# Patient Record
Sex: Female | Born: 1965 | Hispanic: No | Marital: Married | Smoking: Never smoker
Health system: Southern US, Community
[De-identification: ages and names within clinical notes are randomized; demographics above are authoritative.]

## PROBLEM LIST (undated history)

## (undated) ENCOUNTER — Emergency Department (HOSPITAL_COMMUNITY): Payer: BC Managed Care – PPO

## (undated) DIAGNOSIS — E785 Hyperlipidemia, unspecified: Secondary | ICD-10-CM

## (undated) DIAGNOSIS — F329 Major depressive disorder, single episode, unspecified: Secondary | ICD-10-CM

## (undated) DIAGNOSIS — F312 Bipolar disorder, current episode manic severe with psychotic features: Secondary | ICD-10-CM

## (undated) DIAGNOSIS — N390 Urinary tract infection, site not specified: Secondary | ICD-10-CM

## (undated) DIAGNOSIS — I890 Lymphedema, not elsewhere classified: Secondary | ICD-10-CM

## (undated) DIAGNOSIS — G4730 Sleep apnea, unspecified: Secondary | ICD-10-CM

## (undated) DIAGNOSIS — I517 Cardiomegaly: Secondary | ICD-10-CM

## (undated) DIAGNOSIS — F32A Depression, unspecified: Secondary | ICD-10-CM

## (undated) DIAGNOSIS — R7303 Prediabetes: Secondary | ICD-10-CM

## (undated) DIAGNOSIS — M1990 Unspecified osteoarthritis, unspecified site: Secondary | ICD-10-CM

## (undated) DIAGNOSIS — I739 Peripheral vascular disease, unspecified: Secondary | ICD-10-CM

## (undated) DIAGNOSIS — E213 Hyperparathyroidism, unspecified: Secondary | ICD-10-CM

## (undated) DIAGNOSIS — F419 Anxiety disorder, unspecified: Secondary | ICD-10-CM

## (undated) DIAGNOSIS — K5140 Inflammatory polyps of colon without complications: Secondary | ICD-10-CM

## (undated) DIAGNOSIS — B019 Varicella without complication: Secondary | ICD-10-CM

## (undated) DIAGNOSIS — E559 Vitamin D deficiency, unspecified: Secondary | ICD-10-CM

## (undated) DIAGNOSIS — H409 Unspecified glaucoma: Secondary | ICD-10-CM

## (undated) DIAGNOSIS — E079 Disorder of thyroid, unspecified: Secondary | ICD-10-CM

## (undated) DIAGNOSIS — E282 Polycystic ovarian syndrome: Secondary | ICD-10-CM

## (undated) DIAGNOSIS — K5792 Diverticulitis of intestine, part unspecified, without perforation or abscess without bleeding: Secondary | ICD-10-CM

## (undated) DIAGNOSIS — I10 Essential (primary) hypertension: Secondary | ICD-10-CM

## (undated) DIAGNOSIS — I499 Cardiac arrhythmia, unspecified: Secondary | ICD-10-CM

## (undated) DIAGNOSIS — D649 Anemia, unspecified: Secondary | ICD-10-CM

## (undated) DIAGNOSIS — K219 Gastro-esophageal reflux disease without esophagitis: Secondary | ICD-10-CM

## (undated) HISTORY — DX: Unspecified glaucoma: H40.9

## (undated) HISTORY — DX: Urinary tract infection, site not specified: N39.0

## (undated) HISTORY — DX: Major depressive disorder, single episode, unspecified: F32.9

## (undated) HISTORY — DX: Peripheral vascular disease, unspecified: I73.9

## (undated) HISTORY — DX: Cardiac arrhythmia, unspecified: I49.9

## (undated) HISTORY — DX: Bipolar disorder, current episode manic severe with psychotic features: F31.2

## (undated) HISTORY — DX: Hyperparathyroidism, unspecified: E21.3

## (undated) HISTORY — DX: Hyperlipidemia, unspecified: E78.5

## (undated) HISTORY — DX: Anxiety disorder, unspecified: F41.9

## (undated) HISTORY — PX: INNER EAR SURGERY: SHX679

## (undated) HISTORY — DX: Anemia, unspecified: D64.9

## (undated) HISTORY — DX: Prediabetes: R73.03

## (undated) HISTORY — PX: LAPAROSCOPIC REPAIR AND REMOVAL OF GASTRIC BAND: SHX5919

## (undated) HISTORY — PX: TONSILLECTOMY AND ADENOIDECTOMY: SUR1326

## (undated) HISTORY — DX: Diverticulitis of intestine, part unspecified, without perforation or abscess without bleeding: K57.92

## (undated) HISTORY — DX: Unspecified osteoarthritis, unspecified site: M19.90

## (undated) HISTORY — DX: Essential (primary) hypertension: I10

## (undated) HISTORY — DX: Lymphedema, not elsewhere classified: I89.0

## (undated) HISTORY — DX: Cardiomegaly: I51.7

## (undated) HISTORY — DX: Depression, unspecified: F32.A

## (undated) HISTORY — DX: Inflammatory polyps of colon without complications: K51.40

## (undated) HISTORY — DX: Disorder of thyroid, unspecified: E07.9

## (undated) HISTORY — DX: Varicella without complication: B01.9

## (undated) HISTORY — DX: Gastro-esophageal reflux disease without esophagitis: K21.9

## (undated) HISTORY — DX: Sleep apnea, unspecified: G47.30

## (undated) HISTORY — PX: OOPHORECTOMY: SHX86

## (undated) HISTORY — DX: Polycystic ovarian syndrome: E28.2

## (undated) HISTORY — DX: Vitamin D deficiency, unspecified: E55.9

## (undated) HISTORY — PX: CHOLECYSTECTOMY: SHX55

---

## 1990-05-01 DIAGNOSIS — I10 Essential (primary) hypertension: Secondary | ICD-10-CM | POA: Insufficient documentation

## 2012-08-28 DIAGNOSIS — M488X9 Other specified spondylopathies, site unspecified: Secondary | ICD-10-CM | POA: Insufficient documentation

## 2013-05-01 HISTORY — PX: BREAST BIOPSY: SHX20

## 2014-09-21 LAB — HM MAMMOGRAPHY

## 2017-04-09 ENCOUNTER — Institutional Professional Consult (permissible substitution): Payer: Self-pay | Admitting: Pulmonary Disease

## 2017-05-11 DIAGNOSIS — D649 Anemia, unspecified: Secondary | ICD-10-CM | POA: Insufficient documentation

## 2017-05-11 DIAGNOSIS — I499 Cardiac arrhythmia, unspecified: Secondary | ICD-10-CM | POA: Insufficient documentation

## 2017-05-11 DIAGNOSIS — K219 Gastro-esophageal reflux disease without esophagitis: Secondary | ICD-10-CM | POA: Insufficient documentation

## 2017-05-11 DIAGNOSIS — M542 Cervicalgia: Secondary | ICD-10-CM | POA: Insufficient documentation

## 2017-05-11 DIAGNOSIS — K5790 Diverticulosis of intestine, part unspecified, without perforation or abscess without bleeding: Secondary | ICD-10-CM | POA: Insufficient documentation

## 2017-05-11 DIAGNOSIS — E282 Polycystic ovarian syndrome: Secondary | ICD-10-CM | POA: Insufficient documentation

## 2017-05-11 DIAGNOSIS — G4733 Obstructive sleep apnea (adult) (pediatric): Secondary | ICD-10-CM | POA: Insufficient documentation

## 2017-05-11 DIAGNOSIS — R600 Localized edema: Secondary | ICD-10-CM | POA: Insufficient documentation

## 2017-05-11 DIAGNOSIS — Z9989 Dependence on other enabling machines and devices: Principal | ICD-10-CM

## 2017-05-11 DIAGNOSIS — E039 Hypothyroidism, unspecified: Secondary | ICD-10-CM | POA: Insufficient documentation

## 2017-05-11 DIAGNOSIS — J309 Allergic rhinitis, unspecified: Secondary | ICD-10-CM | POA: Insufficient documentation

## 2017-05-11 HISTORY — DX: Morbid (severe) obesity due to excess calories: E66.01

## 2017-05-13 ENCOUNTER — Encounter: Payer: Self-pay | Admitting: Pulmonary Disease

## 2017-05-14 ENCOUNTER — Encounter: Payer: Self-pay | Admitting: Pulmonary Disease

## 2017-05-14 ENCOUNTER — Ambulatory Visit: Payer: BLUE CROSS/BLUE SHIELD | Admitting: Pulmonary Disease

## 2017-05-14 DIAGNOSIS — Z9989 Dependence on other enabling machines and devices: Secondary | ICD-10-CM | POA: Diagnosis not present

## 2017-05-14 DIAGNOSIS — I10 Essential (primary) hypertension: Secondary | ICD-10-CM | POA: Diagnosis not present

## 2017-05-14 DIAGNOSIS — G4733 Obstructive sleep apnea (adult) (pediatric): Secondary | ICD-10-CM | POA: Diagnosis not present

## 2017-05-14 NOTE — Progress Notes (Signed)
Subjective:    Patient ID: Madison Palmer, female    DOB: 11-Oct-1965, 52 y.o.   MRN: [id number]  HPI  52 year old morbidly obese woman presents to establish care for obstructive sleep apnea. She was diagnosed with severe OSA in [place] in 1992. NP SG 2008 showed AHI of 100/hour which was corrected by CPAP of 14 cm.  She underwent lap band in 2009 and lost some weight and CPAP was then decreased to 9 cm in 06/2009.  However she had severe problems with tolerating food and lap band was removed in 2013.  She has gained weight back since then.  She has tried other measures such as weight watchers but remains morbidly obese.  Epworth sleepiness score is 5 and she denies excessive daytime somnolence. Bedtime is between midnight and 1 AM, sleep latency is 15-20 minutes, she sleeps on her right side with 2 pillows.  TV stays on through the night, she is perimenopausal and reports 1-2 nocturnal awakenings without nocturia and is out of bed at 8:30 AM feeling rested without dryness of mouth or headaches with her nasal CPAP. She has no problems with mask or pressure, DME is [facility].  She was last seen by her MD and 11 10/2016.  She has moved to [place] in 2016. She has hypertension maintained on 2 medications and is not been able to establish with PCP and is noted to be hypertensive today. She also has chronic left lower extremity lymphedema for which she has used a pump with good results CPAP download was reviewed which shows excellent compliance and no residual events on 9 cm with minimal leak   No past medical history on file.  Past surgical history-tonsillectomy as a child, lap band 2009  Allergies  Allergen Reactions  . Fentanyl Hives  . Levofloxacin Hives  . Midazolam Hives  . Pollen Extract     seasonal  . Atorvastatin     Muscle pain in legs   . Rosuvastatin     Abdominal pain     Social History   Socioeconomic History  . Marital status: Married    Spouse name: Not on  file  . Number of children: Not on file  . Years of education: Not on file  . Highest education level: Not on file  Social Needs  . Financial resource strain: Not on file  . Food insecurity - worry: Not on file  . Food insecurity - inability: Not on file  . Transportation needs - medical: Not on file  . Transportation needs - non-medical: Not on file  Occupational History  . Not on file  Tobacco Use  . Smoking status: Never Smoker  . Smokeless tobacco: Never Used  Substance and Sexual Activity  . Alcohol use: Yes  . Drug use: No  . Sexual activity: Not on file  Other Topics Concern  . Not on file  Social History Narrative  . Not on file     History reviewed. No pertinent family history. Adopted. 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 Also denies presyncope, palpitations, heartburn, abdominal pain, nausea, vomiting, diarrhea or change in bowel or urinary habits, dysuria,hematuria, rash, arthralgias, visual complaints, headache, numbness weakness or ataxia.  Objective:   Physical Exam  Gen. Pleasant, obese, in no distress, normal affect ENT - no lesions, no post nasal drip, class 2-3 airway Neck: No JVD, no thyromegaly, no carotid bruits Lungs: no use of accessory muscles, no dullness to percussion, decreased without rales or rhonchi  Cardiovascular: Rhythm regular, heart sounds  normal, no murmurs or gallops, 2+ left LE peripheral edema Abdomen: soft and non-tender, no hepatosplenomegaly, BS normal. Musculoskeletal: No deformities, no cyanosis or clubbing Neuro:  alert, non focal, no tremors       Assessment & Plan:

## 2017-05-14 NOTE — Patient Instructions (Signed)
Prescription for new CPAP 9 cm and CPAP supplies will be sent to [facility]  We will try to get your new internist with [place] at [facility] or [employer]

## 2017-05-14 NOTE — Assessment & Plan Note (Signed)
Prescription for new CPAP 9 cm and CPAP supplies will be sent to [facility]  Weight loss encouraged, compliance with goal of at least 4-6 hrs every night is the expectation. Advised against medications with sedative side effects Cautioned against driving when sleepy - understanding that sleepiness will vary on a day to day basis

## 2017-05-14 NOTE — Assessment & Plan Note (Signed)
Blood pressure high today We will try to get your new internist with [place] at [facility] or [employer]

## 2017-05-15 ENCOUNTER — Ambulatory Visit: Payer: BLUE CROSS/BLUE SHIELD | Admitting: Nurse Practitioner

## 2017-05-15 ENCOUNTER — Encounter: Payer: Self-pay | Admitting: Nurse Practitioner

## 2017-05-15 VITALS — BP 160/94 | HR 84 | Temp 97.6°F | Ht 63.0 in | Wt 329.0 lb

## 2017-05-15 DIAGNOSIS — K219 Gastro-esophageal reflux disease without esophagitis: Secondary | ICD-10-CM | POA: Diagnosis not present

## 2017-05-15 DIAGNOSIS — E039 Hypothyroidism, unspecified: Secondary | ICD-10-CM | POA: Diagnosis not present

## 2017-05-15 DIAGNOSIS — R600 Localized edema: Secondary | ICD-10-CM

## 2017-05-15 DIAGNOSIS — I10 Essential (primary) hypertension: Secondary | ICD-10-CM | POA: Diagnosis not present

## 2017-05-15 DIAGNOSIS — Z23 Encounter for immunization: Secondary | ICD-10-CM | POA: Diagnosis not present

## 2017-05-15 DIAGNOSIS — I890 Lymphedema, not elsewhere classified: Secondary | ICD-10-CM | POA: Insufficient documentation

## 2017-05-15 DIAGNOSIS — H6981 Other specified disorders of Eustachian tube, right ear: Secondary | ICD-10-CM | POA: Diagnosis not present

## 2017-05-15 DIAGNOSIS — Z86010 Personal history of colonic polyps: Secondary | ICD-10-CM | POA: Insufficient documentation

## 2017-05-15 NOTE — Patient Instructions (Addendum)
She will continue f/up with Dr. Benjamine Mola (ENT) for right ear pain.  Please sign medical release to get records from previous PCP.  Sign separate medical release form to get records from records from endocrinology.  You will be called to schedule appt with endocrinology and [facility] (with [place]).  Check BP once a day or three time a week and record. Bring BP records to next office visit.

## 2017-05-15 NOTE — Progress Notes (Signed)
Subjective:  Patient ID: Madison Palmer, female    DOB: 10/10/1965  Age: 52 y.o. MRN: [id number]  CC: Establish Care (est care/lymphedema consult and referral /ear problem--need second opinion from ENT. flu shot)   HPI  Moved from [place] to [place] in 2016. Last seen by previous pcp 72yrs ago.  Sleep Apnea: evaluated by pulmonology: Dr. Elsworth Soho Current use of CPAP.  GERD and colon polyps: last colonoscopy 2016, benign polyps per patient. GERD: symptoms controlled with current use on nexium and zantac.  Hypothyroidism, PCOS, hx of thyroid polyps: Managed by endocrinologist: Dr. Maurine Simmering in [place].  Last seen 12/2016. Will like referral to local endocrinology.  HTN:   Previously managed by cardiologist Dr. Lissa Hoard in [place]. Current use of hydralazine, HCTZ, and atenolol Home reading: 140s/80s -150s/90s (11/2016-12/2016).  Chronic ear pain: Right of right side. Recently evaluated by ENT: Dr. Raylene Miyamoto; who recommended insertion of ear tubes  Hx of Lymphedema: Previous vascular provider: Dr.Turtiff in [place] seen 2years ago. Previous treatment: compression wraps, pneumotic pumps at home and manual massage. Will like referral to local lymphedema clinic for possible compression wraps. Complains of acute on chronic leg pain and swelling. Use compression pumps at home with no improvement. Denies any leg injury, recent travel, fever, redness, pain with ambulation.  Outpatient Medications Prior to Visit  Medication Sig Dispense Refill  . atenolol (TENORMIN) 25 MG tablet TK 1 T PO D  0  . esomeprazole (NEXIUM) 40 MG capsule TK ONE C PO  D  1  . fluticasone (FLONASE) 50 MCG/ACT nasal spray Place 2 sprays into both nostrils daily.    . hydrALAZINE (APRESOLINE) 50 MG tablet TK 1 T PO BID  3  . hydrochlorothiazide (HYDRODIURIL) 50 MG tablet Take 50 mg by mouth daily.  3  . ibuprofen (ADVIL,MOTRIN) 600 MG tablet Take 600 mg by mouth every 6 (six) hours as needed.    Marland Kitchen  levothyroxine (SYNTHROID, LEVOTHROID) 75 MCG tablet TK 1 T PO QD  MONDAY-FRIDAY  0  . ranitidine (ZANTAC) 150 MG tablet Take by mouth.     No facility-administered medications prior to visit.     ROS Review of Systems  Constitutional: Negative.   HENT: Positive for ear pain and tinnitus. Negative for ear discharge and hearing loss.   Respiratory: Negative.   Cardiovascular: Positive for leg swelling. Negative for chest pain and orthopnea.  Gastrointestinal: Positive for heartburn. Negative for abdominal pain, blood in stool, constipation, melena and nausea.  Musculoskeletal: Positive for joint pain. Negative for falls.  Skin: Negative.   Neurological: Negative.   Psychiatric/Behavioral: Negative.     Objective:  BP (!) 160/94   Pulse 84   Temp 97.6 F (36.4 C)   Ht 5\' 3"  (1.6 m)   Wt (!) 329 lb (149.2 kg)   SpO2 95%   BMI 58.28 kg/m   BP Readings from Last 3 Encounters:  05/15/17 (!) 160/94  05/14/17 (!) 162/98    Wt Readings from Last 3 Encounters:  05/15/17 (!) 329 lb (149.2 kg)  05/14/17 (!) 338 lb (153.3 kg)    Physical Exam  Constitutional: She is oriented to person, place, and time. No distress.  HENT:  Right Ear: External ear and ear canal normal. No tenderness. No mastoid tenderness. Tympanic membrane is not injected and not erythematous. A middle ear effusion is present.  Left Ear: Tympanic membrane, external ear and ear canal normal. No tenderness. No mastoid tenderness. Tympanic membrane is not injected and  not erythematous.  No middle ear effusion.  Neck: No thyromegaly present.  Cardiovascular: Normal rate, regular rhythm and normal heart sounds.  Pulmonary/Chest: Effort normal and breath sounds normal.  Musculoskeletal: She exhibits edema. She exhibits no tenderness.  Neurological: She is alert and oriented to person, place, and time.  Skin: Skin is warm and dry. No rash noted. No erythema.  Psychiatric: She has a normal mood and affect. Her behavior  is normal.  Vitals reviewed.   No results found for: WBC, HGB, HCT, PLT, GLUCOSE, CHOL, TRIG, HDL, LDLDIRECT, LDLCALC, ALT, AST, NA, K, CL, CREATININE, BUN, CO2, TSH, PSA, INR, GLUF, HGBA1C, MICROALBUR  Patient was never admitted.  Assessment & Plan:   Madison Palmer was seen today for establish care.  Diagnoses and all orders for this visit:  Bilateral leg edema -     Ambulatory referral to Occupational Therapy  Essential hypertension  Gastroesophageal reflux disease without esophagitis  Hypothyroidism, unspecified type -     Ambulatory referral to Endocrinology  Eustachian tube dysfunction, right  Need for influenza vaccination -     Flu Vaccine QUAD 36+ mos IM   I am having Madison Palmer maintain her atenolol, esomeprazole, hydrALAZINE, levothyroxine, ibuprofen, fluticasone, hydrochlorothiazide, and ranitidine.  No orders of the defined types were placed in this encounter.   Follow-up: Return in about 4 weeks (around 06/12/2017) for CPE (fasting) and re eval of BP.  Wilfred Lacy, NP

## 2017-05-21 ENCOUNTER — Encounter: Payer: Self-pay | Admitting: Nurse Practitioner

## 2017-05-21 NOTE — Progress Notes (Signed)
Abstracted result and sent to scan  

## 2017-05-22 ENCOUNTER — Telehealth: Payer: Self-pay | Admitting: Pulmonary Disease

## 2017-05-22 ENCOUNTER — Ambulatory Visit: Payer: BLUE CROSS/BLUE SHIELD | Attending: Nurse Practitioner | Admitting: Occupational Therapy

## 2017-05-22 ENCOUNTER — Encounter: Payer: Self-pay | Admitting: Occupational Therapy

## 2017-05-22 ENCOUNTER — Other Ambulatory Visit: Payer: Self-pay

## 2017-05-22 DIAGNOSIS — I890 Lymphedema, not elsewhere classified: Secondary | ICD-10-CM | POA: Insufficient documentation

## 2017-05-22 NOTE — Telephone Encounter (Signed)
Called [employer] and advised that we do not have 1992 baseline sleep study, but advised that we have a 2006 split night study.  This is sufficient per rep.  Faxed copy to [phone], account # [id number].  Nothing further needed.

## 2017-05-28 ENCOUNTER — Ambulatory Visit: Payer: BLUE CROSS/BLUE SHIELD | Admitting: Occupational Therapy

## 2017-05-28 DIAGNOSIS — I890 Lymphedema, not elsewhere classified: Secondary | ICD-10-CM

## 2017-05-28 NOTE — Therapy (Deleted)
[place] MAIN [facility] SERVICES [address] [place], [place], [address] Phone: [phone]   Fax:  [phone]  Occupational Therapy Treatment  Patient Details  Name: Madison Palmer MRN: [id number] Date of Birth: 1966-02-09 Referring Provider: Debbora Dus, MD   Encounter Date: 05/22/2017  OT End of Session - 05/28/17 0946    Visit Number  1    Number of Visits  36    Date for OT Re-Evaluation  08/20/17    OT Start Time  0105    OT Stop Time  0209    OT Time Calculation (min)  64 min       Past Medical History:  Diagnosis Date  . Arrhythmia    heart  . Arthritis   . Chickenpox   . Depression   . Diverticulitis   . GERD (gastroesophageal reflux disease)   . Glaucoma   . Headache   . Hyperlipidemia   . Hypertension   . Inflammatory polyps of colon ([place])   . Thyroid disease   . UTI (urinary tract infection)     Past Surgical History:  Procedure Laterality Date  . BREAST BIOPSY  2015  . CESAREAN SECTION  2004  . INNER EAR SURGERY     ear and sinus surgery  . OOPHORECTOMY Left   . TONSILLECTOMY AND ADENOIDECTOMY      There were no vitals filed for this visit.      [facility] OT Assessment - 05/28/17 0001      Assessment   Referring Provider  Debbora Dus, MD    Onset Date/Surgical Date  05/01/02    Hand Dominance  Right    Prior Therapy  CDT w/ wraps and MLD      Precautions   Precautions  Other (comment) HYPOTHROID- NO neck CDT      Restrictions   Other Position/Activity Restrictions  none      Home  Environment   Lives With  Alone      IADL   Shopping  Takes care of all shopping needs independently    Light Housekeeping  Does personal laundry completely    Meal Prep  Plans, prepares and serves adequate meals independently    Investment banker, corporate own vehicle    Medication Management  Is responsible for taking medication in correct dosages at correct time    Psychiatrist  financial matters independently (budgets, writes checks, pays rent, bills goes to bank), collects and keeps track of income      Mobility   Mobility Status Comments  difficulty lifting legs to climb stairs and complete car transfers      Activity Tolerance   Activity Tolerance  Endurance does not limit participation in activity    Activity Tolerance Comments  decreased standing and walking tolerance 2/2 chronic BLE LE and associated pain      Cognition   Overall Cognitive Status  Within Functional Limits for tasks assessed      Posture/Postural Control   Posture/Postural Control  No significant limitations      Sensation   Light Touch  Appears Intact      Coordination   Gross Motor Movements are Fluid and Coordinated  Yes    Fine Motor Movements are Fluid and Coordinated  Yes      ROM / Strength   AROM / PROM / Strength  AROM;Strength [facility]  OT Treatments/Exercises (OP) - 05/28/17 0001      ADLs   ADL Education Given  Yes      Manual Therapy   Manual Therapy  Edema management    Manual therapy comments  BLE comparative limb volumetrics TBA at first OT treatment visit. L>R estimated at ~ 10-15% by visual assessment   below the knee today                  OT Long Term Goals - 05/22/17 0951      OT LONG TERM GOAL #1   Title  Pt modified independent w/ lymphedema precautions and prevention principals and strategies using printed reference to limit LE progression and infection risk.    Baseline  Max A    Time  2    Period  Weeks    Status  New    Target Date  -- 10th OT visit      OT LONG TERM GOAL #2   Title  Lymphedema (LE) management/ self-care: Pt able to apply knee length, multi layered, gradient compression wraps with minimal assistance using proper techniques within 2 weeks to achieve optimal limb volume reductions in preparation for fitting compression garments/ devices bilaterally.    Baseline  Max A    Time  2    Period  Weeks     Status  New    Target Date  -- 10th OT visit      OT LONG TERM GOAL #3   Title  Lymphedema (LE) management/ self-care:  Pt to achieve at least 10%  BLE limb volume reductions  during Intensive Phase CDT to limit LE progression, to reduce pain, to improve ADLs performance.    Baseline  Max A    Time  12    Period  Weeks    Status  New    Target Date  08/20/17      OT LONG TERM GOAL #4   Title  Lymphedema (LE) management/ self-care:  Pt to tolerate daily compression wraps, compression garments and/ or HOS devices in keeping w/ prescribed wear regime within 1 week of issue date of each to progress and retain clinical and functional gains and to limit LE progression.    Baseline  Max A    Time  12    Period  Weeks    Status  New    Target Date  08/20/17      OT LONG TERM GOAL #5   Title  Lymphedema (LE) self-management/ self-care:  During Management Phase CDT Pt to sustain current limb volumes within 5% using LE self-care skills and strategies learned during Intensive Phase OT independently to control limb swelling and associated pain, to  limit LE progression, to decrease infection risk and to limit further functional decline.    Baseline  Max A    Time  6    Period  Months    Status  New    Target Date  11/24/17              Patient will benefit from skilled therapeutic intervention in order to improve the following deficits and impairments:  Decreased knowledge of use of DME, Decreased skin integrity, Increased edema, Impaired flexibility, Pain, Decreased mobility, Decreased activity tolerance, Decreased range of motion, Decreased knowledge of precautions, Difficulty walking, Obesity  Visit Diagnosis: Lymphedema, not elsewhere classified - Plan: Ot plan of care cert/re-cert    Problem List Patient Active Problem List   Diagnosis Date Noted  .  Hx of colonic polyps 05/15/2017  . Eustachian tube dysfunction, right 05/15/2017  . Lymphatic edema 05/15/2017  . Hypertension  05/14/2017  . Anemia 05/11/2017  . Arrhythmia 05/11/2017  . Diverticulosis 05/11/2017  . GERD (gastroesophageal reflux disease) 05/11/2017  . Hypothyroidism 05/11/2017  . Lower extremity edema 05/11/2017  . Morbid obesity ([place]) 05/11/2017  . Polycystic ovary syndrome 05/11/2017  . OSA on CPAP 05/11/2017  . Neck pain 05/11/2017  . Allergic rhinitis 05/11/2017  . 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 [place], [place], [address] Phone: [phone]   Fax:  [phone]  Name: LANA FLAIM MRN: [id number] Date of Birth: August 08, 1965

## 2017-05-28 NOTE — Therapy (Signed)
[place] MAIN [facility] SERVICES [address] [place], [place], [address] Phone: [phone]   Fax:  [phone]  Occupational Therapy Treatment  Patient Details  Name: Madison Palmer MRN: [id number] Date of Birth: 11/12/1965 Referring Provider: Debbora Dus, MD   Encounter Date: 05/28/2017  OT End of Session - 05/28/17 1636    Visit Number  2    Number of Visits  36    Date for OT Re-Evaluation  08/20/17    OT Start Time  0215    OT Stop Time  0315    OT Time Calculation (min)  60 min       Past Medical History:  Diagnosis Date  . Arrhythmia    heart  . Arthritis   . Chickenpox   . Depression   . Diverticulitis   . GERD (gastroesophageal reflux disease)   . Glaucoma   . Headache   . Hyperlipidemia   . Hypertension   . Inflammatory polyps of colon ([place])   . Thyroid disease   . UTI (urinary tract infection)     Past Surgical History:  Procedure Laterality Date  . BREAST BIOPSY  2015  . CESAREAN SECTION  2004  . INNER EAR SURGERY     ear and sinus surgery  . OOPHORECTOMY Left   . TONSILLECTOMY AND ADENOIDECTOMY      There were no vitals filed for this visit.  Subjective Assessment - 05/28/17 1623    Subjective   Pt presents for OT visit 2/36 to for Intensive Phase CDT to  BLE. Pt in agreement with plan to complete BLE comparative limb volumetrics to gather baseline volume date today. Pt has no new events since initial eval her condition is unchanged.    Pertinent History  onset BLE LE 2004 s/p C section, essential HTN, GERD, Hypothyroidism, Obesity; OSA ( compliant w/ C-Pap) Lap Band sx s/p 2009 Lap banc removed 2013, OA    Limitations  limited hip AROM- unable to reach feet and distal legs to don socks, compression garments, lace up street shoews, decreased standing and walking tolerance 2/2 leg swelling and associated pain,     Patient Stated Goals  decrease leg edema and get it under better control; explore  alternative compression garments/ devices    Currently in Pain?  Yes chronic OA pain unchanged.     Pain Score  -- not rated    Pain Onset  Other (comment) chronic         [facility] OT Assessment - 05/28/17 0001      Assessment   Referring Provider  Debbora Dus, MD    Onset Date/Surgical Date  05/01/02    Hand Dominance  Right    Prior Therapy  CDT w/ wraps and MLD      Precautions   Precautions  Other (comment) HYPOTHROID- NO neck CDT      Restrictions   Other Position/Activity Restrictions  none      Home  Environment   Lives With  Alone      IADL   Shopping  Takes care of all shopping needs independently    Light Housekeeping  Does personal laundry completely    Meal Prep  Plans, prepares and serves adequate meals independently    [place] own vehicle    Medication Management  Is responsible for taking medication in correct dosages at correct time    Psychiatrist financial matters independently (budgets, writes checks,  pays rent, bills goes to bank), collects and keeps track of income      Mobility   Mobility Status Comments  difficulty lifting legs to climb stairs and complete car transfers      Activity Tolerance   Activity Tolerance  Endurance does not limit participation in activity    Activity Tolerance Comments  decreased standing and walking tolerance 2/2 chronic BLE LE and associated pain      Cognition   Overall Cognitive Status  Within Functional Limits for tasks assessed      Posture/Postural Control   Posture/Postural Control  No significant limitations      Sensation   Light Touch  Appears Intact      Coordination   Gross Motor Movements are Fluid and Coordinated  Yes    Fine Motor Movements are Fluid and Coordinated  Yes      ROM / Strength   AROM / PROM / Strength  AROM;Strength [facility]      LYMPHEDEMA/ONCOLOGY QUESTIONNAIRE - 05/28/17 1628      Lymphedema Assessments   Lymphedema Assessments  Lower  extremities      Right Lower Extremity Lymphedema   Other  RLE ankle to below knee (A-D) limb volume = 5696.25 ml.  RLE knee to groibn (E-G) limb volume = 8117.38 ml. RLE full leg from ankle to groin (A-G) limb volume = 13813.63 ml.    Other  Baseline comparative limb volumetrics reveal  limb volume differential at A-D segments measure 17.87%, L>R. LVD at E-G segments measures 25.71%, R>L. LVD for full legs measures 7.74%, R>>L      Left Lower Extremity Lymphedema   Other  LLE ankle to below knee (A-D) limb volume = 6713.90 ml.  LLE knee to groibn (E-G) limb volume = 6030.4 ml. LLE full leg from ankle to groin (A-G) limb volume = 12744.29 ml.              OT Treatments/Exercises (OP) - 05/28/17 1625      ADLs   ADL Education Given  Yes      Manual Therapy   Manual Therapy  Edema management;Compression Bandaging    Manual therapy comments  Completed baseline BLE comparative limb voplumetrics    Edema Management  commenced CDT to LLE initially today. Once compression garment isd fitted CDT will shift to RLE    Compression Bandaging  Single layer 0.4 cm Rosidal foam over  knee length cotton stockinett.; 8 cm, 10 cm and 12 cm short stretchh wraps applied in succession from smallest to largest in gradient compression configuration  from foot to below knee.                  OT Long Term Goals - 05/22/17 0951      OT LONG TERM GOAL #1   Title  Pt modified independent w/ lymphedema precautions and prevention principals and strategies using printed reference to limit LE progression and infection risk.    Baseline  Max A    Time  2    Period  Weeks    Status  New    Target Date  -- 10th OT visit      OT LONG TERM GOAL #2   Title  Lymphedema (LE) management/ self-care: Pt able to apply knee length, multi layered, gradient compression wraps with minimal assistance using proper techniques within 2 weeks to achieve optimal limb volume reductions in preparation for fitting  compression garments/ devices bilaterally.    Baseline  Max  A    Time  2    Period  Weeks    Status  New    Target Date  -- 10th OT visit      OT LONG TERM GOAL #3   Title  Lymphedema (LE) management/ self-care:  Pt to achieve at least 10%  BLE limb volume reductions  during Intensive Phase CDT to limit LE progression, to reduce pain, to improve ADLs performance.    Baseline  Max A    Time  12    Period  Weeks    Status  New    Target Date  08/20/17      OT LONG TERM GOAL #4   Title  Lymphedema (LE) management/ self-care:  Pt to tolerate daily compression wraps, compression garments and/ or HOS devices in keeping w/ prescribed wear regime within 1 week of issue date of each to progress and retain clinical and functional gains and to limit LE progression.    Baseline  Max A    Time  12    Period  Weeks    Status  New    Target Date  08/20/17      OT LONG TERM GOAL #5   Title  Lymphedema (LE) self-management/ self-care:  During Management Phase CDT Pt to sustain current limb volumes within 5% using LE self-care skills and strategies learned during Intensive Phase OT independently to control limb swelling and associated pain, to  limit LE progression, to decrease infection risk and to limit further functional decline.    Baseline  Max A    Time  6    Period  Months    Status  New    Target Date  11/24/17            Plan - 05/28/17 1634    Clinical Impression Statement  Baseline comparative limb volumetrics reveal  limb volume differential at A-D segments measure 17.87%, L>R. LVD at E-G segments measures 25.71%, R>L. LVD for full legs measures 7.74%, R>>L. Pt tolerated initial compression wraps below the knee on the L without difficulty. Cont to refine bandaging application for optimal comfort and effectiveness. Pt will discuss recruiting compression  wrap help from family members between visits. Pt understands that prognosis without ongoing and cosistent compression wraps during  visit intervals is cruical for successful swelling reduction.    Occupational performance deficits (Please refer to evaluation for details):  ADL's;IADL's;Work;Leisure;Social Participation;Other body image    Rehab Potential  Good    OT Frequency  3x / week    OT Duration  12 weeks    OT Treatment/Interventions  Self-care/ADL training;DME and/or AE instruction;Manual Therapy;Patient/family education;Manual lymph drainage;Therapeutic exercise;Compression bandaging;Therapeutic activities;Other (comment) skin care w/ low ph castor oil and/ or lotion during MLD    Recommended Other Services  fit with appropriate BLE compression garments, HOSD devices that are comfortable and Pt is able to don and doff using assistive devices PRN    Consulted and Agree with Plan of Care  Patient       Patient will benefit from skilled therapeutic intervention in order to improve the following deficits and impairments:  Decreased knowledge of use of DME, Decreased skin integrity, Increased edema, Impaired flexibility, Pain, Decreased mobility, Decreased activity tolerance, Decreased range of motion, Decreased knowledge of precautions, Difficulty walking, Obesity  Visit Diagnosis: Lymphedema, not elsewhere classified    Problem List Patient Active Problem List   Diagnosis Date Noted  . Hx of colonic polyps 05/15/2017  . Eustachian tube  dysfunction, right 05/15/2017  . Lymphatic edema 05/15/2017  . Hypertension 05/14/2017  . Anemia 05/11/2017  . Arrhythmia 05/11/2017  . Diverticulosis 05/11/2017  . GERD (gastroesophageal reflux disease) 05/11/2017  . Hypothyroidism 05/11/2017  . Lower extremity edema 05/11/2017  . Morbid obesity ([place]) 05/11/2017  . Polycystic ovary syndrome 05/11/2017  . OSA on CPAP 05/11/2017  . Neck pain 05/11/2017  . Allergic rhinitis 05/11/2017  . 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 [place], [place], [address] Phone: [phone]   Fax:  [phone]  Name: DIONISIA PACHOLSKI MRN: [id number] Date of Birth: 08-23-1965

## 2017-05-28 NOTE — Patient Instructions (Signed)

## 2017-05-28 NOTE — Therapy (Signed)
[place] MAIN [facility] SERVICES [address] [place], [place], [address] Phone: [phone]   Fax:  [phone]  Occupational Therapy Evaluation  Patient Details  Name: Madison Palmer MRN: [id number] Date of Birth: [birth year] Referring Provider: Debbora Dus, MD   Encounter Date: 05/22/2017  OT End of Session - 05/28/17 0946    Visit Number  1    Number of Visits  36    Date for OT Re-Evaluation  08/20/17    OT Start Time  0105    OT Stop Time  0209    OT Time Calculation (min)  64 min       Past Medical History:  Diagnosis Date  . Arrhythmia    heart  . Arthritis   . Chickenpox   . Depression   . Diverticulitis   . GERD (gastroesophageal reflux disease)   . Glaucoma   . Headache   . Hyperlipidemia   . Hypertension   . Inflammatory polyps of colon ([place])   . Thyroid disease   . UTI (urinary tract infection)     Past Surgical History:  Procedure Laterality Date  . BREAST BIOPSY  2015  . CESAREAN SECTION  2004  . INNER EAR SURGERY     ear and sinus surgery  . OOPHORECTOMY Left   . TONSILLECTOMY AND ADENOIDECTOMY      There were no vitals filed for this visit.     [facility] OT Assessment - 05/28/17 0001      Assessment   Referring Provider  Debbora Dus, MD    Onset Date/Surgical Date  05/01/02    Hand Dominance  Right    Prior Therapy  CDT w/ wraps and MLD      Precautions   Precautions  Other (comment) HYPOTHROID- NO neck CDT      Restrictions   Other Position/Activity Restrictions  none      Home  Environment   Lives With  Alone      IADL   Shopping  Takes care of all shopping needs independently    Light Housekeeping  Does personal laundry completely    Meal Prep  Plans, prepares and serves adequate meals independently    Investment banker, corporate own vehicle    Medication Management  Is responsible for taking medication in correct dosages at correct time    Psychiatrist  financial matters independently (budgets, writes checks, pays rent, bills goes to bank), collects and keeps track of income      Mobility   Mobility Status Comments  difficulty lifting legs to climb stairs and complete car transfers      Activity Tolerance   Activity Tolerance  Endurance does not limit participation in activity    Activity Tolerance Comments  decreased standing and walking tolerance 2/2 chronic BLE LE and associated pain      Cognition   Overall Cognitive Status  Within Functional Limits for tasks assessed      Posture/Postural Control   Posture/Postural Control  No significant limitations      Sensation   Light Touch  Appears Intact      Coordination   Gross Motor Movements are Fluid and Coordinated  Yes    Fine Motor Movements are Fluid and Coordinated  Yes      ROM / Strength   AROM / PROM / Strength  AROM;Strength WFL         Mild-Moderate, Stage II, BLE lymphedema, L>R, secondary  to CVI  Skin Description Hyper-Keratosis Peau' de Orange Shiny Tight Fibrotic Fatty Doughy Indurated      x Dense; moderate,  distribution  below knees L>R      Hydration Dry Flaky Erythema Other   mildly      Color Redness Present Pallor Blanching Hemosiderin Staining Other      Very Slight at distal legs    Odor Malodorous Yeast Present Absent      x   Temperature Warm Cool Normal    x    Pitting Edema  NON PITTING   1+ 2+ 3+ 4+ >4            Girth Symmetrical Asymmetrical Other Distribution    L>R    Stemmer Sign Positive Negative   Strong + BLE    Lymphorrea History Of:  Present Absent     Absent , but ndorses + hx    Wounds History Of Present Absent Venous Arterial Pressure Size      x          Signs of Infection Redness Warmth Erythema Acute Swelling Drainage absent                x   Scars Adhesions Hypersensitivity        Sensation Light Touch Deep pressure Hypersensitivty   Present Impaired Present Impaired Present Impaired   WNL    WNL- tender  WNL   x  Nails WNL Fungus Present Other     Dry, thickened nails   Hair Growth Symmetrical Asymmetrical   x    Skin Creases Base pf toes Lobules Base of Fingers       present          OT Treatments/Exercises (OP) - 05/28/17 0001      ADLs   ADL Education Given  Yes      Manual Therapy   Manual Therapy  Edema management    Manual therapy comments  BLE comparative limb volumetrics TBA at first OT treatment visit. L>R estimated at ~ 10-15% by visual assessment   below the knee today        OT Long Term Goals - 05/22/17 0951      OT LONG TERM GOAL #1   Title  Pt modified independent w/ lymphedema precautions and prevention principals and strategies using printed reference to limit LE progression and infection risk.    Baseline  Max A    Time  2    Period  Weeks    Status  New    Target Date  -- 10th OT visit      OT LONG TERM GOAL #2   Title  Lymphedema (LE) management/ self-care: Pt able to apply knee length, multi layered, gradient compression wraps with minimal assistance using proper techniques within 2 weeks to achieve optimal limb volume reductions in preparation for fitting compression garments/ devices bilaterally.    Baseline  Max A    Time  2    Period  Weeks    Status  New    Target Date  -- 10th OT visit      OT LONG TERM GOAL #3   Title  Lymphedema (LE) management/ self-care:  Pt to achieve at least 10%  BLE limb volume reductions  during Intensive Phase CDT to limit LE progression, to reduce pain, to improve ADLs performance.    Baseline  Max A    Time  12    Period  Weeks  Status  New    Target Date  08/20/17      OT LONG TERM GOAL #4   Title  Lymphedema (LE) management/ self-care:  Pt to tolerate daily compression wraps, compression garments and/ or HOS devices in keeping w/ prescribed wear regime within 1 week of issue date of each to progress and retain clinical and functional gains and to limit LE progression.    Baseline  Max A     Time  12    Period  Weeks    Status  New    Target Date  08/20/17      OT LONG TERM GOAL #5   Title  Lymphedema (LE) self-management/ self-care:  During Management Phase CDT Pt to sustain current limb volumes within 5% using LE self-care skills and strategies learned during Intensive Phase OT independently to control limb swelling and associated pain, to  limit LE progression, to decrease infection risk and to limit further functional decline.    Baseline  Max A    Time  6    Period  Months    Status  New    Target Date  11/24/17              Patient will benefit from skilled therapeutic intervention in order to improve the following deficits and impairments:  Decreased knowledge of use of DME, Decreased skin integrity, Increased edema, Impaired flexibility, Pain, Decreased mobility, Decreased activity tolerance, Decreased range of motion, Decreased knowledge of precautions, Difficulty walking, Obesity  Visit Diagnosis: Lymphedema, not elsewhere classified - Plan: Ot plan of care cert/re-cert    Problem List Patient Active Problem List   Diagnosis Date Noted  . Hx of colonic polyps 05/15/2017  . Eustachian tube dysfunction, right 05/15/2017  . Lymphatic edema 05/15/2017  . Hypertension 05/14/2017  . Anemia 05/11/2017  . Arrhythmia 05/11/2017  . Diverticulosis 05/11/2017  . GERD (gastroesophageal reflux disease) 05/11/2017  . Hypothyroidism 05/11/2017  . Lower extremity edema 05/11/2017  . Morbid obesity ([place]) 05/11/2017  . Polycystic ovary syndrome 05/11/2017  . OSA on CPAP 05/11/2017  . Neck pain 05/11/2017  . Allergic rhinitis 05/11/2017  . 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 [place], [place], [address] Phone: [phone]   Fax:  [phone]  Name: Madison Palmer MRN: [id number] Date of  Birth: 10-Nov-1965

## 2017-05-29 ENCOUNTER — Ambulatory Visit: Payer: BLUE CROSS/BLUE SHIELD | Admitting: Occupational Therapy

## 2017-05-29 DIAGNOSIS — I890 Lymphedema, not elsewhere classified: Secondary | ICD-10-CM | POA: Diagnosis not present

## 2017-05-29 LAB — CBC AND DIFFERENTIAL
HEMATOCRIT: 44 (ref 36–46)
HEMOGLOBIN: 15.3 (ref 12.0–16.0)
Neutrophils Absolute: 4
Platelets: 257 (ref 150–399)
WBC: 6.3

## 2017-05-29 LAB — BASIC METABOLIC PANEL
BUN: 16 (ref 4–21)
Creatinine: 0.9 (ref 0.5–1.1)
Glucose: 100
Potassium: 3.8 (ref 3.4–5.3)
SODIUM: 144 (ref 137–147)

## 2017-05-29 LAB — IRON,TIBC AND FERRITIN PANEL
IRON: 81
TIBC: 347
UIBC: 266

## 2017-05-29 LAB — TSH: TSH: 1.28 (ref 0.41–5.90)

## 2017-05-29 LAB — HEPATIC FUNCTION PANEL
ALK PHOS: 92 (ref 25–125)
ALT: 17 (ref 7–35)
AST: 14 (ref 13–35)
BILIRUBIN, TOTAL: 0.8

## 2017-05-29 LAB — VITAMIN D 25 HYDROXY (VIT D DEFICIENCY, FRACTURES): VIT D 25 HYDROXY: 18

## 2017-05-29 LAB — HEMOGLOBIN A1C: HEMOGLOBIN A1C: 5.9

## 2017-05-30 NOTE — Therapy (Signed)
[place] MAIN [facility] SERVICES [address] [place], [place], [address] Phone: [phone]   Fax:  [phone]  Occupational Therapy Treatment  Patient Details  Name: Madison Palmer MRN: [id number] Date of Birth: 06-28-1965 Referring Provider: Debbora Dus, MD   Encounter Date: 05/29/2017  OT End of Session - 05/30/17 1137    Visit Number  3    Number of Visits  36    Date for OT Re-Evaluation  08/20/17    OT Start Time  0103    OT Stop Time  0208    OT Time Calculation (min)  65 min       Past Medical History:  Diagnosis Date  . Arrhythmia    heart  . Arthritis   . Chickenpox   . Depression   . Diverticulitis   . GERD (gastroesophageal reflux disease)   . Glaucoma   . Headache   . Hyperlipidemia   . Hypertension   . Inflammatory polyps of colon ([place])   . Thyroid disease   . UTI (urinary tract infection)     Past Surgical History:  Procedure Laterality Date  . BREAST BIOPSY  2015  . CESAREAN SECTION  2004  . INNER EAR SURGERY     ear and sinus surgery  . OOPHORECTOMY Left   . TONSILLECTOMY AND ADENOIDECTOMY      There were no vitals filed for this visit.  Subjective Assessment - 05/30/17 1130    Subjective   Pt presents for OT visit 3/36 to for Intensive Phase CDT to  BLE. Pt reports knee-length wraps essentially remained in place on LLE except if slid down somewhat. She states she was able to tolwerate it without difficulty. Pt reports that she needs to cancel  appointments due to out of state travel  .    Pertinent History  onset BLE LE 2004 s/p C section, essential HTN, GERD, Hypothyroidism, Obesity; OSA ( compliant w/ C-Pap) Lap Band sx s/p 2009 Lap banc removed 2013, OA    Limitations  limited hip AROM- unable to reach feet and distal legs to don socks, compression garments, lace up street shoews, decreased standing and walking tolerance 2/2 leg swelling and associated pain,     Special Tests  Initial  Lymphedema Life Impact Scale [facility]) score measures extent of impairment 2/2 lymphedema " in the pst week" at 49%.     Patient Stated Goals  decrease leg edema and get it under better control; explore alternative compression garments/ devices    Currently in Pain?  No/denies    Pain Onset  Other (comment) chronic                   OT Treatments/Exercises (OP) - 05/30/17 0001      Manual Therapy   Manual Therapy  Edema management;Compression Bandaging    Compression Bandaging  Single layer 0.4 cm Rosidal foam over  knee length cotton stockinett.; 8 cm, 10 cm and 12 cm short stretchh wraps applied in succession from smallest to largest in gradient compression configuration  from foot to below knee.             OT Education - 05/30/17 1136    Education provided  Yes    Education Details  Continued skilled Pt/caregiver education  And LE ADL training throughout visit for lymphedema self care/ home program, including compression wrapping, compression garment and device wear/care, lymphatic pumping ther ex, simple self-MLD, and skin care. Discussed progress towards  goals.    Person(s) Educated  Patient    Methods  Explanation;Demonstration;Verbal cues;Tactile cues;Handout    Comprehension  Verbalized understanding;Returned demonstration;Need further instruction          OT Long Term Goals - 05/22/17 0951      OT LONG TERM GOAL #1   Title  Pt modified independent w/ lymphedema precautions and prevention principals and strategies using printed reference to limit LE progression and infection risk.    Baseline  Max A    Time  2    Period  Weeks    Status  New    Target Date  -- 10th OT visit      OT LONG TERM GOAL #2   Title  Lymphedema (LE) management/ self-care: Pt able to apply knee length, multi layered, gradient compression wraps with minimal assistance using proper techniques within 2 weeks to achieve optimal limb volume reductions in preparation for fitting  compression garments/ devices bilaterally.    Baseline  Max A    Time  2    Period  Weeks    Status  New    Target Date  -- 10th OT visit      OT LONG TERM GOAL #3   Title  Lymphedema (LE) management/ self-care:  Pt to achieve at least 10%  BLE limb volume reductions  during Intensive Phase CDT to limit LE progression, to reduce pain, to improve ADLs performance.    Baseline  Max A    Time  12    Period  Weeks    Status  New    Target Date  08/20/17      OT LONG TERM GOAL #4   Title  Lymphedema (LE) management/ self-care:  Pt to tolerate daily compression wraps, compression garments and/ or HOS devices in keeping w/ prescribed wear regime within 1 week of issue date of each to progress and retain clinical and functional gains and to limit LE progression.    Baseline  Max A    Time  12    Period  Weeks    Status  New    Target Date  08/20/17      OT LONG TERM GOAL #5   Title  Lymphedema (LE) self-management/ self-care:  During Management Phase CDT Pt to sustain current limb volumes within 5% using LE self-care skills and strategies learned during Intensive Phase OT independently to control limb swelling and associated pain, to  limit LE progression, to decrease infection risk and to limit further functional decline.    Baseline  Max A    Time  6    Period  Months    Status  New    Target Date  11/24/17            Plan - 05/30/17 1140    Clinical Impression Statement  Baseline Lymphedema Life Impact Scale (LLIS) score reveals extent of imapirement associated w/ lymphedema "during the past week" measures 49%. Pt able to tolerate RLE knee length compression wraps over night despite top edge sliding down to mid leg and causing ankle discomfort. Leg is natably less densly congested  at posterior distal leg  today. Skin irritation is absent. Ankle circumference is decreased mildly by visual assessment. Cont skilled Pt edu for compression wrapping today. Pt is unable to reach feet and  legs to self-wrap. She will train son over the weekend. Pt cancelled all OT sessions next week as she is traveling out of state . She agrees with plan  to put emphasis on teaching son to apply wraps during interval. Pt understands prognosis for improvement is limited without consistent at6tendance and compression. Cont asper POC. Pt in agreement w/ plan to consider postponing Intensicve Phase CDT PRN while Pt is completing PhD disertation in [place].    Occupational performance deficits (Please refer to evaluation for details):  ADL's;IADL's;Work;Leisure;Social Participation;Other body image    Rehab Potential  Good    OT Frequency  3x / week    OT Duration  12 weeks    OT Treatment/Interventions  Self-care/ADL training;DME and/or AE instruction;Manual Therapy;Patient/family education;Manual lymph drainage;Therapeutic exercise;Compression bandaging;Therapeutic activities;Other (comment) skin care w/ low ph castor oil and/ or lotion during MLD    Recommended Other Services  fit with appropriate BLE compression garments, HOSD devices that are comfortable and Pt is able to don and doff using assistive devices PRN    Consulted and Agree with Plan of Care  Patient       Patient will benefit from skilled therapeutic intervention in order to improve the following deficits and impairments:  Decreased knowledge of use of DME, Decreased skin integrity, Increased edema, Impaired flexibility, Pain, Decreased mobility, Decreased activity tolerance, Decreased range of motion, Decreased knowledge of precautions, Difficulty walking, Obesity  Visit Diagnosis: Lymphedema, not elsewhere classified    Problem List Patient Active Problem List   Diagnosis Date Noted  . Hx of colonic polyps 05/15/2017  . Eustachian tube dysfunction, right 05/15/2017  . Lymphatic edema 05/15/2017  . Hypertension 05/14/2017  . Anemia 05/11/2017  . Arrhythmia 05/11/2017  . Diverticulosis 05/11/2017  . GERD (gastroesophageal reflux  disease) 05/11/2017  . Hypothyroidism 05/11/2017  . Lower extremity edema 05/11/2017  . Morbid obesity ([place]) 05/11/2017  . Polycystic ovary syndrome 05/11/2017  . OSA on CPAP 05/11/2017  . Neck pain 05/11/2017  . Allergic rhinitis 05/11/2017  . 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 [place], [place], [address] Phone: [phone]   Fax:  [phone]  Name: Madison Palmer MRN: [id number] Date of Birth: January 07, 1966

## 2017-05-31 ENCOUNTER — Ambulatory Visit: Payer: BLUE CROSS/BLUE SHIELD | Admitting: Occupational Therapy

## 2017-05-31 DIAGNOSIS — I890 Lymphedema, not elsewhere classified: Secondary | ICD-10-CM

## 2017-05-31 NOTE — Therapy (Signed)
[place] MAIN [facility] SERVICES [address] [place], [place], [address] Phone: [phone]   Fax:  [phone]  Occupational Therapy Treatment  Patient Details  Name: Madison Palmer MRN: [id number] Date of Birth: [birth year] Referring Provider: Debbora Dus, MD   Encounter Date: 05/31/2017  OT End of Session - 05/31/17 1658    Visit Number  4    Number of Visits  36    Date for OT Re-Evaluation  08/20/17    OT Start Time  0100    OT Stop Time  0200    OT Time Calculation (min)  60 min       Past Medical History:  Diagnosis Date  . Arrhythmia    heart  . Arthritis   . Chickenpox   . Depression   . Diverticulitis   . GERD (gastroesophageal reflux disease)   . Glaucoma   . Headache   . Hyperlipidemia   . Hypertension   . Inflammatory polyps of colon ([place])   . Thyroid disease   . UTI (urinary tract infection)     Past Surgical History:  Procedure Laterality Date  . BREAST BIOPSY  2015  . CESAREAN SECTION  2004  . INNER EAR SURGERY     ear and sinus surgery  . OOPHORECTOMY Left   . TONSILLECTOMY AND ADENOIDECTOMY      There were no vitals filed for this visit.  Subjective Assessment - 05/31/17 1655    Subjective   Pt presents for OT visit 4/36 to for Intensive Phase CDT to  BLE. Pt reports wraps stayed on until about 1 AM last night. "They started bunching and falling down and I had to take them off. They were itching bad too."    Pertinent History  onset BLE LE 2004 s/p C section, essential HTN, GERD, Hypothyroidism, Obesity; OSA ( compliant w/ C-Pap) Lap Band sx s/p 2009 Lap banc removed 2013, OA    Limitations  limited hip AROM- unable to reach feet and distal legs to don socks, compression garments, lace up street shoews, decreased standing and walking tolerance 2/2 leg swelling and associated pain,     Special Tests  Initial Lymphedema Life Impact Scale [facility]) score measures extent of impairment 2/2 lymphedema " in  the pst week" at 49%.     Patient Stated Goals  decrease leg edema and get it under better control; explore alternative compression garments/ devices    Currently in Pain?  No/denies    Pain Onset  Other (comment) chronic                   OT Treatments/Exercises (OP) - 05/31/17 0001      ADLs   ADL Education Given  Yes      Manual Therapy   Manual Therapy  Edema management;Manual Lymphatic Drainage (MLD);Compression Bandaging    Manual therapy comments  skin care to LLE with low ph castor oil throughout MLD    Manual Lymphatic Drainage (MLD)  MLE to LLE utilizing short neck sequence, functional inguinal LN and deep  abdominal pathways.             OT Education - 05/31/17 1658    Education provided  Yes    Education Details  Continued skilled Pt/caregiver education  And LE ADL training throughout visit for lymphedema self care/ home program, including compression wrapping, compression garment and device wear/care, lymphatic pumping ther ex, simple self-MLD, and skin care. Discussed progress towards goals.  Person(s) Educated  Patient    Methods  Demonstration;Explanation;Verbal cues;Tactile cues;Handout;Other (comment)    Comprehension  Verbalized understanding;Returned demonstration;Need further instruction          OT Long Term Goals - 05/22/17 0951      OT LONG TERM GOAL #1   Title  Pt modified independent w/ lymphedema precautions and prevention principals and strategies using printed reference to limit LE progression and infection risk.    Baseline  Max A    Time  2    Period  Weeks    Status  New    Target Date  -- 10th OT visit      OT LONG TERM GOAL #2   Title  Lymphedema (LE) management/ self-care: Pt able to apply knee length, multi layered, gradient compression wraps with minimal assistance using proper techniques within 2 weeks to achieve optimal limb volume reductions in preparation for fitting compression garments/ devices bilaterally.     Baseline  Max A    Time  2    Period  Weeks    Status  New    Target Date  -- 10th OT visit      OT LONG TERM GOAL #3   Title  Lymphedema (LE) management/ self-care:  Pt to achieve at least 10%  BLE limb volume reductions  during Intensive Phase CDT to limit LE progression, to reduce pain, to improve ADLs performance.    Baseline  Max A    Time  12    Period  Weeks    Status  New    Target Date  08/20/17      OT LONG TERM GOAL #4   Title  Lymphedema (LE) management/ self-care:  Pt to tolerate daily compression wraps, compression garments and/ or HOS devices in keeping w/ prescribed wear regime within 1 week of issue date of each to progress and retain clinical and functional gains and to limit LE progression.    Baseline  Max A    Time  12    Period  Weeks    Status  New    Target Date  08/20/17      OT LONG TERM GOAL #5   Title  Lymphedema (LE) self-management/ self-care:  During Management Phase CDT Pt to sustain current limb volumes within 5% using LE self-care skills and strategies learned during Intensive Phase OT independently to control limb swelling and associated pain, to  limit LE progression, to decrease infection risk and to limit further functional decline.    Baseline  Max A    Time  6    Period  Months    Status  New    Target Date  11/24/17            Plan - 05/31/17 1659    Clinical Impression Statement  LLE is slightly less congested oday, specifically behind L ankle, at medial inferior knee and at medial distal L thigh. Pt had difficulty keeping wraps in place and tolerating them over visit interval. Leg shap is extreme inverted cone, and this is difficult to wrap without bandages migrating downward with gravity. Every movement at the knee losens wraps. Pt game to try again and we made a few modifications on tial basis. Pt tolerated initial MLD session. Cont as per POC. Pt will miss appointments next week due to travel. She understands that inconsistent  attendance will limit progress.  Occupational performance deficits (Please refer to evaluation for details):  ADL's;IADL's;Work;Leisure;Social Participation;Other body image    Rehab Potential  Good    OT Frequency  3x / week    OT Duration  12 weeks    OT Treatment/Interventions  Self-care/ADL training;DME and/or AE instruction;Manual Therapy;Patient/family education;Manual lymph drainage;Therapeutic exercise;Compression bandaging;Therapeutic activities;Other (comment) skin care w/ low ph castor oil and/ or lotion during MLD    Recommended Other Services  fit with appropriate BLE compression garments, HOSD devices that are comfortable and Pt is able to don and doff using assistive devices PRN    Consulted and Agree with Plan of Care  Patient       Patient will benefit from skilled therapeutic intervention in order to improve the following deficits and impairments:  Decreased knowledge of use of DME, Decreased skin integrity, Increased edema, Impaired flexibility, Pain, Decreased mobility, Decreased activity tolerance, Decreased range of motion, Decreased knowledge of precautions, Difficulty walking, Obesity  Visit Diagnosis: Lymphedema, not elsewhere classified    Problem List Patient Active Problem List   Diagnosis Date Noted  . Hx of colonic polyps 05/15/2017  . Eustachian tube dysfunction, right 05/15/2017  . Lymphatic edema 05/15/2017  . Hypertension 05/14/2017  . Anemia 05/11/2017  . Arrhythmia 05/11/2017  . Diverticulosis 05/11/2017  . GERD (gastroesophageal reflux disease) 05/11/2017  . Hypothyroidism 05/11/2017  . Lower extremity edema 05/11/2017  . Morbid obesity ([place]) 05/11/2017  . Polycystic ovary syndrome 05/11/2017  . OSA on CPAP 05/11/2017  . Neck pain 05/11/2017  . Allergic rhinitis 05/11/2017  . 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 [place], [place], [address] Phone: [phone]   Fax:  [phone]  Name: Madison Palmer MRN: [id number] Date of Birth: 27-Apr-1966

## 2017-06-04 ENCOUNTER — Telehealth: Payer: Self-pay | Admitting: Pulmonary Disease

## 2017-06-04 NOTE — Telephone Encounter (Signed)
Spoke with pt, I advised her that her sleep study report was sent to [facility] but they did not receive it. The report went to scanning and this is holding up the process of her order. I asked her could she come by and drop off another copy so we could fax to them. She agreed and I expressed my apologies for this being an inconvenience to her. Will await sleep study and leave encounter open.    Huey Romans Fax [phone]

## 2017-06-07 ENCOUNTER — Ambulatory Visit: Payer: BLUE CROSS/BLUE SHIELD | Attending: Nurse Practitioner | Admitting: Occupational Therapy

## 2017-06-07 DIAGNOSIS — I890 Lymphedema, not elsewhere classified: Secondary | ICD-10-CM | POA: Diagnosis present

## 2017-06-07 NOTE — Telephone Encounter (Signed)
Sleep study not yet scanned into chart. Checked RA's cubby, up front, and with Cherina- pt has not yet brought copy of sleep study by.  Will await sleep study.

## 2017-06-11 ENCOUNTER — Ambulatory Visit: Payer: BLUE CROSS/BLUE SHIELD | Admitting: Occupational Therapy

## 2017-06-11 DIAGNOSIS — I890 Lymphedema, not elsewhere classified: Secondary | ICD-10-CM | POA: Diagnosis not present

## 2017-06-11 NOTE — Therapy (Signed)
[place] MAIN [facility] SERVICES [address] [place], [place], [address] Phone: [phone]   Fax:  [phone]  Occupational Therapy Treatment  Patient Details  Name: Madison Palmer MRN: [id number] Date of Birth: 09-22-1965 Referring Provider: Debbora Dus, MD   Encounter Date: 06/11/2017  OT End of Session - 06/11/17 1648    Visit Number  5    Number of Visits  36    Date for OT Re-Evaluation  08/20/17    OT Start Time  0100    OT Stop Time  0200    OT Time Calculation (min)  60 min    Activity Tolerance  Patient tolerated treatment well    Behavior During Therapy  [facility] for tasks assessed/performed       Past Medical History:  Diagnosis Date  . Arrhythmia    heart  . Arthritis   . Chickenpox   . Depression   . Diverticulitis   . GERD (gastroesophageal reflux disease)   . Glaucoma   . Headache   . Hyperlipidemia   . Hypertension   . Inflammatory polyps of colon ([place])   . Thyroid disease   . UTI (urinary tract infection)     Past Surgical History:  Procedure Laterality Date  . BREAST BIOPSY  2015  . CESAREAN SECTION  2004  . INNER EAR SURGERY     ear and sinus surgery  . OOPHORECTOMY Left   . TONSILLECTOMY AND ADENOIDECTOMY      There were no vitals filed for this visit.  Subjective Assessment - 06/11/17 1646    Subjective   Pt presents for OT visit 5/36 to for Intensive Phase CDT to  BLE. Pt reports wraps came down soon after leaving clinic last visit. Pt tells me she has not yet taught son how to apply compression wraps as planned.    Pertinent History  onset BLE LE 2004 s/p C section, essential HTN, GERD, Hypothyroidism, Obesity; OSA ( compliant w/ C-Pap) Lap Band sx s/p 2009 Lap banc removed 2013, OA    Limitations  limited hip AROM- unable to reach feet and distal legs to don socks, compression garments, lace up street shoews, decreased standing and walking tolerance 2/2 leg swelling and associated pain,     Special Tests  Initial Lymphedema Life Impact Scale [facility]) score measures extent of impairment 2/2 lymphedema " in the pst week" at 49%.     Patient Stated Goals  decrease leg edema and get it under better control; explore alternative compression garments/ devices    Currently in Pain?  No/denies    Pain Onset  Other (comment) chronic                   OT Treatments/Exercises (OP) - 06/11/17 0001      ADLs   ADL Education Given  Yes      Manual Therapy   Manual Therapy  Manual Lymphatic Drainage (MLD);Compression Bandaging;Edema management;Soft tissue mobilization    Manual therapy comments  skin care to LLE with low ph castor oil throughout MLD    Manual Lymphatic Drainage (MLD)  MLE to LLE utilizing short neck sequence, functional inguinal LN and deep  abdominal pathways.    Compression Bandaging  3 layer short stretched wraps applied in gradient configuration over Rosidal foal  to below knee only.                  OT Long Term Goals - 05/22/17 2947  OT LONG TERM GOAL #1   Title  Pt modified independent w/ lymphedema precautions and prevention principals and strategies using printed reference to limit LE progression and infection risk.    Baseline  Max A    Time  2    Period  Weeks    Status  New    Target Date  -- 10th OT visit      OT LONG TERM GOAL #2   Title  Lymphedema (LE) management/ self-care: Pt able to apply knee length, multi layered, gradient compression wraps with minimal assistance using proper techniques within 2 weeks to achieve optimal limb volume reductions in preparation for fitting compression garments/ devices bilaterally.    Baseline  Max A    Time  2    Period  Weeks    Status  New    Target Date  -- 10th OT visit      OT LONG TERM GOAL #3   Title  Lymphedema (LE) management/ self-care:  Pt to achieve at least 10%  BLE limb volume reductions  during Intensive Phase CDT to limit LE progression, to reduce pain, to improve ADLs  performance.    Baseline  Max A    Time  12    Period  Weeks    Status  New    Target Date  08/20/17      OT LONG TERM GOAL #4   Title  Lymphedema (LE) management/ self-care:  Pt to tolerate daily compression wraps, compression garments and/ or HOS devices in keeping w/ prescribed wear regime within 1 week of issue date of each to progress and retain clinical and functional gains and to limit LE progression.    Baseline  Max A    Time  12    Period  Weeks    Status  New    Target Date  08/20/17      OT LONG TERM GOAL #5   Title  Lymphedema (LE) self-management/ self-care:  During Management Phase CDT Pt to sustain current limb volumes within 5% using LE self-care skills and strategies learned during Intensive Phase OT independently to control limb swelling and associated pain, to  limit LE progression, to decrease infection risk and to limit further functional decline.    Baseline  Max A    Time  6    Period  Months    Status  New    Target Date  11/24/17            Plan - 06/11/17 1649    Clinical Impression Statement  Pt had no difficulty tolerating LLE MLD during session today. She agreed w/ plan to DC thigh length compression and continue w/ knee length compression only as thigh length wraps will not remain in place. The fall down and bunch up, resulting in worsened distal swelling. Although Pt would benefit from full leg length compression, applying wraps to such extreme inverted come shape limits t and function significantly. Cont as per POC.    Occupational performance deficits (Please refer to evaluation for details):  ADL's;IADL's;Work;Leisure;Social Participation;Other body image    Rehab Potential  Good    OT Frequency  3x / week    OT Duration  12 weeks    OT Treatment/Interventions  Self-care/ADL training;DME and/or AE instruction;Manual Therapy;Patient/family education;Manual lymph drainage;Therapeutic exercise;Compression bandaging;Therapeutic activities;Other  (comment) skin care w/ low ph castor oil and/ or lotion during MLD    Recommended Other Services  fit with appropriate BLE compression garments, HOSD devices  that are comfortable and Pt is able to don and doff using assistive devices PRN    Consulted and Agree with Plan of Care  Patient       Patient will benefit from skilled therapeutic intervention in order to improve the following deficits and impairments:  Decreased knowledge of use of DME, Decreased skin integrity, Increased edema, Impaired flexibility, Pain, Decreased mobility, Decreased activity tolerance, Decreased range of motion, Decreased knowledge of precautions, Difficulty walking, Obesity  Visit Diagnosis: Lymphedema, not elsewhere classified    Problem List Patient Active Problem List   Diagnosis Date Noted  . Hx of colonic polyps 05/15/2017  . Eustachian tube dysfunction, right 05/15/2017  . Lymphatic edema 05/15/2017  . Hypertension 05/14/2017  . Anemia 05/11/2017  . Arrhythmia 05/11/2017  . Diverticulosis 05/11/2017  . GERD (gastroesophageal reflux disease) 05/11/2017  . Hypothyroidism 05/11/2017  . Lower extremity edema 05/11/2017  . Morbid obesity ([place]) 05/11/2017  . Polycystic ovary syndrome 05/11/2017  . OSA on CPAP 05/11/2017  . Neck pain 05/11/2017  . Allergic rhinitis 05/11/2017  . 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 [place], [place], [address] Phone: [phone]   Fax:  [phone]  Name: Madison Palmer MRN: [id number] Date of Birth: Aug 31, 1965

## 2017-06-12 ENCOUNTER — Ambulatory Visit: Payer: BLUE CROSS/BLUE SHIELD | Admitting: Occupational Therapy

## 2017-06-12 ENCOUNTER — Telehealth: Payer: Self-pay | Admitting: Pulmonary Disease

## 2017-06-12 DIAGNOSIS — I890 Lymphedema, not elsewhere classified: Secondary | ICD-10-CM | POA: Diagnosis not present

## 2017-06-12 NOTE — Telephone Encounter (Signed)
Called and spoke with [place] with [facility]- she is checking with her mgt seeing if we will need face to face with the sleep study from 2006 would work to place the cpap order for the patient. She will call back  Called and spoke with patient letting her know that we are at stand still at this time waiting to hear back from [place].  At this time Larena Glassman in medical records has not rec'd sleep study to scan into account at this time.

## 2017-06-13 ENCOUNTER — Ambulatory Visit (INDEPENDENT_AMBULATORY_CARE_PROVIDER_SITE_OTHER): Payer: BLUE CROSS/BLUE SHIELD | Admitting: Nurse Practitioner

## 2017-06-13 ENCOUNTER — Encounter: Payer: Self-pay | Admitting: Nurse Practitioner

## 2017-06-13 VITALS — BP 162/94 | HR 66 | Temp 97.4°F | Ht 63.0 in | Wt 338.0 lb

## 2017-06-13 DIAGNOSIS — I10 Essential (primary) hypertension: Secondary | ICD-10-CM | POA: Diagnosis not present

## 2017-06-13 DIAGNOSIS — E213 Hyperparathyroidism, unspecified: Secondary | ICD-10-CM | POA: Insufficient documentation

## 2017-06-13 DIAGNOSIS — Z0001 Encounter for general adult medical examination with abnormal findings: Secondary | ICD-10-CM | POA: Diagnosis not present

## 2017-06-13 DIAGNOSIS — E559 Vitamin D deficiency, unspecified: Secondary | ICD-10-CM | POA: Insufficient documentation

## 2017-06-13 DIAGNOSIS — E210 Primary hyperparathyroidism: Secondary | ICD-10-CM | POA: Insufficient documentation

## 2017-06-13 MED ORDER — HYDRALAZINE HCL 50 MG PO TABS: 50.0000 mg | ORAL_TABLET | Freq: Three times a day (TID) | ORAL | 2 refills | Status: DC

## 2017-06-13 NOTE — Therapy (Signed)
[place] MAIN [facility] SERVICES [address] [place], [place], [address] Phone: [phone]   Fax:  [phone]  Occupational Therapy Treatment  Patient Details  Name: Madison Palmer MRN: [id number] Date of Birth: 07-Nov-1965 Referring Provider: Debbora Dus, MD   Encounter Date: 06/12/2017  OT End of Session - 06/12/17 0858    Visit Number  6    Number of Visits  36    Date for OT Re-Evaluation  08/20/17    OT Start Time  0135    OT Stop Time  0245    OT Time Calculation (min)  70 min    Activity Tolerance  Patient tolerated treatment well    Behavior During Therapy  [facility] for tasks assessed/performed       Past Medical History:  Diagnosis Date  . Arrhythmia    heart  . Arthritis   . Chickenpox   . Depression   . Diverticulitis   . GERD (gastroesophageal reflux disease)   . Glaucoma   . Headache   . Hyperlipidemia   . Hypertension   . Inflammatory polyps of colon ([place])   . Thyroid disease   . UTI (urinary tract infection)     Past Surgical History:  Procedure Laterality Date  . BREAST BIOPSY  2015  . CESAREAN SECTION  2004  . INNER EAR SURGERY     ear and sinus surgery  . OOPHORECTOMY Left   . TONSILLECTOMY AND ADENOIDECTOMY      There were no vitals filed for this visit.  Subjective Assessment - 06/12/17 0856    Subjective   Pt presents for OT visit 6/36 to for Intensive Phase CDT to  BLE. PPt presents with knee length wraps in place.    Pertinent History  onset BLE LE 2004 s/p C section, essential HTN, GERD, Hypothyroidism, Obesity; OSA ( compliant w/ C-Pap) Lap Band sx s/p 2009 Lap banc removed 2013, OA    Limitations  limited hip AROM- unable to reach feet and distal legs to don socks, compression garments, lace up street shoews, decreased standing and walking tolerance 2/2 leg swelling and associated pain,     Special Tests  Initial Lymphedema Life Impact Scale [facility]) score measures extent of impairment 2/2  lymphedema " in the pst week" at 49%.     Patient Stated Goals  decrease leg edema and get it under better control; explore alternative compression garments/ devices    Currently in Pain?  No/denies    Pain Onset  Other (comment) chronic                   OT Treatments/Exercises (OP) - 06/13/17 0001      ADLs   ADL Education Given  Yes      Manual Therapy   Manual Therapy  Manual Lymphatic Drainage (MLD);Compression Bandaging;Edema management;Soft tissue mobilization    Manual therapy comments  skin care to LLE with low ph castor oil throughout MLD    Manual Lymphatic Drainage (MLD)  MLE to LLE utilizing short neck sequence, functional inguinal LN and deep  abdominal pathways.    Compression Bandaging  3 layer short stretched wraps applied in gradient configuration over Rosidal foal  to below knee only.             OT Education - 06/12/17 0857    Education provided  Yes    Education Details  Continued skilled Pt/caregiver education  And LE ADL training throughout visit for lymphedema  self care/ home program, including compression wrapping, compression garment and device wear/care, lymphatic pumping ther ex, simple self-MLD, and skin care. Discussed progress towards goals.- Emphasis on teaching simple self MLD- short neck sequence and J stroke techniques.    Person(s) Educated  Patient    Methods  Explanation;Demonstration    Comprehension  Verbalized understanding;Returned demonstration          OT Long Term Goals - 05/22/17 0951      OT LONG TERM GOAL #1   Title  Pt modified independent w/ lymphedema precautions and prevention principals and strategies using printed reference to limit LE progression and infection risk.    Baseline  Max A    Time  2    Period  Weeks    Status  New    Target Date  -- 10th OT visit      OT LONG TERM GOAL #2   Title  Lymphedema (LE) management/ self-care: Pt able to apply knee length, multi layered, gradient compression wraps  with minimal assistance using proper techniques within 2 weeks to achieve optimal limb volume reductions in preparation for fitting compression garments/ devices bilaterally.    Baseline  Max A    Time  2    Period  Weeks    Status  New    Target Date  -- 10th OT visit      OT LONG TERM GOAL #3   Title  Lymphedema (LE) management/ self-care:  Pt to achieve at least 10%  BLE limb volume reductions  during Intensive Phase CDT to limit LE progression, to reduce pain, to improve ADLs performance.    Baseline  Max A    Time  12    Period  Weeks    Status  New    Target Date  08/20/17      OT LONG TERM GOAL #4   Title  Lymphedema (LE) management/ self-care:  Pt to tolerate daily compression wraps, compression garments and/ or HOS devices in keeping w/ prescribed wear regime within 1 week of issue date of each to progress and retain clinical and functional gains and to limit LE progression.    Baseline  Max A    Time  12    Period  Weeks    Status  New    Target Date  08/20/17      OT LONG TERM GOAL #5   Title  Lymphedema (LE) self-management/ self-care:  During Management Phase CDT Pt to sustain current limb volumes within 5% using LE self-care skills and strategies learned during Intensive Phase OT independently to control limb swelling and associated pain, to  limit LE progression, to decrease infection risk and to limit further functional decline.    Baseline  Max A    Time  6    Period  Months    Status  New    Target Date  11/24/17            Plan - 06/13/17 0859    Clinical Impression Statement  Limb volume reduction observed upon removal of knee length compression wraps to LLE. Pt had no difficulty tolerating them overnight. Ankle continues to be sore, and Pt is scheduled to visit ortho physician within the next week. P Provided skilled Pt edu for simple self MLD and LE self care throughout session. MLD to LLE utilizing in tact inguinal LN and deep abdominals well tolerated.  Cont as per POC. Research insurance benefits for [employer] Flexitouch pdevice in prep  for a  trial in clinic if physician approves.     Occupational performance deficits (Please refer to evaluation for details):  ADL's;IADL's;Work;Leisure;Social Participation;Other body image    Rehab Potential  Good    OT Frequency  3x / week    OT Duration  12 weeks    OT Treatment/Interventions  Self-care/ADL training;DME and/or AE instruction;Manual Therapy;Patient/family education;Manual lymph drainage;Therapeutic exercise;Compression bandaging;Therapeutic activities;Other (comment) skin care w/ low ph castor oil and/ or lotion during MLD    Recommended Other Services  fit with appropriate BLE compression garments, HOSD devices that are comfortable and Pt is able to don and doff using assistive devices PRN    Consulted and Agree with Plan of Care  Patient       Patient will benefit from skilled therapeutic intervention in order to improve the following deficits and impairments:  Decreased knowledge of use of DME, Decreased skin integrity, Increased edema, Impaired flexibility, Pain, Decreased mobility, Decreased activity tolerance, Decreased range of motion, Decreased knowledge of precautions, Difficulty walking, Obesity  Visit Diagnosis: Lymphedema, not elsewhere classified    Problem List Patient Active Problem List   Diagnosis Date Noted  . Hx of colonic polyps 05/15/2017  . Eustachian tube dysfunction, right 05/15/2017  . Lymphatic edema 05/15/2017  . Hypertension 05/14/2017  . Anemia 05/11/2017  . Arrhythmia 05/11/2017  . Diverticulosis 05/11/2017  . GERD (gastroesophageal reflux disease) 05/11/2017  . Hypothyroidism 05/11/2017  . Lower extremity edema 05/11/2017  . Morbid obesity ([place]) 05/11/2017  . Polycystic ovary syndrome 05/11/2017  . OSA on CPAP 05/11/2017  . Neck pain 05/11/2017  . Allergic rhinitis 05/11/2017  . 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

## 2017-06-13 NOTE — Assessment & Plan Note (Addendum)
Uncontrolled. Increased hydralazine to 50mg  TID. Maintain atenolol and hCTZ at current dose. Consider decreasing HCTZ dose to 25mg  and adding spironolactone if PTH, glucose and calcium stays elevated.  She reports hx of HOCM and carotid stenosis. Need records from cardiology.

## 2017-06-13 NOTE — Patient Instructions (Addendum)
Please have records from endocrinology, GYN and hematology sent to [place]. Need to sign individual medical release forms for endocrinology, hematology, and GYN.  Email a copy of recent labs results from hematology and endocrinology through [place].  Please bring your BP machine to next Office visit. Continue to check BP once a day and record. Increase hydralazine to 50mg  TID.  Start vitamin D and metformin as prescribed by endocrinology.  I will contact lymphedema clinic about ordering compression device.

## 2017-06-13 NOTE — Progress Notes (Signed)
Subjective:  Patient ID: Madison Palmer, female    DOB: December 15, 1965  Age: 52 y.o. MRN: [id number]  CC: Annual Exam (CPE--fasting--[place]- had pap 71/2018 normal--has BP reading with her)   HPI  HTN: Uncontrolled with atenolol, hydralazine and hydrochlorothiazide. Reports Home BP readings: 145-158/82-89. BP Readings from Last 3 Encounters:  06/13/17 (!) 162/94  05/15/17 (!) 160/94  05/14/17 (!) 162/98   Hypothyroidism: Seen endocrinology 05/29/2017 in [place]. Reports she was also diagnosed with PCOS. Repeat thyroid US: change in thyroid nodule per patient. Thyroid biopsy recommended per patient Labs done: HgbA1c,TSH, T4, CMP, PTH, vitamin D (reports elevated PTH and calcium, low vitamin D) reviewed through her mychart account. Metformin and vitamin D prescribed.  Anemia due to chronic blood loss: Iron infusion by hematology while in [place] per patient. Last seen 05/2017. CBC and iron panel done: stable per patient. Last infusion 09/2016.  Reports Hx of Hypertrophic cardiomyopathy and carotid stenosis: Last saw cardiology 05/2016. Echocardiogram and carotid doppler done in last 1year per patient  Lymphedema: Continues visits with lymphedema clinic. Current use of compression wrap on left LE. Compression device recommended.  Menorrhagia: Report she is up to date with PAP and mammogram. Will have reports faxed from previous GYN. LMP 03/2017: irregular menstrual cycle.  Vision: up to date  Dental: Up to date  Exercise: limited due to weight and LE edema.  Depression screen PHQ 2/9 05/15/2017  Decreased Interest 0  Down, Depressed, Hopeless 0  PHQ - 2 Score 0    Outpatient Medications Prior to Visit  Medication Sig Dispense Refill  . atenolol (TENORMIN) 25 MG tablet TK 1 T PO D  0  . esomeprazole (NEXIUM) 40 MG capsule TK ONE C PO  D  1  . fluticasone (FLONASE) 50 MCG/ACT nasal spray Place 2 sprays into both nostrils daily.    .  hydrochlorothiazide (HYDRODIURIL) 50 MG tablet Take 50 mg by mouth daily.  3  . ibuprofen (ADVIL,MOTRIN) 600 MG tablet Take 600 mg by mouth every 6 (six) hours as needed.    Marland Kitchen levothyroxine (SYNTHROID, LEVOTHROID) 75 MCG tablet TK 1 T PO QD  MONDAY-FRIDAY  0  . ranitidine (ZANTAC) 150 MG tablet Take by mouth.    . hydrALAZINE (APRESOLINE) 50 MG tablet TK 1 T PO BID  3   No facility-administered medications prior to visit.    No family history on file.  Social History   Socioeconomic History  . Marital status: Married    Spouse name: Not on file  . Number of children: Not on file  . Years of education: Not on file  . Highest education level: Not on file  Social Needs  . Financial resource strain: Not on file  . Food insecurity - worry: Not on file  . Food insecurity - inability: Not on file  . Transportation needs - medical: Not on file  . Transportation needs - non-medical: Not on file  Occupational History  . Not on file  Tobacco Use  . Smoking status: Never Smoker  . Smokeless tobacco: Never Used  Substance and Sexual Activity  . Alcohol use: Yes    Comment: social  . Drug use: No  . Sexual activity: Not on file  Other Topics Concern  . Not on file  Social History Narrative  . Not on file   ROS Review of Systems  Constitutional: Negative for fever, malaise/fatigue and weight loss.  HENT: Negative for congestion and sore throat.   Eyes:  Negative for visual changes  Respiratory: Negative for cough and shortness of breath.   Cardiovascular: Positive for leg swelling. Negative for chest pain and palpitations.  Gastrointestinal: Positive for heartburn. Negative for abdominal pain, blood in stool, constipation, diarrhea, melena and nausea.  Genitourinary: Negative for dysuria, frequency and urgency.  Musculoskeletal: Negative for falls, joint pain and myalgias.  Skin: Negative for rash.  Neurological: Negative for dizziness, sensory change and headaches.    Endo/Heme/Allergies: Does not bruise/bleed easily.  Psychiatric/Behavioral: Negative for depression, substance abuse and suicidal ideas. The patient is not nervous/anxious and does not have insomnia.     Objective:  BP (!) 162/94   Pulse 66   Temp (!) 97.4 F (36.3 C)   Ht 5\' 3"  (1.6 m)   Wt (!) 338 lb (153.3 kg)   LMP 04/02/2017 (Approximate)   SpO2 95%   BMI 59.87 kg/m   BP Readings from Last 3 Encounters:  06/13/17 (!) 162/94  05/15/17 (!) 160/94  05/14/17 (!) 162/98    Wt Readings from Last 3 Encounters:  06/13/17 (!) 338 lb (153.3 kg)  05/15/17 (!) 329 lb (149.2 kg)  05/14/17 (!) 338 lb (153.3 kg)    Physical Exam  Constitutional: She is oriented to person, place, and time. No distress.  Neck: Normal range of motion. Neck supple.  Cardiovascular: Normal rate and regular rhythm. Exam reveals gallop, S4 and distant heart sounds.  Murmur heard. Bilateral LE edema: left worse than right. Left LE with Unna boot.  Pulmonary/Chest: Effort normal and breath sounds normal.  Abdominal: Soft. Bowel sounds are normal. She exhibits no distension. There is no tenderness.  Musculoskeletal: She exhibits edema.  Neurological: She is alert and oriented to person, place, and time.  Skin: Skin is warm and dry. No erythema.  Psychiatric: She has a normal mood and affect. Her behavior is normal. Thought content normal.  Vitals reviewed.   No results found for: WBC, HGB, HCT, PLT, GLUCOSE, CHOL, TRIG, HDL, LDLDIRECT, LDLCALC, ALT, AST, NA, K, CL, CREATININE, BUN, CO2, TSH, PSA, INR, GLUF, HGBA1C, MICROALBUR   Assessment & Plan:   I spent 79mins with Ms.Fellner today. 50% of this visit was spent reviewing labs done by endocrinology and hematology. These labs were available through her mychart account. We also spent time talking about necessary diet changes to promote weight loss (keto vs weight watchers). We also talked about me assuming the responsibility of monitoring thyroid  function, glucose, HTN, and cardiomyopathy. We agreed I will monitor these labs at this time. We agreed I will refer to local specialist if needed.  Pansy was seen today for annual exam.  Diagnoses and all orders for this visit:  Encounter for preventative adult health care exam with abnormal findings  Essential hypertension -     hydrALAZINE (APRESOLINE) 50 MG tablet; Take 1 tablet (50 mg total) by mouth 3 (three) times daily.   I have changed Priscella L. Gholson's hydrALAZINE. I am also having her maintain her atenolol, esomeprazole, levothyroxine, ibuprofen, fluticasone, hydrochlorothiazide, and ranitidine.  Meds ordered this encounter  Medications  . hydrALAZINE (APRESOLINE) 50 MG tablet    Sig: Take 1 tablet (50 mg total) by mouth 3 (three) times daily.    Dispense:  90 tablet    Refill:  2    Order Specific Question:   Supervising Provider    Answer:   Lucille Passy [[id number]]    Follow-up: Return in about 3 months (around 09/10/2017) for HTN,, hyperlipidemia (fasting for CMP, lipid  panel, PTH, vitamin D, hgbA1c).  Wilfred Lacy, NP

## 2017-06-13 NOTE — Assessment & Plan Note (Signed)
Iron infusion in past per patient. Need records from previous hematology. Need colonoscopy or cologuard?

## 2017-06-14 ENCOUNTER — Ambulatory Visit: Payer: BLUE CROSS/BLUE SHIELD | Admitting: Occupational Therapy

## 2017-06-14 DIAGNOSIS — I890 Lymphedema, not elsewhere classified: Secondary | ICD-10-CM | POA: Diagnosis not present

## 2017-06-14 NOTE — Telephone Encounter (Signed)
Patient is returning phone call.  °

## 2017-06-14 NOTE — Therapy (Signed)
[place] MAIN [facility] SERVICES [address] [place], [place], [address] Phone: [phone]   Fax:  [phone]  Occupational Therapy Treatment  Patient Details  Name: Madison Palmer MRN: [id number] Date of Birth: July 15, 1965 Referring Provider: Debbora Dus, MD   Encounter Date: 06/07/2017  OT End of Session - 06/14/17 1701    Visit Number  5    Number of Visits  36    Date for OT Re-Evaluation  08/20/17    OT Start Time  0103    OT Stop Time  0210    OT Time Calculation (min)  67 min    Activity Tolerance  Patient tolerated treatment well    Behavior During Therapy  [facility] for tasks assessed/performed       Past Medical History:  Diagnosis Date  . Arrhythmia    heart  . Arthritis   . Cardiomyopathy ([place])   . Chickenpox   . Depression   . Diverticulitis   . GERD (gastroesophageal reflux disease)   . Glaucoma   . Headache   . History of PCOS   . Hyperlipidemia   . Hypertension   . Inflammatory polyps of colon ([place])   . Recurrent UTI   . Thyroid disease   . UTI (urinary tract infection)     Past Surgical History:  Procedure Laterality Date  . BREAST BIOPSY  2015  . CESAREAN SECTION  2004  . INNER EAR SURGERY     ear and sinus surgery  . LAPAROSCOPIC REPAIR AND REMOVAL OF GASTRIC BAND    . OOPHORECTOMY Left   . TONSILLECTOMY AND ADENOIDECTOMY      There were no vitals filed for this visit.                OT Treatments/Exercises (OP) - 06/14/17 0001      ADLs   ADL Education Given  Yes      Manual Therapy   Manual Therapy  Manual Lymphatic Drainage (MLD);Compression Bandaging;Edema management;Soft tissue mobilization    Manual therapy comments  skin care to LLE with low ph castor oil throughout MLD    Manual Lymphatic Drainage (MLD)  MLE to LLE utilizing short neck sequence, functional inguinal LN and deep  abdominal pathways.    Compression Bandaging  3 layer short stretched wraps applied in  gradient configuration over Rosidal foal  to below knee only.                  OT Long Term Goals - 05/22/17 0951      OT LONG TERM GOAL #1   Title  Pt modified independent w/ lymphedema precautions and prevention principals and strategies using printed reference to limit LE progression and infection risk.    Baseline  Max A    Time  2    Period  Weeks    Status  New    Target Date  -- 10th OT visit      OT LONG TERM GOAL #2   Title  Lymphedema (LE) management/ self-care: Pt able to apply knee length, multi layered, gradient compression wraps with minimal assistance using proper techniques within 2 weeks to achieve optimal limb volume reductions in preparation for fitting compression garments/ devices bilaterally.    Baseline  Max A    Time  2    Period  Weeks    Status  New    Target Date  -- 10th OT visit      OT  LONG TERM GOAL #3   Title  Lymphedema (LE) management/ self-care:  Pt to achieve at least 10%  BLE limb volume reductions  during Intensive Phase CDT to limit LE progression, to reduce pain, to improve ADLs performance.    Baseline  Max A    Time  12    Period  Weeks    Status  New    Target Date  08/20/17      OT LONG TERM GOAL #4   Title  Lymphedema (LE) management/ self-care:  Pt to tolerate daily compression wraps, compression garments and/ or HOS devices in keeping w/ prescribed wear regime within 1 week of issue date of each to progress and retain clinical and functional gains and to limit LE progression.    Baseline  Max A    Time  12    Period  Weeks    Status  New    Target Date  08/20/17      OT LONG TERM GOAL #5   Title  Lymphedema (LE) self-management/ self-care:  During Management Phase CDT Pt to sustain current limb volumes within 5% using LE self-care skills and strategies learned during Intensive Phase OT independently to control limb swelling and associated pain, to  limit LE progression, to decrease infection risk and to limit further  functional decline.    Baseline  Max A    Time  6    Period  Months    Status  New    Target Date  11/24/17              Patient will benefit from skilled therapeutic intervention in order to improve the following deficits and impairments:  Decreased knowledge of use of DME, Decreased skin integrity, Increased edema, Impaired flexibility, Pain, Decreased mobility, Decreased activity tolerance, Decreased range of motion, Decreased knowledge of precautions, Difficulty walking, Obesity  Visit Diagnosis: Lymphedema, not elsewhere classified    Problem List Patient Active Problem List   Diagnosis Date Noted  . Elevated parathyroid hormone 06/13/2017  . Vitamin D deficiency 06/13/2017  . Hx of colonic polyps 05/15/2017  . Eustachian tube dysfunction, right 05/15/2017  . Lymphatic edema 05/15/2017  . Hypertension 05/14/2017  . Anemia 05/11/2017  . Arrhythmia 05/11/2017  . Diverticulosis 05/11/2017  . GERD (gastroesophageal reflux disease) 05/11/2017  . Hypothyroidism 05/11/2017  . Lower extremity edema 05/11/2017  . Morbid obesity ([place]) 05/11/2017  . Polycystic ovary syndrome 05/11/2017  . OSA on CPAP 05/11/2017  . Neck pain 05/11/2017  . Allergic rhinitis 05/11/2017  . 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 [place], [place], [address] Phone: [phone]   Fax:  [phone]  Name: BALERIA WYMAN MRN: [id number] Date of Birth: 10/26/1965

## 2017-06-14 NOTE — Telephone Encounter (Signed)
Order faxed to [employer] and ov that is all that was needed Madison Palmer

## 2017-06-14 NOTE — Telephone Encounter (Signed)
ATC pt, no answer. Left message for pt to call back.  

## 2017-06-14 NOTE — Telephone Encounter (Signed)
Kenney Houseman called back and stated we just needed to send the order, sleep study, and  office notes to [phone]. PCC's do I need to place another order?

## 2017-06-14 NOTE — Telephone Encounter (Signed)
Tonya calling from [place]. Per Kenney Houseman if pt still has [employer] they can use the old sleep study results. If she does not have [employer] then they will need to look into doing a new one for her Cpap. Cb is [phone]

## 2017-06-14 NOTE — Telephone Encounter (Signed)
Spoke with Magda Paganini and advised her that I spoke with pt and she stated she has the same insurance, nothing has changed. She advised she would have Tonya call me back.

## 2017-06-15 NOTE — Telephone Encounter (Signed)
Called and spoke with patient, she states that she is unable to bring copy up to office at this time but she will bring it back as soon as she can.

## 2017-06-18 ENCOUNTER — Ambulatory Visit: Payer: BLUE CROSS/BLUE SHIELD | Admitting: Occupational Therapy

## 2017-06-18 DIAGNOSIS — I890 Lymphedema, not elsewhere classified: Secondary | ICD-10-CM

## 2017-06-18 NOTE — Therapy (Signed)
[place] MAIN [facility] SERVICES [address] [place], [place], [address] Phone: [phone]   Fax:  [phone]  Occupational Therapy Treatment  Patient Details  Name: Madison Palmer MRN: [id number] Date of Birth: [birth year] Referring Provider: Debbora Dus, MD   Encounter Date: 06/18/2017  OT End of Session - 06/18/17 1644    Visit Number  8    Number of Visits  36    Date for OT Re-Evaluation  08/20/17    OT Start Time  0104    OT Stop Time  0213    OT Time Calculation (min)  69 min    Activity Tolerance  Patient tolerated treatment well    Behavior During Therapy  [facility] for tasks assessed/performed       Past Medical History:  Diagnosis Date  . Arrhythmia    heart  . Arthritis   . Cardiomyopathy ([place])   . Chickenpox   . Depression   . Diverticulitis   . GERD (gastroesophageal reflux disease)   . Glaucoma   . Headache   . History of PCOS   . Hyperlipidemia   . Hypertension   . Inflammatory polyps of colon ([place])   . Recurrent UTI   . Thyroid disease   . UTI (urinary tract infection)     Past Surgical History:  Procedure Laterality Date  . BREAST BIOPSY  2015  . CESAREAN SECTION  2004  . INNER EAR SURGERY     ear and sinus surgery  . LAPAROSCOPIC REPAIR AND REMOVAL OF GASTRIC BAND    . OOPHORECTOMY Left   . TONSILLECTOMY AND ADENOIDECTOMY      There were no vitals filed for this visit.  Subjective Assessment - 06/18/17 1639    Subjective   Pt presents for OT visit 7/36 to for Intensive Phase CDT to  BLE. PPt presents without knee length wraps in place. Pt reports wraps came off of foot in the evening after session last Thursday so she removed them and has remained unwrapped dince Friday. Pt states she feels her mobility is slightly improved and she is working to increase walking more functionaly durting the day.    Pertinent History  onset BLE LE 2004 s/p C section, essential HTN, GERD, Hypothyroidism, Obesity;  OSA ( compliant w/ C-Pap) Lap Band sx s/p 2009 Lap banc removed 2013, OA    Limitations  limited hip AROM- unable to reach feet and distal legs to don socks, compression garments, lace up street shoews, decreased standing and walking tolerance 2/2 leg swelling and associated pain,     Special Tests  Initial Lymphedema Life Impact Scale [facility]) score measures extent of impairment 2/2 lymphedema " in the pst week" at 49%.     Patient Stated Goals  decrease leg edema and get it under better control; explore alternative compression garments/ devices    Currently in Pain?  No/denies    Pain Onset  Other (comment) chronic                   OT Treatments/Exercises (OP) - 06/18/17 0001      ADLs   ADL Education Given  Yes      Manual Therapy   Manual Therapy  Manual Lymphatic Drainage (MLD);Compression Bandaging;Edema management;Soft tissue mobilization    Manual therapy comments  skin care to LLE with low ph castor oil throughout MLD    Manual Lymphatic Drainage (MLD)  MLE to LLE utilizing short neck sequence, functional inguinal  LN and deep  abdominal pathways.    Compression Bandaging  3 layer short stretched wraps applied in gradient configuration over Rosidal foal  to below knee only.             OT Education - 06/18/17 1642    Education provided  Yes    Education Details  Pilar Plate discussion w/ patient [place] need for compression   consistently during Intensive Phase CDT in order to achieve goals and to achieve optimal comntrol of LE progression. Son still not wrapping as planned.    Person(s) Educated  Patient    Methods  Explanation;Demonstration    Comprehension  Verbalized understanding;Returned demonstration          OT Long Term Goals - 05/22/17 0951      OT LONG TERM GOAL #1   Title  Pt modified independent w/ lymphedema precautions and prevention principals and strategies using printed reference to limit LE progression and infection risk.    Baseline   Max A    Time  2    Period  Weeks    Status  New    Target Date  -- 10th OT visit      OT LONG TERM GOAL #2   Title  Lymphedema (LE) management/ self-care: Pt able to apply knee length, multi layered, gradient compression wraps with minimal assistance using proper techniques within 2 weeks to achieve optimal limb volume reductions in preparation for fitting compression garments/ devices bilaterally.    Baseline  Max A    Time  2    Period  Weeks    Status  New    Target Date  -- 10th OT visit      OT LONG TERM GOAL #3   Title  Lymphedema (LE) management/ self-care:  Pt to achieve at least 10%  BLE limb volume reductions  during Intensive Phase CDT to limit LE progression, to reduce pain, to improve ADLs performance.    Baseline  Max A    Time  12    Period  Weeks    Status  New    Target Date  08/20/17      OT LONG TERM GOAL #4   Title  Lymphedema (LE) management/ self-care:  Pt to tolerate daily compression wraps, compression garments and/ or HOS devices in keeping w/ prescribed wear regime within 1 week of issue date of each to progress and retain clinical and functional gains and to limit LE progression.    Baseline  Max A    Time  12    Period  Weeks    Status  New    Target Date  08/20/17      OT LONG TERM GOAL #5   Title  Lymphedema (LE) self-management/ self-care:  During Management Phase CDT Pt to sustain current limb volumes within 5% using LE self-care skills and strategies learned during Intensive Phase OT independently to control limb swelling and associated pain, to  limit LE progression, to decrease infection risk and to limit further functional decline.    Baseline  Max A    Time  6    Period  Months    Status  New    Target Date  11/24/17            Plan - 06/18/17 1644    Clinical Impression Statement   Pt tolerated MLD, skin care and knee length compression wraps below L knee. Pt reporting decreased LLE pain and improved functional mobility and  ambulation since startnig CDT. She did not quantify numerically.   Discussed progress towards goals throughout session. Pt is unable to reach below knees  o complete simple self MLD and to apply compression  without Max assistance. In terms of limb volume reduction it appears Pt has achieved minimal improvement. We'll complete comparative LLE volumetrics end of week to check progress towards goals.    Occupational performance deficits (Please refer to evaluation for details):  ADL's;IADL's;Work;Leisure;Social Participation;Other body image    Rehab Potential  Good    OT Frequency  3x / week    OT Duration  12 weeks    OT Treatment/Interventions  Self-care/ADL training;DME and/or AE instruction;Manual Therapy;Patient/family education;Manual lymph drainage;Therapeutic exercise;Compression bandaging;Therapeutic activities;Other (comment) skin care w/ low ph castor oil and/ or lotion during MLD    Recommended Other Services  fit with appropriate BLE compression garments, HOSD devices that are comfortable and Pt is able to don and doff using assistive devices PRN    Consulted and Agree with Plan of Care  Patient       Patient will benefit from skilled therapeutic intervention in order to improve the following deficits and impairments:  Decreased knowledge of use of DME, Decreased skin integrity, Increased edema, Impaired flexibility, Pain, Decreased mobility, Decreased activity tolerance, Decreased range of motion, Decreased knowledge of precautions, Difficulty walking, Obesity  Visit Diagnosis: Lymphedema, not elsewhere classified    Problem List Patient Active Problem List   Diagnosis Date Noted  . Elevated parathyroid hormone 06/13/2017  . Vitamin D deficiency 06/13/2017  . Hx of colonic polyps 05/15/2017  . Eustachian tube dysfunction, right 05/15/2017  . Lymphatic edema 05/15/2017  . Hypertension 05/14/2017  . Anemia 05/11/2017  . Arrhythmia 05/11/2017  . Diverticulosis 05/11/2017  .  GERD (gastroesophageal reflux disease) 05/11/2017  . Hypothyroidism 05/11/2017  . Lower extremity edema 05/11/2017  . Morbid obesity ([place]) 05/11/2017  . Polycystic ovary syndrome 05/11/2017  . OSA on CPAP 05/11/2017  . Neck pain 05/11/2017  . Allergic rhinitis 05/11/2017  . 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

## 2017-06-19 ENCOUNTER — Ambulatory Visit: Payer: BLUE CROSS/BLUE SHIELD | Admitting: Occupational Therapy

## 2017-06-19 DIAGNOSIS — I890 Lymphedema, not elsewhere classified: Secondary | ICD-10-CM

## 2017-06-20 ENCOUNTER — Other Ambulatory Visit: Payer: Self-pay | Admitting: Orthopedic Surgery

## 2017-06-20 DIAGNOSIS — M25572 Pain in left ankle and joints of left foot: Secondary | ICD-10-CM

## 2017-06-20 NOTE — Therapy (Signed)
[place] MAIN [facility] SERVICES [address] [place], [place], [address] Phone: [phone]   Fax:  [phone]  Occupational Therapy Treatment  Patient Details  Name: Madison Palmer MRN: [id number] Date of Birth: [birth year] Referring Provider: Debbora Dus, MD   Encounter Date: 06/19/2017  OT End of Session - 06/19/17 1127    Visit Number  9    Number of Visits  36    Date for OT Re-Evaluation  08/20/17    OT Start Time  0106    OT Stop Time  0200    OT Time Calculation (min)  54 min    Equipment Utilized During Treatment  friction mat; medi donning butlet, friction gloves, juzo tyvek sock    Activity Tolerance  Patient tolerated treatment well;No increased pain BLE AROM at all joints limited by body habitus. Pt unable to reach feet without excessive physical effort    Behavior During Therapy  [facility] for tasks assessed/performed       Past Medical History:  Diagnosis Date  . Arrhythmia    heart  . Arthritis   . Cardiomyopathy ([place])   . Chickenpox   . Depression   . Diverticulitis   . GERD (gastroesophageal reflux disease)   . Glaucoma   . Headache   . History of PCOS   . Hyperlipidemia   . Hypertension   . Inflammatory polyps of colon ([place])   . Recurrent UTI   . Thyroid disease   . UTI (urinary tract infection)     Past Surgical History:  Procedure Laterality Date  . BREAST BIOPSY  2015  . CESAREAN SECTION  2004  . INNER EAR SURGERY     ear and sinus surgery  . LAPAROSCOPIC REPAIR AND REMOVAL OF GASTRIC BAND    . OOPHORECTOMY Left   . TONSILLECTOMY AND ADENOIDECTOMY      There were no vitals filed for this visit.  Subjective Assessment - 06/19/17 1122    Subjective   Pt presents for OT visit 8/36 to for Intensive Phase CDT to  BLE. PPt presents without knee length wraps in place. Pt brings compression garments ordered by previous therapist in today for assessment. Pt reports she has had these for 2-3 years and has  never        tried to don them or to wear them.    Pertinent History  onset BLE LE 2004 s/p C section, essential HTN, GERD, Hypothyroidism, Obesity; OSA ( compliant w/ C-Pap) Lap Band sx s/p 2009 Lap banc removed 2013, OA    Limitations  limited hip AROM- unable to reach feet and distal legs to don socks, compression garments, lace up street shoews, decreased standing and walking tolerance 2/2 leg swelling and associated pain,     Special Tests  Initial Lymphedema Life Impact Scale [facility]) score measures extent of impairment 2/2 lymphedema " in the pst week" at 49%.     Patient Stated Goals  decrease leg edema and get it under better control; explore alternative compression garments/ devices    Currently in Pain?  Yes chrnic leg pain. unchanged from initial eval    Pain Onset  Other (comment) chronic                   OT Treatments/Exercises (OP) - 06/20/17 1124      ADLs   ADL Education Given  Yes      Manual Therapy   Manual Therapy  Manual Lymphatic Drainage (MLD);Compression  Bandaging;Edema management;Soft tissue mobilization    Edema Management  fitting and assessment of existing compression garments- brand new, never worn             OT Education - 06/19/17 1126    Education provided  Yes    Education Details  Emphasis of skilled Pt edu today on using assistive devices , alternative positioning, and soft tech techniques to don and doff  compression garments.    Person(s) Educated  Patient    Methods  Explanation;Demonstration;Tactile cues;Verbal cues    Comprehension  Verbalized understanding;Returned demonstration;Verbal cues required;Tactile cues required;Need further instruction          OT Long Term Goals - 05/22/17 0951      OT LONG TERM GOAL #1   Title  Pt modified independent w/ lymphedema precautions and prevention principals and strategies using printed reference to limit LE progression and infection risk.    Baseline  Max A    Time  2    Period   Weeks    Status  New    Target Date  -- 10th OT visit      OT LONG TERM GOAL #2   Title  Lymphedema (LE) management/ self-care: Pt able to apply knee length, multi layered, gradient compression wraps with minimal assistance using proper techniques within 2 weeks to achieve optimal limb volume reductions in preparation for fitting compression garments/ devices bilaterally.    Baseline  Max A    Time  2    Period  Weeks    Status  New    Target Date  -- 10th OT visit      OT LONG TERM GOAL #3   Title  Lymphedema (LE) management/ self-care:  Pt to achieve at least 10%  BLE limb volume reductions  during Intensive Phase CDT to limit LE progression, to reduce pain, to improve ADLs performance.    Baseline  Max A    Time  12    Period  Weeks    Status  New    Target Date  08/20/17      OT LONG TERM GOAL #4   Title  Lymphedema (LE) management/ self-care:  Pt to tolerate daily compression wraps, compression garments and/ or HOS devices in keeping w/ prescribed wear regime within 1 week of issue date of each to progress and retain clinical and functional gains and to limit LE progression.    Baseline  Max A    Time  12    Period  Weeks    Status  New    Target Date  08/20/17      OT LONG TERM GOAL #5   Title  Lymphedema (LE) self-management/ self-care:  During Management Phase CDT Pt to sustain current limb volumes within 5% using LE self-care skills and strategies learned during Intensive Phase OT independently to control limb swelling and associated pain, to  limit LE progression, to decrease infection risk and to limit further functional decline.    Baseline  Max A    Time  6    Period  Months    Status  New    Target Date  11/24/17            Plan - 06/20/17 1012    Clinical Impression Statement  Pt had no difficulty tolerating manual CDT. Cont as per POC    Occupational performance deficits (Please refer to evaluation for details):  ADL's;IADL's;Work;Leisure;Social  Participation;Other body image    Rehab Potential  Good    OT Frequency  3x / week    OT Duration  12 weeks    OT Treatment/Interventions  Self-care/ADL training;DME and/or AE instruction;Manual Therapy;Patient/family education;Manual lymph drainage;Therapeutic exercise;Compression bandaging;Therapeutic activities;Other (comment) skin care w/ low ph castor oil and/ or lotion during MLD    Recommended Other Services  fit with appropriate BLE compression garments, HOSD devices that are comfortable and Pt is able to don and doff using assistive devices PRN    Consulted and Agree with Plan of Care  Patient       Patient will benefit from skilled therapeutic intervention in order to improve the following deficits and impairments:  Decreased knowledge of use of DME, Decreased skin integrity, Increased edema, Impaired flexibility, Pain, Decreased mobility, Decreased activity tolerance, Decreased range of motion, Decreased knowledge of precautions, Difficulty walking, Obesity  Visit Diagnosis: Lymphedema, not elsewhere classified    Problem List Patient Active Problem List   Diagnosis Date Noted  . Elevated parathyroid hormone 06/13/2017  . Vitamin D deficiency 06/13/2017  . Hx of colonic polyps 05/15/2017  . Eustachian tube dysfunction, right 05/15/2017  . Lymphatic edema 05/15/2017  . Hypertension 05/14/2017  . Anemia 05/11/2017  . Arrhythmia 05/11/2017  . Diverticulosis 05/11/2017  . GERD (gastroesophageal reflux disease) 05/11/2017  . Hypothyroidism 05/11/2017  . Lower extremity edema 05/11/2017  . Morbid obesity ([place]) 05/11/2017  . Polycystic ovary syndrome 05/11/2017  . OSA on CPAP 05/11/2017  . Neck pain 05/11/2017  . Allergic rhinitis 05/11/2017  . 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

## 2017-06-20 NOTE — Therapy (Addendum)
[place] MAIN [facility] SERVICES [address] [place], [place], [address] Phone: [phone]   Fax:  [phone]  Occupational Therapy Treatment  Patient Details  Name: Madison Palmer MRN: [id number] Date of Birth: [birth year] Referring Provider: Debbora Dus, MD   Encounter Date: 06/14/2017    Past Medical History:  Diagnosis Date  . Arrhythmia    heart  . Arthritis   . Cardiomyopathy ([place])   . Chickenpox   . Depression   . Diverticulitis   . GERD (gastroesophageal reflux disease)   . Glaucoma   . Headache   . History of PCOS   . Hyperlipidemia   . Hypertension   . Inflammatory polyps of colon ([place])   . Recurrent UTI   . Thyroid disease   . UTI (urinary tract infection)     Past Surgical History:  Procedure Laterality Date  . BREAST BIOPSY  2015  . CESAREAN SECTION  2004  . INNER EAR SURGERY     ear and sinus surgery  . LAPAROSCOPIC REPAIR AND REMOVAL OF GASTRIC BAND    . OOPHORECTOMY Left   . TONSILLECTOMY AND ADENOIDECTOMY      There were no vitals filed for this visit.                OT Treatments/Exercises (OP) - 06/20/17 0001      ADLs   ADL Education Given  Yes      Manual Therapy   Manual Therapy  Manual Lymphatic Drainage (MLD);Compression Bandaging;Edema management;Soft tissue mobilization    Manual therapy comments  skin care to LLE with low ph castor oil throughout MLD    Manual Lymphatic Drainage (MLD)  MLE to LLE utilizing short neck sequence, functional inguinal LN and deep  abdominal pathways.    Compression Bandaging  3 layer short stretched wraps applied in gradient configuration over Rosidal foal  to below knee only.                  OT Long Term Goals - 05/22/17 0951      OT LONG TERM GOAL #1   Title  Pt modified independent w/ lymphedema precautions and prevention principals and strategies using printed reference to limit LE progression and infection risk.    Baseline  Max A    Time  2    Period  Weeks    Status  New    Target Date  -- 10th OT visit      OT LONG TERM GOAL #2   Title  Lymphedema (LE) management/ self-care: Pt able to apply knee length, multi layered, gradient compression wraps with minimal assistance using proper techniques within 2 weeks to achieve optimal limb volume reductions in preparation for fitting compression garments/ devices bilaterally.    Baseline  Max A    Time  2    Period  Weeks    Status  New    Target Date  -- 10th OT visit      OT LONG TERM GOAL #3   Title  Lymphedema (LE) management/ self-care:  Pt to achieve at least 10%  BLE limb volume reductions  during Intensive Phase CDT to limit LE progression, to reduce pain, to improve ADLs performance.    Baseline  Max A    Time  12    Period  Weeks    Status  New    Target Date  08/20/17      OT LONG TERM GOAL #4  Title  Lymphedema (LE) management/ self-care:  Pt to tolerate daily compression wraps, compression garments and/ or HOS devices in keeping w/ prescribed wear regime within 1 week of issue date of each to progress and retain clinical and functional gains and to limit LE progression.    Baseline  Max A    Time  12    Period  Weeks    Status  New    Target Date  08/20/17      OT LONG TERM GOAL #5   Title  Lymphedema (LE) self-management/ self-care:  During Management Phase CDT Pt to sustain current limb volumes within 5% using LE self-care skills and strategies learned during Intensive Phase OT independently to control limb swelling and associated pain, to  limit LE progression, to decrease infection risk and to limit further functional decline.    Baseline  Max A    Time  6    Period  Months    Status  New    Target Date  11/24/17            Plan - 06/20/17 1012    Clinical Impression Statement  Pt had no difficulty tolerating manual CDT. Cont as per POC    Occupational performance deficits (Please refer to evaluation for details):   ADL's;IADL's;Work;Leisure;Social Participation;Other body image    Rehab Potential  Good    OT Frequency  3x / week    OT Duration  12 weeks    OT Treatment/Interventions  Self-care/ADL training;DME and/or AE instruction;Manual Therapy;Patient/family education;Manual lymph drainage;Therapeutic exercise;Compression bandaging;Therapeutic activities;Other (comment) skin care w/ low ph castor oil and/ or lotion during MLD    Recommended Other Services  fit with appropriate BLE compression garments, HOSD devices that are comfortable and Pt is able to don and doff using assistive devices PRN    Consulted and Agree with Plan of Care  Patient       Patient will benefit from skilled therapeutic intervention in order to improve the following deficits and impairments:  Decreased knowledge of use of DME, Decreased skin integrity, Increased edema, Impaired flexibility, Pain, Decreased mobility, Decreased activity tolerance, Decreased range of motion, Decreased knowledge of precautions, Difficulty walking, Obesity  Visit Diagnosis: Lymphedema, not elsewhere classified    Problem List Patient Active Problem List   Diagnosis Date Noted  . Elevated parathyroid hormone 06/13/2017  . Vitamin D deficiency 06/13/2017  . Hx of colonic polyps 05/15/2017  . Eustachian tube dysfunction, right 05/15/2017  . Lymphatic edema 05/15/2017  . Hypertension 05/14/2017  . Anemia 05/11/2017  . Arrhythmia 05/11/2017  . Diverticulosis 05/11/2017  . GERD (gastroesophageal reflux disease) 05/11/2017  . Hypothyroidism 05/11/2017  . Lower extremity edema 05/11/2017  . Morbid obesity ([place]) 05/11/2017  . Polycystic ovary syndrome 05/11/2017  . OSA on CPAP 05/11/2017  . Neck pain 05/11/2017  . Allergic rhinitis 05/11/2017  . 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 [place], [place], [address] Phone: [phone]   Fax:  [phone]  Name: Madison Palmer MRN: [id number] Date of Birth: [birth year]

## 2017-06-21 ENCOUNTER — Ambulatory Visit: Payer: BLUE CROSS/BLUE SHIELD | Admitting: Occupational Therapy

## 2017-06-21 DIAGNOSIS — I890 Lymphedema, not elsewhere classified: Secondary | ICD-10-CM

## 2017-06-21 NOTE — Therapy (Signed)
[place] MAIN [facility] SERVICES [address] [place], [place], [address] Phone: [phone]   Fax:  [phone]  Occupational Therapy Treatment  Patient Details  Name: Madison Palmer MRN: [id number] Date of Birth: Mar 01, 1966 Referring Provider: Debbora Dus, MD   Encounter Date: 06/21/2017  OT End of Session - 06/21/17 1652    Visit Number  11    Number of Visits  36    Date for OT Re-Evaluation  08/20/17    OT Start Time  0104    OT Stop Time  0204    OT Time Calculation (min)  60 min    Equipment Utilized During Treatment  friction mat; medi donning butlet, friction gloves, juzo tyvek sock    Activity Tolerance  Patient tolerated treatment well;No increased pain BLE AROM at all joints limited by body habitus. Pt unable to reach feet without excessive physical effort    Behavior During Therapy  [facility] for tasks assessed/performed       Past Medical History:  Diagnosis Date  . Arrhythmia    heart  . Arthritis   . Cardiomyopathy ([place])   . Chickenpox   . Depression   . Diverticulitis   . GERD (gastroesophageal reflux disease)   . Glaucoma   . Headache   . History of PCOS   . Hyperlipidemia   . Hypertension   . Inflammatory polyps of colon ([place])   . Recurrent UTI   . Thyroid disease   . UTI (urinary tract infection)     Past Surgical History:  Procedure Laterality Date  . BREAST BIOPSY  2015  . CESAREAN SECTION  2004  . INNER EAR SURGERY     ear and sinus surgery  . LAPAROSCOPIC REPAIR AND REMOVAL OF GASTRIC BAND    . OOPHORECTOMY Left   . TONSILLECTOMY AND ADENOIDECTOMY      There were no vitals filed for this visit.  Subjective Assessment - 06/21/17 1647    Subjective   Pt presents for OT visit 9/36 to for Intensive Phase CDT to  BLE. Pt presents with knee length compression   garments in place. Pt reports she was able to don and doff garments at home independently by positioning on a lower chair at home. Pt reports  orthopedcic consult yesterday  resulted in plan for L ankle MRI.    Pertinent History  onset BLE LE 2004 s/p C section, essential HTN, GERD, Hypothyroidism, Obesity; OSA ( compliant w/ C-Pap) Lap Band sx s/p 2009 Lap banc removed 2013, OA    Limitations  limited hip AROM- unable to reach feet and distal legs to don socks, compression garments, lace up street shoews, decreased standing and walking tolerance 2/2 leg swelling and associated pain,     Special Tests  Initial Lymphedema Life Impact Scale [facility]) score measures extent of impairment 2/2 lymphedema " in the pst week" at 49%.     Patient Stated Goals  decrease leg edema and get it under better control; explore alternative compression garments/ devices    Currently in Pain?  No/denies    Pain Onset  Other (comment) chronic                   OT Treatments/Exercises (OP) - 06/21/17 0001      ADLs   ADL Education Given  Yes      Manual Therapy   Manual Therapy  Manual Lymphatic Drainage (MLD);Edema management    Manual Lymphatic Drainage (MLD)  MLE to LLE utilizing short neck sequence, functional inguinal LN and deep  abdominal pathways.    Compression Bandaging  Pt donned compression stockings after treatment independently w extra time             OT Education - 06/21/17 1650    Education provided  Yes    Education Details  Cont skilled LE self care education. Discussed multi piece compression garment options, including flat knit capri panty over existing Elvarex knee highs to achieve improved containment. Discussed upcoming pump trial.     Person(s) Educated  Patient    Methods  Explanation;Demonstration    Comprehension  Verbalized understanding;Returned demonstration          OT Long Term Goals - 05/22/17 0951      OT LONG TERM GOAL #1   Title  Pt modified independent w/ lymphedema precautions and prevention principals and strategies using printed reference to limit LE progression and infection risk.     Baseline  Max A    Time  2    Period  Weeks    Status  New    Target Date  -- 10th OT visit      OT LONG TERM GOAL #2   Title  Lymphedema (LE) management/ self-care: Pt able to apply knee length, multi layered, gradient compression wraps with minimal assistance using proper techniques within 2 weeks to achieve optimal limb volume reductions in preparation for fitting compression garments/ devices bilaterally.    Baseline  Max A    Time  2    Period  Weeks    Status  New    Target Date  -- 10th OT visit      OT LONG TERM GOAL #3   Title  Lymphedema (LE) management/ self-care:  Pt to achieve at least 10%  BLE limb volume reductions  during Intensive Phase CDT to limit LE progression, to reduce pain, to improve ADLs performance.    Baseline  Max A    Time  12    Period  Weeks    Status  New    Target Date  08/20/17      OT LONG TERM GOAL #4   Title  Lymphedema (LE) management/ self-care:  Pt to tolerate daily compression wraps, compression garments and/ or HOS devices in keeping w/ prescribed wear regime within 1 week of issue date of each to progress and retain clinical and functional gains and to limit LE progression.    Baseline  Max A    Time  12    Period  Weeks    Status  New    Target Date  08/20/17      OT LONG TERM GOAL #5   Title  Lymphedema (LE) self-management/ self-care:  During Management Phase CDT Pt to sustain current limb volumes within 5% using LE self-care skills and strategies learned during Intensive Phase OT independently to control limb swelling and associated pain, to  limit LE progression, to decrease infection risk and to limit further functional decline.    Baseline  Max A    Time  6    Period  Months    Status  New    Target Date  11/24/17            Plan - 06/21/17 1654    Clinical Impression Statement  Pt able to doff  compression garments  independently before session and don them again after using good technique last session. Pt tolerated LLE  MLD  without difficulty. Discussed plan of care going forward during manual therapy, including  garment options    for multiple pieces. Pt would like to explore these options . DME manufacturer will attend session next week for Flexitouch trial.    Occupational performance deficits (Please refer to evaluation for details):  ADL's;IADL's;Work;Leisure;Social Participation;Other body image    Rehab Potential  Good    OT Frequency  3x / week    OT Duration  12 weeks    OT Treatment/Interventions  Self-care/ADL training;DME and/or AE instruction;Manual Therapy;Patient/family education;Manual lymph drainage;Therapeutic exercise;Compression bandaging;Therapeutic activities;Other (comment) skin care w/ low ph castor oil and/ or lotion during MLD    Recommended Other Services  fit with appropriate BLE compression garments, HOSD devices that are comfortable and Pt is able to don and doff using assistive devices PRN    Consulted and Agree with Plan of Care  Patient       Patient will benefit from skilled therapeutic intervention in order to improve the following deficits and impairments:  Decreased knowledge of use of DME, Decreased skin integrity, Increased edema, Impaired flexibility, Pain, Decreased mobility, Decreased activity tolerance, Decreased range of motion, Decreased knowledge of precautions, Difficulty walking, Obesity  Visit Diagnosis: Lymphedema, not elsewhere classified    Problem List Patient Active Problem List   Diagnosis Date Noted  . Elevated parathyroid hormone 06/13/2017  . Vitamin D deficiency 06/13/2017  . Hx of colonic polyps 05/15/2017  . Eustachian tube dysfunction, right 05/15/2017  . Lymphatic edema 05/15/2017  . Hypertension 05/14/2017  . Anemia 05/11/2017  . Arrhythmia 05/11/2017  . Diverticulosis 05/11/2017  . GERD (gastroesophageal reflux disease) 05/11/2017  . Hypothyroidism 05/11/2017  . Lower extremity edema 05/11/2017  . Morbid obesity ([place]) 05/11/2017  .  Polycystic ovary syndrome 05/11/2017  . OSA on CPAP 05/11/2017  . Neck pain 05/11/2017  . Allergic rhinitis 05/11/2017  . 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 [place], [place], [address] Phone: [phone]   Fax:  [phone]  Name: Madison Palmer MRN: [id number] Date of Birth: [birth year]

## 2017-06-25 ENCOUNTER — Ambulatory Visit: Payer: BLUE CROSS/BLUE SHIELD | Admitting: Occupational Therapy

## 2017-06-25 DIAGNOSIS — I890 Lymphedema, not elsewhere classified: Secondary | ICD-10-CM

## 2017-06-25 NOTE — Therapy (Signed)
[place] MAIN [facility] SERVICES [address] [place], [place], [address] Phone: [phone]   Fax:  [phone]  Occupational Therapy Treatment  Patient Details  Name: Madison Palmer MRN: [id number] Date of Birth: [birth year] Referring Provider: Debbora Dus, MD   Encounter Date: 06/25/2017  OT End of Session - 06/25/17 1626    Visit Number  12    Number of Visits  36    Date for OT Re-Evaluation  08/20/17    OT Start Time  0104    OT Stop Time  0210    OT Time Calculation (min)  66 min    Equipment Utilized During Treatment  friction mat; medi donning butlet, friction gloves, juzo tyvek sock    Activity Tolerance  Patient tolerated treatment well;No increased pain BLE AROM at all joints limited by body habitus. Pt unable to reach feet without excessive physical effort    Behavior During Therapy  [facility] for tasks assessed/performed       Past Medical History:  Diagnosis Date  . Arrhythmia    heart  . Arthritis   . Cardiomyopathy ([place])   . Chickenpox   . Depression   . Diverticulitis   . GERD (gastroesophageal reflux disease)   . Glaucoma   . Headache   . History of PCOS   . Hyperlipidemia   . Hypertension   . Inflammatory polyps of colon ([place])   . Recurrent UTI   . Thyroid disease   . UTI (urinary tract infection)     Past Surgical History:  Procedure Laterality Date  . BREAST BIOPSY  2015  . CESAREAN SECTION  2004  . INNER EAR SURGERY     ear and sinus surgery  . LAPAROSCOPIC REPAIR AND REMOVAL OF GASTRIC BAND    . OOPHORECTOMY Left   . TONSILLECTOMY AND ADENOIDECTOMY      There were no vitals filed for this visit.  Subjective Assessment - 06/25/17 1629    Subjective   Pt presents for OT visit 12/36 to for Intensive Phase CDT to  BLE. Pt presents with knee length compression   garments in place. Pt has no new  complaints or concerns.Pt states she is looking forward to sequenial LE device trial tomorrow  (Flexitouch).    Pertinent History  onset BLE LE 2004 s/p C section, essential HTN, GERD, Hypothyroidism, Obesity; OSA ( compliant w/ C-Pap) Lap Band sx s/p 2009 Lap banc removed 2013, OA    Limitations  limited hip AROM- unable to reach feet and distal legs to don socks, compression garments, lace up street shoews, decreased standing and walking tolerance 2/2 leg swelling and associated pain,     Special Tests  Initial Lymphedema Life Impact Scale [facility]) score measures extent of impairment 2/2 lymphedema " in the pst week" at 49%.     Patient Stated Goals  decrease leg edema and get it under better control; explore alternative compression garments/ devices    Currently in Pain?  No/denies    Pain Onset  Other (comment) chronic                           OT Education - 06/25/17 1630    Education provided  Yes    Education Details  Continued skilled Pt/caregiver education  And LE ADL training throughout visit for lymphedema self care/ home program, including compression wrapping, compression garment and device wear/care, lymphatic pumping ther ex, simple self-MLD, and  skin care. Discussed progress towards goals.    Person(s) Educated  Patient    Methods  Explanation;Demonstration    Comprehension  Verbalized understanding;Returned demonstration          OT Long Term Goals - 05/22/17 0951      OT LONG TERM GOAL #1   Title  Pt modified independent w/ lymphedema precautions and prevention principals and strategies using printed reference to limit LE progression and infection risk.    Baseline  Max A    Time  2    Period  Weeks    Status  New    Target Date  -- 10th OT visit      OT LONG TERM GOAL #2   Title  Lymphedema (LE) management/ self-care: Pt able to apply knee length, multi layered, gradient compression wraps with minimal assistance using proper techniques within 2 weeks to achieve optimal limb volume reductions in preparation for fitting compression garments/  devices bilaterally.    Baseline  Max A    Time  2    Period  Weeks    Status  New    Target Date  -- 10th OT visit      OT LONG TERM GOAL #3   Title  Lymphedema (LE) management/ self-care:  Pt to achieve at least 10%  BLE limb volume reductions  during Intensive Phase CDT to limit LE progression, to reduce pain, to improve ADLs performance.    Baseline  Max A    Time  12    Period  Weeks    Status  New    Target Date  08/20/17      OT LONG TERM GOAL #4   Title  Lymphedema (LE) management/ self-care:  Pt to tolerate daily compression wraps, compression garments and/ or HOS devices in keeping w/ prescribed wear regime within 1 week of issue date of each to progress and retain clinical and functional gains and to limit LE progression.    Baseline  Max A    Time  12    Period  Weeks    Status  New    Target Date  08/20/17      OT LONG TERM GOAL #5   Title  Lymphedema (LE) self-management/ self-care:  During Management Phase CDT Pt to sustain current limb volumes within 5% using LE self-care skills and strategies learned during Intensive Phase OT independently to control limb swelling and associated pain, to  limit LE progression, to decrease infection risk and to limit further functional decline.    Baseline  Max A    Time  6    Period  Months    Status  New    Target Date  11/24/17            Plan - 06/25/17 1631    Clinical Impression Statement  Provided MLD as established. Pt tolerated manaul therapy w/o difficulty. Knee length Compression wraps discontinued going forward as existing garments fitted last week appear to provide correct compression to retain clinical volume reduction gains, and Pt is able to don and doff them w/ extra time if sitting on a low  chair. Pt will participate in trial  sequential pneumatic compression device (Flexitouch) tomorrow in clinic w/ manufacturer's rep. This device offeres 32 chambers, vs 4 chambers, and more closely duplicates MLD by starting  Rx     proximally at abdominal lymph nodes and moving distally, then back again. Cont as per POC.    Occupational performance deficits (  Please refer to evaluation for details):  ADL's;IADL's;Work;Leisure;Social Participation;Other body image    Rehab Potential  Good    OT Frequency  3x / week    OT Duration  12 weeks    OT Treatment/Interventions  Self-care/ADL training;DME and/or AE instruction;Manual Therapy;Patient/family education;Manual lymph drainage;Therapeutic exercise;Compression bandaging;Therapeutic activities;Other (comment) skin care w/ low ph castor oil and/ or lotion during MLD    Recommended Other Services  fit with appropriate BLE compression garments, HOSD devices that are comfortable and Pt is able to don and doff using assistive devices PRN    Consulted and Agree with Plan of Care  Patient       Patient will benefit from skilled therapeutic intervention in order to improve the following deficits and impairments:  Decreased knowledge of use of DME, Decreased skin integrity, Increased edema, Impaired flexibility, Pain, Decreased mobility, Decreased activity tolerance, Decreased range of motion, Decreased knowledge of precautions, Difficulty walking, Obesity  Visit Diagnosis: Lymphedema, not elsewhere classified    Problem List Patient Active Problem List   Diagnosis Date Noted  . Elevated parathyroid hormone 06/13/2017  . Vitamin D deficiency 06/13/2017  . Hx of colonic polyps 05/15/2017  . Eustachian tube dysfunction, right 05/15/2017  . Lymphatic edema 05/15/2017  . Hypertension 05/14/2017  . Anemia 05/11/2017  . Arrhythmia 05/11/2017  . Diverticulosis 05/11/2017  . GERD (gastroesophageal reflux disease) 05/11/2017  . Hypothyroidism 05/11/2017  . Lower extremity edema 05/11/2017  . Morbid obesity ([place]) 05/11/2017  . Polycystic ovary syndrome 05/11/2017  . OSA on CPAP 05/11/2017  . Neck pain 05/11/2017  . Allergic rhinitis 05/11/2017  . Ossification of  posterior longitudinal ligament ([place]) 08/28/2012    Andrey Spearman, MS, OTR/L, [facility] 06/25/17 4:36 PM  [place] MAIN [facility] SERVICES [address] [place], [place], [address] Phone: [phone]   Fax:  [phone]  Name: CLEA DUBACH MRN: [id number] Date of Birth: 1965-12-27

## 2017-06-26 ENCOUNTER — Ambulatory Visit: Payer: BLUE CROSS/BLUE SHIELD | Admitting: Occupational Therapy

## 2017-06-26 DIAGNOSIS — I890 Lymphedema, not elsewhere classified: Secondary | ICD-10-CM | POA: Diagnosis not present

## 2017-06-27 ENCOUNTER — Ambulatory Visit
Admission: RE | Admit: 2017-06-27 | Discharge: 2017-06-27 | Disposition: A | Discharge status: Home / Self Care | Payer: BLUE CROSS/BLUE SHIELD | Source: Ambulatory Visit | Attending: Orthopedic Surgery | Admitting: Orthopedic Surgery

## 2017-06-27 DIAGNOSIS — M25572 Pain in left ankle and joints of left foot: Secondary | ICD-10-CM

## 2017-06-27 NOTE — Therapy (Signed)
[place] MAIN [facility] SERVICES [address] [place], [place], [address] Phone: [phone]   Fax:  [phone]  Occupational Therapy Treatment  Patient Details  Name: Madison Palmer MRN: [id number] Date of Birth: December 06, 1965 Referring Provider: Debbora Dus, MD   Encounter Date: 06/26/2017  OT End of Session - 06/26/17 1217    Visit Number  13    Number of Visits  36    Date for OT Re-Evaluation  08/20/17    OT Start Time  1100    OT Stop Time  1220    OT Time Calculation (min)  80 min    Equipment Utilized During Treatment  [employer] : Flexitouch sequential pneumatic compression device    Activity Tolerance  Patient tolerated treatment well;No increased pain BLE AROM at all joints limited by body habitus. Pt unable to reach feet without excessive physical effort    Behavior During Therapy  [facility] for tasks assessed/performed       Past Medical History:  Diagnosis Date  . Arrhythmia    heart  . Arthritis   . Cardiomyopathy ([place])   . Chickenpox   . Depression   . Diverticulitis   . GERD (gastroesophageal reflux disease)   . Glaucoma   . Headache   . History of PCOS   . Hyperlipidemia   . Hypertension   . Inflammatory polyps of colon ([place])   . Recurrent UTI   . Thyroid disease   . UTI (urinary tract infection)     Past Surgical History:  Procedure Laterality Date  . BREAST BIOPSY  2015  . CESAREAN SECTION  2004  . INNER EAR SURGERY     ear and sinus surgery  . LAPAROSCOPIC REPAIR AND REMOVAL OF GASTRIC BAND    . OOPHORECTOMY Left   . TONSILLECTOMY AND ADENOIDECTOMY      There were no vitals filed for this visit.  Subjective Assessment - 06/26/17 1210    Subjective   Pt presents for OT visit 13/36 to for Intensive Phase CDT to  BLE. Pt presents with knee length compression   garments in place. 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     Pertinent History  onset BLE LE 2004 s/p C section, essential HTN, GERD, Hypothyroidism, Obesity; OSA ( compliant w/ C-Pap) Lap Band sx s/p 2009 Lap banc removed 2013, OA    Limitations  limited hip AROM- unable to reach feet and distal legs to don socks, compression garments, lace up street shoews, decreased standing and walking tolerance 2/2 leg swelling and associated pain,     Special Tests  Initial Lymphedema Life Impact Scale [facility]) score measures extent of impairment 2/2 lymphedema " in the pst week" at 49%.     Patient Stated Goals  decrease leg edema and get it under better control; explore alternative compression garments/ devices    Currently in Pain?  No/denies    Pain Onset  Other (comment) chronic                   OT Treatments/Exercises (OP) - 06/27/17 0001      ADLs   ADL Education Given  Yes      Manual Therapy   Manual Therapy  Edema management    Edema Management  60 minute trial of Flexitouch device in long sitting w/ back support on LLE , including foot, leg, thigh and abdominals.  OT Education - 06/26/17 1213    Education provided  Yes    Education Details  Skilled TRAINING FOR  for lymphedema self-CAREincluding indications and contraindications for 32 chambered lymphedema "pump". Discussed rational for device, optional settings, and use regime.    Person(s) Educated  Patient    Methods  Explanation;Demonstration    Comprehension  Verbalized understanding;Returned demonstration          OT Long Term Goals - 05/22/17 0951      OT LONG TERM GOAL #1   Title  Pt modified independent w/ lymphedema precautions and prevention principals and strategies using printed reference to limit LE progression and infection risk.    Baseline  Max A    Time  2    Period  Weeks    Status  New    Target Date  -- 10th OT visit      OT LONG TERM GOAL #2   Title  Lymphedema (LE) management/ self-care: Pt able to apply knee length,  multi layered, gradient compression wraps with minimal assistance using proper techniques within 2 weeks to achieve optimal limb volume reductions in preparation for fitting compression garments/ devices bilaterally.    Baseline  Max A    Time  2    Period  Weeks    Status  New    Target Date  -- 10th OT visit      OT LONG TERM GOAL #3   Title  Lymphedema (LE) management/ self-care:  Pt to achieve at least 10%  BLE limb volume reductions  during Intensive Phase CDT to limit LE progression, to reduce pain, to improve ADLs performance.    Baseline  Max A    Time  12    Period  Weeks    Status  New    Target Date  08/20/17      OT LONG TERM GOAL #4   Title  Lymphedema (LE) management/ self-care:  Pt to tolerate daily compression wraps, compression garments and/ or HOS devices in keeping w/ prescribed wear regime within 1 week of issue date of each to progress and retain clinical and functional gains and to limit LE progression.    Baseline  Max A    Time  12    Period  Weeks    Status  New    Target Date  08/20/17      OT LONG TERM GOAL #5   Title  Lymphedema (LE) self-management/ self-care:  During Management Phase CDT Pt to sustain current limb volumes within 5% using LE self-care skills and strategies learned during Intensive Phase OT independently to control limb swelling and associated pain, to  limit LE progression, to decrease infection risk and to limit further functional decline.    Baseline  Max A    Time  6    Period  Months    Status  New    Target Date  11/24/17            Plan - 06/26/17 1219    Clinical Impression Statement  Patient completed trial in clinic today of Flexitouch sequential pneumatic compression device. Pt tolerated 60 min trial on LLE using 20 mmHg on from toes to groin and including abdominal section. Before and after measurements were collected at L ankle, knee and thigh.Marland Kitchen "After" measurements for the Flexitouch revealed circumferential decreases  all landmarks of 2-3 cm. Pt tolerated the Flexitouch trial without difficulty, or increased pain. [name] will benefit from daily use of the  Flexitouch system to assist her with long -0term self-management of chronic, progressive lymphedema as she is unable to reach feet and distal legs to perform simple self-MLD effectively.The Flexitouch system is the only sequential pneumatic compression device clinically proven to address patients' lymphedema because it provides abdominal and/or chest garments that facilitate proximal lymphatic decongestion at both affected regional and deep core lymphatics. The Flexitouch mimics the body's proximal to distal decongestion pattern and also imitates MLD's proximal to distal pattern. The 32 chamber Flexitouch system is more effective than the basic, 8 chamber pneumatic compression pump without proximal stimulation. The basic 8 chamber device owned by this patient for greater than 5 years operates under higher pressure than the Flexitouch, which may result in increased swelling at the trunk.    Occupational performance deficits (Please refer to evaluation for details):  ADL's;IADL's;Work;Leisure;Social Participation;Other body image    Rehab Potential  Good    OT Frequency  3x / week    OT Duration  12 weeks    OT Treatment/Interventions  Self-care/ADL training;DME and/or AE instruction;Manual Therapy;Patient/family education;Manual lymph drainage;Therapeutic exercise;Compression bandaging;Therapeutic activities;Other (comment) skin care w/ low ph castor oil and/ or lotion during MLD    Recommended Other Services  fit with appropriate BLE compression garments, HOSD devices that are comfortable and Pt is able to don and doff using assistive devices PRN    Consulted and Agree with Plan of Care  Patient       Patient will benefit from skilled therapeutic intervention in order to improve the following deficits and impairments:  Decreased knowledge of use of DME, Decreased  skin integrity, Increased edema, Impaired flexibility, Pain, Decreased mobility, Decreased activity tolerance, Decreased range of motion, Decreased knowledge of precautions, Difficulty walking, Obesity  Visit Diagnosis: Lymphedema, not elsewhere classified    Problem List Patient Active Problem List   Diagnosis Date Noted  . Elevated parathyroid hormone 06/13/2017  . Vitamin D deficiency 06/13/2017  . Hx of colonic polyps 05/15/2017  . Eustachian tube dysfunction, right 05/15/2017  . Lymphatic edema 05/15/2017  . Hypertension 05/14/2017  . Anemia 05/11/2017  . Arrhythmia 05/11/2017  . Diverticulosis 05/11/2017  . GERD (gastroesophageal reflux disease) 05/11/2017  . Hypothyroidism 05/11/2017  . Lower extremity edema 05/11/2017  . Morbid obesity ([place]) 05/11/2017  . Polycystic ovary syndrome 05/11/2017  . OSA on CPAP 05/11/2017  . Neck pain 05/11/2017  . Allergic rhinitis 05/11/2017  . 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 [place], [place], [address] Phone: [phone]   Fax:  [phone]  Name: Madison Palmer MRN: [id number] Date of Birth: [birth year]

## 2017-06-28 ENCOUNTER — Ambulatory Visit: Payer: BLUE CROSS/BLUE SHIELD | Admitting: Occupational Therapy

## 2017-06-28 DIAGNOSIS — I890 Lymphedema, not elsewhere classified: Secondary | ICD-10-CM | POA: Diagnosis not present

## 2017-06-28 NOTE — Therapy (Signed)
[place] MAIN [facility] SERVICES [address] [place], [place], [address] Phone: [phone]   Fax:  [phone]  Occupational Therapy Treatment  Patient Details  Name: Madison Palmer MRN: [id number] Date of Birth: [birth year] Referring Provider: Debbora Dus, MD   Encounter Date: 06/28/2017  OT End of Session - 06/28/17 1554    Visit Number  14    Number of Visits  36    Date for OT Re-Evaluation  08/20/17    OT Start Time  0100    OT Stop Time  0220    OT Time Calculation (min)  80 min    Equipment Utilized During Treatment  [employer] : Flexitouch sequential pneumatic compression device    Activity Tolerance  Patient tolerated treatment well;No increased pain BLE AROM at all joints limited by body habitus. Pt unable to reach feet without excessive physical effort    Behavior During Therapy  [facility] for tasks assessed/performed       Past Medical History:  Diagnosis Date  . Arrhythmia    heart  . Arthritis   . Cardiomyopathy ([place])   . Chickenpox   . Depression   . Diverticulitis   . GERD (gastroesophageal reflux disease)   . Glaucoma   . Headache   . History of PCOS   . Hyperlipidemia   . Hypertension   . Inflammatory polyps of colon ([place])   . Recurrent UTI   . Thyroid disease   . UTI (urinary tract infection)     Past Surgical History:  Procedure Laterality Date  . BREAST BIOPSY  2015  . CESAREAN SECTION  2004  . INNER EAR SURGERY     ear and sinus surgery  . LAPAROSCOPIC REPAIR AND REMOVAL OF GASTRIC BAND    . OOPHORECTOMY Left   . TONSILLECTOMY AND ADENOIDECTOMY      There were no vitals filed for this visit.  Subjective Assessment - 06/28/17 1551    Subjective   Pt presents for OT visit 14/36 to for Intensive Phase CDT to  BLE. Pt presents with knee length compression   garments in place. Pt states she had acute increase in LLE foor swelling yesterday for no known reason. Pt states , "I'm feeling down today  a little because I thought I was doing so good."    Pertinent History  onset BLE LE 2004 s/p C section, essential HTN, GERD, Hypothyroidism, Obesity; OSA ( compliant w/ C-Pap) Lap Band sx s/p 2009 Lap banc removed 2013, OA    Limitations  limited hip AROM- unable to reach feet and distal legs to don socks, compression garments, lace up street shoews, decreased standing and walking tolerance 2/2 leg swelling and associated pain,     Special Tests  Initial Lymphedema Life Impact Scale [facility]) score measures extent of impairment 2/2 lymphedema " in the pst week" at 49%.     Patient Stated Goals  decrease leg edema and get it under better control; explore alternative compression garments/ devices    Currently in Pain?  Yes    Pain Location  Leg    Pain Orientation  Left    Pain Descriptors / Indicators  Sore    Pain Type  Chronic pain    Pain Onset  Other (comment) chronic                   OT Treatments/Exercises (OP) - 06/28/17 0001      ADLs   ADL Education Given  Yes      Manual Therapy   Manual Therapy  Edema management    Manual therapy comments  skin care to LLE with low ph castor oil throughout MLD    Manual Lymphatic Drainage (MLD)  MLE to LLE utilizing short neck sequence, functional inguinal LN and deep  abdominal pathways.    Compression Bandaging  Pt donned compression stockings after treatment independently w extra time             OT Education - 06/28/17 1554    Education provided  Yes    Education Details  Continued skilled Pt/caregiver education  And LE ADL training throughout visit for lymphedema self care/ home program, including compression wrapping, compression garment and device wear/care, lymphatic pumping ther ex, simple self-MLD, and skin care. Discussed progress towards goals.    Person(s) Educated  Patient    Methods  Explanation;Demonstration    Comprehension  Verbalized understanding;Returned demonstration          OT Long Term Goals -  05/22/17 0951      OT LONG TERM GOAL #1   Title  Pt modified independent w/ lymphedema precautions and prevention principals and strategies using printed reference to limit LE progression and infection risk.    Baseline  Max A    Time  2    Period  Weeks    Status  New    Target Date  -- 10th OT visit      OT LONG TERM GOAL #2   Title  Lymphedema (LE) management/ self-care: Pt able to apply knee length, multi layered, gradient compression wraps with minimal assistance using proper techniques within 2 weeks to achieve optimal limb volume reductions in preparation for fitting compression garments/ devices bilaterally.    Baseline  Max A    Time  2    Period  Weeks    Status  New    Target Date  -- 10th OT visit      OT LONG TERM GOAL #3   Title  Lymphedema (LE) management/ self-care:  Pt to achieve at least 10%  BLE limb volume reductions  during Intensive Phase CDT to limit LE progression, to reduce pain, to improve ADLs performance.    Baseline  Max A    Time  12    Period  Weeks    Status  New    Target Date  08/20/17      OT LONG TERM GOAL #4   Title  Lymphedema (LE) management/ self-care:  Pt to tolerate daily compression wraps, compression garments and/ or HOS devices in keeping w/ prescribed wear regime within 1 week of issue date of each to progress and retain clinical and functional gains and to limit LE progression.    Baseline  Max A    Time  12    Period  Weeks    Status  New    Target Date  08/20/17      OT LONG TERM GOAL #5   Title  Lymphedema (LE) self-management/ self-care:  During Management Phase CDT Pt to sustain current limb volumes within 5% using LE self-care skills and strategies learned during Intensive Phase OT independently to control limb swelling and associated pain, to  limit LE progression, to decrease infection risk and to limit further functional decline.    Baseline  Max A    Time  6    Period  Months    Status  New    Target Date  11/24/17  Plan - 06/28/17 1555    Clinical Impression Statement  No visible increase in Lfoot and leg today. PPPt's photo clearly shows ian acute increase during visit interval. Suspect this may be cause by stocking sliding down creating tourniquet effect at ankle. Pt instructed to check stockings each time she goes to the restroom and adjust upwards as needed. Pt declined use of garment adhesive today. Provided MLD, skin care, and Pt edu throughout session. Pt agrees with plan to complete custom garment measurements for compree length c panty hose  next visit.We'll also consider a lor profile HOS device for improved bed mobility and safer ambulation during HOS.    Occupational performance deficits (Please refer to evaluation for details):  ADL's;IADL's;Work;Leisure;Social Participation;Other body image    Rehab Potential  Good    OT Frequency  3x / week    OT Duration  12 weeks    OT Treatment/Interventions  Self-care/ADL training;DME and/or AE instruction;Manual Therapy;Patient/family education;Manual lymph drainage;Therapeutic exercise;Compression bandaging;Therapeutic activities;Other (comment) skin care w/ low ph castor oil and/ or lotion during MLD    Recommended Other Services  fit with appropriate BLE compression garments, HOSD devices that are comfortable and Pt is able to don and doff using assistive devices PRN    Consulted and Agree with Plan of Care  Patient       Patient will benefit from skilled therapeutic intervention in order to improve the following deficits and impairments:  Decreased knowledge of use of DME, Decreased skin integrity, Increased edema, Impaired flexibility, Pain, Decreased mobility, Decreased activity tolerance, Decreased range of motion, Decreased knowledge of precautions, Difficulty walking, Obesity  Visit Diagnosis: Lymphedema, not elsewhere classified    Problem List Patient Active Problem List   Diagnosis Date Noted  . Elevated parathyroid hormone  06/13/2017  . Vitamin D deficiency 06/13/2017  . Hx of colonic polyps 05/15/2017  . Eustachian tube dysfunction, right 05/15/2017  . Lymphatic edema 05/15/2017  . Hypertension 05/14/2017  . Anemia 05/11/2017  . Arrhythmia 05/11/2017  . Diverticulosis 05/11/2017  . GERD (gastroesophageal reflux disease) 05/11/2017  . Hypothyroidism 05/11/2017  . Lower extremity edema 05/11/2017  . Morbid obesity ([place]) 05/11/2017  . Polycystic ovary syndrome 05/11/2017  . OSA on CPAP 05/11/2017  . Neck pain 05/11/2017  . Allergic rhinitis 05/11/2017  . 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 SERVICES [address] [place], [place], [address] Phone: [phone]   Fax:  [phone]  Name: Madison Palmer MRN: [id number] Date of Birth: 09/08/1965

## 2017-07-02 ENCOUNTER — Ambulatory Visit: Payer: BLUE CROSS/BLUE SHIELD | Attending: Nurse Practitioner | Admitting: Occupational Therapy

## 2017-07-02 DIAGNOSIS — I890 Lymphedema, not elsewhere classified: Secondary | ICD-10-CM | POA: Insufficient documentation

## 2017-07-02 NOTE — Therapy (Signed)
[place] MAIN [facility] SERVICES [address] [place], [place], [address] Phone: [phone]   Fax:  [phone]  Occupational Therapy Treatment  Patient Details  Name: LORELEE Palmer MRN: [id number] Date of Birth: 1965-09-03 Referring Provider: Debbora Dus, MD   Encounter Date: 07/02/2017  OT End of Session - 07/02/17 1400    Visit Number  15    Number of Visits  36    Date for OT Re-Evaluation  08/20/17    OT Start Time  0105    OT Stop Time  0150    OT Time Calculation (min)  45 min    Equipment Utilized During Treatment  T[employer]ile [employer] : Flexitouch sequential pneumatic compression device    Activity Tolerance  Patient tolerated treatment well;No increased pain BLE AROM at all joints limited by body habitus. Pt unable to reach feet without excessive physical effort    Behavior During Therapy  [facility] for tasks assessed/performed       Past Medical History:  Diagnosis Date  . Arrhythmia    heart  . Arthritis   . Cardiomyopathy ([place])   . Chickenpox   . Depression   . Diverticulitis   . GERD (gastroesophageal reflux disease)   . Glaucoma   . Headache   . History of PCOS   . Hyperlipidemia   . Hypertension   . Inflammatory polyps of colon ([place])   . Recurrent UTI   . Thyroid disease   . UTI (urinary tract infection)     Past Surgical History:  Procedure Laterality Date  . BREAST BIOPSY  2015  . CESAREAN SECTION  2004  . INNER EAR SURGERY     ear and sinus surgery  . LAPAROSCOPIC REPAIR AND REMOVAL OF GASTRIC BAND    . OOPHORECTOMY Left   . TONSILLECTOMY AND ADENOIDECTOMY      There were no vitals filed for this visit.  Subjective Assessment - 07/02/17 1356    Subjective   Pt presents for OT visit 15/ 36 to for Intensive Phase CDT to  BLE. Pt presents with knee length compression   garments in place. Pt has no new complaints. Emphasis of today's visit is anatomical measurements for compression  garments.    Pertinent  History  onset BLE LE 2004 s/p C section, essential HTN, GERD, Hypothyroidism, Obesity; OSA ( compliant w/ C-Pap) Lap Band sx s/p 2009 Lap banc removed 2013, OA    Limitations  limited hip AROM- unable to reach feet and distal legs to don socks, compression garments, lace up street shoews, decreased standing and walking tolerance 2/2 leg swelling and associated pain,     Special Tests  Initial Lymphedema Life Impact Scale [facility]) score measures extent of impairment 2/2 lymphedema " in the pst week" at 49%.     Patient Stated Goals  decrease leg edema and get it under better control; explore alternative compression garments/ devices    Currently in Pain?  No/denies    Pain Onset  Other (comment) chronic                   OT Treatments/Exercises (OP) - 07/02/17 0001      ADLs   ADL Education Given  Yes      Manual Therapy   Manual Therapy  Edema management    Manual therapy comments  Emphasis of today's visit is anatomical measurements for compression  garments    Compression Bandaging  Pt donned compression stockings after treatment  independently w extra time             OT Education - 07/02/17 1358    Education provided  Yes    Education Details  Emphasis of Pt edu for LE self care on compression garments function and fit, wear and care regimes and recommendations    .     Person(s) Educated  Patient    Methods  Explanation;Demonstration    Comprehension  Returned demonstration;Verbalized understanding          OT Long Term Goals - 05/22/17 0951      OT LONG TERM GOAL #1   Title  Pt modified independent w/ lymphedema precautions and prevention principals and strategies using printed reference to limit LE progression and infection risk.    Baseline  Max A    Time  2    Period  Weeks    Status  New    Target Date  -- 10th OT visit      OT LONG TERM GOAL #2   Title  Lymphedema (LE) management/ self-care: Pt able to apply knee length, multi layered, gradient  compression wraps with minimal assistance using proper techniques within 2 weeks to achieve optimal limb volume reductions in preparation for fitting compression garments/ devices bilaterally.    Baseline  Max A    Time  2    Period  Weeks    Status  New    Target Date  -- 10th OT visit      OT LONG TERM GOAL #3   Title  Lymphedema (LE) management/ self-care:  Pt to achieve at least 10%  BLE limb volume reductions  during Intensive Phase CDT to limit LE progression, to reduce pain, to improve ADLs performance.    Baseline  Max A    Time  12    Period  Weeks    Status  New    Target Date  08/20/17      OT LONG TERM GOAL #4   Title  Lymphedema (LE) management/ self-care:  Pt to tolerate daily compression wraps, compression garments and/ or HOS devices in keeping w/ prescribed wear regime within 1 week of issue date of each to progress and retain clinical and functional gains and to limit LE progression.    Baseline  Max A    Time  12    Period  Weeks    Status  New    Target Date  08/20/17      OT LONG TERM GOAL #5   Title  Lymphedema (LE) self-management/ self-care:  During Management Phase CDT Pt to sustain current limb volumes within 5% using LE self-care skills and strategies learned during Intensive Phase OT independently to control limb swelling and associated pain, to  limit LE progression, to decrease infection risk and to limit further functional decline.    Baseline  Max A    Time  6    Period  Months    Status  New    Target Date  11/24/17            Plan - 07/02/17 1401    Clinical Impression Statement  Completed most of anatomical measurements for custom, Elvarex classic capri pantyhose, ccl 2. Pt will wear existing girdle to clinic tomorrow in effort to contain massive abdominal pannis for optimal hip and waist measurements. Cont as per POC.    Occupational performance deficits (Please refer to evaluation for details):  ADL's;IADL's;Work;Leisure;Social  Participation;Other body image  Rehab Potential  Good    OT Frequency  3x / week    OT Duration  12 weeks    OT Treatment/Interventions  Self-care/ADL training;DME and/or AE instruction;Manual Therapy;Patient/family education;Manual lymph drainage;Therapeutic exercise;Compression bandaging;Therapeutic activities;Other (comment) skin care w/ low ph castor oil and/ or lotion during MLD    Recommended Other Services  fit with appropriate BLE compression garments, HOSD devices that are comfortable and Pt is able to don and doff using assistive devices PRN    Consulted and Agree with Plan of Care  Patient       Patient will benefit from skilled therapeutic intervention in order to improve the following deficits and impairments:  Decreased knowledge of use of DME, Decreased skin integrity, Increased edema, Impaired flexibility, Pain, Decreased mobility, Decreased activity tolerance, Decreased range of motion, Decreased knowledge of precautions, Difficulty walking, Obesity  Visit Diagnosis: Lymphedema, not elsewhere classified    Problem List Patient Active Problem List   Diagnosis Date Noted  . Elevated parathyroid hormone 06/13/2017  . Vitamin D deficiency 06/13/2017  . Hx of colonic polyps 05/15/2017  . Eustachian tube dysfunction, right 05/15/2017  . Lymphatic edema 05/15/2017  . Hypertension 05/14/2017  . Anemia 05/11/2017  . Arrhythmia 05/11/2017  . Diverticulosis 05/11/2017  . GERD (gastroesophageal reflux disease) 05/11/2017  . Hypothyroidism 05/11/2017  . Lower extremity edema 05/11/2017  . Morbid obesity ([place]) 05/11/2017  . Polycystic ovary syndrome 05/11/2017  . OSA on CPAP 05/11/2017  . Neck pain 05/11/2017  . Allergic rhinitis 05/11/2017  . 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 [place], [place],  [address] Phone: [phone]   Fax:  [phone]  Name: Madison Palmer MRN: [id number] Date of Birth: [birth year]

## 2017-07-03 ENCOUNTER — Ambulatory Visit: Payer: BLUE CROSS/BLUE SHIELD | Admitting: Occupational Therapy

## 2017-07-03 DIAGNOSIS — I890 Lymphedema, not elsewhere classified: Secondary | ICD-10-CM

## 2017-07-04 NOTE — Therapy (Signed)
[place] MAIN [facility] SERVICES [address] [place], [place], [address] Phone: [phone]   Fax:  [phone]  Occupational Therapy Treatment  Patient Details  Name: Madison Palmer MRN: [id number] Date of Birth: [birth year] Referring Provider: Debbora Dus, MD   Encounter Date: 07/03/2017  OT End of Session - 07/03/17 1000    Visit Number  16    Number of Visits  36    Date for OT Re-Evaluation  08/20/17    OT Start Time  0115    OT Stop Time  0200    OT Time Calculation (min)  45 min    Equipment Utilized During Treatment  [employer] : Flexitouch sequential pneumatic compression device    Activity Tolerance  Patient tolerated treatment well;No increased pain BLE AROM at all joints limited by body habitus. Pt unable to reach feet without excessive physical effort    Behavior During Therapy  [facility] for tasks assessed/performed       Past Medical History:  Diagnosis Date  . Arrhythmia    heart  . Arthritis   . Cardiomyopathy ([place])   . Chickenpox   . Depression   . Diverticulitis   . GERD (gastroesophageal reflux disease)   . Glaucoma   . Headache   . History of PCOS   . Hyperlipidemia   . Hypertension   . Inflammatory polyps of colon ([place])   . Recurrent UTI   . Thyroid disease   . UTI (urinary tract infection)     Past Surgical History:  Procedure Laterality Date  . BREAST BIOPSY  2015  . CESAREAN SECTION  2004  . INNER EAR SURGERY     ear and sinus surgery  . LAPAROSCOPIC REPAIR AND REMOVAL OF GASTRIC BAND    . OOPHORECTOMY Left   . TONSILLECTOMY AND ADENOIDECTOMY      There were no vitals filed for this visit.  Subjective Assessment - 07/03/17 0958    Subjective   Pt presents for OT visit 15/ 36 to for Intensive Phase CDT to  BLE. Pt presents with knee length compression   garments in place. Pt has no new complaints. Emphasis of today's visit is anatomical measurements for compression  garments.    Pertinent  History  onset BLE LE 2004 s/p C section, essential HTN, GERD, Hypothyroidism, Obesity; OSA ( compliant w/ C-Pap) Lap Band sx s/p 2009 Lap banc removed 2013, OA    Limitations  limited hip AROM- unable to reach feet and distal legs to don socks, compression garments, lace up street shoews, decreased standing and walking tolerance 2/2 leg swelling and associated pain,     Special Tests  Initial Lymphedema Life Impact Scale [facility]) score measures extent of impairment 2/2 lymphedema " in the pst week" at 49%.     Patient Stated Goals  decrease leg edema and get it under better control; explore alternative compression garments/ devices    Currently in Pain?  No/denies    Pain Onset  Other (comment) chronic                   OT Treatments/Exercises (OP) - 07/04/17 0001      ADLs   ADL Education Given  Yes      Manual Therapy   Manual Therapy  Edema management    Manual therapy comments  Emphasis of today's visit is anatomical measurements for compression  garments    Compression Bandaging  Pt donned compression stockings after treatment  independently w extra time             OT Education - 07/03/17 0959    Education provided  Yes    Education Details  Continued Pt edu for compression garment recommendations and fitting process.    Person(s) Educated  Patient    Methods  Explanation;Demonstration    Comprehension  Verbalized understanding;Returned demonstration          OT Long Term Goals - 05/22/17 0951      OT LONG TERM GOAL #1   Title  Pt modified independent w/ lymphedema precautions and prevention principals and strategies using printed reference to limit LE progression and infection risk.    Baseline  Max A    Time  2    Period  Weeks    Status  New    Target Date  -- 10th OT visit      OT LONG TERM GOAL #2   Title  Lymphedema (LE) management/ self-care: Pt able to apply knee length, multi layered, gradient compression wraps with minimal assistance using  proper techniques within 2 weeks to achieve optimal limb volume reductions in preparation for fitting compression garments/ devices bilaterally.    Baseline  Max A    Time  2    Period  Weeks    Status  New    Target Date  -- 10th OT visit      OT LONG TERM GOAL #3   Title  Lymphedema (LE) management/ self-care:  Pt to achieve at least 10%  BLE limb volume reductions  during Intensive Phase CDT to limit LE progression, to reduce pain, to improve ADLs performance.    Baseline  Max A    Time  12    Period  Weeks    Status  New    Target Date  08/20/17      OT LONG TERM GOAL #4   Title  Lymphedema (LE) management/ self-care:  Pt to tolerate daily compression wraps, compression garments and/ or HOS devices in keeping w/ prescribed wear regime within 1 week of issue date of each to progress and retain clinical and functional gains and to limit LE progression.    Baseline  Max A    Time  12    Period  Weeks    Status  New    Target Date  08/20/17      OT LONG TERM GOAL #5   Title  Lymphedema (LE) self-management/ self-care:  During Management Phase CDT Pt to sustain current limb volumes within 5% using LE self-care skills and strategies learned during Intensive Phase OT independently to control limb swelling and associated pain, to  limit LE progression, to decrease infection risk and to limit further functional decline.    Baseline  Max A    Time  6    Period  Months    Status  New    Target Date  11/24/17            Plan - 07/03/17 1000    Clinical Impression Statement  Completed anatomical measurements for custom Elvarex capri pantyhose with assistance from clinical colleage . Completed measurements for hips and waste for panty portion w/ Pt seated in effort to move abdominal pannus   up into abdominal region. Provided Pt edu for compression garment recommendations and fitting process. Cnot as per POC    Occupational performance deficits (Please refer to evaluation for details):   ADL's;IADL's;Work;Leisure;Social Participation;Other body image    Rehab  Potential  Good    OT Frequency  3x / week    OT Duration  12 weeks    OT Treatment/Interventions  Self-care/ADL training;DME and/or AE instruction;Manual Therapy;Patient/family education;Manual lymph drainage;Therapeutic exercise;Compression bandaging;Therapeutic activities;Other (comment) skin care w/ low ph castor oil and/ or lotion during MLD    Recommended Other Services  fit with appropriate BLE compression garments, HOSD devices that are comfortable and Pt is able to don and doff using assistive devices PRN    Consulted and Agree with Plan of Care  Patient       Patient will benefit from skilled therapeutic intervention in order to improve the following deficits and impairments:  Decreased knowledge of use of DME, Decreased skin integrity, Increased edema, Impaired flexibility, Pain, Decreased mobility, Decreased activity tolerance, Decreased range of motion, Decreased knowledge of precautions, Difficulty walking, Obesity  Visit Diagnosis: Lymphedema, not elsewhere classified    Problem List Patient Active Problem List   Diagnosis Date Noted  . Elevated parathyroid hormone 06/13/2017  . Vitamin D deficiency 06/13/2017  . Hx of colonic polyps 05/15/2017  . Eustachian tube dysfunction, right 05/15/2017  . Lymphatic edema 05/15/2017  . Hypertension 05/14/2017  . Anemia 05/11/2017  . Arrhythmia 05/11/2017  . Diverticulosis 05/11/2017  . GERD (gastroesophageal reflux disease) 05/11/2017  . Hypothyroidism 05/11/2017  . Lower extremity edema 05/11/2017  . Morbid obesity ([place]) 05/11/2017  . Polycystic ovary syndrome 05/11/2017  . OSA on CPAP 05/11/2017  . Neck pain 05/11/2017  . Allergic rhinitis 05/11/2017  . 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

## 2017-07-05 ENCOUNTER — Ambulatory Visit: Payer: BLUE CROSS/BLUE SHIELD | Admitting: Occupational Therapy

## 2017-07-05 DIAGNOSIS — I890 Lymphedema, not elsewhere classified: Secondary | ICD-10-CM | POA: Diagnosis not present

## 2017-07-05 NOTE — Therapy (Signed)
[place] MAIN [facility] SERVICES [address] [place], [place], [address] Phone: [phone]   Fax:  [phone]  Occupational Therapy Treatment  Patient Details  Name: Madison Palmer MRN: [id number] Date of Birth: 1965/05/26 Referring Provider: Debbora Dus, MD   Encounter Date: 07/05/2017  OT End of Session - 07/05/17 1536    Visit Number  17    Number of Visits  36    Date for OT Re-Evaluation  08/20/17    OT Start Time  0110    OT Stop Time  0210    OT Time Calculation (min)  60 min    Equipment Utilized During Treatment  Tactile Medical : Flexitouch sequential pneumatic compression device    Activity Tolerance  Patient tolerated treatment well;No increased pain BLE AROM at all joints limited by body habitus. Pt unable to reach feet without excessive physical effort    Behavior During Therapy  [facility] for tasks assessed/performed       Past Medical History:  Diagnosis Date  . Arrhythmia    heart  . Arthritis   . Cardiomyopathy ([place])   . Chickenpox   . Depression   . Diverticulitis   . GERD (gastroesophageal reflux disease)   . Glaucoma   . Headache   . History of PCOS   . Hyperlipidemia   . Hypertension   . Inflammatory polyps of colon ([place])   . Recurrent UTI   . Thyroid disease   . UTI (urinary tract infection)     Past Surgical History:  Procedure Laterality Date  . BREAST BIOPSY  2015  . CESAREAN SECTION  2004  . INNER EAR SURGERY     ear and sinus surgery  . LAPAROSCOPIC REPAIR AND REMOVAL OF GASTRIC BAND    . OOPHORECTOMY Left   . TONSILLECTOMY AND ADENOIDECTOMY      There were no vitals filed for this visit.  Subjective Assessment - 07/05/17 1537    Subjective   Pt presents for OT visit 17/ 36 to for Intensive Phase CDT to  BLE. Pt presents with knee length compression   garments in place. Pt complains that L ankle pain has increased since receiving steroid injection earlier in the week.    Pertinent  History  onset BLE LE 2004 s/p C section, essential HTN, GERD, Hypothyroidism, Obesity; OSA ( compliant w/ C-Pap) Lap Band sx s/p 2009 Lap banc removed 2013, OA    Limitations  limited hip AROM- unable to reach feet and distal legs to don socks, compression garments, lace up street shoews, decreased standing and walking tolerance 2/2 leg swelling and associated pain,     Special Tests  Initial Lymphedema Life Impact Scale [facility]) score measures extent of impairment 2/2 lymphedema " in the pst week" at 49%.     Patient Stated Goals  decrease leg edema and get it under better control; explore alternative compression garments/ devices    Pain Onset  Other (comment) chronic                   OT Treatments/Exercises (OP) - 07/05/17 0001      ADLs   ADL Education Given  Yes      Manual Therapy   Manual Therapy  Edema management;Compression Bandaging;Manual Lymphatic Drainage (MLD)    Manual therapy comments  skin care during LLE MLD    Manual Lymphatic Drainage (MLD)  MLE to LLE utilizing short neck sequence, functional inguinal LN and deep  abdominal  pathways.    Compression Bandaging  Pt donned compression stockings after treatment independently w extra time             OT Education - 07/05/17 1536    Education provided  Yes    Education Details  Continued skilled Pt/caregiver education  And LE ADL training throughout visit for lymphedema self care/ home program, including compression wrapping, compression garment and device wear/care, lymphatic pumping ther ex, simple self-MLD, and skin care. Discussed progress towards goals.    Person(s) Educated  Patient    Methods  Explanation;Demonstration    Comprehension  Verbalized understanding;Returned demonstration          OT Long Term Goals - 05/22/17 0951      OT LONG TERM GOAL #1   Title  Pt modified independent w/ lymphedema precautions and prevention principals and strategies using printed reference to limit LE  progression and infection risk.    Baseline  Max A    Time  2    Period  Weeks    Status  New    Target Date  -- 10th OT visit      OT LONG TERM GOAL #2   Title  Lymphedema (LE) management/ self-care: Pt able to apply knee length, multi layered, gradient compression wraps with minimal assistance using proper techniques within 2 weeks to achieve optimal limb volume reductions in preparation for fitting compression garments/ devices bilaterally.    Baseline  Max A    Time  2    Period  Weeks    Status  New    Target Date  -- 10th OT visit      OT LONG TERM GOAL #3   Title  Lymphedema (LE) management/ self-care:  Pt to achieve at least 10%  BLE limb volume reductions  during Intensive Phase CDT to limit LE progression, to reduce pain, to improve ADLs performance.    Baseline  Max A    Time  12    Period  Weeks    Status  New    Target Date  08/20/17      OT LONG TERM GOAL #4   Title  Lymphedema (LE) management/ self-care:  Pt to tolerate daily compression wraps, compression garments and/ or HOS devices in keeping w/ prescribed wear regime within 1 week of issue date of each to progress and retain clinical and functional gains and to limit LE progression.    Baseline  Max A    Time  12    Period  Weeks    Status  New    Target Date  08/20/17      OT LONG TERM GOAL #5   Title  Lymphedema (LE) self-management/ self-care:  During Management Phase CDT Pt to sustain current limb volumes within 5% using LE self-care skills and strategies learned during Intensive Phase OT independently to control limb swelling and associated pain, to  limit LE progression, to decrease infection risk and to limit further functional decline.    Baseline  Max A    Time  6    Period  Months    Status  New    Target Date  11/24/17            Plan - 07/05/17 1538    Clinical Impression Statement  Pt 10 minutes late for 60 minute appointment. Pt experiencing increased L ankle joint pain  today, despite  steroid injection earlier in the week. Encouraged Pt to discuss options for pain control  with her doctor. Treatment shifted to RLE today w/ emphasis on mobilizing fluid at large medial thigh lobule towards inguinal and deep abdominal pathways. Skin over lobule is mottled and reddened . Tissue is very dence an with moderate induration , thus difficult to effectively mbile during MLD.  No change in limb volume or tissue mobility after Rx. Cont as per POC.    Occupational performance deficits (Please refer to evaluation for details):  ADL's;IADL's;Work;Leisure;Social Participation;Other body image    Rehab Potential  Good    OT Frequency  3x / week    OT Duration  12 weeks    OT Treatment/Interventions  Self-care/ADL training;DME and/or AE instruction;Manual Therapy;Patient/family education;Manual lymph drainage;Therapeutic exercise;Compression bandaging;Therapeutic activities;Other (comment) skin care w/ low ph castor oil and/ or lotion during MLD    Recommended Other Services  fit with appropriate BLE compression garments, HOSD devices that are comfortable and Pt is able to don and doff using assistive devices PRN    Consulted and Agree with Plan of Care  Patient       Patient will benefit from skilled therapeutic intervention in order to improve the following deficits and impairments:  Decreased knowledge of use of DME, Decreased skin integrity, Increased edema, Impaired flexibility, Pain, Decreased mobility, Decreased activity tolerance, Decreased range of motion, Decreased knowledge of precautions, Difficulty walking, Obesity  Visit Diagnosis: Lymphedema, not elsewhere classified    Problem List Patient Active Problem List   Diagnosis Date Noted  . Elevated parathyroid hormone 06/13/2017  . Vitamin D deficiency 06/13/2017  . Hx of colonic polyps 05/15/2017  . Eustachian tube dysfunction, right 05/15/2017  . Lymphatic edema 05/15/2017  . Hypertension 05/14/2017  . Anemia 05/11/2017  .  Arrhythmia 05/11/2017  . Diverticulosis 05/11/2017  . GERD (gastroesophageal reflux disease) 05/11/2017  . Hypothyroidism 05/11/2017  . Lower extremity edema 05/11/2017  . Morbid obesity ([place]) 05/11/2017  . Polycystic ovary syndrome 05/11/2017  . OSA on CPAP 05/11/2017  . Neck pain 05/11/2017  . Allergic rhinitis 05/11/2017  . 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 [place], [place], [address] Phone: [phone]   Fax:  [phone]  Name: Madison Palmer MRN: [id number] Date of Birth: [birth year]

## 2017-07-06 ENCOUNTER — Encounter: Payer: Self-pay | Admitting: Nurse Practitioner

## 2017-07-06 LAB — T4, FREE: T4,Free (Direct): 1.65

## 2017-07-06 NOTE — Progress Notes (Signed)
Abstracted result and sent to scan  

## 2017-07-09 ENCOUNTER — Ambulatory Visit: Payer: BLUE CROSS/BLUE SHIELD | Admitting: Occupational Therapy

## 2017-07-09 DIAGNOSIS — I890 Lymphedema, not elsewhere classified: Secondary | ICD-10-CM

## 2017-07-09 NOTE — Therapy (Signed)
[place] MAIN [facility] SERVICES [address] [place], [place], [address] Phone: [phone]   Fax:  [phone]  Occupational Therapy Treatment  Patient Details  Name: Madison Palmer MRN: [id number] Date of Birth: 06-18-1965 Referring Provider: Debbora Dus, MD   Encounter Date: 07/09/2017  OT End of Session - 07/09/17 1421    Visit Number  18    Number of Visits  36    Date for OT Re-Evaluation  08/20/17    OT Start Time  0110    OT Stop Time  0215    OT Time Calculation (min)  65 min    Equipment Utilized During Treatment  T[employer]ile Medical : Flexitouch sequential pneumatic compression device    Activity Tolerance  Patient tolerated treatment well;No increased pain BLE AROM at all joints limited by body habitus. Pt unable to reach feet without excessive physical effort    Behavior During Therapy  [facility] for tasks assessed/performed       Past Medical History:  Diagnosis Date  . Arrhythmia    heart  . Arthritis   . Cardiomyopathy ([place])   . Chickenpox   . Depression   . Diverticulitis   . GERD (gastroesophageal reflux disease)   . Glaucoma   . Headache   . History of PCOS   . Hyperlipidemia   . Hypertension   . Inflammatory polyps of colon ([place])   . Recurrent UTI   . Thyroid disease   . UTI (urinary tract infection)     Past Surgical History:  Procedure Laterality Date  . BREAST BIOPSY  2015  . CESAREAN SECTION  2004  . INNER EAR SURGERY     ear and sinus surgery  . LAPAROSCOPIC REPAIR AND REMOVAL OF GASTRIC BAND    . OOPHORECTOMY Left   . TONSILLECTOMY AND ADENOIDECTOMY      There were no vitals filed for this visit.  Subjective Assessment - 07/09/17 1417    Subjective   Pt presents for OT visit 18/ 36 to for Intensive Phase CDT to  BLE. Pt presents with knee length compression  garments in place. Pt reports pain in L anterior ankle has eased off a little" since steroid injection ~ a week ago. No new cconcerns re LE  today.    Pertinent History  onset BLE LE 2004 s/p C section, essential HTN, GERD, Hypothyroidism, Obesity; OSA ( compliant w/ C-Pap) Lap Band sx s/p 2009 Lap banc removed 2013, OA    Limitations  limited hip AROM- unable to reach feet and distal legs to don socks, compression garments, lace up street shoews, decreased standing and walking tolerance 2/2 leg swelling and associated pain,     Special Tests  Initial Lymphedema Life Impact Scale [facility]) score measures extent of impairment 2/2 lymphedema " in the pst week" at 49%.     Patient Stated Goals  decrease leg edema and get it under better control; explore alternative compression garments/ devices    Currently in Pain?  Yes    Pain Location  Ankle    Pain Orientation  Left    Pain Descriptors / Indicators  Sore;Aching    Pain Type  Chronic pain    Pain Onset  -- chronic    Pain Frequency  Intermittent with movement and weight bearing                   OT Treatments/Exercises (OP) - 07/09/17 0001      ADLs  ADL Education Given  Yes      Manual Therapy   Manual Therapy  Edema management;Compression Bandaging;Manual Lymphatic Drainage (MLD)    Manual therapy comments  skin care during LLE MLD    Manual Lymphatic Drainage (MLD)  MLE to RLE utilizing short neck sequence, functional inguinal LN and deep  abdominal pathways.    Compression Bandaging  Pt donned compression stockings after treatment independently w extra time             OT Education - 07/09/17 1420    Education provided  Yes    Education Details   Continued skilled Pt/caregiver education  And LE ADL training throughout visit for lymphedema self care/ home program, including compression wrapping, compression garment and device wear/care, lymphatic pumping ther ex, simple self-MLD, and skin care. Discussed progress towards goals.    Person(s) Educated  Patient    Methods  Explanation;Demonstration    Comprehension  Verbalized understanding;Returned  demonstration          OT Long Term Goals - 05/22/17 0951      OT LONG TERM GOAL #1   Title  Pt modified independent w/ lymphedema precautions and prevention principals and strategies using printed reference to limit LE progression and infection risk.    Baseline  Max A    Time  2    Period  Weeks    Status  New    Target Date  -- 10th OT visit      OT LONG TERM GOAL #2   Title  Lymphedema (LE) management/ self-care: Pt able to apply knee length, multi layered, gradient compression wraps with minimal assistance using proper techniques within 2 weeks to achieve optimal limb volume reductions in preparation for fitting compression garments/ devices bilaterally.    Baseline  Max A    Time  2    Period  Weeks    Status  New    Target Date  -- 10th OT visit      OT LONG TERM GOAL #3   Title  Lymphedema (LE) management/ self-care:  Pt to achieve at least 10%  BLE limb volume reductions  during Intensive Phase CDT to limit LE progression, to reduce pain, to improve ADLs performance.    Baseline  Max A    Time  12    Period  Weeks    Status  New    Target Date  08/20/17      OT LONG TERM GOAL #4   Title  Lymphedema (LE) management/ self-care:  Pt to tolerate daily compression wraps, compression garments and/ or HOS devices in keeping w/ prescribed wear regime within 1 week of issue date of each to progress and retain clinical and functional gains and to limit LE progression.    Baseline  Max A    Time  12    Period  Weeks    Status  New    Target Date  08/20/17      OT LONG TERM GOAL #5   Title  Lymphedema (LE) self-management/ self-care:  During Management Phase CDT Pt to sustain current limb volumes within 5% using LE self-care skills and strategies learned during Intensive Phase OT independently to control limb swelling and associated pain, to  limit LE progression, to decrease infection risk and to limit further functional decline.    Baseline  Max A    Time  6    Period   Months    Status  New  Target Date  11/24/17            Plan - 07/09/17 1422    Clinical Impression Statement  Pt 10 minutes late for 60 minute appointment. Provided MLD to RLE todayw/ emphasis on decongesting l;arge, dense , medial thigh lobule. No change palpable or visible after session. Dense congestion in the R thigh has been markedly less responsive to MLD and compression than LLE. Cont as per POC.    Occupational performance deficits (Please refer to evaluation for details):  ADL's;IADL's;Work;Leisure;Social Participation;Other body image    Rehab Potential  Good    OT Frequency  3x / week    OT Duration  12 weeks    OT Treatment/Interventions  Self-care/ADL training;DME and/or AE instruction;Manual Therapy;Patient/family education;Manual lymph drainage;Therapeutic exercise;Compression bandaging;Therapeutic activities;Other (comment) skin care w/ low ph castor oil and/ or lotion during MLD    Recommended Other Services  fit with appropriate BLE compression garments, HOSD devices that are comfortable and Pt is able to don and doff using assistive devices PRN    Consulted and Agree with Plan of Care  Patient       Patient will benefit from skilled therapeutic intervention in order to improve the following deficits and impairments:  Decreased knowledge of use of DME, Decreased skin integrity, Increased edema, Impaired flexibility, Pain, Decreased mobility, Decreased activity tolerance, Decreased range of motion, Decreased knowledge of precautions, Difficulty walking, Obesity  Visit Diagnosis: Lymphedema, not elsewhere classified    Problem List Patient Active Problem List   Diagnosis Date Noted  . Elevated parathyroid hormone 06/13/2017  . Vitamin D deficiency 06/13/2017  . Hx of colonic polyps 05/15/2017  . Eustachian tube dysfunction, right 05/15/2017  . Lymphatic edema 05/15/2017  . Hypertension 05/14/2017  . Anemia 05/11/2017  . Arrhythmia 05/11/2017  .  Diverticulosis 05/11/2017  . GERD (gastroesophageal reflux disease) 05/11/2017  . Hypothyroidism 05/11/2017  . Lower extremity edema 05/11/2017  . Morbid obesity ([place]) 05/11/2017  . Polycystic ovary syndrome 05/11/2017  . OSA on CPAP 05/11/2017  . Neck pain 05/11/2017  . Allergic rhinitis 05/11/2017  . 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 [place], [place], [address] Phone: [phone]   Fax:  [phone]  Name: Madison Palmer MRN: [id number] Date of Birth: January 19, 1966

## 2017-07-10 ENCOUNTER — Ambulatory Visit: Payer: BLUE CROSS/BLUE SHIELD | Admitting: Occupational Therapy

## 2017-07-10 DIAGNOSIS — I890 Lymphedema, not elsewhere classified: Secondary | ICD-10-CM

## 2017-07-10 NOTE — Therapy (Signed)
[place] MAIN [facility] SERVICES [address] [place], [place], [address] Phone: [phone]   Fax:  [phone]  Occupational Therapy Treatment  Patient Details  Name: Madison Palmer MRN: [id number] Date of Birth: 11-Dec-1965 Referring Provider: Debbora Dus, MD   Encounter Date: 07/10/2017  OT End of Session - 07/10/17 1647    Visit Number  19    Number of Visits  36    Date for OT Re-Evaluation  08/20/17    OT Start Time  0130    OT Stop Time  0250    OT Time Calculation (min)  80 min    Activity Tolerance  Patient tolerated treatment well;No increased pain BLE AROM at all joints limited by body habitus. Pt unable to reach feet without excessive physical effort    Behavior During Therapy  [facility] for tasks assessed/performed       Past Medical History:  Diagnosis Date  . Arrhythmia    heart  . Arthritis   . Cardiomyopathy ([place])   . Chickenpox   . Depression   . Diverticulitis   . GERD (gastroesophageal reflux disease)   . Glaucoma   . Headache   . History of PCOS   . Hyperlipidemia   . Hypertension   . Inflammatory polyps of colon ([place])   . Recurrent UTI   . Thyroid disease   . UTI (urinary tract infection)     Past Surgical History:  Procedure Laterality Date  . BREAST BIOPSY  2015  . CESAREAN SECTION  2004  . INNER EAR SURGERY     ear and sinus surgery  . LAPAROSCOPIC REPAIR AND REMOVAL OF GASTRIC BAND    . OOPHORECTOMY Left   . TONSILLECTOMY AND ADENOIDECTOMY      There were no vitals filed for this visit.  Subjective Assessment - 07/10/17 1644    Subjective   Pt presents for OT visit 19/ 36 to for Intensive Phase CDT to  BLE. Pt presents with knee length compression  garments in place. Pt reports she needs to cx upcoming appointment due to overnight travel. Pt has no new complaints. She tells me she has not heard from DME vendor re recently ordered LE garments.    Pertinent History  onset BLE LE 2004 s/p C  section, essential HTN, GERD, Hypothyroidism, Obesity; OSA ( compliant w/ C-Pap) Lap Band sx s/p 2009 Lap banc removed 2013, OA    Limitations  limited hip AROM- unable to reach feet and distal legs to don socks, compression garments, lace up street shoews, decreased standing and walking tolerance 2/2 leg swelling and associated pain,     Special Tests  Initial Lymphedema Life Impact Scale [facility]) score measures extent of impairment 2/2 lymphedema " in the pst week" at 49%.     Patient Stated Goals  decrease leg edema and get it under better control; explore alternative compression garments/ devices    Currently in Pain?  No/denies    Pain Onset  -- chronic                   OT Treatments/Exercises (OP) - 07/10/17 0001      ADLs   ADL Education Given  Yes      Manual Therapy   Manual Therapy  Edema management;Manual Lymphatic Drainage (MLD)    Manual therapy comments  skin care during LLE MLD    Manual Lymphatic Drainage (MLD)  MLE to RLE utilizing short neck sequence, functional inguinal  LN and deep  abdominal pathways.    Compression Bandaging  Pt donned compression stockings after treatment independently w extra time             OT Education - 07/10/17 1645    Education provided  Yes    Education Details  Continued skilled Pt/caregiver education  And LE ADL training throughout visit for lymphedema self care/ home program, including compression wrapping, compression garment and device wear/care, lymphatic pumping ther ex, simple self-MLD, and skin care. Discussed progress towards goals.    Person(s) Educated  Patient    Methods  Explanation;Demonstration    Comprehension  Verbalized understanding;Returned demonstration          OT Long Term Goals - 05/22/17 0951      OT LONG TERM GOAL #1   Title  Pt modified independent w/ lymphedema precautions and prevention principals and strategies using printed reference to limit LE progression and infection risk.     Baseline  Max A    Time  2    Period  Weeks    Status  New    Target Date  -- 10th OT visit      OT LONG TERM GOAL #2   Title  Lymphedema (LE) management/ self-care: Pt able to apply knee length, multi layered, gradient compression wraps with minimal assistance using proper techniques within 2 weeks to achieve optimal limb volume reductions in preparation for fitting compression garments/ devices bilaterally.    Baseline  Max A    Time  2    Period  Weeks    Status  New    Target Date  -- 10th OT visit      OT LONG TERM GOAL #3   Title  Lymphedema (LE) management/ self-care:  Pt to achieve at least 10%  BLE limb volume reductions  during Intensive Phase CDT to limit LE progression, to reduce pain, to improve ADLs performance.    Baseline  Max A    Time  12    Period  Weeks    Status  New    Target Date  08/20/17      OT LONG TERM GOAL #4   Title  Lymphedema (LE) management/ self-care:  Pt to tolerate daily compression wraps, compression garments and/ or HOS devices in keeping w/ prescribed wear regime within 1 week of issue date of each to progress and retain clinical and functional gains and to limit LE progression.    Baseline  Max A    Time  12    Period  Weeks    Status  New    Target Date  08/20/17      OT LONG TERM GOAL #5   Title  Lymphedema (LE) self-management/ self-care:  During Management Phase CDT Pt to sustain current limb volumes within 5% using LE self-care skills and strategies learned during Intensive Phase OT independently to control limb swelling and associated pain, to  limit LE progression, to decrease infection risk and to limit further functional decline.    Baseline  Max A    Time  6    Period  Months    Status  New    Target Date  11/24/17            Plan - 07/10/17 1647    Clinical Impression Statement  Pt arrived an hour early for scheduled appointment. Pt consistently wears compression garments to therapy sessions. Skin condition is well  hydrated and edema is well controlled in  BLE. Pt is ful;ly engaged throughout therapy session and participates in all therapeutic activities. Pt tolerated MLD and was able to dont compression garments without assistive devices today. Cont as per POC.    Occupational performance deficits (Please refer to evaluation for details):  ADL's;IADL's;Work;Leisure;Social Participation;Other body image    Rehab Potential  Good    OT Frequency  3x / week    OT Duration  12 weeks    OT Treatment/Interventions  Self-care/ADL training;DME and/or AE instruction;Manual Therapy;Patient/family education;Manual lymph drainage;Therapeutic exercise;Compression bandaging;Therapeutic activities;Other (comment) skin care w/ low ph castor oil and/ or lotion during MLD    Recommended Other Services  fit with appropriate BLE compression garments, HOSD devices that are comfortable and Pt is able to don and doff using assistive devices PRN    Consulted and Agree with Plan of Care  Patient       Patient will benefit from skilled therapeutic intervention in order to improve the following deficits and impairments:  Decreased knowledge of use of DME, Decreased skin integrity, Increased edema, Impaired flexibility, Pain, Decreased mobility, Decreased activity tolerance, Decreased range of motion, Decreased knowledge of precautions, Difficulty walking, Obesity  Visit Diagnosis: Lymphedema, not elsewhere classified    Problem List Patient Active Problem List   Diagnosis Date Noted  . Elevated parathyroid hormone 06/13/2017  . Vitamin D deficiency 06/13/2017  . Hx of colonic polyps 05/15/2017  . Eustachian tube dysfunction, right 05/15/2017  . Lymphatic edema 05/15/2017  . Hypertension 05/14/2017  . Anemia 05/11/2017  . Arrhythmia 05/11/2017  . Diverticulosis 05/11/2017  . GERD (gastroesophageal reflux disease) 05/11/2017  . Hypothyroidism 05/11/2017  . Lower extremity edema 05/11/2017  . Morbid obesity ([name]) 05/11/2017   . Polycystic ovary syndrome 05/11/2017  . OSA on CPAP 05/11/2017  . Neck pain 05/11/2017  . Allergic rhinitis 05/11/2017  . 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 [place], [place], [address] Phone: [phone]   Fax:  [phone]  Name: ERNESTA TRABERT MRN: [id number] Date of Birth: 1965/10/31

## 2017-07-12 ENCOUNTER — Ambulatory Visit: Payer: BLUE CROSS/BLUE SHIELD | Admitting: Occupational Therapy

## 2017-07-12 DIAGNOSIS — I890 Lymphedema, not elsewhere classified: Secondary | ICD-10-CM | POA: Diagnosis not present

## 2017-07-12 NOTE — Therapy (Signed)
[place] MAIN [facility] SERVICES [address] [place], [place], [address] Phone: [phone]   Fax:  [phone]  Occupational Therapy Treatment  Patient Details  Name: Madison Palmer MRN: [id number] Date of Birth: December 31, 1965 Referring Provider: Debbora Dus, MD   Encounter Date: 07/12/2017  OT End of Session - 07/12/17 1446    Visit Number  20    Number of Visits  36    Date for OT Re-Evaluation  08/20/17    OT Start Time  0115    OT Stop Time  0215    OT Time Calculation (min)  60 min    Activity Tolerance  Patient tolerated treatment well;No increased pain BLE AROM at all joints limited by body habitus. Pt unable to reach feet without excessive physical effort    Behavior During Therapy  [facility] for tasks assessed/performed       Past Medical History:  Diagnosis Date  . Arrhythmia    heart  . Arthritis   . Cardiomyopathy ([place])   . Chickenpox   . Depression   . Diverticulitis   . GERD (gastroesophageal reflux disease)   . Glaucoma   . Headache   . History of PCOS   . Hyperlipidemia   . Hypertension   . Inflammatory polyps of colon ([place])   . Recurrent UTI   . Thyroid disease   . UTI (urinary tract infection)     Past Surgical History:  Procedure Laterality Date  . BREAST BIOPSY  2015  . CESAREAN SECTION  2004  . INNER EAR SURGERY     ear and sinus surgery  . LAPAROSCOPIC REPAIR AND REMOVAL OF GASTRIC BAND    . OOPHORECTOMY Left   . TONSILLECTOMY AND ADENOIDECTOMY      There were no vitals filed for this visit.  Subjective Assessment - 07/12/17 1444    Subjective   Pt presents for OT visit 20/ 36 to for Intensive Phase CDT to  BLE. Pt presents with knee length compression  garments in place. Pt reports that DME vendor contacted her and her insurance approved her pump and it's shipped.    Pertinent History  onset BLE LE 2004 s/p C section, essential HTN, GERD, Hypothyroidism, Obesity; OSA ( compliant w/ C-Pap) Lap  Band sx s/p 2009 Lap banc removed 2013, OA    Limitations  limited hip AROM- unable to reach feet and distal legs to don socks, compression garments, lace up street shoews, decreased standing and walking tolerance 2/2 leg swelling and associated pain,     Special Tests  Initial Lymphedema Life Impact Scale [facility]) score measures extent of impairment 2/2 lymphedema " in the pst week" at 49%.     Patient Stated Goals  decrease leg edema and get it under better control; explore alternative compression garments/ devices    Currently in Pain?  No/denies    Pain Onset  -- chronic                   OT Treatments/Exercises (OP) - 07/12/17 0001      ADLs   ADL Education Given  Yes      Manual Therapy   Manual Therapy  Edema management;Manual Lymphatic Drainage (MLD)    Manual therapy comments  skin care during LLE MLD    Manual Lymphatic Drainage (MLD)  MLE to RLE utilizing short neck sequence, functional inguinal LN and deep  abdominal pathways.    Compression Bandaging  Pt donned compression stockings  after treatment independently w extra time             OT Education - 07/12/17 1445    Education provided  Yes    Education Details  Continued skilled Pt/caregiver education  And LE ADL training throughout visit for lymphedema self care/ home program, including compression wrapping, compression garment and device wear/care, lymphatic pumping ther ex, simple self-MLD, and skin care. Discussed progress towards goals.    Person(s) Educated  Patient    Methods  Explanation;Demonstration    Comprehension  Returned demonstration;Verbalized understanding          OT Long Term Goals - 05/22/17 0951      OT LONG TERM GOAL #1   Title  Pt modified independent w/ lymphedema precautions and prevention principals and strategies using printed reference to limit LE progression and infection risk.    Baseline  Max A    Time  2    Period  Weeks    Status  New    Target Date  -- 10th OT  visit      OT LONG TERM GOAL #2   Title  Lymphedema (LE) management/ self-care: Pt able to apply knee length, multi layered, gradient compression wraps with minimal assistance using proper techniques within 2 weeks to achieve optimal limb volume reductions in preparation for fitting compression garments/ devices bilaterally.    Baseline  Max A    Time  2    Period  Weeks    Status  New    Target Date  -- 10th OT visit      OT LONG TERM GOAL #3   Title  Lymphedema (LE) management/ self-care:  Pt to achieve at least 10%  BLE limb volume reductions  during Intensive Phase CDT to limit LE progression, to reduce pain, to improve ADLs performance.    Baseline  Max A    Time  12    Period  Weeks    Status  New    Target Date  08/20/17      OT LONG TERM GOAL #4   Title  Lymphedema (LE) management/ self-care:  Pt to tolerate daily compression wraps, compression garments and/ or HOS devices in keeping w/ prescribed wear regime within 1 week of issue date of each to progress and retain clinical and functional gains and to limit LE progression.    Baseline  Max A    Time  12    Period  Weeks    Status  New    Target Date  08/20/17      OT LONG TERM GOAL #5   Title  Lymphedema (LE) self-management/ self-care:  During Management Phase CDT Pt to sustain current limb volumes within 5% using LE self-care skills and strategies learned during Intensive Phase OT independently to control limb swelling and associated pain, to  limit LE progression, to decrease infection risk and to limit further functional decline.    Baseline  Max A    Time  6    Period  Months    Status  New    Target Date  11/24/17            Plan - 07/12/17 1447    Clinical Impression Statement  Pt 15 minutes late for a 60 minute session. Provided MLD and skin care to RLE wnr Pt tolerated without difficulty. Pt managing swelling well between sessions. Provided Pt edu for LE self care throughout session about how to integrate  pump into daily  routine. Also discussed how HOS devices  function and provided wear and care guidelines. Cont as per POC.    Occupational performance deficits (Please refer to evaluation for details):  ADL's;IADL's;Work;Leisure;Social Participation;Other body image    Rehab Potential  Good    OT Frequency  3x / week    OT Duration  12 weeks    OT Treatment/Interventions  Self-care/ADL training;DME and/or AE instruction;Manual Therapy;Patient/family education;Manual lymph drainage;Therapeutic exercise;Compression bandaging;Therapeutic activities;Other (comment) skin care w/ low ph castor oil and/ or lotion during MLD    Recommended Other Services  fit with appropriate BLE compression garments, HOSD devices that are comfortable and Pt is able to don and doff using assistive devices PRN    Consulted and Agree with Plan of Care  Patient       Patient will benefit from skilled therapeutic intervention in order to improve the following deficits and impairments:  Decreased knowledge of use of DME, Decreased skin integrity, Increased edema, Impaired flexibility, Pain, Decreased mobility, Decreased activity tolerance, Decreased range of motion, Decreased knowledge of precautions, Difficulty walking, Obesity  Visit Diagnosis: Lymphedema, not elsewhere classified    Problem List Patient Active Problem List   Diagnosis Date Noted  . Elevated parathyroid hormone 06/13/2017  . Vitamin D deficiency 06/13/2017  . Hx of colonic polyps 05/15/2017  . Eustachian tube dysfunction, right 05/15/2017  . Lymphatic edema 05/15/2017  . Hypertension 05/14/2017  . Anemia 05/11/2017  . Arrhythmia 05/11/2017  . Diverticulosis 05/11/2017  . GERD (gastroesophageal reflux disease) 05/11/2017  . Hypothyroidism 05/11/2017  . Lower extremity edema 05/11/2017  . Morbid obesity ([place]) 05/11/2017  . Polycystic ovary syndrome 05/11/2017  . OSA on CPAP 05/11/2017  . Neck pain 05/11/2017  . Allergic rhinitis 05/11/2017  .  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

## 2017-07-16 ENCOUNTER — Ambulatory Visit: Payer: BLUE CROSS/BLUE SHIELD | Admitting: Occupational Therapy

## 2017-07-16 DIAGNOSIS — I890 Lymphedema, not elsewhere classified: Secondary | ICD-10-CM

## 2017-07-16 NOTE — Therapy (Signed)
[place] MAIN [facility] SERVICES [address] [place], [place], [address] Phone: [phone]   Fax:  [phone]  Occupational Therapy Treatment  Patient Details  Name: Madison Palmer MRN: [id number] Date of Birth: 06-Feb-1966 Referring Provider: Debbora Dus, MD   Encounter Date: 07/16/2017  OT End of Session - 07/16/17 1616    Visit Number  21    Number of Visits  36    Date for OT Re-Evaluation  08/20/17    OT Start Time  0115    OT Stop Time  0215    OT Time Calculation (min)  60 min    Activity Tolerance  Patient tolerated treatment well;No increased pain BLE AROM at all joints limited by body habitus. Pt unable to reach feet without excessive physical effort    Behavior During Therapy  [facility] for tasks assessed/performed       Past Medical History:  Diagnosis Date  . Arrhythmia    heart  . Arthritis   . Cardiomyopathy ([place])   . Chickenpox   . Depression   . Diverticulitis   . GERD (gastroesophageal reflux disease)   . Glaucoma   . Headache   . History of PCOS   . Hyperlipidemia   . Hypertension   . Inflammatory polyps of colon ([place])   . Recurrent UTI   . Thyroid disease   . UTI (urinary tract infection)     Past Surgical History:  Procedure Laterality Date  . BREAST BIOPSY  2015  . CESAREAN SECTION  2004  . INNER EAR SURGERY     ear and sinus surgery  . LAPAROSCOPIC REPAIR AND REMOVAL OF GASTRIC BAND    . OOPHORECTOMY Left   . TONSILLECTOMY AND ADENOIDECTOMY      There were no vitals filed for this visit.  Subjective Assessment - 07/16/17 1612    Subjective   Pt presents for OT visit 21/ 36 to for Intensive Phase CDT to  BLE. Pt presents without compression garments in place. Pt leaves after OT session for drive to [place]. Pt reports increased LLE pain and swelling in ankle over the weekend    Pertinent History  onset BLE LE 2004 s/p C section, essential HTN, GERD, Hypothyroidism, Obesity; OSA ( compliant w/  C-Pap) Lap Band sx s/p 2009 Lap banc removed 2013, OA    Limitations  limited hip AROM- unable to reach feet and distal legs to don socks, compression garments, lace up street shoews, decreased standing and walking tolerance 2/2 leg swelling and associated pain,     Special Tests  Initial Lymphedema Life Impact Scale [facility]) score measures extent of impairment 2/2 lymphedema " in the pst week" at 49%.     Patient Stated Goals  decrease leg edema and get it under better control; explore alternative compression garments/ devices    Currently in Pain?  No/denies    Pain Onset  -- chronic                   OT Treatments/Exercises (OP) - 07/16/17 0001      ADLs   ADL Education Given  Yes      Manual Therapy   Manual Therapy  Edema management;Manual Lymphatic Drainage (MLD)    Manual therapy comments  skin care during LLE MLD    Edema Management  HOS device assessment and pt edu for wear and care    Manual Lymphatic Drainage (MLD)  MLD to LLE utilizing short neck sequence,  functional inguinal LN and deep  abdominal pathways.    Compression Bandaging  Pt donned compression stockings after treatment independently w extra time             OT Education - 07/16/17 1615    Education Details  Pt edu for care and use of existing HOS devices. Pt understands rational and wear and care schedule by end of Rx session.    Person(s) Educated  Patient    Methods  Explanation;Demonstration    Comprehension  Verbalized understanding;Returned demonstration          OT Long Term Goals - 05/22/17 0951      OT LONG TERM GOAL #1   Title  Pt modified independent w/ lymphedema precautions and prevention principals and strategies using printed reference to limit LE progression and infection risk.    Baseline  Max A    Time  2    Period  Weeks    Status  New    Target Date  -- 10th OT visit      OT LONG TERM GOAL #2   Title  Lymphedema (LE) management/ self-care: Pt able to apply knee  length, multi layered, gradient compression wraps with minimal assistance using proper techniques within 2 weeks to achieve optimal limb volume reductions in preparation for fitting compression garments/ devices bilaterally.    Baseline  Max A    Time  2    Period  Weeks    Status  New    Target Date  -- 10th OT visit      OT LONG TERM GOAL #3   Title  Lymphedema (LE) management/ self-care:  Pt to achieve at least 10%  BLE limb volume reductions  during Intensive Phase CDT to limit LE progression, to reduce pain, to improve ADLs performance.    Baseline  Max A    Time  12    Period  Weeks    Status  New    Target Date  08/20/17      OT LONG TERM GOAL #4   Title  Lymphedema (LE) management/ self-care:  Pt to tolerate daily compression wraps, compression garments and/ or HOS devices in keeping w/ prescribed wear regime within 1 week of issue date of each to progress and retain clinical and functional gains and to limit LE progression.    Baseline  Max A    Time  12    Period  Weeks    Status  New    Target Date  08/20/17      OT LONG TERM GOAL #5   Title  Lymphedema (LE) self-management/ self-care:  During Management Phase CDT Pt to sustain current limb volumes within 5% using LE self-care skills and strategies learned during Intensive Phase OT independently to control limb swelling and associated pain, to  limit LE progression, to decrease infection risk and to limit further functional decline.    Baseline  Max A    Time  6    Period  Months    Status  New    Target Date  11/24/17            Plan - 07/16/17 1616    Clinical Impression Statement  Pt presenting with increased L ankle and distal leg swelling today. Pt most likely less compliant with compression over the weekend, then leg swelling prevented garments from     fotting. Pt c/o joint pain in ankle over the weekend as well, so iit's likely that inflamation  2/2 OA is also contributing to limb swelling and reduced movement  in ankle,. Pt tolerate edu for HOS devices. They are a bit large, but fit well with elastic covers in place. Pt able to  don and doff with extra time and antifriction sock. Vendor reports compression garments have been ordered. Cont as per POC.    Occupational performance deficits (Please refer to evaluation for details):  ADL's;IADL's;Work;Leisure;Social Participation;Other body image    Rehab Potential  Good    OT Frequency  3x / week    OT Duration  12 weeks    OT Treatment/Interventions  Self-care/ADL training;DME and/or AE instruction;Manual Therapy;Patient/family education;Manual lymph drainage;Therapeutic exercise;Compression bandaging;Therapeutic activities;Other (comment) skin care w/ low ph castor oil and/ or lotion during MLD    Recommended Other Services  fit with appropriate BLE compression garments, HOSD devices that are comfortable and Pt is able to don and doff using assistive devices PRN    Consulted and Agree with Plan of Care  Patient       Patient will benefit from skilled therapeutic intervention in order to improve the following deficits and impairments:  Decreased knowledge of use of DME, Decreased skin integrity, Increased edema, Impaired flexibility, Pain, Decreased mobility, Decreased activity tolerance, Decreased range of motion, Decreased knowledge of precautions, Difficulty walking, Obesity  Visit Diagnosis: Lymphedema, not elsewhere classified    Problem List Patient Active Problem List   Diagnosis Date Noted  . Elevated parathyroid hormone 06/13/2017  . Vitamin D deficiency 06/13/2017  . Hx of colonic polyps 05/15/2017  . Eustachian tube dysfunction, right 05/15/2017  . Lymphatic edema 05/15/2017  . Hypertension 05/14/2017  . Anemia 05/11/2017  . Arrhythmia 05/11/2017  . Diverticulosis 05/11/2017  . GERD (gastroesophageal reflux disease) 05/11/2017  . Hypothyroidism 05/11/2017  . Lower extremity edema 05/11/2017  . Morbid obesity ([place]) 05/11/2017  .  Polycystic ovary syndrome 05/11/2017  . OSA on CPAP 05/11/2017  . Neck pain 05/11/2017  . Allergic rhinitis 05/11/2017  . 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 [place], [place], [address] Phone: [phone]   Fax:  [phone]  Name: Madison Palmer MRN: [id number] Date of Birth: December 17, 1965

## 2017-07-17 ENCOUNTER — Encounter: Payer: BLUE CROSS/BLUE SHIELD | Admitting: Occupational Therapy

## 2017-07-17 LAB — HM DEXA SCAN

## 2017-07-19 ENCOUNTER — Ambulatory Visit: Payer: BLUE CROSS/BLUE SHIELD | Admitting: Occupational Therapy

## 2017-07-19 DIAGNOSIS — I890 Lymphedema, not elsewhere classified: Secondary | ICD-10-CM | POA: Diagnosis not present

## 2017-07-23 ENCOUNTER — Ambulatory Visit: Payer: BLUE CROSS/BLUE SHIELD | Admitting: Occupational Therapy

## 2017-07-23 NOTE — Therapy (Signed)
[place] MAIN [place] [facility] SERVICES [address] [place], [place], [address] Phone: [phone]   Fax:  [phone]  Occupational Therapy Treatment  Patient Details  Name: Madison Palmer MRN: [id number] Date of Birth: 08-20-1965 Referring Provider: Debbora Dus, MD   Encounter Date: 07/19/2017    Past Medical History:  Diagnosis Date  . Arrhythmia    heart  . Arthritis   . Cardiomyopathy ([place])   . Chickenpox   . Depression   . Diverticulitis   . GERD (gastroesophageal reflux disease)   . Glaucoma   . Headache   . History of PCOS   . Hyperlipidemia   . Hypertension   . Inflammatory polyps of colon ([place])   . Recurrent UTI   . Thyroid disease   . UTI (urinary tract infection)     Past Surgical History:  Procedure Laterality Date  . BREAST BIOPSY  2015  . CESAREAN SECTION  2004  . INNER EAR SURGERY     ear and sinus surgery  . LAPAROSCOPIC REPAIR AND REMOVAL OF GASTRIC BAND    . OOPHORECTOMY Left   . TONSILLECTOMY AND ADENOIDECTOMY      There were no vitals filed for this visit.                OT Treatments/Exercises (OP) - 07/23/17 0001      Manual Therapy   Manual Therapy  Edema management;Manual Lymphatic Drainage (MLD)    Manual therapy comments  skin care during LLE MLD    Manual Lymphatic Drainage (MLD)  abbreviatedRLE MLD as established    Compression Bandaging  Pt donned compression stockings after treatment independently w extra time                  OT Long Term Goals - 05/22/17 0951      OT LONG TERM GOAL #1   Title  Pt modified independent w/ lymphedema precautions and prevention principals and strategies using printed reference to limit LE progression and infection risk.    Baseline  Max A    Time  2    Period  Weeks    Status  New    Target Date  -- 10th OT visit      OT LONG TERM GOAL #2   Title  Lymphedema (LE) management/ self-care: Pt able to apply knee length, multi  layered, gradient compression wraps with minimal assistance using proper techniques within 2 weeks to achieve optimal limb volume reductions in preparation for fitting compression garments/ devices bilaterally.    Baseline  Max A    Time  2    Period  Weeks    Status  New    Target Date  -- 10th OT visit      OT LONG TERM GOAL #3   Title  Lymphedema (LE) management/ self-care:  Pt to achieve at least 10%  BLE limb volume reductions  during Intensive Phase CDT to limit LE progression, to reduce pain, to improve ADLs performance.    Baseline  Max A    Time  12    Period  Weeks    Status  New    Target Date  08/20/17      OT LONG TERM GOAL #4   Title  Lymphedema (LE) management/ self-care:  Pt to tolerate daily compression wraps, compression garments and/ or HOS devices in keeping w/ prescribed wear regime within 1 week of issue date of each to progress and retain clinical  and functional gains and to limit LE progression.    Baseline  Max A    Time  12    Period  Weeks    Status  New    Target Date  08/20/17      OT LONG TERM GOAL #5   Title  Lymphedema (LE) self-management/ self-care:  During Management Phase CDT Pt to sustain current limb volumes within 5% using LE self-care skills and strategies learned during Intensive Phase OT independently to control limb swelling and associated pain, to  limit LE progression, to decrease infection risk and to limit further functional decline.    Baseline  Max A    Time  6    Period  Months    Status  New    Target Date  11/24/17              Patient will benefit from skilled therapeutic intervention in order to improve the following deficits and impairments:  Decreased knowledge of use of DME, Decreased skin integrity, Increased edema, Impaired flexibility, Pain, Decreased mobility, Decreased activity tolerance, Decreased range of motion, Decreased knowledge of precautions, Difficulty walking, Obesity  Visit Diagnosis: Lymphedema, not  elsewhere classified    Problem List Patient Active Problem List   Diagnosis Date Noted  . Elevated parathyroid hormone 06/13/2017  . Vitamin D deficiency 06/13/2017  . Hx of colonic polyps 05/15/2017  . Eustachian tube dysfunction, right 05/15/2017  . Lymphatic edema 05/15/2017  . Hypertension 05/14/2017  . Anemia 05/11/2017  . Arrhythmia 05/11/2017  . Diverticulosis 05/11/2017  . GERD (gastroesophageal reflux disease) 05/11/2017  . Hypothyroidism 05/11/2017  . Lower extremity edema 05/11/2017  . Morbid obesity ([place]) 05/11/2017  . Polycystic ovary syndrome 05/11/2017  . OSA on CPAP 05/11/2017  . Neck pain 05/11/2017  . Allergic rhinitis 05/11/2017  . 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 [place], [place], [address] Phone: [phone]   Fax:  [phone]  Name: Madison Palmer MRN: [id number] Date of Birth: May 14, 1965

## 2017-07-24 ENCOUNTER — Ambulatory Visit: Payer: BLUE CROSS/BLUE SHIELD | Admitting: Occupational Therapy

## 2017-07-25 ENCOUNTER — Encounter: Payer: Self-pay | Admitting: Endocrinology

## 2017-07-25 ENCOUNTER — Ambulatory Visit: Payer: BLUE CROSS/BLUE SHIELD | Admitting: Endocrinology

## 2017-07-25 DIAGNOSIS — E042 Nontoxic multinodular goiter: Secondary | ICD-10-CM | POA: Diagnosis not present

## 2017-07-25 NOTE — Progress Notes (Signed)
Subjective:    Patient ID: Madison Palmer, female    DOB: 1966/02/18, 52 y.o.   MRN: [id number]  HPI Pt is referred by Wilfred Lacy, NP, for hypothyroidism.  Pt reports hypothyroidism was dx'ed in 1992.  she has been on prescribed thyroid hormone therapy since then.  she has never taken kelp or any other type of non-prescribed thyroid product.  She has never had thyroid surgery, or XRT to the neck.  she has never been on amiodarone or lithium.  She has had surveillance ultrasound for multinodular goiter.  She had a thyroid bx in 2014, and again last week.  She has been on 75 mcg/day, M-F, since 2018.   She also has hyperparathyroidism.  It has been uncertain if it is primary, secondary, or both.   Past Medical History:  Diagnosis Date  . Arrhythmia    heart  . Arthritis   . Cardiomyopathy ([place])   . Chickenpox   . Depression   . Diverticulitis   . GERD (gastroesophageal reflux disease)   . Glaucoma   . Headache   . History of PCOS   . Hyperlipidemia   . Hypertension   . Inflammatory polyps of colon ([place])   . Recurrent UTI   . Thyroid disease   . UTI (urinary tract infection)     Past Surgical History:  Procedure Laterality Date  . BREAST BIOPSY  2015  . CESAREAN SECTION  2004  . INNER EAR SURGERY     ear and sinus surgery  . LAPAROSCOPIC REPAIR AND REMOVAL OF GASTRIC BAND    . OOPHORECTOMY Left   . TONSILLECTOMY AND ADENOIDECTOMY      Social History   Socioeconomic History  . Marital status: Married    Spouse name: Not on file  . Number of children: Not on file  . Years of education: Not on file  . Highest education level: Not on file  Occupational History  . Not on file  Social Needs  . Financial resource strain: Not on file  . Food insecurity:    Worry: Not on file    Inability: Not on file  . Transportation needs:    Medical: Not on file    Non-medical: Not on file  Tobacco Use  . Smoking status: Never Smoker  . Smokeless tobacco: Never Used    Substance and Sexual Activity  . Alcohol use: Yes    Comment: social  . Drug use: No  . Sexual activity: Not on file  Lifestyle  . Physical activity:    Days per week: Not on file    Minutes per session: Not on file  . Stress: Not on file  Relationships  . Social connections:    Talks on phone: Not on file    Gets together: Not on file    Attends religious service: Not on file    Active member of club or organization: Not on file    Attends meetings of clubs or organizations: Not on file    Relationship status: Not on file  . Intimate partner violence:    Fear of current or ex partner: Not on file    Emotionally abused: Not on file    Physically abused: Not on file    Forced sexual activity: Not on file  Other Topics Concern  . Not on file  Social History Narrative  . Not on file    Current Outpatient Medications on File Prior to Visit  Medication Sig Dispense Refill  .  Acetaminophen (TYLENOL EXTRA STRENGTH PO) Take by mouth. Takes 2 tablets 2-3 times a day.    Marland Kitchen atenolol (TENORMIN) 25 MG tablet TK 1 T PO D  0  . ergocalciferol (VITAMIN D2) 50000 units capsule Take 50,000 Units by mouth once a week.    . esomeprazole (NEXIUM) 40 MG capsule TK ONE C PO  D  1  . fluticasone (FLONASE) 50 MCG/ACT nasal spray Place 2 sprays into both nostrils daily.    . hydrALAZINE (APRESOLINE) 50 MG tablet Take 1 tablet (50 mg total) by mouth 3 (three) times daily. 90 tablet 2  . hydrochlorothiazide (HYDRODIURIL) 50 MG tablet Take 50 mg by mouth daily.  3  . ibuprofen (ADVIL,MOTRIN) 600 MG tablet Take 600 mg by mouth every 6 (six) hours as needed.    Marland Kitchen levothyroxine (SYNTHROID, LEVOTHROID) 75 MCG tablet TK 1 T PO QD  MONDAY-FRIDAY  0  . ranitidine (ZANTAC) 150 MG tablet Take by mouth.     No current facility-administered medications on file prior to visit.     Allergies  Allergen Reactions  . Fentanyl Hives  . Levofloxacin Hives  . Midazolam Hives  . Pollen Extract     seasonal  .  Atorvastatin     Muscle pain in legs   . Rosuvastatin     Abdominal pain    Family History  Adopted: Yes    BP (!) 162/110 (BP Location: Right Wrist, Patient Position: Sitting, Cuff Size: Normal)   Pulse 74   Wt (!) 338 lb (153.3 kg)   SpO2 94%   BMI 59.87 kg/m     Review of Systems denies depression, muscle cramps, sob, memory loss, constipation, numbness, blurry vision, cold intolerance, myalgias, dry skin, rhinorrhea, and syncope.  She has easy bruising and weight gain.       Objective:   Physical Exam VS: see vs page GEN: no distress.  Morbid obesity HEAD: head: no deformity eyes: no periorbital swelling, no proptosis external nose and ears are normal mouth: no lesion seen NECK: There is no palpable thyroid enlargement.  No thyroid nodule is palpable.  No palpable lymphadenopathy at the anterior neck. CHEST WALL: no deformity LUNGS: clear to auscultation CV: reg rate and rhythm, no murmur ABD: abdomen is soft, nontender.  no hepatosplenomegaly.  not distended.  no hernia MUSCULOSKELETAL: muscle bulk and strength are grossly normal.  no obvious joint swelling.  gait is steady, with a cane.   EXTEMITIES: no deformity.  trace bilat leg edema.  PULSES: no carotid bruit NEURO:  cn 2-12 grossly intact.   readily moves all 4's.  sensation is intact to touch on all 4's.  SKIN:  Normal texture and temperature.  No rash or suspicious lesion is visible.   NODES:  None palpable at the neck PSYCH: alert, well-oriented.  Does not appear anxious nor depressed.   Pt signs release of information from [place]  Lab Results  Component Value Date   TSH 1.28 05/29/2017       Assessment & Plan:  Multinodular goiter, apparently benign Hypothyroidism: well-replaced Hyperparathyroidism, uncertain etiology.    Patient Instructions  i'll review the results when they arrive here.  Please come back for a follow-up appointment in 6 months.

## 2017-07-25 NOTE — Patient Instructions (Addendum)
i'll review the results when they arrive here.  Please come back for a follow-up appointment in 6 months.

## 2017-07-26 ENCOUNTER — Ambulatory Visit: Payer: BLUE CROSS/BLUE SHIELD | Admitting: Occupational Therapy

## 2017-07-26 DIAGNOSIS — I890 Lymphedema, not elsewhere classified: Secondary | ICD-10-CM

## 2017-07-26 NOTE — Therapy (Signed)
[place] MAIN [facility] SERVICES [address] [place], [place], [address] Phone: [phone]   Fax:  [phone]  Occupational Therapy Treatment  Patient Details  Name: Madison Palmer MRN: [id number] Date of Birth: 09-21-1965 Referring Provider: Debbora Dus, MD   Encounter Date: 07/26/2017    Past Medical History:  Diagnosis Date  . Arrhythmia    heart  . Arthritis   . Cardiomyopathy ([place])   . Chickenpox   . Depression   . Diverticulitis   . GERD (gastroesophageal reflux disease)   . Glaucoma   . Headache   . History of PCOS   . Hyperlipidemia   . Hypertension   . Inflammatory polyps of colon ([place])   . Recurrent UTI   . Thyroid disease   . UTI (urinary tract infection)     Past Surgical History:  Procedure Laterality Date  . BREAST BIOPSY  2015  . CESAREAN SECTION  2004  . INNER EAR SURGERY     ear and sinus surgery  . LAPAROSCOPIC REPAIR AND REMOVAL OF GASTRIC BAND    . OOPHORECTOMY Left   . TONSILLECTOMY AND ADENOIDECTOMY      There were no vitals filed for this visit.       LYMPHEDEMA/ONCOLOGY QUESTIONNAIRE - 07/26/17 1631      Right Lower Extremity Lymphedema   Other  RLE ankle to below knee (A-D) limb volume =5426.49 ml.  Limb volume is decreased by 4.78% below R knee  since initial volumetrics on 05/28/17.       Left Lower Extremity Lymphedema   Other  LLE ankle to below knee (A-D) limb volume = 6037.63 ml. This value meets 10% limb volume reduction goal since we are treating ankle to popliteal only.     Other  Limb volume differential (LVD) measures 11.26%, L>R, which is decreased from initial LVD of 17.87% below knee.               OT Treatments/Exercises (OP) - 07/26/17 0001      ADLs   ADL Education Given  Yes      Manual Therapy   Manual Therapy  Edema management;Manual Lymphatic Drainage (MLD)    Manual therapy comments  skin care during LLE MLD    Edema Management  BLE comparative  limb volumetrics    Manual Lymphatic Drainage (MLD)  itive     Compression Bandaging  Pt donned compression stockings after treatment independently w extra time                  OT Long Term Goals - 05/22/17 0951      OT LONG TERM GOAL #1   Title  Pt modified independent w/ lymphedema precautions and prevention principals and strategies using printed reference to limit LE progression and infection risk.    Baseline  Max A    Time  2    Period  Weeks    Status  New    Target Date  -- 10th OT visit      OT LONG TERM GOAL #2   Title  Lymphedema (LE) management/ self-care: Pt able to apply knee length, multi layered, gradient compression wraps with minimal assistance using proper techniques within 2 weeks to achieve optimal limb volume reductions in preparation for fitting compression garments/ devices bilaterally.    Baseline  Max A    Time  2    Period  Weeks    Status  New    Target  Date  -- 10th OT visit      OT LONG TERM GOAL #3   Title  Lymphedema (LE) management/ self-care:  Pt to achieve at least 10%  BLE limb volume reductions  during Intensive Phase CDT to limit LE progression, to reduce pain, to improve ADLs performance.    Baseline  Max A    Time  12    Period  Weeks    Status  New    Target Date  08/20/17      OT LONG TERM GOAL #4   Title  Lymphedema (LE) management/ self-care:  Pt to tolerate daily compression wraps, compression garments and/ or HOS devices in keeping w/ prescribed wear regime within 1 week of issue date of each to progress and retain clinical and functional gains and to limit LE progression.    Baseline  Max A    Time  12    Period  Weeks    Status  New    Target Date  08/20/17      OT LONG TERM GOAL #5   Title  Lymphedema (LE) self-management/ self-care:  During Management Phase CDT Pt to sustain current limb volumes within 5% using LE self-care skills and strategies learned during Intensive Phase OT independently to control limb  swelling and associated pain, to  limit LE progression, to decrease infection risk and to limit further functional decline.    Baseline  Max A    Time  6    Period  Months    Status  New    Target Date  11/24/17            Plan - 07/26/17 1636    Clinical Impression Statement  Completed BLE compative limb volumetrics below knee only to measure progress towards volume reduction goal to date. RLE ankle to below knee (A-D) limb volume =5426.49 ml.  Limb volume is decreased by 4.78% below R knee  since initial volumetrics on 05/28/17.   LLE ankle to below knee (A-D) limb volume = 6037.63 ml. This value meets 10% limb volume reduction goal since we are treating ankle to popliteal only.   Limb volume differential (LVD) measures 11.26%, L>R, which is decreased from initial LVD of 17.87% below knee.  Pt donned compression garments at end of session with Max assist 2/2 time constraints. Pt agrees with plan to cont OT 3 x weekly untiol custom compression garment  fitting is complete. If remakes are needed we'll decrease frequency to 1-2 x weekly while awaiting fitting. If initial capriol length pany hose fit well intially, we'll decrease to f/u in 1 week , then 1 month and assist Pt to transition to self Management Phase CDT.    Occupational performance deficits (Please refer to evaluation for details):  ADL's;IADL's;Work;Leisure;Social Participation;Other body image    Rehab Potential  Good    OT Frequency  3x / week    OT Duration  12 weeks    OT Treatment/Interventions  Self-care/ADL training;DME and/or AE instruction;Manual Therapy;Patient/family education;Manual lymph drainage;Therapeutic exercise;Compression bandaging;Therapeutic activities;Other (comment) skin care w/ low ph castor oil and/ or lotion during MLD    Recommended Other Services  fit with appropriate BLE compression garments, HOSD devices that are comfortable and Pt is able to don and doff using assistive devices PRN    Consulted and  Agree with Plan of Care  Patient       Patient will benefit from skilled therapeutic intervention in order to improve the following deficits and impairments:  Decreased knowledge of use of DME, Decreased skin integrity, Increased edema, Impaired flexibility, Pain, Decreased mobility, Decreased activity tolerance, Decreased range of motion, Decreased knowledge of precautions, Difficulty walking, Obesity  Visit Diagnosis: Lymphedema, not elsewhere classified    Problem List Patient Active Problem List   Diagnosis Date Noted  . Elevated parathyroid hormone 06/13/2017  . Vitamin D deficiency 06/13/2017  . Hx of colonic polyps 05/15/2017  . Eustachian tube dysfunction, right 05/15/2017  . Lymphatic edema 05/15/2017  . Hypertension 05/14/2017  . Anemia 05/11/2017  . Arrhythmia 05/11/2017  . Diverticulosis 05/11/2017  . GERD (gastroesophageal reflux disease) 05/11/2017  . Hypothyroidism 05/11/2017  . Lower extremity edema 05/11/2017  . Morbid obesity ([place]) 05/11/2017  . Polycystic ovary syndrome 05/11/2017  . OSA on CPAP 05/11/2017  . Neck pain 05/11/2017  . Allergic rhinitis 05/11/2017  . 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 [place], [place], [address] Phone: [phone]   Fax:  [phone]  Name: Madison Palmer MRN: [id number] Date of Birth: 1966/01/05

## 2017-07-27 DIAGNOSIS — E042 Nontoxic multinodular goiter: Secondary | ICD-10-CM | POA: Insufficient documentation

## 2017-07-30 ENCOUNTER — Ambulatory Visit: Payer: BLUE CROSS/BLUE SHIELD | Attending: Nurse Practitioner | Admitting: Occupational Therapy

## 2017-07-30 DIAGNOSIS — I890 Lymphedema, not elsewhere classified: Secondary | ICD-10-CM | POA: Diagnosis present

## 2017-07-30 NOTE — Therapy (Signed)
[place] MAIN [facility] SERVICES [address] [place], [place], [address] Phone: [phone]   Fax:  [phone]  Occupational Therapy Treatment  Patient Details  Name: Madison Palmer MRN: [id number] Date of Birth: 1965-10-02 Referring Provider: Debbora Dus, MD   Encounter Date: 07/30/2017  OT End of Session - 07/30/17 1621    Visit Number  23    Number of Visits  36    Date for OT Re-Evaluation  08/20/17    OT Start Time  0103    OT Stop Time  0203    OT Time Calculation (min)  60 min    Activity Tolerance  Patient tolerated treatment well;No increased pain BLE AROM at all joints limited by body habitus. Pt unable to reach feet without excessive physical effort    Behavior During Therapy  [facility] for tasks assessed/performed       Past Medical History:  Diagnosis Date  . Arrhythmia    heart  . Arthritis   . Cardiomyopathy ([place])   . Chickenpox   . Depression   . Diverticulitis   . GERD (gastroesophageal reflux disease)   . Glaucoma   . Headache   . History of PCOS   . Hyperlipidemia   . Hypertension   . Inflammatory polyps of colon ([place])   . Recurrent UTI   . Thyroid disease   . UTI (urinary tract infection)     Past Surgical History:  Procedure Laterality Date  . BREAST BIOPSY  2015  . CESAREAN SECTION  2004  . INNER EAR SURGERY     ear and sinus surgery  . LAPAROSCOPIC REPAIR AND REMOVAL OF GASTRIC BAND    . OOPHORECTOMY Left   . TONSILLECTOMY AND ADENOIDECTOMY      There were no vitals filed for this visit.  Subjective Assessment - 07/30/17 1613    Subjective   Pt presents for OT visit 24/ 36 to for Intensive Phase CDT to  BLE. Pt complains of increased L knee pain since last thurs. Pt reports insideous onset. Pt states, "It hurt so bad I was about to cry." OT encouraged Pt to make appointment with orthopedist as she is concerned today. "Something's not right."    Pertinent History  onset BLE LE 2004 s/p C  section, essential HTN, GERD, Hypothyroidism, Obesity; OSA ( compliant w/ C-Pap) Lap Band sx s/p 2009 Lap banc removed 2013, OA    Limitations  limited hip AROM- unable to reach feet and distal legs to don socks, compression garments, lace up street shoews, decreased standing and walking tolerance 2/2 leg swelling and associated pain,     Special Tests  Initial Lymphedema Life Impact Scale [facility]) score measures extent of impairment 2/2 lymphedema " in the pst week" at 49%.     Patient Stated Goals  decrease leg edema and get it under better control; explore alternative compression garments/ devices    Currently in Pain?  Yes not rated numerically    Pain Location  Knee    Pain Orientation  Left    Pain Descriptors / Indicators  Aching    Pain Type  Acute pain    Pain Onset  In the past 7 days chronic    Pain Frequency  Intermittent    Aggravating Factors   weight bearing, MLD                   OT Treatments/Exercises (OP) - 07/30/17 0001  ADLs   ADL Education Given  Yes      Manual Therapy   Manual Therapy  Manual Lymphatic Drainage (MLD);Edema management    Manual therapy comments  skin care during LLE MLD    Manual Lymphatic Drainage (MLD)  MLD to RLE for half of session w/ emphasis on lateral knee at "bottleneck" area and proximally Pain increased even with very light MLD, so DC RLE manual therapy this session and resumed LLE manual therapy as established    Compression Bandaging  Pt donned compression stockings after treatment independently w extra time             OT Education - 07/30/17 1620    Education provided  No    Education Details  Continued skilled Pt/caregiver education  And LE ADL training throughout visit for lymphedema self care/ home program, including compression wrapping, compression garment and device wear/care, lymphatic pumping ther ex, simple self-MLD, and skin care. Discussed progress towards goals.     Person(s) Educated  Patient     Methods  Explanation;Demonstration    Comprehension  Verbalized understanding;Returned demonstration          OT Long Term Goals - 05/22/17 0951      OT LONG TERM GOAL #1   Title  Pt modified independent w/ lymphedema precautions and prevention principals and strategies using printed reference to limit LE progression and infection risk.    Baseline  Max A    Time  2    Period  Weeks    Status  New    Target Date  -- 10th OT visit      OT LONG TERM GOAL #2   Title  Lymphedema (LE) management/ self-care: Pt able to apply knee length, multi layered, gradient compression wraps with minimal assistance using proper techniques within 2 weeks to achieve optimal limb volume reductions in preparation for fitting compression garments/ devices bilaterally.    Baseline  Max A    Time  2    Period  Weeks    Status  New    Target Date  -- 10th OT visit      OT LONG TERM GOAL #3   Title  Lymphedema (LE) management/ self-care:  Pt to achieve at least 10%  BLE limb volume reductions  during Intensive Phase CDT to limit LE progression, to reduce pain, to improve ADLs performance.    Baseline  Max A    Time  12    Period  Weeks    Status  New    Target Date  08/20/17      OT LONG TERM GOAL #4   Title  Lymphedema (LE) management/ self-care:  Pt to tolerate daily compression wraps, compression garments and/ or HOS devices in keeping w/ prescribed wear regime within 1 week of issue date of each to progress and retain clinical and functional gains and to limit LE progression.    Baseline  Max A    Time  12    Period  Weeks    Status  New    Target Date  08/20/17      OT LONG TERM GOAL #5   Title  Lymphedema (LE) self-management/ self-care:  During Management Phase CDT Pt to sustain current limb volumes within 5% using LE self-care skills and strategies learned during Intensive Phase OT independently to control limb swelling and associated pain, to  limit LE progression, to decrease infection risk  and to limit further functional decline.  Baseline  Max A    Time  6    Period  Months    Status  New    Target Date  11/24/17            Plan - 07/30/17 1621    Clinical Impression Statement  Pt c/o increased L medial knee pain since last visit. Increased swelling and tissue density is observed at medial proximal leg  today, Pt tolerated limited light MLD only to R leg. Resumed RLE MLD. Pt donned compression garments at end of session. Encouraged her rto make appointment to see ortho for assessment of possible knee injury.    Occupational performance deficits (Please refer to evaluation for details):  ADL's;IADL's;Work;Leisure;Social Participation;Other body image    Rehab Potential  Good    OT Frequency  3x / week    OT Duration  12 weeks    OT Treatment/Interventions  Self-care/ADL training;DME and/or AE instruction;Manual Therapy;Patient/family education;Manual lymph drainage;Therapeutic exercise;Compression bandaging;Therapeutic activities;Other (comment) skin care w/ low ph castor oil and/ or lotion during MLD    Recommended Other Services  fit with appropriate BLE compression garments, HOSD devices that are comfortable and Pt is able to don and doff using assistive devices PRN    Consulted and Agree with Plan of Care  Patient       Patient will benefit from skilled therapeutic intervention in order to improve the following deficits and impairments:  Decreased knowledge of use of DME, Decreased skin integrity, Increased edema, Impaired flexibility, Pain, Decreased mobility, Decreased activity tolerance, Decreased range of motion, Decreased knowledge of precautions, Difficulty walking, Obesity  Visit Diagnosis: Lymphedema, not elsewhere classified    Problem List Patient Active Problem List   Diagnosis Date Noted  . Multinodular goiter 07/27/2017  . Elevated parathyroid hormone 06/13/2017  . Vitamin D deficiency 06/13/2017  . Hx of colonic polyps 05/15/2017  .  Eustachian tube dysfunction, right 05/15/2017  . Lymphatic edema 05/15/2017  . Hypertension 05/14/2017  . Anemia 05/11/2017  . Arrhythmia 05/11/2017  . Diverticulosis 05/11/2017  . GERD (gastroesophageal reflux disease) 05/11/2017  . Hypothyroidism 05/11/2017  . Lower extremity edema 05/11/2017  . Morbid obesity ([place]) 05/11/2017  . Polycystic ovary syndrome 05/11/2017  . OSA on CPAP 05/11/2017  . Neck pain 05/11/2017  . Allergic rhinitis 05/11/2017  . 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

## 2017-07-31 ENCOUNTER — Ambulatory Visit: Payer: BLUE CROSS/BLUE SHIELD | Admitting: Occupational Therapy

## 2017-07-31 DIAGNOSIS — I890 Lymphedema, not elsewhere classified: Secondary | ICD-10-CM | POA: Diagnosis not present

## 2017-07-31 NOTE — Therapy (Signed)
[place] MAIN [facility] SERVICES [address] [place], [place], [address] Phone: [phone]   Fax:  [phone]  Occupational Therapy Treatment  Patient Details  Name: Madison Palmer MRN: [id number] Date of Birth: 06-Jul-1965 Referring Provider: Debbora Dus, MD   Encounter Date: 07/31/2017  OT End of Session - 07/31/17 1726    Visit Number  24    Number of Visits  36    Date for OT Re-Evaluation  08/20/17    OT Start Time  0215    OT Stop Time  0315    OT Time Calculation (min)  60 min    Activity Tolerance  Patient tolerated treatment well;No increased pain BLE AROM at all joints limited by body habitus. Pt unable to reach feet without excessive physical effort    Behavior During Therapy  [facility] for tasks assessed/performed       Past Medical History:  Diagnosis Date  . Arrhythmia    heart  . Arthritis   . Cardiomyopathy ([place])   . Chickenpox   . Depression   . Diverticulitis   . GERD (gastroesophageal reflux disease)   . Glaucoma   . Headache   . History of PCOS   . Hyperlipidemia   . Hypertension   . Inflammatory polyps of colon ([place])   . Recurrent UTI   . Thyroid disease   . UTI (urinary tract infection)     Past Surgical History:  Procedure Laterality Date  . BREAST BIOPSY  2015  . CESAREAN SECTION  2004  . INNER EAR SURGERY     ear and sinus surgery  . LAPAROSCOPIC REPAIR AND REMOVAL OF GASTRIC BAND    . OOPHORECTOMY Left   . TONSILLECTOMY AND ADENOIDECTOMY      There were no vitals filed for this visit.  Subjective Assessment - 07/31/17 1723    Subjective   Pt presents for OT visit 25/ 36 to for Intensive Phase CDT to  BLE. Pt 20 minutes late for 60 minute appt. Pt reports pain in  R knee is much reduced today.     Pertinent History  onset BLE LE 2004 s/p C section, essential HTN, GERD, Hypothyroidism, Obesity; OSA ( compliant w/ C-Pap) Lap Band sx s/p 2009 Lap banc removed 2013, OA    Limitations  limited  hip AROM- unable to reach feet and distal legs to don socks, compression garments, lace up street shoews, decreased standing and walking tolerance 2/2 leg swelling and associated pain,     Special Tests  Initial Lymphedema Life Impact Scale [facility]) score measures extent of impairment 2/2 lymphedema " in the pst week" at 49%.     Patient Stated Goals  decrease leg edema and get it under better control; explore alternative compression garments/ devices    Currently in Pain?  No/denies    Pain Onset  -- chronic                   OT Treatments/Exercises (OP) - 07/31/17 0001      ADLs   ADL Education Given  Yes      Manual Therapy   Manual Therapy  Manual Lymphatic Drainage (MLD);Edema management    Manual therapy comments  skin care during LLE MLD    Manual Lymphatic Drainage (MLD)  MLD to LLE as established    Compression Bandaging  Pt donned compression stockings after treatment independently w extra time  OT Education - 07/31/17 1726    Education provided  Yes    Education Details  Continued skilled Pt/caregiver education  And LE ADL training throughout visit for lymphedema self care/ home program, including compression wrapping, compression garment and device wear/care, lymphatic pumping ther ex, simple self-MLD, and skin care. Discussed progress towards goals.     Person(s) Educated  Patient    Methods  Explanation;Demonstration    Comprehension  Verbalized understanding;Returned demonstration          OT Long Term Goals - 05/22/17 0951      OT LONG TERM GOAL #1   Title  Pt modified independent w/ lymphedema precautions and prevention principals and strategies using printed reference to limit LE progression and infection risk.    Baseline  Max A    Time  2    Period  Weeks    Status  New    Target Date  -- 10th OT visit      OT LONG TERM GOAL #2   Title  Lymphedema (LE) management/ self-care: Pt able to apply knee length, multi layered,  gradient compression wraps with minimal assistance using proper techniques within 2 weeks to achieve optimal limb volume reductions in preparation for fitting compression garments/ devices bilaterally.    Baseline  Max A    Time  2    Period  Weeks    Status  New    Target Date  -- 10th OT visit      OT LONG TERM GOAL #3   Title  Lymphedema (LE) management/ self-care:  Pt to achieve at least 10%  BLE limb volume reductions  during Intensive Phase CDT to limit LE progression, to reduce pain, to improve ADLs performance.    Baseline  Max A    Time  12    Period  Weeks    Status  New    Target Date  08/20/17      OT LONG TERM GOAL #4   Title  Lymphedema (LE) management/ self-care:  Pt to tolerate daily compression wraps, compression garments and/ or HOS devices in keeping w/ prescribed wear regime within 1 week of issue date of each to progress and retain clinical and functional gains and to limit LE progression.    Baseline  Max A    Time  12    Period  Weeks    Status  New    Target Date  08/20/17      OT LONG TERM GOAL #5   Title  Lymphedema (LE) self-management/ self-care:  During Management Phase CDT Pt to sustain current limb volumes within 5% using LE self-care skills and strategies learned during Intensive Phase OT independently to control limb swelling and associated pain, to  limit LE progression, to decrease infection risk and to limit further functional decline.    Baseline  Max A    Time  6    Period  Months    Status  New    Target Date  11/24/17            Plan - 07/31/17 1727    Clinical Impression Statement  Pt tolerated all aspects of CDT today, including MLD and skin care. Pt managing well between visits. We are awaiting customo compression fitting. Garments have been ordered but have not been sent from manufacturer yet. Cont as per POC.    Occupational performance deficits (Please refer to evaluation for details):  ADL's;IADL's;Work;Leisure;Social  Participation;Other body image    Rehab  Potential  Good    OT Frequency  3x / week    OT Duration  12 weeks    OT Treatment/Interventions  Self-care/ADL training;DME and/or AE instruction;Manual Therapy;Patient/family education;Manual lymph drainage;Therapeutic exercise;Compression bandaging;Therapeutic activities;Other (comment) skin care w/ low ph castor oil and/ or lotion during MLD    Recommended Other Services  fit with appropriate BLE compression garments, HOSD devices that are comfortable and Pt is able to don and doff using assistive devices PRN    Consulted and Agree with Plan of Care  Patient       Patient will benefit from skilled therapeutic intervention in order to improve the following deficits and impairments:  Decreased knowledge of use of DME, Decreased skin integrity, Increased edema, Impaired flexibility, Pain, Decreased mobility, Decreased activity tolerance, Decreased range of motion, Decreased knowledge of precautions, Difficulty walking, Obesity  Visit Diagnosis: Lymphedema, not elsewhere classified    Problem List Patient Active Problem List   Diagnosis Date Noted  . Multinodular goiter 07/27/2017  . Elevated parathyroid hormone 06/13/2017  . Vitamin D deficiency 06/13/2017  . Hx of colonic polyps 05/15/2017  . Eustachian tube dysfunction, right 05/15/2017  . Lymphatic edema 05/15/2017  . Hypertension 05/14/2017  . Anemia 05/11/2017  . Arrhythmia 05/11/2017  . Diverticulosis 05/11/2017  . GERD (gastroesophageal reflux disease) 05/11/2017  . Hypothyroidism 05/11/2017  . Lower extremity edema 05/11/2017  . Morbid obesity ([place]) 05/11/2017  . Polycystic ovary syndrome 05/11/2017  . OSA on CPAP 05/11/2017  . Neck pain 05/11/2017  . Allergic rhinitis 05/11/2017  . 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 [place], [place], [address] Phone: [phone]   Fax:  [phone]  Name: Madison Palmer MRN: [id number] Date of Birth: 1966-02-22

## 2017-08-02 ENCOUNTER — Ambulatory Visit: Payer: BLUE CROSS/BLUE SHIELD | Admitting: Occupational Therapy

## 2017-08-02 DIAGNOSIS — I890 Lymphedema, not elsewhere classified: Secondary | ICD-10-CM | POA: Diagnosis not present

## 2017-08-02 NOTE — Therapy (Signed)
[place] MAIN [facility] SERVICES [address] [place], [place], [address] Phone: [phone]   Fax:  [phone]  Occupational Therapy Treatment  Patient Details  Name: Madison Palmer MRN: [id number] Date of Birth: April 27, 1966 Referring Provider: Debbora Dus, MD   Encounter Date: 08/02/2017  OT End of Session - 08/02/17 1653    Visit Number  25    Number of Visits  36    Date for OT Re-Evaluation  08/20/17    OT Start Time  0105    OT Stop Time  0205    OT Time Calculation (min)  60 min    Activity Tolerance  Patient tolerated treatment well;No increased pain BLE AROM at all joints limited by body habitus. Pt unable to reach feet without excessive physical effort    Behavior During Therapy  [facility] for tasks assessed/performed       Past Medical History:  Diagnosis Date  . Arrhythmia    heart  . Arthritis   . Cardiomyopathy ([place])   . Chickenpox   . Depression   . Diverticulitis   . GERD (gastroesophageal reflux disease)   . Glaucoma   . Headache   . History of PCOS   . Hyperlipidemia   . Hypertension   . Inflammatory polyps of colon ([place])   . Recurrent UTI   . Thyroid disease   . UTI (urinary tract infection)     Past Surgical History:  Procedure Laterality Date  . BREAST BIOPSY  2015  . CESAREAN SECTION  2004  . INNER EAR SURGERY     ear and sinus surgery  . LAPAROSCOPIC REPAIR AND REMOVAL OF GASTRIC BAND    . OOPHORECTOMY Left   . TONSILLECTOMY AND ADENOIDECTOMY      There were no vitals filed for this visit.  Subjective Assessment - 08/02/17 1649    Subjective   Pt presents for OT visit 26/ 36 to for Intensive Phase CDT to  BLE. Pt telephoned garment vendor to check status of her custom compression garment. Vendor reports thant garment was shipped to Pt's old home address in [place] on 3/28. Vendor will send expedited fed ex mailer to spouse at old address, and Pt will request a friend help her to send the garment  so she can receive it in a timely way.    Pertinent History  onset BLE LE 2004 s/p C section, essential HTN, GERD, Hypothyroidism, Obesity; OSA ( compliant w/ C-Pap) Lap Band sx s/p 2009 Lap banc removed 2013, OA    Limitations  limited hip AROM- unable to reach feet and distal legs to don socks, compression garments, lace up street shoews, decreased standing and walking tolerance 2/2 leg swelling and associated pain,     Special Tests  Initial Lymphedema Life Impact Scale [facility]) score measures extent of impairment 2/2 lymphedema " in the pst week" at 49%.     Patient Stated Goals  decrease leg edema and get it under better control; explore alternative compression garments/ devices    Currently in Pain?  Yes B OA knee pain. not rated numerically    Pain Location  Knee    Pain Orientation  Right;Left    Pain Descriptors / Indicators  Aching    Pain Type  Chronic pain    Pain Onset  -- chronic                   OT Treatments/Exercises (OP) - 08/02/17 0001  ADLs   ADL Education Given  Yes      Manual Therapy   Manual Therapy  Manual Lymphatic Drainage (MLD);Edema management    Manual therapy comments  skin care during LLE MLD    Manual Lymphatic Drainage (MLD)  MLD to BLE today as established. Emphasis on Proximal R thigh    Compression Bandaging  Pt donned compression stockings after treatment independently w extra time             OT Education - 08/02/17 1652    Education provided  Yes    Education Details  Continued skilled Pt/caregiver education  And LE ADL training throughout visit for lymphedema self care/ home program, including compression wrapping, compression garment and device wear/care, lymphatic pumping ther ex, simple self-MLD, and skin care. Discussed progress towards goals.     Person(s) Educated  Patient    Methods  Explanation;Demonstration    Comprehension  Returned demonstration;Verbalized understanding          OT Long Term Goals -  05/22/17 0951      OT LONG TERM GOAL #1   Title  Pt modified independent w/ lymphedema precautions and prevention principals and strategies using printed reference to limit LE progression and infection risk.    Baseline  Max A    Time  2    Period  Weeks    Status  New    Target Date  -- 10th OT visit      OT LONG TERM GOAL #2   Title  Lymphedema (LE) management/ self-care: Pt able to apply knee length, multi layered, gradient compression wraps with minimal assistance using proper techniques within 2 weeks to achieve optimal limb volume reductions in preparation for fitting compression garments/ devices bilaterally.    Baseline  Max A    Time  2    Period  Weeks    Status  New    Target Date  -- 10th OT visit      OT LONG TERM GOAL #3   Title  Lymphedema (LE) management/ self-care:  Pt to achieve at least 10%  BLE limb volume reductions  during Intensive Phase CDT to limit LE progression, to reduce pain, to improve ADLs performance.    Baseline  Max A    Time  12    Period  Weeks    Status  New    Target Date  08/20/17      OT LONG TERM GOAL #4   Title  Lymphedema (LE) management/ self-care:  Pt to tolerate daily compression wraps, compression garments and/ or HOS devices in keeping w/ prescribed wear regime within 1 week of issue date of each to progress and retain clinical and functional gains and to limit LE progression.    Baseline  Max A    Time  12    Period  Weeks    Status  New    Target Date  08/20/17      OT LONG TERM GOAL #5   Title  Lymphedema (LE) self-management/ self-care:  During Management Phase CDT Pt to sustain current limb volumes within 5% using LE self-care skills and strategies learned during Intensive Phase OT independently to control limb swelling and associated pain, to  limit LE progression, to decrease infection risk and to limit further functional decline.    Baseline  Max A    Time  6    Period  Months    Status  New    Target Date  11/24/17             Plan - 08/02/17 1653    Clinical Impression Statement  Pt's garment fitting is delayed due to vendor shipping error. Pt tolerated BLE MLD today without difficulty. Provided skin care throughout MLD. Pt able to don compression knee highs independently after session. Will fit capri panty hose ASAP and modify PRN. Cont as per OPC.    Occupational performance deficits (Please refer to evaluation for details):  ADL's;IADL's;Work;Leisure;Social Participation;Other body image    Rehab Potential  Good    OT Frequency  3x / week    OT Duration  12 weeks    OT Treatment/Interventions  Self-care/ADL training;DME and/or AE instruction;Manual Therapy;Patient/family education;Manual lymph drainage;Therapeutic exercise;Compression bandaging;Therapeutic activities;Other (comment) skin care w/ low ph castor oil and/ or lotion during MLD    Recommended Other Services  fit with appropriate BLE compression garments, HOSD devices that are comfortable and Pt is able to don and doff using assistive devices PRN    Consulted and Agree with Plan of Care  Patient       Patient will benefit from skilled therapeutic intervention in order to improve the following deficits and impairments:  Decreased knowledge of use of DME, Decreased skin integrity, Increased edema, Impaired flexibility, Pain, Decreased mobility, Decreased activity tolerance, Decreased range of motion, Decreased knowledge of precautions, Difficulty walking, Obesity  Visit Diagnosis: Lymphedema, not elsewhere classified    Problem List Patient Active Problem List   Diagnosis Date Noted  . Multinodular goiter 07/27/2017  . Elevated parathyroid hormone 06/13/2017  . Vitamin D deficiency 06/13/2017  . Hx of colonic polyps 05/15/2017  . Eustachian tube dysfunction, right 05/15/2017  . Lymphatic edema 05/15/2017  . Hypertension 05/14/2017  . Anemia 05/11/2017  . Arrhythmia 05/11/2017  . Diverticulosis 05/11/2017  . GERD (gastroesophageal  reflux disease) 05/11/2017  . Hypothyroidism 05/11/2017  . Lower extremity edema 05/11/2017  . Morbid obesity ([place]) 05/11/2017  . Polycystic ovary syndrome 05/11/2017  . OSA on CPAP 05/11/2017  . Neck pain 05/11/2017  . Allergic rhinitis 05/11/2017  . 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

## 2017-08-06 ENCOUNTER — Ambulatory Visit: Payer: BLUE CROSS/BLUE SHIELD | Admitting: Occupational Therapy

## 2017-08-07 ENCOUNTER — Ambulatory Visit: Payer: BLUE CROSS/BLUE SHIELD | Admitting: Occupational Therapy

## 2017-08-07 DIAGNOSIS — I890 Lymphedema, not elsewhere classified: Secondary | ICD-10-CM

## 2017-08-08 NOTE — Therapy (Signed)
[place] MAIN [facility] SERVICES [address] [place], [place], [address] Phone: [phone]   Fax:  [phone]  Occupational Therapy Treatment  Patient Details  Name: Madison Palmer MRN: [id number] Date of Birth: Aug 10, 1965 Referring Provider: Debbora Dus, MD   Encounter Date: 08/07/2017  OT End of Session - 08/07/17 0944    Visit Number  26    Number of Visits  36    Date for OT Re-Evaluation  08/20/17    OT Start Time  0115    OT Stop Time  0230    OT Time Calculation (min)  75 min    Activity Tolerance  Patient tolerated treatment well;Patient limited by pain BLE AROM at all joints limited by body habitus. Pt unable to reach feet without excessive physical effort    Behavior During Therapy  [facility] for tasks assessed/performed       Past Medical History:  Diagnosis Date  . Arrhythmia    heart  . Arthritis   . Cardiomyopathy ([place])   . Chickenpox   . Depression   . Diverticulitis   . GERD (gastroesophageal reflux disease)   . Glaucoma   . Headache   . History of PCOS   . Hyperlipidemia   . Hypertension   . Inflammatory polyps of colon ([place])   . Recurrent UTI   . Thyroid disease   . UTI (urinary tract infection)     Past Surgical History:  Procedure Laterality Date  . BREAST BIOPSY  2015  . CESAREAN SECTION  2004  . INNER EAR SURGERY     ear and sinus surgery  . LAPAROSCOPIC REPAIR AND REMOVAL OF GASTRIC BAND    . OOPHORECTOMY Left   . TONSILLECTOMY AND ADENOIDECTOMY      There were no vitals filed for this visit.  Subjective Assessment - 08/07/17 0940    Subjective   Pt presents for OT visit 27/ 36 to for Intensive Phase CDT to  BLE. Pt presents without compression garments in place. Pt reporting increased leg pain, exacerbated with compression garmentsa and with MLD of late. We discussed possible causes and remedies throughout session.    Pertinent History  onset BLE LE 2004 s/p C section, essential HTN, GERD,  Hypothyroidism, Obesity; OSA ( compliant w/ C-Pap) Lap Band sx s/p 2009 Lap banc removed 2013, OA    Limitations  limited hip AROM- unable to reach feet and distal legs to don socks, compression garments, lace up street shoews, decreased standing and walking tolerance 2/2 leg swelling and associated pain,     Special Tests  Initial Lymphedema Life Impact Scale [facility]) score measures extent of impairment 2/2 lymphedema " in the pst week" at 49%.     Patient Stated Goals  decrease leg edema and get it under better control; explore alternative compression garments/ devices    Currently in Pain?  Yes B legs below knees, L ankle. R knee- not rated numerically    Pain Location  Leg    Pain Orientation  Right;Left    Pain Descriptors / Indicators  Aching;Heaviness;Sore;Discomfort;Tiring    Pain Onset  -- chronic                   OT Treatments/Exercises (OP) - 08/08/17 0001      ADLs   ADL Education Given  Yes      Manual Therapy   Manual Therapy  Manual Lymphatic Drainage (MLD);Edema management    Manual therapy comments  skin care during LLE MLD    Manual Lymphatic Drainage (MLD)  MLD to BLE today as established. Emphasis on Proximal R thigh    Compression Bandaging  Pt donned compression stockings after treatment independently w extra time             OT Education - 08/07/17 0944    Education provided  Yes    Education Details  Continued skilled Pt/caregiver education  And LE ADL training throughout visit for lymphedema self care/ home program, including compression wrapping, compression garment and device wear/care, lymphatic pumping ther ex, simple self-MLD, and skin care. Discussed progress towards goals.     Person(s) Educated  Patient    Methods  Explanation;Demonstration    Comprehension  Verbalized understanding;Returned demonstration          OT Long Term Goals - 05/22/17 0951      OT LONG TERM GOAL #1   Title  Pt modified independent w/ lymphedema  precautions and prevention principals and strategies using printed reference to limit LE progression and infection risk.    Baseline  Max A    Time  2    Period  Weeks    Status  New    Target Date  -- 10th OT visit      OT LONG TERM GOAL #2   Title  Lymphedema (LE) management/ self-care: Pt able to apply knee length, multi layered, gradient compression wraps with minimal assistance using proper techniques within 2 weeks to achieve optimal limb volume reductions in preparation for fitting compression garments/ devices bilaterally.    Baseline  Max A    Time  2    Period  Weeks    Status  New    Target Date  -- 10th OT visit      OT LONG TERM GOAL #3   Title  Lymphedema (LE) management/ self-care:  Pt to achieve at least 10%  BLE limb volume reductions  during Intensive Phase CDT to limit LE progression, to reduce pain, to improve ADLs performance.    Baseline  Max A    Time  12    Period  Weeks    Status  New    Target Date  08/20/17      OT LONG TERM GOAL #4   Title  Lymphedema (LE) management/ self-care:  Pt to tolerate daily compression wraps, compression garments and/ or HOS devices in keeping w/ prescribed wear regime within 1 week of issue date of each to progress and retain clinical and functional gains and to limit LE progression.    Baseline  Max A    Time  12    Period  Weeks    Status  New    Target Date  08/20/17      OT LONG TERM GOAL #5   Title  Lymphedema (LE) self-management/ self-care:  During Management Phase CDT Pt to sustain current limb volumes within 5% using LE self-care skills and strategies learned during Intensive Phase OT independently to control limb swelling and associated pain, to  limit LE progression, to decrease infection risk and to limit further functional decline.    Baseline  Max A    Time  6    Period  Months    Status  New    Target Date  11/24/17            Plan - 08/07/17 0944    Clinical Impression Statement  Pt expreriencing  increased leg pain below the knee,  primarily on the L. This likely due to increased inflamation 2/2 arthritis flare at L ankle joint, fluctuating compliance with compression garments and leg elevation during OT visit intervals leading to LE rebound effect,. Reinterated importance of consistent day and night time compression for optimal control of chronic leg swelling. Without consistent compression  Pt will not retain clinical and functional gains achieved in therapy. Pt disappointed that she is unable to take compression "holidays" on weekend and between visits. She continues to struggle with habituation a rigorous LE self-care routine, but she has mead great progress since commencing CDT. OT will continue to support Pt, praising her successes and providing guidance PRN. Will avoid MLD to LLE below the knee . Pt wilk consider consult with vascular service to assess circulatory issues that may be contributing to leg pain.     Occupational performance deficits (Please refer to evaluation for details):  ADL's;IADL's;Work;Leisure;Social Participation;Other body image    Rehab Potential  Good    OT Frequency  3x / week    OT Duration  12 weeks    OT Treatment/Interventions  Self-care/ADL training;DME and/or AE instruction;Manual Therapy;Patient/family education;Manual lymph drainage;Therapeutic exercise;Compression bandaging;Therapeutic activities;Other (comment) skin care w/ low ph castor oil and/ or lotion during MLD    Recommended Other Services  fit with appropriate BLE compression garments, HOSD devices that are comfortable and Pt is able to don and doff using assistive devices PRN    Consulted and Agree with Plan of Care  Patient       Patient will benefit from skilled therapeutic intervention in order to improve the following deficits and impairments:  Decreased knowledge of use of DME, Decreased skin integrity, Increased edema, Impaired flexibility, Pain, Decreased mobility, Decreased activity  tolerance, Decreased range of motion, Decreased knowledge of precautions, Difficulty walking, Obesity  Visit Diagnosis: Lymphedema, not elsewhere classified    Problem List Patient Active Problem List   Diagnosis Date Noted  . Multinodular goiter 07/27/2017  . Elevated parathyroid hormone 06/13/2017  . Vitamin D deficiency 06/13/2017  . Hx of colonic polyps 05/15/2017  . Eustachian tube dysfunction, right 05/15/2017  . Lymphatic edema 05/15/2017  . Hypertension 05/14/2017  . Anemia 05/11/2017  . Arrhythmia 05/11/2017  . Diverticulosis 05/11/2017  . GERD (gastroesophageal reflux disease) 05/11/2017  . Hypothyroidism 05/11/2017  . Lower extremity edema 05/11/2017  . Morbid obesity ([place]) 05/11/2017  . Polycystic ovary syndrome 05/11/2017  . OSA on CPAP 05/11/2017  . Neck pain 05/11/2017  . Allergic rhinitis 05/11/2017  . 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

## 2017-08-09 ENCOUNTER — Ambulatory Visit: Payer: BLUE CROSS/BLUE SHIELD | Admitting: Occupational Therapy

## 2017-08-09 DIAGNOSIS — I890 Lymphedema, not elsewhere classified: Secondary | ICD-10-CM | POA: Diagnosis not present

## 2017-08-09 NOTE — Therapy (Signed)
[place] MAIN [facility] SERVICES [address] [place], [place], [address] Phone: [phone]   Fax:  [phone]  Occupational Therapy Treatment  Patient Details  Name: Madison Palmer MRN: [id number] Date of Birth: Nov 06, 1965 Referring Provider: Debbora Dus, MD   Encounter Date: 08/09/2017  OT End of Session - 08/09/17 1630    Visit Number  27    Number of Visits  36    Date for OT Re-Evaluation  08/20/17    OT Start Time  0102    OT Stop Time  0200    OT Time Calculation (min)  58 min    Activity Tolerance  Patient tolerated treatment well;Patient limited by pain BLE AROM at all joints limited by body habitus. Pt unable to reach feet without excessive physical effort    Behavior During Therapy  [facility] for tasks assessed/performed       Past Medical History:  Diagnosis Date  . Arrhythmia    heart  . Arthritis   . Cardiomyopathy ([place])   . Chickenpox   . Depression   . Diverticulitis   . GERD (gastroesophageal reflux disease)   . Glaucoma   . Headache   . History of PCOS   . Hyperlipidemia   . Hypertension   . Inflammatory polyps of colon ([place])   . Recurrent UTI   . Thyroid disease   . UTI (urinary tract infection)     Past Surgical History:  Procedure Laterality Date  . BREAST BIOPSY  2015  . CESAREAN SECTION  2004  . INNER EAR SURGERY     ear and sinus surgery  . LAPAROSCOPIC REPAIR AND REMOVAL OF GASTRIC BAND    . OOPHORECTOMY Left   . TONSILLECTOMY AND ADENOIDECTOMY      There were no vitals filed for this visit.                OT Treatments/Exercises (OP) - 08/09/17 0001      ADLs   ADL Education Given  Yes      Manual Therapy   Manual Therapy  Manual Lymphatic Drainage (MLD);Edema management    Manual therapy comments  skin care during LLE MLD    Manual Lymphatic Drainage (MLD)  MLD to BLE today as established. Emphasis on Proximal R thigh    Compression Bandaging  Pt donned compression  stockings after treatment independently w extra time             OT Education - 08/09/17 1630    Education provided  Yes    Education Details  Continued skilled Pt/caregiver education  And LE ADL training throughout visit for lymphedema self care/ home program, including compression wrapping, compression garment and device wear/care, lymphatic pumping ther ex, simple self-MLD, and skin care. Discussed progress towards goals.    Person(s) Educated  Patient    Methods  Explanation;Demonstration    Comprehension  Verbalized understanding;Returned demonstration          OT Long Term Goals - 05/22/17 0951      OT LONG TERM GOAL #1   Title  Pt modified independent w/ lymphedema precautions and prevention principals and strategies using printed reference to limit LE progression and infection risk.    Baseline  Max A    Time  2    Period  Weeks    Status  New    Target Date  -- 10th OT visit      OT LONG TERM GOAL #2  Title  Lymphedema (LE) management/ self-care: Pt able to apply knee length, multi layered, gradient compression wraps with minimal assistance using proper techniques within 2 weeks to achieve optimal limb volume reductions in preparation for fitting compression garments/ devices bilaterally.    Baseline  Max A    Time  2    Period  Weeks    Status  New    Target Date  -- 10th OT visit      OT LONG TERM GOAL #3   Title  Lymphedema (LE) management/ self-care:  Pt to achieve at least 10%  BLE limb volume reductions  during Intensive Phase CDT to limit LE progression, to reduce pain, to improve ADLs performance.    Baseline  Max A    Time  12    Period  Weeks    Status  New    Target Date  08/20/17      OT LONG TERM GOAL #4   Title  Lymphedema (LE) management/ self-care:  Pt to tolerate daily compression wraps, compression garments and/ or HOS devices in keeping w/ prescribed wear regime within 1 week of issue date of each to progress and retain clinical and  functional gains and to limit LE progression.    Baseline  Max A    Time  12    Period  Weeks    Status  New    Target Date  08/20/17      OT LONG TERM GOAL #5   Title  Lymphedema (LE) self-management/ self-care:  During Management Phase CDT Pt to sustain current limb volumes within 5% using LE self-care skills and strategies learned during Intensive Phase OT independently to control limb swelling and associated pain, to  limit LE progression, to decrease infection risk and to limit further functional decline.    Baseline  Max A    Time  6    Period  Months    Status  New    Target Date  11/24/17            Plan - 08/09/17 1631    Clinical Impression Statement  Pt reports she is working on a referral for a vascular consult t explore recenly worsening leg pain. Pt had less pain during MLD today with pillows under knees bilaterally to limit hyperextension. Pt tolerated MLD without difficulty. Garments still have not arrived  from  spouse, where vendor sent them incorrectly. Pt has reached clinical plateau and plan to decrease Rx to 1 x weekly until garments are available for fitting.     Occupational performance deficits (Please refer to evaluation for details):  ADL's;IADL's;Work;Leisure;Social Participation;Other body image    Rehab Potential  Good    OT Frequency  3x / week    OT Duration  12 weeks    OT Treatment/Interventions  Self-care/ADL training;DME and/or AE instruction;Manual Therapy;Patient/family education;Manual lymph drainage;Therapeutic exercise;Compression bandaging;Therapeutic activities;Other (comment) skin care w/ low ph castor oil and/ or lotion during MLD    Recommended Other Services  fit with appropriate BLE compression garments, HOSD devices that are comfortable and Pt is able to don and doff using assistive devices PRN    Consulted and Agree with Plan of Care  Patient       Patient will benefit from skilled therapeutic intervention in order to improve the  following deficits and impairments:  Decreased knowledge of use of DME, Decreased skin integrity, Increased edema, Impaired flexibility, Pain, Decreased mobility, Decreased activity tolerance, Decreased range of motion, Decreased knowledge of  precautions, Difficulty walking, Obesity  Visit Diagnosis: Lymphedema, not elsewhere classified    Problem List Patient Active Problem List   Diagnosis Date Noted  . Multinodular goiter 07/27/2017  . Elevated parathyroid hormone 06/13/2017  . Vitamin D deficiency 06/13/2017  . Hx of colonic polyps 05/15/2017  . Eustachian tube dysfunction, right 05/15/2017  . Lymphatic edema 05/15/2017  . Hypertension 05/14/2017  . Anemia 05/11/2017  . Arrhythmia 05/11/2017  . Diverticulosis 05/11/2017  . GERD (gastroesophageal reflux disease) 05/11/2017  . Hypothyroidism 05/11/2017  . Lower extremity edema 05/11/2017  . Morbid obesity ([place]) 05/11/2017  . Polycystic ovary syndrome 05/11/2017  . OSA on CPAP 05/11/2017  . Neck pain 05/11/2017  . Allergic rhinitis 05/11/2017  . 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 [place], [place], [address] Phone: [phone]   Fax:  [phone]  Name: REIGHLYNN SWINEY MRN: [id number] Date of Birth: 1965-12-30

## 2017-08-13 ENCOUNTER — Ambulatory Visit: Payer: BLUE CROSS/BLUE SHIELD | Admitting: Occupational Therapy

## 2017-08-13 DIAGNOSIS — I890 Lymphedema, not elsewhere classified: Secondary | ICD-10-CM

## 2017-08-13 NOTE — Therapy (Signed)
[place] MAIN [facility] SERVICES [address] [place], [place], [address] Phone: [phone]   Fax:  [phone]  Occupational Therapy Treatment  Patient Details  Name: Madison Palmer MRN: [id number] Date of Birth: 1965-09-23 Referring Provider: Debbora Dus, MD   Encounter Date: 08/13/2017  OT End of Session - 08/13/17 1542    Visit Number  28    Number of Visits  36    Date for OT Re-Evaluation  08/20/17    OT Start Time  0105    OT Stop Time  0210    OT Time Calculation (min)  65 min    Activity Tolerance  Patient tolerated treatment well;Patient limited by pain BLE AROM at all joints limited by body habitus. Pt unable to reach feet without excessive physical effort    Behavior During Therapy  [facility] for tasks assessed/performed       Past Medical History:  Diagnosis Date  . Arrhythmia    heart  . Arthritis   . Cardiomyopathy ([place])   . Chickenpox   . Depression   . Diverticulitis   . GERD (gastroesophageal reflux disease)   . Glaucoma   . Headache   . History of PCOS   . Hyperlipidemia   . Hypertension   . Inflammatory polyps of colon ([place])   . Recurrent UTI   . Thyroid disease   . UTI (urinary tract infection)     Past Surgical History:  Procedure Laterality Date  . BREAST BIOPSY  2015  . CESAREAN SECTION  2004  . INNER EAR SURGERY     ear and sinus surgery  . LAPAROSCOPIC REPAIR AND REMOVAL OF GASTRIC BAND    . OOPHORECTOMY Left   . TONSILLECTOMY AND ADENOIDECTOMY      There were no vitals filed for this visit.  Subjective Assessment - 08/13/17 1537    Subjective   Pt presents for OT visit 28/ 36 to for Intensive Phase CDT to  BLE. Pt presents without compression garments in place. Pt reports she has not used       compression garments since last seen last Thurs, April 11 because after donning them her legs begin to hurt. Pt has not yet used  pneumatic compression device  that was delivered ~ 1 1/2 weeks ago.     Pertinent History  onset BLE LE 2004 s/p C section, essential HTN, GERD, Hypothyroidism, Obesity; OSA ( compliant w/ C-Pap) Lap Band sx s/p 2009 Lap banc removed 2013, OA    Limitations  limited hip AROM- unable to reach feet and distal legs to don socks, compression garments, lace up street shoews, decreased standing and walking tolerance 2/2 leg swelling and associated pain,     Special Tests  Initial Lymphedema Life Impact Scale [facility]) score measures extent of impairment 2/2 lymphedema " in the pst week" at 49%.     Patient Stated Goals  decrease leg edema and get it under better control; explore alternative compression garments/ devices    Currently in Pain?  Yes not rated numerically    Pain Location  Leg    Pain Orientation  Right;Left    Pain Descriptors / Indicators  Aching    Pain Type  Acute pain    Pain Onset  -- chronic    Aggravating Factors   custom, flat kniot, ccl 2, compression knee highs by report  OT Treatments/Exercises (OP) - 08/13/17 0001      ADLs   ADL Education Given  Yes      Manual Therapy   Manual Therapy  Manual Lymphatic Drainage (MLD);Compression Bandaging;Edema management    Manual therapy comments  skin care during LLE MLD    Manual Lymphatic Drainage (MLD)  MLD to BLE today as established. Emphasis on Proximal R thigh    Compression Bandaging  Pt donned compression stockings after treatment independently w extra time             OT Education - 08/13/17 1540    Education provided  Yes    Education Details  Pt edu re differential dx for swelling, including systemic causes, vascular and arterial issues. Discussed rebounding effect.    Person(s) Educated  Patient    Methods  Explanation;Demonstration    Comprehension  Verbalized understanding;Returned demonstration;Need further instruction          OT Long Term Goals - 05/22/17 0951      OT LONG TERM GOAL #1   Title  Pt modified independent w/ lymphedema  precautions and prevention principals and strategies using printed reference to limit LE progression and infection risk.    Baseline  Max A    Time  2    Period  Weeks    Status  New    Target Date  -- 10th OT visit      OT LONG TERM GOAL #2   Title  Lymphedema (LE) management/ self-care: Pt able to apply knee length, multi layered, gradient compression wraps with minimal assistance using proper techniques within 2 weeks to achieve optimal limb volume reductions in preparation for fitting compression garments/ devices bilaterally.    Baseline  Max A    Time  2    Period  Weeks    Status  New    Target Date  -- 10th OT visit      OT LONG TERM GOAL #3   Title  Lymphedema (LE) management/ self-care:  Pt to achieve at least 10%  BLE limb volume reductions  during Intensive Phase CDT to limit LE progression, to reduce pain, to improve ADLs performance.    Baseline  Max A    Time  12    Period  Weeks    Status  New    Target Date  08/20/17      OT LONG TERM GOAL #4   Title  Lymphedema (LE) management/ self-care:  Pt to tolerate daily compression wraps, compression garments and/ or HOS devices in keeping w/ prescribed wear regime within 1 week of issue date of each to progress and retain clinical and functional gains and to limit LE progression.    Baseline  Max A    Time  12    Period  Weeks    Status  New    Target Date  08/20/17      OT LONG TERM GOAL #5   Title  Lymphedema (LE) self-management/ self-care:  During Management Phase CDT Pt to sustain current limb volumes within 5% using LE self-care skills and strategies learned during Intensive Phase OT independently to control limb swelling and associated pain, to  limit LE progression, to decrease infection risk and to limit further functional decline.    Baseline  Max A    Time  6    Period  Months    Status  New    Target Date  11/24/17  Plan - 08/13/17 1542    Clinical Impression Statement  Pt has achieved  clinical plateau in terms of progress towards swelling reduction goal. Compliance w/ LE self care is slipping a bit as she reports no compression on weekends and between visits. New compression device is un`used as yet. Compression garments were sent by DME vendor to Pt's spouse's address in [place]  ~ 2 weeks ago and he has not sent them to Pt as requextedd, so we have been unable to comp-lete fitting. Pt in agreement w/ plan to commence transition to management Phase CDT. OT frequency [place] decreased to 1 x weekly going forward. We'll fit garments asap and assist w/ pump issues PRN.     Occupational performance deficits (Please refer to evaluation for details):  ADL's;IADL's;Work;Leisure;Social Participation;Other body image    Rehab Potential  Good    OT Frequency  3x / week    OT Duration  12 weeks    OT Treatment/Interventions  Self-care/ADL training;DME and/or AE instruction;Manual Therapy;Patient/family education;Manual lymph drainage;Therapeutic exercise;Compression bandaging;Therapeutic activities;Other (comment) skin care w/ low ph castor oil and/ or lotion during MLD    Recommended Other Services  fit with appropriate BLE compression garments, HOSD devices that are comfortable and Pt is able to don and doff using assistive devices PRN    Consulted and Agree with Plan of Care  Patient       Patient will benefit from skilled therapeutic intervention in order to improve the following deficits and impairments:  Decreased knowledge of use of DME, Decreased skin integrity, Increased edema, Impaired flexibility, Pain, Decreased mobility, Decreased activity tolerance, Decreased range of motion, Decreased knowledge of precautions, Difficulty walking, Obesity  Visit Diagnosis: Lymphedema, not elsewhere classified    Problem List Patient Active Problem List   Diagnosis Date Noted  . Multinodular goiter 07/27/2017  . Elevated parathyroid hormone 06/13/2017  . Vitamin D deficiency 06/13/2017  .  Hx of colonic polyps 05/15/2017  . Eustachian tube dysfunction, right 05/15/2017  . Lymphatic edema 05/15/2017  . Hypertension 05/14/2017  . Anemia 05/11/2017  . Arrhythmia 05/11/2017  . Diverticulosis 05/11/2017  . GERD (gastroesophageal reflux disease) 05/11/2017  . Hypothyroidism 05/11/2017  . Lower extremity edema 05/11/2017  . Morbid obesity ([place]) 05/11/2017  . Polycystic ovary syndrome 05/11/2017  . OSA on CPAP 05/11/2017  . Neck pain 05/11/2017  . Allergic rhinitis 05/11/2017  . 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 [place], [place], [address] Phone: [phone]   Fax:  [phone]  Name: Madison [place] MRN: [id number] Date of Birth: [birth year]

## 2017-08-14 ENCOUNTER — Ambulatory Visit: Payer: BLUE CROSS/BLUE SHIELD | Admitting: Occupational Therapy

## 2017-08-14 DIAGNOSIS — I890 Lymphedema, not elsewhere classified: Secondary | ICD-10-CM | POA: Diagnosis not present

## 2017-08-15 NOTE — Therapy (Signed)
[place] MAIN [facility] SERVICES [address] [place], [place], [address] Phone: [phone]   Fax:  [phone]  Occupational Therapy Treatment  Patient Details  Name: Madison Palmer MRN: [id number] Date of Birth: 1966/01/25 Referring Provider: Debbora Dus, MD   Encounter Date: 08/14/2017  OT End of Session - 08/14/17 0922    Visit Number  27    Number of Visits  36    Date for OT Re-Evaluation  08/20/17    OT Start Time  0105    OT Stop Time  0205    OT Time Calculation (min)  60 min    Activity Tolerance  Patient tolerated treatment well;Patient limited by pain BLE AROM at all joints limited by body habitus. Pt unable to reach feet without excessive physical effort    Behavior During Therapy  [facility] for tasks assessed/performed       Past Medical History:  Diagnosis Date  . Arrhythmia    heart  . Arthritis   . Cardiomyopathy ([place])   . Chickenpox   . Depression   . Diverticulitis   . GERD (gastroesophageal reflux disease)   . Glaucoma   . Headache   . History of PCOS   . Hyperlipidemia   . Hypertension   . Inflammatory polyps of colon ([place])   . Recurrent UTI   . Thyroid disease   . UTI (urinary tract infection)     Past Surgical History:  Procedure Laterality Date  . BREAST BIOPSY  2015  . CESAREAN SECTION  2004  . INNER EAR SURGERY     ear and sinus surgery  . LAPAROSCOPIC REPAIR AND REMOVAL OF GASTRIC BAND    . OOPHORECTOMY Left   . TONSILLECTOMY AND ADENOIDECTOMY      There were no vitals filed for this visit.  Subjective Assessment - 08/14/17 0920    Subjective   Pt presents for OT visit 29/ 36 to for Intensive Phase CDT to  BLE. Pt presents without compression garments in place. Pt reports her spouse has not yet shipped her compression garment  to her. This has significantly delayed the fitting process and mayn negatively impact remake status, if needed.    Pertinent History  onset BLE LE 2004 s/p C section,  essential HTN, GERD, Hypothyroidism, Obesity; OSA ( compliant w/ C-Pap) Lap Band sx s/p 2009 Lap banc removed 2013, OA    Limitations  limited hip AROM- unable to reach feet and distal legs to don socks, compression garments, lace up street shoews, decreased standing and walking tolerance 2/2 leg swelling and associated pain,     Special Tests  Initial Lymphedema Life Impact Scale [facility]) score measures extent of impairment 2/2 lymphedema " in the pst week" at 49%.     Patient Stated Goals  decrease leg edema and get it under better control; explore alternative compression garments/ devices    Currently in Pain?  No/denies    Pain Onset  -- chronic                   OT Treatments/Exercises (OP) - 08/15/17 0001      ADLs   ADL Education Given  Yes      Manual Therapy   Manual Therapy  Manual Lymphatic Drainage (MLD);Compression Bandaging;Edema management    Manual therapy comments  skin care to BLE during MLD    Manual Lymphatic Drainage (MLD)  MLD to BLE today as established. Emphasis on Proximal R thigh  Compression Bandaging  Pt donned compression stockings after treatment independently w extra time                  OT Long Term Goals - 05/22/17 0951      OT LONG TERM GOAL #1   Title  Pt modified independent w/ lymphedema precautions and prevention principals and strategies using printed reference to limit LE progression and infection risk.    Baseline  Max A    Time  2    Period  Weeks    Status  New    Target Date  -- 10th OT visit      OT LONG TERM GOAL #2   Title  Lymphedema (LE) management/ self-care: Pt able to apply knee length, multi layered, gradient compression wraps with minimal assistance using proper techniques within 2 weeks to achieve optimal limb volume reductions in preparation for fitting compression garments/ devices bilaterally.    Baseline  Max A    Time  2    Period  Weeks    Status  New    Target Date  -- 10th OT visit      OT  LONG TERM GOAL #3   Title  Lymphedema (LE) management/ self-care:  Pt to achieve at least 10%  BLE limb volume reductions  during Intensive Phase CDT to limit LE progression, to reduce pain, to improve ADLs performance.    Baseline  Max A    Time  12    Period  Weeks    Status  New    Target Date  08/20/17      OT LONG TERM GOAL #4   Title  Lymphedema (LE) management/ self-care:  Pt to tolerate daily compression wraps, compression garments and/ or HOS devices in keeping w/ prescribed wear regime within 1 week of issue date of each to progress and retain clinical and functional gains and to limit LE progression.    Baseline  Max A    Time  12    Period  Weeks    Status  New    Target Date  08/20/17      OT LONG TERM GOAL #5   Title  Lymphedema (LE) self-management/ self-care:  During Management Phase CDT Pt to sustain current limb volumes within 5% using LE self-care skills and strategies learned during Intensive Phase OT independently to control limb swelling and associated pain, to  limit LE progression, to decrease infection risk and to limit further functional decline.    Baseline  Max A    Time  6    Period  Months    Status  New    Target Date  11/24/17            Plan - 08/14/17 3419    Clinical Impression Statement  Pt's spouse has yet to ship custom compression garment to her, whicyh  significantly delayed the fitting process. This delay  is likely to negatively impact remake access as manufacturer allows a 10 day window of time for reporting problems for garments. Pt has not yet utilized Flexitouch sequential pneumatic compression device despite completing fitting and training process with manufacturer. Progress towards goa has slowed and Pt  has reached clinical plateau for limb volume reduction. We'll continue OT 1 x weekly toassist Pt w/ transition to CDT self management phase while awaiting custom garment fitting. Pt in agreement w/ plan.    Occupational performance  deficits (Please refer to evaluation for details):  ADL's;IADL's;Work;Leisure;Social Participation;Other body  image    Rehab Potential  Good    OT Frequency  3x / week    OT Duration  12 weeks    OT Treatment/Interventions  Self-care/ADL training;DME and/or AE instruction;Manual Therapy;Patient/family education;Manual lymph drainage;Therapeutic exercise;Compression bandaging;Therapeutic activities;Other (comment) skin care w/ low ph castor oil and/ or lotion during MLD    Recommended Other Services  fit with appropriate BLE compression garments, HOSD devices that are comfortable and Pt is able to don and doff using assistive devices PRN    Consulted and Agree with Plan of Care  Patient       Patient will benefit from skilled therapeutic intervention in order to improve the following deficits and impairments:  Decreased knowledge of use of DME, Decreased skin integrity, Increased edema, Impaired flexibility, Pain, Decreased mobility, Decreased activity tolerance, Decreased range of motion, Decreased knowledge of precautions, Difficulty walking, Obesity  Visit Diagnosis: Lymphedema, not elsewhere classified    Problem List Patient Active Problem List   Diagnosis Date Noted  . Multinodular goiter 07/27/2017  . Elevated parathyroid hormone 06/13/2017  . Vitamin D deficiency 06/13/2017  . Hx of colonic polyps 05/15/2017  . Eustachian tube dysfunction, right 05/15/2017  . Lymphatic edema 05/15/2017  . Hypertension 05/14/2017  . Anemia 05/11/2017  . Arrhythmia 05/11/2017  . Diverticulosis 05/11/2017  . GERD (gastroesophageal reflux disease) 05/11/2017  . Hypothyroidism 05/11/2017  . Lower extremity edema 05/11/2017  . Morbid obesity ([place]) 05/11/2017  . Polycystic ovary syndrome 05/11/2017  . OSA on CPAP 05/11/2017  . Neck pain 05/11/2017  . Allergic rhinitis 05/11/2017  . 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 [place], [place], [address] Phone: [phone]   Fax:  [phone]  Name: Madison [place] MRN: [id number] Date of Birth: May 06, 1965

## 2017-08-16 ENCOUNTER — Ambulatory Visit: Payer: BLUE CROSS/BLUE SHIELD | Admitting: Occupational Therapy

## 2017-08-16 DIAGNOSIS — I890 Lymphedema, not elsewhere classified: Secondary | ICD-10-CM

## 2017-08-16 NOTE — Therapy (Addendum)
[place] MAIN [facility] SERVICES [address] [place], [place], [address] Phone: [phone]   Fax:  [phone]  Occupational Therapy Treatment Note and Progress Report    Patient Details  Name: Madison Palmer MRN: [id number] Date of Birth: 12/19/1965 Referring Provider: Debbora Dus, MD   Encounter Date: 08/16/2017  OT End of Session - 08/16/17 1623    Visit Number  30    Number of Visits  36    Date for OT Re-Evaluation  08/20/17    OT Start Time  0115    OT Stop Time  0205    OT Time Calculation (min)  50 min    Activity Tolerance  Patient tolerated treatment well;Patient limited by pain BLE AROM at all joints limited by body habitus. Pt unable to reach feet without excessive physical effort    Behavior During Therapy  [facility] for tasks assessed/performed       Past Medical History:  Diagnosis Date  . Arrhythmia    heart  . Arthritis   . Cardiomyopathy ([place])   . Chickenpox   . Depression   . Diverticulitis   . GERD (gastroesophageal reflux disease)   . Glaucoma   . Headache   . History of PCOS   . Hyperlipidemia   . Hypertension   . Inflammatory polyps of colon ([place])   . Recurrent UTI   . Thyroid disease   . UTI (urinary tract infection)     Past Surgical History:  Procedure Laterality Date  . BREAST BIOPSY  2015  . CESAREAN SECTION  2004  . INNER EAR SURGERY     ear and sinus surgery  . LAPAROSCOPIC REPAIR AND REMOVAL OF GASTRIC BAND    . OOPHORECTOMY Left   . TONSILLECTOMY AND ADENOIDECTOMY      There were no vitals filed for this visit.  Subjective Assessment - 08/16/17 1622    Subjective   Pt presents for OT visit 30/ 36 to for Intensive Phase CDT to  BLE. Pt presents without compression garments in place. Pt reports her spouse has not yet shipped her compression garment  to her. This has significantly delayed the fitting process and mayn negatively impact remake status, if needed.    Pertinent History  onset  BLE LE 2004 s/p C section, essential HTN, GERD, Hypothyroidism, Obesity; OSA ( compliant w/ C-Pap) Lap Band sx s/p 2009 Lap banc removed 2013, OA    Limitations  limited hip AROM- unable to reach feet and distal legs to don socks, compression garments, lace up street shoews, decreased standing and walking tolerance 2/2 leg swelling and associated pain,     Special Tests  Initial Lymphedema Life Impact Scale [facility]) score measures extent of impairment 2/2 lymphedema " in the pst week" at 49%.     Patient Stated Goals  decrease leg edema and get it under better control; explore alternative compression garments/ devices    Currently in Pain?  No/denies    Pain Onset  -- chronic                   OT Treatments/Exercises (OP) - 08/16/17 0001      ADLs   ADL Education Given  Yes      Manual Therapy   Manual Therapy  Manual Lymphatic Drainage (MLD);Compression Bandaging;Edema management    Manual therapy comments  skin care to BLE during MLD    Manual Lymphatic Drainage (MLD)  MLD to BLE today as established.  Emphasis on Proximal R thigh    Compression Bandaging  Pt donned compression stockings after treatment independently w extra time             OT Education - 08/16/17 1623    Education provided  Yes    Education Details  Continued skilled Pt/caregiver education  And LE ADL training throughout visit for lymphedema self care/ home program, including compression wrapping, compression garment and device wear/care, lymphatic pumping ther ex, simple self-MLD, and skin care. Discussed progress towards goals.     Person(s) Educated  Patient    Methods  Explanation;Demonstration    Comprehension  Verbalized understanding;Returned demonstration;Need further instruction          OT Long Term Goals - 08/16/17 1625      OT LONG TERM GOAL #1   Title  Pt modified independent w/ lymphedema precautions and prevention principals and strategies using printed reference to limit LE  progression and infection risk.    Baseline  Max A    Time  2    Period  Weeks    Status  Achieved      OT LONG TERM GOAL #2   Title  Lymphedema (LE) management/ self-care: Pt able to apply knee length, multi layered, gradient compression wraps with minimal assistance using proper techniques within 2 weeks to achieve optimal limb volume reductions in preparation for fitting compression garments/ devices bilaterally.    Baseline  Max A.  08/16/17 Pt able to apply compression wraps with extra time and supported positioning    Time  2    Period  Weeks    Status  Achieved      OT LONG TERM GOAL #3   Title  Lymphedema (LE) management/ self-care:  Pt to achieve at least 10%  BLE limb volume reductions  during Intensive Phase CDT to limit LE progression, to reduce pain, to improve ADLs performance.    Baseline  Max A 08/16/17:m Pt able to perform neck sequence and deep breathing, but unable to reach below knees to perform distal self MLD, so she has obtained a Flexitouch sequential pneumatic compression device to assist w/ daily lymphatic decongestion on alternating legs. Pt has yet to use device at home because she needs assistance from family members to don and doff pump garments.    Time  12    Period  Weeks    Status  Partially Met      OT LONG TERM GOAL #4   Title  Lymphedema (LE) management/ self-care:  Pt to tolerate daily compression wraps, compression garments and/ or HOS devices in keeping w/ prescribed wear regime within 1 week of issue date of each to progress and retain clinical and functional gains and to limit LE progression.    Baseline  Max A    Time  12    Period  Weeks    Status  New      OT LONG TERM GOAL #5   Title  Lymphedema (LE) self-management/ self-care:  During Management Phase CDT Pt to sustain current limb volumes within 5% using LE self-care skills and strategies learned during Intensive Phase OT independently to control limb swelling and associated pain, to  limit LE  progression, to decrease infection risk and to limit further functional decline.    Baseline  Max A .     Time  6    Period  Months    Status  On-going  Plan - 08/16/17 1636    Clinical Impression Statement  Pt tolerated MLD, skin care and compression to VBLE after manual therapy without difficulty today. Pt able to don comnpression stockings independently with extra timee with supported positioning. She no longer requires assistive devices.We'll see Pt on the coming Monday if her compression garemtns arrive over the weekened. If they do no we'll see her once later in the week as she transitions to Management Phase CDT.    Occupational performance deficits (Please refer to evaluation for details):  ADL's;IADL's;Work;Leisure;Social Participation;Other body image    Rehab Potential  Good    OT Frequency  3x / week    OT Duration  12 weeks    OT Treatment/Interventions  Self-care/ADL training;DME and/or AE instruction;Manual Therapy;Patient/family education;Manual lymph drainage;Therapeutic exercise;Compression bandaging;Therapeutic activities;Other (comment) skin care w/ low ph castor oil and/ or lotion during MLD    Recommended Other Services  fit with appropriate BLE compression garments, HOSD devices that are comfortable and Pt is able to don and doff using assistive devices PRN    Consulted and Agree with Plan of Care  Patient       Patient will benefit from skilled therapeutic intervention in order to improve the following deficits and impairments:  Decreased knowledge of use of DME, Decreased skin integrity, Increased edema, Impaired flexibility, Pain, Decreased mobility, Decreased activity tolerance, Decreased range of motion, Decreased knowledge of precautions, Difficulty walking, Obesity  Visit Diagnosis: Lymphedema, not elsewhere classified    Problem List Patient Active Problem List   Diagnosis Date Noted  . Multinodular goiter 07/27/2017  . Elevated parathyroid  hormone 06/13/2017  . Vitamin D deficiency 06/13/2017  . Hx of colonic polyps 05/15/2017  . Eustachian tube dysfunction, right 05/15/2017  . Lymphatic edema 05/15/2017  . Hypertension 05/14/2017  . Anemia 05/11/2017  . Arrhythmia 05/11/2017  . Diverticulosis 05/11/2017  . GERD (gastroesophageal reflux disease) 05/11/2017  . Hypothyroidism 05/11/2017  . Lower extremity edema 05/11/2017  . Morbid obesity ([place]) 05/11/2017  . Polycystic ovary syndrome 05/11/2017  . OSA on CPAP 05/11/2017  . Neck pain 05/11/2017  . Allergic rhinitis 05/11/2017  . 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

## 2017-08-19 ENCOUNTER — Telehealth: Payer: Self-pay | Admitting: Endocrinology

## 2017-08-19 NOTE — Telephone Encounter (Signed)
please call patient: I received records from [place], but they don't include bx results.  We'll follow here.  Please come back for a follow-up appointment in 6 months.

## 2017-08-20 ENCOUNTER — Ambulatory Visit: Payer: BLUE CROSS/BLUE SHIELD | Admitting: Occupational Therapy

## 2017-08-20 DIAGNOSIS — I890 Lymphedema, not elsewhere classified: Secondary | ICD-10-CM

## 2017-08-20 NOTE — Telephone Encounter (Signed)
Patient has follow up made for September.

## 2017-08-21 ENCOUNTER — Encounter: Payer: BLUE CROSS/BLUE SHIELD | Admitting: Occupational Therapy

## 2017-08-21 NOTE — Therapy (Signed)
[place] MAIN [facility] SERVICES [address] [place], [place], [address] Phone: [phone]   Fax:  [phone]  Occupational Therapy Treatment Note and Progress Report: Lymphedema Care  Patient Details  Name: Madison Palmer MRN: [id number] Date of Birth: 15-Aug-1965 Referring Provider: Debbora Dus, MD   Encounter Date: 08/20/2017  OT End of Session - 08/20/17 1111    Visit Number  31    Number of Visits  36    Date for OT Re-Evaluation  11/18/17    OT Start Time  0215    OT Stop Time  0310    OT Time Calculation (min)  55 min    Activity Tolerance  Patient tolerated treatment well;Patient limited by pain BLE AROM at all joints limited by body habitus. Pt unable to reach feet without excessive physical effort    Behavior During Therapy  [facility] for tasks assessed/performed       Past Medical History:  Diagnosis Date  . Arrhythmia    heart  . Arthritis   . Cardiomyopathy ([place])   . Chickenpox   . Depression   . Diverticulitis   . GERD (gastroesophageal reflux disease)   . Glaucoma   . Headache   . History of PCOS   . Hyperlipidemia   . Hypertension   . Inflammatory polyps of colon ([place])   . Recurrent UTI   . Thyroid disease   . UTI (urinary tract infection)     Past Surgical History:  Procedure Laterality Date  . BREAST BIOPSY  2015  . CESAREAN SECTION  2004  . INNER EAR SURGERY     ear and sinus surgery  . LAPAROSCOPIC REPAIR AND REMOVAL OF GASTRIC BAND    . OOPHORECTOMY Left   . TONSILLECTOMY AND ADENOIDECTOMY      There were no vitals filed for this visit.  Subjective Assessment - 08/20/17 1113    Subjective   Pt 15 minutes late for 6-0 minute appointment. Pt presents for OT visit 31/ 36 with new, custom, capri-length compression calass 2 pantyhose in place over existing knee high compression stockings, also ccl 2. Pt states that she was able to don garment with extra time, but she doesnt feel it is is "on right"  and doesn't fit properly in crotch area.     Pertinent History  onset BLE LE 2004 s/p C section, essential HTN, GERD, Hypothyroidism, Obesity; OSA ( compliant w/ C-Pap) Lap Band sx s/p 2009 Lap banc removed 2013, OA    Limitations  limited hip AROM- unable to reach feet and distal legs to don socks, compression garments, lace up street shoews, decreased standing and walking tolerance 2/2 leg swelling and associated pain,     Special Tests  Initial Lymphedema Life Impact Scale [facility]) score measures extent of impairment 2/2 lymphedema " in the pst week" at 49%.     Patient Stated Goals  decrease leg edema and get it under better control; explore alternative compression garments/ devices    Currently in Pain?  No/denies    Pain Onset  -- chronic                   OT Treatments/Exercises (OP) - 08/21/17 0001      ADLs   ADL Education Given  Yes      Manual Therapy   Manual Therapy  Edema management    Compression Bandaging  New custom, ccl 2 Elvarex capri-length pantyhose fitted and assessed  OT Education - 08/20/17 1119    Education provided  Yes    Education Details  Pt edu for custom compression garment donning, fitting and laundry instructions    Person(s) Educated  Patient    Methods  Explanation;Demonstration;Tactile cues;Verbal cues;Handout    Comprehension  Verbalized understanding;Returned demonstration;Verbal cues required;Need further instruction          OT Long Term Goals - 08/21/17 1220      OT LONG TERM GOAL #1   Title  Pt modified independent w/ lymphedema precautions and prevention principals and strategies using printed reference to limit LE progression and infection risk.    Baseline  Max A    Time  2    Period  Weeks    Status  Achieved      OT LONG TERM GOAL #2   Title  Lymphedema (LE) management/ self-care: Pt able to apply knee length, multi layered, gradient compression wraps with minimal assistance using proper techniques  within 2 weeks to achieve optimal limb volume reductions in preparation for fitting compression garments/ devices bilaterally.    Baseline  Max A.  08/16/17 Pt able to apply compression wraps with extra time and supported positioning    Time  2    Period  Weeks    Status  Achieved      OT LONG TERM GOAL #3   Title  Lymphedema (LE) management/ self-care:  Pt to achieve at least 10%  BLE limb volume reductions  during Intensive Phase CDT to limit LE progression, to reduce pain, to improve ADLs performance.    Baseline  Max A 08/16/17:m Pt able to perform neck sequence and deep breathing, but unable to reach below knees to perform distal self MLD, so she has obtained a Flexitouch sequential pneumatic compression device to assist w/ daily lymphatic decongestion on alternating legs. Pt has yet to use device at home because she needs assistance from family members to don and doff pump garments.    Time  12    Period  Weeks    Status  Partially Met      OT LONG TERM GOAL #4   Title  Lymphedema (LE) management/ self-care:  Pt to tolerate daily compression wraps, compression garments and/ or HOS devices in keeping w/ prescribed wear regime within 1 week of issue date of each to progress and retain clinical and functional gains and to limit LE progression.    Baseline  Max A    Time  12    Period  Weeks    Status  Achieved      OT LONG TERM GOAL #5   Title  Lymphedema (LE) self-management/ self-care:  During Management Phase CDT Pt to sustain current limb volumes within 5% using LE self-care skills and strategies learned during Intensive Phase OT independently to control limb swelling and associated pain, to  limit LE progression, to decrease infection risk and to limit further functional decline.    Baseline  Max A .     Time  6    Period  Months    Status  On-going            Plan - 08/21/17 1119    Clinical Impression Statement  nEW, CUSTOM, eLVAREX, CCL 2, FLAT KNIT, CAPpri-length  compression pantyhose appear to fit fairly well  after assisting Pt with donning so seams and borders    are aligned in correct positions. Groin seams are very difficult to position due to very large abdominal  panis hanging nearly to Pty's knees. Garment does not contain pannis , but it soes provide appropriate compression and containment to medial leg lobules. Pt to wash and wear daily until next visit to further assess function. We'll address  measurement adjustments at next visit.    Occupational performance deficits (Please refer to evaluation for details):  ADL's;IADL's;Work;Leisure;Social Participation;Other body image    Rehab Potential  Good    OT Frequency  3x / week    OT Duration  12 weeks    OT Treatment/Interventions  Self-care/ADL training;DME and/or AE instruction;Manual Therapy;Patient/family education;Manual lymph drainage;Therapeutic exercise;Compression bandaging;Therapeutic activities;Other (comment) skin care w/ low ph castor oil and/ or lotion during MLD    Recommended Other Services  fit with appropriate BLE compression garments, HOSD devices that are comfortable and Pt is able to don and doff using assistive devices PRN    Consulted and Agree with Plan of Care  Patient       Patient will benefit from skilled therapeutic intervention in order to improve the following deficits and impairments:  Decreased knowledge of use of DME, Decreased skin integrity, Increased edema, Impaired flexibility, Pain, Decreased mobility, Decreased activity tolerance, Decreased range of motion, Decreased knowledge of precautions, Difficulty walking, Obesity  Visit Diagnosis: Lymphedema, not elsewhere classified - Plan: Ot plan of care cert/re-cert    Problem List Patient Active Problem List   Diagnosis Date Noted  . Multinodular goiter 07/27/2017  . Elevated parathyroid hormone 06/13/2017  . Vitamin D deficiency 06/13/2017  . Hx of colonic polyps 05/15/2017  . Eustachian tube dysfunction,  right 05/15/2017  . Lymphatic edema 05/15/2017  . Hypertension 05/14/2017  . Anemia 05/11/2017  . Arrhythmia 05/11/2017  . Diverticulosis 05/11/2017  . GERD (gastroesophageal reflux disease) 05/11/2017  . Hypothyroidism 05/11/2017  . Lower extremity edema 05/11/2017  . Morbid obesity ([place]) 05/11/2017  . Polycystic ovary syndrome 05/11/2017  . OSA on CPAP 05/11/2017  . Neck pain 05/11/2017  . Allergic rhinitis 05/11/2017  . 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

## 2017-08-23 ENCOUNTER — Ambulatory Visit: Payer: BLUE CROSS/BLUE SHIELD | Admitting: Occupational Therapy

## 2017-08-23 DIAGNOSIS — I890 Lymphedema, not elsewhere classified: Secondary | ICD-10-CM | POA: Diagnosis not present

## 2017-08-23 NOTE — Therapy (Signed)
[place] MAIN [facility] SERVICES [address] [place], [place], [address] Phone: [phone]   Fax:  [phone]  Occupational Therapy Treatment  Patient Details  Name: Madison Palmer MRN: [id number] Date of Birth: [birth year] Referring Provider: Debbora Dus, MD   Encounter Date: 08/23/2017  OT End of Session - 08/23/17 1706    Visit Number  32    Number of Visits  36    Date for OT Re-Evaluation  11/18/17    OT Start Time  0115    OT Stop Time  0215    OT Time Calculation (min)  60 min    Activity Tolerance  Patient tolerated treatment well;Patient limited by pain BLE AROM at all joints limited by body habitus. Pt unable to reach feet without excessive physical effort    Behavior During Therapy  [facility] for tasks assessed/performed       Past Medical History:  Diagnosis Date  . Arrhythmia    heart  . Arthritis   . Cardiomyopathy ([place])   . Chickenpox   . Depression   . Diverticulitis   . GERD (gastroesophageal reflux disease)   . Glaucoma   . Headache   . History of PCOS   . Hyperlipidemia   . Hypertension   . Inflammatory polyps of colon ([place])   . Recurrent UTI   . Thyroid disease   . UTI (urinary tract infection)     Past Surgical History:  Procedure Laterality Date  . BREAST BIOPSY  2015  . CESAREAN SECTION  2004  . INNER EAR SURGERY     ear and sinus surgery  . LAPAROSCOPIC REPAIR AND REMOVAL OF GASTRIC BAND    . OOPHORECTOMY Left   . TONSILLECTOMY AND ADENOIDECTOMY      There were no vitals filed for this visit.  Subjective Assessment - 08/23/17 1332    Subjective   Pt 15 minutes late for 60 minute appointment. Pt presents for OT visit 32/ 36 . Pt brings new custom compression capri pantyhose to clinic. Pt reports she wore garment after session earlier this week. Garments tended to slide down making crotch slide down to  mid thigh. Pt washed garment but has not worn it again since our last session  (Pended)     Pertinent History  onset BLE LE 2004 s/p C section, essential HTN, GERD, Hypothyroidism, Obesity; OSA ( compliant w/ C-Pap) Lap Band sx s/p 2009 Lap banc removed 2013, OA  (Pended)     Limitations  limited hip AROM- unable to reach feet and distal legs to don socks, compression garments, lace up street shoews, decreased standing and walking tolerance 2/2 leg swelling and associated pain,   (Pended)     Special Tests  Initial Lymphedema Life Impact Scale [facility]) score measures extent of impairment 2/2 lymphedema " in the pst week" at 49%.   (Pended)     Patient Stated Goals  decrease leg edema and get it under better control; explore alternative compression garments/ devices  (Pended)     Pain Onset  --  (Pended)  chronic                   OT Treatments/Exercises (OP) - 08/23/17 0001      ADLs   ADL Education Given  Yes      Manual Therapy   Manual Therapy  Edema management    Edema Management  anatomical measurements for custom compression garment remakes  OT Education - 08/23/17 1706    Education provided  Yes    Education Details  Continued skilled Pt/caregiver education  And LE ADL training throughout visit for lymphedema self care/ home program, including compression wrapping, compression garment and device wear/care, lymphatic pumping ther ex, simple self-MLD, and skin care. Discussed progress towards goals.     Person(s) Educated  Patient    Methods  Explanation;Demonstration    Comprehension  Verbalized understanding;Returned demonstration;Tactile cues required;Need further instruction          OT Long Term Goals - 08/21/17 1220      OT LONG TERM GOAL #1   Title  Pt modified independent w/ lymphedema precautions and prevention principals and strategies using printed reference to limit LE progression and infection risk.    Baseline  Max A    Time  2    Period  Weeks    Status  Achieved      OT LONG TERM GOAL #2   Title  Lymphedema (LE)  management/ self-care: Pt able to apply knee length, multi layered, gradient compression wraps with minimal assistance using proper techniques within 2 weeks to achieve optimal limb volume reductions in preparation for fitting compression garments/ devices bilaterally.    Baseline  Max A.  08/16/17 Pt able to apply compression wraps with extra time and supported positioning    Time  2    Period  Weeks    Status  Achieved      OT LONG TERM GOAL #3   Title  Lymphedema (LE) management/ self-care:  Pt to achieve at least 10%  BLE limb volume reductions  during Intensive Phase CDT to limit LE progression, to reduce pain, to improve ADLs performance.    Baseline  Max A 08/16/17:m Pt able to perform neck sequence and deep breathing, but unable to reach below knees to perform distal self MLD, so she has obtained a Flexitouch sequential pneumatic compression device to assist w/ daily lymphatic decongestion on alternating legs. Pt has yet to use device at home because she needs assistance from family members to don and doff pump garments.    Time  12    Period  Weeks    Status  Partially Met      OT LONG TERM GOAL #4   Title  Lymphedema (LE) management/ self-care:  Pt to tolerate daily compression wraps, compression garments and/ or HOS devices in keeping w/ prescribed wear regime within 1 week of issue date of each to progress and retain clinical and functional gains and to limit LE progression.    Baseline  Max A    Time  12    Period  Weeks    Status  Achieved      OT LONG TERM GOAL #5   Title  Lymphedema (LE) self-management/ self-care:  During Management Phase CDT Pt to sustain current limb volumes within 5% using LE self-care skills and strategies learned during Intensive Phase OT independently to control limb swelling and associated pain, to  limit LE progression, to decrease infection risk and to limit further functional decline.    Baseline  Max A .     Time  6    Period  Months    Status   On-going            Plan - 08/23/17 1707    Clinical Impression Statement  Emphasis of visit on Pt edu for donning compression garment using assistive devices and crrect techniques for optimal fit.  Pt required Max A and extra time to don garment. Completed additional anatomical measurements to ladd length to top edge at G and at posterior k2T length to increase panty portion height in back. Took  photos t include with request for manufacturer tech support next week. Pt understands remake process and gave verbal permission to share photos. Cont as per POC.    Occupational performance deficits (Please refer to evaluation for details):  ADL's;IADL's;Work;Leisure;Social Participation;Other body image    Rehab Potential  Good    OT Frequency  3x / week    OT Duration  12 weeks    OT Treatment/Interventions  Self-care/ADL training;DME and/or AE instruction;Manual Therapy;Patient/family education;Manual lymph drainage;Therapeutic exercise;Compression bandaging;Therapeutic activities;Other (comment) skin care w/ low ph castor oil and/ or lotion during MLD    Recommended Other Services  fit with appropriate BLE compression garments, HOSD devices that are comfortable and Pt is able to don and doff using assistive devices PRN    Consulted and Agree with Plan of Care  Patient       Patient will benefit from skilled therapeutic intervention in order to improve the following deficits and impairments:  Decreased knowledge of use of DME, Decreased skin integrity, Increased edema, Impaired flexibility, Pain, Decreased mobility, Decreased activity tolerance, Decreased range of motion, Decreased knowledge of precautions, Difficulty walking, Obesity  Visit Diagnosis: Lymphedema, not elsewhere classified    Problem List Patient Active Problem List   Diagnosis Date Noted  . Multinodular goiter 07/27/2017  . Elevated parathyroid hormone 06/13/2017  . Vitamin D deficiency 06/13/2017  . Hx of colonic polyps  05/15/2017  . Eustachian tube dysfunction, right 05/15/2017  . Lymphatic edema 05/15/2017  . Hypertension 05/14/2017  . Anemia 05/11/2017  . Arrhythmia 05/11/2017  . Diverticulosis 05/11/2017  . GERD (gastroesophageal reflux disease) 05/11/2017  . Hypothyroidism 05/11/2017  . Lower extremity edema 05/11/2017  . Morbid obesity ([place]) 05/11/2017  . Polycystic ovary syndrome 05/11/2017  . OSA on CPAP 05/11/2017  . Neck pain 05/11/2017  . Allergic rhinitis 05/11/2017  . 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

## 2017-08-27 ENCOUNTER — Ambulatory Visit: Payer: BLUE CROSS/BLUE SHIELD | Admitting: Occupational Therapy

## 2017-08-27 DIAGNOSIS — I890 Lymphedema, not elsewhere classified: Secondary | ICD-10-CM | POA: Diagnosis not present

## 2017-08-27 NOTE — Therapy (Signed)
[place] MAIN [facility] SERVICES [address] [place], [place], [address] Phone: [phone]   Fax:  [phone]  Occupational Therapy Treatment  Patient Details  Name: Madison Palmer MRN: [id number] Date of Birth: 1965-06-21 Referring Provider: Debbora Dus, MD   Encounter Date: 08/27/2017  OT End of Session - 08/27/17 1729    Visit Number  33    Number of Visits  36    Date for OT Re-Evaluation  11/18/17    OT Start Time  0110    OT Stop Time  0205    OT Time Calculation (min)  55 min    Activity Tolerance  Patient tolerated treatment well;Patient limited by pain BLE AROM at all joints limited by body habitus. Pt unable to reach feet without excessive physical effort    Behavior During Therapy  [facility] for tasks assessed/performed       Past Medical History:  Diagnosis Date  . Arrhythmia    heart  . Arthritis   . Cardiomyopathy ([place])   . Chickenpox   . Depression   . Diverticulitis   . GERD (gastroesophageal reflux disease)   . Glaucoma   . Headache   . History of PCOS   . Hyperlipidemia   . Hypertension   . Inflammatory polyps of colon ([place])   . Recurrent UTI   . Thyroid disease   . UTI (urinary tract infection)     Past Surgical History:  Procedure Laterality Date  . BREAST BIOPSY  2015  . CESAREAN SECTION  2004  . INNER EAR SURGERY     ear and sinus surgery  . LAPAROSCOPIC REPAIR AND REMOVAL OF GASTRIC BAND    . OOPHORECTOMY Left   . TONSILLECTOMY AND ADENOIDECTOMY      There were no vitals filed for this visit.  Subjective Assessment - 08/27/17 1726    Subjective   Pt 10 minutes late for 60 minute appointment. Pt presents for OT visit 33/ 36 . Pt brings new custom compression capri pantyhose to clinic. Pt reports she has used pump again , but has not yet called the technician from manufacturer to discuss fitting issues.      Pertinent History  onset BLE LE 2004 s/p C section, essential HTN, GERD,  Hypothyroidism, Obesity; OSA ( compliant w/ C-Pap) Lap Band sx s/p 2009 Lap banc removed 2013, OA    Limitations  limited hip AROM- unable to reach feet and distal legs to don socks, compression garments, lace up street shoews, decreased standing and walking tolerance 2/2 leg swelling and associated pain,     Special Tests  Initial Lymphedema Life Impact Scale [facility]) score measures extent of impairment 2/2 lymphedema " in the pst week" at 49%.     Patient Stated Goals  decrease leg edema and get it under better control; explore alternative compression garments/ devices    Currently in Pain?  No/denies    Pain Onset  -- chronic                   OT Treatments/Exercises (OP) - 08/27/17 0001      ADLs   ADL Education Given  Yes      Manual Therapy   Manual Therapy  Edema management    Manual therapy comments  skin care to BLE during MLD    Manual Lymphatic Drainage (MLD)  MLD to LLE today as established. Emphasis on Proximal R thigh    Compression Bandaging  Pt donned  knee highs after session with modified independence.             OT Education - 08/27/17 1728    Education provided  Yes    Education Details  Continued skilled Pt/caregiver education  And LE ADL training throughout visit for lymphedema self care/ home program, including compression wrapping, compression garment and device wear/care, lymphatic pumping ther ex, simple self-MLD, and skin care. Discussed progress towards goals.     Person(s) Educated  Patient    Methods  Explanation;Demonstration;Tactile cues;Verbal cues    Comprehension  Verbalized understanding;Returned demonstration          OT Long Term Goals - 08/21/17 1220      OT LONG TERM GOAL #1   Title  Pt modified independent w/ lymphedema precautions and prevention principals and strategies using printed reference to limit LE progression and infection risk.    Baseline  Max A    Time  2    Period  Weeks    Status  Achieved      OT LONG  TERM GOAL #2   Title  Lymphedema (LE) management/ self-care: Pt able to apply knee length, multi layered, gradient compression wraps with minimal assistance using proper techniques within 2 weeks to achieve optimal limb volume reductions in preparation for fitting compression garments/ devices bilaterally.    Baseline  Max A.  08/16/17 Pt able to apply compression wraps with extra time and supported positioning    Time  2    Period  Weeks    Status  Achieved      OT LONG TERM GOAL #3   Title  Lymphedema (LE) management/ self-care:  Pt to achieve at least 10%  BLE limb volume reductions  during Intensive Phase CDT to limit LE progression, to reduce pain, to improve ADLs performance.    Baseline  Max A 08/16/17:m Pt able to perform neck sequence and deep breathing, but unable to reach below knees to perform distal self MLD, so she has obtained a Flexitouch sequential pneumatic compression device to assist w/ daily lymphatic decongestion on alternating legs. Pt has yet to use device at home because she needs assistance from family members to don and doff pump garments.    Time  12    Period  Weeks    Status  Partially Met      OT LONG TERM GOAL #4   Title  Lymphedema (LE) management/ self-care:  Pt to tolerate daily compression wraps, compression garments and/ or HOS devices in keeping w/ prescribed wear regime within 1 week of issue date of each to progress and retain clinical and functional gains and to limit LE progression.    Baseline  Max A    Time  12    Period  Weeks    Status  Achieved      OT LONG TERM GOAL #5   Title  Lymphedema (LE) self-management/ self-care:  During Management Phase CDT Pt to sustain current limb volumes within 5% using LE self-care skills and strategies learned during Intensive Phase OT independently to control limb swelling and associated pain, to  limit LE progression, to decrease infection risk and to limit further functional decline.    Baseline  Max A .     Time   6    Period  Months    Status  On-going            Plan - 08/27/17 1729    Clinical Impression Statement  Pt 10  minutes late for 60 minute session. Pt tolerated MLD and skin care to LLE. Pt able to don     BLE knee length compression garments with modified independence. Pt in agreement w/ plan to bring Flexitouch garment to session next week to troubleshoot donning and doffing difficulties, and to address possible modifications to enable Pt to don and doff more independently. Pt agrees with plan to decrease OT frequency to 1 x weekly until custom garment remake is fitted as she transitions  into self-management phase of CDT.    Occupational performance deficits (Please refer to evaluation for details):  ADL's;IADL's;Work;Leisure;Social Participation;Other body image    Rehab Potential  Good    OT Frequency  3x / week    OT Duration  12 weeks    OT Treatment/Interventions  Self-care/ADL training;DME and/or AE instruction;Manual Therapy;Patient/family education;Manual lymph drainage;Therapeutic exercise;Compression bandaging;Therapeutic activities;Other (comment) skin care w/ low ph castor oil and/ or lotion during MLD    Recommended Other Services  fit with appropriate BLE compression garments, HOSD devices that are comfortable and Pt is able to don and doff using assistive devices PRN    Consulted and Agree with Plan of Care  Patient       Patient will benefit from skilled therapeutic intervention in order to improve the following deficits and impairments:  Decreased knowledge of use of DME, Decreased skin integrity, Increased edema, Impaired flexibility, Pain, Decreased mobility, Decreased activity tolerance, Decreased range of motion, Decreased knowledge of precautions, Difficulty walking, Obesity  Visit Diagnosis: Lymphedema, not elsewhere classified    Problem List Patient Active Problem List   Diagnosis Date Noted  . Multinodular goiter 07/27/2017  . Elevated parathyroid  hormone 06/13/2017  . Vitamin D deficiency 06/13/2017  . Hx of colonic polyps 05/15/2017  . Eustachian tube dysfunction, right 05/15/2017  . Lymphatic edema 05/15/2017  . Hypertension 05/14/2017  . Anemia 05/11/2017  . Arrhythmia 05/11/2017  . Diverticulosis 05/11/2017  . GERD (gastroesophageal reflux disease) 05/11/2017  . Hypothyroidism 05/11/2017  . Lower extremity edema 05/11/2017  . Morbid obesity ([place]) 05/11/2017  . Polycystic ovary syndrome 05/11/2017  . OSA on CPAP 05/11/2017  . Neck pain 05/11/2017  . Allergic rhinitis 05/11/2017  . 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

## 2017-08-28 ENCOUNTER — Encounter: Payer: BLUE CROSS/BLUE SHIELD | Admitting: Occupational Therapy

## 2017-08-30 ENCOUNTER — Ambulatory Visit: Payer: BLUE CROSS/BLUE SHIELD | Admitting: Occupational Therapy

## 2017-09-04 ENCOUNTER — Ambulatory Visit: Payer: BLUE CROSS/BLUE SHIELD | Attending: Nurse Practitioner | Admitting: Occupational Therapy

## 2017-09-04 DIAGNOSIS — I890 Lymphedema, not elsewhere classified: Secondary | ICD-10-CM

## 2017-09-04 NOTE — Therapy (Signed)
[place] MAIN [facility] SERVICES [address] [place], [place], [address] Phone: [phone]   Fax:  [phone]  Occupational Therapy Treatment  Patient Details  Name: Madison Palmer MRN: [id number] Date of Birth: 04/23/1966 Referring Provider: Debbora Dus, MD   Encounter Date: 09/04/2017  OT End of Session - 09/04/17 1640    Visit Number  34    Number of Visits  36    Date for OT Re-Evaluation  11/18/17    OT Start Time  0110    OT Stop Time  0200    OT Time Calculation (min)  50 min    Activity Tolerance  Patient tolerated treatment well;Patient limited by pain BLE AROM at all joints limited by body habitus. Pt unable to reach feet without excessive physical effort    Behavior During Therapy  [facility] for tasks assessed/performed       Past Medical History:  Diagnosis Date  . Arrhythmia    heart  . Arthritis   . Cardiomyopathy ([place])   . Chickenpox   . Depression   . Diverticulitis   . GERD (gastroesophageal reflux disease)   . Glaucoma   . Headache   . History of PCOS   . Hyperlipidemia   . Hypertension   . Inflammatory polyps of colon ([place])   . Recurrent UTI   . Thyroid disease   . UTI (urinary tract infection)     Past Surgical History:  Procedure Laterality Date  . BREAST BIOPSY  2015  . CESAREAN SECTION  2004  . INNER EAR SURGERY     ear and sinus surgery  . LAPAROSCOPIC REPAIR AND REMOVAL OF GASTRIC BAND    . OOPHORECTOMY Left   . TONSILLECTOMY AND ADENOIDECTOMY      There were no vitals filed for this visit.  Subjective Assessment - 09/04/17 1319    Subjective   Pt presents for OT visit 34/ 36 . Pt reports she is doing well. Pt reports she has not used compression pump since last visit. Pt reports she had shots in both knees. Pt has not yet received her compression  garment remake.    (Pended)     Pertinent History  onset BLE LE 2004 s/p C section, essential HTN, GERD, Hypothyroidism, Obesity; OSA ( compliant  w/ C-Pap) Lap Band sx s/p 2009 Lap banc removed 2013, OA  (Pended)     Limitations  limited hip AROM- unable to reach feet and distal legs to don socks, compression garments, lace up street shoews, decreased standing and walking tolerance 2/2 leg swelling and associated pain,   (Pended)     Special Tests  Initial Lymphedema Life Impact Scale [facility]) score measures extent of impairment 2/2 lymphedema " in the pst week" at 49%.   (Pended)     Patient Stated Goals  decrease leg edema and get it under better control; explore alternative compression garments/ devices  (Pended)     Currently in Pain?  No/denies  (Pended)     Pain Onset  --  (Pended)  chronic                   OT Treatments/Exercises (OP) - 09/04/17 0001      Manual Therapy   Manual Therapy  Edema management    Edema Management  completed anatomical measurements for  custom, BLE Jobst Relx devices             OT Education - 09/04/17 1315  Education provided  Yes    Education Details  Continued skilled Pt/caregiver education  And LE ADL training throughout visit for lymphedema self care/ home program, including compression wrapping, compression garment and device wear/care, lymphatic pumping ther ex, simple self-MLD, and skin care. Discussed progress towards goals.    Person(s) Educated  Patient    Methods  Explanation;Demonstration    Comprehension  Verbalized understanding;Returned demonstration;Need further instruction          OT Long Term Goals - 08/21/17 1220      OT LONG TERM GOAL #1   Title  Pt modified independent w/ lymphedema precautions and prevention principals and strategies using printed reference to limit LE progression and infection risk.    Baseline  Max A    Time  2    Period  Weeks    Status  Achieved      OT LONG TERM GOAL #2   Title  Lymphedema (LE) management/ self-care: Pt able to apply knee length, multi layered, gradient compression wraps with minimal assistance using proper  techniques within 2 weeks to achieve optimal limb volume reductions in preparation for fitting compression garments/ devices bilaterally.    Baseline  Max A.  08/16/17 Pt able to apply compression wraps with extra time and supported positioning    Time  2    Period  Weeks    Status  Achieved      OT LONG TERM GOAL #3   Title  Lymphedema (LE) management/ self-care:  Pt to achieve at least 10%  BLE limb volume reductions  during Intensive Phase CDT to limit LE progression, to reduce pain, to improve ADLs performance.    Baseline  Max A 08/16/17:m Pt able to perform neck sequence and deep breathing, but unable to reach below knees to perform distal self MLD, so she has obtained a Flexitouch sequential pneumatic compression device to assist w/ daily lymphatic decongestion on alternating legs. Pt has yet to use device at home because she needs assistance from family members to don and doff pump garments.    Time  12    Period  Weeks    Status  Partially Met      OT LONG TERM GOAL #4   Title  Lymphedema (LE) management/ self-care:  Pt to tolerate daily compression wraps, compression garments and/ or HOS devices in keeping w/ prescribed wear regime within 1 week of issue date of each to progress and retain clinical and functional gains and to limit LE progression.    Baseline  Max A    Time  12    Period  Weeks    Status  Achieved      OT LONG TERM GOAL #5   Title  Lymphedema (LE) self-management/ self-care:  During Management Phase CDT Pt to sustain current limb volumes within 5% using LE self-care skills and strategies learned during Intensive Phase OT independently to control limb swelling and associated pain, to  limit LE progression, to decrease infection risk and to limit further functional decline.    Baseline  Max A .     Time  6    Period  Months    Status  On-going            Plan - 09/04/17 1641    Clinical Impression Statement  Pt 10 minutes late for 60 minute OT appointment #  34/36 to address BLE LE. Provided Pt edu reguarding benefits of LE HOS device for reducing fibrosis formation and facilitating  improved lymphatiic function during HOS. Completed anatomical measurements for B Jobst RELAX, knee length , ccl 2. These devices are chosen for thir low profile  limiting falls risk and improved comfort, also for coolness vs larger convoluted foam devices. Pt in agreement with plan to reduce visits to conserve OT while awaiting custom garment remakes and HOS devices.     Occupational performance deficits (Please refer to evaluation for details):  ADL's;IADL's;Work;Leisure;Social Participation;Other body image    Rehab Potential  Good    OT Frequency  3x / week    OT Duration  12 weeks    OT Treatment/Interventions  Self-care/ADL training;DME and/or AE instruction;Manual Therapy;Patient/family education;Manual lymph drainage;Therapeutic exercise;Compression bandaging;Therapeutic activities;Other (comment) skin care w/ low ph castor oil and/ or lotion during MLD    Recommended Other Services  fit with appropriate BLE compression garments, HOSD devices that are comfortable and Pt is able to don and doff using assistive devices PRN    Consulted and Agree with Plan of Care  Patient       Patient will benefit from skilled therapeutic intervention in order to improve the following deficits and impairments:  Decreased knowledge of use of DME, Decreased skin integrity, Increased edema, Impaired flexibility, Pain, Decreased mobility, Decreased activity tolerance, Decreased range of motion, Decreased knowledge of precautions, Difficulty walking, Obesity  Visit Diagnosis: Lymphedema, not elsewhere classified    Problem List Patient Active Problem List   Diagnosis Date Noted  . Multinodular goiter 07/27/2017  . Elevated parathyroid hormone 06/13/2017  . Vitamin D deficiency 06/13/2017  . Hx of colonic polyps 05/15/2017  . Eustachian tube dysfunction, right 05/15/2017  .  Lymphatic edema 05/15/2017  . Hypertension 05/14/2017  . Anemia 05/11/2017  . Arrhythmia 05/11/2017  . Diverticulosis 05/11/2017  . GERD (gastroesophageal reflux disease) 05/11/2017  . Hypothyroidism 05/11/2017  . Lower extremity edema 05/11/2017  . Morbid obesity (HCC) 05/11/2017  . Polycystic ovary syndrome 05/11/2017  . OSA on CPAP 05/11/2017  . Neck pain 05/11/2017  . Allergic rhinitis 05/11/2017  . 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/2012     , MS, OTR/L, CLT-LANA 09/04/17 4:46 PM    [facility] MAIN REHAB SERVICES [address] [place], [place], [address] Phone: [phone]   Fax:  [phone]  Name: Janetta L Staples MRN: [id number] Date of Birth: 07/04/1965 

## 2017-09-05 ENCOUNTER — Encounter: Payer: BLUE CROSS/BLUE SHIELD | Admitting: Physical Therapy

## 2017-09-10 ENCOUNTER — Encounter: Payer: Self-pay | Admitting: Nurse Practitioner

## 2017-09-10 ENCOUNTER — Ambulatory Visit: Payer: BLUE CROSS/BLUE SHIELD | Admitting: Nurse Practitioner

## 2017-09-10 VITALS — BP 160/90 | HR 81 | Temp 98.5°F | Ht 63.0 in | Wt 345.0 lb

## 2017-09-10 DIAGNOSIS — I10 Essential (primary) hypertension: Secondary | ICD-10-CM

## 2017-09-10 DIAGNOSIS — E282 Polycystic ovarian syndrome: Secondary | ICD-10-CM | POA: Diagnosis not present

## 2017-09-10 DIAGNOSIS — E210 Primary hyperparathyroidism: Secondary | ICD-10-CM | POA: Diagnosis not present

## 2017-09-10 DIAGNOSIS — E559 Vitamin D deficiency, unspecified: Secondary | ICD-10-CM | POA: Diagnosis not present

## 2017-09-10 DIAGNOSIS — E782 Mixed hyperlipidemia: Secondary | ICD-10-CM

## 2017-09-10 DIAGNOSIS — Z23 Encounter for immunization: Secondary | ICD-10-CM | POA: Diagnosis not present

## 2017-09-10 DIAGNOSIS — M7989 Other specified soft tissue disorders: Secondary | ICD-10-CM | POA: Diagnosis not present

## 2017-09-10 DIAGNOSIS — M79662 Pain in left lower leg: Secondary | ICD-10-CM | POA: Diagnosis not present

## 2017-09-10 LAB — COMPREHENSIVE METABOLIC PANEL
ALBUMIN: 4 g/dL (ref 3.5–5.2)
ALK PHOS: 87 U/L (ref 39–117)
ALT: 15 U/L (ref 0–35)
AST: 13 U/L (ref 0–37)
BUN: 18 mg/dL (ref 6–23)
CO2: 35 mEq/L — ABNORMAL HIGH (ref 19–32)
Calcium: 10.1 mg/dL (ref 8.4–10.5)
Chloride: 98 mEq/L (ref 96–112)
Creatinine, Ser: 0.77 mg/dL (ref 0.40–1.20)
GFR: 83.86 mL/min (ref >60.00)
Glucose, Bld: 105 mg/dL — ABNORMAL HIGH (ref 70–99)
POTASSIUM: 3.6 meq/L (ref 3.5–5.1)
SODIUM: 140 meq/L (ref 135–145)
Total Bilirubin: 0.9 mg/dL (ref 0.2–1.2)
Total Protein: 7.2 g/dL (ref 6.0–8.3)

## 2017-09-10 LAB — LIPID PANEL
CHOLESTEROL: 216 mg/dL — AB (ref 0–200)
HDL: 46.6 mg/dL (ref >39.00)
LDL Cholesterol: 142 mg/dL — ABNORMAL HIGH (ref 0–99)
NonHDL: 168.9
TRIGLYCERIDES: 133 mg/dL (ref 0.0–149.0)
Total CHOL/HDL Ratio: 5
VLDL: 26.6 mg/dL (ref 0.0–40.0)

## 2017-09-10 LAB — HEMOGLOBIN A1C: Hgb A1c MFr Bld: 5.7 % (ref 4.6–6.5)

## 2017-09-10 LAB — VITAMIN D 25 HYDROXY (VIT D DEFICIENCY, FRACTURES): VITD: 24.97 ng/mL — ABNORMAL LOW (ref 30.00–100.00)

## 2017-09-10 NOTE — Assessment & Plan Note (Addendum)
Uncontrolled Average home BP reading 150s/90s  BP Readings from Last 3 Encounters:  09/10/17 (!) 160/90  07/25/17 (!) 162/110  06/13/17 (!) 162/94   Decline medication change today due to upcoming trips out of state for the next 60days. She will like to wait till upcoming appt in 46months. Advised about daily BP check. She to to return to office sooner or closest medical facility if BP is higher or develops the following symptoms: CP, SOB, headache, dizziness, or CVA symptoms. CMP ordered to day.

## 2017-09-10 NOTE — Patient Instructions (Addendum)
daily BP check. She to return to office sooner or closest medical facility if BP is higher or develops the following symptoms: CP, SOB, headache, dizziness, or CVA symptoms.  With persistent elevated PTH and low vitamin D, I recommend dexa scan to evaluate bone density. Order entered. Need to resume vitamin D and calcium supplement. Stable CMP with mild elevation in glucose. Hgb A1c of 5.7 which indicates an average glucose of 117. No need for metformin at this time. Need to improve diet (low carb/fat). Lipid panel indicates elevated total cholesterol and LDL. This can improve with increase activity and adequate diet. Also start omega fish oil supplement OTC 2g twice a day.

## 2017-09-10 NOTE — Progress Notes (Signed)
Subjective:  Patient ID: Madison Palmer, female    DOB: 07-04-1965  Age: 52 y.o. MRN: [id number]  CC: Follow-up (3 mo f/u/fasting--BP reading/forgot the take BP meds somedays/gaining weight/tdap)   Leg Pain   The incident occurred more than 1 week ago (onset 06/2017). There was no injury mechanism. The pain is present in the left leg. The quality of the pain is described as aching. The pain has been fluctuating since onset. Associated symptoms include an inability to bear weight. Pertinent negatives include no loss of motion, loss of sensation, muscle weakness, numbness or tingling. It is unknown if a foreign body is present. The symptoms are aggravated by palpation, weight bearing and movement. She has tried acetaminophen, NSAIDs and elevation for the symptoms. The treatment provided mild relief.   HTN:  Uncontrolled with atenolol, HCTZ and hydralazine. Admits to skipping midday dose of hydralazine. Has not made any changes to her diet. She is not interested in change of medications at this time due to upcoming trip for the next 60days. Average home BP readings: 150s/90s BP Readings from Last 3 Encounters:  09/10/17 (!) 160/90  07/25/17 (!) 162/110  06/13/17 (!) 162/94   Outpatient Medications Prior to Visit  Medication Sig Dispense Refill  . Acetaminophen (TYLENOL EXTRA STRENGTH PO) Take by mouth. Takes 2 tablets 2-3 times a day.    Marland Kitchen atenolol (TENORMIN) 25 MG tablet TK 1 T PO D  0  . ergocalciferol (VITAMIN D2) 50000 units capsule Take 50,000 Units by mouth once a week.    . esomeprazole (NEXIUM) 40 MG capsule TK ONE C PO  D  1  . fluticasone (FLONASE) 50 MCG/ACT nasal spray Place 2 sprays into both nostrils daily.    . hydrALAZINE (APRESOLINE) 50 MG tablet Take 1 tablet (50 mg total) by mouth 3 (three) times daily. 90 tablet 2  . hydrochlorothiazide (HYDRODIURIL) 50 MG tablet Take 50 mg by mouth daily.  3  . ibuprofen (ADVIL,MOTRIN) 600 MG tablet Take 600 mg by mouth every 6 (six)  hours as needed.    Marland Kitchen levothyroxine (SYNTHROID, LEVOTHROID) 75 MCG tablet TK 1 T PO QD  MONDAY-FRIDAY  0  . ranitidine (ZANTAC) 150 MG tablet Take by mouth.     No facility-administered medications prior to visit.     ROS See HPI  Objective:  BP (!) 160/90   Pulse 81   Temp 98.5 F (36.9 C) (Oral)   Ht 5\' 3"  (1.6 m)   Wt (!) 345 lb (156.5 kg)   SpO2 93%   BMI 61.11 kg/m   BP Readings from Last 3 Encounters:  09/10/17 (!) 160/90  07/25/17 (!) 162/110  06/13/17 (!) 162/94    Wt Readings from Last 3 Encounters:  09/10/17 (!) 345 lb (156.5 kg)  07/25/17 (!) 338 lb (153.3 kg)  06/13/17 (!) 338 lb (153.3 kg)    Physical Exam  Constitutional: She is oriented to person, place, and time. No distress.  Cardiovascular: Normal rate.  Pulmonary/Chest: Effort normal.  Musculoskeletal: She exhibits edema and tenderness.       Left lower leg: She exhibits tenderness and edema. She exhibits no bony tenderness and no deformity.       Legs: Neurological: She is alert and oriented to person, place, and time.  Skin: No erythema.  Vitals reviewed.   Lab Results  Component Value Date   WBC 6.3 05/29/2017   HGB 15.3 05/29/2017   HCT 44 05/29/2017   PLT 257 05/29/2017  GLUCOSE 105 (H) 09/10/2017   CHOL 216 (H) 09/10/2017   TRIG 133.0 09/10/2017   HDL 46.60 09/10/2017   LDLCALC 142 (H) 09/10/2017   ALT 15 09/10/2017   AST 13 09/10/2017   NA 140 09/10/2017   K 3.6 09/10/2017   CL 98 09/10/2017   CREATININE 0.77 09/10/2017   BUN 18 09/10/2017   CO2 35 (H) 09/10/2017   TSH 1.28 05/29/2017   HGBA1C 5.7 09/10/2017    Mr Ankle Left Wo Contrast  Result Date: 06/28/2017 CLINICAL DATA:  Acute left ankle pain. EXAM: MRI OF THE LEFT ANKLE WITHOUT CONTRAST TECHNIQUE: Multiplanar, multisequence MR imaging of the ankle was performed. No intravenous contrast was administered. COMPARISON:  None. FINDINGS: TENDONS Peroneal: Peroneal longus tendon intact. Peroneal brevis intact.  Posteromedial: Moderate tendinosis of the posterior tibial tendon. Flexor hallucis longus tendon intact. Flexor digitorum longus tendon intact. Anterior: Tibialis anterior tendon intact. Extensor hallucis longus tendon intact Extensor digitorum longus tendon intact. Achilles: Mild tendinosis of Achilles tendon without a discrete tear. Enthesopathic changes of the Achilles tendon insertion. Plantar Fascia: Intact. LIGAMENTS Lateral: Anterior talofibular ligament intact. Calcaneofibular ligament intact. Posterior talofibular ligament intact. Anterior and posterior tibiofibular ligaments intact. Medial: Deltoid ligament intact. Spring ligament intact. CARTILAGE Ankle Joint: No ankle joint effusion. Tibiotalar marginal osteophytes. Partial-thickness cartilage loss of the tibiotalar joint with a small focal area of subchondral reactive marrow change along the medial corner of the talar dome. No focal osteochondral lesion. Subtalar Joints/Sinus Tarsi: Normal subtalar joints. No subtalar joint effusion. Normal sinus tarsi. Bones: Incidental note made of a os trigonum. Mild osteoarthritis of the talonavicular joint, calcaneocuboid joint, second and fourth tarsometatarsal joints. No fracture or dislocation. Plantar calcaneal spur. Soft Tissue: No fluid collection or hematoma. 10 x 14 x 10 mm T2 hyperintense multiloculated mass adjacent to the os trigonum which may reflect a ganglion cyst versus hemangioma or neurofibroma. IMPRESSION: 1. Mild-moderate osteoarthritis of the tibiotalar joint. 2. Mild tendinosis of the Achilles tendon without a discrete tear. 3. Moderate tendinosis of posterior tibial tendon. 4. Mild osteoarthritis of the talonavicular joint, calcaneocuboid joint, second TMT joint and fourth TMT joints. Electronically Signed   By: Kathreen Devoid   On: 06/28/2017 08:12    Assessment & Plan:   Madison Palmer was seen today for follow-up.  Diagnoses and all orders for this visit:  Essential hypertension -      Comprehensive metabolic panel  Vitamin D deficiency -     Vitamin D (25 hydroxy) -     DG Bone Density; Future  Hyperparathyroidism, primary ([place]) -     PTH, intact and calcium -     DG Bone Density; Future  Polycystic ovary syndrome -     Comprehensive metabolic panel -     Lipid panel -     Hemoglobin A1c  Mixed hyperlipidemia -     omega-3 fish oil (MAXEPA) 1000 MG CAPS capsule; Take 2 capsules (2,000 mg total) by mouth 2 (two) times daily.  Morbid obesity (HCC) -     Comprehensive metabolic panel -     Lipid panel -     Hemoglobin A1c -     omega-3 fish oil (MAXEPA) 1000 MG CAPS capsule; Take 2 capsules (2,000 mg total) by mouth 2 (two) times daily.  Pain and swelling of left lower leg -     VAS [place] LOWER EXTREMITY VENOUS (DVT); Future  Need for diphtheria-tetanus-pertussis (Tdap) vaccine -     Tdap vaccine greater than or equal to  7yo IM   I am having Madison Palmer start on omega-3 fish oil. I am also having her maintain her atenolol, esomeprazole, levothyroxine, ibuprofen, fluticasone, hydrochlorothiazide, ranitidine, hydrALAZINE, ergocalciferol, and Acetaminophen (TYLENOL EXTRA STRENGTH PO).  Meds ordered this encounter  Medications  . omega-3 fish oil (MAXEPA) 1000 MG CAPS capsule    Sig: Take 2 capsules (2,000 mg total) by mouth 2 (two) times daily.    Dispense:  90 each    Order Specific Question:   Supervising Provider    Answer:   Lucille Passy [[id number]]    Follow-up: Return in about 8 weeks (around 11/05/2017) for HTN and weight loss.  Wilfred Lacy, NP

## 2017-09-11 LAB — PTH, INTACT AND CALCIUM
Calcium: 10 mg/dL (ref 8.6–10.4)
PTH: 176 pg/mL — AB (ref 14–64)

## 2017-09-12 ENCOUNTER — Encounter: Payer: Self-pay | Admitting: Nurse Practitioner

## 2017-09-12 ENCOUNTER — Ambulatory Visit (HOSPITAL_COMMUNITY)
Admission: RE | Admit: 2017-09-12 | Discharge: 2017-09-12 | Disposition: A | Discharge status: Home / Self Care | Payer: BLUE CROSS/BLUE SHIELD | Source: Ambulatory Visit | Attending: Nurse Practitioner | Admitting: Nurse Practitioner

## 2017-09-12 DIAGNOSIS — M7989 Other specified soft tissue disorders: Secondary | ICD-10-CM

## 2017-09-12 DIAGNOSIS — M79662 Pain in left lower leg: Secondary | ICD-10-CM | POA: Insufficient documentation

## 2017-09-12 DIAGNOSIS — E1169 Type 2 diabetes mellitus with other specified complication: Secondary | ICD-10-CM | POA: Insufficient documentation

## 2017-09-12 DIAGNOSIS — E782 Mixed hyperlipidemia: Secondary | ICD-10-CM | POA: Insufficient documentation

## 2017-09-12 MED ORDER — OMEGA-3 FISH OIL 1000 MG PO CAPS: 2.0000 | ORAL_CAPSULE | Freq: Two times a day (BID) | ORAL | Status: DC

## 2017-09-12 NOTE — Assessment & Plan Note (Signed)
Encourage to make changes to diet and increase activity level

## 2017-09-12 NOTE — Assessment & Plan Note (Signed)
No indication of anemia per cbc done 05/2017. Normal ALP, calcium, and albumin. Vitamin D deficiency present. Resume vitamin D and calcium supplement Ordered dexa scan.

## 2017-09-12 NOTE — Assessment & Plan Note (Signed)
hgbA1c of 5.7 Continue to encourage low carb/fat diet and regular exercise. Advised to consider non weight bearing exercise (swimming and chair exercise) due to OA of knees.

## 2017-09-18 ENCOUNTER — Ambulatory Visit: Payer: BLUE CROSS/BLUE SHIELD | Admitting: Occupational Therapy

## 2017-09-18 DIAGNOSIS — I890 Lymphedema, not elsewhere classified: Secondary | ICD-10-CM

## 2017-09-18 NOTE — Therapy (Signed)
[place] MAIN [facility] SERVICES [address] [place], [place], [address] Phone: [phone]   Fax:  [phone]  Occupational Therapy Treatment  Patient Details  Name: Madison Palmer MRN: [id number] Date of Birth: 08-15-1965 Referring Provider: Debbora Dus, MD   Encounter Date: 09/18/2017  OT End of Session - 09/18/17 1536    Visit Number  35    Number of Visits  36    Date for OT Re-Evaluation  11/18/17    OT Start Time  0130    OT Stop Time  0205    OT Time Calculation (min)  35 min    Activity Tolerance  Patient tolerated treatment well;Patient limited by pain BLE AROM at all joints limited by body habitus. Pt unable to reach feet without excessive physical effort    Behavior During Therapy  [facility] for tasks assessed/performed       Past Medical History:  Diagnosis Date  . Arrhythmia    heart  . Arthritis   . Cardiomyopathy ([place])   . Chickenpox   . Depression   . Diverticulitis   . GERD (gastroesophageal reflux disease)   . Glaucoma   . Headache   . History of PCOS   . Hyperlipidemia   . Hypertension   . Inflammatory polyps of colon ([place])   . Recurrent UTI   . Thyroid disease   . UTI (urinary tract infection)     Past Surgical History:  Procedure Laterality Date  . BREAST BIOPSY  2015  . CESAREAN SECTION  2004  . INNER EAR SURGERY     ear and sinus surgery  . LAPAROSCOPIC REPAIR AND REMOVAL OF GASTRIC BAND    . OOPHORECTOMY Left   . TONSILLECTOMY AND ADENOIDECTOMY      There were no vitals filed for this visit.  Subjective Assessment - 09/18/17 1532    Subjective   Pt presents for OT visit 35 36 . Pt brings remade Elvarex custom capri length pantyhose to glinic for fitting. Pt reports HOS devices ordered from same vendor has not arrived. OT emailed vendor and Pt will call. Pt reports she leaves on 2 week  trip tomorrow and will call if problems with compression garment refitted today arrives. Pt is aware that we  have 10 days only to assess fit and function and request remake in compliance with manufacturer's policy.    Pertinent History  onset BLE LE 2004 s/p C section, essential HTN, GERD, Hypothyroidism, Obesity; OSA ( compliant w/ C-Pap) Lap Band sx s/p 2009 Lap banc removed 2013, OA    Limitations  limited hip AROM- unable to reach feet and distal legs to don socks, compression garments, lace up street shoews, decreased standing and walking tolerance 2/2 leg swelling and associated pain,     Special Tests  Initial Lymphedema Life Impact Scale [facility]) score measures extent of impairment 2/2 lymphedema " in the pst week" at 49%.     Patient Stated Goals  decrease leg edema and get it under better control; explore alternative compression garments/ devices    Currently in Pain?  No/denies    Pain Onset  -- chronic                   OT Treatments/Exercises (OP) - 09/18/17 0001      ADLs   ADL Education Given  Yes      Manual Therapy   Manual Therapy  Edema management    Edema Management  completed  fitting for remade custom Elvarex , ccl 2 , capri length pantyhose. Fiot appears appropriate . Pt able to done without device but needs extra time.             OT Education - 09/18/17 1536    Education provided  Yes    Education Details  Skilled LE self care training for wear and care regimes for custom elastic compression garments for full-time daily use issued and fit today. Provided training for donning and doffing garments using assistive devices (friction gloves and Tyvek sock).    Person(s) Educated  Patient    Methods  Explanation;Demonstration;Tactile cues;Verbal cues;Handout    Comprehension  Verbalized understanding;Returned demonstration;Verbal cues required;Tactile cues required          OT Long Term Goals - 08/21/17 1220      OT LONG TERM GOAL #1   Title  Pt modified independent w/ lymphedema precautions and prevention principals and strategies using printed  reference to limit LE progression and infection risk.    Baseline  Max A    Time  2    Period  Weeks    Status  Achieved      OT LONG TERM GOAL #2   Title  Lymphedema (LE) management/ self-care: Pt able to apply knee length, multi layered, gradient compression wraps with minimal assistance using proper techniques within 2 weeks to achieve optimal limb volume reductions in preparation for fitting compression garments/ devices bilaterally.    Baseline  Max A.  08/16/17 Pt able to apply compression wraps with extra time and supported positioning    Time  2    Period  Weeks    Status  Achieved      OT LONG TERM GOAL #3   Title  Lymphedema (LE) management/ self-care:  Pt to achieve at least 10%  BLE limb volume reductions  during Intensive Phase CDT to limit LE progression, to reduce pain, to improve ADLs performance.    Baseline  Max A 08/16/17:m Pt able to perform neck sequence and deep breathing, but unable to reach below knees to perform distal self MLD, so she has obtained a Flexitouch sequential pneumatic compression device to assist w/ daily lymphatic decongestion on alternating legs. Pt has yet to use device at home because she needs assistance from family members to don and doff pump garments.    Time  12    Period  Weeks    Status  Partially Met      OT LONG TERM GOAL #4   Title  Lymphedema (LE) management/ self-care:  Pt to tolerate daily compression wraps, compression garments and/ or HOS devices in keeping w/ prescribed wear regime within 1 week of issue date of each to progress and retain clinical and functional gains and to limit LE progression.    Baseline  Max A    Time  12    Period  Weeks    Status  Achieved      OT LONG TERM GOAL #5   Title  Lymphedema (LE) self-management/ self-care:  During Management Phase CDT Pt to sustain current limb volumes within 5% using LE self-care skills and strategies learned during Intensive Phase OT independently to control limb swelling and  associated pain, to  limit LE progression, to decrease infection risk and to limit further functional decline.    Baseline  Max A .     Time  6    Period  Months    Status  On-going  Plan - 09/18/17 1537    Clinical Impression Statement  Pt 30 minutes late for 60 minute OT appointment # 55/36 to address BLE LE. Completed fittingh of remake custom , ccl 2 Elvarex capri length pantyhose over knee length compression stockings. Pt able to don garment using extra time but without assistive devices. Pt needed Max assist for  final positioning. Garment appears to fit very well. Pt reports comfort in garment.  Pt will wash and wear to determine final fitting. Pt understands that the manufacturer allows ten days to finalize fitting and cnotact them if problems arise. Pt will be traveling for the next 2 weeks and will contact therapist later bnext week with update on fir and function.    Occupational performance deficits (Please refer to evaluation for details):  ADL's;IADL's;Work;Leisure;Social Participation;Other body image    Rehab Potential  Good    OT Frequency  3x / week    OT Duration  12 weeks    OT Treatment/Interventions  Self-care/ADL training;DME and/or AE instruction;Manual Therapy;Patient/family education;Manual lymph drainage;Therapeutic exercise;Compression bandaging;Therapeutic activities;Other (comment) skin care w/ low ph castor oil and/ or lotion during MLD    Recommended Other Services  fit with appropriate BLE compression garments, HOSD devices that are comfortable and Pt is able to don and doff using assistive devices PRN    Consulted and Agree with Plan of Care  Patient       Patient will benefit from skilled therapeutic intervention in order to improve the following deficits and impairments:  Decreased knowledge of use of DME, Decreased skin integrity, Increased edema, Impaired flexibility, Pain, Decreased mobility, Decreased activity tolerance, Decreased range of  motion, Decreased knowledge of precautions, Difficulty walking, Obesity  Visit Diagnosis: Lymphedema, not elsewhere classified    Problem List Patient Active Problem List   Diagnosis Date Noted  . Mixed hyperlipidemia 09/12/2017  . Multinodular goiter 07/27/2017  . Hyperparathyroidism ([place]) 06/13/2017  . Vitamin D deficiency 06/13/2017  . Hx of colonic polyps 05/15/2017  . Eustachian tube dysfunction, right 05/15/2017  . Lymphatic edema 05/15/2017  . Hypertension 05/14/2017  . Anemia 05/11/2017  . Arrhythmia 05/11/2017  . Diverticulosis 05/11/2017  . GERD (gastroesophageal reflux disease) 05/11/2017  . Hypothyroidism 05/11/2017  . Lower extremity edema 05/11/2017  . Morbid obesity ([place]) 05/11/2017  . Polycystic ovary syndrome 05/11/2017  . OSA on CPAP 05/11/2017  . Neck pain 05/11/2017  . Allergic rhinitis 05/11/2017  . 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

## 2017-09-19 ENCOUNTER — Telehealth: Payer: Self-pay

## 2017-09-19 NOTE — Telephone Encounter (Signed)
Copied from [place] [phone]. Topic: General - Other >> Sep 19, 2017 12:01 PM Lennox Solders wrote: Reason for CRM: pt is calling to let pete know she requesting the bone density test and as soon as she gets the result she will bring a result

## 2017-10-01 ENCOUNTER — Ambulatory Visit: Payer: BLUE CROSS/BLUE SHIELD | Admitting: Physical Therapy

## 2017-10-08 ENCOUNTER — Encounter: Payer: Self-pay | Admitting: Nurse Practitioner

## 2017-10-08 NOTE — Progress Notes (Signed)
Abstracted result and sent to scan  

## 2017-11-07 ENCOUNTER — Ambulatory Visit: Payer: BLUE CROSS/BLUE SHIELD | Admitting: Nurse Practitioner

## 2017-11-07 ENCOUNTER — Encounter: Payer: Self-pay | Admitting: Nurse Practitioner

## 2017-11-07 ENCOUNTER — Other Ambulatory Visit: Payer: Self-pay | Admitting: Nurse Practitioner

## 2017-11-07 ENCOUNTER — Encounter: Payer: Self-pay | Admitting: Gastroenterology

## 2017-11-07 VITALS — BP 166/92 | HR 89 | Temp 98.0°F | Ht 63.0 in | Wt 342.0 lb

## 2017-11-07 DIAGNOSIS — E559 Vitamin D deficiency, unspecified: Secondary | ICD-10-CM | POA: Diagnosis not present

## 2017-11-07 DIAGNOSIS — I10 Essential (primary) hypertension: Secondary | ICD-10-CM | POA: Diagnosis not present

## 2017-11-07 DIAGNOSIS — Z1211 Encounter for screening for malignant neoplasm of colon: Secondary | ICD-10-CM | POA: Diagnosis not present

## 2017-11-07 MED ORDER — ERGOCALCIFEROL 1.25 MG (50000 UT) PO CAPS: 50000.0000 [IU] | ORAL_CAPSULE | ORAL | 0 refills | Status: DC

## 2017-11-07 MED ORDER — HYDROCHLOROTHIAZIDE 25 MG PO TABS: 25.0000 mg | ORAL_TABLET | Freq: Every day | ORAL | 0 refills | Status: DC

## 2017-11-07 MED ORDER — HYDRALAZINE HCL 50 MG PO TABS: 50.0000 mg | ORAL_TABLET | Freq: Three times a day (TID) | ORAL | 5 refills | Status: DC

## 2017-11-07 MED ORDER — SPIRONOLACTONE 25 MG PO TABS: 25.0000 mg | ORAL_TABLET | Freq: Every day | ORAL | 0 refills | Status: DC

## 2017-11-07 NOTE — Patient Instructions (Addendum)
Decrease hydrochlorothiazide to 25mg . Start spironolactone 25mg  once a day. Continue hydralazine as prescribed.  Continue to check BP once a day and record.  You can get dexa scan done by [facility] in [place].  Hold calcium supplement till dexa scan in completed.

## 2017-11-07 NOTE — Progress Notes (Signed)
Subjective:  Patient ID: Madison Palmer, female    DOB: Sep 02, 1965  Age: 52 y.o. MRN: [id number]  CC: Hypertension (refill hydralazine, needs colonoscopy referral, question about dexa scan)  HPI   HTN: Uncontrolled with hydralazine, atenolol, and hydralazine. Home BP readings 160s/90s. Has not made changes to diet BP Readings from Last 3 Encounters:  11/07/17 (!) 166/92  09/10/17 (!) 160/90  07/25/17 (!) 162/110    needs vitamin D prescription.   reviewed [place] today  Outpatient Medications Prior to Visit  Medication Sig Dispense Refill  . Acetaminophen (TYLENOL EXTRA STRENGTH PO) Take by mouth. Takes 2 tablets 2-3 times a day.    Marland Kitchen atenolol (TENORMIN) 25 MG tablet TK 1 T PO D  0  . esomeprazole (NEXIUM) 40 MG capsule TK ONE C PO  D  1  . fluticasone (FLONASE) 50 MCG/ACT nasal spray Place 2 sprays into both nostrils daily.    Marland Kitchen ibuprofen (ADVIL,MOTRIN) 600 MG tablet Take 600 mg by mouth every 6 (six) hours as needed.    Marland Kitchen levothyroxine (SYNTHROID, LEVOTHROID) 75 MCG tablet TK 1 T PO QD  MONDAY-FRIDAY  0  . ranitidine (ZANTAC) 150 MG tablet Take by mouth.    . hydrALAZINE (APRESOLINE) 50 MG tablet Take 1 tablet (50 mg total) by mouth 3 (three) times daily. 90 tablet 2  . hydrochlorothiazide (HYDRODIURIL) 50 MG tablet Take 50 mg by mouth daily.  3  . omega-3 fish oil (MAXEPA) 1000 MG CAPS capsule Take 2 capsules (2,000 mg total) by mouth 2 (two) times daily. (Patient not taking: Reported on 11/07/2017) 90 each   . ergocalciferol (VITAMIN D2) 50000 units capsule Take 50,000 Units by mouth once a week.     No facility-administered medications prior to visit.     ROS Review of Systems  Constitutional: Negative for malaise/fatigue.  Respiratory: Negative for shortness of breath.   Cardiovascular: Negative for palpitations.  Gastrointestinal: Negative for abdominal pain, nausea and vomiting.  Genitourinary: Negative.   Musculoskeletal: Negative for myalgias.  Neurological:  Negative for dizziness and headaches.  Psychiatric/Behavioral: Negative for depression. The patient is not nervous/anxious.      Objective:  BP (!) 166/92   Pulse 89   Temp 98 F (36.7 C) (Oral)   Ht 5\' 3"  (1.6 m)   Wt (!) 342 lb (155.1 kg)   SpO2 92%   BMI 60.58 kg/m   BP Readings from Last 3 Encounters:  11/07/17 (!) 166/92  09/10/17 (!) 160/90  07/25/17 (!) 162/110    Wt Readings from Last 3 Encounters:  11/07/17 (!) 342 lb (155.1 kg)  09/10/17 (!) 345 lb (156.5 kg)  07/25/17 (!) 338 lb (153.3 kg)    Physical Exam  Constitutional: She is oriented to person, place, and time. No distress.  Cardiovascular: Normal rate.  Pulmonary/Chest: Effort normal.  Musculoskeletal: She exhibits edema.  Neurological: She is alert and oriented to person, place, and time.  Skin: Skin is warm and dry.  Psychiatric: She has a normal mood and affect. Her behavior is normal.  Vitals reviewed.  Lab Results  Component Value Date   WBC 6.3 05/29/2017   HGB 15.3 05/29/2017   HCT 44 05/29/2017   PLT 257 05/29/2017   GLUCOSE 105 (H) 09/10/2017   CHOL 216 (H) 09/10/2017   TRIG 133.0 09/10/2017   HDL 46.60 09/10/2017   LDLCALC 142 (H) 09/10/2017   ALT 15 09/10/2017   AST 13 09/10/2017   NA 140 09/10/2017   K 3.6 09/10/2017  CL 98 09/10/2017   CREATININE 0.77 09/10/2017   BUN 18 09/10/2017   CO2 35 (H) 09/10/2017   TSH 1.28 05/29/2017   HGBA1C 5.7 09/10/2017    Assessment & Plan:   Larisa was seen today for hypertension.  Diagnoses and all orders for this visit:  Essential hypertension -     hydrochlorothiazide (HYDRODIURIL) 25 MG tablet; Take 1 tablet (25 mg total) by mouth daily. -     spironolactone (ALDACTONE) 25 MG tablet; Take 1 tablet (25 mg total) by mouth daily. -     hydrALAZINE (APRESOLINE) 50 MG tablet; Take 1 tablet (50 mg total) by mouth 3 (three) times daily.  Colon cancer screening -     Ambulatory referral to Gastroenterology  Vitamin D deficiency -      ergocalciferol (VITAMIN D2) 50000 units capsule; Take 1 capsule (50,000 Units total) by mouth once a week.   I have changed [name]'s hydrochlorothiazide and ergocalciferol. I am also having her start on spironolactone. Additionally, I am having her maintain her atenolol, esomeprazole, levothyroxine, ibuprofen, fluticasone, ranitidine, Acetaminophen (TYLENOL EXTRA STRENGTH PO), omega-3 fish oil, and hydrALAZINE.  Meds ordered this encounter  Medications  . hydrochlorothiazide (HYDRODIURIL) 25 MG tablet    Sig: Take 1 tablet (25 mg total) by mouth daily.    Dispense:  30 tablet    Refill:  0    Order Specific Question:   Supervising Provider    Answer:   Lucille Passy [[id number]]  . spironolactone (ALDACTONE) 25 MG tablet    Sig: Take 1 tablet (25 mg total) by mouth daily.    Dispense:  30 tablet    Refill:  0    Order Specific Question:   Supervising Provider    Answer:   Lucille Passy [[id number]]  . hydrALAZINE (APRESOLINE) 50 MG tablet    Sig: Take 1 tablet (50 mg total) by mouth 3 (three) times daily.    Dispense:  90 tablet    Refill:  5    Order Specific Question:   Supervising Provider    Answer:   Lucille Passy [[id number]]  . ergocalciferol (VITAMIN D2) 50000 units capsule    Sig: Take 1 capsule (50,000 Units total) by mouth once a week.    Dispense:  12 capsule    Refill:  0    Order Specific Question:   Supervising Provider    Answer:   Lucille Passy [[id number]]    Follow-up: Return in about 1 month (around 12/05/2017) for HTN (repeat BMP).  Wilfred Lacy, NP

## 2017-11-08 ENCOUNTER — Ambulatory Visit: Payer: BLUE CROSS/BLUE SHIELD | Admitting: Nurse Practitioner

## 2017-11-22 ENCOUNTER — Ambulatory Visit
Admission: RE | Admit: 2017-11-22 | Discharge: 2017-11-22 | Disposition: A | Discharge status: Home / Self Care | Payer: BLUE CROSS/BLUE SHIELD | Source: Ambulatory Visit | Attending: Nurse Practitioner | Admitting: Nurse Practitioner

## 2017-11-22 DIAGNOSIS — E210 Primary hyperparathyroidism: Secondary | ICD-10-CM

## 2017-11-22 DIAGNOSIS — E559 Vitamin D deficiency, unspecified: Secondary | ICD-10-CM

## 2017-11-27 LAB — HM MAMMOGRAPHY

## 2017-12-11 ENCOUNTER — Encounter: Payer: Self-pay | Admitting: Nurse Practitioner

## 2017-12-11 ENCOUNTER — Ambulatory Visit: Payer: BLUE CROSS/BLUE SHIELD | Admitting: Nurse Practitioner

## 2017-12-11 VITALS — BP 170/98 | HR 86 | Temp 98.8°F | Ht 63.0 in | Wt 341.0 lb

## 2017-12-11 DIAGNOSIS — J0100 Acute maxillary sinusitis, unspecified: Secondary | ICD-10-CM

## 2017-12-11 DIAGNOSIS — E559 Vitamin D deficiency, unspecified: Secondary | ICD-10-CM | POA: Diagnosis not present

## 2017-12-11 DIAGNOSIS — E039 Hypothyroidism, unspecified: Secondary | ICD-10-CM

## 2017-12-11 DIAGNOSIS — D500 Iron deficiency anemia secondary to blood loss (chronic): Secondary | ICD-10-CM | POA: Diagnosis not present

## 2017-12-11 DIAGNOSIS — I10 Essential (primary) hypertension: Secondary | ICD-10-CM | POA: Diagnosis not present

## 2017-12-11 DIAGNOSIS — L304 Erythema intertrigo: Secondary | ICD-10-CM

## 2017-12-11 LAB — BASIC METABOLIC PANEL
BUN: 16 mg/dL (ref 6–23)
CHLORIDE: 97 meq/L (ref 96–112)
CO2: 38 meq/L — AB (ref 19–32)
Calcium: 11.1 mg/dL — ABNORMAL HIGH (ref 8.4–10.5)
Creatinine, Ser: 0.94 mg/dL (ref 0.40–1.20)
GFR: 66.55 mL/min (ref >60.00)
GLUCOSE: 105 mg/dL — AB (ref 70–99)
POTASSIUM: 4.2 meq/L (ref 3.5–5.1)
Sodium: 139 mEq/L (ref 135–145)

## 2017-12-11 LAB — IBC PANEL
Iron: 101 ug/dL (ref 42–145)
Saturation Ratios: 23 % (ref 20.0–50.0)
Transferrin: 314 mg/dL (ref 212.0–360.0)

## 2017-12-11 LAB — TSH: TSH: 0.97 u[IU]/mL (ref 0.35–4.50)

## 2017-12-11 LAB — CBC WITH DIFFERENTIAL/PLATELET
BASOS ABS: 0 10*3/uL (ref 0.0–0.1)
BASOS PCT: 0.5 % (ref 0.0–3.0)
Eosinophils Absolute: 0.1 10*3/uL (ref 0.0–0.7)
Eosinophils Relative: 0.8 % (ref 0.0–5.0)
HEMATOCRIT: 45.6 % (ref 36.0–46.0)
Hemoglobin: 15 g/dL (ref 12.0–15.0)
LYMPHS PCT: 23 % (ref 12.0–46.0)
Lymphs Abs: 1.6 10*3/uL (ref 0.7–4.0)
MCHC: 33 g/dL (ref 30.0–36.0)
MCV: 86.1 fl (ref 78.0–100.0)
MONOS PCT: 6.5 % (ref 3.0–12.0)
Monocytes Absolute: 0.4 10*3/uL (ref 0.1–1.0)
NEUTROS ABS: 4.8 10*3/uL (ref 1.4–7.7)
Neutrophils Relative %: 69.2 % (ref 43.0–77.0)
PLATELETS: 321 10*3/uL (ref 150.0–400.0)
RBC: 5.3 Mil/uL — ABNORMAL HIGH (ref 3.87–5.11)
RDW: 15.5 % (ref 11.5–15.5)
WBC: 6.9 10*3/uL (ref 4.0–10.5)

## 2017-12-11 LAB — T4, FREE: Free T4: 1.31 ng/dL (ref 0.60–1.60)

## 2017-12-11 MED ORDER — AZITHROMYCIN 250 MG PO TABS: 250.0000 mg | ORAL_TABLET | Freq: Every day | ORAL | 0 refills | Status: DC

## 2017-12-11 MED ORDER — MICONAZOLE NITRATE 2 % EX POWD: Freq: Two times a day (BID) | CUTANEOUS | 1 refills | Status: DC

## 2017-12-11 MED ORDER — HYDRALAZINE HCL 50 MG PO TABS: ORAL_TABLET | ORAL | 6 refills | Status: DC

## 2017-12-11 MED ORDER — CETIRIZINE HCL 10 MG PO TABS: 10.0000 mg | ORAL_TABLET | Freq: Every day | ORAL | 0 refills | Status: DC

## 2017-12-11 NOTE — Patient Instructions (Addendum)
I will contact [facility] about Bone density.  Please fax copy of mammogram to me.  Change in Hydralazine dose: take 1tab morning and afternoon, and 2tabs at bedtime. Continue BP checks and documentation once a day.  Use powder for abdominal rash.  Azithromycin and zyrtec for acute sinusitis and ear infection.  Go to lab for blood draw. Will instruct about next appt after review of lab results.  If labs are stable, need to make an appt with pulmonology to re evaluate sleep apnea.

## 2017-12-11 NOTE — Progress Notes (Signed)
Subjective:  Patient ID: Madison Palmer, female    DOB: 20-Jun-1965  Age: 52 y.o. MRN: [id number]  CC: Follow-up (1 mo fu/BP at home run about 127/83,155/89 and 134/85. Dexa scan and anemia (hemoglobin)lab work consult?) and Rash (spot on lower abd,burning at times,patient cant see area. )   Rash  This is a new problem. The current episode started in the past 7 days. The problem is unchanged. The affected locations include the abdomen. The rash is characterized by itchiness, redness and pain. It is unknown if there was an exposure to a precipitant. Associated symptoms include congestion, fatigue and rhinorrhea. Pertinent negatives include no anorexia, cough, diarrhea, eye pain, facial edema, fever, joint pain, nail changes, shortness of breath, sore throat or vomiting. Past treatments include antihistamine. The treatment provided no relief. Her past medical history is significant for allergies.   HTN: Current use of HCTZ, spironolactone, atenolol, and hydralazine. home BP 150s-140s/90s-100s. asymptomatic. BP Readings from Last 3 Encounters:  12/11/17 (!) 170/98  11/07/17 (!) 166/92  09/10/17 (!) 160/90   Anemia: Report worsening fatigue over last 80months. Denies any GI bleed, no nausea, no ABD pain.  Hypothyroidism: Current use of levothyroxine 1109mcg. Worsening fatigue over last 34months.    Reviewed past Medical, Social and Family history today.  Outpatient Medications Prior to Visit  Medication Sig Dispense Refill  . Acetaminophen (TYLENOL EXTRA STRENGTH PO) Take by mouth. Takes 2 tablets 2-3 times a day.    Marland Kitchen atenolol (TENORMIN) 25 MG tablet TK 1 T PO D  0  . ergocalciferol (VITAMIN D2) 50000 units capsule Take 1 capsule (50,000 Units total) by mouth once a week. 12 capsule 0  . esomeprazole (NEXIUM) 40 MG capsule TK ONE C PO  D  1  . fluticasone (FLONASE) 50 MCG/ACT nasal spray Place 2 sprays into both nostrils daily.    . hydrochlorothiazide (HYDRODIURIL) 25 MG tablet Take  1 tablet (25 mg total) by mouth daily. 30 tablet 0  . ibuprofen (ADVIL,MOTRIN) 600 MG tablet Take 600 mg by mouth every 6 (six) hours as needed.    Marland Kitchen levothyroxine (SYNTHROID, LEVOTHROID) 75 MCG tablet TK 1 T PO QD  MONDAY-FRIDAY  0  . ranitidine (ZANTAC) 150 MG tablet Take by mouth.    . spironolactone (ALDACTONE) 25 MG tablet Take 1 tablet (25 mg total) by mouth daily. 30 tablet 0  . hydrALAZINE (APRESOLINE) 50 MG tablet Take 1 tablet (50 mg total) by mouth 3 (three) times daily. 90 tablet 5  . omega-3 fish oil (MAXEPA) 1000 MG CAPS capsule Take 2 capsules (2,000 mg total) by mouth 2 (two) times daily. (Patient not taking: Reported on 11/07/2017) 90 each    No facility-administered medications prior to visit.     ROS See HPI  Objective:  BP (!) 170/98   Pulse 86   Temp 98.8 F (37.1 C) (Oral)   Ht 5\' 3"  (1.6 m)   Wt (!) 341 lb (154.7 kg)   SpO2 94%   BMI 60.41 kg/m   BP Readings from Last 3 Encounters:  12/11/17 (!) 170/98  11/07/17 (!) 166/92  09/10/17 (!) 160/90    Wt Readings from Last 3 Encounters:  12/11/17 (!) 341 lb (154.7 kg)  11/07/17 (!) 342 lb (155.1 kg)  09/10/17 (!) 345 lb (156.5 kg)    Physical Exam  Constitutional: She is oriented to person, place, and time.  HENT:  Right Ear: External ear and ear canal normal. No mastoid tenderness. Tympanic membrane is injected,  erythematous and bulging. Tympanic membrane is not perforated. A middle ear effusion is present.  Left Ear: External ear and ear canal normal. Tympanic membrane is not injected, not erythematous and not bulging. A middle ear effusion is present.  Nose: Mucosal edema and rhinorrhea present. Right sinus exhibits maxillary sinus tenderness. Right sinus exhibits no frontal sinus tenderness. Left sinus exhibits maxillary sinus tenderness. Left sinus exhibits no frontal sinus tenderness.  Mouth/Throat: Uvula is midline. Posterior oropharyngeal erythema present. No oropharyngeal exudate.  Neck: Normal  range of motion. Neck supple.  Cardiovascular: Normal rate, regular rhythm and normal heart sounds.  Pulmonary/Chest: Effort normal and breath sounds normal.  Abdominal: Soft. Bowel sounds are normal. There is no tenderness.  Musculoskeletal: She exhibits edema.  Lymphadenopathy:    She has no cervical adenopathy.  Neurological: She is alert and oriented to person, place, and time.  Skin: Rash noted. Rash is macular. There is erythema.     Vitals reviewed.   Lab Results  Component Value Date   WBC 6.3 05/29/2017   HGB 15.3 05/29/2017   HCT 44 05/29/2017   PLT 257 05/29/2017   GLUCOSE 105 (H) 09/10/2017   CHOL 216 (H) 09/10/2017   TRIG 133.0 09/10/2017   HDL 46.60 09/10/2017   LDLCALC 142 (H) 09/10/2017   ALT 15 09/10/2017   AST 13 09/10/2017   NA 140 09/10/2017   K 3.6 09/10/2017   CL 98 09/10/2017   CREATININE 0.77 09/10/2017   BUN 18 09/10/2017   CO2 35 (H) 09/10/2017   TSH 1.28 05/29/2017   HGBA1C 5.7 09/10/2017    Assessment & Plan:   Madison Palmer was seen today for follow-up and rash.  Diagnoses and all orders for this visit:  Essential hypertension -     Basic Metabolic Panel (BMET) -     hydrALAZINE (APRESOLINE) 50 MG tablet; Take 1tab in morning and afternoon, and 2tabs at bedtime  Vitamin D deficiency  Acute non-recurrent maxillary sinusitis -     azithromycin (ZITHROMAX Z-PAK) 250 MG tablet; Take 1 tablet (250 mg total) by mouth daily. Take 2tabs on first day, then 1tab once a day till complete -     cetirizine (ZYRTEC) 10 MG tablet; Take 1 tablet (10 mg total) by mouth at bedtime.  Hypothyroidism, unspecified type -     TSH -     T4, free  Iron deficiency anemia due to chronic blood loss -     CBC w/Diff -     IBC panel  Intertrigo -     miconazole (MICOTIN) 2 % powder; Apply topically 2 (two) times daily. Use for 1week then stop   I have changed Kaidyn L. Turnbaugh's hydrALAZINE. I am also having her start on miconazole, azithromycin, and  cetirizine. Additionally, I am having her maintain her atenolol, esomeprazole, levothyroxine, ibuprofen, fluticasone, ranitidine, Acetaminophen (TYLENOL EXTRA STRENGTH PO), omega-3 fish oil, hydrochlorothiazide, spironolactone, and ergocalciferol.  Meds ordered this encounter  Medications  . hydrALAZINE (APRESOLINE) 50 MG tablet    Sig: Take 1tab in morning and afternoon, and 2tabs at bedtime    Dispense:  120 tablet    Refill:  6    Order Specific Question:   Supervising Provider    Answer:   Clearance Coots E [[id number]]  . miconazole (MICOTIN) 2 % powder    Sig: Apply topically 2 (two) times daily. Use for 1week then stop    Dispense:  70 g    Refill:  1    Order Specific Question:  Supervising Provider    Answer:   Rosemarie Ax [[id number]]  . azithromycin (ZITHROMAX Z-PAK) 250 MG tablet    Sig: Take 1 tablet (250 mg total) by mouth daily. Take 2tabs on first day, then 1tab once a day till complete    Dispense:  6 tablet    Refill:  0    Order Specific Question:   Supervising Provider    Answer:   Clearance Coots E [[id number]]  . cetirizine (ZYRTEC) 10 MG tablet    Sig: Take 1 tablet (10 mg total) by mouth at bedtime.    Dispense:  14 tablet    Refill:  0    Order Specific Question:   Supervising Provider    Answer:   Clearance Coots E [[id number]]    Follow-up: Return if symptoms worsen or fail to improve.  Wilfred Lacy, NP

## 2017-12-11 NOTE — Assessment & Plan Note (Signed)
Persistent elevation of BP. Maintain atenolol, spironolactone, and HCTZ. Reports in past she developed tachycardia with 100mg  of hydralazine TID. Instructed to take 1tab of hydralazine AM and Noon, 2tabs PM. BP Readings from Last 3 Encounters:  12/11/17 (!) 170/98  11/07/17 (!) 166/92  09/10/17 (!) 160/90   Repeat BMP today

## 2017-12-20 ENCOUNTER — Telehealth: Payer: Self-pay | Admitting: Nurse Practitioner

## 2017-12-20 DIAGNOSIS — R252 Cramp and spasm: Secondary | ICD-10-CM

## 2017-12-20 NOTE — Telephone Encounter (Signed)
Spoke with the pt. She stated since she started on Spironolactone she started to have right leg cramps on and off. It is getting worse as of today. She was wondering if [name] wants her to continue take this med? If so she is out of this med.   Pt also would like to when [place] wants to see her for a follow up?

## 2017-12-20 NOTE — Telephone Encounter (Signed)
Copied from [place] [phone]. Topic: Quick Communication - See Telephone Encounter >> Dec 20, 2017  3:21 PM Gardiner Ramus wrote: CRM for notification. See Telephone encounter for: 12/20/17.spironolactone (ALDACTONE) 25 MG tablet [[id number]]  pt states that she has been having leg cramps . Pt would like to know if she should continue to take medication. Pt is due for a refill. pt also states that she is having lower back pain on the right.

## 2017-12-21 ENCOUNTER — Other Ambulatory Visit (INDEPENDENT_AMBULATORY_CARE_PROVIDER_SITE_OTHER): Payer: BLUE CROSS/BLUE SHIELD

## 2017-12-21 DIAGNOSIS — R252 Cramp and spasm: Secondary | ICD-10-CM | POA: Diagnosis not present

## 2017-12-21 NOTE — Addendum Note (Signed)
Addended by: Lynnea Ferrier on: 12/21/2017 04:40 PM   Modules accepted: Orders

## 2017-12-21 NOTE — Telephone Encounter (Signed)
Pt is agree to stop med and stop by the lab department.

## 2017-12-22 LAB — BASIC METABOLIC PANEL
BUN: 17 mg/dL (ref 7–25)
CALCIUM: 10.2 mg/dL (ref 8.6–10.4)
CHLORIDE: 99 mmol/L (ref 98–110)
CO2: 33 mmol/L — ABNORMAL HIGH (ref 20–32)
CREATININE: 0.94 mg/dL (ref 0.50–1.05)
Glucose, Bld: 104 mg/dL — ABNORMAL HIGH (ref 65–99)
POTASSIUM: 4 mmol/L (ref 3.5–5.3)
Sodium: 141 mmol/L (ref 135–146)

## 2017-12-22 LAB — MAGNESIUM: MAGNESIUM: 2 mg/dL (ref 1.5–2.5)

## 2017-12-24 ENCOUNTER — Encounter: Payer: Self-pay | Admitting: Nurse Practitioner

## 2017-12-24 ENCOUNTER — Ambulatory Visit (HOSPITAL_COMMUNITY)
Admission: RE | Admit: 2017-12-24 | Discharge: 2017-12-24 | Disposition: A | Discharge status: Home / Self Care | Payer: BLUE CROSS/BLUE SHIELD | Source: Ambulatory Visit | Attending: Nurse Practitioner | Admitting: Nurse Practitioner

## 2017-12-24 ENCOUNTER — Other Ambulatory Visit: Payer: Self-pay | Admitting: Nurse Practitioner

## 2017-12-24 ENCOUNTER — Ambulatory Visit: Payer: BLUE CROSS/BLUE SHIELD

## 2017-12-24 ENCOUNTER — Ambulatory Visit (INDEPENDENT_AMBULATORY_CARE_PROVIDER_SITE_OTHER): Payer: BLUE CROSS/BLUE SHIELD

## 2017-12-24 ENCOUNTER — Ambulatory Visit: Payer: BLUE CROSS/BLUE SHIELD | Admitting: Nurse Practitioner

## 2017-12-24 VITALS — BP 174/110 | HR 80 | Temp 98.2°F | Ht 63.0 in | Wt 347.4 lb

## 2017-12-24 DIAGNOSIS — M47818 Spondylosis without myelopathy or radiculopathy, sacral and sacrococcygeal region: Secondary | ICD-10-CM | POA: Diagnosis not present

## 2017-12-24 DIAGNOSIS — M25551 Pain in right hip: Secondary | ICD-10-CM

## 2017-12-24 DIAGNOSIS — M1288 Other specific arthropathies, not elsewhere classified, other specified site: Secondary | ICD-10-CM | POA: Insufficient documentation

## 2017-12-24 DIAGNOSIS — I10 Essential (primary) hypertension: Secondary | ICD-10-CM

## 2017-12-24 DIAGNOSIS — M5441 Lumbago with sciatica, right side: Secondary | ICD-10-CM

## 2017-12-24 DIAGNOSIS — M4316 Spondylolisthesis, lumbar region: Secondary | ICD-10-CM | POA: Diagnosis not present

## 2017-12-24 DIAGNOSIS — M5137 Other intervertebral disc degeneration, lumbosacral region: Secondary | ICD-10-CM | POA: Insufficient documentation

## 2017-12-24 DIAGNOSIS — M47898 Other spondylosis, sacral and sacrococcygeal region: Secondary | ICD-10-CM | POA: Diagnosis not present

## 2017-12-24 LAB — POCT URINALYSIS DIPSTICK
Blood, UA: NEGATIVE
Glucose, UA: NEGATIVE
Leukocytes, UA: NEGATIVE
NITRITE UA: NEGATIVE
Protein, UA: POSITIVE — AB
Spec Grav, UA: 1.025 (ref 1.010–1.025)
Urobilinogen, UA: 0.2 E.U./dL
pH, UA: 6 (ref 5.0–8.0)

## 2017-12-24 MED ORDER — HYDROCHLOROTHIAZIDE 25 MG PO TABS: 25.0000 mg | ORAL_TABLET | Freq: Every day | ORAL | 1 refills | Status: DC

## 2017-12-24 MED ORDER — CYCLOBENZAPRINE HCL 10 MG PO TABS: 10.0000 mg | ORAL_TABLET | Freq: Every day | ORAL | 0 refills | Status: DC

## 2017-12-24 MED ORDER — MELOXICAM 7.5 MG PO TABS: 7.5000 mg | ORAL_TABLET | Freq: Every day | ORAL | 0 refills | Status: DC

## 2017-12-24 NOTE — Patient Instructions (Addendum)
Urinalysis does not indicates UTI.  Maintain hydralazine and HCTZ as prescribed Continue to hold spironolactone.  Continue to check BP 3x/week and record. Send BP reading via mychart e-mail in 59month.  Lumbar spine and pelvic x-ray indicates arthritis. Severe in right sacroiliac joint. Entered referral for PT. Weight loss is also necessary. Contact Dr. Leafy Ro with [facility]. Also need to consider ref to ortho if PT does not help.

## 2017-12-24 NOTE — Progress Notes (Signed)
Subjective:  Patient ID: Madison Palmer, female    DOB: 08/24/1965  Age: 52 y.o. MRN: [id number]  CC: Follow-up (pt c/o right lower back pain 7/10, pt states pain does radiate to right lower pelvic area, back pain is affecting sleep at night, pt has been alternating tylenol and ibuprofen gives some relief . pt BP up during OV , pt has not taking her BP meds yet)  Back Pain  This is a new problem. The current episode started 1 to 4 weeks ago. The problem occurs intermittently. The problem has been waxing and waning since onset. The pain is present in the lumbar spine and gluteal. The quality of the pain is described as aching. Radiates to: right groin. The symptoms are aggravated by lying down and sitting. Stiffness is present at night. Associated symptoms include pelvic pain. Pertinent negatives include no abdominal pain, bladder incontinence, bowel incontinence, chest pain, dysuria, fever, headaches, leg pain, numbness, paresis, paresthesias, perianal numbness, tingling, weakness or weight loss. Risk factors include obesity, poor posture and sedentary lifestyle. She has tried NSAIDs and heat ( and tylenol) for the symptoms. The treatment provided mild relief.  no change in GI/GU function. Right lower back pain, radiates to right anterior groin, relief with heat and movement. Worse with laying down. No improvement with ibuprofen nd tylenol Hx of uterine fibroids. LMP over 82months ago. Not sexually active.  HTN: Current use of Hydralazine 1tab TID and HCTZ. Stopped spironolactone due to muscle cramps, now resolved BP at home 120s-140s/80s-90s (wrist measurement). BP Readings from Last 3 Encounters:  12/24/17 (!) 174/110  12/11/17 (!) 170/98  11/07/17 (!) 166/92   Reviewed past Medical, Social and Family history today.  Outpatient Medications Prior to Visit  Medication Sig Dispense Refill  . Acetaminophen (TYLENOL EXTRA STRENGTH PO) Take by mouth. Takes 2 tablets 2-3 times a day.    Marland Kitchen  atenolol (TENORMIN) 25 MG tablet TK 1 T PO D  0  . cetirizine (ZYRTEC) 10 MG tablet Take 1 tablet (10 mg total) by mouth at bedtime. 14 tablet 0  . ergocalciferol (VITAMIN D2) 50000 units capsule Take 1 capsule (50,000 Units total) by mouth once a week. 12 capsule 0  . esomeprazole (NEXIUM) 40 MG capsule TK ONE C PO  D  1  . fluticasone (FLONASE) 50 MCG/ACT nasal spray Place 2 sprays into both nostrils daily.    . hydrALAZINE (APRESOLINE) 50 MG tablet Take 1tab in morning and afternoon, and 2tabs at bedtime 120 tablet 6  . ibuprofen (ADVIL,MOTRIN) 600 MG tablet Take 600 mg by mouth every 6 (six) hours as needed.    Marland Kitchen levothyroxine (SYNTHROID, LEVOTHROID) 75 MCG tablet TK 1 T PO QD  MONDAY-FRIDAY  0  . ranitidine (ZANTAC) 150 MG tablet Take by mouth.    . hydrochlorothiazide (HYDRODIURIL) 25 MG tablet Take 1 tablet (25 mg total) by mouth daily. 30 tablet 0  . omega-3 fish oil (MAXEPA) 1000 MG CAPS capsule Take 2 capsules (2,000 mg total) by mouth 2 (two) times daily. (Patient not taking: Reported on 11/07/2017) 90 each   . spironolactone (ALDACTONE) 25 MG tablet Take 1 tablet (25 mg total) by mouth daily. (Patient not taking: Reported on 12/24/2017) 30 tablet 0  . azithromycin (ZITHROMAX Z-PAK) 250 MG tablet Take 1 tablet (250 mg total) by mouth daily. Take 2tabs on first day, then 1tab once a day till complete (Patient not taking: Reported on 12/24/2017) 6 tablet 0  . miconazole (MICOTIN) 2 % powder Apply  topically 2 (two) times daily. Use for 1week then stop (Patient not taking: Reported on 12/24/2017) 70 g 1   No facility-administered medications prior to visit.     ROS See HPI  Objective:  BP (!) 174/110 (BP Location: Left Arm, Patient Position: Sitting, Cuff Size: Large)   Pulse 80   Temp 98.2 F (36.8 C) (Oral)   Ht 5\' 3"  (1.6 m)   Wt (!) 347 lb 6.4 oz (157.6 kg)   LMP 05/02/2017 (Within Weeks)   BMI 61.54 kg/m   BP Readings from Last 3 Encounters:  12/24/17 (!) 174/110  12/11/17  (!) 170/98  11/07/17 (!) 166/92    Wt Readings from Last 3 Encounters:  12/24/17 (!) 347 lb 6.4 oz (157.6 kg)  12/11/17 (!) 341 lb (154.7 kg)  11/07/17 (!) 342 lb (155.1 kg)    Physical Exam  Constitutional: She is oriented to person, place, and time. No distress.  Cardiovascular: Normal rate.  Pulmonary/Chest: Effort normal.  Musculoskeletal: She exhibits no deformity.       Right hip: She exhibits decreased range of motion and tenderness. She exhibits normal strength and no bony tenderness.       Left hip: Normal.       Lumbar back: She exhibits no bony tenderness.       Right upper leg: Normal.  No right inguinal lymphadenopathy. Right paraspinal muscle tenderness with knee flexion and external hip rotation  Neurological: She is alert and oriented to person, place, and time.  Vitals reviewed.   Lab Results  Component Value Date   WBC 6.9 12/11/2017   HGB 15.0 12/11/2017   HCT 45.6 12/11/2017   PLT 321.0 12/11/2017   GLUCOSE 104 (H) 12/21/2017   CHOL 216 (H) 09/10/2017   TRIG 133.0 09/10/2017   HDL 46.60 09/10/2017   LDLCALC 142 (H) 09/10/2017   ALT 15 09/10/2017   AST 13 09/10/2017   NA 141 12/21/2017   K 4.0 12/21/2017   CL 99 12/21/2017   CREATININE 0.94 12/21/2017   BUN 17 12/21/2017   CO2 33 (H) 12/21/2017   TSH 0.97 12/11/2017   HGBA1C 5.7 09/10/2017     Assessment & Plan:   Shaletta was seen today for follow-up.  Diagnoses and all orders for this visit:  Acute right-sided low back pain with right-sided sciatica -     POCT urinalysis dipstick -     DG Lumbar Spine Complete -     DG HIPS BILAT WITH PELVIS 3-4 VIEWS -     meloxicam (MOBIC) 7.5 MG tablet; Take 1 tablet (7.5 mg total) by mouth daily. With food -     cyclobenzaprine (FLEXERIL) 10 MG tablet; Take 1 tablet (10 mg total) by mouth at bedtime. -     Cancel: DG Lumbar Spine Complete; Future -     DG Lumbar Spine Complete; Future  Pelvic joint pain, right -     DG Lumbar Spine Complete -      DG HIPS BILAT WITH PELVIS 3-4 VIEWS -     meloxicam (MOBIC) 7.5 MG tablet; Take 1 tablet (7.5 mg total) by mouth daily. With food -     cyclobenzaprine (FLEXERIL) 10 MG tablet; Take 1 tablet (10 mg total) by mouth at bedtime. -     Cancel: DG Lumbar Spine Complete; Future -     DG Lumbar Spine Complete; Future  Essential hypertension -     hydrochlorothiazide (HYDRODIURIL) 25 MG tablet; Take 1 tablet (25 mg total) by  mouth daily.   I have discontinued Nereyda L. Husser's miconazole and azithromycin. I am also having her start on meloxicam and cyclobenzaprine. Additionally, I am having her maintain her atenolol, esomeprazole, levothyroxine, ibuprofen, fluticasone, ranitidine, Acetaminophen (TYLENOL EXTRA STRENGTH PO), omega-3 fish oil, spironolactone, ergocalciferol, hydrALAZINE, cetirizine, and hydrochlorothiazide.  Meds ordered this encounter  Medications  . meloxicam (MOBIC) 7.5 MG tablet    Sig: Take 1 tablet (7.5 mg total) by mouth daily. With food    Dispense:  30 tablet    Refill:  0    Order Specific Question:   Supervising Provider    Answer:   Lucille Passy [[id number]]  . cyclobenzaprine (FLEXERIL) 10 MG tablet    Sig: Take 1 tablet (10 mg total) by mouth at bedtime.    Dispense:  14 tablet    Refill:  0    Order Specific Question:   Supervising Provider    Answer:   Lucille Passy [[id number]]  . hydrochlorothiazide (HYDRODIURIL) 25 MG tablet    Sig: Take 1 tablet (25 mg total) by mouth daily.    Dispense:  90 tablet    Refill:  1    Order Specific Question:   Supervising Provider    Answer:   Lucille Passy [[id number]]    Follow-up: Return in about 3 months (around 03/26/2018) for HTN and hypothyroidism.  Wilfred Lacy, NP

## 2017-12-24 NOTE — Assessment & Plan Note (Signed)
Current use of Hydralazine 1tab TID and HCTZ. Stopped spironolactone due to muscle cramps, now resolved BP at home 120s-140s/80s-90s (wrist measurement). BP Readings from Last 3 Encounters:  12/24/17 (!) 174/110  12/11/17 (!) 170/98  11/07/17 (!) 166/92   Advised to take hydralazine and HCTZ as prescribed. F/up with BP readings in 60month via [employer] OV in 20months

## 2017-12-25 ENCOUNTER — Encounter: Payer: Self-pay | Admitting: Nurse Practitioner

## 2018-01-02 ENCOUNTER — Encounter: Payer: Self-pay | Admitting: Physical Therapy

## 2018-01-02 ENCOUNTER — Other Ambulatory Visit: Payer: Self-pay

## 2018-01-02 ENCOUNTER — Ambulatory Visit: Payer: BLUE CROSS/BLUE SHIELD | Attending: Nurse Practitioner | Admitting: Physical Therapy

## 2018-01-02 DIAGNOSIS — M5441 Lumbago with sciatica, right side: Secondary | ICD-10-CM | POA: Insufficient documentation

## 2018-01-02 DIAGNOSIS — M62830 Muscle spasm of back: Secondary | ICD-10-CM

## 2018-01-02 DIAGNOSIS — I890 Lymphedema, not elsewhere classified: Secondary | ICD-10-CM | POA: Insufficient documentation

## 2018-01-02 NOTE — Therapy (Signed)
[facility] [place], [place], [address] Phone: [phone]   Fax:  [phone]  Physical Therapy Evaluation  Patient Details  Name: Madison Palmer MRN: [id number] Date of Birth: 1966-04-11 Referring Provider: Lorayne Marek   Encounter Date: 01/02/2018  PT End of Session - 01/02/18 1440    Visit Number  1    Date for PT Re-Evaluation  03/04/18    PT Start Time  1400    PT Stop Time  1455    PT Time Calculation (min)  55 min    Activity Tolerance  Patient tolerated treatment well    Behavior During Therapy  [facility] for tasks assessed/performed       Past Medical History:  Diagnosis Date  . Arrhythmia    heart  . Arthritis   . Cardiomyopathy ([place])   . Chickenpox   . Depression   . Diverticulitis   . GERD (gastroesophageal reflux disease)   . Glaucoma   . Headache   . History of PCOS   . Hyperlipidemia   . Hypertension   . Inflammatory polyps of colon ([place])   . Recurrent UTI   . Thyroid disease   . UTI (urinary tract infection)     Past Surgical History:  Procedure Laterality Date  . BREAST BIOPSY  2015  . CESAREAN SECTION  2004  . INNER EAR SURGERY     ear and sinus surgery  . LAPAROSCOPIC REPAIR AND REMOVAL OF GASTRIC BAND    . OOPHORECTOMY Left   . TONSILLECTOMY AND ADENOIDECTOMY      There were no vitals filed for this visit.   Subjective Assessment - 01/02/18 1414    Subjective  Patient reports LBP about 2-3 weeks ago.  She is unsure of a cause.  X-rays showed severe OA of right SI, spondylolisthesis, severe DDD and facet arthropathy of the L5-S1.  She does have issues with LE lymphedema, and knee pain due to OA of the knees    Limitations  Lifting;Standing;Walking;House hold activities    Patient Stated Goals  have less pain    Currently in Pain?  Yes    Pain Score  3     Pain Location  Back    Pain Orientation  Right    Pain Descriptors / Indicators  Aching;Sore    Pain Type  Acute  pain    Pain Radiating Towards  into the right buttock and the right posterior leg    Pain Onset  1 to 4 weeks ago    Pain Frequency  Constant    Aggravating Factors   activity the pain would increase, at the end of the day pain worse.  pain up to 9-9/10    Pain Relieving Factors  heated seats helps, Ibuprofen pain a 3/10    Effect of Pain on Daily Activities  limits mobility, ADL's         [facility] PT Assessment - 01/02/18 0001      Assessment   Medical Diagnosis  LBP with sciatica    Referring Provider  [name]    Onset Date/Surgical Date  12/12/17    Hand Dominance  Right    Prior Therapy  for lymphedema in January      Precautions   Precautions  None      Balance Screen   Has the patient fallen in the past 6 months  No    Has the patient had a decrease in activity level because  of a fear of falling?   No    Is the patient reluctant to leave their home because of a fear of falling?   No      Home Environment   Additional Comments  does housework      Prior Function   Level of Independence  Independent    Vocation  Unemployed    Leisure  no exercise      ROM / Strength   AROM / PROM / Strength  AROM;Strength      AROM   Overall AROM Comments  Lumbar ROM was decreased 50% with some pain in the back and the right buttock      Strength   Overall Strength Comments  4-/5 LE wiht pain in the right SI and buttock area      Flexibility   Soft Tissue Assessment /Muscle Length  --   some tightness in the calf and HS and piriformis     Palpation   Palpation comment  tight in the lumbar paraspinals, tender in the right lumbar and buttock area      Ambulation/Gait   Gait Comments  struggles to walk, uses a SPC, mild antalgic on the right, ankles and feet pronate                Objective measurements completed on examination: See above findings.      [facility] Adult PT Treatment/Exercise - 01/02/18 0001      Modalities   Modalities  Electrical Stimulation;Moist Heat       Moist Heat Therapy   Number Minutes Moist Heat  15 Minutes    Moist Heat Location  Lumbar Spine      Electrical Stimulation   Electrical Stimulation Location  right SI area    Electrical Stimulation Action  IFC    Electrical Stimulation Parameters  sitting    Electrical Stimulation Goals  Pain             PT Education - 01/02/18 1440    Education Details  Wms flexion exercises    Person(s) Educated  Patient    Methods  Explanation;Demonstration;Handout    Comprehension  Verbalized understanding       PT Short Term Goals - 01/02/18 1444      PT SHORT TERM GOAL #1   Title  independent with initial HEP    Time  2    Period  Weeks    Status  New        PT Long Term Goals - 01/02/18 1444      PT LONG TERM GOAL #1   Title  understand proper body mechanics for ADL's    Time  8    Period  Weeks    Status  New      PT LONG TERM GOAL #2   Title  increase lumbar ROM 25%    Time  8    Period  Weeks    Status  New      PT LONG TERM GOAL #3   Title  decrease pain 50%    Time  8    Period  Weeks    Status  New      PT LONG TERM GOAL #4   Title  report able to do her normal housework without increase pain    Time  8    Period  Weeks    Status  New             Plan -  01/02/18 1441    Clinical Impression Statement  Patient reports about 3 weeks of right LBP, and pain in to the right buttock and the leg,  X-rays show severe OA right SI, and facet arthropathy, with DDD and spondylolisthesis.  Has some tightness in the LE's, difficulty walking and doing housework    History and Personal Factors relevant to plan of care:  OA knees, obesity, lymphedema    Clinical Presentation  Stable    Clinical Decision Making  Low    Rehab Potential  Good    PT Frequency  2x / week    PT Duration  8 weeks    PT Treatment/Interventions  ADLs/Self Care Home Management;Cryotherapy;Electrical Stimulation;Moist Heat;Functional mobility training;Therapeutic  activities;Therapeutic exercise;Balance training;Neuromuscular re-education;Manual techniques;Patient/family education    PT Next Visit Plan  slowly add exercises and body mechanics instruction    Consulted and Agree with Plan of Care  Patient       Patient will benefit from skilled therapeutic intervention in order to improve the following deficits and impairments:  Abnormal gait, Decreased range of motion, Difficulty walking, Increased muscle spasms, Pain, Impaired flexibility, Improper body mechanics, Decreased strength, Decreased mobility  Visit Diagnosis: Acute right-sided low back pain with right-sided sciatica - Plan: PT plan of care cert/re-cert  Muscle spasm of back - Plan: PT plan of care cert/re-cert     Problem List Patient Active Problem List   Diagnosis Date Noted  . Mixed hyperlipidemia 09/12/2017  . Multinodular goiter 07/27/2017  . Hyperparathyroidism ([place]) 06/13/2017  . Vitamin D deficiency 06/13/2017  . Hx of colonic polyps 05/15/2017  . Eustachian tube dysfunction, right 05/15/2017  . Lymphatic edema 05/15/2017  . Hypertension 05/14/2017  . Anemia 05/11/2017  . Arrhythmia 05/11/2017  . Diverticulosis 05/11/2017  . GERD (gastroesophageal reflux disease) 05/11/2017  . Hypothyroidism 05/11/2017  . Lower extremity edema 05/11/2017  . Morbid obesity ([place]) 05/11/2017  . Polycystic ovary syndrome 05/11/2017  . OSA on CPAP 05/11/2017  . Neck pain 05/11/2017  . Allergic rhinitis 05/11/2017  . 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

## 2018-01-04 ENCOUNTER — Ambulatory Visit: Payer: BLUE CROSS/BLUE SHIELD | Admitting: Gastroenterology

## 2018-01-04 ENCOUNTER — Encounter: Payer: Self-pay | Admitting: Gastroenterology

## 2018-01-04 VITALS — BP 160/100 | HR 106 | Ht 63.0 in | Wt 349.6 lb

## 2018-01-04 DIAGNOSIS — R1084 Generalized abdominal pain: Secondary | ICD-10-CM | POA: Diagnosis not present

## 2018-01-04 DIAGNOSIS — K449 Diaphragmatic hernia without obstruction or gangrene: Secondary | ICD-10-CM

## 2018-01-04 DIAGNOSIS — K224 Dyskinesia of esophagus: Secondary | ICD-10-CM

## 2018-01-04 DIAGNOSIS — K219 Gastro-esophageal reflux disease without esophagitis: Secondary | ICD-10-CM

## 2018-01-04 DIAGNOSIS — Z86010 Personal history of colonic polyps: Secondary | ICD-10-CM | POA: Diagnosis not present

## 2018-01-04 DIAGNOSIS — K649 Unspecified hemorrhoids: Secondary | ICD-10-CM

## 2018-01-04 MED ORDER — ESOMEPRAZOLE MAGNESIUM 40 MG PO CPDR: 40.0000 mg | DELAYED_RELEASE_CAPSULE | Freq: Two times a day (BID) | ORAL | 0 refills | Status: DC

## 2018-01-04 NOTE — Patient Instructions (Signed)
Normal BMI (Body Mass Index- based on height and weight) is between 23 and 30. Your BMI today is Body mass index is 61.93 kg/m. Madison Palmer Please consider follow up  regarding your BMI with your Primary Care Provider.  We have sent the following medications to your pharmacy for you to pick up at your convenience:  We have scheduled you for a follow up appointment with Dr Rush Landmark on 02/26/18 at 130pm

## 2018-01-04 NOTE — Progress Notes (Signed)
[place] VISIT   Primary Care Provider [name], Charlene Brooke, NP [place] [address] [phone]  Referring Provider [name], Charlene Brooke, NP [address] [place], [place] [address] [phone]  Patient Profile: Madison Palmer is a 52 y.o. female with a pmh significant for Obesity (s/p prior Lap-Band and now off), GERD, MDD, HLD, HTN, Colon Polyps (TAs & Hyperplastic), Thyroid Disease, Diverticulosis, PCOS, reported incomplete LES relaxation (not consistent with E GJ outflow obstruction as not [place] to classification).  The patient presents to the [facility] for an evaluation and management of problem(s) noted below:  Problem List 1. Gastroesophageal reflux disease, esophagitis presence not specified   2. History of colonic polyps   3. Generalized postprandial abdominal pain   4. Hiatal hernia   5. Hemorrhoids, unspecified hemorrhoid type   6. Esophageal dysmotilities     History of Present Illness: This is the patient's first visit to the GI  clinic.  She was unfortunately 15 minutes late for her clinic visit.  She initially comes in for consideration of follow-up surveillance colonoscopy in the setting of prior history of colon polyps.  However she also experiences many GI issues for which she has been followed by GI in [place] over the course the last 10 years.  In regards to the specific question was asked him for her history of colon polyps she underwent a colonoscopy in 2016.  At the time of her colonoscopy she was found to have a 5 mm polyp at the IC valve which was removed with hot snare polypectomy.  A 10 mm polyp was found in the transverse colon which was pedunculated and removed with hot snare polypectomy.  A 5 mm polyp was found in the transverse colon which was also pedunculated and removed with a hot snare polypectomy.  She had internal hemorrhoids as well as diverticulosis in the  sigmoid colon, and descending colon, and ascending colon noted.  Based on the pathology results which we were able to obtain during the course of the visit she was only found to have one adenomatous polyp, one hyperplastic polyp, and a submucosal lipoma (this was the IC valve polyp).  She received a letter that had suggested that she needed to have a 3-year follow-up however based on this pathology report and on current colonoscopy guidelines she would not be due for a repeat/follow-up colonoscopy until 2021.  The patient has been experiencing a mild change in her bowel habits noting softer stools but no overt diarrhea.  She thinks it is due to some of her diet changes.  She has no melena or hematochezia.  She has not too concerned about this change.  The base issue for this patient which she has had for greater than 10 years is an abdominal discomfort in her midepigastrium.  She previously associated this per her report with the region of her laparoscopic band that have been placed for weight loss.  She describes the LAP-BAND being removed in 2013 (we have no record of her surgery having had this placed or removed though she is going to sign a release to try and get that information).  It looks like at the time 2013 and going back through the records that she has been dealing with abdominal pain for many years for which she has seen GI in [place] (she had greater than 100 pages of records were received this morning at the time of her clinic visit so these were reviewed after  she had completed her visit).  She describes a postprandial pressure in her mid chest that occurs for at least 2 to 3 hours.  When this occurs she has difficulty breathing.  She does not do positional changes because she uses a cane to maintain balance so does not lean forward or back on purpose to see if that would alleviate or aggravate her symptoms.  She has been on Carafate in the past.  Is on her acid reducing medicines for her  heartburn/pyrosis.  This is not been helpful for that discomfort.  She is hopeful that an answer can be found for this even though she is been dealing with this for so many years.  In regards to her pyrosis she has had this for many years and is previously been on Dexilant and is now on Nexium.  She takes Zantac at night.  She is never been on Nexium twice daily.  Looking through the records it does suggest that she has had acid related changes and esophagitis noted on her endoscopies as are noted below however not on her most recent endoscopy from 2016 per report.  GI Review of Systems Positive as above including nausea and vomiting (at the time of her significant/severe midepigastric pain) Negative for dysphasia, odynophagia, jaundice, incontinence, melena, hematochezia"}  Review of Systems General: Denies fevers/chills/weight loss HEENT: Denies oral lesions Cardiovascular: Denies chest pain Pulmonary: Shortness of breath is stable for her Gastroenterological: See HPI Genitourinary: Denies hematuria or darkened urine Hematological: Denies easy bruising Dermatological: No jaundice Psychological: Mood is stable Musculoskeletal: Denies new arthralgias   Medications Current Outpatient Medications  Medication Sig Dispense Refill  . Acetaminophen (TYLENOL EXTRA STRENGTH PO) Take by mouth. Takes 2 tablets 2-3 times a day.    Marland Kitchen atenolol (TENORMIN) 25 MG tablet TK 1 T PO D  0  . ergocalciferol (VITAMIN D2) 50000 units capsule Take 1 capsule (50,000 Units total) by mouth once a week. 12 capsule 0  . esomeprazole (NEXIUM) 40 MG capsule TK ONE C PO  D  1  . fluticasone (FLONASE) 50 MCG/ACT nasal spray Place 2 sprays into both nostrils daily.    . hydrALAZINE (APRESOLINE) 50 MG tablet Take 1tab in morning and afternoon, and 2tabs at bedtime 120 tablet 6  . hydrochlorothiazide (HYDRODIURIL) 25 MG tablet Take 1 tablet (25 mg total) by mouth daily. 90 tablet 1  . ibuprofen (ADVIL,MOTRIN) 600 MG tablet  Take 600 mg by mouth every 6 (six) hours as needed.    Marland Kitchen levothyroxine (SYNTHROID, LEVOTHROID) 75 MCG tablet TK 1 T PO QD  MONDAY-FRIDAY  0  . cetirizine (ZYRTEC) 10 MG tablet Take 1 tablet (10 mg total) by mouth at bedtime. (Patient not taking: Reported on 01/04/2018) 14 tablet 0  . cyclobenzaprine (FLEXERIL) 10 MG tablet Take 1 tablet (10 mg total) by mouth at bedtime. (Patient not taking: Reported on 01/04/2018) 14 tablet 0  . esomeprazole (NEXIUM) 40 MG capsule Take 1 capsule (40 mg total) by mouth 2 (two) times daily. 180 capsule 0  . meloxicam (MOBIC) 7.5 MG tablet Take 1 tablet (7.5 mg total) by mouth daily. With food (Patient not taking: Reported on 01/04/2018) 30 tablet 0  . omega-3 fish oil (MAXEPA) 1000 MG CAPS capsule Take 2 capsules (2,000 mg total) by mouth 2 (two) times daily. (Patient not taking: Reported on 01/04/2018) 90 each   . spironolactone (ALDACTONE) 25 MG tablet Take 1 tablet (25 mg total) by mouth daily. (Patient not taking: Reported on 01/04/2018) 30  tablet 0   No current facility-administered medications for this visit.     Allergies Allergies  Allergen Reactions  . Fentanyl Hives  . Levofloxacin Hives  . Midazolam Hives  . Pollen Extract     seasonal seasonal  . Atorvastatin     Muscle pain in legs   . Rosuvastatin     Abdominal pain    Histories Past Medical History:  Diagnosis Date  . Arrhythmia    heart  . Arthritis   . Cardiomyopathy ([place])   . Chickenpox   . Depression   . Diverticulitis   . GERD (gastroesophageal reflux disease)   . Glaucoma   . Headache   . History of PCOS   . Hyperlipidemia   . Hypertension   . Inflammatory polyps of colon ([place])   . Recurrent UTI   . Thyroid disease   . UTI (urinary tract infection)    Past Surgical History:  Procedure Laterality Date  . BREAST BIOPSY  2015  . CESAREAN SECTION  2004  . INNER EAR SURGERY     ear and sinus surgery  . LAPAROSCOPIC REPAIR AND REMOVAL OF GASTRIC BAND    . OOPHORECTOMY Left     . TONSILLECTOMY AND ADENOIDECTOMY     Social History   Socioeconomic History  . Marital status: Married    Spouse name: Not on file  . Number of children: Not on file  . Years of education: Not on file  . Highest education level: Not on file  Occupational History  . Not on file  Social Needs  . Financial resource strain: Not on file  . Food insecurity:    Worry: Not on file    Inability: Not on file  . Transportation needs:    Medical: Not on file    Non-medical: Not on file  Tobacco Use  . Smoking status: Never Smoker  . Smokeless tobacco: Never Used  Substance and Sexual Activity  . Alcohol use: Yes    Comment: social  . Drug use: No  . Sexual activity: Not on file  Lifestyle  . Physical activity:    Days per week: Not on file    Minutes per session: Not on file  . Stress: Not on file  Relationships  . Social connections:    Talks on phone: Not on file    Gets together: Not on file    Attends religious service: Not on file    Active member of club or organization: Not on file    Attends meetings of clubs or organizations: Not on file    Relationship status: Not on file  . Intimate partner violence:    Fear of current or ex partner: Not on file    Emotionally abused: Not on file    Physically abused: Not on file    Forced sexual activity: Not on file  Other Topics Concern  . Not on file  Social History Narrative  . Not on file   Family History  Adopted: Yes   I have reviewed her medical, social, and family history in detail and updated the electronic medical record as necessary.    PHYSICAL EXAMINATION  BP (!) 160/100   Pulse (!) 106   Ht '5\' 3"'  (1.6 m)   Wt (!) 349 lb 9.6 oz (158.6 kg)   SpO2 91%   BMI 61.93 kg/m  GEN: Noted difficulty on being able to ambulate from the chair into and on the exam table having to use cane,  appears older than stated age, appears chronically ill PSYCH: Cooperative, without pressured speech EYE: Conjunctivae pink,  sclerae anicteric ENT: MMM, without oral ulcers NECK: Supple, enlarged neck girth CV: RR without R/Gs  RESP: Decreased breath sounds at the bases bilaterally posteriorly GI: NABS, soft, obese, rounded, NT/ND, without rebound or guarding, due to body habitus unable to appreciate hepatosplenomegaly MSK/EXT: Bilateral lower extremity edema present SKIN: No jaundice NEURO:  Alert & Oriented x 3, no focal deficits   REVIEW OF DATA  I reviewed the following data at the time of this encounter:  GI Procedures and Studies  2016 EGD Normal esophagus with Z line intact.  Small hiatal hernia.  5 mm sessile polyp in gastric body status post removal with cold biopsy forceps.  Duodenum normal. Pathology consistent with hyperplastic polyp with surface erosion, inflammation and reactive changes.  Intestinal metaplasia not identified.  H pylori not identified on gives a stain  2016 colonoscopy Perianal and digital rectal were normal.  Diverticulosis in sigmoid colon, descending colon, ascending colon.  TI appeared normal.  5 mm polyp at the IC valve status post hot snare resection.  A 10 mm polyp in transverse colon status post hot snare resection.  5 mm polyp in the transverse colon, pedunculated, removed with hot snare resection.  Internal hemorrhoids. Pathology consistent with one adenomatous polyp, one hyperplastic polyp, IC valve polyp consistent with submucosal lipoma extending to the lamina propria  2014 upper endoscopy Nonerosive reflux esophagitis.  Hiatal hernia.  Granular appearing gastric mucosa with black pigment status post biopsies.  Normal duodenum Pathology consistent with moderate chronic inflammation of the stomach lining.  Elemental iron and hemosiderin deposition was present.  No H. pylori.  No intestinal metaplasia.  2013 esophageal manometry Impression incomplete relaxation of lower esophageal sphincter which is a nonspecific finding.  Normal peristalsis.  Is not clear that patient  meets criteria for having been evaluated with [place] to classification.  2013 upper endoscopy Grade a esophagitis status post biopsies.  No gross lesions in the stomach.  Normal duodenum. Pathology consistent with mild chronic inflammation with reactive changes and no evidence of Barrett's.  2010 colonoscopy Sigmoid colon diverticulosis.  Internal hemorrhoids.  Ileum that was examined was normal.  2010 upper endoscopy Nonerosive reflux esophagitis.  Hiatal hernia.  Normal stomach.  Normal duodenum.  2005 colonoscopy No colon polyps.  Diverticulosis in the ascending colon/descending colon/sigmoid colon.  Internal hemorrhoids.  Laboratory Studies  Reviewed in EPIC and in sent labs from records  01/2013 LFTs AST 16, ALT 14, alk phos 77, total bilirubin 0.6  Imaging Studies  No relevant studies  Record Review  12/2013 visit to GI Nexium started.  Epigastric pain noted to be chronic was on Carafate and was not clear if this was gallstone versus upper GI related so they proceeded with an upper GI endoscopy.  Ultrasound with findings of a gallstone per report.  They felt that if upper endoscopy was negative that she needs surgical evaluation for cholecystectomy.  12/2012 visit to GI Patient on Dexilant at that point in time.  Epigastric pain on Carafate.  Gallstone was noted.  Report of ultrasound of the liver showing gallstones and fatty liver patient advised to lose weight and diet and exercise.  11/2012 visit to GI Dexilant continued.  Carafate for abdominal pain.  12/2011 visit to GI GERD followed and on Dexilant..  Abdominal pain followed.  Diverticulosis.  2013 barium swallow Patulous GE junction.  2014 right upper quadrant ultrasound Fatty liver.  Gallstone  in gallbladder.  2013 CT abdomen pelvis 7.7 x 7.4 cm left ovarian cyst.  The cyst has nodularity.  2014 HIDA Normal HIDA   ASSESSMENT  Ms. Tomczak is a 52 y.o. female with a pmh significant for Obesity (s/p prior  Lap-Band and now off), GERD, MDD, HLD, HTN, Colon Polyps (TAs & Hyperplastic), Thyroid Disease, Diverticulosis, PCOS, reported incomplete LES relaxation (not consistent with E GJ outflow obstruction as not [place] to classification).  The patient is seen today for evaluation and management of:  1. Gastroesophageal reflux disease, esophagitis presence not specified   2. History of colonic polyps   3. Generalized postprandial abdominal pain   4. Hiatal hernia   5. Hemorrhoids, unspecified hemorrhoid type   6. Esophageal dysmotilities    This patient is clinically and hemodynamically stable.  She has multiple GI issues that she would like [place] to work through over the course of the coming weeks and months.  Her clinic visit today was set up for discussion of colonoscopy prior to scheduling in the hospital.  However reading through her extensive notes that were sent to [place] but only reviewed at the time and after her clinic visit it seems that she is not due for colon cancer polyp surveillance until 2021 due to her last colonoscopy having a TA and hyperplastic polyp and a lipoma and thus did not meet criteria for 3-year follow-up.  With that being said she is happy to hear him have this and okay to wait on having any colonoscopies we performed.  In regards to her GI symptoms a lot of her symptoms are chronic in nature and I do agree that it is not easy to discern things that may be related to the excessive weight that she carries.  I am worried that the postprandial abdominal discomfort as well as reflux symptoms that she describes are likely related to her hiatal hernia.  We will plan to increase her PPI to twice daily Nexium to see how she does with this.  We may even add Gaviscon moving forward.  She has had multiple endoscopies over the course the last few years of the last one seems to be document 2016 before she moved to [place].  I am not clear that at any time point that she really have esophageal  biopsies to rule out EOE and we may need to consider that in the future depending on her symptoms as well as to better discern the length of her hiatal hernia.  She may benefit from a repeat upper GI series or barium swallow so that we can evaluate a large the hiatal hernia is as well.  In regards to her midepigastric abdominal pain which is been chronic again we will discuss that at her next visit when we have more time allotted to be able to optimize and understand her overall situation and care.  All questions were answered to the best of my ability and we will plan for follow-up.   PLAN  1. History of colonic polyps - Surveillance due in 2021  2. Gastroesophageal reflux disease, esophagitis presence not specified - Increase Nexium to twice daily for next 6 to 8 weeks - Patient to maintain food diary to understand her reflux symptoms  3. Generalized postprandial abdominal pain - Continue to monitor closely - Small meals more frequently during the course of the day - Depending on symptoms may consider repeat upper endoscopy, barium swallow, possible gastric emptying study - Continue to work on weight loss  4. Hiatal hernia  5. Hemorrhoids, unspecified hemorrhoid type - No issues currently  6. Esophageal dysmotilities - Documented lower esophageal relaxation impairment however not based on current [place] classification and thus would not meet criteria for E GJ outflow obstruction (this procedure was done at the time of having her lap band so this could have been playing a role with her symptoms at that point in time and it has not been repeated since) - because she has no evidence of recurrent dysphasia symptoms no plan for repeating that at this time   No orders of the defined types were placed in this encounter.   New Prescriptions   ESOMEPRAZOLE (NEXIUM) 40 MG CAPSULE    Take 1 capsule (40 mg total) by mouth 2 (two) times daily.   Modified Medications   No medications on file     Planned Follow Up: No follow-ups on file.   Justice Britain, MD [facility] Office # [phone]

## 2018-01-05 ENCOUNTER — Encounter: Payer: Self-pay | Admitting: Gastroenterology

## 2018-01-05 DIAGNOSIS — K224 Dyskinesia of esophagus: Secondary | ICD-10-CM | POA: Insufficient documentation

## 2018-01-05 DIAGNOSIS — K649 Unspecified hemorrhoids: Secondary | ICD-10-CM | POA: Insufficient documentation

## 2018-01-05 DIAGNOSIS — K449 Diaphragmatic hernia without obstruction or gangrene: Secondary | ICD-10-CM | POA: Insufficient documentation

## 2018-01-05 DIAGNOSIS — R1084 Generalized abdominal pain: Secondary | ICD-10-CM | POA: Insufficient documentation

## 2018-01-07 ENCOUNTER — Ambulatory Visit: Payer: BLUE CROSS/BLUE SHIELD | Admitting: Physical Therapy

## 2018-01-07 ENCOUNTER — Encounter: Payer: Self-pay | Admitting: Physical Therapy

## 2018-01-07 DIAGNOSIS — M5441 Lumbago with sciatica, right side: Secondary | ICD-10-CM

## 2018-01-07 DIAGNOSIS — I890 Lymphedema, not elsewhere classified: Secondary | ICD-10-CM

## 2018-01-07 DIAGNOSIS — M62830 Muscle spasm of back: Secondary | ICD-10-CM

## 2018-01-07 NOTE — Therapy (Signed)
[facility] [address] [place], [place], [address] Phone: [phone]   Fax:  [phone]  Physical Therapy Treatment  Patient Details  Name: Madison Palmer MRN: [id number] Date of Birth: 1965-11-29 Referring Provider: Lorayne Marek   Encounter Date: 01/07/2018  PT End of Session - 01/07/18 1513    Visit Number  2    Date for PT Re-Evaluation  03/04/18    PT Start Time  1438    PT Stop Time  1527    PT Time Calculation (min)  49 min    Activity Tolerance  Patient tolerated treatment well    Behavior During Therapy  [facility] for tasks assessed/performed       Past Medical History:  Diagnosis Date  . Arrhythmia    heart  . Arthritis   . Cardiomyopathy ([place])   . Chickenpox   . Depression   . Diverticulitis   . GERD (gastroesophageal reflux disease)   . Glaucoma   . Headache   . History of PCOS   . Hyperlipidemia   . Hypertension   . Inflammatory polyps of colon ([place])   . Recurrent UTI   . Thyroid disease   . UTI (urinary tract infection)     Past Surgical History:  Procedure Laterality Date  . BREAST BIOPSY  2015  . CESAREAN SECTION  2004  . INNER EAR SURGERY     ear and sinus surgery  . LAPAROSCOPIC REPAIR AND REMOVAL OF GASTRIC BAND    . OOPHORECTOMY Left   . TONSILLECTOMY AND ADENOIDECTOMY      There were no vitals filed for this visit.  Subjective Assessment - 01/07/18 1443    Subjective  Pt reports c/o knee pain more so than her back. Overall improvement in low back pain.     Currently in Pain?  Yes    Pain Score  4     Pain Location  Back   Knees pain 10 plus                      [facility] Adult PT Treatment/Exercise - 01/07/18 0001      Exercises   Exercises  Lumbar      Lumbar Exercises: Aerobic   Nustep  L1 x 81min       Lumbar Exercises: Seated   Long Arc Quad on Chair  Both;10 reps;2 sets    Other Seated Lumbar Exercises  Tband Rows green 2x15; OHP red ball 2x10     Other  Seated Lumbar Exercises  horiz abd 2x10; HS curls green tband 2x15       Modalities   Modalities  Electrical Stimulation;Moist Heat      Moist Heat Therapy   Number Minutes Moist Heat  15 Minutes    Moist Heat Location  Lumbar Spine      Electrical Stimulation   Electrical Stimulation Location  right SI area    Electrical Stimulation Action  IFC    Electrical Stimulation Parameters  sitting    Electrical Stimulation Goals  Pain               PT Short Term Goals - 01/07/18 1520      PT SHORT TERM GOAL #1   Title  independent with initial HEP    Status  Achieved        PT Long Term Goals - 01/02/18 1444      PT LONG TERM GOAL #1  Title  understand proper body mechanics for ADL's    Time  8    Period  Weeks    Status  New      PT LONG TERM GOAL #2   Title  increase lumbar ROM 25%    Time  8    Period  Weeks    Status  New      PT LONG TERM GOAL #3   Title  decrease pain 50%    Time  8    Period  Weeks    Status  New      PT LONG TERM GOAL #4   Title  report able to do her normal housework without increase pain    Time  8    Period  Weeks    Status  New            Plan - 01/07/18 1515    Clinical Impression Statement  Pt call to pushed in with WC due to her increase knee pain from OA. She did all the exercises well. Does reports some knee pain on NuStep warm up. She does reports some muscle soreness in her shoulders with the exercises.    History and Personal Factors relevant to plan of care:  OA knees, obesity, lymphedema    Rehab Potential  Good    PT Frequency  2x / week    PT Duration  8 weeks    PT Treatment/Interventions  ADLs/Self Care Home Management;Cryotherapy;Electrical Stimulation;Moist Heat;Functional mobility training;Therapeutic activities;Therapeutic exercise;Balance training;Neuromuscular re-education;Manual techniques;Patient/family education    PT Next Visit Plan  slowly add exercises and body mechanics instruction        Patient will benefit from skilled therapeutic intervention in order to improve the following deficits and impairments:  Abnormal gait, Decreased range of motion, Difficulty walking, Increased muscle spasms, Pain, Impaired flexibility, Improper body mechanics, Decreased strength, Decreased mobility  Visit Diagnosis: Acute right-sided low back pain with right-sided sciatica  Muscle spasm of back  Lymphedema, not elsewhere classified     Problem List Patient Active Problem List   Diagnosis Date Noted  . Hemorrhoids 01/05/2018  . Generalized postprandial abdominal pain 01/05/2018  . Hiatal hernia 01/05/2018  . Esophageal dysmotilities 01/05/2018  . Mixed hyperlipidemia 09/12/2017  . Multinodular goiter 07/27/2017  . Hyperparathyroidism ([place]) 06/13/2017  . Vitamin D deficiency 06/13/2017  . History of colonic polyps 05/15/2017  . Eustachian tube dysfunction, right 05/15/2017  . Lymphatic edema 05/15/2017  . Hypertension 05/14/2017  . Anemia 05/11/2017  . Arrhythmia 05/11/2017  . Diverticulosis 05/11/2017  . GERD (gastroesophageal reflux disease) 05/11/2017  . Hypothyroidism 05/11/2017  . Lower extremity edema 05/11/2017  . Morbid obesity ([place]) 05/11/2017  . Polycystic ovary syndrome 05/11/2017  . OSA on CPAP 05/11/2017  . Neck pain 05/11/2017  . Allergic rhinitis 05/11/2017  . 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

## 2018-01-10 ENCOUNTER — Ambulatory Visit: Payer: BLUE CROSS/BLUE SHIELD | Admitting: Physical Therapy

## 2018-01-10 ENCOUNTER — Encounter: Payer: Self-pay | Admitting: Physical Therapy

## 2018-01-10 DIAGNOSIS — M5441 Lumbago with sciatica, right side: Secondary | ICD-10-CM | POA: Diagnosis not present

## 2018-01-10 DIAGNOSIS — M62830 Muscle spasm of back: Secondary | ICD-10-CM

## 2018-01-10 NOTE — Therapy (Signed)
[address] [place], [place], [address] Phone: [phone]   Fax:  [phone]  Physical Therapy Treatment  Patient Details  Name: PARISA PINELA MRN: [id number] Date of Birth: [birth year] Referring Provider: Lorayne Marek   Encounter Date: 01/10/2018  PT End of Session - 01/10/18 1341    Date for PT Re-Evaluation  03/04/18    Authorization Type  3    PT Start Time  8299    PT Stop Time  1400    PT Time Calculation (min)  47 min    Activity Tolerance  Patient tolerated treatment well    Behavior During Therapy  [facility] for tasks assessed/performed       Past Medical History:  Diagnosis Date  . Arrhythmia    heart  . Arthritis   . Cardiomyopathy ([place])   . Chickenpox   . Depression   . Diverticulitis   . GERD (gastroesophageal reflux disease)   . Glaucoma   . Headache   . History of PCOS   . Hyperlipidemia   . Hypertension   . Inflammatory polyps of colon ([place])   . Recurrent UTI   . Thyroid disease   . UTI (urinary tract infection)     Past Surgical History:  Procedure Laterality Date  . BREAST BIOPSY  2015  . CESAREAN SECTION  2004  . INNER EAR SURGERY     ear and sinus surgery  . LAPAROSCOPIC REPAIR AND REMOVAL OF GASTRIC BAND    . OOPHORECTOMY Left   . TONSILLECTOMY AND ADENOIDECTOMY      There were no vitals filed for this visit.  Subjective Assessment - 01/10/18 1329    Subjective  Patient reports that she had injections (cortisone) in both knees yesterday    Currently in Pain?  Yes    Pain Score  6     Pain Location  Back    Pain Orientation  Lower    Aggravating Factors   activity increases pain    Pain Relieving Factors  the treatment here helps for a while                       [facility] Adult PT Treatment/Exercise - 01/10/18 0001      Lumbar Exercises: Seated   Other Seated Lumbar Exercises  Tband Rows and shoulder extension green 2x15; OHP red ball 2x10     Other  Seated Lumbar Exercises  weighted ball trunk rotation, physioball isometric abs, yellow tband ER all in sitting      Modalities   Modalities  Electrical Stimulation;Moist Heat      Moist Heat Therapy   Number Minutes Moist Heat  15 Minutes    Moist Heat Location  Lumbar Spine      Electrical Stimulation   Electrical Stimulation Location  right SI area    Electrical Stimulation Action  IFC    Electrical Stimulation Parameters  sitting    Electrical Stimulation Goals  Pain               PT Short Term Goals - 01/07/18 1520      PT SHORT TERM GOAL #1   Title  independent with initial HEP    Status  Achieved        PT Long Term Goals - 01/02/18 1444      PT LONG TERM GOAL #1   Title  understand proper body mechanics for ADL's    Time  8    Period  Weeks    Status  New      PT LONG TERM GOAL #2   Title  increase lumbar ROM 25%    Time  8    Period  Weeks    Status  New      PT LONG TERM GOAL #3   Title  decrease pain 50%    Time  8    Period  Weeks    Status  New      PT LONG TERM GOAL #4   Title  report able to do her normal housework without increase pain    Time  8    Period  Weeks    Status  New            Plan - 01/10/18 1341    Clinical Impression Statement  Patient with cortisone injections in both knees yesterday so we did all sitting exercises that did not involve LE's to allow the medicine to do its job.  She did have some mms soreness with seated abdominal isometrics    PT Next Visit Plan  slowly add exercises and body mechanics instruction    Consulted and Agree with Plan of Care  Patient       Patient will benefit from skilled therapeutic intervention in order to improve the following deficits and impairments:  Abnormal gait, Decreased range of motion, Difficulty walking, Increased muscle spasms, Pain, Impaired flexibility, Improper body mechanics, Decreased strength, Decreased mobility  Visit Diagnosis: Acute right-sided low back  pain with right-sided sciatica  Muscle spasm of back     Problem List Patient Active Problem List   Diagnosis Date Noted  . Hemorrhoids 01/05/2018  . Generalized postprandial abdominal pain 01/05/2018  . Hiatal hernia 01/05/2018  . Esophageal dysmotilities 01/05/2018  . Mixed hyperlipidemia 09/12/2017  . Multinodular goiter 07/27/2017  . Hyperparathyroidism ([place]) 06/13/2017  . Vitamin D deficiency 06/13/2017  . History of colonic polyps 05/15/2017  . Eustachian tube dysfunction, right 05/15/2017  . Lymphatic edema 05/15/2017  . Hypertension 05/14/2017  . Anemia 05/11/2017  . Arrhythmia 05/11/2017  . Diverticulosis 05/11/2017  . GERD (gastroesophageal reflux disease) 05/11/2017  . Hypothyroidism 05/11/2017  . Lower extremity edema 05/11/2017  . Morbid obesity ([place]) 05/11/2017  . Polycystic ovary syndrome 05/11/2017  . OSA on CPAP 05/11/2017  . Neck pain 05/11/2017  . Allergic rhinitis 05/11/2017  . 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

## 2018-01-11 ENCOUNTER — Telehealth: Payer: Self-pay | Admitting: Pulmonary Disease

## 2018-01-11 NOTE — Telephone Encounter (Signed)
Received the D/L from [place]. Patient has an OV with RA on 9/16.  Nothing further needed.

## 2018-01-14 ENCOUNTER — Ambulatory Visit (INDEPENDENT_AMBULATORY_CARE_PROVIDER_SITE_OTHER): Payer: BLUE CROSS/BLUE SHIELD | Admitting: Pulmonary Disease

## 2018-01-14 ENCOUNTER — Encounter: Payer: Self-pay | Admitting: Pulmonary Disease

## 2018-01-14 DIAGNOSIS — Z9989 Dependence on other enabling machines and devices: Secondary | ICD-10-CM | POA: Diagnosis not present

## 2018-01-14 DIAGNOSIS — G4733 Obstructive sleep apnea (adult) (pediatric): Secondary | ICD-10-CM | POA: Diagnosis not present

## 2018-01-14 NOTE — Assessment & Plan Note (Signed)
Weight loss encouraged, compliance with goal of at least 4-6 hrs every night is the expectation. Advised against medications with sedative side effects Cautioned against driving when sleepy - understanding that sleepiness will vary on a day to day basis  

## 2018-01-14 NOTE — Progress Notes (Signed)
   Subjective:    Patient ID: Madison Palmer, female    DOB: 03/19/1966, 52 y.o.   MRN: [id number]  HPI  52 year old morbidly obese woman  for FU of obstructive sleep apnea since 1992.  She underwent lap band in 2009 and lost some weight and CPAP was then decreased to 9 cm in 06/2009.  lap band was removed in 2013.    She remains sleepy in spite of good use of CPAP.  CPAP download was reviewed which shows excellent compliance more than 8 hours every night and good control of events of 9 cm without significant leak.  She has to take a nap about an hour daily. At the same time sleep is restless and this started when she wakes up around 4 AM and that his awakenings every hours she stays late in bed up to 10-11 AM  She is also going through menopausal symptoms   Significant tests/ events reviewed  NP SG 2008 showed AHI of 100/hour which was corrected by CPAP of 14 cm.     Review of Systems  Patient denies significant dyspnea,cough, hemoptysis,  chest pain, palpitations, pedal edema, orthopnea, paroxysmal nocturnal dyspnea, lightheadedness, nausea, vomiting, abdominal or  leg pains      Objective:   Physical Exam  Gen. Pleasant, obese, in no distress ENT - no lesions, no post nasal drip, class 3 airway Neck: No JVD, no thyromegaly, no carotid bruits Lungs: no use of accessory muscles, no dullness to percussion, decreased without rales or rhonchi  Cardiovascular: Rhythm regular, heart sounds  normal, no murmurs or gallops, no peripheral edema Musculoskeletal: No deformities, no cyanosis or clubbing , no tremors       Assessment & Plan:

## 2018-01-14 NOTE — Patient Instructions (Signed)
CPAP is working well Light exposure in the morning Increase activity Melatonin 3-5 mg if needed

## 2018-01-14 NOTE — Assessment & Plan Note (Addendum)
CPAP is working well, this has helped, she is compliant Light exposure in the morning Increase activity Melatonin 3-5 mg if needed  Not a good candidate for stimulant therapy

## 2018-01-15 ENCOUNTER — Ambulatory Visit: Payer: BLUE CROSS/BLUE SHIELD | Admitting: Physical Therapy

## 2018-01-15 ENCOUNTER — Encounter: Payer: Self-pay | Admitting: Physical Therapy

## 2018-01-15 DIAGNOSIS — M62830 Muscle spasm of back: Secondary | ICD-10-CM

## 2018-01-15 DIAGNOSIS — M5441 Lumbago with sciatica, right side: Secondary | ICD-10-CM | POA: Diagnosis not present

## 2018-01-15 DIAGNOSIS — I890 Lymphedema, not elsewhere classified: Secondary | ICD-10-CM

## 2018-01-15 NOTE — Therapy (Signed)
[address] [place], [place], [address] Phone: [phone]   Fax:  [phone]  Physical Therapy Treatment  Patient Details  Name: Madison Palmer MRN: [id number] Date of Birth: 11/07/1965 Referring Provider: Lorayne Marek   Encounter Date: 01/15/2018  PT End of Session - 01/15/18 1335    Visit Number  3    Date for PT Re-Evaluation  03/04/18    PT Start Time  1300    PT Stop Time  1348    PT Time Calculation (min)  48 min    Activity Tolerance  Patient tolerated treatment well    Behavior During Therapy  [facility] for tasks assessed/performed       Past Medical History:  Diagnosis Date  . Arrhythmia    heart  . Arthritis   . Cardiomyopathy ([place])   . Chickenpox   . Depression   . Diverticulitis   . GERD (gastroesophageal reflux disease)   . Glaucoma   . Headache   . History of PCOS   . Hyperlipidemia   . Hypertension   . Inflammatory polyps of colon ([place])   . Recurrent UTI   . Thyroid disease   . UTI (urinary tract infection)     Past Surgical History:  Procedure Laterality Date  . BREAST BIOPSY  2015  . CESAREAN SECTION  2004  . INNER EAR SURGERY     ear and sinus surgery  . LAPAROSCOPIC REPAIR AND REMOVAL OF GASTRIC BAND    . OOPHORECTOMY Left   . TONSILLECTOMY AND ADENOIDECTOMY      There were no vitals filed for this visit.  Subjective Assessment - 01/15/18 1304    Subjective  "knees are better than they were" Back is all right "The area that's sent me here has not bother me"    Currently in Pain?  Yes    Pain Score  4     Pain Location  Knee    Pain Orientation  Left                       [facility] Adult PT Treatment/Exercise - 01/15/18 0001      Exercises   Exercises  Lumbar      Lumbar Exercises: Aerobic   Nustep  L3 x 34min       Lumbar Exercises: Standing   Other Standing Lumbar Exercises  Rows and ext green tband 2x10      Lumbar Exercises: Seated   Other  Seated Lumbar Exercises  weighted ball trunk rotation, physioball isometric abs, yellow tband ER all in sitting               PT Short Term Goals - 01/07/18 1520      PT SHORT TERM GOAL #1   Title  independent with initial HEP    Status  Achieved        PT Long Term Goals - 01/02/18 1444      PT LONG TERM GOAL #1   Title  understand proper body mechanics for ADL's    Time  8    Period  Weeks    Status  New      PT LONG TERM GOAL #2   Title  increase lumbar ROM 25%    Time  8    Period  Weeks    Status  New      PT LONG TERM GOAL #3   Title  decrease pain  50%    Time  8    Period  Weeks    Status  New      PT LONG TERM GOAL #4   Title  report able to do her normal housework without increase pain    Time  8    Period  Weeks    Status  New            Plan - 01/15/18 1336    Clinical Impression Statement  Pt reports some knee pain after the NuStep warm up but not the same type of pain as leas week. No issues with today's exercises, she did c/o some soreness in her shoulder from the exercises.  Cues to contract core with isometric abs.     PT Frequency  2x / week    PT Duration  8 weeks    PT Treatment/Interventions  ADLs/Self Care Home Management;Cryotherapy;Electrical Stimulation;Moist Heat;Functional mobility training;Therapeutic activities;Therapeutic exercise;Balance training;Neuromuscular re-education;Manual techniques;Patient/family education    PT Next Visit Plan  slowly add exercises and body mechanics instruction       Patient will benefit from skilled therapeutic intervention in order to improve the following deficits and impairments:  Abnormal gait, Decreased range of motion, Difficulty walking, Increased muscle spasms, Pain, Impaired flexibility, Improper body mechanics, Decreased strength, Decreased mobility  Visit Diagnosis: Lymphedema, not elsewhere classified  Muscle spasm of back  Acute right-sided low back pain with right-sided  sciatica     Problem List Patient Active Problem List   Diagnosis Date Noted  . Hemorrhoids 01/05/2018  . Generalized postprandial abdominal pain 01/05/2018  . Hiatal hernia 01/05/2018  . Esophageal dysmotilities 01/05/2018  . Mixed hyperlipidemia 09/12/2017  . Multinodular goiter 07/27/2017  . Hyperparathyroidism ([place]) 06/13/2017  . Vitamin D deficiency 06/13/2017  . History of colonic polyps 05/15/2017  . Eustachian tube dysfunction, right 05/15/2017  . Lymphatic edema 05/15/2017  . Hypertension 05/14/2017  . Anemia 05/11/2017  . Arrhythmia 05/11/2017  . Diverticulosis 05/11/2017  . GERD (gastroesophageal reflux disease) 05/11/2017  . Hypothyroidism 05/11/2017  . Lower extremity edema 05/11/2017  . Morbid obesity ([place]) 05/11/2017  . Polycystic ovary syndrome 05/11/2017  . OSA on CPAP 05/11/2017  . Neck pain 05/11/2017  . Allergic rhinitis 05/11/2017  . 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

## 2018-01-17 ENCOUNTER — Ambulatory Visit: Payer: BLUE CROSS/BLUE SHIELD | Admitting: Physical Therapy

## 2018-01-17 ENCOUNTER — Encounter: Payer: Self-pay | Admitting: Physical Therapy

## 2018-01-17 DIAGNOSIS — M62830 Muscle spasm of back: Secondary | ICD-10-CM

## 2018-01-17 DIAGNOSIS — M5441 Lumbago with sciatica, right side: Secondary | ICD-10-CM | POA: Diagnosis not present

## 2018-01-17 DIAGNOSIS — I890 Lymphedema, not elsewhere classified: Secondary | ICD-10-CM

## 2018-01-17 NOTE — Therapy (Signed)
[address] [place], [place], [address] Phone: [phone]   Fax:  [phone]  Physical Therapy Treatment  Patient Details  Name: Madison Palmer MRN: [id number] Date of Birth: 05/01/1966 Referring Provider: Lorayne Marek   Encounter Date: 01/17/2018  PT End of Session - 01/17/18 1344    PT Start Time  1300    PT Stop Time  1354    PT Time Calculation (min)  54 min    Activity Tolerance  Patient tolerated treatment well    Behavior During Therapy  [facility] for tasks assessed/performed       Past Medical History:  Diagnosis Date  . Arrhythmia    heart  . Arthritis   . Cardiomyopathy ([place])   . Chickenpox   . Depression   . Diverticulitis   . GERD (gastroesophageal reflux disease)   . Glaucoma   . Headache   . History of PCOS   . Hyperlipidemia   . Hypertension   . Inflammatory polyps of colon ([place])   . Recurrent UTI   . Thyroid disease   . UTI (urinary tract infection)     Past Surgical History:  Procedure Laterality Date  . BREAST BIOPSY  2015  . CESAREAN SECTION  2004  . INNER EAR SURGERY     ear and sinus surgery  . LAPAROSCOPIC REPAIR AND REMOVAL OF GASTRIC BAND    . OOPHORECTOMY Left   . TONSILLECTOMY AND ADENOIDECTOMY      There were no vitals filed for this visit.  Subjective Assessment - 01/17/18 1311    Subjective  "Knees don't want to work today"    Currently in Pain?  Yes    Pain Score  8                        [facility] Adult PT Treatment/Exercise - 01/17/18 0001      Exercises   Exercises  Lumbar      Lumbar Exercises: Aerobic   UBE (Upper Arm Bike)  L1 x2 min each war     Nustep  L3 x 33mn       Lumbar Exercises: Seated   Other Seated Lumbar Exercises  Tband Rows and shoulder extension green 2x15; OHP red ball 2x10     Other Seated Lumbar Exercises  weighted ball trunk rotation, physioball isometric abs, yellow tband ER all in sitting      Modalities    Modalities  Electrical Stimulation;Moist Heat      Moist Heat Therapy   Number Minutes Moist Heat  15 Minutes    Moist Heat Location  Lumbar Spine      Electrical Stimulation   Electrical Stimulation Location  right SI area    Electrical Stimulation Action  IFC    Electrical Stimulation Parameters  sitting    Electrical Stimulation Goals  Pain               PT Short Term Goals - 01/07/18 1520      PT SHORT TERM GOAL #1   Title  independent with initial HEP    Status  Achieved        PT Long Term Goals - 01/17/18 1345      PT LONG TERM GOAL #1   Title  understand proper body mechanics for ADL's    Status  Partially Met      PT LONG TERM GOAL #2   Title  increase  lumbar ROM 25%    Status  Partially Met      PT LONG TERM GOAL #3   Status  Partially Met            Plan - 01/17/18 1345    Clinical Impression Statement  Pt pushed into clinic in [facility] due to her knee pain. She reports improvement overall regarding her low back pain. Only some scapular soreness reported with today's exercises. Again cues to contract core with ab sets.     Rehab Potential  Good    PT Frequency  2x / week    PT Duration  8 weeks    PT Treatment/Interventions  ADLs/Self Care Home Management;Cryotherapy;Electrical Stimulation;Moist Heat;Functional mobility training;Therapeutic activities;Therapeutic exercise;Balance training;Neuromuscular re-education;Manual techniques;Patient/family education    PT Next Visit Plan  slowly add exercises and body mechanics instruction       Patient will benefit from skilled therapeutic intervention in order to improve the following deficits and impairments:  Abnormal gait, Decreased range of motion, Difficulty walking, Increased muscle spasms, Pain, Impaired flexibility, Improper body mechanics, Decreased strength, Decreased mobility  Visit Diagnosis: Lymphedema, not elsewhere classified  Muscle spasm of back  Acute right-sided low back pain with  right-sided sciatica     Problem List Patient Active Problem List   Diagnosis Date Noted  . Hemorrhoids 01/05/2018  . Generalized postprandial abdominal pain 01/05/2018  . Hiatal hernia 01/05/2018  . Esophageal dysmotilities 01/05/2018  . Mixed hyperlipidemia 09/12/2017  . Multinodular goiter 07/27/2017  . Hyperparathyroidism ([place]) 06/13/2017  . Vitamin D deficiency 06/13/2017  . History of colonic polyps 05/15/2017  . Eustachian tube dysfunction, right 05/15/2017  . Lymphatic edema 05/15/2017  . Hypertension 05/14/2017  . Anemia 05/11/2017  . Arrhythmia 05/11/2017  . Diverticulosis 05/11/2017  . GERD (gastroesophageal reflux disease) 05/11/2017  . Hypothyroidism 05/11/2017  . Lower extremity edema 05/11/2017  . Morbid obesity ([place]) 05/11/2017  . Polycystic ovary syndrome 05/11/2017  . OSA on CPAP 05/11/2017  . Neck pain 05/11/2017  . Allergic rhinitis 05/11/2017  . 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

## 2018-01-22 ENCOUNTER — Encounter: Payer: Self-pay | Admitting: Physical Therapy

## 2018-01-22 ENCOUNTER — Ambulatory Visit: Payer: BLUE CROSS/BLUE SHIELD | Admitting: Physical Therapy

## 2018-01-22 DIAGNOSIS — M62830 Muscle spasm of back: Secondary | ICD-10-CM

## 2018-01-22 DIAGNOSIS — M5441 Lumbago with sciatica, right side: Secondary | ICD-10-CM

## 2018-01-22 DIAGNOSIS — I890 Lymphedema, not elsewhere classified: Secondary | ICD-10-CM

## 2018-01-22 NOTE — Therapy (Signed)
[address] [place], [place], [address] Phone: [phone]   Fax:  [phone]  Physical Therapy Treatment  Patient Details  Name: Madison Palmer MRN: [id number] Date of Birth: 07-04-1965 Referring Provider: Lorayne Marek   Encounter Date: 01/22/2018  PT End of Session - 01/22/18 1429    Visit Number  4    PT Start Time  1350    PT Stop Time  1444    PT Time Calculation (min)  54 min    Activity Tolerance  Patient tolerated treatment well    Behavior During Therapy  [facility] for tasks assessed/performed       Past Medical History:  Diagnosis Date  . Arrhythmia    heart  . Arthritis   . Cardiomyopathy ([place])   . Chickenpox   . Depression   . Diverticulitis   . GERD (gastroesophageal reflux disease)   . Glaucoma   . Headache   . History of PCOS   . Hyperlipidemia   . Hypertension   . Inflammatory polyps of colon ([place])   . Recurrent UTI   . Thyroid disease   . UTI (urinary tract infection)     Past Surgical History:  Procedure Laterality Date  . BREAST BIOPSY  2015  . CESAREAN SECTION  2004  . INNER EAR SURGERY     ear and sinus surgery  . LAPAROSCOPIC REPAIR AND REMOVAL OF GASTRIC BAND    . OOPHORECTOMY Left   . TONSILLECTOMY AND ADENOIDECTOMY      There were no vitals filed for this visit.  Subjective Assessment - 01/22/18 1400    Subjective  "So So, I took my ibuprofen"    Currently in Pain?  Yes    Pain Score  7     Pain Location  Back    Pain Orientation  Upper                       [facility] Adult PT Treatment/Exercise - 01/22/18 0001      Exercises   Exercises  Lumbar      Lumbar Exercises: Aerobic   UBE (Upper Arm Bike)  L2 x2 min each war       Lumbar Exercises: Seated   Long Arc Quad on Chair  Both;10 reps;1 set    Other Seated Lumbar Exercises  Tband Rows and shoulder extension green 2x15; OHP yellow ball 2x10, yellow HS curls x10     Other Seated Lumbar  Exercises  weighted ball trunk rotation, physioball isometric abs, yellow tband ER all in sitting      Modalities   Modalities  Electrical Stimulation;Moist Heat      Moist Heat Therapy   Number Minutes Moist Heat  15 Minutes    Moist Heat Location  Lumbar Spine      Electrical Stimulation   Electrical Stimulation Location  right SI area    Electrical Stimulation Action  IFC    Electrical Stimulation Parameters  sitting    Electrical Stimulation Goals  Pain               PT Short Term Goals - 01/07/18 1520      PT SHORT TERM GOAL #1   Title  independent with initial HEP    Status  Achieved        PT Long Term Goals - 01/22/18 1429      PT LONG TERM GOAL #2   Title  increase lumbar ROM 25%    Status  Partially Met      PT LONG TERM GOAL #3   Title  decrease pain 50%    Status  Partially Met      PT LONG TERM GOAL #4   Title  report able to do her normal housework without increase pain    Status  Partially Met            Plan - 01/22/18 1429    Clinical Impression Statement  Again pt pushed into clinic in [facility]. C/O knee pain greater than back. She expressed some recent HS pain and pulling that could be attributed with the HS curls. Increase load with seated OHP and no issue. Cues to keep shoulder back with rows.    PT Frequency  2x / week    PT Duration  8 weeks    PT Next Visit Plan  slowly add exercises and body mechanics instruction       Patient will benefit from skilled therapeutic intervention in order to improve the following deficits and impairments:  Abnormal gait, Decreased range of motion, Difficulty walking, Increased muscle spasms, Pain, Impaired flexibility, Improper body mechanics, Decreased strength, Decreased mobility  Visit Diagnosis: Lymphedema, not elsewhere classified  Muscle spasm of back  Acute right-sided low back pain with right-sided sciatica     Problem List Patient Active Problem List   Diagnosis Date Noted  .  Hemorrhoids 01/05/2018  . Generalized postprandial abdominal pain 01/05/2018  . Hiatal hernia 01/05/2018  . Esophageal dysmotilities 01/05/2018  . Mixed hyperlipidemia 09/12/2017  . Multinodular goiter 07/27/2017  . Hyperparathyroidism ([place]) 06/13/2017  . Vitamin D deficiency 06/13/2017  . History of colonic polyps 05/15/2017  . Eustachian tube dysfunction, right 05/15/2017  . Lymphatic edema 05/15/2017  . Hypertension 05/14/2017  . Anemia 05/11/2017  . Arrhythmia 05/11/2017  . Diverticulosis 05/11/2017  . GERD (gastroesophageal reflux disease) 05/11/2017  . Hypothyroidism 05/11/2017  . Lower extremity edema 05/11/2017  . Morbid obesity ([place]) 05/11/2017  . Polycystic ovary syndrome 05/11/2017  . OSA on CPAP 05/11/2017  . Neck pain 05/11/2017  . Allergic rhinitis 05/11/2017  . 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

## 2018-01-24 ENCOUNTER — Encounter: Payer: Self-pay | Admitting: Physical Therapy

## 2018-01-24 ENCOUNTER — Ambulatory Visit: Payer: BLUE CROSS/BLUE SHIELD | Admitting: Physical Therapy

## 2018-01-24 DIAGNOSIS — M5441 Lumbago with sciatica, right side: Secondary | ICD-10-CM

## 2018-01-24 DIAGNOSIS — M62830 Muscle spasm of back: Secondary | ICD-10-CM

## 2018-01-24 NOTE — Therapy (Signed)
[facility] [address] [place], [place], [address] Phone: [phone]   Fax:  [phone]  Physical Therapy Treatment  Patient Details  Name: Madison Palmer MRN: [id number] Date of Birth: May 24, 1965 Referring Provider (PT): [name]   Encounter Date: 01/24/2018  PT End of Session - 01/24/18 1520    Visit Number  5    Date for PT Re-Evaluation  03/04/18    PT Start Time  3716    PT Stop Time  1550    PT Time Calculation (min)  58 min       Past Medical History:  Diagnosis Date  . Arrhythmia    heart  . Arthritis   . Cardiomyopathy ([place])   . Chickenpox   . Depression   . Diverticulitis   . GERD (gastroesophageal reflux disease)   . Glaucoma   . Headache   . History of PCOS   . Hyperlipidemia   . Hypertension   . Inflammatory polyps of colon ([place])   . Recurrent UTI   . Thyroid disease   . UTI (urinary tract infection)     Past Surgical History:  Procedure Laterality Date  . BREAST BIOPSY  2015  . CESAREAN SECTION  2004  . INNER EAR SURGERY     ear and sinus surgery  . LAPAROSCOPIC REPAIR AND REMOVAL OF GASTRIC BAND    . OOPHORECTOMY Left   . TONSILLECTOMY AND ADENOIDECTOMY      There were no vitals filed for this visit.  Subjective Assessment - 01/24/18 1453    Subjective  okay     Currently in Pain?  Yes    Pain Score  7     Pain Location  Back                       [facility] Adult PT Treatment/Exercise - 01/24/18 0001      Exercises   Exercises  Lumbar      Lumbar Exercises: Aerobic   Nustep  L3 x 2 min   stopped d/t knee pain     Lumbar Exercises: Seated   Long Arc Quad on Chair  Both;10 reps;1 set   marching and hip abd 10 each   Other Seated Lumbar Exercises  Tband Rows and shoulder extension green 2x15; OHP yellow ball 2x10, yellow HS curls x15    green tband trunk rotation 15 each   Other Seated Lumbar Exercises  weighted ball trunk rotation, physioball isometric  abs, yellow tband ER all in sitting      Modalities   Modalities  Electrical Stimulation;Moist Heat      Moist Heat Therapy   Number Minutes Moist Heat  15 Minutes    Moist Heat Location  Lumbar Spine      Electrical Stimulation   Electrical Stimulation Location  right SI area    Electrical Stimulation Action  IFC    Electrical Stimulation Parameters  sitting    Electrical Stimulation Goals  Pain               PT Short Term Goals - 01/07/18 1520      PT SHORT TERM GOAL #1   Title  independent with initial HEP    Status  Achieved        PT Long Term Goals - 01/24/18 1521      PT LONG TERM GOAL #1   Title  understand proper body mechanics for ADL's  Status  Partially Met      PT LONG TERM GOAL #2   Title  increase lumbar ROM 25%    Status  Partially Met      PT LONG TERM GOAL #3   Title  decrease pain 50%    Status  Partially Met      PT LONG TERM GOAL #4   Title  report able to do her normal housework without increase pain    Status  Partially Met            Plan - 01/24/18 1522    Clinical Impression Statement  pt verb motivated to get better and stronger. educ on needing to go slow and progress LE and trunk strength as tolerated. knees are very limiting d/t pain. progressing towards goals    PT Treatment/Interventions  ADLs/Self Care Home Management;Cryotherapy;Electrical Stimulation;Moist Heat;Functional mobility training;Therapeutic activities;Therapeutic exercise;Balance training;Neuromuscular re-education;Manual techniques;Patient/family education    PT Next Visit Plan  slowly add exercises and body mechanics instruction       Patient will benefit from skilled therapeutic intervention in order to improve the following deficits and impairments:  Abnormal gait, Decreased range of motion, Difficulty walking, Increased muscle spasms, Pain, Impaired flexibility, Improper body mechanics, Decreased strength, Decreased mobility  Visit  Diagnosis: Muscle spasm of back  Acute right-sided low back pain with right-sided sciatica     Problem List Patient Active Problem List   Diagnosis Date Noted  . Hemorrhoids 01/05/2018  . Generalized postprandial abdominal pain 01/05/2018  . Hiatal hernia 01/05/2018  . Esophageal dysmotilities 01/05/2018  . Mixed hyperlipidemia 09/12/2017  . Multinodular goiter 07/27/2017  . Hyperparathyroidism ([place]) 06/13/2017  . Vitamin D deficiency 06/13/2017  . History of colonic polyps 05/15/2017  . Eustachian tube dysfunction, right 05/15/2017  . Lymphatic edema 05/15/2017  . Hypertension 05/14/2017  . Anemia 05/11/2017  . Arrhythmia 05/11/2017  . Diverticulosis 05/11/2017  . GERD (gastroesophageal reflux disease) 05/11/2017  . Hypothyroidism 05/11/2017  . Lower extremity edema 05/11/2017  . Morbid obesity ([place]) 05/11/2017  . Polycystic ovary syndrome 05/11/2017  . OSA on CPAP 05/11/2017  . Neck pain 05/11/2017  . Allergic rhinitis 05/11/2017  . 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

## 2018-01-25 ENCOUNTER — Encounter: Payer: Self-pay | Admitting: Endocrinology

## 2018-01-25 ENCOUNTER — Ambulatory Visit: Payer: BLUE CROSS/BLUE SHIELD | Admitting: Endocrinology

## 2018-01-25 VITALS — BP 152/100 | HR 90 | Ht 64.0 in | Wt 356.8 lb

## 2018-01-25 DIAGNOSIS — R635 Abnormal weight gain: Secondary | ICD-10-CM | POA: Insufficient documentation

## 2018-01-25 DIAGNOSIS — E213 Hyperparathyroidism, unspecified: Secondary | ICD-10-CM | POA: Diagnosis not present

## 2018-01-25 HISTORY — DX: Abnormal weight gain: R63.5

## 2018-01-25 LAB — CORTISOL: CORTISOL PLASMA: 13.1 ug/dL

## 2018-01-25 LAB — VITAMIN D 25 HYDROXY (VIT D DEFICIENCY, FRACTURES): VITD: 25.73 ng/mL — AB (ref 30.00–100.00)

## 2018-01-25 MED ORDER — LEVOTHYROXINE SODIUM 75 MCG PO TABS: ORAL_TABLET | ORAL | 0 refills | Status: DC

## 2018-01-25 NOTE — Patient Instructions (Addendum)
Your blood pressure is high today.  Please see your primary care provider soon, to have it rechecked.   blood tests are requested for you today.  We'll let you know about the results.   Please come back for a follow-up appointment in 6 months.   

## 2018-01-25 NOTE — Progress Notes (Signed)
Subjective:    Patient ID: Madison Palmer, female    DOB: 1965-05-31, 52 y.o.   MRN: [id number]  HPI: Hypothyroidism (dx'ed in 1992.  she has been on prescribed thyroid hormone therapy since then).   Multinodular goiter: she has had surveillance ultrasounds; he had a thyroid bx in 2014, and again in early 2019).    hyperparathyroidism.  It has been uncertain if it is primary, secondary, or both.   Vit-D def: she was rx'ed high-dose in 7/19.   She has moderate arthralgias throughout the body, and assoc weight gain.  She has 1 more ergocalciferol pill to take.   Past Medical History:  Diagnosis Date  . Arrhythmia    heart  . Arthritis   . Cardiomyopathy ([place])   . Chickenpox   . Depression   . Diverticulitis   . GERD (gastroesophageal reflux disease)   . Glaucoma   . Headache   . History of PCOS   . Hyperlipidemia   . Hypertension   . Inflammatory polyps of colon ([place])   . Recurrent UTI   . Thyroid disease   . UTI (urinary tract infection)     Past Surgical History:  Procedure Laterality Date  . BREAST BIOPSY  2015  . CESAREAN SECTION  2004  . INNER EAR SURGERY     ear and sinus surgery  . LAPAROSCOPIC REPAIR AND REMOVAL OF GASTRIC BAND    . OOPHORECTOMY Left   . TONSILLECTOMY AND ADENOIDECTOMY      Social History   Socioeconomic History  . Marital status: Married    Spouse name: Not on file  . Number of children: Not on file  . Years of education: Not on file  . Highest education level: Not on file  Occupational History  . Not on file  Social Needs  . Financial resource strain: Not on file  . Food insecurity:    Worry: Not on file    Inability: Not on file  . Transportation needs:    Medical: Not on file    Non-medical: Not on file  Tobacco Use  . Smoking status: Never Smoker  . Smokeless tobacco: Never Used  Substance and Sexual Activity  . Alcohol use: Yes    Comment: social  . Drug use: No  . Sexual activity: Not on file  Lifestyle  . Physical  activity:    Days per week: Not on file    Minutes per session: Not on file  . Stress: Not on file  Relationships  . Social connections:    Talks on phone: Not on file    Gets together: Not on file    Attends religious service: Not on file    Active member of club or organization: Not on file    Attends meetings of clubs or organizations: Not on file    Relationship status: Not on file  . Intimate partner violence:    Fear of current or ex partner: Not on file    Emotionally abused: Not on file    Physically abused: Not on file    Forced sexual activity: Not on file  Other Topics Concern  . Not on file  Social History Narrative  . Not on file    Current Outpatient Medications on File Prior to Visit  Medication Sig Dispense Refill  . Acetaminophen (TYLENOL EXTRA STRENGTH PO) Take by mouth. Takes 2 tablets 2-3 times a day.    Marland Kitchen atenolol (TENORMIN) 25 MG tablet TK 1 T PO  D  0  . ergocalciferol (VITAMIN D2) 50000 units capsule Take 1 capsule (50,000 Units total) by mouth once a week. 12 capsule 0  . esomeprazole (NEXIUM) 40 MG capsule Take 1 capsule (40 mg total) by mouth 2 (two) times daily. 180 capsule 0  . fluticasone (FLONASE) 50 MCG/ACT nasal spray Place 2 sprays into both nostrils daily.    . hydrALAZINE (APRESOLINE) 50 MG tablet Take 1tab in morning and afternoon, and 2tabs at bedtime 120 tablet 6  . hydrochlorothiazide (HYDRODIURIL) 25 MG tablet Take 1 tablet (25 mg total) by mouth daily. 90 tablet 1  . ibuprofen (ADVIL,MOTRIN) 600 MG tablet Take 600 mg by mouth every 6 (six) hours as needed.    Marland Kitchen omega-3 fish oil (MAXEPA) 1000 MG CAPS capsule Take 2 capsules (2,000 mg total) by mouth 2 (two) times daily. (Patient not taking: Reported on 01/25/2018) 90 each   . spironolactone (ALDACTONE) 25 MG tablet Take 1 tablet (25 mg total) by mouth daily. (Patient not taking: Reported on 01/25/2018) 30 tablet 0   No current facility-administered medications on file prior to visit.      Allergies  Allergen Reactions  . Fentanyl Hives  . Levofloxacin Hives  . Midazolam Hives  . Pollen Extract     seasonal seasonal  . Atorvastatin     Muscle pain in legs   . Rosuvastatin     Abdominal pain    Family History  Adopted: Yes    BP (!) 152/100 (BP Location: Right Arm, Patient Position: Sitting, Cuff Size: Large)   Pulse 90   Ht 5\' 4"  (1.626 m)   Wt (!) 356 lb 12.8 oz (161.8 kg)   SpO2 91%   BMI 61.24 kg/m   Review of Systems She has intermitt muscle cramps.      Objective:   Physical Exam VITAL SIGNS:  See vs page GENERAL: no distress NECK: There is no palpable thyroid enlargement.  No thyroid nodule is palpable.  No palpable lymphadenopathy at the anterior neck.     DEXA (2019): normal.   Lab Results  Component Value Date   TSH 0.97 12/11/2017      Assessment & Plan:  Multinodular goiter, apparently benign.  We'll follow Hypothyroidism: well-replaced. We discussed.  She declines to change synthroid to a qd dosage.  Hyperparathyroidism, prob primary.  Recheck today.  HTN: is noted today.   Vit-D def: recheck today.  Weight gain: check cortisol  Patient Instructions  Your blood pressure is high today.  Please see your primary care provider soon, to have it rechecked blood tests are requested for you today.  We'll let you know about the results.    Please come back for a follow-up appointment in 6 months.

## 2018-01-28 ENCOUNTER — Ambulatory Visit: Payer: BLUE CROSS/BLUE SHIELD | Admitting: Physical Therapy

## 2018-01-28 ENCOUNTER — Encounter: Payer: Self-pay | Admitting: Physical Therapy

## 2018-01-28 DIAGNOSIS — M5441 Lumbago with sciatica, right side: Secondary | ICD-10-CM | POA: Diagnosis not present

## 2018-01-28 DIAGNOSIS — I890 Lymphedema, not elsewhere classified: Secondary | ICD-10-CM

## 2018-01-28 DIAGNOSIS — M62830 Muscle spasm of back: Secondary | ICD-10-CM

## 2018-01-28 NOTE — Therapy (Signed)
[facility] [place], [place], [address] Phone: [phone]   Fax:  [phone]  Physical Therapy Treatment  Patient Details  Name: Madison Palmer MRN: [id number] Date of Birth: 1965-12-26 Referring Provider (PT): [name]   Encounter Date: 01/28/2018  PT End of Session - 01/28/18 0845    Visit Number  6    Date for PT Re-Evaluation  03/04/18    PT Start Time  0810    PT Stop Time  0900    PT Time Calculation (min)  50 min    Activity Tolerance  Patient tolerated treatment well    Behavior During Therapy  [facility] for tasks assessed/performed       Past Medical History:  Diagnosis Date  . Arrhythmia    heart  . Arthritis   . Cardiomyopathy ([place])   . Chickenpox   . Depression   . Diverticulitis   . GERD (gastroesophageal reflux disease)   . Glaucoma   . Headache   . History of PCOS   . Hyperlipidemia   . Hypertension   . Inflammatory polyps of colon ([place])   . Recurrent UTI   . Thyroid disease   . UTI (urinary tract infection)     Past Surgical History:  Procedure Laterality Date  . BREAST BIOPSY  2015  . CESAREAN SECTION  2004  . INNER EAR SURGERY     ear and sinus surgery  . LAPAROSCOPIC REPAIR AND REMOVAL OF GASTRIC BAND    . OOPHORECTOMY Left   . TONSILLECTOMY AND ADENOIDECTOMY      There were no vitals filed for this visit.  Subjective Assessment - 01/28/18 0813    Subjective  "Shoulder and stuff has been bothering me over the weekend, and I had some back spasms"     Currently in Pain?  Yes    Pain Score  6     Pain Location  Back    Pain Orientation  Mid;Upper    Pain Descriptors / Indicators  Burning                       [facility] Adult PT Treatment/Exercise - 01/28/18 0001      Exercises   Exercises  Lumbar      Lumbar Exercises: Aerobic   UBE (Upper Arm Bike)  L4 x3 min each war       Lumbar Exercises: Seated   Long Arc Quad on Chair  Both;10 reps;2  sets;Weights    Sit to Stand  5 reps    Other Seated Lumbar Exercises  HS curls yellow 2x15, Horiz abs yellow 2x10; Seated ball OHP 2x10; Wt ball trunk rotations 2x10     Other Seated Lumbar Exercises  shoulder ER yellow 2x10; Rows blue tband 2x10       Modalities   Modalities  Electrical Stimulation;Moist Heat      Moist Heat Therapy   Number Minutes Moist Heat  15 Minutes    Moist Heat Location  Lumbar Spine      Electrical Stimulation   Electrical Stimulation Location  Mid back    Electrical Stimulation Action  IFC    Electrical Stimulation Parameters  sitting    Electrical Stimulation Goals  Pain               PT Short Term Goals - 01/07/18 1520      PT SHORT TERM GOAL #1   Title  independent with  initial HEP    Status  Achieved        PT Long Term Goals - 01/24/18 1521      PT LONG TERM GOAL #1   Title  understand proper body mechanics for ADL's    Status  Partially Met      PT LONG TERM GOAL #2   Title  increase lumbar ROM 25%    Status  Partially Met      PT LONG TERM GOAL #3   Title  decrease pain 50%    Status  Partially Met      PT LONG TERM GOAL #4   Title  report able to do her normal housework without increase pain    Status  Partially Met            Plan - 01/28/18 0849    Clinical Impression Statement  Pt ~ 10 minutes late for today's treatment session. Pt enters clinic with c/o soreness between shoulder blades. The potential of knee pain limites therapy interventions, all exercises performed in seating. Pt requires occasional cues to sit up straight and contract core.     Rehab Potential  Good    PT Frequency  2x / week    PT Duration  8 weeks    PT Treatment/Interventions  ADLs/Self Care Home Management;Cryotherapy;Electrical Stimulation;Moist Heat;Functional mobility training;Therapeutic activities;Therapeutic exercise;Balance training;Neuromuscular re-education;Manual techniques;Patient/family education    PT Next Visit Plan   slowly add exercises and body mechanics instruction       Patient will benefit from skilled therapeutic intervention in order to improve the following deficits and impairments:  Abnormal gait, Decreased range of motion, Difficulty walking, Increased muscle spasms, Pain, Impaired flexibility, Improper body mechanics, Decreased strength, Decreased mobility  Visit Diagnosis: Muscle spasm of back  Acute right-sided low back pain with right-sided sciatica  Lymphedema, not elsewhere classified     Problem List Patient Active Problem List   Diagnosis Date Noted  . Weight gain 01/25/2018  . Hemorrhoids 01/05/2018  . Generalized postprandial abdominal pain 01/05/2018  . Hiatal hernia 01/05/2018  . Esophageal dysmotilities 01/05/2018  . Mixed hyperlipidemia 09/12/2017  . Multinodular goiter 07/27/2017  . Hyperparathyroidism ([place]) 06/13/2017  . Vitamin D deficiency 06/13/2017  . History of colonic polyps 05/15/2017  . Eustachian tube dysfunction, right 05/15/2017  . Lymphatic edema 05/15/2017  . Hypertension 05/14/2017  . Anemia 05/11/2017  . Arrhythmia 05/11/2017  . Diverticulosis 05/11/2017  . GERD (gastroesophageal reflux disease) 05/11/2017  . Hypothyroidism 05/11/2017  . Lower extremity edema 05/11/2017  . Morbid obesity ([place]) 05/11/2017  . Polycystic ovary syndrome 05/11/2017  . OSA on CPAP 05/11/2017  . Neck pain 05/11/2017  . Allergic rhinitis 05/11/2017  . 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

## 2018-01-29 LAB — PTH, INTACT AND CALCIUM
CALCIUM: 10.4 mg/dL (ref 8.6–10.4)
PTH: 202 pg/mL — ABNORMAL HIGH (ref 14–64)

## 2018-01-30 ENCOUNTER — Other Ambulatory Visit: Payer: Self-pay | Admitting: Endocrinology

## 2018-01-30 DIAGNOSIS — E213 Hyperparathyroidism, unspecified: Secondary | ICD-10-CM

## 2018-01-30 DIAGNOSIS — E559 Vitamin D deficiency, unspecified: Secondary | ICD-10-CM

## 2018-01-30 MED ORDER — ERGOCALCIFEROL 1.25 MG (50000 UT) PO CAPS: 50000.0000 [IU] | ORAL_CAPSULE | ORAL | 0 refills | Status: DC

## 2018-02-01 ENCOUNTER — Encounter: Payer: BLUE CROSS/BLUE SHIELD | Admitting: Physical Therapy

## 2018-02-04 ENCOUNTER — Encounter: Payer: Self-pay | Admitting: Physical Therapy

## 2018-02-04 ENCOUNTER — Ambulatory Visit: Payer: BLUE CROSS/BLUE SHIELD | Attending: Nurse Practitioner | Admitting: Physical Therapy

## 2018-02-04 DIAGNOSIS — M5441 Lumbago with sciatica, right side: Secondary | ICD-10-CM | POA: Insufficient documentation

## 2018-02-04 DIAGNOSIS — I890 Lymphedema, not elsewhere classified: Secondary | ICD-10-CM | POA: Diagnosis present

## 2018-02-04 DIAGNOSIS — M62830 Muscle spasm of back: Secondary | ICD-10-CM | POA: Diagnosis present

## 2018-02-04 NOTE — Therapy (Signed)
[facility] [address] [place], [place], [address] Phone: [phone]   Fax:  [phone]  Physical Therapy Treatment  Patient Details  Name: Madison Palmer MRN: [id number] Date of Birth: 05/13/1965 Referring Provider (PT): [name]   Encounter Date: 02/04/2018  PT End of Session - 02/04/18 1432    Visit Number  7    Date for PT Re-Evaluation  03/04/18    PT Start Time  1351    PT Stop Time  1445    PT Time Calculation (min)  54 min    Activity Tolerance  Patient tolerated treatment well    Behavior During Therapy  WFL for tasks assessed/performed       Past Medical History:  Diagnosis Date  . Arrhythmia    heart  . Arthritis   . Cardiomyopathy ([facility])   . Chickenpox   . Depression   . Diverticulitis   . GERD (gastroesophageal reflux disease)   . Glaucoma   . Headache   . History of PCOS   . Hyperlipidemia   . Hypertension   . Inflammatory polyps of colon ([facility])   . Recurrent UTI   . Thyroid disease   . UTI (urinary tract infection)     Past Surgical History:  Procedure Laterality Date  . BREAST BIOPSY  2015  . CESAREAN SECTION  2004  . INNER EAR SURGERY     ear and sinus surgery  . LAPAROSCOPIC REPAIR AND REMOVAL OF GASTRIC BAND    . OOPHORECTOMY Left   . TONSILLECTOMY AND ADENOIDECTOMY      There were no vitals filed for this visit.  Subjective Assessment - 02/04/18 1403    Subjective  "Other than my knees Im fine"    Currently in Pain?  Yes    Pain Score  4     Pain Location  --   across shoulders                      [facility] Adult PT Treatment/Exercise - 02/04/18 0001      Exercises   Exercises  Lumbar      Lumbar Exercises: Aerobic   UBE (Upper Arm Bike)  L4 x3 min each war       Lumbar Exercises: Seated   Long Arc Quad on Chair  Both;2 sets;Weights;15 reps    LAQ on Chair Weights (lbs)  2    Other Seated Lumbar Exercises  HS curls yellow 2x15, Horiz abs yellow  2x10; Seated ball OHP 2x10; Wt ball trunk rotations 2x10     Other Seated Lumbar Exercises  shoulder ER yellow 2x10; Rows blue tband 2x15      Modalities   Modalities  Electrical Stimulation;Moist Heat      Moist Heat Therapy   Number Minutes Moist Heat  15 Minutes    Moist Heat Location  Lumbar Spine      Electrical Stimulation   Electrical Stimulation Location  Mid back    Electrical Stimulation Action  --   IFC   Electrical Stimulation Parameters  sitting    Electrical Stimulation Goals  Pain               PT Short Term Goals - 01/07/18 1520      PT SHORT TERM GOAL #1   Title  independent with initial HEP    Status  Achieved        PT Long Term Goals - 02/04/18 1434        PT LONG TERM GOAL #1   Title  understand proper body mechanics for ADL's    Status  Partially Met      PT LONG TERM GOAL #2   Title  increase lumbar ROM 25%    Status  Partially Met      PT LONG TERM GOAL #3   Title  decrease pain 50%    Status  Achieved      PT LONG TERM GOAL #4   Title  report able to do her normal housework without increase pain    Status  Partially Met            Plan - 02/04/18 1433    Clinical Impression Statement  Pt ~ 6 minutes late for today's treatment session. She reports that she could feel ger back more with the slightly heavier Wt ball. No issues reported with LAQ,. Poor postural awareness with seated exercises, requires constant cues to contract core and maintain good posture.    Rehab Potential  Good    PT Treatment/Interventions  ADLs/Self Care Home Management;Cryotherapy;Electrical Stimulation;Moist Heat;Functional mobility training;Therapeutic activities;Therapeutic exercise;Balance training;Neuromuscular re-education;Manual techniques;Patient/family education    PT Next Visit Plan  slowly add exercises and body mechanics instruction       Patient will benefit from skilled therapeutic intervention in order to improve the following deficits and  impairments:  Abnormal gait, Decreased range of motion, Difficulty walking, Increased muscle spasms, Pain, Impaired flexibility, Improper body mechanics, Decreased strength, Decreased mobility  Visit Diagnosis: Acute right-sided low back pain with right-sided sciatica  Muscle spasm of back  Lymphedema, not elsewhere classified     Problem List Patient Active Problem List   Diagnosis Date Noted  . Weight gain 01/25/2018  . Hemorrhoids 01/05/2018  . Generalized postprandial abdominal pain 01/05/2018  . Hiatal hernia 01/05/2018  . Esophageal dysmotilities 01/05/2018  . Mixed hyperlipidemia 09/12/2017  . Multinodular goiter 07/27/2017  . Hyperparathyroidism ([place]) 06/13/2017  . Vitamin D deficiency 06/13/2017  . History of colonic polyps 05/15/2017  . Eustachian tube dysfunction, right 05/15/2017  . Lymphatic edema 05/15/2017  . Hypertension 05/14/2017  . Anemia 05/11/2017  . Arrhythmia 05/11/2017  . Diverticulosis 05/11/2017  . GERD (gastroesophageal reflux disease) 05/11/2017  . Hypothyroidism 05/11/2017  . Lower extremity edema 05/11/2017  . Morbid obesity ([place]) 05/11/2017  . Polycystic ovary syndrome 05/11/2017  . OSA on CPAP 05/11/2017  . Neck pain 05/11/2017  . Allergic rhinitis 05/11/2017  . 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

## 2018-02-07 ENCOUNTER — Ambulatory Visit: Payer: BLUE CROSS/BLUE SHIELD | Admitting: Physical Therapy

## 2018-02-07 ENCOUNTER — Encounter: Payer: Self-pay | Admitting: Physical Therapy

## 2018-02-07 DIAGNOSIS — M5441 Lumbago with sciatica, right side: Secondary | ICD-10-CM

## 2018-02-07 DIAGNOSIS — M62830 Muscle spasm of back: Secondary | ICD-10-CM

## 2018-02-07 NOTE — Therapy (Deleted)
[place], [place], [address] Phone: [phone]   Fax:  [phone]  Physical Therapy Treatment  Patient Details  Name: Madison Palmer MRN: [id number] Date of Birth: 1965-08-30 Referring Provider (PT): [name]   Encounter Date: 02/07/2018  PT End of Session - 02/07/18 1504    Visit Number  8    PT Start Time  1430    PT Stop Time  1515    PT Time Calculation (min)  45 min    Activity Tolerance  Patient limited by pain    Behavior During Therapy  [facility] for tasks assessed/performed       Past Medical History:  Diagnosis Date  . Arrhythmia    heart  . Arthritis   . Cardiomyopathy ([place])   . Chickenpox   . Depression   . Diverticulitis   . GERD (gastroesophageal reflux disease)   . Glaucoma   . Headache   . History of PCOS   . Hyperlipidemia   . Hypertension   . Inflammatory polyps of colon ([place])   . Recurrent UTI   . Thyroid disease   . UTI (urinary tract infection)     Past Surgical History:  Procedure Laterality Date  . BREAST BIOPSY  2015  . CESAREAN SECTION  2004  . INNER EAR SURGERY     ear and sinus surgery  . LAPAROSCOPIC REPAIR AND REMOVAL OF GASTRIC BAND    . OOPHORECTOMY Left   . TONSILLECTOMY AND ADENOIDECTOMY      There were no vitals filed for this visit.  Subjective Assessment - 02/07/18 1452    Subjective  Pt reports increase pain in both shoulders     Currently in Pain?  Yes    Pain Score  7     Pain Location  Shoulder    Pain Orientation  Right;Left;Posterior                       [facility] Adult PT Treatment/Exercise - 02/07/18 0001      Moist Heat Therapy   Number Minutes Moist Heat  15 Minutes    Moist Heat Location  Lumbar Spine      Electrical Stimulation   Electrical Stimulation Location  upper back    Electrical Stimulation Action  IFC    Electrical Stimulation Parameters  sitting    Electrical Stimulation Goals  Pain      Manual Therapy   Manual Therapy  Soft tissue mobilization    Soft tissue mobilization  upper trap on the right and into the rhomboid       Trigger Point Dry Needling - 02/07/18 1659    Consent Given?  Yes    Education Handout Provided  Yes    Muscles Treated Upper Body  Upper trapezius    Upper Trapezius Response  Twitch reponse elicited             PT Short Term Goals - 01/07/18 1520      PT SHORT TERM GOAL #1   Title  independent with initial HEP    Status  Achieved        PT Long Term Goals - 02/04/18 1434      PT LONG TERM GOAL #1   Title  understand proper body mechanics for ADL's    Status  Partially Met      PT LONG TERM GOAL #2   Title  increase lumbar ROM 25%  Status  Partially Met      PT LONG TERM GOAL #3   Title  decrease pain 50%    Status  Achieved      PT LONG TERM GOAL #4   Title  report able to do her normal housework without increase pain    Status  Partially Met            Plan - 02/07/18 1505    Clinical Impression Statement  Pt enters clinic reporting increase in pain across the back of her shoulders. She reports taking OTC pain meds an it not helping. She stated that has been going on on for the past two days.     Rehab Potential  Good    PT Treatment/Interventions  ADLs/Self Care Home Management;Cryotherapy;Electrical Stimulation;Moist Heat;Functional mobility training;Therapeutic activities;Therapeutic exercise;Balance training;Neuromuscular re-education;Manual techniques;Patient/family education    PT Next Visit Plan  slowly add exercises and body mechanics instruction       Patient will benefit from skilled therapeutic intervention in order to improve the following deficits and impairments:  Abnormal gait, Decreased range of motion, Difficulty walking, Increased muscle spasms, Pain, Impaired flexibility, Improper body mechanics, Decreased strength, Decreased mobility  Visit Diagnosis: Acute right-sided low back pain with  right-sided sciatica  Muscle spasm of back     Problem List Patient Active Problem List   Diagnosis Date Noted  . Weight gain 01/25/2018  . Hemorrhoids 01/05/2018  . Generalized postprandial abdominal pain 01/05/2018  . Hiatal hernia 01/05/2018  . Esophageal dysmotilities 01/05/2018  . Mixed hyperlipidemia 09/12/2017  . Multinodular goiter 07/27/2017  . Hyperparathyroidism ([place]) 06/13/2017  . Vitamin D deficiency 06/13/2017  . History of colonic polyps 05/15/2017  . Eustachian tube dysfunction, right 05/15/2017  . Lymphatic edema 05/15/2017  . Hypertension 05/14/2017  . Anemia 05/11/2017  . Arrhythmia 05/11/2017  . Diverticulosis 05/11/2017  . GERD (gastroesophageal reflux disease) 05/11/2017  . Hypothyroidism 05/11/2017  . Lower extremity edema 05/11/2017  . Morbid obesity ([place]) 05/11/2017  . Polycystic ovary syndrome 05/11/2017  . OSA on CPAP 05/11/2017  . Neck pain 05/11/2017  . Allergic rhinitis 05/11/2017  . 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

## 2018-02-07 NOTE — Patient Instructions (Signed)

## 2018-02-07 NOTE — Therapy (Signed)
[place], [place], [address] Phone: [phone]   Fax:  [phone]  Physical Therapy Treatment  Patient Details  Name: Madison Palmer MRN: [id number] Date of Birth: Oct 10, 1965 Referring Provider (PT): [name]   Encounter Date: 02/07/2018  PT End of Session - 02/07/18 1504    Visit Number  8    PT Start Time  1430    PT Stop Time  1515    PT Time Calculation (min)  45 min    Activity Tolerance  Patient limited by pain    Behavior During Therapy  [facility] for tasks assessed/performed       Past Medical History:  Diagnosis Date  . Arrhythmia    heart  . Arthritis   . Cardiomyopathy ([place])   . Chickenpox   . Depression   . Diverticulitis   . GERD (gastroesophageal reflux disease)   . Glaucoma   . Headache   . History of PCOS   . Hyperlipidemia   . Hypertension   . Inflammatory polyps of colon ([place])   . Recurrent UTI   . Thyroid disease   . UTI (urinary tract infection)     Past Surgical History:  Procedure Laterality Date  . BREAST BIOPSY  2015  . CESAREAN SECTION  2004  . INNER EAR SURGERY     ear and sinus surgery  . LAPAROSCOPIC REPAIR AND REMOVAL OF GASTRIC BAND    . OOPHORECTOMY Left   . TONSILLECTOMY AND ADENOIDECTOMY      There were no vitals filed for this visit.  Subjective Assessment - 02/07/18 1452    Subjective  Pt reports increase pain in both shoulders     Currently in Pain?  Yes    Pain Score  7     Pain Location  Shoulder    Pain Orientation  Right;Left;Posterior                       [facility] Adult PT Treatment/Exercise - 02/07/18 0001      Moist Heat Therapy   Number Minutes Moist Heat  15 Minutes    Moist Heat Location  Lumbar Spine      Electrical Stimulation   Electrical Stimulation Location  upper back    Electrical Stimulation Action  IFC    Electrical Stimulation Parameters  sitting    Electrical Stimulation Goals  Pain                PT Short Term Goals - 01/07/18 1520      PT SHORT TERM GOAL #1   Title  independent with initial HEP    Status  Achieved        PT Long Term Goals - 02/04/18 1434      PT LONG TERM GOAL #1   Title  understand proper body mechanics for ADL's    Status  Partially Met      PT LONG TERM GOAL #2   Title  increase lumbar ROM 25%    Status  Partially Met      PT LONG TERM GOAL #3   Title  decrease pain 50%    Status  Achieved      PT LONG TERM GOAL #4   Title  report able to do her normal housework without increase pain    Status  Partially Met            Plan - 02/07/18 1505  Clinical Impression Statement  Pt enters clinic reporting increase in pain across the back of her shoulders. She reports taking OTC pain med's an it not helping. She stated that has been going on for the past two days.     Rehab Potential  Good    PT Treatment/Interventions  ADLs/Self Care Home Management;Cryotherapy;Electrical Stimulation;Moist Heat;Functional mobility training;Therapeutic activities;Therapeutic exercise;Balance training;Neuromuscular re-education;Manual techniques;Patient/family education    PT Next Visit Plan  slowly add exercises and body mechanics instruction       Patient will benefit from skilled therapeutic intervention in order to improve the following deficits and impairments:  Abnormal gait, Decreased range of motion, Difficulty walking, Increased muscle spasms, Pain, Impaired flexibility, Improper body mechanics, Decreased strength, Decreased mobility  Visit Diagnosis: Acute right-sided low back pain with right-sided sciatica  Muscle spasm of back     Problem List Patient Active Problem List   Diagnosis Date Noted  . Weight gain 01/25/2018  . Hemorrhoids 01/05/2018  . Generalized postprandial abdominal pain 01/05/2018  . Hiatal hernia 01/05/2018  . Esophageal dysmotilities 01/05/2018  . Mixed hyperlipidemia 09/12/2017  . Multinodular  goiter 07/27/2017  . Hyperparathyroidism ([place]) 06/13/2017  . Vitamin D deficiency 06/13/2017  . History of colonic polyps 05/15/2017  . Eustachian tube dysfunction, right 05/15/2017  . Lymphatic edema 05/15/2017  . Hypertension 05/14/2017  . Anemia 05/11/2017  . Arrhythmia 05/11/2017  . Diverticulosis 05/11/2017  . GERD (gastroesophageal reflux disease) 05/11/2017  . Hypothyroidism 05/11/2017  . Lower extremity edema 05/11/2017  . Morbid obesity ([place]) 05/11/2017  . Polycystic ovary syndrome 05/11/2017  . OSA on CPAP 05/11/2017  . Neck pain 05/11/2017  . Allergic rhinitis 05/11/2017  . 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

## 2018-02-11 ENCOUNTER — Encounter: Payer: Self-pay | Admitting: Physical Therapy

## 2018-02-11 ENCOUNTER — Ambulatory Visit: Payer: BLUE CROSS/BLUE SHIELD | Admitting: Physical Therapy

## 2018-02-11 DIAGNOSIS — I890 Lymphedema, not elsewhere classified: Secondary | ICD-10-CM

## 2018-02-11 DIAGNOSIS — M5441 Lumbago with sciatica, right side: Secondary | ICD-10-CM

## 2018-02-11 DIAGNOSIS — M62830 Muscle spasm of back: Secondary | ICD-10-CM

## 2018-02-11 NOTE — Therapy (Signed)
[place], [place], [address] Phone: [phone]   Fax:  [phone]  Physical Therapy Treatment  Patient Details  Name: Madison Palmer MRN: [id number] Date of Birth: 12-08-1965 Referring Provider (PT): [name]   Encounter Date: 02/11/2018  PT End of Session - 02/11/18 1559    Visit Number  9    PT Start Time  6195    PT Stop Time  0932    PT Time Calculation (min)  60 min       Past Medical History:  Diagnosis Date  . Arrhythmia    heart  . Arthritis   . Cardiomyopathy ([place])   . Chickenpox   . Depression   . Diverticulitis   . GERD (gastroesophageal reflux disease)   . Glaucoma   . Headache   . History of PCOS   . Hyperlipidemia   . Hypertension   . Inflammatory polyps of colon ([place])   . Recurrent UTI   . Thyroid disease   . UTI (urinary tract infection)     Past Surgical History:  Procedure Laterality Date  . BREAST BIOPSY  2015  . CESAREAN SECTION  2004  . INNER EAR SURGERY     ear and sinus surgery  . LAPAROSCOPIC REPAIR AND REMOVAL OF GASTRIC BAND    . OOPHORECTOMY Left   . TONSILLECTOMY AND ADENOIDECTOMY      There were no vitals filed for this visit.  Subjective Assessment - 02/11/18 1525    Subjective  "The needles significantly helped"    Currently in Pain?  Yes    Pain Score  2     Pain Location  Shoulder    Pain Orientation  Right                       [facility] Adult PT Treatment/Exercise - 02/11/18 0001      Exercises   Exercises  Lumbar      Lumbar Exercises: Aerobic   UBE (Upper Arm Bike)  L x2 min each way       Lumbar Exercises: Seated   Other Seated Lumbar Exercises  HS curls yellow 2x15, Horiz abs yellow 2x10; Wt ball trunk rotations 2x10     Other Seated Lumbar Exercises  shoulder ER yellow 2x10; Rows yellow tband 2x10      Moist Heat Therapy   Number Minutes Moist Heat  15 Minutes    Moist Heat Location  Lumbar Spine      Electrical Stimulation   Electrical Stimulation Location  upper back    Electrical Stimulation Action  IFC    Electrical Stimulation Parameters  sitting    Electrical Stimulation Goals  Pain               PT Short Term Goals - 01/07/18 1520      PT SHORT TERM GOAL #1   Title  independent with initial HEP    Status  Achieved        PT Long Term Goals - 02/04/18 1434      PT LONG TERM GOAL #1   Title  understand proper body mechanics for ADL's    Status  Partially Met      PT LONG TERM GOAL #2   Title  increase lumbar ROM 25%    Status  Partially Met      PT LONG TERM GOAL #3   Title  decrease pain 50%  Status  Achieved      PT LONG TERM GOAL #4   Title  report able to do her normal housework without increase pain    Status  Partially Met            Plan - 02/11/18 1545    Clinical Impression Statement  Pt enters clinic reporting that she is feeling a lot better. Introduced her to come seated LE & UE exercises with light resistance. She did report a little pain with horizontal abduction. Pt reports a dull numbing pain in R shoulder after interventions.     Rehab Potential  Good    PT Frequency  2x / week    PT Duration  8 weeks    PT Treatment/Interventions  ADLs/Self Care Home Management;Cryotherapy;Electrical Stimulation;Moist Heat;Functional mobility training;Therapeutic activities;Therapeutic exercise;Balance training;Neuromuscular re-education;Manual techniques;Patient/family education    PT Next Visit Plan  assess Tx       Patient will benefit from skilled therapeutic intervention in order to improve the following deficits and impairments:  Abnormal gait, Decreased range of motion, Difficulty walking, Increased muscle spasms, Pain, Impaired flexibility, Improper body mechanics, Decreased strength, Decreased mobility  Visit Diagnosis: Acute right-sided low back pain with right-sided sciatica  Muscle spasm of back  Lymphedema, not elsewhere  classified     Problem List Patient Active Problem List   Diagnosis Date Noted  . Weight gain 01/25/2018  . Hemorrhoids 01/05/2018  . Generalized postprandial abdominal pain 01/05/2018  . Hiatal hernia 01/05/2018  . Esophageal dysmotilities 01/05/2018  . Mixed hyperlipidemia 09/12/2017  . Multinodular goiter 07/27/2017  . Hyperparathyroidism ([place]) 06/13/2017  . Vitamin D deficiency 06/13/2017  . History of colonic polyps 05/15/2017  . Eustachian tube dysfunction, right 05/15/2017  . Lymphatic edema 05/15/2017  . Hypertension 05/14/2017  . Anemia 05/11/2017  . Arrhythmia 05/11/2017  . Diverticulosis 05/11/2017  . GERD (gastroesophageal reflux disease) 05/11/2017  . Hypothyroidism 05/11/2017  . Lower extremity edema 05/11/2017  . Morbid obesity ([place]) 05/11/2017  . Polycystic ovary syndrome 05/11/2017  . OSA on CPAP 05/11/2017  . Neck pain 05/11/2017  . Allergic rhinitis 05/11/2017  . 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

## 2018-02-14 ENCOUNTER — Ambulatory Visit: Payer: BLUE CROSS/BLUE SHIELD | Admitting: Physical Therapy

## 2018-02-14 ENCOUNTER — Encounter (INDEPENDENT_AMBULATORY_CARE_PROVIDER_SITE_OTHER): Payer: Self-pay

## 2018-02-14 ENCOUNTER — Encounter: Payer: Self-pay | Admitting: Physical Therapy

## 2018-02-14 DIAGNOSIS — M62830 Muscle spasm of back: Secondary | ICD-10-CM

## 2018-02-14 DIAGNOSIS — M5441 Lumbago with sciatica, right side: Secondary | ICD-10-CM

## 2018-02-14 NOTE — Therapy (Signed)
Madison [place], [place], [address] Phone: [phone]   Fax:  [phone]  Physical Therapy Treatment  Patient Details  Name: LORILYNN LEHR MRN: [id number] Date of Birth: [birth year] Referring Provider (PT): [name]   Encounter Date: 02/14/2018  PT End of Session - 02/14/18 1427    Visit Number  10    Date for PT Re-Evaluation  03/04/18    PT Start Time  1350    PT Stop Time  1439    PT Time Calculation (min)  49 min    Activity Tolerance  Patient limited by pain    Behavior During Therapy  [facility] for tasks assessed/performed       Past Medical History:  Diagnosis Date  . Arrhythmia    heart  . Arthritis   . Cardiomyopathy ([place])   . Chickenpox   . Depression   . Diverticulitis   . GERD (gastroesophageal reflux disease)   . Glaucoma   . Headache   . History of PCOS   . Hyperlipidemia   . Hypertension   . Inflammatory polyps of colon ([place])   . Recurrent UTI   . Thyroid disease   . UTI (urinary tract infection)     Past Surgical History:  Procedure Laterality Date  . BREAST BIOPSY  2015  . CESAREAN SECTION  2004  . INNER EAR SURGERY     ear and sinus surgery  . LAPAROSCOPIC REPAIR AND REMOVAL OF GASTRIC BAND    . OOPHORECTOMY Left   . TONSILLECTOMY AND ADENOIDECTOMY      There were no vitals filed for this visit.  Subjective Assessment - 02/14/18 1358    Subjective  Pt reports some lower back,  R quad soreness, and R glute soreness from exercises.    Currently in Pain?  Yes    Pain Score  6     Pain Location  Back                       [facility] Adult PT Treatment/Exercise - 02/14/18 0001      Exercises   Exercises  Lumbar      Lumbar Exercises: Stretches   Passive Hamstring Stretch  Right;Left;3 reps;20 seconds   sitting.     Lumbar Exercises: Aerobic   UBE (Upper Arm Bike)  L x2 min each way       Lumbar Exercises: Seated   Other Seated Lumbar Exercises   green tband rows 2x10       Moist Heat Therapy   Number Minutes Moist Heat  15 Minutes    Moist Heat Location  Lumbar Spine      Electrical Stimulation   Electrical Stimulation Location  right SI area    Electrical Stimulation Action  IFC    Electrical Stimulation Parameters  sitting    Electrical Stimulation Goals  Pain      Manual Therapy   Manual Therapy  Soft tissue mobilization    Soft tissue mobilization  R low back paraspinales over illium               PT Short Term Goals - 01/07/18 1520      PT SHORT TERM GOAL #1   Title  independent with initial HEP    Status  Achieved        PT Long Term Goals - 02/04/18 1434      PT LONG TERM GOAL #1  Title  understand proper body mechanics for ADL's    Status  Partially Met      PT LONG TERM GOAL #2   Title  increase lumbar ROM 25%    Status  Partially Met      PT LONG TERM GOAL #3   Title  decrease pain 50%    Status  Achieved      PT LONG TERM GOAL #4   Title  report able to do her normal housework without increase pain    Status  Partially Met            Plan - 02/14/18 1430    Clinical Impression Statement  pt enters clinic reporting that her original LOB has returned, We did do a few exercises last treatment with light resistance. She did report a stretch with the seated HS curls. Good strength with seated rows. Positive response form MT.     PT Frequency  2x / week    PT Duration  8 weeks    PT Treatment/Interventions  ADLs/Self Care Home Management;Cryotherapy;Electrical Stimulation;Moist Heat;Functional mobility training;Therapeutic activities;Therapeutic exercise;Balance training;Neuromuscular re-education;Manual techniques;Patient/family education    PT Next Visit Plan  assess Tx, activity tends to cause pt pain,she reports not being active at home.        Patient will benefit from skilled therapeutic intervention in order to improve the following deficits and impairments:  Abnormal gait,  Decreased range of motion, Difficulty walking, Increased muscle spasms, Pain, Impaired flexibility, Improper body mechanics, Decreased strength, Decreased mobility, Obesity  Visit Diagnosis: Acute right-sided low back pain with right-sided sciatica  Muscle spasm of back     Problem List Patient Active Problem List   Diagnosis Date Noted  . Weight gain 01/25/2018  . Hemorrhoids 01/05/2018  . Generalized postprandial abdominal pain 01/05/2018  . Hiatal hernia 01/05/2018  . Esophageal dysmotilities 01/05/2018  . Mixed hyperlipidemia 09/12/2017  . Multinodular goiter 07/27/2017  . Hyperparathyroidism ([place]) 06/13/2017  . Vitamin D deficiency 06/13/2017  . History of colonic polyps 05/15/2017  . Eustachian tube dysfunction, right 05/15/2017  . Lymphatic edema 05/15/2017  . Hypertension 05/14/2017  . Anemia 05/11/2017  . Arrhythmia 05/11/2017  . Diverticulosis 05/11/2017  . GERD (gastroesophageal reflux disease) 05/11/2017  . Hypothyroidism 05/11/2017  . Lower extremity edema 05/11/2017  . Morbid obesity ([place]) 05/11/2017  . Polycystic ovary syndrome 05/11/2017  . OSA on CPAP 05/11/2017  . Neck pain 05/11/2017  . Allergic rhinitis 05/11/2017  . 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

## 2018-02-20 ENCOUNTER — Ambulatory Visit: Payer: BLUE CROSS/BLUE SHIELD | Admitting: Nurse Practitioner

## 2018-02-20 ENCOUNTER — Encounter: Payer: Self-pay | Admitting: Nurse Practitioner

## 2018-02-20 VITALS — BP 164/96 | HR 87 | Temp 98.7°F | Ht 64.0 in | Wt 362.0 lb

## 2018-02-20 DIAGNOSIS — Z9989 Dependence on other enabling machines and devices: Secondary | ICD-10-CM

## 2018-02-20 DIAGNOSIS — I10 Essential (primary) hypertension: Secondary | ICD-10-CM | POA: Diagnosis not present

## 2018-02-20 DIAGNOSIS — Z23 Encounter for immunization: Secondary | ICD-10-CM

## 2018-02-20 DIAGNOSIS — I119 Hypertensive heart disease without heart failure: Secondary | ICD-10-CM

## 2018-02-20 DIAGNOSIS — R600 Localized edema: Secondary | ICD-10-CM

## 2018-02-20 DIAGNOSIS — D500 Iron deficiency anemia secondary to blood loss (chronic): Secondary | ICD-10-CM | POA: Diagnosis not present

## 2018-02-20 DIAGNOSIS — R5383 Other fatigue: Secondary | ICD-10-CM

## 2018-02-20 DIAGNOSIS — M5441 Lumbago with sciatica, right side: Secondary | ICD-10-CM

## 2018-02-20 DIAGNOSIS — I890 Lymphedema, not elsewhere classified: Secondary | ICD-10-CM

## 2018-02-20 DIAGNOSIS — M47818 Spondylosis without myelopathy or radiculopathy, sacral and sacrococcygeal region: Secondary | ICD-10-CM | POA: Diagnosis not present

## 2018-02-20 DIAGNOSIS — L723 Sebaceous cyst: Secondary | ICD-10-CM

## 2018-02-20 DIAGNOSIS — G4733 Obstructive sleep apnea (adult) (pediatric): Secondary | ICD-10-CM

## 2018-02-20 NOTE — Patient Instructions (Addendum)
Stable cbc, but decrease in iron saturation. Level is not low enough to need iron infusion. Need to take oral ferrous sulfate 325mg  OTC 1tab once a day. Normal magnesium, sed rate and CRP. Pending ANA. Stable electrolyte with mild decrease in renal function. Continue HCTZ 25mg . Ok to use furosemide and potassium x 3days. Mild increase in proBNP which suggest possible fluid overload. Use furosemide and I entered order for repeat echocardiogram. Please contact Madison Palmer to follow up on back and leg pain. Hold PT till evaluated by PT.  You will be contacted to schedule appt with dermatology and lymphadema clinic.  If above labs are normal, will need to further evaluation by cardiologist. Consider repeat echo?  Maintain upcoming appt in November.

## 2018-02-20 NOTE — Progress Notes (Signed)
Subjective:  Patient ID: Madison Palmer, female    DOB: Oct 07, 1965  Age: 52 y.o. MRN: [id number]  CC: Follow-up (pt still in pain after doing exercises with PT. flu shot?) and Follow-up (pt is concern about fluid retention , not urine as much. heart care from [place] suggest taking hydrocho 50 mg and taking lasix. referral for lymphedema? )  Rash  This is a new problem. The current episode started in the past 7 days. The problem has been gradually improving since onset. The affected locations include the back. The rash is characterized by itchiness, burning, pain and swelling. It is unknown if there was an exposure to a precipitant. Past treatments include antibiotic cream and antibiotics. The treatment provided moderate relief.  abscess treated with oral abx.  HTN: Improving Current use of HCTZ and hydralazine. HCTZ was increased to 50mg  for few days by cardiology, then swtiched to furosemide. Home BP readings: 11/2017: 140-150/80-90 12/2017: 130-140/90s 01/2018: 120-130/90s  Complains of increased fatigue, worsening LE edema and weight gain. No change in edema with increased dose of HCTZ. Has not taken furosemide 40mg  and potassium 4mEq prescribed by cardiology. Also has non productive cough and SOB upon exertion. Weight gain of 20lbs since 11/2017. Wt Readings from Last 3 Encounters:  02/20/18 (!) 362 lb (164.2 kg)  01/25/18 (!) 356 lb 12.8 oz (161.8 kg)  01/14/18 (!) 354 lb 12.8 oz (160.9 kg)  she will like another referral to lymphedema clinic. Reports she is compliant with CPAP machine, managed by Dr.Alva, last seen 12/2017: CPAP compliance documented.  worsening back and leg pain despite PT x 6weeks, use of meloxicam and flexeril.  Reviewed past Medical, Social and Family history today.  Outpatient Medications Prior to Visit  Medication Sig Dispense Refill  . Acetaminophen (TYLENOL EXTRA STRENGTH PO) Take by mouth. Takes 2 tablets 2-3 times a day.    Marland Kitchen atenolol  (TENORMIN) 25 MG tablet TK 1 T PO D  0  . ergocalciferol (VITAMIN D2) 50000 units capsule Take 1 capsule (50,000 Units total) by mouth 3 (three) times a week. 12 capsule 0  . esomeprazole (NEXIUM) 40 MG capsule Take 1 capsule (40 mg total) by mouth 2 (two) times daily. 180 capsule 0  . fluticasone (FLONASE) 50 MCG/ACT nasal spray Place 2 sprays into both nostrils daily.    . hydrALAZINE (APRESOLINE) 50 MG tablet Take 1tab in morning and afternoon, and 2tabs at bedtime 120 tablet 6  . hydrochlorothiazide (HYDRODIURIL) 25 MG tablet Take 1 tablet (25 mg total) by mouth daily. 90 tablet 1  . ibuprofen (ADVIL,MOTRIN) 600 MG tablet Take 600 mg by mouth every 6 (six) hours as needed.    Marland Kitchen levothyroxine (SYNTHROID, LEVOTHROID) 75 MCG tablet TK 1 T PO QD  MONDAY-FRIDAY  0  . omega-3 fish oil (MAXEPA) 1000 MG CAPS capsule Take 2 capsules (2,000 mg total) by mouth 2 (two) times daily. (Patient not taking: Reported on 01/25/2018) 90 each   . spironolactone (ALDACTONE) 25 MG tablet Take 1 tablet (25 mg total) by mouth daily. (Patient not taking: Reported on 01/25/2018) 30 tablet 0   No facility-administered medications prior to visit.     ROS See HPI  Objective:  BP (!) 164/96   Pulse 87   Temp 98.7 F (37.1 C) (Oral)   Ht 5\' 4"  (1.626 m)   Wt (!) 362 lb (164.2 kg)   SpO2 90%   BMI 62.14 kg/m   BP Readings from Last 3 Encounters:  02/20/18 (!) 164/96  01/25/18 (!) 152/100  01/14/18 (!) 144/82    Wt Readings from Last 3 Encounters:  02/20/18 (!) 362 lb (164.2 kg)  01/25/18 (!) 356 lb 12.8 oz (161.8 kg)  01/14/18 (!) 354 lb 12.8 oz (160.9 kg)    Physical Exam  Constitutional: She appears well-developed and well-nourished.  Cardiovascular: Normal rate and regular rhythm.  Murmur heard. Pulmonary/Chest: Effort normal.  Diminished lung sounds in bases due to body habitus.   Musculoskeletal: She exhibits edema. She exhibits no tenderness.  Skin: No erythema.     Vitals  reviewed.   Lab Results  Component Value Date   WBC 7.0 02/20/2018   HGB 15.1 (H) 02/20/2018   HCT 46.5 (H) 02/20/2018   PLT 280.0 02/20/2018   GLUCOSE 115 (H) 02/20/2018   CHOL 216 (H) 09/10/2017   TRIG 133.0 09/10/2017   HDL 46.60 09/10/2017   LDLCALC 142 (H) 09/10/2017   ALT 15 09/10/2017   AST 13 09/10/2017   NA 140 02/20/2018   K 4.2 02/20/2018   CL 96 02/20/2018   CREATININE 1.08 02/20/2018   BUN 16 02/20/2018   CO2 36 (H) 02/20/2018   TSH 0.97 12/11/2017   HGBA1C 5.7 09/10/2017    Dg Lumbar Spine Complete  Result Date: 12/24/2017 CLINICAL DATA:  RIGHT low back pain radiating to groin for 1 week. No injury. EXAM: LUMBAR SPINE - COMPLETE 4+ VIEW COMPARISON:  None. FINDINGS: Five non rib-bearing lumbar-type vertebral bodies are intact. Minimal grade 1 L4-5 anterolisthesis. No spondylolysis. Maintained lumbar lordosis. Moderate L5-S1 disc height loss with endplate sclerosis and marginal spurring compatible with degenerative disc. No destructive bony lesions. Severe lower lumbar facet arthropathy. Severe RIGHT and moderate LEFT sacroiliac osteoarthrosis. Included prevertebral and paraspinal soft tissue planes are non-suspicious. Gluteal injection granulomas. IMPRESSION: 1. No acute fracture deformity. Minimal grade 1 L4-5 anterolisthesis without spondylolysis. 2. Moderate L5-S1 degenerative disc with severe lower lumbar facet arthropathy. 3. Severe RIGHT sacroiliac osteoarthrosis. Electronically Signed   By: Elon Alas M.D.   On: 12/24/2017 22:42   Dg Hips Bilat With Pelvis 3-4 Views  Result Date: 12/24/2017 CLINICAL DATA:  RIGHT low back pain radiating to groin. EXAM: DG HIP (WITH OR WITHOUT PELVIS) 3-4V BILAT COMPARISON:  None. FINDINGS: Please note, RIGHT and LEFT markers are digitally placed. Femoral heads are well formed and located. Hip joint spaces are intact, mild osteoarthrosis with superolateral acetabular spurring. Severe RIGHT sacroiliac osteoarthrosis, mild on  the LEFT. Degenerative change of the lumbar spine, please see today's dedicated radiograph. No destructive bony lesions. Included soft tissue planes are non-suspicious. IMPRESSION: 1. No fracture deformity or dislocation. 2. Mild bilateral hip osteoarthrosis. 3. Severe RIGHT sacroiliac osteoarthrosis. Electronically Signed   By: Elon Alas M.D.   On: 12/24/2017 16:53    Assessment & Plan:   [place] was seen today for follow-up and follow-up.  Diagnoses and all orders for this visit:  Arthritis of sacroiliac joint  Acute right-sided low back pain with right-sided sciatica  Bilateral leg edema -     B Nat Peptide -     Basic metabolic panel -     Ambulatory referral to Occupational Therapy -     ECHOCARDIOGRAM COMPLETE; Future -     Ambulatory referral to Cardiology  Fatigue, unspecified type -     B Nat Peptide -     Magnesium -     Sedimentation rate -     C-reactive protein -     Antinuclear Antib (ANA) -  B12 and Folate Panel  Essential hypertension -     Basic metabolic panel -     ECHOCARDIOGRAM COMPLETE; Future -     Ambulatory referral to Cardiology  Sebaceous cyst -     Ambulatory referral to Dermatology  Iron deficiency anemia due to chronic blood loss -     IBC panel -     CBC w/Diff  Lymphatic edema -     Ambulatory referral to Occupational Therapy  Need for influenza vaccination -     Flu Vaccine QUAD 36+ mos IM  OSA on CPAP -     ECHOCARDIOGRAM COMPLETE; Future -     Ambulatory referral to Cardiology  Hypertensive heart disease, unspecified whether heart failure present -     ECHOCARDIOGRAM COMPLETE; Future -     Ambulatory referral to Cardiology   I am having Aava L. Capers maintain her atenolol, ibuprofen, fluticasone, Acetaminophen (TYLENOL EXTRA STRENGTH PO), omega-3 fish oil, spironolactone, hydrALAZINE, hydrochlorothiazide, esomeprazole, levothyroxine, and ergocalciferol.  No orders of the defined types were placed in this  encounter.   Follow-up: No follow-ups on file.  Wilfred Lacy, NP

## 2018-02-21 LAB — IBC PANEL
Iron: 44 ug/dL (ref 42–145)
Saturation Ratios: 8.8 % — ABNORMAL LOW (ref 20.0–50.0)
Transferrin: 356 mg/dL (ref 212.0–360.0)

## 2018-02-21 LAB — BASIC METABOLIC PANEL
BUN: 16 mg/dL (ref 6–23)
CALCIUM: 10.6 mg/dL — AB (ref 8.4–10.5)
CHLORIDE: 96 meq/L (ref 96–112)
CO2: 36 meq/L — AB (ref 19–32)
Creatinine, Ser: 1.08 mg/dL (ref 0.40–1.20)
GFR: 56.65 mL/min — ABNORMAL LOW (ref >60.00)
Glucose, Bld: 115 mg/dL — ABNORMAL HIGH (ref 70–99)
Potassium: 4.2 mEq/L (ref 3.5–5.1)
Sodium: 140 mEq/L (ref 135–145)

## 2018-02-21 LAB — CBC WITH DIFFERENTIAL/PLATELET
BASOS ABS: 0.1 10*3/uL (ref 0.0–0.1)
Basophils Relative: 1.2 % (ref 0.0–3.0)
EOS ABS: 0 10*3/uL (ref 0.0–0.7)
Eosinophils Relative: 0.7 % (ref 0.0–5.0)
HEMATOCRIT: 46.5 % — AB (ref 36.0–46.0)
Hemoglobin: 15.1 g/dL — ABNORMAL HIGH (ref 12.0–15.0)
LYMPHS ABS: 1 10*3/uL (ref 0.7–4.0)
LYMPHS PCT: 14.2 % (ref 12.0–46.0)
MCHC: 32.4 g/dL (ref 30.0–36.0)
MCV: 85.1 fl (ref 78.0–100.0)
Monocytes Absolute: 0.4 10*3/uL (ref 0.1–1.0)
Monocytes Relative: 5.1 % (ref 3.0–12.0)
NEUTROS ABS: 5.5 10*3/uL (ref 1.4–7.7)
NEUTROS PCT: 78.8 % — AB (ref 43.0–77.0)
PLATELETS: 280 10*3/uL (ref 150.0–400.0)
RBC: 5.46 Mil/uL — ABNORMAL HIGH (ref 3.87–5.11)
RDW: 15.7 % — ABNORMAL HIGH (ref 11.5–15.5)
WBC: 7 10*3/uL (ref 4.0–10.5)

## 2018-02-21 LAB — B12 AND FOLATE PANEL
FOLATE: 10.4 ng/mL (ref >5.9)
Vitamin B-12: 263 pg/mL (ref 211–911)

## 2018-02-21 LAB — BRAIN NATRIURETIC PEPTIDE: Pro B Natriuretic peptide (BNP): 173 pg/mL — ABNORMAL HIGH (ref 0.0–100.0)

## 2018-02-21 LAB — SEDIMENTATION RATE: SED RATE: 19 mm/h (ref 0–30)

## 2018-02-21 LAB — MAGNESIUM: Magnesium: 2.2 mg/dL (ref 1.5–2.5)

## 2018-02-21 LAB — C-REACTIVE PROTEIN: CRP: 0.2 mg/dL — AB (ref 0.5–20.0)

## 2018-02-22 ENCOUNTER — Encounter: Payer: Self-pay | Admitting: Nurse Practitioner

## 2018-02-22 ENCOUNTER — Ambulatory Visit: Payer: BLUE CROSS/BLUE SHIELD | Admitting: Physical Therapy

## 2018-02-22 ENCOUNTER — Encounter: Payer: Self-pay | Admitting: Physical Therapy

## 2018-02-22 DIAGNOSIS — R5383 Other fatigue: Secondary | ICD-10-CM

## 2018-02-22 DIAGNOSIS — R768 Other specified abnormal immunological findings in serum: Secondary | ICD-10-CM

## 2018-02-22 LAB — ANTI-NUCLEAR AB-TITER (ANA TITER): ANA Titer 1: 1:40 {titer} — ABNORMAL HIGH

## 2018-02-22 LAB — ANA: Anti Nuclear Antibody(ANA): POSITIVE — AB

## 2018-02-26 ENCOUNTER — Ambulatory Visit: Payer: BLUE CROSS/BLUE SHIELD | Admitting: Gastroenterology

## 2018-02-27 ENCOUNTER — Encounter (INDEPENDENT_AMBULATORY_CARE_PROVIDER_SITE_OTHER): Payer: Self-pay | Admitting: Family Medicine

## 2018-02-27 ENCOUNTER — Other Ambulatory Visit (INDEPENDENT_AMBULATORY_CARE_PROVIDER_SITE_OTHER): Payer: Self-pay | Admitting: Family Medicine

## 2018-02-27 ENCOUNTER — Ambulatory Visit (INDEPENDENT_AMBULATORY_CARE_PROVIDER_SITE_OTHER): Payer: BLUE CROSS/BLUE SHIELD | Admitting: Family Medicine

## 2018-02-27 VITALS — BP 172/98 | HR 89 | Temp 98.0°F | Ht 64.0 in | Wt 357.0 lb

## 2018-02-27 DIAGNOSIS — Z9189 Other specified personal risk factors, not elsewhere classified: Secondary | ICD-10-CM

## 2018-02-27 DIAGNOSIS — R0602 Shortness of breath: Secondary | ICD-10-CM | POA: Diagnosis not present

## 2018-02-27 DIAGNOSIS — Z1331 Encounter for screening for depression: Secondary | ICD-10-CM | POA: Diagnosis not present

## 2018-02-27 DIAGNOSIS — E213 Hyperparathyroidism, unspecified: Secondary | ICD-10-CM

## 2018-02-27 DIAGNOSIS — Z6841 Body Mass Index (BMI) 40.0 and over, adult: Secondary | ICD-10-CM

## 2018-02-27 DIAGNOSIS — Z0289 Encounter for other administrative examinations: Secondary | ICD-10-CM

## 2018-02-27 DIAGNOSIS — I10 Essential (primary) hypertension: Secondary | ICD-10-CM

## 2018-02-27 DIAGNOSIS — R7303 Prediabetes: Secondary | ICD-10-CM

## 2018-02-27 DIAGNOSIS — R5383 Other fatigue: Secondary | ICD-10-CM | POA: Diagnosis not present

## 2018-02-27 MED ORDER — AMLODIPINE BESYLATE 5 MG PO TABS: 5.0000 mg | ORAL_TABLET | Freq: Every day | ORAL | 0 refills | Status: DC

## 2018-02-27 NOTE — Progress Notes (Signed)
Office: [phone]  /  Fax: [phone]   Dear Flossie Buffy, NP,   Thank you for referring [name] to our clinic. The following note includes my evaluation and treatment recommendations.  HPI:   Chief Complaint: OBESITY    [name] has been referred by Flossie Buffy, NP for consultation regarding her obesity and obesity related comorbidities.    MAIZE BRITTINGHAM (MR# [id number]) is a 52 y.o. female who presents on 02/27/2018 for obesity evaluation and treatment. Current BMI is Body mass index is 61.28 kg/m.Marland Kitchen Lashaunta has been struggling with her weight for many years and has been unsuccessful in either losing weight, maintaining weight loss, or reaching her healthy weight goal.     Zakirah attended our information session and states she is currently in the action stage of change and ready to dedicate time achieving and maintaining a healthier weight. Gloriana is interested in becoming our patient and working on intensive lifestyle modifications including (but not limited to) diet, exercise and weight loss.    Josceline states her family eats meals together she thinks her family will eat healthier with  her her desired weight loss is 137-157 lbs she has been heavy most of  her life she started gaining weight between 1995-2002 her heaviest weight ever was 364 lbs she is a picky eater and doesn't like to eat healthier foods  she has significant food cravings issues  she skips meals frequently she is frequently drinking liquids with calories she frequently makes poor food choices she frequently eats larger portions than normal  she struggles with emotional eating    Fatigue Tishia feels her energy is lower than it should be. This has worsened with weight gain and has not worsened recently. Shalah admits to daytime somnolence and  denies waking up still tired. Patient is at risk for obstructive sleep apnea. Patent has a history of symptoms of daytime fatigue. Patient  generally gets 9 hours of sleep per night, and states they generally have nightime awakenings and generally restful sleep. Snoring is present. Apneic episodes are not present. Epworth Sleepiness Score is 4.  Dyspnea on exertion Juline notes increasing shortness of breath with exercising and seems to be worsening over time with weight gain. She notes getting out of breath sooner with activity than she used to. This has not gotten worse recently. EKG-trigeminy. Julena denies orthopnea.  Hypertension KARLYE IHRIG is a 52 y.o. female with hypertension. Oletta's blood pressure is uncontrolled today, EKG shows trigeminy. She denies chest pain. She is working weight loss to help control her blood pressure with the goal of decreasing her risk of heart attack and stroke.   At risk for cardiovascular disease Kodi is at a higher than average risk for cardiovascular disease due to obesity and hypertension. She currently denies any chest pain.  Hyperparathyroidism Katyra has a diagnosis of hyperparathyroidism. She is on levothyroxine and her Ca is elevated. She notes hot or cold intolerance and fatigue.  Pre-Diabetes Jovita has a diagnosis of pre-diabetes based on her elevated Hgb A1c and was informed this puts her at greater risk of developing diabetes. Her Hgb A1c was of 6.0 on 01/26/18, she is not on medications and would like to work on diet and exercise to decrease risk of diabetes. She denies nausea or hypoglycemia.  Depression Screen Deanna's Food and Mood (modified PHQ-9) score was  Depression screen PHQ 2/9 02/27/2018  Decreased Interest 1  Down, Depressed, Hopeless 1  PHQ - 2 Score  2  Altered sleeping 0  Tired, decreased energy 2  Change in appetite 0  Feeling bad or failure about yourself  1  Trouble concentrating 0  Moving slowly or fidgety/restless 0  Suicidal thoughts 0  PHQ-9 Score 5    ALLERGIES: Allergies  Allergen Reactions  . Fentanyl Hives  . Levofloxacin Hives  .  Midazolam Hives  . Pollen Extract     seasonal seasonal seasonal  . Atorvastatin     Muscle pain in legs   . Rosuvastatin     Abdominal pain    MEDICATIONS: Current Outpatient Medications on File Prior to Visit  Medication Sig Dispense Refill  . Acetaminophen (TYLENOL EXTRA STRENGTH PO) Take by mouth. Takes 2 tablets 2-3 times a day.    Marland Kitchen atenolol (TENORMIN) 25 MG tablet TK 1 T PO D  0  . ergocalciferol (VITAMIN D2) 50000 units capsule Take 1 capsule (50,000 Units total) by mouth 3 (three) times a week. 12 capsule 0  . esomeprazole (NEXIUM) 40 MG capsule Take 1 capsule (40 mg total) by mouth 2 (two) times daily. 180 capsule 0  . fluticasone (FLONASE) 50 MCG/ACT nasal spray Place 2 sprays into both nostrils daily.    . furosemide (LASIX) 40 MG tablet Take 40 mg by mouth. Take as needed for 3 days    . hydrALAZINE (APRESOLINE) 50 MG tablet Take 1tab in morning and afternoon, and 2tabs at bedtime 120 tablet 6  . hydrochlorothiazide (HYDRODIURIL) 25 MG tablet Take 1 tablet (25 mg total) by mouth daily. 90 tablet 1  . ibuprofen (ADVIL,MOTRIN) 600 MG tablet Take 600 mg by mouth every 6 (six) hours as needed.    Marland Kitchen levothyroxine (SYNTHROID, LEVOTHROID) 75 MCG tablet TK 1 T PO QD  MONDAY-FRIDAY  0  . potassium chloride (KLOR-CON) 20 MEQ packet Take by mouth. Take as needed for 3 days    . sulfamethoxazole-trimethoprim (BACTRIM DS,SEPTRA DS) 800-160 MG tablet Take 1 tablet by mouth 2 (two) times daily.    Marland Kitchen omega-3 fish oil (MAXEPA) 1000 MG CAPS capsule Take 2 capsules (2,000 mg total) by mouth 2 (two) times daily. (Patient not taking: Reported on 01/25/2018) 90 each   . spironolactone (ALDACTONE) 25 MG tablet Take 1 tablet (25 mg total) by mouth daily. (Patient not taking: Reported on 01/25/2018) 30 tablet 0   No current facility-administered medications on file prior to visit.     PAST MEDICAL HISTORY: Past Medical History:  Diagnosis Date  . Anemia   . Anxiety   . Arrhythmia    heart  .  Arthritis   . Cardiomyopathy ([place])   . Chickenpox   . Depression   . Diverticulitis   . Fluid retention   . GERD (gastroesophageal reflux disease)   . Glaucoma   . Headache   . History of PCOS   . Hyperlipidemia   . Hyperparathyroidism ([place])   . Hypertension   . Inflammatory polyps of colon ([place])   . Lupus ([place])   . Lymphedema   . PCOS (polycystic ovarian syndrome)   . Prediabetes   . Recurrent UTI   . Sleep apnea   . SOB (shortness of breath)   . Swallowing difficulty   . Thyroid disease   . UTI (urinary tract infection)   . Vitamin D deficiency     PAST SURGICAL HISTORY: Past Surgical History:  Procedure Laterality Date  . BREAST BIOPSY  2015  . CESAREAN SECTION  2004  . INNER EAR SURGERY  ear and sinus surgery  . LAPAROSCOPIC REPAIR AND REMOVAL OF GASTRIC BAND    . OOPHORECTOMY Left   . TONSILLECTOMY AND ADENOIDECTOMY      SOCIAL HISTORY: Social History   Tobacco Use  . Smoking status: Never Smoker  . Smokeless tobacco: Never Used  Substance Use Topics  . Alcohol use: Yes    Comment: social  . Drug use: No    FAMILY HISTORY: Family History  Adopted: Yes    ROS: Review of Systems  Constitutional: Positive for chills, fever and malaise/fatigue. Negative for weight loss.       + Trouble sleeping + Breasts pain  HENT: Positive for sinus pain.        + Decreased hearing + Nasal stuffiness + Nasal discharge  Eyes:       + Wear glasses or contacts  Respiratory: Positive for cough and shortness of breath (with exertion).   Cardiovascular: Negative for chest pain and orthopnea.  Gastrointestinal: Positive for heartburn. Negative for nausea.  Musculoskeletal: Positive for back pain.       + Muscle or joint pain  Skin: Positive for itching.       + Dryness  Neurological: Positive for headaches.  Endo/Heme/Allergies:       + Heat/cold intolerance Negative hypoglycemia  Psychiatric/Behavioral:       + Stress    PHYSICAL EXAM: Blood pressure  (!) 172/98, pulse 89, temperature 98 F (36.7 C), temperature source Oral, height 5\' 4"  (1.626 m), weight (!) 357 lb (161.9 kg), SpO2 90 %. Body mass index is 61.28 kg/m. Physical Exam  Constitutional: She is oriented to person, place, and time. She appears well-developed and well-nourished.  HENT:  Head: Normocephalic and atraumatic.  Nose: Nose normal.  Eyes: EOM are normal. No scleral icterus.  Neck: Normal range of motion. Neck supple. No thyromegaly present.  Cardiovascular: Normal rate and regular rhythm.  Murmur (2/6 systolic murmur heard best at tricuspid) heard. Pulmonary/Chest: Effort normal. No respiratory distress.  Abdominal: Soft. There is no tenderness.  + Obesity  Musculoskeletal:  Range of Motion normal in all 4 extremities Trace edema noted in bilateral lower extremities  Neurological: She is alert and oriented to person, place, and time. Coordination normal.  Skin: Skin is warm and dry.  Psychiatric: She has a normal mood and affect. Her behavior is normal.  Vitals reviewed.   RECENT LABS AND TESTS: BMET    Component Value Date/Time   NA 140 02/20/2018 1536   NA 144 05/29/2017   K 4.2 02/20/2018 1536   CL 96 02/20/2018 1536   CO2 36 (H) 02/20/2018 1536   GLUCOSE 115 (H) 02/20/2018 1536   BUN 16 02/20/2018 1536   BUN 16 05/29/2017   CREATININE 1.08 02/20/2018 1536   CREATININE 0.94 12/21/2017 1640   CALCIUM 10.6 (H) 02/20/2018 1536   Lab Results  Component Value Date   HGBA1C 5.7 09/10/2017   No results found for: INSULIN CBC    Component Value Date/Time   WBC 7.0 02/20/2018 1536   RBC 5.46 (H) 02/20/2018 1536   HGB 15.1 (H) 02/20/2018 1536   HCT 46.5 (H) 02/20/2018 1536   PLT 280.0 02/20/2018 1536   MCV 85.1 02/20/2018 1536   MCHC 32.4 02/20/2018 1536   RDW 15.7 (H) 02/20/2018 1536   LYMPHSABS 1.0 02/20/2018 1536   MONOABS 0.4 02/20/2018 1536   EOSABS 0.0 02/20/2018 1536   BASOSABS 0.1 02/20/2018 1536   Iron/TIBC/Ferritin/ %Sat      Component Value  Date/Time   IRON 44 02/20/2018 1536   IRON 81 05/29/2017   TIBC 347 05/29/2017   IRONPCTSAT 8.8 (L) 02/20/2018 1536   Lipid Panel     Component Value Date/Time   CHOL 216 (H) 09/10/2017 1156   TRIG 133.0 09/10/2017 1156   HDL 46.60 09/10/2017 1156   CHOLHDL 5 09/10/2017 1156   VLDL 26.6 09/10/2017 1156   LDLCALC 142 (H) 09/10/2017 1156   Hepatic Function Panel     Component Value Date/Time   PROT 7.2 09/10/2017 1156   ALBUMIN 4.0 09/10/2017 1156   AST 13 09/10/2017 1156   ALT 15 09/10/2017 1156   ALKPHOS 87 09/10/2017 1156   BILITOT 0.9 09/10/2017 1156      Component Value Date/Time   TSH 0.97 12/11/2017 1158   TSH 1.28 05/29/2017    ECG  shows NSR with a rate of 90 BPM INDIRECT CALORIMETER done today shows a VO2 of 267 and a REE of 1857.  Her calculated basal metabolic rate is 3267 thus her basal metabolic rate is better than expected.    ASSESSMENT AND PLAN: Other fatigue - Plan: EKG 12-Lead  Shortness of breath on exertion - Plan: Lipid Panel With LDL/HDL Ratio  Essential hypertension - Plan: amLODipine (NORVASC) 5 MG tablet  Hyperparathyroidism ([place]) - Plan: T3, T4, free, TSH  Prediabetes - Plan: Comprehensive metabolic panel, Insulin, random  Depression screening  At risk for heart disease  Class 3 severe obesity with serious comorbidity and body mass index (BMI) of 60.0 to 69.9 in adult, unspecified obesity type ([place])  PLAN:  Fatigue Amellia was informed that her fatigue may be related to obesity, depression or many other causes. Labs will be ordered, and in the meanwhile Dandra has agreed to work on diet, exercise and weight loss to help with fatigue. Proper sleep hygiene was discussed including the need for 7-8 hours of quality sleep each night. A sleep study was not ordered based on symptoms and Epworth score.  Dyspnea on exertion Phelan's shortness of breath appears to be obesity related and exercise induced. She has agreed to  work on weight loss and gradually increase exercise to treat her exercise induced shortness of breath. If Britnie follows our instructions and loses weight without improvement of her shortness of breath, we will plan to refer to pulmonology. We will monitor this condition regularly. Laticia agrees to this plan.  Hypertension We discussed sodium restriction, working on healthy weight loss, and a regular exercise program as the means to achieve improved blood pressure control. Tanishka agreed with this plan and agreed to follow up as directed. We will continue to monitor her blood pressure as well as her progress with the above lifestyle modifications. Kameela agrees to start amlodipine 5 mg PO daily #30 with no refills. She will watch for signs of hypotension as she continues her lifestyle modifications. We will send a referral to Cardiology and Carlen agrees to follow up with our clinic in 2 weeks.  Cardiovascular risk counselling Myrella was given extended (15 minutes) coronary artery disease prevention counseling today. She is 52 y.o. female and has risk factors for heart disease including obesity and hypertension. We discussed intensive lifestyle modifications today with an emphasis on specific weight loss instructions and strategies. Pt was also informed of the importance of increasing exercise and decreasing saturated fats to help prevent heart disease.  Hyperparathyroidism Marquasha was informed of the importance of good thyroid control to help with weight loss efforts. She was also informed that  supertheraputic thyroid levels are dangerous and will not improve weight loss results. We will send a referral to Edmonia James, MD at [facility]. We will check labs and Jamesia agrees to follow up with our clinic in 2 weeks.  Pre-Diabetes Delmar will continue to work on weight loss, exercise, and decreasing simple carbohydrates in her diet to help decrease the risk of diabetes. We dicussed metformin including  benefits and risks. She was informed that eating too many simple carbohydrates or too many calories at one sitting increases the likelihood of GI side effects. Amaka declined metformin for now and a prescription was not written today. We will check labs and Kathlyn agrees to follow up with our clinic in 2 weeks as directed to monitor her progress.  Depression Screen Laurelyn had a mildly positive depression screening. Depression is commonly associated with obesity and often results in emotional eating behaviors. We will monitor this closely and work on CBT to help improve the non-hunger eating patterns. Referral to Psychology may be required if no improvement is seen as she continues in our clinic.  Obesity Jeannemarie is currently in the action stage of change and her goal is to continue with weight loss efforts. I recommend Carine begin the structured treatment plan as follows:  She has agreed to follow the Category 3 plan Kaede has been instructed to eventually work up to a goal of 150 minutes of combined cardio and strengthening exercise per week for weight loss and overall health benefits. We discussed the following Behavioral Modification Strategies today: increasing lean protein intake, increasing vegetables, work on meal planning and easy cooking plans, and planning for success   She was informed of the importance of frequent follow up visits to maximize her success with intensive lifestyle modifications for her multiple health conditions. She was informed we would discuss her lab results at her next visit unless there is a critical issue that needs to be addressed sooner. Geneva agreed to keep her next visit at the agreed upon time to discuss these results.    OBESITY BEHAVIORAL INTERVENTION VISIT  Today's visit was # 1   Starting weight: 357 lbs Starting date: 02/27/18 Today's weight : 357 lbs  Today's date: 02/27/2018 Total lbs lost to date: 0    ASK: We discussed the diagnosis of  obesity with [name] today and Susannah agreed to give [place] permission to discuss obesity behavioral modification therapy today.  ASSESS: [name] has the diagnosis of obesity and her BMI today is 61.25 Lugene is in the action stage of change   ADVISE: Sanda was educated on the multiple health risks of obesity as well as the benefit of weight loss to improve her health. She was advised of the need for long term treatment and the importance of lifestyle modifications to improve her current health and to decrease her risk of future health problems.  AGREE: Multiple dietary modification options and treatment options were discussed and  Lizania agreed to follow the recommendations documented in the above note.  ARRANGE: Charnetta was educated on the importance of frequent visits to treat obesity as outlined per CMS and USPSTF guidelines and agreed to schedule her next follow up appointment today.  I, Trixie Dredge, am acting as transcriptionist for Ilene Qua, MD   I have reviewed the above documentation for accuracy and completeness, and I agree with the above. - Ilene Qua, MD

## 2018-02-28 ENCOUNTER — Encounter: Payer: Self-pay | Admitting: Nurse Practitioner

## 2018-02-28 ENCOUNTER — Encounter (INDEPENDENT_AMBULATORY_CARE_PROVIDER_SITE_OTHER): Payer: Self-pay | Admitting: Family Medicine

## 2018-02-28 ENCOUNTER — Ambulatory Visit: Payer: BLUE CROSS/BLUE SHIELD | Attending: Nurse Practitioner | Admitting: Occupational Therapy

## 2018-02-28 ENCOUNTER — Encounter: Payer: Self-pay | Admitting: Occupational Therapy

## 2018-02-28 DIAGNOSIS — I890 Lymphedema, not elsewhere classified: Secondary | ICD-10-CM | POA: Insufficient documentation

## 2018-02-28 LAB — LIPID PANEL WITH LDL/HDL RATIO
Cholesterol, Total: 196 mg/dL (ref 100–199)
HDL: 46 mg/dL (ref >39)
LDL CALC: 122 mg/dL — AB (ref 0–99)
LDL/HDL RATIO: 2.7 ratio (ref 0.0–3.2)
Triglycerides: 142 mg/dL (ref 0–149)
VLDL Cholesterol Cal: 28 mg/dL (ref 5–40)

## 2018-02-28 LAB — COMPREHENSIVE METABOLIC PANEL
A/G RATIO: 1.7 (ref 1.2–2.2)
ALT: 27 IU/L (ref 0–32)
AST: 22 IU/L (ref 0–40)
Albumin: 4.3 g/dL (ref 3.5–5.5)
Alkaline Phosphatase: 100 IU/L (ref 39–117)
BUN / CREAT RATIO: 15 (ref 9–23)
BUN: 15 mg/dL (ref 6–24)
Bilirubin Total: 0.9 mg/dL (ref 0.0–1.2)
CALCIUM: 10.6 mg/dL — AB (ref 8.7–10.2)
CO2: 29 mmol/L (ref 20–29)
Chloride: 94 mmol/L — ABNORMAL LOW (ref 96–106)
Creatinine, Ser: 1.02 mg/dL — ABNORMAL HIGH (ref 0.57–1.00)
GFR, EST AFRICAN AMERICAN: 74 mL/min/{1.73_m2} (ref >59)
GFR, EST NON AFRICAN AMERICAN: 64 mL/min/{1.73_m2} (ref >59)
GLOBULIN, TOTAL: 2.6 g/dL (ref 1.5–4.5)
Glucose: 100 mg/dL — ABNORMAL HIGH (ref 65–99)
POTASSIUM: 3.9 mmol/L (ref 3.5–5.2)
SODIUM: 142 mmol/L (ref 134–144)
TOTAL PROTEIN: 6.9 g/dL (ref 6.0–8.5)

## 2018-02-28 LAB — HEMOGLOBIN A1C
Est. average glucose Bld gHb Est-mCnc: 126 mg/dL
Hgb A1c MFr Bld: 6 % — ABNORMAL HIGH (ref 4.8–5.6)

## 2018-02-28 LAB — TSH: TSH: 1.45 u[IU]/mL (ref 0.450–4.500)

## 2018-02-28 LAB — T4, FREE: FREE T4: 1.68 ng/dL (ref 0.82–1.77)

## 2018-02-28 LAB — INSULIN, RANDOM: INSULIN: 9.6 u[IU]/mL (ref 2.6–24.9)

## 2018-02-28 LAB — T3: T3, Total: 91 ng/dL (ref 71–180)

## 2018-02-28 NOTE — Therapy (Addendum)
[place] MAIN [facility] SERVICES [address] [place], [place], [address] Phone: [phone]   Fax:  [phone]  Occupational Therapy Evaluation  Patient Details  Name: Madison Palmer MRN: [id number] Date of Birth: 10-10-1965 No data recorded  Encounter Date: 02/28/2018  OT End of Session - 02/28/18 1211    Visit Number  1    Number of Visits  4    Date for OT Re-Evaluation  05/29/18    OT Start Time  1100    OT Stop Time  1200    OT Time Calculation (min)  60 min    Activity Tolerance  Patient tolerated treatment well;No increased pain;Patient limited by pain;Treatment limited secondary to medical complications (Comment)    Behavior During Therapy  [facility] for tasks assessed/performed       Past Medical History:  Diagnosis Date  . Anemia   . Anxiety   . Arrhythmia    heart  . Arthritis   . Cardiomyopathy ([place])   . Chickenpox   . Depression   . Diverticulitis   . Fluid retention   . GERD (gastroesophageal reflux disease)   . Glaucoma   . Headache   . History of PCOS   . Hyperlipidemia   . Hyperparathyroidism ([place])   . Hypertension   . Inflammatory polyps of colon ([place])   . Lupus ([place])   . Lymphedema   . PCOS (polycystic ovarian syndrome)   . Prediabetes   . Recurrent UTI   . Sleep apnea   . SOB (shortness of breath)   . Swallowing difficulty   . Thyroid disease   . UTI (urinary tract infection)   . Vitamin D deficiency     Past Surgical History:  Procedure Laterality Date  . BREAST BIOPSY  2015  . CESAREAN SECTION  2004  . INNER EAR SURGERY     ear and sinus surgery  . LAPAROSCOPIC REPAIR AND REMOVAL OF GASTRIC BAND    . OOPHORECTOMY Left   . TONSILLECTOMY AND ADENOIDECTOMY      There were no vitals filed for this visit.     Subjective Assessment - 03/04/18 1657    Subjective   Mrs. Amiree No is referred to Occupational Therapy for evaluation and treatment f BLE lymphedema with initial onset in 2004. Pt is  well known to this therapist as she underwent both intensive abd management phase Complete Decongestive Therapy (CDT) from  05/02/2017 through 09/18/2017. Pt was discharged after fitting with custom, BLE compression garments and HOS devices. She met most, but not all LE self-care goals. Pt  expresses concern today re increaased swelling following rapid ~ 20 pound weight gain over the hot summer months. She is reports that she has been wearing existing compression knee highs, but isd concerned that leg swelling may "get out of contr9ol."    Pertinent History  morbid obesity, OA, OSA, BLE lymphedema, Hypothytoidism, HTN, Cardiac test results pending    Limitations  difficulty walking, impaired functional mobility and transfers, requires assistive devices to perform personal care and most basic ADLs, impaired instrumental ADLs, limited abiility to work and perform productive activities, decreased social participation and leisure pursuits, impaired body image    Repetition  Increases Symptoms    Currently in Pain?  Yes    Pain Score  --   not rated   Pain Location  Leg    Pain Orientation  Right;Left    Pain Descriptors / Indicators  Tightness;Heaviness;Discomfort;Tiring    Pain  Type  Chronic pain    Pain Onset  --   2004   Pain Frequency  Intermittent    Aggravating Factors   standing, walking, dependent positioning    Multiple Pain Sites  Yes       OT Education - 03/04/18 1528    Education Details  Provided Pt and family skilled education regarding lymphatic structure and function, lymphedema etiologies, onset patterns and stages of progression. Discussed  impact of obesity on lymphatic system function. Outlined Complete Decongestive Therapy (CDT)  as standard of LE care and provided in depth information regarding 4 primary components of both Intensive and Self Management Phases, including Manual Lymph Drainage (MLD), compression wrapping and garments, skin care, and therapeutic exercise.   Discussed    Importance of daily, ongoing LE self-care essential to retaining clinical gains and limiting progression.  Lastly, reviewed lymphedema precautions, including cellulitis risk and difficulty with wound healing. Provided printed Lymphedema Workbook for reference.    Person(s) Educated  Patient    Methods  Explanation;Demonstration    Comprehension  Verbalized understanding;Returned demonstration      [facility] OT Assessment - 03/04/18 0001      Assessment   Medical Diagnosis  Mild-moderate, stage 2, BLE lymphedema 2/2 morbid obesity, suspected CVI and dependent positioning    Referring Provider (OT)  2004    Hand Dominance  Right    Prior Therapy  IOT for ntensive and Management Phase CDT. Fitted with BLE, 2-piece , custom compression garments, and knee length HOD devices- no longer fit due to recent weight gain      Precautions   Precaution Comments  LE precautions      Restrictions   Other Position/Activity Restrictions  fall risk      Balance Screen   Has the patient fallen in the past 6 months  No    Has the patient had a decrease in activity level because of a fear of falling?   Yes    Is the patient reluctant to leave their home because of a fear of falling?   No      Prior Function   Level of Independence  Independent;Independent with basic ADLs;Independent with community mobility with device;Independent with household mobility with device;Independent with homemaking with ambulation;Independent with transfers    Vocation  Unemployed    Vocation Requirements  Single mother raising 52 yo boy. Undergoing divorce. Moved to [place] from [place] in late 2018    Leisure  family      Mobility   Mobility Status  Needs assist      Cognition   Overall Cognitive Status  Within Functional Limits for tasks assessed      Observation/Other Assessments   Outcome Measures  Lymphedema Life Impact Scale (LLIS); Comparative limb volumetrics      ROM / Strength   AROM / PROM / Strength  PROM;AROM       AROM   Overall AROM   Deficits;Other (comment)   due to limb girth     PROM   Overall PROM   Deficits   due to limb girth     Hand Function   Right Hand Gross Grasp  Functional    Left Hand Gross Grasp  Functional      BLE, L>R; Mild- Moderate Stage II, secondary to morbid obesity and suspected CVI Skin Description Hyper-Keratosis [place] Shiny Tight Fibrotic Fatty Doughy Indurated     X x moderate  mild     Hydration Dry Flaky  Erythema Other   x      Color Redness Present Pallor Blanching Hemosiderin Staining Other          Odor Malodorous Yeast  Absent      x   Temperature Warm Cool wnl   x     Pitting Edema   1+ 2+ 3+ 4+ Non-pitting         x       Plan - 03/04/18 1659    Clinical Impression Statement  DAROLYN DOUBLE is a 52 year-old female presenting with mild-moderate, stage II, BLE lymphedema (LE) secondary to morbid obesity, suspected venous insufficiency and dependent positioning. LE is a chronic, progressive, incurable condition characterized by swelling of the soft tissues in which an excessive amount of protein-rich lymph and tissue fibrosis have accumulated over time. Mrs. Gertz completed intensive phase CDT in in 08/2017 with a 4.78% limb volume reduction in the RLE and a 10.07% limb volume reduction in the LLE, both below the knee. She was fitted with custom compression garments for daytime and hours of sleep devices. Pt was not seen for follow-up during interval,  but returns today with concerns about recent, sudden weight gain and increased systemic fluid retention. She has a referral for cardiology consult pending and an echocardiogram scheduled. Because LE treatment is contraindicated for some patients with cardiac conditions, such as CHF, Complete Decongestive Therapy for BLE is not appropriate until cardiology issues are identified so correct precautions can be applied. Once Pt consult w/ cardiology is complete I am happy to proceed with CDT  as needed with supervision . In the meantime, Pt will benefit from replacing existing knee length compression stockings with like garments that fit. We'll complete those garment measurements early next week. Pt will return for fitting, then DC OT.    Occupational Profile and client history currently impacting functional performance  morbid obesity and arthritis pain also severly  limit mobility and ambulation    Occupational performance deficits (Please refer to evaluation for details):  IADL's;Leisure;Social Participation;Work;ADL's;Other;Education;Rest and Sleep   functional ambnulation and mobility, body image, role performance   Rehab Potential  Fair    Current Impairments/barriers affecting progress:  unknown cardiac status- test results and new cardiology referral pending    OT Frequency  Other (comment)   3-6 visits to perform anatomical measurements for custom gcompresssion garment replacements and return visits for fitting and adjustments PRN   OT Treatment/Interventions  Self-care/ADL training;DME and/or AE instruction;Other (comment);Patient/family education;Therapeutic activities    Clinical Decision Making  Multiple treatment options, significant modification of task necessary    Recommended Other Services  Replace existing custom , knee length,  flat knit, ccl 2 ( 25-35 mmHg) Jobst Elvarex compression stockings with larger stockings to accomodate weight gain.    Consulted and Agree with Plan of Care  Patient         OT Long Term Goals - 03/04/18 1649      OT LONG TERM GOAL #1   Title  LE SELF CARE: Pt will ahieve modified independence with donning and doffing custom, knee length compression stockings using assistive devices and extra time at  garment fitting after skilled LE self-care education.    Baseline  Max A    Time  1    Period  Days    Status  New    Target Date  --   3rd OT treatment visit\      .ot  Patient will  benefit from skilled therapeutic intervention in order to improve the following deficits and impairments:     Visit Diagnosis: Lymphedema, not elsewhere classified    Problem List Patient Active Problem List   Diagnosis Date Noted  . Weight gain 01/25/2018  . Hemorrhoids 01/05/2018  . Generalized postprandial abdominal pain 01/05/2018  . Hiatal hernia 01/05/2018  . Esophageal dysmotilities 01/05/2018  . Mixed hyperlipidemia 09/12/2017  . Multinodular goiter 07/27/2017  . Hyperparathyroidism ([place]) 06/13/2017  . Vitamin D deficiency 06/13/2017  . History of colonic polyps 05/15/2017  . Eustachian tube dysfunction, right 05/15/2017  . Lymphatic edema 05/15/2017  . Hypertension 05/14/2017  . Anemia 05/11/2017  . Arrhythmia 05/11/2017  . Diverticulosis 05/11/2017  . GERD (gastroesophageal reflux disease) 05/11/2017  . Hypothyroidism 05/11/2017  . Lower extremity edema 05/11/2017  . Morbid obesity ([place]) 05/11/2017  . Polycystic ovary syndrome 05/11/2017  . OSA on CPAP 05/11/2017  . Neck pain 05/11/2017  . Allergic rhinitis 05/11/2017  . 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 [place], [place], [address] Phone: [phone]   Fax:  [phone]  Name: RENATTA SHRIEVES MRN: [id number] Date of Birth: 05-10-1965

## 2018-03-01 ENCOUNTER — Ambulatory Visit (HOSPITAL_COMMUNITY): Payer: BLUE CROSS/BLUE SHIELD | Attending: Cardiovascular Disease

## 2018-03-01 ENCOUNTER — Other Ambulatory Visit: Payer: Self-pay

## 2018-03-01 DIAGNOSIS — I10 Essential (primary) hypertension: Secondary | ICD-10-CM | POA: Diagnosis present

## 2018-03-01 DIAGNOSIS — R600 Localized edema: Secondary | ICD-10-CM | POA: Diagnosis present

## 2018-03-01 DIAGNOSIS — Z9989 Dependence on other enabling machines and devices: Secondary | ICD-10-CM

## 2018-03-01 DIAGNOSIS — G4733 Obstructive sleep apnea (adult) (pediatric): Secondary | ICD-10-CM | POA: Diagnosis present

## 2018-03-01 DIAGNOSIS — I119 Hypertensive heart disease without heart failure: Secondary | ICD-10-CM

## 2018-03-01 MED ORDER — PERFLUTREN LIPID MICROSPHERE
1.0000 mL | INTRAVENOUS | Status: AC | PRN
  Administered 2018-03-01: 3 mL via INTRAVENOUS

## 2018-03-04 ENCOUNTER — Ambulatory Visit: Payer: BLUE CROSS/BLUE SHIELD | Attending: Nurse Practitioner | Admitting: Occupational Therapy

## 2018-03-04 DIAGNOSIS — I890 Lymphedema, not elsewhere classified: Secondary | ICD-10-CM | POA: Diagnosis present

## 2018-03-04 NOTE — Patient Instructions (Signed)

## 2018-03-04 NOTE — Addendum Note (Signed)
Addended by: Ansel Bong on: 03/04/2018 04:56 PM   Modules accepted: Orders

## 2018-03-05 NOTE — Therapy (Signed)
[place] MAIN [facility] SERVICES [address] [place], [place], [address] Phone: [phone]   Fax:  [phone]  Occupational Therapy Treatment  Patient Details  Name: Madison Palmer MRN: [id number] Date of Birth: December 04, 1965 Referring Provider (OT): 2004   Encounter Date: 03/04/2018  OT End of Session - 03/04/18 1443    Visit Number  2    Number of Visits  4    Date for OT Re-Evaluation  05/29/18    OT Start Time  1100    OT Stop Time  1155    OT Time Calculation (min)  55 min    Activity Tolerance  Patient tolerated treatment well;No increased pain;Patient limited by pain;Treatment limited secondary to medical complications (Comment)    Behavior During Therapy  [facility] for tasks assessed/performed       Past Medical History:  Diagnosis Date  . Anemia   . Anxiety   . Arrhythmia    heart  . Arthritis   . Cardiomyopathy ([place])   . Chickenpox   . Depression   . Diverticulitis   . Fluid retention   . GERD (gastroesophageal reflux disease)   . Glaucoma   . Headache   . History of PCOS   . Hyperlipidemia   . Hyperparathyroidism ([place])   . Hypertension   . Inflammatory polyps of colon ([place])   . Lupus ([place])   . Lymphedema   . PCOS (polycystic ovarian syndrome)   . Prediabetes   . Recurrent UTI   . Sleep apnea   . SOB (shortness of breath)   . Swallowing difficulty   . Thyroid disease   . UTI (urinary tract infection)   . Vitamin D deficiency     Past Surgical History:  Procedure Laterality Date  . BREAST BIOPSY  2015  . CESAREAN SECTION  2004  . INNER EAR SURGERY     ear and sinus surgery  . LAPAROSCOPIC REPAIR AND REMOVAL OF GASTRIC BAND    . OOPHORECTOMY Left   . TONSILLECTOMY AND ADENOIDECTOMY      There were no vitals filed for this visit.  Subjective Assessment - 03/05/18 1438    Subjective   Mrs. Clarece Drzewiecki is referred to Occupational Therapy for evaluation and treatment f BLE lymphedema with initial onset in  2004. Pt is well known to this therapist as she underwent both intensive abd management phase Complete Decongestive Therapy (CDT) from  05/02/2017 through 09/18/2017. Pt was discharged after fitting with custom, BLE compression garments and HOS devices. She met most, but not all LE self-care goals. Pt  expresses concern today re increaased swelling following rapid ~ 20 pound weight gain over the hot summer months. She is reports that she has been wearing existing compression knee highs, but isd concerned that leg swelling may "get out of contr9ol."    Pertinent History  morbid obesity, OA, OSA, BLE lymphedema, Hypothytoidism, HTN, Cardiac test results pending    Limitations  difficulty walking, impaired functional mobility and transfers, requires assistive devices to perform personal care and most basic ADLs, impaired instrumental ADLs, limited abiility to work and perform productive activities, decreased social participation and leisure pursuits, impaired body image    Repetition  Increases Symptoms    Pain Onset  --   2004                  OT Treatments/Exercises (OP) - 03/05/18 0001      ADLs   ADL Education Given  Yes  Manual Therapy   Manual Therapy  Edema management    Edema Management  Completed anatomical measurements for custom compression garments replacement .             OT Education - 03/04/18 1441    Education Details  Continued skilled Pt/caregiver education  And LE ADL training throughout visit for lymphedema self care/ home program, including compression wrapping, compression garment and device wear/care, lymphatic pumping ther ex, simple self-MLD, and skin care. Discussed progress towards goals.     Person(s) Educated  Patient    Methods  Explanation;Demonstration    Comprehension  Verbalized understanding;Returned demonstration          OT Long Term Goals - 03/04/18 1649      OT LONG TERM GOAL #1   Title  LE SELF CARE: Pt will ahieve modified  independence with donning and doffing custom, knee length compression stockings using assistive devices and extra time at  garment fitting after skilled LE self-care education.    Baseline  Max A    Time  1    Period  Days    Status  New    Target Date  --   3rd OT treatment visit_0           Plan - 03/04/18 1444    Clinical Impression Statement  Completed anatomical measurements     for custom compression knee highs. Existing garments are too small due to recent sudden weight gain. Pt will call to schedule  return visit for garment fitting and assessment within 10 days of receiving thm from vendor.     Occupational Profile and client history currently impacting functional performance  morbid obesity and arthritis pain also severly  limit mobility and ambulation    Occupational performance deficits (Please refer to evaluation for details):  IADL's;Leisure;Social Participation;Work;ADL's;Other;Education;Rest and Sleep   functional ambnulation and mobility, body image, role performance   Rehab Potential  Fair    Current Impairments/barriers affecting progress:  unknown cardiac status- test results and new cardiology referral pending    OT Frequency  Other (comment)   3-6 visits to perform anatomical measurements for custom gcompresssion garment replacements and return visits for fitting and adjustments PRN   OT Treatment/Interventions  Self-care/ADL training;DME and/or AE instruction;Other (comment);Patient/family education;Therapeutic activities    Clinical Decision Making  Multiple treatment options, significant modification of task necessary    Recommended Other Services  Replace existing custom , knee length,  flat knit, ccl 2 ( 25-35 mmHg) Jobst Elvarex compression stockings with larger stockings to accomodate weight gain.    Consulted and Agree with Plan of Care  Patient       Patient will benefit from skilled therapeutic intervention in order to improve the following deficits and  impairments:  Abnormal gait, Decreased balance, Decreased mobility, Decreased skin integrity, Difficulty walking, Obesity, Decreased range of motion, Increased edema, Pain, Other (comment), Decreased knowledge of use of DME, Impaired flexibility, Decreased knowledge of precautions  Visit Diagnosis: Lymphedema, not elsewhere classified    Problem List Patient Active Problem List   Diagnosis Date Noted  . Weight gain 01/25/2018  . Hemorrhoids 01/05/2018  . Generalized postprandial abdominal pain 01/05/2018  . Hiatal hernia 01/05/2018  . Esophageal dysmotilities 01/05/2018  . Mixed hyperlipidemia 09/12/2017  . Multinodular goiter 07/27/2017  . Hyperparathyroidism ([place]) 06/13/2017  . Vitamin D deficiency 06/13/2017  . History of colonic polyps 05/15/2017  . Eustachian tube dysfunction, right 05/15/2017  . Lymphatic edema 05/15/2017  . Hypertension 05/14/2017  .  Anemia 05/11/2017  . Arrhythmia 05/11/2017  . Diverticulosis 05/11/2017  . GERD (gastroesophageal reflux disease) 05/11/2017  . Hypothyroidism 05/11/2017  . Lower extremity edema 05/11/2017  . Morbid obesity ([place]) 05/11/2017  . Polycystic ovary syndrome 05/11/2017  . OSA on CPAP 05/11/2017  . Neck pain 05/11/2017  . Allergic rhinitis 05/11/2017  . 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

## 2018-03-07 NOTE — Progress Notes (Signed)
Office Visit Note  Patient: Madison Palmer             Date of Birth: April 23, 1966           MRN: [id number]             PCP: Flossie Buffy, NP Referring: Flossie Buffy, NP Visit Date: 03/21/2018 Occupation: @GUAROCC @  Subjective:  Pain in joints and fatigue.   History of Present Illness: Madison Palmer is a 52 y.o. female seen in consultation per request of her PCP.  According to patient she moved to [place] in 2016.  At the time she could not walk without assistance and was quite active.  She states that over time she had difficulty with her mobility in the last 2 years.  She was seen by Dr. 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  She has been getting Visco supplement injections to her knee joints every 6 months which helped her.  She has been also using a cane now for assistance.  She states in September 2019 she started experiencing his increased fatigue and was not very active.  She gained about 20 pounds in 1 month.  Her HCTZ was also reduced due to hypercalcemia.  She was switched to Aldactone.  She states that Aldactone was later discontinued due to increased muscle pain.  She increase her HCTZ back to 50 mg a day.  Which helped her fatigue to some extent.  She states she was seen by her PCP who did lab work and her ANA was positive for that reason she was referred to me.  In the meantime she has been also experiencing lower back pain.  She had a x-ray which was consistent with degenerative disc disease.  She was sent to physical therapy and also orthopedic surgeon.  She has been experiencing muscle tightness in her neck and shoulder area as well.  She denies any history of joint swelling.  Activities of Daily Living:  Patient reports morning stiffness for several hours.   Patient Reports nocturnal pain.  Difficulty dressing/grooming:  Reports Difficulty climbing stairs: Reports Difficulty getting out of chair: Denies Difficulty using hands for taps, buttons, cutlery, and/or writing: Denies  Review of Systems  Constitutional: Positive for fatigue. Negative for night sweats, weight gain and weight loss.  HENT: Positive for mouth dryness. Negative for mouth sores, trouble swallowing, trouble swallowing and nose dryness.   Eyes: Positive for dryness. Negative for pain, redness, itching and visual disturbance.  Respiratory: Negative for cough, shortness of breath, wheezing and difficulty breathing.   Cardiovascular: Negative for chest pain, palpitations, hypertension, irregular heartbeat and swelling in legs/feet.  Gastrointestinal: Positive for abdominal pain. Negative for blood in stool, constipation, diarrhea, nausea and vomiting.       Followed by GI   Endocrine: Negative for increased urination.  Genitourinary: Negative for painful urination, nocturia, pelvic pain and vaginal dryness.  Musculoskeletal: Positive for arthralgias, gait problem, joint pain, muscle weakness, morning stiffness and muscle tenderness. Negative for joint swelling, myalgias and myalgias.  Skin: Negative for color change, rash, hair loss, skin tightness, ulcers and sensitivity to sunlight.  Allergic/Immunologic: Negative for susceptible to infections.  Neurological: Positive for weakness. Negative for dizziness, light-headedness, headaches, memory loss and night sweats.  Hematological: Negative for bruising/bleeding tendency and swollen glands.  Psychiatric/Behavioral: Negative for depressed mood, confusion and sleep disturbance. The  patient is not nervous/anxious.     PMFS History:  Patient Active Problem List   Diagnosis Date Noted  . DDD (degenerative disc disease), lumbar 03/21/2018  . Primary osteoarthritis of both knees 03/21/2018  . Dysphagia 03/19/2018  . PVC (premature ventricular contraction) 03/12/2018  . LVH (left ventricular  hypertrophy) 03/12/2018  . Generalized edema 03/12/2018  . Bilateral carotid artery stenosis 03/12/2018  . Weight gain 01/25/2018  . Hemorrhoids 01/05/2018  . Generalized postprandial abdominal pain 01/05/2018  . Hiatal hernia 01/05/2018  . Esophageal dysmotilities 01/05/2018  . Mixed hyperlipidemia 09/12/2017  . Multinodular goiter 07/27/2017  . Hyperparathyroidism ([place]) 06/13/2017  . Vitamin D deficiency 06/13/2017  . Hx of adenomatous colonic polyps 05/15/2017  . Eustachian tube dysfunction, right 05/15/2017  . Lymphatic edema 05/15/2017  . Hypertension 05/14/2017  . Anemia 05/11/2017  . Arrhythmia 05/11/2017  . Diverticulosis 05/11/2017  . GERD (gastroesophageal reflux disease) 05/11/2017  . Hypothyroidism 05/11/2017  . Lower extremity edema 05/11/2017  . Morbid obesity ([place]) 05/11/2017  . Polycystic ovary syndrome 05/11/2017  . OSA on CPAP 05/11/2017  . Neck pain 05/11/2017  . Allergic rhinitis 05/11/2017  . 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 Anemia   . Anxiety   . Arrhythmia    tachycardia  . Arthritis   . Chickenpox   . Depression   . Diverticulitis   . GERD (gastroesophageal reflux disease)   . Glaucoma   . Hyperlipidemia   . Hyperparathyroidism ([place])   . Hypertension   . Inflammatory polyps of colon ([name])   . LVH (left ventricular hypertrophy)   . Lymphedema   . PCOS (polycystic ovarian syndrome)   . Prediabetes   . Recurrent UTI   . Sleep apnea    CPAP  . Thyroid disease   . Vitamin D deficiency     Family History  Adopted: Yes  Problem Relation Age of Onset  . Hearing loss Son        right   . Healthy Son    Past Surgical History:  Procedure Laterality Date  . BREAST BIOPSY  2015  . CESAREAN SECTION  2004  . INNER EAR SURGERY     ear and sinus surgery  . LAPAROSCOPIC REPAIR AND REMOVAL OF GASTRIC BAND    . OOPHORECTOMY Left   . TONSILLECTOMY AND ADENOIDECTOMY      Social History   Social History Narrative   Lives with son and companion.      Objective: Vital Signs: BP (!) 179/100 (BP Location: Right Arm, Patient Position: Sitting, Cuff Size: Large)   Pulse 72   Resp 17   Ht 5\' 2"  (1.575 m)   Wt (!) 342 lb (155.1 kg)   BMI 62.55 kg/m    Physical Exam  Constitutional: She is oriented to person, place, and time. She appears well-developed and well-nourished.  Morbidly obese, walks with a cane  HENT:  Head: Normocephalic and atraumatic.  Eyes: Conjunctivae and EOM are normal.  Neck: Normal range of motion.  Cardiovascular: Normal rate, regular rhythm, normal heart sounds and intact distal pulses.  Pulmonary/Chest: Effort normal and breath sounds normal.  Abdominal: Soft. Bowel sounds are normal.  Musculoskeletal: She exhibits edema.  Bilateral lower extremeties  Lymphadenopathy:    She has no cervical adenopathy.  Neurological: She is alert and oriented to person, place, and time.  Skin: Skin is warm and dry. Capillary refill takes less than 2 seconds.  Psychiatric:  She has a normal mood and affect. Her behavior is normal.  Nursing note and vitals reviewed.    Musculoskeletal Exam: C-spine good range of motion.  Thoracic and lumbar spine range of motion was difficult to assess.  Shoulder joints elbow joints wrist joint MCPs PIPs DIPs were in good range of motion with no synovitis.  Hip joints range of motion was difficult to assess.  She has good range of motion of bilateral knee joints without any warmth swelling or effusion.  CDAI Exam: CDAI Score: Not documented Patient Global Assessment: Not documented; Provider Global Assessment: Not documented Swollen: Not documented; Tender: Not documented Joint Exam   Not documented   There is currently no information documented on the homunculus. Go to the Rheumatology activity and complete the homunculus joint exam.  Investigation: Findings:  02/20/18: Iron 44, transferrin 356,  saturation ratio 8.8, sed rate 19, CRP 0.2, Vitamin B12 263, folate 10.4, ANA 1:40 Nuclear, Homogeneous  Component     Latest Ref Rng & Units 02/20/2018  Iron     42 - 145 ug/dL 44  Transferrin     212.0 - 360.0 mg/dL 356.0  Saturation Ratios     20.0 - 50.0 % 8.8 (L)  Sed Rate     0 - 30 mm/hr 19  CRP     0.5 - 20.0 mg/dL 0.2 (L)  Anti Nuclear Antibody(ANA)     NEGATIVE POSITIVE (A)   Component     Latest Ref Rng & Units 02/20/2018  Vitamin B12     211 - 911 pg/mL 263  Folate     >5.9 ng/mL 10.4  ANA Titer 1     titer 1:40 (H)  ANA Pattern 1      Nuclear, Homogeneous (A)   Imaging: US Abdomen Complete  Result Date: 03/20/2018 CLINICAL DATA:  Postprandial abdominal pain EXAM: ABDOMEN ULTRASOUND COMPLETE COMPARISON:  None. FINDINGS: Gallbladder: The gallbladder is adequately distended. There are multiple gallstones with the largest measuring 2.1 cm in diameter. There is no gallbladder wall thickening, pericholecystic fluid, or positive sonographic Murphy's sign Common bile duct: Diameter: 4.2 mm Liver: No focal lesion identified. Within normal limits in parenchymal echogenicity. Portal vein is patent on color Doppler imaging with normal direction of blood flow towards the liver. IVC: No abnormality visualized. Pancreas: The pancreatic body is normal in appearance. The pancreatic head and tail are obscured by bowel gas. Spleen: Size and appearance within normal limits. Right Kidney: Length: 11.4 cm. Echogenicity within normal limits. No mass or hydronephrosis visualized. Left Kidney: Length: 11.0 cm. Echogenicity within normal limits. No mass or hydronephrosis visualized. Abdominal aorta: No aneurysm visualized. Other findings: There is no ascites. IMPRESSION: Multiple gallstones. No sonographic evidence of acute cholecystitis. No acute intra-abdominal abnormality is observed otherwise. Limited visualization of the pancreas. Electronically Signed   By: David  Martinique M.D.   On:  03/20/2018 15:46    Recent Labs: Lab Results  Component Value Date   WBC 7.0 02/20/2018   HGB 15.1 (H) 02/20/2018   PLT 280.0 02/20/2018   NA 142 02/27/2018   K 3.9 02/27/2018   CL 94 (L) 02/27/2018   CO2 29 02/27/2018   GLUCOSE 100 (H) 02/27/2018   BUN 15 02/27/2018   CREATININE 1.02 (H) 02/27/2018   BILITOT 0.9 02/27/2018   ALKPHOS 100 02/27/2018   AST 22 02/27/2018   ALT 27 02/27/2018   PROT 6.9 02/27/2018   ALBUMIN 4.3 02/27/2018   CALCIUM 10.6 (H) 02/27/2018   GFRAA 74  02/27/2018    Speciality Comments: No specialty comments available.  Procedures:  No procedures performed Allergies: Fentanyl; Levofloxacin; Midazolam; Pollen extract; Atorvastatin; and Rosuvastatin   Assessment / Plan:     Visit Diagnoses: Positive ANA (antinuclear antibody) - 02/20/18: Iron 44, transferrin 356, saturation ratio 8.8, sed rate 19, CRP 0.2, Vitamin B12 263, folate 10.4, ANA 1:40 Nuclear, Homogeneous -patient is low titer positive ANA.  She does not have any clinical features of lupus.  Although she is very much concerned about possible lupus.  I will obtain following labs today.  Plan: Anti-scleroderma antibody, RNP Antibody, Anti-Smith antibody, Sjogrens syndrome-A extractable nuclear antibody, Sjogrens syndrome-B extractable nuclear antibody, Anti-DNA antibody, double-stranded, C3 and C4, Beta-2 glycoprotein antibodies, Cardiolipin antibodies, IgG, IgM, IgA, Lupus Anticoagulant Eval w/Reflex  DDD (degenerative disc disease), lumbar -she continues to have a lot of discomfort in her lower back.  Weight loss diet and exercise was discussed.    Primary osteoarthritis of both knees-she gives history of intermittent swelling in her knee joints.  No warmth swelling effusion was noted.  Patient states that she has end-stage osteoarthritis of her knee joints.  She is not able to have knee joint replacement due to her weight.  She has been getting Visco supplement injections.  Plan: Rheumatoid factor,  Cyclic citrul peptide antibody, IgG  Other fatigue -she complains of increased fatigue.  I will obtain following labs.  Plan: CK, Serum protein electrophoresis with reflex  OSA on CPAP-this could be contributing to her fatigue.  Morbid obesity-hinders her mobility as well.  Vitamin D deficiency-treated according to patient.  Other medical problems listed as follows:  Essential hypertension  Mixed hyperlipidemia  Esophageal dysmotilities  History of colonic polyps  Diverticulosis  History of gastroesophageal reflux (GERD)  Multinodular goiter  History of hypothyroidism  Hyperparathyroidism ([place])  Anxiety and depression  Polycystic ovary syndrome  Lymphedema   Orders: Orders Placed This Encounter  Procedures  . CK  . Rheumatoid factor  . Cyclic citrul peptide antibody, IgG  . Anti-scleroderma antibody  . RNP Antibody  . Anti-Smith antibody  . Sjogrens syndrome-A extractable nuclear antibody  . Sjogrens syndrome-B extractable nuclear antibody  . Anti-DNA antibody, double-stranded  . C3 and C4  . Beta-2 glycoprotein antibodies  . Cardiolipin antibodies, IgG, IgM, IgA  . Lupus Anticoagulant Eval w/Reflex  . Serum protein electrophoresis with reflex   No orders of the defined types were placed in this encounter.   Face-to-face time spent with patient was 50 minutes. Greater than 50% of time was spent in counseling and coordination of care.  Follow-Up Instructions: Return for +ANA.   Bo Merino, MD  Note - This record has been created using Editor, commissioning.  Chart creation errors have been sought, but may not always  have been located. Such creation errors do not reflect on  the standard of medical care.

## 2018-03-11 ENCOUNTER — Encounter: Payer: Self-pay | Admitting: Cardiology

## 2018-03-11 NOTE — Progress Notes (Signed)
Cardiology Office Note   Date:  03/12/2018   ID:  Madison Palmer, DOB [birth year], MRN [id number]  PCP:  Flossie Buffy, NP  Cardiologist:   No primary care provider on file.   Chief Complaint  Patient presents with  . Hypertension  . Edema      History of Present Illness: Madison Palmer is a 52 y.o. female who is referred by [name], Charlene Brooke, NP for evaluation of difficult to control HTN and LVH.   This was done when she was noted to have ventricular bigeminy.  She did have an echo earlier this month with a normal EF of 60 - 65% with severe concentric LVH.   She also is noted to have a past history of difficult to control hypertension.  She moved here from [place].  She saw her cardiologist there and I was able to review she is had LVH with preserved ejection fraction.  She had some mild internal carotid artery plaque noted on Doppler in the past.  She had a perfusion study in 2018 that demonstrated no evidence of ischemia or infarct.  She is been managed for her hypertension.  She says is well controlled at home and shows any blood pressure diary although it is elevated here today.  She was given Norvasc recently but did not start this because of lower extremity swelling.  She was also referred in part to evaluate.  She states she gained about 20 pounds in 1 month.  She is been found to have an elevated ANA and being worked up for lupus.  She said that she thought her weight started when she had her hydrochlorothiazide reduced because they thought it was exacerbating her hypercalcemia related to hyperparathyroidism.  The reduction in the hydrochlorothiazide happened at the same time as the weight gain.  He was also at the time that she might have a new diagnosis.  She restarted to 50 mg hydrochlorothiazide on her own and she thinks her swelling is gotten better.  She has some chronic dyspnea but this is not new.  She is not noticing any PVCs and has no palpitations, presyncope or  syncope.  She has no chest pressure, neck or arm discomfort.  She is starting with the Healthy Weight program.  She had gastric surgery reversed because of reflux in the past.  She also has problems with lymphedema.    Past Medical History:  Diagnosis Date  . Anemia   . Anxiety   . Arrhythmia    tachycardia  . Arthritis   . Chickenpox   . Depression   . Diverticulitis   . GERD (gastroesophageal reflux disease)   . Glaucoma   . Hyperlipidemia   . Hyperparathyroidism ([place])   . Hypertension   . Inflammatory polyps of colon ([place])   . LVH (left ventricular hypertrophy)   . Lymphedema   . PCOS (polycystic ovarian syndrome)   . Prediabetes   . Recurrent UTI   . Sleep apnea    CPAP  . Thyroid disease   . Vitamin D deficiency     Past Surgical History:  Procedure Laterality Date  . BREAST BIOPSY  2015  . CESAREAN SECTION  2004  . INNER EAR SURGERY     ear and sinus surgery  . LAPAROSCOPIC REPAIR AND REMOVAL OF GASTRIC BAND    . OOPHORECTOMY Left   . TONSILLECTOMY AND ADENOIDECTOMY       Current Outpatient Medications  Medication Sig Dispense Refill  .  ergocalciferol (VITAMIN D2) 50000 units capsule Take 1 capsule (50,000 Units total) by mouth 3 (three) times a week. 12 capsule 0  . hydrochlorothiazide (HYDRODIURIL) 50 MG tablet Take 50 mg by mouth daily.    . methylPREDNISolone (MEDROL) 4 MG tablet Take 4 mg by mouth daily.    . Acetaminophen (TYLENOL EXTRA STRENGTH PO) Take by mouth. Takes 2 tablets 2-3 times a day.    Marland Kitchen amLODipine (NORVASC) 2.5 MG tablet Take 1 tablet (2.5 mg total) by mouth daily. 90 tablet 23  . atenolol (TENORMIN) 25 MG tablet TK 1 T PO D  0  . esomeprazole (NEXIUM) 40 MG capsule Take 1 capsule (40 mg total) by mouth 2 (two) times daily. 180 capsule 0  . fluticasone (FLONASE) 50 MCG/ACT nasal spray Place 2 sprays into both nostrils daily.    . furosemide (LASIX) 40 MG tablet Take 40 mg by mouth. Take as needed for 3 days    . hydrALAZINE (APRESOLINE)  50 MG tablet Take 1tab in morning and afternoon, and 2tabs at bedtime 120 tablet 6  . ibuprofen (ADVIL,MOTRIN) 600 MG tablet Take 600 mg by mouth every 6 (six) hours as needed.    Marland Kitchen levothyroxine (SYNTHROID, LEVOTHROID) 75 MCG tablet TK 1 T PO QD  MONDAY-FRIDAY  0  . omega-3 fish oil (MAXEPA) 1000 MG CAPS capsule Take 2 capsules (2,000 mg total) by mouth 2 (two) times daily. (Patient not taking: Reported on 01/25/2018) 90 each   . potassium chloride (KLOR-CON) 20 MEQ packet Take by mouth. Take as needed for 3 days     No current facility-administered medications for this visit.     Allergies:   Fentanyl; Levofloxacin; Midazolam; Pollen extract; Atorvastatin; and Rosuvastatin    Social History:  The patient  reports that she has never smoked. She has never used smokeless tobacco. She reports that she drinks alcohol. She reports that she does not use drugs.   Family History:  The patient's family history is not on file. She was adopted.    ROS:  Please see the history of present illness.   Otherwise, review of systems are positive for none.   All other systems are reviewed and negative.    PHYSICAL EXAM: VS:  BP (!) 162/100   Pulse 98   Ht 5\' 2"  (1.575 m)   Wt (!) 354 lb (160.6 kg)   SpO2 91%   BMI 64.75 kg/m  , BMI Body mass index is 64.75 kg/m. GENERAL:  Well appearing HEENT:  Pupils equal round and reactive, fundi not visualized, oral mucosa unremarkable NECK:  No jugular venous distention, waveform within normal limits, carotid upstroke brisk and symmetric, no bruits, no thyromegaly LYMPHATICS:  No cervical, inguinal adenopathy LUNGS:  Clear to auscultation bilaterally BACK:  No CVA tenderness CHEST:  Unremarkable HEART:  PMI not displaced or sustained,S1 and S2 within normal limits, no S3, positive S4, no clicks, no rubs, no murmurs ABD:  Flat, positive bowel sounds normal in frequency in pitch, no bruits, no rebound, no guarding, no midline pulsatile mass, no hepatomegaly, no  splenomegaly EXT:  2 plus pulses throughout, no edema, no cyanosis no clubbing SKIN:  No rashes no nodules NEURO:  Cranial nerves II through XII grossly intact, motor grossly intact throughout PSYCH:  Cognitively intact, oriented to person place and time    EKG:  EKG is ordered today. The ekg ordered 02/27/18 demonstrates sinus rhythm, rate 90, left axis, poor anterior R wave progression, premature ventricular contractions in a bigeminal  pattern,   Recent Labs: 02/20/2018: Hemoglobin 15.1; Magnesium 2.2; Platelets 280.0; Pro B Natriuretic peptide (BNP) 173.0 02/27/2018: ALT 27; BUN 15; Creatinine, Ser 1.02; Potassium 3.9; Sodium 142; TSH 1.450    Lipid Panel    Component Value Date/Time   CHOL 196 02/27/2018 1233   TRIG 142 02/27/2018 1233   HDL 46 02/27/2018 1233   CHOLHDL 5 09/10/2017 1156   VLDL 26.6 09/10/2017 1156   LDLCALC 122 (H) 02/27/2018 1233      Wt Readings from Last 3 Encounters:  03/12/18 (!) 354 lb (160.6 kg)  02/27/18 (!) 357 lb (161.9 kg)  02/20/18 (!) 362 lb (164.2 kg)      Other studies Reviewed: Additional studies/ records that were reviewed today include: Echo, outside records including carotid, echo and Lexiscan Myoview.. Review of the above records demonstrates:  Please see elsewhere in the note.     ASSESSMENT AND PLAN:  HTN: I think she should start Norvasc and I told her I would not be too concerned about swelling if we started a low-dose of 2.5 mg.  Certainly if her BP should also come down with weight loss and I encouraged this.  LVH: I will follow this and consider an MRI after reviewing further records.  I suspect this is related to hypertension although we do not know any family history and I will consider infiltrative etiologies.  LYMPHEDEMA:  I see no contraindication to OT for this if needed.    EDEMA: I agree with continuing the prior to his hydrochlorothiazide.  We talked about salt and fluid restriction.  OBESITY: I encouraged  the weight loss program that she is anticipating in.  CAROTID STENOSIS: She has some mild stenosis and we will follow-up with a carotid Doppler in a couple years.  PVCs: She is not symptomatic with these.  I do see that he has had normal electrolytes and TSH recently.  I will apply a 24-hour Holter.  Current medicines are reviewed at length with the patient today.  The patient does not have concerns regarding medicines.  The following changes have been made:  no change  Labs/ tests ordered today include:   Orders Placed This Encounter  Procedures  . HOLTER MONITOR - 24 HOUR     Disposition:   FU with me in four months.     Signed, Minus Breeding, MD  03/12/2018 4:09 PM    [facility]

## 2018-03-12 ENCOUNTER — Ambulatory Visit: Payer: BLUE CROSS/BLUE SHIELD | Admitting: Cardiology

## 2018-03-12 ENCOUNTER — Encounter: Payer: Self-pay | Admitting: Cardiology

## 2018-03-12 VITALS — BP 162/100 | HR 98 | Ht 62.0 in | Wt 354.0 lb

## 2018-03-12 DIAGNOSIS — R601 Generalized edema: Secondary | ICD-10-CM | POA: Insufficient documentation

## 2018-03-12 DIAGNOSIS — I10 Essential (primary) hypertension: Secondary | ICD-10-CM

## 2018-03-12 DIAGNOSIS — I517 Cardiomegaly: Secondary | ICD-10-CM | POA: Insufficient documentation

## 2018-03-12 DIAGNOSIS — I6523 Occlusion and stenosis of bilateral carotid arteries: Secondary | ICD-10-CM | POA: Insufficient documentation

## 2018-03-12 DIAGNOSIS — I493 Ventricular premature depolarization: Secondary | ICD-10-CM | POA: Insufficient documentation

## 2018-03-12 HISTORY — DX: Generalized edema: R60.1

## 2018-03-12 MED ORDER — AMLODIPINE BESYLATE 2.5 MG PO TABS: 2.5000 mg | ORAL_TABLET | Freq: Every day | ORAL | 23 refills | Status: DC

## 2018-03-12 NOTE — Patient Instructions (Signed)
Medication Instructions:  START- Amlodipine 2.5 mg daily  If you need a refill on your cardiac medications before your next appointment, please call your pharmacy.  Labwork: None Ordered   If you have labs (blood work) drawn today and your tests are completely normal, you will receive your results only by: Marland Kitchen MyChart Message (if you have MyChart) OR . A paper copy in the mail If you have any lab test that is abnormal or we need to change your treatment, we will call you to review the results.  Testing/Procedures: Your physician has recommended that you wear a holter monitor 24 Hour. Holter monitors are medical devices that record the heart's electrical activity. Doctors most often use these monitors to diagnose arrhythmias. Arrhythmias are problems with the speed or rhythm of the heartbeat. The monitor is a small, portable device. You can wear one while you do your normal daily activities. This is usually used to diagnose what is causing palpitations/syncope (passing out).   Follow-Up: . You will need a follow up appointment in 4 Months.   At [facility], you and your health needs are our priority.  As part of our continuing mission to provide you with exceptional heart care, we have created designated Provider Care Teams.  These Care Teams include your primary Cardiologist (physician) and Advanced Practice Providers (APPs -  Physician Assistants and Nurse Practitioners) who all work together to provide you with the care you need, when you need it.   Thank you for choosing [facility]!!

## 2018-03-13 ENCOUNTER — Ambulatory Visit: Payer: BLUE CROSS/BLUE SHIELD | Admitting: Nurse Practitioner

## 2018-03-13 ENCOUNTER — Encounter: Payer: Self-pay | Admitting: Nurse Practitioner

## 2018-03-13 VITALS — BP 164/86 | HR 85 | Temp 97.9°F | Ht 62.0 in | Wt 353.0 lb

## 2018-03-13 DIAGNOSIS — J309 Allergic rhinitis, unspecified: Secondary | ICD-10-CM | POA: Diagnosis not present

## 2018-03-13 DIAGNOSIS — N644 Mastodynia: Secondary | ICD-10-CM | POA: Diagnosis not present

## 2018-03-13 MED ORDER — CETIRIZINE HCL 10 MG PO TABS: 10.0000 mg | ORAL_TABLET | Freq: Every day | ORAL | 5 refills | Status: DC

## 2018-03-13 NOTE — Patient Instructions (Signed)
You will be contacted to schedule breast US

## 2018-03-13 NOTE — Progress Notes (Signed)
Subjective:  Patient ID: Madison Palmer, female    DOB: Apr 20, 1966  Age: 52 y.o. MRN: [id number]  CC: Follow-up (pt is complaining of left niple painful to touch,very sensitive.going on 1 mo. denied discharge. zyrtec refills? FYi saw heart doc yesterday, BP reading at home 11/9: 126/79, 11/10: 141/93 and 11/12: 146/85 (will srart amlodipine this weekend). )   HPI Hyperparathyroidism: She scheduled appt with endocrinology with [facility] on Monday for second opinion as directed by weight loss clinic.  Left Nipple pain: She also present with left nipple tenderness, onset 43month ago, worsening, denies any nipple manipulation or chest wall injury. Denies any nipple discharge or rash.  drinks coffee intake 30oz per day x 29yrs. Mammogram last done 11/27/2017: normal Unknown family history, she was adopted.  Reviewed past Medical, Social and Family history today.  Outpatient Medications Prior to Visit  Medication Sig Dispense Refill  . Acetaminophen (TYLENOL EXTRA STRENGTH PO) Take by mouth. Takes 2 tablets 2-3 times a day.    Marland Kitchen amLODipine (NORVASC) 2.5 MG tablet Take 1 tablet (2.5 mg total) by mouth daily. 90 tablet 23  . atenolol (TENORMIN) 25 MG tablet TK 1 T PO D  0  . ergocalciferol (VITAMIN D2) 50000 units capsule Take 1 capsule (50,000 Units total) by mouth 3 (three) times a week. 12 capsule 0  . esomeprazole (NEXIUM) 40 MG capsule Take 1 capsule (40 mg total) by mouth 2 (two) times daily. 180 capsule 0  . fluticasone (FLONASE) 50 MCG/ACT nasal spray Place 2 sprays into both nostrils daily.    . hydrALAZINE (APRESOLINE) 50 MG tablet Take 1tab in morning and afternoon, and 2tabs at bedtime 120 tablet 6  . hydrochlorothiazide (HYDRODIURIL) 50 MG tablet Take 50 mg by mouth daily.    Marland Kitchen ibuprofen (ADVIL,MOTRIN) 600 MG tablet Take 600 mg by mouth every 6 (six) hours as needed.    Marland Kitchen levothyroxine (SYNTHROID, LEVOTHROID) 75 MCG tablet TK 1 T PO QD  MONDAY-FRIDAY  0  . methylPREDNISolone  (MEDROL) 4 MG tablet Take 4 mg by mouth daily.    Marland Kitchen omega-3 fish oil (MAXEPA) 1000 MG CAPS capsule Take 2 capsules (2,000 mg total) by mouth 2 (two) times daily. 90 each   . potassium chloride (KLOR-CON) 20 MEQ packet Take by mouth. Take as needed for 3 days    . furosemide (LASIX) 40 MG tablet Take 40 mg by mouth. Take as needed for 3 days     No facility-administered medications prior to visit.     ROS See HPI  Objective:  BP (!) 164/86   Pulse 85   Temp 97.9 F (36.6 C) (Oral)   Ht 5\' 2"  (1.575 m)   Wt (!) 353 lb (160.1 kg)   SpO2 93%   BMI 64.56 kg/m   BP Readings from Last 3 Encounters:  03/13/18 (!) 164/86  03/12/18 (!) 162/100  02/27/18 (!) 172/98    Wt Readings from Last 3 Encounters:  03/13/18 (!) 353 lb (160.1 kg)  03/12/18 (!) 354 lb (160.6 kg)  02/27/18 (!) 357 lb (161.9 kg)    Physical Exam  Constitutional: She is oriented to person, place, and time. She appears well-developed and well-nourished.  Cardiovascular: Normal rate.  Pulmonary/Chest: Effort normal. She exhibits no mass and no bony tenderness. Right breast exhibits no inverted nipple, no mass, no nipple discharge, no skin change and no tenderness. Left breast exhibits tenderness. Left breast exhibits no inverted nipple, no mass, no nipple discharge and no skin change.  Breasts are symmetrical.  Musculoskeletal: She exhibits no tenderness.  Neurological: She is alert and oriented to person, place, and time.  Skin: No rash noted. No erythema.  Psychiatric: She has a normal mood and affect. Her behavior is normal.  Vitals reviewed.   Lab Results  Component Value Date   WBC 7.0 02/20/2018   HGB 15.1 (H) 02/20/2018   HCT 46.5 (H) 02/20/2018   PLT 280.0 02/20/2018   GLUCOSE 100 (H) 02/27/2018   CHOL 196 02/27/2018   TRIG 142 02/27/2018   HDL 46 02/27/2018   LDLCALC 122 (H) 02/27/2018   ALT 27 02/27/2018   AST 22 02/27/2018   NA 142 02/27/2018   K 3.9 02/27/2018   CL 94 (L) 02/27/2018    CREATININE 1.02 (H) 02/27/2018   BUN 15 02/27/2018   CO2 29 02/27/2018   TSH 1.450 02/27/2018   HGBA1C 6.0 (H) 02/27/2018    Dg Lumbar Spine Complete  Result Date: 12/24/2017 CLINICAL DATA:  RIGHT low back pain radiating to groin for 1 week. No injury. EXAM: LUMBAR SPINE - COMPLETE 4+ VIEW COMPARISON:  None. FINDINGS: Five non rib-bearing lumbar-type vertebral bodies are intact. Minimal grade 1 L4-5 anterolisthesis. No spondylolysis. Maintained lumbar lordosis. Moderate L5-S1 disc height loss with endplate sclerosis and marginal spurring compatible with degenerative disc. No destructive bony lesions. Severe lower lumbar facet arthropathy. Severe RIGHT and moderate LEFT sacroiliac osteoarthrosis. Included prevertebral and paraspinal soft tissue planes are non-suspicious. Gluteal injection granulomas. IMPRESSION: 1. No acute fracture deformity. Minimal grade 1 L4-5 anterolisthesis without spondylolysis. 2. Moderate L5-S1 degenerative disc with severe lower lumbar facet arthropathy. 3. Severe RIGHT sacroiliac osteoarthrosis. Electronically Signed   By: Elon Alas M.D.   On: 12/24/2017 22:42   Dg Hips Bilat With Pelvis 3-4 Views  Result Date: 12/24/2017 CLINICAL DATA:  RIGHT low back pain radiating to groin. EXAM: DG HIP (WITH OR WITHOUT PELVIS) 3-4V BILAT COMPARISON:  None. FINDINGS: Please note, RIGHT and LEFT markers are digitally placed. Femoral heads are well formed and located. Hip joint spaces are intact, mild osteoarthrosis with superolateral acetabular spurring. Severe RIGHT sacroiliac osteoarthrosis, mild on the LEFT. Degenerative change of the lumbar spine, please see today's dedicated radiograph. No destructive bony lesions. Included soft tissue planes are non-suspicious. IMPRESSION: 1. No fracture deformity or dislocation. 2. Mild bilateral hip osteoarthrosis. 3. Severe RIGHT sacroiliac osteoarthrosis. Electronically Signed   By: Elon Alas M.D.   On: 12/24/2017 16:53     Assessment & Plan:   Geneive was seen today for follow-up.  Diagnoses and all orders for this visit:  Nipple pain -     US BREAST LTD UNI LEFT INC AXILLA; Future  Allergic rhinitis, unspecified seasonality, unspecified trigger -     cetirizine (ZYRTEC ALLERGY) 10 MG tablet; Take 1 tablet (10 mg total) by mouth daily.   I am having Colena L. Magoon start on cetirizine. I am also having her maintain her atenolol, ibuprofen, fluticasone, Acetaminophen (TYLENOL EXTRA STRENGTH PO), omega-3 fish oil, hydrALAZINE, esomeprazole, levothyroxine, ergocalciferol, furosemide, potassium chloride, hydrochlorothiazide, methylPREDNISolone, and amLODipine.  Meds ordered this encounter  Medications  . cetirizine (ZYRTEC ALLERGY) 10 MG tablet    Sig: Take 1 tablet (10 mg total) by mouth daily.    Dispense:  30 tablet    Refill:  5    Order Specific Question:   Supervising Provider    Answer:   Lucille Passy [[id number]]    Follow-up: Return in about 3 months (around 06/13/2018) for HTN (45mins).  Wilfred Lacy, NP

## 2018-03-14 ENCOUNTER — Other Ambulatory Visit (INDEPENDENT_AMBULATORY_CARE_PROVIDER_SITE_OTHER): Payer: Self-pay | Admitting: Family Medicine

## 2018-03-14 ENCOUNTER — Ambulatory Visit (INDEPENDENT_AMBULATORY_CARE_PROVIDER_SITE_OTHER): Payer: BLUE CROSS/BLUE SHIELD | Admitting: Family Medicine

## 2018-03-14 VITALS — BP 188/123 | HR 82 | Temp 97.8°F | Ht 62.0 in | Wt 348.0 lb

## 2018-03-14 DIAGNOSIS — R7303 Prediabetes: Secondary | ICD-10-CM

## 2018-03-14 DIAGNOSIS — E213 Hyperparathyroidism, unspecified: Secondary | ICD-10-CM | POA: Diagnosis not present

## 2018-03-14 DIAGNOSIS — E559 Vitamin D deficiency, unspecified: Secondary | ICD-10-CM | POA: Diagnosis not present

## 2018-03-14 DIAGNOSIS — Z6841 Body Mass Index (BMI) 40.0 and over, adult: Secondary | ICD-10-CM

## 2018-03-14 DIAGNOSIS — I10 Essential (primary) hypertension: Secondary | ICD-10-CM

## 2018-03-14 DIAGNOSIS — Z9189 Other specified personal risk factors, not elsewhere classified: Secondary | ICD-10-CM | POA: Diagnosis not present

## 2018-03-14 MED ORDER — METFORMIN HCL 500 MG PO TABS: 500.0000 mg | ORAL_TABLET | Freq: Every day | ORAL | 0 refills | Status: DC

## 2018-03-15 ENCOUNTER — Ambulatory Visit: Payer: BLUE CROSS/BLUE SHIELD | Admitting: Gastroenterology

## 2018-03-15 ENCOUNTER — Encounter: Payer: Self-pay | Admitting: Gastroenterology

## 2018-03-15 VITALS — BP 154/92 | HR 77 | Ht 62.0 in | Wt 348.8 lb

## 2018-03-15 DIAGNOSIS — Z86010 Personal history of colonic polyps: Secondary | ICD-10-CM

## 2018-03-15 DIAGNOSIS — R1084 Generalized abdominal pain: Secondary | ICD-10-CM

## 2018-03-15 DIAGNOSIS — K219 Gastro-esophageal reflux disease without esophagitis: Secondary | ICD-10-CM | POA: Diagnosis not present

## 2018-03-15 DIAGNOSIS — R1310 Dysphagia, unspecified: Secondary | ICD-10-CM | POA: Diagnosis not present

## 2018-03-15 MED ORDER — PEG 3350-KCL-NABCB-NACL-NASULF 236 G PO SOLR: 4000.0000 mL | Freq: Once | ORAL | 0 refills | Status: AC

## 2018-03-15 NOTE — Patient Instructions (Addendum)
You have been scheduled for an abdominal ultrasound at [facility] (1st floor of hospital) on 03/20/18 at 9:30am. Please arrive 15 minutes prior to your appointment for registration. Make certain not to have anything to eat or drink 6 hours prior to your appointment. Should you need to reschedule your appointment, please contact radiology at [phone]. This test typically takes about 30 minutes to perform.  You have been scheduled for an endoscopy and colonoscopy. Please follow the written instructions given to you at your visit today. Please pick up your prep supplies at the pharmacy within the next 1-3 days. If you use inhalers (even only as needed), please bring them with you on the day of your procedure. Your physician has requested that you go to www.startemmi.com and enter the access code given to you at your visit today. This web site gives a general overview about your procedure. However, you should still follow specific instructions given to you by our office regarding your preparation for the procedure.

## 2018-03-15 NOTE — Progress Notes (Signed)
[place] VISIT   Primary Care Provider [name], Charlene Brooke, NP [place] [address] [phone]  Referring Provider [name], Charlene Brooke, NP [address] [place], [place] [address] [phone]  Patient Profile: Madison Palmer is a 52 y.o. female with a pmh significant for morbid obesity (s/p prior Lap-Band and now off), GERD, MDD, HLD, HTN, Colon Polyps (TAs & Hyperplastic), Thyroid Disease, Diverticulosis, PCOS, reported incomplete LES relaxation (not consistent with EGJ outflow obstruction as not [place] to classification).  The patient presents to the [facility] for an evaluation and management of problem(s) noted below:  Problem List 1. Generalized postprandial abdominal pain   2. Gastroesophageal reflux disease, esophagitis presence not specified   3. Dysphagia, unspecified type   4. Hx of adenomatous colonic polyps     History of Present Illness: Please see prior consultation note for full details of HPI and summary of prior GI notation evaluation.  Interval History The patient returns for scheduled follow-up.  She has been taking twice daily PPI as prescribed at last clinic visit.  She does feel that this has helped with her symptoms of GERD a bit more than when she was just on once daily dosing.  She has had some intermittent short episodes of solid food dysphagia.  No odynophagia.  Thinks it passed without too much of a problem but she does note this today when previously we did not feel it was ongoing as much.  She describes a discomfort in her mid abdomen that is painful at times when she presses in the region.  This is a postprandial abdominal discomfort that can last an hour or so and if she presses in that particular region when she is having pain she may feel things worse.  She is not clear if this is her hiatal hernia.  She recently began working with the cold weight loss management group  and is currently on a prescribed eating program and in so doing has lost approximately 8 to 9 pounds.  She is very happy with this.  She has not noted any changes in her bowel habits.  When I reviewed her prior colonoscopy report the initial thought process for me was that a 5-year colonoscopy for surveillance of prior colon polyps was indicated based on one hyperplastic polyp and one tubular adenoma.  With that being said we did not know if the 10 mm polyp was an adenoma or hyperplastic and as such she actually would be due for 3-year follow-up in the setting of potentially a 10 mm adenomatous polyp.  GI Review of Systems Positive as above Negative for nausea, vomiting, odynophagia, jaundice, melena, hematochezia   Review of Systems General: Denies fevers/chills Cardiovascular: Denies chest pain Pulmonary: Shortness of breath is stable Gastroenterological: See HPI Genitourinary: Denies darkened urine Dermatological: No jaundice  Psychological: Mood is stable Musculoskeletal: Positive for long-standing arthralgias but no new arthralgias   Medications Current Outpatient Medications  Medication Sig Dispense Refill  . Acetaminophen (TYLENOL EXTRA STRENGTH PO) Take by mouth. Takes 2 tablets 2-3 times a day.    Marland Kitchen amLODipine (NORVASC) 2.5 MG tablet Take 1 tablet (2.5 mg total) by mouth daily. 90 tablet 23  . atenolol (TENORMIN) 25 MG tablet TK 1 T PO D  0  . cetirizine (ZYRTEC ALLERGY) 10 MG tablet Take 1 tablet (10 mg total) by mouth daily. 30 tablet 5  . ergocalciferol (VITAMIN D2) 50000 units capsule Take 1 capsule (50,000 Units total) by mouth  3 (three) times a week. 12 capsule 0  . esomeprazole (NEXIUM) 40 MG capsule Take 1 capsule (40 mg total) by mouth 2 (two) times daily. 180 capsule 0  . fluticasone (FLONASE) 50 MCG/ACT nasal spray Place 2 sprays into both nostrils daily.    . hydrALAZINE (APRESOLINE) 50 MG tablet Take 1tab in morning and afternoon, and 2tabs at bedtime 120 tablet 6  .  hydrochlorothiazide (HYDRODIURIL) 50 MG tablet Take 50 mg by mouth daily.    Marland Kitchen ibuprofen (ADVIL,MOTRIN) 600 MG tablet Take 600 mg by mouth every 6 (six) hours as needed.    Marland Kitchen levothyroxine (SYNTHROID, LEVOTHROID) 75 MCG tablet TK 1 T PO QD  MONDAY-FRIDAY  0  . metFORMIN (GLUCOPHAGE) 500 MG tablet Take 1 tablet (500 mg total) by mouth daily with breakfast. 30 tablet 0  . omega-3 fish oil (MAXEPA) 1000 MG CAPS capsule Take 2 capsules (2,000 mg total) by mouth 2 (two) times daily. 90 each    No current facility-administered medications for this visit.     Allergies Allergies  Allergen Reactions  . Fentanyl Hives  . Levofloxacin Hives  . Midazolam Hives  . Pollen Extract     seasonal seasonal seasonal  . Atorvastatin     Muscle pain in legs   . Rosuvastatin     Abdominal pain    Histories Past Medical History:  Diagnosis Date  . Anemia   . Anxiety   . Arrhythmia    tachycardia  . Arthritis   . Chickenpox   . Depression   . Diverticulitis   . GERD (gastroesophageal reflux disease)   . Glaucoma   . Hyperlipidemia   . Hyperparathyroidism ([place])   . Hypertension   . Inflammatory polyps of colon ([place])   . LVH (left ventricular hypertrophy)   . Lymphedema   . PCOS (polycystic ovarian syndrome)   . Prediabetes   . Recurrent UTI   . Sleep apnea    CPAP  . Thyroid disease   . Vitamin D deficiency    Past Surgical History:  Procedure Laterality Date  . BREAST BIOPSY  2015  . CESAREAN SECTION  2004  . INNER EAR SURGERY     ear and sinus surgery  . LAPAROSCOPIC REPAIR AND REMOVAL OF GASTRIC BAND    . OOPHORECTOMY Left   . TONSILLECTOMY AND ADENOIDECTOMY     Social History   Socioeconomic History  . Marital status: Legally Separated    Spouse name: Not on file  . Number of children: 1  . Years of education: Not on file  . Highest education level: Not on file  Occupational History  . Occupation: Unemployed  Social Needs  . Financial resource strain: Not on file    . Food insecurity:    Worry: Not on file    Inability: Not on file  . Transportation needs:    Medical: Not on file    Non-medical: Not on file  Tobacco Use  . Smoking status: Never Smoker  . Smokeless tobacco: Never Used  Substance and Sexual Activity  . Alcohol use: Yes    Comment: social  . Drug use: No  . Sexual activity: Not Currently  Lifestyle  . Physical activity:    Days per week: Not on file    Minutes per session: Not on file  . Stress: Not on file  Relationships  . Social connections:    Talks on phone: Not on file    Gets together: Not on file  Attends religious service: Not on file    Active member of club or organization: Not on file    Attends meetings of clubs or organizations: Not on file    Relationship status: Not on file  . Intimate partner violence:    Fear of current or ex partner: Not on file    Emotionally abused: Not on file    Physically abused: Not on file    Forced sexual activity: Not on file  Other Topics Concern  . Not on file  Social History Narrative   Lives with son and companion.     Family History  Adopted: Yes   I have reviewed her medical, social, and family history in detail and updated the electronic medical record as necessary.    PHYSICAL EXAMINATION  BP (!) 154/92   Pulse 77   Ht 5\' 2"  (1.575 m)   Wt (!) 348 lb 12.8 oz (158.2 kg)   BMI 63.80 kg/m   Filed Weights   03/15/18 1112  Weight: (!) 348 lb 12.8 oz (158.2 kg)  GEN: Ambulated from chair to table with less issues than last time PSYCH: Cooperative, without pressured speech EYE: Conjunctivae pink, sclerae anicteric ENT: MMM CV: RR without R/Gs  RESP: No adventitious breath sounds present GI: NABS, soft, obese, rounded, very minimal tenderness in the mid epigastric region upon deep palpation, no rebound or guarding MSK/EXT: Bilateral lower extremity edema present SKIN: No jaundice NEURO:  Alert & Oriented x 3, no focal deficits   REVIEW OF DATA  I  reviewed the following data at the time of this encounter:  GI Procedures and Studies  No new relevant studies  Laboratory Studies  Reviewed in epic  Imaging Studies  No relevant studies   ASSESSMENT  Ms. Gruner is a 52 y.o. female with a pmh significant for morbid obesity (s/p prior Lap-Band and now off), GERD, MDD, HLD, HTN, Colon Polyps (TAs & Hyperplastic), Thyroid Disease, Diverticulosis, PCOS, reported incomplete LES relaxation (not consistent with EGJ outflow obstruction as not [place] to classification).  The patient is seen today for evaluation and management of:  1. Generalized postprandial abdominal pain   2. Gastroesophageal reflux disease, esophagitis presence not specified   3. Dysphagia, unspecified type   4. Hx of adenomatous colonic polyps    The patient seems to be doing better on twice daily Nexium.  With that being said I think it is reasonable in the setting of her persistent symptoms to reevaluate her upper GI tract with an upper endoscopy.  We will plan to take EOE biopsies as well as evaluate for her hiatal hernia to evaluate and see if this is worse than prior and a potential reason for issues of intermittent dysphagia.  She previously had a near documentation of the [place] outflow obstruction however did not meet [place] classification for a true manometric disorder and thus was not treated with any further therapies.  We discussed briefly about repeat manometry depending on how her symptoms move forward in the near future.  She would like to hold on this as I think this is reasonable also.  At the discussed above previously she had a colonoscopy with a 5 mm and 10 mm polyp we do not know which of those was an adenoma however if this was a 10 mm adenoma she would benefit from a 3-year follow-up colonoscopy.  As such we will plan to proceed with both an upper and lower endoscopic evaluation to evaluate for history of colon polyps  and as discussed above her issues of abdominal  upset.  We will plan an abdominal ultrasound however if her body habitus does not allow adequate penetration of her organs then we will plan to proceed with a CT abdomen pelvis with contrast to evaluate the abdominal organs.  She may benefit from a timed barium swallow but for now we will hold on that.  All patient questions were answered, to the best of my ability, and the patient agrees to the aforementioned plan of action with follow-up as indicated.   PLAN  1. Generalized postprandial abdominal pain - US Abdomen Complete; Future - Consider CT-Abdomen/Pelvis with contrast if unable to see entire abdominal organs for pathology - Holding on SF-GES for now  2. Gastroesophageal reflux disease, esophagitis presence not specified - Continue Nexium BID for now - EGD to be scheduled at next available for patient in hospital - Continue Weight loss as she is doing  3. Dysphagia, unspecified type - Plan for EoE biopsies and evaluation of previously noted HH - Holding on repeat Manometry for now  4. Hx of adenomatous colonic polyps - Proceed with Colonoscopy at next available for patient for surveillance   Orders Placed This Encounter  Procedures  . US Abdomen Complete  . Ambulatory referral to Gastroenterology    New Prescriptions   No medications on file   Modified Medications   No medications on file    Planned Follow Up: No follow-ups on file.   Justice Britain, MD [facility] Office # [phone]

## 2018-03-18 ENCOUNTER — Encounter: Payer: Self-pay | Admitting: Nurse Practitioner

## 2018-03-19 ENCOUNTER — Encounter: Payer: Self-pay | Admitting: Gastroenterology

## 2018-03-19 ENCOUNTER — Telehealth: Payer: Self-pay | Admitting: Gastroenterology

## 2018-03-19 DIAGNOSIS — R1310 Dysphagia, unspecified: Secondary | ICD-10-CM | POA: Insufficient documentation

## 2018-03-19 NOTE — Telephone Encounter (Signed)
Left message on machine to call back  

## 2018-03-19 NOTE — Telephone Encounter (Signed)
The pt states she will leave the appt as scheduled.  She will try to get someone to stay with her. If she cant she will call back to reschedule.

## 2018-03-20 ENCOUNTER — Ambulatory Visit (HOSPITAL_COMMUNITY)
Admission: RE | Admit: 2018-03-20 | Discharge: 2018-03-20 | Disposition: A | Discharge status: Home / Self Care | Payer: BLUE CROSS/BLUE SHIELD | Source: Ambulatory Visit | Attending: Gastroenterology | Admitting: Gastroenterology

## 2018-03-20 DIAGNOSIS — R1084 Generalized abdominal pain: Secondary | ICD-10-CM | POA: Diagnosis present

## 2018-03-20 DIAGNOSIS — K8020 Calculus of gallbladder without cholecystitis without obstruction: Secondary | ICD-10-CM | POA: Insufficient documentation

## 2018-03-21 ENCOUNTER — Encounter: Payer: Self-pay | Admitting: Rheumatology

## 2018-03-21 ENCOUNTER — Ambulatory Visit: Payer: BLUE CROSS/BLUE SHIELD | Admitting: Rheumatology

## 2018-03-21 ENCOUNTER — Telehealth: Payer: Self-pay

## 2018-03-21 VITALS — BP 179/100 | HR 72 | Resp 17 | Ht 62.0 in | Wt 342.0 lb

## 2018-03-21 DIAGNOSIS — R768 Other specified abnormal immunological findings in serum: Secondary | ICD-10-CM

## 2018-03-21 DIAGNOSIS — Z860100 Personal history of colon polyps, unspecified: Secondary | ICD-10-CM

## 2018-03-21 DIAGNOSIS — M170 Bilateral primary osteoarthritis of knee: Secondary | ICD-10-CM

## 2018-03-21 DIAGNOSIS — K5790 Diverticulosis of intestine, part unspecified, without perforation or abscess without bleeding: Secondary | ICD-10-CM

## 2018-03-21 DIAGNOSIS — G4733 Obstructive sleep apnea (adult) (pediatric): Secondary | ICD-10-CM

## 2018-03-21 DIAGNOSIS — E213 Hyperparathyroidism, unspecified: Secondary | ICD-10-CM

## 2018-03-21 DIAGNOSIS — R7689 Other specified abnormal immunological findings in serum: Secondary | ICD-10-CM

## 2018-03-21 DIAGNOSIS — F419 Anxiety disorder, unspecified: Secondary | ICD-10-CM

## 2018-03-21 DIAGNOSIS — R5383 Other fatigue: Secondary | ICD-10-CM

## 2018-03-21 DIAGNOSIS — Z9989 Dependence on other enabling machines and devices: Secondary | ICD-10-CM

## 2018-03-21 DIAGNOSIS — E782 Mixed hyperlipidemia: Secondary | ICD-10-CM

## 2018-03-21 DIAGNOSIS — Z8639 Personal history of other endocrine, nutritional and metabolic disease: Secondary | ICD-10-CM

## 2018-03-21 DIAGNOSIS — E282 Polycystic ovarian syndrome: Secondary | ICD-10-CM

## 2018-03-21 DIAGNOSIS — Z8719 Personal history of other diseases of the digestive system: Secondary | ICD-10-CM

## 2018-03-21 DIAGNOSIS — I10 Essential (primary) hypertension: Secondary | ICD-10-CM

## 2018-03-21 DIAGNOSIS — F32A Depression, unspecified: Secondary | ICD-10-CM

## 2018-03-21 DIAGNOSIS — M5136 Other intervertebral disc degeneration, lumbar region: Secondary | ICD-10-CM | POA: Diagnosis not present

## 2018-03-21 DIAGNOSIS — M51369 Other intervertebral disc degeneration, lumbar region without mention of lumbar back pain or lower extremity pain: Secondary | ICD-10-CM

## 2018-03-21 DIAGNOSIS — K224 Dyskinesia of esophagus: Secondary | ICD-10-CM

## 2018-03-21 DIAGNOSIS — E042 Nontoxic multinodular goiter: Secondary | ICD-10-CM

## 2018-03-21 DIAGNOSIS — F329 Major depressive disorder, single episode, unspecified: Secondary | ICD-10-CM

## 2018-03-21 DIAGNOSIS — I890 Lymphedema, not elsewhere classified: Secondary | ICD-10-CM

## 2018-03-21 DIAGNOSIS — E559 Vitamin D deficiency, unspecified: Secondary | ICD-10-CM

## 2018-03-21 NOTE — Telephone Encounter (Signed)
If labs are normal, please call patient with results per Dr. Estanislado Pandy. If normal, follow up can be canceled.

## 2018-03-21 NOTE — Telephone Encounter (Signed)
Noted  

## 2018-03-21 NOTE — Progress Notes (Signed)
Office: [phone]  /  Fax: [phone]   HPI:   Chief Complaint: OBESITY Madison Palmer is here to discuss her progress with her obesity treatment plan. She is on the Category 3 plan and is following her eating plan approximately 85 to 90 % of the time. She states she is exercising 0 minutes 0 times per week. Devinne reports that she had a few indulgences and was bored with routine. She is looking for ways to season dinner and she did have some of the options from the snack sheet.  Her weight is (!) 348 lb (157.9 kg) today and has had a weight loss of 9 pounds over a period of 2 weeks since her last visit. She has lost 9 lbs since starting treatment with [place].  Hypertension Madison Palmer is a 52 y.o. female with hypertension. She is working on weight loss to help control her blood pressure with the goal of decreasing her risk of heart attack and stroke. Noemi's blood pressures at home range 120 to 140's / 80 to 90's. Noraa denies chest pain, chest pressure, or headaches.  Vitamin D deficiency Madison Palmer has a diagnosis of vitamin D deficiency. She is currently taking vit D and on 50,000 IU 3 times a week by Dr. Loanne Drilling. She has bloodwork scheduled for 03/19/18. She denies nausea, vomiting, or muscle weakness.  Hyperparathyroidism Madison Palmer has a diagnosis of hyperparathyroidism. Her calcium level was 10.6 on 02/27/18. She will see her endocrinologist on November 18th.  Pre-Diabetes Madison Palmer has a diagnosis of pre-diabetes based on her elevated Hgb A1c and was informed this puts her at greater risk of developing diabetes. She first heard the diagnosis 2 to 3 years ago. Her Hgb A1c was 6.0 and Insulin was 9.6 on 02/27/18. She is not taking metformin currently and continues to work on diet and exercise to decrease risk of diabetes.   At risk for diabetes Madison Palmer is at higher than average risk for developing diabetes due to her pre-diabetes and obesity. She currently denies polyuria or  polydipsia.  ALLERGIES: Allergies  Allergen Reactions  . Fentanyl Hives  . Levofloxacin Hives  . Midazolam Hives  . Pollen Extract     seasonal seasonal seasonal  . Atorvastatin     Muscle pain in legs   . Rosuvastatin     Abdominal pain    MEDICATIONS: Current Outpatient Medications on File Prior to Visit  Medication Sig Dispense Refill  . Acetaminophen (TYLENOL EXTRA STRENGTH PO) Take by mouth. Takes 2 tablets 2-3 times a day.    Marland Kitchen amLODipine (NORVASC) 2.5 MG tablet Take 1 tablet (2.5 mg total) by mouth daily. 90 tablet 23  . atenolol (TENORMIN) 25 MG tablet TK 1 T PO D  0  . cetirizine (ZYRTEC ALLERGY) 10 MG tablet Take 1 tablet (10 mg total) by mouth daily. 30 tablet 5  . ergocalciferol (VITAMIN D2) 50000 units capsule Take 1 capsule (50,000 Units total) by mouth 3 (three) times a week. 12 capsule 0  . esomeprazole (NEXIUM) 40 MG capsule Take 1 capsule (40 mg total) by mouth 2 (two) times daily. 180 capsule 0  . fluticasone (FLONASE) 50 MCG/ACT nasal spray Place 2 sprays into both nostrils daily.    . hydrALAZINE (APRESOLINE) 50 MG tablet Take 1tab in morning and afternoon, and 2tabs at bedtime 120 tablet 6  . hydrochlorothiazide (HYDRODIURIL) 50 MG tablet Take 50 mg by mouth daily.    Marland Kitchen ibuprofen (ADVIL,MOTRIN) 600 MG tablet Take 600 mg by mouth every 6 (  six) hours as needed.    Marland Kitchen levothyroxine (SYNTHROID, LEVOTHROID) 75 MCG tablet TK 1 T PO QD  MONDAY-FRIDAY  0  . omega-3 fish oil (MAXEPA) 1000 MG CAPS capsule Take 2 capsules (2,000 mg total) by mouth 2 (two) times daily. 90 each    No current facility-administered medications on file prior to visit.     PAST MEDICAL HISTORY: Past Medical History:  Diagnosis Date  . Anemia   . Anxiety   . Arrhythmia    tachycardia  . Arthritis   . Chickenpox   . Depression   . Diverticulitis   . GERD (gastroesophageal reflux disease)   . Glaucoma   . Hyperlipidemia   . Hyperparathyroidism ([place])   . Hypertension   .  Inflammatory polyps of colon ([place])   . LVH (left ventricular hypertrophy)   . Lymphedema   . PCOS (polycystic ovarian syndrome)   . Prediabetes   . Recurrent UTI   . Sleep apnea    CPAP  . Thyroid disease   . Vitamin D deficiency     PAST SURGICAL HISTORY: Past Surgical History:  Procedure Laterality Date  . BREAST BIOPSY  2015  . CESAREAN SECTION  2004  . INNER EAR SURGERY     ear and sinus surgery  . LAPAROSCOPIC REPAIR AND REMOVAL OF GASTRIC BAND    . OOPHORECTOMY Left   . TONSILLECTOMY AND ADENOIDECTOMY      SOCIAL HISTORY: Social History   Tobacco Use  . Smoking status: Never Smoker  . Smokeless tobacco: Never Used  Substance Use Topics  . Alcohol use: Yes    Comment: social  . Drug use: No    FAMILY HISTORY: Family History  Adopted: Yes    ROS: Review of Systems  Constitutional: Positive for weight loss.  Cardiovascular: Negative for chest pain.       Negative for chest pressure.  Gastrointestinal: Negative for nausea and vomiting.  Genitourinary:       Negative for polyuria.  Musculoskeletal:       Negative for muscle weakness.  Neurological: Negative for headaches.  Endo/Heme/Allergies: Negative for polydipsia.    PHYSICAL EXAM: Blood pressure (!) 188/123, pulse 82, temperature 97.8 F (36.6 C), temperature source Oral, height 5\' 2"  (1.575 m), weight (!) 348 lb (157.9 kg), SpO2 90 %. Body mass index is 63.65 kg/m. Physical Exam  Constitutional: She is oriented to person, place, and time. She appears well-developed and well-nourished.  Cardiovascular: Normal rate.  Pulmonary/Chest: Effort normal.  Musculoskeletal: Normal range of motion.  Neurological: She is oriented to person, place, and time.  Skin: Skin is warm and dry.  Psychiatric: She has a normal mood and affect. Her behavior is normal.  Vitals reviewed.   RECENT LABS AND TESTS: BMET    Component Value Date/Time   NA 142 02/27/2018 1233   K 3.9 02/27/2018 1233   CL 94 (L)  02/27/2018 1233   CO2 29 02/27/2018 1233   GLUCOSE 100 (H) 02/27/2018 1233   GLUCOSE 115 (H) 02/20/2018 1536   BUN 15 02/27/2018 1233   CREATININE 1.02 (H) 02/27/2018 1233   CREATININE 0.94 12/21/2017 1640   CALCIUM 10.6 (H) 02/27/2018 1233   GFRNONAA 64 02/27/2018 1233   GFRAA 74 02/27/2018 1233   Lab Results  Component Value Date   HGBA1C 6.0 (H) 02/27/2018   HGBA1C 5.7 09/10/2017   HGBA1C 5.9 05/29/2017   Lab Results  Component Value Date   INSULIN 9.6 02/27/2018   CBC  Component Value Date/Time   WBC 7.0 02/20/2018 1536   RBC 5.46 (H) 02/20/2018 1536   HGB 15.1 (H) 02/20/2018 1536   HCT 46.5 (H) 02/20/2018 1536   PLT 280.0 02/20/2018 1536   MCV 85.1 02/20/2018 1536   MCHC 32.4 02/20/2018 1536   RDW 15.7 (H) 02/20/2018 1536   LYMPHSABS 1.0 02/20/2018 1536   MONOABS 0.4 02/20/2018 1536   EOSABS 0.0 02/20/2018 1536   BASOSABS 0.1 02/20/2018 1536   Iron/TIBC/Ferritin/ %Sat    Component Value Date/Time   IRON 44 02/20/2018 1536   IRON 81 05/29/2017   TIBC 347 05/29/2017   IRONPCTSAT 8.8 (L) 02/20/2018 1536   Lipid Panel     Component Value Date/Time   CHOL 196 02/27/2018 1233   TRIG 142 02/27/2018 1233   HDL 46 02/27/2018 1233   CHOLHDL 5 09/10/2017 1156   VLDL 26.6 09/10/2017 1156   LDLCALC 122 (H) 02/27/2018 1233   Hepatic Function Panel     Component Value Date/Time   PROT 6.9 02/27/2018 1233   ALBUMIN 4.3 02/27/2018 1233   AST 22 02/27/2018 1233   ALT 27 02/27/2018 1233   ALKPHOS 100 02/27/2018 1233   BILITOT 0.9 02/27/2018 1233      Component Value Date/Time   TSH 1.450 02/27/2018 1233   TSH 0.97 12/11/2017 1158   TSH 1.28 05/29/2017   Results for JAVONNE, LOUISSAINT (MRN [id number]) as of 03/21/2018 09:28  Ref. Range 05/29/2017 00:00  Vitamin D, 25-Hydroxy Unknown 18   ASSESSMENT AND PLAN: Essential hypertension  Vitamin D deficiency  Hyperparathyroidism ([place])  Prediabetes - Plan: metFORMIN (GLUCOPHAGE) 500 MG tablet  At risk for  diabetes mellitus  Class 3 severe obesity with serious comorbidity and body mass index (BMI) of 50.0 to 59.9 in adult, unspecified obesity type ([place])  PLAN:  Hypertension We discussed sodium restriction, working on healthy weight loss, and a regular exercise program as the means to achieve improved blood pressure control.  We will continue to monitor her blood pressure as well as her progress with the above lifestyle modifications. She will continue her medications as prescribed and will watch for signs of hypotension as she continues her lifestyle modifications. We will follow up with her blood pressure readings at her next appointment in 2 weeks. Roselin agreed with this plan and agreed to follow up as directed.  Vitamin D Deficiency [place] was informed that low vitamin D levels contributes to fatigue and are associated with obesity, breast, and colon cancer. She agrees to continue to take prescription Vit D @50 ,000 IU every week and will follow up for routine testing of vitamin D, at least 2-3 times per year. She was informed of the risk of over-replacement of vitamin D and agrees to not increase her dose unless she discusses this with [place] first. We will look at her labs at her next appointment. Sundra agrees with this plan.  Hyperparathyroidism Shamya agrees to follow up at her next visit at the agreed upon time in 2 weeks.  Pre-Diabetes Janele will continue to work on weight loss, exercise, and decreasing simple carbohydrates in her diet to help decrease the risk of diabetes. We discussed metformin including benefits and risks. She was informed that eating too many simple carbohydrates or too many calories at one sitting increases the likelihood of GI side effects. Shanise agreed to take metformin 500mg  PO qAM #30 with no refills and a prescription was written today. Avelyn agreed to follow up with [place] as directed to monitor  her progress.  Diabetes risk counseling Aleiah was given extended (15  minutes) diabetes prevention counseling today. She is 52 y.o. female and has risk factors for diabetes including pre-diabetes and obesity. We discussed intensive lifestyle modifications today with an emphasis on weight loss as well as increasing exercise and decreasing simple carbohydrates in her diet.  Obesity [place] is currently in the action stage of change. As such, her goal is to continue with weight loss efforts. She has agreed to follow the Category 3 plan. Pang has been instructed to work up to a goal of 150 minutes of combined cardio and strengthening exercise per week for weight loss and overall health benefits. We discussed the following Behavioral Modification Strategies today: increasing lean protein intake, increasing vegetables, work on meal planning and easy cooking plans, and planning for success.  Victoria has agreed to follow up with our clinic in 2 weeks. She was informed of the importance of frequent follow up visits to maximize her success with intensive lifestyle modifications for her multiple health conditions.   OBESITY BEHAVIORAL INTERVENTION VISIT  Today's visit was # 2   Starting weight: 357 lbs Starting date: 02/27/18 Today's weight : Weight: (!) 348 lb (157.9 kg)  Today's date: 03/14/2018 Total lbs lost to date: 9  ASK: We discussed the diagnosis of obesity with [name] today and Simrat agreed to give [place] permission to discuss obesity behavioral modification therapy today.  ASSESS: Annisha has the diagnosis of obesity and her BMI today is 63.78. Nakyiah is in the action stage of change.   ADVISE: Lavora was educated on the multiple health risks of obesity as well as the benefit of weight loss to improve her health. She was advised of the need for long term treatment and the importance of lifestyle modifications to improve her current health and to decrease her risk of future health problems.  AGREE: Multiple dietary modification options and  treatment options were discussed and Nadalie agreed to follow the recommendations documented in the above note.  ARRANGE: Coco was educated on the importance of frequent visits to treat obesity as outlined per CMS and USPSTF guidelines and agreed to schedule her next follow up appointment today.  I, Marcille Blanco, am acting as Location manager for Eber Jones, MD  I have reviewed the above documentation for accuracy and completeness, and I agree with the above. - Ilene Qua, MD

## 2018-03-25 ENCOUNTER — Ambulatory Visit (INDEPENDENT_AMBULATORY_CARE_PROVIDER_SITE_OTHER): Payer: BLUE CROSS/BLUE SHIELD

## 2018-03-25 ENCOUNTER — Other Ambulatory Visit: Payer: Self-pay | Admitting: Gastroenterology

## 2018-03-25 ENCOUNTER — Telehealth: Payer: Self-pay | Admitting: Gastroenterology

## 2018-03-25 ENCOUNTER — Other Ambulatory Visit (INDEPENDENT_AMBULATORY_CARE_PROVIDER_SITE_OTHER): Payer: BLUE CROSS/BLUE SHIELD

## 2018-03-25 DIAGNOSIS — E213 Hyperparathyroidism, unspecified: Secondary | ICD-10-CM | POA: Diagnosis not present

## 2018-03-25 DIAGNOSIS — I493 Ventricular premature depolarization: Secondary | ICD-10-CM | POA: Diagnosis not present

## 2018-03-25 LAB — RNP ANTIBODY: Ribonucleic Protein(ENA) Antibody, IgG: <1 AI

## 2018-03-25 LAB — BETA-2 GLYCOPROTEIN ANTIBODIES
Beta-2 Glyco 1 IgA: <9 SAU (ref <=20)
Beta-2 Glyco 1 IgM: <9 SMU (ref <=20)

## 2018-03-25 LAB — RHEUMATOID FACTOR: RHEUMATOID FACTOR: 36 [IU]/mL — AB (ref <14)

## 2018-03-25 LAB — ANTI-SCLERODERMA ANTIBODY: Scleroderma (Scl-70) (ENA) Antibody, IgG: <1 AI

## 2018-03-25 LAB — PROTEIN ELECTROPHORESIS, SERUM, WITH REFLEX
ALBUMIN ELP: 3.9 g/dL (ref 3.8–4.8)
ALPHA 1: 0.3 g/dL (ref 0.2–0.3)
ALPHA 2: 0.7 g/dL (ref 0.5–0.9)
BETA 2: 0.3 g/dL (ref 0.2–0.5)
Beta Globulin: 0.4 g/dL (ref 0.4–0.6)
Gamma Globulin: 1 g/dL (ref 0.8–1.7)
Total Protein: 6.7 g/dL (ref 6.1–8.1)

## 2018-03-25 LAB — LUPUS ANTICOAGULANT EVAL W/ REFLEX
PTT-LA Screen: 36 s (ref <=40)
dRVVT: 37 s (ref <=45)

## 2018-03-25 LAB — CARDIOLIPIN ANTIBODIES, IGG, IGM, IGA
Anticardiolipin IgA: <11 [APL'U]
Anticardiolipin IgG: <14 [GPL'U]

## 2018-03-25 LAB — VITAMIN D 25 HYDROXY (VIT D DEFICIENCY, FRACTURES): VITD: 35.74 ng/mL (ref 30.00–100.00)

## 2018-03-25 LAB — CK: Total CK: 85 U/L (ref 29–143)

## 2018-03-25 LAB — SJOGRENS SYNDROME-B EXTRACTABLE NUCLEAR ANTIBODY: SSB (La) (ENA) Antibody, IgG: <1 AI

## 2018-03-25 LAB — SJOGRENS SYNDROME-A EXTRACTABLE NUCLEAR ANTIBODY: SSA (Ro) (ENA) Antibody, IgG: <1 AI

## 2018-03-25 LAB — CYCLIC CITRUL PEPTIDE ANTIBODY, IGG: Cyclic Citrullin Peptide Ab: <16 UNITS

## 2018-03-25 LAB — ANTI-SMITH ANTIBODY: ENA SM Ab Ser-aCnc: <1 AI

## 2018-03-25 LAB — C3 AND C4
C3 COMPLEMENT: 135 mg/dL (ref 83–193)
C4 COMPLEMENT: 33 mg/dL (ref 15–57)

## 2018-03-25 LAB — ANTI-DNA ANTIBODY, DOUBLE-STRANDED

## 2018-03-25 MED ORDER — PEG 3350-KCL-NA BICARB-NACL 420 G PO SOLR: 4000.0000 mL | ORAL | 0 refills | Status: DC

## 2018-03-25 NOTE — Telephone Encounter (Signed)
Spoke to patient. Sent in another prescription for bowel prep

## 2018-03-26 NOTE — Progress Notes (Signed)
We will discuss results at the follow-up visit.

## 2018-03-27 ENCOUNTER — Ambulatory Visit: Payer: BLUE CROSS/BLUE SHIELD | Admitting: Occupational Therapy

## 2018-03-27 DIAGNOSIS — I890 Lymphedema, not elsewhere classified: Secondary | ICD-10-CM | POA: Diagnosis not present

## 2018-03-27 LAB — PTH, INTACT AND CALCIUM
CALCIUM: 10.3 mg/dL (ref 8.6–10.4)
PTH: 148 pg/mL — ABNORMAL HIGH (ref 14–64)

## 2018-03-27 NOTE — Therapy (Signed)
[place] MAIN [facility] SERVICES [address] [place], [place], [address] Phone: [phone]   Fax:  [phone]  Occupational Therapy Treatment  Patient Details  Name: Madison Palmer MRN: [id number] Date of Birth: 12-04-1965 Referring Provider (OT): 2004   Encounter Date: 03/27/2018  OT End of Session - 03/27/18 1247    Visit Number  3    Number of Visits  4    Date for OT Re-Evaluation  05/29/18    OT Start Time  0930    OT Stop Time  0955    OT Time Calculation (min)  25 min    Activity Tolerance  Patient tolerated treatment well;No increased pain;Patient limited by pain;Treatment limited secondary to medical complications (Comment)    Behavior During Therapy  [facility] for tasks assessed/performed       Past Medical History:  Diagnosis Date  . Anemia   . Anxiety   . Arrhythmia    tachycardia  . Arthritis   . Chickenpox   . Depression   . Diverticulitis   . GERD (gastroesophageal reflux disease)   . Glaucoma   . Hyperlipidemia   . Hyperparathyroidism ([place])   . Hypertension   . Inflammatory polyps of colon ([place])   . LVH (left ventricular hypertrophy)   . Lymphedema   . PCOS (polycystic ovarian syndrome)   . Prediabetes   . Recurrent UTI   . Sleep apnea    CPAP  . Thyroid disease   . Vitamin D deficiency     Past Surgical History:  Procedure Laterality Date  . BREAST BIOPSY  2015  . CESAREAN SECTION  2004  . INNER EAR SURGERY     ear and sinus surgery  . LAPAROSCOPIC REPAIR AND REMOVAL OF GASTRIC BAND    . OOPHORECTOMY Left   . TONSILLECTOMY AND ADENOIDECTOMY      There were no vitals filed for this visit.  Subjective Assessment - 03/27/18 1243    Subjective   Madison Palmer returns to OT today for OT visit 2/4 for custom compression garment fitting.Pt is accompanied by her son. Pt reports that she received garments more than 10 days ago. She feels they may be a little big at the top edge because they tend to slide  down.. Pt also reports she has lost 9 pounds since last visit as she started attending a weight management program recently.    Pertinent History  morbid obesity, OA, OSA, BLE lymphedema, Hypothytoidism, HTN, Cardiac test results pending    Limitations  difficulty walking, impaired functional mobility and transfers, requires assistive devices to perform personal care and most basic ADLs, impaired instrumental ADLs, limited abiility to work and perform productive activities, decreased social participation and leisure pursuits, impaired body image    Repetition  Increases Symptoms    Currently in Pain?  No/denies    Pain Onset  --   2004                  OT Treatments/Exercises (OP) - 03/27/18 0001      ADLs   ADL Education Given  Yes      Manual Therapy   Manual Therapy  Edema management    Edema Management  Custom, BLE knee length compressi9on garment fitting and assessment             OT Education - 03/27/18 1247    Education Details  Continued skilled Pt/caregiver education  And LE ADL training throughout visit for lymphedema self  care/ home program, including compression wrapping, compression garment and device wear/care, lymphatic pumping ther ex, simple self-MLD, and skin care. Discussed progress towards goals.     Person(s) Educated  Patient    Methods  Explanation;Demonstration    Comprehension  Verbalized understanding;Returned demonstration          OT Long Term Goals - 03/04/18 1649      OT LONG TERM GOAL #1   Title  LE SELF CARE: Pt will ahieve modified independence with donning and doffing custom, knee length compression stockings using assistive devices and extra time at  garment fitting after skilled LE self-care education.    Baseline  Max A    Time  1    Period  Days    Status  New    Target Date  --   3rd OT treatment visit\           Plan - 03/27/18 1247    Clinical Impression Statement  BLE replacement , custom, knee length  elastic compression garments (Elvarex, ccl 2, open toe, oblique top edge, 5 cm silicone top band) appear to fit and function well. When fabric is correctly destributed up and over very rounded calf  , garment remains in place pretty well. Provided demonstration of garment adhesive at top band and provided resources. Unfortunately because Pt did not return for garment assessment within 10 window allowed by manufacturer, remakes are not an option. Pt will return for additional visit for measurements for [facility] replacement measurements. It is likely we will need to request additional visi through recert for fitting. Cont as per POC.    Occupational Profile and client history currently impacting functional performance  morbid obesity and arthritis pain also severly  limit mobility and ambulation    Occupational performance deficits (Please refer to evaluation for details):  IADL's;Leisure;Social Participation;Work;ADL's;Other;Education;Rest and Sleep   functional ambnulation and mobility, body image, role performance   Rehab Potential  Fair    Current Impairments/barriers affecting progress:  unknown cardiac status- test results and new cardiology referral pending    OT Frequency  Other (comment)   3-6 visits to perform anatomical measurements for custom gcompresssion garment replacements and return visits for fitting and adjustments PRN   OT Treatment/Interventions  Self-care/ADL training;DME and/or AE instruction;Other (comment);Patient/family education;Therapeutic activities    Clinical Decision Making  Multiple treatment options, significant modification of task necessary    Recommended Other Services  Replace existing custom , knee length,  flat knit, ccl 2 ( 25-35 mmHg) Jobst Elvarex compression stockings with larger stockings to accomodate weight gain.    Consulted and Agree with Plan of Care  Patient       Patient will benefit from skilled therapeutic intervention in order to improve the  following deficits and impairments:  Abnormal gait, Decreased balance, Decreased mobility, Decreased skin integrity, Difficulty walking, Obesity, Decreased range of motion, Increased edema, Pain, Other (comment), Decreased knowledge of use of DME, Impaired flexibility, Decreased knowledge of precautions  Visit Diagnosis: Lymphedema, not elsewhere classified    Problem List Patient Active Problem List   Diagnosis Date Noted  . DDD (degenerative disc disease), lumbar 03/21/2018  . Primary osteoarthritis of both knees 03/21/2018  . Dysphagia 03/19/2018  . PVC (premature ventricular contraction) 03/12/2018  . LVH (left ventricular hypertrophy) 03/12/2018  . Generalized edema 03/12/2018  . Bilateral carotid artery stenosis 03/12/2018  . Weight gain 01/25/2018  . Hemorrhoids 01/05/2018  . Generalized postprandial abdominal pain 01/05/2018  . Hiatal hernia 01/05/2018  .  Esophageal dysmotilities 01/05/2018  . Mixed hyperlipidemia 09/12/2017  . Multinodular goiter 07/27/2017  . Hyperparathyroidism ([place]) 06/13/2017  . Vitamin D deficiency 06/13/2017  . Hx of adenomatous colonic polyps 05/15/2017  . Eustachian tube dysfunction, right 05/15/2017  . Lymphatic edema 05/15/2017  . Hypertension 05/14/2017  . Anemia 05/11/2017  . Arrhythmia 05/11/2017  . Diverticulosis 05/11/2017  . GERD (gastroesophageal reflux disease) 05/11/2017  . Hypothyroidism 05/11/2017  . Lower extremity edema 05/11/2017  . Morbid obesity ([place]) 05/11/2017  . Polycystic ovary syndrome 05/11/2017  . OSA on CPAP 05/11/2017  . Neck pain 05/11/2017  . Allergic rhinitis 05/11/2017  . 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 [place], [place], [address] Phone: [phone]   Fax:  [phone]  Name: Madison Palmer MRN: [id number] Date of Birth:  July 15, 1965

## 2018-04-01 ENCOUNTER — Telehealth: Payer: Self-pay | Admitting: Gastroenterology

## 2018-04-01 NOTE — Telephone Encounter (Signed)
The procedure was moved to [facility] 05/08/18 at 1030 am.  The pt has been advised and re instructed.

## 2018-04-02 ENCOUNTER — Ambulatory Visit (INDEPENDENT_AMBULATORY_CARE_PROVIDER_SITE_OTHER): Payer: BLUE CROSS/BLUE SHIELD | Admitting: Family Medicine

## 2018-04-02 VITALS — BP 168/104 | HR 89 | Temp 98.4°F | Ht 62.0 in | Wt 333.0 lb

## 2018-04-02 DIAGNOSIS — E559 Vitamin D deficiency, unspecified: Secondary | ICD-10-CM

## 2018-04-02 DIAGNOSIS — R7303 Prediabetes: Secondary | ICD-10-CM

## 2018-04-02 DIAGNOSIS — Z9189 Other specified personal risk factors, not elsewhere classified: Secondary | ICD-10-CM

## 2018-04-02 DIAGNOSIS — Z6841 Body Mass Index (BMI) 40.0 and over, adult: Secondary | ICD-10-CM

## 2018-04-02 DIAGNOSIS — I10 Essential (primary) hypertension: Secondary | ICD-10-CM

## 2018-04-02 MED ORDER — ERGOCALCIFEROL 1.25 MG (50000 UT) PO CAPS: 50000.0000 [IU] | ORAL_CAPSULE | ORAL | 0 refills | Status: DC

## 2018-04-08 ENCOUNTER — Encounter: Payer: Self-pay | Admitting: Nurse Practitioner

## 2018-04-08 NOTE — Progress Notes (Signed)
Office: [phone]  /  Fax: [phone]   HPI:   Chief Complaint: OBESITY Madison Palmer is here to discuss her progress with her obesity treatment plan. She is on the Category 3 plan and is following her eating plan approximately 85 % of the time. She states she is exercising 0 minutes 0 times per week. Madison Palmer has been making a few devisations from the plan. She had spaghetti once and indulged in sweet potato pie for Thanksgiving. She is more sensitive to sweets. She has plans for brunch for her birthday.  Her weight is (!) 333 lb (151 kg) today and has had a weight loss of 15 pounds over a period of 2 to 3 weeks since her last visit. She has lost 24 lbs since starting treatment with [place].  Hypertension Madison Palmer is a 52 y.o. female with hypertension. Madison Palmer states blood pressure at home ranges between 126-148/81-97. Her blood pressure is elevated today but she denies chest pain, chest pressure, or headaches. She is working weight loss to help control her blood pressure with the goal of decreasing her risk of heart attack and stroke. Madison Palmer's blood pressure is not currently controlled.  Pre-Diabetes Madison Palmer has a diagnosis of pre-diabetes based on her elevated Hgb A1c and was informed this puts her at greater risk of developing diabetes. She denies carbohydrate cravings or GI side effects of metformin. She continues to work on diet and exercise to decrease risk of diabetes. She denies hypoglycemia.  Vitamin D Deficiency Madison Palmer has a diagnosis of vitamin D deficiency. She is currently taking prescription Vit D. She notes fatigue and denies nausea, vomiting or muscle weakness.  At risk for osteopenia and osteoporosis Madison Palmer is at higher risk of osteopenia and osteoporosis due to vitamin D deficiency.   ALLERGIES: Allergies  Allergen Reactions  . Fentanyl Hives  . Levofloxacin Hives  . Midazolam Hives  . Pollen Extract     seasonal seasonal seasonal  . Atorvastatin     Muscle pain in  legs   . Rosuvastatin     Abdominal pain    MEDICATIONS: Current Outpatient Medications on File Prior to Visit  Medication Sig Dispense Refill  . Acetaminophen (TYLENOL EXTRA STRENGTH PO) Take by mouth. Takes 2 tablets 2-3 times a day.    Madison Palmer Kitchen amLODipine (NORVASC) 2.5 MG tablet Take 1 tablet (2.5 mg total) by mouth daily. 90 tablet 23  . atenolol (TENORMIN) 25 MG tablet TK 1 T PO D  0  . cetirizine (ZYRTEC ALLERGY) 10 MG tablet Take 1 tablet (10 mg total) by mouth daily. (Patient taking differently: Take 10 mg by mouth as needed. ) 30 tablet 5  . esomeprazole (NEXIUM) 40 MG capsule Take 1 capsule (40 mg total) by mouth 2 (two) times daily. 180 capsule 0  . fluticasone (FLONASE) 50 MCG/ACT nasal spray Place 2 sprays into both nostrils daily.    . hydrALAZINE (APRESOLINE) 50 MG tablet Take 1tab in morning and afternoon, and 2tabs at bedtime 120 tablet 6  . hydrochlorothiazide (HYDRODIURIL) 50 MG tablet Take 50 mg by mouth daily.    Madison Palmer Kitchen ibuprofen (ADVIL,MOTRIN) 600 MG tablet Take 600 mg by mouth every 6 (six) hours as needed.    Madison Palmer Kitchen levothyroxine (SYNTHROID, LEVOTHROID) 75 MCG tablet TK 1 T PO QD  MONDAY-FRIDAY  0  . metFORMIN (GLUCOPHAGE) 500 MG tablet Take 1 tablet (500 mg total) by mouth daily with breakfast. 30 tablet 0  . omega-3 fish oil (MAXEPA) 1000 MG CAPS capsule Take 2 capsules (  2,000 mg total) by mouth 2 (two) times daily. 90 each   . polyethylene glycol-electrolytes (NULYTELY/GOLYTELY) 420 g solution Take 4,000 mLs by mouth as directed. 4000 mL 0   No current facility-administered medications on file prior to visit.     PAST MEDICAL HISTORY: Past Medical History:  Diagnosis Date  . Anemia   . Anxiety   . Arrhythmia    tachycardia  . Arthritis   . Chickenpox   . Depression   . Diverticulitis   . GERD (gastroesophageal reflux disease)   . Glaucoma   . Hyperlipidemia   . Hyperparathyroidism ([place])   . Hypertension   . Inflammatory polyps of colon ([place])   . LVH (left  ventricular hypertrophy)   . Lymphedema   . PCOS (polycystic ovarian syndrome)   . Prediabetes   . Recurrent UTI   . Sleep apnea    CPAP  . Thyroid disease   . Vitamin D deficiency     PAST SURGICAL HISTORY: Past Surgical History:  Procedure Laterality Date  . BREAST BIOPSY  2015  . CESAREAN SECTION  2004  . INNER EAR SURGERY     ear and sinus surgery  . LAPAROSCOPIC REPAIR AND REMOVAL OF GASTRIC BAND    . OOPHORECTOMY Left   . TONSILLECTOMY AND ADENOIDECTOMY      SOCIAL HISTORY: Social History   Tobacco Use  . Smoking status: Never Smoker  . Smokeless tobacco: Never Used  Substance Use Topics  . Alcohol use: Yes    Comment: social  . Drug use: No    FAMILY HISTORY: Family History  Adopted: Yes  Problem Relation Age of Onset  . Hearing loss Son        right   . Healthy Son     ROS: Review of Systems  Constitutional: Positive for malaise/fatigue and weight loss.  Cardiovascular: Negative for chest pain.       Negative chest pressure  Gastrointestinal: Negative for nausea and vomiting.  Musculoskeletal:       Negative muscle weakness  Neurological: Negative for headaches.  Endo/Heme/Allergies:       Negative hypoglycemia    PHYSICAL EXAM: Blood pressure (!) 168/104, pulse 89, temperature 98.4 F (36.9 C), temperature source Oral, height 5\' 2"  (1.575 m), weight (!) 333 lb (151 kg), SpO2 90 %. Body mass index is 60.91 kg/m. Physical Exam  Constitutional: She is oriented to person, place, and time. She appears well-developed and well-nourished.  Cardiovascular: Normal rate.  Pulmonary/Chest: Effort normal.  Musculoskeletal: Normal range of motion.  Neurological: She is oriented to person, place, and time.  Skin: Skin is warm and dry.  Psychiatric: She has a normal mood and affect. Her behavior is normal.  Vitals reviewed.   RECENT LABS AND TESTS: BMET    Component Value Date/Time   NA 142 02/27/2018 1233   K 3.9 02/27/2018 1233   CL 94 (L)  02/27/2018 1233   CO2 29 02/27/2018 1233   GLUCOSE 100 (H) 02/27/2018 1233   GLUCOSE 115 (H) 02/20/2018 1536   BUN 15 02/27/2018 1233   CREATININE 1.02 (H) 02/27/2018 1233   CREATININE 0.94 12/21/2017 1640   CALCIUM 10.3 03/25/2018 1209   GFRNONAA 64 02/27/2018 1233   GFRAA 74 02/27/2018 1233   Lab Results  Component Value Date   HGBA1C 6.0 (H) 02/27/2018   HGBA1C 5.7 09/10/2017   HGBA1C 5.9 05/29/2017   Lab Results  Component Value Date   INSULIN 9.6 02/27/2018   CBC  Component Value Date/Time   WBC 7.0 02/20/2018 1536   RBC 5.46 (H) 02/20/2018 1536   HGB 15.1 (H) 02/20/2018 1536   HCT 46.5 (H) 02/20/2018 1536   PLT 280.0 02/20/2018 1536   MCV 85.1 02/20/2018 1536   MCHC 32.4 02/20/2018 1536   RDW 15.7 (H) 02/20/2018 1536   LYMPHSABS 1.0 02/20/2018 1536   MONOABS 0.4 02/20/2018 1536   EOSABS 0.0 02/20/2018 1536   BASOSABS 0.1 02/20/2018 1536   Iron/TIBC/Ferritin/ %Sat    Component Value Date/Time   IRON 44 02/20/2018 1536   IRON 81 05/29/2017   TIBC 347 05/29/2017   IRONPCTSAT 8.8 (L) 02/20/2018 1536   Lipid Panel     Component Value Date/Time   CHOL 196 02/27/2018 1233   TRIG 142 02/27/2018 1233   HDL 46 02/27/2018 1233   CHOLHDL 5 09/10/2017 1156   VLDL 26.6 09/10/2017 1156   LDLCALC 122 (H) 02/27/2018 1233   Hepatic Function Panel     Component Value Date/Time   PROT 6.7 03/21/2018 1040   PROT 6.9 02/27/2018 1233   ALBUMIN 4.3 02/27/2018 1233   AST 22 02/27/2018 1233   ALT 27 02/27/2018 1233   ALKPHOS 100 02/27/2018 1233   BILITOT 0.9 02/27/2018 1233      Component Value Date/Time   TSH 1.450 02/27/2018 1233   TSH 0.97 12/11/2017 1158   TSH 1.28 05/29/2017   Results for YEHUDIS, MONCEAUX (MRN [id number]) as of 04/08/2018 11:50  Ref. Range 03/25/2018 12:09  VITD Latest Ref Range: 30.00 - 100.00 ng/mL 35.74   ASSESSMENT AND PLAN: Essential hypertension  Prediabetes  Vitamin D deficiency - Plan: ergocalciferol (VITAMIN D2) 1.25 MG  (50000 UT) capsule  At risk for osteoporosis  Class 3 severe obesity with serious comorbidity and body mass index (BMI) of 60.0 to 69.9 in adult, unspecified obesity type ([place])  PLAN:  Hypertension We discussed sodium restriction, working on healthy weight loss, and a regular exercise program as the means to achieve improved blood pressure control. Malillany agreed with this plan and agreed to follow up as directed. We will continue to monitor her blood pressure as well as her progress with the above lifestyle modifications. Madison Palmer agrees to continue her medications and will watch for signs of hypotension as she continues her lifestyle modifications. She is to bring cuff to next appointment and will calibrate against other blood pressure medications. Tamie agrees to follow up with our clinic in 2 weeks.  Pre-Diabetes Madison Palmer will continue to work on weight loss, exercise, and decreasing simple carbohydrates in her diet to help decrease the risk of diabetes. We dicussed metformin including benefits and risks. She was informed that eating too many simple carbohydrates or too many calories at one sitting increases the likelihood of GI side effects. Madison Palmer agrees to continue taking metformin, and she agrees to follow up with our clinic in 2 weeks as directed to monitor her progress.  Vitamin D Deficiency Madison Palmer was informed that low vitamin D levels contributes to fatigue and are associated with obesity, breast, and colon cancer. Madison Palmer agrees to continue taking prescription Vit D @50 ,000 IU 3 times weekly #12 and we will refill for 1 month. She will follow up for routine testing of vitamin D, at least 2-3 times per year. She was informed of the risk of over-replacement of vitamin D and agrees to not increase her dose unless she discusses this with [place] first. Madison Palmer agrees to follow up with our clinic in 2 weeks.  At  risk for osteopenia and osteoporosis Madison Palmer was given extended (15 minutes) osteoporosis  prevention counseling today. Madison Palmer is at risk for osteopenia and osteoporsis due to her vitamin D deficiency. She was encouraged to take her vitamin D and follow her higher calcium diet and increase strengthening exercise to help strengthen her bones and decrease her risk of osteopenia and osteoporosis.  Obesity Madison Palmer is currently in the action stage of change. As such, her goal is to continue with weight loss efforts She has agreed to follow the Category 3 plan Madison Palmer has been instructed to work up to a goal of 150 minutes of combined cardio and strengthening exercise per week for weight loss and overall health benefits. We discussed the following Behavioral Modification Strategies today: increasing lean protein intake, work on meal planning and easy cooking plans, dealing with family or coworker sabotage, travel eating strategies, and celebration eating strategies   Madison Palmer has agreed to follow up with our clinic in 2 weeks. She was informed of the importance of frequent follow up visits to maximize her success with intensive lifestyle modifications for her multiple health conditions.   OBESITY BEHAVIORAL INTERVENTION VISIT  Today's visit was # 3   Starting weight: 357 lbs Starting date: 02/27/18 Today's weight : 333 lbs Today's date: 04/02/2018 Total lbs lost to date: 24    ASK: We discussed the diagnosis of obesity with Madison Palmer today and Madison Palmer agreed to give [place] permission to discuss obesity behavioral modification therapy today.  ASSESS: Jenetta has the diagnosis of obesity and her BMI today is 60.89 Ruthell is in the action stage of change   ADVISE: Jentri was educated on the multiple health risks of obesity as well as the benefit of weight loss to improve her health. She was advised of the need for long term treatment and the importance of lifestyle modifications to improve her current health and to decrease her risk of future health problems.  AGREE: Multiple  dietary modification options and treatment options were discussed and  Cyanne agreed to follow the recommendations documented in the above note.  ARRANGE: Arihanna was educated on the importance of frequent visits to treat obesity as outlined per CMS and USPSTF guidelines and agreed to schedule her next follow up appointment today.  I, Madison Palmer, am acting as transcriptionist for Ilene Qua, MD  I have reviewed the above documentation for accuracy and completeness, and I agree with the above. - Ilene Qua, MD

## 2018-04-09 ENCOUNTER — Other Ambulatory Visit: Payer: Self-pay | Admitting: Nurse Practitioner

## 2018-04-09 DIAGNOSIS — N644 Mastodynia: Secondary | ICD-10-CM

## 2018-04-09 NOTE — Telephone Encounter (Signed)
Please help, pt is checking on [place] on her left breast that was place in 03/2018.

## 2018-04-10 ENCOUNTER — Other Ambulatory Visit: Payer: Self-pay | Admitting: Gastroenterology

## 2018-04-11 ENCOUNTER — Encounter: Payer: Self-pay | Admitting: Nurse Practitioner

## 2018-04-11 ENCOUNTER — Ambulatory Visit: Payer: BLUE CROSS/BLUE SHIELD | Admitting: Nurse Practitioner

## 2018-04-11 VITALS — BP 162/96 | HR 89 | Temp 98.6°F | Ht 62.0 in | Wt 346.6 lb

## 2018-04-11 DIAGNOSIS — H66003 Acute suppurative otitis media without spontaneous rupture of ear drum, bilateral: Secondary | ICD-10-CM | POA: Diagnosis not present

## 2018-04-11 DIAGNOSIS — J069 Acute upper respiratory infection, unspecified: Secondary | ICD-10-CM | POA: Diagnosis not present

## 2018-04-11 LAB — POCT INFLUENZA A/B
Influenza A, POC: NEGATIVE
Influenza B, POC: NEGATIVE

## 2018-04-11 MED ORDER — PREDNISONE 10 MG (21) PO TBPK: ORAL_TABLET | ORAL | 0 refills | Status: DC

## 2018-04-11 MED ORDER — GUAIFENESIN-DM 100-10 MG/5ML PO SYRP: 10.0000 mL | ORAL_SOLUTION | Freq: Three times a day (TID) | ORAL | 0 refills | Status: DC | PRN

## 2018-04-11 MED ORDER — CEFIXIME 400 MG PO CAPS: 400.0000 mg | ORAL_CAPSULE | Freq: Every day | ORAL | 0 refills | Status: DC

## 2018-04-11 MED ORDER — BENZONATATE 100 MG PO CAPS: 100.0000 mg | ORAL_CAPSULE | Freq: Three times a day (TID) | ORAL | 0 refills | Status: DC | PRN

## 2018-04-11 NOTE — Progress Notes (Signed)
Subjective:  Patient ID: Madison Palmer, female    DOB: 11/03/1965  Age: 52 y.o. MRN: [id number]  CC: Cough (sore throat, right ear pain,couging yellow mucus,painful to cough/4 days/ tylenol. son is sick at home. )  Sinus Problem  This is a new problem. The current episode started in the past 7 days. The problem is unchanged. Associated symptoms include chills, congestion, coughing, ear pain, headaches, a hoarse voice, sinus pressure, sneezing, a sore throat and swollen glands. Pertinent negatives include no shortness of breath. Past treatments include nothing.  Medrol dose pack prescribed by ortho yesterday.  Reviewed past Medical, Social and Family history today.  Outpatient Medications Prior to Visit  Medication Sig Dispense Refill  . Acetaminophen (TYLENOL EXTRA STRENGTH PO) Take by mouth. Takes 2 tablets 2-3 times a day.    Marland Kitchen amLODipine (NORVASC) 2.5 MG tablet Take 1 tablet (2.5 mg total) by mouth daily. 90 tablet 23  . atenolol (TENORMIN) 25 MG tablet TK 1 T PO D  0  . cetirizine (ZYRTEC ALLERGY) 10 MG tablet Take 1 tablet (10 mg total) by mouth daily. (Patient taking differently: Take 10 mg by mouth as needed. ) 30 tablet 5  . ergocalciferol (VITAMIN D2) 1.25 MG (50000 UT) capsule Take 1 capsule (50,000 Units total) by mouth 3 (three) times a week. 12 capsule 0  . esomeprazole (NEXIUM) 40 MG capsule TAKE ONE CAPSULE BY MOUTH TWICE DAILY 180 capsule 0  . fluticasone (FLONASE) 50 MCG/ACT nasal spray Place 2 sprays into both nostrils daily.    . hydrALAZINE (APRESOLINE) 50 MG tablet Take 1tab in morning and afternoon, and 2tabs at bedtime 120 tablet 6  . hydrochlorothiazide (HYDRODIURIL) 50 MG tablet Take 50 mg by mouth daily.    Marland Kitchen ibuprofen (ADVIL,MOTRIN) 600 MG tablet Take 600 mg by mouth every 6 (six) hours as needed.    Marland Kitchen levothyroxine (SYNTHROID, LEVOTHROID) 75 MCG tablet TK 1 T PO QD  MONDAY-FRIDAY  0  . metFORMIN (GLUCOPHAGE) 500 MG tablet Take 1 tablet (500 mg total) by mouth  daily with breakfast. 30 tablet 0  . omega-3 fish oil (MAXEPA) 1000 MG CAPS capsule Take 2 capsules (2,000 mg total) by mouth 2 (two) times daily. 90 each   . polyethylene glycol-electrolytes (NULYTELY/GOLYTELY) 420 g solution Take 4,000 mLs by mouth as directed. 4000 mL 0   No facility-administered medications prior to visit.     ROS See HPI  Objective:  BP (!) 162/96   Pulse 89   Temp 98.6 F (37 C) (Oral)   Ht 5\' 2"  (1.575 m)   Wt (!) 346 lb 9.6 oz (157.2 kg)   SpO2 93%   BMI 63.39 kg/m   BP Readings from Last 3 Encounters:  04/11/18 (!) 162/96  04/02/18 (!) 168/104  03/21/18 (!) 179/100    Wt Readings from Last 3 Encounters:  04/11/18 (!) 346 lb 9.6 oz (157.2 kg)  04/02/18 (!) 333 lb (151 kg)  03/21/18 (!) 342 lb (155.1 kg)    Physical Exam Vitals signs reviewed.  HENT:     Head:     Jaw: No trismus.     Right Ear: Ear canal and external ear normal. Decreased hearing noted. A middle ear effusion is present. No mastoid tenderness. Tympanic membrane is injected.     Left Ear: Ear canal and external ear normal. A middle ear effusion is present. No mastoid tenderness. Tympanic membrane is injected and bulging.     Nose: Mucosal edema and rhinorrhea present.  Right Sinus: Maxillary sinus tenderness present. No frontal sinus tenderness.     Left Sinus: Maxillary sinus tenderness present. No frontal sinus tenderness.     Mouth/Throat:     Pharynx: Uvula midline. Posterior oropharyngeal erythema present. No oropharyngeal exudate.  Eyes:     General: No scleral icterus. Neck:     Musculoskeletal: Normal range of motion and neck supple.  Cardiovascular:     Rate and Rhythm: Normal rate.     Heart sounds: Normal heart sounds.  Pulmonary:     Effort: Pulmonary effort is normal.     Breath sounds: Normal breath sounds.  Lymphadenopathy:     Cervical: Cervical adenopathy present.  Neurological:     Mental Status: She is alert and oriented to person, place, and time.      Lab Results  Component Value Date   WBC 7.0 02/20/2018   HGB 15.1 (H) 02/20/2018   HCT 46.5 (H) 02/20/2018   PLT 280.0 02/20/2018   GLUCOSE 100 (H) 02/27/2018   CHOL 196 02/27/2018   TRIG 142 02/27/2018   HDL 46 02/27/2018   LDLCALC 122 (H) 02/27/2018   ALT 27 02/27/2018   AST 22 02/27/2018   NA 142 02/27/2018   K 3.9 02/27/2018   CL 94 (L) 02/27/2018   CREATININE 1.02 (H) 02/27/2018   BUN 15 02/27/2018   CO2 29 02/27/2018   TSH 1.450 02/27/2018   HGBA1C 6.0 (H) 02/27/2018    US Abdomen Complete  Result Date: 03/20/2018 CLINICAL DATA:  Postprandial abdominal pain EXAM: ABDOMEN ULTRASOUND COMPLETE COMPARISON:  None. FINDINGS: Gallbladder: The gallbladder is adequately distended. There are multiple gallstones with the largest measuring 2.1 cm in diameter. There is no gallbladder wall thickening, pericholecystic fluid, or positive sonographic Murphy's sign Common bile duct: Diameter: 4.2 mm Liver: No focal lesion identified. Within normal limits in parenchymal echogenicity. Portal vein is patent on color Doppler imaging with normal direction of blood flow towards the liver. IVC: No abnormality visualized. Pancreas: The pancreatic body is normal in appearance. The pancreatic head and tail are obscured by bowel gas. Spleen: Size and appearance within normal limits. Right Kidney: Length: 11.4 cm. Echogenicity within normal limits. No mass or hydronephrosis visualized. Left Kidney: Length: 11.0 cm. Echogenicity within normal limits. No mass or hydronephrosis visualized. Abdominal aorta: No aneurysm visualized. Other findings: There is no ascites. IMPRESSION: Multiple gallstones. No sonographic evidence of acute cholecystitis. No acute intra-abdominal abnormality is observed otherwise. Limited visualization of the pancreas. Electronically Signed   By: David  Martinique M.D.   On: 03/20/2018 15:46    Assessment & Plan:   Lawrie was seen today for cough.  Diagnoses and all orders for this  visit:  Acute URI -     POCT Influenza A/B -     Discontinue: predniSONE (STERAPRED UNI-PAK 21 TAB) 10 MG (21) TBPK tablet; As directed on package -     cefixime (SUPRAX) 400 MG CAPS capsule; Take 1 capsule (400 mg total) by mouth daily. -     benzonatate (TESSALON) 100 MG capsule; Take 1 capsule (100 mg total) by mouth 3 (three) times daily as needed for cough. -     guaiFENesin-dextromethorphan (ROBITUSSIN DM) 100-10 MG/5ML syrup; Take 10 mLs by mouth every 8 (eight) hours as needed for cough.  Non-recurrent acute suppurative otitis media of both ears without spontaneous rupture of tympanic membranes -     Discontinue: predniSONE (STERAPRED UNI-PAK 21 TAB) 10 MG (21) TBPK tablet; As directed on package -  cefixime (SUPRAX) 400 MG CAPS capsule; Take 1 capsule (400 mg total) by mouth daily. -     benzonatate (TESSALON) 100 MG capsule; Take 1 capsule (100 mg total) by mouth 3 (three) times daily as needed for cough. -     guaiFENesin-dextromethorphan (ROBITUSSIN DM) 100-10 MG/5ML syrup; Take 10 mLs by mouth every 8 (eight) hours as needed for cough.   I have discontinued Marcela L. Palermo's predniSONE. I am also having her start on cefixime, benzonatate, and guaiFENesin-dextromethorphan. Additionally, I am having her maintain her atenolol, ibuprofen, fluticasone, Acetaminophen (TYLENOL EXTRA STRENGTH PO), omega-3 fish oil, hydrALAZINE, levothyroxine, hydrochlorothiazide, amLODipine, cetirizine, metFORMIN, polyethylene glycol-electrolytes, ergocalciferol, and esomeprazole.  Meds ordered this encounter  Medications  . DISCONTD: predniSONE (STERAPRED UNI-PAK 21 TAB) 10 MG (21) TBPK tablet    Sig: As directed on package    Dispense:  21 tablet    Refill:  0    Order Specific Question:   Supervising Provider    Answer:   Lucille Passy [[id number]]  . cefixime (SUPRAX) 400 MG CAPS capsule    Sig: Take 1 capsule (400 mg total) by mouth daily.    Dispense:  10 capsule    Refill:  0    Order Specific  Question:   Supervising Provider    Answer:   Lucille Passy [[id number]]  . benzonatate (TESSALON) 100 MG capsule    Sig: Take 1 capsule (100 mg total) by mouth 3 (three) times daily as needed for cough.    Dispense:  20 capsule    Refill:  0    Order Specific Question:   Supervising Provider    Answer:   Lucille Passy [[id number]]  . guaiFENesin-dextromethorphan (ROBITUSSIN DM) 100-10 MG/5ML syrup    Sig: Take 10 mLs by mouth every 8 (eight) hours as needed for cough.    Dispense:  118 mL    Refill:  0    Order Specific Question:   Supervising Provider    Answer:   Lucille Passy [[id number]]    Problem List Items Addressed This Visit    None    Visit Diagnoses    Acute URI    -  Primary   Relevant Medications   cefixime (SUPRAX) 400 MG CAPS capsule   benzonatate (TESSALON) 100 MG capsule   guaiFENesin-dextromethorphan (ROBITUSSIN DM) 100-10 MG/5ML syrup   Other Relevant Orders   POCT Influenza A/B (Completed)   Non-recurrent acute suppurative otitis media of both ears without spontaneous rupture of tympanic membranes       Relevant Medications   cefixime (SUPRAX) 400 MG CAPS capsule   benzonatate (TESSALON) 100 MG capsule   guaiFENesin-dextromethorphan (ROBITUSSIN DM) 100-10 MG/5ML syrup       Follow-up: Return if symptoms worsen or fail to improve.  Wilfred Lacy, NP

## 2018-04-11 NOTE — Patient Instructions (Signed)
Negative for influenza.  Maintain adequate oral hydration.  Start oral prednisone and cefixime (with food)  May alternate between benzonatate and robitussin for cough

## 2018-04-12 ENCOUNTER — Encounter: Payer: Self-pay | Admitting: Nurse Practitioner

## 2018-04-15 ENCOUNTER — Encounter: Payer: Self-pay | Admitting: Nurse Practitioner

## 2018-04-15 ENCOUNTER — Ambulatory Visit: Payer: BLUE CROSS/BLUE SHIELD | Attending: Nurse Practitioner | Admitting: Occupational Therapy

## 2018-04-15 DIAGNOSIS — I890 Lymphedema, not elsewhere classified: Secondary | ICD-10-CM | POA: Diagnosis present

## 2018-04-15 NOTE — Therapy (Signed)
[place] MAIN [facility] SERVICES [address] [place], [place], [address] Phone: [phone]   Fax:  [phone]  Occupational Therapy Treatment  Patient Details  Name: Madison Palmer MRN: [id number] Date of Birth: [birth year] Referring Provider (OT): 2004   Encounter Date: 04/15/2018  OT End of Session - 04/15/18 1657    Visit Number  4    Number of Visits  4    Date for OT Re-Evaluation  05/29/18    OT Start Time  1150    OT Stop Time  1235    OT Time Calculation (min)  45 min    Activity Tolerance  Patient tolerated treatment well;No increased pain;Patient limited by pain;Treatment limited secondary to medical complications (Comment)    Behavior During Therapy  [facility] for tasks assessed/performed       Past Medical History:  Diagnosis Date  . Anemia   . Anxiety   . Arrhythmia    tachycardia  . Arthritis   . Chickenpox   . Depression   . Diverticulitis   . GERD (gastroesophageal reflux disease)   . Glaucoma   . Hyperlipidemia   . Hyperparathyroidism ([place])   . Hypertension   . Inflammatory polyps of colon ([place])   . LVH (left ventricular hypertrophy)   . Lymphedema   . PCOS (polycystic ovarian syndrome)   . Prediabetes   . Recurrent UTI   . Sleep apnea    CPAP  . Thyroid disease   . Vitamin D deficiency     Past Surgical History:  Procedure Laterality Date  . BREAST BIOPSY  2015  . CESAREAN SECTION  2004  . INNER EAR SURGERY     ear and sinus surgery  . LAPAROSCOPIC REPAIR AND REMOVAL OF GASTRIC BAND    . OOPHORECTOMY Left   . TONSILLECTOMY AND ADENOIDECTOMY      There were no vitals filed for this visit.  Subjective Assessment - 04/15/18 1146    Subjective   Madison Palmer returns to OT today for OT visit 2/4 for custom compression garment fitting.Pt is accompanied by her son. Pt reports that she received garments more than 10 days ago. She feels they may be a little big at the top edge because they tend to slide  down.. Pt also reports she has lost 9 pounds since last visit as she started attending a weight management program recently.  (Pended)     Pertinent History  morbid obesity, OA, OSA, BLE lymphedema, Hypothytoidism, HTN, Cardiac test results pending  (Pended)     Limitations  difficulty walking, impaired functional mobility and transfers, requires assistive devices to perform personal care and most basic ADLs, impaired instrumental ADLs, limited abiility to work and perform productive activities, decreased social participation and leisure pursuits, impaired body image  (Pended)     Repetition  Increases Symptoms  (Pended)     Pain Onset  --  (Pended)    2004                  OT Treatments/Exercises (OP) - 04/15/18 0001      Manual Therapy   Manual Therapy  Edema management;Compression Bandaging    Edema Management  Completed anatomical measurements for replacement HOS devices    Compression Bandaging  mAX a TO DON ble CUSTOM DAYTIME COMPRESSION GARMENTS             OT Education - 04/15/18 1655    Education Details  Continued skilled Pt/caregiver education  And  LE ADL training throughout visit for lymphedema self care/ home program, including compression wrapping, compression garment and device wear/care, lymphatic pumping ther ex, simple self-MLD, and skin care. Discussed progress towards goals.     Person(s) Educated  Patient    Methods  Explanation;Demonstration    Comprehension  Verbalized understanding;Returned demonstration          OT Long Term Goals - 03/04/18 1649      OT LONG TERM GOAL #1   Title  LE SELF CARE: Pt will ahieve modified independence with donning and doffing custom, knee length compression stockings using assistive devices and extra time at  garment fitting after skilled LE self-care education.    Baseline  Max A    Time  1    Period  Days    Status  New    Target Date  --   3rd OT treatment visit\           Plan - 04/15/18 1658     Clinical Impression Statement  cOMPLETED ANATOMICAL MEASUREMENTS FOR bLE, custom, knee length , [employer] HOS devices designed to limit fibrosis formation over time. Initial    HOS device replacements, Jobst RELAX, slide down and bunch at ankles          causing discomfort and tourniquet effect. OT discussing solution with manufacturer's tech support team. Provided Max assist to don BLE compression garments. Pt is no longer able to reach feet to don   garments. Fit HOS devices when they are delivered and DC OT if Pt is stable.     Occupational Profile and client history currently impacting functional performance  morbid obesity and arthritis pain also severly  limit mobility and ambulation    Occupational performance deficits (Please refer to evaluation for details):  IADL's;Leisure;Social Participation;Work;ADL's;Other;Education;Rest and Sleep   functional ambnulation and mobility, body image, role performance   Rehab Potential  Fair    Current Impairments/barriers affecting progress:  unknown cardiac status- test results and new cardiology referral pending    OT Frequency  Other (comment)   3-6 visits to perform anatomical measurements for custom gcompresssion garment replacements and return visits for fitting and adjustments PRN   OT Treatment/Interventions  Self-care/ADL training;DME and/or AE instruction;Other (comment);Patient/family education;Therapeutic activities    Clinical Decision Making  Multiple treatment options, significant modification of task necessary    Recommended Other Services  Replace existing custom , knee length,  flat knit, ccl 2 ( 25-35 mmHg) Jobst Elvarex compression stockings with larger stockings to accomodate weight gain.    Consulted and Agree with Plan of Care  Patient       Patient will benefit from skilled therapeutic intervention in order to improve the following deficits and impairments:  Abnormal gait, Decreased balance, Decreased mobility, Decreased  skin integrity, Difficulty walking, Obesity, Decreased range of motion, Increased edema, Pain, Other (comment), Decreased knowledge of use of DME, Impaired flexibility, Decreased knowledge of precautions  Visit Diagnosis: Lymphedema, not elsewhere classified    Problem List Patient Active Problem List   Diagnosis Date Noted  . DDD (degenerative disc disease), lumbar 03/21/2018  . Primary osteoarthritis of both knees 03/21/2018  . Dysphagia 03/19/2018  . PVC (premature ventricular contraction) 03/12/2018  . LVH (left ventricular hypertrophy) 03/12/2018  . Generalized edema 03/12/2018  . Bilateral carotid artery stenosis 03/12/2018  . Weight gain 01/25/2018  . Hemorrhoids 01/05/2018  . Generalized postprandial abdominal pain 01/05/2018  . Hiatal hernia 01/05/2018  . Esophageal dysmotilities 01/05/2018  . Mixed hyperlipidemia  09/12/2017  . Multinodular goiter 07/27/2017  . Hyperparathyroidism ([place]) 06/13/2017  . Vitamin D deficiency 06/13/2017  . Hx of adenomatous colonic polyps 05/15/2017  . Eustachian tube dysfunction, right 05/15/2017  . Lymphatic edema 05/15/2017  . Hypertension 05/14/2017  . Anemia 05/11/2017  . Arrhythmia 05/11/2017  . Diverticulosis 05/11/2017  . GERD (gastroesophageal reflux disease) 05/11/2017  . Hypothyroidism 05/11/2017  . Lower extremity edema 05/11/2017  . Morbid obesity ([place]) 05/11/2017  . Polycystic ovary syndrome 05/11/2017  . OSA on CPAP 05/11/2017  . Neck pain 05/11/2017  . Allergic rhinitis 05/11/2017  . 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

## 2018-04-17 ENCOUNTER — Encounter: Payer: Self-pay | Admitting: Nurse Practitioner

## 2018-04-18 ENCOUNTER — Encounter (INDEPENDENT_AMBULATORY_CARE_PROVIDER_SITE_OTHER): Payer: Self-pay | Admitting: Family Medicine

## 2018-04-18 ENCOUNTER — Ambulatory Visit (INDEPENDENT_AMBULATORY_CARE_PROVIDER_SITE_OTHER): Payer: BLUE CROSS/BLUE SHIELD | Admitting: Family Medicine

## 2018-04-18 VITALS — BP 189/95 | HR 83 | Temp 98.0°F | Ht 62.0 in | Wt 341.0 lb

## 2018-04-18 DIAGNOSIS — I10 Essential (primary) hypertension: Secondary | ICD-10-CM | POA: Diagnosis not present

## 2018-04-18 DIAGNOSIS — R0902 Hypoxemia: Secondary | ICD-10-CM | POA: Diagnosis not present

## 2018-04-18 DIAGNOSIS — Z6841 Body Mass Index (BMI) 40.0 and over, adult: Secondary | ICD-10-CM

## 2018-04-18 DIAGNOSIS — R7303 Prediabetes: Secondary | ICD-10-CM | POA: Diagnosis not present

## 2018-04-18 NOTE — Progress Notes (Signed)
Office: [phone]  /  Fax: [phone]   HPI:   Chief Complaint: OBESITY Madison Palmer is here to discuss her progress with her obesity treatment plan. She is on the Category 3 plan and is following her eating plan approximately 70 % of the time. She states she is exercising 0 minutes 0 times per week. Madison Palmer saw a Scientist, product/process development at [facility] for parathyroid evaluation, and she was found to be a surgical candidate for parathyroid removal on February 27th. She has skipped a few meals secondary to eating. She has noticed increase in hunger if she isn't following the plan.  Her weight is (!) 341 lb (154.7 kg) today and has gained 8 pounds since her last visit. She has lost 16 lbs since starting treatment with [place].  Hypertension with LVH Madison Palmer is a 52 y.o. female with hypertension. Madison Palmer is on amlodipine, atenolol, hydralazine, and hydrochlorothiazide. She had not taken her medications today. She is seeing Cardiology and denies chest pain, chest pressure, or headaches. She is working weight loss to help control her blood pressure with the goal of decreasing her risk of heart attack and stroke. Madison Palmer's blood pressure is not currently controlled.  Pre-Diabetes Madison Palmer has a diagnosis of pre-diabetes based on her elevated Hgb A1c and was informed this puts her at greater risk of developing diabetes. She denies GI side effect of metformin and continues to work on diet and exercise to decrease risk of diabetes. She denies hypoglycemia.  Hypoxia-Intermittent Madison Palmer denies a history of congestive heart failure. She denies dyspnea on exertion, orthopnea, or leg swelling or edema. No rales on exam.  ASSESSMENT AND PLAN:  Essential hypertension - with LVH  Prediabetes  Hypoxia - Intermittent  Class 3 severe obesity with serious comorbidity and body mass index (BMI) of 60.0 to 69.9 in adult, unspecified obesity type ([place])  PLAN:  Hypertension with LVH We discussed sodium restriction,  working on healthy weight loss, and a regular exercise program as the means to achieve improved blood pressure control. Joy agreed with this plan and agreed to follow up as directed. We will continue to monitor her blood pressure as well as her progress with the above lifestyle modifications. Zareya agrees to continue her current medications and will watch for signs of hypotension as she continues her lifestyle modifications. Madison Palmer agrees to follow up with our clinic in 2 weeks.  Pre-Diabetes Madison Palmer will continue to work on weight loss, exercise, and decreasing simple carbohydrates in her diet to help decrease the risk of diabetes. We dicussed metformin including benefits and risks. She was informed that eating too many simple carbohydrates or too many calories at one sitting increases the likelihood of GI side effects. Avaline agrees to continue taking metformin, and she agrees to follow up with our clinic in 2 weeks as directed to monitor her progress.  Hypoxia-Intermittent Madison Palmer is to follow up with her primary care physician if symptoms develop. Madison Palmer agrees to follow up with our clinic in 2 weeks.  I spent > than 50% of the 25 minute visit on counseling as documented in the note.  Obesity Madison Palmer is currently in the action stage of change. As such, her goal is to continue with weight loss efforts She has agreed to follow the Category 3 plan Madison Palmer has been instructed to work up to a goal of 150 minutes of combined cardio and strengthening exercise per week for weight loss and overall health benefits. We discussed the following Behavioral Modification Strategies today: increasing lean  protein intake, work on meal planning and easy cooking plans, holiday eating strategies, better snacking choices, and planning for success    Madison Palmer has agreed to follow up with our clinic in 2 weeks. She was informed of the importance of frequent follow up visits to maximize her success with intensive  lifestyle modifications for her multiple health conditions.  ALLERGIES: Allergies  Allergen Reactions  . Fentanyl Hives  . Levofloxacin Hives  . Midazolam Hives  . Pollen Extract     seasonal seasonal seasonal  . Atorvastatin     Muscle pain in legs   . Rosuvastatin     Abdominal pain    MEDICATIONS: Current Outpatient Medications on File Prior to Visit  Medication Sig Dispense Refill  . Acetaminophen (TYLENOL EXTRA STRENGTH PO) Take by mouth. Takes 2 tablets 2-3 times a day.    Marland Kitchen amLODipine (NORVASC) 2.5 MG tablet Take 1 tablet (2.5 mg total) by mouth daily. 90 tablet 23  . atenolol (TENORMIN) 25 MG tablet TK 1 T PO D  0  . benzonatate (TESSALON) 100 MG capsule Take 1 capsule (100 mg total) by mouth 3 (three) times daily as needed for cough. 20 capsule 0  . cefixime (SUPRAX) 400 MG CAPS capsule Take 1 capsule (400 mg total) by mouth daily. 10 capsule 0  . cetirizine (ZYRTEC ALLERGY) 10 MG tablet Take 1 tablet (10 mg total) by mouth daily. (Patient taking differently: Take 10 mg by mouth as needed. ) 30 tablet 5  . ergocalciferol (VITAMIN D2) 1.25 MG (50000 UT) capsule Take 1 capsule (50,000 Units total) by mouth 3 (three) times a week. 12 capsule 0  . esomeprazole (NEXIUM) 40 MG capsule TAKE ONE CAPSULE BY MOUTH TWICE DAILY 180 capsule 0  . fluticasone (FLONASE) 50 MCG/ACT nasal spray Place 2 sprays into both nostrils daily.    Marland Kitchen guaiFENesin-dextromethorphan (ROBITUSSIN DM) 100-10 MG/5ML syrup Take 10 mLs by mouth every 8 (eight) hours as needed for cough. 118 mL 0  . hydrALAZINE (APRESOLINE) 50 MG tablet Take 1tab in morning and afternoon, and 2tabs at bedtime 120 tablet 6  . hydrochlorothiazide (HYDRODIURIL) 50 MG tablet Take 50 mg by mouth daily.    Marland Kitchen ibuprofen (ADVIL,MOTRIN) 600 MG tablet Take 600 mg by mouth every 6 (six) hours as needed.    Marland Kitchen levothyroxine (SYNTHROID, LEVOTHROID) 75 MCG tablet TK 1 T PO QD  MONDAY-FRIDAY  0  . omega-3 fish oil (MAXEPA) 1000 MG CAPS capsule  Take 2 capsules (2,000 mg total) by mouth 2 (two) times daily. 90 each   . polyethylene glycol-electrolytes (NULYTELY/GOLYTELY) 420 g solution Take 4,000 mLs by mouth as directed. 4000 mL 0  . metFORMIN (GLUCOPHAGE) 500 MG tablet Take 1 tablet (500 mg total) by mouth daily with breakfast. 30 tablet 0   No current facility-administered medications on file prior to visit.     PAST MEDICAL HISTORY: Past Medical History:  Diagnosis Date  . Anemia   . Anxiety   . Arrhythmia    tachycardia  . Arthritis   . Chickenpox   . Depression   . Diverticulitis   . GERD (gastroesophageal reflux disease)   . Glaucoma   . Hyperlipidemia   . Hyperparathyroidism ([place])   . Hypertension   . Inflammatory polyps of colon ([place])   . LVH (left ventricular hypertrophy)   . Lymphedema   . PCOS (polycystic ovarian syndrome)   . Prediabetes   . Recurrent UTI   . Sleep apnea    CPAP  .  Thyroid disease   . Vitamin D deficiency     PAST SURGICAL HISTORY: Past Surgical History:  Procedure Laterality Date  . BREAST BIOPSY  2015  . CESAREAN SECTION  2004  . INNER EAR SURGERY     ear and sinus surgery  . LAPAROSCOPIC REPAIR AND REMOVAL OF GASTRIC BAND    . OOPHORECTOMY Left   . TONSILLECTOMY AND ADENOIDECTOMY      SOCIAL HISTORY: Social History   Tobacco Use  . Smoking status: Never Smoker  . Smokeless tobacco: Never Used  Substance Use Topics  . Alcohol use: Yes    Comment: social  . Drug use: No    FAMILY HISTORY: Family History  Adopted: Yes  Problem Relation Age of Onset  . Hearing loss Son        right   . Healthy Son     ROS: Review of Systems  Constitutional: Negative for weight loss.  Respiratory: Negative for shortness of breath (with exertion).   Cardiovascular: Negative for chest pain, orthopnea and leg swelling.       Negative chest pressure  Neurological: Negative for headaches.  Endo/Heme/Allergies:       Negative hypoglycemia    PHYSICAL EXAM: Blood pressure  (!) 189/95, pulse 83, temperature 98 F (36.7 C), temperature source Oral, height 5\' 2"  (1.575 m), weight (!) 341 lb (154.7 kg), SpO2 90 %. Body mass index is 62.37 kg/m. Physical Exam Vitals signs reviewed.  Constitutional:      Appearance: Normal appearance. She is obese.  Cardiovascular:     Rate and Rhythm: Normal rate.     Pulses: Normal pulses.  Pulmonary:     Effort: Pulmonary effort is normal.  Musculoskeletal: Normal range of motion.  Skin:    General: Skin is warm and dry.  Neurological:     Mental Status: She is alert and oriented to person, place, and time.  Psychiatric:        Mood and Affect: Mood normal.        Behavior: Behavior normal.     RECENT LABS AND TESTS: BMET    Component Value Date/Time   NA 142 02/27/2018 1233   K 3.9 02/27/2018 1233   CL 94 (L) 02/27/2018 1233   CO2 29 02/27/2018 1233   GLUCOSE 100 (H) 02/27/2018 1233   GLUCOSE 115 (H) 02/20/2018 1536   BUN 15 02/27/2018 1233   CREATININE 1.02 (H) 02/27/2018 1233   CREATININE 0.94 12/21/2017 1640   CALCIUM 10.3 03/25/2018 1209   GFRNONAA 64 02/27/2018 1233   GFRAA 74 02/27/2018 1233   Lab Results  Component Value Date   HGBA1C 6.0 (H) 02/27/2018   HGBA1C 5.7 09/10/2017   HGBA1C 5.9 05/29/2017   Lab Results  Component Value Date   INSULIN 9.6 02/27/2018   CBC    Component Value Date/Time   WBC 7.0 02/20/2018 1536   RBC 5.46 (H) 02/20/2018 1536   HGB 15.1 (H) 02/20/2018 1536   HCT 46.5 (H) 02/20/2018 1536   PLT 280.0 02/20/2018 1536   MCV 85.1 02/20/2018 1536   MCHC 32.4 02/20/2018 1536   RDW 15.7 (H) 02/20/2018 1536   LYMPHSABS 1.0 02/20/2018 1536   MONOABS 0.4 02/20/2018 1536   EOSABS 0.0 02/20/2018 1536   BASOSABS 0.1 02/20/2018 1536   Iron/TIBC/Ferritin/ %Sat    Component Value Date/Time   IRON 44 02/20/2018 1536   IRON 81 05/29/2017   TIBC 347 05/29/2017   IRONPCTSAT 8.8 (L) 02/20/2018 1536   Lipid Panel  Component Value Date/Time   CHOL 196 02/27/2018  1233   TRIG 142 02/27/2018 1233   HDL 46 02/27/2018 1233   CHOLHDL 5 09/10/2017 1156   VLDL 26.6 09/10/2017 1156   LDLCALC 122 (H) 02/27/2018 1233   Hepatic Function Panel     Component Value Date/Time   PROT 6.7 03/21/2018 1040   PROT 6.9 02/27/2018 1233   ALBUMIN 4.3 02/27/2018 1233   AST 22 02/27/2018 1233   ALT 27 02/27/2018 1233   ALKPHOS 100 02/27/2018 1233   BILITOT 0.9 02/27/2018 1233      Component Value Date/Time   TSH 1.450 02/27/2018 1233   TSH 0.97 12/11/2017 1158   TSH 1.28 05/29/2017      OBESITY BEHAVIORAL INTERVENTION VISIT  Today's visit was # 4   Starting weight: 357 lbs Starting date: 02/27/18 Today's weight : 341 lbs  Today's date: 04/18/2018 Total lbs lost to date: 16    ASK: We discussed the diagnosis of obesity with Madison Palmer today and Madison Palmer agreed to give [place] permission to discuss obesity behavioral modification therapy today.  ASSESS: Maleia has the diagnosis of obesity and her BMI today is 62.35 Madison Palmer is in the action stage of change   ADVISE: Madison Palmer was educated on the multiple health risks of obesity as well as the benefit of weight loss to improve her health. She was advised of the need for long term treatment and the importance of lifestyle modifications to improve her current health and to decrease her risk of future health problems.  AGREE: Multiple dietary modification options and treatment options were discussed and  Madison Palmer agreed to follow the recommendations documented in the above note.  ARRANGE: Madison Palmer was educated on the importance of frequent visits to treat obesity as outlined per CMS and USPSTF guidelines and agreed to schedule her next follow up appointment today.  I, Trixie Dredge, am acting as transcriptionist for Ilene Qua, MD  I have reviewed the above documentation for accuracy and completeness, and I agree with the above. - Ilene Qua, MD

## 2018-04-19 DIAGNOSIS — I517 Cardiomegaly: Secondary | ICD-10-CM

## 2018-04-19 DIAGNOSIS — R0602 Shortness of breath: Secondary | ICD-10-CM

## 2018-04-23 ENCOUNTER — Ambulatory Visit
Admission: RE | Admit: 2018-04-23 | Discharge: 2018-04-23 | Disposition: A | Discharge status: Home / Self Care | Payer: BLUE CROSS/BLUE SHIELD | Source: Ambulatory Visit | Attending: Nurse Practitioner | Admitting: Nurse Practitioner

## 2018-04-23 DIAGNOSIS — N644 Mastodynia: Secondary | ICD-10-CM

## 2018-04-26 DIAGNOSIS — M0580 Other rheumatoid arthritis with rheumatoid factor of unspecified site: Secondary | ICD-10-CM | POA: Insufficient documentation

## 2018-04-26 NOTE — Progress Notes (Signed)
Office Visit Note  Patient: Madison Palmer             Date of Birth: 11/30/1965           MRN: [id number]             PCP: Flossie Buffy, NP Referring: Flossie Buffy, NP Visit Date: 05/07/2018 Occupation: @GUAROCC @  Subjective:  Pain in both knees.   History of Present Illness: MERRIAM BRANDNER is a 52 y.o. female history of low titer positive ANA and osteoarthritis in her knee joints.  She continues to have discomfort in her both knees.  She has had Visco supplement injections which were helpful in the past but not the last time.  None of the other joints are painful.  There is no history of joint swelling.  Activities of Daily Living:  Patient reports morning stiffness for several hours.   Patient Reports nocturnal pain.  Difficulty dressing/grooming: Reports Difficulty climbing stairs: Reports Difficulty getting out of chair: Reports Difficulty using hands for taps, buttons, cutlery, and/or writing: Denies  Review of Systems  Constitutional: Negative for fatigue.  HENT: Positive for mouth dryness. Negative for mouth sores, trouble swallowing and trouble swallowing.   Eyes: Positive for dryness. Negative for pain, redness and itching.  Respiratory: Negative for shortness of breath, wheezing and difficulty breathing.   Cardiovascular: Negative for chest pain, palpitations and swelling in legs/feet.  Gastrointestinal: Negative for abdominal pain, blood in stool, constipation and diarrhea.  Endocrine: Negative for increased urination.  Genitourinary: Negative for painful urination, nocturia and pelvic pain.  Musculoskeletal: Positive for arthralgias, joint pain and morning stiffness. Negative for joint swelling.  Skin: Negative for rash and hair loss.  Allergic/Immunologic: Negative for susceptible to infections.  Neurological: Positive for weakness. Negative for dizziness, light-headedness, headaches and memory loss.  Hematological: Negative for bruising/bleeding  tendency.  Psychiatric/Behavioral: Negative for confusion. The patient is not nervous/anxious.     PMFS History:  Patient Active Problem List   Diagnosis Date Noted  . 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 DDD (degenerative disc disease), lumbar 03/21/2018  . Primary osteoarthritis of both knees 03/21/2018  . Dysphagia 03/19/2018  . PVC (premature ventricular contraction) 03/12/2018  . LVH (left ventricular hypertrophy) 03/12/2018  . Generalized edema 03/12/2018  . Bilateral carotid artery stenosis 03/12/2018  . Weight gain 01/25/2018  . Hemorrhoids 01/05/2018  . Generalized postprandial abdominal pain 01/05/2018  . Hiatal hernia 01/05/2018  . Esophageal dysmotilities 01/05/2018  . Mixed hyperlipidemia 09/12/2017  . Multinodular goiter 07/27/2017  . Hyperparathyroidism ([place]) 06/13/2017  . Vitamin D deficiency 06/13/2017  . Hx of adenomatous colonic polyps 05/15/2017  . Eustachian tube dysfunction, right 05/15/2017  . Lymphatic edema 05/15/2017  . Hypertension 05/14/2017  . Anemia 05/11/2017  . Arrhythmia 05/11/2017  . Diverticulosis 05/11/2017  . GERD (gastroesophageal reflux disease) 05/11/2017  . Hypothyroidism 05/11/2017  . Lower extremity edema 05/11/2017  . Morbid obesity ([place]) 05/11/2017  . Polycystic ovary syndrome 05/11/2017  . OSA on CPAP 05/11/2017  . Neck pain 05/11/2017  . Allergic rhinitis 05/11/2017  . 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 Anemia   . Anxiety   . Arrhythmia    tachycardia  . Arthritis   . Chickenpox   . Depression   . Diverticulitis   . GERD (gastroesophageal reflux disease)   . Glaucoma   . Hyperlipidemia   . Hyperparathyroidism ([place])   .  Hypertension   . Inflammatory polyps of colon ([place])   . LVH (left ventricular hypertrophy)   . Lymphedema   . PCOS (polycystic ovarian syndrome)   . Prediabetes   . Recurrent UTI   . Sleep  apnea    CPAP  . Thyroid disease   . Vitamin D deficiency     Family History  Adopted: Yes  Problem Relation Age of Onset  . Hearing loss Son        right   . Healthy Son    Past Surgical History:  Procedure Laterality Date  . BREAST BIOPSY  2015  . CESAREAN SECTION  2004  . INNER EAR SURGERY     ear and sinus surgery  . LAPAROSCOPIC REPAIR AND REMOVAL OF GASTRIC BAND    . OOPHORECTOMY Left   . TONSILLECTOMY AND ADENOIDECTOMY     Social History   Social History Narrative   Lives with son and companion.      Objective: Vital Signs: BP (!) 184/105 (BP Location: Right Wrist, Patient Position: Sitting, Cuff Size: Normal)   Pulse 86   Resp 17   Ht 5\' 2"  (1.575 m)   Wt (!) 355 lb (161 kg)   BMI 64.93 kg/m    Physical Exam Vitals signs and nursing note reviewed.  Constitutional:      Appearance: She is well-developed.  HENT:     Head: Normocephalic and atraumatic.  Eyes:     Conjunctiva/sclera: Conjunctivae normal.  Neck:     Musculoskeletal: Normal range of motion.  Cardiovascular:     Rate and Rhythm: Normal rate and regular rhythm.     Heart sounds: Normal heart sounds.  Pulmonary:     Effort: Pulmonary effort is normal.     Breath sounds: Normal breath sounds.  Abdominal:     General: Bowel sounds are normal.     Palpations: Abdomen is soft.  Lymphadenopathy:     Cervical: No cervical adenopathy.  Skin:    General: Skin is warm and dry.     Capillary Refill: Capillary refill takes less than 2 seconds.  Neurological:     Mental Status: She is alert and oriented to person, place, and time.  Psychiatric:        Behavior: Behavior normal.      Musculoskeletal Exam: Spine thoracic lumbar spine good range of motion.  Shoulder joints elbow joints wrist joint MCPs PIPs DIPs been good range of motion with no synovitis.  Hip joints knee joints ankles MTPs PIPs with good range of motion with no synovitis.  She is crepitus in her bilateral knee joints with  discomfort.  CDAI Exam: CDAI Score: Not documented Patient Global Assessment: Not documented; Provider Global Assessment: Not documented Swollen: Not documented; Tender: Not documented Joint Exam   Not documented   There is currently no information documented on the homunculus. Go to the Rheumatology activity and complete the homunculus joint exam.  Investigation: No additional findings.  Imaging: US Breast Ltd Uni Left Inc Axilla  Result Date: 04/23/2018 CLINICAL DATA:  Patient presents with symptoms of left nipple itching and pain. No nipple skin discoloration or scaling. No discharge. EXAM: DIGITAL DIAGNOSTIC LEFT MAMMOGRAM WITH CAD AND TOMO ULTRASOUND LEFT BREAST COMPARISON:  Previous exam(s). ACR Breast Density Category b: There are scattered areas of fibroglandular density. FINDINGS: There are no discrete masses, areas of architectural distortion, areas of significant asymmetry or suspicious calcifications. No mammographic change. Mammographic images were processed with CAD. On physical exam, no mass  is palpated in the retroareolar left breast. There is no skin discoloration or scaling. Targeted ultrasound is performed, showing normal tissue in the retroareolar left breast. No masses. No duct ectasia. IMPRESSION: Negative exam.  No evidence of breast malignancy. RECOMMENDATION: Screening mammogram in July 2020.(Code:SM-B-01Y) I have discussed the findings and recommendations with the patient. Results were also provided in writing at the conclusion of the visit. If applicable, a reminder letter will be sent to the patient regarding the next appointment. BI-RADS CATEGORY  1: Negative. Electronically Signed   By: [name] M.D.   On: 04/23/2018 11:49   Mm Diag Breast Tomo Uni Left  Result Date: 04/23/2018 CLINICAL DATA:  Patient presents with symptoms of left nipple itching and pain. No nipple skin discoloration or scaling. No discharge. EXAM: DIGITAL DIAGNOSTIC LEFT MAMMOGRAM WITH CAD  AND TOMO ULTRASOUND LEFT BREAST COMPARISON:  Previous exam(s). ACR Breast Density Category b: There are scattered areas of fibroglandular density. FINDINGS: There are no discrete masses, areas of architectural distortion, areas of significant asymmetry or suspicious calcifications. No mammographic change. Mammographic images were processed with CAD. On physical exam, no mass is palpated in the retroareolar left breast. There is no skin discoloration or scaling. Targeted ultrasound is performed, showing normal tissue in the retroareolar left breast. No masses. No duct ectasia. IMPRESSION: Negative exam.  No evidence of breast malignancy. RECOMMENDATION: Screening mammogram in July 2020.(Code:SM-B-01Y) I have discussed the findings and recommendations with the patient. Results were also provided in writing at the conclusion of the visit. If applicable, a reminder letter will be sent to the patient regarding the next appointment. BI-RADS CATEGORY  1: Negative. Electronically Signed   By: [name] M.D.   On: 04/23/2018 11:49    Recent Labs: Lab Results  Component Value Date   WBC 7.0 02/20/2018   HGB 15.1 (H) 02/20/2018   PLT 280.0 02/20/2018   NA 142 02/27/2018   K 3.9 02/27/2018   CL 94 (L) 02/27/2018   CO2 29 02/27/2018   GLUCOSE 100 (H) 02/27/2018   BUN 15 02/27/2018   CREATININE 1.02 (H) 02/27/2018   BILITOT 0.9 02/27/2018   ALKPHOS 100 02/27/2018   AST 22 02/27/2018   ALT 27 02/27/2018   PROT 6.7 03/21/2018   ALBUMIN 4.3 02/27/2018   CALCIUM 10.3 03/25/2018   GFRAA 74 02/27/2018  SPEP normal, Lupus anticoagulant negative, beta-2 negative, anticardiolipin negative, ENA negative, C3-C4 normal, RF 36+, anti-CCP negative, CK 85, vitamin D 35.74  Speciality Comments: No specialty comments available.  Procedures:  No procedures performed Allergies: Fentanyl; Levofloxacin; Midazolam; Pollen extract; Atorvastatin; and Rosuvastatin   Assessment / Plan:     Visit Diagnoses: Positive  ANA (antinuclear antibody) - 1: 40 nuclear homogeneous, ENA negative, C3-C4 negative.  The ANA titer is not significant.  Rest of the autoimmune work-up was negative.  She has no clinical features of autoimmune disease.  Rheumatoid factor positive - RF 36, anti-CCP negative.  She had no synovitis on examination.  Advised her to contact me in case she develops new symptoms.  Primary osteoarthritis of both knees - Patient has been receiving Visco supplement injections.  She gives history of intermittent swelling in her knee joints.  No synovitis was noted.  Morbid obesity (HCC)-she is going to the weight loss program.  Mixed hyperlipidemia  OSA on CPAP  Multinodular goiter  LVH (left ventricular hypertrophy)  Essential hypertension-her blood pressure was very high today.  Patient states she has whitecoat syndrome and she has been  upset today.  I have advised her to monitor blood pressure closely and follow-up with her PCP.  Gastroesophageal reflux disease, esophagitis presence not specified  Diverticulosis   Orders: No orders of the defined types were placed in this encounter.  No orders of the defined types were placed in this encounter.    Follow-Up Instructions: No follow-ups on file.   Bo Merino, MD  Note - This record has been created using Editor, commissioning.  Chart creation errors have been sought, but may not always  have been located. Such creation errors do not reflect on  the standard of medical care.

## 2018-04-30 NOTE — Telephone Encounter (Signed)
Myocardial Amyloid test ordered and send to scheduler to be schedule.

## 2018-05-02 ENCOUNTER — Telehealth: Payer: Self-pay

## 2018-05-02 NOTE — Telephone Encounter (Signed)
Left vm for Verdis Frederickson to call back. Need to find out more information.

## 2018-05-02 NOTE — Telephone Encounter (Signed)
Copied from [place]. Topic: General - Other >> Apr 30, 2018  3:36 PM Oneta Rack wrote: Osvaldo Human name: Verdis Frederickson  Relation to pt: Weldon  Call back number: [phone] fax # [phone]    Reason for call:  Checking on the status if medical necessity form for compression garments were received, please not. Forms faxed to [phone].

## 2018-05-03 NOTE — Telephone Encounter (Signed)
Form completed and faxed. 

## 2018-05-06 ENCOUNTER — Ambulatory Visit: Payer: BLUE CROSS/BLUE SHIELD | Attending: Nurse Practitioner | Admitting: Physical Therapy

## 2018-05-06 ENCOUNTER — Encounter: Payer: Self-pay | Admitting: Physical Therapy

## 2018-05-06 DIAGNOSIS — M545 Low back pain, unspecified: Secondary | ICD-10-CM

## 2018-05-06 DIAGNOSIS — R262 Difficulty in walking, not elsewhere classified: Secondary | ICD-10-CM | POA: Diagnosis present

## 2018-05-06 DIAGNOSIS — M25562 Pain in left knee: Secondary | ICD-10-CM | POA: Diagnosis present

## 2018-05-06 DIAGNOSIS — G8929 Other chronic pain: Secondary | ICD-10-CM

## 2018-05-06 DIAGNOSIS — I890 Lymphedema, not elsewhere classified: Secondary | ICD-10-CM | POA: Diagnosis present

## 2018-05-06 DIAGNOSIS — M542 Cervicalgia: Secondary | ICD-10-CM | POA: Diagnosis present

## 2018-05-06 DIAGNOSIS — M25561 Pain in right knee: Secondary | ICD-10-CM | POA: Insufficient documentation

## 2018-05-06 NOTE — Therapy (Signed)
[facility] [address] [place], [place], [address] Phone: [phone]   Fax:  [phone]  Physical Therapy Evaluation  Patient Details  Name: Madison Palmer MRN: [id number] Date of Birth: 09/17/1965 Referring Provider (PT): Mardelle Matte   Encounter Date: 05/06/2018  PT End of Session - 05/06/18 7026    Visit Number  1    Date for PT Re-Evaluation  07/05/18    PT Start Time  1400    PT Stop Time  1455    PT Time Calculation (min)  55 min    Activity Tolerance  Patient limited by pain    Behavior During Therapy  [facility] for tasks assessed/performed       Past Medical History:  Diagnosis Date  . Anemia   . Anxiety   . Arrhythmia    tachycardia  . Arthritis   . Chickenpox   . Depression   . Diverticulitis   . GERD (gastroesophageal reflux disease)   . Glaucoma   . Hyperlipidemia   . Hyperparathyroidism ([place])   . Hypertension   . Inflammatory polyps of colon ([place])   . LVH (left ventricular hypertrophy)   . Lymphedema   . PCOS (polycystic ovarian syndrome)   . Prediabetes   . Recurrent UTI   . Sleep apnea    CPAP  . Thyroid disease   . Vitamin D deficiency     Past Surgical History:  Procedure Laterality Date  . BREAST BIOPSY  2015  . CESAREAN SECTION  2004  . INNER EAR SURGERY     ear and sinus surgery  . LAPAROSCOPIC REPAIR AND REMOVAL OF GASTRIC BAND    . OOPHORECTOMY Left   . TONSILLECTOMY AND ADENOIDECTOMY      There were no vitals filed for this visit.   Subjective Assessment - 05/06/18 1405    Subjective  We saw patient in September and October.  She continued to have high rating of pain, we sent her back to MD.  She did not return after 02/14/18.  She reports that she has had a lot of different medical tests, she has been referred to weight loss and weight loss management.  She has been seeing various MD's and is still having tests.  She returns today with dx of LBP and OA of the knees.  She  reports that she has good days and bad days.  Had knee Gel injections the series of 3 injections.  She reports that she has not had much less pain.    Limitations  Lifting;Standing;Walking;House hold activities    Patient Stated Goals  have less pain    Currently in Pain?  Yes    Pain Score  8     Pain Location  Back   also pain in the knees   Pain Orientation  Upper;Lower    Pain Descriptors / Indicators  Aching;Tightness    Pain Type  Chronic pain    Pain Radiating Towards  c/o some numbness in the backs of her legs with sitting    Pain Onset  More than a month ago    Pain Frequency  Constant    Aggravating Factors   standing, walking, activity all increase pain to 10/10    Pain Relieving Factors  rest, pain meds, pain can be a 4/10    Effect of Pain on Daily Activities  reports that she has difficulty performing any ADL's, difficulty sitting > 30 minutes at computer, difficulty walking uses  a cane for household         [facility] PT Assessment - 05/06/18 0001      Assessment   Medical Diagnosis  LBP, neck pain and bilateral OA of the knees    Referring Provider (PT)  Mardelle Matte    Onset Date/Surgical Date  04/05/18    Hand Dominance  Right    Prior Therapy  PT her in September and October without much changes      Precautions   Precaution Comments  LE precautions      Restrictions   Other Position/Activity Restrictions  fall risk      Balance Screen   Has the patient fallen in the past 6 months  No    Has the patient had a decrease in activity level because of a fear of falling?   No    Is the patient reluctant to leave their home because of a fear of falling?   No      Home Environment   Additional Comments  does housework, ranch home      Prior Function   Level of Independence  Independent;Independent with basic ADLs;Independent with community mobility with device;Independent with household mobility with device;Independent with homemaking with ambulation;Independent with  transfers    Vocation  Unemployed    Vocation Requirements  Single mother raising 66 yo boy. Undergoing divorce. Moved to [place] from [place] in late 2018    Leisure  no exercise      Posture/Postural Control   Posture Comments  fwd head, obese, rounded shoulders      AROM   Overall AROM Comments  knee AROM is 0-90 degrees flexion with mild crepitus palpable, pain with flexion, Lumbar ROM is decreased 50% for extension, flexion WNL's      Strength   Overall Strength Comments  4-/5 for the LE's      Palpation   Palpation comment  patient with significant spasms, tightness and tenderness in the upper traps, the rhomboids and the lumbar area and into the buttocks, no increase of temp noted in the knees      Transfers   Comments  some transfers are unsafe as she has to plop at times and the chair will slide       Ambulation/Gait   Gait Comments  struggles to walk, uses a SPC,       Standardized Balance Assessment   Standardized Balance Assessment  Timed Up and Go Test      Timed Up and Go Test   Normal TUG (seconds)  20                Objective measurements completed on examination: See above findings.      [facility] Adult PT Treatment/Exercise - 05/06/18 0001      Modalities   Modalities  Electrical Stimulation;Moist Heat      Moist Heat Therapy   Number Minutes Moist Heat  15 Minutes    Moist Heat Location  Cervical;Lumbar Spine      Electrical Stimulation   Electrical Stimulation Location  C/T area    Electrical Stimulation Action  IFC    Electrical Stimulation Parameters  sitting    Electrical Stimulation Goals  Pain               PT Short Term Goals - 05/06/18 1441      PT SHORT TERM GOAL #1   Title  independent with initial HEP    Time  2    Period  Weeks    Status  Achieved        PT Long Term Goals - 05/06/18 1441      PT LONG TERM GOAL #1   Title  understand proper body mechanics for ADL's    Time  8    Period  Weeks    Status  New       PT LONG TERM GOAL #2   Title  increase lumbar ROM 25%    Period  Weeks    Status  New      PT LONG TERM GOAL #3   Title  decrease pain 50%    Time  8    Period  Weeks    Status  New      PT LONG TERM GOAL #4   Title  report able to do her normal housework without increase pain    Time  8    Period  Weeks    Status  New      PT LONG TERM GOAL #5   Title  patietn will increase knee AROM to 100 degrees flexion    Time  8    Period  Weeks    Status  New             Plan - 05/06/18 1437    Clinical Impression Statement  Patient was seen here for 10 visits in September and October for back and knee pain.  She did not have much change in pain so we sent her back to the MD.  She reports that she has had a barrage of tests and that she has had treatment for her lymphedema.  She returns with the back, neck and knee pain, made worse by sitting, standing or walking, she has significant spasms in the upper traps.  the right thigh has increased lymphedema compared to the left.  She uses her weight for momentum to get up but at times she is unsafe with the chairs moving on her.     History and Personal Factors relevant to plan of care:  Obesity, lymphedema    Clinical Presentation  Stable    Clinical Decision Making  Low    Rehab Potential  Good    PT Frequency  2x / week    PT Duration  8 weeks    PT Treatment/Interventions  ADLs/Self Care Home Management;Cryotherapy;Electrical Stimulation;Moist Heat;Functional mobility training;Therapeutic activities;Therapeutic exercise;Balance training;Neuromuscular re-education;Manual techniques;Patient/family education;Dry needling    PT Next Visit Plan  start exercises as tolerated    Consulted and Agree with Plan of Care  Patient       Patient will benefit from skilled therapeutic intervention in order to improve the following deficits and impairments:  Abnormal gait, Decreased range of motion, Difficulty walking, Increased muscle spasms,  Pain, Impaired flexibility, Improper body mechanics, Decreased strength, Decreased mobility, Obesity  Visit Diagnosis: Acute bilateral low back pain without sciatica - Plan: PT plan of care cert/re-cert  Cervicalgia - Plan: PT plan of care cert/re-cert  Chronic pain of left knee - Plan: PT plan of care cert/re-cert  Chronic pain of right knee - Plan: PT plan of care cert/re-cert  Difficulty in walking, not elsewhere classified - Plan: PT plan of care cert/re-cert     Problem List Patient Active Problem List   Diagnosis Date Noted  . 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 DDD (degenerative disc disease), lumbar 03/21/2018  . Primary osteoarthritis of both knees 03/21/2018  . Dysphagia 03/19/2018  .  PVC (premature ventricular contraction) 03/12/2018  . LVH (left ventricular hypertrophy) 03/12/2018  . Generalized edema 03/12/2018  . Bilateral carotid artery stenosis 03/12/2018  . Weight gain 01/25/2018  . Hemorrhoids 01/05/2018  . Generalized postprandial abdominal pain 01/05/2018  . Hiatal hernia 01/05/2018  . Esophageal dysmotilities 01/05/2018  . Mixed hyperlipidemia 09/12/2017  . Multinodular goiter 07/27/2017  . Hyperparathyroidism ([place]) 06/13/2017  . Vitamin D deficiency 06/13/2017  . Hx of adenomatous colonic polyps 05/15/2017  . Eustachian tube dysfunction, right 05/15/2017  . Lymphatic edema 05/15/2017  . Hypertension 05/14/2017  . Anemia 05/11/2017  . Arrhythmia 05/11/2017  . Diverticulosis 05/11/2017  . GERD (gastroesophageal reflux disease) 05/11/2017  . Hypothyroidism 05/11/2017  . Lower extremity edema 05/11/2017  . Morbid obesity ([place]) 05/11/2017  . Polycystic ovary syndrome 05/11/2017  . OSA on CPAP 05/11/2017  . Neck pain 05/11/2017  . Allergic rhinitis 05/11/2017  . 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

## 2018-05-07 ENCOUNTER — Ambulatory Visit: Payer: BLUE CROSS/BLUE SHIELD | Admitting: Rheumatology

## 2018-05-07 ENCOUNTER — Encounter: Payer: Self-pay | Admitting: Rheumatology

## 2018-05-07 VITALS — BP 184/105 | HR 86 | Resp 17 | Ht 62.0 in | Wt 355.0 lb

## 2018-05-07 DIAGNOSIS — E782 Mixed hyperlipidemia: Secondary | ICD-10-CM | POA: Diagnosis not present

## 2018-05-07 DIAGNOSIS — I10 Essential (primary) hypertension: Secondary | ICD-10-CM

## 2018-05-07 DIAGNOSIS — R768 Other specified abnormal immunological findings in serum: Secondary | ICD-10-CM | POA: Diagnosis not present

## 2018-05-07 DIAGNOSIS — G4733 Obstructive sleep apnea (adult) (pediatric): Secondary | ICD-10-CM

## 2018-05-07 DIAGNOSIS — I517 Cardiomegaly: Secondary | ICD-10-CM

## 2018-05-07 DIAGNOSIS — K5790 Diverticulosis of intestine, part unspecified, without perforation or abscess without bleeding: Secondary | ICD-10-CM

## 2018-05-07 DIAGNOSIS — Z9989 Dependence on other enabling machines and devices: Secondary | ICD-10-CM

## 2018-05-07 DIAGNOSIS — M170 Bilateral primary osteoarthritis of knee: Secondary | ICD-10-CM | POA: Diagnosis not present

## 2018-05-07 DIAGNOSIS — E042 Nontoxic multinodular goiter: Secondary | ICD-10-CM

## 2018-05-07 DIAGNOSIS — K219 Gastro-esophageal reflux disease without esophagitis: Secondary | ICD-10-CM

## 2018-05-08 ENCOUNTER — Ambulatory Visit (HOSPITAL_COMMUNITY): Payer: BLUE CROSS/BLUE SHIELD | Admitting: Certified Registered"

## 2018-05-08 ENCOUNTER — Encounter: Payer: Self-pay | Admitting: Nurse Practitioner

## 2018-05-08 ENCOUNTER — Telehealth: Payer: Self-pay | Admitting: *Deleted

## 2018-05-08 ENCOUNTER — Ambulatory Visit (HOSPITAL_COMMUNITY)
Admission: RE | Admit: 2018-05-08 | Discharge: 2018-05-08 | Disposition: A | Discharge status: Home / Self Care | Payer: BLUE CROSS/BLUE SHIELD | Attending: Gastroenterology | Admitting: Gastroenterology

## 2018-05-08 ENCOUNTER — Encounter (HOSPITAL_COMMUNITY)
Admission: RE | Disposition: A | Discharge status: Home / Self Care | Payer: Self-pay | Source: Home / Self Care | Attending: Gastroenterology

## 2018-05-08 ENCOUNTER — Encounter (HOSPITAL_COMMUNITY): Payer: Self-pay | Admitting: *Deleted

## 2018-05-08 DIAGNOSIS — Z5309 Procedure and treatment not carried out because of other contraindication: Secondary | ICD-10-CM | POA: Diagnosis present

## 2018-05-08 DIAGNOSIS — R1084 Generalized abdominal pain: Secondary | ICD-10-CM

## 2018-05-08 LAB — GLUCOSE, CAPILLARY: Glucose-Capillary: 97 mg/dL (ref 70–99)

## 2018-05-08 SURGERY — CANCELLED PROCEDURE

## 2018-05-08 MED ORDER — PROPOFOL 10 MG/ML IV BOLUS
INTRAVENOUS | Status: AC
  Filled 2018-05-08: qty 80

## 2018-05-08 MED ORDER — SODIUM CHLORIDE 0.9 % IV SOLN
INTRAVENOUS | Status: DC
  Administered 2018-05-08: 500 mL via INTRAVENOUS

## 2018-05-08 SURGICAL SUPPLY — 25 items

## 2018-05-08 NOTE — Telephone Encounter (Signed)
Hi Dr. Percival Spanish,    Today I was scheduled to have an endoscopy and colonoscopy. I prepared and went to the hospital as directed however the procedures were canceled out of concern about my oxygen and heart. My oxygen reading was 85-86 so they put me on 4 liters and while on 4 liters my oxygen was 99. After they canceled the procedure, they weaned me off the oxygen to determine if it was high enough to go home. At 2 liters, it was 98, at one liter it was 95, and when off, it was 92. The concern about the heart was the stress test as it was scheduled after the procedure had been scheduled. I wanted you to be aware. Also, when I returned home the right side of my face around the lower jaw was hurting and swollen. Not sure why. My BP in the hospital was 200/93. I know we're trying to find out what is wrong with me thus I want to keep you informed in case there are additional tests or visits required. Also, my legs have been swelling which I thought was lymphedema and I'm gaining weight.

## 2018-05-08 NOTE — Progress Notes (Signed)
Pt was cancelled per anesthesia regarding history. Pt was placed on 02 after when she got here her o2 sat was 86-89% on RA. Pt was weaned off the O2 to have a SAT staying at 92% on RA. Dr. Rush Landmark aware of RA oxygen saturation at 92%. Pt released to go home due to cancellation of procedure. Pt will follow up with Dr. Rush Landmark in his office.

## 2018-05-08 NOTE — Telephone Encounter (Signed)
No new suggestion.  Keep follow up.  Call Ms. Ouch with the results and send results to [name], Charlene Brooke, NP

## 2018-05-08 NOTE — Telephone Encounter (Signed)
Spoke with pt, she has never noticed her oxygen being that low. She reports no SOB or orthopnea. Her bp before leaving home was 120/88 and the elevated bp was after they had canceled her procedure. She has swelling in her feet and ankles that is pretty much there all the time, it may go down some but never goes away. She reports her weight is up 16 lbs in 3 weeks. Her nuclear test is 05-14-18 and a follow up with dr hochrein was schedule for 05-15-18. Will forward to dr hochrein for any recommendations prior to follow up appointment.

## 2018-05-08 NOTE — Anesthesia Preprocedure Evaluation (Signed)
Anesthesia Evaluation    Reviewed: Allergy & Precautions, NPO status , Patient's Chart, lab work & pertinent test results, reviewed documented beta blocker date and time   History of Anesthesia Complications Negative for: history of anesthetic complications  Airway        Dental   Pulmonary sleep apnea and Continuous Positive Airway Pressure Ventilation ,           Cardiovascular hypertension, Pt. on home beta blockers and Pt. on medications      Neuro/Psych negative neurological ROS  negative psych ROS   GI/Hepatic Neg liver ROS, GERD  ,  Endo/Other  Hypothyroidism Morbid obesity  Renal/GU negative Renal ROS  negative genitourinary   Musculoskeletal negative musculoskeletal ROS (+)   Abdominal   Peds  Hematology negative hematology ROS (+)   Anesthesia Other Findings 53 yo F for EGD - OSA on CPAP, HTN, GERD, hypothyroid, BMI 64  Reproductive/Obstetrics                             Anesthesia Physical Anesthesia Plan Anesthesia Quick Evaluation

## 2018-05-09 ENCOUNTER — Encounter: Payer: Self-pay | Admitting: Nurse Practitioner

## 2018-05-09 ENCOUNTER — Encounter (INDEPENDENT_AMBULATORY_CARE_PROVIDER_SITE_OTHER): Payer: Self-pay | Admitting: Family Medicine

## 2018-05-09 ENCOUNTER — Telehealth (HOSPITAL_COMMUNITY): Payer: Self-pay

## 2018-05-09 ENCOUNTER — Other Ambulatory Visit (INDEPENDENT_AMBULATORY_CARE_PROVIDER_SITE_OTHER): Payer: Self-pay | Admitting: Family Medicine

## 2018-05-09 ENCOUNTER — Telehealth: Payer: Self-pay

## 2018-05-09 ENCOUNTER — Ambulatory Visit (INDEPENDENT_AMBULATORY_CARE_PROVIDER_SITE_OTHER): Payer: BLUE CROSS/BLUE SHIELD | Admitting: Family Medicine

## 2018-05-09 VITALS — BP 176/102 | HR 93 | Temp 97.8°F | Ht 62.0 in | Wt 348.0 lb

## 2018-05-09 DIAGNOSIS — I10 Essential (primary) hypertension: Secondary | ICD-10-CM | POA: Diagnosis not present

## 2018-05-09 DIAGNOSIS — Z9189 Other specified personal risk factors, not elsewhere classified: Secondary | ICD-10-CM | POA: Diagnosis not present

## 2018-05-09 DIAGNOSIS — R0902 Hypoxemia: Secondary | ICD-10-CM | POA: Diagnosis not present

## 2018-05-09 DIAGNOSIS — E559 Vitamin D deficiency, unspecified: Secondary | ICD-10-CM

## 2018-05-09 DIAGNOSIS — Z6841 Body Mass Index (BMI) 40.0 and over, adult: Secondary | ICD-10-CM

## 2018-05-09 MED ORDER — VITAMIN D (ERGOCALCIFEROL) 1.25 MG (50000 UNIT) PO CAPS: 50000.0000 [IU] | ORAL_CAPSULE | ORAL | 0 refills | Status: DC

## 2018-05-09 NOTE — Progress Notes (Signed)
This is a patient that was ready for upper and lower endoscopy.  However in her evaluation prior to procedure she was found to be hypertensive into the 200/100 range.  She also has recently been seen by cardiology with a pending cardiac stress test to be done in the next week to 2 weeks. After discussion with the anesthesia team it is felt that she is not a candidate for procedures at this moment until completion of her cardiac work-up and optimization of her antihypertensive regimen. As such, even though she has prepped, her procedures are canceled for today. I spoke with the patient as did anesthesia to discuss our reasoning and thought process.  She is understandably frustrated but understands why we are holding on her procedures today. I will have her set up for follow-up in clinic versus follow-up via phone to ensure that her stress test is fine and then she can move forward with our procedures.  She has an upcoming surgery in February so I think a colonoscopy at the end of February or beginning of March would be reasonable. I will put in a note so that she can be recalled around March and ensure that things are safe for [place] to move forward with her procedures.  Justice Britain, MD [facility] Office # [phone]

## 2018-05-09 NOTE — Telephone Encounter (Signed)
Encounter complete. 

## 2018-05-09 NOTE — Telephone Encounter (Signed)
Recall in Epic for follow up in March

## 2018-05-09 NOTE — Telephone Encounter (Signed)
-----   Message from Irving Copas., MD sent at 05/09/2018 12:32 PM EST ----- Regarding: Update Madison Palmer,This patient had to be canceled the morning of her procedure because of her blood pressure as well as her pending cardiac work-up which was completely new since our last visit in clinic and when we arrange this.I would like to put in a recall reminder for March for this patient so that we have time to review that her cardiac stress test has normalized or is reasonable to move forward with the procedure and that no other medications need to be managed.  Her blood pressure was elevated and her oxygen status also was noted to be a bit lower than ideal.Let see the recall reminder in March and determine if she needs to be seen in clinic versus if she can be a direct to hospital procedures at that time.Thank you.GM

## 2018-05-10 ENCOUNTER — Ambulatory Visit: Payer: BLUE CROSS/BLUE SHIELD | Admitting: Nurse Practitioner

## 2018-05-10 ENCOUNTER — Inpatient Hospital Stay (HOSPITAL_BASED_OUTPATIENT_CLINIC_OR_DEPARTMENT_OTHER)
Admission: EM | Admit: 2018-05-10 | Discharge: 2018-05-13 | DRG: 291 | Disposition: A | Discharge status: Home Health | Payer: BLUE CROSS/BLUE SHIELD | Attending: Internal Medicine | Admitting: Internal Medicine

## 2018-05-10 ENCOUNTER — Emergency Department (HOSPITAL_BASED_OUTPATIENT_CLINIC_OR_DEPARTMENT_OTHER): Payer: BLUE CROSS/BLUE SHIELD

## 2018-05-10 ENCOUNTER — Other Ambulatory Visit: Payer: Self-pay

## 2018-05-10 ENCOUNTER — Other Ambulatory Visit: Payer: Self-pay | Admitting: Nurse Practitioner

## 2018-05-10 ENCOUNTER — Encounter (HOSPITAL_BASED_OUTPATIENT_CLINIC_OR_DEPARTMENT_OTHER): Payer: Self-pay | Admitting: *Deleted

## 2018-05-10 ENCOUNTER — Encounter: Payer: Self-pay | Admitting: Nurse Practitioner

## 2018-05-10 ENCOUNTER — Ambulatory Visit (INDEPENDENT_AMBULATORY_CARE_PROVIDER_SITE_OTHER): Payer: BLUE CROSS/BLUE SHIELD

## 2018-05-10 VITALS — BP 170/92 | HR 90 | Temp 98.5°F | Ht 62.0 in | Wt 348.0 lb

## 2018-05-10 DIAGNOSIS — I110 Hypertensive heart disease with heart failure: Principal | ICD-10-CM | POA: Diagnosis present

## 2018-05-10 DIAGNOSIS — I10 Essential (primary) hypertension: Secondary | ICD-10-CM

## 2018-05-10 DIAGNOSIS — Z888 Allergy status to other drugs, medicaments and biological substances status: Secondary | ICD-10-CM

## 2018-05-10 DIAGNOSIS — R768 Other specified abnormal immunological findings in serum: Secondary | ICD-10-CM | POA: Diagnosis present

## 2018-05-10 DIAGNOSIS — I890 Lymphedema, not elsewhere classified: Secondary | ICD-10-CM | POA: Diagnosis present

## 2018-05-10 DIAGNOSIS — I160 Hypertensive urgency: Secondary | ICD-10-CM | POA: Diagnosis present

## 2018-05-10 DIAGNOSIS — R0989 Other specified symptoms and signs involving the circulatory and respiratory systems: Secondary | ICD-10-CM | POA: Diagnosis not present

## 2018-05-10 DIAGNOSIS — I499 Cardiac arrhythmia, unspecified: Secondary | ICD-10-CM | POA: Diagnosis not present

## 2018-05-10 DIAGNOSIS — I272 Pulmonary hypertension, unspecified: Secondary | ICD-10-CM | POA: Diagnosis present

## 2018-05-10 DIAGNOSIS — I498 Other specified cardiac arrhythmias: Secondary | ICD-10-CM

## 2018-05-10 DIAGNOSIS — M1990 Unspecified osteoarthritis, unspecified site: Secondary | ICD-10-CM | POA: Diagnosis present

## 2018-05-10 DIAGNOSIS — Z90721 Acquired absence of ovaries, unilateral: Secondary | ICD-10-CM

## 2018-05-10 DIAGNOSIS — E039 Hypothyroidism, unspecified: Secondary | ICD-10-CM | POA: Diagnosis present

## 2018-05-10 DIAGNOSIS — E213 Hyperparathyroidism, unspecified: Secondary | ICD-10-CM | POA: Diagnosis present

## 2018-05-10 DIAGNOSIS — Z9884 Bariatric surgery status: Secondary | ICD-10-CM

## 2018-05-10 DIAGNOSIS — Z7984 Long term (current) use of oral hypoglycemic drugs: Secondary | ICD-10-CM

## 2018-05-10 DIAGNOSIS — R7303 Prediabetes: Secondary | ICD-10-CM | POA: Diagnosis present

## 2018-05-10 DIAGNOSIS — R0902 Hypoxemia: Secondary | ICD-10-CM

## 2018-05-10 DIAGNOSIS — Z87440 Personal history of urinary (tract) infections: Secondary | ICD-10-CM

## 2018-05-10 DIAGNOSIS — Z881 Allergy status to other antibiotic agents status: Secondary | ICD-10-CM

## 2018-05-10 DIAGNOSIS — E669 Obesity, unspecified: Secondary | ICD-10-CM

## 2018-05-10 DIAGNOSIS — Z885 Allergy status to narcotic agent status: Secondary | ICD-10-CM

## 2018-05-10 DIAGNOSIS — E876 Hypokalemia: Secondary | ICD-10-CM | POA: Diagnosis present

## 2018-05-10 DIAGNOSIS — I493 Ventricular premature depolarization: Secondary | ICD-10-CM | POA: Diagnosis present

## 2018-05-10 DIAGNOSIS — K219 Gastro-esophageal reflux disease without esophagitis: Secondary | ICD-10-CM | POA: Diagnosis present

## 2018-05-10 DIAGNOSIS — Z9989 Dependence on other enabling machines and devices: Secondary | ICD-10-CM

## 2018-05-10 DIAGNOSIS — E209 Hypoparathyroidism, unspecified: Secondary | ICD-10-CM | POA: Diagnosis not present

## 2018-05-10 DIAGNOSIS — Z86010 Personal history of colonic polyps: Secondary | ICD-10-CM

## 2018-05-10 DIAGNOSIS — R918 Other nonspecific abnormal finding of lung field: Secondary | ICD-10-CM | POA: Diagnosis present

## 2018-05-10 DIAGNOSIS — I509 Heart failure, unspecified: Secondary | ICD-10-CM

## 2018-05-10 DIAGNOSIS — H409 Unspecified glaucoma: Secondary | ICD-10-CM | POA: Diagnosis present

## 2018-05-10 DIAGNOSIS — Z79890 Hormone replacement therapy (postmenopausal): Secondary | ICD-10-CM

## 2018-05-10 DIAGNOSIS — E210 Primary hyperparathyroidism: Secondary | ICD-10-CM | POA: Diagnosis present

## 2018-05-10 DIAGNOSIS — J9611 Chronic respiratory failure with hypoxia: Secondary | ICD-10-CM | POA: Diagnosis present

## 2018-05-10 DIAGNOSIS — E559 Vitamin D deficiency, unspecified: Secondary | ICD-10-CM | POA: Diagnosis present

## 2018-05-10 DIAGNOSIS — J9621 Acute and chronic respiratory failure with hypoxia: Secondary | ICD-10-CM | POA: Diagnosis present

## 2018-05-10 DIAGNOSIS — I5033 Acute on chronic diastolic (congestive) heart failure: Secondary | ICD-10-CM | POA: Diagnosis present

## 2018-05-10 DIAGNOSIS — R0602 Shortness of breath: Secondary | ICD-10-CM | POA: Diagnosis not present

## 2018-05-10 DIAGNOSIS — Z6841 Body Mass Index (BMI) 40.0 and over, adult: Secondary | ICD-10-CM

## 2018-05-10 DIAGNOSIS — G4733 Obstructive sleep apnea (adult) (pediatric): Secondary | ICD-10-CM

## 2018-05-10 DIAGNOSIS — Z7951 Long term (current) use of inhaled steroids: Secondary | ICD-10-CM

## 2018-05-10 DIAGNOSIS — I878 Other specified disorders of veins: Secondary | ICD-10-CM | POA: Diagnosis present

## 2018-05-10 DIAGNOSIS — E785 Hyperlipidemia, unspecified: Secondary | ICD-10-CM | POA: Diagnosis present

## 2018-05-10 DIAGNOSIS — Z79899 Other long term (current) drug therapy: Secondary | ICD-10-CM

## 2018-05-10 HISTORY — DX: Chronic respiratory failure with hypoxia: J96.11

## 2018-05-10 LAB — CBC
HCT: 47.8 % — ABNORMAL HIGH (ref 36.0–46.0)
Hemoglobin: 14.4 g/dL (ref 12.0–15.0)
MCH: 25.6 pg — ABNORMAL LOW (ref 26.0–34.0)
MCHC: 30.1 g/dL (ref 30.0–36.0)
MCV: 84.9 fL (ref 80.0–100.0)
NRBC: 0 % (ref 0.0–0.2)
Platelets: 252 10*3/uL (ref 150–400)
RBC: 5.63 MIL/uL — ABNORMAL HIGH (ref 3.87–5.11)
RDW: 17.1 % — ABNORMAL HIGH (ref 11.5–15.5)
WBC: 7.6 10*3/uL (ref 4.0–10.5)

## 2018-05-10 LAB — BASIC METABOLIC PANEL
ANION GAP: 7 (ref 5–15)
BUN: 22 mg/dL — ABNORMAL HIGH (ref 6–20)
CO2: 35 mmol/L — ABNORMAL HIGH (ref 22–32)
CREATININE: 0.94 mg/dL (ref 0.44–1.00)
Calcium: 9.8 mg/dL (ref 8.9–10.3)
Chloride: 97 mmol/L — ABNORMAL LOW (ref 98–111)
GFR calc non Af Amer: >60 mL/min (ref >60)
Glucose, Bld: 117 mg/dL — ABNORMAL HIGH (ref 70–99)
Potassium: 3 mmol/L — ABNORMAL LOW (ref 3.5–5.1)
SODIUM: 139 mmol/L (ref 135–145)

## 2018-05-10 LAB — D-DIMER, QUANTITATIVE (NOT AT ARMC): D DIMER QUANT: 0.64 ug{FEU}/mL — AB (ref <0.50)

## 2018-05-10 LAB — BRAIN NATRIURETIC PEPTIDE: B Natriuretic Peptide: 199.8 pg/mL — ABNORMAL HIGH (ref 0.0–100.0)

## 2018-05-10 LAB — TROPONIN I: Troponin I: 0.04 ng/mL (ref <0.03)

## 2018-05-10 MED ORDER — AMLODIPINE BESYLATE 5 MG PO TABS: 5.0000 mg | ORAL_TABLET | Freq: Once | ORAL | Status: DC

## 2018-05-10 MED ORDER — ALBUTEROL SULFATE (2.5 MG/3ML) 0.083% IN NEBU: 2.5000 mg | INHALATION_SOLUTION | Freq: Four times a day (QID) | RESPIRATORY_TRACT | Status: DC | PRN

## 2018-05-10 MED ORDER — FUROSEMIDE 10 MG/ML IJ SOLN
40.0000 mg | Freq: Once | INTRAMUSCULAR | Status: AC
  Administered 2018-05-10: 40 mg via INTRAVENOUS
  Filled 2018-05-10: qty 4

## 2018-05-10 MED ORDER — NITROGLYCERIN 2 % TD OINT
1.0000 [in_us] | TOPICAL_OINTMENT | Freq: Four times a day (QID) | TRANSDERMAL | Status: DC
  Administered 2018-05-10 – 2018-05-13 (×10): 1 [in_us] via TOPICAL
  Filled 2018-05-10: qty 1
  Filled 2018-05-10: qty 30

## 2018-05-10 MED ORDER — IOPAMIDOL (ISOVUE-370) INJECTION 76%
100.0000 mL | Freq: Once | INTRAVENOUS | Status: AC | PRN
  Administered 2018-05-10: 100 mL via INTRAVENOUS

## 2018-05-10 MED ORDER — FUROSEMIDE 40 MG PO TABS: 40.0000 mg | ORAL_TABLET | Freq: Every day | ORAL | 0 refills | Status: DC

## 2018-05-10 MED ORDER — POTASSIUM CHLORIDE CRYS ER 20 MEQ PO TBCR: 20.0000 meq | EXTENDED_RELEASE_TABLET | Freq: Every day | ORAL | 0 refills | Status: DC

## 2018-05-10 NOTE — Progress Notes (Signed)
Patient's SPO2 is 85% on RA.  Placed patient on 2 liter nasal cannula.  Patient's SPO2 increased and remained at 96%.

## 2018-05-10 NOTE — Patient Instructions (Addendum)
Madison Palmer was notified of lab results. She was advised to go to the ED for further evaluation (CTA) to rule out PE. She was also informed about CXR results. Needs furosemide and potassium for pulmonary vascular congestion. Sent prescriptions. She verbalized understanding and agreed to go to ED to further evaluation.

## 2018-05-10 NOTE — ED Triage Notes (Signed)
She oxygen has been dropping for "a while". Her MD did blood work today and her Ddimer was elevated.

## 2018-05-10 NOTE — Telephone Encounter (Signed)
Spoke with pt, aware of dr hochrein's recommendations. ?

## 2018-05-10 NOTE — ED Provider Notes (Signed)
[facility] EMERGENCY DEPARTMENT Provider Note   CSN: [id number] Arrival date & time: 05/10/18  1933     History   Chief Complaint Shortness of breath  HPI Madison Palmer is a 53 y.o. female.  HPI Patient states she had an outpatient d-dimer test.  It was positive and she was told to come to the emergency room to get a CT scan.  Patient has had some trouble with mild shortness of breath.  She saw her primary care doctor in the office today.  She had a chest x-ray that showed some pulmonary vascular congestion.  After she left she was notified about the positive d-dimer test.  Patient does not have any history of pulmonary embolism.  Denies any chest pain.  She is not having any fevers or chills. Past Medical History:  Diagnosis Date  . Anemia   . Anxiety   . Arrhythmia    tachycardia  . Arthritis   . Chickenpox   . Depression   . Diverticulitis   . GERD (gastroesophageal reflux disease)   . Glaucoma   . Hyperlipidemia   . Hyperparathyroidism ([place])   . Hypertension   . Inflammatory polyps of colon ([place])   . LVH (left ventricular hypertrophy)   . Lymphedema   . PCOS (polycystic ovarian syndrome)   . Prediabetes   . Recurrent UTI   . Sleep apnea    CPAP  . Thyroid disease   . Vitamin D deficiency     Patient Active Problem List   Diagnosis Date Noted  . 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 DDD (degenerative disc disease), lumbar 03/21/2018  . Primary osteoarthritis of both knees 03/21/2018  . Dysphagia 03/19/2018  . PVC (premature ventricular contraction) 03/12/2018  . LVH (left ventricular hypertrophy) 03/12/2018  . Generalized edema 03/12/2018  . Bilateral carotid artery stenosis 03/12/2018  . Weight gain 01/25/2018  . Hemorrhoids 01/05/2018  . Generalized postprandial abdominal pain 01/05/2018  . Hiatal hernia 01/05/2018  . Esophageal dysmotilities 01/05/2018  . Mixed hyperlipidemia 09/12/2017  . Multinodular goiter  07/27/2017  . Hyperparathyroidism ([place]) 06/13/2017  . Vitamin D deficiency 06/13/2017  . Hx of adenomatous colonic polyps 05/15/2017  . Eustachian tube dysfunction, right 05/15/2017  . Lymphatic edema 05/15/2017  . Hypertension 05/14/2017  . Anemia 05/11/2017  . Arrhythmia 05/11/2017  . Diverticulosis 05/11/2017  . GERD (gastroesophageal reflux disease) 05/11/2017  . Hypothyroidism 05/11/2017  . Lower extremity edema 05/11/2017  . Morbid obesity ([place]) 05/11/2017  . Polycystic ovary syndrome 05/11/2017  . OSA on CPAP 05/11/2017  . Neck pain 05/11/2017  . Allergic rhinitis 05/11/2017  . 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 BREAST BIOPSY  2015  . CESAREAN SECTION  2004  . INNER EAR SURGERY     ear and sinus surgery  . LAPAROSCOPIC REPAIR AND REMOVAL OF GASTRIC BAND    . OOPHORECTOMY Left   . TONSILLECTOMY AND ADENOIDECTOMY       OB History    Gravida  1   Para  1   Term      Preterm      AB      Living  1     SAB      TAB      Ectopic      Multiple      Live Births  Home Medications    Prior to Admission medications   Medication Sig Start Date End Date Taking? Authorizing Provider  acetaminophen (TYLENOL) 500 MG tablet Take 1,000 mg by mouth 3 (three) times daily as needed for moderate pain or headache.    [provider]  amLODipine (NORVASC) 2.5 MG tablet Take 1 tablet (2.5 mg total) by mouth daily. 03/12/18   Minus Breeding, MD  atenolol (TENORMIN) 25 MG tablet Take 25 mg by mouth every evening.  02/16/17   [provider]  benzonatate (TESSALON) 100 MG capsule Take 1 capsule (100 mg total) by mouth 3 (three) times daily as needed for cough. 04/11/18   [name], Charlene Brooke, NP  cefixime (SUPRAX) 400 MG CAPS capsule Take 1 capsule (400 mg total) by mouth daily. 04/11/18   [name], Charlene Brooke, NP  cetirizine (ZYRTEC ALLERGY) 10 MG  tablet Take 1 tablet (10 mg total) by mouth daily. Patient taking differently: Take 10 mg by mouth daily as needed for allergies.  03/13/18   [name], Charlene Brooke, NP  ergocalciferol (VITAMIN D2) 1.25 MG (50000 UT) capsule Take 1 capsule (50,000 Units total) by mouth 3 (three) times a week. 04/03/18   Eber Jones, MD  esomeprazole (NEXIUM) 40 MG capsule TAKE ONE CAPSULE BY MOUTH TWICE DAILY 04/11/18   Mansouraty, Telford Nab., MD  fluticasone [facility]) 50 MCG/ACT nasal spray Place 2 sprays into both nostrils daily.    [provider]  furosemide (LASIX) 40 MG tablet Take 1 tablet (40 mg total) by mouth daily. 05/10/18   [name], Charlene Brooke, NP  guaiFENesin-dextromethorphan (ROBITUSSIN DM) 100-10 MG/5ML syrup Take 10 mLs by mouth every 8 (eight) hours as needed for cough. 04/11/18   [name], Charlene Brooke, NP  hydrALAZINE (APRESOLINE) 50 MG tablet Take 1tab in morning and afternoon, and 2tabs at bedtime 12/11/17   [name], Charlene Brooke, NP  hydrochlorothiazide (HYDRODIURIL) 50 MG tablet Take 50 mg by mouth daily.    [provider]  ibuprofen (ADVIL,MOTRIN) 200 MG tablet Take 400 mg by mouth daily as needed for headache or moderate pain.    [provider]  levothyroxine (SYNTHROID, LEVOTHROID) 75 MCG tablet TK 1 T PO QD  MONDAY-FRIDAY 01/25/18   Renato Shin, MD  metFORMIN (GLUCOPHAGE) 500 MG tablet Take 1 tablet (500 mg total) by mouth daily with breakfast. 03/14/18 05/07/18  Eber Jones, MD  omega-3 fish oil (MAXEPA) 1000 MG CAPS capsule Take 2 capsules (2,000 mg total) by mouth 2 (two) times daily. Patient not taking: Reported on 05/10/2018 09/12/17   [name], Charlene Brooke, NP  polyethylene glycol-electrolytes (NULYTELY/GOLYTELY) 420 g solution Take 4,000 mLs by mouth as directed. 03/25/18   Mansouraty, Telford Nab., MD  potassium chloride SA (K-DUR,KLOR-CON) 20 MEQ tablet Take 1 tablet (20 mEq total) by mouth daily. 05/10/18   [name], Charlene Brooke, NP  Vitamin D,  Ergocalciferol, (DRISDOL) 1.25 MG (50000 UT) CAPS capsule Take 1 capsule (50,000 Units total) by mouth every 7 (seven) days. 05/09/18   Eber Jones, MD    Family History Family History  Adopted: Yes  Problem Relation Age of Onset  . Hearing loss Son        right   . Healthy Son     Social History Social History   Tobacco Use  . Smoking status: Never Smoker  . Smokeless tobacco: Never Used  Substance Use Topics  . Alcohol use: Yes    Comment: social  . Drug use: No     Allergies  Fentanyl; Levofloxacin; Midazolam; Pollen extract; Atorvastatin; and Rosuvastatin   Review of Systems Review of Systems  All other systems reviewed and are negative.    Physical Exam Updated Vital Signs BP (!) 193/106   Pulse 93   Temp 98.6 F (37 C) (Oral)   Resp (!) 22   Ht 1.575 m (5\' 2" )   Wt (!) 157.8 kg   SpO2 96%   BMI 63.63 kg/m   Physical Exam Vitals signs and nursing note reviewed.  Constitutional:      General: She is not in acute distress.    Appearance: She is well-developed. She is obese.  HENT:     Head: Normocephalic and atraumatic.     Right Ear: External ear normal.     Left Ear: External ear normal.  Eyes:     General: No scleral icterus.       Right eye: No discharge.        Left eye: No discharge.     Conjunctiva/sclera: Conjunctivae normal.  Neck:     Musculoskeletal: Neck supple.     Trachea: No tracheal deviation.  Cardiovascular:     Rate and Rhythm: Normal rate and regular rhythm.  Pulmonary:     Effort: Pulmonary effort is normal. No respiratory distress.     Breath sounds: Normal breath sounds. No stridor. No wheezing or rales.  Abdominal:     General: Bowel sounds are normal. There is no distension.     Palpations: Abdomen is soft.     Tenderness: There is no abdominal tenderness. There is no guarding or rebound.  Musculoskeletal:        General: No tenderness.     Right lower leg: Edema present.     Left lower leg: Edema  present.  Skin:    General: Skin is warm and dry.     Findings: No rash.  Neurological:     Mental Status: She is alert.     Cranial Nerves: No cranial nerve deficit (no facial droop, extraocular movements intact, no slurred speech).     Sensory: No sensory deficit.     Motor: No abnormal muscle tone or seizure activity.     Coordination: Coordination normal.      ED Treatments / Results  Labs (all labs ordered are listed, but only abnormal results are displayed) Labs Reviewed  CBC - Abnormal; Notable for the following components:      Result Value   RBC 5.63 (*)    HCT 47.8 (*)    MCH 25.6 (*)    RDW 17.1 (*)    All other components within normal limits  BASIC METABOLIC PANEL - Abnormal; Notable for the following components:   Potassium 3.0 (*)    Chloride 97 (*)    CO2 35 (*)    Glucose, Bld 117 (*)    BUN 22 (*)    All other components within normal limits  BRAIN NATRIURETIC PEPTIDE - Abnormal; Notable for the following components:   B Natriuretic Peptide 199.8 (*)    All other components within normal limits  TROPONIN I - Abnormal; Notable for the following components:   Troponin I 0.04 (*)    All other components within normal limits    EKG EKG Interpretation  Date/Time:  Friday May 10 2018 20:46:50 EST Ventricular Rate:  83 PR Interval:    QRS Duration: 104 QT Interval:  407 QTC Calculation: 479 R Axis:   81 Text Interpretation:  Sinus rhythm Atrial premature complex  Right atrial enlargement Borderline T abnormalities, lateral leads Baseline wander in lead(s) V2 V6 No old tracing to compare Confirmed by Dorie Rank [phone]) on 05/10/2018 9:44:50 PM   Radiology Dg Chest 2 View  Result Date: 05/10/2018 CLINICAL DATA:  Arrhythmia. Hypoxia. EXAM: CHEST - 2 VIEW COMPARISON:  None. FINDINGS: There is cardiomegaly with pulmonary vascular congestion. No discrete effusions. No acute bone abnormality. IMPRESSION: Cardiomegaly with pulmonary vascular congestion  consistent with mild congestive heart failure. Electronically Signed   By: Lorriane Shire M.D.   On: 05/10/2018 15:42   Ct Angio Chest Pe W And/or Wo Contrast  Result Date: 05/10/2018 CLINICAL DATA:  Hypoxia room air. History of hypertension, polycystic ovarian syndrome left ventricular hypertrophy and GERD. History of breast biopsy in 2015 and left oophorectomy. EXAM: CT ANGIOGRAPHY CHEST WITH CONTRAST TECHNIQUE: Multidetector CT imaging of the chest was performed using the standard protocol during bolus administration of intravenous contrast. Multiplanar CT image reconstructions and MIPs were obtained to evaluate the vascular anatomy. CONTRAST:  170mL ISOVUE-370 IOPAMIDOL (ISOVUE-370) INJECTION 76% COMPARISON:  CXR 05/10/2018 FINDINGS: Cardiovascular: Cardiomegaly without pericardial effusion or thickening. Nonaneurysmal thoracic aorta without dissection. Aortic atherosclerosis at the arch. No large central pulmonary embolus to the segmental level. Satisfactory opacification of the pulmonary arteries is noted to the segmental level. Mediastinum/Nodes: Nonpathologic sized mediastinal lymph nodes, largest is lower paratracheal measuring up to 9 mm short axis. No hilar nor axillary lymphadenopathy. Lungs/Pleura: Too numerous to count bilateral pulmonary nodules, the largest measuring 15 mm in the right lower lobe with next largest in the right middle lobe measuring 9 mm. Largest in the left lung is medially within the lingula at 13 mm. Patchy mosaic attenuation of the lungs, nonspecific but may represent areas of interspersed air trapping, alveolitis/pneumonitis and/or hypoventilatory change. Upper Abdomen: No enhancing mass of the included liver. The adrenal glands are excluded on this study. Musculoskeletal: No chest wall abnormality. No acute or significant osseous findings. Osteophytes along the mid to lower thoracic spine with degenerative disc disease consistent thoracic spondylosis. Review of the MIP  images confirms the above findings. IMPRESSION: 1. No acute pulmonary embolus. 2. There are scattered too numerous to count bilateral pulmonary nodules, the largest is 15 mm in the right lower lobe and in the left lung, 13 mm within the lingula. Although findings could be postinfectious or inflammatory in etiology, the possibility of pulmonary metastasis is of concern. Non-contrast chest CT at 3-6 months is recommended. If the nodules are stable at time of repeat CT, then future CT at 18-24 months (from today's scan) is considered optional for low-risk patients, but is recommended for high-risk patients. This recommendation follows the consensus statement: Guidelines for Management of Incidental Pulmonary Nodules Detected on CT Images: From the Fleischner Society 2017; Radiology 2017; 284:228-243. Can also consider PET-CT or potentially percutaneous sampling of accessible nodules as clinically warranted. 3. Patchy mosaic attenuation of the lungs, nonspecific but may represent areas of interspersed air trapping, alveolitis/pneumonitis and/or hypoventilatory change. 4. Cardiomegaly. Aortic Atherosclerosis (ICD10-I70.0). Electronically Signed   By: Ashley Royalty M.D.   On: 05/10/2018 22:44    Procedures .Critical Care Performed by: Dorie Rank, MD Authorized by: Dorie Rank, MD   Critical care provider statement:    Critical care time (minutes):  35   Critical care was time spent personally by me on the following activities:  Discussions with consultants, evaluation of patient's response to treatment, examination of patient, ordering and performing treatments and interventions, ordering and review of laboratory studies,  ordering and review of radiographic studies, pulse oximetry, re-evaluation of patient's condition, obtaining history from patient or surrogate and review of old charts   (including critical care time)  Medications Ordered in ED Medications  amLODipine (NORVASC) tablet 5 mg (0 mg Oral Hold  05/10/18 2308)  nitroGLYCERIN (NITROGLYN) 2 % ointment 1 inch (1 inch Topical Given 05/10/18 2318)  iopamidol (ISOVUE-370) 76 % injection 100 mL (100 mLs Intravenous Contrast Given 05/10/18 2215)  furosemide (LASIX) injection 40 mg (40 mg Intravenous Given 05/10/18 2319)     Initial Impression / Assessment and Plan / ED Course  I have reviewed the triage vital signs and the nursing notes.  Pertinent labs & imaging results that were available during my care of the patient were reviewed by me and considered in my medical decision making (see chart for details).  Clinical Course as of May 11 2323  Fri May 10, 2018  2322 Patient's laboratory tests are notable for an elevated BNP and troponin.  Electrolyte panel is notable for an elevated CO2.  This suggests that she has a chronic respiratory acidosis.  Patient does appear morbidly obese.  I suspect she might have a component of pulmonary hypertension associated with obstructive sleep apnea.   [JK]  2323 CT scan reviewed.  No evidence of pulmonary embolism.   [ZO]  1096 When the patient walked to the bathroom without any oxygen her oxygen saturation dropped into the 70s.  Proved with supplemental oxygen.   [EA]  5409 She did take her evening dose of blood pressure medications here.   [JK]    Clinical Course User Index [JK] Dorie Rank, MD    Patient was sent to the emergency room for an elevated d-dimer to rule out pulmonary embolism.  CT scan does not show any pulmonary embolism however while the patient was here she remained very hypertensive.  She also had hypoxia especially with any activity or exertion.  Patient's findings do suggest a component of congestive heart failure but I am also concerned about the possibility of pulmonary hypertension with her hypoxia.  I have started the patient on diuretics.  Of also added nitroglycerin we will give her antihypertensive agents.  At this point I think she will need to be transferred and admitted to  the hospital for further evaluation.   Pt was notified about the pulm nodules and need for repeat CT scan in the future Final Clinical Impressions(s) / ED Diagnoses   Final diagnoses:  Uncontrolled hypertension  Obesity, unspecified classification, unspecified obesity type, unspecified whether serious comorbidity present  Acute congestive heart failure, unspecified heart failure type [facility])      Dorie Rank, MD 05/10/18 2346

## 2018-05-10 NOTE — Progress Notes (Signed)
Subjective:  Patient ID: Madison Palmer, female    DOB: 07-May-1965  Age: 53 y.o. MRN: [id number]  CC: Follow-up (high BP and low oxygen. pt report at home BP today 134/89. )  HPI Ms. Kunde presents today for evaluation of low oxygen levels registered during OV with GI and weight loss clinic. She denies any SO or chest pain. She has chronic intermittent cough which has not changed. She has nuclear stress test schedule 05/01/2018 and appt with cardiology 05/15/2018.  Reviewed past Medical, Social and Family history today.  Outpatient Medications Prior to Visit  Medication Sig Dispense Refill  . acetaminophen (TYLENOL) 500 MG tablet Take 1,000 mg by mouth 3 (three) times daily as needed for moderate pain or headache.    Marland Kitchen atenolol (TENORMIN) 25 MG tablet Take 25 mg by mouth every evening.   0  . benzonatate (TESSALON) 100 MG capsule Take 1 capsule (100 mg total) by mouth 3 (three) times daily as needed for cough. 20 capsule 0  . cetirizine (ZYRTEC ALLERGY) 10 MG tablet Take 1 tablet (10 mg total) by mouth daily. (Patient taking differently: Take 10 mg by mouth daily as needed for allergies. ) 30 tablet 5  . ergocalciferol (VITAMIN D2) 1.25 MG (50000 UT) capsule Take 1 capsule (50,000 Units total) by mouth 3 (three) times a week. 12 capsule 0  . esomeprazole (NEXIUM) 40 MG capsule TAKE ONE CAPSULE BY MOUTH TWICE DAILY 180 capsule 0  . fluticasone (FLONASE) 50 MCG/ACT nasal spray Place 2 sprays into both nostrils daily.    Marland Kitchen guaiFENesin-dextromethorphan (ROBITUSSIN DM) 100-10 MG/5ML syrup Take 10 mLs by mouth every 8 (eight) hours as needed for cough. 118 mL 0  . hydrALAZINE (APRESOLINE) 50 MG tablet Take 1tab in morning and afternoon, and 2tabs at bedtime 120 tablet 6  . levothyroxine (SYNTHROID, LEVOTHROID) 75 MCG tablet TK 1 T PO QD  MONDAY-FRIDAY  0  . polyethylene glycol-electrolytes (NULYTELY/GOLYTELY) 420 g solution Take 4,000 mLs by mouth as directed. 4000 mL 0  . Vitamin D,  Ergocalciferol, (DRISDOL) 1.25 MG (50000 UT) CAPS capsule Take 1 capsule (50,000 Units total) by mouth every 7 (seven) days. 4 capsule 0  . amLODipine (NORVASC) 2.5 MG tablet Take 1 tablet (2.5 mg total) by mouth daily. 90 tablet 23  . cefixime (SUPRAX) 400 MG CAPS capsule Take 1 capsule (400 mg total) by mouth daily. 10 capsule 0  . hydrochlorothiazide (HYDRODIURIL) 50 MG tablet Take 50 mg by mouth daily.    Marland Kitchen ibuprofen (ADVIL,MOTRIN) 200 MG tablet Take 400 mg by mouth daily as needed for headache or moderate pain.    . metFORMIN (GLUCOPHAGE) 500 MG tablet Take 1 tablet (500 mg total) by mouth daily with breakfast. 30 tablet 0  . omega-3 fish oil (MAXEPA) 1000 MG CAPS capsule Take 2 capsules (2,000 mg total) by mouth 2 (two) times daily. (Patient not taking: Reported on 05/10/2018) 90 each    No facility-administered medications prior to visit.     ROS See HPI  Objective:  BP (!) 170/92   Pulse 90   Temp 98.5 F (36.9 C) (Oral)   Ht 5\' 2"  (1.575 m)   Wt (!) 348 lb (157.9 kg)   SpO2 91%   BMI 63.65 kg/m   BP Readings from Last 3 Encounters:  05/13/18 (!) 144/74  05/10/18 (!) 170/92  05/09/18 (!) 176/102    Wt Readings from Last 3 Encounters:  05/13/18 (!) 341 lb 11.4 oz (155 kg)  05/10/18 Marland Kitchen)  348 lb (157.9 kg)  05/09/18 (!) 348 lb (157.9 kg)   ECG: Sinus Arrhythmia.  Physical Exam Vitals signs reviewed.  Cardiovascular:     Rate and Rhythm: Normal rate. Rhythm irregular.     Pulses: Normal pulses.     Heart sounds: Normal heart sounds.  Pulmonary:     Effort: Pulmonary effort is normal.     Breath sounds: Normal breath sounds.  Musculoskeletal:     Right lower leg: Edema present.     Left lower leg: Edema present.  Neurological:     Mental Status: She is alert and oriented to person, place, and time.     Lab Results  Component Value Date   WBC 6.8 05/13/2018   HGB 12.9 05/13/2018   HCT 43.5 05/13/2018   PLT 253 05/13/2018   GLUCOSE 106 (H) 05/13/2018    CHOL 196 02/27/2018   TRIG 142 02/27/2018   HDL 46 02/27/2018   LDLCALC 122 (H) 02/27/2018   ALT 39 05/11/2018   AST 25 05/11/2018   NA 141 05/13/2018   K 3.8 05/13/2018   CL 95 (L) 05/13/2018   CREATININE 0.94 05/13/2018   BUN 14 05/13/2018   CO2 43 (H) 05/13/2018   TSH 0.938 05/11/2018   HGBA1C 6.0 (H) 02/27/2018    US Breast Ltd Uni Left Inc Axilla  Result Date: 04/23/2018 CLINICAL DATA:  Patient presents with symptoms of left nipple itching and pain. No nipple skin discoloration or scaling. No discharge. EXAM: DIGITAL DIAGNOSTIC LEFT MAMMOGRAM WITH CAD AND TOMO ULTRASOUND LEFT BREAST COMPARISON:  Previous exam(s). ACR Breast Density Category b: There are scattered areas of fibroglandular density. FINDINGS: There are no discrete masses, areas of architectural distortion, areas of significant asymmetry or suspicious calcifications. No mammographic change. Mammographic images were processed with CAD. On physical exam, no mass is palpated in the retroareolar left breast. There is no skin discoloration or scaling. Targeted ultrasound is performed, showing normal tissue in the retroareolar left breast. No masses. No duct ectasia. IMPRESSION: Negative exam.  No evidence of breast malignancy. RECOMMENDATION: Screening mammogram in July 2020.(Code:SM-B-01Y) I have discussed the findings and recommendations with the patient. Results were also provided in writing at the conclusion of the visit. If applicable, a reminder letter will be sent to the patient regarding the next appointment. BI-RADS CATEGORY  1: Negative. Electronically Signed   By: [name] M.D.   On: 04/23/2018 11:49   Mm Diag Breast Tomo Uni Left  Result Date: 04/23/2018 CLINICAL DATA:  Patient presents with symptoms of left nipple itching and pain. No nipple skin discoloration or scaling. No discharge. EXAM: DIGITAL DIAGNOSTIC LEFT MAMMOGRAM WITH CAD AND TOMO ULTRASOUND LEFT BREAST COMPARISON:  Previous exam(s). ACR Breast  Density Category b: There are scattered areas of fibroglandular density. FINDINGS: There are no discrete masses, areas of architectural distortion, areas of significant asymmetry or suspicious calcifications. No mammographic change. Mammographic images were processed with CAD. On physical exam, no mass is palpated in the retroareolar left breast. There is no skin discoloration or scaling. Targeted ultrasound is performed, showing normal tissue in the retroareolar left breast. No masses. No duct ectasia. IMPRESSION: Negative exam.  No evidence of breast malignancy. RECOMMENDATION: Screening mammogram in July 2020.(Code:SM-B-01Y) I have discussed the findings and recommendations with the patient. Results were also provided in writing at the conclusion of the visit. If applicable, a reminder letter will be sent to the patient regarding the next appointment. BI-RADS CATEGORY  1: Negative. Electronically Signed  By: [name] M.D.   On: 04/23/2018 11:49    Assessment & Plan:   Rheannon was seen today for follow-up.  Diagnoses and all orders for this visit:  Hypoxia -     D-Dimer, Quantitative  Sinus arrhythmia -     DG Chest 2 View -     Cancel: D-Dimer, Quantitative -     EKG 12-Lead  Pulmonary vascular congestion -     Discontinue: furosemide (LASIX) 40 MG tablet; Take 1 tablet (40 mg total) by mouth daily. -     Discontinue: potassium chloride SA (K-DUR,KLOR-CON) 20 MEQ tablet; Take 1 tablet (20 mEq total) by mouth daily.   I am having Shayli L. Mcwethy maintain her atenolol, fluticasone, hydrALAZINE, levothyroxine, cetirizine, metFORMIN, polyethylene glycol-electrolytes, ergocalciferol, esomeprazole, benzonatate, guaiFENesin-dextromethorphan, acetaminophen, and Vitamin D (Ergocalciferol).  Meds ordered this encounter  Medications  . DISCONTD: furosemide (LASIX) 40 MG tablet    Sig: Take 1 tablet (40 mg total) by mouth daily.    Dispense:  3 tablet    Refill:  0    Order Specific  Question:   Supervising Provider    Answer:   MATTHEWS, CODY [[id number]]  . DISCONTD: potassium chloride SA (K-DUR,KLOR-CON) 20 MEQ tablet    Sig: Take 1 tablet (20 mEq total) by mouth daily.    Dispense:  3 tablet    Refill:  0    Order Specific Question:   Supervising Provider    Answer:   MATTHEWS, CODY [[id number]]    Problem List Items Addressed This Visit    None    Visit Diagnoses    Hypoxia    -  Primary   Relevant Orders   D-Dimer, Quantitative (Completed)   Sinus arrhythmia       Relevant Orders   DG Chest 2 View (Completed)   EKG 12-Lead (Completed)   Pulmonary vascular congestion           Follow-up: No follow-ups on file.  Wilfred Lacy, NP

## 2018-05-10 NOTE — ED Notes (Signed)
[facility] notified (Kim) - patient ready for transport 

## 2018-05-10 NOTE — ED Notes (Signed)
Waiting on labs prior to performing CTA; 20g required in [facility] for CTA

## 2018-05-10 NOTE — ED Notes (Addendum)
Pt ambulated to the restroom and upon returning to her room this RN left her off the oxygen. Checked her room air oxygen saturation and it was at 76% on room air. Placed pt back on oxygen at 2 lpm and her oxygen saturation came back up to 96%.   EDP notified of these readings.

## 2018-05-10 NOTE — Progress Notes (Signed)
Patient states that she uses an albuterol inhaler at home as needed for chronic cough.

## 2018-05-10 NOTE — Progress Notes (Signed)
Patient wears CPAP at home every night.  Patient's CPAP is 9 cm H2O with humidity and a nasal mask.

## 2018-05-10 NOTE — ED Notes (Signed)
Pt reports going to her PCP today for bloodwork and they called her telling her she needed to come to the hospital for an elevated D-dimer. Pt denies any SOB or pain.

## 2018-05-11 ENCOUNTER — Encounter (HOSPITAL_COMMUNITY): Payer: Self-pay | Admitting: Internal Medicine

## 2018-05-11 DIAGNOSIS — M1990 Unspecified osteoarthritis, unspecified site: Secondary | ICD-10-CM | POA: Diagnosis not present

## 2018-05-11 DIAGNOSIS — E213 Hyperparathyroidism, unspecified: Secondary | ICD-10-CM | POA: Diagnosis not present

## 2018-05-11 DIAGNOSIS — E039 Hypothyroidism, unspecified: Secondary | ICD-10-CM

## 2018-05-11 DIAGNOSIS — I160 Hypertensive urgency: Secondary | ICD-10-CM

## 2018-05-11 DIAGNOSIS — E785 Hyperlipidemia, unspecified: Secondary | ICD-10-CM | POA: Diagnosis not present

## 2018-05-11 DIAGNOSIS — I493 Ventricular premature depolarization: Secondary | ICD-10-CM | POA: Diagnosis not present

## 2018-05-11 DIAGNOSIS — Z6841 Body Mass Index (BMI) 40.0 and over, adult: Secondary | ICD-10-CM | POA: Diagnosis not present

## 2018-05-11 DIAGNOSIS — I890 Lymphedema, not elsewhere classified: Secondary | ICD-10-CM | POA: Diagnosis not present

## 2018-05-11 DIAGNOSIS — H409 Unspecified glaucoma: Secondary | ICD-10-CM | POA: Diagnosis not present

## 2018-05-11 DIAGNOSIS — R0602 Shortness of breath: Secondary | ICD-10-CM | POA: Diagnosis present

## 2018-05-11 DIAGNOSIS — R768 Other specified abnormal immunological findings in serum: Secondary | ICD-10-CM | POA: Diagnosis not present

## 2018-05-11 DIAGNOSIS — R7303 Prediabetes: Secondary | ICD-10-CM | POA: Diagnosis not present

## 2018-05-11 DIAGNOSIS — R918 Other nonspecific abnormal finding of lung field: Secondary | ICD-10-CM

## 2018-05-11 DIAGNOSIS — E876 Hypokalemia: Secondary | ICD-10-CM | POA: Diagnosis not present

## 2018-05-11 DIAGNOSIS — G4733 Obstructive sleep apnea (adult) (pediatric): Secondary | ICD-10-CM | POA: Diagnosis not present

## 2018-05-11 DIAGNOSIS — E559 Vitamin D deficiency, unspecified: Secondary | ICD-10-CM | POA: Diagnosis not present

## 2018-05-11 DIAGNOSIS — E209 Hypoparathyroidism, unspecified: Secondary | ICD-10-CM | POA: Diagnosis not present

## 2018-05-11 DIAGNOSIS — I5033 Acute on chronic diastolic (congestive) heart failure: Secondary | ICD-10-CM | POA: Diagnosis not present

## 2018-05-11 DIAGNOSIS — Z9884 Bariatric surgery status: Secondary | ICD-10-CM | POA: Diagnosis not present

## 2018-05-11 DIAGNOSIS — I110 Hypertensive heart disease with heart failure: Secondary | ICD-10-CM | POA: Diagnosis not present

## 2018-05-11 DIAGNOSIS — J9621 Acute and chronic respiratory failure with hypoxia: Secondary | ICD-10-CM | POA: Diagnosis not present

## 2018-05-11 DIAGNOSIS — J9601 Acute respiratory failure with hypoxia: Secondary | ICD-10-CM | POA: Diagnosis not present

## 2018-05-11 DIAGNOSIS — K219 Gastro-esophageal reflux disease without esophagitis: Secondary | ICD-10-CM | POA: Diagnosis not present

## 2018-05-11 DIAGNOSIS — I272 Pulmonary hypertension, unspecified: Secondary | ICD-10-CM | POA: Diagnosis not present

## 2018-05-11 DIAGNOSIS — I878 Other specified disorders of veins: Secondary | ICD-10-CM | POA: Diagnosis not present

## 2018-05-11 HISTORY — DX: Hypertensive urgency: I16.0

## 2018-05-11 LAB — CBC WITH DIFFERENTIAL/PLATELET
Abs Immature Granulocytes: 0.03 10*3/uL (ref 0.00–0.07)
BASOS ABS: 0 10*3/uL (ref 0.0–0.1)
Basophils Relative: 0 %
EOS PCT: 1 %
Eosinophils Absolute: 0.1 10*3/uL (ref 0.0–0.5)
HEMATOCRIT: 43.8 % (ref 36.0–46.0)
Hemoglobin: 13.3 g/dL (ref 12.0–15.0)
Immature Granulocytes: 1 %
Lymphocytes Relative: 16 %
Lymphs Abs: 1 10*3/uL (ref 0.7–4.0)
MCH: 25.3 pg — ABNORMAL LOW (ref 26.0–34.0)
MCHC: 30.4 g/dL (ref 30.0–36.0)
MCV: 83.4 fL (ref 80.0–100.0)
Monocytes Absolute: 0.5 10*3/uL (ref 0.1–1.0)
Monocytes Relative: 7 %
Neutro Abs: 4.6 10*3/uL (ref 1.7–7.7)
Neutrophils Relative %: 75 %
Platelets: 245 10*3/uL (ref 150–400)
RBC: 5.25 MIL/uL — ABNORMAL HIGH (ref 3.87–5.11)
RDW: 16.8 % — ABNORMAL HIGH (ref 11.5–15.5)
WBC: 6.2 10*3/uL (ref 4.0–10.5)
nRBC: 0 % (ref 0.0–0.2)

## 2018-05-11 LAB — BASIC METABOLIC PANEL
Anion gap: 11 (ref 5–15)
BUN: 14 mg/dL (ref 6–20)
CO2: 36 mmol/L — ABNORMAL HIGH (ref 22–32)
Calcium: 9.5 mg/dL (ref 8.9–10.3)
Chloride: 95 mmol/L — ABNORMAL LOW (ref 98–111)
Creatinine, Ser: 0.88 mg/dL (ref 0.44–1.00)
GFR calc Af Amer: >60 mL/min (ref >60)
GLUCOSE: 103 mg/dL — AB (ref 70–99)
POTASSIUM: 2.8 mmol/L — AB (ref 3.5–5.1)
Sodium: 142 mmol/L (ref 135–145)

## 2018-05-11 LAB — HEPATIC FUNCTION PANEL
ALT: 39 U/L (ref 0–44)
AST: 25 U/L (ref 15–41)
Albumin: 3.1 g/dL — ABNORMAL LOW (ref 3.5–5.0)
Alkaline Phosphatase: 71 U/L (ref 38–126)
Bilirubin, Direct: 0.3 mg/dL — ABNORMAL HIGH (ref 0.0–0.2)
Indirect Bilirubin: 1.3 mg/dL — ABNORMAL HIGH (ref 0.3–0.9)
Total Bilirubin: 1.6 mg/dL — ABNORMAL HIGH (ref 0.3–1.2)
Total Protein: 6.1 g/dL — ABNORMAL LOW (ref 6.5–8.1)

## 2018-05-11 LAB — MAGNESIUM: Magnesium: 1.8 mg/dL (ref 1.7–2.4)

## 2018-05-11 LAB — TSH: TSH: 0.938 u[IU]/mL (ref 0.350–4.500)

## 2018-05-11 LAB — HIV ANTIBODY (ROUTINE TESTING W REFLEX): HIV SCREEN 4TH GENERATION: NONREACTIVE

## 2018-05-11 LAB — TROPONIN I
Troponin I: <0.03 ng/mL (ref <0.03)
Troponin I: <0.03 ng/mL (ref <0.03)
Troponin I: <0.03 ng/mL (ref <0.03)

## 2018-05-11 MED ORDER — HYDRALAZINE HCL 20 MG/ML IJ SOLN: 10.0000 mg | INTRAMUSCULAR | Status: DC | PRN

## 2018-05-11 MED ORDER — FLUTICASONE PROPIONATE 50 MCG/ACT NA SUSP
2.0000 | Freq: Every day | NASAL | Status: DC
  Administered 2018-05-11 – 2018-05-13 (×3): 2 via NASAL
  Filled 2018-05-11: qty 16

## 2018-05-11 MED ORDER — POTASSIUM CHLORIDE CRYS ER 20 MEQ PO TBCR
40.0000 meq | EXTENDED_RELEASE_TABLET | Freq: Once | ORAL | Status: AC
  Administered 2018-05-11: 40 meq via ORAL
  Filled 2018-05-11: qty 2

## 2018-05-11 MED ORDER — ENOXAPARIN SODIUM 80 MG/0.8ML ~~LOC~~ SOLN
80.0000 mg | SUBCUTANEOUS | Status: DC
  Administered 2018-05-11 – 2018-05-12 (×2): 80 mg via SUBCUTANEOUS
  Filled 2018-05-11 (×2): qty 0.8

## 2018-05-11 MED ORDER — ACETAMINOPHEN 650 MG RE SUPP: 650.0000 mg | Freq: Four times a day (QID) | RECTAL | Status: DC | PRN

## 2018-05-11 MED ORDER — FUROSEMIDE 10 MG/ML IJ SOLN
40.0000 mg | Freq: Every day | INTRAMUSCULAR | Status: DC
  Administered 2018-05-11: 40 mg via INTRAVENOUS
  Filled 2018-05-11: qty 4

## 2018-05-11 MED ORDER — AMLODIPINE BESYLATE 5 MG PO TABS
5.0000 mg | ORAL_TABLET | Freq: Every day | ORAL | Status: DC
  Administered 2018-05-12 – 2018-05-13 (×2): 5 mg via ORAL
  Filled 2018-05-11 (×2): qty 1

## 2018-05-11 MED ORDER — POTASSIUM CHLORIDE CRYS ER 20 MEQ PO TBCR
20.0000 meq | EXTENDED_RELEASE_TABLET | Freq: Once | ORAL | Status: AC
  Administered 2018-05-11: 20 meq via ORAL
  Filled 2018-05-11: qty 1

## 2018-05-11 MED ORDER — ONDANSETRON HCL 4 MG/2ML IJ SOLN: 4.0000 mg | Freq: Four times a day (QID) | INTRAMUSCULAR | Status: DC | PRN

## 2018-05-11 MED ORDER — LEVOTHYROXINE SODIUM 75 MCG PO TABS
75.0000 ug | ORAL_TABLET | ORAL | Status: DC
  Administered 2018-05-13: 75 ug via ORAL
  Filled 2018-05-11: qty 1

## 2018-05-11 MED ORDER — AMLODIPINE BESYLATE 5 MG PO TABS
2.5000 mg | ORAL_TABLET | Freq: Every day | ORAL | Status: DC
  Administered 2018-05-11: 2.5 mg via ORAL
  Filled 2018-05-11: qty 1

## 2018-05-11 MED ORDER — HYDRALAZINE HCL 20 MG/ML IJ SOLN
10.0000 mg | Freq: Four times a day (QID) | INTRAMUSCULAR | Status: DC | PRN
  Administered 2018-05-11: 10 mg via INTRAVENOUS
  Filled 2018-05-11: qty 1

## 2018-05-11 MED ORDER — ATENOLOL 25 MG PO TABS
25.0000 mg | ORAL_TABLET | Freq: Every evening | ORAL | Status: DC
  Administered 2018-05-11 – 2018-05-12 (×2): 25 mg via ORAL
  Filled 2018-05-11 (×2): qty 1

## 2018-05-11 MED ORDER — OMEGA-3 FISH OIL 1000 MG PO CAPS: 2.0000 | ORAL_CAPSULE | Freq: Two times a day (BID) | ORAL | Status: DC

## 2018-05-11 MED ORDER — HYDRALAZINE HCL 50 MG PO TABS
100.0000 mg | ORAL_TABLET | Freq: Every day | ORAL | Status: DC
  Administered 2018-05-11 – 2018-05-12 (×2): 100 mg via ORAL
  Filled 2018-05-11 (×2): qty 2

## 2018-05-11 MED ORDER — POTASSIUM CHLORIDE CRYS ER 20 MEQ PO TBCR
20.0000 meq | EXTENDED_RELEASE_TABLET | Freq: Every day | ORAL | Status: DC
  Administered 2018-05-11 – 2018-05-13 (×3): 20 meq via ORAL
  Filled 2018-05-11 (×3): qty 1

## 2018-05-11 MED ORDER — HYDRALAZINE HCL 50 MG PO TABS
50.0000 mg | ORAL_TABLET | Freq: Two times a day (BID) | ORAL | Status: DC
  Administered 2018-05-11 – 2018-05-13 (×5): 50 mg via ORAL
  Filled 2018-05-11 (×5): qty 1

## 2018-05-11 MED ORDER — ONDANSETRON HCL 4 MG PO TABS: 4.0000 mg | ORAL_TABLET | Freq: Four times a day (QID) | ORAL | Status: DC | PRN

## 2018-05-11 MED ORDER — MAGNESIUM SULFATE 2 GM/50ML IV SOLN
2.0000 g | Freq: Once | INTRAVENOUS | Status: AC
  Administered 2018-05-11: 2 g via INTRAVENOUS
  Filled 2018-05-11: qty 50

## 2018-05-11 MED ORDER — ACETAMINOPHEN 325 MG PO TABS
650.0000 mg | ORAL_TABLET | Freq: Four times a day (QID) | ORAL | Status: DC | PRN
  Administered 2018-05-11 – 2018-05-13 (×4): 650 mg via ORAL
  Filled 2018-05-11 (×4): qty 2

## 2018-05-11 MED ORDER — PANTOPRAZOLE SODIUM 40 MG PO TBEC
40.0000 mg | DELAYED_RELEASE_TABLET | Freq: Every day | ORAL | Status: DC
  Administered 2018-05-11 – 2018-05-13 (×3): 40 mg via ORAL
  Filled 2018-05-11 (×4): qty 1

## 2018-05-11 MED ORDER — HYDROCHLOROTHIAZIDE 25 MG PO TABS
50.0000 mg | ORAL_TABLET | Freq: Every day | ORAL | Status: DC
  Administered 2018-05-11: 50 mg via ORAL
  Filled 2018-05-11 (×2): qty 2

## 2018-05-11 NOTE — Consult Note (Signed)
Cardiology Consultation:   Patient ID: DOREENA MAULDEN MRN: [id number]; DOB: 1966-02-01  Admit date: 05/10/2018 Date of Consult: 05/11/2018  Primary Care Provider: Flossie Buffy, NP Primary Cardiologist: Minus Breeding, MD  Primary Electrophysiologist:  None    Patient Profile:   Madison Palmer is a 53 y.o. female with a hx of HTN, LVH w/ grade 1 dd on echo 2019, pre-DM, HTN, HLD, PCOS, GERD, tachycardia, hyperparathyroid, lymphedema,  sleep apnea, who is being seen today for the evaluation shortness of breath at the request of Dr. Tamala Julian  History of Present Illness:   Ms. Matousek is a 53 year old female with the above-noted complex past medical history who we are being asked to see for shortness of breath.  She went to her primary care physician who obtained a d-dimer and chest x-ray.  The d-dimer was not negative and a CT pulmonary embolism study was obtained.  She had a minimally elevated troponin.  Her blood pressure was extremely high at 203/109 mmHg at the time.  She was also found to be hypoxic.  She is not followed by a cardiologist for many years and has been known to have severe LVH felt to be related to hypertension, however she is currently planning to have an evaluation for cardiac amyloidosis with her primary cardiologist Dr. Percival Spanish.  Ms. Vogt was to have an endoscopy and colonoscopy. The procedures were canceled because of hypoxia.  She called the office on 05/08/2018 to let us know that. Additionally, her blood pressure was 200/93.  She also reported lower extremity edema and weight gain.  She has chronic lymphedema. A myocardial amyloid PYP scan is scheduled for 05/14/2018 and follow-up with Dr. Percival Spanish on 05/15/2018.  She feels as if her health has not changed significantly since November 2019 when she had her last echocardiogram.  Due to hyperparathyroidism and being on HCTZ, her dose of HCTZ was cut in half.  At that time she felt that she had worsening of exertional  shortness of breath and fatigue.  She self increased her HCTZ back up to her regular dose, and felt that she was able to remove fluid from her body and was more functional.  She last saw Dr. Percival Spanish in November 2019 for difficult to control hypertension and left ventricular hypertrophy.  She is also noted to have ventricular bigeminy at that time.  She is noted to have a perfusion study from 2018 that demonstrated no evidence of ischemia or infarct.  I have independently reviewed the images from her CT pulmonary embolism study which is not a dedicated study for coronary arteries.  However there is evidence of coronary artery calcifications.  She denies chest pain, but does endorse shortness of breath with exertion.  She does not seem to have resting shortness of breath.  Denies current palpitations.  Endorses chronic lymphedema.  For hypertension she was recommended to take Norvasc.  She had PVCs but was not symptomatic and a Holter showed no significant arrhythmias.  Her current cardiovascular medications include amlodipine 2.5 mg daily, atenolol 25 mg daily, furosemide 40 mg daily, hydralazine 50 mg in the morning and 100 mg at night, hydrochlorothiazide 50 mg daily.  Past Medical History:  Diagnosis Date  . Anemia   . Anxiety   . Arrhythmia    tachycardia  . Arthritis   . Chickenpox   . Depression   . Diverticulitis   . GERD (gastroesophageal reflux disease)   . Glaucoma   . Hyperlipidemia   . Hyperparathyroidism ([place])   .  Hypertension   . Inflammatory polyps of colon ([place])   . LVH (left ventricular hypertrophy)   . Lymphedema   . PCOS (polycystic ovarian syndrome)   . Prediabetes   . Recurrent UTI   . Sleep apnea    CPAP  . Thyroid disease   . Vitamin D deficiency     Past Surgical History:  Procedure Laterality Date  . BREAST BIOPSY  2015  . CESAREAN SECTION  2004  . INNER EAR SURGERY     ear and sinus surgery  . LAPAROSCOPIC REPAIR AND REMOVAL OF GASTRIC BAND    .  OOPHORECTOMY Left   . TONSILLECTOMY AND ADENOIDECTOMY       Home Medications:  Prior to Admission medications   Medication Sig Start Date End Date Taking? Authorizing Provider  acetaminophen (TYLENOL) 500 MG tablet Take 1,000 mg by mouth 3 (three) times daily as needed for moderate pain or headache.   Yes [provider]  amLODipine (NORVASC) 2.5 MG tablet Take 1 tablet (2.5 mg total) by mouth daily. 03/12/18  Yes Minus Breeding, MD  atenolol (TENORMIN) 25 MG tablet Take 25 mg by mouth every evening.  02/16/17  Yes [provider]  ergocalciferol (VITAMIN D2) 1.25 MG (50000 UT) capsule Take 1 capsule (50,000 Units total) by mouth 3 (three) times a week. 04/03/18  Yes Eber Jones, MD  esomeprazole (NEXIUM) 40 MG capsule TAKE ONE CAPSULE BY MOUTH TWICE DAILY 04/11/18  Yes Mansouraty, Telford Nab., MD  fluticasone [facility]) 50 MCG/ACT nasal spray Place 2 sprays into both nostrils daily.   Yes [provider]  furosemide (LASIX) 40 MG tablet Take 1 tablet (40 mg total) by mouth daily. 05/10/18  Yes [name], Charlene Brooke, NP  hydrALAZINE (APRESOLINE) 50 MG tablet Take 1tab in morning and afternoon, and 2tabs at bedtime 12/11/17  Yes [name], Charlene Brooke, NP  hydrochlorothiazide (HYDRODIURIL) 50 MG tablet Take 50 mg by mouth daily.   Yes [provider]  ibuprofen (ADVIL,MOTRIN) 200 MG tablet Take 400 mg by mouth daily as needed for headache or moderate pain.   Yes [provider]  levothyroxine (SYNTHROID, LEVOTHROID) 75 MCG tablet TK 1 T PO QD  MONDAY-FRIDAY 01/25/18  Yes Renato Shin, MD  polyethylene glycol-electrolytes (NULYTELY/GOLYTELY) 420 g solution Take 4,000 mLs by mouth as directed. 03/25/18  Yes Mansouraty, Telford Nab., MD  benzonatate (TESSALON) 100 MG capsule Take 1 capsule (100 mg total) by mouth 3 (three) times daily as needed for cough. 04/11/18   [name], Charlene Brooke, NP  cefixime (SUPRAX) 400 MG CAPS capsule Take 1 capsule (400 mg  total) by mouth daily. 04/11/18   [name], Charlene Brooke, NP  cetirizine (ZYRTEC ALLERGY) 10 MG tablet Take 1 tablet (10 mg total) by mouth daily. Patient taking differently: Take 10 mg by mouth daily as needed for allergies.  03/13/18   [name], Charlene Brooke, NP  guaiFENesin-dextromethorphan (ROBITUSSIN DM) 100-10 MG/5ML syrup Take 10 mLs by mouth every 8 (eight) hours as needed for cough. 04/11/18   [name], Charlene Brooke, NP  metFORMIN (GLUCOPHAGE) 500 MG tablet Take 1 tablet (500 mg total) by mouth daily with breakfast. 03/14/18 05/07/18  Eber Jones, MD  omega-3 fish oil (MAXEPA) 1000 MG CAPS capsule Take 2 capsules (2,000 mg total) by mouth 2 (two) times daily. Patient not taking: Reported on 05/10/2018 09/12/17   [name], Charlene Brooke, NP  potassium chloride SA (K-DUR,KLOR-CON) 20 MEQ tablet Take 1 tablet (20 mEq total) by mouth daily. 05/10/18   [name],  Charlene Brooke, NP  Vitamin D, Ergocalciferol, (DRISDOL) 1.25 MG (50000 UT) CAPS capsule Take 1 capsule (50,000 Units total) by mouth every 7 (seven) days. 05/09/18   Eber Jones, MD    Inpatient Medications: Scheduled Meds: . amLODipine  2.5 mg Oral Daily  . atenolol  25 mg Oral QPM  . enoxaparin (LOVENOX) injection  80 mg Subcutaneous Q24H  . fluticasone  2 spray Each Nare Daily  . furosemide  40 mg Intravenous Daily  . hydrALAZINE  100 mg Oral QHS  . hydrALAZINE  50 mg Oral BID WC  . [START ON 05/13/2018] levothyroxine  75 mcg Oral Q MTWThF  . nitroGLYCERIN  1 inch Topical Q6H  . pantoprazole  40 mg Oral Daily  . potassium chloride SA  20 mEq Oral Daily   Continuous Infusions:  PRN Meds: acetaminophen **OR** acetaminophen, albuterol, hydrALAZINE, ondansetron **OR** ondansetron (ZOFRAN) IV  Allergies:    Allergies  Allergen Reactions  . Fentanyl Hives  . Levofloxacin Hives  . Midazolam Hives  . Pollen Extract     seasonal  . Atorvastatin     Muscle pain in legs   . Rosuvastatin     Abdominal pain    Social History:    Social History   Socioeconomic History  . Marital status: Married    Spouse name: Not on file  . Number of children: 1  . Years of education: Not on file  . Highest education level: Not on file  Occupational History  . Occupation: Unemployed  Social Needs  . Financial resource strain: Not on file  . Food insecurity:    Worry: Not on file    Inability: Not on file  . Transportation needs:    Medical: Not on file    Non-medical: Not on file  Tobacco Use  . Smoking status: Never Smoker  . Smokeless tobacco: Never Used  Substance and Sexual Activity  . Alcohol use: Yes    Comment: social  . Drug use: No  . Sexual activity: Not Currently  Lifestyle  . Physical activity:    Days per week: Not on file    Minutes per session: Not on file  . Stress: Not on file  Relationships  . Social connections:    Talks on phone: Not on file    Gets together: Not on file    Attends religious service: Not on file    Active member of club or organization: Not on file    Attends meetings of clubs or organizations: Not on file    Relationship status: Not on file  . Intimate partner violence:    Fear of current or ex partner: Not on file    Emotionally abused: Not on file    Physically abused: Not on file    Forced sexual activity: Not on file  Other Topics Concern  . Not on file  Social History Narrative   Lives with son and companion.      Family History:    Family History  Adopted: Yes  Problem Relation Age of Onset  . Hearing loss Son        right   . Healthy Son      ROS:  Please see the history of present illness.   All other ROS reviewed and negative.     Physical Exam/Data:   Vitals:   05/11/18 0702 05/11/18 0724 05/11/18 1154 05/11/18 1156  BP: (!) 155/73 (!) 145/78 (!) 142/92 132/86  Pulse: 73 70 73 71  Resp:      Temp:  97.8 F (36.6 C)    TempSrc:  Oral    SpO2:  96%    Weight:      Height:        Intake/Output Summary (Last 24 hours) at 05/11/2018  1659 Last data filed at 05/11/2018 0100 Gross per 24 hour  Intake -  Output 900 ml  Net -900 ml   Last 3 Weights 05/11/2018 05/11/2018 05/10/2018  Weight (lbs) 342 lb 9.5 oz 343 lb 7.6 oz 347 lb 14.2 oz  Weight (kg) 155.4 kg 155.8 kg 157.8 kg     Body mass index is 62.66 kg/m.  General:  Well nourished, well developed, in no acute distress HEENT: normal Neck: no JVD Vascular: No carotid bruits; FA pulses 2+ bilaterally without bruits  Cardiac:  normal S1, S2. S4 is present; RRR; no murmur Lungs:  clear to auscultation bilaterally, no wheezing, rhonchi or rales  Abd: soft, nontender, no hepatomegaly  Ext: Diffuse bilateral edema, nonpitting Musculoskeletal:  No deformities, BUE and BLE strength normal and equal Skin: warm and dry  Neuro:  CNs 2-12 intact, no focal abnormalities noted Psych:  Normal affect   EKG:  The EKG was personally reviewed and demonstrates: Normal sinus rhythm with right atrial enlargement Telemetry:  Telemetry was personally reviewed and demonstrates: 13 beat run of nonsustained VT, intermittent PVCs.  Otherwise normal sinus rhythm.  Relevant CV Studies: No new  Laboratory Data:  Chemistry Recent Labs  Lab 05/10/18 2120 05/11/18 0630  NA 139 142  K 3.0* 2.8*  CL 97* 95*  CO2 35* 36*  GLUCOSE 117* 103*  BUN 22* 14  CREATININE 0.94 0.88  CALCIUM 9.8 9.5  GFRNONAA >60 >60  GFRAA >60 >60  ANIONGAP 7 11    Recent Labs  Lab 05/11/18 0630  PROT 6.1*  ALBUMIN 3.1*  AST 25  ALT 39  ALKPHOS 71  BILITOT 1.6*   Hematology Recent Labs  Lab 05/10/18 2120 05/11/18 0630  WBC 7.6 6.2  RBC 5.63* 5.25*  HGB 14.4 13.3  HCT 47.8* 43.8  MCV 84.9 83.4  MCH 25.6* 25.3*  MCHC 30.1 30.4  RDW 17.1* 16.8*  PLT 252 245   Cardiac Enzymes Recent Labs  Lab 05/10/18 2120 05/11/18 0630 05/11/18 1255  TROPONINI 0.04* <0.03 <0.03   No results for input(s): TROPIPOC in the last 168 hours.  BNP Recent Labs  Lab 05/10/18 2120  BNP 199.8*    DDimer    Recent Labs  Lab 05/10/18 1454  DDIMER 0.64*    Radiology/Studies:  Dg Chest 2 View  Result Date: 05/10/2018 CLINICAL DATA:  Arrhythmia. Hypoxia. EXAM: CHEST - 2 VIEW COMPARISON:  None. FINDINGS: There is cardiomegaly with pulmonary vascular congestion. No discrete effusions. No acute bone abnormality. IMPRESSION: Cardiomegaly with pulmonary vascular congestion consistent with mild congestive heart failure. Electronically Signed   By: Lorriane Shire M.D.   On: 05/10/2018 15:42   Ct Angio Chest Pe W And/or Wo Contrast  Result Date: 05/10/2018 CLINICAL DATA:  Hypoxia room air. History of hypertension, polycystic ovarian syndrome left ventricular hypertrophy and GERD. History of breast biopsy in 2015 and left oophorectomy. EXAM: CT ANGIOGRAPHY CHEST WITH CONTRAST TECHNIQUE: Multidetector CT imaging of the chest was performed using the standard protocol during bolus administration of intravenous contrast. Multiplanar CT image reconstructions and MIPs were obtained to evaluate the vascular anatomy. CONTRAST:  189mL ISOVUE-370 IOPAMIDOL (ISOVUE-370) INJECTION 76% COMPARISON:  CXR 05/10/2018 FINDINGS: Cardiovascular: Cardiomegaly without pericardial effusion  or thickening. Nonaneurysmal thoracic aorta without dissection. Aortic atherosclerosis at the arch. No large central pulmonary embolus to the segmental level. Satisfactory opacification of the pulmonary arteries is noted to the segmental level. Mediastinum/Nodes: Nonpathologic sized mediastinal lymph nodes, largest is lower paratracheal measuring up to 9 mm short axis. No hilar nor axillary lymphadenopathy. Lungs/Pleura: Too numerous to count bilateral pulmonary nodules, the largest measuring 15 mm in the right lower lobe with next largest in the right middle lobe measuring 9 mm. Largest in the left lung is medially within the lingula at 13 mm. Patchy mosaic attenuation of the lungs, nonspecific but may represent areas of interspersed air trapping,  alveolitis/pneumonitis and/or hypoventilatory change. Upper Abdomen: No enhancing mass of the included liver. The adrenal glands are excluded on this study. Musculoskeletal: No chest wall abnormality. No acute or significant osseous findings. Osteophytes along the mid to lower thoracic spine with degenerative disc disease consistent thoracic spondylosis. Review of the MIP images confirms the above findings. IMPRESSION: 1. No acute pulmonary embolus. 2. There are scattered too numerous to count bilateral pulmonary nodules, the largest is 15 mm in the right lower lobe and in the left lung, 13 mm within the lingula. Although findings could be postinfectious or inflammatory in etiology, the possibility of pulmonary metastasis is of concern. Non-contrast chest CT at 3-6 months is recommended. If the nodules are stable at time of repeat CT, then future CT at 18-24 months (from today's scan) is considered optional for low-risk patients, but is recommended for high-risk patients. This recommendation follows the consensus statement: Guidelines for Management of Incidental Pulmonary Nodules Detected on CT Images: From the Fleischner Society 2017; Radiology 2017; 284:228-243. Can also consider PET-CT or potentially percutaneous sampling of accessible nodules as clinically warranted. 3. Patchy mosaic attenuation of the lungs, nonspecific but may represent areas of interspersed air trapping, alveolitis/pneumonitis and/or hypoventilatory change. 4. Cardiomegaly. Aortic Atherosclerosis (ICD10-I70.0). Electronically Signed   By: Ashley Royalty M.D.   On: 05/10/2018 22:44    Assessment and Plan:   1. Asymptomatic hypoxia 2. Severe LVH with likely mild chronic diastolic CHF 3. Hypertensive urgency 4. Numerous pulmonary nodules and pulmonary mosaic attenuation 5. Coronary artery calcifications on pulmonary embolism CT.    When the patient presented to her outpatient primary care provider, she was seen for low oxygen levels  without acute worsening of shortness of breath.  She was in the emergency department because of a non-negative d-dimer and mild pulmonary vascular congestion on chest x-ray, and was admitted to the hospital because of hypertension.  In addition she was admitted for hypoxia which was incidentally noted, since the patient did not have any new concerns of shortness of breath.  She is already receiving diuretic therapy and has had mild response.  She may have a component of diastolic heart failure, however it does not seem to be the primary driver of her current clinical findings.  Her pulmonary artery pressure on echocardiogram performed in November was RVSP 34 mmHg, which is not significantly elevated.  With her new findings on chest CT, she certainly warrants an outpatient evaluation for pulmonary hypertension, we will leave this to the discretion of her primary cardiologist Dr. Percival Spanish.  Of most concern in this discussion is the finding of innumerable pulmonary nodules and patchy mosaic attenuation of the lungs.  She tells me she has had a PET scan in the past 7 or 8 years for possibly a similar finding, however she was told that this was normal.  It  is unclear if there is been progression of disease since that time. I would strongly recommend the involvement of pulmonary medicine if the primary concern is hypoxia.  We can consider an agitated saline bubble study as an outpatient to evaluate for intracardiac shunt, however this is not emergently needed as an inpatient.  In addition if granulomatous inflammation is found on a biopsy of 1 of her more accessible pulmonary nodules, we should consider an evaluation for cardiac sarcoidosis, however severe left ventricular hypertrophy in the setting of uncontrolled hypertension makes this diagnosis less likely.  It is not abundantly clear to me why she is not on an ACE inhibitor or an ARB, with normal renal function.  I do not see an allergy or intolerance listed  in her chart.  If she can tolerate an ACE inhibitor or an ARB, I would recommend initiating that for further antihypertensive therapy.  I will increase her amlodipine to 5 mg daily starting tomorrow, she may have concerns about lower extremity swelling with this medication change.  [facility] will sign off.   Medication Recommendations: Increase amlodipine to 5 mg daily, can continue other home medications for hypertension, can likely transition to home diuretics tomorrow (HCTZ and Lasix).  Consider adding an ACE inhibitor or ARB if the patient can tolerate and there are no allergies. Other recommendations (labs, testing, etc): To be determined as an outpatient. Follow up as an outpatient: She has a follow-up appointment arranged with Dr. Percival Spanish on May 15, 2018 which I would recommend that she keeps.  For questions or updates, please contact [place] Please consult [email or web address] for contact info under     Signed, Elouise Munroe, MD  05/11/2018 4:59 PM

## 2018-05-11 NOTE — Progress Notes (Signed)
Patient wanted to wait to be put on CPAP when she arrives at [facility].  [facility]  RT Margaretmary Bayley made aware patient is about to be transported.

## 2018-05-11 NOTE — Progress Notes (Signed)
Office: [phone]  /  Fax: [phone]   HPI:   Chief Complaint: OBESITY Madison Palmer is here to discuss her progress with her obesity treatment plan. She is on the Category 3 plan and is following her eating plan approximately 60 % of the time. She states she is exercising 0 minutes 0 times per week. Madison Palmer was busier than she had planned over the holidays, and ate out more than she would have liked. She also indulged quite a bit.  Her weight is (!) 348 lb (157.9 kg) today and has gained 7 pounds since her last visit. She has lost 9 lbs since starting treatment with [place].  Vitamin D Deficiency Madison Palmer has a diagnosis of vitamin D deficiency. She is currently taking prescription Vit D. She notes fatigue and denies nausea, vomiting or muscle weakness.  At risk for osteopenia and osteoporosis Madison Palmer is at higher risk of osteopenia and osteoporosis due to vitamin D deficiency.   Hypertension Madison Palmer is a 53 y.o. female with hypertension. Madison Palmer's blood pressure is very elevated today. She denies chest pain, chest pressure, or headaches. This morning her blood pressure was 117/77. She has a stress test scheduled for 5 days. She is working weight loss to help control her blood pressure with the goal of decreasing her risk of heart attack and stroke. Madison Palmer's blood pressure is not currently controlled.  Hypoxemia Madison Palmer states she does not feel short of breath. She denies chest pain, chest pressure, or orthopnea.  ASSESSMENT AND PLAN:  Vitamin D deficiency - Plan: Vitamin D, Ergocalciferol, (DRISDOL) 1.25 MG (50000 UT) CAPS capsule  Essential hypertension  Hypokalemia  At risk for osteoporosis  Class 3 severe obesity with serious comorbidity and body mass index (BMI) of 60.0 to 69.9 in adult, unspecified obesity type ([place])  PLAN:  Vitamin D Deficiency Madison Palmer was informed that low vitamin D levels contributes to fatigue and are associated with obesity, breast, and colon cancer.  Madison Palmer agrees to continue taking prescription Vit D @50 ,000 IU every week #12 and we will refill for 1 month. She will follow up for routine testing of vitamin D, at least 2-3 times per year. She was informed of the risk of over-replacement of vitamin D and agrees to not increase her dose unless she discusses this with [place] first. Madison Palmer agrees to follow up with our clinic in 2 weeks.  At risk for osteopenia and osteoporosis Madison Palmer was given extended (15 minutes) osteoporosis prevention counseling today. Madison Palmer is at risk for osteopenia and osteoporsis due to her vitamin D deficiency. She was encouraged to take her vitamin D and follow her higher calcium diet and increase strengthening exercise to help strengthen her bones and decrease her risk of osteopenia and osteoporosis.  Hypertension We discussed sodium restriction, working on healthy weight loss, and a regular exercise program as the means to achieve improved blood pressure control. Madison Palmer agreed with this plan and agreed to follow up as directed. We will continue to monitor her blood pressure as well as her progress with the above lifestyle modifications. She will continue her medications and will watch for signs of hypotension as she continues her lifestyle modifications. Madison Palmer is to follow up with her primary care physician, she has been [place] with her primary care physician. We will follow up with stress test. Madison Palmer agrees to follow up with our clinic in 2 weeks.  Hypoxemia Madison Palmer refused to go to the emergency department, as she states she does not feel ill enough to go  to the emergency department. I explained concern for possible myocardial infarction hypertensive emergency and congestive heart failure. However, she continues to refuse going to the emergency department.    Obesity Madison Palmer is currently in the action stage of change. As such, her goal is to continue with weight loss efforts She has agreed to follow the Category 3  plan Madison Palmer has been instructed to work up to a goal of 150 minutes of combined cardio and strengthening exercise per week for weight loss and overall health benefits. We discussed the following Behavioral Modification Strategies today: increasing lean protein intake, increasing vegetables, work on meal planning and easy cooking plans, and planning for success   Madison Palmer has agreed to follow up with our clinic in 2 weeks. She was informed of the importance of frequent follow up visits to maximize her success with intensive lifestyle modifications for her multiple health conditions.  ALLERGIES: Allergies  Allergen Reactions  . Fentanyl Hives  . Levofloxacin Hives  . Midazolam Hives  . Pollen Extract     seasonal  . Atorvastatin     Muscle pain in legs   . Rosuvastatin     Abdominal pain    MEDICATIONS: No current facility-administered medications on file prior to visit.    Current Outpatient Medications on File Prior to Visit  Medication Sig Dispense Refill  . acetaminophen (TYLENOL) 500 MG tablet Take 1,000 mg by mouth 3 (three) times daily as needed for moderate pain or headache.    Marland Kitchen amLODipine (NORVASC) 2.5 MG tablet Take 1 tablet (2.5 mg total) by mouth daily. 90 tablet 23  . atenolol (TENORMIN) 25 MG tablet Take 25 mg by mouth every evening.   0  . benzonatate (TESSALON) 100 MG capsule Take 1 capsule (100 mg total) by mouth 3 (three) times daily as needed for cough. 20 capsule 0  . cefixime (SUPRAX) 400 MG CAPS capsule Take 1 capsule (400 mg total) by mouth daily. 10 capsule 0  . cetirizine (ZYRTEC ALLERGY) 10 MG tablet Take 1 tablet (10 mg total) by mouth daily. (Patient taking differently: Take 10 mg by mouth daily as needed for allergies. ) 30 tablet 5  . ergocalciferol (VITAMIN D2) 1.25 MG (50000 UT) capsule Take 1 capsule (50,000 Units total) by mouth 3 (three) times a week. 12 capsule 0  . esomeprazole (NEXIUM) 40 MG capsule TAKE ONE CAPSULE BY MOUTH TWICE DAILY 180 capsule  0  . fluticasone (FLONASE) 50 MCG/ACT nasal spray Place 2 sprays into both nostrils daily.    Marland Kitchen guaiFENesin-dextromethorphan (ROBITUSSIN DM) 100-10 MG/5ML syrup Take 10 mLs by mouth every 8 (eight) hours as needed for cough. 118 mL 0  . hydrALAZINE (APRESOLINE) 50 MG tablet Take 1tab in morning and afternoon, and 2tabs at bedtime 120 tablet 6  . hydrochlorothiazide (HYDRODIURIL) 50 MG tablet Take 50 mg by mouth daily.    Marland Kitchen ibuprofen (ADVIL,MOTRIN) 200 MG tablet Take 400 mg by mouth daily as needed for headache or moderate pain.    Marland Kitchen levothyroxine (SYNTHROID, LEVOTHROID) 75 MCG tablet TK 1 T PO QD  MONDAY-FRIDAY  0  . omega-3 fish oil (MAXEPA) 1000 MG CAPS capsule Take 2 capsules (2,000 mg total) by mouth 2 (two) times daily. (Patient not taking: Reported on 05/10/2018) 90 each   . polyethylene glycol-electrolytes (NULYTELY/GOLYTELY) 420 g solution Take 4,000 mLs by mouth as directed. 4000 mL 0  . metFORMIN (GLUCOPHAGE) 500 MG tablet Take 1 tablet (500 mg total) by mouth daily with breakfast. 30 tablet 0  PAST MEDICAL HISTORY: Past Medical History:  Diagnosis Date  . Anemia   . Anxiety   . Arrhythmia    tachycardia  . Arthritis   . Chickenpox   . Depression   . Diverticulitis   . GERD (gastroesophageal reflux disease)   . Glaucoma   . Hyperlipidemia   . Hyperparathyroidism ([place])   . Hypertension   . Inflammatory polyps of colon ([place])   . LVH (left ventricular hypertrophy)   . Lymphedema   . PCOS (polycystic ovarian syndrome)   . Prediabetes   . Recurrent UTI   . Sleep apnea    CPAP  . Thyroid disease   . Vitamin D deficiency     PAST SURGICAL HISTORY: Past Surgical History:  Procedure Laterality Date  . BREAST BIOPSY  2015  . CESAREAN SECTION  2004  . INNER EAR SURGERY     ear and sinus surgery  . LAPAROSCOPIC REPAIR AND REMOVAL OF GASTRIC BAND    . OOPHORECTOMY Left   . TONSILLECTOMY AND ADENOIDECTOMY      SOCIAL HISTORY: Social History   Tobacco Use  .  Smoking status: Never Smoker  . Smokeless tobacco: Never Used  Substance Use Topics  . Alcohol use: Yes    Comment: social  . Drug use: No    FAMILY HISTORY: Family History  Adopted: Yes  Problem Relation Age of Onset  . Hearing loss Son        right   . Healthy Son     ROS: Review of Systems  Constitutional: Positive for malaise/fatigue. Negative for weight loss.  Respiratory: Negative for shortness of breath.   Cardiovascular: Negative for chest pain and orthopnea.       Negative chest pressure  Neurological: Negative for headaches.    PHYSICAL EXAM: Blood pressure (!) 176/102, pulse 93, temperature 97.8 F (36.6 C), temperature source Oral, height 5\' 2"  (1.575 m), weight (!) 348 lb (157.9 kg), SpO2 93 %. Body mass index is 63.65 kg/m. Physical Exam Vitals signs reviewed.  Constitutional:      Appearance: Normal appearance. She is obese.  Cardiovascular:     Rate and Rhythm: Normal rate.     Pulses: Normal pulses.  Pulmonary:     Effort: Pulmonary effort is normal.     Breath sounds: Normal breath sounds.  Musculoskeletal: Normal range of motion.  Skin:    General: Skin is warm and dry.  Neurological:     Mental Status: She is alert and oriented to person, place, and time.  Psychiatric:        Mood and Affect: Mood normal.        Behavior: Behavior normal.     RECENT LABS AND TESTS: BMET    Component Value Date/Time   NA 139 05/10/2018 2120   NA 142 02/27/2018 1233   K 3.0 (L) 05/10/2018 2120   CL 97 (L) 05/10/2018 2120   CO2 35 (H) 05/10/2018 2120   GLUCOSE 117 (H) 05/10/2018 2120   BUN 22 (H) 05/10/2018 2120   BUN 15 02/27/2018 1233   CREATININE 0.94 05/10/2018 2120   CREATININE 0.94 12/21/2017 1640   CALCIUM 9.8 05/10/2018 2120   GFRNONAA >60 05/10/2018 2120   GFRAA >60 05/10/2018 2120   Lab Results  Component Value Date   HGBA1C 6.0 (H) 02/27/2018   HGBA1C 5.7 09/10/2017   HGBA1C 5.9 05/29/2017   Lab Results  Component Value Date    INSULIN 9.6 02/27/2018   CBC    Component  Value Date/Time   WBC 7.6 05/10/2018 2120   RBC 5.63 (H) 05/10/2018 2120   HGB 14.4 05/10/2018 2120   HCT 47.8 (H) 05/10/2018 2120   PLT 252 05/10/2018 2120   MCV 84.9 05/10/2018 2120   MCH 25.6 (L) 05/10/2018 2120   MCHC 30.1 05/10/2018 2120   RDW 17.1 (H) 05/10/2018 2120   LYMPHSABS 1.0 02/20/2018 1536   MONOABS 0.4 02/20/2018 1536   EOSABS 0.0 02/20/2018 1536   BASOSABS 0.1 02/20/2018 1536   Iron/TIBC/Ferritin/ %Sat    Component Value Date/Time   IRON 44 02/20/2018 1536   IRON 81 05/29/2017   TIBC 347 05/29/2017   IRONPCTSAT 8.8 (L) 02/20/2018 1536   Lipid Panel     Component Value Date/Time   CHOL 196 02/27/2018 1233   TRIG 142 02/27/2018 1233   HDL 46 02/27/2018 1233   CHOLHDL 5 09/10/2017 1156   VLDL 26.6 09/10/2017 1156   LDLCALC 122 (H) 02/27/2018 1233   Hepatic Function Panel     Component Value Date/Time   PROT 6.7 03/21/2018 1040   PROT 6.9 02/27/2018 1233   ALBUMIN 4.3 02/27/2018 1233   AST 22 02/27/2018 1233   ALT 27 02/27/2018 1233   ALKPHOS 100 02/27/2018 1233   BILITOT 0.9 02/27/2018 1233      Component Value Date/Time   TSH 1.450 02/27/2018 1233   TSH 0.97 12/11/2017 1158   TSH 1.28 05/29/2017      OBESITY BEHAVIORAL INTERVENTION VISIT  Today's visit was # 5   Starting weight: 357 lbs Starting date: 02/27/18 Today's weight : 348 lbs  Today's date: 05/09/2018 Total lbs lost to date: 9    ASK: We discussed the diagnosis of obesity with Madison Palmer today and Madison Palmer agreed to give [place] permission to discuss obesity behavioral modification therapy today.  ASSESS: Keara has the diagnosis of obesity and her BMI today is 63.63 Mycah is in the action stage of change   ADVISE: O[place] was educated on the multiple health risks of obesity as well as the benefit of weight loss to improve her health. She was advised of the need for long term treatment and the importance of lifestyle  modifications to improve her current health and to decrease her risk of future health problems.  AGREE: Multiple dietary modification options and treatment options were discussed and  Hazelee agreed to follow the recommendations documented in the above note.  ARRANGE: Eda was educated on the importance of frequent visits to treat obesity as outlined per CMS and USPSTF guidelines and agreed to schedule her next follow up appointment today.  I, Madison Palmer, am acting as transcriptionist for Madison Qua, MD  I have reviewed the above documentation for accuracy and completeness, and I agree with the above. - Madison Qua, MD

## 2018-05-11 NOTE — Progress Notes (Signed)
Report received. Discussed concerns with nurse r/t to pt elevated b/p. Nurse reports pt took home b/p meds. Asked nurse if that was before or after obtaining b/p, states it was taken before b/p. Request nurse to recheck pt b/p and for possible interventions to help assist/alleviate pt b/p before transport.

## 2018-05-11 NOTE — H&P (Signed)
History and Physical    Madison Palmer QJF:[id number] DOB: [birth year] DOA: 05/10/2018  PCP: Flossie Buffy, NP  Patient coming from: Home.  Chief Complaint: Shortness of breath.  HPI: Madison Palmer is a 53 y.o. female with history of sleep apnea, hyperparathyroidism, hypothyroidism, difficult to control hypertension, diastolic CHF per 2D echo done November 2019 showed EF of 60 to 65% with grade 1 diastolic dysfunction on metformin for obesity presents to the ER at [facility] with complaints of having hypoxia.  Patient's hypoxia was noticed when patient was about to undergo colonoscopy 4 days ago.  The colonoscopy was canceled.  Since then patient has been checking her oxygen level which has been desaturating quite often.  Denies any chest pain and has not noticed any increased weight or any productive cough fever chills or wheezing.  ED Course: In the ER patient was easily desaturating on exertion.  D-dimer was minimally with a troponin also was minimally elevated.  BNP was 199 EKG shows sinus rhythm.  Blood pressure on arrival was around 203/109.  Patient was given amlodipine 5 mg hydralazine IV and nitroglycerin patch.  Following which blood pressure improved.  CT angiogram of the chest was negative for PE but did show multiple nodules which will need further work-up.  There also was concerning for air trapping.  Given the overall symptoms and concern for CHF patient was given 40 mg IV Lasix and admitted for further work-up.  Review of Systems: As per HPI, rest all negative.   Past Medical History:  Diagnosis Date  . Anemia   . Anxiety   . Arrhythmia    tachycardia  . Arthritis   . Chickenpox   . Depression   . Diverticulitis   . GERD (gastroesophageal reflux disease)   . Glaucoma   . Hyperlipidemia   . Hyperparathyroidism ([place])   . Hypertension   . Inflammatory polyps of colon ([place])   . LVH (left ventricular hypertrophy)   . Lymphedema   . PCOS (polycystic  ovarian syndrome)   . Prediabetes   . Recurrent UTI   . Sleep apnea    CPAP  . Thyroid disease   . Vitamin D deficiency     Past Surgical History:  Procedure Laterality Date  . BREAST BIOPSY  2015  . CESAREAN SECTION  2004  . INNER EAR SURGERY     ear and sinus surgery  . LAPAROSCOPIC REPAIR AND REMOVAL OF GASTRIC BAND    . OOPHORECTOMY Left   . TONSILLECTOMY AND ADENOIDECTOMY       reports that she has never smoked. She has never used smokeless tobacco. She reports current alcohol use. She reports that she does not use drugs.  Allergies  Allergen Reactions  . Fentanyl Hives  . Levofloxacin Hives  . Midazolam Hives  . Pollen Extract     seasonal  . Atorvastatin     Muscle pain in legs   . Rosuvastatin     Abdominal pain    Family History  Adopted: Yes  Problem Relation Age of Onset  . Hearing loss Son        right   . Healthy Son     Prior to Admission medications   Medication Sig Start Date End Date Taking? Authorizing Provider  acetaminophen (TYLENOL) 500 MG tablet Take 1,000 mg by mouth 3 (three) times daily as needed for moderate pain or headache.   Yes [provider]  amLODipine (NORVASC) 2.5 MG tablet Take  1 tablet (2.5 mg total) by mouth daily. 03/12/18  Yes Minus Breeding, MD  atenolol (TENORMIN) 25 MG tablet Take 25 mg by mouth every evening.  02/16/17  Yes [provider]  ergocalciferol (VITAMIN D2) 1.25 MG (50000 UT) capsule Take 1 capsule (50,000 Units total) by mouth 3 (three) times a week. 04/03/18  Yes Eber Jones, MD  esomeprazole (NEXIUM) 40 MG capsule TAKE ONE CAPSULE BY MOUTH TWICE DAILY 04/11/18  Yes Mansouraty, Telford Nab., MD  fluticasone [facility]) 50 MCG/ACT nasal spray Place 2 sprays into both nostrils daily.   Yes [provider]  furosemide (LASIX) 40 MG tablet Take 1 tablet (40 mg total) by mouth daily. 05/10/18  Yes [name], Charlene Brooke, NP  hydrALAZINE (APRESOLINE) 50 MG tablet Take 1tab in morning  and afternoon, and 2tabs at bedtime 12/11/17  Yes [name], Charlene Brooke, NP  hydrochlorothiazide (HYDRODIURIL) 50 MG tablet Take 50 mg by mouth daily.   Yes [provider]  ibuprofen (ADVIL,MOTRIN) 200 MG tablet Take 400 mg by mouth daily as needed for headache or moderate pain.   Yes [provider]  levothyroxine (SYNTHROID, LEVOTHROID) 75 MCG tablet TK 1 T PO QD  MONDAY-FRIDAY 01/25/18  Yes Renato Shin, MD  polyethylene glycol-electrolytes (NULYTELY/GOLYTELY) 420 g solution Take 4,000 mLs by mouth as directed. 03/25/18  Yes Mansouraty, Telford Nab., MD  benzonatate (TESSALON) 100 MG capsule Take 1 capsule (100 mg total) by mouth 3 (three) times daily as needed for cough. 04/11/18   [name], Charlene Brooke, NP  cefixime (SUPRAX) 400 MG CAPS capsule Take 1 capsule (400 mg total) by mouth daily. 04/11/18   [name], Charlene Brooke, NP  cetirizine (ZYRTEC ALLERGY) 10 MG tablet Take 1 tablet (10 mg total) by mouth daily. Patient taking differently: Take 10 mg by mouth daily as needed for allergies.  03/13/18   [name], Charlene Brooke, NP  guaiFENesin-dextromethorphan (ROBITUSSIN DM) 100-10 MG/5ML syrup Take 10 mLs by mouth every 8 (eight) hours as needed for cough. 04/11/18   [name], Charlene Brooke, NP  metFORMIN (GLUCOPHAGE) 500 MG tablet Take 1 tablet (500 mg total) by mouth daily with breakfast. 03/14/18 05/07/18  Eber Jones, MD  omega-3 fish oil (MAXEPA) 1000 MG CAPS capsule Take 2 capsules (2,000 mg total) by mouth 2 (two) times daily. Patient not taking: Reported on 05/10/2018 09/12/17   [name], Charlene Brooke, NP  potassium chloride SA (K-DUR,KLOR-CON) 20 MEQ tablet Take 1 tablet (20 mEq total) by mouth daily. 05/10/18   [name], Charlene Brooke, NP  Vitamin D, Ergocalciferol, (DRISDOL) 1.25 MG (50000 UT) CAPS capsule Take 1 capsule (50,000 Units total) by mouth every 7 (seven) days. 05/09/18   Eber Jones, MD    Physical Exam: Vitals:   05/11/18 0133 05/11/18 0136 05/11/18 0253  05/11/18 0329  BP: (!) 175/101 (!) 167/99 (!) 161/92   Pulse: 79  70 70  Resp:    (!) 25  Temp: 98.2 F (36.8 C)     TempSrc: Oral     SpO2: 93%   95%  Weight: (!) 155.8 kg     Height: 5\' 2"  (1.575 m)         Constitutional: Moderately built and nourished. Vitals:   05/11/18 0133 05/11/18 0136 05/11/18 0253 05/11/18 0329  BP: (!) 175/101 (!) 167/99 (!) 161/92   Pulse: 79  70 70  Resp:    (!) 25  Temp: 98.2 F (36.8 C)     TempSrc: Oral     SpO2: 93%  95%  Weight: (!) 155.8 kg     Height: 5\' 2"  (1.575 m)      Eyes: Anicteric no pallor. ENMT: No discharge from the ears eyes nose or mouth. Neck: No mass felt.  No neck rigidity no JVD appreciated. Respiratory: No rhonchi or crepitations. Cardiovascular: S1-S2 heard. Abdomen: Soft nontender bowel sounds present. Musculoskeletal: Lymphedema. Skin: No rash. Neurologic: Alert awake oriented to time place and person.  Moves all extremities. Psychiatric: Appears normal.  Normal affect.   Labs on Admission: I have personally reviewed following labs and imaging studies  CBC: Recent Labs  Lab 05/10/18 2120  WBC 7.6  HGB 14.4  HCT 47.8*  MCV 84.9  PLT 371   Basic Metabolic Panel: Recent Labs  Lab 05/10/18 2120  NA 139  K 3.0*  CL 97*  CO2 35*  GLUCOSE 117*  BUN 22*  CREATININE 0.94  CALCIUM 9.8   GFR: Estimated Creatinine Clearance: 102.1 mL/min (by C-G formula based on SCr of 0.94 mg/dL). Liver Function Tests: No results for input(s): AST, ALT, ALKPHOS, BILITOT, PROT, ALBUMIN in the last 168 hours. No results for input(s): LIPASE, AMYLASE in the last 168 hours. No results for input(s): AMMONIA in the last 168 hours. Coagulation Profile: No results for input(s): INR, PROTIME in the last 168 hours. Cardiac Enzymes: Recent Labs  Lab 05/10/18 2120  TROPONINI 0.04*   BNP (last 3 results) Recent Labs    02/20/18 1536  PROBNP 173.0*   HbA1C: No results for input(s): HGBA1C in the last 72  hours. CBG: Recent Labs  Lab 05/08/18 0934  GLUCAP 97   Lipid Profile: No results for input(s): CHOL, HDL, LDLCALC, TRIG, CHOLHDL, LDLDIRECT in the last 72 hours. Thyroid Function Tests: No results for input(s): TSH, T4TOTAL, FREET4, T3FREE, THYROIDAB in the last 72 hours. Anemia Panel: No results for input(s): VITAMINB12, FOLATE, FERRITIN, TIBC, IRON, RETICCTPCT in the last 72 hours. Urine analysis:    Component Value Date/Time   BILIRUBINUR 1+ 12/24/2017 1117   PROTEINUR Positive (A) 12/24/2017 1117   UROBILINOGEN 0.2 12/24/2017 1117   NITRITE negative 12/24/2017 1117   LEUKOCYTESUR Negative 12/24/2017 1117   Sepsis Labs: @LABRCNTIP (procalcitonin:4,lacticidven:4) )No results found for this or any previous visit (from the past 240 hour(s)).   Radiological Exams on Admission: Dg Chest 2 View  Result Date: 05/10/2018 CLINICAL DATA:  Arrhythmia. Hypoxia. EXAM: CHEST - 2 VIEW COMPARISON:  None. FINDINGS: There is cardiomegaly with pulmonary vascular congestion. No discrete effusions. No acute bone abnormality. IMPRESSION: Cardiomegaly with pulmonary vascular congestion consistent with mild congestive heart failure. Electronically Signed   By: Lorriane Shire M.D.   On: 05/10/2018 15:42   Ct Angio Chest Pe W And/or Wo Contrast  Result Date: 05/10/2018 CLINICAL DATA:  Hypoxia room air. History of hypertension, polycystic ovarian syndrome left ventricular hypertrophy and GERD. History of breast biopsy in 2015 and left oophorectomy. EXAM: CT ANGIOGRAPHY CHEST WITH CONTRAST TECHNIQUE: Multidetector CT imaging of the chest was performed using the standard protocol during bolus administration of intravenous contrast. Multiplanar CT image reconstructions and MIPs were obtained to evaluate the vascular anatomy. CONTRAST:  148mL ISOVUE-370 IOPAMIDOL (ISOVUE-370) INJECTION 76% COMPARISON:  CXR 05/10/2018 FINDINGS: Cardiovascular: Cardiomegaly without pericardial effusion or thickening.  Nonaneurysmal thoracic aorta without dissection. Aortic atherosclerosis at the arch. No large central pulmonary embolus to the segmental level. Satisfactory opacification of the pulmonary arteries is noted to the segmental level. Mediastinum/Nodes: Nonpathologic sized mediastinal lymph nodes, largest is lower paratracheal measuring up to 9 mm short  axis. No hilar nor axillary lymphadenopathy. Lungs/Pleura: Too numerous to count bilateral pulmonary nodules, the largest measuring 15 mm in the right lower lobe with next largest in the right middle lobe measuring 9 mm. Largest in the left lung is medially within the lingula at 13 mm. Patchy mosaic attenuation of the lungs, nonspecific but may represent areas of interspersed air trapping, alveolitis/pneumonitis and/or hypoventilatory change. Upper Abdomen: No enhancing mass of the included liver. The adrenal glands are excluded on this study. Musculoskeletal: No chest wall abnormality. No acute or significant osseous findings. Osteophytes along the mid to lower thoracic spine with degenerative disc disease consistent thoracic spondylosis. Review of the MIP images confirms the above findings. IMPRESSION: 1. No acute pulmonary embolus. 2. There are scattered too numerous to count bilateral pulmonary nodules, the largest is 15 mm in the right lower lobe and in the left lung, 13 mm within the lingula. Although findings could be postinfectious or inflammatory in etiology, the possibility of pulmonary metastasis is of concern. Non-contrast chest CT at 3-6 months is recommended. If the nodules are stable at time of repeat CT, then future CT at 18-24 months (from today's scan) is considered optional for low-risk patients, but is recommended for high-risk patients. This recommendation follows the consensus statement: Guidelines for Management of Incidental Pulmonary Nodules Detected on CT Images: From the Fleischner Society 2017; Radiology 2017; 284:228-243. Can also consider  PET-CT or potentially percutaneous sampling of accessible nodules as clinically warranted. 3. Patchy mosaic attenuation of the lungs, nonspecific but may represent areas of interspersed air trapping, alveolitis/pneumonitis and/or hypoventilatory change. 4. Cardiomegaly. Aortic Atherosclerosis (ICD10-I70.0). Electronically Signed   By: Ashley Royalty M.D.   On: 05/10/2018 22:44    EKG: Independently reviewed.  Normal sinus rhythm with APCs.  Assessment/Plan Principal Problem:   Acute respiratory failure with hypoxia (HCC) Active Problems:   Hypothyroidism   OSA on CPAP   Lymphatic edema   Hyperparathyroidism ([place])   Pulmonary nodules   Hypertensive urgency    1. Acute respiratory failure with hypoxia suspect possibly secondary to CHF with 2D echo done in November 2019 showed EF of 60 to 65% with grade 1 diastolic dysfunction.  Patient is given Lasix 40 mg IV and I have placed patient on 40 IV daily for now.  Based on response patient probably will need further doses.  Patient states she was prescribed Lasix but she did not take it yet.  In addition patient's uncontrolled hypertension probably causing patient's symptoms. 2. Hypertensive urgency likely contributing to #1.  And patient in addition to her amlodipine, hydrochlorothiazide hydralazine atenolol has been placed on PRN IV hydralazine.  Patient is also on Lasix.  Nitroglycerin patch.  Probably may have to go up on her amlodipine.  Patient has earlier problems with edema when her amlodipine was higher and higher dose. 3. Elevated troponin likely from CHF.  Denies any chest pain we will cycle cardiac markers. 4. Sleep apnea on CPAP. 5. Chronic lymphedema. 6. Hypothyroidism on Synthroid. 7. Pulmonary nodules will need further work-up as outpatient. 8. Hyperparathyroidism being scheduled for surgery and last part of February next month. 9. Morbid obesity.  On metformin. 10. Mild hypokalemia replace recheck.  Check magnesium. 11. 2D echo done  in November 2019 showed pulmonary pressure of 34.   DVT prophylaxis: Lovenox. Code Status: Full code. Family Communication: Discussed with patient. Disposition Plan: Home. Consults called: None. Admission status: Observation.   Rise Patience MD [facility] Pager [phone].  If 7PM-7AM, please  contact night-coverage [email or web address] Password [facility] [place]  05/11/2018, 6:16 AM

## 2018-05-12 DIAGNOSIS — E039 Hypothyroidism, unspecified: Secondary | ICD-10-CM | POA: Diagnosis present

## 2018-05-12 DIAGNOSIS — R918 Other nonspecific abnormal finding of lung field: Secondary | ICD-10-CM

## 2018-05-12 DIAGNOSIS — E785 Hyperlipidemia, unspecified: Secondary | ICD-10-CM | POA: Diagnosis present

## 2018-05-12 DIAGNOSIS — I890 Lymphedema, not elsewhere classified: Secondary | ICD-10-CM | POA: Diagnosis present

## 2018-05-12 DIAGNOSIS — I509 Heart failure, unspecified: Secondary | ICD-10-CM | POA: Diagnosis not present

## 2018-05-12 DIAGNOSIS — I5033 Acute on chronic diastolic (congestive) heart failure: Secondary | ICD-10-CM | POA: Diagnosis present

## 2018-05-12 DIAGNOSIS — Z6841 Body Mass Index (BMI) 40.0 and over, adult: Secondary | ICD-10-CM | POA: Diagnosis not present

## 2018-05-12 DIAGNOSIS — K219 Gastro-esophageal reflux disease without esophagitis: Secondary | ICD-10-CM | POA: Diagnosis present

## 2018-05-12 DIAGNOSIS — M1990 Unspecified osteoarthritis, unspecified site: Secondary | ICD-10-CM | POA: Diagnosis present

## 2018-05-12 DIAGNOSIS — G4733 Obstructive sleep apnea (adult) (pediatric): Secondary | ICD-10-CM

## 2018-05-12 DIAGNOSIS — H409 Unspecified glaucoma: Secondary | ICD-10-CM | POA: Diagnosis present

## 2018-05-12 DIAGNOSIS — I878 Other specified disorders of veins: Secondary | ICD-10-CM | POA: Diagnosis present

## 2018-05-12 DIAGNOSIS — Z9989 Dependence on other enabling machines and devices: Secondary | ICD-10-CM

## 2018-05-12 DIAGNOSIS — I160 Hypertensive urgency: Secondary | ICD-10-CM | POA: Diagnosis present

## 2018-05-12 DIAGNOSIS — R7303 Prediabetes: Secondary | ICD-10-CM | POA: Diagnosis present

## 2018-05-12 DIAGNOSIS — J9601 Acute respiratory failure with hypoxia: Secondary | ICD-10-CM | POA: Diagnosis not present

## 2018-05-12 DIAGNOSIS — E876 Hypokalemia: Secondary | ICD-10-CM | POA: Diagnosis present

## 2018-05-12 DIAGNOSIS — R0602 Shortness of breath: Secondary | ICD-10-CM | POA: Diagnosis present

## 2018-05-12 DIAGNOSIS — R768 Other specified abnormal immunological findings in serum: Secondary | ICD-10-CM | POA: Diagnosis present

## 2018-05-12 DIAGNOSIS — I493 Ventricular premature depolarization: Secondary | ICD-10-CM | POA: Diagnosis present

## 2018-05-12 DIAGNOSIS — I272 Pulmonary hypertension, unspecified: Secondary | ICD-10-CM | POA: Diagnosis present

## 2018-05-12 DIAGNOSIS — I110 Hypertensive heart disease with heart failure: Secondary | ICD-10-CM | POA: Diagnosis present

## 2018-05-12 DIAGNOSIS — E213 Hyperparathyroidism, unspecified: Secondary | ICD-10-CM

## 2018-05-12 DIAGNOSIS — E559 Vitamin D deficiency, unspecified: Secondary | ICD-10-CM | POA: Diagnosis present

## 2018-05-12 DIAGNOSIS — Z9884 Bariatric surgery status: Secondary | ICD-10-CM | POA: Diagnosis not present

## 2018-05-12 DIAGNOSIS — E209 Hypoparathyroidism, unspecified: Secondary | ICD-10-CM | POA: Diagnosis not present

## 2018-05-12 DIAGNOSIS — J9621 Acute and chronic respiratory failure with hypoxia: Secondary | ICD-10-CM | POA: Diagnosis present

## 2018-05-12 LAB — BASIC METABOLIC PANEL
Anion gap: 6 (ref 5–15)
BUN: 16 mg/dL (ref 6–20)
CO2: 39 mmol/L — ABNORMAL HIGH (ref 22–32)
Calcium: 9.3 mg/dL (ref 8.9–10.3)
Chloride: 94 mmol/L — ABNORMAL LOW (ref 98–111)
Creatinine, Ser: 1.03 mg/dL — ABNORMAL HIGH (ref 0.44–1.00)
GFR calc Af Amer: >60 mL/min (ref >60)
GFR calc non Af Amer: >60 mL/min (ref >60)
Glucose, Bld: 97 mg/dL (ref 70–99)
Potassium: 3.2 mmol/L — ABNORMAL LOW (ref 3.5–5.1)
Sodium: 139 mmol/L (ref 135–145)

## 2018-05-12 LAB — MAGNESIUM: Magnesium: 2.3 mg/dL (ref 1.7–2.4)

## 2018-05-12 LAB — SEDIMENTATION RATE: Sed Rate: 2 mm/hr (ref 0–22)

## 2018-05-12 MED ORDER — POTASSIUM CHLORIDE CRYS ER 20 MEQ PO TBCR
40.0000 meq | EXTENDED_RELEASE_TABLET | Freq: Once | ORAL | Status: AC
  Administered 2018-05-12: 40 meq via ORAL
  Filled 2018-05-12: qty 2

## 2018-05-12 MED ORDER — HYDROCHLOROTHIAZIDE 25 MG PO TABS
50.0000 mg | ORAL_TABLET | Freq: Every day | ORAL | Status: DC
  Administered 2018-05-13: 50 mg via ORAL
  Filled 2018-05-12: qty 2

## 2018-05-12 MED ORDER — FUROSEMIDE 40 MG PO TABS
40.0000 mg | ORAL_TABLET | Freq: Every day | ORAL | Status: DC
  Administered 2018-05-12: 40 mg via ORAL
  Filled 2018-05-12: qty 1

## 2018-05-12 NOTE — Progress Notes (Signed)
Tele report at this time pt went into a junctional rhythm at approximately 9 pm and came out at 12 am.

## 2018-05-12 NOTE — Consult Note (Signed)
NAME:  Madison Palmer, MRN:  [id number], DOB:  Oct 10, 1965, LOS: 0 ADMISSION DATE:  05/10/2018, CONSULTATION DATE:  1/12 REFERRING MD:  Tamala Julian, CHIEF COMPLAINT: hypoxia and pulmonary nodules    Brief History   53 year old female patient who was initially admitted on 1/10 with chief complaint of asymptomatic hypoxia (identified on pulse oximetry), lower extremity edema and weight gain.  Admitted for volume overload and hypertensive urgency with blood pressure initially 200/100.  CT chest obtained during evaluation was negative for pulmonary emboli however Numerous pulmonary nodules as well as patchy bilateral airspace disease because of this pulmonary was asked to evaluate.  History of present illness   This is a 53 year old female patient with a history as mentioned below.  Presented on 1/10 with chief complaint of asymptomatic hypoxia.  She apparently had been planning on having a  colonoscopy, 4 days prior to admission.  On evaluation she was found to be hypoxic and therefore colonoscopy was discontinued.  She had pulse oximetry at home, so periodically checked her oxygen levels and noted her saturations were frequently dropping.  She also notes her oxygen levels have been borderline for about the last 3 months, and swelling and weight gain has been worse over the last 4 months.  She denied chest pain,, cough fever or chills.  She did report increased lower extremity edema and weight gain.  No sick exposures.  In the emergency room she was found to be hypoxic, her BNP was only mildly elevated at 199 her d-dimer and troponin was mildly elevated as well.  She was found to be extremely hypertensive with blood pressure 203/109, and she was admitted for further evaluation of her hypoxia as well as to help treat poorly controlled hypertension.  Given her mildly elevated d-dimer a CT angiogram was obtained this was negative for pulmonary emboli but did identify multiple pulmonary nodules.  From a therapeutic  standpoint she was treated primarily for hypertensive urgency and volume overload.  This included IV Lasix, adjustment of her antihypertensive regiment,And then cardiology consultation on 1/11.  Cardiology felt that she may have a degree of diastolic dysfunction and volume overload, however also felt she may have some underlying pulmonary hypertension.  Additionally they were concerned about the innumerable pulmonary nodules and pulmonary consultation to assist with the evaluation of hypoxia as well as abnormal CT findings.   Past Medical History  OSA, hypothyroidism, hyperparathyroidism, diastolic dysfxn (grade I), difficult to control HTN Significant Hospital Events   1/10: Admitted with hypoxia, and hypertensive urgency  Consults:  Cardiology consulted 1/11 Pulmonary consulted 1/11  Procedures:    Significant Diagnostic Tests:  CT chest 1/10: Negative for pulmonary emboli.  Too numerous to count bilateral pulmonary nodules with patchy mosaic attenuated changes in the lung  Micro Data:    Antimicrobials:    Interim history/subjective:  Feels about the same  Objective   Blood pressure (Abnormal) 175/86, pulse 66, temperature 98 F (36.7 C), temperature source Oral, resp. rate 20, height 5\' 2"  (1.575 m), weight (Abnormal) 155 kg, SpO2 100 %.        Intake/Output Summary (Last 24 hours) at 05/12/2018 1302 Last data filed at 05/12/2018 1010 Gross per 24 hour  Intake 540 ml  Output 200 ml  Net 340 ml   Filed Weights   05/11/18 0133 05/11/18 0656 05/12/18 0602  Weight: (Abnormal) 155.8 kg (Abnormal) 155.4 kg (Abnormal) 155 kg    Examination: General: Pleasant 53 year old female resting in bed she is in no acute distress  HENT: Normocephalic atraumatic no jugular venous distention mucous membranes moist Lungs: Diminished bases no accessory use Cardiovascular: Regular rate and rhythm Abdomen: Soft nontender no organomegaly Extremities: Chronic venous stasis  changes/lymphedema warm dry refill brisk Neuro: Awake oriented no focal deficits GU: Voids  Resolved Hospital Problem list     Assessment & Plan:  Chronic hypoxic respiratory failure OSA followed by Elsworth Soho Obesity w/ prior lap band Pulmonary edema Diffuse pulmonary nodules Hypertension Diastolic heart dysfunction Fluid and electrolyte imbalance: Hypokalemia History of low titer positive ANA with autoimmune work-up negative with the exception of  positive rheumatoid factor.  Discussion Subacute hypoxic respiratory failure.  Suspect at least some element of volume excess but agree with cardiology it is unlikely that this is the primary cause of her hypoxia.  She has had a history of some abnormal autoimmune studies such as a positive ANA and rheumatoid factor.  I wonder about diagnoses such as sarcoidosis or perhaps rheumatoid involvement of the lung  Plan Check repeat rheumatoid factor, repeat sed rate, and check ACE level Will defer to Dr. Elsworth Soho who will evaluate CT imaging, I suspect she will need repeat PET scan and possibly tissue sampling for definitive diagnosis From an inpatient standpoint we should optimize her volume status, and identify her oxygen needs.  Most likely she will be discharged home on oxygen with some of her work-up being done in the outpatient setting   Labs   CBC: Recent Labs  Lab 05/10/18 2120 05/11/18 0630  WBC 7.6 6.2  NEUTROABS  --  4.6  HGB 14.4 13.3  HCT 47.8* 43.8  MCV 84.9 83.4  PLT 252 086    Basic Metabolic Panel: Recent Labs  Lab 05/10/18 2120 05/11/18 0630 05/12/18 0218  NA 139 142 139  K 3.0* 2.8* 3.2*  CL 97* 95* 94*  CO2 35* 36* 39*  GLUCOSE 117* 103* 97  BUN 22* 14 16  CREATININE 0.94 0.88 1.03*  CALCIUM 9.8 9.5 9.3  MG  --  1.8 2.3   GFR: Estimated Creatinine Clearance: 92.9 mL/min (A) (by C-G formula based on SCr of 1.03 mg/dL (H)). Recent Labs  Lab 05/10/18 2120 05/11/18 0630  WBC 7.6 6.2    Liver Function  Tests: Recent Labs  Lab 05/11/18 0630  AST 25  ALT 39  ALKPHOS 71  BILITOT 1.6*  PROT 6.1*  ALBUMIN 3.1*   No results for input(s): LIPASE, AMYLASE in the last 168 hours. No results for input(s): AMMONIA in the last 168 hours.  ABG No results found for: PHART, PCO2ART, PO2ART, HCO3, TCO2, ACIDBASEDEF, O2SAT   Coagulation Profile: No results for input(s): INR, PROTIME in the last 168 hours.  Cardiac Enzymes: Recent Labs  Lab 05/10/18 2120 05/11/18 0630 05/11/18 1255 05/11/18 1837  TROPONINI 0.04* <0.03 <0.03 <0.03    HbA1C: Hemoglobin A1C  Date/Time Value Ref Range Status  05/29/2017 5.9  Final   Hgb A1c MFr Bld  Date/Time Value Ref Range Status  02/27/2018 12:33 PM 6.0 (H) 4.8 - 5.6 % Final    Comment:             Prediabetes: 5.7 - 6.4          Diabetes: >6.4          Glycemic control for adults with diabetes: <7.0   09/10/2017 11:56 AM 5.7 4.6 - 6.5 % Final    Comment:    Glycemic Control Guidelines for People with Diabetes:Non Diabetic:  <6%Goal of Therapy: <7%Additional Action Suggested:  >8%  CBG: Recent Labs  Lab 05/08/18 0934  GLUCAP 97    Review of Systems:   Review of Systems  Constitutional: Positive for malaise/fatigue.  HENT: Negative.   Eyes: Negative.   Respiratory: Positive for cough. Negative for sputum production, shortness of breath and wheezing.   Cardiovascular: Positive for orthopnea and leg swelling. Negative for chest pain.  Gastrointestinal: Positive for heartburn.  Genitourinary: Negative.   Musculoskeletal: Positive for joint pain.  Skin: Negative.   Neurological: Negative.   Endo/Heme/Allergies: Negative.   Psychiatric/Behavioral: Negative.      Past Medical History  She,  has a past medical history of Anemia, Anxiety, Arrhythmia, Arthritis, Chickenpox, Depression, Diverticulitis, GERD (gastroesophageal reflux disease), Glaucoma, Hyperlipidemia, Hyperparathyroidism ([place]), Hypertension, Inflammatory polyps of colon  ([place]), LVH (left ventricular hypertrophy), Lymphedema, PCOS (polycystic ovarian syndrome), Prediabetes, Recurrent UTI, Sleep apnea, Thyroid disease, and Vitamin D deficiency.   Surgical History    Past Surgical History:  Procedure Laterality Date  . BREAST BIOPSY  2015  . CESAREAN SECTION  2004  . INNER EAR SURGERY     ear and sinus surgery  . LAPAROSCOPIC REPAIR AND REMOVAL OF GASTRIC BAND    . OOPHORECTOMY Left   . TONSILLECTOMY AND ADENOIDECTOMY       Social History   reports that she has never smoked. She has never used smokeless tobacco. She reports current alcohol use. She reports that she does not use drugs.   Family History   Her family history includes Healthy in her son; Hearing loss in her son. She was adopted.   Allergies Allergies  Allergen Reactions  . Fentanyl Hives  . Levofloxacin Hives  . Midazolam Hives  . Pollen Extract     seasonal  . Atorvastatin     Muscle pain in legs   . Rosuvastatin     Abdominal pain     Home Medications  Prior to Admission medications   Medication Sig Start Date End Date Taking? Authorizing Provider  acetaminophen (TYLENOL) 500 MG tablet Take 1,000 mg by mouth 3 (three) times daily as needed for moderate pain or headache.   Yes [provider]  amLODipine (NORVASC) 2.5 MG tablet Take 1 tablet (2.5 mg total) by mouth daily. 03/12/18  Yes Minus Breeding, MD  atenolol (TENORMIN) 25 MG tablet Take 25 mg by mouth every evening.  02/16/17  Yes [provider]  ergocalciferol (VITAMIN D2) 1.25 MG (50000 UT) capsule Take 1 capsule (50,000 Units total) by mouth 3 (three) times a week. 04/03/18  Yes Eber Jones, MD  esomeprazole (NEXIUM) 40 MG capsule TAKE ONE CAPSULE BY MOUTH TWICE DAILY 04/11/18  Yes Mansouraty, Telford Nab., MD  fluticasone [facility]) 50 MCG/ACT nasal spray Place 2 sprays into both nostrils daily.   Yes [provider]  furosemide (LASIX) 40 MG tablet Take 1 tablet (40 mg total) by  mouth daily. 05/10/18  Yes [name], Charlene Brooke, NP  hydrALAZINE (APRESOLINE) 50 MG tablet Take 1tab in morning and afternoon, and 2tabs at bedtime 12/11/17  Yes [name], Charlene Brooke, NP  hydrochlorothiazide (HYDRODIURIL) 50 MG tablet Take 50 mg by mouth daily.   Yes [provider]  ibuprofen (ADVIL,MOTRIN) 200 MG tablet Take 400 mg by mouth daily as needed for headache or moderate pain.   Yes [provider]  levothyroxine (SYNTHROID, LEVOTHROID) 75 MCG tablet TK 1 T PO QD  MONDAY-FRIDAY 01/25/18  Yes Renato Shin, MD  polyethylene glycol-electrolytes (NULYTELY/GOLYTELY) 420 g solution Take 4,000 mLs by mouth as directed. 03/25/18  Yes Mansouraty, Telford Nab., MD  benzonatate (TESSALON) 100 MG capsule Take 1 capsule (100 mg total) by mouth 3 (three) times daily as needed for cough. 04/11/18   [name], Charlene Brooke, NP  cefixime (SUPRAX) 400 MG CAPS capsule Take 1 capsule (400 mg total) by mouth daily. 04/11/18   [name], Charlene Brooke, NP  cetirizine (ZYRTEC ALLERGY) 10 MG tablet Take 1 tablet (10 mg total) by mouth daily. Patient taking differently: Take 10 mg by mouth daily as needed for allergies.  03/13/18   [name], Charlene Brooke, NP  guaiFENesin-dextromethorphan (ROBITUSSIN DM) 100-10 MG/5ML syrup Take 10 mLs by mouth every 8 (eight) hours as needed for cough. 04/11/18   [name], Charlene Brooke, NP  metFORMIN (GLUCOPHAGE) 500 MG tablet Take 1 tablet (500 mg total) by mouth daily with breakfast. 03/14/18 05/07/18  Eber Jones, MD  omega-3 fish oil (MAXEPA) 1000 MG CAPS capsule Take 2 capsules (2,000 mg total) by mouth 2 (two) times daily. Patient not taking: Reported on 05/10/2018 09/12/17   [name], Charlene Brooke, NP  potassium chloride SA (K-DUR,KLOR-CON) 20 MEQ tablet Take 1 tablet (20 mEq total) by mouth daily. 05/10/18   [name], Charlene Brooke, NP  Vitamin D, Ergocalciferol, (DRISDOL) 1.25 MG (50000 UT) CAPS capsule Take 1 capsule (50,000 Units total) by mouth every 7 (seven) days. 05/09/18    Eber Jones, MD     Erick Colace ACNP-BC [place] Pager # [phone] OR # [phone] if no answer

## 2018-05-12 NOTE — Progress Notes (Signed)
Patient asked that RN review MD note for today's plan. We reviewed internal med and cards notes. She states she is not agreeable to increasing her Amlodipine as she believes it increases her lymphedema swelling. She reports that she has stopped diuresing as much with her Lasix doses. She states she only had to urinate one time during the night last night (283ml documented at 0600). She asked about keeping track of her fluid restriction and we totaled her morning PO intake together at 523ml. She is currently drinking a coffee and understands that she is almost at half of her fluid goal for the day. She is concerned about becoming dehydrated. She understands we are following her electrolytes and is familiar with normal limits. Patient would like to speak to MD about trying a different medication to manage blood pressure or making a different adjustment to her regimen, rather than increasing Amlodipine.

## 2018-05-12 NOTE — Progress Notes (Addendum)
Progress Note    Madison VANVOORHIS  GDJ:[id number] DOB: [birth year]  DOA: 05/10/2018 PCP: Flossie Buffy, NP    Brief Narrative:   Chief complaint: Shortness of breath  Medical records reviewed and are as summarized below:  THERMA LASURE is an 53 y.o. female past medical history significant for hyperparathyroidism, hypothyroidism, OSA on CPAP, and diastolic CHF last EF noted to be 60 to 65% with grade 1 diastolic dysfunction in 6222; who presents with shortness of breath and hypoxic on room air.  Assessment/Plan:   Principal Problem:   Acute respiratory failure with hypoxia (HCC) Active Problems:   Hypothyroidism   OSA on CPAP   Lymphatic edema   Hyperparathyroidism ([place])   Pulmonary nodules   Hypertensive urgency  Hypoxic respiratory 20: D-dimer was elevated 0.64. CT angiogram was negative for any signs of pulmonary embolus but did note innumerable pulmonary nodules. Patient still remains on at least 2 L nasal cannula oxygen at this time.   - Continue pulse oximetry with nasal cannula oxygen  - Wean as tolerated to room air  Pulmonary nodules: Patient with innumerable pulmonary nodules of unclear etiology.  Patient notes previous history positive ANA and RA, but was advised she did not have rheumatoid arthritis after being evaluated by rheumatology.  She also reports previous history of PET scan also showed nodules 2-3 years ago. - Consult pulmonary, for further recommendations and evaluation of hypoxia and pulmonary nodules - Follow-up ACE, RA, ESR  Chronic diastolic congestive heart failure: Patient symptoms initially gave concern for heart failure.  Last EF noted to be 03/2018 with EF of 60 to 65% with grade 1 diastolic dysfunction mild pulmonary hypertension.  Patient had initially been started on 40 mg of Lasix IV twice daily however after initial day patient had significant decrease in urine output.  Lab work from this a.m. showed slight bump in creatinine.   Cardiology felt symptoms less likely related with heart failure. - Strict intake and output  - Daily weights - Stopped Lasix and restart back patient's home oral medication of hydrochlorothiazide as she never reported taking Lasix. - Check BMP in a.m. - Consider adding ACE-I/ARB recommended if kidney function stable - Appreciate cardiology consultative services  Elevated cardiac troponin: Now resolved.  Initial troponin 0.04->0.03.  Suspect symptoms likely related demand.  Was set to undergo stress testing with Dr. Percival Spanish whom she follows with an outpatient setting this coming week.  Hypothyroidism and hypoparathyroidism: TSH 0.938 - Continue thyroxine - Continue outpatient follow-up  Chronic lymphedema - Nursing to wrap the legs  Essential hypertension  OSA on CPAP - Continue CPAP per pharmacy  Body mass index is 62.5 kg/m.   Family Communication/Anticipated D/C date and plan/Code Status   DVT prophylaxis: Lovenox ordered. Code Status: Full Code.  Family Communication: No family present at bedside Disposition Plan: Discharge home once medically stable   Medical Consultants:    Cardiology, pulmonology   Anti-Infectives:    None  Subjective:   Patient reports that he only diuretic she was on was hydrochlorothiazide.  Her primary care provider had recently prescribed her Lasix but she had not picked up the prescription from the pharmacy.  She has not urinated as much as she did when she was first given the Lasix.  She has had follow-up appointment with Dr. Percival Spanish to undergo stress testing on January 15.  Objective:    Vitals:   05/11/18 2024 05/11/18 2026 05/11/18 2310 05/12/18 0602  BP: (!) 145/85 (!) 148/73  121/78   Pulse:   (!) 105   Resp:   20   Temp:   98.1 F (36.7 C)   TempSrc:   Oral   SpO2:   94%   Weight:    (!) 155 kg  Height:        Intake/Output Summary (Last 24 hours) at 05/12/2018 4008 Last data filed at 05/12/2018 0604 Gross per 24  hour  Intake -  Output 200 ml  Net -200 ml   Filed Weights   05/11/18 0133 05/11/18 0656 05/12/18 0602  Weight: (!) 155.8 kg (!) 155.4 kg (!) 155 kg    Exam: Constitutional: Female in NAD, calm, comfortable Eyes: PERRL, lids and conjunctivae normal ENMT: Mucous membranes are moist. Posterior pharynx clear of any exudate or lesions.  Neck: normal, supple, no masses, no thyromegaly Respiratory: Decreased aeration, but no wheezes or rhonchi appreciated Cardiovascular: Regular rate and rhythm, no murmurs / rubs / gallops.  Significant bilateral lower extremity nonpitting edema. 2+ pedal pulses. No carotid bruits.  Abdomen: no tenderness, no masses palpated. No hepatosplenomegaly. Bowel sounds positive.  Musculoskeletal: no clubbing / cyanosis. No joint deformity upper and lower extremities. Good ROM, no contractures. Normal muscle tone.  Skin: no rashes, lesions, ulcers. No induration Neurologic: CN 2-12 grossly intact. Sensation intact, DTR normal. Strength 5/5 in all 4.  Psychiatric: Normal judgment and insight. Alert and oriented x 3. Normal mood.    Data Reviewed:   I have personally reviewed following labs and imaging studies:  Labs: Labs show the following:   Basic Metabolic Panel: Recent Labs  Lab 05/10/18 2120 05/11/18 0630 05/12/18 0218  NA 139 142 139  K 3.0* 2.8* 3.2*  CL 97* 95* 94*  CO2 35* 36* 39*  GLUCOSE 117* 103* 97  BUN 22* 14 16  CREATININE 0.94 0.88 1.03*  CALCIUM 9.8 9.5 9.3  MG  --  1.8 2.3   GFR Estimated Creatinine Clearance: 92.9 mL/min (A) (by C-G formula based on SCr of 1.03 mg/dL (H)). Liver Function Tests: Recent Labs  Lab 05/11/18 0630  AST 25  ALT 39  ALKPHOS 71  BILITOT 1.6*  PROT 6.1*  ALBUMIN 3.1*   No results for input(s): LIPASE, AMYLASE in the last 168 hours. No results for input(s): AMMONIA in the last 168 hours. Coagulation profile No results for input(s): INR, PROTIME in the last 168 hours.  CBC: Recent Labs  Lab  05/10/18 2120 05/11/18 0630  WBC 7.6 6.2  NEUTROABS  --  4.6  HGB 14.4 13.3  HCT 47.8* 43.8  MCV 84.9 83.4  PLT 252 245   Cardiac Enzymes: Recent Labs  Lab 05/10/18 2120 05/11/18 0630 05/11/18 1255 05/11/18 1837  TROPONINI 0.04* <0.03 <0.03 <0.03   BNP (last 3 results) Recent Labs    02/20/18 1536  PROBNP 173.0*   CBG: Recent Labs  Lab 05/08/18 0934  GLUCAP 97   D-Dimer: Recent Labs    05/10/18 1454  DDIMER 0.64*   Hgb A1c: No results for input(s): HGBA1C in the last 72 hours. Lipid Profile: No results for input(s): CHOL, HDL, LDLCALC, TRIG, CHOLHDL, LDLDIRECT in the last 72 hours. Thyroid function studies: Recent Labs    05/11/18 0630  TSH 0.938   Anemia work up: No results for input(s): VITAMINB12, FOLATE, FERRITIN, TIBC, IRON, RETICCTPCT in the last 72 hours. Sepsis Labs: Recent Labs  Lab 05/10/18 2120 05/11/18 0630  WBC 7.6 6.2    Microbiology No results found for this or any previous visit (  from the past 240 hour(s)).  Procedures and diagnostic studies:  Dg Chest 2 View  Result Date: 05/10/2018 CLINICAL DATA:  Arrhythmia. Hypoxia. EXAM: CHEST - 2 VIEW COMPARISON:  None. FINDINGS: There is cardiomegaly with pulmonary vascular congestion. No discrete effusions. No acute bone abnormality. IMPRESSION: Cardiomegaly with pulmonary vascular congestion consistent with mild congestive heart failure. Electronically Signed   By: Lorriane Shire M.D.   On: 05/10/2018 15:42   Ct Angio Chest Pe W And/or Wo Contrast  Result Date: 05/10/2018 CLINICAL DATA:  Hypoxia room air. History of hypertension, polycystic ovarian syndrome left ventricular hypertrophy and GERD. History of breast biopsy in 2015 and left oophorectomy. EXAM: CT ANGIOGRAPHY CHEST WITH CONTRAST TECHNIQUE: Multidetector CT imaging of the chest was performed using the standard protocol during bolus administration of intravenous contrast. Multiplanar CT image reconstructions and MIPs were obtained  to evaluate the vascular anatomy. CONTRAST:  150m ISOVUE-370 IOPAMIDOL (ISOVUE-370) INJECTION 76% COMPARISON:  CXR 05/10/2018 FINDINGS: Cardiovascular: Cardiomegaly without pericardial effusion or thickening. Nonaneurysmal thoracic aorta without dissection. Aortic atherosclerosis at the arch. No large central pulmonary embolus to the segmental level. Satisfactory opacification of the pulmonary arteries is noted to the segmental level. Mediastinum/Nodes: Nonpathologic sized mediastinal lymph nodes, largest is lower paratracheal measuring up to 9 mm short axis. No hilar nor axillary lymphadenopathy. Lungs/Pleura: Too numerous to count bilateral pulmonary nodules, the largest measuring 15 mm in the right lower lobe with next largest in the right middle lobe measuring 9 mm. Largest in the left lung is medially within the lingula at 13 mm. Patchy mosaic attenuation of the lungs, nonspecific but may represent areas of interspersed air trapping, alveolitis/pneumonitis and/or hypoventilatory change. Upper Abdomen: No enhancing mass of the included liver. The adrenal glands are excluded on this study. Musculoskeletal: No chest wall abnormality. No acute or significant osseous findings. Osteophytes along the mid to lower thoracic spine with degenerative disc disease consistent thoracic spondylosis. Review of the MIP images confirms the above findings. IMPRESSION: 1. No acute pulmonary embolus. 2. There are scattered too numerous to count bilateral pulmonary nodules, the largest is 15 mm in the right lower lobe and in the left lung, 13 mm within the lingula. Although findings could be postinfectious or inflammatory in etiology, the possibility of pulmonary metastasis is of concern. Non-contrast chest CT at 3-6 months is recommended. If the nodules are stable at time of repeat CT, then future CT at 18-24 months (from today's scan) is considered optional for low-risk patients, but is recommended for high-risk patients. This  recommendation follows the consensus statement: Guidelines for Management of Incidental Pulmonary Nodules Detected on CT Images: From the Fleischner Society 2017; Radiology 2017; 284:228-243. Can also consider PET-CT or potentially percutaneous sampling of accessible nodules as clinically warranted. 3. Patchy mosaic attenuation of the lungs, nonspecific but may represent areas of interspersed air trapping, alveolitis/pneumonitis and/or hypoventilatory change. 4. Cardiomegaly. Aortic Atherosclerosis (ICD10-I70.0). Electronically Signed   By: DAshley RoyaltyM.D.   On: 05/10/2018 22:44    Medications:   . amLODipine  5 mg Oral Daily  . atenolol  25 mg Oral QPM  . enoxaparin (LOVENOX) injection  80 mg Subcutaneous Q24H  . fluticasone  2 spray Each Nare Daily  . furosemide  40 mg Oral Daily  . hydrALAZINE  100 mg Oral QHS  . hydrALAZINE  50 mg Oral BID WC  . [START ON 05/13/2018] levothyroxine  75 mcg Oral Q MTWThF  . nitroGLYCERIN  1 inch Topical Q6H  . pantoprazole  40  mg Oral Daily  . potassium chloride SA  20 mEq Oral Daily  . potassium chloride  40 mEq Oral Once   Continuous Infusions:   LOS: 0 days   Rondell A Smith  [facility]   *Please refer to [email or web address], password [email or web address] to get updated schedule on who will round on this patient, as hospitalists switch teams weekly. If 7PM-7AM, please contact night-coverage at [email or web address], password [email or web address] for any overnight needs.

## 2018-05-12 NOTE — Progress Notes (Addendum)
Patient admitted after midnight.  Agree with overall assessment and plan as seen on H&P.  Potassium low at 2.8 will give 40 mEq of potassium chloride p.o.  Patient otherwise comfortable currently on nasal cannula oxygen of 2 L.  She questions what caused her heart failure.  Had long discussion with patient regarding possible causes of symptoms and went over all studies and labs performed so far.  See full H&P for further details.

## 2018-05-13 ENCOUNTER — Encounter: Payer: Self-pay | Admitting: Nurse Practitioner

## 2018-05-13 ENCOUNTER — Ambulatory Visit: Payer: BLUE CROSS/BLUE SHIELD | Admitting: Physical Therapy

## 2018-05-13 ENCOUNTER — Ambulatory Visit: Payer: BLUE CROSS/BLUE SHIELD | Admitting: Occupational Therapy

## 2018-05-13 LAB — RHEUMATOID FACTOR: Rheumatoid fact SerPl-aCnc: 26.4 IU/mL — ABNORMAL HIGH (ref 0.0–13.9)

## 2018-05-13 LAB — ANGIOTENSIN CONVERTING ENZYME: Angiotensin-Converting Enzyme: 40 U/L (ref 14–82)

## 2018-05-13 LAB — BASIC METABOLIC PANEL
Anion gap: 3 — ABNORMAL LOW (ref 5–15)
BUN: 14 mg/dL (ref 6–20)
CO2: 43 mmol/L — ABNORMAL HIGH (ref 22–32)
Calcium: 9.5 mg/dL (ref 8.9–10.3)
Chloride: 95 mmol/L — ABNORMAL LOW (ref 98–111)
Creatinine, Ser: 0.94 mg/dL (ref 0.44–1.00)
GFR calc Af Amer: >60 mL/min (ref >60)
GFR calc non Af Amer: >60 mL/min (ref >60)
Glucose, Bld: 106 mg/dL — ABNORMAL HIGH (ref 70–99)
Potassium: 3.8 mmol/L (ref 3.5–5.1)
Sodium: 141 mmol/L (ref 135–145)

## 2018-05-13 LAB — CBC
HEMATOCRIT: 43.5 % (ref 36.0–46.0)
Hemoglobin: 12.9 g/dL (ref 12.0–15.0)
MCH: 25.5 pg — ABNORMAL LOW (ref 26.0–34.0)
MCHC: 29.7 g/dL — ABNORMAL LOW (ref 30.0–36.0)
MCV: 86.1 fL (ref 80.0–100.0)
NRBC: 0 % (ref 0.0–0.2)
Platelets: 253 10*3/uL (ref 150–400)
RBC: 5.05 MIL/uL (ref 3.87–5.11)
RDW: 16.8 % — AB (ref 11.5–15.5)
WBC: 6.8 10*3/uL (ref 4.0–10.5)

## 2018-05-13 LAB — MAGNESIUM: Magnesium: 2.1 mg/dL (ref 1.7–2.4)

## 2018-05-13 MED ORDER — LISINOPRIL 10 MG PO TABS: 10.0000 mg | ORAL_TABLET | Freq: Every day | ORAL | 0 refills | Status: DC

## 2018-05-13 MED ORDER — POTASSIUM CHLORIDE CRYS ER 20 MEQ PO TBCR: 20.0000 meq | EXTENDED_RELEASE_TABLET | Freq: Two times a day (BID) | ORAL | Status: DC

## 2018-05-13 MED ORDER — FUROSEMIDE 40 MG PO TABS: 40.0000 mg | ORAL_TABLET | Freq: Two times a day (BID) | ORAL | 0 refills | Status: DC

## 2018-05-13 MED ORDER — FUROSEMIDE 40 MG PO TABS: 40.0000 mg | ORAL_TABLET | Freq: Two times a day (BID) | ORAL | Status: DC

## 2018-05-13 MED ORDER — POTASSIUM CHLORIDE CRYS ER 20 MEQ PO TBCR: 20.0000 meq | EXTENDED_RELEASE_TABLET | Freq: Every day | ORAL | 1 refills | Status: DC

## 2018-05-13 NOTE — Discharge Summary (Signed)
Physician Discharge Summary  JOSETTA WIGAL LSL:[id number] DOB: [birth year] DOA: 05/10/2018  PCP: Flossie Buffy, NP  Admit date: 05/10/2018 Discharge date: 05/13/2018  Time spent: 45 minutes  Recommendations for Outpatient Follow-up:  1. Cardiology Dr. Percival Spanish on 1/15, please check bmet at follow-up, newly started on Lasix 40 mg twice daily instead of HCTZ 2. Home O2, home health RN 3. PCP in 1 week 4. [facility] in 1 month, needs PET scan   Discharge Diagnoses:  Principal Problem:   Acute respiratory failure with hypoxia (HCC)   Obesity   Acute on chronic diastolic CHF   Hypothyroidism   OSA on CPAP   Chronic lymphedema   Hyperparathyroidism ([place])   Pulmonary nodules   Hypertensive urgency   Discharge Condition: Improved  Diet recommendation: Low-sodium, heart healthy  Filed Weights   05/11/18 0656 05/12/18 0602 05/13/18 0500  Weight: (!) 155.4 kg (!) 155 kg (!) 155 kg    History of present illness:  MELAINA HOWERTON is an 54 y.o. female past medical history significant for hyperparathyroidism, hypothyroidism, OSA on CPAP, and diastolic CHF last EF noted to be 60 to 65% with grade 1 diastolic dysfunction in 1157; who presented with hypoxia that was incidentally noted at the time of evaluation for colonoscopy   Hospital Course:   Acute/chronic hypoxic respiratory failure  -I suspect her hypoxemia was chronic as she was asymptomatic and incidentally noted to be hypoxic at the time of prep for colonoscopy  -Upon further evaluation her d-dimer was elevated and hence CT angiogram was completed which was negative for pulmonary embolism but did note innumerable pulmonary nodules  -She was also felt to be slightly volume overloaded and hence diuresed with IV Lasix briefly  -Clinically improving, also has a component of OSA/OHS which is contributing -Stable now, felt to be euvolemic, discharged home on 2 L home O2, oral diuretics -With close cardiology and pulmonary  follow-up  Extensive pulmonary nodules  -Etiology of this is not clear, she had a positive ANA and are RF factor but negative CCP and no evidence of synovitis in hands rheumatoid arthritis is felt to be less likely, ACE level was normal so sarcoid less likely -Seen by pulmonary this admission, recommended outpatient follow-up and PET scan  Acute on chronic diastolic CHF -Last EF noted to be 03/2018 with EF of 60 to 65% with grade 1 diastolic dysfunction mild pulmonary hypertension.   -She was diuresed with IV Lasix, followed by cardiology this admission, per cards symptoms were felt to be less likely to CHF  -Subsequently transitioned to oral Lasix 40 mg twice daily , I have discontinued her HCTZ, her dose had already been previously cut down as outpatient due to hypercalcemia from HCTZ  -Advised close monitoring of labs with be met to evaluate potassium, creatinine in few days -Also started on low-dose ACE inhibitor -She has a follow-up coming up with Dr. Percival Spanish on Wednesday 1/15 -Amlodipine was discontinued due to chronic lower extremity edema/lymphedema  Elevated cardiac troponin: - Now resolved.  Initial troponin 0.04->0.03.   -No symptoms of ACS, prior to admission was scheduled for stress test this week per Dr. Percival Spanish   Hypothyroidism and hypoparathyroidism: TSH 0.938 - Continue thyroxine - Continue outpatient follow-up  Chronic lymphedema - Follow-up in the lymphedema clinic  OSA on CPAP - Continue CPAP per pharmacy  Morbid obesity   -body mass index is 62.5 kg/  Discharge Exam: Vitals:   05/12/18 2306 05/13/18 0716  BP: 140/87 (!) 144/74  Pulse: 74 82  Resp: 18   Temp: 98.7 F (37.1 C) 98.3 F (36.8 C)  SpO2: 95% 100%    General: AAOx3 Cardiovascular: S1S2/RRR Respiratory: CTAB  Discharge Instructions   Discharge Instructions    Diet - low sodium heart healthy   Complete by:  As directed    Increase activity slowly   Complete by:  As directed       Allergies as of 05/13/2018      Reactions   Fentanyl Hives   Levofloxacin Hives   Midazolam Hives   Pollen Extract    seasonal   Atorvastatin    Muscle pain in legs    Rosuvastatin    Abdominal pain      Medication List    STOP taking these medications   amLODipine 2.5 MG tablet Commonly known as:  NORVASC   cefixime 400 MG Caps capsule Commonly known as:  SUPRAX   hydrochlorothiazide 50 MG tablet Commonly known as:  HYDRODIURIL   ibuprofen 200 MG tablet Commonly known as:  ADVIL,MOTRIN   omega-3 fish oil 1000 MG Caps capsule Commonly known as:  MAXEPA     TAKE these medications   acetaminophen 500 MG tablet Commonly known as:  TYLENOL Take 1,000 mg by mouth 3 (three) times daily as needed for moderate pain or headache.   atenolol 25 MG tablet Commonly known as:  TENORMIN Take 25 mg by mouth every evening.   benzonatate 100 MG capsule Commonly known as:  TESSALON Take 1 capsule (100 mg total) by mouth 3 (three) times daily as needed for cough.   cetirizine 10 MG tablet Commonly known as:  ZYRTEC ALLERGY Take 1 tablet (10 mg total) by mouth daily. What changed:    when to take this  reasons to take this   ergocalciferol 1.25 MG (50000 UT) capsule Commonly known as:  VITAMIN D2 Take 1 capsule (50,000 Units total) by mouth 3 (three) times a week.   Vitamin D (Ergocalciferol) 1.25 MG (50000 UT) Caps capsule Commonly known as:  DRISDOL Take 1 capsule (50,000 Units total) by mouth every 7 (seven) days.   esomeprazole 40 MG capsule Commonly known as:  NEXIUM TAKE ONE CAPSULE BY MOUTH TWICE DAILY   fluticasone 50 MCG/ACT nasal spray Commonly known as:  FLONASE Place 2 sprays into both nostrils daily.   furosemide 40 MG tablet Commonly known as:  LASIX Take 1 tablet (40 mg total) by mouth 2 (two) times daily. What changed:  when to take this   guaiFENesin-dextromethorphan 100-10 MG/5ML syrup Commonly known as:  ROBITUSSIN DM Take 10 mLs by  mouth every 8 (eight) hours as needed for cough.   hydrALAZINE 50 MG tablet Commonly known as:  APRESOLINE Take 1tab in morning and afternoon, and 2tabs at bedtime   levothyroxine 75 MCG tablet Commonly known as:  SYNTHROID, LEVOTHROID TK 1 T PO QD  MONDAY-FRIDAY   lisinopril 10 MG tablet Commonly known as:  PRINIVIL,ZESTRIL Take 1 tablet (10 mg total) by mouth daily.   metFORMIN 500 MG tablet Commonly known as:  GLUCOPHAGE Take 1 tablet (500 mg total) by mouth daily with breakfast.   polyethylene glycol-electrolytes 420 g solution Commonly known as:  NuLYTELY/GoLYTELY Take 4,000 mLs by mouth as directed.   potassium chloride SA 20 MEQ tablet Commonly known as:  K-DUR,KLOR-CON Take 1 tablet (20 mEq total) by mouth daily.            Durable Medical Equipment  (From admission, onward)  Start     Ordered   05/13/18 1156  For home use only DME oxygen  Once    Question Answer Comment  Mode or (Route) Nasal cannula   Liters per Minute 2   Frequency Continuous (stationary and portable oxygen unit needed)   Oxygen conserving device Yes   Oxygen delivery system Gas      05/13/18 1156         Allergies  Allergen Reactions  . Fentanyl Hives  . Levofloxacin Hives  . Midazolam Hives  . Pollen Extract     seasonal  . Atorvastatin     Muscle pain in legs   . Rosuvastatin     Abdominal pain   Follow-up Information    [name], Charlene Brooke, NP. Schedule an appointment as soon as possible for a visit in 1 week(s).   Specialty:  Internal Medicine Contact information: [place] [address] [phone]        Minus Breeding, MD Follow up on 05/15/2018.   Specialty:  Cardiology Contact information: [address] [place] [address] [place] Follow up.   Why:  [employer] Contact information: [address] [place] [address] [place], [facility] Follow up.   Specialty:  Home Health Services Why:  [place] information: [place] [address] [phone]            The results of significant diagnostics from this hospitalization (including imaging, microbiology, ancillary and laboratory) are listed below for reference.    Significant Diagnostic Studies: Dg Chest 2 View  Result Date: 05/10/2018 CLINICAL DATA:  Arrhythmia. Hypoxia. EXAM: CHEST - 2 VIEW COMPARISON:  None. FINDINGS: There is cardiomegaly with pulmonary vascular congestion. No discrete effusions. No acute bone abnormality. IMPRESSION: Cardiomegaly with pulmonary vascular congestion consistent with mild congestive heart failure. Electronically Signed   By: Lorriane Shire M.D.   On: 05/10/2018 15:42   Ct Angio Chest Pe W And/or Wo Contrast  Result Date: 05/10/2018 CLINICAL DATA:  Hypoxia room air. History of hypertension, polycystic ovarian syndrome left ventricular hypertrophy and GERD. History of breast biopsy in 2015 and left oophorectomy. EXAM: CT ANGIOGRAPHY CHEST WITH CONTRAST TECHNIQUE: Multidetector CT imaging of the chest was performed using the standard protocol during bolus administration of intravenous contrast. Multiplanar CT image reconstructions and MIPs were obtained to evaluate the vascular anatomy. CONTRAST:  148m ISOVUE-370 IOPAMIDOL (ISOVUE-370) INJECTION 76% COMPARISON:  CXR 05/10/2018 FINDINGS: Cardiovascular: Cardiomegaly without pericardial effusion or thickening. Nonaneurysmal thoracic aorta without dissection. Aortic atherosclerosis at the arch. No large central pulmonary embolus to the segmental level. Satisfactory opacification of the pulmonary arteries is noted to the segmental level. Mediastinum/Nodes: Nonpathologic sized mediastinal lymph nodes, largest is lower paratracheal measuring up to 9 mm short axis. No hilar nor axillary lymphadenopathy. Lungs/Pleura: Too numerous to count bilateral pulmonary  nodules, the largest measuring 15 mm in the right lower lobe with next largest in the right middle lobe measuring 9 mm. Largest in the left lung is medially within the lingula at 13 mm. Patchy mosaic attenuation of the lungs, nonspecific but may represent areas of interspersed air trapping, alveolitis/pneumonitis and/or hypoventilatory change. Upper Abdomen: No enhancing mass of the included liver. The adrenal glands are excluded on this study. Musculoskeletal: No chest wall abnormality. No acute or significant osseous findings. Osteophytes along the mid to lower  thoracic spine with degenerative disc disease consistent thoracic spondylosis. Review of the MIP images confirms the above findings. IMPRESSION: 1. No acute pulmonary embolus. 2. There are scattered too numerous to count bilateral pulmonary nodules, the largest is 15 mm in the right lower lobe and in the left lung, 13 mm within the lingula. Although findings could be postinfectious or inflammatory in etiology, the possibility of pulmonary metastasis is of concern. Non-contrast chest CT at 3-6 months is recommended. If the nodules are stable at time of repeat CT, then future CT at 18-24 months (from today's scan) is considered optional for low-risk patients, but is recommended for high-risk patients. This recommendation follows the consensus statement: Guidelines for Management of Incidental Pulmonary Nodules Detected on CT Images: From the Fleischner Society 2017; Radiology 2017; 284:228-243. Can also consider PET-CT or potentially percutaneous sampling of accessible nodules as clinically warranted. 3. Patchy mosaic attenuation of the lungs, nonspecific but may represent areas of interspersed air trapping, alveolitis/pneumonitis and/or hypoventilatory change. 4. Cardiomegaly. Aortic Atherosclerosis (ICD10-I70.0). Electronically Signed   By: Ashley Royalty M.D.   On: 05/10/2018 22:44   US Breast Ltd Uni Left Inc Axilla  Result Date: 04/23/2018 CLINICAL  DATA:  Patient presents with symptoms of left nipple itching and pain. No nipple skin discoloration or scaling. No discharge. EXAM: DIGITAL DIAGNOSTIC LEFT MAMMOGRAM WITH CAD AND TOMO ULTRASOUND LEFT BREAST COMPARISON:  Previous exam(s). ACR Breast Density Category b: There are scattered areas of fibroglandular density. FINDINGS: There are no discrete masses, areas of architectural distortion, areas of significant asymmetry or suspicious calcifications. No mammographic change. Mammographic images were processed with CAD. On physical exam, no mass is palpated in the retroareolar left breast. There is no skin discoloration or scaling. Targeted ultrasound is performed, showing normal tissue in the retroareolar left breast. No masses. No duct ectasia. IMPRESSION: Negative exam.  No evidence of breast malignancy. RECOMMENDATION: Screening mammogram in July 2020.(Code:SM-B-01Y) I have discussed the findings and recommendations with the patient. Results were also provided in writing at the conclusion of the visit. If applicable, a reminder letter will be sent to the patient regarding the next appointment. BI-RADS CATEGORY  1: Negative. Electronically Signed   By: [name] M.D.   On: 04/23/2018 11:49   Mm Diag Breast Tomo Uni Left  Result Date: 04/23/2018 CLINICAL DATA:  Patient presents with symptoms of left nipple itching and pain. No nipple skin discoloration or scaling. No discharge. EXAM: DIGITAL DIAGNOSTIC LEFT MAMMOGRAM WITH CAD AND TOMO ULTRASOUND LEFT BREAST COMPARISON:  Previous exam(s). ACR Breast Density Category b: There are scattered areas of fibroglandular density. FINDINGS: There are no discrete masses, areas of architectural distortion, areas of significant asymmetry or suspicious calcifications. No mammographic change. Mammographic images were processed with CAD. On physical exam, no mass is palpated in the retroareolar left breast. There is no skin discoloration or scaling. Targeted ultrasound  is performed, showing normal tissue in the retroareolar left breast. No masses. No duct ectasia. IMPRESSION: Negative exam.  No evidence of breast malignancy. RECOMMENDATION: Screening mammogram in July 2020.(Code:SM-B-01Y) I have discussed the findings and recommendations with the patient. Results were also provided in writing at the conclusion of the visit. If applicable, a reminder letter will be sent to the patient regarding the next appointment. BI-RADS CATEGORY  1: Negative. Electronically Signed   By: [name] M.D.   On: 04/23/2018 11:49    Microbiology: No results found for this or any previous visit (from the past 240 hour(s)).  Labs: Basic Metabolic Panel: Recent Labs  Lab 05/10/18 2120 05/11/18 0630 05/12/18 0218 05/13/18 0515  NA 139 142 139 141  K 3.0* 2.8* 3.2* 3.8  CL 97* 95* 94* 95*  CO2 35* 36* 39* 43*  GLUCOSE 117* 103* 97 106*  BUN 22* _0 CREATININE 0.94 0.88 1.03* 0.94  CALCIUM 9.8 9.5 9.3 9.5  MG  --  1.8 2.3 2.1   Liver Function Tests: Recent Labs  Lab 05/11/18 0630  AST 25  ALT 39  ALKPHOS 71  BILITOT 1.6*  PROT 6.1*  ALBUMIN 3.1*   No results for input(s): LIPASE, AMYLASE in the last 168 hours. No results for input(s): AMMONIA in the last 168 hours. CBC: Recent Labs  Lab 05/10/18 2120 05/11/18 0630 05/13/18 0515  WBC 7.6 6.2 6.8  NEUTROABS  --  4.6  --   HGB 14.4 13.3 12.9  HCT 47.8* 43.8 43.5  MCV 84.9 83.4 86.1  PLT 252 245 253   Cardiac Enzymes: Recent Labs  Lab 05/10/18 2120 05/11/18 0630 05/11/18 1255 05/11/18 1837  TROPONINI 0.04* <0.03 <0.03 <0.03   BNP: BNP (last 3 results) Recent Labs    05/10/18 2120  BNP 199.8*    ProBNP (last 3 results) Recent Labs    02/20/18 1536  PROBNP 173.0*    CBG: Recent Labs  Lab 05/08/18 0934  GLUCAP 97       Signed:  Domenic Polite MD.  [facility] 05/13/2018, 2:25 PM

## 2018-05-13 NOTE — Progress Notes (Signed)
Cardiology Office Note   Date:  05/15/2018   ID:  Madison Palmer, DOB 1965-06-09, MRN [id number]  PCP:  Flossie Buffy, NP  Cardiologist:   Minus Breeding, MD   Chief Complaint  Patient presents with  . Shortness of Breath      History of Present Illness: Madison Palmer is a 53 y.o. female who is referred by [name], Charlene Brooke, NP for evaluation of difficult to control HTN and LVH.   This was done when she was noted to have ventricular bigeminy.  She did have an echo earlier this month with a normal EF of 60 - 65% with severe concentric LVH.   She also is noted to have a past history of difficult to control hypertension.  She moved here from [place].  She saw her cardiologist there and I was able to review she is had LVH with preserved ejection fraction.  She had some mild internal carotid artery plaque noted on Doppler in the past.  She had a perfusion study in 2018 that demonstrated no evidence of ischemia or infarct.  She is been managed for her hypertension.   She was given Norvasc recently but did not start this because of lower extremity swelling.  At one point I saw her fo a weight gain of 20 pounds in 1 month. She said that she thought her weight started when she had her hydrochlorothiazide reduced because they thought it was exacerbating her hypercalcemia related to hyperparathyroidism.  The reduction in the hydrochlorothiazide happened at the same time as the weight gain.  She restarted 50 mg hydrochlorothiazide on her own and she thinks her swelling go better.    She was in the hospital and discharged on 1/13.  She had respiratory failure.  She was noted to have multiple pulmonary nodules.  She was seen by pulmonary.   She was felt that it was less likely that she had acute diastolic HF.  She was, however, treated with Lasix.  She was started on ACE inhibitor.  HCTZ was discontinued.  She comes today with multiple questions.  She is very concerned about the diagnosis.   She questions being told about heart failure.  She says her blood pressure is very hard to control but now it is currently well controlled.  She denies any chest pressure, neck or arm discomfort.  Has her chronic dyspnea and is on oxygen.  She is not having any PND or orthopnea.  She has had no weight gain or edema.   She has some chronic dyspnea but this is not new.  She is not noticing any PVCs and has no palpitations, presyncope or syncope.  She has no chest pressure, neck or arm discomfort.  She is starting with the Healthy Weight program.  She had gastric surgery reversed because of reflux in the past.  She also has problems with lymphedema.    Past Medical History:  Diagnosis Date  . Anemia   . Anxiety   . Arrhythmia    tachycardia  . Arthritis   . Chickenpox   . Depression   . Diverticulitis   . GERD (gastroesophageal reflux disease)   . Glaucoma   . Hyperlipidemia   . Hyperparathyroidism ([place])   . Hypertension   . Inflammatory polyps of colon ([place])   . LVH (left ventricular hypertrophy)   . Lymphedema   . PCOS (polycystic ovarian syndrome)   . Prediabetes   . Recurrent UTI   . Sleep apnea  CPAP  . Thyroid disease   . Vitamin D deficiency     Past Surgical History:  Procedure Laterality Date  . BREAST BIOPSY  2015  . CESAREAN SECTION  2004  . INNER EAR SURGERY     ear and sinus surgery  . LAPAROSCOPIC REPAIR AND REMOVAL OF GASTRIC BAND    . OOPHORECTOMY Left   . TONSILLECTOMY AND ADENOIDECTOMY       Current Outpatient Medications  Medication Sig Dispense Refill  . acetaminophen (TYLENOL) 500 MG tablet Take 1,000 mg by mouth 3 (three) times daily as needed for moderate pain or headache.    Marland Kitchen atenolol (TENORMIN) 25 MG tablet Take 25 mg by mouth daily.   0  . benzonatate (TESSALON) 100 MG capsule Take 1 capsule (100 mg total) by mouth 3 (three) times daily as needed for cough. 20 capsule 0  . cetirizine (ZYRTEC ALLERGY) 10 MG tablet Take 1 tablet (10 mg total)  by mouth daily. (Patient taking differently: Take 10 mg by mouth as needed for allergies. Use as needed for allergies) 30 tablet 5  . esomeprazole (NEXIUM) 40 MG capsule TAKE ONE CAPSULE BY MOUTH TWICE DAILY 180 capsule 0  . fluticasone (FLONASE) 50 MCG/ACT nasal spray Place 2 sprays into both nostrils daily.    . furosemide (LASIX) 40 MG tablet Take 1 tablet (40 mg total) by mouth 2 (two) times daily. 60 tablet 0  . hydrALAZINE (APRESOLINE) 50 MG tablet Take 1tab in morning and afternoon, and 2tabs at bedtime 120 tablet 6  . levothyroxine (SYNTHROID, LEVOTHROID) 75 MCG tablet TK 1 T PO QD  MONDAY-FRIDAY  0  . lisinopril (PRINIVIL,ZESTRIL) 10 MG tablet Take 1 tablet (10 mg total) by mouth daily. 30 tablet 0  . potassium chloride SA (K-DUR,KLOR-CON) 20 MEQ tablet Take 1 tablet (20 mEq total) by mouth daily. 30 tablet 1  . Vitamin D, Ergocalciferol, (DRISDOL) 1.25 MG (50000 UT) CAPS capsule Take 1 capsule (50,000 Units total) by mouth every 7 (seven) days. 4 capsule 0  . metFORMIN (GLUCOPHAGE) 500 MG tablet Take 1 tablet (500 mg total) by mouth daily with breakfast. 30 tablet 0   No current facility-administered medications for this visit.     Allergies:   Fentanyl; Levofloxacin; Midazolam; Pollen extract; Atorvastatin; and Rosuvastatin    ROS:  Please see the history of present illness.   Otherwise, review of systems are positive for none.   All other systems are reviewed and negative.    PHYSICAL EXAM: VS:  BP (!) 174/96   Pulse 100   Ht 5\' 2"  (1.575 m)   Wt (!) 343 lb (155.6 kg)   SpO2 90%   BMI 62.74 kg/m  , BMI Body mass index is 62.74 kg/m. GEN:  No distress NECK:  No jugular venous distention at 90 degrees, waveform within normal limits, carotid upstroke brisk and symmetric, no bruits, no thyromegaly LYMPHATICS:  No cervical adenopathy LUNGS:  Clear to auscultation bilaterally BACK:  No CVA tenderness CHEST:  Unremarkable HEART:  S1 and S2 within normal limits, no S3, no S4,  no clicks, no rubs, no murmurs ABD:  Positive bowel sounds normal in frequency in pitch, no bruits, no rebound, no guarding, unable to assess midline mass or bruit with the patient seated. EXT:  2 plus pulses throughout, moderate/severe edema, no cyanosis no clubbing SKIN:  No rashes no nodules NEURO:  Cranial nerves II through XII grossly intact, motor grossly intact throughout PSYCH:  Cognitively intact, oriented to person place  and time    EKG:  EKG is not ordered today.   Recent Labs: 02/20/2018: Pro B Natriuretic peptide (BNP) 173.0 05/10/2018: B Natriuretic Peptide 199.8 05/11/2018: ALT 39; TSH 0.938 05/13/2018: BUN 14; Creatinine, Ser 0.94; Hemoglobin 12.9; Magnesium 2.1; Platelets 253; Potassium 3.8; Sodium 141    Lipid Panel    Component Value Date/Time   CHOL 196 02/27/2018 1233   TRIG 142 02/27/2018 1233   HDL 46 02/27/2018 1233   CHOLHDL 5 09/10/2017 1156   VLDL 26.6 09/10/2017 1156   LDLCALC 122 (H) 02/27/2018 1233      Wt Readings from Last 3 Encounters:  05/15/18 (!) 343 lb (155.6 kg)  05/14/18 (!) 341 lb (154.7 kg)  05/13/18 (!) 341 lb 11.4 oz (155 kg)      Other studies Reviewed: Additional studies/ records that were reviewed today include:  Pulmonary and hospitalist notes Review of the above records demonstrates:  See above   ASSESSMENT AND PLAN:  HTN:  She needs a BMET.  She was started on ACE inhibitor.  Although her blood pressure here was elevated it has not been elevated otherwise and she is keeping a close eye on it.  I will continue with the current therapy.  PULMONARY NODULES:  She is to have a PET scan.  I will follow the pulmonary work-up and consider further cardiovascular testing based on this and the results below.  LVH: I had her set up for PYP scan which was done yesterday but not resulted.  I will await this and consider the need for cardiac MRI.  It was thought when she was living in [place] that she had LVH related to  hypertension.  LYMPHEDEMA:   She was to follow-up with OT for management of this.  EDEMA:   Hydrochlorothiazide was stopped.  She is now on Lasix.  We talked about salt and fluid restriction.  I do not think she is had a low-salt diet at this point.  ACUTE DIASTOLIC HF: We had a long discussion about this.  I explained the nature of this given the LVH.  She understands this now I think he has a diagnosis.  However, she also understands we did not think that significant volume overload was a primary contributor to her complaints.  We talked about salt and fluid restriction.  She will continue the current diuretic.  OBESITY: We talked about diet.  She is following up with the weight loss program.  CAROTID STENOSIS:   She had mild disease in the past.  No change in therapy.   Current medicines are reviewed at length with the patient today.  The patient does not have concerns regarding medicines.  The following changes have been made:  None  Labs/ tests ordered today include:    No orders of the defined types were placed in this encounter.    Disposition:   FU with me in 6 weeks.    Signed, Minus Breeding, MD  05/15/2018 5:29 PM    [facility]

## 2018-05-13 NOTE — Progress Notes (Signed)
Patient Saturations on Room Air at Rest = 88%  Patient Saturations on [employer] while Ambulating = 86%  Patient Saturations on 1L Liters of oxygen while Ambulating = 88%  Patient Saturations on 2L Liters of oxygen while Ambulating = 89%   Patient Saturations on 3L Liters of oxygen while Ambulating = 90%  Patient Saturations on 4L Liters of oxygen while Ambulating = 93%

## 2018-05-13 NOTE — Care Management Note (Addendum)
Case Management Note  Patient Details  Name: Madison Palmer MRN: [id number] Date of Birth: 1966/03/16  Subjective/Objective:   Patient for dc today , will need home oxygen , she would like to work with [facility], referral given to [facility] for home oxygen.  She will need HHRN also for disease management, she chose [facility] from the Medicare.gove list.  Referral made to [facility]. Soc will begin 24-48 hrs post dc.                  Action/Plan: DC home with home oxygen and HHRN.  Expected Discharge Date:  05/13/18               Expected Discharge Plan:  Home/Self Care  In-House Referral:     Discharge planning Services  CM Consult  Post Acute Care Choice:  Durable Medical Equipment Choice offered to:  Patient  DME Arranged:  Oxygen DME Agency:  [place]:   [place]:   [place]  Status of Service:  Completed, signed off  If discussed at [place] of Stay Meetings, dates discussed:    Additional Comments:  Zenon Mayo, RN 05/13/2018, 11:58 AM

## 2018-05-14 ENCOUNTER — Encounter: Payer: Self-pay | Admitting: Nurse Practitioner

## 2018-05-14 ENCOUNTER — Ambulatory Visit (HOSPITAL_COMMUNITY)
Admission: RE | Admit: 2018-05-14 | Discharge: 2018-05-14 | Disposition: A | Discharge status: Home / Self Care | Payer: BLUE CROSS/BLUE SHIELD | Source: Ambulatory Visit | Attending: Cardiovascular Disease | Admitting: Cardiovascular Disease

## 2018-05-14 DIAGNOSIS — I517 Cardiomegaly: Secondary | ICD-10-CM | POA: Diagnosis present

## 2018-05-14 DIAGNOSIS — R0602 Shortness of breath: Secondary | ICD-10-CM

## 2018-05-14 MED ORDER — TECHNETIUM TC 99M PYROPHOSPHATE
20.5000 | Freq: Once | INTRAVENOUS | Status: AC
  Administered 2018-05-14: 20.5 via INTRAVENOUS

## 2018-05-15 ENCOUNTER — Encounter: Payer: Self-pay | Admitting: Cardiology

## 2018-05-15 ENCOUNTER — Telehealth: Payer: Self-pay | Admitting: Pulmonary Disease

## 2018-05-15 ENCOUNTER — Other Ambulatory Visit: Payer: Self-pay | Admitting: Nurse Practitioner

## 2018-05-15 ENCOUNTER — Inpatient Hospital Stay: Payer: BLUE CROSS/BLUE SHIELD | Admitting: Nurse Practitioner

## 2018-05-15 ENCOUNTER — Ambulatory Visit: Payer: BLUE CROSS/BLUE SHIELD | Admitting: Cardiology

## 2018-05-15 VITALS — BP 174/96 | HR 100 | Ht 62.0 in | Wt 343.0 lb

## 2018-05-15 DIAGNOSIS — J9601 Acute respiratory failure with hypoxia: Secondary | ICD-10-CM

## 2018-05-15 DIAGNOSIS — R918 Other nonspecific abnormal finding of lung field: Secondary | ICD-10-CM

## 2018-05-15 DIAGNOSIS — I5033 Acute on chronic diastolic (congestive) heart failure: Secondary | ICD-10-CM | POA: Insufficient documentation

## 2018-05-15 DIAGNOSIS — R0902 Hypoxemia: Secondary | ICD-10-CM

## 2018-05-15 DIAGNOSIS — I5031 Acute diastolic (congestive) heart failure: Secondary | ICD-10-CM

## 2018-05-15 DIAGNOSIS — I5032 Chronic diastolic (congestive) heart failure: Secondary | ICD-10-CM | POA: Insufficient documentation

## 2018-05-15 DIAGNOSIS — I517 Cardiomegaly: Secondary | ICD-10-CM

## 2018-05-15 DIAGNOSIS — R0600 Dyspnea, unspecified: Secondary | ICD-10-CM

## 2018-05-15 NOTE — Patient Instructions (Signed)
Medication Instructions:  Continue current medications  If you need a refill on your cardiac medications before your next appointment, please call your pharmacy.  Labwork: None Ordered   Take the provided lab slips with you to the lab for your blood draw.  When you have your labs (blood work) drawn today and your tests are completely normal, you will receive your results only by MyChart Message (if you have MyChart) -OR-  A paper copy in the mail.  If you have any lab test that is abnormal or we need to change your treatment, we will call you to review these results.  Testing/Procedures: None Ordered  Follow-Up: Your physician recommends that you schedule a follow-up appointment in: 6 Weeks   At [facility], you and your health needs are our priority.  As part of our continuing mission to provide you with exceptional heart care, we have created designated Provider Care Teams.  These Care Teams include your primary Cardiologist (physician) and Advanced Practice Providers (APPs -  Physician Assistants and Nurse Practitioners) who all work together to provide you with the care you need, when you need it.  Thank you for choosing [facility] at [facility]!!

## 2018-05-15 NOTE — Telephone Encounter (Signed)
Call made to patient, patient states she saw Dr Elsworth Soho over the weekend in the ED and he told her she needed a PET scan. I informed the patient I would have to speak with RA prior to placing the order. Voiced understanding.   RA please advise if you want order placed for PET scan. If so please give Dx.

## 2018-05-15 NOTE — Telephone Encounter (Signed)
Per RA: Place order for PET scan - multiple pulmonary nodules with OV after.  Order for PET placed. Call made to patient to make aware. Office visit made for 05/29/2018 at 245pm. Voiced understanding. Nothing further is needed at this time.

## 2018-05-15 NOTE — Telephone Encounter (Signed)
Call made to patient, patient states she saw Dr. Elsworth Soho

## 2018-05-15 NOTE — Telephone Encounter (Signed)
[facility]ace order for PET scan - multiple pulmonary nodules OV after

## 2018-05-17 ENCOUNTER — Encounter: Payer: Self-pay | Admitting: Physical Therapy

## 2018-05-17 ENCOUNTER — Ambulatory Visit: Payer: BLUE CROSS/BLUE SHIELD | Admitting: Physical Therapy

## 2018-05-17 DIAGNOSIS — R262 Difficulty in walking, not elsewhere classified: Secondary | ICD-10-CM

## 2018-05-17 DIAGNOSIS — G8929 Other chronic pain: Secondary | ICD-10-CM

## 2018-05-17 DIAGNOSIS — M545 Low back pain, unspecified: Secondary | ICD-10-CM

## 2018-05-17 DIAGNOSIS — M25562 Pain in left knee: Secondary | ICD-10-CM | POA: Diagnosis present

## 2018-05-17 DIAGNOSIS — I890 Lymphedema, not elsewhere classified: Secondary | ICD-10-CM

## 2018-05-17 DIAGNOSIS — M25561 Pain in right knee: Secondary | ICD-10-CM

## 2018-05-17 DIAGNOSIS — M542 Cervicalgia: Secondary | ICD-10-CM | POA: Diagnosis present

## 2018-05-17 NOTE — Therapy (Signed)
[address] [place], [place], [address] Phone: [phone]   Fax:  [phone]  Physical Therapy Treatment  Patient Details  Name: Madison Palmer MRN: [id number] Date of Birth: 1965/08/11 Referring Provider (PT): Mardelle Matte   Encounter Date: 05/17/2018  PT End of Session - 05/17/18 1056    Visit Number  2    Date for PT Re-Evaluation  07/05/18    PT Start Time  1025    PT Stop Time  1110    PT Time Calculation (min)  45 min    Activity Tolerance  Patient tolerated treatment well    Behavior During Therapy  [facility] for tasks assessed/performed       Past Medical History:  Diagnosis Date  . Anemia   . Anxiety   . Arrhythmia    tachycardia  . Arthritis   . Chickenpox   . Depression   . Diverticulitis   . GERD (gastroesophageal reflux disease)   . Glaucoma   . Hyperlipidemia   . Hyperparathyroidism ([place])   . Hypertension   . Inflammatory polyps of colon ([place])   . LVH (left ventricular hypertrophy)   . Lymphedema   . PCOS (polycystic ovarian syndrome)   . Prediabetes   . Recurrent UTI   . Sleep apnea    CPAP  . Thyroid disease   . Vitamin D deficiency     Past Surgical History:  Procedure Laterality Date  . BREAST BIOPSY  2015  . CESAREAN SECTION  2004  . INNER EAR SURGERY     ear and sinus surgery  . LAPAROSCOPIC REPAIR AND REMOVAL OF GASTRIC BAND    . OOPHORECTOMY Left   . TONSILLECTOMY AND ADENOIDECTOMY      There were no vitals filed for this visit.  Subjective Assessment - 05/17/18 1031    Subjective  "Physically I am about the same, Emotionally Im mixed, I don't want this oxygen thing"    Currently in Pain?  Yes    Pain Score  7     Pain Location  Knee    Pain Orientation  Left;Right                       [facility] Adult PT Treatment/Exercise - 05/17/18 0001      Lumbar Exercises: Aerobic   Nustep  L1 x 4 min       Lumbar Exercises: Seated   Long Arc Quad on  Chair  Both;2 sets;10 reps    Other Seated Lumbar Exercises  Yellow Tband rows 2x10     Other Seated Lumbar Exercises  seated chest pressed & bilat flex 1.5ln on cane 2x10       Modalities   Modalities  Electrical Stimulation;Moist Heat      Moist Heat Therapy   Number Minutes Moist Heat  15 Minutes    Moist Heat Location  Lumbar Spine      Electrical Stimulation   Electrical Stimulation Location  C/T area    Electrical Stimulation Action  IFC    Electrical Stimulation Parameters  sitting    Electrical Stimulation Goals  Pain               PT Short Term Goals - 05/06/18 1441      PT SHORT TERM GOAL #1   Title  independent with initial HEP    Time  2    Period  Weeks    Status  Achieved        PT Long Term Goals - 05/06/18 1441      PT LONG TERM GOAL #1   Title  understand proper body mechanics for ADL's    Time  8    Period  Weeks    Status  New      PT LONG TERM GOAL #2   Title  increase lumbar ROM 25%    Period  Weeks    Status  New      PT LONG TERM GOAL #3   Title  decrease pain 50%    Time  8    Period  Weeks    Status  New      PT LONG TERM GOAL #4   Title  report able to do her normal housework without increase pain    Time  8    Period  Weeks    Status  New      PT LONG TERM GOAL #5   Title  patietn will increase knee AROM to 100 degrees flexion    Time  8    Period  Weeks    Status  New            Plan - 05/17/18 1057    Clinical Impression Statement  Pt ~ 10 minutes late for today's treatment session. Reports some bilat knee pain entering clinic so NuStep was done in a limited range of motion. No issues reported with LE exercises. Marching ~ to be more taxing on patient. Reports some upper trap tightness & pulling with Tband rows.     Rehab Potential  Good    PT Frequency  2x / week    PT Duration  8 weeks    PT Next Visit Plan  exercises as tolerated       Patient will benefit from skilled therapeutic intervention in  order to improve the following deficits and impairments:  Abnormal gait, Decreased range of motion, Difficulty walking, Increased muscle spasms, Pain, Impaired flexibility, Improper body mechanics, Decreased strength, Decreased mobility, Obesity  Visit Diagnosis: Acute bilateral low back pain without sciatica  Cervicalgia  Chronic pain of left knee  Chronic pain of right knee  Difficulty in walking, not elsewhere classified  Lymphedema, not elsewhere classified     Problem List Patient Active Problem List   Diagnosis Date Noted  . Acute congestive heart failure with left ventricular diastolic dysfunction ([place]) 05/15/2018  . Dyspnea 05/15/2018  . Pulmonary nodules 05/11/2018  . Hypertensive urgency 05/11/2018  . Acute respiratory failure with hypoxia ([place]) 05/10/2018  . 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 DDD (degenerative disc disease), lumbar 03/21/2018  . Primary osteoarthritis of both knees 03/21/2018  . Dysphagia 03/19/2018  . PVC (premature ventricular contraction) 03/12/2018  . LVH (left ventricular hypertrophy) 03/12/2018  . Generalized edema 03/12/2018  . Bilateral carotid artery stenosis 03/12/2018  . Weight gain 01/25/2018  . Hemorrhoids 01/05/2018  . Generalized postprandial abdominal pain 01/05/2018  . Hiatal hernia 01/05/2018  . Esophageal dysmotilities 01/05/2018  . Mixed hyperlipidemia 09/12/2017  . Multinodular goiter 07/27/2017  . Hyperparathyroidism ([place]) 06/13/2017  . Vitamin D deficiency 06/13/2017  . Hx of adenomatous colonic polyps 05/15/2017  . Eustachian tube dysfunction, right 05/15/2017  . Lymphatic edema 05/15/2017  . Hypertension 05/14/2017  . Anemia 05/11/2017  . Arrhythmia 05/11/2017  . Diverticulosis 05/11/2017  . GERD (gastroesophageal reflux disease) 05/11/2017  . Hypothyroidism 05/11/2017  . Lower extremity edema 05/11/2017  . Morbid  obesity ([place]) 05/11/2017  . Polycystic ovary syndrome 05/11/2017   . OSA on CPAP 05/11/2017  . Neck pain 05/11/2017  . Allergic rhinitis 05/11/2017  . 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

## 2018-05-20 ENCOUNTER — Encounter: Payer: Self-pay | Admitting: Physical Therapy

## 2018-05-20 ENCOUNTER — Ambulatory Visit: Payer: BLUE CROSS/BLUE SHIELD | Admitting: Physical Therapy

## 2018-05-20 DIAGNOSIS — M25561 Pain in right knee: Secondary | ICD-10-CM

## 2018-05-20 DIAGNOSIS — M545 Low back pain, unspecified: Secondary | ICD-10-CM

## 2018-05-20 DIAGNOSIS — G8929 Other chronic pain: Secondary | ICD-10-CM

## 2018-05-20 DIAGNOSIS — M25562 Pain in left knee: Secondary | ICD-10-CM

## 2018-05-20 DIAGNOSIS — M542 Cervicalgia: Secondary | ICD-10-CM

## 2018-05-20 DIAGNOSIS — R262 Difficulty in walking, not elsewhere classified: Secondary | ICD-10-CM

## 2018-05-20 NOTE — Therapy (Signed)
[facility] [place], [place], [address] Phone: [phone]   Fax:  [phone]  Physical Therapy Treatment  Patient Details  Name: Madison Palmer MRN: [id number] Date of Birth: 04/27/1966 Referring Provider (PT): Mardelle Matte   Encounter Date: 05/20/2018  PT End of Session - 05/20/18 1427    Visit Number  3    Date for PT Re-Evaluation  07/05/18    PT Start Time  7035    PT Stop Time  1443    PT Time Calculation (min)  54 min    Activity Tolerance  Patient tolerated treatment well    Behavior During Therapy  [facility] for tasks assessed/performed       Past Medical History:  Diagnosis Date  . Anemia   . Anxiety   . Arrhythmia    tachycardia  . Arthritis   . Chickenpox   . Depression   . Diverticulitis   . GERD (gastroesophageal reflux disease)   . Glaucoma   . Hyperlipidemia   . Hyperparathyroidism ([place])   . Hypertension   . Inflammatory polyps of colon ([place])   . LVH (left ventricular hypertrophy)   . Lymphedema   . PCOS (polycystic ovarian syndrome)   . Prediabetes   . Recurrent UTI   . Sleep apnea    CPAP  . Thyroid disease   . Vitamin D deficiency     Past Surgical History:  Procedure Laterality Date  . BREAST BIOPSY  2015  . CESAREAN SECTION  2004  . INNER EAR SURGERY     ear and sinus surgery  . LAPAROSCOPIC REPAIR AND REMOVAL OF GASTRIC BAND    . OOPHORECTOMY Left   . TONSILLECTOMY AND ADENOIDECTOMY      There were no vitals filed for this visit.  Subjective Assessment - 05/20/18 1358    Subjective  "so far so good"    Currently in Pain?  No/denies    Pain Score  0-No pain                       [facility] Adult PT Treatment/Exercise - 05/20/18 0001      Lumbar Exercises: Aerobic   Nustep  L3 x 6 min       Lumbar Exercises: Seated   Long Arc Quad on Chair  Both;2 sets;10 reps    LAQ on Chair Weights (lbs)  2    Other Seated Lumbar Exercises  Seated march 2lb  2x10. HS curls yellow Tband 2x15, Tband Rows & extyellow 2x15    Other Seated Lumbar Exercises  seated chest pressed & bilat flex 2lb on cane 2x10       Modalities   Modalities  Electrical Stimulation;Moist Heat      Moist Heat Therapy   Number Minutes Moist Heat  15 Minutes    Moist Heat Location  Cervical      Electrical Stimulation   Electrical Stimulation Location  C/T area    Electrical Stimulation Action  IFC    Electrical Stimulation Parameters  sitting     Electrical Stimulation Goals  Pain               PT Short Term Goals - 05/20/18 1429      PT SHORT TERM GOAL #1   Title  independent with initial HEP    Status  Achieved        PT Long Term Goals - 05/20/18 1434  PT LONG TERM GOAL #1   Title  understand proper body mechanics for ADL's    Status  On-going      PT LONG TERM GOAL #2   Title  increase lumbar ROM 25%    Status  On-going            Plan - 05/20/18 1427    Clinical Impression Statement  Pt reports that she was surprised that she did not hurt from last session. She tolerated today's activities fair. LAQ with 2lb cuff weight causes a little R knee pain. 2lb marching was taxing on patient, cues required to get pt to breath. Some upper trap tightness noted with rows and extensions. She does stated that's her shoulder and knee pain isn't more than it usually is.     Rehab Potential  Good    PT Frequency  2x / week    PT Duration  8 weeks    PT Treatment/Interventions  ADLs/Self Care Home Management;Cryotherapy;Electrical Stimulation;Moist Heat;Functional mobility training;Therapeutic activities;Therapeutic exercise;Balance training;Neuromuscular re-education;Manual techniques;Patient/family education;Dry needling    PT Next Visit Plan  exercises as tolerated       Patient will benefit from skilled therapeutic intervention in order to improve the following deficits and impairments:  Abnormal gait, Decreased range of motion, Difficulty  walking, Increased muscle spasms, Pain, Impaired flexibility, Improper body mechanics, Decreased strength, Decreased mobility, Obesity  Visit Diagnosis: Acute bilateral low back pain without sciatica  Cervicalgia  Chronic pain of left knee  Chronic pain of right knee  Difficulty in walking, not elsewhere classified     Problem List Patient Active Problem List   Diagnosis Date Noted  . Acute congestive heart failure with left ventricular diastolic dysfunction ([place]) 05/15/2018  . Dyspnea 05/15/2018  . Pulmonary nodules 05/11/2018  . Hypertensive urgency 05/11/2018  . Acute respiratory failure with hypoxia ([place]) 05/10/2018  . 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 DDD (degenerative disc disease), lumbar 03/21/2018  . Primary osteoarthritis of both knees 03/21/2018  . Dysphagia 03/19/2018  . PVC (premature ventricular contraction) 03/12/2018  . LVH (left ventricular hypertrophy) 03/12/2018  . Generalized edema 03/12/2018  . Bilateral carotid artery stenosis 03/12/2018  . Weight gain 01/25/2018  . Hemorrhoids 01/05/2018  . Generalized postprandial abdominal pain 01/05/2018  . Hiatal hernia 01/05/2018  . Esophageal dysmotilities 01/05/2018  . Mixed hyperlipidemia 09/12/2017  . Multinodular goiter 07/27/2017  . Hyperparathyroidism ([place]) 06/13/2017  . Vitamin D deficiency 06/13/2017  . Hx of adenomatous colonic polyps 05/15/2017  . Eustachian tube dysfunction, right 05/15/2017  . Lymphatic edema 05/15/2017  . Hypertension 05/14/2017  . Anemia 05/11/2017  . Arrhythmia 05/11/2017  . Diverticulosis 05/11/2017  . GERD (gastroesophageal reflux disease) 05/11/2017  . Hypothyroidism 05/11/2017  . Lower extremity edema 05/11/2017  . Morbid obesity ([place]) 05/11/2017  . Polycystic ovary syndrome 05/11/2017  . OSA on CPAP 05/11/2017  . Neck pain 05/11/2017  . Allergic rhinitis 05/11/2017  . 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

## 2018-05-21 ENCOUNTER — Encounter: Payer: Self-pay | Admitting: Nurse Practitioner

## 2018-05-21 ENCOUNTER — Ambulatory Visit: Payer: BLUE CROSS/BLUE SHIELD | Admitting: Nurse Practitioner

## 2018-05-21 ENCOUNTER — Ambulatory Visit: Payer: BLUE CROSS/BLUE SHIELD | Attending: Nurse Practitioner | Admitting: Occupational Therapy

## 2018-05-21 VITALS — BP 162/100 | HR 74 | Temp 98.2°F | Ht 62.0 in | Wt 336.6 lb

## 2018-05-21 DIAGNOSIS — R0989 Other specified symptoms and signs involving the circulatory and respiratory systems: Secondary | ICD-10-CM

## 2018-05-21 DIAGNOSIS — I890 Lymphedema, not elsewhere classified: Secondary | ICD-10-CM | POA: Diagnosis present

## 2018-05-21 DIAGNOSIS — R8290 Unspecified abnormal findings in urine: Secondary | ICD-10-CM

## 2018-05-21 DIAGNOSIS — J9601 Acute respiratory failure with hypoxia: Secondary | ICD-10-CM

## 2018-05-21 DIAGNOSIS — R05 Cough: Secondary | ICD-10-CM | POA: Diagnosis not present

## 2018-05-21 DIAGNOSIS — I5031 Acute diastolic (congestive) heart failure: Secondary | ICD-10-CM

## 2018-05-21 DIAGNOSIS — R059 Cough, unspecified: Secondary | ICD-10-CM

## 2018-05-21 LAB — BASIC METABOLIC PANEL
BUN: 27 mg/dL — AB (ref 6–23)
CO2: 39 mEq/L — ABNORMAL HIGH (ref 19–32)
CREATININE: 1.11 mg/dL (ref 0.40–1.20)
Calcium: 10.5 mg/dL (ref 8.4–10.5)
Chloride: 99 mEq/L (ref 96–112)
GFR: 51.59 mL/min — ABNORMAL LOW (ref >60.00)
Glucose, Bld: 98 mg/dL (ref 70–99)
Potassium: 4 mEq/L (ref 3.5–5.1)
Sodium: 143 mEq/L (ref 135–145)

## 2018-05-21 LAB — POCT URINALYSIS DIPSTICK
BILIRUBIN UA: NEGATIVE
Blood, UA: NEGATIVE
Glucose, UA: NEGATIVE
Ketones, UA: NEGATIVE
Leukocytes, UA: NEGATIVE
Nitrite, UA: NEGATIVE
PH UA: 6.5 (ref 5.0–8.0)
Protein, UA: NEGATIVE
Spec Grav, UA: 1.02 (ref 1.010–1.025)
UROBILINOGEN UA: 1 U/dL

## 2018-05-21 MED ORDER — BENZONATATE 100 MG PO CAPS: 100.0000 mg | ORAL_CAPSULE | Freq: Three times a day (TID) | ORAL | 0 refills | Status: DC | PRN

## 2018-05-21 NOTE — Patient Instructions (Addendum)
Normal urinalysis.  Completed form for home O2 by [employer]. Entered referral to resume occupational therapy. Use benzonatate for cough as needed. BMP indicates stable electrolytes with mild decline in renal function due to use of furosemide. Need to decrease furosemide to 40mg  once a day. Need to return to lab in 6month for repeat BMP. F/up with me in 55months.

## 2018-05-21 NOTE — Progress Notes (Signed)
Subjective:  Patient ID: Madison Palmer, female    DOB: Sep 09, 1965  Age: 53 y.o. MRN: [id number]  CC: Hospitalization Follow-up (trying to get adjusted/ has some questions for you to f/u on.)  HPI Hospital stay  01/11-01/13 for acute respiratory failure with hypoxia and Hypertensive Urgency. Madison Palmer was sent to hospital due to hypoxia and positive D-dimer. During hospitalization she was found to also have pulmonary nodules which will further be evaluated with PET scan. It is scheduled for 05/24/2018. She also has an upcoming appt with Dr. Elsworth Soho: pulmonology 05/29/2018. She already had an appt with Dr. Earney Hamburg: cardiology 05/15/2018. Normal myocardial amyloid imaging. She was discharged home with oxygen 2L per nasal cannula and furosemide. She is requesting for a portable oxygen device ([employer]) and home oxygen concertrator from [place]. She states [facility] was not about to provide service. Current oxygen tanks is cumbersome. She is having difficulty carrying current tank and using Madison Palmer cane. She has lost 5lbs with furosemide since hospital discharge with furosemide. She reports persistent cough (nonproductive). Has intermittent dizziness with rapid head movement and bending forward. She noticed abnormal urine odor without any dysuria, or fever or vaginal symptoms or ABD pain.   Needs new referral to OT for lymphedema due to recent hospitalization.  Reviewed radiology report and lab results from recent hospitalization.  Reviewed past Medical, Social and Family history today.  Outpatient Medications Prior to Visit  Medication Sig Dispense Refill  . acetaminophen (TYLENOL) 500 MG tablet Take 1,000 mg by mouth 3 (three) times daily as needed for moderate pain or headache.    Marland Kitchen atenolol (TENORMIN) 25 MG tablet Take 25 mg by mouth daily.   0  . cetirizine (ZYRTEC ALLERGY) 10 MG tablet Take 1 tablet (10 mg total) by mouth daily. (Patient taking differently: Take 10 mg by mouth as needed for  allergies. Use as needed for allergies) 30 tablet 5  . esomeprazole (NEXIUM) 40 MG capsule TAKE ONE CAPSULE BY MOUTH TWICE DAILY 180 capsule 0  . fluticasone (FLONASE) 50 MCG/ACT nasal spray Place 2 sprays into both nostrils daily.    . hydrALAZINE (APRESOLINE) 50 MG tablet Take 1tab in morning and afternoon, and 2tabs at bedtime 120 tablet 6  . levothyroxine (SYNTHROID, LEVOTHROID) 75 MCG tablet TK 1 T PO QD  MONDAY-FRIDAY  0  . lisinopril (PRINIVIL,ZESTRIL) 10 MG tablet Take 1 tablet (10 mg total) by mouth daily. 30 tablet 0  . Vitamin D, Ergocalciferol, (DRISDOL) 1.25 MG (50000 UT) CAPS capsule Take 1 capsule (50,000 Units total) by mouth every 7 (seven) days. 4 capsule 0  . benzonatate (TESSALON) 100 MG capsule Take 1 capsule (100 mg total) by mouth 3 (three) times daily as needed for cough. 20 capsule 0  . furosemide (LASIX) 40 MG tablet Take 1 tablet (40 mg total) by mouth 2 (two) times daily. 60 tablet 0  . potassium chloride SA (K-DUR,KLOR-CON) 20 MEQ tablet Take 1 tablet (20 mEq total) by mouth daily. 30 tablet 1  . metFORMIN (GLUCOPHAGE) 500 MG tablet Take 1 tablet (500 mg total) by mouth daily with breakfast. 30 tablet 0   No facility-administered medications prior to visit.     ROS See HPI  Objective:  BP (!) 162/100   Pulse 74   Temp 98.2 F (36.8 C) (Oral)   Ht 5\' 2"  (1.575 m)   Wt (!) 336 lb 9.6 oz (152.7 kg)   SpO2 96%   BMI 61.56 kg/m   BP Readings from  Last 3 Encounters:  05/21/18 (!) 162/100  05/15/18 (!) 174/96  05/13/18 (!) 144/74    Wt Readings from Last 3 Encounters:  05/21/18 (!) 336 lb 9.6 oz (152.7 kg)  05/15/18 (!) 343 lb (155.6 kg)  05/14/18 (!) 341 lb (154.7 kg)   O2 Sat at rest 93% RA, and 85%RA while ambulation.  Physical Exam Cardiovascular:     Rate and Rhythm: Normal rate and regular rhythm.     Pulses: Normal pulses.     Heart sounds: Normal heart sounds.  Pulmonary:     Effort: Pulmonary effort is normal.     Breath sounds: Normal  breath sounds.  Musculoskeletal:     Right lower leg: Edema present.     Left lower leg: Edema present.  Neurological:     Mental Status: She is alert and oriented to person, place, and time.    Lab Results  Component Value Date   WBC 6.8 05/13/2018   HGB 12.9 05/13/2018   HCT 43.5 05/13/2018   PLT 253 05/13/2018   GLUCOSE 98 05/21/2018   CHOL 196 02/27/2018   TRIG 142 02/27/2018   HDL 46 02/27/2018   LDLCALC 122 (H) 02/27/2018   ALT 39 05/11/2018   AST 25 05/11/2018   NA 143 05/21/2018   K 4.0 05/21/2018   CL 99 05/21/2018   CREATININE 1.11 05/21/2018   BUN 27 (H) 05/21/2018   CO2 39 (H) 05/21/2018   TSH 0.938 05/11/2018   HGBA1C 6.0 (H) 02/27/2018    Assessment & Plan:   Madison Palmer was seen today for hospitalization follow-up.  Diagnoses and all orders for this visit:  Acute respiratory failure with hypoxia (HCC)  Abnormal urine odor -     POCT urinalysis dipstick  Acute congestive heart failure with left ventricular diastolic dysfunction (HCC) -     Basic metabolic panel -     Basic metabolic panel; Future  Cough -     benzonatate (TESSALON) 100 MG capsule; Take 1 capsule (100 mg total) by mouth 3 (three) times daily as needed for cough.  Lymphatic edema -     Ambulatory referral to Occupational Therapy  Pulmonary vascular congestion -     furosemide (LASIX) 40 MG tablet; Take 1 tablet (40 mg total) by mouth daily. -     potassium chloride SA (K-DUR,KLOR-CON) 20 MEQ tablet; Take 1 tablet (20 mEq total) by mouth daily.   I have changed Madison Palmer's furosemide. I am also having Madison Palmer maintain Madison Palmer atenolol, fluticasone, hydrALAZINE, levothyroxine, cetirizine, metFORMIN, esomeprazole, acetaminophen, Vitamin D (Ergocalciferol), lisinopril, benzonatate, and potassium chloride SA.  Meds ordered this encounter  Medications  . benzonatate (TESSALON) 100 MG capsule    Sig: Take 1 capsule (100 mg total) by mouth 3 (three) times daily as needed for cough.     Dispense:  20 capsule    Refill:  0    Order Specific Question:   Supervising Provider    Answer:   Lucille Passy [[id number]]  . furosemide (LASIX) 40 MG tablet    Sig: Take 1 tablet (40 mg total) by mouth daily.    Dispense:  90 tablet    Refill:  0    Order Specific Question:   Supervising Provider    Answer:   Lucille Passy [[id number]]  . potassium chloride SA (K-DUR,KLOR-CON) 20 MEQ tablet    Sig: Take 1 tablet (20 mEq total) by mouth daily.    Dispense:  90 tablet    Refill:  0    Order Specific Question:   Supervising Provider    Answer:   Lucille Passy [[id number]]    Problem List Items Addressed This Visit      Cardiovascular and Mediastinum   Acute congestive heart failure with left ventricular diastolic dysfunction (HCC)   Relevant Medications   furosemide (LASIX) 40 MG tablet   Other Relevant Orders   Basic metabolic panel (Completed)   Basic metabolic panel     Respiratory   Acute respiratory failure with hypoxia (HCC) - Primary     Other   Lymphatic edema   Relevant Orders   Ambulatory referral to Occupational Therapy    Other Visit Diagnoses    Abnormal urine odor       Relevant Orders   POCT urinalysis dipstick (Completed)   Cough       Relevant Medications   benzonatate (TESSALON) 100 MG capsule   Pulmonary vascular congestion       Relevant Medications   furosemide (LASIX) 40 MG tablet   potassium chloride SA (K-DUR,KLOR-CON) 20 MEQ tablet       Follow-up: Return in about 3 months (around 08/20/2018) for Hypothyroidism.  Wilfred Lacy, NP

## 2018-05-22 ENCOUNTER — Encounter: Payer: Self-pay | Admitting: Nurse Practitioner

## 2018-05-22 ENCOUNTER — Encounter: Payer: Self-pay | Admitting: Physical Therapy

## 2018-05-22 ENCOUNTER — Ambulatory Visit: Payer: BLUE CROSS/BLUE SHIELD | Admitting: Physical Therapy

## 2018-05-22 DIAGNOSIS — M25561 Pain in right knee: Secondary | ICD-10-CM

## 2018-05-22 DIAGNOSIS — M545 Low back pain, unspecified: Secondary | ICD-10-CM

## 2018-05-22 DIAGNOSIS — M25562 Pain in left knee: Secondary | ICD-10-CM

## 2018-05-22 DIAGNOSIS — M542 Cervicalgia: Secondary | ICD-10-CM

## 2018-05-22 DIAGNOSIS — I890 Lymphedema, not elsewhere classified: Secondary | ICD-10-CM

## 2018-05-22 DIAGNOSIS — G8929 Other chronic pain: Secondary | ICD-10-CM

## 2018-05-22 MED ORDER — POTASSIUM CHLORIDE CRYS ER 20 MEQ PO TBCR: 20.0000 meq | EXTENDED_RELEASE_TABLET | Freq: Every day | ORAL | 0 refills | Status: DC

## 2018-05-22 MED ORDER — FUROSEMIDE 40 MG PO TABS: 40.0000 mg | ORAL_TABLET | Freq: Every day | ORAL | 0 refills | Status: DC

## 2018-05-22 NOTE — Therapy (Signed)
[place] MAIN [facility] SERVICES [address] [place], [place], [address] Phone: [phone]   Fax:  [phone]  Occupational Therapy Re-Evaluation, Treatment Note and Progress Report  Lymphedema Care  Patient Details  Name: Madison Palmer MRN: [id number] Date of Birth: 1966-01-04 Referring Provider (OT): 2004   Encounter Date: 05/21/2018  OT End of Session - 05/22/18 1229    Behavior During Therapy  WFL for tasks assessed/performed       Past Medical History:  Diagnosis Date  . Anemia   . Anxiety   . Arrhythmia    tachycardia  . Arthritis   . Chickenpox   . Depression   . Diverticulitis   . GERD (gastroesophageal reflux disease)   . Glaucoma   . Hyperlipidemia   . Hyperparathyroidism ([place])   . Hypertension   . Inflammatory polyps of colon ([place])   . LVH (left ventricular hypertrophy)   . Lymphedema   . PCOS (polycystic ovarian syndrome)   . Prediabetes   . Recurrent UTI   . Sleep apnea    CPAP  . Thyroid disease   . Vitamin D deficiency     Past Surgical History:  Procedure Laterality Date  . BREAST BIOPSY  2015  . CESAREAN SECTION  2004  . INNER EAR SURGERY     ear and sinus surgery  . LAPAROSCOPIC REPAIR AND REMOVAL OF GASTRIC BAND    . OOPHORECTOMY Left   . TONSILLECTOMY AND ADENOIDECTOMY      There were no vitals filed for this visit.  Subjective Assessment - 05/22/18 1228    Subjective   Madison Palmer returns to OT today for OT visit 2/4 for custom compression garment fitting.Pt is accompanied by her son. Pt reports that she received garments more than 10 days ago. She feels they may be a little big at the top edge because they tend to slide down.. Pt also reports she has lost 9 pounds since last visit as she started attending a weight management program recently.    Pertinent History  morbid obesity, OA, OSA, BLE lymphedema, Hypothytoidism, HTN, Cardiac test results pending    Limitations  difficulty walking,  impaired functional mobility and transfers, requires assistive devices to perform personal care and most basic ADLs, impaired instrumental ADLs, limited abiility to work and perform productive activities, decreased social participation and leisure pursuits, impaired body image    Repetition  Increases Symptoms    Pain Onset  More than a month ago                   OT Treatments/Exercises (OP) - 05/22/18 0001      ADLs   ADL Education Given  Yes      Manual Therapy   Manual Therapy  Edema management;Compression Bandaging    Manual therapy comments  completed fitting for replacememnt HOS devices to limit fibrosis formation. Replacement SOlaris qquilted knee boots are too large and must be modified to fit and function properly. Repeated BLE anatomical measurements and faxed to vendor.    Compression Bandaging  Pt requires Max A to daon custom knee length cmopression stockings at end of session. She is not able to bend at hips to reach her feet today to don or doff cmopression garments or devices,.             OT Education - 05/22/18 1241    Education Details  Continued skilled Pt/caregiver education  And LE ADL training throughout visit for lymphedema  self care/ home program, including compression wrapping, compression garment and device wear/care, lymphatic pumping ther ex, simple self-MLD, and skin care. Discussed progress towards goals.     Person(s) Educated  Patient    Methods  Explanation;Demonstration    Comprehension  Verbalized understanding;Returned demonstration          OT Long Term Goals - 05/20/18 1252      OT LONG TERM GOAL #1   Title  LE SELF CARE: Pt will ahieve modified independence with donning and doffing custom, knee length compression stockings using assistive devices and extra time at  garment fitting after skilled LE self-care education.    Baseline  Max A    Time  1    Period  Days    Status  On-going              Patient will  benefit from skilled therapeutic intervention in order to improve the following deficits and impairments:  Abnormal gait, Decreased balance, Decreased mobility, Decreased skin integrity, Difficulty walking, Obesity, Decreased range of motion, Increased edema, Pain, Other (comment), Decreased knowledge of use of DME, Impaired flexibility, Decreased knowledge of precautions  Visit Diagnosis: Lymphedema, not elsewhere classified    Problem List Patient Active Problem List   Diagnosis Date Noted  . Acute congestive heart failure with left ventricular diastolic dysfunction ([place]) 05/15/2018  . Dyspnea 05/15/2018  . Pulmonary nodules 05/11/2018  . Hypertensive urgency 05/11/2018  . Acute respiratory failure with hypoxia ([place]) 05/10/2018  . 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 DDD (degenerative disc disease), lumbar 03/21/2018  . Primary osteoarthritis of both knees 03/21/2018  . Dysphagia 03/19/2018  . PVC (premature ventricular contraction) 03/12/2018  . LVH (left ventricular hypertrophy) 03/12/2018  . Generalized edema 03/12/2018  . Bilateral carotid artery stenosis 03/12/2018  . Weight gain 01/25/2018  . Hemorrhoids 01/05/2018  . Generalized postprandial abdominal pain 01/05/2018  . Hiatal hernia 01/05/2018  . Esophageal dysmotilities 01/05/2018  . Mixed hyperlipidemia 09/12/2017  . Multinodular goiter 07/27/2017  . Hyperparathyroidism ([place]) 06/13/2017  . Vitamin D deficiency 06/13/2017  . Hx of adenomatous colonic polyps 05/15/2017  . Eustachian tube dysfunction, right 05/15/2017  . Lymphatic edema 05/15/2017  . Hypertension 05/14/2017  . Anemia 05/11/2017  . Arrhythmia 05/11/2017  . Diverticulosis 05/11/2017  . GERD (gastroesophageal reflux disease) 05/11/2017  . Hypothyroidism 05/11/2017  . Lower extremity edema 05/11/2017  . Morbid obesity ([place]) 05/11/2017  . Polycystic ovary syndrome 05/11/2017  . OSA on CPAP 05/11/2017  . Neck pain  05/11/2017  . Allergic rhinitis 05/11/2017  . Ossification of posterior longitudinal ligament ([place]) 08/28/2012    Andrey Spearman, MS, OTR/L, [facility] 05/22/18 12:55 PM  [place] MAIN [facility] SERVICES [address] [place], [place], [address] Phone: [phone]   Fax:  [phone]  Name: Madison Palmer MRN: [id number] Date of Birth: Mar 22, 1966

## 2018-05-22 NOTE — Therapy (Signed)
[place], [place], [address] Phone: [phone]   Fax:  [phone]  Physical Therapy Treatment  Patient Details  Name: Madison Palmer MRN: [id number] Date of Birth: [birth year] Referring Provider (PT): Mardelle Matte   Encounter Date: 05/22/2018  PT End of Session - 05/22/18 1431    Visit Number  4    Date for PT Re-Evaluation  07/05/18    PT Start Time  1351    PT Stop Time  1440    PT Time Calculation (min)  49 min    Activity Tolerance  Patient tolerated treatment well    Behavior During Therapy  [facility] for tasks assessed/performed       Past Medical History:  Diagnosis Date  . Anemia   . Anxiety   . Arrhythmia    tachycardia  . Arthritis   . Chickenpox   . Depression   . Diverticulitis   . GERD (gastroesophageal reflux disease)   . Glaucoma   . Hyperlipidemia   . Hyperparathyroidism ([place])   . Hypertension   . Inflammatory polyps of colon ([place])   . LVH (left ventricular hypertrophy)   . Lymphedema   . PCOS (polycystic ovarian syndrome)   . Prediabetes   . Recurrent UTI   . Sleep apnea    CPAP  . Thyroid disease   . Vitamin D deficiency     Past Surgical History:  Procedure Laterality Date  . BREAST BIOPSY  2015  . CESAREAN SECTION  2004  . INNER EAR SURGERY     ear and sinus surgery  . LAPAROSCOPIC REPAIR AND REMOVAL OF GASTRIC BAND    . OOPHORECTOMY Left   . TONSILLECTOMY AND ADENOIDECTOMY      There were no vitals filed for this visit.  Subjective Assessment - 05/22/18 1358    Subjective  "I feel all right"    Currently in Pain?  Yes    Pain Score  7     Pain Location  Knee    Pain Orientation  Right;Left                       [facility] Adult PT Treatment/Exercise - 05/22/18 1414      Lumbar Exercises: Seated   Other Seated Lumbar Exercises  Yellow tband HS curls 2x10, LAQ 2lb 2x10, Seated march 2lb 3x10     Other Seated Lumbar Exercises  Yellow  Tband rows 2x10, Horiz abd yellow tband x10, OHP yello wball 2x10       Modalities   Modalities  Electrical Stimulation;Moist Heat      Moist Heat Therapy   Number Minutes Moist Heat  15 Minutes    Moist Heat Location  Cervical;Knee      Electrical Stimulation   Electrical Stimulation Location  cervical and L knee    Electrical Stimulation Action  premod    Electrical Stimulation Parameters  sitting    Electrical Stimulation Goals  Pain               PT Short Term Goals - 05/20/18 1429      PT SHORT TERM GOAL #1   Title  independent with initial HEP    Status  Achieved        PT Long Term Goals - 05/20/18 1434      PT LONG TERM GOAL #1   Title  understand proper body mechanics for ADL's    Status  On-going      PT LONG TERM GOAL #2   Title  increase lumbar ROM 25%    Status  On-going            Plan - 05/22/18 1433    Clinical Impression Statement  Pt ~ 6 minutes late for today's session. She does reports mor knee pain than usual on NuStep warm up. Despite reports of pain she did well with both LAQ and HS curls. Again 2lb marching was taxing on patient.     PT Frequency  2x / week    PT Duration  8 weeks    PT Treatment/Interventions  ADLs/Self Care Home Management;Cryotherapy;Electrical Stimulation;Moist Heat;Functional mobility training;Therapeutic activities;Therapeutic exercise;Balance training;Neuromuscular re-education;Manual techniques;Patient/family education;Dry needling    PT Next Visit Plan  exercises as tolerated       Patient will benefit from skilled therapeutic intervention in order to improve the following deficits and impairments:  Abnormal gait, Decreased range of motion, Difficulty walking, Increased muscle spasms, Pain, Impaired flexibility, Improper body mechanics, Decreased strength, Decreased mobility, Obesity  Visit Diagnosis: Lymphedema, not elsewhere classified  Acute bilateral low back pain without  sciatica  Cervicalgia  Chronic pain of right knee  Chronic pain of left knee     Problem List Patient Active Problem List   Diagnosis Date Noted  . Acute congestive heart failure with left ventricular diastolic dysfunction ([place]) 05/15/2018  . Dyspnea 05/15/2018  . Pulmonary nodules 05/11/2018  . Hypertensive urgency 05/11/2018  . Acute respiratory failure with hypoxia ([place]) 05/10/2018  . 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 DDD (degenerative disc disease), lumbar 03/21/2018  . Primary osteoarthritis of both knees 03/21/2018  . Dysphagia 03/19/2018  . PVC (premature ventricular contraction) 03/12/2018  . LVH (left ventricular hypertrophy) 03/12/2018  . Generalized edema 03/12/2018  . Bilateral carotid artery stenosis 03/12/2018  . Weight gain 01/25/2018  . Hemorrhoids 01/05/2018  . Generalized postprandial abdominal pain 01/05/2018  . Hiatal hernia 01/05/2018  . Esophageal dysmotilities 01/05/2018  . Mixed hyperlipidemia 09/12/2017  . Multinodular goiter 07/27/2017  . Hyperparathyroidism ([place]) 06/13/2017  . Vitamin D deficiency 06/13/2017  . Hx of adenomatous colonic polyps 05/15/2017  . Eustachian tube dysfunction, right 05/15/2017  . Lymphatic edema 05/15/2017  . Hypertension 05/14/2017  . Anemia 05/11/2017  . Arrhythmia 05/11/2017  . Diverticulosis 05/11/2017  . GERD (gastroesophageal reflux disease) 05/11/2017  . Hypothyroidism 05/11/2017  . Lower extremity edema 05/11/2017  . Morbid obesity ([place]) 05/11/2017  . Polycystic ovary syndrome 05/11/2017  . OSA on CPAP 05/11/2017  . Neck pain 05/11/2017  . Allergic rhinitis 05/11/2017  . Ossification of posterior longitudinal ligament ([place]) 08/28/2012    Madison Palmer 05/22/2018, 2:39 PM  [place] [address] [place], [place], [address] Phone: [phone]   Fax:  [phone]  Name: ANGELETTA GOELZ MRN:  [id number] Date of Birth: Sep 22, 1965

## 2018-05-24 ENCOUNTER — Encounter (HOSPITAL_COMMUNITY): Payer: Self-pay | Admitting: Radiology

## 2018-05-24 ENCOUNTER — Ambulatory Visit (HOSPITAL_COMMUNITY)
Admission: RE | Admit: 2018-05-24 | Discharge: 2018-05-24 | Disposition: A | Discharge status: Home / Self Care | Payer: BLUE CROSS/BLUE SHIELD | Source: Ambulatory Visit | Attending: Pulmonary Disease | Admitting: Pulmonary Disease

## 2018-05-24 DIAGNOSIS — R918 Other nonspecific abnormal finding of lung field: Secondary | ICD-10-CM | POA: Insufficient documentation

## 2018-05-24 LAB — GLUCOSE, CAPILLARY: Glucose-Capillary: 92 mg/dL (ref 70–99)

## 2018-05-24 MED ORDER — FLUDEOXYGLUCOSE F - 18 (FDG) INJECTION
14.5000 | Freq: Once | INTRAVENOUS | Status: AC | PRN
  Administered 2018-05-24: 14.5 via INTRAVENOUS

## 2018-05-28 ENCOUNTER — Encounter: Payer: Self-pay | Admitting: Nurse Practitioner

## 2018-05-29 ENCOUNTER — Ambulatory Visit: Payer: BLUE CROSS/BLUE SHIELD | Admitting: Pulmonary Disease

## 2018-05-29 ENCOUNTER — Encounter: Payer: Self-pay | Admitting: Pulmonary Disease

## 2018-05-29 ENCOUNTER — Encounter: Payer: Self-pay | Admitting: Nurse Practitioner

## 2018-05-29 DIAGNOSIS — G4733 Obstructive sleep apnea (adult) (pediatric): Secondary | ICD-10-CM

## 2018-05-29 DIAGNOSIS — J9611 Chronic respiratory failure with hypoxia: Secondary | ICD-10-CM | POA: Diagnosis not present

## 2018-05-29 DIAGNOSIS — D869 Sarcoidosis, unspecified: Secondary | ICD-10-CM | POA: Diagnosis not present

## 2018-05-29 DIAGNOSIS — I5032 Chronic diastolic (congestive) heart failure: Secondary | ICD-10-CM

## 2018-05-29 DIAGNOSIS — Z9989 Dependence on other enabling machines and devices: Secondary | ICD-10-CM

## 2018-05-29 DIAGNOSIS — R918 Other nonspecific abnormal finding of lung field: Secondary | ICD-10-CM

## 2018-05-29 MED ORDER — PREDNISONE 5 MG PO TABS: ORAL_TABLET | ORAL | 0 refills | Status: DC

## 2018-05-29 NOTE — Assessment & Plan Note (Signed)
Okay to stay off oxygen while at rest but she certainly needs it during exertion and sleep Will reassess after trial of steroids and further diuresis.

## 2018-05-29 NOTE — Assessment & Plan Note (Addendum)
She has lost 17 pounds since 12/2017 but only 4 pounds since hospital discharge So hypoxia could still be related to diastolic heart failure

## 2018-05-29 NOTE — Progress Notes (Signed)
Subjective:    Patient ID: Madison Palmer, female    DOB: 02/20/1966, 53 y.o.   MRN: [id number]  HPI  53 yo morbidly obese woman with chronic lymphedema  for FU of severe obstructive sleep apnea & pulm nodules. Noted 05/2018   She underwent lap band in 2009 and lost weight and CPAP was then decreased to 9 cm in 06/2009.lap band was removed in 2013.   She was admitted 05/10/18 for asymptomatic hypoxia with hypertensive urgency 200/100, diuresed but continues to require 1 L of oxygen.  Oxygen saturation today was between 89 to 92% on room air.  She has lost 17 pounds, Lasix was initially 40 mg twice daily but has now dropped to 40 mg once daily   She reports knowledge of pulmonary nodules and recounts a negative PET scan about 8 years ago at [place] in [place].   PET scan was reviewed which showed low-grade hypermetabolism SUV 3-4 range in the nodules.  Mediastinum did not show any hypermetabolic  Towards the end of the interview as we were discussing bronchoscopy, she reports a history of laryngospasm with prior surgery  Significant tests/ events reviewed  CT angiogram 05/10/18  was negative for pulmonary emboli, but showed numerous pulmonary nodules largest 15 mm in the right lower lobe and 13 mm in the lingula. Mosaic attenuation was also noted  suggesting air trapping    NP SG 2008 showed AHI of 100/hour which was corrected by CPAP of 14 cm.   rheumatology evaluation >> positive ANA and positive RA factor but negative CCP and no evidence of synovitis , ENA neg ACE 40  Past Medical History:  Diagnosis Date  . Anemia   . Anxiety   . Arrhythmia    tachycardia  . Arthritis   . Chickenpox   . Depression   . Diverticulitis   . GERD (gastroesophageal reflux disease)   . Glaucoma   . Hyperlipidemia   . Hyperparathyroidism ([place])   . Hypertension   . Inflammatory polyps of colon ([place])   . LVH (left ventricular hypertrophy)   . Lymphedema   . PCOS (polycystic ovarian  syndrome)   . Prediabetes   . Recurrent UTI   . Sleep apnea    CPAP  . Thyroid disease   . Vitamin D deficiency      Past Surgical History:  Procedure Laterality Date  . BREAST BIOPSY  2015  . CESAREAN SECTION  2004  . INNER EAR SURGERY     ear and sinus surgery  . LAPAROSCOPIC REPAIR AND REMOVAL OF GASTRIC BAND    . OOPHORECTOMY Left   . TONSILLECTOMY AND ADENOIDECTOMY       Review of Systems neg for any significant sore throat, dysphagia, itching, sneezing, nasal congestion or excess/ purulent secretions, fever, chills, sweats, unintended wt loss, pleuritic or exertional cp, hempoptysis, orthopnea pnd or change in chronic leg swelling. Also denies presyncope, palpitations, heartburn, abdominal pain, nausea, vomiting, diarrhea or change in bowel or urinary habits, dysuria,hematuria, rash, arthralgias, visual complaints, headache, numbness weakness or ataxia.     Objective:   Physical Exam  Gen. Pleasant, morbildly obese, in no distress, normal affect , on 1 L [facility] - no pallor,icterus, no post nasal drip, class 3 airway Neck: No JVD, no thyromegaly, no carotid bruits Lungs: no use of accessory muscles, no dullness to percussion, decreased without rales or rhonchi  Cardiovascular: Rhythm regular, heart sounds  normal, no murmurs or gallops, 2+ peripheral edema Abdomen: soft and non-tender,  no hepatosplenomegaly, BS normal. Musculoskeletal: No deformities, no cyanosis or clubbing Neuro:  alert, non focal, no tremors       Assessment & Plan:

## 2018-05-29 NOTE — Assessment & Plan Note (Signed)
Weight loss encouraged, compliance with goal of at least 4-6 hrs every night is the expectation. Advised against medications with sedative side effects Cautioned against driving when sleepy - understanding that sleepiness will vary on a day to day basis  

## 2018-05-29 NOTE — Assessment & Plan Note (Addendum)
We discussed possible biopsy -need for almost getting ready to proceed with bronchoscopy when towards the end of the interview she gave me a history of laryngospasm this appears to have been severe with prior surgery. We therefore agreed to hold off bronchoscopy for now due to  history of laryngospasm and undertake a short trial of steroids instead with repeat imaging She will also try to track down her prior CT scan from [place]  The various options of biopsy including bronchoscopy, CT guided needle aspiration and surgical biopsy were discussed.The risks of each procedure including coughing, bleeding and the  chances of lung puncture requiring chest tube were discussed in great detail. The benefits & alternatives including serial follow up were also discussed.   Differential diagnosis includes sarcoidosis and less likely cancer Trial of prednisone 20 mg for a week, then 15 mg for a week, then 10 mg and stay at this dose  We will plan on repeat CT chest without contrast in April

## 2018-05-29 NOTE — Patient Instructions (Signed)
We discussed possible biopsy and agreed to hold off due to your history of laryngospasm  Differential diagnosis includes sarcoidosis and less likely cancer Trial of prednisone 20 mg for a week, then 15 mg for a week, then 10 mg and stay at this dose  We will plan on repeat CT chest without contrast in April

## 2018-05-30 ENCOUNTER — Ambulatory Visit: Payer: BLUE CROSS/BLUE SHIELD | Admitting: Physical Therapy

## 2018-05-30 ENCOUNTER — Other Ambulatory Visit (INDEPENDENT_AMBULATORY_CARE_PROVIDER_SITE_OTHER): Payer: Self-pay | Admitting: Family Medicine

## 2018-05-30 ENCOUNTER — Encounter: Payer: Self-pay | Admitting: Physical Therapy

## 2018-05-30 ENCOUNTER — Ambulatory Visit (INDEPENDENT_AMBULATORY_CARE_PROVIDER_SITE_OTHER): Payer: BLUE CROSS/BLUE SHIELD | Admitting: Family Medicine

## 2018-05-30 VITALS — BP 183/97 | HR 75 | Temp 98.2°F | Ht 62.0 in | Wt 330.0 lb

## 2018-05-30 DIAGNOSIS — M545 Low back pain, unspecified: Secondary | ICD-10-CM

## 2018-05-30 DIAGNOSIS — M25561 Pain in right knee: Secondary | ICD-10-CM

## 2018-05-30 DIAGNOSIS — Z6841 Body Mass Index (BMI) 40.0 and over, adult: Secondary | ICD-10-CM

## 2018-05-30 DIAGNOSIS — I10 Essential (primary) hypertension: Secondary | ICD-10-CM | POA: Diagnosis not present

## 2018-05-30 DIAGNOSIS — J9601 Acute respiratory failure with hypoxia: Secondary | ICD-10-CM

## 2018-05-30 DIAGNOSIS — M542 Cervicalgia: Secondary | ICD-10-CM

## 2018-05-30 DIAGNOSIS — E559 Vitamin D deficiency, unspecified: Secondary | ICD-10-CM

## 2018-05-30 DIAGNOSIS — R262 Difficulty in walking, not elsewhere classified: Secondary | ICD-10-CM

## 2018-05-30 DIAGNOSIS — Z9189 Other specified personal risk factors, not elsewhere classified: Secondary | ICD-10-CM

## 2018-05-30 DIAGNOSIS — G8929 Other chronic pain: Secondary | ICD-10-CM

## 2018-05-30 DIAGNOSIS — M25562 Pain in left knee: Secondary | ICD-10-CM

## 2018-05-30 DIAGNOSIS — I890 Lymphedema, not elsewhere classified: Secondary | ICD-10-CM

## 2018-05-30 MED ORDER — VITAMIN D (ERGOCALCIFEROL) 1.25 MG (50000 UNIT) PO CAPS: 50000.0000 [IU] | ORAL_CAPSULE | ORAL | 0 refills | Status: DC

## 2018-05-30 NOTE — Therapy (Signed)
[facility] [address] [place], [place], [address] Phone: [phone]   Fax:  [phone]  Physical Therapy Treatment  Patient Details  Name: Madison Palmer MRN: [id number] Date of Birth: 03/22/1966 Referring Provider (PT): [place]   Encounter Date: 05/30/2018  PT End of Session - 05/30/18 1607    Visit Number  5    Date for PT Re-Evaluation  07/05/18    PT Start Time  1520    PT Stop Time  1616    PT Time Calculation (min)  56 min    Activity Tolerance  Patient tolerated treatment well    Behavior During Therapy  WFL for tasks assessed/performed       Past Medical History:  Diagnosis Date  . Anemia   . Anxiety   . Arrhythmia    tachycardia  . Arthritis   . Chickenpox   . Depression   . Diverticulitis   . GERD (gastroesophageal reflux disease)   . Glaucoma   . Hyperlipidemia   . Hyperparathyroidism (HCC)   . Hypertension   . Inflammatory polyps of colon (HCC)   . LVH (left ventricular hypertrophy)   . Lymphedema   . PCOS (polycystic ovarian syndrome)   . Prediabetes   . Recurrent UTI   . Sleep apnea    CPAP  . Thyroid disease   . Vitamin D deficiency     Past Surgical History:  Procedure Laterality Date  . BREAST BIOPSY  2015  . CESAREAN SECTION  2004  . INNER EAR SURGERY     ear and sinus surgery  . LAPAROSCOPIC REPAIR AND REMOVAL OF GASTRIC BAND    . OOPHORECTOMY Left   . TONSILLECTOMY AND ADENOIDECTOMY      There were no vitals filed for this visit.  Subjective Assessment - 05/30/18 1529    Subjective  Pt reports that she has been walking around her house more without the cane.     Currently in Pain?  Yes    Pain Score  5     Pain Location  Knee    Pain Orientation  Right;Left                       [facility] Adult PT Treatment/Exercise - 05/30/18 0001      Lumbar Exercises: Aerobic   UBE (Upper Arm Bike)  L1 2 min each     Nustep  L3 x5 min       Lumbar  Exercises: Seated   Other Seated Lumbar Exercises  Red tband HS curls 2x10, LAQ 2lb 2x10, Seated march 2lb 3x10     Other Seated Lumbar Exercises  Yellow Tband rows 2x10, Horiz abd yellow tband x10, OHP yello wball 2x10       Modalities   Modalities  Electrical Stimulation;Moist Heat      Moist Heat Therapy   Number Minutes Moist Heat  15 Minutes    Moist Heat Location  Cervical      Electrical Stimulation   Electrical Stimulation Location  cervical     Electrical Stimulation Action  IFC    Electrical Stimulation Parameters  sitting    Electrical Stimulation Goals  Pain               PT Short Term Goals - 05/20/18 1429      PT SHORT TERM GOAL #1   Title  independent with initial HEP    Status  Achieved          PT Long Term Goals - 05/30/18 1545      PT LONG TERM GOAL #1   Title  understand proper body mechanics for ADL's    Status  Partially Met      PT LONG TERM GOAL #3   Title  decrease pain 50%    Status  Partially Met      PT LONG TERM GOAL #4   Title  report able to do her normal housework without increase pain    Status  Partially Met      PT LONG TERM GOAL #5   Title  patietn will increase knee AROM to 100 degrees flexion    Status  On-going            Plan - 05/30/18 1607    Clinical Impression Statement  Pt with some increase in knee pain with aerobic NuStep warm up. she also reported some knee pain with seated LAQ. Pain stopped after activity. Anterior R shoulder while on UBE. She tolerated increase resistance with seated rows and HS curls.     Rehab Potential  Good    PT Treatment/Interventions  ADLs/Self Care Home Management;Cryotherapy;Electrical Stimulation;Moist Heat;Functional mobility training;Therapeutic activities;Therapeutic exercise;Balance training;Neuromuscular re-education;Manual techniques;Patient/family education;Dry needling    PT Next Visit Plan  exercises as tolerated, try some gait trials.        Patient will benefit  from skilled therapeutic intervention in order to improve the following deficits and impairments:  Abnormal gait, Decreased range of motion, Difficulty walking, Increased muscle spasms, Pain, Impaired flexibility, Improper body mechanics, Decreased strength, Decreased mobility, Obesity  Visit Diagnosis: Lymphedema, not elsewhere classified  Acute bilateral low back pain without sciatica  Cervicalgia  Chronic pain of right knee  Chronic pain of left knee  Difficulty in walking, not elsewhere classified     Problem List Patient Active Problem List   Diagnosis Date Noted  . Chronic diastolic heart failure (HCC) 05/15/2018  . Dyspnea 05/15/2018  . Pulmonary nodules 05/11/2018  . Hypertensive urgency 05/11/2018  . Chronic respiratory failure with hypoxia (HCC) 05/10/2018  . 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  . DDD (degenerative disc disease), lumbar 03/21/2018  . Primary osteoarthritis of both knees 03/21/2018  . Dysphagia 03/19/2018  . PVC (premature ventricular contraction) 03/12/2018  . LVH (left ventricular hypertrophy) 03/12/2018  . Generalized edema 03/12/2018  . Bilateral carotid artery stenosis 03/12/2018  . Weight gain 01/25/2018  . Hemorrhoids 01/05/2018  . Generalized postprandial abdominal pain 01/05/2018  . Hiatal hernia 01/05/2018  . Esophageal dysmotilities 01/05/2018  . Mixed hyperlipidemia 09/12/2017  . Multinodular goiter 07/27/2017  . Hyperparathyroidism (HCC) 06/13/2017  . Vitamin D deficiency 06/13/2017  . Hx of adenomatous colonic polyps 05/15/2017  . Eustachian tube dysfunction, right 05/15/2017  . Lymphatic edema 05/15/2017  . Hypertension 05/14/2017  . Anemia 05/11/2017  . Arrhythmia 05/11/2017  . Diverticulosis 05/11/2017  . GERD (gastroesophageal reflux disease) 05/11/2017  . Hypothyroidism 05/11/2017  . Lower extremity edema 05/11/2017  . Morbid obesity (HCC) 05/11/2017  . Polycystic ovary syndrome 05/11/2017   . OSA on CPAP 05/11/2017  . Neck pain 05/11/2017  . Allergic rhinitis 05/11/2017  . 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/2012     G , PTA 05/30/2018, 4:10 PM  [facility] [address] [place], [place], [address] Phone: [phone]   Fax:  [phone]  Name: Madison Palmer MRN: [id number] Date of Birth: 03/15/1966   

## 2018-06-02 NOTE — Progress Notes (Signed)
Office: [phone]  /  Fax: [phone]   HPI:   Chief Complaint: OBESITY Madison Palmer is here to discuss her progress with her obesity treatment plan. She is on the Category 3 plan and is following her eating plan approximately 70 % of the time. She states she is exercising 0 minutes 0 times per week. Madison Palmer has been following the meal plan most of the time. She denies obstacles for thew next few weeks, except for Super Bowl Sunday, which she will do wings and veggie trays.  Her weight is (!) 330 lb (149.7 kg) today and has had a weight loss of 18 pounds over a period of 3 weeks since her last visit. She has lost 27 lbs since starting treatment with [place].  Hypertension Madison Palmer is a 53 y.o. female with hypertension. Madison Palmer's blood pressure is elevated again today, but previously controlled yesterday and only minimally elevated at other appointments. She recently got a arm blood pressure cuff for her arm instead of wrist. She has not taken hydralazine yet. She denies chest pain. She is working weight loss to help control her blood pressure with the goal of decreasing her risk of heart attack and stroke. Madison Palmer's blood pressure is not currently controlled.  Acute Hypoxemic Respiratory Failure Madison Palmer is on 2L [place] of O2. She is seeing Pulmonology, and she was just started on prednisone by Dr. Elsworth Soho.  Vitamin D Deficiency Madison Palmer has a diagnosis of vitamin D deficiency. She is currently taking prescription Vit D. She notes fatigue and denies nausea, vomiting or muscle weakness.  At risk for osteopenia and osteoporosis Madison Palmer is at higher risk of osteopenia and osteoporosis due to vitamin D deficiency.   ASSESSMENT AND PLAN:  Essential hypertension  Acute hypoxemic respiratory failure (HCC)  Vitamin D deficiency - Plan: DISCONTINUED: Vitamin D, Ergocalciferol, (DRISDOL) 1.25 MG (50000 UT) CAPS capsule  At risk for osteoporosis  Class 3 severe obesity with serious comorbidity and body  mass index (BMI) of 60.0 to 69.9 in adult, unspecified obesity type ([place])  PLAN:  Hypertension We discussed sodium restriction, working on healthy weight loss, and a regular exercise program as the means to achieve improved blood pressure control. Madison Palmer agreed with this plan and agreed to follow up as directed. We will continue to monitor her blood pressure as well as her progress with the above lifestyle modifications. Madison Palmer agrees to increase hydralazine to 2 pills q AM and 2 pills q PM. She will watch for signs of hypotension as she continues her lifestyle modifications. Madison Palmer agrees to follow up with our clinic in 2 weeks.  Acute Hypoxemic Respiratory Failure Madison Palmer is to follow up with Pulmonology on February 26th. Madison Palmer agrees to follow up with our clinic in 2 weeks.  Vitamin D Deficiency Madison Palmer was informed that low vitamin D levels contributes to fatigue and are associated with obesity, breast, and colon cancer. Madison Palmer agrees to continue taking prescription Vit D @50 ,000 IU every week #4 and we will refill for 1 month. She will follow up for routine testing of vitamin D, at least 2-3 times per year. She was informed of the risk of over-replacement of vitamin D and agrees to not increase her dose unless she discusses this with [place] first. Madison Palmer agrees to follow up with our clinic in 2 weeks.  At risk for osteopenia and osteoporosis Madison Palmer was given extended (15 minutes) osteoporosis prevention counseling today. Madison Palmer is at risk for osteopenia and osteoporsis due to her vitamin D deficiency. She was  encouraged to take her vitamin D and follow her higher calcium diet and increase strengthening exercise to help strengthen her bones and decrease her risk of osteopenia and osteoporosis.  Obesity Madison Palmer is currently in the action stage of change. As such, her goal is to continue with weight loss efforts She has agreed to follow the Category 3 plan Madison Palmer has been instructed to work up to a  goal of 150 minutes of combined cardio and strengthening exercise per week for weight loss and overall health benefits. We discussed the following Behavioral Modification Strategies today: increasing lean protein intake, increasing vegetables, work on meal planning and easy cooking plans, and planning for success   Madison Palmer has agreed to follow up with our clinic in 2 weeks. She was informed of the importance of frequent follow up visits to maximize her success with intensive lifestyle modifications for her multiple health conditions.  ALLERGIES: Allergies  Allergen Reactions  . Fentanyl Hives  . Levofloxacin Hives  . Midazolam Hives  . Pollen Extract     seasonal  . Atorvastatin     Muscle pain in legs   . Rosuvastatin     Abdominal pain    MEDICATIONS: Current Outpatient Medications on File Prior to Visit  Medication Sig Dispense Refill  . acetaminophen (TYLENOL) 500 MG tablet Take 1,000 mg by mouth 3 (three) times daily as needed for moderate pain or headache.    Marland Kitchen atenolol (TENORMIN) 25 MG tablet Take 25 mg by mouth daily.   0  . benzonatate (TESSALON) 100 MG capsule Take 1 capsule (100 mg total) by mouth 3 (three) times daily as needed for cough. 20 capsule 0  . cetirizine (ZYRTEC ALLERGY) 10 MG tablet Take 1 tablet (10 mg total) by mouth daily. (Patient taking differently: Take 10 mg by mouth as needed for allergies. Use as needed for allergies) 30 tablet 5  . esomeprazole (NEXIUM) 40 MG capsule TAKE ONE CAPSULE BY MOUTH TWICE DAILY 180 capsule 0  . fluticasone (FLONASE) 50 MCG/ACT nasal spray Place 2 sprays into both nostrils daily.    . furosemide (LASIX) 40 MG tablet Take 1 tablet (40 mg total) by mouth daily. 90 tablet 0  . hydrALAZINE (APRESOLINE) 50 MG tablet Take 1tab in morning and afternoon, and 2tabs at bedtime (Patient taking differently: Take 2tab in morning ,and 2tabs at bedtime) 120 tablet 6  . levothyroxine (SYNTHROID, LEVOTHROID) 75 MCG tablet TK 1 T PO QD   MONDAY-FRIDAY  0  . lisinopril (PRINIVIL,ZESTRIL) 10 MG tablet Take 1 tablet (10 mg total) by mouth daily. 30 tablet 0  . potassium chloride SA (K-DUR,KLOR-CON) 20 MEQ tablet Take 1 tablet (20 mEq total) by mouth daily. 90 tablet 0  . predniSONE (DELTASONE) 5 MG tablet Take 4 tabs for 7 days, then drop to 3 tabs for 7 days, then drop and remain at 2 tabs until OV 90 tablet 0  . metFORMIN (GLUCOPHAGE) 500 MG tablet Take 1 tablet (500 mg total) by mouth daily with breakfast. 30 tablet 0   No current facility-administered medications on file prior to visit.     PAST MEDICAL HISTORY: Past Medical History:  Diagnosis Date  . Anemia   . Anxiety   . Arrhythmia    tachycardia  . Arthritis   . Chickenpox   . Depression   . Diverticulitis   . GERD (gastroesophageal reflux disease)   . Glaucoma   . Hyperlipidemia   . Hyperparathyroidism ([place])   . Hypertension   . Inflammatory  polyps of colon ([place])   . LVH (left ventricular hypertrophy)   . Lymphedema   . PCOS (polycystic ovarian syndrome)   . Prediabetes   . Recurrent UTI   . Sleep apnea    CPAP  . Thyroid disease   . Vitamin D deficiency     PAST SURGICAL HISTORY: Past Surgical History:  Procedure Laterality Date  . BREAST BIOPSY  2015  . CESAREAN SECTION  2004  . INNER EAR SURGERY     ear and sinus surgery  . LAPAROSCOPIC REPAIR AND REMOVAL OF GASTRIC BAND    . OOPHORECTOMY Left   . TONSILLECTOMY AND ADENOIDECTOMY      SOCIAL HISTORY: Social History   Tobacco Use  . Smoking status: Never Smoker  . Smokeless tobacco: Never Used  Substance Use Topics  . Alcohol use: Yes    Comment: social  . Drug use: No    FAMILY HISTORY: Family History  Adopted: Yes  Problem Relation Age of Onset  . Hearing loss Son        right   . Healthy Son     ROS: Review of Systems  Constitutional: Positive for malaise/fatigue and weight loss.  Cardiovascular: Negative for chest pain.  Gastrointestinal: Negative for nausea and  vomiting.  Musculoskeletal:       Negative muscle weakness    PHYSICAL EXAM: Blood pressure (!) 183/97, pulse 75, temperature 98.2 F (36.8 C), temperature source Oral, height 5\' 2"  (1.575 m), weight (!) 330 lb (149.7 kg), last menstrual period 05/02/2017, SpO2 94 %. Body mass index is 60.36 kg/m. Physical Exam Vitals signs reviewed.  Constitutional:      Appearance: Normal appearance.  Cardiovascular:     Rate and Rhythm: Normal rate.     Pulses: Normal pulses.  Pulmonary:     Effort: Pulmonary effort is normal.     Breath sounds: Normal breath sounds.  Musculoskeletal: Normal range of motion.  Skin:    General: Skin is warm and dry.  Neurological:     Mental Status: She is alert and oriented to person, place, and time.  Psychiatric:        Mood and Affect: Mood normal.        Behavior: Behavior normal.     RECENT LABS AND TESTS: BMET    Component Value Date/Time   NA 143 05/21/2018 1421   NA 142 02/27/2018 1233   K 4.0 05/21/2018 1421   CL 99 05/21/2018 1421   CO2 39 (H) 05/21/2018 1421   GLUCOSE 98 05/21/2018 1421   BUN 27 (H) 05/21/2018 1421   BUN 15 02/27/2018 1233   CREATININE 1.11 05/21/2018 1421   CREATININE 0.94 12/21/2017 1640   CALCIUM 10.5 05/21/2018 1421   GFRNONAA >60 05/13/2018 0515   GFRAA >60 05/13/2018 0515   Lab Results  Component Value Date   HGBA1C 6.0 (H) 02/27/2018   HGBA1C 5.7 09/10/2017   HGBA1C 5.9 05/29/2017   Lab Results  Component Value Date   INSULIN 9.6 02/27/2018   CBC    Component Value Date/Time   WBC 6.8 05/13/2018 0515   RBC 5.05 05/13/2018 0515   HGB 12.9 05/13/2018 0515   HCT 43.5 05/13/2018 0515   PLT 253 05/13/2018 0515   MCV 86.1 05/13/2018 0515   MCH 25.5 (L) 05/13/2018 0515   MCHC 29.7 (L) 05/13/2018 0515   RDW 16.8 (H) 05/13/2018 0515   LYMPHSABS 1.0 05/11/2018 0630   MONOABS 0.5 05/11/2018 0630   EOSABS 0.1 05/11/2018 0630  BASOSABS 0.0 05/11/2018 0630   Iron/TIBC/Ferritin/ %Sat    Component  Value Date/Time   IRON 44 02/20/2018 1536   IRON 81 05/29/2017   TIBC 347 05/29/2017   IRONPCTSAT 8.8 (L) 02/20/2018 1536   Lipid Panel     Component Value Date/Time   CHOL 196 02/27/2018 1233   TRIG 142 02/27/2018 1233   HDL 46 02/27/2018 1233   CHOLHDL 5 09/10/2017 1156   VLDL 26.6 09/10/2017 1156   LDLCALC 122 (H) 02/27/2018 1233   Hepatic Function Panel     Component Value Date/Time   PROT 6.1 (L) 05/11/2018 0630   PROT 6.9 02/27/2018 1233   ALBUMIN 3.1 (L) 05/11/2018 0630   ALBUMIN 4.3 02/27/2018 1233   AST 25 05/11/2018 0630   ALT 39 05/11/2018 0630   ALKPHOS 71 05/11/2018 0630   BILITOT 1.6 (H) 05/11/2018 0630   BILITOT 0.9 02/27/2018 1233   BILIDIR 0.3 (H) 05/11/2018 0630   IBILI 1.3 (H) 05/11/2018 0630      Component Value Date/Time   TSH 0.938 05/11/2018 0630   TSH 1.450 02/27/2018 1233   TSH 0.97 12/11/2017 1158   TSH 1.28 05/29/2017      OBESITY BEHAVIORAL INTERVENTION VISIT  Today's visit was # 6   Starting weight: 357 lbs Starting date: 02/27/18 Today's weight : 330 lbs Today's date: 05/30/2018 Total lbs lost to date: 75    ASK: We discussed the diagnosis of obesity with [name] today and Alleen agreed to give [place] permission to discuss obesity behavioral modification therapy today.  ASSESS: Madison Palmer has the diagnosis of obesity and her BMI today is 60.34 Madison Palmer is in the action stage of change   ADVISE: Madison Palmer was educated on the multiple health risks of obesity as well as the benefit of weight loss to improve her health. She was advised of the need for long term treatment and the importance of lifestyle modifications to improve her current health and to decrease her risk of future health problems.  AGREE: Multiple dietary modification options and treatment options were discussed and  Madison Palmer agreed to follow the recommendations documented in the above note.  ARRANGE: Madison Palmer was educated on the importance of frequent visits to treat  obesity as outlined per CMS and USPSTF guidelines and agreed to schedule her next follow up appointment today.  I, Trixie Dredge, am acting as transcriptionist for Ilene Qua, MD  I have reviewed the above documentation for accuracy and completeness, and I agree with the above. - Ilene Qua, MD

## 2018-06-03 ENCOUNTER — Encounter: Payer: Self-pay | Admitting: Physical Therapy

## 2018-06-03 ENCOUNTER — Ambulatory Visit: Payer: BLUE CROSS/BLUE SHIELD | Attending: Nurse Practitioner | Admitting: Physical Therapy

## 2018-06-03 ENCOUNTER — Encounter: Payer: Self-pay | Admitting: Nurse Practitioner

## 2018-06-03 DIAGNOSIS — R262 Difficulty in walking, not elsewhere classified: Secondary | ICD-10-CM | POA: Diagnosis present

## 2018-06-03 DIAGNOSIS — G8929 Other chronic pain: Secondary | ICD-10-CM

## 2018-06-03 DIAGNOSIS — I890 Lymphedema, not elsewhere classified: Secondary | ICD-10-CM | POA: Diagnosis present

## 2018-06-03 DIAGNOSIS — M25562 Pain in left knee: Secondary | ICD-10-CM | POA: Diagnosis present

## 2018-06-03 DIAGNOSIS — M542 Cervicalgia: Secondary | ICD-10-CM | POA: Diagnosis present

## 2018-06-03 DIAGNOSIS — M25561 Pain in right knee: Secondary | ICD-10-CM | POA: Insufficient documentation

## 2018-06-03 NOTE — Therapy (Signed)
[place], [place], [address] Phone: [phone]   Fax:  [phone]  Physical Therapy Treatment  Patient Details  Name: Madison Palmer MRN: [id number] Date of Birth: 11-11-1965 Referring Provider (PT): Mardelle Matte   Encounter Date: 06/03/2018  PT End of Session - 06/03/18 1513    Visit Number  6    Date for PT Re-Evaluation  07/05/18    PT Start Time  1430    PT Stop Time  1528    PT Time Calculation (min)  58 min    Activity Tolerance  Patient tolerated treatment well    Behavior During Therapy  [facility] for tasks assessed/performed       Past Medical History:  Diagnosis Date  . Anemia   . Anxiety   . Arrhythmia    tachycardia  . Arthritis   . Chickenpox   . Depression   . Diverticulitis   . GERD (gastroesophageal reflux disease)   . Glaucoma   . Hyperlipidemia   . Hyperparathyroidism ([place])   . Hypertension   . Inflammatory polyps of colon ([place])   . LVH (left ventricular hypertrophy)   . Lymphedema   . PCOS (polycystic ovarian syndrome)   . Prediabetes   . Recurrent UTI   . Sleep apnea    CPAP  . Thyroid disease   . Vitamin D deficiency     Past Surgical History:  Procedure Laterality Date  . BREAST BIOPSY  2015  . CESAREAN SECTION  2004  . INNER EAR SURGERY     ear and sinus surgery  . LAPAROSCOPIC REPAIR AND REMOVAL OF GASTRIC BAND    . OOPHORECTOMY Left   . TONSILLECTOMY AND ADENOIDECTOMY      There were no vitals filed for this visit.  Subjective Assessment - 06/03/18 1441    Subjective  Pt reports some sharp pain in her back Thursday into Friday. Pt stated that she had some knee pain Friday.    Currently in Pain?  Yes    Pain Score  6     Pain Location  Knee    Pain Orientation  Right;Left                       [facility] Adult PT Treatment/Exercise - 06/03/18 0001      Ambulation/Gait   Gait Comments  2 laps inside clinic one seated reat break in  between 110f each       Lumbar Exercises: Aerobic   UBE (Upper Arm Bike)  L1 2 min each     Nustep  L3 x5 min       Lumbar Exercises: Standing   Row  Theraband;20 reps;Both;Strengthening    Theraband Level (Row)  Level 3 (Green)    Shoulder Extension  Strengthening;20 reps;Theraband    Theraband Level (Shoulder Extension)  Level 3 (Green)    Other Standing Lumbar Exercises  Shoulder ER red 2x10    Other Standing Lumbar Exercises  Straight arm pull downs 15lb 2x10       Lumbar Exercises: Seated   Sit to Stand  5 reps;10 reps   5 reps OHP with yellow ball      Modalities   Modalities  Electrical Stimulation;Moist Heat      Moist Heat Therapy   Number Minutes Moist Heat  15 Minutes    Moist Heat Location  Cervical      Electrical Stimulation   Electrical Stimulation  Location  cervical     Electrical Stimulation Action  IFC    Electrical Stimulation Parameters  sitting    Electrical Stimulation Goals  Pain               PT Short Term Goals - 05/20/18 1429      PT SHORT TERM GOAL #1   Title  independent with initial HEP    Status  Achieved        PT Long Term Goals - 05/30/18 1545      PT LONG TERM GOAL #1   Title  understand proper body mechanics for ADL's    Status  Partially Met      PT LONG TERM GOAL #3   Title  decrease pain 50%    Status  Partially Met      PT LONG TERM GOAL #4   Title  report able to do her normal housework without increase pain    Status  Partially Met      PT LONG TERM GOAL #5   Title  patietn will increase knee AROM to 100 degrees flexion    Status  On-going            Plan - 06/03/18 1514    Clinical Impression Statement  Pt tolerated a progressed treatment session well. She was able to ambulate around clinic. All exercises performed in standing.  Tactile cues needed to retract shoulder with rows, ER, and extensions. Reports some pain and tightness between shoulder bladed.    Rehab Potential  Good    PT Frequency  2x  / week    PT Duration  8 weeks    PT Treatment/Interventions  ADLs/Self Care Home Management;Cryotherapy;Electrical Stimulation;Moist Heat;Functional mobility training;Therapeutic activities;Therapeutic exercise;Balance training;Neuromuscular re-education;Manual techniques;Patient/family education;Dry needling    PT Next Visit Plan  exercises as tolerated, try some gait trials.        Patient will benefit from skilled therapeutic intervention in order to improve the following deficits and impairments:  Abnormal gait, Decreased range of motion, Difficulty walking, Increased muscle spasms, Pain, Impaired flexibility, Improper body mechanics, Decreased strength, Decreased mobility, Obesity  Visit Diagnosis: Chronic pain of left knee  Difficulty in walking, not elsewhere classified  Chronic pain of right knee     Problem List Patient Active Problem List   Diagnosis Date Noted  . Chronic diastolic heart failure ([place]) 05/15/2018  . Dyspnea 05/15/2018  . Pulmonary nodules 05/11/2018  . Hypertensive urgency 05/11/2018  . Chronic respiratory failure with hypoxia ([place]) 05/10/2018  . 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 DDD (degenerative disc disease), lumbar 03/21/2018  . Primary osteoarthritis of both knees 03/21/2018  . Dysphagia 03/19/2018  . PVC (premature ventricular contraction) 03/12/2018  . LVH (left ventricular hypertrophy) 03/12/2018  . Generalized edema 03/12/2018  . Bilateral carotid artery stenosis 03/12/2018  . Weight gain 01/25/2018  . Hemorrhoids 01/05/2018  . Generalized postprandial abdominal pain 01/05/2018  . Hiatal hernia 01/05/2018  . Esophageal dysmotilities 01/05/2018  . Mixed hyperlipidemia 09/12/2017  . Multinodular goiter 07/27/2017  . Hyperparathyroidism ([place]) 06/13/2017  . Vitamin D deficiency 06/13/2017  . Hx of adenomatous colonic polyps 05/15/2017  . Eustachian tube dysfunction, right 05/15/2017  . Lymphatic edema  05/15/2017  . Hypertension 05/14/2017  . Anemia 05/11/2017  . Arrhythmia 05/11/2017  . Diverticulosis 05/11/2017  . GERD (gastroesophageal reflux disease) 05/11/2017  . Hypothyroidism 05/11/2017  . Lower extremity edema 05/11/2017  . Morbid obesity ([place]) 05/11/2017  . Polycystic ovary syndrome  05/11/2017  . OSA on CPAP 05/11/2017  . Neck pain 05/11/2017  . Allergic rhinitis 05/11/2017  . 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

## 2018-06-04 ENCOUNTER — Telehealth: Payer: Self-pay

## 2018-06-04 NOTE — Telephone Encounter (Signed)
-----   Message from Irving Copas., MD sent at 06/04/2018  9:04 AM EST ----- Regarding: Procedures Monia Timmers, This patient will be on hold for Hospital based procedures while getting worked up from a Pulmonary perspective. Let's go ahead and cancel the procedure requests. Follow up in clinic in 6-weeks and we can determine timing of procedures. Thanks. GM

## 2018-06-04 NOTE — Telephone Encounter (Signed)
Follow up appt made and procedures are on hold. Pt aware of the appt

## 2018-06-05 ENCOUNTER — Ambulatory Visit: Payer: BLUE CROSS/BLUE SHIELD | Admitting: Physical Therapy

## 2018-06-05 ENCOUNTER — Encounter: Payer: Self-pay | Admitting: Physical Therapy

## 2018-06-05 DIAGNOSIS — R262 Difficulty in walking, not elsewhere classified: Secondary | ICD-10-CM

## 2018-06-05 DIAGNOSIS — G8929 Other chronic pain: Secondary | ICD-10-CM

## 2018-06-05 DIAGNOSIS — M25562 Pain in left knee: Secondary | ICD-10-CM | POA: Diagnosis not present

## 2018-06-05 DIAGNOSIS — M542 Cervicalgia: Secondary | ICD-10-CM

## 2018-06-05 DIAGNOSIS — M25561 Pain in right knee: Secondary | ICD-10-CM

## 2018-06-05 NOTE — Therapy (Signed)
[place], [place], [address] Phone: [phone]   Fax:  [phone]  Physical Therapy Treatment  Patient Details  Name: Madison Palmer MRN: [id number] Date of Birth: October 26, 1965 Referring Provider (PT): Mardelle Matte   Encounter Date: 06/05/2018  PT End of Session - 06/05/18 1515    Visit Number  7    Date for PT Re-Evaluation  07/05/18    Authorization Type  3    PT Start Time  1430    PT Stop Time  1528    PT Time Calculation (min)  58 min    Activity Tolerance  Patient tolerated treatment well    Behavior During Therapy  [facility] for tasks assessed/performed       Past Medical History:  Diagnosis Date  . Anemia   . Anxiety   . Arrhythmia    tachycardia  . Arthritis   . Chickenpox   . Depression   . Diverticulitis   . GERD (gastroesophageal reflux disease)   . Glaucoma   . Hyperlipidemia   . Hyperparathyroidism ([place])   . Hypertension   . Inflammatory polyps of colon ([place])   . LVH (left ventricular hypertrophy)   . Lymphedema   . PCOS (polycystic ovarian syndrome)   . Prediabetes   . Recurrent UTI   . Sleep apnea    CPAP  . Thyroid disease   . Vitamin D deficiency     Past Surgical History:  Procedure Laterality Date  . BREAST BIOPSY  2015  . CESAREAN SECTION  2004  . INNER EAR SURGERY     ear and sinus surgery  . LAPAROSCOPIC REPAIR AND REMOVAL OF GASTRIC BAND    . OOPHORECTOMY Left   . TONSILLECTOMY AND ADENOIDECTOMY      There were no vitals filed for this visit.  Subjective Assessment - 06/05/18 1442    Subjective  Pt reports that her back got tight Monday night, She also stated that her knee started hurting today around 1    Currently in Pain?  Yes    Pain Score  7     Pain Location  Knee    Pain Orientation  Right;Left                       [facility] Adult PT Treatment/Exercise - 06/05/18 0001      Ambulation/Gait   Gait Comments  2 laps inside clinic   75f each       Lumbar Exercises: Aerobic   UBE (Upper Arm Bike)  L1 2 min each     Nustep  L4 x5 min       Lumbar Exercises: Standing   Row  Theraband;Both;Strengthening;15 reps   x2   Theraband Level (Row)  Level 3 (Green)      Lumbar Exercises: Seated   Sit to Stand  10 reps   x2, 2nd set Wt ball OHP   Other Seated Lumbar Exercises  Green Tband HS curls 2x15 each      Modalities   Modalities  Electrical Stimulation;Moist Heat      Moist Heat Therapy   Number Minutes Moist Heat  15 Minutes    Moist Heat Location  Cervical      Electrical Stimulation   Electrical Stimulation Location  cervical     Electrical Stimulation Action  IFC    Electrical Stimulation Parameters  sitting    Electrical Stimulation Goals  Pain  PT Short Term Goals - 05/20/18 1429      PT SHORT TERM GOAL #1   Title  independent with initial HEP    Status  Achieved        PT Long Term Goals - 05/30/18 1545      PT LONG TERM GOAL #1   Title  understand proper body mechanics for ADL's    Status  Partially Met      PT LONG TERM GOAL #3   Title  decrease pain 50%    Status  Partially Met      PT LONG TERM GOAL #4   Title  report able to do her normal housework without increase pain    Status  Partially Met      PT LONG TERM GOAL #5   Title  patietn will increase knee AROM to 100 degrees flexion    Status  On-going            Plan - 06/05/18 1516    Clinical Impression Statement  Pt with good carryover form last treatment. She was able to continue gait trials without seated rest breaks. SP02 did drop into the low eighties needing time to recover. Pt did turn up her oxygen and that seemed to help. She was able to tolerated increase reps with sit to stands.     Rehab Potential  Good    PT Frequency  2x / week    PT Duration  8 weeks    PT Treatment/Interventions  ADLs/Self Care Home Management;Cryotherapy;Electrical Stimulation;Moist Heat;Functional mobility  training;Therapeutic activities;Therapeutic exercise;Balance training;Neuromuscular re-education;Manual techniques;Patient/family education;Dry needling    PT Next Visit Plan  exercises as tolerated, try some gait trials.        Patient will benefit from skilled therapeutic intervention in order to improve the following deficits and impairments:  Abnormal gait, Decreased range of motion, Difficulty walking, Increased muscle spasms, Pain, Impaired flexibility, Improper body mechanics, Decreased strength, Decreased mobility, Obesity  Visit Diagnosis: Difficulty in walking, not elsewhere classified  Chronic pain of left knee  Chronic pain of right knee  Cervicalgia     Problem List Patient Active Problem List   Diagnosis Date Noted  . Chronic diastolic heart failure ([place]) 05/15/2018  . Dyspnea 05/15/2018  . Pulmonary nodules 05/11/2018  . Hypertensive urgency 05/11/2018  . Chronic respiratory failure with hypoxia ([place]) 05/10/2018  . 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 DDD (degenerative disc disease), lumbar 03/21/2018  . Primary osteoarthritis of both knees 03/21/2018  . Dysphagia 03/19/2018  . PVC (premature ventricular contraction) 03/12/2018  . LVH (left ventricular hypertrophy) 03/12/2018  . Generalized edema 03/12/2018  . Bilateral carotid artery stenosis 03/12/2018  . Weight gain 01/25/2018  . Hemorrhoids 01/05/2018  . Generalized postprandial abdominal pain 01/05/2018  . Hiatal hernia 01/05/2018  . Esophageal dysmotilities 01/05/2018  . Mixed hyperlipidemia 09/12/2017  . Multinodular goiter 07/27/2017  . Hyperparathyroidism ([place]) 06/13/2017  . Vitamin D deficiency 06/13/2017  . Hx of adenomatous colonic polyps 05/15/2017  . Eustachian tube dysfunction, right 05/15/2017  . Lymphatic edema 05/15/2017  . Hypertension 05/14/2017  . Anemia 05/11/2017  . Arrhythmia 05/11/2017  . Diverticulosis 05/11/2017  . GERD (gastroesophageal reflux  disease) 05/11/2017  . Hypothyroidism 05/11/2017  . Lower extremity edema 05/11/2017  . Morbid obesity ([place]) 05/11/2017  . Polycystic ovary syndrome 05/11/2017  . OSA on CPAP 05/11/2017  . Neck pain 05/11/2017  . Allergic rhinitis 05/11/2017  . 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

## 2018-06-07 ENCOUNTER — Encounter: Payer: Self-pay | Admitting: Nurse Practitioner

## 2018-06-07 DIAGNOSIS — I10 Essential (primary) hypertension: Secondary | ICD-10-CM

## 2018-06-07 MED ORDER — LISINOPRIL 10 MG PO TABS: 10.0000 mg | ORAL_TABLET | Freq: Every day | ORAL | 3 refills | Status: DC

## 2018-06-10 ENCOUNTER — Telehealth: Payer: Self-pay | Admitting: *Deleted

## 2018-06-10 MED ORDER — ATENOLOL 25 MG PO TABS: 25.0000 mg | ORAL_TABLET | Freq: Two times a day (BID) | ORAL | 3 refills | Status: DC

## 2018-06-10 NOTE — Telephone Encounter (Signed)
Call and spoke with pt about Dr Percival Spanish MyChart recommendation to Increase Atenolol 25 mg twice a day Rx has been sent to the pharmacy electronically. # [phone] refills

## 2018-06-11 ENCOUNTER — Ambulatory Visit: Payer: BLUE CROSS/BLUE SHIELD | Admitting: Physical Therapy

## 2018-06-11 ENCOUNTER — Encounter: Payer: Self-pay | Admitting: Physical Therapy

## 2018-06-11 DIAGNOSIS — R262 Difficulty in walking, not elsewhere classified: Secondary | ICD-10-CM

## 2018-06-11 DIAGNOSIS — M25562 Pain in left knee: Secondary | ICD-10-CM

## 2018-06-11 DIAGNOSIS — M542 Cervicalgia: Secondary | ICD-10-CM

## 2018-06-11 DIAGNOSIS — M25561 Pain in right knee: Secondary | ICD-10-CM

## 2018-06-11 DIAGNOSIS — G8929 Other chronic pain: Secondary | ICD-10-CM

## 2018-06-11 NOTE — Therapy (Signed)
[place], [place], [address] Phone: [phone]   Fax:  [phone]  Physical Therapy Treatment  Patient Details  Name: Madison Palmer MRN: [id number] Date of Birth: [birth year] Referring Provider (PT): Mardelle Matte   Encounter Date: 06/11/2018  PT End of Session - 06/11/18 1243    Visit Number  8    Date for PT Re-Evaluation  07/05/18    PT Start Time  1150    PT Stop Time  1247    PT Time Calculation (min)  57 min    Activity Tolerance  Patient tolerated treatment well    Behavior During Therapy  [facility] for tasks assessed/performed       Past Medical History:  Diagnosis Date  . Anemia   . Anxiety   . Arrhythmia    tachycardia  . Arthritis   . Chickenpox   . Depression   . Diverticulitis   . GERD (gastroesophageal reflux disease)   . Glaucoma   . Hyperlipidemia   . Hyperparathyroidism ([place])   . Hypertension   . Inflammatory polyps of colon ([place])   . LVH (left ventricular hypertrophy)   . Lymphedema   . PCOS (polycystic ovarian syndrome)   . Prediabetes   . Recurrent UTI   . Sleep apnea    CPAP  . Thyroid disease   . Vitamin D deficiency     Past Surgical History:  Procedure Laterality Date  . BREAST BIOPSY  2015  . CESAREAN SECTION  2004  . INNER EAR SURGERY     ear and sinus surgery  . LAPAROSCOPIC REPAIR AND REMOVAL OF GASTRIC BAND    . OOPHORECTOMY Left   . TONSILLECTOMY AND ADENOIDECTOMY      There were no vitals filed for this visit.  Subjective Assessment - 06/11/18 1158    Subjective  "They hurting today" Pt stated that she wore crocs yesterday, could be causing knee pain    Currently in Pain?  Yes    Pain Score  7     Pain Location  Knee    Pain Orientation  Right;Left         [facility] PT Assessment - 06/11/18 0001      Posture/Postural Control   Posture Comments  fwd head, obese, rounded shoulders      AROM   Overall AROM Comments  knee AROM is 0-106  degrees flexion with mild crepitus palpable,R knee pain with flexion, Lumbar ROM WNL's But a delayed pain when sitting and R side bending                   [facility] Adult PT Treatment/Exercise - 06/11/18 0001      Ambulation/Gait   Gait Comments  2 laps inside clinic  18f each       Lumbar Exercises: Aerobic   UBE (Upper Arm Bike)  L1 2 min each     Nustep  L3 x5 min       Lumbar Exercises: Seated   Long Arc Quad on Chair  Both;Weights;15 reps;1 set    LAQ on Chair Weights (lbs)  3    Sit to Stand  10 reps   OHP yellow ball    Other Seated Lumbar Exercises  Green Tband HS curls x20 each      Modalities   Modalities  Electrical Stimulation;Moist Heat      Moist Heat Therapy   Number Minutes Moist Heat  15 Minutes  Moist Heat Location  Cervical      Electrical Stimulation   Electrical Stimulation Location  cervical     Electrical Stimulation Action  IFC    Electrical Stimulation Parameters  sitting    Electrical Stimulation Goals  Pain               PT Short Term Goals - 05/20/18 1429      PT SHORT TERM GOAL #1   Title  independent with initial HEP    Status  Achieved        PT Long Term Goals - 06/11/18 1212      PT LONG TERM GOAL #2   Title  increase lumbar ROM 25%    Status  Achieved      PT LONG TERM GOAL #4   Title  report able to do her normal housework without increase pain    Status  Partially Met      PT LONG TERM GOAL #5   Title  patietn will increase knee AROM to 100 degrees flexion    Status  Achieved            Plan - 06/11/18 1239    Clinical Impression Statement  Pt has progressed meeting both knee and lumbar ROM goals. Pt also able to increase her single trial gait distance despite initial reports of R knee pain. R shoulder pain reported with OHP. Some R knee discomfort with LAQ on RLE    Rehab Potential  Good    PT Frequency  2x / week    PT Duration  8 weeks    PT Treatment/Interventions  ADLs/Self Care Home  Management;Cryotherapy;Electrical Stimulation;Moist Heat;Functional mobility training;Therapeutic activities;Therapeutic exercise;Balance training;Neuromuscular re-education;Manual techniques;Patient/family education;Dry needling    PT Next Visit Plan  exercises as tolerated, try some gait trials.        Patient will benefit from skilled therapeutic intervention in order to improve the following deficits and impairments:  Abnormal gait, Decreased range of motion, Difficulty walking, Increased muscle spasms, Pain, Impaired flexibility, Improper body mechanics, Decreased strength, Decreased mobility, Obesity  Visit Diagnosis: Difficulty in walking, not elsewhere classified  Chronic pain of left knee  Chronic pain of right knee  Cervicalgia     Problem List Patient Active Problem List   Diagnosis Date Noted  . Chronic diastolic heart failure ([place]) 05/15/2018  . Dyspnea 05/15/2018  . Pulmonary nodules 05/11/2018  . Hypertensive urgency 05/11/2018  . Chronic respiratory failure with hypoxia ([place]) 05/10/2018  . 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 DDD (degenerative disc disease), lumbar 03/21/2018  . Primary osteoarthritis of both knees 03/21/2018  . Dysphagia 03/19/2018  . PVC (premature ventricular contraction) 03/12/2018  . LVH (left ventricular hypertrophy) 03/12/2018  . Generalized edema 03/12/2018  . Bilateral carotid artery stenosis 03/12/2018  . Weight gain 01/25/2018  . Hemorrhoids 01/05/2018  . Generalized postprandial abdominal pain 01/05/2018  . Hiatal hernia 01/05/2018  . Esophageal dysmotilities 01/05/2018  . Mixed hyperlipidemia 09/12/2017  . Multinodular goiter 07/27/2017  . Hyperparathyroidism ([place]) 06/13/2017  . Vitamin D deficiency 06/13/2017  . Hx of adenomatous colonic polyps 05/15/2017  . Eustachian tube dysfunction, right 05/15/2017  . Lymphatic edema 05/15/2017  . Hypertension 05/14/2017  . Anemia 05/11/2017  . Arrhythmia  05/11/2017  . Diverticulosis 05/11/2017  . GERD (gastroesophageal reflux disease) 05/11/2017  . Hypothyroidism 05/11/2017  . Lower extremity edema 05/11/2017  . Morbid obesity ([place]) 05/11/2017  . Polycystic ovary syndrome 05/11/2017  . OSA on CPAP 05/11/2017  .  Neck pain 05/11/2017  . Allergic rhinitis 05/11/2017  . 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

## 2018-06-13 ENCOUNTER — Encounter: Payer: Self-pay | Admitting: Nurse Practitioner

## 2018-06-13 ENCOUNTER — Ambulatory Visit: Payer: BLUE CROSS/BLUE SHIELD | Admitting: Physical Therapy

## 2018-06-13 ENCOUNTER — Ambulatory Visit (INDEPENDENT_AMBULATORY_CARE_PROVIDER_SITE_OTHER): Payer: BLUE CROSS/BLUE SHIELD | Admitting: Family Medicine

## 2018-06-13 ENCOUNTER — Encounter (INDEPENDENT_AMBULATORY_CARE_PROVIDER_SITE_OTHER): Payer: Self-pay | Admitting: Family Medicine

## 2018-06-13 ENCOUNTER — Encounter: Payer: Self-pay | Admitting: Physical Therapy

## 2018-06-13 VITALS — BP 193/93 | HR 71 | Temp 98.3°F | Ht 62.0 in | Wt 341.0 lb

## 2018-06-13 DIAGNOSIS — Z9189 Other specified personal risk factors, not elsewhere classified: Secondary | ICD-10-CM | POA: Diagnosis not present

## 2018-06-13 DIAGNOSIS — I10 Essential (primary) hypertension: Secondary | ICD-10-CM | POA: Diagnosis not present

## 2018-06-13 DIAGNOSIS — M25562 Pain in left knee: Secondary | ICD-10-CM

## 2018-06-13 DIAGNOSIS — M542 Cervicalgia: Secondary | ICD-10-CM

## 2018-06-13 DIAGNOSIS — Z6841 Body Mass Index (BMI) 40.0 and over, adult: Secondary | ICD-10-CM

## 2018-06-13 DIAGNOSIS — R262 Difficulty in walking, not elsewhere classified: Secondary | ICD-10-CM

## 2018-06-13 DIAGNOSIS — G8929 Other chronic pain: Secondary | ICD-10-CM

## 2018-06-13 DIAGNOSIS — I5031 Acute diastolic (congestive) heart failure: Secondary | ICD-10-CM

## 2018-06-13 DIAGNOSIS — M25561 Pain in right knee: Secondary | ICD-10-CM

## 2018-06-13 NOTE — Therapy (Signed)
[place], [place], [address] Phone: [phone]   Fax:  [phone]  Physical Therapy Treatment  Patient Details  Name: Madison Palmer MRN: [id number] Date of Birth: March 21, 1966 Referring Provider (PT): Mardelle Matte   Encounter Date: 06/13/2018  PT End of Session - 06/13/18 1234    Visit Number  9    Date for PT Re-Evaluation  07/05/18    PT Start Time  1150    PT Stop Time  1246    PT Time Calculation (min)  56 min    Activity Tolerance  Patient tolerated treatment well    Behavior During Therapy  [facility] for tasks assessed/performed       Past Medical History:  Diagnosis Date  . Anemia   . Anxiety   . Arrhythmia    tachycardia  . Arthritis   . Chickenpox   . Depression   . Diverticulitis   . GERD (gastroesophageal reflux disease)   . Glaucoma   . Hyperlipidemia   . Hyperparathyroidism ([place])   . Hypertension   . Inflammatory polyps of colon ([place])   . LVH (left ventricular hypertrophy)   . Lymphedema   . PCOS (polycystic ovarian syndrome)   . Prediabetes   . Recurrent UTI   . Sleep apnea    CPAP  . Thyroid disease   . Vitamin D deficiency     Past Surgical History:  Procedure Laterality Date  . BREAST BIOPSY  2015  . CESAREAN SECTION  2004  . INNER EAR SURGERY     ear and sinus surgery  . LAPAROSCOPIC REPAIR AND REMOVAL OF GASTRIC BAND    . OOPHORECTOMY Left   . TONSILLECTOMY AND ADENOIDECTOMY      There were no vitals filed for this visit.  Subjective Assessment - 06/13/18 1201    Subjective  "Knees are pain about a 6" Knees was decent after last PT treatment then started having some pain the next day. Back tightness started yesterday    Currently in Pain?  Yes    Pain Score  6    4/10 in shoulders   Pain Location  Knee    Pain Orientation  Right;Left                       [facility] Adult PT Treatment/Exercise - 06/13/18 0001      High Level Balance   High Level Balance Activities  Side stepping      Lumbar Exercises: Aerobic   UBE (Upper Arm Bike)  L2 2 min each     Nustep  L4 x6 min       Lumbar Exercises: Standing   Row  Theraband;Both;Strengthening;15 reps   x2   Theraband Level (Row)  Level 3 (Green)    Other Standing Lumbar Exercises  triceps ext 20lb x20      Lumbar Exercises: Seated   Long Arc Quad on Chair  Both;Weights;15 reps;1 set    LAQ on Chair Weights (lbs)  3    Sit to Stand  10 reps;20 reps   Lowered UBE holding yellow ball    Other Seated Lumbar Exercises  Green Tband HS curls x20 each               PT Short Term Goals - 05/20/18 1429      PT SHORT TERM GOAL #1   Title  independent with initial HEP    Status  Achieved  PT Long Term Goals - 06/11/18 1212      PT LONG TERM GOAL #2   Title  increase lumbar ROM 25%    Status  Achieved      PT LONG TERM GOAL #4   Title  report able to do her normal housework without increase pain    Status  Partially Met      PT LONG TERM GOAL #5   Title  patietn will increase knee AROM to 100 degrees flexion    Status  Achieved            Plan - 06/13/18 1235    Clinical Impression Statement  WC assist needed in and out of clinic. Despite reporting knee pain pt able to complete all of today exercises. Cues needed to keep hips straight with side steps. She is doing well with progressed session. tactile cues to get pt to squeeze shoulder blades together with rows. Pt reports improved mobility at home.    Rehab Potential  Good    PT Frequency  2x / week    PT Duration  8 weeks    PT Treatment/Interventions  ADLs/Self Care Home Management;Cryotherapy;Electrical Stimulation;Moist Heat;Functional mobility training;Therapeutic activities;Therapeutic exercise;Balance training;Neuromuscular re-education;Manual techniques;Patient/family education;Dry needling    PT Next Visit Plan  exercises as tolerated, try some gait trials.        Patient will  benefit from skilled therapeutic intervention in order to improve the following deficits and impairments:  Abnormal gait, Decreased range of motion, Difficulty walking, Increased muscle spasms, Pain, Impaired flexibility, Improper body mechanics, Decreased strength, Decreased mobility, Obesity  Visit Diagnosis: Difficulty in walking, not elsewhere classified  Chronic pain of left knee  Chronic pain of right knee  Cervicalgia     Problem List Patient Active Problem List   Diagnosis Date Noted  . Chronic diastolic heart failure ([place]) 05/15/2018  . Dyspnea 05/15/2018  . Pulmonary nodules 05/11/2018  . Hypertensive urgency 05/11/2018  . Chronic respiratory failure with hypoxia ([place]) 05/10/2018  . 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 DDD (degenerative disc disease), lumbar 03/21/2018  . Primary osteoarthritis of both knees 03/21/2018  . Dysphagia 03/19/2018  . PVC (premature ventricular contraction) 03/12/2018  . LVH (left ventricular hypertrophy) 03/12/2018  . Generalized edema 03/12/2018  . Bilateral carotid artery stenosis 03/12/2018  . Weight gain 01/25/2018  . Hemorrhoids 01/05/2018  . Generalized postprandial abdominal pain 01/05/2018  . Hiatal hernia 01/05/2018  . Esophageal dysmotilities 01/05/2018  . Mixed hyperlipidemia 09/12/2017  . Multinodular goiter 07/27/2017  . Hyperparathyroidism ([place]) 06/13/2017  . Vitamin D deficiency 06/13/2017  . Hx of adenomatous colonic polyps 05/15/2017  . Eustachian tube dysfunction, right 05/15/2017  . Lymphatic edema 05/15/2017  . Hypertension 05/14/2017  . Anemia 05/11/2017  . Arrhythmia 05/11/2017  . Diverticulosis 05/11/2017  . GERD (gastroesophageal reflux disease) 05/11/2017  . Hypothyroidism 05/11/2017  . Lower extremity edema 05/11/2017  . Morbid obesity ([place]) 05/11/2017  . Polycystic ovary syndrome 05/11/2017  . OSA on CPAP 05/11/2017  . Neck pain 05/11/2017  . Allergic rhinitis 05/11/2017   . 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

## 2018-06-14 ENCOUNTER — Ambulatory Visit: Payer: BLUE CROSS/BLUE SHIELD | Admitting: Nurse Practitioner

## 2018-06-15 NOTE — Progress Notes (Signed)
Office: [phone]  /  Fax: [phone]   HPI:   Chief Complaint: OBESITY Madison Palmer is here to discuss her progress with her obesity treatment plan. She is on the Category 3 plan and is following her eating plan approximately 60 % of the time. She states she is doing physical therapy for 45 minutes 2 times per week. [place]ica reports some increase in salt intake in form of chips and salsa, Cheetos, etc. She finds herself snacking more and getting in around 60 % of the food. She notes occasional hunger, getting in all meat at dinner and all the rest of the meals. Her weight is (!) 341 lb (154.7 kg) today and has gained 11 pounds since her last visit. She has lost 16 lbs since starting treatment with [place].  Hypertension Madison Palmer is a 53 y.o. female with hypertension. Madison Palmer's blood pressure is significantly elevated today. Dr. Percival Spanish recently increased atenolol. She denies chest pain, chest pressure, or headaches. She is working weight loss to help control her blood pressure with the goal of decreasing her risk of heart attack and stroke. Madison Palmer's blood pressure is currently controlled.  Acute Congestive Heart Failure with Left Ventricular Diastolic Dysfunction Madison Palmer sees Dr. Percival Spanish. She is up in weight 11 lbs but she reports some increase in salt intake. She denies orthopnea or dyspnea.  At risk for cardiovascular disease Madison Palmer is at a higher than average risk for cardiovascular disease due to obesity, hypertension, and CHF. She currently denies any chest pain.  ASSESSMENT AND PLAN:  Essential hypertension  Acute diastolic CHF (congestive heart failure) (HCC)  At risk for heart disease  Class 3 severe obesity with serious comorbidity and body mass index (BMI) of 60.0 to 69.9 in adult, unspecified obesity type ([place])  PLAN:  Hypertension We discussed sodium restriction, working on healthy weight loss, and a regular exercise program as the means to achieve improved blood  pressure control. Hanalei agreed with this plan and agreed to follow up as directed. We will continue to monitor her blood pressure as well as her progress with the above lifestyle modifications. Delailah agrees to continue her medications and will watch for signs of hypotension as she continues her lifestyle modifications. She is to follow up with her primary care physician, Dr. Percival Spanish and check blood pressure at home. [name] agrees to follow up with our clinic in 2 weeks.  Acute Congestive Heart Failure with Left Ventricular Diastolic Dysfunction Madison Palmer agrees to increase Lasix to BID. She is to MyChart her primary care physician and discusss increase in Lasix and weight gain. Madison Palmer was encouraged to go to the emergency department if she has any signs and symptoms develop, including edema, orthopnea, dyspnea, or chest pain. Madison Palmer agrees to follow up with our clinic in 2 weeks.  Cardiovascular risk counseling Madison Palmer was given extended (15 minutes) coronary artery disease prevention counseling today. She is 53 y.o. female and has risk factors for heart disease including obesity, hypertension, and CHF. We discussed intensive lifestyle modifications today with an emphasis on specific weight loss instructions and strategies. Pt was also informed of the importance of increasing exercise and decreasing saturated fats to help prevent heart disease.  Obesity Madison Palmer is currently in the action stage of change. As such, her goal is to continue with weight loss efforts She has agreed to follow the Category 3 plan Madison Palmer has been instructed to work up to a goal of 150 minutes of combined cardio and strengthening exercise per week for weight loss  and overall health benefits. We discussed the following Behavioral Modification Strategies today: increasing lean protein intake, increasing vegetables and work on meal planning and easy cooking plans, and planning for success   Madison Palmer has agreed to follow up with our  clinic in 2 weeks. She was informed of the importance of frequent follow up visits to maximize her success with intensive lifestyle modifications for her multiple health conditions.  ALLERGIES: Allergies  Allergen Reactions  . Fentanyl Hives  . Levofloxacin Hives  . Midazolam Hives  . Pollen Extract     seasonal  . Atorvastatin     Muscle pain in legs   . Rosuvastatin     Abdominal pain    MEDICATIONS: Current Outpatient Medications on File Prior to Visit  Medication Sig Dispense Refill  . acetaminophen (TYLENOL) 500 MG tablet Take 1,000 mg by mouth 3 (three) times daily as needed for moderate pain or headache.    Marland Kitchen atenolol (TENORMIN) 25 MG tablet Take 1 tablet (25 mg total) by mouth 2 (two) times daily. 180 tablet 3  . benzonatate (TESSALON) 100 MG capsule Take 1 capsule (100 mg total) by mouth 3 (three) times daily as needed for cough. 20 capsule 0  . cetirizine (ZYRTEC ALLERGY) 10 MG tablet Take 1 tablet (10 mg total) by mouth daily. (Patient taking differently: Take 10 mg by mouth as needed for allergies. Use as needed for allergies) 30 tablet 5  . esomeprazole (NEXIUM) 40 MG capsule TAKE ONE CAPSULE BY MOUTH TWICE DAILY 180 capsule 0  . fluticasone (FLONASE) 50 MCG/ACT nasal spray Place 2 sprays into both nostrils daily.    . furosemide (LASIX) 40 MG tablet Take 1 tablet (40 mg total) by mouth daily. 90 tablet 0  . hydrALAZINE (APRESOLINE) 50 MG tablet Take 1tab in morning and afternoon, and 2tabs at bedtime (Patient taking differently: Take 2tab in morning ,and 2tabs at bedtime) 120 tablet 6  . levothyroxine (SYNTHROID, LEVOTHROID) 75 MCG tablet TK 1 T PO QD  MONDAY-FRIDAY  0  . lisinopril (PRINIVIL,ZESTRIL) 10 MG tablet Take 1 tablet (10 mg total) by mouth daily. 90 tablet 3  . metFORMIN (GLUCOPHAGE) 500 MG tablet Take 1 tablet (500 mg total) by mouth daily with breakfast. 30 tablet 0  . potassium chloride SA (K-DUR,KLOR-CON) 20 MEQ tablet Take 1 tablet (20 mEq total) by mouth  daily. 90 tablet 0  . predniSONE (DELTASONE) 5 MG tablet Take 4 tabs for 7 days, then drop to 3 tabs for 7 days, then drop and remain at 2 tabs until OV 90 tablet 0  . Vitamin D, Ergocalciferol, (DRISDOL) 1.25 MG (50000 UT) CAPS capsule TAKE 1 CAPSULE BY MOUTH EVERY 7 DAYS 12 capsule 0   No current facility-administered medications on file prior to visit.     PAST MEDICAL HISTORY: Past Medical History:  Diagnosis Date  . Anemia   . Anxiety   . Arrhythmia    tachycardia  . Arthritis   . Chickenpox   . Depression   . Diverticulitis   . GERD (gastroesophageal reflux disease)   . Glaucoma   . Hyperlipidemia   . Hyperparathyroidism ([place])   . Hypertension   . Inflammatory polyps of colon ([place])   . LVH (left ventricular hypertrophy)   . Lymphedema   . PCOS (polycystic ovarian syndrome)   . Prediabetes   . Recurrent UTI   . Sleep apnea    CPAP  . Thyroid disease   . Vitamin D deficiency  PAST SURGICAL HISTORY: Past Surgical History:  Procedure Laterality Date  . BREAST BIOPSY  2015  . CESAREAN SECTION  2004  . INNER EAR SURGERY     ear and sinus surgery  . LAPAROSCOPIC REPAIR AND REMOVAL OF GASTRIC BAND    . OOPHORECTOMY Left   . TONSILLECTOMY AND ADENOIDECTOMY      SOCIAL HISTORY: Social History   Tobacco Use  . Smoking status: Never Smoker  . Smokeless tobacco: Never Used  Substance Use Topics  . Alcohol use: Yes    Comment: social  . Drug use: No    FAMILY HISTORY: Family History  Adopted: Yes  Problem Relation Age of Onset  . Hearing loss Son        right   . Healthy Son     ROS: Review of Systems  Constitutional: Negative for weight loss.  Respiratory:       Negative dyspnea  Cardiovascular: Negative for chest pain and orthopnea.       Negative chest pressure  Neurological: Negative for headaches.    PHYSICAL EXAM: Blood pressure (!) 193/93, pulse 71, temperature 98.3 F (36.8 C), temperature source Oral, height 5\' 2"  (1.575 m), weight  (!) 341 lb (154.7 kg), last menstrual period 05/02/2017, SpO2 90 %. Body mass index is 62.37 kg/m. Physical Exam Vitals signs reviewed.  Constitutional:      Appearance: Normal appearance. She is obese.  Cardiovascular:     Rate and Rhythm: Normal rate.     Pulses: Normal pulses.  Pulmonary:     Effort: Pulmonary effort is normal.     Breath sounds: Normal breath sounds.  Musculoskeletal: Normal range of motion.  Skin:    General: Skin is warm and dry.  Neurological:     Mental Status: She is alert and oriented to person, place, and time.  Psychiatric:        Mood and Affect: Mood normal.        Behavior: Behavior normal.     RECENT LABS AND TESTS: BMET    Component Value Date/Time   NA 143 05/21/2018 1421   NA 142 02/27/2018 1233   K 4.0 05/21/2018 1421   CL 99 05/21/2018 1421   CO2 39 (H) 05/21/2018 1421   GLUCOSE 98 05/21/2018 1421   BUN 27 (H) 05/21/2018 1421   BUN 15 02/27/2018 1233   CREATININE 1.11 05/21/2018 1421   CREATININE 0.94 12/21/2017 1640   CALCIUM 10.5 05/21/2018 1421   GFRNONAA >60 05/13/2018 0515   GFRAA >60 05/13/2018 0515   Lab Results  Component Value Date   HGBA1C 6.0 (H) 02/27/2018   HGBA1C 5.7 09/10/2017   HGBA1C 5.9 05/29/2017   Lab Results  Component Value Date   INSULIN 9.6 02/27/2018   CBC    Component Value Date/Time   WBC 6.8 05/13/2018 0515   RBC 5.05 05/13/2018 0515   HGB 12.9 05/13/2018 0515   HCT 43.5 05/13/2018 0515   PLT 253 05/13/2018 0515   MCV 86.1 05/13/2018 0515   MCH 25.5 (L) 05/13/2018 0515   MCHC 29.7 (L) 05/13/2018 0515   RDW 16.8 (H) 05/13/2018 0515   LYMPHSABS 1.0 05/11/2018 0630   MONOABS 0.5 05/11/2018 0630   EOSABS 0.1 05/11/2018 0630   BASOSABS 0.0 05/11/2018 0630   Iron/TIBC/Ferritin/ %Sat    Component Value Date/Time   IRON 44 02/20/2018 1536   IRON 81 05/29/2017   TIBC 347 05/29/2017   IRONPCTSAT 8.8 (L) 02/20/2018 1536   Lipid Panel  Component Value Date/Time   CHOL 196  02/27/2018 1233   TRIG 142 02/27/2018 1233   HDL 46 02/27/2018 1233   CHOLHDL 5 09/10/2017 1156   VLDL 26.6 09/10/2017 1156   LDLCALC 122 (H) 02/27/2018 1233   Hepatic Function Panel     Component Value Date/Time   PROT 6.1 (L) 05/11/2018 0630   PROT 6.9 02/27/2018 1233   ALBUMIN 3.1 (L) 05/11/2018 0630   ALBUMIN 4.3 02/27/2018 1233   AST 25 05/11/2018 0630   ALT 39 05/11/2018 0630   ALKPHOS 71 05/11/2018 0630   BILITOT 1.6 (H) 05/11/2018 0630   BILITOT 0.9 02/27/2018 1233   BILIDIR 0.3 (H) 05/11/2018 0630   IBILI 1.3 (H) 05/11/2018 0630      Component Value Date/Time   TSH 0.938 05/11/2018 0630   TSH 1.450 02/27/2018 1233   TSH 0.97 12/11/2017 1158   TSH 1.28 05/29/2017      OBESITY BEHAVIORAL INTERVENTION VISIT  Today's visit was # 7   Starting weight: 357 lbs Starting date: 02/27/18 Today's weight : 341 lbs  Today's date: 06/13/2018 Total lbs lost to date: 16    ASK: We discussed the diagnosis of obesity with [name] today and Amiaya agreed to give [place] permission to discuss obesity behavioral modification therapy today.  ASSESS: Claramae has the diagnosis of obesity and her BMI today is 62.35 Teia is in the action stage of change   ADVISE: Aileene was educated on the multiple health risks of obesity as well as the benefit of weight loss to improve her health. She was advised of the need for long term treatment and the importance of lifestyle modifications to improve her current health and to decrease her risk of future health problems.  AGREE: Multiple dietary modification options and treatment options were discussed and  Miquel agreed to follow the recommendations documented in the above note.  ARRANGE: Donni was educated on the importance of frequent visits to treat obesity as outlined per CMS and USPSTF guidelines and agreed to schedule her next follow up appointment today.  I, Trixie Dredge, am acting as transcriptionist for Ilene Qua,  MD  I have reviewed the above documentation for accuracy and completeness, and I agree with the above. - Ilene Qua, MD

## 2018-06-18 ENCOUNTER — Other Ambulatory Visit (INDEPENDENT_AMBULATORY_CARE_PROVIDER_SITE_OTHER): Payer: BLUE CROSS/BLUE SHIELD

## 2018-06-18 ENCOUNTER — Encounter: Payer: Self-pay | Admitting: Physical Therapy

## 2018-06-18 ENCOUNTER — Ambulatory Visit: Payer: BLUE CROSS/BLUE SHIELD | Admitting: Physical Therapy

## 2018-06-18 DIAGNOSIS — M25561 Pain in right knee: Secondary | ICD-10-CM

## 2018-06-18 DIAGNOSIS — I5031 Acute diastolic (congestive) heart failure: Secondary | ICD-10-CM

## 2018-06-18 DIAGNOSIS — M25562 Pain in left knee: Secondary | ICD-10-CM

## 2018-06-18 DIAGNOSIS — R262 Difficulty in walking, not elsewhere classified: Secondary | ICD-10-CM

## 2018-06-18 DIAGNOSIS — M542 Cervicalgia: Secondary | ICD-10-CM

## 2018-06-18 DIAGNOSIS — G8929 Other chronic pain: Secondary | ICD-10-CM

## 2018-06-18 LAB — BASIC METABOLIC PANEL
BUN: 28 mg/dL — ABNORMAL HIGH (ref 6–23)
CHLORIDE: 97 meq/L (ref 96–112)
CO2: 37 mEq/L — ABNORMAL HIGH (ref 19–32)
Calcium: 10.3 mg/dL (ref 8.4–10.5)
Creatinine, Ser: 0.91 mg/dL (ref 0.40–1.20)
GFR: 64.87 mL/min (ref >60.00)
Glucose, Bld: 125 mg/dL — ABNORMAL HIGH (ref 70–99)
Potassium: 4.1 mEq/L (ref 3.5–5.1)
Sodium: 140 mEq/L (ref 135–145)

## 2018-06-18 NOTE — Therapy (Signed)
[facility] [place], [place], [address] Phone: [phone]   Fax:  [phone] Progress Note Reporting Period 05/17/18 to 06/18/18 for the first 10 visits  See note below for Objective Data and Assessment of Progress/Goals.      Physical Therapy Treatment  Patient Details  Name: Madison Palmer MRN: [id number] Date of Birth: 24-Dec-1965 Referring Provider (PT): Mardelle Matte   Encounter Date: 06/18/2018  PT End of Session - 06/18/18 1233    Visit Number  10    Date for PT Re-Evaluation  07/05/18    PT Start Time  1147    PT Stop Time  1246    PT Time Calculation (min)  59 min    Activity Tolerance  Patient tolerated treatment well    Behavior During Therapy  [facility] for tasks assessed/performed       Past Medical History:  Diagnosis Date  . Anemia   . Anxiety   . Arrhythmia    tachycardia  . Arthritis   . Chickenpox   . Depression   . Diverticulitis   . GERD (gastroesophageal reflux disease)   . Glaucoma   . Hyperlipidemia   . Hyperparathyroidism ([place])   . Hypertension   . Inflammatory polyps of colon ([place])   . LVH (left ventricular hypertrophy)   . Lymphedema   . PCOS (polycystic ovarian syndrome)   . Prediabetes   . Recurrent UTI   . Sleep apnea    CPAP  . Thyroid disease   . Vitamin D deficiency     Past Surgical History:  Procedure Laterality Date  . BREAST BIOPSY  2015  . CESAREAN SECTION  2004  . INNER EAR SURGERY     ear and sinus surgery  . LAPAROSCOPIC REPAIR AND REMOVAL OF GASTRIC BAND    . OOPHORECTOMY Left   . TONSILLECTOMY AND ADENOIDECTOMY      There were no vitals filed for this visit.  Subjective Assessment - 06/18/18 1244    Subjective  Pt reports that she is feeling fine today    Currently in Pain?  Yes    Pain Score  4     Pain Location  Knee    Pain Orientation  Right;Left                       [facility] Adult PT Treatment/Exercise - 06/18/18 0001       Ambulation/Gait   Gait Comments  Pt ambulated into clinic 100 feet then one seated rest break, short of breath, another 100 feet into clinic pt turen oxygen up for the second 100 ft.       Lumbar Exercises: Aerobic   Nustep  L4 x6 min       Lumbar Exercises: Standing   Row  Theraband;Both;Strengthening;15 reps   x2   Theraband Level (Row)  Level 3 (Green)    Other Standing Lumbar Exercises  triceps ext 20lb x20    Other Standing Lumbar Exercises  Alt 4 inch box taps x20       Lumbar Exercises: Seated   Sit to Stand  20 reps    Other Seated Lumbar Exercises  Green Tband ankle pumps x20      Moist Heat Therapy   Number Minutes Moist Heat  15 Minutes    Moist Heat Location  Cervical;Lumbar Spine      Electrical Stimulation   Electrical Stimulation Location  cervical and low R back  Electrical Stimulation Action  pre mod    Electrical Stimulation Parameters  sitting    Electrical Stimulation Goals  Pain               PT Short Term Goals - 05/20/18 1429      PT SHORT TERM GOAL #1   Title  independent with initial HEP    Status  Achieved        PT Long Term Goals - 06/18/18 1234      PT LONG TERM GOAL #1   Title  understand proper body mechanics for ADL's    Status  Partially Met      PT LONG TERM GOAL #2   Title  increase lumbar ROM 25%    Status  Achieved      PT LONG TERM GOAL #3   Title  decrease pain 50%    Status  Partially Met            Plan - 06/18/18 1235    Clinical Impression Statement  Pt ambulated in clinic for the first time with one seated rest break. Pt tolerated all interventions well. Some fatigue with sit to stands. One bout of knee pain on NuStep Warm up. Some foot clearance issues with alternating box taps.     PT Frequency  2x / week    PT Duration  8 weeks    PT Treatment/Interventions  ADLs/Self Care Home Management;Cryotherapy;Electrical Stimulation;Moist Heat;Functional mobility training;Therapeutic  activities;Therapeutic exercise;Balance training;Neuromuscular re-education;Manual techniques;Patient/family education;Dry needling    PT Next Visit Plan  exercises as tolerated, try some gait trials.        Patient will benefit from skilled therapeutic intervention in order to improve the following deficits and impairments:  Abnormal gait, Decreased range of motion, Difficulty walking, Increased muscle spasms, Pain, Impaired flexibility, Improper body mechanics, Decreased strength, Decreased mobility, Obesity  Visit Diagnosis: Difficulty in walking, not elsewhere classified  Chronic pain of left knee  Chronic pain of right knee  Cervicalgia     Problem List Patient Active Problem List   Diagnosis Date Noted  . Chronic diastolic heart failure ([place]) 05/15/2018  . Dyspnea 05/15/2018  . Pulmonary nodules 05/11/2018  . Hypertensive urgency 05/11/2018  . Chronic respiratory failure with hypoxia ([place]) 05/10/2018  . 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 DDD (degenerative disc disease), lumbar 03/21/2018  . Primary osteoarthritis of both knees 03/21/2018  . Dysphagia 03/19/2018  . PVC (premature ventricular contraction) 03/12/2018  . LVH (left ventricular hypertrophy) 03/12/2018  . Generalized edema 03/12/2018  . Bilateral carotid artery stenosis 03/12/2018  . Weight gain 01/25/2018  . Hemorrhoids 01/05/2018  . Generalized postprandial abdominal pain 01/05/2018  . Hiatal hernia 01/05/2018  . Esophageal dysmotilities 01/05/2018  . Mixed hyperlipidemia 09/12/2017  . Multinodular goiter 07/27/2017  . Hyperparathyroidism ([place]) 06/13/2017  . Vitamin D deficiency 06/13/2017  . Hx of adenomatous colonic polyps 05/15/2017  . Eustachian tube dysfunction, right 05/15/2017  . Lymphatic edema 05/15/2017  . Hypertension 05/14/2017  . Anemia 05/11/2017  . Arrhythmia 05/11/2017  . Diverticulosis 05/11/2017  . GERD (gastroesophageal reflux disease) 05/11/2017   . Hypothyroidism 05/11/2017  . Lower extremity edema 05/11/2017  . Morbid obesity ([place]) 05/11/2017  . Polycystic ovary syndrome 05/11/2017  . OSA on CPAP 05/11/2017  . Neck pain 05/11/2017  . Allergic rhinitis 05/11/2017  . 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

## 2018-06-20 ENCOUNTER — Ambulatory Visit: Payer: BLUE CROSS/BLUE SHIELD | Admitting: Physical Therapy

## 2018-06-20 ENCOUNTER — Encounter: Payer: Self-pay | Admitting: Physical Therapy

## 2018-06-20 DIAGNOSIS — M25562 Pain in left knee: Secondary | ICD-10-CM | POA: Diagnosis not present

## 2018-06-20 DIAGNOSIS — G8929 Other chronic pain: Secondary | ICD-10-CM

## 2018-06-20 DIAGNOSIS — R262 Difficulty in walking, not elsewhere classified: Secondary | ICD-10-CM

## 2018-06-20 DIAGNOSIS — M25561 Pain in right knee: Secondary | ICD-10-CM

## 2018-06-20 NOTE — Therapy (Signed)
[facility] [place], [place], [address] Phone: [phone]   Fax:  [phone]  Physical Therapy Treatment  Patient Details  Name: Madison Palmer MRN: [id number] Date of Birth: [birth year] Referring Provider (PT): Mardelle Matte   Encounter Date: 06/20/2018  PT End of Session - 06/20/18 1237    Visit Number  11    Date for PT Re-Evaluation  07/05/18    PT Start Time  1607    PT Stop Time  1247    PT Time Calculation (min)  54 min    Activity Tolerance  Patient tolerated treatment well    Behavior During Therapy  [facility] for tasks assessed/performed       Past Medical History:  Diagnosis Date  . Anemia   . Anxiety   . Arrhythmia    tachycardia  . Arthritis   . Chickenpox   . Depression   . Diverticulitis   . GERD (gastroesophageal reflux disease)   . Glaucoma   . Hyperlipidemia   . Hyperparathyroidism ([place])   . Hypertension   . Inflammatory polyps of colon ([place])   . LVH (left ventricular hypertrophy)   . Lymphedema   . PCOS (polycystic ovarian syndrome)   . Prediabetes   . Recurrent UTI   . Sleep apnea    CPAP  . Thyroid disease   . Vitamin D deficiency     Past Surgical History:  Procedure Laterality Date  . BREAST BIOPSY  2015  . CESAREAN SECTION  2004  . INNER EAR SURGERY     ear and sinus surgery  . LAPAROSCOPIC REPAIR AND REMOVAL OF GASTRIC BAND    . OOPHORECTOMY Left   . TONSILLECTOMY AND ADENOIDECTOMY      There were no vitals filed for this visit.  Subjective Assessment - 06/20/18 1202    Subjective  Pt reports some pain across her shoulder that intensified the same night of her last PT session. Pt stated that she had some low back pain yesterday making it difficult got her to clean out her refrigerator    Currently in Pain?  Yes    Pain Score  7     Pain Location  Shoulder    Pain Orientation  Right;Left                       [facility] Adult PT Treatment/Exercise  - 06/20/18 0001      Ambulation/Gait   Gait Comments  Pt ambulated into clinic 100 feet then one seated rest break due to knee pain.another 100 feet into clinic pt turen oxygen up for the second 100 ft.       High Level Balance   High Level Balance Activities  Side stepping      Lumbar Exercises: Aerobic   UBE (Upper Arm Bike)  L3 3 min each       Lumbar Exercises: Standing   Other Standing Lumbar Exercises  triceps ext 20lb 2x20    Other Standing Lumbar Exercises  Standing march 5lb 2x10       Lumbar Exercises: Seated   Long Arc Quad on Chair  Both;Weights;2 sets;10 reps    LAQ on Chair Weights (lbs)  5      Moist Heat Therapy   Number Minutes Moist Heat  15 Minutes    Moist Heat Location  Cervical;Lumbar Spine      Electrical Stimulation   Electrical Stimulation Location  cervical and mid  back    Electrical Stimulation Action  pre-mod    Electrical Stimulation Parameters  sitting    Electrical Stimulation Goals  Pain               PT Short Term Goals - 05/20/18 1429      PT SHORT TERM GOAL #1   Title  independent with initial HEP    Status  Achieved        PT Long Term Goals - 06/20/18 1243      PT LONG TERM GOAL #1   Title  understand proper body mechanics for ADL's    Status  Achieved      PT LONG TERM GOAL #2   Title  increase lumbar ROM 25%    Status  Achieved            Plan - 06/20/18 1238    Clinical Impression Statement  Knee pain reported when ambulating in. Pt did express that her knees are bone on bone. Some increase in knee pain also reported with side steps. Pt able to tolerated LAQ and marching with increase load but it was taxing on pt    History and Personal Factors relevant to plan of care:  obesity, lymphedema    Rehab Potential  Good    PT Frequency  2x / week    PT Duration  8 weeks    PT Next Visit Plan  exercises as tolerated, try some gait trials.        Patient will benefit from skilled therapeutic intervention in  order to improve the following deficits and impairments:  Abnormal gait, Decreased range of motion, Difficulty walking, Increased muscle spasms, Pain, Impaired flexibility, Improper body mechanics, Decreased strength, Decreased mobility, Obesity  Visit Diagnosis: Chronic pain of left knee  Chronic pain of right knee  Difficulty in walking, not elsewhere classified     Problem List Patient Active Problem List   Diagnosis Date Noted  . Chronic diastolic heart failure ([place]) 05/15/2018  . Dyspnea 05/15/2018  . Pulmonary nodules 05/11/2018  . Hypertensive urgency 05/11/2018  . Chronic respiratory failure with hypoxia ([place]) 05/10/2018  . 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 DDD (degenerative disc disease), lumbar 03/21/2018  . Primary osteoarthritis of both knees 03/21/2018  . Dysphagia 03/19/2018  . PVC (premature ventricular contraction) 03/12/2018  . LVH (left ventricular hypertrophy) 03/12/2018  . Generalized edema 03/12/2018  . Bilateral carotid artery stenosis 03/12/2018  . Weight gain 01/25/2018  . Hemorrhoids 01/05/2018  . Generalized postprandial abdominal pain 01/05/2018  . Hiatal hernia 01/05/2018  . Esophageal dysmotilities 01/05/2018  . Mixed hyperlipidemia 09/12/2017  . Multinodular goiter 07/27/2017  . Hyperparathyroidism ([place]) 06/13/2017  . Vitamin D deficiency 06/13/2017  . Hx of adenomatous colonic polyps 05/15/2017  . Eustachian tube dysfunction, right 05/15/2017  . Lymphatic edema 05/15/2017  . Hypertension 05/14/2017  . Anemia 05/11/2017  . Arrhythmia 05/11/2017  . Diverticulosis 05/11/2017  . GERD (gastroesophageal reflux disease) 05/11/2017  . Hypothyroidism 05/11/2017  . Lower extremity edema 05/11/2017  . Morbid obesity ([place]) 05/11/2017  . Polycystic ovary syndrome 05/11/2017  . OSA on CPAP 05/11/2017  . Neck pain 05/11/2017  . Allergic rhinitis 05/11/2017  . 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

## 2018-06-23 NOTE — Progress Notes (Signed)
Cardiology Office Note   Date:  06/24/2018   ID:  Madison Palmer 1965/09/03, MRN [id number]  PCP:  Madison Buffy, NP  Cardiologist:   Madison Breeding, MD   Chief Complaint  Patient presents with  . Shortness of Breath      History of Present Illness: Madison Palmer is a 53 y.o. female who is referred by [name], Charlene Brooke, NP for evaluation of difficult to control HTN and LVH.   This was done when she was noted to have ventricular bigeminy.  She did have an echo in Nov with a normal EF of 60 - 65% with severe concentric LVH.   She also is noted to have a past history of difficult to control hypertension.  She moved here from [place].  She has had LVH with preserved ejection fraction.  She had some mild internal carotid artery plaque noted on Doppler in the past.  She had a perfusion study in 2018 that demonstrated no evidence of ischemia or infarct.  She is been managed for her hypertension.   She was in the hospital and discharged on 1/13.  She had respiratory failure.  She was noted to have multiple pulmonary nodules.  She was seen by pulmonary.   She was felt that it was less likely that she had acute diastolic HF.   Since I last saw her she has seen her PCP and had increased weight noted and her Lasix was increased.  I reviewed these and pulmonary records for this visit.  She saw Madison Palmer and is being treated with steroids for pulmonary nodules.  There was a discussion about bronchoscopy but they held off because of previous difficulty with this.  .  She is having routine PT.  She remains anxious about her health.  She wonders about her volume.  She took the increase Lasix for a few days.  Her potassium stayed okay but her creatinine went up very slightly.  She thinks that her swelling is about the same.  Her dyspnea is about the same.  Her weights are unchanged.  She did have an episode of tachycardia today while at physical therapy.  She said it went up to 130 and stayed  there for a while but they could not record it other than with a pulse ox.  She is not had any presyncope or syncope.  She supposed to be wearing her oxygen as needed but she says she is wearing it most of the time.  She denies any chest pressure, neck or arm discomfort.  She continues to following a healthy weight program.   Past Medical History:  Diagnosis Date  . Anemia   . Anxiety   . Arrhythmia    tachycardia  . Arthritis   . Chickenpox   . Depression   . Diverticulitis   . GERD (gastroesophageal reflux disease)   . Glaucoma   . Hyperlipidemia   . Hyperparathyroidism ([place])   . Hypertension   . Inflammatory polyps of colon ([place])   . LVH (left ventricular hypertrophy)   . Lymphedema   . PCOS (polycystic ovarian syndrome)   . Prediabetes   . Recurrent UTI   . Sleep apnea    CPAP  . Thyroid disease   . Vitamin D deficiency     Past Surgical History:  Procedure Laterality Date  . BREAST BIOPSY  2015  . CESAREAN SECTION  2004  . INNER EAR SURGERY     ear and sinus  surgery  . LAPAROSCOPIC REPAIR AND REMOVAL OF GASTRIC BAND    . OOPHORECTOMY Left   . TONSILLECTOMY AND ADENOIDECTOMY       Current Outpatient Medications  Medication Sig Dispense Refill  . acetaminophen (TYLENOL) 500 MG tablet Take 1,000 mg by mouth 3 (three) times daily as needed for moderate pain or headache.    Marland Kitchen atenolol (TENORMIN) 25 MG tablet Take 1 tablet (25 mg total) by mouth 2 (two) times daily. 180 tablet 3  . benzonatate (TESSALON) 100 MG capsule Take 1 capsule (100 mg total) by mouth 3 (three) times daily as needed for cough. 20 capsule 0  . cetirizine (ZYRTEC ALLERGY) 10 MG tablet Take 1 tablet (10 mg total) by mouth daily. (Patient taking differently: Take 10 mg by mouth as needed for allergies. Use as needed for allergies) 30 tablet 5  . esomeprazole (NEXIUM) 40 MG capsule TAKE ONE CAPSULE BY MOUTH TWICE DAILY 180 capsule 0  . fluticasone (FLONASE) 50 MCG/ACT nasal spray Place 2 sprays into  both nostrils daily.    . furosemide (LASIX) 40 MG tablet Take 1 tablet (40 mg total) by mouth 2 (two) times daily. 180 tablet 1  . hydrALAZINE (APRESOLINE) 50 MG tablet Take 1tab in morning and afternoon, and 2tabs at bedtime (Patient taking differently: Take 2tab in morning ,and 2tabs at bedtime) 120 tablet 6  . levothyroxine (SYNTHROID, LEVOTHROID) 75 MCG tablet TK 1 T PO QD  MONDAY-FRIDAY  0  . lisinopril (PRINIVIL,ZESTRIL) 20 MG tablet Take 1 tablet (20 mg total) by mouth daily. 90 tablet 3  . potassium chloride SA (K-DUR,KLOR-CON) 20 MEQ tablet Take 2 tablets (40 mEq total) by mouth daily. 180 tablet 2  . predniSONE (DELTASONE) 5 MG tablet Take 4 tabs for 7 days, then drop to 3 tabs for 7 days, then drop and remain at 2 tabs until OV 90 tablet 0  . Vitamin D, Ergocalciferol, (DRISDOL) 1.25 MG (50000 UT) CAPS capsule TAKE 1 CAPSULE BY MOUTH EVERY 7 DAYS 12 capsule 0  . metFORMIN (GLUCOPHAGE) 500 MG tablet Take 1 tablet (500 mg total) by mouth daily with breakfast. 30 tablet 0   No current facility-administered medications for this visit.     Allergies:   Fentanyl; Levofloxacin; Midazolam; Pollen extract; Atorvastatin; and Rosuvastatin    ROS:  Please see the history of present illness.   Otherwise, review of systems are positive for none.   All other systems are reviewed and negative.    PHYSICAL EXAM: VS:  BP (!) 179/90   Pulse 82   Ht 5\' 2"  (1.575 m)   Wt (!) 340 lb (154.2 kg)   LMP 05/02/2017   BMI 62.19 kg/m  , BMI Body mass index is 62.19 kg/m. GEN:  No distress NECK:  No jugular venous distention at 90 degrees, waveform within normal limits, carotid upstroke brisk and symmetric, no bruits, no thyromegaly LYMPHATICS:  No cervical adenopathy LUNGS:  Clear to auscultation bilaterally BACK:  No CVA tenderness CHEST:  Unremarkable HEART:  S1 and S2 within normal limits, no S3, no S4, no clicks, no rubs, no murmurs ABD:  Positive bowel sounds normal in frequency in pitch, no  bruits, no rebound, no guarding, unable to assess midline mass or bruit with the patient seated. EXT:  2 plus pulses throughout, moderate edema, no cyanosis no clubbing SKIN:  No rashes no nodules NEURO:  Cranial nerves II through XII grossly intact, motor grossly intact throughout PSYCH:  Cognitively intact, oriented to person place  and time   EKG:  EKG is not ordered today.   Recent Labs: 02/20/2018: Pro B Natriuretic peptide (BNP) 173.0 05/10/2018: B Natriuretic Peptide 199.8 05/11/2018: ALT 39; TSH 0.938 05/13/2018: Hemoglobin 12.9; Magnesium 2.1; Platelets 253 06/18/2018: BUN 28; Creatinine, Ser 0.91; Potassium 4.1; Sodium 140    Lipid Panel    Component Value Date/Time   CHOL 196 02/27/2018 1233   TRIG 142 02/27/2018 1233   HDL 46 02/27/2018 1233   CHOLHDL 5 09/10/2017 1156   VLDL 26.6 09/10/2017 1156   LDLCALC 122 (H) 02/27/2018 1233      Wt Readings from Last 3 Encounters:  06/24/18 (!) 340 lb (154.2 kg)  06/13/18 (!) 341 lb (154.7 kg)  05/30/18 (!) 330 lb (149.7 kg)      Other studies Reviewed: Additional studies/ records that were reviewed today include:  Pulmonary and primary care office notes Review of the above records demonstrates:  See above   ASSESSMENT AND PLAN:  HTN:   Blood pressure is elevated and increase her lisinopril 20 mg daily.  We will keep a watch on her kidney function by checking her basic metabolic profile in 2 weeks.   PULMONARY NODULES:   This is being treated as above with possible plans for biopsy.  PET scan was done.  There was hypermetabolic activity.  If she has continued shortness of breath despite current therapy is found might consider right heart cath.  LVH:  PYP scan was negative.  I will follow-up on this and continue to manage with blood pressure control.  EDEMA:   And then increase her Lasix back to 40 mg twice a day.  She took this for several days but went back down to 40 once a day.  I think she will tolerate twice a day  with increase potassium to 40 mEq daily.  CHRONIC DIASTOLIC HF: This is being managed as above.  We again talked about salt restriction.  OBESITY:   She is going to [facility].  CORONARY CALCIUM:  This was present on PET scanning.  I will follow this with consideration of further testing but at this point she is not having any anginal symptoms.  Just needs risk reduction.  CAROTID STENOSIS: She has some mild stenosis noted when she was in [place].  It is been 2 years ago and she is due for follow-up carotid Dopplers.  We will order these.   Current medicines are reviewed at length with the patient today.  The patient does not have concerns regarding medicines.  The following changes have been made:  As above  Labs/ tests ordered today include:    Orders Placed This Encounter  Procedures  . Basic Metabolic Panel (BMET)     Disposition:   FU with me in 6  weeks.    Signed, Madison Breeding, MD  06/24/2018 2:35 PM    [facility]

## 2018-06-24 ENCOUNTER — Encounter: Payer: Self-pay | Admitting: Cardiology

## 2018-06-24 ENCOUNTER — Ambulatory Visit: Payer: BLUE CROSS/BLUE SHIELD | Admitting: Cardiology

## 2018-06-24 ENCOUNTER — Encounter: Payer: Self-pay | Admitting: Physical Therapy

## 2018-06-24 ENCOUNTER — Ambulatory Visit: Payer: BLUE CROSS/BLUE SHIELD | Admitting: Physical Therapy

## 2018-06-24 VITALS — BP 179/90 | HR 82 | Ht 62.0 in | Wt 340.0 lb

## 2018-06-24 DIAGNOSIS — G8929 Other chronic pain: Secondary | ICD-10-CM

## 2018-06-24 DIAGNOSIS — R262 Difficulty in walking, not elsewhere classified: Secondary | ICD-10-CM

## 2018-06-24 DIAGNOSIS — R0989 Other specified symptoms and signs involving the circulatory and respiratory systems: Secondary | ICD-10-CM | POA: Insufficient documentation

## 2018-06-24 DIAGNOSIS — I6523 Occlusion and stenosis of bilateral carotid arteries: Secondary | ICD-10-CM | POA: Diagnosis not present

## 2018-06-24 DIAGNOSIS — I10 Essential (primary) hypertension: Secondary | ICD-10-CM

## 2018-06-24 DIAGNOSIS — M25562 Pain in left knee: Principal | ICD-10-CM

## 2018-06-24 DIAGNOSIS — Z79899 Other long term (current) drug therapy: Secondary | ICD-10-CM

## 2018-06-24 DIAGNOSIS — I5032 Chronic diastolic (congestive) heart failure: Secondary | ICD-10-CM

## 2018-06-24 DIAGNOSIS — M25561 Pain in right knee: Secondary | ICD-10-CM

## 2018-06-24 DIAGNOSIS — M542 Cervicalgia: Secondary | ICD-10-CM

## 2018-06-24 MED ORDER — POTASSIUM CHLORIDE CRYS ER 20 MEQ PO TBCR: 40.0000 meq | EXTENDED_RELEASE_TABLET | Freq: Every day | ORAL | 2 refills | Status: DC

## 2018-06-24 MED ORDER — LISINOPRIL 20 MG PO TABS: 20.0000 mg | ORAL_TABLET | Freq: Every day | ORAL | 3 refills | Status: DC

## 2018-06-24 MED ORDER — FUROSEMIDE 40 MG PO TABS: 40.0000 mg | ORAL_TABLET | Freq: Two times a day (BID) | ORAL | 1 refills | Status: DC

## 2018-06-24 NOTE — Patient Instructions (Signed)
Medication Instructions:  INCREASE- Furosemide 40 mg twice a day INCREASE- Lisinopril 20 mg daily INCREASE- Potassium 40 mg daily  If you need a refill on your cardiac medications before your next appointment, please call your pharmacy.  Labwork: BMP in 2 weeks HERE IN OUR OFFICE AT [facility] You will NOT need to fast   Take the provided lab slips with you to the lab for your blood draw.   When you have your labs (blood work) drawn today and your tests are completely normal, you will receive your results only by MyChart Message (if you have MyChart) -OR-  A paper copy in the mail.  If you have any lab test that is abnormal or we need to change your treatment, we will call you to review these results.  Testing/Procedures: Your physician has requested that you have a carotid duplex. This test is an ultrasound of the carotid arteries in your neck. It looks at blood flow through these arteries that supply the brain with blood. Allow one hour for this exam. There are no restrictions or special instructions.   Follow-Up: . Your physician recommends that you schedule a follow-up appointment in: [place], you and your health needs are our priority.  As part of our continuing mission to provide you with exceptional heart care, we have created designated Provider Care Teams.  These Care Teams include your primary Cardiologist (physician) and Advanced Practice Providers (APPs -  Physician Assistants and Nurse Practitioners) who all work together to provide you with the care you need, when you need it.  Thank you for choosing [facility] at [facility]!!

## 2018-06-24 NOTE — Therapy (Signed)
[address] [place], [place], [address] Phone: [phone]   Fax:  [phone]  Physical Therapy Treatment  Patient Details  Name: Madison Palmer MRN: [id number] Date of Birth: 1965/07/26 Referring Provider (PT): Mardelle Matte   Encounter Date: 06/24/2018  PT End of Session - 06/24/18 0931    Visit Number  12    Date for PT Re-Evaluation  07/05/18    PT Start Time  0852    PT Stop Time  0943    PT Time Calculation (min)  51 min    Activity Tolerance  Patient tolerated treatment well    Behavior During Therapy  [facility] for tasks assessed/performed       Past Medical History:  Diagnosis Date  . Anemia   . Anxiety   . Arrhythmia    tachycardia  . Arthritis   . Chickenpox   . Depression   . Diverticulitis   . GERD (gastroesophageal reflux disease)   . Glaucoma   . Hyperlipidemia   . Hyperparathyroidism ([place])   . Hypertension   . Inflammatory polyps of colon ([place])   . LVH (left ventricular hypertrophy)   . Lymphedema   . PCOS (polycystic ovarian syndrome)   . Prediabetes   . Recurrent UTI   . Sleep apnea    CPAP  . Thyroid disease   . Vitamin D deficiency     Past Surgical History:  Procedure Laterality Date  . BREAST BIOPSY  2015  . CESAREAN SECTION  2004  . INNER EAR SURGERY     ear and sinus surgery  . LAPAROSCOPIC REPAIR AND REMOVAL OF GASTRIC BAND    . OOPHORECTOMY Left   . TONSILLECTOMY AND ADENOIDECTOMY      There were no vitals filed for this visit.  Subjective Assessment - 06/24/18 0856    Subjective  "I hurt but we all right" "I hurt in the neck, and all up in the shoulder all weekend"    Currently in Pain?  Yes    Pain Score  6     Pain Location  Shoulder    Pain Orientation  Right;Left                       [facility] Adult PT Treatment/Exercise - 06/24/18 0001      Lumbar Exercises: Aerobic   UBE (Upper Arm Bike)  L3 3 min each       Lumbar Exercises:  Standing   Row  Theraband;Both;Strengthening;15 reps   x2   Theraband Level (Row)  Level 4 (Blue)    Other Standing Lumbar Exercises  Resisted side step 40lb x 2 each side    had to discontinue after 2 sets with resistance on L pull R     Lumbar Exercises: Seated   Long Arc Quad on Chair  Both;Weights;2 sets;10 reps    LAQ on Chair Weights (lbs)  3    Sit to Stand  10 reps   x2 holding blue ball    Other Seated Lumbar Exercises  blue Tband HS curls x20 each      Moist Heat Therapy   Number Minutes Moist Heat  15 Minutes    Moist Heat Location  Cervical      Electrical Stimulation   Electrical Stimulation Location  cervical    Electrical Stimulation Action  IFC    Electrical Stimulation Parameters  sitting    Electrical Stimulation Goals  Pain  PT Short Term Goals - 05/20/18 1429      PT SHORT TERM GOAL #1   Title  independent with initial HEP    Status  Achieved        PT Long Term Goals - 06/24/18 0901      PT LONG TERM GOAL #4   Title  report able to do her normal housework without increase pain    Status  Partially Met            Plan - 06/24/18 0931    Clinical Impression Statement  Pt ~ 7 minutes late for today's treatment. She was able to ambulate into clinic for the first time without therapist near by. She is progressing towards all goals. She reports more energy at home. She was able to complete all interventions. Discontinue resisted side steps when facing R side due to R knee pain, pt stated that it felt like her R knee give out. While on e-Stim pt reports  that her tachycardia has started HR was ~ 120- 127. She stated that it happens when it wants too. Pt stayed in clinic ~ 40 minutes, took her medicine and went to the restroom and was able to get her HR down to 83 before leaving     Rehab Potential  Good    PT Frequency  2x / week    PT Duration  8 weeks    PT Treatment/Interventions  ADLs/Self Care Home  Management;Cryotherapy;Electrical Stimulation;Moist Heat;Functional mobility training;Therapeutic activities;Therapeutic exercise;Balance training;Neuromuscular re-education;Manual techniques;Patient/family education;Dry needling    PT Next Visit Plan  exercises as tolerated, try some gait trials.        Patient will benefit from skilled therapeutic intervention in order to improve the following deficits and impairments:  Abnormal gait, Decreased range of motion, Difficulty walking, Increased muscle spasms, Pain, Impaired flexibility, Improper body mechanics, Decreased strength, Decreased mobility, Obesity  Visit Diagnosis: Chronic pain of left knee  Chronic pain of right knee  Difficulty in walking, not elsewhere classified  Cervicalgia     Problem List Patient Active Problem List   Diagnosis Date Noted  . Chronic diastolic heart failure ([place]) 05/15/2018  . Dyspnea 05/15/2018  . Pulmonary nodules 05/11/2018  . Hypertensive urgency 05/11/2018  . Chronic respiratory failure with hypoxia ([place]) 05/10/2018  . 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 DDD (degenerative disc disease), lumbar 03/21/2018  . Primary osteoarthritis of both knees 03/21/2018  . Dysphagia 03/19/2018  . PVC (premature ventricular contraction) 03/12/2018  . LVH (left ventricular hypertrophy) 03/12/2018  . Generalized edema 03/12/2018  . Bilateral carotid artery stenosis 03/12/2018  . Weight gain 01/25/2018  . Hemorrhoids 01/05/2018  . Generalized postprandial abdominal pain 01/05/2018  . Hiatal hernia 01/05/2018  . Esophageal dysmotilities 01/05/2018  . Mixed hyperlipidemia 09/12/2017  . Multinodular goiter 07/27/2017  . Hyperparathyroidism ([place]) 06/13/2017  . Vitamin D deficiency 06/13/2017  . Hx of adenomatous colonic polyps 05/15/2017  . Eustachian tube dysfunction, right 05/15/2017  . Lymphatic edema 05/15/2017  . Hypertension 05/14/2017  . Anemia 05/11/2017  . Arrhythmia  05/11/2017  . Diverticulosis 05/11/2017  . GERD (gastroesophageal reflux disease) 05/11/2017  . Hypothyroidism 05/11/2017  . Lower extremity edema 05/11/2017  . Morbid obesity ([place]) 05/11/2017  . Polycystic ovary syndrome 05/11/2017  . OSA on CPAP 05/11/2017  . Neck pain 05/11/2017  . Allergic rhinitis 05/11/2017  . 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

## 2018-06-26 ENCOUNTER — Encounter: Payer: Self-pay | Admitting: Pulmonary Disease

## 2018-06-26 ENCOUNTER — Ambulatory Visit (INDEPENDENT_AMBULATORY_CARE_PROVIDER_SITE_OTHER): Payer: BLUE CROSS/BLUE SHIELD | Admitting: Pulmonary Disease

## 2018-06-26 DIAGNOSIS — Z9989 Dependence on other enabling machines and devices: Secondary | ICD-10-CM

## 2018-06-26 DIAGNOSIS — G4733 Obstructive sleep apnea (adult) (pediatric): Secondary | ICD-10-CM

## 2018-06-26 DIAGNOSIS — J9611 Chronic respiratory failure with hypoxia: Secondary | ICD-10-CM

## 2018-06-26 DIAGNOSIS — R918 Other nonspecific abnormal finding of lung field: Secondary | ICD-10-CM

## 2018-06-26 NOTE — Progress Notes (Signed)
Subjective:    Patient ID: Madison Palmer, female    DOB: May 06, 1965, 53 y.o.   MRN: [id number]  HPI  53 yo morbidly obese woman with chronic lymphedema for FU of severeobstructive sleep apnea & pulm nodules. Noted 05/2018   She underwent lap band in 2009 and lost weight and CPAP was then decreased to 9 cm in 06/2009.lap band was removed in 2013. PMH - laryngospasm with prior surgery   Chief Complaint  Patient presents with  . Cough    Worse when laying down to sleep or when exerting herself.     She was admitted 05/10/18 for asymptomatic hypoxia with hypertensive urgency 200/100, diuresed but continues to require 1 L of oxygen.   She is compliant with oxygen and her CPAP continues to complain of shortness of breath on exertion. Lasix is now increased to 40 mg twice daily weight has fluctuated within 5 to 10 pounds She is planning to go on a cruise in May and wonders whether she can take oxygen with her, she currently has a Marine scientist. After her last visit 06/2018 we put her on a trial of prednisone after deciding against a biopsy.  She tolerated 20 mg of prednisone and is now down to 10 mg.  She has not noted any dramatic improvement but does state that she tolerates drop in oxygen level better.  She brings in her prior CT chest from 2010 and 2011 which I reviewed which shows multiple noncalcified pulmonary nodules 3 to 6 mm, largest being 6 mm  CPAP download was reviewed which shows excellent control of events at 9 cm with good compliance and minimal leak    Significant tests/ events reviewed  CT angiogram 05/10/18  was negative for pulmonary emboli, but showed numerous pulmonary nodules largest 15 mm in the right lower lobe and 13 mm in the lingula. Mosaic attenuation was also noted  suggesting air trapping    PET 05/24/18  low-grade hypermetabolism SUV 3-4 range in the nodules.  Mediastinum did not show any hypermetabolic  NP SG 2924 showed AHI of  100/hour which was corrected by CPAP of 14 cm.  rheumatology evaluation >> positive ANA and positive RA factor but negative CCP and no evidence of synovitis , ENA neg ACE 40  Past Medical History:  Diagnosis Date  . Anemia   . Anxiety   . Arrhythmia    tachycardia  . Arthritis   . Chickenpox   . Depression   . Diverticulitis   . GERD (gastroesophageal reflux disease)   . Glaucoma   . Hyperlipidemia   . Hyperparathyroidism ([place])   . Hypertension   . Inflammatory polyps of colon ([place])   . LVH (left ventricular hypertrophy)   . Lymphedema   . PCOS (polycystic ovarian syndrome)   . Prediabetes   . Recurrent UTI   . Sleep apnea    CPAP  . Thyroid disease   . Vitamin D deficiency      Review of Systems neg for any significant sore throat, dysphagia, itching, sneezing, nasal congestion or excess/ purulent secretions, fever, chills, sweats, unintended wt loss, pleuritic or exertional cp, hempoptysis, orthopnea pnd or change in chronic leg swelling. Also denies presyncope, palpitations, heartburn, abdominal pain, nausea, vomiting, diarrhea or change in bowel or urinary habits, dysuria,hematuria, rash, arthralgias, visual complaints, headache, numbness weakness or ataxia.     Objective:   Physical Exam  Gen. Pleasant, obese, in no distress, normal affect ENT - no pallor,icterus, no post nasal  drip, class 2-3 airway Neck: No JVD, no thyromegaly, no carotid bruits Lungs: no use of accessory muscles, no dullness to percussion, decreased without rales or rhonchi  Cardiovascular: Rhythm regular, heart sounds  normal, no murmurs or gallops, no peripheral edema Abdomen: soft and non-tender, no hepatosplenomegaly, BS normal. Musculoskeletal: No deformities, no cyanosis or clubbing Neuro:  alert, non focal, no tremors       Assessment & Plan:

## 2018-06-26 NOTE — Assessment & Plan Note (Signed)
CPAP is working well on current settings. She is compliant and this is certainly helped her daytime somnolence and fatigue  Weight loss encouraged, compliance with goal of at least 4-6 hrs every night is the expectation. Advised against medications with sedative side effects Cautioned against driving when sleepy - understanding that sleepiness will vary on a day to day basis

## 2018-06-26 NOTE — Patient Instructions (Addendum)
Stay on 10 mg prednisone, 2 tablets  On March 15, decrease to 1 tablet daily  Check with [employer] if they can get you continuous oxygen for the cruise.  CPAP is set at 9 cm and is working well.  CT chest with contrast in first week of April  Blood work for nodule- [employer]

## 2018-06-26 NOTE — Assessment & Plan Note (Addendum)
Again favor benign etiology. Pretest probability is less than 10%, will proceed with diagnostic testing and hopefully negative testing will be reassuring. 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

## 2018-06-26 NOTE — Assessment & Plan Note (Addendum)
Continue 1 L continuous on exertion and during sleep. On her next visit we will check OSA on CPAP/room air to decide whether she needs to take oxygen along with her on her cruise in May

## 2018-06-27 ENCOUNTER — Telehealth: Payer: Self-pay | Admitting: Pulmonary Disease

## 2018-06-27 ENCOUNTER — Ambulatory Visit: Payer: BLUE CROSS/BLUE SHIELD | Attending: Nurse Practitioner | Admitting: Occupational Therapy

## 2018-06-27 DIAGNOSIS — I890 Lymphedema, not elsewhere classified: Secondary | ICD-10-CM

## 2018-06-27 MED ORDER — PREDNISONE 5 MG PO TABS: 10.0000 mg | ORAL_TABLET | Freq: Every day | ORAL | 2 refills | Status: DC

## 2018-06-27 NOTE — Therapy (Signed)
[place] MAIN [facility] SERVICES [address] [place], [place], [address] Phone: [phone]   Fax:  [phone]  Occupational Therapy Treatment Note and Discharge Summary: Lymphedema Care  Patient Details  Name: Madison Palmer MRN: [id number] Date of Birth: November 07, 1965 Referring Provider (OT): 2004   Encounter Date: 06/27/2018  OT End of Session - 06/27/18 1652    Visit Number  4    Number of Visits  6    OT Start Time  0120    OT Stop Time  0210    OT Time Calculation (min)  50 min    Activity Tolerance  Patient tolerated treatment well;No increased pain       Past Medical History:  Diagnosis Date  . Anemia   . Anxiety   . Arrhythmia    tachycardia  . Arthritis   . Chickenpox   . Depression   . Diverticulitis   . GERD (gastroesophageal reflux disease)   . Glaucoma   . Hyperlipidemia   . Hyperparathyroidism ([place])   . Hypertension   . Inflammatory polyps of colon ([place])   . LVH (left ventricular hypertrophy)   . Lymphedema   . PCOS (polycystic ovarian syndrome)   . Prediabetes   . Recurrent UTI   . Sleep apnea    CPAP  . Thyroid disease   . Vitamin D deficiency     Past Surgical History:  Procedure Laterality Date  . BREAST BIOPSY  2015  . CESAREAN SECTION  2004  . INNER EAR SURGERY     ear and sinus surgery  . LAPAROSCOPIC REPAIR AND REMOVAL OF GASTRIC BAND    . OOPHORECTOMY Left   . TONSILLECTOMY AND ADENOIDECTOMY      There were no vitals filed for this visit.  Subjective Assessment - 06/27/18 1644    Subjective   Madison Palmer returns to OT today for OT visit 3/4 for  compression garment fitting for remakes.  [place] is [place] [facility] [place]. Pt is not wearing new custom compression  knee highs to clinic for assessment as planned. She reports that the remakes fit well. Pt presents wearing lace up street sdhoes. She reports that she likes them bc they assist with swelling control in feet, but they dont fit  well on left with compression garment in place. Pt requests trouble shooting  after fitting. Last visit we determined that replacement garments and HOS decices were too large and DME vendor had them remade per new specifications.    Pertinent History  morbid obesity, OA, OSA, BLE lymphedema, Hypothytoidism, HTN, Cardiac test results pending    Limitations  difficulty walking, impaired functional mobility and transfers, requires assistive devices to perform personal care and most basic ADLs, impaired instrumental ADLs, limited abiility to work and perform productive activities, decreased social participation and leisure pursuits, impaired body image    Repetition  Increases Symptoms    Currently in Pain?  No/denies    Pain Score  --   denies leg pain today   Pain Onset  More than a month ago                   OT Treatments/Exercises (OP) - 06/27/18 0001      ADLs   ADL Education Given  Yes      Manual Therapy   Manual Therapy  Edema management;Compression Bandaging    Edema Management  remakes   of replacement custom compression garments and HOS devices fitted  today.             OT Education - 06/27/18 1649    Education Details  Reviewed donning HOS devices using assistive devices . Reviewed custom day and night time compression garment wear shedule. Garments she be donned first thing in the morning and be removed in the evening when HOS devices are worn for relaxing and to bed. Pt  verbalized understanding of laundry instruction. Pt instructed to replace garments q 3-6 months and PRN.     Person(s) Educated  Patient    Methods  Explanation;Demonstration    Comprehension  Verbalized understanding;Returned demonstration          OT Long Term Goals - 06/27/18 1653      OT LONG TERM GOAL #1   Title  LE SELF CARE: Pt will ahieve modified independence with donning and doffing custom, knee length compression stockings using assistive devices and extra time at  garment  fitting after skilled LE self-care education.    Baseline  Max A    Time  1    Period  Days    Status  Achieved            Plan - 06/27/18 1654    Clinical Impression Statement  Remade replacement custom compression knee highs are not availabnle for assessment today. Remade replacement HOS devices fitt well and Pt is able to don them with modified independence using assistive device and extra time. Pt is well versed in lymphedema self-management and understands her LE self care home program. She continues to need assistance with doffing garments due to body habitus. She has not resumed use of compression pump. I instructed her today not to resume use of this device until clearing it with her doctors, cardiologist and nephrologist, due to risk of fluid overload returned to heart.  Vibha has met OT goals for this episode of lymphedema care. She is discharged from OT today. She remains a good candidate for clinical support and follow along care. I'm happy to assist her in the future as needed. Pt agrees with plan tyo DC OT today.    Occupational Profile and client history currently impacting functional performance  morbid obesity and arthritis pain also severly  limit mobility and ambulation    Occupational performance deficits (Please refer to evaluation for details):  IADL's;Leisure;Social Participation;Work;ADL's;Other;Education;Rest and Sleep   functional ambnulation and mobility, body image, role performance   Rehab Potential  Fair    Current Impairments/barriers affecting progress:  unknown cardiac status- test results and new cardiology referral pending    OT Frequency  Other (comment)   Cont OT for custom garment/ device fitting due to significant limb volume reduction since last seen, and due to consequential poor fit of [employer] devices. Estimate 1-3 additional visits needed  to complete fitting and LE self care training.   OT Treatment/Interventions  Self-care/ADL training;DME and/or AE  instruction;Other (comment);Patient/family education;Therapeutic activities    Clinical Decision Making  Multiple treatment options, significant modification of task necessary    Recommended Other Services  Replace existing custom , knee length,  flat knit, ccl 2 ( 25-35 mmHg) Jobst Elvarex compression stockings with larger stockings to accomodate weight gain.    Consulted and Agree with Plan of Care  Patient       Patient will benefit from skilled therapeutic intervention in order to improve the following deficits and impairments:  Abnormal gait, Decreased balance, Decreased mobility, Decreased skin integrity, Difficulty walking, Obesity, Decreased range of motion, Increased  edema, Pain, Other (comment), Decreased knowledge of use of DME, Impaired flexibility, Decreased knowledge of precautions  Visit Diagnosis: Lymphedema, not elsewhere classified    Problem List Patient Active Problem List   Diagnosis Date Noted  . Pulmonary vascular congestion 06/24/2018  . Chronic diastolic heart failure ([place]) 05/15/2018  . Dyspnea 05/15/2018  . Pulmonary nodules 05/11/2018  . Hypertensive urgency 05/11/2018  . Chronic respiratory failure with hypoxia ([place]) 05/10/2018  . 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 DDD (degenerative disc disease), lumbar 03/21/2018  . Primary osteoarthritis of both knees 03/21/2018  . Dysphagia 03/19/2018  . PVC (premature ventricular contraction) 03/12/2018  . LVH (left ventricular hypertrophy) 03/12/2018  . Generalized edema 03/12/2018  . Bilateral carotid artery stenosis 03/12/2018  . Weight gain 01/25/2018  . Hemorrhoids 01/05/2018  . Generalized postprandial abdominal pain 01/05/2018  . Hiatal hernia 01/05/2018  . Esophageal dysmotilities 01/05/2018  . Mixed hyperlipidemia 09/12/2017  . Multinodular goiter 07/27/2017  . Hyperparathyroidism ([place]) 06/13/2017  . Vitamin D deficiency 06/13/2017  . Hx of adenomatous colonic polyps  05/15/2017  . Eustachian tube dysfunction, right 05/15/2017  . Lymphatic edema 05/15/2017  . Hypertension 05/14/2017  . Anemia 05/11/2017  . Arrhythmia 05/11/2017  . Diverticulosis 05/11/2017  . GERD (gastroesophageal reflux disease) 05/11/2017  . Hypothyroidism 05/11/2017  . Lower extremity edema 05/11/2017  . Morbid obesity ([place]) 05/11/2017  . Polycystic ovary syndrome 05/11/2017  . OSA on CPAP 05/11/2017  . Neck pain 05/11/2017  . Allergic rhinitis 05/11/2017  . 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 [place], [place], [address] Phone: [phone]   Fax:  [phone]  Name: AANIYAH STROHM MRN: [id number] Date of Birth: 07-Apr-1966

## 2018-06-27 NOTE — Telephone Encounter (Signed)
Spoke with pt. She is needing a refill on Prednisone. Rx has been sent in. Nothing further was needed. 

## 2018-06-28 ENCOUNTER — Ambulatory Visit: Payer: BLUE CROSS/BLUE SHIELD | Admitting: Physical Therapy

## 2018-06-28 ENCOUNTER — Encounter: Payer: Self-pay | Admitting: Physical Therapy

## 2018-06-28 DIAGNOSIS — I890 Lymphedema, not elsewhere classified: Secondary | ICD-10-CM

## 2018-06-28 DIAGNOSIS — R262 Difficulty in walking, not elsewhere classified: Secondary | ICD-10-CM

## 2018-06-28 DIAGNOSIS — M25561 Pain in right knee: Secondary | ICD-10-CM

## 2018-06-28 DIAGNOSIS — M25562 Pain in left knee: Secondary | ICD-10-CM

## 2018-06-28 DIAGNOSIS — G8929 Other chronic pain: Secondary | ICD-10-CM

## 2018-06-28 DIAGNOSIS — M542 Cervicalgia: Secondary | ICD-10-CM

## 2018-06-28 NOTE — Therapy (Signed)
[place], [place], [address] Phone: [phone]   Fax:  [phone]  Physical Therapy Treatment  Patient Details  Name: Madison Palmer MRN: [id number] Date of Birth: [birth year] Referring Provider (PT): Mardelle Matte   Encounter Date: 06/28/2018  PT End of Session - 06/28/18 1021    Visit Number  13    Date for PT Re-Evaluation  07/05/18    PT Start Time  0937    PT Stop Time  1031    PT Time Calculation (min)  54 min    Activity Tolerance  Patient tolerated treatment well    Behavior During Therapy  [facility] for tasks assessed/performed       Past Medical History:  Diagnosis Date  . Anemia   . Anxiety   . Arrhythmia    tachycardia  . Arthritis   . Chickenpox   . Depression   . Diverticulitis   . GERD (gastroesophageal reflux disease)   . Glaucoma   . Hyperlipidemia   . Hyperparathyroidism ([place])   . Hypertension   . Inflammatory polyps of colon ([place])   . LVH (left ventricular hypertrophy)   . Lymphedema   . PCOS (polycystic ovarian syndrome)   . Prediabetes   . Recurrent UTI   . Sleep apnea    CPAP  . Thyroid disease   . Vitamin D deficiency     Past Surgical History:  Procedure Laterality Date  . BREAST BIOPSY  2015  . CESAREAN SECTION  2004  . INNER EAR SURGERY     ear and sinus surgery  . LAPAROSCOPIC REPAIR AND REMOVAL OF GASTRIC BAND    . OOPHORECTOMY Left   . TONSILLECTOMY AND ADENOIDECTOMY      There were no vitals filed for this visit.  Subjective Assessment - 06/28/18 0940    Subjective  "My knee really hurt" R knee started flaring a little walking in here. Shoulders in between her shoulder blades has been painful.     Currently in Pain?  Yes    Pain Score  7     Pain Location  --   knee, shoulder                      [facility] Adult PT Treatment/Exercise - 06/28/18 0001      Lumbar Exercises: Aerobic   UBE (Upper Arm Bike)  L1 3 min each       Lumbar Exercises: Standing   Other Standing Lumbar Exercises  OHP yellow ball 2x10     Other Standing Lumbar Exercises  overhead ext yellow 2x10       Lumbar Exercises: Seated   Long Arc Quad on Chair  Both;Weights;2 sets;10 reps    LAQ on Chair Weights (lbs)  3    Other Seated Lumbar Exercises  Squat to chair and ro blue tband 2x15      Other Seated Lumbar Exercises  blue Tband HS curls x20 each      Moist Heat Therapy   Number Minutes Moist Heat  15 Minutes    Moist Heat Location  Knee      Electrical Stimulation   Electrical Stimulation Location  scapula    Electrical Stimulation Action  premod     Electrical Stimulation Parameters  sitting    Electrical Stimulation Goals  Pain               PT Short Term Goals - 05/20/18 1429  PT SHORT TERM GOAL #1   Title  independent with initial HEP    Status  Achieved        PT Long Term Goals - 06/24/18 0901      PT LONG TERM GOAL #4   Title  report able to do her normal housework without increase pain    Status  Partially Met            Plan - 06/28/18 1022    Clinical Impression Statement  Pt ~ 7 minutes late, she enters reporting increase shoulder and knee pain, but was wanting to push through. She tolerated multi joint exercises well. Report some LBP with overhead extensions. Slight increase in knee pain with LAQ. SATs does tend to drop with functional activity.    Rehab Potential  Good    PT Frequency  2x / week    PT Duration  8 weeks    PT Treatment/Interventions  ADLs/Self Care Home Management;Cryotherapy;Electrical Stimulation;Moist Heat;Functional mobility training;Therapeutic activities;Therapeutic exercise;Balance training;Neuromuscular re-education;Manual techniques;Patient/family education;Dry needling    PT Next Visit Plan  exercises as tolerated, try some gait trials.        Patient will benefit from skilled therapeutic intervention in order to improve the following deficits and impairments:   Abnormal gait, Decreased range of motion, Difficulty walking, Increased muscle spasms, Pain, Impaired flexibility, Improper body mechanics, Decreased strength, Decreased mobility, Obesity  Visit Diagnosis: Lymphedema, not elsewhere classified  Chronic pain of left knee  Chronic pain of right knee  Difficulty in walking, not elsewhere classified  Cervicalgia     Problem List Patient Active Problem List   Diagnosis Date Noted  . Pulmonary vascular congestion 06/24/2018  . Chronic diastolic heart failure ([place]) 05/15/2018  . Dyspnea 05/15/2018  . Pulmonary nodules 05/11/2018  . Hypertensive urgency 05/11/2018  . Chronic respiratory failure with hypoxia ([place]) 05/10/2018  . 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 DDD (degenerative disc disease), lumbar 03/21/2018  . Primary osteoarthritis of both knees 03/21/2018  . Dysphagia 03/19/2018  . PVC (premature ventricular contraction) 03/12/2018  . LVH (left ventricular hypertrophy) 03/12/2018  . Generalized edema 03/12/2018  . Bilateral carotid artery stenosis 03/12/2018  . Weight gain 01/25/2018  . Hemorrhoids 01/05/2018  . Generalized postprandial abdominal pain 01/05/2018  . Hiatal hernia 01/05/2018  . Esophageal dysmotilities 01/05/2018  . Mixed hyperlipidemia 09/12/2017  . Multinodular goiter 07/27/2017  . Hyperparathyroidism ([place]) 06/13/2017  . Vitamin D deficiency 06/13/2017  . Hx of adenomatous colonic polyps 05/15/2017  . Eustachian tube dysfunction, right 05/15/2017  . Lymphatic edema 05/15/2017  . Hypertension 05/14/2017  . Anemia 05/11/2017  . Arrhythmia 05/11/2017  . Diverticulosis 05/11/2017  . GERD (gastroesophageal reflux disease) 05/11/2017  . Hypothyroidism 05/11/2017  . Lower extremity edema 05/11/2017  . Morbid obesity ([place]) 05/11/2017  . Polycystic ovary syndrome 05/11/2017  . OSA on CPAP 05/11/2017  . Neck pain 05/11/2017  . Allergic rhinitis 05/11/2017  . Ossification of  posterior longitudinal ligament ([place]) 08/28/2012    Scot Jun, PTA 06/28/2018, 10:25 AM  [place] [address] [place], [place], [address] Phone: [phone]   Fax:  [phone]  Name: AUDRYANNA ZURITA MRN: [id number] Date of Birth: 09/06/1965

## 2018-07-01 ENCOUNTER — Encounter (INDEPENDENT_AMBULATORY_CARE_PROVIDER_SITE_OTHER): Payer: Self-pay | Admitting: Family Medicine

## 2018-07-01 ENCOUNTER — Ambulatory Visit (INDEPENDENT_AMBULATORY_CARE_PROVIDER_SITE_OTHER): Payer: BLUE CROSS/BLUE SHIELD | Admitting: Family Medicine

## 2018-07-01 VITALS — BP 178/90 | HR 64 | Temp 98.1°F | Ht 62.0 in | Wt 330.0 lb

## 2018-07-01 DIAGNOSIS — Z6841 Body Mass Index (BMI) 40.0 and over, adult: Secondary | ICD-10-CM

## 2018-07-01 DIAGNOSIS — I10 Essential (primary) hypertension: Secondary | ICD-10-CM | POA: Diagnosis not present

## 2018-07-01 DIAGNOSIS — E559 Vitamin D deficiency, unspecified: Secondary | ICD-10-CM

## 2018-07-01 NOTE — Progress Notes (Signed)
Office: [phone]  /  Fax: [phone]   HPI:   Chief Complaint: OBESITY Madison Palmer is here to discuss her progress with her obesity treatment plan. She is on the Category 3 plan and is following her eating plan approximately 90 % of the time. She states she is doing physical therapy for 45 minutes 2 times per week. Madison Palmer is feeling stronger and has had quite a bit of medical visits in the past 2 weeks. She has made a conscious effort to follow the plan more. She has had increase in intensity and as with this increase she has felt more hunger.  Her weight is (!) 330 lb (149.7 kg) today and has had a weight loss of 11 pounds over a period of 2 to 3 weeks since her last visit. She has lost 27 lbs since starting treatment with [place].  Hypertension Madison Palmer is a 53 y.o. female with hypertension. Madison Palmer's blood pressure is uncontrolled, but better than previously. She has had a recent increase in lisinopril and Lasix. She sees Dr. Percival Spanish of Cardiology. She denies chest pain. She is working on weight loss to help control her blood pressure with the goal of decreasing her risk of heart attack and stroke.   Vitamin D Deficiency Madison Palmer has a diagnosis of vitamin D deficiency. She is currently taking prescription Vit D. She notes fatigue and denies nausea, vomiting or muscle weakness.  ASSESSMENT AND PLAN:  Essential hypertension  Vitamin D deficiency  Class 3 severe obesity with serious comorbidity and body mass index (BMI) of 60.0 to 69.9 in adult, unspecified obesity type ([place])  PLAN:  Hypertension We discussed sodium restriction, working on healthy weight loss, and a regular exercise program as the means to achieve improved blood pressure control. Madison Palmer agreed with this plan and agreed to follow up as directed. We will continue to monitor her blood pressure as well as her progress with the above lifestyle modifications. Madison Palmer agrees to continue her current medications and will watch  for signs of hypotension as she continues her lifestyle modifications. She will follow up with Cardiology as previously scheduled appointment. Madison Palmer agrees to follow up with our clinic in 2 weeks.  Vitamin D Deficiency Madison Palmer was informed that low vitamin D levels contributes to fatigue and are associated with obesity, breast, and colon cancer. Madison Palmer agrees to continue taking prescription Vit D @50 ,000 IU every week, no refill needed. She will follow up for routine testing of vitamin D, at least 2-3 times per year. She was informed of the risk of over-replacement of vitamin D and agrees to not increase her dose unless she discusses this with [place] first. Madison Palmer agrees to follow up with our clinic in 2 weeks.  I spent > than 50% of the 15 minute visit on counseling as documented in the note.  Obesity Madison Palmer is currently in the action stage of change. As such, her goal is to continue with weight loss efforts She has agreed to follow the Category 3 plan + 100 calories Madison Palmer has been instructed to work up to a goal of 150 minutes of combined cardio and strengthening exercise per week for weight loss and overall health benefits. We discussed the following Behavioral Modification Strategies today: increasing lean protein intake, increasing vegetables, work on meal planning and easy cooking plans, better snacking choices, and planning for success   Madison Palmer has agreed to follow up with our clinic in 2 weeks. She was informed of the importance of frequent follow up visits  to maximize her success with intensive lifestyle modifications for her multiple health conditions.  ALLERGIES: Allergies  Allergen Reactions  . Fentanyl Hives  . Levofloxacin Hives  . Midazolam Hives  . Pollen Extract     seasonal  . Atorvastatin     Muscle pain in legs   . Rosuvastatin     Abdominal pain    MEDICATIONS: Current Outpatient Medications on File Prior to Visit  Medication Sig Dispense Refill  . acetaminophen  (TYLENOL) 500 MG tablet Take 1,000 mg by mouth 3 (three) times daily as needed for moderate pain or headache.    Marland Kitchen atenolol (TENORMIN) 25 MG tablet Take 1 tablet (25 mg total) by mouth 2 (two) times daily. 180 tablet 3  . benzonatate (TESSALON) 100 MG capsule Take 1 capsule (100 mg total) by mouth 3 (three) times daily as needed for cough. 20 capsule 0  . cetirizine (ZYRTEC ALLERGY) 10 MG tablet Take 1 tablet (10 mg total) by mouth daily. (Patient taking differently: Take 10 mg by mouth as needed for allergies. Use as needed for allergies) 30 tablet 5  . esomeprazole (NEXIUM) 40 MG capsule TAKE ONE CAPSULE BY MOUTH TWICE DAILY 180 capsule 0  . fluticasone (FLONASE) 50 MCG/ACT nasal spray Place 2 sprays into both nostrils daily.    . furosemide (LASIX) 40 MG tablet Take 1 tablet (40 mg total) by mouth 2 (two) times daily. 180 tablet 1  . hydrALAZINE (APRESOLINE) 50 MG tablet Take 1tab in morning and afternoon, and 2tabs at bedtime (Patient taking differently: Take 2tab in morning ,and 2tabs at bedtime) 120 tablet 6  . levothyroxine (SYNTHROID, LEVOTHROID) 75 MCG tablet TK 1 T PO QD  MONDAY-FRIDAY  0  . lisinopril (PRINIVIL,ZESTRIL) 20 MG tablet Take 1 tablet (20 mg total) by mouth daily. 90 tablet 3  . potassium chloride SA (K-DUR,KLOR-CON) 20 MEQ tablet Take 2 tablets (40 mEq total) by mouth daily. 180 tablet 2  . predniSONE (DELTASONE) 5 MG tablet Take 2 tablets (10 mg total) by mouth daily. 60 tablet 2  . Vitamin D, Ergocalciferol, (DRISDOL) 1.25 MG (50000 UT) CAPS capsule TAKE 1 CAPSULE BY MOUTH EVERY 7 DAYS 12 capsule 0  . metFORMIN (GLUCOPHAGE) 500 MG tablet Take 1 tablet (500 mg total) by mouth daily with breakfast. 30 tablet 0   No current facility-administered medications on file prior to visit.     PAST MEDICAL HISTORY: Past Medical History:  Diagnosis Date  . Anemia   . Anxiety   . Arrhythmia    tachycardia  . Arthritis   . Chickenpox   . Depression   . Diverticulitis   . GERD  (gastroesophageal reflux disease)   . Glaucoma   . Hyperlipidemia   . Hyperparathyroidism ([place])   . Hypertension   . Inflammatory polyps of colon ([place])   . LVH (left ventricular hypertrophy)   . Lymphedema   . PCOS (polycystic ovarian syndrome)   . Prediabetes   . Recurrent UTI   . Sleep apnea    CPAP  . Thyroid disease   . Vitamin D deficiency     PAST SURGICAL HISTORY: Past Surgical History:  Procedure Laterality Date  . BREAST BIOPSY  2015  . CESAREAN SECTION  2004  . INNER EAR SURGERY     ear and sinus surgery  . LAPAROSCOPIC REPAIR AND REMOVAL OF GASTRIC BAND    . OOPHORECTOMY Left   . TONSILLECTOMY AND ADENOIDECTOMY      SOCIAL HISTORY: Social History  Tobacco Use  . Smoking status: Never Smoker  . Smokeless tobacco: Never Used  Substance Use Topics  . Alcohol use: Yes    Comment: social  . Drug use: No    FAMILY HISTORY: Family History  Adopted: Yes  Problem Relation Age of Onset  . Hearing loss Son        right   . Healthy Son     ROS: Review of Systems  Constitutional: Positive for malaise/fatigue and weight loss.  Cardiovascular: Negative for chest pain.  Gastrointestinal: Negative for nausea and vomiting.  Musculoskeletal:       Negative muscle weakness    PHYSICAL EXAM: Blood pressure (!) 178/90, pulse 64, temperature 98.1 F (36.7 C), temperature source Oral, height 5\' 2"  (1.575 m), weight (!) 330 lb (149.7 kg), last menstrual period 05/02/2017, SpO2 96 %. Body mass index is 60.36 kg/m. Physical Exam Vitals signs reviewed.  Constitutional:      Appearance: Normal appearance. She is obese.  Cardiovascular:     Rate and Rhythm: Normal rate.     Pulses: Normal pulses.  Pulmonary:     Effort: Pulmonary effort is normal.     Breath sounds: Normal breath sounds.  Musculoskeletal: Normal range of motion.  Skin:    General: Skin is warm and dry.  Neurological:     Mental Status: She is alert and oriented to person, place, and time.    Psychiatric:        Mood and Affect: Mood normal.        Behavior: Behavior normal.     RECENT LABS AND TESTS: BMET    Component Value Date/Time   NA 140 06/18/2018 1317   NA 142 02/27/2018 1233   K 4.1 06/18/2018 1317   CL 97 06/18/2018 1317   CO2 37 (H) 06/18/2018 1317   GLUCOSE 125 (H) 06/18/2018 1317   BUN 28 (H) 06/18/2018 1317   BUN 15 02/27/2018 1233   CREATININE 0.91 06/18/2018 1317   CREATININE 0.94 12/21/2017 1640   CALCIUM 10.3 06/18/2018 1317   GFRNONAA >60 05/13/2018 0515   GFRAA >60 05/13/2018 0515   Lab Results  Component Value Date   HGBA1C 6.0 (H) 02/27/2018   HGBA1C 5.7 09/10/2017   HGBA1C 5.9 05/29/2017   Lab Results  Component Value Date   INSULIN 9.6 02/27/2018   CBC    Component Value Date/Time   WBC 6.8 05/13/2018 0515   RBC 5.05 05/13/2018 0515   HGB 12.9 05/13/2018 0515   HCT 43.5 05/13/2018 0515   PLT 253 05/13/2018 0515   MCV 86.1 05/13/2018 0515   MCH 25.5 (L) 05/13/2018 0515   MCHC 29.7 (L) 05/13/2018 0515   RDW 16.8 (H) 05/13/2018 0515   LYMPHSABS 1.0 05/11/2018 0630   MONOABS 0.5 05/11/2018 0630   EOSABS 0.1 05/11/2018 0630   BASOSABS 0.0 05/11/2018 0630   Iron/TIBC/Ferritin/ %Sat    Component Value Date/Time   IRON 44 02/20/2018 1536   IRON 81 05/29/2017   TIBC 347 05/29/2017   IRONPCTSAT 8.8 (L) 02/20/2018 1536   Lipid Panel     Component Value Date/Time   CHOL 196 02/27/2018 1233   TRIG 142 02/27/2018 1233   HDL 46 02/27/2018 1233   CHOLHDL 5 09/10/2017 1156   VLDL 26.6 09/10/2017 1156   LDLCALC 122 (H) 02/27/2018 1233   Hepatic Function Panel     Component Value Date/Time   PROT 6.1 (L) 05/11/2018 0630   PROT 6.9 02/27/2018 1233   ALBUMIN 3.1 (  L) 05/11/2018 0630   ALBUMIN 4.3 02/27/2018 1233   AST 25 05/11/2018 0630   ALT 39 05/11/2018 0630   ALKPHOS 71 05/11/2018 0630   BILITOT 1.6 (H) 05/11/2018 0630   BILITOT 0.9 02/27/2018 1233   BILIDIR 0.3 (H) 05/11/2018 0630   IBILI 1.3 (H) 05/11/2018 0630       Component Value Date/Time   TSH 0.938 05/11/2018 0630   TSH 1.450 02/27/2018 1233   TSH 0.97 12/11/2017 1158   TSH 1.28 05/29/2017      OBESITY BEHAVIORAL INTERVENTION VISIT  Today's visit was # 8   Starting weight: 357 lbs Starting date: 02/27/18 Today's weight : 330 lbs Today's date: 07/01/2018 Total lbs lost to date: 27    07/01/2018  Height 5\' 2"  (1.575 m)  Weight 330 lb (149.7 kg) (A)  BMI (Calculated) 60.34  BLOOD PRESSURE - SYSTOLIC 595  BLOOD PRESSURE - DIASTOLIC 90   Body Fat % 63.8 %     ASK: We discussed the diagnosis of obesity with Madison Palmer today and Madison Palmer agreed to give [place] permission to discuss obesity behavioral modification therapy today.  ASSESS: Madison Palmer has the diagnosis of obesity and her BMI today is 60.34 Madison Palmer is in the action stage of change   ADVISE: Madison Palmer was educated on the multiple health risks of obesity as well as the benefit of weight loss to improve her health. She was advised of the need for long term treatment and the importance of lifestyle modifications to improve her current health and to decrease her risk of future health problems.  AGREE: Multiple dietary modification options and treatment options were discussed and  Madison Palmer agreed to follow the recommendations documented in the above note.  ARRANGE: Madison Palmer was educated on the importance of frequent visits to treat obesity as outlined per CMS and USPSTF guidelines and agreed to schedule her next follow up appointment today.  I, Trixie Dredge, am acting as transcriptionist for Ilene Qua, MD  I have reviewed the above documentation for accuracy and completeness, and I agree with the above. - Ilene Qua, MD

## 2018-07-02 ENCOUNTER — Encounter: Payer: Self-pay | Admitting: Physical Therapy

## 2018-07-02 ENCOUNTER — Ambulatory Visit: Payer: BLUE CROSS/BLUE SHIELD | Admitting: Physical Therapy

## 2018-07-02 ENCOUNTER — Ambulatory Visit: Payer: BLUE CROSS/BLUE SHIELD | Attending: Nurse Practitioner | Admitting: Physical Therapy

## 2018-07-02 DIAGNOSIS — M542 Cervicalgia: Secondary | ICD-10-CM

## 2018-07-02 DIAGNOSIS — R262 Difficulty in walking, not elsewhere classified: Secondary | ICD-10-CM | POA: Diagnosis present

## 2018-07-02 DIAGNOSIS — M545 Low back pain, unspecified: Secondary | ICD-10-CM

## 2018-07-02 DIAGNOSIS — M62830 Muscle spasm of back: Secondary | ICD-10-CM | POA: Diagnosis present

## 2018-07-02 DIAGNOSIS — M5441 Lumbago with sciatica, right side: Secondary | ICD-10-CM | POA: Diagnosis present

## 2018-07-02 DIAGNOSIS — M25561 Pain in right knee: Secondary | ICD-10-CM | POA: Insufficient documentation

## 2018-07-02 DIAGNOSIS — G8929 Other chronic pain: Secondary | ICD-10-CM | POA: Insufficient documentation

## 2018-07-02 NOTE — Therapy (Signed)
[facility] [address] [place], [place], [address] Phone: [phone]   Fax:  [phone]  Physical Therapy Treatment  Patient Details  Name: Madison Palmer MRN: [id number] Date of Birth: [birth year] Referring Provider (PT): Mardelle Matte   Encounter Date: 07/02/2018  PT End of Session - 07/02/18 1601    Visit Number  14    Date for PT Re-Evaluation  07/05/18    PT Start Time  1537    PT Stop Time  1616    PT Time Calculation (min)  39 min    Activity Tolerance  Patient tolerated treatment well    Behavior During Therapy  [facility] for tasks assessed/performed       Past Medical History:  Diagnosis Date  . Anemia   . Anxiety   . Arrhythmia    tachycardia  . Arthritis   . Chickenpox   . Depression   . Diverticulitis   . GERD (gastroesophageal reflux disease)   . Glaucoma   . Hyperlipidemia   . Hyperparathyroidism ([place])   . Hypertension   . Inflammatory polyps of colon ([place])   . LVH (left ventricular hypertrophy)   . Lymphedema   . PCOS (polycystic ovarian syndrome)   . Prediabetes   . Recurrent UTI   . Sleep apnea    CPAP  . Thyroid disease   . Vitamin D deficiency     Past Surgical History:  Procedure Laterality Date  . BREAST BIOPSY  2015  . CESAREAN SECTION  2004  . INNER EAR SURGERY     ear and sinus surgery  . LAPAROSCOPIC REPAIR AND REMOVAL OF GASTRIC BAND    . OOPHORECTOMY Left   . TONSILLECTOMY AND ADENOIDECTOMY      There were no vitals filed for this visit.  Subjective Assessment - 07/02/18 1529    Subjective  "My knee hurts, My R one" "It really hurts to walk" Pt reports that she felt better after therapy this last time than previous time    Currently in Pain?  Yes    Pain Score  8     Pain Location  Knee    Pain Orientation  Right                       [facility] Adult PT Treatment/Exercise - 07/02/18 0001      Lumbar Exercises: Aerobic   UBE (Upper Arm Bike)  L2 3 min  each       Lumbar Exercises: Standing   Other Standing Lumbar Exercises  triceps ext 20lb 2x20    Other Standing Lumbar Exercises  overhead ext yellow 2x10       Lumbar Exercises: Seated   Long Arc Quad on Chair  Both;Weights;2 sets;10 reps    LAQ on Chair Weights (lbs)  3    Other Seated Lumbar Exercises  Squat to chair and row blue tband 2x15      Other Seated Lumbar Exercises  blue Tband HS curls x20 each, seated march 3lb 2x10       Moist Heat Therapy   Number Minutes Moist Heat  15 Minutes    Moist Heat Location  Knee      Electrical Stimulation   Electrical Stimulation Location  upper back    Electrical Stimulation Action  IFC    Electrical Stimulation Parameters  sitting     Electrical Stimulation Goals  Pain  PT Short Term Goals - 05/20/18 1429      PT SHORT TERM GOAL #1   Title  independent with initial HEP    Status  Achieved        PT Long Term Goals - 07/02/18 1603      PT LONG TERM GOAL #1   Title  understand proper body mechanics for ADL's    Status  Achieved      PT LONG TERM GOAL #2   Title  increase lumbar ROM 25%    Status  Achieved      PT LONG TERM GOAL #3   Title  decrease pain 50%    Status  Partially Met      PT LONG TERM GOAL #4   Title  report able to do her normal housework without increase pain    Status  Partially Met      PT LONG TERM GOAL #5   Title  patietn will increase knee AROM to 100 degrees flexion    Status  Achieved            Plan - 07/02/18 1603    Clinical Impression Statement  Pt ~ 12 minutes late for today's treatment. Again reports increase R knee pain but will to proceed. Fatigue note with sit to stand and row. Reported some L calf soreness with HS curls. Good extension with LAQ. Good lumbar extension with overhead extensions.     Rehab Potential  Good    PT Frequency  2x / week    PT Duration  8 weeks    PT Treatment/Interventions  ADLs/Self Care Home Management;Cryotherapy;Electrical  Stimulation;Moist Heat;Functional mobility training;Therapeutic activities;Therapeutic exercise;Balance training;Neuromuscular re-education;Manual techniques;Patient/family education;Dry needling    PT Next Visit Plan  exercises as tolerated, try some gait trials.        Patient will benefit from skilled therapeutic intervention in order to improve the following deficits and impairments:  Abnormal gait, Decreased range of motion, Difficulty walking, Increased muscle spasms, Pain, Impaired flexibility, Improper body mechanics, Decreased strength, Decreased mobility, Obesity  Visit Diagnosis: Chronic pain of right knee  Difficulty in walking, not elsewhere classified  Cervicalgia  Acute bilateral low back pain without sciatica  Acute right-sided low back pain with right-sided sciatica  Muscle spasm of back     Problem List Patient Active Problem List   Diagnosis Date Noted  . Pulmonary vascular congestion 06/24/2018  . Chronic diastolic heart failure ([place]) 05/15/2018  . Dyspnea 05/15/2018  . Pulmonary nodules 05/11/2018  . Hypertensive urgency 05/11/2018  . Chronic respiratory failure with hypoxia ([place]) 05/10/2018  . 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 DDD (degenerative disc disease), lumbar 03/21/2018  . Primary osteoarthritis of both knees 03/21/2018  . Dysphagia 03/19/2018  . PVC (premature ventricular contraction) 03/12/2018  . LVH (left ventricular hypertrophy) 03/12/2018  . Generalized edema 03/12/2018  . Bilateral carotid artery stenosis 03/12/2018  . Weight gain 01/25/2018  . Hemorrhoids 01/05/2018  . Generalized postprandial abdominal pain 01/05/2018  . Hiatal hernia 01/05/2018  . Esophageal dysmotilities 01/05/2018  . Mixed hyperlipidemia 09/12/2017  . Multinodular goiter 07/27/2017  . Hyperparathyroidism ([place]) 06/13/2017  . Vitamin D deficiency 06/13/2017  . Hx of adenomatous colonic polyps 05/15/2017  . Eustachian tube  dysfunction, right 05/15/2017  . Lymphatic edema 05/15/2017  . Hypertension 05/14/2017  . Anemia 05/11/2017  . Arrhythmia 05/11/2017  . Diverticulosis 05/11/2017  . GERD (gastroesophageal reflux disease) 05/11/2017  . Hypothyroidism 05/11/2017  . Lower extremity edema 05/11/2017  .  Morbid obesity ([place]) 05/11/2017  . Polycystic ovary syndrome 05/11/2017  . OSA on CPAP 05/11/2017  . Neck pain 05/11/2017  . Allergic rhinitis 05/11/2017  . 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

## 2018-07-03 ENCOUNTER — Telehealth: Payer: Self-pay | Admitting: Pulmonary Disease

## 2018-07-03 NOTE — Telephone Encounter (Signed)
[place], [place], [phone].  Nisa requested lung cancer history for the last 15 years. Information given.  Nothing further at this time.

## 2018-07-04 ENCOUNTER — Encounter: Payer: Self-pay | Admitting: Physical Therapy

## 2018-07-04 ENCOUNTER — Ambulatory Visit: Payer: BLUE CROSS/BLUE SHIELD | Admitting: Physical Therapy

## 2018-07-04 DIAGNOSIS — M62830 Muscle spasm of back: Secondary | ICD-10-CM

## 2018-07-04 DIAGNOSIS — M545 Low back pain, unspecified: Secondary | ICD-10-CM

## 2018-07-04 DIAGNOSIS — M542 Cervicalgia: Secondary | ICD-10-CM

## 2018-07-04 DIAGNOSIS — M25561 Pain in right knee: Secondary | ICD-10-CM | POA: Diagnosis not present

## 2018-07-04 DIAGNOSIS — R262 Difficulty in walking, not elsewhere classified: Secondary | ICD-10-CM

## 2018-07-04 DIAGNOSIS — M5441 Lumbago with sciatica, right side: Secondary | ICD-10-CM

## 2018-07-04 DIAGNOSIS — G8929 Other chronic pain: Secondary | ICD-10-CM

## 2018-07-04 NOTE — Therapy (Signed)
[place], [place], [address] Phone: [phone]   Fax:  [phone]  Physical Therapy Treatment  Patient Details  Name: Madison Palmer MRN: [id number] Date of Birth: 1966-01-23 Referring Provider (PT): Mardelle Matte   Encounter Date: 07/04/2018  PT End of Session - 07/04/18 1230    Visit Number  15    Date for PT Re-Evaluation  07/05/18    PT Start Time  1150    PT Stop Time  1241    PT Time Calculation (min)  51 min    Activity Tolerance  Patient tolerated treatment well    Behavior During Therapy  [facility] for tasks assessed/performed       Past Medical History:  Diagnosis Date  . Anemia   . Anxiety   . Arrhythmia    tachycardia  . Arthritis   . Chickenpox   . Depression   . Diverticulitis   . GERD (gastroesophageal reflux disease)   . Glaucoma   . Hyperlipidemia   . Hyperparathyroidism ([place])   . Hypertension   . Inflammatory polyps of colon ([place])   . LVH (left ventricular hypertrophy)   . Lymphedema   . PCOS (polycystic ovarian syndrome)   . Prediabetes   . Recurrent UTI   . Sleep apnea    CPAP  . Thyroid disease   . Vitamin D deficiency     Past Surgical History:  Procedure Laterality Date  . BREAST BIOPSY  2015  . CESAREAN SECTION  2004  . INNER EAR SURGERY     ear and sinus surgery  . LAPAROSCOPIC REPAIR AND REMOVAL OF GASTRIC BAND    . OOPHORECTOMY Left   . TONSILLECTOMY AND ADENOIDECTOMY      There were no vitals filed for this visit.  Subjective Assessment - 07/04/18 1159    Subjective  "Yesterday was a great day, I walked around, Didn't wear oxygen most of the day". Last nigh felt like her back was trying to spasm but it past. Got up to walk to the car and had some R knee pain    Currently in Pain?  Yes    Pain Score  8     Pain Orientation  Right                       [facility] Adult PT Treatment/Exercise - 07/04/18 0001      Lumbar Exercises:  Aerobic   UBE (Upper Arm Bike)  L2 3 min each       Lumbar Exercises: Standing   Shoulder Extension  Strengthening;20 reps;Theraband    Theraband Level (Shoulder Extension)  Level 4 (Blue)    Other Standing Lumbar Exercises  Sit to stand with OHP yellow ball 2x15       Lumbar Exercises: Seated   Long Arc Quad on Chair  Both;Weights;2 sets;10 reps    LAQ on Chair Weights (lbs)  3    Other Seated Lumbar Exercises  blue Tband HS curls 2x15 each, seated march 3lb 2x10       Moist Heat Therapy   Number Minutes Moist Heat  15 Minutes    Moist Heat Location  Knee      Electrical Stimulation   Electrical Stimulation Location  upper back    Electrical Stimulation Action  IIFC    Electrical Stimulation Parameters  sitting    Electrical Stimulation Goals  Pain  PT Short Term Goals - 05/20/18 1429      PT SHORT TERM GOAL #1   Title  independent with initial HEP    Status  Achieved        PT Long Term Goals - 07/04/18 1232      PT LONG TERM GOAL #1   Title  understand proper body mechanics for ADL's    Status  Achieved      PT LONG TERM GOAL #2   Title  increase lumbar ROM 25%    Status  Achieved      PT LONG TERM GOAL #3   Title  decrease pain 50%    Status  Partially Met      PT LONG TERM GOAL #4   Title  report able to do her normal housework without increase pain    Status  Partially Met      PT LONG TERM GOAL #5   Title  patietn will increase knee AROM to 100 degrees flexion    Status  Achieved            Plan - 07/04/18 1234    Clinical Impression Statement  Pt ~ 5 minutes late today. She has shown a progression with her functional mobility despite R knee pain. She is now ambulating into and out of clinic were she previously needed to be pushed in. She has progressed to multi joint exercises with increase intensities. She give good effort with all exercises again despite knee pain. Her shoulder do occasionally get tight.     Rehab  Potential  Good    PT Frequency  2x / week    PT Duration  8 weeks    PT Treatment/Interventions  ADLs/Self Care Home Management;Cryotherapy;Electrical Stimulation;Moist Heat;Functional mobility training;Therapeutic activities;Therapeutic exercise;Balance training;Neuromuscular re-education;Manual techniques;Patient/family education;Dry needling    PT Next Visit Plan  Multi joint interventions, progress with functional strength and endurance.       Patient will benefit from skilled therapeutic intervention in order to improve the following deficits and impairments:  Abnormal gait, Decreased range of motion, Difficulty walking, Increased muscle spasms, Pain, Impaired flexibility, Improper body mechanics, Decreased strength, Decreased mobility, Obesity  Visit Diagnosis: Chronic pain of right knee  Difficulty in walking, not elsewhere classified  Cervicalgia  Acute bilateral low back pain without sciatica  Acute right-sided low back pain with right-sided sciatica  Muscle spasm of back     Problem List Patient Active Problem List   Diagnosis Date Noted  . Pulmonary vascular congestion 06/24/2018  . Chronic diastolic heart failure ([place]) 05/15/2018  . Dyspnea 05/15/2018  . Pulmonary nodules 05/11/2018  . Hypertensive urgency 05/11/2018  . Chronic respiratory failure with hypoxia ([place]) 05/10/2018  . 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 DDD (degenerative disc disease), lumbar 03/21/2018  . Primary osteoarthritis of both knees 03/21/2018  . Dysphagia 03/19/2018  . PVC (premature ventricular contraction) 03/12/2018  . LVH (left ventricular hypertrophy) 03/12/2018  . Generalized edema 03/12/2018  . Bilateral carotid artery stenosis 03/12/2018  . Weight gain 01/25/2018  . Hemorrhoids 01/05/2018  . Generalized postprandial abdominal pain 01/05/2018  . Hiatal hernia 01/05/2018  . Esophageal dysmotilities 01/05/2018  . Mixed hyperlipidemia 09/12/2017  .  Multinodular goiter 07/27/2017  . Hyperparathyroidism ([place]) 06/13/2017  . Vitamin D deficiency 06/13/2017  . Hx of adenomatous colonic polyps 05/15/2017  . Eustachian tube dysfunction, right 05/15/2017  . Lymphatic edema 05/15/2017  . Hypertension 05/14/2017  . Anemia 05/11/2017  . Arrhythmia 05/11/2017  .  Diverticulosis 05/11/2017  . GERD (gastroesophageal reflux disease) 05/11/2017  . Hypothyroidism 05/11/2017  . Lower extremity edema 05/11/2017  . Morbid obesity ([place]) 05/11/2017  . Polycystic ovary syndrome 05/11/2017  . OSA on CPAP 05/11/2017  . Neck pain 05/11/2017  . Allergic rhinitis 05/11/2017  . 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

## 2018-07-05 ENCOUNTER — Telehealth: Payer: Self-pay | Admitting: Pulmonary Disease

## 2018-07-05 NOTE — Telephone Encounter (Signed)
Blood test for nodule showed very low risk of malignancy less than 1% So do not think that biopsy is necessary

## 2018-07-07 ENCOUNTER — Other Ambulatory Visit: Payer: Self-pay | Admitting: Gastroenterology

## 2018-07-08 ENCOUNTER — Ambulatory Visit (HOSPITAL_COMMUNITY)
Admission: RE | Admit: 2018-07-08 | Discharge: 2018-07-08 | Disposition: A | Discharge status: Home / Self Care | Payer: BLUE CROSS/BLUE SHIELD | Source: Ambulatory Visit | Attending: Cardiovascular Disease | Admitting: Cardiovascular Disease

## 2018-07-08 DIAGNOSIS — I6523 Occlusion and stenosis of bilateral carotid arteries: Secondary | ICD-10-CM | POA: Diagnosis present

## 2018-07-08 NOTE — Telephone Encounter (Signed)
Spoke with patient. She is aware of results. Verbalized understanding. Nothing further needed at time of call.

## 2018-07-09 ENCOUNTER — Ambulatory Visit: Payer: BLUE CROSS/BLUE SHIELD | Admitting: Cardiology

## 2018-07-09 ENCOUNTER — Encounter: Payer: Self-pay | Admitting: Physical Therapy

## 2018-07-09 ENCOUNTER — Ambulatory Visit: Payer: BLUE CROSS/BLUE SHIELD | Admitting: Physical Therapy

## 2018-07-09 DIAGNOSIS — G8929 Other chronic pain: Secondary | ICD-10-CM

## 2018-07-09 DIAGNOSIS — M542 Cervicalgia: Secondary | ICD-10-CM

## 2018-07-09 DIAGNOSIS — M25561 Pain in right knee: Secondary | ICD-10-CM | POA: Diagnosis not present

## 2018-07-09 DIAGNOSIS — R262 Difficulty in walking, not elsewhere classified: Secondary | ICD-10-CM

## 2018-07-09 LAB — BASIC METABOLIC PANEL
BUN/Creatinine Ratio: 34 — ABNORMAL HIGH (ref 9–23)
BUN: 30 mg/dL — ABNORMAL HIGH (ref 6–24)
CHLORIDE: 98 mmol/L (ref 96–106)
CO2: 27 mmol/L (ref 20–29)
Calcium: 10.5 mg/dL — ABNORMAL HIGH (ref 8.7–10.2)
Creatinine, Ser: 0.88 mg/dL (ref 0.57–1.00)
GFR calc Af Amer: 87 mL/min/{1.73_m2} (ref >59)
GFR calc non Af Amer: 76 mL/min/{1.73_m2} (ref >59)
Glucose: 103 mg/dL — ABNORMAL HIGH (ref 65–99)
Potassium: 3.9 mmol/L (ref 3.5–5.2)
Sodium: 143 mmol/L (ref 134–144)

## 2018-07-09 NOTE — Therapy (Signed)
[address] [place], [place], [address] Phone: [phone]   Fax:  [phone]  Physical Therapy Treatment  Patient Details  Name: Madison Palmer MRN: [id number] Date of Birth: 10-12-1965 Referring Provider (PT): Mardelle Matte   Encounter Date: 07/09/2018  PT End of Session - 07/09/18 1615    PT Start Time  1525    PT Stop Time  1636    PT Time Calculation (min)  71 min       Past Medical History:  Diagnosis Date  . Anemia   . Anxiety   . Arrhythmia    tachycardia  . Arthritis   . Chickenpox   . Depression   . Diverticulitis   . GERD (gastroesophageal reflux disease)   . Glaucoma   . Hyperlipidemia   . Hyperparathyroidism ([place])   . Hypertension   . Inflammatory polyps of colon ([place])   . LVH (left ventricular hypertrophy)   . Lymphedema   . PCOS (polycystic ovarian syndrome)   . Prediabetes   . Recurrent UTI   . Sleep apnea    CPAP  . Thyroid disease   . Vitamin D deficiency     Past Surgical History:  Procedure Laterality Date  . BREAST BIOPSY  2015  . CESAREAN SECTION  2004  . INNER EAR SURGERY     ear and sinus surgery  . LAPAROSCOPIC REPAIR AND REMOVAL OF GASTRIC BAND    . OOPHORECTOMY Left   . TONSILLECTOMY AND ADENOIDECTOMY      There were no vitals filed for this visit.  Subjective Assessment - 07/09/18 1530    Subjective  Knee pain and stiffness after last visit. Some stiffness in the HS. Pt reports that she went to [employer] on Friday and loaded her car herself. Pt reports that she felt it in her back from loading groceries. Anterior shaft cramping Saturday morning.  Sunday knees were batter but shoulder were hurting. Yesterday was a good day walked around everywhere without cane or oxygen    Currently in Pain?  Yes    Pain Score  6     Pain Location  Knee    Pain Orientation  Right;Left                       [facility] Adult PT Treatment/Exercise - 07/09/18 0001      Lumbar Exercises: Aerobic   UBE (Upper Arm Bike)  L2 2 min each     Nustep  L2 x6 min       Lumbar Exercises: Standing   Shoulder Extension  Strengthening;Theraband;15 reps;Both   x2   Theraband Level (Shoulder Extension)  Level 3 (Green)    Other Standing Lumbar Exercises  Sit to stang holding 5lb dumbbell 2x15    Other Standing Lumbar Exercises  overhead ext yellow 2x10       Lumbar Exercises: Seated   Long Arc Quad on Chair  Both;Weights;2 sets;10 reps    LAQ on Chair Weights (lbs)  3    Other Seated Lumbar Exercises  blue Tband HS curls 2x15 each, seated march 3lb 2x10       Moist Heat Therapy   Number Minutes Moist Heat  15 Minutes    Moist Heat Location  Shoulder      Electrical Stimulation   Electrical Stimulation Location  upper back    Electrical Stimulation Action  IFC    Electrical Stimulation Parameters  sitting  Electrical Stimulation Goals  Pain               PT Short Term Goals - 05/20/18 1429      PT SHORT TERM GOAL #1   Title  independent with initial HEP    Status  Achieved        PT Long Term Goals - 07/09/18 1634      PT LONG TERM GOAL #1   Title  understand proper body mechanics for ADL's    Status  Achieved      PT LONG TERM GOAL #2   Title  increase lumbar ROM 25%    Status  Achieved      PT LONG TERM GOAL #3   Title  decrease pain 50%    Status  Partially Met      PT LONG TERM GOAL #4   Title  report able to do her normal housework without increase pain    Status  Partially Met      PT LONG TERM GOAL #5   Title  patietn will increase knee AROM to 100 degrees flexion    Status  Achieved            Plan - 07/09/18 1631    Clinical Impression Statement  Pt ~ 10 minutes late for today's treatment. Added more aerobic warm despite reports of knee pain, pt insisted that she push through. She did well with all seated exercises. Increase reports with sit to stand. Pt did need one restroom break during treatment session.  She reports increase mobility at home and in community. pt return to MD tomorrow.    Rehab Potential  Good    PT Treatment/Interventions  ADLs/Self Care Home Management;Cryotherapy;Electrical Stimulation;Moist Heat;Functional mobility training;Therapeutic activities;Therapeutic exercise;Balance training;Neuromuscular re-education;Manual techniques;Patient/family education;Dry needling    PT Next Visit Plan  Multi joint interventions, progress with functional strength and endurance.       Patient will benefit from skilled therapeutic intervention in order to improve the following deficits and impairments:  Abnormal gait, Decreased range of motion, Difficulty walking, Increased muscle spasms, Pain, Impaired flexibility, Improper body mechanics, Decreased strength, Decreased mobility, Obesity  Visit Diagnosis: Difficulty in walking, not elsewhere classified  Chronic pain of right knee  Cervicalgia     Problem List Patient Active Problem List   Diagnosis Date Noted  . Pulmonary vascular congestion 06/24/2018  . Chronic diastolic heart failure ([place]) 05/15/2018  . Dyspnea 05/15/2018  . Pulmonary nodules 05/11/2018  . Hypertensive urgency 05/11/2018  . Chronic respiratory failure with hypoxia ([place]) 05/10/2018  . 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 DDD (degenerative disc disease), lumbar 03/21/2018  . Primary osteoarthritis of both knees 03/21/2018  . Dysphagia 03/19/2018  . PVC (premature ventricular contraction) 03/12/2018  . LVH (left ventricular hypertrophy) 03/12/2018  . Generalized edema 03/12/2018  . Bilateral carotid artery stenosis 03/12/2018  . Weight gain 01/25/2018  . Hemorrhoids 01/05/2018  . Generalized postprandial abdominal pain 01/05/2018  . Hiatal hernia 01/05/2018  . Esophageal dysmotilities 01/05/2018  . Mixed hyperlipidemia 09/12/2017  . Multinodular goiter 07/27/2017  . Hyperparathyroidism ([place]) 06/13/2017  . Vitamin D deficiency  06/13/2017  . Hx of adenomatous colonic polyps 05/15/2017  . Eustachian tube dysfunction, right 05/15/2017  . Lymphatic edema 05/15/2017  . Hypertension 05/14/2017  . Anemia 05/11/2017  . Arrhythmia 05/11/2017  . Diverticulosis 05/11/2017  . GERD (gastroesophageal reflux disease) 05/11/2017  . Hypothyroidism 05/11/2017  . Lower extremity edema 05/11/2017  . Morbid obesity ([place])  05/11/2017  . Polycystic ovary syndrome 05/11/2017  . OSA on CPAP 05/11/2017  . Neck pain 05/11/2017  . Allergic rhinitis 05/11/2017  . 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

## 2018-07-10 NOTE — Telephone Encounter (Signed)
Pt is scheduled for appt 4.16.20 with Dr. Rush Landmark and requested a refill on Nexium.

## 2018-07-11 ENCOUNTER — Ambulatory Visit: Payer: BLUE CROSS/BLUE SHIELD | Admitting: Physical Therapy

## 2018-07-11 ENCOUNTER — Other Ambulatory Visit: Payer: Self-pay

## 2018-07-11 ENCOUNTER — Encounter: Payer: Self-pay | Admitting: Physical Therapy

## 2018-07-11 DIAGNOSIS — M25561 Pain in right knee: Secondary | ICD-10-CM | POA: Diagnosis not present

## 2018-07-11 DIAGNOSIS — R262 Difficulty in walking, not elsewhere classified: Secondary | ICD-10-CM

## 2018-07-11 DIAGNOSIS — M542 Cervicalgia: Secondary | ICD-10-CM

## 2018-07-11 DIAGNOSIS — G8929 Other chronic pain: Secondary | ICD-10-CM

## 2018-07-11 NOTE — Therapy (Signed)
[place], [place], [address] Phone: [phone]   Fax:  [phone]  Physical Therapy Treatment  Patient Details  Name: Madison Palmer MRN: [id number] Date of Birth: Nov 14, 1965 Referring Provider (PT): Mardelle Matte   Encounter Date: 07/11/2018  PT End of Session - 07/11/18 1559    Visit Number  16    Date for PT Re-Evaluation  08/09/18    PT Start Time  1525    PT Stop Time  1613    PT Time Calculation (min)  48 min    Activity Tolerance  Patient tolerated treatment well    Behavior During Therapy  [facility] for tasks assessed/performed       Past Medical History:  Diagnosis Date  . Anemia   . Anxiety   . Arrhythmia    tachycardia  . Arthritis   . Chickenpox   . Depression   . Diverticulitis   . GERD (gastroesophageal reflux disease)   . Glaucoma   . Hyperlipidemia   . Hyperparathyroidism ([place])   . Hypertension   . Inflammatory polyps of colon ([place])   . LVH (left ventricular hypertrophy)   . Lymphedema   . PCOS (polycystic ovarian syndrome)   . Prediabetes   . Recurrent UTI   . Sleep apnea    CPAP  . Thyroid disease   . Vitamin D deficiency     Past Surgical History:  Procedure Laterality Date  . BREAST BIOPSY  2015  . CESAREAN SECTION  2004  . INNER EAR SURGERY     ear and sinus surgery  . LAPAROSCOPIC REPAIR AND REMOVAL OF GASTRIC BAND    . OOPHORECTOMY Left   . TONSILLECTOMY AND ADENOIDECTOMY      There were no vitals filed for this visit.  Subjective Assessment - 07/11/18 1531    Subjective  "I hurt"     Currently in Pain?  Yes    Pain Score  9     Pain Orientation  Left;Right                       [facility] Adult PT Treatment/Exercise - 07/11/18 0001      Lumbar Exercises: Aerobic   Nustep  L4 x6 min       Lumbar Exercises: Standing   Other Standing Lumbar Exercises  Standing march x10 SS with 3 sit to stands x2       Lumbar Exercises: Seated   Sit to Stand  10 reps   x2   Other Seated Lumbar Exercises  Rows blue Tband     Other Seated Lumbar Exercises  blue Tband HS curls 2x15 each, Seated trunk rotations SS OHP yellow ball 2x10                PT Short Term Goals - 05/20/18 1429      PT SHORT TERM GOAL #1   Title  independent with initial HEP    Status  Achieved        PT Long Term Goals - 07/09/18 1634      PT LONG TERM GOAL #1   Title  understand proper body mechanics for ADL's    Status  Achieved      PT LONG TERM GOAL #2   Title  increase lumbar ROM 25%    Status  Achieved      PT LONG TERM GOAL #3   Title  decrease pain 50%  Status  Partially Met      PT LONG TERM GOAL #4   Title  report able to do her normal housework without increase pain    Status  Partially Met      PT LONG TERM GOAL #5   Title  patietn will increase knee AROM to 100 degrees flexion    Status  Achieved            Plan - 07/11/18 1600    Clinical Impression Statement  Pt ~ 11 minutes late for today's treatment session she entered into today's session reporting increase knee pain. Most interventions performed in seated position. Progressed to superset interventions with functional activities like sit to stands with marching. She did fatigue well with today's exercises.     Rehab Potential  Good    PT Frequency  2x / week    PT Duration  8 weeks    PT Treatment/Interventions  ADLs/Self Care Home Management;Cryotherapy;Electrical Stimulation;Moist Heat;Functional mobility training;Therapeutic activities;Therapeutic exercise;Balance training;Neuromuscular re-education;Manual techniques;Patient/family education;Dry needling    PT Next Visit Plan  Multi joint interventions, progress with functional strenght and endurance.       Patient will benefit from skilled therapeutic intervention in order to improve the following deficits and impairments:  Abnormal gait, Decreased range of motion, Difficulty walking, Increased muscle  spasms, Pain, Impaired flexibility, Improper body mechanics, Decreased strength, Decreased mobility, Obesity  Visit Diagnosis: Difficulty in walking, not elsewhere classified  Chronic pain of right knee  Cervicalgia     Problem List Patient Active Problem List   Diagnosis Date Noted  . Pulmonary vascular congestion 06/24/2018  . Chronic diastolic heart failure ([place]) 05/15/2018  . Dyspnea 05/15/2018  . Pulmonary nodules 05/11/2018  . Hypertensive urgency 05/11/2018  . Chronic respiratory failure with hypoxia ([place]) 05/10/2018  . 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 DDD (degenerative disc disease), lumbar 03/21/2018  . Primary osteoarthritis of both knees 03/21/2018  . Dysphagia 03/19/2018  . PVC (premature ventricular contraction) 03/12/2018  . LVH (left ventricular hypertrophy) 03/12/2018  . Generalized edema 03/12/2018  . Bilateral carotid artery stenosis 03/12/2018  . Weight gain 01/25/2018  . Hemorrhoids 01/05/2018  . Generalized postprandial abdominal pain 01/05/2018  . Hiatal hernia 01/05/2018  . Esophageal dysmotilities 01/05/2018  . Mixed hyperlipidemia 09/12/2017  . Multinodular goiter 07/27/2017  . Hyperparathyroidism ([place]) 06/13/2017  . Vitamin D deficiency 06/13/2017  . Hx of adenomatous colonic polyps 05/15/2017  . Eustachian tube dysfunction, right 05/15/2017  . Lymphatic edema 05/15/2017  . Hypertension 05/14/2017  . Anemia 05/11/2017  . Arrhythmia 05/11/2017  . Diverticulosis 05/11/2017  . GERD (gastroesophageal reflux disease) 05/11/2017  . Hypothyroidism 05/11/2017  . Lower extremity edema 05/11/2017  . Morbid obesity ([place]) 05/11/2017  . Polycystic ovary syndrome 05/11/2017  . OSA on CPAP 05/11/2017  . Neck pain 05/11/2017  . Allergic rhinitis 05/11/2017  . 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

## 2018-07-15 ENCOUNTER — Encounter: Payer: Self-pay | Admitting: Physical Therapy

## 2018-07-15 ENCOUNTER — Ambulatory Visit: Payer: BLUE CROSS/BLUE SHIELD | Admitting: Physical Therapy

## 2018-07-15 ENCOUNTER — Other Ambulatory Visit: Payer: Self-pay

## 2018-07-15 ENCOUNTER — Encounter: Payer: Self-pay | Admitting: Nurse Practitioner

## 2018-07-15 DIAGNOSIS — M25561 Pain in right knee: Secondary | ICD-10-CM

## 2018-07-15 DIAGNOSIS — M542 Cervicalgia: Secondary | ICD-10-CM

## 2018-07-15 DIAGNOSIS — G8929 Other chronic pain: Secondary | ICD-10-CM

## 2018-07-15 DIAGNOSIS — R262 Difficulty in walking, not elsewhere classified: Secondary | ICD-10-CM

## 2018-07-15 NOTE — Therapy (Signed)
[facility] [place], [place], [address] Phone: [phone]   Fax:  [phone]  Physical Therapy Treatment  Patient Details  Name: Madison Palmer MRN: [id number] Date of Birth: 08/15/1965 Referring Provider (PT): Mardelle Matte   Encounter Date: 07/15/2018  PT End of Session - 07/15/18 1559    Visit Number  17    Date for PT Re-Evaluation  08/09/18    PT Start Time  6712    PT Stop Time  1613    PT Time Calculation (min)  38 min    Activity Tolerance  Patient tolerated treatment well    Behavior During Therapy  [facility] for tasks assessed/performed       Past Medical History:  Diagnosis Date  . Anemia   . Anxiety   . Arrhythmia    tachycardia  . Arthritis   . Chickenpox   . Depression   . Diverticulitis   . GERD (gastroesophageal reflux disease)   . Glaucoma   . Hyperlipidemia   . Hyperparathyroidism ([place])   . Hypertension   . Inflammatory polyps of colon ([place])   . LVH (left ventricular hypertrophy)   . Lymphedema   . PCOS (polycystic ovarian syndrome)   . Prediabetes   . Recurrent UTI   . Sleep apnea    CPAP  . Thyroid disease   . Vitamin D deficiency     Past Surgical History:  Procedure Laterality Date  . BREAST BIOPSY  2015  . CESAREAN SECTION  2004  . INNER EAR SURGERY     ear and sinus surgery  . LAPAROSCOPIC REPAIR AND REMOVAL OF GASTRIC BAND    . OOPHORECTOMY Left   . TONSILLECTOMY AND ADENOIDECTOMY      There were no vitals filed for this visit.  Subjective Assessment - 07/15/18 1537    Subjective  "Today is ok"     Currently in Pain?  Yes    Pain Score  5     Pain Location  Knee    Pain Orientation  Right;Left                       [facility] Adult PT Treatment/Exercise - 07/15/18 0001      Lumbar Exercises: Aerobic   Nustep  L4 x6 min       Lumbar Exercises: Standing   Other Standing Lumbar Exercises  overhead ext yellow 2x10       Lumbar Exercises:  Seated   Other Seated Lumbar Exercises  Sit to stand and row 2x12    Other Seated Lumbar Exercises  blue Tband HS curls 2x15 each, Seated trunk rotations SS OHP yellow ball 2x10       Moist Heat Therapy   Number Minutes Moist Heat  15 Minutes    Moist Heat Location  Shoulder      Electrical Stimulation   Electrical Stimulation Location  upper back    Electrical Stimulation Action  IFC    Electrical Stimulation Parameters  sitting    Electrical Stimulation Goals  Pain               PT Short Term Goals - 05/20/18 1429      PT SHORT TERM GOAL #1   Title  independent with initial HEP    Status  Achieved        PT Long Term Goals - 07/09/18 1634      PT LONG TERM GOAL #1  Title  understand proper body mechanics for ADL's    Status  Achieved      PT LONG TERM GOAL #2   Title  increase lumbar ROM 25%    Status  Achieved      PT LONG TERM GOAL #3   Title  decrease pain 50%    Status  Partially Met      PT LONG TERM GOAL #4   Title  report able to do her normal housework without increase pain    Status  Partially Met      PT LONG TERM GOAL #5   Title  patietn will increase knee AROM to 100 degrees flexion    Status  Achieved            Plan - 07/15/18 1600    Clinical Impression Statement  20 minutes late for today's treatment. Sit to stand with row, trunk rotation, and OHP all done in a superset for aerobic functional endurance. She became very fatigue with this. Good strength with HS curls. No reports of increase pain throughout    PT Treatment/Interventions  ADLs/Self Care Home Management;Cryotherapy;Electrical Stimulation;Moist Heat;Functional mobility training;Therapeutic activities;Therapeutic exercise;Balance training;Neuromuscular re-education;Manual techniques;Patient/family education;Dry needling    PT Next Visit Plan  Multi joint interventions, progress with functional strenght and endurance.       Patient will benefit from skilled therapeutic  intervention in order to improve the following deficits and impairments:  Abnormal gait, Decreased range of motion, Difficulty walking, Increased muscle spasms, Pain, Impaired flexibility, Improper body mechanics, Decreased strength, Decreased mobility, Obesity  Visit Diagnosis: Difficulty in walking, not elsewhere classified  Chronic pain of right knee  Cervicalgia     Problem List Patient Active Problem List   Diagnosis Date Noted  . Pulmonary vascular congestion 06/24/2018  . Chronic diastolic heart failure ([place]) 05/15/2018  . Dyspnea 05/15/2018  . Pulmonary nodules 05/11/2018  . Hypertensive urgency 05/11/2018  . Chronic respiratory failure with hypoxia ([place]) 05/10/2018  . 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 DDD (degenerative disc disease), lumbar 03/21/2018  . Primary osteoarthritis of both knees 03/21/2018  . Dysphagia 03/19/2018  . PVC (premature ventricular contraction) 03/12/2018  . LVH (left ventricular hypertrophy) 03/12/2018  . Generalized edema 03/12/2018  . Bilateral carotid artery stenosis 03/12/2018  . Weight gain 01/25/2018  . Hemorrhoids 01/05/2018  . Generalized postprandial abdominal pain 01/05/2018  . Hiatal hernia 01/05/2018  . Esophageal dysmotilities 01/05/2018  . Mixed hyperlipidemia 09/12/2017  . Multinodular goiter 07/27/2017  . Hyperparathyroidism ([place]) 06/13/2017  . Vitamin D deficiency 06/13/2017  . Hx of adenomatous colonic polyps 05/15/2017  . Eustachian tube dysfunction, right 05/15/2017  . Lymphatic edema 05/15/2017  . Hypertension 05/14/2017  . Anemia 05/11/2017  . Arrhythmia 05/11/2017  . Diverticulosis 05/11/2017  . GERD (gastroesophageal reflux disease) 05/11/2017  . Hypothyroidism 05/11/2017  . Lower extremity edema 05/11/2017  . Morbid obesity ([place]) 05/11/2017  . Polycystic ovary syndrome 05/11/2017  . OSA on CPAP 05/11/2017  . Neck pain 05/11/2017  . Allergic rhinitis 05/11/2017  . 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

## 2018-07-16 ENCOUNTER — Other Ambulatory Visit: Payer: Self-pay | Admitting: Endocrinology

## 2018-07-16 ENCOUNTER — Other Ambulatory Visit: Payer: Self-pay

## 2018-07-16 MED ORDER — LEVOTHYROXINE SODIUM 75 MCG PO TABS: ORAL_TABLET | ORAL | 0 refills | Status: DC

## 2018-07-17 ENCOUNTER — Ambulatory Visit (INDEPENDENT_AMBULATORY_CARE_PROVIDER_SITE_OTHER): Payer: BLUE CROSS/BLUE SHIELD | Admitting: Family Medicine

## 2018-07-17 ENCOUNTER — Other Ambulatory Visit: Payer: Self-pay

## 2018-07-17 ENCOUNTER — Encounter (INDEPENDENT_AMBULATORY_CARE_PROVIDER_SITE_OTHER): Payer: Self-pay | Admitting: Family Medicine

## 2018-07-17 VITALS — BP 177/105 | HR 89 | Temp 98.1°F | Ht 62.0 in | Wt 330.0 lb

## 2018-07-17 DIAGNOSIS — Z9189 Other specified personal risk factors, not elsewhere classified: Secondary | ICD-10-CM | POA: Diagnosis not present

## 2018-07-17 DIAGNOSIS — R7303 Prediabetes: Secondary | ICD-10-CM

## 2018-07-17 DIAGNOSIS — E559 Vitamin D deficiency, unspecified: Secondary | ICD-10-CM

## 2018-07-17 DIAGNOSIS — I10 Essential (primary) hypertension: Secondary | ICD-10-CM | POA: Diagnosis not present

## 2018-07-17 DIAGNOSIS — Z6841 Body Mass Index (BMI) 40.0 and over, adult: Secondary | ICD-10-CM

## 2018-07-17 NOTE — Progress Notes (Signed)
Office: [phone]  /  Fax: [phone]   HPI:   Chief Complaint: OBESITY Madison Palmer is here to discuss her progress with her obesity treatment plan. She is on the Category 3 plan + 100 calories and is following her eating plan approximately 70 % of the time. She states she is doing physical therapy for 60 minutes 2 times per week. Madison Palmer is feeling markedly better, she has more energy, doing more, and feeling like herself again. She says the next few weeks are slowing down with less doctor appointments. She is interested in a different meal plan.  Her weight is (!) 330 lb (149.7 kg) today and has not lost weight since her last visit. She has lost 27 lbs since starting treatment with [place].  Hypertension Madison Palmer is a 53 y.o. female with hypertension. Madison Palmer's blood pressure is elevated today. She denies chest pain, chest pressure, or headaches. She sees Cardiology. She is working on weight loss to help control her blood pressure with the goal of decreasing her risk of heart attack and stroke. Madison Palmer's blood pressure is not currently controlled.  Vitamin D Deficiency Madison Palmer has a diagnosis of vitamin D deficiency. She is currently taking prescription Vit D. She notes fatigue and denies nausea, vomiting or muscle weakness.  Pre-Diabetes Madison Palmer has a diagnosis of pre-diabetes based on her elevated Hgb A1c and was informed this puts her at greater risk of developing diabetes. She denies GI side effects of metformin or carbohydrate cravings. She continues to work on diet and exercise to decrease risk of diabetes. She denies hypoglycemia.  At risk for diabetes Madison Palmer is at higher than average risk for developing diabetes due to her obesity and pre-diabetes. She currently denies polyuria or polydipsia.  ASSESSMENT AND PLAN:  Essential hypertension  Vitamin D deficiency  Prediabetes - Plan: metFORMIN (GLUCOPHAGE) 500 MG tablet  At risk for diabetes mellitus  Class 3 severe obesity  with serious comorbidity and body mass index (BMI) of 60.0 to 69.9 in adult, unspecified obesity type ([place])  PLAN:  Hypertension We discussed sodium restriction, working on healthy weight loss, and a regular exercise program as the means to achieve improved blood pressure control. [name] agreed with this plan and agreed to follow up as directed. We will continue to monitor her blood pressure as well as her progress with the above lifestyle modifications. Madison Palmer is to follow up with Cardiology at previously scheduled appointment. She agrees to increase hydralazine to 2 tablets in the morning (this would be a increase of 1 additional tablet in the morning), 1 tablet at 2pm, and 2 tablets in the evening. She will watch for signs of hypotension as she continues her lifestyle modifications. Madison Palmer agrees to follow up with our clinic in 2 weeks.  Vitamin D Deficiency Madison Palmer was informed that low vitamin D levels contributes to fatigue and are associated with obesity, breast, and colon cancer. [name] agrees to continue taking prescription Vit D @50 ,000 IU every week, no refill needed. She will follow up for routine testing of vitamin D, at least 2-3 times per year. She was informed of the risk of over-replacement of vitamin D and agrees to not increase her dose unless she discusses this with [place] first. Madison Palmer agrees to follow up with our clinic in 2 weeks.  Pre-Diabetes Madison Palmer will continue to work on weight loss, exercise, and decreasing simple carbohydrates in her diet to help decrease the risk of diabetes. We dicussed metformin including benefits and risks. She was informed that  eating too many simple carbohydrates or too many calories at one sitting increases the likelihood of GI side effects. Madison Palmer agrees to continue taking metformin 500 mg PO q AM #30 and we will refill for 1 month. Madison Palmer agrees to follow up with our clinic in 2 weeks as directed to monitor her progress.  Diabetes risk counseling  Madison Palmer was given extended (15 minutes) diabetes prevention counseling today. She is 53 y.o. female and has risk factors for diabetes including obesity and pre-diabetes. We discussed intensive lifestyle modifications today with an emphasis on weight loss as well as increasing exercise and decreasing simple carbohydrates in her diet.  Obesity Madison Palmer is currently in the action stage of change. As such, her goal is to continue with weight loss efforts She has agreed to follow the Pescatarian eating plan + 300 calories Madison Palmer has been instructed to work up to a goal of 150 minutes of combined cardio and strengthening exercise per week for weight loss and overall health benefits. We discussed the following Behavioral Modification Strategies today: increasing lean protein intake, increasing vegetables and work on meal planning and easy cooking plans, and planning for success   Madison Palmer has agreed to follow up with our clinic in 2 weeks. She was informed of the importance of frequent follow up visits to maximize her success with intensive lifestyle modifications for her multiple health conditions.  ALLERGIES: Allergies  Allergen Reactions  . Fentanyl Hives  . Levofloxacin Hives  . Midazolam Hives  . Pollen Extract     seasonal  . Atorvastatin     Muscle pain in legs   . Rosuvastatin     Abdominal pain    MEDICATIONS: Current Outpatient Medications on File Prior to Visit  Medication Sig Dispense Refill  . acetaminophen (TYLENOL) 500 MG tablet Take 1,000 mg by mouth 3 (three) times daily as needed for moderate pain or headache.    Marland Kitchen atenolol (TENORMIN) 25 MG tablet Take 1 tablet (25 mg total) by mouth 2 (two) times daily. 180 tablet 3  . benzonatate (TESSALON) 100 MG capsule Take 1 capsule (100 mg total) by mouth 3 (three) times daily as needed for cough. 20 capsule 0  . cetirizine (ZYRTEC ALLERGY) 10 MG tablet Take 1 tablet (10 mg total) by mouth daily. (Patient taking differently: Take 10 mg  by mouth as needed for allergies. Use as needed for allergies) 30 tablet 5  . esomeprazole (NEXIUM) 40 MG capsule TAKE 1 CAPSULE BY MOUTH TWICE DAILY 180 capsule 0  . fluticasone (FLONASE) 50 MCG/ACT nasal spray Place 2 sprays into both nostrils daily.    . furosemide (LASIX) 40 MG tablet Take 1 tablet (40 mg total) by mouth 2 (two) times daily. 180 tablet 1  . hydrALAZINE (APRESOLINE) 50 MG tablet Take 1tab in morning and afternoon, and 2tabs at bedtime (Patient taking differently: Take 2tab in morning , 1 @ 2pm and 2tabs at bedtime) 120 tablet 6  . levothyroxine (SYNTHROID, LEVOTHROID) 75 MCG tablet TK 1 T PO QD  MONDAY-FRIDAY 20 tablet 0  . lisinopril (PRINIVIL,ZESTRIL) 20 MG tablet Take 1 tablet (20 mg total) by mouth daily. (Patient taking differently: Take 40 mg by mouth daily. ) 90 tablet 3  . potassium chloride SA (K-DUR,KLOR-CON) 20 MEQ tablet Take 2 tablets (40 mEq total) by mouth daily. 180 tablet 2  . predniSONE (DELTASONE) 5 MG tablet Take 2 tablets (10 mg total) by mouth daily. (Patient taking differently: Take 5 mg by mouth daily. ) 60 tablet 2  .  Vitamin D, Ergocalciferol, (DRISDOL) 1.25 MG (50000 UT) CAPS capsule TAKE 1 CAPSULE BY MOUTH EVERY 7 DAYS 12 capsule 0  . metFORMIN (GLUCOPHAGE) 500 MG tablet Take 1 tablet (500 mg total) by mouth daily with breakfast. 30 tablet 0   No current facility-administered medications on file prior to visit.     PAST MEDICAL HISTORY: Past Medical History:  Diagnosis Date  . Anemia   . Anxiety   . Arrhythmia    tachycardia  . Arthritis   . Chickenpox   . Depression   . Diverticulitis   . GERD (gastroesophageal reflux disease)   . Glaucoma   . Hyperlipidemia   . Hyperparathyroidism ([place])   . Hypertension   . Inflammatory polyps of colon ([place])   . LVH (left ventricular hypertrophy)   . Lymphedema   . PCOS (polycystic ovarian syndrome)   . Prediabetes   . Recurrent UTI   . Sleep apnea    CPAP  . Thyroid disease   . Vitamin D  deficiency     PAST SURGICAL HISTORY: Past Surgical History:  Procedure Laterality Date  . BREAST BIOPSY  2015  . CESAREAN SECTION  2004  . INNER EAR SURGERY     ear and sinus surgery  . LAPAROSCOPIC REPAIR AND REMOVAL OF GASTRIC BAND    . OOPHORECTOMY Left   . TONSILLECTOMY AND ADENOIDECTOMY      SOCIAL HISTORY: Social History   Tobacco Use  . Smoking status: Never Smoker  . Smokeless tobacco: Never Used  Substance Use Topics  . Alcohol use: Yes    Comment: social  . Drug use: No    FAMILY HISTORY: Family History  Adopted: Yes  Problem Relation Age of Onset  . Hearing loss Son        right   . Healthy Son     ROS: Review of Systems  Constitutional: Positive for malaise/fatigue. Negative for weight loss.  Cardiovascular: Negative for chest pain.       Negative chest pressure  Gastrointestinal: Negative for nausea and vomiting.  Genitourinary: Negative for frequency.  Musculoskeletal:       Negative muscle weakness  Neurological: Negative for headaches.  Endo/Heme/Allergies: Negative for polydipsia.       Negative hypoglycemia    PHYSICAL EXAM: Blood pressure (!) 177/105, pulse 89, temperature 98.1 F (36.7 C), temperature source Oral, height 5\' 2"  (1.575 m), weight (!) 330 lb (149.7 kg), last menstrual period 05/02/2017, SpO2 94 %. Body mass index is 60.36 kg/m. Physical Exam Vitals signs reviewed.  Constitutional:      Appearance: Normal appearance. She is obese.  Cardiovascular:     Rate and Rhythm: Normal rate.     Pulses: Normal pulses.  Pulmonary:     Effort: Pulmonary effort is normal.     Breath sounds: Normal breath sounds.  Musculoskeletal: Normal range of motion.  Skin:    General: Skin is warm and dry.  Neurological:     Mental Status: She is alert and oriented to person, place, and time.  Psychiatric:        Mood and Affect: Mood normal.        Behavior: Behavior normal.     RECENT LABS AND TESTS: BMET    Component Value  Date/Time   NA 143 07/08/2018 1359   K 3.9 07/08/2018 1359   CL 98 07/08/2018 1359   CO2 27 07/08/2018 1359   GLUCOSE 103 (H) 07/08/2018 1359   GLUCOSE 125 (H) 06/18/2018 1317  BUN 30 (H) 07/08/2018 1359   CREATININE 0.88 07/08/2018 1359   CREATININE 0.94 12/21/2017 1640   CALCIUM 10.5 (H) 07/08/2018 1359   GFRNONAA 76 07/08/2018 1359   GFRAA 87 07/08/2018 1359   Lab Results  Component Value Date   HGBA1C 6.0 (H) 02/27/2018   HGBA1C 5.7 09/10/2017   HGBA1C 5.9 05/29/2017   Lab Results  Component Value Date   INSULIN 9.6 02/27/2018   CBC    Component Value Date/Time   WBC 6.8 05/13/2018 0515   RBC 5.05 05/13/2018 0515   HGB 12.9 05/13/2018 0515   HCT 43.5 05/13/2018 0515   PLT 253 05/13/2018 0515   MCV 86.1 05/13/2018 0515   MCH 25.5 (L) 05/13/2018 0515   MCHC 29.7 (L) 05/13/2018 0515   RDW 16.8 (H) 05/13/2018 0515   LYMPHSABS 1.0 05/11/2018 0630   MONOABS 0.5 05/11/2018 0630   EOSABS 0.1 05/11/2018 0630   BASOSABS 0.0 05/11/2018 0630   Iron/TIBC/Ferritin/ %Sat    Component Value Date/Time   IRON 44 02/20/2018 1536   IRON 81 05/29/2017   TIBC 347 05/29/2017   IRONPCTSAT 8.8 (L) 02/20/2018 1536   Lipid Panel     Component Value Date/Time   CHOL 196 02/27/2018 1233   TRIG 142 02/27/2018 1233   HDL 46 02/27/2018 1233   CHOLHDL 5 09/10/2017 1156   VLDL 26.6 09/10/2017 1156   LDLCALC 122 (H) 02/27/2018 1233   Hepatic Function Panel     Component Value Date/Time   PROT 6.1 (L) 05/11/2018 0630   PROT 6.9 02/27/2018 1233   ALBUMIN 3.1 (L) 05/11/2018 0630   ALBUMIN 4.3 02/27/2018 1233   AST 25 05/11/2018 0630   ALT 39 05/11/2018 0630   ALKPHOS 71 05/11/2018 0630   BILITOT 1.6 (H) 05/11/2018 0630   BILITOT 0.9 02/27/2018 1233   BILIDIR 0.3 (H) 05/11/2018 0630   IBILI 1.3 (H) 05/11/2018 0630      Component Value Date/Time   TSH 0.938 05/11/2018 0630   TSH 1.450 02/27/2018 1233   TSH 0.97 12/11/2017 1158   TSH 1.28 05/29/2017      OBESITY  BEHAVIORAL INTERVENTION VISIT  Today's visit was # 9   Starting weight: 357 lbs Starting date: 02/27/18 Today's weight : 330 lbs  Today's date: 07/17/2018 Total lbs lost to date: 27    07/17/2018  Height 5\' 2"  (1.575 m)  Weight 330 lb (149.7 kg) (A)  BMI (Calculated) 60.34  BLOOD PRESSURE - SYSTOLIC 301  BLOOD PRESSURE - DIASTOLIC 601   Body Fat % 64.2 %     ASK: We discussed the diagnosis of obesity with [name] today and Odalys agreed to give [place] permission to discuss obesity behavioral modification therapy today.  ASSESS: Eileene has the diagnosis of obesity and her BMI today is 60.34 Genifer is in the action stage of change   ADVISE: Madison Palmer was educated on the multiple health risks of obesity as well as the benefit of weight loss to improve her health. She was advised of the need for long term treatment and the importance of lifestyle modifications to improve her current health and to decrease her risk of future health problems.  AGREE: Multiple dietary modification options and treatment options were discussed and  Amri agreed to follow the recommendations documented in the above note.  ARRANGE: Carroll was educated on the importance of frequent visits to treat obesity as outlined per CMS and USPSTF guidelines and agreed to schedule her next follow up appointment today.  I, Trixie Dredge, am acting as transcriptionist for Ilene Qua, MD  I have reviewed the above documentation for accuracy and completeness, and I agree with the above. - Ilene Qua, MD

## 2018-07-18 ENCOUNTER — Other Ambulatory Visit (INDEPENDENT_AMBULATORY_CARE_PROVIDER_SITE_OTHER): Payer: Self-pay | Admitting: Family Medicine

## 2018-07-18 ENCOUNTER — Ambulatory Visit: Payer: BLUE CROSS/BLUE SHIELD | Admitting: Physical Therapy

## 2018-07-18 ENCOUNTER — Encounter (INDEPENDENT_AMBULATORY_CARE_PROVIDER_SITE_OTHER): Payer: Self-pay

## 2018-07-18 DIAGNOSIS — R7303 Prediabetes: Secondary | ICD-10-CM

## 2018-07-18 MED ORDER — METFORMIN HCL 500 MG PO TABS: 500.0000 mg | ORAL_TABLET | Freq: Every day | ORAL | 0 refills | Status: DC

## 2018-07-19 ENCOUNTER — Encounter: Payer: Self-pay | Admitting: Nurse Practitioner

## 2018-07-19 ENCOUNTER — Other Ambulatory Visit: Payer: Self-pay | Admitting: Nurse Practitioner

## 2018-07-19 DIAGNOSIS — I10 Essential (primary) hypertension: Secondary | ICD-10-CM

## 2018-07-19 MED ORDER — HYDRALAZINE HCL 50 MG PO TABS: ORAL_TABLET | ORAL | 1 refills | Status: DC

## 2018-07-23 ENCOUNTER — Ambulatory Visit: Payer: BLUE CROSS/BLUE SHIELD | Admitting: Physical Therapy

## 2018-07-23 ENCOUNTER — Encounter (INDEPENDENT_AMBULATORY_CARE_PROVIDER_SITE_OTHER): Payer: Self-pay

## 2018-07-25 ENCOUNTER — Ambulatory Visit: Payer: BLUE CROSS/BLUE SHIELD | Admitting: Physical Therapy

## 2018-07-25 ENCOUNTER — Encounter (INDEPENDENT_AMBULATORY_CARE_PROVIDER_SITE_OTHER): Payer: Self-pay

## 2018-07-26 ENCOUNTER — Ambulatory Visit: Payer: BLUE CROSS/BLUE SHIELD | Admitting: Endocrinology

## 2018-07-30 ENCOUNTER — Ambulatory Visit (INDEPENDENT_AMBULATORY_CARE_PROVIDER_SITE_OTHER): Payer: BLUE CROSS/BLUE SHIELD | Admitting: Endocrinology

## 2018-07-30 ENCOUNTER — Encounter (INDEPENDENT_AMBULATORY_CARE_PROVIDER_SITE_OTHER): Payer: Self-pay | Admitting: Family Medicine

## 2018-07-30 ENCOUNTER — Other Ambulatory Visit: Payer: Self-pay

## 2018-07-30 ENCOUNTER — Encounter (INDEPENDENT_AMBULATORY_CARE_PROVIDER_SITE_OTHER): Payer: Self-pay

## 2018-07-30 DIAGNOSIS — E213 Hyperparathyroidism, unspecified: Secondary | ICD-10-CM

## 2018-07-30 DIAGNOSIS — E039 Hypothyroidism, unspecified: Secondary | ICD-10-CM | POA: Diagnosis not present

## 2018-07-30 DIAGNOSIS — E042 Nontoxic multinodular goiter: Secondary | ICD-10-CM

## 2018-07-30 NOTE — Patient Instructions (Addendum)
blood tests are requested for you today.  We'll let you know about the results.   Please continue the same synthroid.  Let's recheck the ultrasound.  you will receive a phone call, about a day and time for an appointment.  Please come back for a follow-up appointment in 6 months.

## 2018-07-30 NOTE — Progress Notes (Addendum)
Subjective:    Patient ID: Madison Palmer, female    DOB: 13-Oct-1965, 53 y.o.   MRN: [id number]  HPI  telehealth visit today via doxy video visit.  Alternatives to telehealth are presented to this patient, and the patient agrees to the telehealth visit. Pt is advised of the cost of the visit, and agrees to this, also.   Patient is at home, and I am at the office.   The state of at least three ongoing medical problems is addressed today, with interval history of each noted here: Hypothyroidism (dx'ed in 1992.  she has been on prescribed thyroid hormone therapy since then).  she prefers to take synthroid M-F, rather than qd.  No change in leg edema.  This is a stable problem. Multinodular goiter: she has had surveillance ultrasounds, when she lived in [place]; he had a thyroid bx in 2014, and again in early 2019).  She does not notice the goiter.  This is a stable problem. hyperparathyroidism.  It has been uncertain if it is primary and secondary.   She denies cramps. This is a stable problem. Vit-D def: she was rx'ed high-dose in 7/19.  She takes 50,000 units per week.  Denies numbness.  This is a stable problem. Past Medical History:  Diagnosis Date  . Anemia   . Anxiety   . Arrhythmia    tachycardia  . Arthritis   . Chickenpox   . Depression   . Diverticulitis   . GERD (gastroesophageal reflux disease)   . Glaucoma   . Hyperlipidemia   . Hyperparathyroidism ([place])   . Hypertension   . Inflammatory polyps of colon ([place])   . LVH (left ventricular hypertrophy)   . Lymphedema   . PCOS (polycystic ovarian syndrome)   . Prediabetes   . Recurrent UTI   . Sleep apnea    CPAP  . Thyroid disease   . Vitamin D deficiency     Past Surgical History:  Procedure Laterality Date  . BREAST BIOPSY  2015  . CESAREAN SECTION  2004  . INNER EAR SURGERY     ear and sinus surgery  . LAPAROSCOPIC REPAIR AND REMOVAL OF GASTRIC BAND    . OOPHORECTOMY Left   . TONSILLECTOMY AND ADENOIDECTOMY      Social History   Socioeconomic History  . Marital status: Married    Spouse name: Not on file  . Number of children: 1  . Years of education: Not on file  . Highest education level: Not on file  Occupational History  . Occupation: Unemployed  Social Needs  . Financial resource strain: Not on file  . Food insecurity:    Worry: Not on file    Inability: Not on file  . Transportation needs:    Medical: Not on file    Non-medical: Not on file  Tobacco Use  . Smoking status: Never Smoker  . Smokeless tobacco: Never Used  Substance and Sexual Activity  . Alcohol use: Yes    Comment: social  . Drug use: No  . Sexual activity: Not Currently  Lifestyle  . Physical activity:    Days per week: Not on file    Minutes per session: Not on file  . Stress: Not on file  Relationships  . Social connections:    Talks on phone: Not on file    Gets together: Not on file    Attends religious service: Not on file    Active member of club or organization: Not  on file    Attends meetings of clubs or organizations: Not on file    Relationship status: Not on file  . Intimate partner violence:    Fear of current or ex partner: Not on file    Emotionally abused: Not on file    Physically abused: Not on file    Forced sexual activity: Not on file  Other Topics Concern  . Not on file  Social History Narrative   Lives with son and companion.      Current Outpatient Medications on File Prior to Visit  Medication Sig Dispense Refill  . acetaminophen (TYLENOL) 500 MG tablet Take 1,000 mg by mouth 3 (three) times daily as needed for moderate pain or headache.    Marland Kitchen atenolol (TENORMIN) 25 MG tablet Take 1 tablet (25 mg total) by mouth 2 (two) times daily. 180 tablet 3  . esomeprazole (NEXIUM) 40 MG capsule TAKE 1 CAPSULE BY MOUTH TWICE DAILY 180 capsule 0  . fluticasone (FLONASE) 50 MCG/ACT nasal spray Place 2 sprays into both nostrils daily.    . furosemide (LASIX) 40 MG tablet Take 1 tablet  (40 mg total) by mouth 2 (two) times daily. 180 tablet 1  . hydrALAZINE (APRESOLINE) 50 MG tablet Take 2tab in morning , 1 @ 2pm and 2tabs at bedtime 150 tablet 1  . levothyroxine (SYNTHROID, LEVOTHROID) 75 MCG tablet TK 1 T PO QD  MONDAY-FRIDAY 20 tablet 0  . lisinopril (PRINIVIL,ZESTRIL) 20 MG tablet Take 1 tablet (20 mg total) by mouth daily. (Patient taking differently: Take 40 mg by mouth daily. ) 90 tablet 3  . metFORMIN (GLUCOPHAGE) 500 MG tablet Take 1 tablet (500 mg total) by mouth daily with breakfast for 30 days. 30 tablet 0  . potassium chloride SA (K-DUR,KLOR-CON) 20 MEQ tablet Take 2 tablets (40 mEq total) by mouth daily. 180 tablet 2  . predniSONE (DELTASONE) 5 MG tablet Take 2 tablets (10 mg total) by mouth daily. (Patient taking differently: Take 5 mg by mouth daily. ) 60 tablet 2  . Vitamin D, Ergocalciferol, (DRISDOL) 1.25 MG (50000 UT) CAPS capsule TAKE 1 CAPSULE BY MOUTH EVERY 7 DAYS 12 capsule 0  . benzonatate (TESSALON) 100 MG capsule Take 1 capsule (100 mg total) by mouth 3 (three) times daily as needed for cough. (Patient not taking: Reported on 07/30/2018) 20 capsule 0  . cetirizine (ZYRTEC ALLERGY) 10 MG tablet Take 1 tablet (10 mg total) by mouth daily. (Patient not taking: Reported on 07/30/2018) 30 tablet 5   No current facility-administered medications on file prior to visit.     Allergies  Allergen Reactions  . Fentanyl Hives  . Levofloxacin Hives  . Midazolam Hives  . Pollen Extract     seasonal  . Atorvastatin     Muscle pain in legs   . Rosuvastatin     Abdominal pain    Family History  Adopted: Yes  Problem Relation Age of Onset  . Hearing loss Son        right   . Healthy Son     LMP 05/02/2017    Review of Systems Denies neck pain and myalgias.      Objective:   Physical Exam   Lab Results  Component Value Date   TSH 0.938 05/11/2018   T3TOTAL 91 02/27/2018      Assessment & Plan:  MNG: due for recheck Hypothyroidism:  well-replaced Hyperparathyroidism: due for recheck.  Vit-D def: due for recheck.  Patient Instructions  blood tests  are requested for you today.  We'll let you know about the results.   Please continue the same synthroid.  Let's recheck the ultrasound.  you will receive a phone call, about a day and time for an appointment.  Please come back for a follow-up appointment in 6 months.

## 2018-07-31 ENCOUNTER — Ambulatory Visit (HOSPITAL_BASED_OUTPATIENT_CLINIC_OR_DEPARTMENT_OTHER): Payer: BLUE CROSS/BLUE SHIELD

## 2018-08-05 ENCOUNTER — Ambulatory Visit (INDEPENDENT_AMBULATORY_CARE_PROVIDER_SITE_OTHER): Payer: BLUE CROSS/BLUE SHIELD | Admitting: Family Medicine

## 2018-08-05 ENCOUNTER — Encounter (INDEPENDENT_AMBULATORY_CARE_PROVIDER_SITE_OTHER): Payer: Self-pay | Admitting: Family Medicine

## 2018-08-05 ENCOUNTER — Ambulatory Visit: Payer: BLUE CROSS/BLUE SHIELD | Admitting: Pulmonary Disease

## 2018-08-05 ENCOUNTER — Other Ambulatory Visit: Payer: Self-pay

## 2018-08-05 DIAGNOSIS — I10 Essential (primary) hypertension: Secondary | ICD-10-CM | POA: Diagnosis not present

## 2018-08-05 DIAGNOSIS — E559 Vitamin D deficiency, unspecified: Secondary | ICD-10-CM | POA: Diagnosis not present

## 2018-08-05 DIAGNOSIS — E038 Other specified hypothyroidism: Secondary | ICD-10-CM | POA: Diagnosis not present

## 2018-08-05 DIAGNOSIS — Z6841 Body Mass Index (BMI) 40.0 and over, adult: Secondary | ICD-10-CM

## 2018-08-05 NOTE — Progress Notes (Signed)
Office: [phone]  /  Fax: [phone] TeleHealth Visit:  Madison Palmer has verbally consented to this TeleHealth visit today. The patient is located at home, the provider is located at the [facility] office. The participants in this visit include the listed provider and patient and provider's assistant. [name] was unable to use realtime audiovisual technology today and the telehealth visit was conducted via telephone.   HPI:   Chief Complaint: OBESITY Madison Palmer is here to discuss her progress with her obesity treatment plan. She is on the Pescatarian eating plan + 300 calories and is following her eating plan approximately 0 % of the time. She states she is walking around the house. Madison Palmer has been seeing some of her doctors virtually. She has only followed the Pescatarian plan for 1 day since last appointment. she had some challenges finding food on the Pescatarian plan. She has tried to make smarter choices when eating.  We were unable to weigh the patient today for this TeleHealth visit. She feels as if she has lost 6 lbs since her last visit. She has lost 27-33 lbs since starting treatment with [place].  Hypertension Madison Palmer is a 53 y.o. female with hypertension. Madison Palmer is seeing cardiology and she states he blood pressure yesterday was 150/91. She has not been as compliant with taking medications as she was previously. She denies chest pain, chest pressure, or headaches. She is working on weight loss to help control her blood pressure with the goal of decreasing her risk of heart attack and stroke. Madison Palmer's blood pressure is not currently controlled.  Vitamin D Deficiency Madison Palmer has a diagnosis of vitamin D deficiency. She is currently taking prescription Vit D and denies nausea, vomiting or muscle weakness.  Hypothyroidism Madison Palmer has a diagnosis of hypothyroidism. She is on levothyroxine. She is seeing Dr. Loanne Drilling of Endocrinology. She denies hot or cold  intolerance or palpitations, but does admit to ongoing fatigue.  ASSESSMENT AND PLAN:  Essential hypertension  Vitamin D deficiency  Other specified hypothyroidism  Class 3 severe obesity with serious comorbidity and body mass index (BMI) of 60.0 to 69.9 in adult, unspecified obesity type (Madison Palmer)  PLAN:  Hypertension We discussed sodium restriction, working on healthy weight loss, and a regular exercise program as the means to achieve improved blood pressure control. Madison Palmer agreed with this plan and agreed to follow up as directed. We will continue to monitor her blood pressure as well as her progress with the above lifestyle modifications. Madison Palmer was encouraged to take her medications as prescribed and we will follow up on blood pressure at next appointment. She will watch for signs of hypotension as she continues her lifestyle modifications. Madison Palmer agrees to follow up with our clinic in 2 weeks.  Vitamin D Deficiency [name] was informed that low vitamin D levels contributes to fatigue and are associated with obesity, breast, and colon cancer. [name] agrees to continue taking prescription Vit D @50 ,000 IU every week, no refill needed. She will follow up for routine testing of vitamin D, at least 2-3 times per year. She was informed of the risk of over-replacement of vitamin D and agrees to not increase her dose unless she discusses this with [place] first. [name] agrees to follow up with our clinic in 2 weeks.  Hypothyroidism [name] was informed of the importance of good thyroid control to help with weight loss efforts. She was also informed that supertheraputic thyroid levels are dangerous and will not improve weight loss results. She  is to follow up with getting blood work ordered by Dr. Loanne Drilling. Madison Palmer agrees to follow up with our clinic in 2 weeks.  I spent > than 50% of the 25 minute visit on counseling as documented in the note.  Obesity Madison Palmer is currently in the action stage of change.  As such, her goal is to continue with weight loss efforts She has agreed to follow the Category 3 plan Madison Palmer has been instructed to work up to a goal of 150 minutes of combined cardio and strengthening exercise per week for weight loss and overall health benefits. We discussed the following Behavioral Modification Strategies today: increasing lean protein intake, increasing vegetables, work on meal planning and easy cooking plans, better snacking choices, and planning for success   Madison Palmer has agreed to follow up with our clinic in 2 weeks. She was informed of the importance of frequent follow up visits to maximize her success with intensive lifestyle modifications for her multiple health conditions.  ALLERGIES: Allergies  Allergen Reactions  . Fentanyl Hives  . Levofloxacin Hives  . Midazolam Hives  . Pollen Extract     seasonal  . Atorvastatin     Muscle pain in legs   . Rosuvastatin     Abdominal pain    MEDICATIONS: Current Outpatient Medications on File Prior to Visit  Medication Sig Dispense Refill  . acetaminophen (TYLENOL) 500 MG tablet Take 1,000 mg by mouth 3 (three) times daily as needed for moderate pain or headache.    Madison Palmer atenolol (TENORMIN) 25 MG tablet Take 1 tablet (25 mg total) by mouth 2 (two) times daily. 180 tablet 3  . benzonatate (TESSALON) 100 MG capsule Take 1 capsule (100 mg total) by mouth 3 (three) times daily as needed for cough. (Patient not taking: Reported on 07/30/2018) 20 capsule 0  . cetirizine (ZYRTEC ALLERGY) 10 MG tablet Take 1 tablet (10 mg total) by mouth daily. (Patient not taking: Reported on 07/30/2018) 30 tablet 5  . esomeprazole (NEXIUM) 40 MG capsule TAKE 1 CAPSULE BY MOUTH TWICE DAILY 180 capsule 0  . fluticasone (FLONASE) 50 MCG/ACT nasal spray Place 2 sprays into both nostrils daily.    . furosemide (LASIX) 40 MG tablet Take 1 tablet (40 mg total) by mouth 2 (two) times daily. 180 tablet 1  . hydrALAZINE (APRESOLINE) 50 MG tablet Take 2tab  in morning , 1 @ 2pm and 2tabs at bedtime 150 tablet 1  . levothyroxine (SYNTHROID, LEVOTHROID) 75 MCG tablet TK 1 T PO QD  MONDAY-FRIDAY 20 tablet 0  . lisinopril (PRINIVIL,ZESTRIL) 20 MG tablet Take 1 tablet (20 mg total) by mouth daily. (Patient taking differently: Take 40 mg by mouth daily. ) 90 tablet 3  . metFORMIN (GLUCOPHAGE) 500 MG tablet Take 1 tablet (500 mg total) by mouth daily with breakfast for 30 days. 30 tablet 0  . potassium chloride SA (K-DUR,KLOR-CON) 20 MEQ tablet Take 2 tablets (40 mEq total) by mouth daily. 180 tablet 2  . predniSONE (DELTASONE) 5 MG tablet Take 2 tablets (10 mg total) by mouth daily. (Patient taking differently: Take 5 mg by mouth daily. ) 60 tablet 2  . Vitamin D, Ergocalciferol, (DRISDOL) 1.25 MG (50000 UT) CAPS capsule TAKE 1 CAPSULE BY MOUTH EVERY 7 DAYS 12 capsule 0   No current facility-administered medications on file prior to visit.     PAST MEDICAL HISTORY: Past Medical History:  Diagnosis Date  . Anemia   . Anxiety   . Arrhythmia    tachycardia  .  Arthritis   . Chickenpox   . Depression   . Diverticulitis   . GERD (gastroesophageal reflux disease)   . Glaucoma   . Hyperlipidemia   . Hyperparathyroidism ([place])   . Hypertension   . Inflammatory polyps of colon ([place])   . LVH (left ventricular hypertrophy)   . Lymphedema   . PCOS (polycystic ovarian syndrome)   . Prediabetes   . Recurrent UTI   . Sleep apnea    CPAP  . Thyroid disease   . Vitamin D deficiency     PAST SURGICAL HISTORY: Past Surgical History:  Procedure Laterality Date  . BREAST BIOPSY  2015  . CESAREAN SECTION  2004  . INNER EAR SURGERY     ear and sinus surgery  . LAPAROSCOPIC REPAIR AND REMOVAL OF GASTRIC BAND    . OOPHORECTOMY Left   . TONSILLECTOMY AND ADENOIDECTOMY      SOCIAL HISTORY: Social History   Tobacco Use  . Smoking status: Never Smoker  . Smokeless tobacco: Never Used  Substance Use Topics  . Alcohol use: Yes    Comment: social  .  Drug use: No    FAMILY HISTORY: Family History  Adopted: Yes  Problem Relation Age of Onset  . Hearing loss Son        right   . Healthy Son     ROS: Review of Systems  Constitutional: Positive for malaise/fatigue and weight loss.  Cardiovascular: Negative for chest pain and palpitations.       Negative chest pressure  Gastrointestinal: Negative for nausea and vomiting.  Musculoskeletal:       Negative muscle weakness  Neurological: Negative for headaches.  Endo/Heme/Allergies:       Negative hot/cold intolerance    PHYSICAL EXAM: Pt in no acute distress  RECENT LABS AND TESTS: BMET    Component Value Date/Time   NA 143 07/08/2018 1359   K 3.9 07/08/2018 1359   CL 98 07/08/2018 1359   CO2 27 07/08/2018 1359   GLUCOSE 103 (H) 07/08/2018 1359   GLUCOSE 125 (H) 06/18/2018 1317   BUN 30 (H) 07/08/2018 1359   CREATININE 0.88 07/08/2018 1359   CREATININE 0.94 12/21/2017 1640   CALCIUM 10.5 (H) 07/08/2018 1359   GFRNONAA 76 07/08/2018 1359   GFRAA 87 07/08/2018 1359   Lab Results  Component Value Date   HGBA1C 6.0 (H) 02/27/2018   HGBA1C 5.7 09/10/2017   HGBA1C 5.9 05/29/2017   Lab Results  Component Value Date   INSULIN 9.6 02/27/2018   CBC    Component Value Date/Time   WBC 6.8 05/13/2018 0515   RBC 5.05 05/13/2018 0515   HGB 12.9 05/13/2018 0515   HCT 43.5 05/13/2018 0515   PLT 253 05/13/2018 0515   MCV 86.1 05/13/2018 0515   MCH 25.5 (L) 05/13/2018 0515   MCHC 29.7 (L) 05/13/2018 0515   RDW 16.8 (H) 05/13/2018 0515   LYMPHSABS 1.0 05/11/2018 0630   MONOABS 0.5 05/11/2018 0630   EOSABS 0.1 05/11/2018 0630   BASOSABS 0.0 05/11/2018 0630   Iron/TIBC/Ferritin/ %Sat    Component Value Date/Time   IRON 44 02/20/2018 1536   IRON 81 05/29/2017   TIBC 347 05/29/2017   IRONPCTSAT 8.8 (L) 02/20/2018 1536   Lipid Panel     Component Value Date/Time   CHOL 196 02/27/2018 1233   TRIG 142 02/27/2018 1233   HDL 46 02/27/2018 1233   CHOLHDL 5  09/10/2017 1156   VLDL 26.6 09/10/2017 1156   LDLCALC  122 (H) 02/27/2018 1233   Hepatic Function Panel     Component Value Date/Time   PROT 6.1 (L) 05/11/2018 0630   PROT 6.9 02/27/2018 1233   ALBUMIN 3.1 (L) 05/11/2018 0630   ALBUMIN 4.3 02/27/2018 1233   AST 25 05/11/2018 0630   ALT 39 05/11/2018 0630   ALKPHOS 71 05/11/2018 0630   BILITOT 1.6 (H) 05/11/2018 0630   BILITOT 0.9 02/27/2018 1233   BILIDIR 0.3 (H) 05/11/2018 0630   IBILI 1.3 (H) 05/11/2018 0630      Component Value Date/Time   TSH 0.938 05/11/2018 0630   TSH 1.450 02/27/2018 1233   TSH 0.97 12/11/2017 1158   TSH 1.28 05/29/2017      I, Trixie Dredge, am acting as transcriptionist for Ilene Qua, MD  I have reviewed the above documentation for accuracy and completeness, and I agree with the above. - Ilene Qua, MD

## 2018-08-06 ENCOUNTER — Ambulatory Visit: Payer: BLUE CROSS/BLUE SHIELD | Admitting: Physical Therapy

## 2018-08-07 ENCOUNTER — Telehealth: Payer: Self-pay | Admitting: Cardiology

## 2018-08-07 NOTE — Telephone Encounter (Signed)
Received Office Note from [facility] on 08/07/18, Appt 08/15/18 @ 1:40PM. [place]

## 2018-08-13 ENCOUNTER — Telehealth: Payer: Self-pay | Admitting: Cardiology

## 2018-08-13 NOTE — Telephone Encounter (Signed)
LVM to pre reg. 08-13-18 ST °

## 2018-08-14 NOTE — Progress Notes (Signed)
Virtual Visit via Video Note   This visit type was conducted due to national recommendations for restrictions regarding the COVID-19 Pandemic (e.g. social distancing) in an effort to limit this patient's exposure and mitigate transmission in our community.  Due to her co-morbid illnesses, this patient is at least at moderate risk for complications without adequate follow up.  This format is felt to be most appropriate for this patient at this time.  All issues noted in this document were discussed and addressed.  A limited physical exam was performed with this format.  Please refer to the patient's chart for her consent to telehealth for [facility].   Evaluation Performed:  Follow-up visit  Date:  08/15/2018   ID:  Madison Palmer, DOB [birth year], MRN [id number]  Patient Location: Home Provider Location: Home  PCP:  [name], Charlene Brooke, NP  Cardiologist:  Minus Breeding, MD  Electrophysiologist:  None   Chief Complaint:  Dyspnea  History of Present Illness:    Madison Palmer is a 53 y.o. female with dysnea.  She had an echo in Nov with a normal EF of 60 - 65% with severe concentric LVH.   She also is noted to have a past history of difficult to control hypertension.  She moved here from [place].  She has had LVH with preserved ejection fraction.  She had some mild internal carotid artery plaque noted on Doppler in the past.  She had a perfusion study in 2018 that demonstrated no evidence of ischemia or infarct.  She is been managed for her hypertension.   She was in the hospital and discharged on 1/13.  She had respiratory failure.  She was noted to have multiple pulmonary nodules.  She was seen by pulmonary.   She was felt that it was less likely that she had acute diastolic HF.   Since I last saw her she has seen her PCP and had increased weight noted and her Lasix was increased.  I reviewed these and pulmonary records for this visit.  She saw Dr. Elsworth Soho and is being treated with  steroids for pulmonary nodules.  There was a discussion about bronchoscopy but they held off because of previous difficulty with this.  She is having routine PT.  At the last visit I increased her lisinopril.  Since then she has seen her nephrologist at [facility].  It sounds like she has had her lisinopril increased again.  Her blood pressures are still elevated as below.  She is actually breathing a lot better.  She is on a steroid taper but it looks like she is been maintained 5 mg.  She is not wearing oxygen as much.  She feels much better about her situation.  Her weights are down.  She denies any chest pressure, neck or arm discomfort.  She is not had any new palpitations, presyncope or syncope.  She denies any PND or orthopnea.  The patient does not have symptoms concerning for COVID-19 infection (fever, chills, cough, or new shortness of breath).    Past Medical History:  Diagnosis Date  . Anemia   . Anxiety   . Arrhythmia    tachycardia  . Arthritis   . Chickenpox   . Depression   . Diverticulitis   . GERD (gastroesophageal reflux disease)   . Glaucoma   . Hyperlipidemia   . Hyperparathyroidism ([place])   . Hypertension   . Inflammatory polyps of colon ([place])   . LVH (left ventricular hypertrophy)   .  Lymphedema   . PCOS (polycystic ovarian syndrome)   . Prediabetes   . Recurrent UTI   . Sleep apnea    CPAP  . Thyroid disease   . Vitamin D deficiency    Past Surgical History:  Procedure Laterality Date  . BREAST BIOPSY  2015  . CESAREAN SECTION  2004  . INNER EAR SURGERY     ear and sinus surgery  . LAPAROSCOPIC REPAIR AND REMOVAL OF GASTRIC BAND    . OOPHORECTOMY Left   . TONSILLECTOMY AND ADENOIDECTOMY       Current Meds  Medication Sig  . acetaminophen (TYLENOL) 500 MG tablet Take 1,000 mg by mouth 3 (three) times daily as needed for moderate pain or headache.  Marland Kitchen atenolol (TENORMIN) 25 MG tablet Take 1 tablet (25 mg total) by mouth 2 (two) times daily.  . benzonatate  (TESSALON) 100 MG capsule Take 1 capsule (100 mg total) by mouth 3 (three) times daily as needed for cough.  . cetirizine (ZYRTEC ALLERGY) 10 MG tablet Take 1 tablet (10 mg total) by mouth daily.  . fluticasone (FLONASE) 50 MCG/ACT nasal spray Place 2 sprays into both nostrils daily.  . furosemide (LASIX) 40 MG tablet Take 1 tablet (40 mg total) by mouth 2 (two) times daily.  . hydrALAZINE (APRESOLINE) 50 MG tablet Take 2tab in morning , 1 @ 2pm and 2tabs at bedtime  . levothyroxine (SYNTHROID, LEVOTHROID) 75 MCG tablet TK 1 T PO QD  MONDAY-FRIDAY  . lisinopril (PRINIVIL,ZESTRIL) 20 MG tablet Take 1 tablet (20 mg total) by mouth daily. (Patient taking differently: Take 40 mg by mouth daily. )  . metFORMIN (GLUCOPHAGE) 500 MG tablet Take 1 tablet (500 mg total) by mouth daily with breakfast for 30 days.  . potassium chloride SA (K-DUR,KLOR-CON) 20 MEQ tablet Take 2 tablets (40 mEq total) by mouth daily.  . predniSONE (DELTASONE) 5 MG tablet Take 2 tablets (10 mg total) by mouth daily. (Patient taking differently: Take 5 mg by mouth daily. )  . Vitamin D, Ergocalciferol, (DRISDOL) 1.25 MG (50000 UT) CAPS capsule TAKE 1 CAPSULE BY MOUTH EVERY 7 DAYS  . [DISCONTINUED] esomeprazole (NEXIUM) 40 MG capsule TAKE 1 CAPSULE BY MOUTH TWICE DAILY     Allergies:   Fentanyl; Levofloxacin; Midazolam; Pollen extract; Atorvastatin; and Rosuvastatin   Social History   Tobacco Use  . Smoking status: Never Smoker  . Smokeless tobacco: Never Used  Substance Use Topics  . Alcohol use: Yes    Comment: social  . Drug use: No     Family Hx: The patient's family history includes Healthy in her son; Hearing loss in her son. She was adopted.  ROS:   Please see the history of present illness.    As stated in the HPI and negative for all other systems.   Prior CV studies:   The following studies were reviewed today:  Labs  Labs/Other Tests and Data Reviewed:    EKG:  No ECG reviewed.  Recent Labs:  02/20/2018: Pro B Natriuretic peptide (BNP) 173.0 05/10/2018: B Natriuretic Peptide 199.8 05/11/2018: ALT 39; TSH 0.938 05/13/2018: Hemoglobin 12.9; Magnesium 2.1; Platelets 253 07/08/2018: BUN 30; Creatinine, Ser 0.88; Potassium 3.9; Sodium 143   Recent Lipid Panel Lab Results  Component Value Date/Time   CHOL 196 02/27/2018 12:33 PM   TRIG 142 02/27/2018 12:33 PM   HDL 46 02/27/2018 12:33 PM   CHOLHDL 5 09/10/2017 11:56 AM   LDLCALC 122 (H) 02/27/2018 12:33 PM    Wt  Readings from Last 3 Encounters:  08/15/18 (!) 332 lb (150.6 kg)  07/17/18 (!) 330 lb (149.7 kg)  07/01/18 (!) 330 lb (149.7 kg)     Objective:    Vital Signs:  BP (!) 181/103   Pulse (!) 112   Wt (!) 332 lb (150.6 kg)   LMP 05/02/2017   BMI 60.72 kg/m    Well nourished, well developed female in no acute distress.   ASSESSMENT & PLAN:    HTN:   Her blood pressure still not at target.  We talked about the timing of her hydralazine.  I am going to switch her atenolol and stop this and start labetalol 200 mg 3 times a day.  Should keep a blood pressure diary for 2 weeks and share this with me.  I will make further changes based on this.  PULMONARY NODULES:    She and her pulmonologist have decided no biopsy.  She is being managed with steroids as above.   LVH:  PYP scan was negative.  It is likely that the LVH is related to the blood pressure.  EDEMA:   This seems to be stable.  No change in therapy.   CHRONIC DIASTOLIC HF:   She will continue with meds as listed.   OBESITY:     She is losing weight.  No change in therapy.  I encourage more of the same.   She is going to [facility].  CORONARY CALCIUM:  This was present on PET scanning.    She has no anginal symptoms.  I will consider stress testing in the future.   SLEEP APNEA:    She wears CPAP    CAROTID STENOSIS :   She has some mild stenosis noted when she was in [place].  It is been 2 years ago and she is due for follow-up carotid  Dopplers.  We will order these.    COVID-19 Education: The signs and symptoms of COVID-19 were discussed with the patient and how to seek care for testing (follow up with PCP or arrange E-visit).  The importance of social distancing was discussed today.  Time:   Today, I have spent 25 minutes with the patient with telehealth technology discussing the above problems.     Medication Adjustments/Labs and Tests Ordered: Current medicines are reviewed at length with the patient today.  Concerns regarding medicines are outlined above.   Tests Ordered: No orders of the defined types were placed in this encounter.   Medication Changes: No orders of the defined types were placed in this encounter.   Disposition:  Follow up 1 month  Signed, Minus Breeding, MD  08/15/2018 2:49 PM    [place]

## 2018-08-15 ENCOUNTER — Ambulatory Visit (INDEPENDENT_AMBULATORY_CARE_PROVIDER_SITE_OTHER): Payer: BLUE CROSS/BLUE SHIELD | Admitting: Gastroenterology

## 2018-08-15 ENCOUNTER — Telehealth (INDEPENDENT_AMBULATORY_CARE_PROVIDER_SITE_OTHER): Payer: BLUE CROSS/BLUE SHIELD | Admitting: Cardiology

## 2018-08-15 ENCOUNTER — Encounter: Payer: Self-pay | Admitting: Cardiology

## 2018-08-15 ENCOUNTER — Other Ambulatory Visit: Payer: Self-pay

## 2018-08-15 VITALS — BP 181/103 | HR 112 | Wt 332.0 lb

## 2018-08-15 DIAGNOSIS — R1310 Dysphagia, unspecified: Secondary | ICD-10-CM | POA: Diagnosis not present

## 2018-08-15 DIAGNOSIS — I5032 Chronic diastolic (congestive) heart failure: Secondary | ICD-10-CM | POA: Diagnosis not present

## 2018-08-15 DIAGNOSIS — I110 Hypertensive heart disease with heart failure: Secondary | ICD-10-CM

## 2018-08-15 DIAGNOSIS — Z7189 Other specified counseling: Secondary | ICD-10-CM | POA: Insufficient documentation

## 2018-08-15 DIAGNOSIS — K219 Gastro-esophageal reflux disease without esophagitis: Secondary | ICD-10-CM

## 2018-08-15 DIAGNOSIS — I517 Cardiomegaly: Secondary | ICD-10-CM

## 2018-08-15 DIAGNOSIS — K224 Dyskinesia of esophagus: Secondary | ICD-10-CM | POA: Diagnosis not present

## 2018-08-15 DIAGNOSIS — D369 Benign neoplasm, unspecified site: Secondary | ICD-10-CM | POA: Diagnosis not present

## 2018-08-15 DIAGNOSIS — I10 Essential (primary) hypertension: Secondary | ICD-10-CM

## 2018-08-15 DIAGNOSIS — Z86010 Personal history of colonic polyps: Secondary | ICD-10-CM

## 2018-08-15 MED ORDER — ESOMEPRAZOLE MAGNESIUM 40 MG PO CPDR: 40.0000 mg | DELAYED_RELEASE_CAPSULE | Freq: Two times a day (BID) | ORAL | 2 refills | Status: DC

## 2018-08-15 MED ORDER — LABETALOL HCL 200 MG PO TABS: 200.0000 mg | ORAL_TABLET | Freq: Three times a day (TID) | ORAL | 11 refills | Status: DC

## 2018-08-15 NOTE — Patient Instructions (Signed)
If you are age 53 or older, your body mass index should be between 23-30. Your There is no height or weight on file to calculate BMI. If this is out of the aforementioned range listed, please consider follow up with your Primary Care Provider.  If you are age 65 or younger, your body mass index should be between 19-25. Your There is no height or weight on file to calculate BMI. If this is out of the aformentioned range listed, please consider follow up with your Primary Care Provider.   We have sent the following medications to your pharmacy for you to pick up at your convenience: Nexium   It has been recommended to you by your physician that you have a(n) Colonoscopy/Endoscopy  At Hospital in 3-4 months completed.  We did not schedule the procedure(s) today due to Covid-19. Please contact our office at [phone] should you decide to have the procedure completed.   Thank you for choosing me and [facility].  Dr. Rush Landmark

## 2018-08-15 NOTE — Progress Notes (Signed)
[facility] VISIT   Primary Care Provider [name], Charlene Brooke, NP [address] [place] [address] [phone]  Patient Profile: Madison Palmer is a 53 y.o. 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  The patient presents to the [facility] for an evaluation and management of problem(s) noted below:  Problem List 1. Dysphagia, unspecified type   2. Esophageal dysmotilities   3. Gastroesophageal reflux disease, esophagitis presence not specified   4. Adenomatous polyp   5. History of colonic polyps     History of Present Illness: Please see prior consultation note for full details of HPI and summary of prior GI notation evaluation.  Interval History This is a scheduled follow-up.  A lot has happened since the patient initially presented in January for upper and lower endoscopy in the hospital-based setting however we had to defer this as she was about to undergo cardiology evaluation and stress testing.  Within a couple days she had progressive shortness of breath and ended up seeing her primary care provider and subsequently was directed to the emergency department where she was admitted to the hospital.  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  She has had quite a few months in regards to her overall medical status but is having a very positive attitude about things.  She is actually now at the point even though she is tapered on her steroids of being able to be off of oxygen.  That to her has been an amazing feet and she is awaiting her discussion with her  pulmonologist to let him know about how things are going.  Her weight has been slowly decreasing from when it had increased and she is very appreciative of the obesity management that she is having through [facility].  Her GI issues are stable.  She is still on her PPI therapy for her GERD.  She still has infrequent dysphagia symptoms but they are not rate limiting to her.  She has no odynophagia.  No significant abdominal pain currently.  Still has infrequent postprandial abdominal discomfort that can occur however.  GI Review of Systems Positive as above Negative for nausea, vomiting, change in bowel habits, melena, hematochezia   Review of Systems General: Denies fevers/chills Cardiovascular: Denies chest pain Pulmonary: Shortness of breath is improving weekly Gastroenterological: See HPI Genitourinary: Denies darkened urine Dermatological: No jaundice  Psychological: Mood is stable   Medications Current Outpatient Medications  Medication Sig Dispense Refill   acetaminophen (TYLENOL) 500 MG tablet Take 1,000 mg by mouth 3 (three) times daily as needed for moderate pain or headache.     benzonatate (TESSALON) 100 MG capsule Take 1 capsule (100 mg total) by mouth 3 (three) times daily as needed for cough. 20 capsule 0   cetirizine (ZYRTEC ALLERGY) 10 MG tablet Take 1 tablet (10 mg total) by mouth daily. 30 tablet 5   esomeprazole (NEXIUM) 40 MG capsule Take 1 capsule (40 mg total) by mouth 2 (two) times daily. 180 capsule 2   fluticasone (FLONASE) 50 MCG/ACT nasal spray Place 2 sprays into both nostrils daily.     furosemide (LASIX) 40 MG tablet Take 1 tablet (40 mg total) by mouth 2 (  two) times daily. 180 tablet 1   hydrALAZINE (APRESOLINE) 50 MG tablet Take 2tab in morning , 1 @ 2pm and 2tabs at bedtime 150 tablet 1   labetalol (NORMODYNE) 200 MG tablet Take 1 tablet (200 mg total) by mouth 3 (three) times daily. 90 tablet 11   levothyroxine (SYNTHROID, LEVOTHROID) 75 MCG  tablet TK 1 T PO QD  MONDAY-FRIDAY 20 tablet 0   lisinopril (PRINIVIL,ZESTRIL) 20 MG tablet Take 1 tablet (20 mg total) by mouth daily. (Patient taking differently: Take 40 mg by mouth daily. ) 90 tablet 3   metFORMIN (GLUCOPHAGE) 500 MG tablet Take 1 tablet (500 mg total) by mouth daily with breakfast for 30 days. 30 tablet 0   potassium chloride SA (K-DUR,KLOR-CON) 20 MEQ tablet Take 2 tablets (40 mEq total) by mouth daily. 180 tablet 2   predniSONE (DELTASONE) 5 MG tablet Take 1 tablet (5 mg total) by mouth daily. 60 tablet 3   Vitamin D, Ergocalciferol, (DRISDOL) 1.25 MG (50000 UT) CAPS capsule TAKE 1 CAPSULE BY MOUTH EVERY 7 DAYS 12 capsule 0   No current facility-administered medications for this visit.     Allergies Allergies  Allergen Reactions   Fentanyl Hives   Levofloxacin Hives   Midazolam Hives   Pollen Extract     seasonal   Atorvastatin     Muscle pain in legs    Rosuvastatin     Abdominal pain    Histories Past Medical History:  Diagnosis Date   Anemia    Anxiety    Arrhythmia    tachycardia   Arthritis    Chickenpox    Depression    Diverticulitis    GERD (gastroesophageal reflux disease)    Glaucoma    Hyperlipidemia    Hyperparathyroidism (HCC)    Hypertension    Inflammatory polyps of colon (HCC)    LVH (left ventricular hypertrophy)    Lymphedema    PCOS (polycystic ovarian syndrome)    Prediabetes    Recurrent UTI    Sleep apnea    CPAP   Thyroid disease    Vitamin D deficiency    Past Surgical History:  Procedure Laterality Date   BREAST BIOPSY  2015   CESAREAN SECTION  2004   INNER EAR SURGERY     ear and sinus surgery   LAPAROSCOPIC REPAIR AND REMOVAL OF GASTRIC BAND     OOPHORECTOMY Left    TONSILLECTOMY AND ADENOIDECTOMY     Social History   Socioeconomic History   Marital status: Married    Spouse name: Not on file   Number of children: 1   Years of education: Not on file    Highest education level: Not on file  Occupational History   Occupation: Unemployed  Scientist, product/process development strain: Not on file   Food insecurity:    Worry: Not on file    Inability: Not on file   Transportation needs:    Medical: Not on file    Non-medical: Not on file  Tobacco Use   Smoking status: Never Smoker   Smokeless tobacco: Never Used  Substance and Sexual Activity   Alcohol use: Yes    Comment: social   Drug use: No   Sexual activity: Not Currently  Lifestyle   Physical activity:    Days per week: Not on file    Minutes per session: Not on file   Stress: Not on file  Relationships   Social connections:    Talks  on phone: Not on file    Gets together: Not on file    Attends religious service: Not on file    Active member of club or organization: Not on file    Attends meetings of clubs or organizations: Not on file    Relationship status: Not on file   Intimate partner violence:    Fear of current or ex partner: Not on file    Emotionally abused: Not on file    Physically abused: Not on file    Forced sexual activity: Not on file  Other Topics Concern   Not on file  Social History Narrative   Lives with son and companion.     Family History  Adopted: Yes  Problem Relation Age of Onset   Hearing loss Son        right    Healthy Son    I have reviewed her medical, social, and family history in detail and updated the electronic medical record as necessary.    PHYSICAL EXAMINATION  Telehealth visit   REVIEW OF DATA  I reviewed the following data at the time of this encounter:  GI Procedures and Studies  No new relevant studies  Laboratory Studies  Reviewed in epic  Imaging Studies  No relevant studies   ASSESSMENT  Ms. Defalco is a 53 y.o. 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  The patient is seen today for evaluation and management of:  1. Dysphagia, unspecified type   2. Esophageal dysmotilities   3. Gastroesophageal reflux disease, esophagitis presence not specified   4. Adenomatous polyp   5. History of colonic polyps    Overall, the patient seems to be clinically and hemodynamically stable.  Overall she is very happy with her progressive improvement in her shortness of breath and oxygen requirements.  Her underlying diagnosis still remains in question but she may have an underlying etiology with the sarcoidosis diagnosis.  From the standpoint of GI related issues.  Her dysphagia and GERD seem to be stable.  She will require an endoscopy at some point in the coming months and now that her oxygen requirement is improving she will be in better states before we pursue that.  From the standpoint of her history of colon polyps with one colon polyp that was 10 mm in size she would have been due for a repeat colonoscopy and thus we will plan to pursue that at the same time as her endoscopy.  I want to rule out Barrett's esophagus or any other etiologies and plan an empiric dilation.  I will also take EOE biopsies.  If after empiric dilation and biopsies that returned tentatively negative then she may require a repeat manometry to see if things have transitioned from her previous testing.  I would like to pursue this in approximately 6 to 10 weeks due to the COVID-19 pandemic and her underlying medical comorbidities.  The risks and benefits of endoscopic evaluation were discussed with the patient; these include but are not limited to the risk of perforation, infection, bleeding, missed lesions, lack of diagnosis, severe illness requiring hospitalization, as well as anesthesia and sedation  related illnesses.  The patient is agreeable to proceed.  All patient questions were answered, to the  best of my ability, and the patient agrees to the aforementioned plan of action with follow-up as indicated.   PLAN  Continue PPI twice daily EGD/colonoscopy in hospital in 6 to 10 weeks due to COVID-19 pandemic Plan for empiric dilation even if no lesions are noted Hold on manometry until endoscopy has been performed Hold on solid food gastric emptying study for now   No orders of the defined types were placed in this encounter.    Modified Medications   Modified Medication Previous Medication   ESOMEPRAZOLE (NEXIUM) 40 MG CAPSULE esomeprazole (NEXIUM) 40 MG capsule      Take 1 capsule (40 mg total) by mouth 2 (two) times daily.    TAKE 1 CAPSULE BY MOUTH TWICE DAILY   PREDNISONE (DELTASONE) 5 MG TABLET predniSONE (DELTASONE) 5 MG tablet      Take 1 tablet (5 mg total) by mouth daily.    Take 2 tablets (10 mg total) by mouth daily.    Planned Follow Up: No follow-ups on file.   Justice Britain, MD [facility] Advanced Endoscopy Office # [phone]

## 2018-08-15 NOTE — Patient Instructions (Addendum)
Medication Instructions:  STOP- Atenolol START- Labetalol 200 mg three times a day  If you need a refill on your cardiac medications before your next appointment, please call your pharmacy.  Labwork: None Ordered   Testing/Procedures: None Ordered   Follow-Up: . Your physician recommends that you schedule a follow-up appointment in: May 22nd @ 1:20pm   At [facility], you and your health needs are our priority.  As part of our continuing mission to provide you with exceptional heart care, we have created designated Provider Care Teams.  These Care Teams include your primary Cardiologist (physician) and Advanced Practice Providers (APPs -  Physician Assistants and Nurse Practitioners) who all work together to provide you with the care you need, when you need it.  Thank you for choosing [facility] at [facility]!!

## 2018-08-15 NOTE — Addendum Note (Signed)
Addended by: Vennie Homans on: 08/15/2018 04:03 PM   Modules accepted: Orders

## 2018-08-16 ENCOUNTER — Telehealth: Payer: Self-pay | Admitting: Pulmonary Disease

## 2018-08-16 ENCOUNTER — Other Ambulatory Visit (INDEPENDENT_AMBULATORY_CARE_PROVIDER_SITE_OTHER): Payer: Self-pay | Admitting: Family Medicine

## 2018-08-16 DIAGNOSIS — E559 Vitamin D deficiency, unspecified: Secondary | ICD-10-CM

## 2018-08-16 MED ORDER — PREDNISONE 5 MG PO TABS: 5.0000 mg | ORAL_TABLET | Freq: Every day | ORAL | 3 refills | Status: DC

## 2018-08-16 NOTE — Telephone Encounter (Signed)
Called and spoke with pt to verify if she is still taking 10mg  prednisone or if she did decrease the dose down to 5mg  as per last OV with RA. Pt stated that she has been on 5mg  prednisone and is doing fine on the 5mg .  Pt is requesting to have CT performed ASAP so she can be able to get a dx. Last OV with RA stated: Again favor benign etiology. Pretest probability is less than 10%, will proceed with diagnostic testing and hopefully negative testing will be reassuring. 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 Pt has already contacted [facility] to see if she could possibly go there to get the CT performed and she stated that they told her if an order was placed by [place] stating that the CT was urgent, they could go ahead and get her in to have the CT performed.   Dr. Elsworth Soho, please advise on this for pt if you believe the CT should go ahead and be done now as pt is wanting it to be done ASAP or if pt should wait until June to have it performed. Thanks!

## 2018-08-16 NOTE — Telephone Encounter (Signed)
Called and spoke with pt relaying the info to her from RA. Pt expressed understanding. Nothing further needed.

## 2018-08-16 NOTE — Telephone Encounter (Signed)
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

## 2018-08-18 ENCOUNTER — Encounter: Payer: Self-pay | Admitting: Gastroenterology

## 2018-08-18 DIAGNOSIS — D369 Benign neoplasm, unspecified site: Secondary | ICD-10-CM | POA: Insufficient documentation

## 2018-08-19 ENCOUNTER — Encounter (INDEPENDENT_AMBULATORY_CARE_PROVIDER_SITE_OTHER): Payer: Self-pay | Admitting: Family Medicine

## 2018-08-19 ENCOUNTER — Encounter (HOSPITAL_COMMUNITY): Payer: Self-pay

## 2018-08-19 ENCOUNTER — Other Ambulatory Visit: Payer: Self-pay

## 2018-08-19 ENCOUNTER — Ambulatory Visit (INDEPENDENT_AMBULATORY_CARE_PROVIDER_SITE_OTHER): Payer: BLUE CROSS/BLUE SHIELD | Admitting: Family Medicine

## 2018-08-19 ENCOUNTER — Ambulatory Visit: Payer: Self-pay | Admitting: *Deleted

## 2018-08-19 ENCOUNTER — Ambulatory Visit: Payer: Self-pay

## 2018-08-19 ENCOUNTER — Emergency Department (HOSPITAL_COMMUNITY)
Admission: EM | Admit: 2018-08-19 | Discharge: 2018-08-21 | Disposition: A | Discharge status: Other Acute Inpatient Hospital | Payer: BLUE CROSS/BLUE SHIELD | Attending: Emergency Medicine | Admitting: Emergency Medicine

## 2018-08-19 DIAGNOSIS — E039 Hypothyroidism, unspecified: Secondary | ICD-10-CM | POA: Diagnosis not present

## 2018-08-19 DIAGNOSIS — F312 Bipolar disorder, current episode manic severe with psychotic features: Secondary | ICD-10-CM | POA: Diagnosis not present

## 2018-08-19 DIAGNOSIS — I10 Essential (primary) hypertension: Secondary | ICD-10-CM | POA: Insufficient documentation

## 2018-08-19 DIAGNOSIS — F3010 Manic episode without psychotic symptoms, unspecified: Secondary | ICD-10-CM

## 2018-08-19 DIAGNOSIS — Z7984 Long term (current) use of oral hypoglycemic drugs: Secondary | ICD-10-CM | POA: Insufficient documentation

## 2018-08-19 DIAGNOSIS — Z20828 Contact with and (suspected) exposure to other viral communicable diseases: Secondary | ICD-10-CM | POA: Insufficient documentation

## 2018-08-19 DIAGNOSIS — F309 Manic episode, unspecified: Secondary | ICD-10-CM

## 2018-08-19 DIAGNOSIS — Z79899 Other long term (current) drug therapy: Secondary | ICD-10-CM | POA: Insufficient documentation

## 2018-08-19 DIAGNOSIS — F3113 Bipolar disorder, current episode manic without psychotic features, severe: Secondary | ICD-10-CM | POA: Insufficient documentation

## 2018-08-19 LAB — ETHANOL: Alcohol, Ethyl (B): <10 mg/dL (ref <10)

## 2018-08-19 LAB — COMPREHENSIVE METABOLIC PANEL
ALT: 30 U/L (ref 0–44)
AST: 27 U/L (ref 15–41)
Albumin: 4.1 g/dL (ref 3.5–5.0)
Alkaline Phosphatase: 91 U/L (ref 38–126)
Anion gap: 11 (ref 5–15)
BUN: 20 mg/dL (ref 6–20)
CO2: 28 mmol/L (ref 22–32)
Calcium: 10.8 mg/dL — ABNORMAL HIGH (ref 8.9–10.3)
Chloride: 103 mmol/L (ref 98–111)
Creatinine, Ser: 0.83 mg/dL (ref 0.44–1.00)
GFR calc Af Amer: >60 mL/min (ref >60)
GFR calc non Af Amer: >60 mL/min (ref >60)
Glucose, Bld: 124 mg/dL — ABNORMAL HIGH (ref 70–99)
Potassium: 3.4 mmol/L — ABNORMAL LOW (ref 3.5–5.1)
Sodium: 142 mmol/L (ref 135–145)
Total Bilirubin: 1.2 mg/dL (ref 0.3–1.2)
Total Protein: 7.7 g/dL (ref 6.5–8.1)

## 2018-08-19 LAB — CBC
HCT: 50.2 % — ABNORMAL HIGH (ref 36.0–46.0)
Hemoglobin: 16.1 g/dL — ABNORMAL HIGH (ref 12.0–15.0)
MCH: 28.8 pg (ref 26.0–34.0)
MCHC: 32.1 g/dL (ref 30.0–36.0)
MCV: 89.8 fL (ref 80.0–100.0)
Platelets: 274 10*3/uL (ref 150–400)
RBC: 5.59 MIL/uL — ABNORMAL HIGH (ref 3.87–5.11)
RDW: 15.9 % — ABNORMAL HIGH (ref 11.5–15.5)
WBC: 9.9 10*3/uL (ref 4.0–10.5)
nRBC: 0 % (ref 0.0–0.2)

## 2018-08-19 LAB — SALICYLATE LEVEL: Salicylate Lvl: <7 mg/dL (ref 2.8–30.0)

## 2018-08-19 LAB — ACETAMINOPHEN LEVEL: Acetaminophen (Tylenol), Serum: <10 ug/mL — ABNORMAL LOW (ref 10–30)

## 2018-08-19 LAB — RAPID URINE DRUG SCREEN, HOSP PERFORMED
Amphetamines: NOT DETECTED
Barbiturates: NOT DETECTED
Benzodiazepines: NOT DETECTED
Cocaine: NOT DETECTED
Opiates: NOT DETECTED
Tetrahydrocannabinol: NOT DETECTED

## 2018-08-19 LAB — I-STAT BETA HCG BLOOD, ED (MC, WL, AP ONLY): I-stat hCG, quantitative: <5 m[IU]/mL (ref <5)

## 2018-08-19 MED ORDER — FUROSEMIDE 40 MG PO TABS
40.0000 mg | ORAL_TABLET | Freq: Two times a day (BID) | ORAL | Status: DC
  Administered 2018-08-19 – 2018-08-21 (×3): 40 mg via ORAL
  Filled 2018-08-19 (×4): qty 1

## 2018-08-19 MED ORDER — HALOPERIDOL LACTATE 5 MG/ML IJ SOLN
5.0000 mg | Freq: Once | INTRAMUSCULAR | Status: AC
  Administered 2018-08-19: 5 mg via INTRAMUSCULAR
  Filled 2018-08-19: qty 1

## 2018-08-19 MED ORDER — DIPHENHYDRAMINE HCL 50 MG/ML IJ SOLN
25.0000 mg | Freq: Once | INTRAMUSCULAR | Status: AC
  Administered 2018-08-19: 23:00:00 25 mg via INTRAMUSCULAR
  Filled 2018-08-19: qty 1

## 2018-08-19 MED ORDER — DIPHENHYDRAMINE HCL 50 MG/ML IJ SOLN: 25.0000 mg | Freq: Once | INTRAMUSCULAR | Status: DC

## 2018-08-19 MED ORDER — LABETALOL HCL 200 MG PO TABS
200.0000 mg | ORAL_TABLET | Freq: Three times a day (TID) | ORAL | Status: DC
  Administered 2018-08-19 – 2018-08-21 (×5): 200 mg via ORAL
  Filled 2018-08-19 (×6): qty 1

## 2018-08-19 MED ORDER — POTASSIUM CHLORIDE CRYS ER 20 MEQ PO TBCR
40.0000 meq | EXTENDED_RELEASE_TABLET | Freq: Every day | ORAL | Status: DC
  Administered 2018-08-19 – 2018-08-21 (×3): 40 meq via ORAL
  Filled 2018-08-19 (×3): qty 2

## 2018-08-19 MED ORDER — LISINOPRIL 20 MG PO TABS
40.0000 mg | ORAL_TABLET | Freq: Every day | ORAL | Status: DC
  Administered 2018-08-19 – 2018-08-21 (×3): 40 mg via ORAL
  Filled 2018-08-19 (×3): qty 2

## 2018-08-19 MED ORDER — PANTOPRAZOLE SODIUM 40 MG PO TBEC
40.0000 mg | DELAYED_RELEASE_TABLET | Freq: Every day | ORAL | Status: DC
  Administered 2018-08-19 – 2018-08-21 (×3): 40 mg via ORAL
  Filled 2018-08-19: qty 1
  Filled 2018-08-19: qty 2

## 2018-08-19 MED ORDER — METFORMIN HCL 500 MG PO TABS
500.0000 mg | ORAL_TABLET | Freq: Every day | ORAL | Status: DC
  Administered 2018-08-20 – 2018-08-21 (×2): 500 mg via ORAL
  Filled 2018-08-19 (×3): qty 1

## 2018-08-19 MED ORDER — LEVOTHYROXINE SODIUM 75 MCG PO TABS
75.0000 ug | ORAL_TABLET | Freq: Every day | ORAL | Status: DC
  Administered 2018-08-20 – 2018-08-21 (×2): 75 ug via ORAL
  Filled 2018-08-19 (×3): qty 1

## 2018-08-19 MED ORDER — HYDRALAZINE HCL 50 MG PO TABS
50.0000 mg | ORAL_TABLET | Freq: Three times a day (TID) | ORAL | Status: DC
  Filled 2018-08-19 (×2): qty 1

## 2018-08-19 MED ORDER — PREDNISONE 5 MG PO TABS
5.0000 mg | ORAL_TABLET | Freq: Every day | ORAL | Status: DC
  Administered 2018-08-19 – 2018-08-21 (×3): 5 mg via ORAL
  Filled 2018-08-19 (×3): qty 1

## 2018-08-19 NOTE — Telephone Encounter (Signed)
Called her via cell phone today. She was talking in tangential statements. She kept talking about Dr. Adair Patter, Bridgette Habermann and Tyron. She identified Terrance as her significant other. She kept saying she is not manic and not crazy. While talking a female identified himself as Catering manager. EMS personnel tried to redirect and encourage to go to ED for further evaluation. She then stated she needed to go to bathroom and EMS personnel agreed to escort her.The phone went silent.

## 2018-08-19 NOTE — ED Notes (Signed)
Pt has purse and cell phone in patient belonging bag in cabinet across from room 18 behind nurse desk.

## 2018-08-19 NOTE — ED Provider Notes (Signed)
[facility] DEPT Provider Note   CSN: [id number] Arrival date & time: 08/19/18  1824    History   Chief Complaint Chief Complaint  Patient presents with  . IVC    HPI Madison Palmer is a 53 y.o. female.     Patient is a 53 year old female with a history of morbid obesity, hyperlipidemia, hypertension, bipolar disease, CHF who is presenting today with GPD under IVC commitment.  Family reports that patient has a history of bipolar disease and has not been taking medications for some time.  Over the last few days patient has been become violent, disorganized, manic.  She is talking constantly and will not sleep.  She is also not been taking any of her other medications either.  She did see a psychiatrist in [place] in the past but has not seen anybody recently.  Patient does not give much history.  She just keeps talking about taking prednisone and not needing oxygen and needing to get out of the hospital as fast as she can.  She says she needs to see someone about not being able to stop talking but then jumps to another subject immediately.  The history is provided by the patient and a relative.    Past Medical History:  Diagnosis Date  . Anemia   . Anxiety   . Arrhythmia    tachycardia  . Arthritis   . Chickenpox   . Depression   . Diverticulitis   . GERD (gastroesophageal reflux disease)   . Glaucoma   . Hyperlipidemia   . Hyperparathyroidism ([place])   . Hypertension   . Inflammatory polyps of colon ([place])   . LVH (left ventricular hypertrophy)   . Lymphedema   . PCOS (polycystic ovarian syndrome)   . Prediabetes   . Recurrent UTI   . Sleep apnea    CPAP  . Thyroid disease   . Vitamin D deficiency     Patient Active Problem List   Diagnosis Date Noted  . Adenomatous polyp 08/18/2018  . Educated About Covid-19 Virus Infection 08/15/2018  . Pulmonary vascular congestion 06/24/2018  . Chronic diastolic heart failure ([place]) 05/15/2018   . Dyspnea 05/15/2018  . Pulmonary nodules 05/11/2018  . Hypertensive urgency 05/11/2018  . Chronic respiratory failure with hypoxia ([place]) 05/10/2018  . 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 DDD (degenerative disc disease), lumbar 03/21/2018  . Primary osteoarthritis of both knees 03/21/2018  . Dysphagia 03/19/2018  . PVC (premature ventricular contraction) 03/12/2018  . LVH (left ventricular hypertrophy) 03/12/2018  . Generalized edema 03/12/2018  . Bilateral carotid artery stenosis 03/12/2018  . Weight gain 01/25/2018  . Hemorrhoids 01/05/2018  . Generalized postprandial abdominal pain 01/05/2018  . Hiatal hernia 01/05/2018  . Esophageal dysmotilities 01/05/2018  . Mixed hyperlipidemia 09/12/2017  . Multinodular goiter 07/27/2017  . Hyperparathyroidism ([place]) 06/13/2017  . Vitamin D deficiency 06/13/2017  . History of colonic polyps 05/15/2017  . Eustachian tube dysfunction, right 05/15/2017  . Lymphatic edema 05/15/2017  . Hypertension 05/14/2017  . Anemia 05/11/2017  . Arrhythmia 05/11/2017  . Diverticulosis 05/11/2017  . GERD (gastroesophageal reflux disease) 05/11/2017  . Hypothyroidism 05/11/2017  . Lower extremity edema 05/11/2017  . Morbid obesity ([place]) 05/11/2017  . Polycystic ovary syndrome 05/11/2017  . OSA on CPAP 05/11/2017  . Neck pain 05/11/2017  . Allergic rhinitis 05/11/2017  . 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 BREAST BIOPSY  2015  . CESAREAN SECTION  2004  . INNER EAR SURGERY     ear and sinus surgery  . LAPAROSCOPIC REPAIR AND REMOVAL OF GASTRIC BAND    . OOPHORECTOMY Left   . TONSILLECTOMY AND ADENOIDECTOMY       OB History    Gravida  1   Para  1   Term      Preterm      AB      Living  1     SAB      TAB      Ectopic      Multiple      Live Births               Home Medications    Prior to Admission  medications   Medication Sig Start Date End Date Taking? Authorizing Provider  acetaminophen (TYLENOL) 500 MG tablet Take 1,000 mg by mouth 3 (three) times daily as needed for moderate pain or headache.    [provider]  benzonatate (TESSALON) 100 MG capsule Take 1 capsule (100 mg total) by mouth 3 (three) times daily as needed for cough. 05/21/18   [name], Charlene Brooke, NP  cetirizine (ZYRTEC ALLERGY) 10 MG tablet Take 1 tablet (10 mg total) by mouth daily. 03/13/18   [name], Charlene Brooke, NP  esomeprazole (NEXIUM) 40 MG capsule Take 1 capsule (40 mg total) by mouth 2 (two) times daily. 08/15/18   Mansouraty, Telford Nab., MD  fluticasone (FLONASE) 50 MCG/ACT nasal spray Place 2 sprays into both nostrils daily.    [provider]  furosemide (LASIX) 40 MG tablet Take 1 tablet (40 mg total) by mouth 2 (two) times daily. 06/24/18   Minus Breeding, MD  hydrALAZINE (APRESOLINE) 50 MG tablet Take 2tab in morning , 1 @ 2pm and 2tabs at bedtime 07/19/18   [name], Charlene Brooke, NP  labetalol (NORMODYNE) 200 MG tablet Take 1 tablet (200 mg total) by mouth 3 (three) times daily. 08/15/18   Minus Breeding, MD  levothyroxine (SYNTHROID, LEVOTHROID) 75 MCG tablet TK 1 T PO QD  MONDAY-FRIDAY 07/16/18   Renato Shin, MD  lisinopril (PRINIVIL,ZESTRIL) 20 MG tablet Take 1 tablet (20 mg total) by mouth daily. Patient taking differently: Take 40 mg by mouth daily.  06/24/18   Minus Breeding, MD  metFORMIN (GLUCOPHAGE) 500 MG tablet Take 1 tablet (500 mg total) by mouth daily with breakfast for 30 days. 07/18/18 08/17/18  Eber Jones, MD  potassium chloride SA (K-DUR,KLOR-CON) 20 MEQ tablet Take 2 tablets (40 mEq total) by mouth daily. 06/24/18   Minus Breeding, MD  predniSONE (DELTASONE) 5 MG tablet Take 1 tablet (5 mg total) by mouth daily. 08/16/18   Rigoberto Noel, MD  Vitamin D, Ergocalciferol, (DRISDOL) 1.25 MG (50000 UT) CAPS capsule TAKE 1 CAPSULE BY MOUTH EVERY 7 DAYS 05/30/18   Eber Jones, MD    Family History Family History  Adopted: Yes  Problem Relation Age of Onset  . Hearing loss Son        right   . Healthy Son     Social History Social History   Tobacco Use  . Smoking status: Never Smoker  . Smokeless tobacco: Never Used  Substance Use Topics  . Alcohol use: Yes    Comment: social  . Drug use: No     Allergies   Fentanyl; Levofloxacin; Midazolam; Pollen extract; Atorvastatin; and Rosuvastatin   Review of Systems Review of Systems  Unable to perform  ROS: Psychiatric disorder     Physical Exam Updated Vital Signs BP (!) 176/95 (BP Location: Left Arm)   Pulse (!) 109   Temp 98.7 F (37.1 C) (Oral)   Resp 16   LMP 05/02/2017   SpO2 98%   Physical Exam Vitals signs and nursing note reviewed.  Constitutional:      Appearance: She is well-developed. She is obese.     Comments: Labile and angry  HENT:     Head: Normocephalic and atraumatic.  Eyes:     Pupils: Pupils are equal, round, and reactive to light.  Cardiovascular:     Rate and Rhythm: Regular rhythm. Tachycardia present.     Heart sounds: Normal heart sounds. No murmur. No friction rub.  Pulmonary:     Effort: Pulmonary effort is normal.     Breath sounds: Normal breath sounds. No wheezing or rales.  Abdominal:     General: Bowel sounds are normal. There is no distension.     Palpations: Abdomen is soft.     Tenderness: There is no abdominal tenderness. There is no guarding or rebound.  Musculoskeletal: Normal range of motion.        General: No tenderness.     Comments: No edema  Skin:    General: Skin is warm and dry.     Findings: No rash.  Neurological:     Mental Status: She is alert and oriented to person, place, and time. Mental status is at baseline.     Cranial Nerves: No cranial nerve deficit.  Psychiatric:        Attention and Perception: She is inattentive.        Mood and Affect: Affect is labile.        Speech: Speech is rapid and pressured.         Behavior: Behavior is agitated.        Thought Content: Thought content is paranoid. Thought content does not include homicidal or suicidal ideation.      ED Treatments / Results  Labs (all labs ordered are listed, but only abnormal results are displayed) Labs Reviewed  COMPREHENSIVE METABOLIC PANEL - Abnormal; Notable for the following components:      Result Value   Potassium 3.4 (*)    Glucose, Bld 124 (*)    Calcium 10.8 (*)    All other components within normal limits  ACETAMINOPHEN LEVEL - Abnormal; Notable for the following components:   Acetaminophen (Tylenol), Serum <10 (*)    All other components within normal limits  CBC - Abnormal; Notable for the following components:   RBC 5.59 (*)    Hemoglobin 16.1 (*)    HCT 50.2 (*)    RDW 15.9 (*)    All other components within normal limits  ETHANOL  SALICYLATE LEVEL  RAPID URINE DRUG SCREEN, HOSP PERFORMED  I-STAT BETA HCG BLOOD, ED ([facility], [facility], [facility] ONLY)    EKG None  Radiology No results found.  Procedures Procedures (including critical care time)  Medications Ordered in ED Medications  pantoprazole (PROTONIX) EC tablet 40 mg (40 mg Oral Given 08/19/18 2001)  furosemide (LASIX) tablet 40 mg (40 mg Oral Given 08/19/18 2003)  hydrALAZINE (APRESOLINE) tablet 50 mg (has no administration in time range)  labetalol (NORMODYNE) tablet 200 mg (has no administration in time range)  levothyroxine (SYNTHROID) tablet 75 mcg (has no administration in time range)  lisinopril (ZESTRIL) tablet 40 mg (40 mg Oral Given 08/19/18 2013)  metFORMIN (GLUCOPHAGE) tablet 500 mg (  has no administration in time range)  potassium chloride SA (K-DUR) CR tablet 40 mEq (40 mEq Oral Given 08/19/18 2001)  predniSONE (DELTASONE) tablet 5 mg (5 mg Oral Given 08/19/18 2002)  haloperidol lactate (HALDOL) injection 5 mg (5 mg Intramuscular Given 08/19/18 1952)     Initial Impression / Assessment and Plan / ED Course  I have reviewed the triage  vital signs and the nursing notes.  Pertinent labs & imaging results that were available during my care of the patient were reviewed by me and considered in my medical decision making (see chart for details).        Patient presenting today under IVC commitment taken out by family for manic behavior.  Patient has been off of her medications for bipolar disease and is starting to become violent and aggressive with family members.  Here patient initially was uncooperative and aggressive.  She received IM Haldol with improvement in more directability.  She was unable to give any useful history as she was very tangential and would not talk about anything specific for very long.  She states she was not sleeping.  No reported drug use or excessive alcohol use.  Labs are reassuring.  Patient was mildly tachycardic and hypertensive here but has been off of her medications.  Patient's home medications ordered.  TTS to evaluate.  Currently she is medically clear.  Final Clinical Impressions(s) / ED Diagnoses   Final diagnoses:  Manic behavior [facility])    ED Discharge Orders    None       Blanchie Dessert, MD 08/19/18 2130

## 2018-08-19 NOTE — Telephone Encounter (Signed)
Called the patient, she pick up the phone but unable to understand what the pt is saying, seem very confuse and nonstop talking.

## 2018-08-19 NOTE — Telephone Encounter (Signed)
Pt was suppose to have a virtual visit with Dr. Adair Patter at [facility] clinic. Pt refused to have virtual visit but did speak with the provider over the phone. Per her chart the patient was speaking tangential statements and also statements that did not make sense. Per the TE, she had stopped taking her medications and reported that a nurse practioner  was coming to the house to check her b/p cause he was unemployed.  Before her appoints had been lucid and appropriate.  Caryl Pina, staff in the office called her friend that lives with her to call 911 and then the office call the police department to do a welfare check on her.  I called the friend who stated that the patient was irate that with everyone. She refused to go to the hospital. And since she had answered their questions correctly could not take her. He stated the the patient had stopped taking her medication, stopped taking baths, was refusing to sleep. Up all night and calling people at all ours of the night. She had been going to behavior health in [place] but stopped taking he medications there. He does not know what happen there because he was not there.  Attempted to call pt, no answer and mail box is full. Called the friend back and no answer, left message that I called. Routing to flow at [facility] for review and recommendation.
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
## 2018-08-19 NOTE — ED Triage Notes (Signed)
Patient with history of bipolar- patient is in denial that she has bipolar and refuses to take her medications. Patient labile in triage- laughing, then sobbing, then paranoid, then screaming. Patient Using pressured speech and attempting to refuse all treatment. Patient does also have heart history and is hypertensive and tachycardic in triage. IVC taken out on patient by roommate/friend, stating patient behavior is unsafe and belligerent. Patient arrived with [employer] escort.

## 2018-08-19 NOTE — ED Notes (Signed)
Did not administer hydralazine HCL 50 mg per provider.

## 2018-08-19 NOTE — ED Notes (Signed)
RN has patients red flower cane at nurse desk.

## 2018-08-19 NOTE — ED Notes (Signed)
Patient is not cooperating with putting on purple scrubs, patient is in a manic phase, and continual flight of ideas. Patient is not making any sense, and wants to leave. Sitter orders in place, [employer] at bedside.

## 2018-08-19 NOTE — ED Notes (Signed)
ED Provider at bedside. 

## 2018-08-19 NOTE — ED Notes (Signed)
No other belongings noted

## 2018-08-19 NOTE — Telephone Encounter (Signed)
A gentleman stating that he was the patient husband called asking me to call his wife she is talking out of her mind need me to call her. She has not taken her medication correctly and her BP is 240/119. A second call was placed to patient who is speaking to me about a lot of different things. She was very disconnected in her thoughts. She continued to talk about she was not the problem and that [name] wanted her to see anther doctor for her problem.  I tried to bring the BP issue up to patient and she just kept asking me if [name] had ever recorded a systolic on her. Pt is driving. A dog started to bark. Pt states that she had to bring her dog with her. It sounds as though she is now in a parked car. She blew her horn for someone to come. She continued to be very agitated. I tried to ask where she was and she hung up the phone.  911 operator was called to check patient.  Anther call came in from patient husband who states that the patient is not at home He states a neighbor had follow the patient. Mr Madison Palmer is aware that 911 was dispatched to the patient home.

## 2018-08-19 NOTE — ED Notes (Signed)
Bed: FP82 Expected date:  Expected time:  Means of arrival:  Comments: EMS Elderly Female IVC

## 2018-08-19 NOTE — Progress Notes (Signed)
Spoke with patient this morning via telephone call only as she refused virtual visit.  Upon picking up patient began speaking in tangential statements that she connected using and "and you know I know the real answer".  She said she stopped taking most of her medication but could not tell me what medication she was taking except for lisinopril 40mg .  She stated her medication was recently changed but that she knew the truth about what medication she needed and so had not made the change of medication.  She also reported that she has a Tour manager coming to her house to check her blood pressure but he was only doing this because she states he is unemployed.  She reported she had not slept in days. Her thought process and speak was both tangential and nonsensical.  Previously in all her appointments she has been lucid and appropriate.  My LPN called her emergency contact who endorsed patient has been irate, minimally sleeping and not bathing for a better part of two weeks.  Patient's son is at home with her.  After ending the phone call LPN spoke with emergency contact and encouraged him to call 911 to get assistance for Madison Palmer.  I then called the [facility] [employer] to express my concerns and ask for a wellfare check but was told that EMS had already been dispatched to her residence.

## 2018-08-19 NOTE — ED Notes (Signed)
Patient continues to have flight of ideas, hallucinations and delusions. Patient talking to herself in loud voice in her room, requesting to speak to her son or her dog, Angie Fava.

## 2018-08-19 NOTE — ED Notes (Signed)
1 bag of belongings placed in cabinet labeled 16-18 nurse's station-clothing in bag-patient kept glasses on

## 2018-08-20 ENCOUNTER — Emergency Department (HOSPITAL_COMMUNITY): Payer: BLUE CROSS/BLUE SHIELD

## 2018-08-20 ENCOUNTER — Other Ambulatory Visit: Payer: Self-pay

## 2018-08-20 DIAGNOSIS — F312 Bipolar disorder, current episode manic severe with psychotic features: Secondary | ICD-10-CM

## 2018-08-20 LAB — CBG MONITORING, ED: Glucose-Capillary: 168 mg/dL — ABNORMAL HIGH (ref 70–99)

## 2018-08-20 LAB — SARS CORONAVIRUS 2 BY RT PCR (HOSPITAL ORDER, PERFORMED IN ~~LOC~~ HOSPITAL LAB): SARS Coronavirus 2: NEGATIVE

## 2018-08-20 MED ORDER — ZIPRASIDONE MESYLATE 20 MG IM SOLR
10.0000 mg | Freq: Once | INTRAMUSCULAR | Status: AC
  Administered 2018-08-20: 10 mg via INTRAMUSCULAR
  Filled 2018-08-20: qty 20

## 2018-08-20 MED ORDER — HYDROXYZINE HCL 25 MG PO TABS
25.0000 mg | ORAL_TABLET | Freq: Once | ORAL | Status: AC
  Administered 2018-08-20: 25 mg via ORAL
  Filled 2018-08-20: qty 1

## 2018-08-20 MED ORDER — RISPERIDONE 1 MG PO TABS
1.0000 mg | ORAL_TABLET | Freq: Two times a day (BID) | ORAL | Status: DC
  Administered 2018-08-20 – 2018-08-21 (×3): 1 mg via ORAL
  Filled 2018-08-20 (×3): qty 1

## 2018-08-20 MED ORDER — STERILE WATER FOR INJECTION IJ SOLN
INTRAMUSCULAR | Status: AC
  Administered 2018-08-20: 22:00:00 1.2 mL
  Filled 2018-08-20: qty 10

## 2018-08-20 NOTE — ED Notes (Signed)
Pt went to sleep and began snoring at 12:45 AM. O2 sat would sporadically drop in 80s from 90-95% so put her on 2 L O2. After applying oxygen, pt O2 sat at 96-98%.

## 2018-08-20 NOTE — BH Assessment (Signed)
[facility] Assessment Progress Note  At 16:20 [name] calls from [facility] to report that pt has been tentatively accepted to their facility by Dr Caren Hazy to Rm 158-1.  However, pt's blood pressure has to be stable, and routine ED Covid-19 screening must be faxed to them at [phone].  EDP Gareth Morgan, MD concurs with this decision.  Pt's nurse, Morey Hummingbird, has been notified, and agrees to the conditions noted above, and will call report to [phone].  Pt is to be transported via [facility].  [facility] stipulates that pt must arrive either before 23:00 tonight, or after 06:00 tomorrow.  [facility] Coordinator [phone]

## 2018-08-20 NOTE — ED Notes (Addendum)
Pt talking loud, disturbing other patients. Continues to pray constantly, HR 120.  Dr Laverta Baltimore notified.

## 2018-08-20 NOTE — ED Notes (Signed)
Bed: WA29 Expected date:  Expected time:  Means of arrival:  Comments: 

## 2018-08-20 NOTE — ED Notes (Signed)
Per

## 2018-08-20 NOTE — BH Assessment (Addendum)
[facility] Assessment Progress Note  Per Buford Dresser, DO, this pt requires psychiatric hospitalization at this time.  Pt presents under IVC initiated by a friend of the pt, which Dr Mariea Clonts has upheld.  The following facilities have been contacted to seek placement for this pt, with results as noted:  Beds available, information sent, decision pending:  [place]:  Jinny Sanders (due to medical acuity)   Jalene Mullet, [place] Coordinator [phone]

## 2018-08-20 NOTE — Consult Note (Addendum)
[facility] Face-to-Face Psychiatry Consult   Reason for Consult:  Psychosis  Referring Physician:  EDP  Patient Identification: Madison Palmer MRN:  [id number] Principal Diagnosis: Bipolar I disorder, current or most recent episode manic, with psychotic features ([place]) Diagnosis:  Principal Problem:   Bipolar I disorder, current or most recent episode manic, with psychotic features ([place])   Total Time spent with patient: 30 minutes  Subjective:   Madison Palmer is a 53 y.o. female patient admitted with psychosis.  HPI:   Per chart review, patient was admitted with psychosis. She has been refusing her psychotropic medications. She has been exhibiting paranoid and erratic behaviors. She was placed under IVC by her roommate/friend. On interview, she is disorganized and nonsensical in speech. She reports, "I want to take a break so I can be in the state that I was yesterday." She is tangential and it is difficult to obtain information from her.   Past Psychiatric History: Bipolar disorder   Risk to Self: Suicidal Ideation: No Suicidal Intent: No Is patient at risk for suicide?: No Suicidal Plan?: No Access to Means: No What has been your use of drugs/alcohol within the last 12 months?: None How many times?: 0 Other Self Harm Risks: None Triggers for Past Attempts: None known Intentional Self Injurious Behavior: None Risk to Others: Homicidal Ideation: No Thoughts of Harm to Others: No Current Homicidal Intent: No Current Homicidal Plan: No Access to Homicidal Means: No Identified Victim: No one History of harm to others?: No Assessment of Violence: None Noted Violent Behavior Description: Pt denies Does patient have access to weapons?: Yes (Comment)(Pt says her son has guns in the home.  Secured.) Criminal Charges Pending?: No Does patient have a court date: No Prior Inpatient Therapy: Prior Inpatient Therapy: No Prior Therapy Dates: None Prior Therapy Facilty/Provider(s):  None Reason for Treatment: None Prior Outpatient Therapy: Prior Outpatient Therapy: Yes Prior Therapy Dates: Back in 2015 Prior Therapy Facilty/Provider(s): a LCSW in [place] Reason for Treatment: therapy Does patient have an ACCT team?: No Does patient have Intensive In-House Services?  : No Does patient have [facility] services? : No Does patient have P4CC services?: No  Past Medical History:  Past Medical History:  Diagnosis Date  . Anemia   . Anxiety   . Arrhythmia    tachycardia  . Arthritis   . Chickenpox   . Depression   . Diverticulitis   . GERD (gastroesophageal reflux disease)   . Glaucoma   . Hyperlipidemia   . Hyperparathyroidism ([place])   . Hypertension   . Inflammatory polyps of colon ([place])   . LVH (left ventricular hypertrophy)   . Lymphedema   . PCOS (polycystic ovarian syndrome)   . Prediabetes   . Recurrent UTI   . Sleep apnea    CPAP  . Thyroid disease   . Vitamin D deficiency     Past Surgical History:  Procedure Laterality Date  . BREAST BIOPSY  2015  . CESAREAN SECTION  2004  . INNER EAR SURGERY     ear and sinus surgery  . LAPAROSCOPIC REPAIR AND REMOVAL OF GASTRIC BAND    . OOPHORECTOMY Left   . TONSILLECTOMY AND ADENOIDECTOMY     Family History:  Family History  Adopted: Yes  Problem Relation Age of Onset  . Hearing loss Son        right   . Healthy Son    Family Psychiatric  History: None per chart review.  Social History:  Social History  Substance and Sexual Activity  Alcohol Use Yes   Comment: social     Social History   Substance and Sexual Activity  Drug Use No    Social History   Socioeconomic History  . Marital status: Married    Spouse name: Not on file  . Number of children: 1  . Years of education: Not on file  . Highest education level: Not on file  Occupational History  . Occupation: Unemployed  Social Needs  . Financial resource strain: Not on file  . Food insecurity:    Worry: Not on file     Inability: Not on file  . Transportation needs:    Medical: Not on file    Non-medical: Not on file  Tobacco Use  . Smoking status: Never Smoker  . Smokeless tobacco: Never Used  Substance and Sexual Activity  . Alcohol use: Yes    Comment: social  . Drug use: No  . Sexual activity: Not Currently  Lifestyle  . Physical activity:    Days per week: Not on file    Minutes per session: Not on file  . Stress: Not on file  Relationships  . Social connections:    Talks on phone: Not on file    Gets together: Not on file    Attends religious service: Not on file    Active member of club or organization: Not on file    Attends meetings of clubs or organizations: Not on file    Relationship status: Not on file  Other Topics Concern  . Not on file  Social History Narrative   Lives with son and companion.     Additional Social History: She lives at home with her son and significant other.     Allergies:   Allergies  Allergen Reactions  . Fentanyl Hives  . Levofloxacin Hives  . Midazolam Hives  . Pollen Extract     seasonal  . Atorvastatin     Muscle pain in legs   . Doxycycline Itching and Swelling  . Hydralazine Hcl Other (See Comments)    Hypercalcemia   . Rosuvastatin     Abdominal pain    Labs:  Results for orders placed or performed during the hospital encounter of 08/19/18 (from the past 48 hour(s))  Comprehensive metabolic panel     Status: Abnormal   Collection Time: 08/19/18  8:19 PM  Result Value Ref Range   Sodium 142 135 - 145 mmol/L   Potassium 3.4 (L) 3.5 - 5.1 mmol/L   Chloride 103 98 - 111 mmol/L   CO2 28 22 - 32 mmol/L   Glucose, Bld 124 (H) 70 - 99 mg/dL   BUN 20 6 - 20 mg/dL   Creatinine, Ser 0.83 0.44 - 1.00 mg/dL   Calcium 10.8 (H) 8.9 - 10.3 mg/dL   Total Protein 7.7 6.5 - 8.1 g/dL   Albumin 4.1 3.5 - 5.0 g/dL   AST 27 15 - 41 U/L   ALT 30 0 - 44 U/L   Alkaline Phosphatase 91 38 - 126 U/L   Total Bilirubin 1.2 0.3 - 1.2 mg/dL   GFR calc  non Af Amer >60 >60 mL/min   GFR calc Af Amer >60 >60 mL/min   Anion gap 11 5 - 15    Comment: Performed at [facility], [place] [address] [place], [place] [address]  Ethanol     Status: None   Collection Time: 08/19/18  8:19 PM  Result Value Ref  Range   Alcohol, Ethyl (B) <10 <10 mg/dL    Comment: (NOTE) Lowest detectable limit for serum alcohol is 10 mg/dL. For medical purposes only. Performed at [facility], [place] [address] [place], [place] [address]   Salicylate level     Status: None   Collection Time: 08/19/18  8:19 PM  Result Value Ref Range   Salicylate Lvl <9.4 2.8 - 30.0 mg/dL    Comment: Performed at [facility], [place] [address], [place], [place] [address]  Acetaminophen level     Status: Abnormal   Collection Time: 08/19/18  8:19 PM  Result Value Ref Range   Acetaminophen (Tylenol), Serum <10 (L) 10 - 30 ug/mL    Comment: (NOTE) Therapeutic concentrations vary significantly. A range of 10-30 ug/mL  may be an effective concentration for many patients. However, some  are best treated at concentrations outside of this range. Acetaminophen concentrations >150 ug/mL at 4 hours after ingestion  and >50 ug/mL at 12 hours after ingestion are often associated with  toxic reactions. Performed at [facility], [place] [address] [place], [place] [address]   cbc     Status: Abnormal   Collection Time: 08/19/18  8:19 PM  Result Value Ref Range   WBC 9.9 4.0 - 10.5 K/uL   RBC 5.59 (H) 3.87 - 5.11 MIL/uL   Hemoglobin 16.1 (H) 12.0 - 15.0 g/dL   HCT 50.2 (H) 36.0 - 46.0 %   MCV 89.8 80.0 - 100.0 fL   MCH 28.8 26.0 - 34.0 pg   MCHC 32.1 30.0 - 36.0 g/dL   RDW 15.9 (H) 11.5 - 15.5 %   Platelets 274 150 - 400 K/uL   nRBC 0.0 0.0 - 0.2 %    Comment: Performed at [facility], [place] [address], [place], [place] [address]  Rapid urine drug screen (hospital performed)     Status: None    Collection Time: 08/19/18  8:19 PM  Result Value Ref Range   Opiates NONE DETECTED NONE DETECTED   Cocaine NONE DETECTED NONE DETECTED   Benzodiazepines NONE DETECTED NONE DETECTED   Amphetamines NONE DETECTED NONE DETECTED   Tetrahydrocannabinol NONE DETECTED NONE DETECTED   Barbiturates NONE DETECTED NONE DETECTED    Comment: (NOTE) DRUG SCREEN FOR MEDICAL PURPOSES ONLY.  IF CONFIRMATION IS NEEDED FOR ANY PURPOSE, NOTIFY LAB WITHIN 5 DAYS. LOWEST DETECTABLE LIMITS FOR URINE DRUG SCREEN Drug Class                     Cutoff (ng/mL) Amphetamine and metabolites    1000 Barbiturate and metabolites    200 Benzodiazepine                 829 Tricyclics and metabolites     300 Opiates and metabolites        300 Cocaine and metabolites        300 THC                            50 Performed at [facility], [name] [address] [place], [place] [address]   I-Stat beta hCG blood, ED     Status: None   Collection Time: 08/19/18  8:20 PM  Result Value Ref Range   I-stat hCG, quantitative <5.0 <5 mIU/mL   Comment 3            Comment:   GEST. AGE  CONC.  (mIU/mL)   <=1 WEEK        5 - 50     2 WEEKS       50 - 500     3 WEEKS       100 - 10,000     4 WEEKS     1,000 - 30,000        FEMALE AND NON-PREGNANT FEMALE:     LESS THAN 5 mIU/mL     Current Facility-Administered Medications  Medication Dose Route Frequency Provider Last Rate Last Dose  . furosemide (LASIX) tablet 40 mg  40 mg Oral BID Blanchie Dessert, MD   40 mg at 08/20/18 0805  . labetalol (NORMODYNE) tablet 200 mg  200 mg Oral TID Blanchie Dessert, MD   200 mg at 08/20/18 0959  . levothyroxine (SYNTHROID) tablet 75 mcg  75 mcg Oral Q0600 Blanchie Dessert, MD   75 mcg at 08/20/18 0700  . lisinopril (ZESTRIL) tablet 40 mg  40 mg Oral Daily Blanchie Dessert, MD   40 mg at 08/20/18 0958  . metFORMIN (GLUCOPHAGE) tablet 500 mg  500 mg Oral Q breakfast Blanchie Dessert, MD   500 mg at 08/20/18 0805  .  pantoprazole (PROTONIX) EC tablet 40 mg  40 mg Oral Daily Blanchie Dessert, MD   40 mg at 08/20/18 0958  . potassium chloride SA (K-DUR) CR tablet 40 mEq  40 mEq Oral Daily Blanchie Dessert, MD   40 mEq at 08/20/18 0959  . predniSONE (DELTASONE) tablet 5 mg  5 mg Oral Daily Blanchie Dessert, MD   5 mg at 08/20/18 2536   Current Outpatient Medications  Medication Sig Dispense Refill  . labetalol (NORMODYNE) 200 MG tablet Take 1 tablet (200 mg total) by mouth 3 (three) times daily. 90 tablet 11  . Vitamin D, Ergocalciferol, (DRISDOL) 1.25 MG (50000 UT) CAPS capsule TAKE 1 CAPSULE BY MOUTH EVERY 7 DAYS (Patient taking differently: Take 50,000 Units by mouth every Friday. ) 12 capsule 0  . acetaminophen (TYLENOL) 500 MG tablet Take 1,000 mg by mouth 3 (three) times daily as needed for moderate pain or headache.    . benzonatate (TESSALON) 100 MG capsule Take 1 capsule (100 mg total) by mouth 3 (three) times daily as needed for cough. (Patient not taking: Reported on 08/19/2018) 20 capsule 0  . cetirizine (ZYRTEC ALLERGY) 10 MG tablet Take 1 tablet (10 mg total) by mouth daily. 30 tablet 5  . esomeprazole (NEXIUM) 40 MG capsule Take 1 capsule (40 mg total) by mouth 2 (two) times daily. 180 capsule 2  . fluticasone (FLONASE) 50 MCG/ACT nasal spray Place 2 sprays into both nostrils daily.    . furosemide (LASIX) 40 MG tablet Take 1 tablet (40 mg total) by mouth 2 (two) times daily. 180 tablet 1  . ibuprofen (ADVIL) 200 MG tablet Take 400 mg by mouth every 6 (six) hours as needed for moderate pain.    Marland Kitchen levothyroxine (SYNTHROID) 75 MCG tablet Take 75 mcg by mouth daily before breakfast.    . lisinopril (PRINIVIL,ZESTRIL) 20 MG tablet Take 1 tablet (20 mg total) by mouth daily. (Patient not taking: Reported on 08/19/2018) 90 tablet 3  . lisinopril (ZESTRIL) 40 MG tablet Take 40 mg by mouth daily.    . metFORMIN (GLUCOPHAGE) 500 MG tablet Take 500 mg by mouth 2 (two) times daily with a meal.    . potassium  chloride SA (K-DUR,KLOR-CON) 20 MEQ tablet Take 2 tablets (40 mEq total) by  mouth daily. 180 tablet 2  . predniSONE (DELTASONE) 5 MG tablet Take 1 tablet (5 mg total) by mouth daily. 60 tablet 3    Musculoskeletal: Strength & Muscle Tone: UTA due to seen by telepsych. Gait & Station: normal Patient leans: N/A  Psychiatric Specialty Exam: Physical Exam  Nursing note and vitals reviewed. Constitutional: She is oriented to person, place, and time. She appears well-developed and well-nourished.  HENT:  Head: Normocephalic and atraumatic.  Neck: Normal range of motion.  Respiratory: Effort normal.  Musculoskeletal: Normal range of motion.  Neurological: She is alert and oriented to person, place, and time.  Psychiatric: Her behavior is normal. Her affect is labile. Her speech is tangential. Thought content is paranoid and delusional. Cognition and memory are impaired. She expresses impulsivity.    Review of Systems  Respiratory: Positive for shortness of breath.   All other systems reviewed and are negative.   Blood pressure (!) 173/103, pulse 88, temperature 98.7 F (37.1 C), temperature source Oral, resp. rate 18, last menstrual period 05/02/2017, SpO2 97 %.There is no height or weight on file to calculate BMI.  General Appearance: Fairly Groomed, middle aged, African American female with short hair who is standing. NAD.   Eye Contact:  Good  Speech:  Clear and Coherent and hyperverbal.   Volume:  Normal  Mood:  Dysphoric  Affect:  Labile  Thought Process:  Disorganized and Descriptions of Associations: Tangential  Orientation:  Full (Time, Place, and Person)  Thought Content:  Delusions and Paranoid Ideation  Suicidal Thoughts:  No  Homicidal Thoughts:  No  Memory:  Immediate;   Fair Recent;   Fair Remote;   Fair  Judgement:  Impaired  Insight:  Lacking  Psychomotor Activity:  Normal  Concentration:  Concentration: Fair and Attention Span: Poor  Recall:  Good  Fund of  Knowledge:  Good  Language:  Good  Akathisia:  No  Handed:  Right  AIMS (if indicated):   N/A  Assets:  Housing Resilience Social Support  ADL's:  Intact  Cognition:  Impaired due to psychiatric condition.   Sleep:   Poor   Assessment:  Madison Palmer is a 53 y.o. female who was admitted with psychosis in the setting of poor medication compliance. She is disorganized in behavior and thought process. She is labile in affect. She warrants inpatient psychiatric hospitalization for stabilization and treatment.    Treatment Plan Summary: Daily contact with patient to assess and evaluate symptoms and progress in treatment and Medication management  -Start Risperdal 1 mg BID for psychosis/mood stabilization.    Disposition: Recommend psychiatric Inpatient admission when medically cleared.  Faythe Dingwall, DO 08/20/2018 12:39 PM

## 2018-08-20 NOTE — ED Notes (Signed)
Pt awake, alert & responsive, no distress noted, cooperative and anxious at present.  Pt rambling and constantly praying at present since arrival to the dept.  1-1 sitter at bedside.  Monitoring for safety, pt religiously preoccupied.

## 2018-08-20 NOTE — ED Notes (Signed)
Patient wanted me to call [name], a friend, and update him on her care. Andrew's cell is [phone]

## 2018-08-20 NOTE — ED Notes (Addendum)
Per Gershon Mussel with [facility], [facility],  will be accepting patient if her BP is controlled and she has passed a Covid screening that they need faxed to them.  Dr Caren Hazy is accepting MD to [phone], report phone number is [phone], fax [phone]. If they do officially accept patient (pending conditions stated above), pt will need to go Before 11pm tonight or after 6am tomorrow.

## 2018-08-20 NOTE — ED Notes (Signed)
Dr Laverta Baltimore, ED Provider at bedside.

## 2018-08-20 NOTE — ED Notes (Signed)
Called [facility] and spoke with Danae Chen about patient's plan of care. Awaiting a call back with more information.

## 2018-08-20 NOTE — ED Notes (Signed)
Patient ambulated to bedside commode to urinate.

## 2018-08-20 NOTE — ED Notes (Signed)
Called and Spoke with [facility], they want at least two consecutive blood pressures 160/90 or lower before patient can be accepted there.

## 2018-08-20 NOTE — ED Provider Notes (Signed)
Blood pressure (!) 213/118, pulse 100, temperature 98.7 F (37.1 C), temperature source Oral, resp. rate 17, last menstrual period 05/02/2017, SpO2 98 %.  Assuming care from Dr. Billy Fischer.  In short, Madison Palmer is a 53 y.o. female with a chief complaint of IVC .  Refer to the original H&P for additional details.  Patient has been accepted to inpatient psychiatry. Will need BP to be lower. Home BP medications given.  Patient is not having any shortness of breath, cough, fever, or other infection type symptoms.  No concern clinically for COVID-19. Dr. Caren Hazy is the accepting MD.     Margette Fast, MD 08/20/18 [phone]

## 2018-08-20 NOTE — ED Notes (Signed)
Meredith from [facility] called stating that they are working on geri-psych placement for this patient.

## 2018-08-20 NOTE — Telephone Encounter (Signed)
FYI--pt still in the ED at the moment.

## 2018-08-20 NOTE — ED Notes (Signed)
X-ray at bedside

## 2018-08-21 MED ORDER — ZIPRASIDONE MESYLATE 20 MG IM SOLR
10.0000 mg | Freq: Once | INTRAMUSCULAR | Status: AC
  Administered 2018-08-21: 10 mg via INTRAMUSCULAR
  Filled 2018-08-21: qty 20

## 2018-08-21 MED ORDER — HALOPERIDOL LACTATE 5 MG/ML IJ SOLN
INTRAMUSCULAR | Status: AC
  Administered 2018-08-21: 03:00:00 5 mg
  Filled 2018-08-21: qty 1

## 2018-08-21 MED ORDER — HALOPERIDOL LACTATE 5 MG/ML IJ SOLN: 5.0000 mg | Freq: Once | INTRAMUSCULAR | Status: DC

## 2018-08-21 NOTE — Progress Notes (Signed)
Pt given lunch tray, states she doesn't want to eat

## 2018-08-21 NOTE — ED Provider Notes (Signed)
1:25 AM  Pt under IVC and will be transferred to [facility] for psychiatric treatment in the morning.  Nursing staff contacted me stating patient is becoming agitated and tried to leave.  Given Geodon IM earlier this evening which helped her significantly.  Nurse requesting second dose now.  Will give another 10 mg of IM Geodon now.  It appears she has documented allergy to medazepam so therefore will avoid benzodiazepines.   Ward, Delice Bison, DO 08/21/18 [phone]

## 2018-08-21 NOTE — ED Notes (Signed)
Report given to [facility]. Will update them at the time of pt. Departure. Sheriff's also updated, they will call back soon.

## 2018-08-21 NOTE — ED Notes (Signed)
[facility]SP[facility] REQUESTED.

## 2018-08-21 NOTE — ED Notes (Signed)
Mayer Camel called with heads-up that pt is on the way.

## 2018-08-21 NOTE — ED Notes (Signed)
Pt agitated, attempting to leave department.  Security at bedside for assistance.  Geodon given.

## 2018-08-21 NOTE — ED Notes (Signed)
Pt lifted back to bed on EMS stretcher with assist of ED staff and Security.  Pt resting in bed at present.

## 2018-08-21 NOTE — ED Notes (Addendum)
PT BELONGINGS, ONE BAG FOR CLOTHING AND ANOTHER BAG FOR HER PURSE, ARE IN LOCKER 13.  THEY WERE NOT SENT WITH HER TO [facility]. ([facility]OHER PT'S BELONGINGS WERE SENT BY MISTAKE) STAFF AT [facility] ARE AWARE THAT HER BELONGINGS ARE HERE. THEY ARE IN LOCKER #38.   PT'S PETITIONER IS TERRANCE YATES AT [address] IN [place], [place], SAME ADDRESS AS PT. HIS TELEPHONE NUMBER IS [phone]. HE WAS NOT CALLED BECAUSE OF CONCERNS WITH HIPPA. [facility] STAFF DID GET CONSENT TO RELEASE INFORMATION TO HIM SO THEY WILL LET HIM KNOW THAT HER BELONGINGS ARE HERE. THIS PER NURSE ROMONA AT [facility]. SECRETARY, WENDY, AT [facility] IS ALSO AWARE OF THIS SITUATION. PT'S CANE FOR AMBULATION IS WITH HER.   ALAINA AWARE OF THIS SITUATION; PLEASE SEE HER FOR CLARIFICATION.

## 2018-08-21 NOTE — ED Notes (Signed)
Transported to [place] by [place]. All belongings returned to pt. Pt was in our [facility] and refused to get into [facility] WC, so they borrowed our WC to take her to [place]. Per report. It took the EMS 2 hours to get pt into a wheel chair yesterday when they picked her up. Pt is able to weight bear to the BR without assist, but uses a cane otherwise.

## 2018-08-21 NOTE — ED Notes (Addendum)
When attempting to assist pt back to bed, pt squatted and sat on floor refusing to get back to bed with assistance.  Attempted to lift pt, with assist of security, pt resistant.  Pt sitting on floor at present.  No distress noted.

## 2018-08-21 NOTE — Progress Notes (Signed)
Pt given breakfast tray

## 2018-08-21 NOTE — ED Notes (Signed)
COVID LAB RESULTS FAXED TO [facility], [place] [phone].

## 2018-08-22 DIAGNOSIS — F23 Brief psychotic disorder: Secondary | ICD-10-CM | POA: Insufficient documentation

## 2018-08-22 HISTORY — DX: Brief psychotic disorder: F23

## 2018-08-23 ENCOUNTER — Ambulatory Visit: Payer: BLUE CROSS/BLUE SHIELD | Admitting: Nurse Practitioner

## 2018-08-23 ENCOUNTER — Telehealth: Payer: Self-pay | Admitting: Nurse Practitioner

## 2018-08-23 DIAGNOSIS — F19951 Other psychoactive substance use, unspecified with psychoactive substance-induced psychotic disorder with hallucinations: Secondary | ICD-10-CM | POA: Insufficient documentation

## 2018-08-23 NOTE — Telephone Encounter (Signed)
Tried to reach patient today, but she did not answer her phone. I was unable to leave voice message, her mailbox was full.
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
## 2018-08-26 NOTE — Telephone Encounter (Signed)
Madison Palmer called back to advise Madison Palmer and update. He called to give Madison Palmer the phone number pf the NP helping the pt. Madison Palmer, the NP phone  [phone]  He also states thank you for all your help.

## 2018-08-28 DIAGNOSIS — F312 Bipolar disorder, current episode manic severe with psychotic features: Secondary | ICD-10-CM | POA: Insufficient documentation

## 2018-08-28 HISTORY — DX: Bipolar disorder, current episode manic severe with psychotic features: F31.2

## 2018-08-30 ENCOUNTER — Telehealth: Payer: Self-pay

## 2018-08-30 NOTE — Telephone Encounter (Signed)
LOV 07/30/18. Per Dr. Loanne Drilling, follow-up in 6 months. LVM requesting returned call.

## 2018-09-02 ENCOUNTER — Ambulatory Visit (INDEPENDENT_AMBULATORY_CARE_PROVIDER_SITE_OTHER): Payer: Self-pay | Admitting: Family Medicine

## 2018-09-03 ENCOUNTER — Ambulatory Visit: Payer: BLUE CROSS/BLUE SHIELD | Admitting: Physical Therapy

## 2018-09-05 ENCOUNTER — Ambulatory Visit (INDEPENDENT_AMBULATORY_CARE_PROVIDER_SITE_OTHER): Payer: BLUE CROSS/BLUE SHIELD | Admitting: Family Medicine

## 2018-09-05 ENCOUNTER — Encounter (INDEPENDENT_AMBULATORY_CARE_PROVIDER_SITE_OTHER): Payer: Self-pay | Admitting: Family Medicine

## 2018-09-05 ENCOUNTER — Other Ambulatory Visit: Payer: Self-pay

## 2018-09-05 DIAGNOSIS — Z6841 Body Mass Index (BMI) 40.0 and over, adult: Secondary | ICD-10-CM

## 2018-09-05 DIAGNOSIS — E559 Vitamin D deficiency, unspecified: Secondary | ICD-10-CM

## 2018-09-05 DIAGNOSIS — F312 Bipolar disorder, current episode manic severe with psychotic features: Secondary | ICD-10-CM

## 2018-09-05 DIAGNOSIS — R7303 Prediabetes: Secondary | ICD-10-CM | POA: Diagnosis not present

## 2018-09-05 MED ORDER — LURASIDONE HCL 40 MG PO TABS
60.00 | ORAL_TABLET | ORAL | Status: DC
Start: 2018-09-04 — End: 2018-09-05

## 2018-09-05 MED ORDER — ACETAMINOPHEN 325 MG PO TABS
650.00 | ORAL_TABLET | ORAL | Status: DC
Start: ? — End: 2018-09-05

## 2018-09-05 MED ORDER — HYDRALAZINE HCL 50 MG PO TABS
100.00 | ORAL_TABLET | ORAL | Status: DC
Start: 2018-09-04 — End: 2018-09-05

## 2018-09-05 MED ORDER — VITAMIN D (ERGOCALCIFEROL) 1.25 MG (50000 UNIT) PO CAPS: ORAL_CAPSULE | ORAL | 0 refills | Status: DC

## 2018-09-05 MED ORDER — CLONIDINE HCL 0.1 MG PO TABS
0.10 | ORAL_TABLET | ORAL | Status: DC
Start: ? — End: 2018-09-05

## 2018-09-05 MED ORDER — VITAMIN D (ERGOCALCIFEROL) 1.25 MG (50000 UNIT) PO CAPS
50000.00 | ORAL_CAPSULE | ORAL | Status: DC
Start: 2018-09-05 — End: 2018-09-05

## 2018-09-05 MED ORDER — CLONIDINE HCL 0.2 MG PO TABS
0.20 | ORAL_TABLET | ORAL | Status: DC
Start: 2018-09-04 — End: 2018-09-05

## 2018-09-05 MED ORDER — LABETALOL HCL 200 MG PO TABS
600.00 | ORAL_TABLET | ORAL | Status: DC
Start: ? — End: 2018-09-05

## 2018-09-05 MED ORDER — FLUTICASONE PROPIONATE 50 MCG/ACT NA SUSP
1.00 | NASAL | Status: DC
Start: 2018-09-05 — End: 2018-09-05

## 2018-09-05 MED ORDER — FUROSEMIDE 20 MG PO TABS
40.00 | ORAL_TABLET | ORAL | Status: DC
Start: 2018-09-05 — End: 2018-09-05

## 2018-09-05 MED ORDER — POTASSIUM CHLORIDE ER 10 MEQ PO TBCR
40.00 | EXTENDED_RELEASE_TABLET | ORAL | Status: DC
Start: 2018-09-05 — End: 2018-09-05

## 2018-09-05 MED ORDER — MAGNESIUM HYDROXIDE 400 MG/5ML PO SUSP
15.00 | ORAL | Status: DC
Start: ? — End: 2018-09-05

## 2018-09-05 MED ORDER — LEVOTHYROXINE SODIUM 75 MCG PO TABS
75.00 | ORAL_TABLET | ORAL | Status: DC
Start: 2018-09-05 — End: 2018-09-05

## 2018-09-05 MED ORDER — TRAZODONE HCL 50 MG PO TABS
50.00 | ORAL_TABLET | ORAL | Status: DC
Start: ? — End: 2018-09-05

## 2018-09-05 MED ORDER — DIVALPROEX SODIUM 500 MG PO DR TAB
500.00 | DELAYED_RELEASE_TABLET | ORAL | Status: DC
Start: 2018-09-04 — End: 2018-09-05

## 2018-09-05 MED ORDER — GENERIC EXTERNAL MEDICATION
Status: DC
Start: ? — End: 2018-09-05

## 2018-09-05 MED ORDER — LISINOPRIL 20 MG PO TABS
40.00 | ORAL_TABLET | ORAL | Status: DC
Start: 2018-09-05 — End: 2018-09-05

## 2018-09-05 MED ORDER — AMLODIPINE BESYLATE 5 MG PO TABS
10.00 | ORAL_TABLET | ORAL | Status: DC
Start: 2018-09-05 — End: 2018-09-05

## 2018-09-05 MED ORDER — METFORMIN HCL 500 MG PO TABS
500.00 | ORAL_TABLET | ORAL | Status: DC
Start: 2018-09-04 — End: 2018-09-05

## 2018-09-05 MED ORDER — PANTOPRAZOLE SODIUM 40 MG PO TBEC
40.00 | DELAYED_RELEASE_TABLET | ORAL | Status: DC
Start: 2018-09-05 — End: 2018-09-05

## 2018-09-05 MED ORDER — ALUM & MAG HYDROXIDE-SIMETH 200-200-20 MG/5ML PO SUSP
30.00 | ORAL | Status: DC
Start: ? — End: 2018-09-05

## 2018-09-05 NOTE — Progress Notes (Signed)
Office: [phone]  /  Fax: [phone] TeleHealth Visit:  Madison Palmer has verbally consented to this TeleHealth visit today. The patient is located at home, the provider is located at the [employer] [facility] office. The participants in this visit include the listed provider, patient, the patient's friend, Madison Palmer, and Madison Chol, NP (patient's friend). The visit was conducted today via telephone call. Total time of CMA/LPN and provider calls to patient was approximately 25 minutes in length.  HPI:   Chief Complaint: OBESITY Madison Palmer is here to discuss her progress with her obesity treatment plan. She is on the pescatarian plus 300 calorie plan and is following her eating plan approximately 100% of the time. She states she is exercising 0 minutes 0 times per week. She did tell me however that she is eating other protein besides fish.  Madison Palmer states her weight now is 323 lbs, reflecting a 7 lb weight loss.  She was hospitalized until yesterday for a manic episode of her bipolar disorder so she had been eating hospital food.  We were unable to weigh the patient today for this TeleHealth visit. She feels as if she has lost 10 lbs since her last visit. She has lost 27 lbs since starting treatment with [place].  Vitamin D deficiency Madison Palmer has a diagnosis of Vitamin D deficiency, which is not at goal. Her last Vitamin D level was reported to be 18 on 05/29/2017. She is currently taking prescription Vit D and denies nausea, vomiting or muscle weakness.  Pre-Diabetes Madison Palmer has a diagnosis of prediabetes based on her elevated HgA1c and was informed this puts her at greater risk of developing diabetes. She is taking metformin currently and continues to work on diet and exercise to decrease risk of diabetes. She denies polyphagia or hypoglycemia.  Bipolar I disorder Patient was discharged yesterday from a 14 day inpatient [place]. She had been off of her medications  before her hospital admission. She is now currently on Latuda and depakote. She is responding to questions appropriately and able to engage in normal conversation.  Her friend Madison Palmer who is a Designer, jewellery is on our call and confirms that she is on her medications.   ASSESSMENT AND PLAN:  Vitamin D deficiency - Plan: Vitamin D, Ergocalciferol, (DRISDOL) 1.25 MG (50000 UT) CAPS capsule  Prediabetes  Bipolar I disorder, current or most recent episode manic, with psychotic features (HCC)  Class 3 severe obesity with serious comorbidity and body mass index (BMI) of 60.0 to 69.9 in adult, unspecified obesity type (Madison Palmer)  PLAN:  Vitamin D Deficiency Madison Palmer was informed that low Vitamin D levels contributes to fatigue and are associated with obesity, breast, [name],[name], at least 2-3 times per year. She was informed of the risk of over-replacement of Vitamin D and agrees to not increase her dose unless she discusses this with [place] first. Madison Palmer agrees to follow-up with our clinic in 2-3 weeks.  Pre-Diabetes Madison Palmer will continue to work on weight loss, exercise, and decreasing simple carbohydrates in her diet to help decrease the risk of diabetes. We dicussed metformin including benefits and risks. She was informed that eating too many simple carbohydrates or too many calories at one sitting increases the likelihood of GI side effects. Madison Palmer will continue metformin and agrees to follow-up with [place] as  directed to monitor her progress.  Bipolar I disorder She will continue to follow up with psychiatry and continue to take Madison Palmer and depakote.    Obesity Madison Palmer is currently in the action stage of change. I discussed with her her willingness to continue with our plan and she is motivated to continue to try to lose weight.  As such, her goal is to  continue with weight loss efforts. She has agreed to follow the Category 3 plan. Handout was sent to the patient via MyChart on the Category 3 plan. Madison Palmer has been instructed to walk to the mailbox twice daily for weight loss and overall health benefits. We discussed the following Behavioral Modification Strategies today: increasing lean protein intake.  Madison Palmer has agreed to follow-up with our clinic in 2-3 weeks. She was informed of the importance of frequent follow-up visits to maximize her success with intensive lifestyle modifications for her multiple health conditions.  I will transfer her back to Madison Palmer for continuity of care.   ALLERGIES: Allergies  Allergen Reactions  . Fentanyl Hives  . Levofloxacin Hives  . Midazolam Hives  . Pollen Extract     seasonal  . Atorvastatin     Muscle pain in legs   . Doxycycline Itching and Swelling  . Hydralazine Hcl Other (See Comments)    Hypercalcemia   . Rosuvastatin     Abdominal pain    MEDICATIONS: Current Outpatient Medications on File Prior to Visit  Medication Sig Dispense Refill  . acetaminophen (TYLENOL) 500 MG tablet Take 1,000 mg by mouth 3 (three) times daily as needed for moderate pain or headache.    . benzonatate (TESSALON) 100 MG capsule Take 1 capsule (100 mg total) by mouth 3 (three) times daily as needed for cough. (Patient not taking: Reported on 08/19/2018) 20 capsule 0  . cetirizine (ZYRTEC ALLERGY) 10 MG tablet Take 1 tablet (10 mg total) by mouth daily. 30 tablet 5  . esomeprazole (NEXIUM) 40 MG capsule Take 1 capsule (40 mg total) by mouth 2 (two) times daily. 180 capsule 2  . fluticasone (FLONASE) 50 MCG/ACT nasal spray Place 2 sprays into both nostrils daily.    . furosemide (LASIX) 40 MG tablet Take 1 tablet (40 mg total) by mouth 2 (two) times daily. 180 tablet 1  . ibuprofen (ADVIL) 200 MG tablet Take 400 mg by mouth every 6 (six) hours as needed for moderate pain.    Marland Kitchen labetalol (NORMODYNE) 200 MG  tablet Take 1 tablet (200 mg total) by mouth 3 (three) times daily. 90 tablet 11  . levothyroxine (SYNTHROID) 75 MCG tablet Take 75 mcg by mouth daily before breakfast.    . lisinopril (PRINIVIL,ZESTRIL) 20 MG tablet Take 1 tablet (20 mg total) by mouth daily. (Patient not taking: Reported on 08/19/2018) 90 tablet 3  . lisinopril (ZESTRIL) 40 MG tablet Take 40 mg by mouth daily.    . metFORMIN (GLUCOPHAGE) 500 MG tablet Take 500 mg by mouth 2 (two) times daily with a meal.    . potassium chloride SA (K-DUR,KLOR-CON) 20 MEQ tablet Take 2 tablets (40 mEq total) by mouth daily. 180 tablet 2  . predniSONE (DELTASONE) 5 MG tablet Take 1 tablet (5 mg total) by mouth daily. 60 tablet 3   No current facility-administered medications on file prior to visit.     PAST MEDICAL HISTORY: Past Medical History:  Diagnosis Date  . Anemia   . Anxiety   . Arrhythmia    tachycardia  . Arthritis   .  Chickenpox   . Depression   . Diverticulitis   . GERD (gastroesophageal reflux disease)   . Glaucoma   . Hyperlipidemia   . Hyperparathyroidism ([place])   . Hypertension   . Inflammatory polyps of colon ([place])   . LVH (left ventricular hypertrophy)   . Lymphedema   . PCOS (polycystic ovarian syndrome)   . Prediabetes   . Recurrent UTI   . Sleep apnea    CPAP  . Thyroid disease   . Vitamin D deficiency     PAST SURGICAL HISTORY: Past Surgical History:  Procedure Laterality Date  . BREAST BIOPSY  2015  . CESAREAN SECTION  2004  . INNER EAR SURGERY     ear and sinus surgery  . LAPAROSCOPIC REPAIR AND REMOVAL OF GASTRIC BAND    . OOPHORECTOMY Left   . TONSILLECTOMY AND ADENOIDECTOMY      SOCIAL HISTORY: Social History   Tobacco Use  . Smoking status: Never Smoker  . Smokeless tobacco: Never Used  Substance Use Topics  . Alcohol use: Yes    Comment: social  . Drug use: No    FAMILY HISTORY: Family History  Adopted: Yes  Problem Relation Age of Onset  . Hearing loss Son        right    . Healthy Son    ROS: Review of Systems  Gastrointestinal: Negative for nausea and vomiting.  Musculoskeletal:       Negative for muscle weakness.  Endo/Heme/Allergies:       Negative for polyphagia. Negative for hypoglycemia.   PHYSICAL EXAM: Pt in no acute distress  RECENT LABS AND TESTS: BMET    Component Value Date/Time   NA 142 08/19/2018 2019   NA 143 07/08/2018 1359   K 3.4 (L) 08/19/2018 2019   CL 103 08/19/2018 2019   CO2 28 08/19/2018 2019   GLUCOSE 124 (H) 08/19/2018 2019   BUN 20 08/19/2018 2019   BUN 30 (H) 07/08/2018 1359   CREATININE 0.83 08/19/2018 2019   CREATININE 0.94 12/21/2017 1640   CALCIUM 10.8 (H) 08/19/2018 2019   GFRNONAA >60 08/19/2018 2019   GFRAA >60 08/19/2018 2019   Lab Results  Component Value Date   HGBA1C 6.0 (H) 02/27/2018   HGBA1C 5.7 09/10/2017   HGBA1C 5.9 05/29/2017   Lab Results  Component Value Date   INSULIN 9.6 02/27/2018   CBC    Component Value Date/Time   WBC 9.9 08/19/2018 2019   RBC 5.59 (H) 08/19/2018 2019   HGB 16.1 (H) 08/19/2018 2019   HCT 50.2 (H) 08/19/2018 2019   PLT 274 08/19/2018 2019   MCV 89.8 08/19/2018 2019   MCH 28.8 08/19/2018 2019   MCHC 32.1 08/19/2018 2019   RDW 15.9 (H) 08/19/2018 2019   LYMPHSABS 1.0 05/11/2018 0630   MONOABS 0.5 05/11/2018 0630   EOSABS 0.1 05/11/2018 0630   BASOSABS 0.0 05/11/2018 0630   Iron/TIBC/Ferritin/ %Sat    Component Value Date/Time   IRON 44 02/20/2018 1536   IRON 81 05/29/2017   TIBC 347 05/29/2017   IRONPCTSAT 8.8 (L) 02/20/2018 1536   Lipid Panel     Component Value Date/Time   Palmer 196 02/27/2018 1233   TRIG 142 02/27/2018 1233   HDL 46 02/27/2018 1233   CHOLHDL 5 09/10/2017 1156   VLDL 26.6 09/10/2017 1156   LDLCALC 122 (H) 02/27/2018 1233   Hepatic Function Panel     Component Value Date/Time   PROT 7.7 08/19/2018 2019   PROT 6.9  02/27/2018 1233   ALBUMIN 4.1 08/19/2018 2019   ALBUMIN 4.3 02/27/2018 1233   AST 27 08/19/2018 2019    ALT 30 08/19/2018 2019   ALKPHOS 91 08/19/2018 2019   BILITOT 1.2 08/19/2018 2019   BILITOT 0.9 02/27/2018 1233   BILIDIR 0.3 (H) 05/11/2018 0630   IBILI 1.3 (H) 05/11/2018 0630      Component Value Date/Time   TSH 0.938 05/11/2018 0630   TSH 1.450 02/27/2018 1233   TSH 0.97 12/11/2017 1158   TSH 1.28 05/29/2017   Results for HERMILA, MILLIS (MRN [id number]) as of 09/05/2018 11:17  Ref. Range 05/29/2017 00:00  Vitamin D, 25-Hydroxy Unknown 18   I, Michaelene Song, am acting as Location manager for [employer], FNP-C.  I have reviewed the above documentation for accuracy and completeness, and I agree with the above.  - Timiya Howells, FNP-C.

## 2018-09-09 ENCOUNTER — Ambulatory Visit (HOSPITAL_BASED_OUTPATIENT_CLINIC_OR_DEPARTMENT_OTHER): Payer: BLUE CROSS/BLUE SHIELD

## 2018-09-11 ENCOUNTER — Encounter (HOSPITAL_COMMUNITY): Payer: Self-pay

## 2018-09-11 ENCOUNTER — Emergency Department (HOSPITAL_COMMUNITY)
Admission: EM | Admit: 2018-09-11 | Discharge: 2018-09-12 | Disposition: A | Discharge status: Other Acute Inpatient Hospital | Payer: BLUE CROSS/BLUE SHIELD | Source: Home / Self Care | Attending: Emergency Medicine | Admitting: Emergency Medicine

## 2018-09-11 DIAGNOSIS — F22 Delusional disorders: Secondary | ICD-10-CM | POA: Insufficient documentation

## 2018-09-11 DIAGNOSIS — Z046 Encounter for general psychiatric examination, requested by authority: Secondary | ICD-10-CM | POA: Insufficient documentation

## 2018-09-11 DIAGNOSIS — F250 Schizoaffective disorder, bipolar type: Secondary | ICD-10-CM | POA: Diagnosis not present

## 2018-09-11 DIAGNOSIS — I10 Essential (primary) hypertension: Secondary | ICD-10-CM | POA: Insufficient documentation

## 2018-09-11 DIAGNOSIS — F3113 Bipolar disorder, current episode manic without psychotic features, severe: Secondary | ICD-10-CM | POA: Diagnosis not present

## 2018-09-11 DIAGNOSIS — F29 Unspecified psychosis not due to a substance or known physiological condition: Secondary | ICD-10-CM | POA: Insufficient documentation

## 2018-09-11 DIAGNOSIS — Z1159 Encounter for screening for other viral diseases: Secondary | ICD-10-CM | POA: Insufficient documentation

## 2018-09-11 DIAGNOSIS — Z9114 Patient's other noncompliance with medication regimen: Secondary | ICD-10-CM | POA: Insufficient documentation

## 2018-09-11 LAB — CBC WITH DIFFERENTIAL/PLATELET
Abs Immature Granulocytes: 0.05 10*3/uL (ref 0.00–0.07)
Basophils Absolute: 0 10*3/uL (ref 0.0–0.1)
Basophils Relative: 1 %
Eosinophils Absolute: 0.1 10*3/uL (ref 0.0–0.5)
Eosinophils Relative: 1 %
HCT: 41.6 % (ref 36.0–46.0)
Hemoglobin: 13.3 g/dL (ref 12.0–15.0)
Immature Granulocytes: 1 %
Lymphocytes Relative: 20 %
Lymphs Abs: 1.3 10*3/uL (ref 0.7–4.0)
MCH: 29.8 pg (ref 26.0–34.0)
MCHC: 32 g/dL (ref 30.0–36.0)
MCV: 93.1 fL (ref 80.0–100.0)
Monocytes Absolute: 0.6 10*3/uL (ref 0.1–1.0)
Monocytes Relative: 9 %
Neutro Abs: 4.6 10*3/uL (ref 1.7–7.7)
Neutrophils Relative %: 68 %
Platelets: 219 10*3/uL (ref 150–400)
RBC: 4.47 MIL/uL (ref 3.87–5.11)
RDW: 14.7 % (ref 11.5–15.5)
WBC: 6.6 10*3/uL (ref 4.0–10.5)
nRBC: 0 % (ref 0.0–0.2)

## 2018-09-11 LAB — COMPREHENSIVE METABOLIC PANEL
ALT: 82 U/L — ABNORMAL HIGH (ref 0–44)
AST: 34 U/L (ref 15–41)
Albumin: 3.8 g/dL (ref 3.5–5.0)
Alkaline Phosphatase: 79 U/L (ref 38–126)
Anion gap: 10 (ref 5–15)
BUN: 27 mg/dL — ABNORMAL HIGH (ref 6–20)
CO2: 28 mmol/L (ref 22–32)
Calcium: 10.1 mg/dL (ref 8.9–10.3)
Chloride: 106 mmol/L (ref 98–111)
Creatinine, Ser: 1.07 mg/dL — ABNORMAL HIGH (ref 0.44–1.00)
GFR calc Af Amer: >60 mL/min (ref >60)
GFR calc non Af Amer: 60 mL/min — ABNORMAL LOW (ref >60)
Glucose, Bld: 120 mg/dL — ABNORMAL HIGH (ref 70–99)
Potassium: 3.7 mmol/L (ref 3.5–5.1)
Sodium: 144 mmol/L (ref 135–145)
Total Bilirubin: 0.7 mg/dL (ref 0.3–1.2)
Total Protein: 6.8 g/dL (ref 6.5–8.1)

## 2018-09-11 LAB — RAPID URINE DRUG SCREEN, HOSP PERFORMED
Amphetamines: NOT DETECTED
Barbiturates: NOT DETECTED
Benzodiazepines: NOT DETECTED
Cocaine: NOT DETECTED
Opiates: NOT DETECTED
Tetrahydrocannabinol: NOT DETECTED

## 2018-09-11 LAB — I-STAT BETA HCG BLOOD, ED (MC, WL, AP ONLY): I-stat hCG, quantitative: 6.6 m[IU]/mL — ABNORMAL HIGH (ref <5)

## 2018-09-11 LAB — ETHANOL: Alcohol, Ethyl (B): <10 mg/dL (ref <10)

## 2018-09-11 LAB — HCG, QUANTITATIVE, PREGNANCY: hCG, Beta Chain, Quant, S: 4 m[IU]/mL (ref <5)

## 2018-09-11 MED ORDER — PANTOPRAZOLE SODIUM 40 MG PO TBEC
40.0000 mg | DELAYED_RELEASE_TABLET | Freq: Every day | ORAL | Status: DC
  Administered 2018-09-12: 40 mg via ORAL
  Filled 2018-09-11: qty 1

## 2018-09-11 MED ORDER — PREDNISONE 5 MG PO TABS
5.0000 mg | ORAL_TABLET | Freq: Every day | ORAL | Status: DC
  Administered 2018-09-12: 5 mg via ORAL
  Filled 2018-09-11: qty 1

## 2018-09-11 MED ORDER — LORATADINE 10 MG PO TABS
10.0000 mg | ORAL_TABLET | Freq: Every day | ORAL | Status: DC
  Administered 2018-09-12: 10 mg via ORAL
  Filled 2018-09-11: qty 1

## 2018-09-11 MED ORDER — POTASSIUM CHLORIDE CRYS ER 20 MEQ PO TBCR
40.0000 meq | EXTENDED_RELEASE_TABLET | Freq: Every day | ORAL | Status: DC
  Administered 2018-09-12: 40 meq via ORAL
  Filled 2018-09-11: qty 2

## 2018-09-11 MED ORDER — LABETALOL HCL 300 MG PO TABS
600.0000 mg | ORAL_TABLET | Freq: Two times a day (BID) | ORAL | Status: DC
  Administered 2018-09-12: 600 mg via ORAL
  Filled 2018-09-11 (×3): qty 2

## 2018-09-11 MED ORDER — LISINOPRIL 20 MG PO TABS: 40.0000 mg | ORAL_TABLET | Freq: Every day | ORAL | Status: DC

## 2018-09-11 MED ORDER — AMLODIPINE BESYLATE 5 MG PO TABS
10.0000 mg | ORAL_TABLET | Freq: Every day | ORAL | Status: DC
  Filled 2018-09-11: qty 2

## 2018-09-11 MED ORDER — LEVOTHYROXINE SODIUM 75 MCG PO TABS
75.0000 ug | ORAL_TABLET | Freq: Every day | ORAL | Status: DC
  Administered 2018-09-12: 75 ug via ORAL
  Filled 2018-09-11: qty 1

## 2018-09-11 MED ORDER — CLONIDINE HCL 0.1 MG PO TABS
0.2000 mg | ORAL_TABLET | Freq: Two times a day (BID) | ORAL | Status: DC
  Administered 2018-09-12: 0.2 mg via ORAL
  Filled 2018-09-11: qty 2

## 2018-09-11 MED ORDER — FLUTICASONE PROPIONATE 50 MCG/ACT NA SUSP
2.0000 | Freq: Every day | NASAL | Status: DC
  Administered 2018-09-12: 2 via NASAL
  Filled 2018-09-11: qty 16

## 2018-09-11 MED ORDER — DIVALPROEX SODIUM 500 MG PO DR TAB
500.0000 mg | DELAYED_RELEASE_TABLET | Freq: Two times a day (BID) | ORAL | Status: DC
  Administered 2018-09-12 (×2): 500 mg via ORAL
  Filled 2018-09-11 (×2): qty 1

## 2018-09-11 MED ORDER — ACETAMINOPHEN 500 MG PO TABS: 1000.0000 mg | ORAL_TABLET | Freq: Three times a day (TID) | ORAL | Status: DC | PRN

## 2018-09-11 NOTE — ED Notes (Signed)
Pt on call with TTS 

## 2018-09-11 NOTE — ED Triage Notes (Signed)
Pt reported talking to self.

## 2018-09-11 NOTE — ED Notes (Signed)
Bed: WA09 Expected date:  Expected time:  Means of arrival:  Comments: EMS : confusion AMS

## 2018-09-11 NOTE — ED Provider Notes (Signed)
[facility] Emergency Department Provider Note MRN:  [id number]  Arrival date & time: 09/12/18     Chief Complaint   Altered Mental Status   History of Present Illness   Madison Palmer is a 53 y.o. year-old female with a history of bipolar disorder presenting to the ED with chief complaint of altered mental status.  Patient has been noncompliant with her medications.  Report of those are behavior from her care facility, increased sexual behavior, seems to be hallucinating and responding to external stimuli.  Here patient is confused, thinks that she is giving birth, continues to comment on how she feels like she needs to push.  I was unable to obtain an accurate HPI, PMH, or ROS due to the patient's acute psychosis.  Review of Systems  Positive for psychosis.  Patient's Health History    Past Medical History:  Diagnosis Date  . Anemia   . Anxiety   . Arrhythmia    tachycardia  . Arthritis   . Chickenpox   . Depression   . Diverticulitis   . GERD (gastroesophageal reflux disease)   . Glaucoma   . Hyperlipidemia   . Hyperparathyroidism ([place])   . Hypertension   . Inflammatory polyps of colon ([place])   . LVH (left ventricular hypertrophy)   . Lymphedema   . PCOS (polycystic ovarian syndrome)   . Prediabetes   . Recurrent UTI   . Sleep apnea    CPAP  . Thyroid disease   . Vitamin D deficiency     Past Surgical History:  Procedure Laterality Date  . BREAST BIOPSY  2015  . CESAREAN SECTION  2004  . INNER EAR SURGERY     ear and sinus surgery  . LAPAROSCOPIC REPAIR AND REMOVAL OF GASTRIC BAND    . OOPHORECTOMY Left   . TONSILLECTOMY AND ADENOIDECTOMY      Family History  Adopted: Yes  Problem Relation Age of Onset  . Hearing loss Son        right   . Healthy Son     Social History   Socioeconomic History  . Marital status: Married    Spouse name: Not on file  . Number of children: 1  . Years of education: Not on file  . Highest  education level: Not on file  Occupational History  . Occupation: Unemployed  Social Needs  . Financial resource strain: Not on file  . Food insecurity:    Worry: Not on file    Inability: Not on file  . Transportation needs:    Medical: Not on file    Non-medical: Not on file  Tobacco Use  . Smoking status: Never Smoker  . Smokeless tobacco: Never Used  Substance and Sexual Activity  . Alcohol use: Yes    Comment: social  . Drug use: No  . Sexual activity: Not Currently  Lifestyle  . Physical activity:    Days per week: Not on file    Minutes per session: Not on file  . Stress: Not on file  Relationships  . Social connections:    Talks on phone: Not on file    Gets together: Not on file    Attends religious service: Not on file    Active member of club or organization: Not on file    Attends meetings of clubs or organizations: Not on file    Relationship status: Not on file  . Intimate partner violence:    Fear of current or  ex partner: Not on file    Emotionally abused: Not on file    Physically abused: Not on file    Forced sexual activity: Not on file  Other Topics Concern  . Not on file  Social History Narrative   Lives with son and companion.       Physical Exam  Vital Signs and Nursing Notes reviewed Vitals:   09/11/18 2115 09/11/18 2145  BP: (!) 120/108   Pulse:  92  Resp:    Temp:    SpO2:  (!) 86%    CONSTITUTIONAL: Chronically ill-appearing, NAD NEURO:  Alert and oriented x 3, no focal deficits EYES:  eyes equal and reactive ENT/NECK:  no LAD, no JVD CARDIO: Regular rate, well-perfused, normal S1 and S2 PULM:  CTAB no wheezing or rhonchi GI/GU:  normal bowel sounds, non-distended, non-tender MSK/SPINE:  No gross deformities, no edema SKIN:  no rash, atraumatic PSYCH: Acutely psychotic, delusional  Diagnostic and Interventional Summary    EKG Interpretation  Date/Time:  Wednesday Sep 11 2018 22:30:28 EDT Ventricular Rate:  106 PR  Interval:    QRS Duration: 99 QT Interval:  364 QTC Calculation: 484 R Axis:   21 Text Interpretation:  Sinus tachycardia LAE, consider biatrial enlargement ST elev, probable normal early repol pattern Confirmed by [name] Fee [phone]) on 09/11/2018 11:00:58 PM      Labs Reviewed  COMPREHENSIVE METABOLIC PANEL - Abnormal; Notable for the following components:      Result Value   Glucose, Bld 120 (*)    BUN 27 (*)    Creatinine, Ser 1.07 (*)    ALT 82 (*)    GFR calc non Af Amer 60 (*)    All other components within normal limits  I-STAT BETA HCG BLOOD, ED ([facility], [facility], [facility] ONLY) - Abnormal; Notable for the following components:   I-stat hCG, quantitative 6.6 (*)    All other components within normal limits  ETHANOL  RAPID URINE DRUG SCREEN, HOSP PERFORMED  CBC WITH DIFFERENTIAL/PLATELET  HCG, QUANTITATIVE, PREGNANCY    No orders to display    Medications  acetaminophen (TYLENOL) tablet 1,000 mg (has no administration in time range)  amLODipine (NORVASC) tablet 10 mg (has no administration in time range)  loratadine (CLARITIN) tablet 10 mg (has no administration in time range)  cloNIDine (CATAPRES) tablet 0.2 mg (has no administration in time range)  divalproex (DEPAKOTE) DR tablet 500 mg (has no administration in time range)  pantoprazole (PROTONIX) EC tablet 40 mg (has no administration in time range)  fluticasone (FLONASE) 50 MCG/ACT nasal spray 2 spray (has no administration in time range)  labetalol (NORMODYNE) tablet 600 mg (has no administration in time range)  levothyroxine (SYNTHROID) tablet 75 mcg (has no administration in time range)  lisinopril (ZESTRIL) tablet 40 mg (has no administration in time range)  potassium chloride SA (K-DUR) CR tablet 40 mEq (has no administration in time range)  predniSONE (DELTASONE) tablet 5 mg (has no administration in time range)     Procedures Critical Care  ED Course and Medical Decision Making  I have reviewed the triage vital  signs and the nursing notes.  Pertinent labs & imaging results that were available during my care of the patient were reviewed by me and considered in my medical decision making (see below for details).  Patient seems to be under the delusion of pregnancy in active labor.  She had a negative pregnancy test less than 1 month ago.  She denies pain but persistently  states "I feel like I need to push".  She says this without any distress, laying on her abdomen, continuously adjusting the bed.  IVC paperwork has been filled out, will obtain screening labs for medical clearance.  Medically cleared, i-STAT hCG was falsely positive, confirmatory serum quant was negative.  Signed out to default provider at shift change.  Barth Kirks. [place]onia Small, [place] [email or web address]  Final Clinical Impressions(s) / ED Diagnoses     ICD-10-CM   1. Psychosis, unspecified psychosis type ([place]) F29   2. Delusion [facility]) [place]     ED Discharge Orders    None         Maudie Flakes, MD 09/12/18 [phone]
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
## 2018-09-11 NOTE — ED Notes (Signed)
Pt yelling "I'm having a baby, I have to push now!!". Pt sits on edge of bed and yells for someone to help her even with staff at bedside giving reassurance that she is receiving help. Pt laying down currently and given a blanket.

## 2018-09-11 NOTE — ED Notes (Signed)
Pt refusing to change out of street clothes.

## 2018-09-12 ENCOUNTER — Inpatient Hospital Stay (HOSPITAL_COMMUNITY)
Admission: AD | Admit: 2018-09-12 | Discharge: 2018-09-15 | DRG: 885 | Disposition: A | Discharge status: Other Acute Inpatient Hospital | Payer: BLUE CROSS/BLUE SHIELD | Attending: Internal Medicine | Admitting: Internal Medicine

## 2018-09-12 DIAGNOSIS — J9610 Chronic respiratory failure, unspecified whether with hypoxia or hypercapnia: Secondary | ICD-10-CM | POA: Diagnosis present

## 2018-09-12 DIAGNOSIS — Z6841 Body Mass Index (BMI) 40.0 and over, adult: Secondary | ICD-10-CM | POA: Diagnosis not present

## 2018-09-12 DIAGNOSIS — F250 Schizoaffective disorder, bipolar type: Secondary | ICD-10-CM | POA: Diagnosis not present

## 2018-09-12 DIAGNOSIS — I890 Lymphedema, not elsewhere classified: Secondary | ICD-10-CM | POA: Diagnosis not present

## 2018-09-12 DIAGNOSIS — E785 Hyperlipidemia, unspecified: Secondary | ICD-10-CM | POA: Diagnosis present

## 2018-09-12 DIAGNOSIS — Z7951 Long term (current) use of inhaled steroids: Secondary | ICD-10-CM

## 2018-09-12 DIAGNOSIS — Z1159 Encounter for screening for other viral diseases: Secondary | ICD-10-CM

## 2018-09-12 DIAGNOSIS — F319 Bipolar disorder, unspecified: Secondary | ICD-10-CM

## 2018-09-12 DIAGNOSIS — F29 Unspecified psychosis not due to a substance or known physiological condition: Secondary | ICD-10-CM | POA: Diagnosis not present

## 2018-09-12 DIAGNOSIS — Z9114 Patient's other noncompliance with medication regimen: Secondary | ICD-10-CM

## 2018-09-12 DIAGNOSIS — I110 Hypertensive heart disease with heart failure: Secondary | ICD-10-CM | POA: Diagnosis present

## 2018-09-12 DIAGNOSIS — Z9989 Dependence on other enabling machines and devices: Secondary | ICD-10-CM | POA: Diagnosis not present

## 2018-09-12 DIAGNOSIS — I509 Heart failure, unspecified: Secondary | ICD-10-CM

## 2018-09-12 DIAGNOSIS — Z7984 Long term (current) use of oral hypoglycemic drugs: Secondary | ICD-10-CM

## 2018-09-12 DIAGNOSIS — Z79890 Hormone replacement therapy (postmenopausal): Secondary | ICD-10-CM | POA: Diagnosis not present

## 2018-09-12 DIAGNOSIS — K219 Gastro-esophageal reflux disease without esophagitis: Secondary | ICD-10-CM | POA: Diagnosis present

## 2018-09-12 DIAGNOSIS — J9621 Acute and chronic respiratory failure with hypoxia: Secondary | ICD-10-CM | POA: Diagnosis not present

## 2018-09-12 DIAGNOSIS — F3113 Bipolar disorder, current episode manic without psychotic features, severe: Principal | ICD-10-CM | POA: Diagnosis present

## 2018-09-12 DIAGNOSIS — E038 Other specified hypothyroidism: Secondary | ICD-10-CM | POA: Diagnosis not present

## 2018-09-12 DIAGNOSIS — Z87440 Personal history of urinary (tract) infections: Secondary | ICD-10-CM | POA: Diagnosis not present

## 2018-09-12 DIAGNOSIS — J9622 Acute and chronic respiratory failure with hypercapnia: Secondary | ICD-10-CM | POA: Diagnosis not present

## 2018-09-12 DIAGNOSIS — R0902 Hypoxemia: Secondary | ICD-10-CM | POA: Diagnosis not present

## 2018-09-12 DIAGNOSIS — R7303 Prediabetes: Secondary | ICD-10-CM | POA: Diagnosis present

## 2018-09-12 DIAGNOSIS — I5032 Chronic diastolic (congestive) heart failure: Secondary | ICD-10-CM | POA: Diagnosis not present

## 2018-09-12 DIAGNOSIS — Z79899 Other long term (current) drug therapy: Secondary | ICD-10-CM | POA: Diagnosis not present

## 2018-09-12 DIAGNOSIS — J9612 Chronic respiratory failure with hypercapnia: Secondary | ICD-10-CM | POA: Diagnosis not present

## 2018-09-12 DIAGNOSIS — G4733 Obstructive sleep apnea (adult) (pediatric): Secondary | ICD-10-CM | POA: Diagnosis not present

## 2018-09-12 DIAGNOSIS — E213 Hyperparathyroidism, unspecified: Secondary | ICD-10-CM | POA: Diagnosis present

## 2018-09-12 DIAGNOSIS — I5033 Acute on chronic diastolic (congestive) heart failure: Secondary | ICD-10-CM | POA: Diagnosis not present

## 2018-09-12 DIAGNOSIS — F3189 Other bipolar disorder: Secondary | ICD-10-CM | POA: Diagnosis not present

## 2018-09-12 DIAGNOSIS — J9611 Chronic respiratory failure with hypoxia: Secondary | ICD-10-CM | POA: Diagnosis not present

## 2018-09-12 HISTORY — DX: Bipolar disorder, unspecified: F31.9

## 2018-09-12 LAB — SARS CORONAVIRUS 2 BY RT PCR (HOSPITAL ORDER, PERFORMED IN ~~LOC~~ HOSPITAL LAB): SARS Coronavirus 2: NEGATIVE

## 2018-09-12 MED ORDER — OLANZAPINE 5 MG PO TABS
5.0000 mg | ORAL_TABLET | Freq: Once | ORAL | Status: AC
  Administered 2018-09-12: 5 mg via ORAL
  Filled 2018-09-12: qty 1

## 2018-09-12 MED ORDER — OLANZAPINE 5 MG PO TBDP: 5.0000 mg | ORAL_TABLET | Freq: Every day | ORAL | Status: DC

## 2018-09-12 MED ORDER — POTASSIUM CHLORIDE CRYS ER 20 MEQ PO TBCR
40.0000 meq | EXTENDED_RELEASE_TABLET | Freq: Every day | ORAL | Status: DC
  Administered 2018-09-13 – 2018-09-14 (×2): 40 meq via ORAL
  Filled 2018-09-12 (×5): qty 2

## 2018-09-12 MED ORDER — ALUM & MAG HYDROXIDE-SIMETH 200-200-20 MG/5ML PO SUSP: 30.0000 mL | ORAL | Status: DC | PRN

## 2018-09-12 MED ORDER — MAGNESIUM HYDROXIDE 400 MG/5ML PO SUSP: 30.0000 mL | Freq: Every day | ORAL | Status: DC | PRN

## 2018-09-12 MED ORDER — NYSTATIN 100000 UNIT/GM EX POWD
Freq: Two times a day (BID) | CUTANEOUS | Status: DC
  Administered 2018-09-13 – 2018-09-14 (×4): via TOPICAL
  Filled 2018-09-12: qty 15

## 2018-09-12 MED ORDER — LORATADINE 10 MG PO TABS
10.0000 mg | ORAL_TABLET | Freq: Every day | ORAL | Status: DC
  Administered 2018-09-13 – 2018-09-14 (×2): 10 mg via ORAL
  Filled 2018-09-12 (×5): qty 1

## 2018-09-12 MED ORDER — CLONIDINE HCL 0.2 MG PO TABS
0.2000 mg | ORAL_TABLET | ORAL | Status: AC
  Administered 2018-09-12: 17:00:00 0.2 mg via ORAL
  Filled 2018-09-12: qty 2
  Filled 2018-09-12: qty 1

## 2018-09-12 MED ORDER — LEVOTHYROXINE SODIUM 75 MCG PO TABS
75.0000 ug | ORAL_TABLET | Freq: Every day | ORAL | Status: DC
  Administered 2018-09-13 – 2018-09-15 (×3): 75 ug via ORAL
  Filled 2018-09-12 (×5): qty 1

## 2018-09-12 MED ORDER — LORAZEPAM 1 MG PO TABS: 1.0000 mg | ORAL_TABLET | Freq: Once | ORAL | Status: DC

## 2018-09-12 MED ORDER — CLONIDINE HCL 0.2 MG PO TABS: 0.2000 mg | ORAL_TABLET | Freq: Two times a day (BID) | ORAL | Status: DC

## 2018-09-12 MED ORDER — FLUTICASONE PROPIONATE 50 MCG/ACT NA SUSP
2.0000 | Freq: Every day | NASAL | Status: DC
  Administered 2018-09-13 – 2018-09-14 (×2): 2 via NASAL
  Filled 2018-09-12: qty 16

## 2018-09-12 MED ORDER — OLANZAPINE 5 MG PO TBDP
5.0000 mg | ORAL_TABLET | ORAL | Status: AC
  Administered 2018-09-12: 17:00:00 5 mg via ORAL
  Filled 2018-09-12 (×2): qty 1

## 2018-09-12 MED ORDER — LORAZEPAM 1 MG PO TABS
1.0000 mg | ORAL_TABLET | ORAL | Status: DC
  Administered 2018-09-12 – 2018-09-15 (×12): 1 mg via ORAL
  Filled 2018-09-12 (×14): qty 1

## 2018-09-12 MED ORDER — LABETALOL HCL 300 MG PO TABS: 600.0000 mg | ORAL_TABLET | Freq: Two times a day (BID) | ORAL | Status: DC

## 2018-09-12 MED ORDER — HALOPERIDOL 5 MG PO TABS
5.0000 mg | ORAL_TABLET | Freq: Once | ORAL | Status: AC
  Administered 2018-09-12: 5 mg via ORAL
  Filled 2018-09-12: qty 1

## 2018-09-12 MED ORDER — LISINOPRIL 40 MG PO TABS
40.0000 mg | ORAL_TABLET | ORAL | Status: AC
  Administered 2018-09-12: 17:00:00 40 mg via ORAL
  Filled 2018-09-12: qty 2
  Filled 2018-09-12: qty 1

## 2018-09-12 MED ORDER — LORAZEPAM 1 MG PO TABS
2.0000 mg | ORAL_TABLET | Freq: Once | ORAL | Status: AC
  Administered 2018-09-12: 17:00:00 2 mg via ORAL

## 2018-09-12 MED ORDER — LABETALOL HCL 300 MG PO TABS
600.0000 mg | ORAL_TABLET | ORAL | Status: AC
  Filled 2018-09-12 (×2): qty 2

## 2018-09-12 MED ORDER — LORAZEPAM 1 MG PO TABS: 2.0000 mg | ORAL_TABLET | Freq: Once | ORAL | Status: DC

## 2018-09-12 MED ORDER — DIVALPROEX SODIUM 500 MG PO DR TAB
500.0000 mg | DELAYED_RELEASE_TABLET | Freq: Two times a day (BID) | ORAL | Status: DC
  Administered 2018-09-12 – 2018-09-13 (×2): 500 mg via ORAL
  Filled 2018-09-12 (×6): qty 1

## 2018-09-12 MED ORDER — LORAZEPAM 2 MG/ML IJ SOLN: 1.0000 mg | INTRAMUSCULAR | Status: DC

## 2018-09-12 MED ORDER — PREDNISONE 5 MG PO TABS
5.0000 mg | ORAL_TABLET | Freq: Every day | ORAL | Status: DC
  Administered 2018-09-13 – 2018-09-14 (×2): 5 mg via ORAL
  Filled 2018-09-12 (×5): qty 1

## 2018-09-12 MED ORDER — AMLODIPINE BESYLATE 10 MG PO TABS: 10.0000 mg | ORAL_TABLET | Freq: Every day | ORAL | Status: DC

## 2018-09-12 MED ORDER — LORAZEPAM 1 MG PO TABS
ORAL_TABLET | ORAL | Status: AC
  Filled 2018-09-12: qty 2

## 2018-09-12 MED ORDER — LISINOPRIL 40 MG PO TABS: 40.0000 mg | ORAL_TABLET | Freq: Every day | ORAL | Status: DC

## 2018-09-12 MED ORDER — ACETAMINOPHEN 325 MG PO TABS: 650.0000 mg | ORAL_TABLET | Freq: Four times a day (QID) | ORAL | Status: DC | PRN

## 2018-09-12 MED ORDER — AMLODIPINE BESYLATE 10 MG PO TABS
10.0000 mg | ORAL_TABLET | ORAL | Status: AC
  Administered 2018-09-12: 17:00:00 10 mg via ORAL
  Filled 2018-09-12: qty 1
  Filled 2018-09-12: qty 2

## 2018-09-12 MED ORDER — PANTOPRAZOLE SODIUM 40 MG PO TBEC
40.0000 mg | DELAYED_RELEASE_TABLET | Freq: Every day | ORAL | Status: DC
  Administered 2018-09-13 – 2018-09-14 (×2): 40 mg via ORAL
  Filled 2018-09-12 (×5): qty 1

## 2018-09-12 NOTE — ED Notes (Signed)
Sitting in room, nad, but increasingly talking, calling out to unknown people making hand gestures hand gestures.

## 2018-09-12 NOTE — ED Notes (Signed)
Dr Elijah Birk updated with pt's BP-hold blood pressure medications [facility]ORB

## 2018-09-12 NOTE — Progress Notes (Signed)
Admission note:  Patient is a 53 yo female that presented to ED with altered mental status.  Patient was brought in by EMS with an escort by [employer].  See assessment note for further information regarding her delusions.  It was reported from day shift that patient's BP is elevated and she has a stage II pressure ulcer on her buttocks.  Patient is sleeping sounding and is audibly snoring.  She ambulates with a walker.  She has bilateral edema lower legs.  Patient has a hx of LVH, sleep apnea, HTN GERD, glaucoma, diverticulitis, arrhythmia (tachycardia).  Patient's blood pressure earlier was 208/117.  It was retaken manually and result was 178/90.  Will inform PA of elevated BP.  Earlier, she denied any SI or HI.  She is a poor historian, as she rambles and states she is having a baby.  Patient UDS and ETOH was negative.

## 2018-09-12 NOTE — ED Notes (Signed)
Alert, nad, denies si/hi/avh at this time, appears confused at times.  Pt reports that she lives at home with her family and feel safe there

## 2018-09-12 NOTE — ED Notes (Signed)
Bed: JO87 Expected date:  Expected time:  Means of arrival:  Comments: Room 9
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
## 2018-09-12 NOTE — ED Notes (Signed)
Pt uncooperative. Pt refuses to speak to staff. Pt constantly redirected to remain in room. Pt repeatedly pushing bedside table into hall. Pt safe will continue to monitor.

## 2018-09-12 NOTE — Progress Notes (Signed)
1:1 note  Pt did become less agitated, but is still confused, disoriented, and unsteady. Pt has moved to her bedroom and is now resting. Pt sleeping now. Pt doesn't seem in distress from unlabored breathing and symmetrical chest expansion. Sitter within arms reach. 1:1 continues. Will continue to monitor. q68m safety checks implemented and continued.

## 2018-09-12 NOTE — BH Assessment (Signed)
[place] Assessment Progress Note  Per Patriciaann Clan, PA, this pt requires psychiatric hospitalization.  Leonia Reader, RN, [facility] has pre-assigned pt to [facility] Rm [id number] in anticipation of a discharge planned for later today.  Pt presents under IVC initiated by EDP [name] Fee, MD, and IVC documents have been faxed to [facility].  Pt's nurse, Narda Rutherford, has been notified, and agrees to call report to [phone].  Pt is to be transported via Event organiser when the time comes.   Jalene Mullet, [place] Coordinator [phone]

## 2018-09-12 NOTE — Progress Notes (Signed)
Initial 1:1  Pt was admitted to the unit; confused, non-redirectable, having word-salad, pitting edema bilaterally in her legs, elevated blood pressure, and is unsteady ambulating. Pt is unable to be assessed at this moment. Medications ordered for blood pressure and agitation. Pt is with sitter within arms reach. Pt has been provided a walker and ambulates but with care. Pt safe on the unit. Will continue to monitor.

## 2018-09-12 NOTE — ED Notes (Signed)
Pt changed into hospital gown and VS rechecked.

## 2018-09-12 NOTE — Progress Notes (Addendum)
CSW received call from [place] regarding a conversation she had with patient's minister Gretchen Short this morning. Of note, Ms. Renard Hamper stated to Narda Rutherford that she does not feel patient will be safe returning to the home where she is under the care of Ms. Black's son.   CSW sees that patient is being transferred from the ED to [facility] at this time. CSW contacted Tom with [place] disposition and gave verbal hand off of this information.  [name], LCSW Transitions of Care Department [facility] ED [phone]

## 2018-09-12 NOTE — ED Notes (Signed)
Up to the bathroom 

## 2018-09-12 NOTE — ED Notes (Signed)
Gretchen Short called and identified herself as pt's minister, and care giver, [phone]. She is aware that we can not give out information  She reported that her son is the pt's care giver at times and that she does not feel that the pt is safe at her home.  Ms Renard Hamper reports that her son has taken her phone, credit cards and keys away from her.  Pt also reports that the pt did take her medications Tuesday morning.

## 2018-09-12 NOTE — ED Notes (Addendum)
CSW updated concerning pt's care givers concern's that pt is not safe at her home.

## 2018-09-12 NOTE — Progress Notes (Addendum)
Patient ID: Madison Palmer, female   DOB: 1965/06/23, 53 y.o.   MRN: [id number]  Patient in search room for admission. Patient unable to answer assessment questions and does not appear oriented. Patient reading signs in admission room. Skin assessment complete by [facility] and Probation officer. Patient with white, moist growth between panis folds and edema to lower extremities bilaterally. Patient also had a stage two ulcer on her buttox that is scabbed. MD and NP made aware.  Vitals obtained by Probation officer and [facility] and BP/HR were elevated. NP made aware and medications ordered/administered. Patient was escorted to unit with walker and 1:1 initiated.

## 2018-09-12 NOTE — ED Notes (Signed)
Sitting in room, nad, calm

## 2018-09-12 NOTE — ED Notes (Signed)
Sitting in room, talking to unknown person

## 2018-09-12 NOTE — Progress Notes (Signed)
Psychoeducational Group Note  Date:  09/12/2018 Time:  2342  Group Topic/Focus:  Wrap-Up Group:   The focus of this group is to help patients review their daily goal of treatment and discuss progress on daily workbooks.  Participation Level: Did Not Attend  Participation Quality:  Not Applicable  Affect:  Not Applicable  Cognitive:  Not Applicable  Insight:  Not Applicable  Engagement in Group: Not Applicable  Additional Comments:  The patient did not attend group since she was asleep in her room at that time.   Archie Balboa S 09/12/2018, 11:42 PM

## 2018-09-12 NOTE — ED Notes (Signed)
Pt off unit to [facility] per MD. Pt calm, alert, confused, no s/s of distress. Pt DC information given to [employer] for [facility]. Pt on stretcher. Pt transported by ambulance and [employer].

## 2018-09-12 NOTE — ED Notes (Signed)
Charge RN, Mortimer Fries, said to move pt to TCU after TTS consult is complete

## 2018-09-12 NOTE — Progress Notes (Signed)
Received [name] from the main ED                                                                                                                                     Received Madison Palmer from the main ED, she was noted in her room raising her legs in the bed. Then she got up, screaming at intervals, pushing the over bed table in the hallway and difficult to redirect. She is having a difficult time following directions at the present time.

## 2018-09-12 NOTE — ED Notes (Signed)
Patriciaann Clan, PA, patient meets inpatient criteria. TTS to secure placement. Bailey Mech, RN, informed of disposition.

## 2018-09-13 ENCOUNTER — Other Ambulatory Visit: Payer: Self-pay

## 2018-09-13 ENCOUNTER — Encounter (HOSPITAL_COMMUNITY): Payer: Self-pay | Admitting: *Deleted

## 2018-09-13 ENCOUNTER — Ambulatory Visit (HOSPITAL_BASED_OUTPATIENT_CLINIC_OR_DEPARTMENT_OTHER): Payer: BLUE CROSS/BLUE SHIELD

## 2018-09-13 DIAGNOSIS — F319 Bipolar disorder, unspecified: Secondary | ICD-10-CM

## 2018-09-13 MED ORDER — DIVALPROEX SODIUM 250 MG PO DR TAB
250.0000 mg | DELAYED_RELEASE_TABLET | Freq: Two times a day (BID) | ORAL | Status: DC
  Administered 2018-09-13 – 2018-09-14 (×3): 250 mg via ORAL
  Filled 2018-09-13 (×6): qty 1

## 2018-09-13 MED ORDER — CLONIDINE HCL 0.2 MG PO TABS
0.2000 mg | ORAL_TABLET | Freq: Two times a day (BID) | ORAL | Status: DC
  Administered 2018-09-13 – 2018-09-15 (×4): 0.2 mg via ORAL
  Filled 2018-09-13 (×7): qty 1

## 2018-09-13 MED ORDER — PERPHENAZINE 4 MG PO TABS
4.0000 mg | ORAL_TABLET | Freq: Three times a day (TID) | ORAL | Status: DC
  Administered 2018-09-13 – 2018-09-14 (×5): 4 mg via ORAL
  Filled 2018-09-13 (×12): qty 1

## 2018-09-13 NOTE — Progress Notes (Signed)
1:1  Pt has been resting most of the day. Pt did urinate on herself in the dayroom. Pt was cleaned and redressed. Pt is becoming less confused. Pt in the dayroom now watching tv. Pt safe on the unit. q67m safety checks implemented and continued. 1:1 within arms reach. Will continue to monitor.

## 2018-09-13 NOTE — Progress Notes (Addendum)
Patient has severe sleep apnea.  Observed patient snoring with silent pauses.  O2 dropped to 83.  Informed Patriciaann Clan, PA of results.  The head of her bed was raised and patient appeared to be breathing comfortably.  Patient normally uses O2 2 liters to ambulate and sleep.  Patient's O2 taken again and reading started at 77 and went to 86.  Informed [place], [place].  Will continue to monitor O2 until patient wakes.
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
## 2018-09-13 NOTE — Progress Notes (Signed)
1:1 note - ( Late entry due to writer doing an admission) Patient is currently lying in bed asleep no distress noted. Writer spoke with patient at beginning of shift and she was talking about the numbers on the board in the dayroom and she knew what they meant. She reported she wants to go home. Writer informed her that her Education officer, museum is working on that for her. Her conversation was tangential and she seems confused. 1:1 continues for patient safety. Will continue to monitor.

## 2018-09-13 NOTE — Plan of Care (Signed)
1:1 note  Pt found in bed; compliant with medication administration. Pt is still tangential, having word salad, and fixated on jared, tonya, terrance, and rodney. Pt's lower extremity edema is still present but doesn't seem to have worsened. Foot of pt's bed was lifted to try and drain fluid. Pt denies any physical pain, but again is disorganized. Pt safe on the unit. Q4m safety checks implemented and continued.  Pt's sitter within arms reach. Pt provided encouragement, nourishment, and hydration. Medications provided per protocol and standing orders. Will continue to monitor.   Pt progressing in the following metrics  Problem: Education: Goal: Utilization of techniques to improve thought processes will improve Outcome: Not Progressing Goal: Knowledge of the prescribed therapeutic regimen will improve Outcome: Not Progressing   Problem: Activity: Goal: Interest or engagement in leisure activities will improve Outcome: Not Progressing

## 2018-09-13 NOTE — Progress Notes (Signed)
1:1 observation note:  Patient observed sleeping soundly.  Head of bed raised for patient comfort, as her O2 dips while sleeping. Patient is morbidly obese and has severe sleep apnea.  She will remain a 1:1 due to safety.  Patient is not able to take care of her personal hygiene and has altered mental status.
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
## 2018-09-13 NOTE — Progress Notes (Signed)
Recreation Therapy Notes  Date: 5.15.20 Time: 1000 Location: [address] Dayroom  Group Topic: Communication, Team Building, Problem Solving  Goal Area(s) Addresses:  Patient will effectively work with peer towards shared goal.  Patient will identify skill used to make activity successful.  Patient will identify how skills used during activity can be used to reach post d/c goals.   Intervention: STEM Activity   Activity:  Straw Bridge.  Patients placed in groups of 2.  Each group was given 20 straws and a long piece of masking tape.  Each group was to build a freestanding bridge that could hold a small puzzle box.  Education: Education officer, community, Dentist.   Education Outcome: Acknowledges education  Clinical Observations/Feedback:  Pt did not attend group.    Victorino Sparrow, LRT/CTRS         Victorino Sparrow A 09/13/2018 12:21 PM

## 2018-09-13 NOTE — Progress Notes (Signed)
1:1 observation note:  Patient has been sleeping since beginning of night shift.  She did ambulate to the bathroom once with assistance.  Patient needs assistance getting out of bed, and appeared to walking with her eyes closed.  She continues to exhibit altered mental status.  She needs assistance even with the walker.  Patient would benefit from physical therapy before discharge.  Unable to assess her depression or psychosis, as she would not answer any questions.  She did take her ativan and depakote that was scheduled.  Patient has severe sleep apnea, and is not using a C-PAP machine.  From previous notes, patient has used oxygen while asleep since her O2 sats can drop to the 80s.  Her BP was elevated earlier, and has since came down to an acceptable level.  It was also observed that patient is not taking care of her personal hygiene when she goes to the bathroom.

## 2018-09-13 NOTE — Tx Team (Signed)
Initial Treatment Plan 09/13/2018 2:20 AM Madison Palmer IDP:[id number]    PATIENT STRESSORS: Financial difficulties Health problems Medication change or noncompliance   PATIENT STRENGTHS: Average or above average intelligence Religious Affiliation   PATIENT IDENTIFIED PROBLEMS: Medication noncompliance  Multiple health issues  Reduced ability to complete ADLs  Psychosis  Possible placement in ANF             DISCHARGE CRITERIA:  Improved stabilization in mood, thinking, and/or behavior Medical problems require only outpatient monitoring Need for constant or close observation no longer present Reduction of life-threatening or endangering symptoms to within safe limits  PRELIMINARY DISCHARGE PLAN: Outpatient therapy Placement in alternative living arrangements  PATIENT/FAMILY INVOLVEMENT: This treatment plan has been presented to and reviewed with the patient, Madison Palmer.  The patient and family have been given the opportunity to ask questions and make suggestions.  Zipporah Plants, RN 09/13/2018, 2:20 AM

## 2018-09-13 NOTE — Tx Team (Addendum)
Interdisciplinary Treatment and Diagnostic Plan Update  09/13/2018 Time of Session: 0910 Madison Palmer MRN: [id number]  Principal Diagnosis: <principal problem not specified>  Secondary Diagnoses: Active Problems:   Bipolar 1 disorder (HCC)   Current Medications:  Current Facility-Administered Medications  Medication Dose Route Frequency Provider Last Rate Last Dose  . acetaminophen (TYLENOL) tablet 650 mg  650 mg Oral Q6H PRN Ethelene Hal, NP      . alum & mag hydroxide-simeth (MAALOX/MYLANTA) 200-200-20 MG/5ML suspension 30 mL  30 mL Oral Q4H PRN Ethelene Hal, NP      . cloNIDine (CATAPRES) tablet 0.2 mg  0.2 mg Oral BID Johnn Hai, MD      . divalproex (DEPAKOTE) DR tablet 250 mg  250 mg Oral BID Johnn Hai, MD      . fluticasone [facility]) 50 MCG/ACT nasal spray 2 spray  2 spray Each Nare Daily Ethelene Hal, NP   2 spray at 09/13/18 2010  . labetalol (NORMODYNE) tablet 600 mg  600 mg Oral NOW Ethelene Hal, NP      . levothyroxine (SYNTHROID) tablet 75 mcg  75 mcg Oral Q0600 Ethelene Hal, NP   75 mcg at 09/13/18 0558  . loratadine (CLARITIN) tablet 10 mg  10 mg Oral Daily Ethelene Hal, NP   10 mg at 09/13/18 0712  . LORazepam (ATIVAN) tablet 1 mg  1 mg Oral Q4H Johnn Hai, MD   1 mg at 09/13/18 1549   Or  . LORazepam (ATIVAN) injection 1 mg  1 mg Intramuscular Q4H Johnn Hai, MD      . magnesium hydroxide (MILK OF MAGNESIA) suspension 30 mL  30 mL Oral Daily PRN Ethelene Hal, NP      . nystatin (MYCOSTATIN/NYSTOP) topical powder   Topical BID Ethelene Hal, NP      . pantoprazole (PROTONIX) EC tablet 40 mg  40 mg Oral Daily Ethelene Hal, NP   40 mg at 09/13/18 0813  . perphenazine (TRILAFON) tablet 4 mg  4 mg Oral TID Johnn Hai, MD   4 mg at 09/13/18 1201  . potassium chloride SA (K-DUR) CR tablet 40 mEq  40 mEq Oral Daily Ethelene Hal, NP   40 mEq at 09/13/18 0813  . predniSONE  (DELTASONE) tablet 5 mg  5 mg Oral Daily Ethelene Hal, NP   5 mg at 09/13/18 1975   PTA Medications: Medications Prior to Admission  Medication Sig Dispense Refill Last Dose  . acetaminophen (TYLENOL) 500 MG tablet Take 1,000 mg by mouth 3 (three) times daily as needed for moderate pain or headache.   unknown  . amLODipine (NORVASC) 10 MG tablet Take 10 mg by mouth daily.   09/11/2018 at Unknown time  . benzonatate (TESSALON) 100 MG capsule Take 1 capsule (100 mg total) by mouth 3 (three) times daily as needed for cough. (Patient not taking: Reported on 08/19/2018) 20 capsule 0 Not Taking at Unknown time  . cetirizine (ZYRTEC ALLERGY) 10 MG tablet Take 1 tablet (10 mg total) by mouth daily. 30 tablet 5   . cloNIDine (CATAPRES) 0.2 MG tablet Take 0.2 mg by mouth 2 (two) times a day.   09/11/2018 at Unknown time  . divalproex (DEPAKOTE) 500 MG DR tablet Take 500 mg by mouth 2 (two) times a day.   09/11/2018 at Unknown time  . esomeprazole (NEXIUM) 40 MG capsule Take 1 capsule (40 mg total) by mouth 2 (two) times daily. 180 capsule  2   . fluticasone (FLONASE) 50 MCG/ACT nasal spray Place 2 sprays into both nostrils daily.   09/11/2018 at Unknown time  . furosemide (LASIX) 40 MG tablet Take 1 tablet (40 mg total) by mouth 2 (two) times daily. 180 tablet 1   . hydrALAZINE (APRESOLINE) 100 MG tablet Take 100 mg by mouth 3 (three) times daily.   09/11/2018 at Unknown time  . hydrochlorothiazide (HYDRODIURIL) 50 MG tablet Take 50 mg by mouth daily.     Marland Kitchen ibuprofen (ADVIL) 200 MG tablet Take 400 mg by mouth every 6 (six) hours as needed for moderate pain.     Marland Kitchen labetalol (NORMODYNE) 300 MG tablet Take 600 mg by mouth 2 (two) times a day.     . levothyroxine (SYNTHROID) 75 MCG tablet Take 75 mcg by mouth as directed. 5 days weekly     . lisinopril (ZESTRIL) 40 MG tablet Take 40 mg by mouth daily.     . Lurasidone HCl 60 MG TABS Take 60 mg by mouth daily after supper.   09/10/2018 at Unknown time  .  metFORMIN (GLUCOPHAGE) 500 MG tablet Take 500 mg by mouth 2 (two) times daily with a meal.   09/11/2018 at Unknown time  . potassium chloride SA (K-DUR,KLOR-CON) 20 MEQ tablet Take 2 tablets (40 mEq total) by mouth daily. 180 tablet 2   . predniSONE (DELTASONE) 5 MG tablet Take 1 tablet (5 mg total) by mouth daily. (Patient not taking: Reported on 09/12/2018) 60 tablet 3 Not Taking at Unknown time  . Vitamin D, Ergocalciferol, (DRISDOL) 1.25 MG (50000 UT) CAPS capsule TAKE 1 CAPSULE BY MOUTH EVERY 7 DAYS (Patient not taking: Reported on 09/12/2018) 12 capsule 0 Not Taking at Unknown time    Patient Stressors: Financial difficulties Health problems Medication change or noncompliance  Patient Strengths: Average or above average intelligence Religious Affiliation  Treatment Modalities: Medication Management, Group therapy, Case management,  1 to 1 session with clinician, Psychoeducation, Recreational therapy.   Physician Treatment Plan for Primary Diagnosis: <principal problem not specified> Long Term Goal(s): Improvement in symptoms so as ready for discharge Improvement in symptoms so as ready for discharge   Short Term Goals: Ability to disclose and discuss suicidal ideas Compliance with prescribed medications will improve  Medication Management: Evaluate patient's response, side effects, and tolerance of medication regimen.  Therapeutic Interventions: 1 to 1 sessions, Unit Group sessions and Medication administration.  Evaluation of Outcomes: Not Met  Physician Treatment Plan for Secondary Diagnosis: Active Problems:   Bipolar 1 disorder ([place])  Long Term Goal(s): Improvement in symptoms so as ready for discharge Improvement in symptoms so as ready for discharge   Short Term Goals: Ability to disclose and discuss suicidal ideas Compliance with prescribed medications will improve     Medication Management: Evaluate patient's response, side effects, and tolerance of medication  regimen.  Therapeutic Interventions: 1 to 1 sessions, Unit Group sessions and Medication administration.  Evaluation of Outcomes: Not Met   RN Treatment Plan for Primary Diagnosis: <principal problem not specified> Long Term Goal(s): Knowledge of disease and therapeutic regimen to maintain health will improve  Short Term Goals: Ability to identify and develop effective coping behaviors will improve and Compliance with prescribed medications will improve  Medication Management: RN will administer medications as ordered by provider, will assess and evaluate patient's response and provide education to patient for prescribed medication. RN will report any adverse and/or side effects to prescribing provider.  Therapeutic Interventions: 1 on 1 counseling sessions,  Psychoeducation, Medication administration, Evaluate responses to treatment, Monitor vital signs and CBGs as ordered, Perform/monitor CIWA, COWS, AIMS and Fall Risk screenings as ordered, Perform wound care treatments as ordered.  Evaluation of Outcomes: Not Met   LCSW Treatment Plan for Primary Diagnosis: <principal problem not specified> Long Term Goal(s): Safe transition to appropriate next level of care at discharge, Engage patient in therapeutic group addressing interpersonal concerns.  Short Term Goals: Engage patient in aftercare planning with referrals and resources, Increase social support and Increase skills for wellness and recovery  Therapeutic Interventions: Assess for all discharge needs, 1 to 1 time with Social worker, Explore available resources and support systems, Assess for adequacy in community support network, Educate family and significant other(s) on suicide prevention, Complete Psychosocial Assessment, Interpersonal group therapy.  Evaluation of Outcomes: Not Met   Progress in Treatment: Attending groups: No. Participating in groups: No. Taking medication as prescribed: Yes. Toleration medication:  Yes. Family/Significant other contact made: No, will contact:  when given permission Patient understands diagnosis: No. Discussing patient identified problems/goals with staff: Yes. Medical problems stabilized or resolved: Yes. Denies suicidal/homicidal ideation: Yes. Issues/concerns per patient self-inventory: No. Other: none  New problem(s) identified: No, Describe:  none  New Short Term/Long Term Goal(s):  Patient Goals:  Pt unable to state goal at this time.   Discharge Plan or Barriers:   Reason for Continuation of Hospitalization: Delusions  Medication stabilization  Estimated Length of Stay: 5-7 days  Attendees: Patient: Pt unable to participate 09/13/2018   Physician: Dr. Jake Samples, MD 09/13/2018   Nursing: Neldon Newport, RN 09/13/2018   RN Care Manager: 09/13/2018   Social Worker: Lurline Idol, LCSW 09/13/2018   Recreational Therapist:  09/13/2018   Other:  09/13/2018   Other:  09/13/2018   Other: 09/13/2018     Scribe for Treatment Team: Joanne Chars, [place] 09/13/2018 4:01 PM

## 2018-09-13 NOTE — Progress Notes (Signed)
1:1 observation note:  Patient sleeping.  She did awake to take her synthroid this am.  She will continue 1:1 for safety/altered mental status.  Patient's O2 sats have been dipping during the night due to her sleep apnea.

## 2018-09-13 NOTE — [facility] Suicide Risk Assessment (Signed)
[facility] Admission Suicide Risk Assessment   Nursing information obtained from:  Patient Demographic factors:  Unemployed Current Mental Status:  NA Loss Factors:  Decline in physical health, Loss of significant relationship Historical Factors:  Impulsivity Risk Reduction Factors:  NA  Total Time spent with patient: 45 minutes Principal Problem: Bipolar/schizoaffective Diagnosis:  Active Problems:   Bipolar 1 disorder (HCC)  Subjective Data: She is not fully oriented quite disorganized  Continued Clinical Symptoms:  Alcohol Use Disorder Identification Test Final Score (AUDIT): 0 The "Alcohol Use Disorders Identification Test", Guidelines for Use in Primary Care, Second Edition.  [employer] [facility]). Score between 0-7:  no or low risk or alcohol related problems. Score between 8-15:  moderate risk of alcohol related problems. Score between 16-19:  high risk of alcohol related problems. Score 20 or above:  warrants further diagnostic evaluation for alcohol dependence and treatment.   CLINICAL FACTORS:   Bipolar Disorder:   Mixed State   Musculoskeletal: Strength & Muscle Tone: decreased Gait & Station: broad based Patient leans: N/A  Psychiatric Specialty Exam: Physical Exam  ROS  Blood pressure (!) 165/81, pulse 96, temperature 98.1 F (36.7 C), temperature source Oral, resp. rate 18, height 5\' 2"  (1.575 m), weight (!) 149.7 kg, last menstrual period 05/02/2017, SpO2 90 %.Body mass index is 60.36 kg/m.  General Appearance: Disheveled  Eye Contact:  Minimal  Speech:  Slow  Volume:  Decreased  Mood:  Depressed and Dysphoric  Affect:  Congruent and Constricted  Thought Process:  Disorganized, Irrelevant and Descriptions of Associations: Circumstantial  Orientation:  Other:  Person only  Thought Content:  Illogical and Delusions  Suicidal Thoughts:  No  Homicidal Thoughts:  No  Memory:  Remote;   Fair  Judgement:  Impaired  Insight:  Lacking  Psychomotor  Activity:  Decreased and Flacid  Concentration:  Attention Span: Poor  Recall:  Poor  Fund of Knowledge:  Poor  Language:  Paucity some echolalia  Akathisia:  Negative  Handed:  Right  AIMS (if indicated):     Assets:  Resilience  ADL's:  Intact  Cognition:  WNL  Sleep:  Number of Hours: 6.75      COGNITIVE FEATURES THAT CONTRIBUTE TO RISK:  Loss of executive function    SUICIDE RISK:   Minimal: No identifiable suicidal ideation.  Patients presenting with no risk factors but with morbid ruminations; may be classified as minimal risk based on the severity of the depressive symptoms  PLAN OF CARE: Multiple med adjustments  I certify that inpatient services furnished can reasonably be expected to improve the patient's condition.   Johnn Hai, MD 09/13/2018, 11:44 AM

## 2018-09-14 NOTE — [facility] Group Notes (Signed)
[facility] LCSW Group Therapy Note  09/14/2018  10:00-11:00AM  Type of Therapy and Topic:  Group Therapy - Accepting We Are All Damaged People  Participation Level:  Did Not Attend   Description of Group:  Patients in this group were asked to share whether they feel that they are "damaged" and explain their responses.  A song entitled "Damaged People" was then played, followed by a discussion of the relevance/relatedness of this song to each patient.   The conclusion of the group was that our goal as humans does not need to be perfection, but rather growth.  Insights among group members were shared, including that it is easy to point the fingers at others as being damaged, but actually we need to realize that we also are flawed humans with problems to overcome.  The group concluded with an emphasis on how this is ultimately a message of hope that we face struggles like every other person in the world, and that we are not alone.  Therapeutic Goals: 1)  introduce the concept of pain and hardship being universal  2)  connect emotionally to a musical message and to other group members  3)  identify the patient's current beliefs about their own broken methods of resolving their life problems to date, specifically related to this hospitalization  4)  allow time and space for patients to vent their pain and receive support from other patients  5)  elicit hope that arises from realizing we are not alone in our human struggles   Summary of Patient Progress:  N/A  Therapeutic Modalities:   Motivational Interviewing Activity  Maretta Los  09/14/2018 8:42 AM

## 2018-09-14 NOTE — Progress Notes (Signed)
1:1 Nursing note- Patient currently lying in bed asleep no distress noted. 1:1 continues and patient is safe.

## 2018-09-14 NOTE — Progress Notes (Signed)
Patient currently lying in bed asleep, snoring. No distress noted. Patient has been up a few times over in the night to use the restroom. 1:1 continues and patient is safe. Will continue to monitor.

## 2018-09-14 NOTE — Progress Notes (Signed)
1:1 Nursing note- Patient was given a bath by her sitter and writer changed bed linen before patient laid back down. She reports having had a good day. She was sitting on the side of the bed speaking with her sitter. She recieved her scheduled medications. Safety maintained on unit with 15 min checks.1:1 continues and patient is safe.

## 2018-09-14 NOTE — Progress Notes (Signed)
Patient ID: Madison Palmer, female   DOB: 10-Mar-1966, 53 y.o.   MRN: [id number]  1:1 RN note  D: Pt awake, laying in her bed, with even and unlabored respirations. Pt ambulated in her room and hallway with front wheel walker and sitter's assistance. Pt received medications and sat in the day room shortly before returning to her room. Sitter is with pt. A: Pt remains on 1:1 for safety. R: Pt remains safe on the unit.

## 2018-09-14 NOTE — Progress Notes (Signed)
Patient ID: WELTHA CATHY, female   DOB: 28-May-1965, 53 y.o.   MRN: [id number]  1:1 RN note  D: Pt asleep in her bed with even and unlabored respirations. No signs of distress or discomfort noted. Sitter is with pt. A: Pt remains on 1:1 for safety. R: Pt remains safe on the unit.

## 2018-09-14 NOTE — Progress Notes (Addendum)
[facility] MD Progress Note  09/14/2018 11:39 AM Madison Palmer  MRN:  [id number]   Subjective: Patient reports that she feels better today.  Patient denies any suicidal or homicidal ideations and denies any hallucinations.  Patient states that she feels very tired if when it is clear "no".  Patient states that she has started to eat better.  Patient denies any substance abuse.  Objective: Patient's chart and findings reviewed and discussed with treatment team.  Patient presents in her room and is sitting on the bed.  Patient uses walker to ambulate to the office.  Patient is pleasant, calm, and cooperative.  When patient is asked questions she is tends to ramble and is tangential and does not answer questions directly.  Patient still believes that she is pregnant.  Patient rambles about someone else having 16 children and then another woman having a children and that she did not seem impressed with the woman only having a children.  Patient has disorganized thought.  As patient was walking back to the room she said the walker to the side because it frustrated her and she walked all the way back to her bed without assistance of a walker which is approximately 15 to 20 feet.  Principal Problem: Bipolar 1 disorder ([place]) Diagnosis: Principal Problem:   Bipolar 1 disorder ([place])  Total Time spent with patient: 20 minutes  Past Psychiatric History: See H&P  Past Medical History:  Past Medical History:  Diagnosis Date  . Anemia   . Anxiety   . Arrhythmia    tachycardia  . Arthritis   . Chickenpox   . Depression   . Diverticulitis   . GERD (gastroesophageal reflux disease)   . Glaucoma   . Hyperlipidemia   . Hyperparathyroidism ([place])   . Hypertension   . Inflammatory polyps of colon ([place])   . LVH (left ventricular hypertrophy)   . Lymphedema   . PCOS (polycystic ovarian syndrome)   . Prediabetes   . Recurrent UTI   . Sleep apnea    CPAP  . Thyroid disease   . Vitamin D deficiency      Past Surgical History:  Procedure Laterality Date  . BREAST BIOPSY  2015  . CESAREAN SECTION  2004  . INNER EAR SURGERY     ear and sinus surgery  . LAPAROSCOPIC REPAIR AND REMOVAL OF GASTRIC BAND    . OOPHORECTOMY Left   . TONSILLECTOMY AND ADENOIDECTOMY     Family History:  Family History  Adopted: Yes  Problem Relation Age of Onset  . Hearing loss Son        right   . Healthy Son    Family Psychiatric  History: See H&P Social History:  Social History   Substance and Sexual Activity  Alcohol Use Yes   Comment: social     Social History   Substance and Sexual Activity  Drug Use No    Social History   Socioeconomic History  . Marital status: Married    Spouse name: Not on file  . Number of children: 1  . Years of education: Not on file  . Highest education level: Not on file  Occupational History  . Occupation: Unemployed  Social Needs  . Financial resource strain: Not on file  . Food insecurity:    Worry: Not on file    Inability: Not on file  . Transportation needs:    Medical: Not on file    Non-medical: Not on file  Tobacco Use  .  Smoking status: Never Smoker  . Smokeless tobacco: Never Used  Substance and Sexual Activity  . Alcohol use: Yes    Comment: social  . Drug use: No  . Sexual activity: Not Currently  Lifestyle  . Physical activity:    Days per week: Not on file    Minutes per session: Not on file  . Stress: Not on file  Relationships  . Social connections:    Talks on phone: Not on file    Gets together: Not on file    Attends religious service: Not on file    Active member of club or organization: Not on file    Attends meetings of clubs or organizations: Not on file    Relationship status: Not on file  Other Topics Concern  . Not on file  Social History Narrative   Lives with son and companion.     Additional Social History:                         Sleep: Good  Appetite:  Fair  Current Medications: Current  Facility-Administered Medications  Medication Dose Route Frequency Provider Last Rate Last Dose  . acetaminophen (TYLENOL) tablet 650 mg  650 mg Oral Q6H PRN Ethelene Hal, NP      . alum & mag hydroxide-simeth (MAALOX/MYLANTA) 200-200-20 MG/5ML suspension 30 mL  30 mL Oral Q4H PRN Ethelene Hal, NP      . cloNIDine (CATAPRES) tablet 0.2 mg  0.2 mg Oral BID Johnn Hai, MD   0.2 mg at 09/14/18 0846  . divalproex (DEPAKOTE) DR tablet 250 mg  250 mg Oral BID Johnn Hai, MD   250 mg at 09/14/18 0846  . fluticasone (FLONASE) 50 MCG/ACT nasal spray 2 spray  2 spray Each Nare Daily Ethelene Hal, NP   2 spray at 09/14/18 0845  . levothyroxine (SYNTHROID) tablet 75 mcg  75 mcg Oral Q0600 Ethelene Hal, NP   75 mcg at 09/14/18 7169  . loratadine (CLARITIN) tablet 10 mg  10 mg Oral Daily Ethelene Hal, NP   10 mg at 09/14/18 0845  . LORazepam (ATIVAN) tablet 1 mg  1 mg Oral Q4H Johnn Hai, MD   1 mg at 09/14/18 0845   Or  . LORazepam (ATIVAN) injection 1 mg  1 mg Intramuscular Q4H Johnn Hai, MD      . magnesium hydroxide (MILK OF MAGNESIA) suspension 30 mL  30 mL Oral Daily PRN Ethelene Hal, NP      . nystatin (MYCOSTATIN/NYSTOP) topical powder   Topical BID Ethelene Hal, NP      . pantoprazole (PROTONIX) EC tablet 40 mg  40 mg Oral Daily Ethelene Hal, NP   40 mg at 09/14/18 0847  . perphenazine (TRILAFON) tablet 4 mg  4 mg Oral TID Johnn Hai, MD   4 mg at 09/14/18 0846  . potassium chloride SA (K-DUR) CR tablet 40 mEq  40 mEq Oral Daily Ethelene Hal, NP   40 mEq at 09/14/18 0846  . predniSONE (DELTASONE) tablet 5 mg  5 mg Oral Daily Ethelene Hal, NP   5 mg at 09/14/18 6789    Lab Results:  Results for orders placed or performed during the hospital encounter of 09/11/18 (from the past 48 hour(s))  SARS Coronavirus 2 (CEPHEID - Performed in [facility] hospital lab), [facility] Order     Status: None   Collection  Time: 09/12/18  1:39 PM  Result Value Ref Range   SARS Coronavirus 2 NEGATIVE NEGATIVE    Comment: (NOTE) If result is NEGATIVE SARS-CoV-2 target nucleic acids are NOT DETECTED. The SARS-CoV-2 RNA is generally detectable in upper and lower  respiratory specimens during the acute phase of infection. The lowest  concentration of SARS-CoV-2 viral copies this assay can detect is 250  copies / mL. A negative result does not preclude SARS-CoV-2 infection  and should not be used as the sole basis for treatment or other  patient management decisions.  A negative result may occur with  improper specimen collection / handling, submission of specimen other  than nasopharyngeal swab, presence of viral mutation(s) within the  areas targeted by this assay, and inadequate number of viral copies  (<250 copies / mL). A negative result must be combined with clinical  observations, patient history, and epidemiological information. If result is POSITIVE SARS-CoV-2 target nucleic acids are DETECTED. The SARS-CoV-2 RNA is generally detectable in upper and lower  respiratory specimens dur ing the acute phase of infection.  Positive  results are indicative of active infection with SARS-CoV-2.  Clinical  correlation with patient history and other diagnostic information is  necessary to determine patient infection status.  Positive results do  not rule out bacterial infection or co-infection with other viruses. If result is PRESUMPTIVE POSTIVE SARS-CoV-2 nucleic acids MAY BE PRESENT.   A presumptive positive result was obtained on the submitted specimen  and confirmed on repeat testing.  While 2019 novel coronavirus  (SARS-CoV-2) nucleic acids may be present in the submitted sample  additional confirmatory testing may be necessary for epidemiological  and / or clinical management purposes  to differentiate between  SARS-CoV-2 and other Sarbecovirus currently known to infect humans.  If clinically indicated  additional testing with an alternate test  methodology [phone]) is advised. The SARS-CoV-2 RNA is generally  detectable in upper and lower respiratory sp ecimens during the acute  phase of infection. The expected result is Negative. Fact Sheet for Patients:  StrictlyIdeas.no Fact Sheet for Healthcare Providers: BankingDealers.co.za This test is not yet approved or cleared by the [place] FDA and has been authorized for detection and/or diagnosis of SARS-CoV-2 by FDA under an Emergency Use Authorization (EUA).  This EUA will remain in effect (meaning this test can be used) for the duration of the COVID-19 declaration under Section 564(b)(1) of the Act, 21 U.S.C. section 360bbb-3(b)(1), unless the authorization is terminated or revoked sooner. Performed at [facility], [place] [address], [place], [place] [address]     Blood Alcohol level:  Lab Results  Component Value Date   ETH <10 09/11/2018   ETH <10 70/35/0093    Metabolic Disorder Labs: Lab Results  Component Value Date   HGBA1C 6.0 (H) 02/27/2018   No results found for: PROLACTIN Lab Results  Component Value Date   CHOL 196 02/27/2018   TRIG 142 02/27/2018   HDL 46 02/27/2018   CHOLHDL 5 09/10/2017   VLDL 26.6 09/10/2017   LDLCALC 122 (H) 02/27/2018   LDLCALC 142 (H) 09/10/2017    Physical Findings: AIMS: Facial and Oral Movements Muscles of Facial Expression: None, normal Lips and Perioral Area: None, normal Jaw: None, normal Tongue: None, normal,Extremity Movements Upper (arms, wrists, hands, fingers): None, normal Lower (legs, knees, ankles, toes): None, normal, Trunk Movements Neck, shoulders, hips: None, normal, Overall Severity Severity of abnormal movements (highest score from questions above): None, normal Incapacitation due to abnormal movements: None, normal  Patient's awareness of abnormal movements (rate only patient's report):  No Awareness, Dental Status Current problems with teeth and/or dentures?: No Does patient usually wear dentures?: No  CIWA:    COWS:     Musculoskeletal: Strength & Muscle Tone: within normal limits Gait & Station: normal Patient leans: N/A  Psychiatric Specialty Exam: Physical Exam  Nursing note and vitals reviewed. Constitutional: She is oriented to person, place, and time. She appears well-developed and well-nourished.  Respiratory: Effort normal.  Musculoskeletal: Normal range of motion.  Neurological: She is alert and oriented to person, place, and time.  Skin: Skin is warm.    Review of Systems  Constitutional: Negative.   HENT: Negative.   Eyes: Negative.   Respiratory: Negative.   Cardiovascular: Negative.   Gastrointestinal: Negative.   Genitourinary: Negative.   Musculoskeletal: Negative.   Skin: Negative.   Neurological: Negative.   Endo/Heme/Allergies: Negative.   Psychiatric/Behavioral: Positive for depression and hallucinations.    Blood pressure (!) 180/121, pulse (!) 111, temperature 98.9 F (37.2 C), temperature source Oral, resp. rate 18, height 5\' 2"  (1.575 m), weight (!) 149.7 kg, last menstrual period 05/02/2017, SpO2 93 %.Body mass index is 60.36 kg/m.  General Appearance: Casual  Eye Contact:  Fair  Speech:  Slow  Volume:  Decreased  Mood:  Depressed  Affect:  Depressed and Flat  Thought Process:  Disorganized and Irrelevant  Orientation:  Full (Time, Place, and Person)  Thought Content:  Delusions  Suicidal Thoughts:  No  Homicidal Thoughts:  No  Memory:  Immediate;   Poor Recent;   Poor Remote;   Poor  Judgement:  Impaired  Insight:  Lacking  Psychomotor Activity:  Normal  Concentration:  Concentration: Poor and Attention Span: Poor  Recall:  [place] of Knowledge:  Fair  Language:  Good  Akathisia:  No  Handed:  Right  AIMS (if indicated):     Assets:  Desire for Improvement Financial Resources/Insurance Housing Physical  Health Social Support Transportation  ADL's:  Intact  Cognition:  WNL  Sleep:  Number of Hours: 4.5   Problems addressed Bipolar 1 disorder  Treatment Plan Summary: Daily contact with patient to assess and evaluate symptoms and progress in treatment, Medication management and Plan is to: Continue clonidine 0.2 mg p.o. twice daily Continue Depakote 250 mg p.o. twice daily for mood stability Continue perphenazine 4 mg p.o. 3 times daily for psychosis Continue Ativan 1 mg p.o. every 4 hours Continue every 15 minute safety checks Encourage group therapy participation Continue one-to-one sitter for safety  Lewis Shock, FNP 09/14/2018, 11:39 AM   Attest to NP Progress Note

## 2018-09-14 NOTE — Progress Notes (Signed)
Patient ID: Madison Palmer, female   DOB: November 22, 1965, 53 y.o.   MRN: [id number]   1:1 RN note  D: Pt asleep in her bed with even and unlabored respirations. Pt ambulated several times with sitter's assistance to void in the bathroom. No signs of distress or discomfort noted. Sitter is with pt. A: Pt remains on 1:1 for safety. R: Pt remains safe on the unit.

## 2018-09-14 NOTE — [facility] Counselor (Signed)
CSW made two attempts to speak with pt to complete assessment and obtain consent to speak to family/friend for collateral information. Pt remains asleep this afternoon and snoring heavily. CSW will attempt again in the morning. Boyce Medici RN provided contact number for family friend that had called the hospital and provided some additional information--Tonya Coats [phone] will also attempt to get consent signed by patient allowing contact with this person in order to obtain more collateral information.   Harvin Konicek S. Ouida Sills, MSW, LCSW Clinical Social Worker 09/14/2018 2:19 PM

## 2018-09-14 NOTE — Plan of Care (Signed)
  Problem: Activity: Goal: Interest or engagement in leisure activities will improve Outcome: Not Progressing  D: Pt alert and oriented on the unit. Pt denies SI/HI, A/VH. Pt's affect was flat during interaction with RN staff. Pt bathed this morning with assistance of MHT. Pt did not attend unit groups and activities. Pt is cooperative. A: Education, support and encouragement provided, q15 minute safety checks remain in effect. Medications administered per MD orders. R: No reactions/side effects to medicine noted. Pt denies any concerns at this time and remains safe on the unit.

## 2018-09-14 NOTE — Progress Notes (Addendum)
Patient ID: Madison Palmer, female   DOB: 10-10-1965, 53 y.o.   MRN: [id number]  [place] NOVEL CORONAVIRUS (COVID-19) DAILY CHECK-OFF SYMPTOMS - answer yes or no to each - every day NO YES  Have you had a fever in the past 24 hours?  . Fever (Temp > 37.80C / 100F) X   Have you had any of these symptoms in the past 24 hours? . New Cough .  Sore Throat  .  Shortness of Breath .  Difficulty Breathing .  Unexplained Body Aches   X   Have you had any one of these symptoms in the past 24 hours not related to allergies?   . Runny Nose .  Nasal Congestion .  Sneezing   X   If you have had runny nose, nasal congestion, sneezing in the past 24 hours, has it worsened?  X   EXPOSURES - check yes or no X   Have you traveled outside the state in the past 14 days?  X   Have you been in contact with someone with a confirmed diagnosis of COVID-19 or PUI in the past 14 days without wearing appropriate PPE?  X   Have you been living in the same home as a person with confirmed diagnosis of COVID-19 or a PUI (household contact)?    X   Have you been diagnosed with COVID-19?    X              What to do next: Answered NO to all: Answered YES to anything:   Proceed with unit schedule Follow the [facility] Inpatient Flowsheet.

## 2018-09-14 NOTE — Progress Notes (Signed)
Patient ID: Madison Palmer, female   DOB: 23-Sep-1965, 53 y.o.   MRN: [id number]   [place] NOVEL CORONAVIRUS (COVID-19) DAILY CHECK-OFF SYMPTOMS - answer yes or no to each - every day NO YES  Have you had a fever in the past 24 hours?  . Fever (Temp > 37.80C / 100F) X   Have you had any of these symptoms in the past 24 hours? . New Cough .  Sore Throat  .  Shortness of Breath .  Difficulty Breathing .  Unexplained Body Aches   X   Have you had any one of these symptoms in the past 24 hours not related to allergies?   . Runny Nose .  Nasal Congestion .  Sneezing   X   If you have had runny nose, nasal congestion, sneezing in the past 24 hours, has it worsened?  X   EXPOSURES - check yes or no X   Have you traveled outside the state in the past 14 days?  X   Have you been in contact with someone with a confirmed diagnosis of COVID-19 or PUI in the past 14 days without wearing appropriate PPE?  X   Have you been living in the same home as a person with confirmed diagnosis of COVID-19 or a PUI (household contact)?    X   Have you been diagnosed with COVID-19?    X              What to do next: Answered NO to all: Answered YES to anything:   Proceed with unit schedule Follow the [facility] Inpatient Flowsheet.
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
## 2018-09-15 ENCOUNTER — Encounter (HOSPITAL_COMMUNITY): Payer: Self-pay

## 2018-09-15 ENCOUNTER — Inpatient Hospital Stay (HOSPITAL_COMMUNITY): Payer: BLUE CROSS/BLUE SHIELD

## 2018-09-15 ENCOUNTER — Inpatient Hospital Stay (HOSPITAL_COMMUNITY)
Admission: AD | Admit: 2018-09-15 | Discharge: 2018-09-25 | DRG: 208 | Disposition: A | Discharge status: Home / Self Care | Payer: BLUE CROSS/BLUE SHIELD | Source: Ambulatory Visit | Attending: Internal Medicine | Admitting: Internal Medicine

## 2018-09-15 ENCOUNTER — Other Ambulatory Visit: Payer: Self-pay

## 2018-09-15 DIAGNOSIS — E282 Polycystic ovarian syndrome: Secondary | ICD-10-CM | POA: Diagnosis present

## 2018-09-15 DIAGNOSIS — Z881 Allergy status to other antibiotic agents status: Secondary | ICD-10-CM

## 2018-09-15 DIAGNOSIS — Z9114 Patient's other noncompliance with medication regimen: Secondary | ICD-10-CM

## 2018-09-15 DIAGNOSIS — J9622 Acute and chronic respiratory failure with hypercapnia: Secondary | ICD-10-CM

## 2018-09-15 DIAGNOSIS — E038 Other specified hypothyroidism: Secondary | ICD-10-CM

## 2018-09-15 DIAGNOSIS — Z7984 Long term (current) use of oral hypoglycemic drugs: Secondary | ICD-10-CM

## 2018-09-15 DIAGNOSIS — E209 Hypoparathyroidism, unspecified: Secondary | ICD-10-CM | POA: Diagnosis present

## 2018-09-15 DIAGNOSIS — E785 Hyperlipidemia, unspecified: Secondary | ICD-10-CM | POA: Diagnosis present

## 2018-09-15 DIAGNOSIS — Z79890 Hormone replacement therapy (postmenopausal): Secondary | ICD-10-CM

## 2018-09-15 DIAGNOSIS — I5033 Acute on chronic diastolic (congestive) heart failure: Secondary | ICD-10-CM | POA: Diagnosis present

## 2018-09-15 DIAGNOSIS — F259 Schizoaffective disorder, unspecified: Secondary | ICD-10-CM | POA: Diagnosis present

## 2018-09-15 DIAGNOSIS — Z87440 Personal history of urinary (tract) infections: Secondary | ICD-10-CM

## 2018-09-15 DIAGNOSIS — Z79899 Other long term (current) drug therapy: Secondary | ICD-10-CM

## 2018-09-15 DIAGNOSIS — J9621 Acute and chronic respiratory failure with hypoxia: Secondary | ICD-10-CM | POA: Diagnosis present

## 2018-09-15 DIAGNOSIS — F319 Bipolar disorder, unspecified: Secondary | ICD-10-CM | POA: Diagnosis present

## 2018-09-15 DIAGNOSIS — I5032 Chronic diastolic (congestive) heart failure: Secondary | ICD-10-CM

## 2018-09-15 DIAGNOSIS — I272 Pulmonary hypertension, unspecified: Secondary | ICD-10-CM | POA: Diagnosis present

## 2018-09-15 DIAGNOSIS — J9610 Chronic respiratory failure, unspecified whether with hypoxia or hypercapnia: Secondary | ICD-10-CM | POA: Diagnosis present

## 2018-09-15 DIAGNOSIS — Z6841 Body Mass Index (BMI) 40.0 and over, adult: Secondary | ICD-10-CM

## 2018-09-15 DIAGNOSIS — F250 Schizoaffective disorder, bipolar type: Secondary | ICD-10-CM

## 2018-09-15 DIAGNOSIS — K219 Gastro-esophageal reflux disease without esophagitis: Secondary | ICD-10-CM | POA: Diagnosis present

## 2018-09-15 DIAGNOSIS — F419 Anxiety disorder, unspecified: Secondary | ICD-10-CM | POA: Diagnosis present

## 2018-09-15 DIAGNOSIS — H409 Unspecified glaucoma: Secondary | ICD-10-CM | POA: Diagnosis present

## 2018-09-15 DIAGNOSIS — E039 Hypothyroidism, unspecified: Secondary | ICD-10-CM | POA: Diagnosis present

## 2018-09-15 DIAGNOSIS — Z791 Long term (current) use of non-steroidal anti-inflammatories (NSAID): Secondary | ICD-10-CM

## 2018-09-15 DIAGNOSIS — I890 Lymphedema, not elsewhere classified: Secondary | ICD-10-CM

## 2018-09-15 DIAGNOSIS — I110 Hypertensive heart disease with heart failure: Secondary | ICD-10-CM | POA: Diagnosis present

## 2018-09-15 DIAGNOSIS — G4733 Obstructive sleep apnea (adult) (pediatric): Secondary | ICD-10-CM

## 2018-09-15 DIAGNOSIS — Z8719 Personal history of other diseases of the digestive system: Secondary | ICD-10-CM

## 2018-09-15 DIAGNOSIS — I160 Hypertensive urgency: Secondary | ICD-10-CM | POA: Diagnosis present

## 2018-09-15 DIAGNOSIS — E213 Hyperparathyroidism, unspecified: Secondary | ICD-10-CM | POA: Diagnosis present

## 2018-09-15 DIAGNOSIS — Z9989 Dependence on other enabling machines and devices: Secondary | ICD-10-CM

## 2018-09-15 DIAGNOSIS — R918 Other nonspecific abnormal finding of lung field: Secondary | ICD-10-CM | POA: Diagnosis present

## 2018-09-15 DIAGNOSIS — Z888 Allergy status to other drugs, medicaments and biological substances status: Secondary | ICD-10-CM

## 2018-09-15 DIAGNOSIS — E079 Disorder of thyroid, unspecified: Secondary | ICD-10-CM | POA: Insufficient documentation

## 2018-09-15 DIAGNOSIS — E662 Morbid (severe) obesity with alveolar hypoventilation: Principal | ICD-10-CM | POA: Diagnosis present

## 2018-09-15 DIAGNOSIS — Z7951 Long term (current) use of inhaled steroids: Secondary | ICD-10-CM

## 2018-09-15 HISTORY — DX: Acute and chronic respiratory failure with hypoxia: J96.21

## 2018-09-15 LAB — POCT I-STAT 7, (LYTES, BLD GAS, ICA,H+H)
Acid-Base Excess: 9 mmol/L — ABNORMAL HIGH (ref 0.0–2.0)
Bicarbonate: 36.6 mmol/L — ABNORMAL HIGH (ref 20.0–28.0)
Calcium, Ion: 1.39 mmol/L (ref 1.15–1.40)
HCT: 38 % (ref 36.0–46.0)
Hemoglobin: 12.9 g/dL (ref 12.0–15.0)
O2 Saturation: 100 %
Potassium: 3.7 mmol/L (ref 3.5–5.1)
Sodium: 140 mmol/L (ref 135–145)
TCO2: 39 mmol/L — ABNORMAL HIGH (ref 22–32)
pCO2 arterial: 64.9 mmHg — ABNORMAL HIGH (ref 32.0–48.0)
pH, Arterial: 7.359 (ref 7.350–7.450)
pO2, Arterial: 195 mmHg — ABNORMAL HIGH (ref 83.0–108.0)

## 2018-09-15 LAB — CBC WITH DIFFERENTIAL/PLATELET
Abs Immature Granulocytes: 0.04 10*3/uL (ref 0.00–0.07)
Basophils Absolute: 0 10*3/uL (ref 0.0–0.1)
Basophils Relative: 1 %
Eosinophils Absolute: 0.1 10*3/uL (ref 0.0–0.5)
Eosinophils Relative: 3 %
HCT: 43.7 % (ref 36.0–46.0)
Hemoglobin: 13.7 g/dL (ref 12.0–15.0)
Immature Granulocytes: 1 %
Lymphocytes Relative: 25 %
Lymphs Abs: 1.3 10*3/uL (ref 0.7–4.0)
MCH: 29 pg (ref 26.0–34.0)
MCHC: 31.4 g/dL (ref 30.0–36.0)
MCV: 92.6 fL (ref 80.0–100.0)
Monocytes Absolute: 0.5 10*3/uL (ref 0.1–1.0)
Monocytes Relative: 10 %
Neutro Abs: 3 10*3/uL (ref 1.7–7.7)
Neutrophils Relative %: 60 %
Platelets: 204 10*3/uL (ref 150–400)
RBC: 4.72 MIL/uL (ref 3.87–5.11)
RDW: 13.8 % (ref 11.5–15.5)
WBC: 5 10*3/uL (ref 4.0–10.5)
nRBC: 0 % (ref 0.0–0.2)

## 2018-09-15 LAB — TROPONIN I
Troponin I: 0.03 ng/mL
Troponin I: 0.03 ng/mL

## 2018-09-15 LAB — BRAIN NATRIURETIC PEPTIDE: B Natriuretic Peptide: 147.2 pg/mL — ABNORMAL HIGH (ref 0.0–100.0)

## 2018-09-15 LAB — COMPREHENSIVE METABOLIC PANEL
ALT: 58 U/L — ABNORMAL HIGH (ref 0–44)
AST: 26 U/L (ref 15–41)
Albumin: 3.3 g/dL — ABNORMAL LOW (ref 3.5–5.0)
Alkaline Phosphatase: 73 U/L (ref 38–126)
Anion gap: 11 (ref 5–15)
BUN: 8 mg/dL (ref 6–20)
CO2: 33 mmol/L — ABNORMAL HIGH (ref 22–32)
Calcium: 10.2 mg/dL (ref 8.9–10.3)
Chloride: 100 mmol/L (ref 98–111)
Creatinine, Ser: 1.12 mg/dL — ABNORMAL HIGH (ref 0.44–1.00)
GFR calc Af Amer: >60 mL/min (ref >60)
GFR calc non Af Amer: 56 mL/min — ABNORMAL LOW (ref >60)
Glucose, Bld: 101 mg/dL — ABNORMAL HIGH (ref 70–99)
Potassium: 3.8 mmol/L (ref 3.5–5.1)
Sodium: 144 mmol/L (ref 135–145)
Total Bilirubin: 1.2 mg/dL (ref 0.3–1.2)
Total Protein: 5.9 g/dL — ABNORMAL LOW (ref 6.5–8.1)

## 2018-09-15 LAB — D-DIMER, QUANTITATIVE: D-Dimer, Quant: 0.57 ug{FEU}/mL — ABNORMAL HIGH (ref 0.00–0.50)

## 2018-09-15 LAB — SARS CORONAVIRUS 2 BY RT PCR (HOSPITAL ORDER, PERFORMED IN ~~LOC~~ HOSPITAL LAB): SARS Coronavirus 2: NEGATIVE

## 2018-09-15 LAB — LACTIC ACID, PLASMA: Lactic Acid, Venous: 1.1 mmol/L (ref 0.5–1.9)

## 2018-09-15 MED ORDER — FLUTICASONE PROPIONATE 50 MCG/ACT NA SUSP
2.0000 | Freq: Every day | NASAL | Status: DC
  Administered 2018-09-16 – 2018-09-25 (×10): 2 via NASAL
  Filled 2018-09-15: qty 16

## 2018-09-15 MED ORDER — ONDANSETRON HCL 4 MG/2ML IJ SOLN: 4.0000 mg | Freq: Four times a day (QID) | INTRAMUSCULAR | Status: DC | PRN

## 2018-09-15 MED ORDER — PERPHENAZINE 4 MG PO TABS
4.0000 mg | ORAL_TABLET | Freq: Three times a day (TID) | ORAL | Status: DC
  Administered 2018-09-15 – 2018-09-16 (×2): 4 mg via ORAL
  Filled 2018-09-15 (×4): qty 1

## 2018-09-15 MED ORDER — ONDANSETRON HCL 4 MG PO TABS: 4.0000 mg | ORAL_TABLET | Freq: Four times a day (QID) | ORAL | Status: DC | PRN

## 2018-09-15 MED ORDER — FUROSEMIDE 40 MG PO TABS
40.0000 mg | ORAL_TABLET | Freq: Two times a day (BID) | ORAL | Status: DC
  Administered 2018-09-16 – 2018-09-21 (×11): 40 mg via ORAL
  Filled 2018-09-15 (×11): qty 1

## 2018-09-15 MED ORDER — LABETALOL HCL 200 MG PO TABS
600.0000 mg | ORAL_TABLET | Freq: Two times a day (BID) | ORAL | Status: DC
  Filled 2018-09-15: qty 3

## 2018-09-15 MED ORDER — ACETAMINOPHEN 325 MG PO TABS: 650.0000 mg | ORAL_TABLET | Freq: Four times a day (QID) | ORAL | Status: DC | PRN

## 2018-09-15 MED ORDER — DIVALPROEX SODIUM 250 MG PO DR TAB: 500.0000 mg | DELAYED_RELEASE_TABLET | Freq: Two times a day (BID) | ORAL | Status: DC

## 2018-09-15 MED ORDER — LORAZEPAM 1 MG PO TABS
1.0000 mg | ORAL_TABLET | ORAL | Status: DC | PRN
  Administered 2018-09-16 – 2018-09-24 (×12): 1 mg via ORAL
  Filled 2018-09-15 (×12): qty 1

## 2018-09-15 MED ORDER — ENOXAPARIN SODIUM 80 MG/0.8ML ~~LOC~~ SOLN
0.5000 mg/kg | SUBCUTANEOUS | Status: DC
  Administered 2018-09-15 – 2018-09-19 (×5): 75 mg via SUBCUTANEOUS
  Filled 2018-09-15 (×5): qty 0.8

## 2018-09-15 MED ORDER — HYDRALAZINE HCL 50 MG PO TABS
100.0000 mg | ORAL_TABLET | Freq: Three times a day (TID) | ORAL | Status: DC
  Administered 2018-09-15 – 2018-09-25 (×28): 100 mg via ORAL
  Filled 2018-09-15 (×29): qty 2

## 2018-09-15 MED ORDER — IPRATROPIUM-ALBUTEROL 0.5-2.5 (3) MG/3ML IN SOLN: 3.0000 mL | Freq: Four times a day (QID) | RESPIRATORY_TRACT | Status: DC | PRN

## 2018-09-15 MED ORDER — LURASIDONE HCL 20 MG PO TABS
60.0000 mg | ORAL_TABLET | Freq: Every day | ORAL | Status: DC
  Filled 2018-09-15: qty 3

## 2018-09-15 MED ORDER — CLONIDINE HCL 0.2 MG PO TABS
0.2000 mg | ORAL_TABLET | Freq: Two times a day (BID) | ORAL | Status: DC
  Administered 2018-09-15 – 2018-09-25 (×20): 0.2 mg via ORAL
  Filled 2018-09-15 (×20): qty 1

## 2018-09-15 MED ORDER — DIVALPROEX SODIUM 250 MG PO DR TAB
250.0000 mg | DELAYED_RELEASE_TABLET | Freq: Two times a day (BID) | ORAL | Status: DC
  Administered 2018-09-15 – 2018-09-25 (×20): 250 mg via ORAL
  Filled 2018-09-15 (×20): qty 1

## 2018-09-15 MED ORDER — ACETAMINOPHEN 650 MG RE SUPP: 650.0000 mg | Freq: Four times a day (QID) | RECTAL | Status: DC | PRN

## 2018-09-15 MED ORDER — IOHEXOL 350 MG/ML SOLN
100.0000 mL | Freq: Once | INTRAVENOUS | Status: AC | PRN
  Administered 2018-09-15: 12:00:00 100 mL via INTRAVENOUS

## 2018-09-15 MED ORDER — LISINOPRIL 40 MG PO TABS
40.0000 mg | ORAL_TABLET | Freq: Every day | ORAL | Status: DC
  Administered 2018-09-15 – 2018-09-25 (×11): 40 mg via ORAL
  Filled 2018-09-15 (×11): qty 1

## 2018-09-15 MED ORDER — LORATADINE 10 MG PO TABS
10.0000 mg | ORAL_TABLET | Freq: Every day | ORAL | Status: DC
  Administered 2018-09-16 – 2018-09-25 (×10): 10 mg via ORAL
  Filled 2018-09-15 (×10): qty 1

## 2018-09-15 MED ORDER — AMLODIPINE BESYLATE 10 MG PO TABS
10.0000 mg | ORAL_TABLET | Freq: Every day | ORAL | Status: DC
  Filled 2018-09-15: qty 1

## 2018-09-15 MED ORDER — ENOXAPARIN SODIUM 40 MG/0.4ML ~~LOC~~ SOLN: 40.0000 mg | SUBCUTANEOUS | Status: DC

## 2018-09-15 MED ORDER — SODIUM CHLORIDE 0.9% FLUSH
3.0000 mL | Freq: Two times a day (BID) | INTRAVENOUS | Status: DC
  Administered 2018-09-15 – 2018-09-25 (×18): 3 mL via INTRAVENOUS

## 2018-09-15 MED ORDER — IPRATROPIUM-ALBUTEROL 0.5-2.5 (3) MG/3ML IN SOLN: 3.0000 mL | RESPIRATORY_TRACT | Status: DC | PRN

## 2018-09-15 MED ORDER — FUROSEMIDE 10 MG/ML IJ SOLN
40.0000 mg | Freq: Once | INTRAMUSCULAR | Status: AC
  Administered 2018-09-15: 40 mg via INTRAVENOUS
  Filled 2018-09-15: qty 4

## 2018-09-15 MED ORDER — LEVOTHYROXINE SODIUM 75 MCG PO TABS
75.0000 ug | ORAL_TABLET | ORAL | Status: DC
  Administered 2018-09-16 – 2018-09-25 (×8): 75 ug via ORAL
  Filled 2018-09-15 (×8): qty 1

## 2018-09-15 MED ORDER — SODIUM CHLORIDE 0.9% FLUSH
3.0000 mL | Freq: Two times a day (BID) | INTRAVENOUS | Status: DC
  Administered 2018-09-19 – 2018-09-21 (×2): 3 mL via INTRAVENOUS

## 2018-09-15 NOTE — ED Notes (Signed)
Patient transported to CT 

## 2018-09-15 NOTE — Progress Notes (Signed)
1:1 Nursing note Patient lying in bed asleep, snoring. 1:1 continues with sitter at bedside. Will continue to monitor.

## 2018-09-15 NOTE — ED Notes (Signed)
Dr. Gustavus Messing notified of troponin.

## 2018-09-15 NOTE — ED Notes (Addendum)
Pt was a transfer from [facility] admitted to ED and admitted to floor.
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
## 2018-09-15 NOTE — ED Notes (Signed)
Pt contacting family

## 2018-09-15 NOTE — Progress Notes (Signed)
Patient ID: Madison Palmer, female   DOB: [birth year], 53 y.o.   MRN: [id number]  Pt hypertensive with low O2 stats at 81% and 86 % this morning. Pt observed gasping for air at times but reports she is not having trouble breathing. Pt reports no pain.EMS called and pt transferred to [facility]. Charge RN Roselyn Reef ay [facility] called and report given.

## 2018-09-15 NOTE — ED Notes (Signed)
Sitter at bedside. Pt remains calm and cooperative.

## 2018-09-15 NOTE — ED Notes (Signed)
Received call from pt placement, will not admit for [facility]. Needs to be direct admit.

## 2018-09-15 NOTE — ED Notes (Signed)
ED TO INPATIENT HANDOFF REPORT  ED Nurse Name and Phone #: [phone]  S Name/Age/Gender Madison Palmer 53 y.o. female Room/Bed: 023C/023C  Code Status   Code Status: Full Code  Home/SNF/Other [facility] Patient oriented to: self, place, time and situation Is this baseline? Yes   Triage Complete: Triage complete  Chief Complaint Bipolar Disorder MRE Manic Severe  Triage Note Pt arrives EMS from [facility] with CNA/sitter at side with c/o Shortness of breath. Pt was Hospitalized on on May 14 and has not received her CPAP at night and had low sat this am of 80%. Increased with NRB  orriginally but now 3 L [place] and sat 100%. Denies pain or SHOB this AM   Allergies Allergies  Allergen Reactions  . Fentanyl Hives  . Levofloxacin Hives  . Midazolam Hives  . Pollen Extract     seasonal  . Atorvastatin     Muscle pain in legs   . Doxycycline Itching and Swelling  . Hydralazine Hcl Other (See Comments)    Hypercalcemia   . Rosuvastatin     Abdominal pain    Level of Care/Admitting Diagnosis ED Disposition    ED Disposition Condition [facility] Area: [place] [[id number]]  Level of Care: Progressive [102]  I expect the patient will be discharged within 24 hours: No (not a candidate for 5C-Observation unit)  Covid Evaluation: Screening Protocol (No Symptoms)  Diagnosis: Chronic respiratory failure ([place]) [448.18.ICD-9-CM]  Admitting Physician: Norval Morton [[id number]]  Attending Physician: Norval Morton [[id number]]  PT Class (Do Not Modify): Observation [104]  PT Acc Code (Do Not Modify): Observation [10022]       B Medical/Surgery History Past Medical History:  Diagnosis Date  . Anemia   . Anxiety   . Arrhythmia    tachycardia  . Arthritis   . Chickenpox   . Depression   . Diverticulitis   . GERD (gastroesophageal reflux disease)   . Glaucoma   . Hyperlipidemia   . Hyperparathyroidism ([place])   . Hypertension   . Inflammatory polyps of  colon ([place])   . LVH (left ventricular hypertrophy)   . Lymphedema   . PCOS (polycystic ovarian syndrome)   . Prediabetes   . Recurrent UTI   . Sleep apnea    CPAP  . Thyroid disease   . Vitamin D deficiency    Past Surgical History:  Procedure Laterality Date  . BREAST BIOPSY  2015  . CESAREAN SECTION  2004  . INNER EAR SURGERY     ear and sinus surgery  . LAPAROSCOPIC REPAIR AND REMOVAL OF GASTRIC BAND    . OOPHORECTOMY Left   . TONSILLECTOMY AND ADENOIDECTOMY       A IV Location/Drains/Wounds Patient Lines/Drains/Airways Status   Active Line/Drains/Airways    Name:   Placement date:   Placement time:   Site:   Days:   Peripheral IV 09/15/18 Right Antecubital   09/15/18    0910    Antecubital   less than 1          Intake/Output Last 24 hours No intake or output data in the 24 hours ending 09/15/18 1735  Labs/Imaging Results for orders placed or performed during the hospital encounter of 09/12/18 (from the past 48 hour(s))  Troponin I - ONCE - STAT     Status: Abnormal   Collection Time: 09/15/18  9:18 AM  Result Value Ref Range   Troponin I 0.03 (HH) <0.03 ng/mL  Comment: CRITICAL RESULT CALLED TO, READ BACK BY AND VERIFIED WITH: Thurmond Butts AT 1050 09/15/2018 BY L BENFIELD Performed at [facility], [place] [address], [place], [place] [address]   Brain natriuretic peptide     Status: Abnormal   Collection Time: 09/15/18  9:18 AM  Result Value Ref Range   B Natriuretic Peptide 147.2 (H) 0.0 - 100.0 pg/mL    Comment: Performed at [facility] [address], [place], [place] [address]  D-dimer, quantitative (not at [facility])     Status: Abnormal   Collection Time: 09/15/18  9:18 AM  Result Value Ref Range   D-Dimer, Quant 0.57 (H) 0.00 - 0.50 ug/mL-FEU    Comment: (NOTE) At the manufacturer cut-off of 0.50 ug/mL FEU, this assay has been documented to exclude PE with a sensitivity and negative predictive value of 97 to 99%.  At this time, this assay has  not been approved by the FDA to exclude DVT/VTE. Results should be correlated with clinical presentation. Performed at [facility], [place] [address] [place], [place] [address]   CBC with Differential     Status: None   Collection Time: 09/15/18  9:18 AM  Result Value Ref Range   WBC 5.0 4.0 - 10.5 K/uL   RBC 4.72 3.87 - 5.11 MIL/uL   Hemoglobin 13.7 12.0 - 15.0 g/dL   HCT 43.7 36.0 - 46.0 %   MCV 92.6 80.0 - 100.0 fL   MCH 29.0 26.0 - 34.0 pg   MCHC 31.4 30.0 - 36.0 g/dL   RDW 13.8 11.5 - 15.5 %   Platelets 204 150 - 400 K/uL   nRBC 0.0 0.0 - 0.2 %   Neutrophils Relative % 60 %   Neutro Abs 3.0 1.7 - 7.7 K/uL   Lymphocytes Relative 25 %   Lymphs Abs 1.3 0.7 - 4.0 K/uL   Monocytes Relative 10 %   Monocytes Absolute 0.5 0.1 - 1.0 K/uL   Eosinophils Relative 3 %   Eosinophils Absolute 0.1 0.0 - 0.5 K/uL   Basophils Relative 1 %   Basophils Absolute 0.0 0.0 - 0.1 K/uL   Immature Granulocytes 1 %   Abs Immature Granulocytes 0.04 0.00 - 0.07 K/uL    Comment: Performed at [facility], [address], [place], [place] [address]  Comprehensive metabolic panel     Status: Abnormal   Collection Time: 09/15/18  9:18 AM  Result Value Ref Range   Sodium 144 135 - 145 mmol/L   Potassium 3.8 3.5 - 5.1 mmol/L   Chloride 100 98 - 111 mmol/L   CO2 33 (H) 22 - 32 mmol/L   Glucose, Bld 101 (H) 70 - 99 mg/dL   BUN 8 6 - 20 mg/dL   Creatinine, Ser 1.12 (H) 0.44 - 1.00 mg/dL   Calcium 10.2 8.9 - 10.3 mg/dL   Total Protein 5.9 (L) 6.5 - 8.1 g/dL   Albumin 3.3 (L) 3.5 - 5.0 g/dL   AST 26 15 - 41 U/L   ALT 58 (H) 0 - 44 U/L   Alkaline Phosphatase 73 38 - 126 U/L   Total Bilirubin 1.2 0.3 - 1.2 mg/dL   GFR calc non Af Amer 56 (L) >60 mL/min   GFR calc Af Amer >60 >60 mL/min   Anion gap 11 5 - 15    Comment: Performed at [facility], [place] [address] [place], [place] [address]  Lactic acid, plasma  Status: None   Collection Time: 09/15/18  9:18 AM  Result Value Ref  Range   Lactic Acid, Venous 1.1 0.5 - 1.9 mmol/L    Comment: Performed at [facility] [address], [place], [place] [address]  SARS Coronavirus 2 (CEPHEID - Performed in [place] hospital lab), Hosp Order     Status: None   Collection Time: 09/15/18  9:18 AM  Result Value Ref Range   SARS Coronavirus 2 NEGATIVE NEGATIVE    Comment: (NOTE) If result is NEGATIVE SARS-CoV-2 target nucleic acids are NOT DETECTED. The SARS-CoV-2 RNA is generally detectable in upper and lower  respiratory specimens during the acute phase of infection. The lowest  concentration of SARS-CoV-2 viral copies this assay can detect is 250  copies / mL. A negative result does not preclude SARS-CoV-2 infection  and should not be used as the sole basis for treatment or other  patient management decisions.  A negative result may occur with  improper specimen collection / handling, submission of specimen other  than nasopharyngeal swab, presence of viral mutation(s) within the  areas targeted by this assay, and inadequate number of viral copies  (<250 copies / mL). A negative result must be combined with clinical  observations, patient history, and epidemiological information. If result is POSITIVE SARS-CoV-2 target nucleic acids are DETECTED. The SARS-CoV-2 RNA is generally detectable in upper and lower  respiratory specimens dur ing the acute phase of infection.  Positive  results are indicative of active infection with SARS-CoV-2.  Clinical  correlation with patient history and other diagnostic information is  necessary to determine patient infection status.  Positive results do  not rule out bacterial infection or co-infection with other viruses. If result is PRESUMPTIVE POSTIVE SARS-CoV-2 nucleic acids MAY BE PRESENT.   A presumptive positive result was obtained on the submitted specimen  and confirmed on repeat testing.  While 2019 novel coronavirus  (SARS-CoV-2) nucleic acids may be present in  the submitted sample  additional confirmatory testing may be necessary for epidemiological  and / or clinical management purposes  to differentiate between  SARS-CoV-2 and other Sarbecovirus currently known to infect humans.  If clinically indicated additional testing with an alternate test  methodology [id number]) is advised. The SARS-CoV-2 RNA is generally  detectable in upper and lower respiratory sp ecimens during the acute  phase of infection. The expected result is Negative. Fact Sheet for Patients:  StrictlyIdeas.no Fact Sheet for Healthcare Providers: BankingDealers.co.za This test is not yet approved or cleared by the [place] FDA and has been authorized for detection and/or diagnosis of SARS-CoV-2 by FDA under an Emergency Use Authorization (EUA).  This EUA will remain in effect (meaning this test can be used) for the duration of the COVID-19 declaration under Section 564(b)(1) of the Act, 21 U.S.C. section 360bbb-3(b)(1), unless the authorization is terminated or revoked sooner. Performed at [facility], [place] [address], [place], [place] [address]   Troponin I - ONCE - STAT     Status: Abnormal   Collection Time: 09/15/18 12:48 PM  Result Value Ref Range   Troponin I 0.03 (HH) <0.03 ng/mL    Comment: CRITICAL VALUE NOTED.  VALUE IS CONSISTENT WITH PREVIOUSLY REPORTED AND CALLED VALUE. Performed at [facility], [place] [address] [place], [place] [address]   I-STAT 7, (LYTES, BLD GAS, ICA, H+H)     Status: Abnormal   Collection Time: 09/15/18  3:51 PM  Result Value Ref Range  pH, Arterial 7.359 7.350 - 7.450   pCO2 arterial 64.9 (H) 32.0 - 48.0 mmHg   pO2, Arterial 195.0 (H) 83.0 - 108.0 mmHg   Bicarbonate 36.6 (H) 20.0 - 28.0 mmol/L   TCO2 39 (H) 22 - 32 mmol/L   O2 Saturation 100.0 %   Acid-Base Excess 9.0 (H) 0.0 - 2.0 mmol/L   Sodium 140 135 - 145 mmol/L   Potassium 3.7 3.5 - 5.1 mmol/L   Calcium, Ion  1.39 1.15 - 1.40 mmol/L   HCT 38.0 36.0 - 46.0 %   Hemoglobin 12.9 12.0 - 15.0 g/dL   Patient temperature HIDE    Collection site RADIAL, ALLEN'S TEST ACCEPTABLE    Sample type ARTERIAL    Ct Angio Chest Pe W And/or Wo Contrast  Result Date: 09/15/2018 CLINICAL DATA:  PE suspected, intermediate probability, positive D-dimer. EXAM: CT ANGIOGRAPHY CHEST WITH CONTRAST TECHNIQUE: Multidetector CT imaging of the chest was performed using the standard protocol during bolus administration of intravenous contrast. Multiplanar CT image reconstructions and MIPs were obtained to evaluate the vascular anatomy. CONTRAST:  1104mL OMNIPAQUE IOHEXOL 350 MG/ML SOLN COMPARISON:  One-view chest x-ray 09/15/2018 FINDINGS: Cardiovascular: The heart is enlarged. Atherosclerotic calcifications are present in the aortic arch. Arch and great vessel origins are otherwise normal. There is no aneurysm or focal stenosis. Pulmonary artery opacification is satisfactory. No focal filling defects are present to suggest pulmonary embolus. Pulmonary artery size is within normal limits. Coronary artery calcifications are present. Mediastinum/Nodes: No significant mediastinal, hilar, or axillary adenopathy is present. 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 Esophagus is unremarkable. Lungs/Pleura: Innumerable bilateral pulmonary nodules are again seen. Nodules are similar in size to the prior exam. In the absence treatment, this makes neoplasm less likely. This may be related to chronic inflammation. No significant pleural effusion is present. Upper Abdomen: There is diffuse fatty infiltration liver. No discrete lesions are present. Limited imaging of the abdomen is otherwise unremarkable. Musculoskeletal: Degenerative changes of thoracic spine are stable. No focal lytic or blastic lesions are present. Ribs are unremarkable. Review of the MIP images confirms the above findings. IMPRESSION: 1. No pulmonary embolus. 2. No acute abnormality. 3. Stable  appearance of multiple bilateral pulmonary nodules. The absence of interval treatment, neoplasm is less likely. Electronically Signed   By: San Morelle M.D.   On: 09/15/2018 11:56   Dg Chest Portable 1 View  Result Date: 09/15/2018 CLINICAL DATA:  Hypoxia. EXAM: PORTABLE CHEST 1 VIEW COMPARISON:  One-view chest x-ray 08/20/2018 FINDINGS: The heart is enlarged. This is exaggerated by low lung volumes. Mild interstitial edema is present. No definite effusions are present. The visualized soft tissues and bony thorax are unremarkable. IMPRESSION: 1. Cardiomegaly and edema suggesting congestive heart failure. Electronically Signed   By: San Morelle M.D.   On: 09/15/2018 09:19    Pending Labs Unresulted Labs (From admission, onward)    Start     Ordered   09/15/18 1441  Blood gas, arterial  Once,   R     09/15/18 1440   09/15/18 0846  Lactic acid, plasma  Now then every 2 hours,   STAT     09/15/18 0845          Vitals/Pain Today's Vitals   09/15/18 1704 09/15/18 1705 09/15/18 1715 09/15/18 1730  BP:   (!) 218/111 (!) 227/117  Pulse: (!) 103 96 81 86  Resp:   (!) 21 (!) 26  Temp:      TempSrc:  SpO2: 98% 96% 100% 100%  Weight:      Height:      PainSc:        Isolation Precautions No active isolations  Medications Medications  acetaminophen (TYLENOL) tablet 650 mg (has no administration in time range)  alum & mag hydroxide-simeth (MAALOX/MYLANTA) 200-200-20 MG/5ML suspension 30 mL (has no administration in time range)  magnesium hydroxide (MILK OF MAGNESIA) suspension 30 mL (has no administration in time range)  fluticasone (FLONASE) 50 MCG/ACT nasal spray 2 spray (2 sprays Each Nare Given 09/14/18 0845)  levothyroxine (SYNTHROID) tablet 75 mcg (75 mcg Oral Given 09/15/18 0647)  loratadine (CLARITIN) tablet 10 mg (10 mg Oral Given 09/14/18 0845)  pantoprazole (PROTONIX) EC tablet 40 mg (40 mg Oral Given 09/14/18 0847)  potassium chloride SA (K-DUR) CR tablet  40 mEq (40 mEq Oral Given 09/14/18 0846)  predniSONE (DELTASONE) tablet 5 mg (5 mg Oral Given 09/14/18 0845)  LORazepam (ATIVAN) tablet 1 mg (1 mg Oral Not Given 09/15/18 0400)    Or  LORazepam (ATIVAN) injection 1 mg ( Intramuscular See Alternative 09/15/18 0400)  labetalol (NORMODYNE) tablet 600 mg (600 mg Oral Not Given 09/12/18 2300)  nystatin (MYCOSTATIN/NYSTOP) topical powder ( Topical Given 09/14/18 1656)  divalproex (DEPAKOTE) DR tablet 250 mg (250 mg Oral Given 09/14/18 2031)  perphenazine (TRILAFON) tablet 4 mg (4 mg Oral Given 09/14/18 1656)  cloNIDine (CATAPRES) tablet 0.2 mg (0.2 mg Oral Given 09/15/18 1724)  LORazepam (ATIVAN) tablet 2 mg (2 mg Oral See Procedure Record 09/12/18 1648)  amLODipine (NORVASC) tablet 10 mg (10 mg Oral Given 09/12/18 1648)  cloNIDine (CATAPRES) tablet 0.2 mg (0.2 mg Oral Given 09/12/18 1648)  lisinopril (ZESTRIL) tablet 40 mg (40 mg Oral Given 09/12/18 1647)  OLANZapine zydis (ZYPREXA) disintegrating tablet 5 mg (5 mg Oral Given 09/12/18 1647)  iohexol (OMNIPAQUE) 350 MG/ML injection 100 mL (100 mLs Intravenous Contrast Given 09/15/18 1142)  furosemide (LASIX) injection 40 mg (40 mg Intravenous Given 09/15/18 1713)    Mobility walks Moderate fall risk   Focused Assessments   R Recommendations: See Admitting Provider Note  Report given to:   Additional Notes:

## 2018-09-15 NOTE — Progress Notes (Signed)
[facility] MD Progress Note  09/15/2018 12:27 PM Madison Palmer  MRN:  [id number]   Subjective: Patient reports feeling "the same".  Denies suicidal ideations.  States mood has improved.  Currently does not endorse chest pain or subjective sense of dyspnea.   Objective: I have discussed case with RN, reviewed chart.  Patient is a 53 year old female, admitted at [facility] on 5/15.  Had presented to ER on 513 for behavioral disturbance, "screaming fits" delusions, disorganized speech.  She is diagnosed with schizoaffective disorder.  Mood disorder secondary to medical comorbidity also considered.  RN reports patient's O2 sats have decreased this morning to 81% and 86%.  Although patient denies dyspnea she was noted to be having difficulty breathing.  Her vitals are 168/96 with a pulse of 87.  Patient is alert, but somewhat distractible, and is partially disoriented ( oriented to 2020, but not to month or day of week, oriented to hospital but not city) .  Patient has a history of chronic medical illness including hypertension, LVH. She also reports she has a history of sleep apnea, but does not currently have her CPAP machine.  Denies suicidal ideations.  Does not endorse medication side effects.    Principal Problem: Bipolar 1 disorder ([place]) Diagnosis: Principal Problem:   Bipolar 1 disorder ([place])  Total Time spent with patient: 15 minutes  Past Psychiatric History: See H&P  Past Medical History:  Past Medical History:  Diagnosis Date  . Anemia   . Anxiety   . Arrhythmia    tachycardia  . Arthritis   . Chickenpox   . Depression   . Diverticulitis   . GERD (gastroesophageal reflux disease)   . Glaucoma   . Hyperlipidemia   . Hyperparathyroidism ([place])   . Hypertension   . Inflammatory polyps of colon ([place])   . LVH (left ventricular hypertrophy)   . Lymphedema   . PCOS (polycystic ovarian syndrome)   . Prediabetes   . Recurrent UTI   . Sleep apnea    CPAP  . Thyroid disease   .  Vitamin D deficiency     Past Surgical History:  Procedure Laterality Date  . BREAST BIOPSY  2015  . CESAREAN SECTION  2004  . INNER EAR SURGERY     ear and sinus surgery  . LAPAROSCOPIC REPAIR AND REMOVAL OF GASTRIC BAND    . OOPHORECTOMY Left   . TONSILLECTOMY AND ADENOIDECTOMY     Family History:  Family History  Adopted: Yes  Problem Relation Age of Onset  . Hearing loss Son        right   . Healthy Son    Family Psychiatric  History: See H&P Social History:  Social History   Substance and Sexual Activity  Alcohol Use Yes   Comment: social     Social History   Substance and Sexual Activity  Drug Use No    Social History   Socioeconomic History  . Marital status: Married    Spouse name: Not on file  . Number of children: 1  . Years of education: Not on file  . Highest education level: Not on file  Occupational History  . Occupation: Unemployed  Social Needs  . Financial resource strain: Not on file  . Food insecurity:    Worry: Not on file    Inability: Not on file  . Transportation needs:    Medical: Not on file    Non-medical: Not on file  Tobacco Use  . Smoking status:  Never Smoker  . Smokeless tobacco: Never Used  Substance and Sexual Activity  . Alcohol use: Yes    Comment: social  . Drug use: No  . Sexual activity: Not Currently  Lifestyle  . Physical activity:    Days per week: Not on file    Minutes per session: Not on file  . Stress: Not on file  Relationships  . Social connections:    Talks on phone: Not on file    Gets together: Not on file    Attends religious service: Not on file    Active member of club or organization: Not on file    Attends meetings of clubs or organizations: Not on file    Relationship status: Not on file  Other Topics Concern  . Not on file  Social History Narrative   Lives with son and companion.     Additional Social History:   Sleep: Fair  Appetite:  Fair  Current Medications: Current  Facility-Administered Medications  Medication Dose Route Frequency Provider Last Rate Last Dose  . acetaminophen (TYLENOL) tablet 650 mg  650 mg Oral Q6H PRN Ethelene Hal, NP      . alum & mag hydroxide-simeth (MAALOX/MYLANTA) 200-200-20 MG/5ML suspension 30 mL  30 mL Oral Q4H PRN Ethelene Hal, NP      . cloNIDine (CATAPRES) tablet 0.2 mg  0.2 mg Oral BID Johnn Hai, MD   0.2 mg at 09/14/18 1656  . divalproex (DEPAKOTE) DR tablet 250 mg  250 mg Oral BID Johnn Hai, MD   250 mg at 09/14/18 2031  . fluticasone (FLONASE) 50 MCG/ACT nasal spray 2 spray  2 spray Each Nare Daily Ethelene Hal, NP   2 spray at 09/14/18 0845  . levothyroxine (SYNTHROID) tablet 75 mcg  75 mcg Oral Q0600 Ethelene Hal, NP   75 mcg at 09/15/18 8315  . loratadine (CLARITIN) tablet 10 mg  10 mg Oral Daily Ethelene Hal, NP   10 mg at 09/14/18 0845  . LORazepam (ATIVAN) tablet 1 mg  1 mg Oral Q4H Johnn Hai, MD   1 mg at 09/15/18 0111   Or  . LORazepam (ATIVAN) injection 1 mg  1 mg Intramuscular Q4H Johnn Hai, MD      . magnesium hydroxide (MILK OF MAGNESIA) suspension 30 mL  30 mL Oral Daily PRN Ethelene Hal, NP      . nystatin (MYCOSTATIN/NYSTOP) topical powder   Topical BID Ethelene Hal, NP      . pantoprazole (PROTONIX) EC tablet 40 mg  40 mg Oral Daily Ethelene Hal, NP   40 mg at 09/14/18 0847  . perphenazine (TRILAFON) tablet 4 mg  4 mg Oral TID Johnn Hai, MD   4 mg at 09/14/18 1656  . potassium chloride SA (K-DUR) CR tablet 40 mEq  40 mEq Oral Daily Ethelene Hal, NP   40 mEq at 09/14/18 0846  . predniSONE (DELTASONE) tablet 5 mg  5 mg Oral Daily Ethelene Hal, NP   5 mg at 09/14/18 1761   Current Outpatient Medications  Medication Sig Dispense Refill  . acetaminophen (TYLENOL) 500 MG tablet Take 1,000 mg by mouth 3 (three) times daily as needed for moderate pain or headache.    Marland Kitchen amLODipine (NORVASC) 10 MG tablet Take 10  mg by mouth daily.    . benzonatate (TESSALON) 100 MG capsule Take 1 capsule (100 mg total) by mouth 3 (three) times daily as needed for cough. (  Patient not taking: Reported on 08/19/2018) 20 capsule 0  . cetirizine (ZYRTEC ALLERGY) 10 MG tablet Take 1 tablet (10 mg total) by mouth daily. 30 tablet 5  . cloNIDine (CATAPRES) 0.2 MG tablet Take 0.2 mg by mouth 2 (two) times a day.    . divalproex (DEPAKOTE) 500 MG DR tablet Take 500 mg by mouth 2 (two) times a day.    . esomeprazole (NEXIUM) 40 MG capsule Take 1 capsule (40 mg total) by mouth 2 (two) times daily. 180 capsule 2  . fluticasone (FLONASE) 50 MCG/ACT nasal spray Place 2 sprays into both nostrils daily.    . furosemide (LASIX) 40 MG tablet Take 1 tablet (40 mg total) by mouth 2 (two) times daily. 180 tablet 1  . hydrALAZINE (APRESOLINE) 100 MG tablet Take 100 mg by mouth 3 (three) times daily.    . hydrochlorothiazide (HYDRODIURIL) 50 MG tablet Take 50 mg by mouth daily.    Marland Kitchen ibuprofen (ADVIL) 200 MG tablet Take 400 mg by mouth every 6 (six) hours as needed for moderate pain.    Marland Kitchen labetalol (NORMODYNE) 300 MG tablet Take 600 mg by mouth 2 (two) times a day.    . levothyroxine (SYNTHROID) 75 MCG tablet Take 75 mcg by mouth as directed. 5 days weekly    . lisinopril (ZESTRIL) 40 MG tablet Take 40 mg by mouth daily.    . Lurasidone HCl 60 MG TABS Take 60 mg by mouth daily after supper.    . metFORMIN (GLUCOPHAGE) 500 MG tablet Take 500 mg by mouth 2 (two) times daily with a meal.    . potassium chloride SA (K-DUR,KLOR-CON) 20 MEQ tablet Take 2 tablets (40 mEq total) by mouth daily. 180 tablet 2  . predniSONE (DELTASONE) 5 MG tablet Take 1 tablet (5 mg total) by mouth daily. (Patient not taking: Reported on 09/12/2018) 60 tablet 3  . Vitamin D, Ergocalciferol, (DRISDOL) 1.25 MG (50000 UT) CAPS capsule TAKE 1 CAPSULE BY MOUTH EVERY 7 DAYS (Patient not taking: Reported on 09/12/2018) 12 capsule 0    Lab Results:  Results for orders placed or  performed during the hospital encounter of 09/12/18 (from the past 48 hour(s))  Troponin I - ONCE - STAT     Status: Abnormal   Collection Time: 09/15/18  9:18 AM  Result Value Ref Range   Troponin I 0.03 (HH) <0.03 ng/mL    Comment: CRITICAL RESULT CALLED TO, READ BACK BY AND VERIFIED WITH: Thurmond Butts AT 1050 09/15/2018 BY L BENFIELD Performed at [facility], [place] [address], [place], [place] [address]   Brain natriuretic peptide     Status: Abnormal   Collection Time: 09/15/18  9:18 AM  Result Value Ref Range   B Natriuretic Peptide 147.2 (H) 0.0 - 100.0 pg/mL    Comment: Performed at [facility] [address], [place], [place] [address]  D-dimer, quantitative (not at [facility])     Status: Abnormal   Collection Time: 09/15/18  9:18 AM  Result Value Ref Range   D-Dimer, Quant 0.57 (H) 0.00 - 0.50 ug/mL-FEU    Comment: (NOTE) At the manufacturer cut-off of 0.50 ug/mL FEU, this assay has been documented to exclude PE with a sensitivity and negative predictive value of 97 to 99%.  At this time, this assay has not been approved by the FDA to exclude DVT/VTE. Results should be correlated with clinical presentation. Performed at [facility], [place] [address] [place], [place] [address]  CBC with Differential     Status: None   Collection Time: 09/15/18  9:18 AM  Result Value Ref Range   WBC 5.0 4.0 - 10.5 K/uL   RBC 4.72 3.87 - 5.11 MIL/uL   Hemoglobin 13.7 12.0 - 15.0 g/dL   HCT 43.7 36.0 - 46.0 %   MCV 92.6 80.0 - 100.0 fL   MCH 29.0 26.0 - 34.0 pg   MCHC 31.4 30.0 - 36.0 g/dL   RDW 13.8 11.5 - 15.5 %   Platelets 204 150 - 400 K/uL   nRBC 0.0 0.0 - 0.2 %   Neutrophils Relative % 60 %   Neutro Abs 3.0 1.7 - 7.7 K/uL   Lymphocytes Relative 25 %   Lymphs Abs 1.3 0.7 - 4.0 K/uL   Monocytes Relative 10 %   Monocytes Absolute 0.5 0.1 - 1.0 K/uL   Eosinophils Relative 3 %   Eosinophils Absolute 0.1 0.0 - 0.5 K/uL   Basophils Relative 1 %   Basophils Absolute 0.0  0.0 - 0.1 K/uL   Immature Granulocytes 1 %   Abs Immature Granulocytes 0.04 0.00 - 0.07 K/uL    Comment: Performed at [facility], [address], [place], [place] [address]  Comprehensive metabolic panel     Status: Abnormal   Collection Time: 09/15/18  9:18 AM  Result Value Ref Range   Sodium 144 135 - 145 mmol/L   Potassium 3.8 3.5 - 5.1 mmol/L   Chloride 100 98 - 111 mmol/L   CO2 33 (H) 22 - 32 mmol/L   Glucose, Bld 101 (H) 70 - 99 mg/dL   BUN 8 6 - 20 mg/dL   Creatinine, Ser 1.12 (H) 0.44 - 1.00 mg/dL   Calcium 10.2 8.9 - 10.3 mg/dL   Total Protein 5.9 (L) 6.5 - 8.1 g/dL   Albumin 3.3 (L) 3.5 - 5.0 g/dL   AST 26 15 - 41 U/L   ALT 58 (H) 0 - 44 U/L   Alkaline Phosphatase 73 38 - 126 U/L   Total Bilirubin 1.2 0.3 - 1.2 mg/dL   GFR calc non Af Amer 56 (L) >60 mL/min   GFR calc Af Amer >60 >60 mL/min   Anion gap 11 5 - 15    Comment: Performed at [facility], [place] [address] [place], [place] [address]  Lactic acid, plasma     Status: None   Collection Time: 09/15/18  9:18 AM  Result Value Ref Range   Lactic Acid, Venous 1.1 0.5 - 1.9 mmol/L    Comment: Performed at [facility] [address] [place], [place] [address]  SARS Coronavirus 2 (CEPHEID - Performed in [place] hospital lab), Hosp Order     Status: None   Collection Time: 09/15/18  9:18 AM  Result Value Ref Range   SARS Coronavirus 2 NEGATIVE NEGATIVE    Comment: (NOTE) If result is NEGATIVE SARS-CoV-2 target nucleic acids are NOT DETECTED. The SARS-CoV-2 RNA is generally detectable in upper and lower  respiratory specimens during the acute phase of infection. The lowest  concentration of SARS-CoV-2 viral copies this assay can detect is 250  copies / mL. A negative result does not preclude SARS-CoV-2 infection  and should not be used as the sole basis for treatment or other  patient management decisions.  A negative result may occur with  improper specimen collection / handling,  submission of specimen other  than nasopharyngeal swab, presence of viral mutation(s) within the  areas targeted by this assay, and inadequate number of viral copies  (<250 copies / mL). A negative result must be combined with clinical  observations, patient history, and epidemiological information. If result is POSITIVE SARS-CoV-2 target nucleic acids are DETECTED. The SARS-CoV-2 RNA is generally detectable in upper and lower  respiratory specimens dur ing the acute phase of infection.  Positive  results are indicative of active infection with SARS-CoV-2.  Clinical  correlation with patient history and other diagnostic information is  necessary to determine patient infection status.  Positive results do  not rule out bacterial infection or co-infection with other viruses. If result is PRESUMPTIVE POSTIVE SARS-CoV-2 nucleic acids MAY BE PRESENT.   A presumptive positive result was obtained on the submitted specimen  and confirmed on repeat testing.  While 2019 novel coronavirus  (SARS-CoV-2) nucleic acids may be present in the submitted sample  additional confirmatory testing may be necessary for epidemiological  and / or clinical management purposes  to differentiate between  SARS-CoV-2 and other Sarbecovirus currently known to infect humans.  If clinically indicated additional testing with an alternate test  methodology [phone]) is advised. The SARS-CoV-2 RNA is generally  detectable in upper and lower respiratory sp ecimens during the acute  phase of infection. The expected result is Negative. Fact Sheet for Patients:  StrictlyIdeas.no Fact Sheet for Healthcare Providers: BankingDealers.co.za This test is not yet approved or cleared by the [place] FDA and has been authorized for detection and/or diagnosis of SARS-CoV-2 by FDA under an Emergency Use Authorization (EUA).  This EUA will remain in effect (meaning this test can be  used) for the duration of the COVID-19 declaration under Section 564(b)(1) of the Act, 21 U.S.C. section 360bbb-3(b)(1), unless the authorization is terminated or revoked sooner. Performed at [facility], [place] [address] [place], [place] [address]     Blood Alcohol level:  Lab Results  Component Value Date   [facility] <10 09/11/2018   ETH <10 84/66/5993    Metabolic Disorder Labs: Lab Results  Component Value Date   HGBA1C 6.0 (H) 02/27/2018   No results found for: PROLACTIN Lab Results  Component Value Date   CHOL 196 02/27/2018   TRIG 142 02/27/2018   HDL 46 02/27/2018   CHOLHDL 5 09/10/2017   VLDL 26.6 09/10/2017   LDLCALC 122 (H) 02/27/2018   LDLCALC 142 (H) 09/10/2017    Physical Findings: AIMS: Facial and Oral Movements Muscles of Facial Expression: None, normal Lips and Perioral Area: None, normal Jaw: None, normal Tongue: None, normal,Extremity Movements Upper (arms, wrists, hands, fingers): None, normal Lower (legs, knees, ankles, toes): None, normal, Trunk Movements Neck, shoulders, hips: None, normal, Overall Severity Severity of abnormal movements (highest score from questions above): None, normal Incapacitation due to abnormal movements: None, normal Patient's awareness of abnormal movements (rate only patient's report): No Awareness, Dental Status Current problems with teeth and/or dentures?: No Does patient usually wear dentures?: No  CIWA:    COWS:     Musculoskeletal: Strength & Muscle Tone: within normal limits Gait & Station: normal Patient leans: N/A  Psychiatric Specialty Exam: Physical Exam  Nursing note and vitals reviewed. Constitutional: She is oriented to person, place, and time. She appears well-developed and well-nourished.  Respiratory: Effort normal.  Musculoskeletal: Normal range of motion.  Neurological: She is alert and oriented to person, place, and time.  Skin: Skin is warm.    Review of Systems  Constitutional:  Negative.   HENT: Negative.   Eyes:  Negative.   Respiratory: Negative.   Cardiovascular: Negative.   Gastrointestinal: Negative.   Genitourinary: Negative.   Musculoskeletal: Negative.   Skin: Negative.   Neurological: Negative.   Endo/Heme/Allergies: Negative.   Psychiatric/Behavioral: Positive for depression and hallucinations.  Denies chest pain or feeling short of breath at this time.   Blood pressure (!) 168/96, pulse 87, temperature 98.3 F (36.8 C), temperature source Oral, resp. rate (!) 27, height 5\' 2"  (1.575 m), weight (!) 150 kg, last menstrual period 05/02/2017, SpO2 99 %.Body mass index is 60.48 kg/m.  General Appearance: Fairly Groomed  Eye Contact:  Fair  Speech:  Slow  Volume:  Decreased  Mood:  Depressed  Affect:  blunted   Thought Process:  Linear and Descriptions of Associations: Circumstantial  Orientation:  Other:  alert, somewhat distractible, partially oriented - see above   Thought Content:  Not internally preoccupied at this time, denies hallucinations  Suicidal Thoughts:  No denies suicidal ideations  Homicidal Thoughts:  No  Memory:  Recent and remote fair  Judgement:  Fair  Insight:  Fair  Psychomotor Activity:  Decreased no current psychomotor agitation or overt restlessness  Concentration:  Concentration: Poor and Attention Span: Poor  Recall:  [employer] of Knowledge:  Fair  Language:  Good  Akathisia:  No  Handed:  Right  AIMS (if indicated):     Assets:  Desire for Improvement Financial Resources/Insurance Housing Physical Health Social Support Transportation  ADL's:  Intact  Cognition:  WNL  Sleep:  Number of Hours: 6   Assessment -  Staff reports patient's O2 saturation has dropped this morning, down to 81% earlier.  RN also reports that patient seems more confused today.  Patient currently denies chest pain and does not appear to be in any acute distress.  She also denies dyspnea.  She has a history of chronic medical illnesses to  include hypertension, LVH, sleep apnea.  Based on above, patient being transferred to ED for medical management and work-up.  Treatment Plan Summary: Daily contact with patient to assess and evaluate symptoms and progress in treatment, Medication management and Plan is to: Continue clonidine 0.2 mg p.o. twice daily Continue Depakote 250 mg p.o. twice daily for mood stability Continue perphenazine 4 mg p.o. 3 times daily for psychosis Continue Ativan 1 mg p.o. every 4 hours Continue every 15 minute safety checks Encourage group therapy participation Continue one-to-one sitter for safety Patient referred to ED for work-up due to hypoxemia/confusion.   Jenne Campus, MD 09/15/2018, 12:27 PM   Patient ID: Madison Palmer, female   DOB: [birth year], 53 y.o.   MRN: [id number]

## 2018-09-15 NOTE — Progress Notes (Signed)
1:1 Nursing note (late entry d/t writer completing an admission) Pt currently lying in bed asleep, snoring. 1:1 continues with sitter at bedside. Pt is safe.

## 2018-09-15 NOTE — ED Notes (Signed)
Dr. Gustavus Messing states to cancel second lactic.

## 2018-09-15 NOTE — ED Notes (Addendum)
Pt noted as sat of 82% on room air while sitting in bed. Dr. Gustavus Messing informedSitter remains at bedsid ean pt remains calm an cooperative.

## 2018-09-15 NOTE — Progress Notes (Signed)
Patient ID: Madison Palmer, female   DOB: 27-Apr-1966, 53 y.o.   MRN: [id number]   [place] NOVEL CORONAVIRUS (COVID-19) DAILY CHECK-OFF SYMPTOMS - answer yes or no to each - every day NO YES  Have you had a fever in the past 24 hours?  . Fever (Temp > 37.80C / 100F) X   Have you had any of these symptoms in the past 24 hours? . New Cough .  Sore Throat  .  Shortness of Breath .  Difficulty Breathing .  Unexplained Body Aches   X   Have you had any one of these symptoms in the past 24 hours not related to allergies?   . Runny Nose .  Nasal Congestion .  Sneezing   X   If you have had runny nose, nasal congestion, sneezing in the past 24 hours, has it worsened?  X   EXPOSURES - check yes or no X   Have you traveled outside the state in the past 14 days?  X   Have you been in contact with someone with a confirmed diagnosis of COVID-19 or PUI in the past 14 days without wearing appropriate PPE?  X   Have you been living in the same home as a person with confirmed diagnosis of COVID-19 or a PUI (household contact)?    X   Have you been diagnosed with COVID-19?    X              What to do next: Answered NO to all: Answered YES to anything:   Proceed with unit schedule Follow the [facility] Inpatient Flowsheet.

## 2018-09-15 NOTE — ED Provider Notes (Signed)
[facility] [place] EMERGENCY DEPARTMENT Provider Note   CSN: [id number] Arrival date & time: 09/15/18  1829    History   Chief Complaint Chief Complaint  Patient presents with  . Shortness of Breath    HPI Madison Palmer is a 53 y.o. female.     The history is provided by the patient and medical records.  Shortness of Breath  Severity:  Unable to specify (none but is hypoxia) Onset quality:  Unable to specify Timing:  Constant Progression:  Resolved Chronicity:  Chronic Context: not URI   Relieved by:  Nothing Worsened by:  Nothing Associated symptoms: no abdominal pain, no chest pain, no claudication, no cough, no diaphoresis, no fever, no headaches, no hemoptysis, no neck pain, no rash, no sputum production, no syncope, no vomiting and no wheezing   Risk factors: obesity     Past Medical History:  Diagnosis Date  . Anemia   . Anxiety   . Arrhythmia    tachycardia  . Arthritis   . Chickenpox   . Depression   . Diverticulitis   . GERD (gastroesophageal reflux disease)   . Glaucoma   . Hyperlipidemia   . Hyperparathyroidism ([place])   . Hypertension   . Inflammatory polyps of colon ([place])   . LVH (left ventricular hypertrophy)   . Lymphedema   . PCOS (polycystic ovarian syndrome)   . Prediabetes   . Recurrent UTI   . Sleep apnea    CPAP  . Thyroid disease   . Vitamin D deficiency     Patient Active Problem List   Diagnosis Date Noted  . Bipolar 1 disorder ([place]) 09/12/2018  . Prediabetes 09/05/2018  . Class 3 severe obesity with serious comorbidity and body mass index (BMI) of 60.0 to 69.9 in adult ([place]) 09/05/2018  . Bipolar I disorder, current or most recent episode manic, with psychotic features ([place])   . Adenomatous polyp 08/18/2018  . Educated About Covid-19 Virus Infection 08/15/2018  . Pulmonary vascular congestion 06/24/2018  . Chronic diastolic heart failure ([place]) 05/15/2018  . Dyspnea 05/15/2018  . Pulmonary nodules 05/11/2018   . Hypertensive urgency 05/11/2018  . Chronic respiratory failure with hypoxia ([place]) 05/10/2018  . 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 DDD (degenerative disc disease), lumbar 03/21/2018  . Primary osteoarthritis of both knees 03/21/2018  . Dysphagia 03/19/2018  . PVC (premature ventricular contraction) 03/12/2018  . LVH (left ventricular hypertrophy) 03/12/2018  . Generalized edema 03/12/2018  . Bilateral carotid artery stenosis 03/12/2018  . Weight gain 01/25/2018  . Hemorrhoids 01/05/2018  . Generalized postprandial abdominal pain 01/05/2018  . Hiatal hernia 01/05/2018  . Esophageal dysmotilities 01/05/2018  . Mixed hyperlipidemia 09/12/2017  . Multinodular goiter 07/27/2017  . Hyperparathyroidism ([place]) 06/13/2017  . Vitamin D deficiency 06/13/2017  . History of colonic polyps 05/15/2017  . Eustachian tube dysfunction, right 05/15/2017  . Lymphatic edema 05/15/2017  . Hypertension 05/14/2017  . Anemia 05/11/2017  . Arrhythmia 05/11/2017  . Diverticulosis 05/11/2017  . GERD (gastroesophageal reflux disease) 05/11/2017  . Hypothyroidism 05/11/2017  . Lower extremity edema 05/11/2017  . Morbid obesity ([place]) 05/11/2017  . Polycystic ovary syndrome 05/11/2017  . OSA on CPAP 05/11/2017  . Neck pain 05/11/2017  . Allergic rhinitis 05/11/2017  . 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 BREAST BIOPSY  2015  . CESAREAN SECTION  2004  . INNER EAR SURGERY     ear  and sinus surgery  . LAPAROSCOPIC REPAIR AND REMOVAL OF GASTRIC BAND    . OOPHORECTOMY Left   . TONSILLECTOMY AND ADENOIDECTOMY       OB History    Gravida  1   Para  1   Term      Preterm      AB      Living  1     SAB      TAB      Ectopic      Multiple      Live Births               Home Medications    Prior to Admission medications   Medication Sig Start Date End Date Taking?  Authorizing Provider  acetaminophen (TYLENOL) 500 MG tablet Take 1,000 mg by mouth 3 (three) times daily as needed for moderate pain or headache.    [provider]  amLODipine (NORVASC) 10 MG tablet Take 10 mg by mouth daily. 09/05/18 10/05/18  [provider]  benzonatate (TESSALON) 100 MG capsule Take 1 capsule (100 mg total) by mouth 3 (three) times daily as needed for cough. Patient not taking: Reported on 08/19/2018 05/21/18   [name], Charlene Brooke, NP  cetirizine (ZYRTEC ALLERGY) 10 MG tablet Take 1 tablet (10 mg total) by mouth daily. 03/13/18   [name], Charlene Brooke, NP  cloNIDine (CATAPRES) 0.2 MG tablet Take 0.2 mg by mouth 2 (two) times a day. 09/04/18 10/04/18  [provider]  divalproex (DEPAKOTE) 500 MG DR tablet Take 500 mg by mouth 2 (two) times a day. 09/04/18 10/04/18  [provider]  esomeprazole (NEXIUM) 40 MG capsule Take 1 capsule (40 mg total) by mouth 2 (two) times daily. 08/15/18   Mansouraty, Telford Nab., MD  fluticasone (FLONASE) 50 MCG/ACT nasal spray Place 2 sprays into both nostrils daily.    [provider]  furosemide (LASIX) 40 MG tablet Take 1 tablet (40 mg total) by mouth 2 (two) times daily. 06/24/18   Minus Breeding, MD  hydrALAZINE (APRESOLINE) 100 MG tablet Take 100 mg by mouth 3 (three) times daily. 09/04/18 10/04/18  [provider]  hydrochlorothiazide (HYDRODIURIL) 50 MG tablet Take 50 mg by mouth daily.    [provider]  ibuprofen (ADVIL) 200 MG tablet Take 400 mg by mouth every 6 (six) hours as needed for moderate pain.    [provider]  labetalol (NORMODYNE) 300 MG tablet Take 600 mg by mouth 2 (two) times a day. 09/04/18 10/04/18  [provider]  levothyroxine (SYNTHROID) 75 MCG tablet Take 75 mcg by mouth as directed. 5 days weekly    [provider]  lisinopril (ZESTRIL) 40 MG tablet Take 40 mg by mouth daily.    [provider]  Lurasidone HCl 60 MG TABS Take 60 mg by  mouth daily after supper. 09/04/18   [provider]  metFORMIN (GLUCOPHAGE) 500 MG tablet Take 500 mg by mouth 2 (two) times daily with a meal.    [provider]  potassium chloride SA (K-DUR,KLOR-CON) 20 MEQ tablet Take 2 tablets (40 mEq total) by mouth daily. 06/24/18   Minus Breeding, MD  predniSONE (DELTASONE) 5 MG tablet Take 1 tablet (5 mg total) by mouth daily. Patient not taking: Reported on 09/12/2018 08/16/18   Rigoberto Noel, MD  Vitamin D, Ergocalciferol, (DRISDOL) 1.25 MG (50000 UT) CAPS capsule TAKE 1 CAPSULE BY MOUTH EVERY 7 DAYS Patient not taking: Reported on 09/12/2018 09/05/18  [place], Joneen Boers, FNP    Family History Family History  Adopted: Yes  Problem Relation Age of Onset  . Hearing loss Son        right   . Healthy Son     Social History Social History   Tobacco Use  . Smoking status: Never Smoker  . Smokeless tobacco: Never Used  Substance Use Topics  . Alcohol use: Yes    Comment: social  . Drug use: No     Allergies   Fentanyl; Levofloxacin; Midazolam; Pollen extract; Atorvastatin; Doxycycline; Hydralazine hcl; and Rosuvastatin   Review of Systems Review of Systems  Constitutional: Negative for chills, diaphoresis, fatigue and fever.  HENT: Negative for congestion.   Eyes: Negative for visual disturbance.  Respiratory: Positive for shortness of breath. Negative for cough, hemoptysis, sputum production, choking, chest tightness, wheezing and stridor.   Cardiovascular: Positive for leg swelling. Negative for chest pain, palpitations, claudication and syncope.  Gastrointestinal: Negative for abdominal pain, constipation, diarrhea, nausea and vomiting.  Genitourinary: Negative for dysuria and flank pain.  Musculoskeletal: Negative for back pain, neck pain and neck stiffness.  Skin: Negative for rash and wound.  Neurological: Negative for light-headedness and headaches.  Psychiatric/Behavioral: Negative for agitation.     Physical  Exam Updated Vital Signs BP (!) 186/111 (BP Location: Right Arm)   Pulse (!) 101   Temp 98.3 F (36.8 C) (Oral)   Resp (!) 22   Ht 5\' 2"  (1.575 m)   Wt (!) 150 kg   LMP 05/02/2017   SpO2 100%   BMI 60.48 kg/m   Physical Exam Vitals signs and nursing note reviewed.  Constitutional:      General: She is not in acute distress.    Appearance: She is well-developed. She is not ill-appearing, toxic-appearing or diaphoretic.  HENT:     Head: Normocephalic and atraumatic.  Eyes:     Conjunctiva/sclera: Conjunctivae normal.     Pupils: Pupils are equal, round, and reactive to light.  Neck:     Musculoskeletal: Neck supple.  Cardiovascular:     Rate and Rhythm: Regular rhythm. Tachycardia present.     Pulses: Normal pulses.     Heart sounds: No murmur.  Pulmonary:     Effort: Pulmonary effort is normal. Tachypnea present. No respiratory distress.     Breath sounds: Normal breath sounds. No decreased breath sounds, wheezing, rhonchi or rales.  Chest:     Chest wall: No mass or tenderness.  Abdominal:     Palpations: Abdomen is soft.     Tenderness: There is no abdominal tenderness.  Musculoskeletal:     Right lower leg: She exhibits no tenderness. Edema present.     Left lower leg: She exhibits no tenderness. Edema present.  Skin:    General: Skin is warm and dry.     Capillary Refill: Capillary refill takes less than 2 seconds.     Findings: No rash.  Neurological:     General: No focal deficit present.     Mental Status: She is alert.      ED Treatments / Results  Labs (all labs ordered are listed, but only abnormal results are displayed) Labs Reviewed  TROPONIN I - Abnormal; Notable for the following components:      Result Value   Troponin I 0.03 (*)    All other components within normal limits  BRAIN NATRIURETIC PEPTIDE - Abnormal; Notable for the following components:   B Natriuretic Peptide 147.2 (*)  All other components within normal limits  D-DIMER,  QUANTITATIVE (NOT AT [facility]) - Abnormal; Notable for the following components:   D-Dimer, Quant 0.57 (*)    All other components within normal limits  COMPREHENSIVE METABOLIC PANEL - Abnormal; Notable for the following components:   CO2 33 (*)    Glucose, Bld 101 (*)    Creatinine, Ser 1.12 (*)    Total Protein 5.9 (*)    Albumin 3.3 (*)    ALT 58 (*)    GFR calc non Af Amer 56 (*)    All other components within normal limits  TROPONIN I - Abnormal; Notable for the following components:   Troponin I 0.03 (*)    All other components within normal limits  POCT I-STAT 7, (LYTES, BLD GAS, ICA,H+H) - Abnormal; Notable for the following components:   pCO2 arterial 64.9 (*)    pO2, Arterial 195.0 (*)    Bicarbonate 36.6 (*)    TCO2 39 (*)    Acid-Base Excess 9.0 (*)    All other components within normal limits  SARS CORONAVIRUS 2 (HOSPITAL ORDER, [facility])  CBC WITH DIFFERENTIAL/PLATELET  LACTIC ACID, PLASMA  LACTIC ACID, PLASMA  BLOOD GAS, ARTERIAL    EKG EKG Interpretation  Date/Time:  Sunday Sep 15 2018 08:40:06 EDT Ventricular Rate:  100 PR Interval:    QRS Duration: 89 QT Interval:  355 QTC Calculation: 458 R Axis:   -13 Text Interpretation:  Sinus tachycardia Consider right atrial enlargement Abnormal R-wave progression, late transition When compared to prior, similar tachycardia.  No STEMI Confirmed by Antony Blackbird [phone]) on 09/15/2018 8:43:12 AM   Radiology Ct Angio Chest Pe W And/or Wo Contrast  Result Date: 09/15/2018 CLINICAL DATA:  PE suspected, intermediate probability, positive D-dimer. EXAM: CT ANGIOGRAPHY CHEST WITH CONTRAST TECHNIQUE: Multidetector CT imaging of the chest was performed using the standard protocol during bolus administration of intravenous contrast. Multiplanar CT image reconstructions and MIPs were obtained to evaluate the vascular anatomy. CONTRAST:  132mL OMNIPAQUE IOHEXOL 350 MG/ML SOLN COMPARISON:  One-view chest  x-ray 09/15/2018 FINDINGS: Cardiovascular: The heart is enlarged. Atherosclerotic calcifications are present in the aortic arch. Arch and great vessel origins are otherwise normal. There is no aneurysm or focal stenosis. Pulmonary artery opacification is satisfactory. No focal filling defects are present to suggest pulmonary embolus. Pulmonary artery size is within normal limits. Coronary artery calcifications are present. Mediastinum/Nodes: No significant mediastinal, hilar, or axillary adenopathy is present. 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 Esophagus is unremarkable. Lungs/Pleura: Innumerable bilateral pulmonary nodules are again seen. Nodules are similar in size to the prior exam. In the absence treatment, this makes neoplasm less likely. This may be related to chronic inflammation. No significant pleural effusion is present. Upper Abdomen: There is diffuse fatty infiltration liver. No discrete lesions are present. Limited imaging of the abdomen is otherwise unremarkable. Musculoskeletal: Degenerative changes of thoracic spine are stable. No focal lytic or blastic lesions are present. Ribs are unremarkable. Review of the MIP images confirms the above findings. IMPRESSION: 1. No pulmonary embolus. 2. No acute abnormality. 3. Stable appearance of multiple bilateral pulmonary nodules. The absence of interval treatment, neoplasm is less likely. Electronically Signed   By: San Morelle M.D.   On: 09/15/2018 11:56   Dg Chest Portable 1 View  Result Date: 09/15/2018 CLINICAL DATA:  Hypoxia. EXAM: PORTABLE CHEST 1 VIEW COMPARISON:  One-view chest x-ray 08/20/2018 FINDINGS: The heart is enlarged. This is exaggerated by low lung volumes.  Mild interstitial edema is present. No definite effusions are present. The visualized soft tissues and bony thorax are unremarkable. IMPRESSION: 1. Cardiomegaly and edema suggesting congestive heart failure. Electronically Signed   By: San Morelle M.D.   On:  09/15/2018 09:19    Procedures Procedures (including critical care time)  Medications Ordered in ED Medications  acetaminophen (TYLENOL) tablet 650 mg (has no administration in time range)  alum & mag hydroxide-simeth (MAALOX/MYLANTA) 200-200-20 MG/5ML suspension 30 mL (has no administration in time range)  magnesium hydroxide (MILK OF MAGNESIA) suspension 30 mL (has no administration in time range)  fluticasone (FLONASE) 50 MCG/ACT nasal spray 2 spray (2 sprays Each Nare Given 09/14/18 0845)  levothyroxine (SYNTHROID) tablet 75 mcg (75 mcg Oral Given 09/15/18 0647)  loratadine (CLARITIN) tablet 10 mg (10 mg Oral Given 09/14/18 0845)  pantoprazole (PROTONIX) EC tablet 40 mg (40 mg Oral Given 09/14/18 0847)  potassium chloride SA (K-DUR) CR tablet 40 mEq (40 mEq Oral Given 09/14/18 0846)  predniSONE (DELTASONE) tablet 5 mg (5 mg Oral Given 09/14/18 0845)  LORazepam (ATIVAN) tablet 1 mg (1 mg Oral Not Given 09/15/18 0400)    Or  LORazepam (ATIVAN) injection 1 mg ( Intramuscular See Alternative 09/15/18 0400)  labetalol (NORMODYNE) tablet 600 mg (600 mg Oral Not Given 09/12/18 2300)  nystatin (MYCOSTATIN/NYSTOP) topical powder ( Topical Given 09/14/18 1656)  divalproex (DEPAKOTE) DR tablet 250 mg (250 mg Oral Given 09/14/18 2031)  perphenazine (TRILAFON) tablet 4 mg (4 mg Oral Given 09/14/18 1656)  cloNIDine (CATAPRES) tablet 0.2 mg (0.2 mg Oral Given 09/14/18 1656)  LORazepam (ATIVAN) tablet 2 mg (2 mg Oral See Procedure Record 09/12/18 1648)  amLODipine (NORVASC) tablet 10 mg (10 mg Oral Given 09/12/18 1648)  cloNIDine (CATAPRES) tablet 0.2 mg (0.2 mg Oral Given 09/12/18 1648)  lisinopril (ZESTRIL) tablet 40 mg (40 mg Oral Given 09/12/18 1647)  OLANZapine zydis (ZYPREXA) disintegrating tablet 5 mg (5 mg Oral Given 09/12/18 1647)  iohexol (OMNIPAQUE) 350 MG/ML injection 100 mL (100 mLs Intravenous Contrast Given 09/15/18 1142)     Initial Impression / Assessment and Plan / ED Course  I have reviewed  the triage vital signs and the nursing notes.  Pertinent labs & imaging results that were available during my care of the patient were reviewed by me and considered in my medical decision making (see chart for details).        Madison Palmer is a 54 y.o. female with a past medical history significant for hypertension, hyperlipidemia, GERD, diverticulitis, thyroid disease, depression, anxiety, sleep apnea requiring CPAP and currently admitted under IVC to [facility] for psychosis and delusions who presents with hypoxia.  Patient reports that she usually sleeps with CPAP at night but has not been on CPAP since she is been admitted for the last few days.  She was sent over from behavioral health [facility] for hypoxia of 80% this morning.  Patient denies any symptoms currently aside from some mild edema in her legs which is worse than her normal.  She denies any shortness of breath, palpitations, chest pain, nausea, vomiting, fevers, chills, cough, rhinorrhea, abdominal pain, back pain, urinary symptoms or GI symptoms.  She reports feeling fine currently.  She is on 3 L keeping her oxygen saturation in the 90s.  Chart review shows the patient has had history of hypoxia when she is not on her home CPAP.  On arrival, patient is tachycardic with a heart rate in the 120s, hypertensive with blood pressure in the  180s, tachypneic in the 20s to 30s, but afebrile.  Due to patient's report of edema in the legs and the hypoxia, will have work-up to look for fluid overload.  Will have a d-dimer to look for a thromboembolic cause of symptoms.  Will have chest x-ray.  Patient had negative coronavirus test 3 days ago however as anticipate she will need admission if CPAP is unavailable at her current behavioral health hospital, coronavirus test will be repeated.  EKG similar to prior with no STEMI.  Anticipate reassessment after work-up.   10:54 AM Initial troponin elevated 0.03.  BNP elevated at 147 and d-dimer elevated  0.57.  Will obtain CT PE study to clarify to make sure there is no pulmonary embolism.  Chest x-ray shows edema and concern for heart failure.  Given the patient's body habitus I am concerned that the BNP only mildly elevated is not indicative of her degree of heart failure.  CT scan will also help look at pulmonary edema.  12:41 PM Patient's troponin at 0.03 similar to prior at 0.04 in January.  BNP is slightly improved at 147 down from 199 in January.  D-dimer was elevated so PE study was ordered.  CT scan shows no pneumonia or pulmonary embolism.  Due to her work-up being indicative of CHF but labs being similar to prior, patient will be weaned off of oxygen and see what her oxygen saturation does.  If her oxygen saturations drop and she needs oxygen, she will need to be admitted at this facility to continue her psychiatric management.  If her oxygen saturations are maintained, the facility reports that they will be able to have her on CPAP at night.  Anticipate following up on pulse oximetry after weaning.  1:51 PM Second troponin was unchanged at 0.03 however after her oxygen was turned off, her oxygen saturation dropped to 83% on room air with no exertion.  As patient needs oxygen, she will need to be admitted.  Suspect component of CHF causing her hypoxia.   Medicine team called for admission as patient needs oxygen and cannot go back to behavioral health hospital with oxygen requirement.  Final Clinical Impressions(s) / ED Diagnoses   Final diagnoses:  Hypoxia  Acute on chronic congestive heart failure, unspecified heart failure type [facility])    ED Discharge Orders    None     Clinical Impression: 1. Hypoxia   2. Acute on chronic congestive heart failure, unspecified heart failure type ([place])     Disposition: Admit  This note was prepared with assistance of Dragon voice recognition software. Occasional wrong-word or sound-a-like substitutions may have occurred due to the inherent  limitations of voice recognition software.     Tegeler, Gwenyth Allegra, MD 09/15/18 [phone]

## 2018-09-15 NOTE — H&P (Signed)
History and Physical    ABI SHOULTS NWG:[id number] DOB: Sep 22, 1965 DOA: (Not on file)  Referring MD/NP/PA: Marda Stalker, MD PCP: Flossie Buffy, NP  Patient coming from: Behavioral health  Chief Complaint: Hypoxia  I have personally briefly reviewed patient's old medical records in [place]   HPI: Madison Palmer is a 53 y.o. female with medical history significant of hyperparathyroidism, hypothyroidism, OSA, on CPAP, diastolic CHF last EF noted to be 6065% with grade 1 diastolic dysfunction in 57/8469, chronic lymphedema, and pulmonary nodules; who presents with hypoxia and was currently under IVC to behavioral health for psychosis with delusions since 5/14.  Per review of records notes previously sent home with 2 L of nasal cannula oxygen and supposed to be on CPAP at night.  However, since being at behavioral health she had not been receiving her CPAP and had not been on oxygen.  At behavioral health O2 saturations were noted to be 80% this morning on room air that she was sent to the emergency department for further evaluation.  Patient notes some lower leg swelling is slightly worse than normal.  Denies feeling short of breath at this time or having any other significant complaints at this time.  ED Course: On admission to the emergency department patient was noted to be afebrile, pulse anywhere from 77-1 20, blood pressures elevated up to 227/177.  Patient O2 saturations noted as low as 82% on room air, and maintained greater than 92% on 2 L nasal cannula oxygen.  Labs revealed CO2 33, creatinine 1.12, d-dimer 0.57, troponin 0.03, BNP 147.2.  COVID-19 testing negative.  ABG revealed pH 7.359, PCO2 64.9, and PO2 195 on 2 L.  CT angiogram of the chest was obtained but negative for any signs of a pulmonary embolus and did note multiple pulmonary nodules.  Patient was involuntary committed by ED physician. [facility] called to admit for concern for hypoxia and possible congestive  heart failure exacerbation.  Review of Systems  Constitutional: Negative for fever.  Respiratory: Positive for shortness of breath. Negative for cough.   Cardiovascular: Positive for leg swelling. Negative for chest pain.  Psychiatric/Behavioral: Negative for suicidal ideas.  All other systems reviewed and are negative.   Past Medical History:  Diagnosis Date   Anemia    Anxiety    Arrhythmia    tachycardia   Arthritis    Chickenpox    Depression    Diverticulitis    GERD (gastroesophageal reflux disease)    Glaucoma    Hyperlipidemia    Hyperparathyroidism ([place])    Hypertension    Inflammatory polyps of colon ([place])    LVH (left ventricular hypertrophy)    Lymphedema    PCOS (polycystic ovarian syndrome)    Prediabetes    Recurrent UTI    Sleep apnea    CPAP   Thyroid disease    Vitamin D deficiency     Past Surgical History:  Procedure Laterality Date   BREAST BIOPSY  2015   CESAREAN SECTION  2004   INNER EAR SURGERY     ear and sinus surgery   LAPAROSCOPIC REPAIR AND REMOVAL OF GASTRIC BAND     OOPHORECTOMY Left    TONSILLECTOMY AND ADENOIDECTOMY       reports that she has never smoked. She has never used smokeless tobacco. She reports current alcohol use. She reports that she does not use drugs.  Allergies  Allergen Reactions   Fentanyl Hives   Levofloxacin Hives   Midazolam  Hives   Pollen Extract     seasonal   Atorvastatin     Muscle pain in legs    Doxycycline Itching and Swelling   Hydralazine Hcl Other (See Comments)    Hypercalcemia    Rosuvastatin     Abdominal pain    Family History  Adopted: Yes  Problem Relation Age of Onset   Hearing loss Son        right    Healthy Son     Prior to Admission medications   Medication Sig Start Date End Date Taking? Authorizing Provider  acetaminophen (TYLENOL) 500 MG tablet Take 1,000 mg by mouth 3 (three) times daily as needed for moderate pain or headache.     [provider]  amLODipine (NORVASC) 10 MG tablet Take 10 mg by mouth daily. 09/05/18 10/05/18  [provider]  benzonatate (TESSALON) 100 MG capsule Take 1 capsule (100 mg total) by mouth 3 (three) times daily as needed for cough. Patient not taking: Reported on 08/19/2018 05/21/18   [name], Charlene Brooke, NP  cetirizine (ZYRTEC ALLERGY) 10 MG tablet Take 1 tablet (10 mg total) by mouth daily. 03/13/18   [name], Charlene Brooke, NP  cloNIDine (CATAPRES) 0.2 MG tablet Take 0.2 mg by mouth 2 (two) times a day. 09/04/18 10/04/18  [provider]  divalproex (DEPAKOTE) 500 MG DR tablet Take 500 mg by mouth 2 (two) times a day. 09/04/18 10/04/18  [provider]  esomeprazole (NEXIUM) 40 MG capsule Take 1 capsule (40 mg total) by mouth 2 (two) times daily. 08/15/18   Mansouraty, Telford Nab., MD  fluticasone (FLONASE) 50 MCG/ACT nasal spray Place 2 sprays into both nostrils daily.    [provider]  furosemide (LASIX) 40 MG tablet Take 1 tablet (40 mg total) by mouth 2 (two) times daily. 06/24/18   Minus Breeding, MD  hydrALAZINE (APRESOLINE) 100 MG tablet Take 100 mg by mouth 3 (three) times daily. 09/04/18 10/04/18  [provider]  hydrochlorothiazide (HYDRODIURIL) 50 MG tablet Take 50 mg by mouth daily.    [provider]  ibuprofen (ADVIL) 200 MG tablet Take 400 mg by mouth every 6 (six) hours as needed for moderate pain.    [provider]  labetalol (NORMODYNE) 300 MG tablet Take 600 mg by mouth 2 (two) times a day. 09/04/18 10/04/18  [provider]  levothyroxine (SYNTHROID) 75 MCG tablet Take 75 mcg by mouth as directed. 5 days weekly    [provider]  lisinopril (ZESTRIL) 40 MG tablet Take 40 mg by mouth daily.    [provider]  Lurasidone HCl 60 MG TABS Take 60 mg by mouth daily after supper. 09/04/18   [provider]  metFORMIN (GLUCOPHAGE) 500 MG tablet Take 500 mg by mouth 2 (two) times daily with a  meal.    [provider]  potassium chloride SA (K-DUR,KLOR-CON) 20 MEQ tablet Take 2 tablets (40 mEq total) by mouth daily. 06/24/18   Minus Breeding, MD  predniSONE (DELTASONE) 5 MG tablet Take 1 tablet (5 mg total) by mouth daily. Patient not taking: Reported on 09/12/2018 08/16/18   Rigoberto Noel, MD  Vitamin D, Ergocalciferol, (DRISDOL) 1.25 MG (50000 UT) CAPS capsule TAKE 1 CAPSULE BY MOUTH EVERY 7 DAYS Patient not taking: Reported on 09/12/2018 09/05/18   [place], Joneen Boers, FNP    Physical Exam:  Constitutional: Morbidly obese female NAD, calm, comfortable Vitals:   09/15/18 2036  BP: (!) 170/100  Weight: (!) 140.3  kg  Height: 5\' 3"  (1.6 m)   Eyes: PERRL, lids and conjunctivae normal ENMT: Mucous membranes are moist. Posterior pharynx clear of any exudate or lesions.Normal dentition.  Neck: normal, supple, no masses, no thyromegaly Respiratory: Decreased overall aeration with no significant crackles appreciated.  Patient on 2 L nasal cannula oxygen. Cardiovascular: Regular rate and rhythm, no murmurs / rubs / gallops. Lymphedema . 2+ pedal pulses. No carotid bruits.  Abdomen: no tenderness, no masses palpated. No hepatosplenomegaly. Bowel sounds positive.  Musculoskeletal: no clubbing / cyanosis. No joint deformity upper and lower extremities. Good ROM, no contractures. Normal muscle tone.  Skin: no rashes, lesions, ulcers. No induration Neurologic: CN 2-12 grossly intact. Sensation intact, DTR normal. Strength 5/5 in all 4.  Psychiatric:  Alert and oriented x 3.  Depressed mood.     Labs on Admission: I have personally reviewed following labs and imaging studies  CBC: Recent Labs  Lab 09/11/18 2224 09/15/18 0918 09/15/18 1551  WBC 6.6 5.0  --   NEUTROABS 4.6 3.0  --   HGB 13.3 13.7 12.9  HCT 41.6 43.7 38.0  MCV 93.1 92.6  --   PLT 219 204  --    Basic Metabolic Panel: Recent Labs  Lab 09/11/18 2224 09/15/18 0918 09/15/18 1551  NA 144 144 140  K 3.7 3.8  3.7  CL 106 100  --   CO2 28 33*  --   GLUCOSE 120* 101*  --   BUN 27* 8  --   CREATININE 1.07* 1.12*  --   CALCIUM 10.1 10.2  --    GFR: Estimated Creatinine Clearance: 83.6 mL/min (A) (by C-G formula based on SCr of 1.12 mg/dL (H)). Liver Function Tests: Recent Labs  Lab 09/11/18 2224 09/15/18 0918  AST 34 26  ALT 82* 58*  ALKPHOS 79 73  BILITOT 0.7 1.2  PROT 6.8 5.9*  ALBUMIN 3.8 3.3*   No results for input(s): LIPASE, AMYLASE in the last 168 hours. No results for input(s): AMMONIA in the last 168 hours. Coagulation Profile: No results for input(s): INR, PROTIME in the last 168 hours. Cardiac Enzymes: Recent Labs  Lab 09/15/18 0918 09/15/18 1248  TROPONINI 0.03* 0.03*   BNP (last 3 results) Recent Labs    02/20/18 1536  PROBNP 173.0*   HbA1C: No results for input(s): HGBA1C in the last 72 hours. CBG: No results for input(s): GLUCAP in the last 168 hours. Lipid Profile: No results for input(s): CHOL, HDL, LDLCALC, TRIG, CHOLHDL, LDLDIRECT in the last 72 hours. Thyroid Function Tests: No results for input(s): TSH, T4TOTAL, FREET4, T3FREE, THYROIDAB in the last 72 hours. Anemia Panel: No results for input(s): VITAMINB12, FOLATE, FERRITIN, TIBC, IRON, RETICCTPCT in the last 72 hours. Urine analysis:    Component Value Date/Time   BILIRUBINUR NEGATIVE 05/21/2018 1332   PROTEINUR Negative 05/21/2018 1332   UROBILINOGEN 1.0 05/21/2018 1332   NITRITE NEGATIVE 05/21/2018 1332   LEUKOCYTESUR Negative 05/21/2018 1332   Sepsis Labs: Recent Results (from the past 240 hour(s))  SARS Coronavirus 2 (CEPHEID - Performed in [facility] hospital lab), Hosp Order     Status: None   Collection Time: 09/12/18  1:39 PM  Result Value Ref Range Status   SARS Coronavirus 2 NEGATIVE NEGATIVE Final    Comment: (NOTE) If result is NEGATIVE SARS-CoV-2 target nucleic acids are NOT DETECTED. The SARS-CoV-2 RNA is generally detectable in upper and lower  respiratory specimens  during the acute phase of infection. The lowest  concentration of SARS-CoV-2  viral copies this assay can detect is 250  copies / mL. A negative result does not preclude SARS-CoV-2 infection  and should not be used as the sole basis for treatment or other  patient management decisions.  A negative result may occur with  improper specimen collection / handling, submission of specimen other  than nasopharyngeal swab, presence of viral mutation(s) within the  areas targeted by this assay, and inadequate number of viral copies  (<250 copies / mL). A negative result must be combined with clinical  observations, patient history, and epidemiological information. If result is POSITIVE SARS-CoV-2 target nucleic acids are DETECTED. The SARS-CoV-2 RNA is generally detectable in upper and lower  respiratory specimens dur ing the acute phase of infection.  Positive  results are indicative of active infection with SARS-CoV-2.  Clinical  correlation with patient history and other diagnostic information is  necessary to determine patient infection status.  Positive results do  not rule out bacterial infection or co-infection with other viruses. If result is PRESUMPTIVE POSTIVE SARS-CoV-2 nucleic acids MAY BE PRESENT.   A presumptive positive result was obtained on the submitted specimen  and confirmed on repeat testing.  While 2019 novel coronavirus  (SARS-CoV-2) nucleic acids may be present in the submitted sample  additional confirmatory testing may be necessary for epidemiological  and / or clinical management purposes  to differentiate between  SARS-CoV-2 and other Sarbecovirus currently known to infect humans.  If clinically indicated additional testing with an alternate test  methodology [phone]) is advised. The SARS-CoV-2 RNA is generally  detectable in upper and lower respiratory sp ecimens during the acute  phase of infection. The expected result is Negative. Fact Sheet for Patients:   StrictlyIdeas.no Fact Sheet for Healthcare Providers: BankingDealers.co.za This test is not yet approved or cleared by the [place] FDA and has been authorized for detection and/or diagnosis of SARS-CoV-2 by FDA under an Emergency Use Authorization (EUA).  This EUA will remain in effect (meaning this test can be used) for the duration of the COVID-19 declaration under Section 564(b)(1) of the Act, 21 U.S.C. section 360bbb-3(b)(1), unless the authorization is terminated or revoked sooner. Performed at [facility], [place] [address], [place], [place] [address]   SARS Coronavirus 2 (CEPHEID - Performed in [place] hospital lab), Hosp Order     Status: None   Collection Time: 09/15/18  9:18 AM  Result Value Ref Range Status   SARS Coronavirus 2 NEGATIVE NEGATIVE Final    Comment: (NOTE) If result is NEGATIVE SARS-CoV-2 target nucleic acids are NOT DETECTED. The SARS-CoV-2 RNA is generally detectable in upper and lower  respiratory specimens during the acute phase of infection. The lowest  concentration of SARS-CoV-2 viral copies this assay can detect is 250  copies / mL. A negative result does not preclude SARS-CoV-2 infection  and should not be used as the sole basis for treatment or other  patient management decisions.  A negative result may occur with  improper specimen collection / handling, submission of specimen other  than nasopharyngeal swab, presence of viral mutation(s) within the  areas targeted by this assay, and inadequate number of viral copies  (<250 copies / mL). A negative result must be combined with clinical  observations, patient history, and epidemiological information. If result is POSITIVE SARS-CoV-2 target nucleic acids are DETECTED. The SARS-CoV-2 RNA is generally detectable in upper and lower  respiratory specimens dur ing the acute phase of infection.  Positive  results are indicative  of  active infection with SARS-CoV-2.  Clinical  correlation with patient history and other diagnostic information is  necessary to determine patient infection status.  Positive results do  not rule out bacterial infection or co-infection with other viruses. If result is PRESUMPTIVE POSTIVE SARS-CoV-2 nucleic acids MAY BE PRESENT.   A presumptive positive result was obtained on the submitted specimen  and confirmed on repeat testing.  While 2019 novel coronavirus  (SARS-CoV-2) nucleic acids may be present in the submitted sample  additional confirmatory testing may be necessary for epidemiological  and / or clinical management purposes  to differentiate between  SARS-CoV-2 and other Sarbecovirus currently known to infect humans.  If clinically indicated additional testing with an alternate test  methodology [id number]) is advised. The SARS-CoV-2 RNA is generally  detectable in upper and lower respiratory sp ecimens during the acute  phase of infection. The expected result is Negative. Fact Sheet for Patients:  StrictlyIdeas.no Fact Sheet for Healthcare Providers: BankingDealers.co.za This test is not yet approved or cleared by the [place] FDA and has been authorized for detection and/or diagnosis of SARS-CoV-2 by FDA under an Emergency Use Authorization (EUA).  This EUA will remain in effect (meaning this test can be used) for the duration of the COVID-19 declaration under Section 564(b)(1) of the Act, 21 U.S.C. section 360bbb-3(b)(1), unless the authorization is terminated or revoked sooner. Performed at [facility], [place] [address] [place], [place] [address]      Radiological Exams on Admission: Ct Angio Chest Pe W And/or Wo Contrast  Result Date: 09/15/2018 CLINICAL DATA:  PE suspected, intermediate probability, positive D-dimer. EXAM: CT ANGIOGRAPHY CHEST WITH CONTRAST TECHNIQUE: Multidetector CT imaging of the chest was  performed using the standard protocol during bolus administration of intravenous contrast. Multiplanar CT image reconstructions and MIPs were obtained to evaluate the vascular anatomy. CONTRAST:  157mL OMNIPAQUE IOHEXOL 350 MG/ML SOLN COMPARISON:  One-view chest x-ray 09/15/2018 FINDINGS: Cardiovascular: The heart is enlarged. Atherosclerotic calcifications are present in the aortic arch. Arch and great vessel origins are otherwise normal. There is no aneurysm or focal stenosis. Pulmonary artery opacification is satisfactory. No focal filling defects are present to suggest pulmonary embolus. Pulmonary artery size is within normal limits. Coronary artery calcifications are present. Mediastinum/Nodes: No significant mediastinal, hilar, or axillary adenopathy is present. 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 Esophagus is unremarkable. Lungs/Pleura: Innumerable bilateral pulmonary nodules are again seen. Nodules are similar in size to the prior exam. In the absence treatment, this makes neoplasm less likely. This may be related to chronic inflammation. No significant pleural effusion is present. Upper Abdomen: There is diffuse fatty infiltration liver. No discrete lesions are present. Limited imaging of the abdomen is otherwise unremarkable. Musculoskeletal: Degenerative changes of thoracic spine are stable. No focal lytic or blastic lesions are present. Ribs are unremarkable. Review of the MIP images confirms the above findings. IMPRESSION: 1. No pulmonary embolus. 2. No acute abnormality. 3. Stable appearance of multiple bilateral pulmonary nodules. The absence of interval treatment, neoplasm is less likely. Electronically Signed   By: San Morelle M.D.   On: 09/15/2018 11:56   Dg Chest Portable 1 View  Result Date: 09/15/2018 CLINICAL DATA:  Hypoxia. EXAM: PORTABLE CHEST 1 VIEW COMPARISON:  One-view chest x-ray 08/20/2018 FINDINGS: The heart is enlarged. This is exaggerated by low lung volumes. Mild  interstitial edema is present. No definite effusions are present. The visualized soft tissues and bony thorax are unremarkable. IMPRESSION: 1. Cardiomegaly and edema suggesting congestive  heart failure. Electronically Signed   By: San Morelle M.D.   On: 09/15/2018 09:19    EKG: Independently reviewed. Sinus Tachycardia 100 bpm  Assessment/Plan Chronic respiratory failure with hypercapnia and hypoxia, OSA: Patient noted to be hypoxic into the 80s on room air.  It appears she is supposed to be on at least 2 L of nasal cannula oxygen 24/7 from hospitalization in 05/2018.  ABG revealed normal pH with PCO2 elevated 64.9. -Admit to a progressive bed -Continuous pulse oximetry with 2 L nasal cannula oxygen -Duonebs as needed -CPAP at night  Involuntary committed, bipolar disorder 1with psychosis: Acute.  Patient had just recently been admitted to behavioral health for psychosis on 5/14.  However, reportedly unable to be discharged to behavioral health oxygen or on CPAP at night.  Involuntary commitment order signed by ED physician. -Social work consulted for help -Discuss in a.m. with psychiatry -Continue 1:1 sitter -Continue clonidine 0.2 mg p.o. twice daily -Continue Depakote 250 mg p.o. twice daily for mood stability -Continue perphenazine 4 mg p.o. 3 times daily for psychosis Continue Ativan 1 mg p.o. every 4 hours  Hypertensive urgency: systolic blood pressures elevated greater than 200 on admission.  Suspect likely related with recent missed doses of home blood pressure medications. -Restart home medications of clonidine, labetalol, hydralazine, and lisinopril  Chronic diastolic congestive heart failure:  Last EF noted to be 60-65% with mild pulmonary hypertension on 03/2018. BNP 147.2 which is lower than previous. CXR concerning for mild pulmonary edema, but CT angiogram negative for PE or significant edema.  Possible mild exacerbation. -Strict intake and output -Daily weights -IV  Lasix 40 mg x 1 dose in ED -Continue home 40 mg twice daily in a.m.  Lymphedema: Chronic. -Recommend follow-up in lymphedema clinic  Hypothyroidism and hypoparathyroidism: Last TSH noted to be 0.938 in 05/2018. -Recheck TSH in a.m. -Continue levothyroxine  Pulmonary nodules: CT angiogram did note pulmonary nodules.  Followed by pulmonology.  Questioned possibly to be related to sarcoidosis. -Continue outpatient follow-up with [facility]  Morbid obesity: BMI 60.48 kg/m.   DVT prophylaxis: lovenox Code Status: Full Family Communication: No family present bedside Disposition Plan: To be determined Consults called: None Admission status: Observation  Norval Morton MD [facility] Pager [phone]   If 7PM-7AM, please contact night-coverage [email or web address] Password [facility]  09/15/2018, 6:41 PM

## 2018-09-15 NOTE — ED Notes (Signed)
Spoke with Dr. Gustavus Messing requards [facility] medications.  States to hold same u ntil workup more clear. States he will inform this nurse when medications resume or other meds  Needed.

## 2018-09-15 NOTE — ED Notes (Signed)
Pt begins yelling and trys to crawl off end of bed. Pt calms with voice and is redirectible.  Pt repositioned in be d and new linen given.

## 2018-09-15 NOTE — ED Notes (Signed)
Sitter remains at bedside. Pt remains calm and cooperative.

## 2018-09-15 NOTE — ED Notes (Signed)
Pt placed in rm [id number]. Approved at [id number].

## 2018-09-16 DIAGNOSIS — F29 Unspecified psychosis not due to a substance or known physiological condition: Secondary | ICD-10-CM

## 2018-09-16 DIAGNOSIS — F3189 Other bipolar disorder: Secondary | ICD-10-CM

## 2018-09-16 LAB — CBC
HCT: 42.7 % (ref 36.0–46.0)
Hemoglobin: 13.9 g/dL (ref 12.0–15.0)
MCH: 29.8 pg (ref 26.0–34.0)
MCHC: 32.6 g/dL (ref 30.0–36.0)
MCV: 91.4 fL (ref 80.0–100.0)
Platelets: 194 10*3/uL (ref 150–400)
RBC: 4.67 MIL/uL (ref 3.87–5.11)
RDW: 13.9 % (ref 11.5–15.5)
WBC: 6.1 10*3/uL (ref 4.0–10.5)
nRBC: 0 % (ref 0.0–0.2)

## 2018-09-16 LAB — TSH: TSH: 0.409 u[IU]/mL (ref 0.350–4.500)

## 2018-09-16 LAB — BASIC METABOLIC PANEL
Anion gap: 10 (ref 5–15)
BUN: 9 mg/dL (ref 6–20)
CO2: 32 mmol/L (ref 22–32)
Calcium: 9.6 mg/dL (ref 8.9–10.3)
Chloride: 96 mmol/L — ABNORMAL LOW (ref 98–111)
Creatinine, Ser: 0.87 mg/dL (ref 0.44–1.00)
GFR calc Af Amer: >60 mL/min (ref >60)
GFR calc non Af Amer: >60 mL/min (ref >60)
Glucose, Bld: 140 mg/dL — ABNORMAL HIGH (ref 70–99)
Potassium: 3.3 mmol/L — ABNORMAL LOW (ref 3.5–5.1)
Sodium: 138 mmol/L (ref 135–145)

## 2018-09-16 MED ORDER — AMLODIPINE BESYLATE 10 MG PO TABS
10.0000 mg | ORAL_TABLET | Freq: Every day | ORAL | Status: DC
  Administered 2018-09-17 – 2018-09-25 (×9): 10 mg via ORAL
  Filled 2018-09-16 (×9): qty 1

## 2018-09-16 MED ORDER — POTASSIUM CHLORIDE CRYS ER 20 MEQ PO TBCR
40.0000 meq | EXTENDED_RELEASE_TABLET | Freq: Two times a day (BID) | ORAL | Status: DC
  Administered 2018-09-16 – 2018-09-25 (×19): 40 meq via ORAL
  Filled 2018-09-16 (×19): qty 2

## 2018-09-16 MED ORDER — LABETALOL HCL 200 MG PO TABS
600.0000 mg | ORAL_TABLET | Freq: Two times a day (BID) | ORAL | Status: DC
  Administered 2018-09-17 – 2018-09-25 (×17): 600 mg via ORAL
  Filled 2018-09-16: qty 6
  Filled 2018-09-16 (×16): qty 3

## 2018-09-16 MED ORDER — PERPHENAZINE 4 MG PO TABS
8.0000 mg | ORAL_TABLET | Freq: Every day | ORAL | Status: DC
  Administered 2018-09-16 – 2018-09-23 (×8): 8 mg via ORAL
  Filled 2018-09-16: qty 2
  Filled 2018-09-16: qty 1
  Filled 2018-09-16 (×4): qty 2
  Filled 2018-09-16: qty 1
  Filled 2018-09-16 (×2): qty 2

## 2018-09-16 MED ORDER — PERPHENAZINE 4 MG PO TABS
4.0000 mg | ORAL_TABLET | Freq: Two times a day (BID) | ORAL | Status: DC
  Administered 2018-09-17 – 2018-09-24 (×14): 4 mg via ORAL
  Filled 2018-09-16 (×18): qty 1

## 2018-09-16 MED ORDER — PERPHENAZINE 4 MG PO TABS
4.0000 mg | ORAL_TABLET | Freq: Two times a day (BID) | ORAL | Status: DC
  Filled 2018-09-16: qty 1

## 2018-09-16 NOTE — Consult Note (Signed)
Telepsych Consultation   Reason for Consult:  "Delusion/psychosis evaluation-disposition evaluation" Referring Physician:  Dr. Heath Lark Location of Patient:  [facility] Location of Provider: [place] [facility]  Patient Identification: Madison Palmer MRN:  [id number] Principal Diagnosis: Bipolar 1 disorder ([place]) Diagnosis:  Principal Problem:   Bipolar 1 disorder ([place]) Active Problems:   Hypothyroidism   Morbid obesity ([place])   OSA on CPAP   Lymphatic edema   Chronic diastolic heart failure ([place])   Acute on chronic respiratory failure with hypoxia and hypercapnia ([place])   Total Time spent with patient: 1 hour  Subjective:   Madison Palmer is a 53 y.o. female patient admitted with chronic respiratory failure with hypercapnia and hypoxia.   HPI:   Per chart review, patient was admitted with chronic respiratory failure with hypercapnia and hypoxia. She was initially admitted to [facility] on 5/14 for psychosis but sent to the ED due to medical concern. Primary team reports that she has been appropriate and would like reevaluation to determine if need for inpatient psychiatric hospitalization. Home medications include Depakote 500 mg BID (reduced to 250 mg BID since hospitalization), Trilafon 4 mg TID, Ativan 1 mg q 4 hours (reduced to 1 mg q 4 hours PRN since hospitalization) and Clonidine 0.2 mg BID. Ativan and Trilafon (and Latuda discontinued) were started during her recent hospitalization at [facility].   On interview, Madison Palmer reports that her mood is good. She denies SI, HI or AVH. She denies paranoia. She reports feeling like she was "manic depressive" when she came into the hospital. She is oriented to person, place and time.  She becomes more disorganized in thought process throughout the interview. "[name] thinks he is my fiance. I didn't talk to [place]. I talked to you. Lowella Dandy is my child."  Past Psychiatric History: Bipolar disorder, schizoaffective disorder and anxiety.   Risk  to Self:  None. Denies SI.  Risk to Others:  None. Denies HI.  Prior Inpatient Therapy:  She was hospitalized at [facility] a few days ago for psychosis and discharged for medical concern due to low O2 sats.  Prior Outpatient Therapy:  She is followed by her outpatient provider.    Past Medical History:  Past Medical History:  Diagnosis Date  . Anemia   . Anxiety   . Arrhythmia    tachycardia  . Arthritis   . Chickenpox   . Depression   . Diverticulitis   . GERD (gastroesophageal reflux disease)   . Glaucoma   . Hyperlipidemia   . Hyperparathyroidism ([place])   . Hypertension   . Inflammatory polyps of colon ([place])   . LVH (left ventricular hypertrophy)   . Lymphedema   . PCOS (polycystic ovarian syndrome)   . Prediabetes   . Recurrent UTI   . Sleep apnea    CPAP  . Thyroid disease   . Vitamin D deficiency     Past Surgical History:  Procedure Laterality Date  . BREAST BIOPSY  2015  . CESAREAN SECTION  2004  . INNER EAR SURGERY     ear and sinus surgery  . LAPAROSCOPIC REPAIR AND REMOVAL OF GASTRIC BAND    . OOPHORECTOMY Left   . TONSILLECTOMY AND ADENOIDECTOMY     Family History:  Family History  Adopted: Yes  Problem Relation Age of Onset  . Hearing loss Son        right   . Healthy Son    Family Psychiatric  History: Denies  Social History:  Social  History   Substance and Sexual Activity  Alcohol Use Yes   Comment: social     Social History   Substance and Sexual Activity  Drug Use No    Social History   Socioeconomic History  . Marital status: Married    Spouse name: Not on file  . Number of children: 1  . Years of education: Not on file  . Highest education level: Not on file  Occupational History  . Occupation: Unemployed  Social Needs  . Financial resource strain: Not on file  . Food insecurity:    Worry: Not on file    Inability: Not on file  . Transportation needs:    Medical: Not on file    Non-medical: Not on file  Tobacco Use  .  Smoking status: Never Smoker  . Smokeless tobacco: Never Used  Substance and Sexual Activity  . Alcohol use: Yes    Comment: social  . Drug use: No  . Sexual activity: Not Currently  Lifestyle  . Physical activity:    Days per week: Not on file    Minutes per session: Not on file  . Stress: Not on file  Relationships  . Social connections:    Talks on phone: Not on file    Gets together: Not on file    Attends religious service: Not on file    Active member of club or organization: Not on file    Attends meetings of clubs or organizations: Not on file    Relationship status: Not on file  Other Topics Concern  . Not on file  Social History Narrative   Lives with son and companion.     Additional Social History: She lives with her mother, father and dog, Angie Fava. She has a caregiver that helps with bathing her. She denies alcohol or illicit drug use.     Allergies:   Allergies  Allergen Reactions  . Fentanyl Hives  . Levofloxacin Hives  . Midazolam Hives  . Pollen Extract     seasonal  . Atorvastatin     Muscle pain in legs   . Doxycycline Itching and Swelling  . Hydralazine Hcl Other (See Comments)    Hypercalcemia   . Rosuvastatin     Abdominal pain    Labs:  Results for orders placed or performed during the hospital encounter of 09/15/18 (from the past 48 hour(s))  CBC     Status: None   Collection Time: 09/16/18  3:01 AM  Result Value Ref Range   WBC 6.1 4.0 - 10.5 K/uL   RBC 4.67 3.87 - 5.11 MIL/uL   Hemoglobin 13.9 12.0 - 15.0 g/dL   HCT 42.7 36.0 - 46.0 %   MCV 91.4 80.0 - 100.0 fL   MCH 29.8 26.0 - 34.0 pg   MCHC 32.6 30.0 - 36.0 g/dL   RDW 13.9 11.5 - 15.5 %   Platelets 194 150 - 400 K/uL   nRBC 0.0 0.0 - 0.2 %    Comment: Performed at [facility], [place] [address] [place], [place] [address]  Basic metabolic panel     Status: Abnormal   Collection Time: 09/16/18  3:01 AM  Result Value Ref Range   Sodium 138 135 - 145 mmol/L   Potassium 3.3  (L) 3.5 - 5.1 mmol/L   Chloride 96 (L) 98 - 111 mmol/L   CO2 32 22 - 32 mmol/L   Glucose, Bld 140 (H) 70 - 99 mg/dL   BUN 9  6 - 20 mg/dL   Creatinine, Ser 0.87 0.44 - 1.00 mg/dL   Calcium 9.6 8.9 - 10.3 mg/dL   GFR calc non Af Amer >60 >60 mL/min   GFR calc Af Amer >60 >60 mL/min   Anion gap 10 5 - 15    Comment: Performed at [facility] [address] [place], [place] [address]  TSH     Status: None   Collection Time: 09/16/18  3:01 AM  Result Value Ref Range   TSH 0.409 0.350 - 4.500 uIU/mL    Comment: Performed by a 3rd Generation assay with a functional sensitivity of <=0.01 uIU/mL. Performed at [facility], [place] [address], [place], [place] [address]     Medications:  Current Facility-Administered Medications  Medication Dose Route Frequency Provider Last Rate Last Dose  . acetaminophen (TYLENOL) tablet 650 mg  650 mg Oral Q6H PRN Fuller Plan A, MD       Or  . acetaminophen (TYLENOL) suppository 650 mg  650 mg Rectal Q6H PRN Fuller Plan A, MD      . Derrill Memo ON 09/17/2018] amLODipine (NORVASC) tablet 10 mg  10 mg Oral Daily Schorr, Rhetta Mura, NP      . cloNIDine (CATAPRES) tablet 0.2 mg  0.2 mg Oral BID Tamala Julian, Rondell A, MD   0.2 mg at 09/16/18 0908  . divalproex (DEPAKOTE) DR tablet 250 mg  250 mg Oral BID Fuller Plan A, MD   250 mg at 09/16/18 0909  . enoxaparin (LOVENOX) injection 75 mg  0.5 mg/kg Subcutaneous Q24H Alvira Philips, [place]   75 mg at 09/15/18 2239  . fluticasone (FLONASE) 50 MCG/ACT nasal spray 2 spray  2 spray Each Nare Daily Fuller Plan A, MD   2 spray at 09/16/18 0907  . furosemide (LASIX) tablet 40 mg  40 mg Oral BID Fuller Plan A, MD   40 mg at 09/16/18 0908  . hydrALAZINE (APRESOLINE) tablet 100 mg  100 mg Oral TID Schorr, Rhetta Mura, NP   100 mg at 09/16/18 0907  . ipratropium-albuterol (DUONEB) 0.5-2.5 (3) MG/3ML nebulizer solution 3 mL  3 mL Nebulization Q4H PRN Fuller Plan A, MD      . Derrill Memo ON 09/17/2018] labetalol  (NORMODYNE) tablet 600 mg  600 mg Oral BID Schorr, Rhetta Mura, NP      . levothyroxine (SYNTHROID) tablet 75 mcg  75 mcg Oral Once per day on Mon Tue Wed Thu Fri Norval Morton, MD   75 mcg at 09/16/18 1856  . lisinopril (ZESTRIL) tablet 40 mg  40 mg Oral Daily Fuller Plan A, MD   40 mg at 09/16/18 0909  . loratadine (CLARITIN) tablet 10 mg  10 mg Oral Daily Fuller Plan A, MD   10 mg at 09/16/18 0908  . LORazepam (ATIVAN) tablet 1 mg  1 mg Oral Q4H PRN Fuller Plan A, MD   1 mg at 09/16/18 0135  . ondansetron (ZOFRAN) tablet 4 mg  4 mg Oral Q6H PRN Fuller Plan A, MD       Or  . ondansetron (ZOFRAN) injection 4 mg  4 mg Intravenous Q6H PRN Smith, Rondell A, MD      . perphenazine (TRILAFON) tablet 4 mg  4 mg Oral TID Fuller Plan A, MD   4 mg at 09/16/18 0909  . potassium chloride SA (K-DUR) CR tablet 40 mEq  40 mEq Oral BID Heath Lark D, DO   40 mEq at 09/16/18 0908  .  sodium chloride flush (NS) 0.9 % injection 3 mL  3 mL Intravenous Q12H Smith, Rondell A, MD      . sodium chloride flush (NS) 0.9 % injection 3 mL  3 mL Intravenous Q12H Smith, Rondell A, MD   3 mL at 09/15/18 2240    Musculoskeletal: Strength & Muscle Tone: No atrophy noted. Gait & Station: UTA since patient is lying in bed. Patient leans: N/A  Psychiatric Specialty Exam: Physical Exam  Nursing note and vitals reviewed. Constitutional: She is oriented to person, place, and time. She appears well-developed and well-nourished.  HENT:  Head: Normocephalic and atraumatic.  Neck: Normal range of motion.  Respiratory: Effort normal.  Musculoskeletal: Normal range of motion.  Neurological: She is alert and oriented to person, place, and time.  Psychiatric: She has a normal mood and affect. Her behavior is normal. Judgment and thought content normal. Her speech is tangential. Cognition and memory are normal.    Review of Systems  Cardiovascular: Negative for chest pain.  Gastrointestinal: Negative for  abdominal pain, constipation, diarrhea, nausea and vomiting.  Psychiatric/Behavioral: Negative for hallucinations, substance abuse and suicidal ideas.  All other systems reviewed and are negative.   Blood pressure (!) 148/91, pulse 80, temperature (!) 97.5 F (36.4 C), temperature source Oral, resp. rate (!) 24, height 5\' 3"  (1.6 m), weight (!) 140 kg, last menstrual period 05/02/2017, SpO2 99 %.Body mass index is 54.67 kg/m.  General Appearance: Fairly Groomed, middle aged, African American female, wearing a hospital gown with [place] who is lying in bed. NAD.   Eye Contact:  Good  Speech:  Clear and Coherent and Slow  Volume:  Decreased  Mood:  Euthymic  Affect:  Constricted  Thought Process:  Disorganized and Descriptions of Associations: Tangential  Orientation:  Full (Time, Place, and Person)  Thought Content:  Logical  Suicidal Thoughts:  No  Homicidal Thoughts:  No  Memory:  Immediate;   Good Recent;   Good Remote;   Good  Judgement:  Poor  Insight:  Poor  Psychomotor Activity:  Decreased  Concentration:  Concentration: Good and Attention Span: Good  Recall:  Good  Fund of Knowledge:  Good  Language:  Good  Akathisia:  No  Handed:  Right  AIMS (if indicated):   N/A  Assets:  Communication Skills Housing Resilience Social Support  ADL's:  Impaired  Cognition:  Impaired due to psychiatric condition.   Sleep:   N/A   Assessment:  Madison Palmer is a 53 y.o. female who was admitted with chronic respiratory failure with hypercapnia and hypoxia. She was initially admitted to [facility] on 5/14 for psychosis and sent to the ED due to medical concern. She denies SI, HI or AVH. She is disorganized in thought process and continues to warrant inpatient psychiatric hospitalization for stabilization and treatment. Recommend increasing Trilafon for psychosis.   Treatment Plan Summary: -Patient warrants inpatient psychiatric hospitalization given psychosis.  -Continue bedside sitter.   -Continue Depakote 250 mg BID for mood stabilization and Ativan 1 mg q 4 hours PRN for agitation/anxiety.  -Increase Trilafon 4 mg TID to 4 mg BID and 8 mg qhs for psychosis. -EKG reviewed and QTc 458 on 5/17. Please closely monitor when starting or increasing QTc prolonging agents.  -Please pursue involuntary commitment if patient refuses voluntary psychiatric hospitalization or attempts to leave the hospital.  -Will sign off on patient at this time. Please consult psychiatry again as needed.    Disposition: Recommend psychiatric Inpatient admission when medically  cleared.  This service was provided via telemedicine using a 2-way, interactive audio and video technology.  Names of all persons participating in this telemedicine service and their role in this encounter. Name: Buford Dresser, DO Role: Psychiatrist   Name: Quintin Alto  Role: Patient     Faythe Dingwall, DO 09/16/2018 2:16 PM

## 2018-09-16 NOTE — Progress Notes (Signed)
Text page to doctor on call @2200  09-15-2018 due to concern of amount of Blood pressure medications due. Spoke with charge nurse and gave some medications and rechecked B/P 1.5 hours later then text paged MD on call @ 0008 and clarified 2 b/P medications not to be started tonight. Call to respiratory to request CPAP pt states she wants to wear hospital CPAP, allows respiratory to put it on and then soon as respiratory left room she removed CPAP mask and says she will not wear it and requests nasal cannula back. Educated and aware of risk related to not wearing CPAP. Sitter has remained at bedside throughout shift because pt was admitted as IVC. PT tearful at times, Ativan given. Pt at times says she wants her boyfriend to be emergency contact and at other times requests her husband be emergency contact. Sitter in room received call from someone stating she was patients best friend and requested that information not be give to patients boyfriend. Pt denies intent to hurt self.

## 2018-09-16 NOTE — Progress Notes (Addendum)
Per [facility], they are unable to accept patient back with chronic oxygen needs. MD will seek updated psych consult for new placement referral process.   CSW faxed referral to [facility] to see if patient can be accepted under geropsych.   Madison Locus Tanaja Ganger LCSW [phone]

## 2018-09-16 NOTE — Progress Notes (Signed)
PROGRESS NOTE    Madison Palmer  QQV:[id number] DOB: [birth year] DOA: 09/15/2018 PCP: Flossie Buffy, NP   Brief Narrative:  Per HPI: Madison Palmer is a 53 y.o. female with medical history significant of hyperparathyroidism, hypothyroidism, OSA, on CPAP, diastolic CHF last EF noted to be 6065% with grade 1 diastolic dysfunction in 33/2951, chronic lymphedema, and pulmonary nodules; who presents with hypoxia and was currently under IVC to behavioral health for psychosis with delusions since 5/14.  Per review of records notes previously sent home with 2 L of nasal cannula oxygen and supposed to be on CPAP at night.  However, since being at behavioral health she had not been receiving her CPAP and had not been on oxygen.  At behavioral health O2 saturations were noted to be 80% this morning on room air that she was sent to the emergency department for further evaluation.  Patient notes some lower leg swelling is slightly worse than normal.  Denies feeling short of breath at this time or having any other significant complaints at this time.  Patient has been admitted with acute on chronic combined respiratory failure related to the fact that she is mildly volume overloaded and has not been receiving her CPAP or [place] oxygen at [facility]. She was also noted to have hypertensive urgency. Psychiatry evaluation pending.   Assessment & Plan:   Active Problems:   Hypothyroidism   Morbid obesity ([place])   OSA on CPAP   Lymphatic edema   Chronic diastolic heart failure (HCC)   Bipolar 1 disorder (HCC)   Acute on chronic respiratory failure with hypoxia and hypercapnia (HCC)  Chronic respiratory failure with hypercapnia and hypoxia, OSA: Patient noted to be hypoxic into the 80s on room air.  It appears she is supposed to be on at least 2 L of nasal cannula oxygen 24/7 from hospitalization in 05/2018.  ABG revealed normal pH with PCO2 elevated 64.9. -Admit to a progressive bed -Continuous pulse oximetry with  2 L nasal cannula oxygen -Duonebs as needed -CPAP at night  Involuntary committed, bipolar disorder 1with psychosis: Acute.  Patient had just recently been admitted to behavioral health for psychosis on 5/14.  However, reportedly unable to be discharged to behavioral health oxygen or on CPAP at night.  Involuntary commitment order signed by ED physician. -Social work consulted for help -Discussed with psychiatry and pending evaluation today -Continue 1:1 sitter -Continue clonidine 0.2 mg p.o. twice daily -Continue Depakote 250 mg p.o. twice daily for mood stability -Continue perphenazine 4 mg p.o. 3 times daily for psychosis -Continue Ativan 1 mg p.o. every 4 hours  Hypertensive urgency: systolic blood pressures elevated greater than 200 on admission.  Suspect likely related with recent missed doses of home blood pressure medications. -Restart home medications of clonidine, labetalol, hydralazine, and lisinopril  Chronic diastolic congestive heart failure:  Last EF noted to be 60-65% with mild pulmonary hypertension on 03/2018. BNP 147.2 which is lower than previous. CXR concerning for mild pulmonary edema, but CT angiogram negative for PE or significant edema.  Possible mild exacerbation. -Strict intake and output -Daily weights -IV Lasix 40 mg x 1 dose in ED -Continue home 40 mg twice daily in a.m.  Lymphedema: Chronic. -Recommend follow-up in lymphedema clinic  Hypothyroidism and hypoparathyroidism: Last TSH noted to be 0.938 in 05/2018. -Recheck TSH 0.409 -Continue levothyroxine  Pulmonary nodules: CT angiogram did note pulmonary nodules.  Followed by pulmonology.  Questioned possibly to be related to sarcoidosis. -Continue outpatient follow-up with [facility]  Morbid obesity: BMI 60.48 kg/m.   DVT prophylaxis:Lovenox Code Status: Full Family Communication: None at bedside Disposition Plan: Psychiatry evaluation pending.   Consultants:   Psychiatry  Procedures:    None  Antimicrobials:   None   Subjective: Patient seen and evaluated today with no new acute complaints or concerns. No acute concerns or events noted overnight. Sitter at bedside denies any acute events or issues since admission.  Objective: Vitals:   09/16/18 0540 09/16/18 0542 09/16/18 0600 09/16/18 0859  BP: 130/77   (!) 148/91  Pulse: 96  80 80  Resp: 20  20 (!) 24  Temp: 97.8 F (36.6 C)  98.2 F (36.8 C) (!) 97.5 F (36.4 C)  TempSrc: Oral  Oral Oral  SpO2: 99%  99% 99%  Weight:  (!) 140 kg    Height:        Intake/Output Summary (Last 24 hours) at 09/16/2018 1458 Last data filed at 09/15/2018 2300 Gross per 24 hour  Intake 90 ml  Output 800 ml  Net -710 ml   Filed Weights   09/15/18 2036 09/16/18 0542  Weight: (!) 140.3 kg (!) 140 kg    Examination:  General exam: Appears calm and comfortable  Respiratory system: Clear to auscultation. Respiratory effort normal. [place] O2. Cardiovascular system: S1 & S2 heard, RRR. No JVD, murmurs, rubs, gallops or clicks. No pedal edema. Gastrointestinal system: Abdomen is nondistended, soft and nontender. No organomegaly or masses felt. Normal bowel sounds heard. Central nervous system: Alert and oriented. No focal neurological deficits. Extremities: Symmetric 5 x 5 power. Skin: No rashes, lesions or ulcers Psychiatry: Judgement and insight appear normal. Mood & affect appropriate.     Data Reviewed: I have personally reviewed following labs and imaging studies  CBC: Recent Labs  Lab 09/11/18 2224 09/15/18 0918 09/15/18 1551 09/16/18 0301  WBC 6.6 5.0  --  6.1  NEUTROABS 4.6 3.0  --   --   HGB 13.3 13.7 12.9 13.9  HCT 41.6 43.7 38.0 42.7  MCV 93.1 92.6  --  91.4  PLT 219 204  --  824   Basic Metabolic Panel: Recent Labs  Lab 09/11/18 2224 09/15/18 0918 09/15/18 1551 09/16/18 0301  NA 144 144 140 138  K 3.7 3.8 3.7 3.3*  CL 106 100  --  96*  CO2 28 33*  --  32  GLUCOSE 120* 101*  --  140*  BUN 27* 8   --  9  CREATININE 1.07* 1.12*  --  0.87  CALCIUM 10.1 10.2  --  9.6   GFR: Estimated Creatinine Clearance: 104.4 mL/min (by C-G formula based on SCr of 0.87 mg/dL). Liver Function Tests: Recent Labs  Lab 09/11/18 2224 09/15/18 0918  AST 34 26  ALT 82* 58*  ALKPHOS 79 73  BILITOT 0.7 1.2  PROT 6.8 5.9*  ALBUMIN 3.8 3.3*   No results for input(s): LIPASE, AMYLASE in the last 168 hours. No results for input(s): AMMONIA in the last 168 hours. Coagulation Profile: No results for input(s): INR, PROTIME in the last 168 hours. Cardiac Enzymes: Recent Labs  Lab 09/15/18 0918 09/15/18 1248  TROPONINI 0.03* 0.03*   BNP (last 3 results) Recent Labs    02/20/18 1536  PROBNP 173.0*   HbA1C: No results for input(s): HGBA1C in the last 72 hours. CBG: No results for input(s): GLUCAP in the last 168 hours. Lipid Profile: No results for input(s): CHOL, HDL, LDLCALC, TRIG, CHOLHDL, LDLDIRECT in the last 72 hours. Thyroid Function  Tests: Recent Labs    09/16/18 0301  TSH 0.409   Anemia Panel: No results for input(s): VITAMINB12, FOLATE, FERRITIN, TIBC, IRON, RETICCTPCT in the last 72 hours. Sepsis Labs: Recent Labs  Lab 09/15/18 0918  LATICACIDVEN 1.1    Recent Results (from the past 240 hour(s))  SARS Coronavirus 2 (CEPHEID - Performed in [facility] hospital lab), Hosp Order     Status: None   Collection Time: 09/12/18  1:39 PM  Result Value Ref Range Status   SARS Coronavirus 2 NEGATIVE NEGATIVE Final    Comment: (NOTE) If result is NEGATIVE SARS-CoV-2 target nucleic acids are NOT DETECTED. The SARS-CoV-2 RNA is generally detectable in upper and lower  respiratory specimens during the acute phase of infection. The lowest  concentration of SARS-CoV-2 viral copies this assay can detect is 250  copies / mL. A negative result does not preclude SARS-CoV-2 infection  and should not be used as the sole basis for treatment or other  patient management decisions.  A negative  result may occur with  improper specimen collection / handling, submission of specimen other  than nasopharyngeal swab, presence of viral mutation(s) within the  areas targeted by this assay, and inadequate number of viral copies  (<250 copies / mL). A negative result must be combined with clinical  observations, patient history, and epidemiological information. If result is POSITIVE SARS-CoV-2 target nucleic acids are DETECTED. The SARS-CoV-2 RNA is generally detectable in upper and lower  respiratory specimens dur ing the acute phase of infection.  Positive  results are indicative of active infection with SARS-CoV-2.  Clinical  correlation with patient history and other diagnostic information is  necessary to determine patient infection status.  Positive results do  not rule out bacterial infection or co-infection with other viruses. If result is PRESUMPTIVE POSTIVE SARS-CoV-2 nucleic acids MAY BE PRESENT.   A presumptive positive result was obtained on the submitted specimen  and confirmed on repeat testing.  While 2019 novel coronavirus  (SARS-CoV-2) nucleic acids may be present in the submitted sample  additional confirmatory testing may be necessary for epidemiological  and / or clinical management purposes  to differentiate between  SARS-CoV-2 and other Sarbecovirus currently known to infect humans.  If clinically indicated additional testing with an alternate test  methodology [id number]) is advised. The SARS-CoV-2 RNA is generally  detectable in upper and lower respiratory sp ecimens during the acute  phase of infection. The expected result is Negative. Fact Sheet for Patients:  StrictlyIdeas.no Fact Sheet for Healthcare Providers: BankingDealers.co.za This test is not yet approved or cleared by the [place] FDA and has been authorized for detection and/or diagnosis of SARS-CoV-2 by FDA under an Emergency Use Authorization  (EUA).  This EUA will remain in effect (meaning this test can be used) for the duration of the COVID-19 declaration under Section 564(b)(1) of the Act, 21 U.S.C. section 360bbb-3(b)(1), unless the authorization is terminated or revoked sooner. Performed at [facility], [place] [address], [place], [place] [address]   SARS Coronavirus 2 (CEPHEID - Performed in [place] hospital lab), Hosp Order     Status: None   Collection Time: 09/15/18  9:18 AM  Result Value Ref Range Status   SARS Coronavirus 2 NEGATIVE NEGATIVE Final    Comment: (NOTE) If result is NEGATIVE SARS-CoV-2 target nucleic acids are NOT DETECTED. The SARS-CoV-2 RNA is generally detectable in upper and lower  respiratory specimens during the acute phase of infection. The lowest  concentration  of SARS-CoV-2 viral copies this assay can detect is 250  copies / mL. A negative result does not preclude SARS-CoV-2 infection  and should not be used as the sole basis for treatment or other  patient management decisions.  A negative result may occur with  improper specimen collection / handling, submission of specimen other  than nasopharyngeal swab, presence of viral mutation(s) within the  areas targeted by this assay, and inadequate number of viral copies  (<250 copies / mL). A negative result must be combined with clinical  observations, patient history, and epidemiological information. If result is POSITIVE SARS-CoV-2 target nucleic acids are DETECTED. The SARS-CoV-2 RNA is generally detectable in upper and lower  respiratory specimens dur ing the acute phase of infection.  Positive  results are indicative of active infection with SARS-CoV-2.  Clinical  correlation with patient history and other diagnostic information is  necessary to determine patient infection status.  Positive results do  not rule out bacterial infection or co-infection with other viruses. If result is PRESUMPTIVE POSTIVE SARS-CoV-2  nucleic acids MAY BE PRESENT.   A presumptive positive result was obtained on the submitted specimen  and confirmed on repeat testing.  While 2019 novel coronavirus  (SARS-CoV-2) nucleic acids may be present in the submitted sample  additional confirmatory testing may be necessary for epidemiological  and / or clinical management purposes  to differentiate between  SARS-CoV-2 and other Sarbecovirus currently known to infect humans.  If clinically indicated additional testing with an alternate test  methodology [phone]) is advised. The SARS-CoV-2 RNA is generally  detectable in upper and lower respiratory sp ecimens during the acute  phase of infection. The expected result is Negative. Fact Sheet for Patients:  StrictlyIdeas.no Fact Sheet for Healthcare Providers: BankingDealers.co.za This test is not yet approved or cleared by the [place] FDA and has been authorized for detection and/or diagnosis of SARS-CoV-2 by FDA under an Emergency Use Authorization (EUA).  This EUA will remain in effect (meaning this test can be used) for the duration of the COVID-19 declaration under Section 564(b)(1) of the Act, 21 U.S.C. section 360bbb-3(b)(1), unless the authorization is terminated or revoked sooner. Performed at [facility], [place] [address], [place], [place] [address]          Radiology Studies: Ct Angio Chest Pe W And/or Wo Contrast  Result Date: 09/15/2018 CLINICAL DATA:  PE suspected, intermediate probability, positive D-dimer. EXAM: CT ANGIOGRAPHY CHEST WITH CONTRAST TECHNIQUE: Multidetector CT imaging of the chest was performed using the standard protocol during bolus administration of intravenous contrast. Multiplanar CT image reconstructions and MIPs were obtained to evaluate the vascular anatomy. CONTRAST:  132mL OMNIPAQUE IOHEXOL 350 MG/ML SOLN COMPARISON:  One-view chest x-ray 09/15/2018 FINDINGS: Cardiovascular: The  heart is enlarged. Atherosclerotic calcifications are present in the aortic arch. Arch and great vessel origins are otherwise normal. There is no aneurysm or focal stenosis. Pulmonary artery opacification is satisfactory. No focal filling defects are present to suggest pulmonary embolus. Pulmonary artery size is within normal limits. Coronary artery calcifications are present. Mediastinum/Nodes: No significant mediastinal, hilar, or axillary adenopathy is present. 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 Esophagus is unremarkable. Lungs/Pleura: Innumerable bilateral pulmonary nodules are again seen. Nodules are similar in size to the prior exam. In the absence treatment, this makes neoplasm less likely. This may be related to chronic inflammation. No significant pleural effusion is present. Upper Abdomen: There is diffuse fatty infiltration liver. No discrete lesions are present. Limited imaging of the  abdomen is otherwise unremarkable. Musculoskeletal: Degenerative changes of thoracic spine are stable. No focal lytic or blastic lesions are present. Ribs are unremarkable. Review of the MIP images confirms the above findings. IMPRESSION: 1. No pulmonary embolus. 2. No acute abnormality. 3. Stable appearance of multiple bilateral pulmonary nodules. The absence of interval treatment, neoplasm is less likely. Electronically Signed   By: San Morelle M.D.   On: 09/15/2018 11:56   Dg Chest Portable 1 View  Result Date: 09/15/2018 CLINICAL DATA:  Hypoxia. EXAM: PORTABLE CHEST 1 VIEW COMPARISON:  One-view chest x-ray 08/20/2018 FINDINGS: The heart is enlarged. This is exaggerated by low lung volumes. Mild interstitial edema is present. No definite effusions are present. The visualized soft tissues and bony thorax are unremarkable. IMPRESSION: 1. Cardiomegaly and edema suggesting congestive heart failure. Electronically Signed   By: San Morelle M.D.   On: 09/15/2018 09:19        Scheduled Meds: . [START  ON 09/17/2018] amLODipine  10 mg Oral Daily  . cloNIDine  0.2 mg Oral BID  . divalproex  250 mg Oral BID  . enoxaparin (LOVENOX) injection  0.5 mg/kg Subcutaneous Q24H  . fluticasone  2 spray Each Nare Daily  . furosemide  40 mg Oral BID  . hydrALAZINE  100 mg Oral TID  . [START ON 09/17/2018] labetalol  600 mg Oral BID  . levothyroxine  75 mcg Oral Once per day on Mon Tue Wed Thu Fri  . lisinopril  40 mg Oral Daily  . loratadine  10 mg Oral Daily  . perphenazine  4 mg Oral TID  . potassium chloride  40 mEq Oral BID  . sodium chloride flush  3 mL Intravenous Q12H  . sodium chloride flush  3 mL Intravenous Q12H   Continuous Infusions:   LOS: 0 days    Time spent: 30 minutes    Tavien Chestnut Darleen Crocker, DO [facility] Pager [phone]  If 7PM-7AM, please contact night-coverage [email or web address] Password [facility] 09/16/2018, 2:58 PM

## 2018-09-17 LAB — BASIC METABOLIC PANEL
Anion gap: 9 (ref 5–15)
BUN: 10 mg/dL (ref 6–20)
CO2: 36 mmol/L — ABNORMAL HIGH (ref 22–32)
Calcium: 10 mg/dL (ref 8.9–10.3)
Chloride: 95 mmol/L — ABNORMAL LOW (ref 98–111)
Creatinine, Ser: 0.94 mg/dL (ref 0.44–1.00)
GFR calc Af Amer: >60 mL/min (ref >60)
GFR calc non Af Amer: >60 mL/min (ref >60)
Glucose, Bld: 122 mg/dL — ABNORMAL HIGH (ref 70–99)
Potassium: 4 mmol/L (ref 3.5–5.1)
Sodium: 140 mmol/L (ref 135–145)

## 2018-09-17 LAB — MRSA PCR SCREENING: MRSA by PCR: NEGATIVE

## 2018-09-17 LAB — MAGNESIUM: Magnesium: 2 mg/dL (ref 1.7–2.4)

## 2018-09-17 MED ORDER — ORAL CARE MOUTH RINSE
15.0000 mL | Freq: Two times a day (BID) | OROMUCOSAL | Status: DC
  Administered 2018-09-17 – 2018-09-25 (×11): 15 mL via OROMUCOSAL

## 2018-09-17 NOTE — Progress Notes (Signed)
PROGRESS NOTE    Madison Palmer  YFV:[id number] DOB: [birth year] DOA: 09/15/2018 PCP: Madison Buffy, NP   Brief Narrative:  Per HPI: Madison Palmer a 52 y.o.femalewith medical history significant ofhyperparathyroidism, hypothyroidism, OSA, on CPAP, diastolic CHF last EF noted to be 6065% with grade 1 diastolic dysfunction in 16/3846, chronic lymphedema, and pulmonary nodules; who presents with hypoxia and was currently under IVC to behavioral health for psychosis with delusions since5/14. Per review of records notes previously sent home with 2 L of nasal cannula oxygen and supposed to be on CPAP at night. However, since being at behavioral health she had not been receiving her CPAP and had not been on oxygen. At behavioral health O2 saturations were noted to be 80% this morning on room air that she was sent to the emergency department for further evaluation. Patient notes some lower leg swelling is slightly worse than normal. Denies feeling short of breath at this time or having any other significant complaints at this time.  Patient has been admitted with acute on chronic combined respiratory failure related to the fact that she is mildly volume overloaded and has not been receiving her CPAP or [place] oxygen at [facility]. She was also noted to have hypertensive urgency. Psychiatry evaluation performed on 5/18 with noted need to go back to psychiatric facility on discharge.   Assessment & Plan:   Principal Problem:   Bipolar 1 disorder ([place]) Active Problems:   Hypothyroidism   Morbid obesity ([place])   OSA on CPAP   Lymphatic edema   Chronic diastolic heart failure (HCC)   Acute on chronic respiratory failure with hypoxia and hypercapnia (HCC)   Chronic respiratory failure with hypercapnia and hypoxia, [id number] noted to be hypoxic into the 80s on room air. It appears she is supposed to be on at least 2 L of nasal cannula oxygen 24/7 fromhospitalization in 05/2018.ABG revealed  normalpH with PCO2 elevated 64.9. -Admit to a progressive bed -Continuous pulse oximetry with 1 L nasal cannula oxygen-continue to wean to room air if possible during ongoing diuresis -Duonebs as needed -CPAP at night  Involuntary committed, bipolar disorder1withpsychosis:Acute. Patient had just recently been admitted to behavioral health for psychosis on5/14. However, reportedly unable to be discharged to behavioral health oxygen or on CPAP at night. Involuntary commitment order signed by ED physician. -Social work consulted for help -Discussed with psychiatry who has consulted on 5/18 -Continue 1:1 sitter -Continue clonidine 0.2 mg p.o. twice daily -Continue Depakote 250 mg p.o. twice daily for mood stability -Continue perphenazine 4 mg p.o.bid times daily for psychosis and added 8mg  qhs per Psychiatry on 5/18 -Continue Ativan 1 mg p.o. every 4 hours  Hypertensive urgency: systolic blood pressures elevated greater than 200 on admission. Suspect likely related with recent missed doses of home blood pressure medications. -Restart home medications of clonidine, labetalol, hydralazine, and lisinopril  Chronic diastolic congestive heart failure:  Last EF noted to be 60-65% with mild pulmonary hypertension on 03/2018. BNP 147.2 which is lower thanprevious. CXR concerning for mild pulmonary edema, but CT angiogram negative for PE or significant edema.Possible mild exacerbation. -Strict intake and output with FB -1435 -Daily weights weight 303 pounds from 309 pounds on admission -IV Lasix 40 mg x 1 dose in ED given on 5/17 -Continue home 40 mg twice daily in a.m.  Lymphedema: Chronic. -Recommend follow-up in lymphedema clinic  Hypothyroidism and hypoparathyroidism: Last TSH noted to be 0.938in1/2020. -Recheck TSH 0.409 -Continue levothyroxine  Pulmonarynodules: CT angiogram did note pulmonary  nodules. Followed by pulmonology. Questionedpossibly tobe related  tosarcoidosis. -Continue outpatient [facility]-up [facility]  Morbid obesity: BMI 60.48 kg/m.   DVT prophylaxis:Lovenox Code Status: Full Family Communication: None at bedside Disposition Plan:  Plan to discharge to psychiatric facility back and accept patients on nasal cannula and/or CPAP.  May need [facility] or [facility]-CSW aware.   Consultants:   Psychiatry  Procedures:   None  Antimicrobials:   None  Subjective: Patient seen and evaluated today with no new acute complaints or concerns. No acute concerns or events noted overnight.  She appears to be diuresing well and is currently on 1 L nasal cannula.  Objective: Vitals:   09/16/18 2356 09/17/18 0220 09/17/18 0404 09/17/18 0550  BP:  123/63    Pulse: 98 91 93   Resp: 14 (!) 21 19   Temp:      TempSrc:      SpO2: 97% 90% 97%   Weight:    (!) 137.7 kg  Height:        Intake/Output Summary (Last 24 hours) at 09/17/2018 0845 Last data filed at 09/17/2018 0549 Gross per 24 hour  Intake 240 ml  Output 1675 ml  Net -1435 ml   Filed Weights   09/15/18 2036 09/16/18 0542 09/17/18 0550  Weight: (!) 140.3 kg (!) 140 kg (!) 137.7 kg    Examination:  General exam: Appears calm and comfortable  Respiratory system: Clear to auscultation. Respiratory effort normal. On 1L [place]. Cardiovascular system: S1 & S2 heard, RRR. No JVD, murmurs, rubs, gallops or clicks. Pedal lymphedema. Gastrointestinal system: Abdomen is nondistended, soft and nontender. No organomegaly or masses felt. Normal bowel sounds heard. Central nervous system: Alert and oriented. No focal neurological deficits. Extremities: Symmetric 5 x 5 power. Skin: No rashes, lesions or ulcers Psychiatry: Difficult to assess; per Psych.    Data Reviewed: I have personally reviewed following labs and imaging studies  CBC: Recent Labs  Lab 09/11/18 2224 09/15/18 0918 09/15/18 1551 09/16/18 0301  WBC 6.6 5.0  --  6.1  NEUTROABS 4.6 3.0  --   --   HGB 13.3 13.7  12.9 13.9  HCT 41.6 43.7 38.0 42.7  MCV 93.1 92.6  --  91.4  PLT 219 204  --  440   Basic Metabolic Panel: Recent Labs  Lab 09/11/18 2224 09/15/18 0918 09/15/18 1551 09/16/18 0301 09/17/18 0241  NA 144 144 140 138 140  K 3.7 3.8 3.7 3.3* 4.0  CL 106 100  --  96* 95*  CO2 28 33*  --  32 36*  GLUCOSE 120* 101*  --  140* 122*  BUN 27* 8  --  9 10  CREATININE 1.07* 1.12*  --  0.87 0.94  CALCIUM 10.1 10.2  --  9.6 10.0  MG  --   --   --   --  2.0   GFR: Estimated Creatinine Clearance: 95.6 mL/min (by C-G formula based on SCr of 0.94 mg/dL). Liver Function Tests: Recent Labs  Lab 09/11/18 2224 09/15/18 0918  AST 34 26  ALT 82* 58*  ALKPHOS 79 73  BILITOT 0.7 1.2  PROT 6.8 5.9*  ALBUMIN 3.8 3.3*   No results for input(s): LIPASE, AMYLASE in the last 168 hours. No results for input(s): AMMONIA in the last 168 hours. Coagulation Profile: No results for input(s): INR, PROTIME in the last 168 hours. Cardiac Enzymes: Recent Labs  Lab 09/15/18 0918 09/15/18 1248  TROPONINI 0.03* 0.03*   BNP (last 3 results) Recent Labs  02/20/18 1536  PROBNP 173.0*   HbA1C: No results for input(s): HGBA1C in the last 72 hours. CBG: No results for input(s): GLUCAP in the last 168 hours. Lipid Profile: No results for input(s): CHOL, HDL, LDLCALC, TRIG, CHOLHDL, LDLDIRECT in the last 72 hours. Thyroid Function Tests: Recent Labs    09/16/18 0301  TSH 0.409   Anemia Panel: No results for input(s): VITAMINB12, FOLATE, FERRITIN, TIBC, IRON, RETICCTPCT in the last 72 hours. Sepsis Labs: Recent Labs  Lab 09/15/18 0918  LATICACIDVEN 1.1    Recent Results (from the past 240 hour(s))  SARS Coronavirus 2 (CEPHEID - Performed in [facility] hospital lab), Hosp Order     Status: None   Collection Time: 09/12/18  1:39 PM  Result Value Ref Range Status   SARS Coronavirus 2 NEGATIVE NEGATIVE Final    Comment: (NOTE) If result is NEGATIVE SARS-CoV-2 target nucleic acids are NOT  DETECTED. The SARS-CoV-2 RNA is generally detectable in upper and lower  respiratory specimens during the acute phase of infection. The lowest  concentration of SARS-CoV-2 viral copies this assay can detect is 250  copies / mL. A negative result does not preclude SARS-CoV-2 infection  and should not be used as the sole basis for treatment or other  patient management decisions.  A negative result may occur with  improper specimen collection / handling, submission of specimen other  than nasopharyngeal swab, presence of viral mutation(s) within the  areas targeted by this assay, and inadequate number of viral copies  (<250 copies / mL). A negative result must be combined with clinical  observations, patient history, and epidemiological information. If result is POSITIVE SARS-CoV-2 target nucleic acids are DETECTED. The SARS-CoV-2 RNA is generally detectable in upper and lower  respiratory specimens dur ing the acute phase of infection.  Positive  results are indicative of active infection with SARS-CoV-2.  Clinical  correlation with patient history and other diagnostic information is  necessary to determine patient infection status.  Positive results do  not rule out bacterial infection or co-infection with other viruses. If result is PRESUMPTIVE POSTIVE SARS-CoV-2 nucleic acids MAY BE PRESENT.   A presumptive positive result was obtained on the submitted specimen  and confirmed on repeat testing.  While 2019 novel coronavirus  (SARS-CoV-2) nucleic acids may be present in the submitted sample  additional confirmatory testing may be necessary for epidemiological  and / or clinical management purposes  to differentiate between  SARS-CoV-2 and other Sarbecovirus currently known to infect humans.  If clinically indicated additional testing with an alternate test  methodology [phone]) is advised. The SARS-CoV-2 RNA is generally  detectable in upper and lower respiratory sp ecimens during  the acute  phase of infection. The expected result is Negative. Fact Sheet for Patients:  StrictlyIdeas.no Fact Sheet for Healthcare Providers: BankingDealers.co.za This test is not yet approved or cleared by the [place] FDA and has been authorized for detection and/or diagnosis of SARS-CoV-2 by FDA under an Emergency Use Authorization (EUA).  This EUA will remain in effect (meaning this test can be used) for the duration of the COVID-19 declaration under Section 564(b)(1) of the Act, 21 U.S.C. section 360bbb-3(b)(1), unless the authorization is terminated or revoked sooner. Performed at [facility], [place] [address] [place],  [address]   SARS Coronavirus 2 (CEPHEID - Performed in [facility] hospital lab), Hosp Order     Status: None   Collection Time: 09/15/18  9:18 AM  Result Value Ref Range  Status   SARS Coronavirus 2 NEGATIVE NEGATIVE Final    Comment: (NOTE) If result is NEGATIVE SARS-CoV-2 target nucleic acids are NOT DETECTED. The SARS-CoV-2 RNA is generally detectable in upper and lower  respiratory specimens during the acute phase of infection. The lowest  concentration of SARS-CoV-2 viral copies this assay can detect is 250  copies / mL. A negative result does not preclude SARS-CoV-2 infection  and should not be used as the sole basis for treatment or other  patient management decisions.  A negative result may occur with  improper specimen collection / handling, submission of specimen other  than nasopharyngeal swab, presence of viral mutation(s) within the  areas targeted by this assay, and inadequate number of viral copies  (<250 copies / mL). A negative result must be combined with clinical  observations, patient history, and epidemiological information. If result is POSITIVE SARS-CoV-2 target nucleic acids are DETECTED. The SARS-CoV-2 RNA is generally detectable in upper and lower    respiratory specimens dur ing the acute phase of infection.  Positive  results are indicative of active infection with SARS-CoV-2.  Clinical  correlation with patient history and other diagnostic information is  necessary to determine patient infection status.  Positive results do  not rule out bacterial infection or co-infection with other viruses. If result is PRESUMPTIVE POSTIVE SARS-CoV-2 nucleic acids MAY BE PRESENT.   A presumptive positive result was obtained on the submitted specimen  and confirmed on repeat testing.  While 2019 novel coronavirus  (SARS-CoV-2) nucleic acids may be present in the submitted sample  additional confirmatory testing may be necessary for epidemiological  and / or clinical management purposes  to differentiate between  SARS-CoV-2 and other Sarbecovirus currently known to infect humans.  If clinically indicated additional testing with an alternate test  methodology [phone]) is advised. The SARS-CoV-2 RNA is generally  detectable in upper and lower respiratory sp ecimens during the acute  phase of infection. The expected result is Negative. Fact Sheet for Patients:  StrictlyIdeas.no Fact Sheet for Healthcare Providers: BankingDealers.co.za This test is not yet approved or cleared by the [place] FDA and has been authorized for detection and/or diagnosis of SARS-CoV-2 by FDA under an Emergency Use Authorization (EUA).  This EUA will remain in effect (meaning this test can be used) for the duration of the COVID-19 declaration under Section 564(b)(1) of the Act, 21 U.S.C. section 360bbb-3(b)(1), unless the authorization is terminated or revoked sooner. Performed at [facility], [place] [address] [place], [place] [address]   MRSA PCR Screening     Status: None   Collection Time: 09/17/18  4:44 AM  Result Value Ref Range Status   MRSA by PCR NEGATIVE NEGATIVE Final    Comment:        The  GeneXpert MRSA Assay (FDA approved for NASAL specimens only), is one component of a comprehensive MRSA colonization surveillance program. It is not intended to diagnose MRSA infection nor to guide or monitor treatment for MRSA infections. Performed at [facility], [place] [address], [place], [place] [address]          Radiology Studies: Ct Angio Chest Pe W And/or Wo Contrast  Result Date: 09/15/2018 CLINICAL DATA:  PE suspected, intermediate probability, positive D-dimer. EXAM: CT ANGIOGRAPHY CHEST WITH CONTRAST TECHNIQUE: Multidetector CT imaging of the chest was performed using the standard protocol during bolus administration of intravenous contrast. Multiplanar CT image reconstructions and MIPs were obtained to evaluate the vascular anatomy. CONTRAST:  146mL OMNIPAQUE IOHEXOL 350 MG/ML SOLN COMPARISON:  One-view chest x-ray 09/15/2018 FINDINGS: Cardiovascular: The heart is enlarged. Atherosclerotic calcifications are present in the aortic arch. Arch and great vessel origins are otherwise normal. There is no aneurysm or focal stenosis. Pulmonary artery opacification is satisfactory. No focal filling defects are present to suggest pulmonary embolus. Pulmonary artery size is within normal limits. Coronary artery calcifications are present. Mediastinum/Nodes: No significant mediastinal, hilar, or axillary adenopathy is present. 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 Esophagus is unremarkable. Lungs/Pleura: Innumerable bilateral pulmonary nodules are again seen. Nodules are similar in size to the prior exam. In the absence treatment, this makes neoplasm less likely. This may be related to chronic inflammation. No significant pleural effusion is present. Upper Abdomen: There is diffuse fatty infiltration liver. No discrete lesions are present. Limited imaging of the abdomen is otherwise unremarkable. Musculoskeletal: Degenerative changes of thoracic spine are stable. No focal lytic or blastic  lesions are present. Ribs are unremarkable. Review of the MIP images confirms the above findings. IMPRESSION: 1. No pulmonary embolus. 2. No acute abnormality. 3. Stable appearance of multiple bilateral pulmonary nodules. The absence of interval treatment, neoplasm is less likely. Electronically Signed   By: San Morelle M.D.   On: 09/15/2018 11:56   Dg Chest Portable 1 View  Result Date: 09/15/2018 CLINICAL DATA:  Hypoxia. EXAM: PORTABLE CHEST 1 VIEW COMPARISON:  One-view chest x-ray 08/20/2018 FINDINGS: The heart is enlarged. This is exaggerated by low lung volumes. Mild interstitial edema is present. No definite effusions are present. The visualized soft tissues and bony thorax are unremarkable. IMPRESSION: 1. Cardiomegaly and edema suggesting congestive heart failure. Electronically Signed   By: San Morelle M.D.   On: 09/15/2018 09:19        Scheduled Meds:  amLODipine  10 mg Oral Daily   cloNIDine  0.2 mg Oral BID   divalproex  250 mg Oral BID   enoxaparin (LOVENOX) injection  0.5 mg/kg Subcutaneous Q24H   fluticasone  2 spray Each Nare Daily   furosemide  40 mg Oral BID   hydrALAZINE  100 mg Oral TID   labetalol  600 mg Oral BID   levothyroxine  75 mcg Oral Once per day on Mon Tue Wed Thu Fri   lisinopril  40 mg Oral Daily   loratadine  10 mg Oral Daily   mouth rinse  15 mL Mouth Rinse BID   perphenazine  4 mg Oral BID   perphenazine  8 mg Oral QHS   potassium chloride  40 mEq Oral BID   sodium chloride flush  3 mL Intravenous Q12H   sodium chloride flush  3 mL Intravenous Q12H   Continuous Infusions:   LOS: 0 days    Time spent: 30 minutes    Veer Elamin Darleen Crocker, DO [facility] Pager [phone]  If 7PM-7AM, please contact night-coverage [email or web address] Password [email or web address] 09/17/2018, 8:45 AM

## 2018-09-18 DIAGNOSIS — J9611 Chronic respiratory failure with hypoxia: Secondary | ICD-10-CM

## 2018-09-18 DIAGNOSIS — J9612 Chronic respiratory failure with hypercapnia: Secondary | ICD-10-CM

## 2018-09-18 DIAGNOSIS — I5033 Acute on chronic diastolic (congestive) heart failure: Secondary | ICD-10-CM

## 2018-09-18 LAB — BASIC METABOLIC PANEL
Anion gap: 8 (ref 5–15)
BUN: 14 mg/dL (ref 6–20)
CO2: 34 mmol/L — ABNORMAL HIGH (ref 22–32)
Calcium: 10 mg/dL (ref 8.9–10.3)
Chloride: 98 mmol/L (ref 98–111)
Creatinine, Ser: 0.86 mg/dL (ref 0.44–1.00)
GFR calc Af Amer: >60 mL/min (ref >60)
GFR calc non Af Amer: >60 mL/min (ref >60)
Glucose, Bld: 130 mg/dL — ABNORMAL HIGH (ref 70–99)
Potassium: 4.1 mmol/L (ref 3.5–5.1)
Sodium: 140 mmol/L (ref 135–145)

## 2018-09-18 NOTE — Progress Notes (Signed)
Patient ID: Madison Palmer, female   DOB: 1965/11/03, 53 y.o.   MRN: [id number]  PROGRESS NOTE    Madison Palmer  MOQ:[id number] DOB: 29-Apr-1966 DOA: 09/15/2018 PCP: Flossie Buffy, NP   Brief Narrative:  6-year-old female with history of hyperparathyroidism, hypothyroidism, OSA on CPAP, diastolic CHF last EF noted to be 60 to 65% with grade 1 diastolic dysfunction in 35/4656, chronic lymphedema and pulmonary nodules presented with hypoxia from behavioral health where she was and from 09/12/2018 for psychosis with delusions. Per review of records, patient was previously sent home with 2 L nasal cannula oxygen and was supposed to be on CPAP at night.  However, at behavioral health, she was not getting her CPAP or her oxygen.  She was found to have oxygen saturations in the 80s and hence she was sent to the ED.  She was admitted for the same.  She was mildly volume overloaded and was found to have hypertensive urgency.  Psychiatry was consulted on 09/16/2018 and recommended psychiatric hospitalization on discharge.  Assessment & Plan:   Principal Problem:   Bipolar 1 disorder ([place]) Active Problems:   Hypothyroidism   Morbid obesity ([place])   OSA on CPAP   Lymphatic edema   Chronic diastolic heart failure ([facility])   Acute on chronic respiratory failure with hypoxia and hypercapnia ([facility])  Chronic respiratory failure with hypercapnia and hypoxia Obstructive sleep apnea -Patient's noted to be hypoxic into the 80s on room air on admission.  It appears that she is supposed to be on at least 2 L of nasal cannula oxygen 24/7 from hospitalization in 05/2018. - She is also supposed to be on CPAP at night -Continue oxygen supplementation.  Currently on room air -Continue CPAP at night.  Bipolar disorder with psychosis -Psychiatry evaluation appreciated.  Continue one-to-one sitter -Continue Depakote, Ativan and perphenazine as per psychiatry. -Patient will need inpatient psychiatric hospitalization.   She is medically stable for discharge.  Social worker following  Hypertensive urgency -Blood pressure better. -Continue monitoring.  Continue clonidine, labetalol, hydralazine and lisinopril and Lasix  Mild acute on chronic chronic diastolic heart failure -Last EF was noted to be 60 to 65% with mild pulmonary hypertension on 03/2018 -Chest x-ray with concerning for mild pulmonary edema.  CT angiogram was negative for PE or significant edema -Strict input and output.  Daily weights.  Fluid restriction -Continue Lasix 40 mg twice a day.  Outpatient follow-up with cardiology.  Continue hydralazine and lisinopril  Chronic lymphedema -Follow-up with lymphedema clinic  Hypothyroidism -Continue levothyroxine  Pulmonary nodules -CT angiogram did note pulmonary nodules.  Followed by pulmonology.  Outpatient follow-up with Dr. Eartha Inch  Morbid obesity -Outpatient follow-up   DVT prophylaxis: Lovenox Code Status: Full Family Communication: None at bedside  disposition Plan: Psychiatric facility once bed available.    Consultants: Psychiatry  Procedures: None  Antimicrobials: None   Subjective: Patient seen and examined at bedside.  She is a poor historian.  Denies any overnight fever or vomiting.  No worsening abdominal pain.  Objective: Vitals:   09/18/18 0400 09/18/18 0404 09/18/18 0434 09/18/18 0833  BP:  121/64  (!) 152/86  Pulse:  66  78  Resp:  19  20  Temp: 97.9 F (36.6 C)     TempSrc: Axillary     SpO2:  94%  96%  Weight:   (!) 138.6 kg   Height:        Intake/Output Summary (Last 24 hours) at 09/18/2018 1014 Last data filed at  09/18/2018 0916 Gross per 24 hour  Intake 963 ml  Output 975 ml  Net -12 ml   Filed Weights   09/16/18 0542 09/17/18 0550 09/18/18 0434  Weight: (!) 140 kg (!) 137.7 kg (!) 138.6 kg    Examination:  General exam: Appears calm and comfortable.  Looks older than stated age.  Morbidly obese. Respiratory system: Bilateral decreased  breath sounds at bases with some scattered crackles Cardiovascular system: S1 & S2 heard, Rate controlled Gastrointestinal system: Abdomen is nondistended, soft and nontender. Normal bowel sounds heard. Extremities: No cyanosis, clubbing; trace lower extremity edema.     Data Reviewed: I have personally reviewed following labs and imaging studies  CBC: Recent Labs  Lab 09/11/18 2224 09/15/18 0918 09/15/18 1551 09/16/18 0301  WBC 6.6 5.0  --  6.1  NEUTROABS 4.6 3.0  --   --   HGB 13.3 13.7 12.9 13.9  HCT 41.6 43.7 38.0 42.7  MCV 93.1 92.6  --  91.4  PLT 219 204  --  283   Basic Metabolic Panel: Recent Labs  Lab 09/11/18 2224 09/15/18 0918 09/15/18 1551 09/16/18 0301 09/17/18 0241 09/18/18 0342  NA 144 144 140 138 140 140  K 3.7 3.8 3.7 3.3* 4.0 4.1  CL 106 100  --  96* 95* 98  CO2 28 33*  --  32 36* 34*  GLUCOSE 120* 101*  --  140* 122* 130*  BUN 27* 8  --  9 10 14   CREATININE 1.07* 1.12*  --  0.87 0.94 0.86  CALCIUM 10.1 10.2  --  9.6 10.0 10.0  MG  --   --   --   --  2.0  --    GFR: Estimated Creatinine Clearance: 105 mL/min (by C-G formula based on SCr of 0.86 mg/dL). Liver Function Tests: Recent Labs  Lab 09/11/18 2224 09/15/18 0918  AST 34 26  ALT 82* 58*  ALKPHOS 79 73  BILITOT 0.7 1.2  PROT 6.8 5.9*  ALBUMIN 3.8 3.3*   No results for input(s): LIPASE, AMYLASE in the last 168 hours. No results for input(s): AMMONIA in the last 168 hours. Coagulation Profile: No results for input(s): INR, PROTIME in the last 168 hours. Cardiac Enzymes: Recent Labs  Lab 09/15/18 0918 09/15/18 1248  TROPONINI 0.03* 0.03*   BNP (last 3 results) Recent Labs    02/20/18 1536  PROBNP 173.0*   HbA1C: No results for input(s): HGBA1C in the last 72 hours. CBG: No results for input(s): GLUCAP in the last 168 hours. Lipid Profile: No results for input(s): CHOL, HDL, LDLCALC, TRIG, CHOLHDL, LDLDIRECT in the last 72 hours. Thyroid Function Tests: Recent Labs     09/16/18 0301  TSH 0.409   Anemia Panel: No results for input(s): VITAMINB12, FOLATE, FERRITIN, TIBC, IRON, RETICCTPCT in the last 72 hours. Sepsis Labs: Recent Labs  Lab 09/15/18 0918  LATICACIDVEN 1.1    Recent Results (from the past 240 hour(s))  SARS Coronavirus 2 (CEPHEID - Performed in [facility] hospital lab), Hosp Order     Status: None   Collection Time: 09/12/18  1:39 PM  Result Value Ref Range Status   SARS Coronavirus 2 NEGATIVE NEGATIVE Final    Comment: (NOTE) If result is NEGATIVE SARS-CoV-2 target nucleic acids are NOT DETECTED. The SARS-CoV-2 RNA is generally detectable in upper and lower  respiratory specimens during the acute phase of infection. The lowest  concentration of SARS-CoV-2 viral copies this assay can detect is 250  copies / mL.  A negative result does not preclude SARS-CoV-2 infection  and should not be used as the sole basis for treatment or other  patient management decisions.  A negative result may occur with  improper specimen collection / handling, submission of specimen other  than nasopharyngeal swab, presence of viral mutation(s) within the  areas targeted by this assay, and inadequate number of viral copies  (<250 copies / mL). A negative result must be combined with clinical  observations, patient history, and epidemiological information. If result is POSITIVE SARS-CoV-2 target nucleic acids are DETECTED. The SARS-CoV-2 RNA is generally detectable in upper and lower  respiratory specimens dur ing the acute phase of infection.  Positive  results are indicative of active infection with SARS-CoV-2.  Clinical  correlation with patient history and other diagnostic information is  necessary to determine patient infection status.  Positive results do  not rule out bacterial infection or co-infection with other viruses. If result is PRESUMPTIVE POSTIVE SARS-CoV-2 nucleic acids MAY BE PRESENT.   A presumptive positive result was obtained on  the submitted specimen  and confirmed on repeat testing.  While 2019 novel coronavirus  (SARS-CoV-2) nucleic acids may be present in the submitted sample  additional confirmatory testing may be necessary for epidemiological  and / or clinical management purposes  to differentiate between  SARS-CoV-2 and other Sarbecovirus currently known to infect humans.  If clinically indicated additional testing with an alternate test  methodology [phone]) is advised. The SARS-CoV-2 RNA is generally  detectable in upper and lower respiratory sp ecimens during the acute  phase of infection. The expected result is Negative. Fact Sheet for Patients:  StrictlyIdeas.no Fact Sheet for Healthcare Providers: BankingDealers.co.za This test is not yet approved or cleared by the [place] FDA and has been authorized for detection and/or diagnosis of SARS-CoV-2 by FDA under an Emergency Use Authorization (EUA).  This EUA will remain in effect (meaning this test can be used) for the duration of the COVID-19 declaration under Section 564(b)(1) of the Act, 21 U.S.C. section 360bbb-3(b)(1), unless the authorization is terminated or revoked sooner. Performed at [facility], [place] [address], [place], [place] [address]   SARS Coronavirus 2 (CEPHEID - Performed in [place] hospital lab), Hosp Order     Status: None   Collection Time: 09/15/18  9:18 AM  Result Value Ref Range Status   SARS Coronavirus 2 NEGATIVE NEGATIVE Final    Comment: (NOTE) If result is NEGATIVE SARS-CoV-2 target nucleic acids are NOT DETECTED. The SARS-CoV-2 RNA is generally detectable in upper and lower  respiratory specimens during the acute phase of infection. The lowest  concentration of SARS-CoV-2 viral copies this assay can detect is 250  copies / mL. A negative result does not preclude SARS-CoV-2 infection  and should not be used as the sole basis for treatment or  other  patient management decisions.  A negative result may occur with  improper specimen collection / handling, submission of specimen other  than nasopharyngeal swab, presence of viral mutation(s) within the  areas targeted by this assay, and inadequate number of viral copies  (<250 copies / mL). A negative result must be combined with clinical  observations, patient history, and epidemiological information. If result is POSITIVE SARS-CoV-2 target nucleic acids are DETECTED. The SARS-CoV-2 RNA is generally detectable in upper and lower  respiratory specimens dur ing the acute phase of infection.  Positive  results are indicative of active infection with SARS-CoV-2.  Clinical  correlation with patient  history and other diagnostic information is  necessary to determine patient infection status.  Positive results do  not rule out bacterial infection or co-infection with other viruses. If result is PRESUMPTIVE POSTIVE SARS-CoV-2 nucleic acids MAY BE PRESENT.   A presumptive positive result was obtained on the submitted specimen  and confirmed on repeat testing.  While 2019 novel coronavirus  (SARS-CoV-2) nucleic acids may be present in the submitted sample  additional confirmatory testing may be necessary for epidemiological  and / or clinical management purposes  to differentiate between  SARS-CoV-2 and other Sarbecovirus currently known to infect humans.  If clinically indicated additional testing with an alternate test  methodology [phone]) is advised. The SARS-CoV-2 RNA is generally  detectable in upper and lower respiratory sp ecimens during the acute  phase of infection. The expected result is Negative. Fact Sheet for Patients:  StrictlyIdeas.no Fact Sheet for Healthcare Providers: BankingDealers.co.za This test is not yet approved or cleared by the [place] FDA and has been authorized for detection and/or diagnosis of  SARS-CoV-2 by FDA under an Emergency Use Authorization (EUA).  This EUA will remain in effect (meaning this test can be used) for the duration of the COVID-19 declaration under Section 564(b)(1) of the Act, 21 U.S.C. section 360bbb-3(b)(1), unless the authorization is terminated or revoked sooner. Performed at [facility], [place] [address], [place], [place] [address]   MRSA PCR Screening     Status: None   Collection Time: 09/17/18  4:44 AM  Result Value Ref Range Status   MRSA by PCR NEGATIVE NEGATIVE Final    Comment:        The GeneXpert MRSA Assay (FDA approved for NASAL specimens only), is one component of a comprehensive MRSA colonization surveillance program. It is not intended to diagnose MRSA infection nor to guide or monitor treatment for MRSA infections. Performed at [facility], [place] [address], [place], [place] [address]          Radiology Studies: No results found.      Scheduled Meds:  amLODipine  10 mg Oral Daily   cloNIDine  0.2 mg Oral BID   divalproex  250 mg Oral BID   enoxaparin (LOVENOX) injection  0.5 mg/kg Subcutaneous Q24H   fluticasone  2 spray Each Nare Daily   furosemide  40 mg Oral BID   hydrALAZINE  100 mg Oral TID   labetalol  600 mg Oral BID   levothyroxine  75 mcg Oral Once per day on Mon Tue Wed Thu Fri   lisinopril  40 mg Oral Daily   loratadine  10 mg Oral Daily   mouth rinse  15 mL Mouth Rinse BID   perphenazine  4 mg Oral BID   perphenazine  8 mg Oral QHS   potassium chloride  40 mEq Oral BID   sodium chloride flush  3 mL Intravenous Q12H   sodium chloride flush  3 mL Intravenous Q12H   Continuous Infusions:   LOS: 0 days        Aline August, MD [facility] 09/18/2018, 10:14 AM

## 2018-09-18 NOTE — Progress Notes (Signed)
Patient unable to discharge to inpatient psych facility with oxygen. Referral sent to [facility] facilities to see if they would accept her but she has been declined by [facility] due to her age. Referral under review at [place] LCSW [phone]

## 2018-09-19 ENCOUNTER — Telehealth: Payer: Self-pay | Admitting: Cardiology

## 2018-09-19 ENCOUNTER — Ambulatory Visit (INDEPENDENT_AMBULATORY_CARE_PROVIDER_SITE_OTHER): Payer: Self-pay | Admitting: Family Medicine

## 2018-09-19 MED ORDER — SENNOSIDES-DOCUSATE SODIUM 8.6-50 MG PO TABS
1.0000 | ORAL_TABLET | Freq: Every day | ORAL | Status: DC
  Administered 2018-09-19 – 2018-09-25 (×7): 1 via ORAL
  Filled 2018-09-19 (×7): qty 1

## 2018-09-19 MED ORDER — POLYETHYLENE GLYCOL 3350 17 G PO PACK: 17.0000 g | PACK | Freq: Every day | ORAL | Status: DC | PRN

## 2018-09-19 NOTE — Progress Notes (Signed)
SATURATION QUALIFICATIONS: (This note is used to comply with regulatory documentation for home oxygen)  Patient Saturations on Room Air at Rest = 94%  Patient Saturations on Room Air while Ambulating = 80%  Patient Saturations on 1 Liters of oxygen while Ambulating = 94%  Please briefly explain why patient needs home oxygen: desat below 90 on exertion.

## 2018-09-19 NOTE — Progress Notes (Signed)
Patient ID: Madison Palmer, female   DOB: 1966/04/04, 53 y.o.   MRN: [id number]  PROGRESS NOTE    Madison Palmer  WNI:[id number] DOB: May 04, 1965 DOA: 09/15/2018 PCP: Flossie Buffy, NP   Brief Narrative:  53-year-old female with history of hyperparathyroidism, hypothyroidism, OSA on CPAP, diastolic CHF last EF noted to be 60 to 65% with grade 1 diastolic dysfunction in 38/1829, chronic lymphedema and pulmonary nodules presented with hypoxia from behavioral health where she was and from 09/12/2018 for psychosis with delusions. Per review of records, patient was previously sent home with 2 L nasal cannula oxygen and was supposed to be on CPAP at night.  However, at behavioral health, she was not getting her CPAP or her oxygen.  She was found to have oxygen saturations in the 80s and hence she was sent to the ED.  She was admitted for the same.  She was mildly volume overloaded and was found to have hypertensive urgency.  Psychiatry was consulted on 09/16/2018 and recommended psychiatric hospitalization on discharge.  Assessment & Plan:   Principal Problem:   Bipolar 1 disorder ([place]) Active Problems:   Hypothyroidism   Morbid obesity ([place])   OSA on CPAP   Lymphatic edema   Chronic diastolic heart failure ([facility])   Acute on chronic respiratory failure with hypoxia and hypercapnia ([facility])  Chronic respiratory failure with hypercapnia and hypoxia Obstructive sleep apnea -Patient's noted to be hypoxic into the 80s on room air on admission.  It appears that she is supposed to be on at least 2 L of nasal cannula oxygen 24/7 from hospitalization in 05/2018. - She is also supposed to be on CPAP at night -Continue oxygen supplementation.  Currently on oxygen via high level at 1 L/min. -Continue CPAP at night.  Bipolar disorder with psychosis -Psychiatry evaluation appreciated.  Continue one-to-one sitter -Continue Depakote, Ativan and perphenazine as per psychiatry. -Patient will need inpatient  psychiatric hospitalization.  She is medically stable for discharge.  Social worker following  Hypertensive urgency -Blood pressure better. -Continue monitoring.  Continue clonidine, labetalol, hydralazine and lisinopril and Lasix  Mild acute on chronic chronic diastolic heart failure -Last EF was noted to be 60 to 65% with mild pulmonary hypertension on 03/2018 -Chest x-ray with concerning for mild pulmonary edema.  CT angiogram was negative for PE or significant edema -Strict input and output.  Daily weights.  Fluid restriction -Continue Lasix 40 mg twice a day.  Outpatient follow-up with cardiology.  Continue hydralazine and lisinopril  Chronic lymphedema -Follow-up with lymphedema clinic  Hypothyroidism -Continue levothyroxine  Pulmonary nodules -CT angiogram did note pulmonary nodules.  Followed by pulmonology.  Outpatient follow-up with Dr. Eartha Inch  Morbid obesity -Outpatient follow-up   DVT prophylaxis: Lovenox Code Status: Full Family Communication: None at bedside  disposition Plan: Psychiatric facility once bed available.    Consultants: Psychiatry  Procedures: None  Antimicrobials: None   Subjective: Patient seen and examined at bedside.  She is a poor historian.  No overnight fever, nausea, vomiting reported. Objective: Vitals:   09/18/18 1833 09/18/18 1930 09/18/18 2121 09/19/18 0455  BP: 140/70 (!) 132/54 (!) 146/52 130/60  Pulse: 85 84 92 78  Resp:  18  20  Temp:  98.1 F (36.7 C)  98.3 F (36.8 C)  TempSrc:  Oral    SpO2: 95% 97%  97%  Weight:    (!) 138.5 kg  Height:        Intake/Output Summary (Last 24 hours) at 09/19/2018 0749 Last  data filed at 09/19/2018 0655 Gross per 24 hour  Intake 603 ml  Output 800 ml  Net -197 ml   Filed Weights   09/17/18 0550 09/18/18 0434 09/19/18 0455  Weight: (!) 137.7 kg (!) 138.6 kg (!) 138.5 kg    Examination:  General exam:  Looks older than stated age.  Morbidly obese.  No distress Respiratory  system: Bilateral decreased breath sounds at bases with some scattered crackles with no wheezing Cardiovascular system: S1 & S2 heard, Rate controlled Gastrointestinal system: Abdomen is nondistended, soft and nontender. Normal bowel sounds heard. Extremities: No cyanosis; trace lower extremity edema.     Data Reviewed: I have personally reviewed following labs and imaging studies  CBC: Recent Labs  Lab 09/15/18 0918 09/15/18 1551 09/16/18 0301  WBC 5.0  --  6.1  NEUTROABS 3.0  --   --   HGB 13.7 12.9 13.9  HCT 43.7 38.0 42.7  MCV 92.6  --  91.4  PLT 204  --  381   Basic Metabolic Panel: Recent Labs  Lab 09/15/18 0918 09/15/18 1551 09/16/18 0301 09/17/18 0241 09/18/18 0342  NA 144 140 138 140 140  K 3.8 3.7 3.3* 4.0 4.1  CL 100  --  96* 95* 98  CO2 33*  --  32 36* 34*  GLUCOSE 101*  --  140* 122* 130*  BUN 8  --  9 10 14   CREATININE 1.12*  --  0.87 0.94 0.86  CALCIUM 10.2  --  9.6 10.0 10.0  MG  --   --   --  2.0  --    GFR: Estimated Creatinine Clearance: 104.9 mL/min (by C-G formula based on SCr of 0.86 mg/dL). Liver Function Tests: Recent Labs  Lab 09/15/18 0918  AST 26  ALT 58*  ALKPHOS 73  BILITOT 1.2  PROT 5.9*  ALBUMIN 3.3*   No results for input(s): LIPASE, AMYLASE in the last 168 hours. No results for input(s): AMMONIA in the last 168 hours. Coagulation Profile: No results for input(s): INR, PROTIME in the last 168 hours. Cardiac Enzymes: Recent Labs  Lab 09/15/18 0918 09/15/18 1248  TROPONINI 0.03* 0.03*   BNP (last 3 results) Recent Labs    02/20/18 1536  PROBNP 173.0*   HbA1C: No results for input(s): HGBA1C in the last 72 hours. CBG: No results for input(s): GLUCAP in the last 168 hours. Lipid Profile: No results for input(s): CHOL, HDL, LDLCALC, TRIG, CHOLHDL, LDLDIRECT in the last 72 hours. Thyroid Function Tests: No results for input(s): TSH, T4TOTAL, FREET4, T3FREE, THYROIDAB in the last 72 hours. Anemia Panel: No results  for input(s): VITAMINB12, FOLATE, FERRITIN, TIBC, IRON, RETICCTPCT in the last 72 hours. Sepsis Labs: Recent Labs  Lab 09/15/18 0918  LATICACIDVEN 1.1    Recent Results (from the past 240 hour(s))  SARS Coronavirus 2 (CEPHEID - Performed in [facility] hospital lab), Hosp Order     Status: None   Collection Time: 09/12/18  1:39 PM  Result Value Ref Range Status   SARS Coronavirus 2 NEGATIVE NEGATIVE Final    Comment: (NOTE) If result is NEGATIVE SARS-CoV-2 target nucleic acids are NOT DETECTED. The SARS-CoV-2 RNA is generally detectable in upper and lower  respiratory specimens during the acute phase of infection. The lowest  concentration of SARS-CoV-2 viral copies this assay can detect is 250  copies / mL. A negative result does not preclude SARS-CoV-2 infection  and should not be used as the sole basis for treatment or other  patient management decisions.  A negative result may occur with  improper specimen collection / handling, submission of specimen other  than nasopharyngeal swab, presence of viral mutation(s) within the  areas targeted by this assay, and inadequate number of viral copies  (<250 copies / mL). A negative result must be combined with clinical  observations, patient history, and epidemiological information. If result is POSITIVE SARS-CoV-2 target nucleic acids are DETECTED. The SARS-CoV-2 RNA is generally detectable in upper and lower  respiratory specimens dur ing the acute phase of infection.  Positive  results are indicative of active infection with SARS-CoV-2.  Clinical  correlation with patient history and other diagnostic information is  necessary to determine patient infection status.  Positive results do  not rule out bacterial infection or co-infection with other viruses. If result is PRESUMPTIVE POSTIVE SARS-CoV-2 nucleic acids MAY BE PRESENT.   A presumptive positive result was obtained on the submitted specimen  and confirmed on repeat testing.   While 2019 novel coronavirus  (SARS-CoV-2) nucleic acids may be present in the submitted sample  additional confirmatory testing may be necessary for epidemiological  and / or clinical management purposes  to differentiate between  SARS-CoV-2 and other Sarbecovirus currently known to infect humans.  If clinically indicated additional testing with an alternate test  methodology [id number]) is advised. The SARS-CoV-2 RNA is generally  detectable in upper and lower respiratory sp ecimens during the acute  phase of infection. The expected result is Negative. Fact Sheet for Patients:  StrictlyIdeas.no Fact Sheet for Healthcare Providers: BankingDealers.co.za This test is not yet approved or cleared by the [place] FDA and has been authorized for detection and/or diagnosis of SARS-CoV-2 by FDA under an Emergency Use Authorization (EUA).  This EUA will remain in effect (meaning this test can be used) for the duration of the COVID-19 declaration under Section 564(b)(1) of the Act, 21 U.S.C. section 360bbb-3(b)(1), unless the authorization is terminated or revoked sooner. Performed at [facility], [place] [address], [place], [place] [address]   SARS Coronavirus 2 (CEPHEID - Performed in [place] hospital lab), Hosp Order     Status: None   Collection Time: 09/15/18  9:18 AM  Result Value Ref Range Status   SARS Coronavirus 2 NEGATIVE NEGATIVE Final    Comment: (NOTE) If result is NEGATIVE SARS-CoV-2 target nucleic acids are NOT DETECTED. The SARS-CoV-2 RNA is generally detectable in upper and lower  respiratory specimens during the acute phase of infection. The lowest  concentration of SARS-CoV-2 viral copies this assay can detect is 250  copies / mL. A negative result does not preclude SARS-CoV-2 infection  and should not be used as the sole basis for treatment or other  patient management decisions.  A negative result  may occur with  improper specimen collection / handling, submission of specimen other  than nasopharyngeal swab, presence of viral mutation(s) within the  areas targeted by this assay, and inadequate number of viral copies  (<250 copies / mL). A negative result must be combined with clinical  observations, patient history, and epidemiological information. If result is POSITIVE SARS-CoV-2 target nucleic acids are DETECTED. The SARS-CoV-2 RNA is generally detectable in upper and lower  respiratory specimens dur ing the acute phase of infection.  Positive  results are indicative of active infection with SARS-CoV-2.  Clinical  correlation with patient history and other diagnostic information is  necessary to determine patient infection status.  Positive results do  not rule out bacterial infection  or co-infection with other viruses. If result is PRESUMPTIVE POSTIVE SARS-CoV-2 nucleic acids MAY BE PRESENT.   A presumptive positive result was obtained on the submitted specimen  and confirmed on repeat testing.  While 2019 novel coronavirus  (SARS-CoV-2) nucleic acids may be present in the submitted sample  additional confirmatory testing may be necessary for epidemiological  and / or clinical management purposes  to differentiate between  SARS-CoV-2 and other Sarbecovirus currently known to infect humans.  If clinically indicated additional testing with an alternate test  methodology [phone]) is advised. The SARS-CoV-2 RNA is generally  detectable in upper and lower respiratory sp ecimens during the acute  phase of infection. The expected result is Negative. Fact Sheet for Patients:  StrictlyIdeas.no Fact Sheet for Healthcare Providers: BankingDealers.co.za This test is not yet approved or cleared by the [place] FDA and has been authorized for detection and/or diagnosis of SARS-CoV-2 by FDA under an Emergency Use Authorization (EUA).   This EUA will remain in effect (meaning this test can be used) for the duration of the COVID-19 declaration under Section 564(b)(1) of the Act, 21 U.S.C. section 360bbb-3(b)(1), unless the authorization is terminated or revoked sooner. Performed at [facility], [place] [address] [place], [place] [address]   MRSA PCR Screening     Status: None   Collection Time: 09/17/18  4:44 AM  Result Value Ref Range Status   MRSA by PCR NEGATIVE NEGATIVE Final    Comment:        The GeneXpert MRSA Assay (FDA approved for NASAL specimens only), is one component of a comprehensive MRSA colonization surveillance program. It is not intended to diagnose MRSA infection nor to guide or monitor treatment for MRSA infections. Performed at [facility], [place] [address], [place], [place] [address]          Radiology Studies: No results found.      Scheduled Meds: . amLODipine  10 mg Oral Daily  . cloNIDine  0.2 mg Oral BID  . divalproex  250 mg Oral BID  . enoxaparin (LOVENOX) injection  0.5 mg/kg Subcutaneous Q24H  . fluticasone  2 spray Each Nare Daily  . furosemide  40 mg Oral BID  . hydrALAZINE  100 mg Oral TID  . labetalol  600 mg Oral BID  . levothyroxine  75 mcg Oral Once per day on Mon Tue Wed Thu Fri  . lisinopril  40 mg Oral Daily  . loratadine  10 mg Oral Daily  . mouth rinse  15 mL Mouth Rinse BID  . perphenazine  4 mg Oral BID  . perphenazine  8 mg Oral QHS  . potassium chloride  40 mEq Oral BID  . sodium chloride flush  3 mL Intravenous Q12H  . sodium chloride flush  3 mL Intravenous Q12H   Continuous Infusions:   LOS: 0 days        Aline August, MD [facility] 09/19/2018, 7:49 AM

## 2018-09-19 NOTE — Progress Notes (Deleted)
{Choose 1 Note Type (Telehealth Visit or Telephone Visit):[id number]}   Date:  09/19/2018   ID:  Madison Palmer, DOB 06/24/1965, MRN [id number]  {Patient [phone]::"Home"} {Provider Location:[phone]::"Home"}  PCP:  Flossie Buffy, NP  Cardiologist:  Minus Breeding, MD *** Electrophysiologist:  None   Evaluation Performed:  {Choose Visit [phone]::"Follow-Up Visit"}  Chief Complaint:  ***  History of Present Illness:    Madison Palmer is a 53 y.o. female with ***  The patient {does/does not:[phone]} have symptoms concerning for COVID-19 infection (fever, chills, cough, or new shortness of breath).    Past Medical History:  Diagnosis Date  . Anemia   . Anxiety   . Arrhythmia    tachycardia  . Arthritis   . Chickenpox   . Depression   . Diverticulitis   . GERD (gastroesophageal reflux disease)   . Glaucoma   . Hyperlipidemia   . Hyperparathyroidism ([place])   . Hypertension   . Inflammatory polyps of colon ([place])   . LVH (left ventricular hypertrophy)   . Lymphedema   . PCOS (polycystic ovarian syndrome)   . Prediabetes   . Recurrent UTI   . Sleep apnea    CPAP  . Thyroid disease   . Vitamin D deficiency    Past Surgical History:  Procedure Laterality Date  . BREAST BIOPSY  2015  . CESAREAN SECTION  2004  . INNER EAR SURGERY     ear and sinus surgery  . LAPAROSCOPIC REPAIR AND REMOVAL OF GASTRIC BAND    . OOPHORECTOMY Left   . TONSILLECTOMY AND ADENOIDECTOMY       No outpatient medications have been marked as taking for the 09/20/18 encounter (Appointment) with Minus Breeding, MD.     Allergies:   Fentanyl; Levofloxacin; Midazolam; Pollen extract; Atorvastatin; Doxycycline; Hydralazine hcl; and Rosuvastatin   Social History   Tobacco Use  . Smoking status: Never Smoker  . Smokeless tobacco: Never Used  Substance Use Topics  . Alcohol use: Yes    Comment: social  . Drug use: No     Family Hx: The patient's family history  includes Healthy in her son; Hearing loss in her son. She was adopted.  ROS:   Please see the history of present illness.    *** All other systems reviewed and are negative.   Prior CV studies:   The following studies were reviewed today:  ***  Labs/Other Tests and Data Reviewed:    EKG:  {EKG/Telemetry Strips [phone]}  Recent Labs: 02/20/2018: Pro B Natriuretic peptide (BNP) 173.0 09/15/2018: ALT 58; B Natriuretic Peptide 147.2 09/16/2018: Hemoglobin 13.9; Platelets 194; TSH 0.409 09/17/2018: Magnesium 2.0 09/18/2018: BUN 14; Creatinine, Ser 0.86; Potassium 4.1; Sodium 140   Recent Lipid Panel Lab Results  Component Value Date/Time   CHOL 196 02/27/2018 12:33 PM   TRIG 142 02/27/2018 12:33 PM   HDL 46 02/27/2018 12:33 PM   CHOLHDL 5 09/10/2017 11:56 AM   LDLCALC 122 (H) 02/27/2018 12:33 PM    Wt Readings from Last 3 Encounters:  09/19/18 (!) 305 lb 5.4 oz (138.5 kg)  08/15/18 (!) 332 lb (150.6 kg)  07/17/18 (!) 330 lb (149.7 kg)     Objective:    Vital Signs:  LMP 05/02/2017    {[facility] Virtual Exam (Optional):[phone]::"VITAL SIGNS:  reviewed"}  ASSESSMENT & PLAN:    1. ***  COVID-19 Education: The signs and symptoms of COVID-19 were discussed with the patient and how to seek care for testing (follow up with PCP or  arrange E-visit).  ***The importance of social distancing was discussed today.  Time:   Today, I have spent *** minutes with the patient with telehealth technology discussing the above problems.     Medication Adjustments/Labs and Tests Ordered: Current medicines are reviewed at length with the patient today.  Concerns regarding medicines are outlined above.   Tests Ordered: No orders of the defined types were placed in this encounter.   Medication Changes: No orders of the defined types were placed in this encounter.   Disposition:  Follow up {follow up:15908}  Signed, Minus Breeding, MD  09/19/2018 6:33 PM    [facility]

## 2018-09-20 ENCOUNTER — Telehealth: Payer: BLUE CROSS/BLUE SHIELD | Admitting: Cardiology

## 2018-09-20 DIAGNOSIS — F259 Schizoaffective disorder, unspecified: Secondary | ICD-10-CM | POA: Diagnosis present

## 2018-09-20 DIAGNOSIS — E038 Other specified hypothyroidism: Secondary | ICD-10-CM | POA: Diagnosis not present

## 2018-09-20 DIAGNOSIS — F419 Anxiety disorder, unspecified: Secondary | ICD-10-CM | POA: Diagnosis present

## 2018-09-20 DIAGNOSIS — I110 Hypertensive heart disease with heart failure: Secondary | ICD-10-CM | POA: Diagnosis present

## 2018-09-20 DIAGNOSIS — Z6841 Body Mass Index (BMI) 40.0 and over, adult: Secondary | ICD-10-CM | POA: Diagnosis not present

## 2018-09-20 DIAGNOSIS — E282 Polycystic ovarian syndrome: Secondary | ICD-10-CM | POA: Diagnosis present

## 2018-09-20 DIAGNOSIS — J9611 Chronic respiratory failure with hypoxia: Secondary | ICD-10-CM | POA: Diagnosis not present

## 2018-09-20 DIAGNOSIS — J9621 Acute and chronic respiratory failure with hypoxia: Secondary | ICD-10-CM | POA: Diagnosis present

## 2018-09-20 DIAGNOSIS — I272 Pulmonary hypertension, unspecified: Secondary | ICD-10-CM | POA: Diagnosis present

## 2018-09-20 DIAGNOSIS — E039 Hypothyroidism, unspecified: Secondary | ICD-10-CM | POA: Diagnosis present

## 2018-09-20 DIAGNOSIS — I5032 Chronic diastolic (congestive) heart failure: Secondary | ICD-10-CM | POA: Diagnosis present

## 2018-09-20 DIAGNOSIS — I890 Lymphedema, not elsewhere classified: Secondary | ICD-10-CM | POA: Diagnosis present

## 2018-09-20 DIAGNOSIS — K219 Gastro-esophageal reflux disease without esophagitis: Secondary | ICD-10-CM | POA: Diagnosis present

## 2018-09-20 DIAGNOSIS — E213 Hyperparathyroidism, unspecified: Secondary | ICD-10-CM | POA: Diagnosis present

## 2018-09-20 DIAGNOSIS — I5033 Acute on chronic diastolic (congestive) heart failure: Secondary | ICD-10-CM | POA: Diagnosis not present

## 2018-09-20 DIAGNOSIS — H409 Unspecified glaucoma: Secondary | ICD-10-CM | POA: Diagnosis present

## 2018-09-20 DIAGNOSIS — I160 Hypertensive urgency: Secondary | ICD-10-CM | POA: Diagnosis present

## 2018-09-20 DIAGNOSIS — E209 Hypoparathyroidism, unspecified: Secondary | ICD-10-CM | POA: Diagnosis present

## 2018-09-20 DIAGNOSIS — E662 Morbid (severe) obesity with alveolar hypoventilation: Secondary | ICD-10-CM | POA: Diagnosis present

## 2018-09-20 DIAGNOSIS — F319 Bipolar disorder, unspecified: Secondary | ICD-10-CM | POA: Diagnosis present

## 2018-09-20 DIAGNOSIS — J9622 Acute and chronic respiratory failure with hypercapnia: Secondary | ICD-10-CM | POA: Diagnosis present

## 2018-09-20 LAB — BASIC METABOLIC PANEL
Anion gap: 9 (ref 5–15)
BUN: 23 mg/dL — ABNORMAL HIGH (ref 6–20)
CO2: 34 mmol/L — ABNORMAL HIGH (ref 22–32)
Calcium: 10.2 mg/dL (ref 8.9–10.3)
Chloride: 98 mmol/L (ref 98–111)
Creatinine, Ser: 0.94 mg/dL (ref 0.44–1.00)
GFR calc Af Amer: >60 mL/min (ref >60)
GFR calc non Af Amer: >60 mL/min (ref >60)
Glucose, Bld: 116 mg/dL — ABNORMAL HIGH (ref 70–99)
Potassium: 4.4 mmol/L (ref 3.5–5.1)
Sodium: 141 mmol/L (ref 135–145)

## 2018-09-20 LAB — MAGNESIUM: Magnesium: 2.1 mg/dL (ref 1.7–2.4)

## 2018-09-20 MED ORDER — ENOXAPARIN SODIUM 80 MG/0.8ML ~~LOC~~ SOLN
0.5000 mg/kg | SUBCUTANEOUS | Status: DC
  Administered 2018-09-20 – 2018-09-24 (×5): 70 mg via SUBCUTANEOUS
  Filled 2018-09-20 (×5): qty 0.8

## 2018-09-20 NOTE — Progress Notes (Addendum)
Patient ID: Madison Palmer, female   DOB: 1966/02/03, 53 y.o.   MRN: [id number]  PROGRESS NOTE    Madison Palmer  WHQ:[id number] DOB: 08-28-1965 DOA: 09/15/2018 PCP: Flossie Buffy, NP   Brief Narrative:  66-year-old female with history of hyperparathyroidism, hypothyroidism, OSA on CPAP, diastolic CHF last EF noted to be 60 to 65% with grade 1 diastolic dysfunction in 65/9935, chronic lymphedema and pulmonary nodules presented with hypoxia from behavioral health where she was and from 09/12/2018 for psychosis with delusions. Per review of records, patient was previously sent home with 2 L nasal cannula oxygen and was supposed to be on CPAP at night.  However, at behavioral health, she was not getting her CPAP or her oxygen.  She was found to have oxygen saturations in the 80s and hence she was sent to the ED.  She was admitted for the same.  She was mildly volume overloaded and was found to have hypertensive urgency.  Psychiatry was consulted on 09/16/2018 and recommended psychiatric hospitalization on discharge.  Assessment & Plan:   Principal Problem:   Bipolar 1 disorder ([place]) Active Problems:   Hypothyroidism   Morbid obesity ([place])   OSA on CPAP   Lymphatic edema   Chronic diastolic heart failure ([facility])   Acute on chronic respiratory failure with hypoxia and hypercapnia ([facility])  Chronic respiratory failure with hypercapnia and hypoxia Obstructive sleep apnea -Patient's noted to be hypoxic into the 80s on room air on admission.  It appears that she is supposed to be on at least 2 L of nasal cannula oxygen 24/7 from hospitalization in 05/2018. - She is also supposed to be on CPAP at night -Continue oxygen supplementation.  Currently on oxygen via high level at 1 L/min. -Continue CPAP at night. -Patient is still requiring supplemental oxygen via nasal cannula and is unsafe for her to be discharged home given her current psychiatric condition and psychiatry recommendation of inpatient  hospitalization.  Bipolar disorder with psychosis -Psychiatry evaluation appreciated.  Continue one-to-one sitter -Continue Depakote, Ativan and perphenazine as per psychiatry. -Patient will need inpatient psychiatric hospitalization.  She is medically stable for discharge.  Social worker following  Hypertensive urgency -Blood pressure better. -Continue monitoring.  Continue clonidine, labetalol, hydralazine and lisinopril and Lasix  Mild acute on chronic chronic diastolic heart failure -Last EF was noted to be 60 to 65% with mild pulmonary hypertension on 03/2018 -Chest x-ray with concerning for mild pulmonary edema.  CT angiogram was negative for PE or significant edema -Strict input and output.  Daily weights.  Fluid restriction -Continue Lasix 40 mg twice a day.  Outpatient follow-up with cardiology.  Continue hydralazine and lisinopril  Chronic lymphedema -Follow-up with lymphedema clinic  Hypothyroidism -Continue levothyroxine  Pulmonary nodules -CT angiogram did note pulmonary nodules.  Followed by pulmonology.  Outpatient follow-up with Dr. Elsworth Soho  Morbid obesity -Outpatient follow-up   DVT prophylaxis: Lovenox Code Status: Full Family Communication: None at bedside  disposition Plan: Psychiatric facility once bed available.    Consultants: Psychiatry  Procedures: None  Antimicrobials: None   Subjective: Patient seen and examined at bedside.  She is a poor historian.  No overnight vomiting, fever, worsening shortness of breath reported  objective: Vitals:   09/19/18 1554 09/19/18 1556 09/19/18 2156 09/20/18 0500  BP: 130/71  (!) 155/85   Pulse: 80 76 91   Resp: 18  18   Temp:   97.8 F (36.6 C)   TempSrc:   Oral   SpO2: Marland Kitchen)  88% 94% 94%   Weight:    (!) 137.4 kg  Height:        Intake/Output Summary (Last 24 hours) at 09/20/2018 0731 Last data filed at 09/20/2018 0500 Gross per 24 hour  Intake 782 ml  Output 1500 ml  Net -718 ml   Filed Weights    09/18/18 0434 09/19/18 0455 09/20/18 0500  Weight: (!) 138.6 kg (!) 138.5 kg (!) 137.4 kg    Examination:  General exam:  Looks older than stated age.  Morbidly obese.  No acute distress.  Poor historian Respiratory system: Bilateral decreased breath sounds at bases with some basilar crackles  cardiovascular system: Rate controlled, S1-S2 heard Gastrointestinal system: Abdomen is nondistended, soft and nontender. Normal bowel sounds heard. Extremities: No cyanosis; trace lower extremity edema.     Data Reviewed: I have personally reviewed following labs and imaging studies  CBC: Recent Labs  Lab 09/15/18 0918 09/15/18 1551 09/16/18 0301  WBC 5.0  --  6.1  NEUTROABS 3.0  --   --   HGB 13.7 12.9 13.9  HCT 43.7 38.0 42.7  MCV 92.6  --  91.4  PLT 204  --  379   Basic Metabolic Panel: Recent Labs  Lab 09/15/18 0918 09/15/18 1551 09/16/18 0301 09/17/18 0241 09/18/18 0342 09/20/18 0313  NA 144 140 138 140 140 141  K 3.8 3.7 3.3* 4.0 4.1 4.4  CL 100  --  96* 95* 98 98  CO2 33*  --  32 36* 34* 34*  GLUCOSE 101*  --  140* 122* 130* 116*  BUN 8  --  9 10 14  23*  CREATININE 1.12*  --  0.87 0.94 0.86 0.94  CALCIUM 10.2  --  9.6 10.0 10.0 10.2  MG  --   --   --  2.0  --  2.1   GFR: Estimated Creatinine Clearance: 95.5 mL/min (by C-G formula based on SCr of 0.94 mg/dL). Liver Function Tests: Recent Labs  Lab 09/15/18 0918  AST 26  ALT 58*  ALKPHOS 73  BILITOT 1.2  PROT 5.9*  ALBUMIN 3.3*   No results for input(s): LIPASE, AMYLASE in the last 168 hours. No results for input(s): AMMONIA in the last 168 hours. Coagulation Profile: No results for input(s): INR, PROTIME in the last 168 hours. Cardiac Enzymes: Recent Labs  Lab 09/15/18 0918 09/15/18 1248  TROPONINI 0.03* 0.03*   BNP (last 3 results) Recent Labs    02/20/18 1536  PROBNP 173.0*   HbA1C: No results for input(s): HGBA1C in the last 72 hours. CBG: No results for input(s): GLUCAP in the last 168  hours. Lipid Profile: No results for input(s): CHOL, HDL, LDLCALC, TRIG, CHOLHDL, LDLDIRECT in the last 72 hours. Thyroid Function Tests: No results for input(s): TSH, T4TOTAL, FREET4, T3FREE, THYROIDAB in the last 72 hours. Anemia Panel: No results for input(s): VITAMINB12, FOLATE, FERRITIN, TIBC, IRON, RETICCTPCT in the last 72 hours. Sepsis Labs: Recent Labs  Lab 09/15/18 0918  LATICACIDVEN 1.1    Recent Results (from the past 240 hour(s))  SARS Coronavirus 2 (CEPHEID - Performed in [facility] hospital lab), Hosp Order     Status: None   Collection Time: 09/12/18  1:39 PM  Result Value Ref Range Status   SARS Coronavirus 2 NEGATIVE NEGATIVE Final    Comment: (NOTE) If result is NEGATIVE SARS-CoV-2 target nucleic acids are NOT DETECTED. The SARS-CoV-2 RNA is generally detectable in upper and lower  respiratory specimens during the acute phase of infection.  The lowest  concentration of SARS-CoV-2 viral copies this assay can detect is 250  copies / mL. A negative result does not preclude SARS-CoV-2 infection  and should not be used as the sole basis for treatment or other  patient management decisions.  A negative result may occur with  improper specimen collection / handling, submission of specimen other  than nasopharyngeal swab, presence of viral mutation(s) within the  areas targeted by this assay, and inadequate number of viral copies  (<250 copies / mL). A negative result must be combined with clinical  observations, patient history, and epidemiological information. If result is POSITIVE SARS-CoV-2 target nucleic acids are DETECTED. The SARS-CoV-2 RNA is generally detectable in upper and lower  respiratory specimens dur ing the acute phase of infection.  Positive  results are indicative of active infection with SARS-CoV-2.  Clinical  correlation with patient history and other diagnostic information is  necessary to determine patient infection status.  Positive results  do  not rule out bacterial infection or co-infection with other viruses. If result is PRESUMPTIVE POSTIVE SARS-CoV-2 nucleic acids MAY BE PRESENT.   A presumptive positive result was obtained on the submitted specimen  and confirmed on repeat testing.  While 2019 novel coronavirus  (SARS-CoV-2) nucleic acids may be present in the submitted sample  additional confirmatory testing may be necessary for epidemiological  and / or clinical management purposes  to differentiate between  SARS-CoV-2 and other Sarbecovirus currently known to infect humans.  If clinically indicated additional testing with an alternate test  methodology [id number]) is advised. The SARS-CoV-2 RNA is generally  detectable in upper and lower respiratory sp ecimens during the acute  phase of infection. The expected result is Negative. Fact Sheet for Patients:  StrictlyIdeas.no Fact Sheet for Healthcare Providers: BankingDealers.co.za This test is not yet approved or cleared by the [place] FDA and has been authorized for detection and/or diagnosis of SARS-CoV-2 by FDA under an Emergency Use Authorization (EUA).  This EUA will remain in effect (meaning this test can be used) for the duration of the COVID-19 declaration under Section 564(b)(1) of the Act, 21 U.S.C. section 360bbb-3(b)(1), unless the authorization is terminated or revoked sooner. Performed at [facility], [place] [address], [place], [place] [address]   SARS Coronavirus 2 (CEPHEID - Performed in [place] hospital lab), Hosp Order     Status: None   Collection Time: 09/15/18  9:18 AM  Result Value Ref Range Status   SARS Coronavirus 2 NEGATIVE NEGATIVE Final    Comment: (NOTE) If result is NEGATIVE SARS-CoV-2 target nucleic acids are NOT DETECTED. The SARS-CoV-2 RNA is generally detectable in upper and lower  respiratory specimens during the acute phase of infection. The lowest   concentration of SARS-CoV-2 viral copies this assay can detect is 250  copies / mL. A negative result does not preclude SARS-CoV-2 infection  and should not be used as the sole basis for treatment or other  patient management decisions.  A negative result may occur with  improper specimen collection / handling, submission of specimen other  than nasopharyngeal swab, presence of viral mutation(s) within the  areas targeted by this assay, and inadequate number of viral copies  (<250 copies / mL). A negative result must be combined with clinical  observations, patient history, and epidemiological information. If result is POSITIVE SARS-CoV-2 target nucleic acids are DETECTED. The SARS-CoV-2 RNA is generally detectable in upper and lower  respiratory specimens dur ing the acute phase of  infection.  Positive  results are indicative of active infection with SARS-CoV-2.  Clinical  correlation with patient history and other diagnostic information is  necessary to determine patient infection status.  Positive results do  not rule out bacterial infection or co-infection with other viruses. If result is PRESUMPTIVE POSTIVE SARS-CoV-2 nucleic acids MAY BE PRESENT.   A presumptive positive result was obtained on the submitted specimen  and confirmed on repeat testing.  While 2019 novel coronavirus  (SARS-CoV-2) nucleic acids may be present in the submitted sample  additional confirmatory testing may be necessary for epidemiological  and / or clinical management purposes  to differentiate between  SARS-CoV-2 and other Sarbecovirus currently known to infect humans.  If clinically indicated additional testing with an alternate test  methodology [id number]) is advised. The SARS-CoV-2 RNA is generally  detectable in upper and lower respiratory sp ecimens during the acute  phase of infection. The expected result is Negative. Fact Sheet for Patients:  StrictlyIdeas.no Fact Sheet  for Healthcare Providers: BankingDealers.co.za This test is not yet approved or cleared by the [place] FDA and has been authorized for detection and/or diagnosis of SARS-CoV-2 by FDA under an Emergency Use Authorization (EUA).  This EUA will remain in effect (meaning this test can be used) for the duration of the COVID-19 declaration under Section 564(b)(1) of the Act, 21 U.S.C. section 360bbb-3(b)(1), unless the authorization is terminated or revoked sooner. Performed at [facility], [place] [address] [place], [place] [address]   MRSA PCR Screening     Status: None   Collection Time: 09/17/18  4:44 AM  Result Value Ref Range Status   MRSA by PCR NEGATIVE NEGATIVE Final    Comment:        The GeneXpert MRSA Assay (FDA approved for NASAL specimens only), is one component of a comprehensive MRSA colonization surveillance program. It is not intended to diagnose MRSA infection nor to guide or monitor treatment for MRSA infections. Performed at [facility], [place] [address] [place], [place] [address]          Radiology Studies: No results found.      Scheduled Meds: . amLODipine  10 mg Oral Daily  . cloNIDine  0.2 mg Oral BID  . divalproex  250 mg Oral BID  . enoxaparin (LOVENOX) injection  0.5 mg/kg Subcutaneous Q24H  . fluticasone  2 spray Each Nare Daily  . furosemide  40 mg Oral BID  . hydrALAZINE  100 mg Oral TID  . labetalol  600 mg Oral BID  . levothyroxine  75 mcg Oral Once per day on Mon Tue Wed Thu Fri  . lisinopril  40 mg Oral Daily  . loratadine  10 mg Oral Daily  . mouth rinse  15 mL Mouth Rinse BID  . perphenazine  4 mg Oral BID  . perphenazine  8 mg Oral QHS  . potassium chloride  40 mEq Oral BID  . senna-docusate  1 tablet Oral Daily  . sodium chloride flush  3 mL Intravenous Q12H  . sodium chloride flush  3 mL Intravenous Q12H   Continuous Infusions:   LOS: 0 days        Aline August, MD [facility] 09/20/2018, 7:31 AM

## 2018-09-20 NOTE — Progress Notes (Addendum)
Pt not placed on CPAP at this time due to confusion/AMS. Pt has sitter in the room .

## 2018-09-20 NOTE — Progress Notes (Signed)
Patient not placed on CPAP due to confusion. CPAP not in room.

## 2018-09-20 NOTE — Progress Notes (Addendum)
There continues to be no psych beds available. CSW checked with [facility], [facility], [facility], and [facility] and they do not accept patients on oxygen. [facility] requested clinicals be faxed again.   Percell Locus Giovanie Lefebre LCSW [phone]

## 2018-09-21 LAB — GLUCOSE, CAPILLARY: Glucose-Capillary: 139 mg/dL — ABNORMAL HIGH (ref 70–99)

## 2018-09-21 MED ORDER — FUROSEMIDE 40 MG PO TABS
40.0000 mg | ORAL_TABLET | Freq: Every day | ORAL | Status: DC
  Administered 2018-09-22: 40 mg via ORAL
  Filled 2018-09-21: qty 1

## 2018-09-21 NOTE — Progress Notes (Signed)
Patient ID: Madison Palmer, female   DOB: [birth year], 53 y.o.   MRN: [id number]  PROGRESS NOTE    SOLVEIG FANGMAN  XTG:[id number] DOB: 01-13-1966 DOA: 09/15/2018 PCP: Flossie Buffy, NP   Brief Narrative:  53-year-old female with history of hyperparathyroidism, hypothyroidism, OSA on CPAP, diastolic CHF last EF noted to be 60 to 65% with grade 1 diastolic dysfunction in 27/0350, chronic lymphedema and pulmonary nodules presented with hypoxia from behavioral health where she was and from 09/12/2018 for psychosis with delusions. Per review of records, patient was previously sent home with 2 L nasal cannula oxygen and was supposed to be on CPAP at night.  However, at behavioral health, she was not getting her CPAP or her oxygen.  She was found to have oxygen saturations in the 80s and hence she was sent to the ED.  She was admitted for the same.  She was mildly volume overloaded and was found to have hypertensive urgency.  Psychiatry was consulted on 09/16/2018 and recommended psychiatric hospitalization on discharge.  Assessment & Plan:   Principal Problem:   Bipolar 1 disorder ([place]) Active Problems:   Hypothyroidism   Morbid obesity ([place])   OSA on CPAP   Lymphatic edema   Chronic diastolic heart failure ([facility])   Chronic respiratory failure ([facility])   Acute on chronic respiratory failure with hypoxia and hypercapnia ([facility])  Chronic respiratory failure with hypercapnia and hypoxia Obstructive sleep apnea -Patient's noted to be hypoxic into the 80s on room air on admission.  It appears that she is supposed to be on at least 2 L of nasal cannula oxygen 24/7 from hospitalization in 05/2018. - She is also supposed to be on CPAP at night -Continue oxygen supplementation.  Currently on oxygen via nasal cannula at 2 L/min. -Continue CPAP at night. -Patient is still requiring supplemental oxygen via nasal cannula and is unsafe for her to be discharged home given her current psychiatric condition and  psychiatry recommendation of inpatient hospitalization.  Bipolar disorder with psychosis -Psychiatry evaluation appreciated.  Continue one-to-one sitter -Continue Depakote, Ativan and perphenazine as per psychiatry. -Patient will need inpatient psychiatric hospitalization.  She is medically stable for discharge.  Social worker following  Hypertensive urgency -Blood pressure better. -Continue monitoring.  Continue clonidine, labetalol, hydralazine and lisinopril.  Lasix plan as below.  Mild acute on chronic chronic diastolic heart failure -Last EF was noted to be 60 to 65% with mild pulmonary hypertension on 03/2018 -Chest x-ray with concerning for mild pulmonary edema.  CT angiogram was negative for PE or significant edema -Strict input and output.  Daily weights.  Fluid restriction -Decrease Lasix to 40 mg daily due to increasing BUN.  Outpatient follow-up with cardiology.  Continue hydralazine and lisinopril  Chronic lymphedema -Follow-up with lymphedema clinic  Hypothyroidism -Continue levothyroxine  Pulmonary nodules -CT angiogram did note pulmonary nodules.  Followed by pulmonology.  Outpatient follow-up with Dr. Elsworth Soho  Morbid obesity -Outpatient follow-up   DVT prophylaxis: Lovenox Code Status: Full Family Communication: None at bedside  disposition Plan: Psychiatric facility once bed available.    Consultants: Psychiatry  Procedures: None  Antimicrobials: None   Subjective: Patient seen and examined at bedside.  She is a poor historian.  She is sleepy, wakes up only very slightly on calling her name.  Does not answer much questions.  No overnight fever or vomiting reported by nursing staff.   Objective: Vitals:   09/20/18 1808 09/20/18 2140 09/21/18 0612 09/21/18 0916  BP: (!) 148/64 Marland Kitchen)  147/77 (!) 133/57 132/71  Pulse: 79 77 72 69  Resp:   20   Temp:  97.8 F (36.6 C) 97.9 F (36.6 C)   TempSrc:   Oral   SpO2:  91% 97%   Weight:   (!) 138.3 kg   Height:         Intake/Output Summary (Last 24 hours) at 09/21/2018 0954 Last data filed at 09/21/2018 0900 Gross per 24 hour  Intake 720 ml  Output 1150 ml  Net -430 ml   Filed Weights   09/19/18 0455 09/20/18 0500 09/21/18 0612  Weight: (!) 138.5 kg (!) 137.4 kg (!) 138.3 kg    Examination:  General exam:  Looks older than stated age.  Morbidly obese.  Sleepy, hardly answers any questions on calling her name.  Poor historian  respiratory system: Bilateral decreased breath sounds at bases with some basilar crackles.  No wheezing cardiovascular system: S1-S2 heard, rate controlled Gastrointestinal system: Abdomen is morbidly obese, nondistended, soft and nontender. Normal bowel sounds heard. Extremities: No cyanosis; trace lower extremity edema.     Data Reviewed: I have personally reviewed following labs and imaging studies  CBC: Recent Labs  Lab 09/15/18 0918 09/15/18 1551 09/16/18 0301  WBC 5.0  --  6.1  NEUTROABS 3.0  --   --   HGB 13.7 12.9 13.9  HCT 43.7 38.0 42.7  MCV 92.6  --  91.4  PLT 204  --  891   Basic Metabolic Panel: Recent Labs  Lab 09/15/18 0918 09/15/18 1551 09/16/18 0301 09/17/18 0241 09/18/18 0342 09/20/18 0313  NA 144 140 138 140 140 141  K 3.8 3.7 3.3* 4.0 4.1 4.4  CL 100  --  96* 95* 98 98  CO2 33*  --  32 36* 34* 34*  GLUCOSE 101*  --  140* 122* 130* 116*  BUN 8  --  9 10 14  23*  CREATININE 1.12*  --  0.87 0.94 0.86 0.94  CALCIUM 10.2  --  9.6 10.0 10.0 10.2  MG  --   --   --  2.0  --  2.1   GFR: Estimated Creatinine Clearance: 95.9 mL/min (by C-G formula based on SCr of 0.94 mg/dL). Liver Function Tests: Recent Labs  Lab 09/15/18 0918  AST 26  ALT 58*  ALKPHOS 73  BILITOT 1.2  PROT 5.9*  ALBUMIN 3.3*   No results for input(s): LIPASE, AMYLASE in the last 168 hours. No results for input(s): AMMONIA in the last 168 hours. Coagulation Profile: No results for input(s): INR, PROTIME in the last 168 hours. Cardiac Enzymes: Recent  Labs  Lab 09/15/18 0918 09/15/18 1248  TROPONINI 0.03* 0.03*   BNP (last 3 results) Recent Labs    02/20/18 1536  PROBNP 173.0*   HbA1C: No results for input(s): HGBA1C in the last 72 hours. CBG: Recent Labs  Lab 09/21/18 0941  GLUCAP 139*   Lipid Profile: No results for input(s): CHOL, HDL, LDLCALC, TRIG, CHOLHDL, LDLDIRECT in the last 72 hours. Thyroid Function Tests: No results for input(s): TSH, T4TOTAL, FREET4, T3FREE, THYROIDAB in the last 72 hours. Anemia Panel: No results for input(s): VITAMINB12, FOLATE, FERRITIN, TIBC, IRON, RETICCTPCT in the last 72 hours. Sepsis Labs: Recent Labs  Lab 09/15/18 0918  LATICACIDVEN 1.1    Recent Results (from the past 240 hour(s))  SARS Coronavirus 2 (CEPHEID - Performed in [facility] hospital lab), Hosp Order     Status: None   Collection Time: 09/12/18  1:39 PM  Result Value Ref Range Status   SARS Coronavirus 2 NEGATIVE NEGATIVE Final    Comment: (NOTE) If result is NEGATIVE SARS-CoV-2 target nucleic acids are NOT DETECTED. The SARS-CoV-2 RNA is generally detectable in upper and lower  respiratory specimens during the acute phase of infection. The lowest  concentration of SARS-CoV-2 viral copies this assay can detect is 250  copies / mL. A negative result does not preclude SARS-CoV-2 infection  and should not be used as the sole basis for treatment or other  patient management decisions.  A negative result may occur with  improper specimen collection / handling, submission of specimen other  than nasopharyngeal swab, presence of viral mutation(s) within the  areas targeted by this assay, and inadequate number of viral copies  (<250 copies / mL). A negative result must be combined with clinical  observations, patient history, and epidemiological information. If result is POSITIVE SARS-CoV-2 target nucleic acids are DETECTED. The SARS-CoV-2 RNA is generally detectable in upper and lower  respiratory specimens dur ing  the acute phase of infection.  Positive  results are indicative of active infection with SARS-CoV-2.  Clinical  correlation with patient history and other diagnostic information is  necessary to determine patient infection status.  Positive results do  not rule out bacterial infection or co-infection with other viruses. If result is PRESUMPTIVE POSTIVE SARS-CoV-2 nucleic acids MAY BE PRESENT.   A presumptive positive result was obtained on the submitted specimen  and confirmed on repeat testing.  While 2019 novel coronavirus  (SARS-CoV-2) nucleic acids may be present in the submitted sample  additional confirmatory testing may be necessary for epidemiological  and / or clinical management purposes  to differentiate between  SARS-CoV-2 and other Sarbecovirus currently known to infect humans.  If clinically indicated additional testing with an alternate test  methodology [phone]) is advised. The SARS-CoV-2 RNA is generally  detectable in upper and lower respiratory sp ecimens during the acute  phase of infection. The expected result is Negative. Fact Sheet for Patients:  StrictlyIdeas.no Fact Sheet for Healthcare Providers: BankingDealers.co.za This test is not yet approved or cleared by the [place] FDA and has been authorized for detection and/or diagnosis of SARS-CoV-2 by FDA under an Emergency Use Authorization (EUA).  This EUA will remain in effect (meaning this test can be used) for the duration of the COVID-19 declaration under Section 564(b)(1) of the Act, 21 U.S.C. section 360bbb-3(b)(1), unless the authorization is terminated or revoked sooner. Performed at [facility], [place] [address] [place], [place] [address]   SARS Coronavirus 2 (CEPHEID - Performed in [place] hospital lab), Hosp Order     Status: None   Collection Time: 09/15/18  9:18 AM  Result Value Ref Range Status   SARS Coronavirus 2  NEGATIVE NEGATIVE Final    Comment: (NOTE) If result is NEGATIVE SARS-CoV-2 target nucleic acids are NOT DETECTED. The SARS-CoV-2 RNA is generally detectable in upper and lower  respiratory specimens during the acute phase of infection. The lowest  concentration of SARS-CoV-2 viral copies this assay can detect is 250  copies / mL. A negative result does not preclude SARS-CoV-2 infection  and should not be used as the sole basis for treatment or other  patient management decisions.  A negative result may occur with  improper specimen collection / handling, submission of specimen other  than nasopharyngeal swab, presence of viral mutation(s) within the  areas targeted by this assay, and inadequate number of viral copies  (<  250 copies / mL). A negative result must be combined with clinical  observations, patient history, and epidemiological information. If result is POSITIVE SARS-CoV-2 target nucleic acids are DETECTED. The SARS-CoV-2 RNA is generally detectable in upper and lower  respiratory specimens dur ing the acute phase of infection.  Positive  results are indicative of active infection with SARS-CoV-2.  Clinical  correlation with patient history and other diagnostic information is  necessary to determine patient infection status.  Positive results do  not rule out bacterial infection or co-infection with other viruses. If result is PRESUMPTIVE POSTIVE SARS-CoV-2 nucleic acids MAY BE PRESENT.   A presumptive positive result was obtained on the submitted specimen  and confirmed on repeat testing.  While 2019 novel coronavirus  (SARS-CoV-2) nucleic acids may be present in the submitted sample  additional confirmatory testing may be necessary for epidemiological  and / or clinical management purposes  to differentiate between  SARS-CoV-2 and other Sarbecovirus currently known to infect humans.  If clinically indicated additional testing with an alternate test  methodology [phone])  is advised. The SARS-CoV-2 RNA is generally  detectable in upper and lower respiratory sp ecimens during the acute  phase of infection. The expected result is Negative. Fact Sheet for Patients:  StrictlyIdeas.no Fact Sheet for Healthcare Providers: BankingDealers.co.za This test is not yet approved or cleared by the [place] FDA and has been authorized for detection and/or diagnosis of SARS-CoV-2 by FDA under an Emergency Use Authorization (EUA).  This EUA will remain in effect (meaning this test can be used) for the duration of the COVID-19 declaration under Section 564(b)(1) of the Act, 21 U.S.C. section 360bbb-3(b)(1), unless the authorization is terminated or revoked sooner. Performed at [facility], [place] [address], [place], [place] [address]   MRSA PCR Screening     Status: None   Collection Time: 09/17/18  4:44 AM  Result Value Ref Range Status   MRSA by PCR NEGATIVE NEGATIVE Final    Comment:        The GeneXpert MRSA Assay (FDA approved for NASAL specimens only), is one component of a comprehensive MRSA colonization surveillance program. It is not intended to diagnose MRSA infection nor to guide or monitor treatment for MRSA infections. Performed at [facility], [place] [address] [place], [place] [address]          Radiology Studies: No results found.      Scheduled Meds: . amLODipine  10 mg Oral Daily  . cloNIDine  0.2 mg Oral BID  . divalproex  250 mg Oral BID  . enoxaparin (LOVENOX) injection  0.5 mg/kg Subcutaneous Q24H  . fluticasone  2 spray Each Nare Daily  . furosemide  40 mg Oral BID  . hydrALAZINE  100 mg Oral TID  . labetalol  600 mg Oral BID  . levothyroxine  75 mcg Oral Once per day on Mon Tue Wed Thu Fri  . lisinopril  40 mg Oral Daily  . loratadine  10 mg Oral Daily  . mouth rinse  15 mL Mouth Rinse BID  . perphenazine  4 mg Oral BID  . perphenazine  8 mg Oral QHS  .  potassium chloride  40 mEq Oral BID  . senna-docusate  1 tablet Oral Daily  . sodium chloride flush  3 mL Intravenous Q12H  . sodium chloride flush  3 mL Intravenous Q12H   Continuous Infusions:   LOS: 1 day        Parris Cudworth Starla Link,  MD [facility] 09/21/2018, 9:54 AM

## 2018-09-22 NOTE — Progress Notes (Signed)
Pt has not worn cpap since 09-17-18. RT went to speak with pt and pt has a Actuary. RT will continue to monitor.  Order will be dc'd.

## 2018-09-22 NOTE — Progress Notes (Signed)
Patient ID: Madison Palmer, female   DOB: May 15, 1965, 53 y.o.   MRN: [id number]  PROGRESS NOTE    BRIASIA FLINDERS  BWI:[id number] DOB: 1965/07/27 DOA: 09/15/2018 PCP: Flossie Buffy, NP   Brief Narrative:  53-year-old female with history of hyperparathyroidism, hypothyroidism, OSA on CPAP, diastolic CHF last EF noted to be 60 to 65% with grade 1 diastolic dysfunction in 63/8453, chronic lymphedema and pulmonary nodules presented with hypoxia from behavioral health where she was and from 09/12/2018 for psychosis with delusions. Per review of records, patient was previously sent home with 2 L nasal cannula oxygen and was supposed to be on CPAP at night.  However, at behavioral health, she was not getting her CPAP or her oxygen.  She was found to have oxygen saturations in the 80s and hence she was sent to the ED.  She was admitted for the same.  She was mildly volume overloaded and was found to have hypertensive urgency.  Psychiatry was consulted on 09/16/2018 and recommended psychiatric hospitalization on discharge.  Assessment & Plan:   Principal Problem:   Bipolar 1 disorder ([place]) Active Problems:   Hypothyroidism   Morbid obesity ([place])   OSA on CPAP   Lymphatic edema   Chronic diastolic heart failure (H[facility])   Chronic respiratory failure ([facility])   Acute on chronic respiratory failure with hypoxia and hypercapnia ([facility])  Chronic respiratory failure with hypercapnia and hypoxia Obstructive sleep apnea -Patient's noted to be hypoxic into the 80s on room air on admission.  It appears that she is supposed to be on at least 2 L of nasal cannula oxygen 24/7 from hospitalization in 05/2018. - She is also supposed to be on CPAP at night -Continue oxygen supplementation.  Currently on oxygen via nasal cannula at 2 L/min. -Continue CPAP at night. -Patient is still requiring supplemental oxygen via nasal cannula and is unsafe for her to be discharged home given her current psychiatric condition and  psychiatry recommendation of inpatient hospitalization.  Bipolar disorder with psychosis -Psychiatry evaluation appreciated.  Continue one-to-one sitter -Continue Depakote, Ativan and perphenazine as per psychiatry. -Patient will need inpatient psychiatric hospitalization.  She is medically stable for discharge.  Social worker following  Hypertensive urgency -Blood pressure better. -Continue monitoring.  Continue clonidine, labetalol, hydralazine and lisinopril and Lasix.   Mild acute on chronic chronic diastolic heart failure -Last EF was noted to be 53 to 65% with mild pulmonary hypertension on 03/2018 -Chest x-ray with concerning for mild pulmonary edema.  CT angiogram was negative for PE or significant edema -Strict input and output.  Daily weights.  Fluid restriction -Continue Lasix 40 mg daily.  Outpatient follow-up with cardiology.  Continue hydralazine and lisinopril -Monitor creatinine.  Chronic lymphedema -Follow-up with lymphedema clinic  Hypothyroidism -Continue levothyroxine  Pulmonary nodules -CT angiogram did note pulmonary nodules.  Followed by pulmonology.  Outpatient follow-up with Dr. Elsworth Soho  Morbid obesity -Outpatient follow-up   DVT prophylaxis: Lovenox Code Status: Full Family Communication: None at bedside  disposition Plan: Psychiatric facility once bed available.    Consultants: Psychiatry  Procedures: None  Antimicrobials: None   Subjective: Patient seen and examined at bedside.  She is sleepy, hardly wakes up on calling her name.  No overnight fever, vomiting reported by nursing staff.  No worsening shortness of breath.    Objective: Vitals:   09/21/18 1455 09/21/18 1508 09/21/18 2113 09/22/18 0656  BP: (!) 100/41 (!) 106/47 (!) 142/73 140/74  Pulse: 72  72 60  Resp:  18 15  Temp:   98 F (36.7 C) 98.4 F (36.9 C)  TempSrc:   Oral Oral  SpO2:   96% 99%  Weight:    (!) 136.1 kg  Height:        Intake/Output Summary (Last 24 hours)  at 09/22/2018 0738 Last data filed at 09/22/2018 0300 Gross per 24 hour  Intake 503 ml  Output 1075 ml  Net -572 ml   Filed Weights   09/20/18 0500 09/21/18 0612 09/22/18 0656  Weight: (!) 137.4 kg (!) 138.3 kg (!) 136.1 kg    Examination:  General exam:  Looks older than stated age.  Morbidly obese.  Sleepy, hardly looks up on calling her name.   Respiratory system: Bilateral decreased breath sounds at bases with some bibasilar crackles.   cardiovascular system: Rate controlled, S1-S2 heard Gastrointestinal system: Abdomen is morbidly obese, nondistended, soft and nontender. Normal bowel sounds heard. Extremities: No cyanosis; trace lower extremity edema.     Data Reviewed: I have personally reviewed following labs and imaging studies  CBC: Recent Labs  Lab 09/15/18 0918 09/15/18 1551 09/16/18 0301  WBC 5.0  --  6.1  NEUTROABS 3.0  --   --   HGB 13.7 12.9 13.9  HCT 43.7 38.0 42.7  MCV 92.6  --  91.4  PLT 204  --  277   Basic Metabolic Panel: Recent Labs  Lab 09/15/18 0918 09/15/18 1551 09/16/18 0301 09/17/18 0241 09/18/18 0342 09/20/18 0313  NA 144 140 138 140 140 141  K 3.8 3.7 3.3* 4.0 4.1 4.4  CL 100  --  96* 95* 98 98  CO2 33*  --  32 36* 34* 34*  GLUCOSE 101*  --  140* 122* 130* 116*  BUN 8  --  9 10 14  23*  CREATININE 1.12*  --  0.87 0.94 0.86 0.94  CALCIUM 10.2  --  9.6 10.0 10.0 10.2  MG  --   --   --  2.0  --  2.1   GFR: Estimated Creatinine Clearance: 94.9 mL/min (by C-G formula based on SCr of 0.94 mg/dL). Liver Function Tests: Recent Labs  Lab 09/15/18 0918  AST 26  ALT 58*  ALKPHOS 73  BILITOT 1.2  PROT 5.9*  ALBUMIN 3.3*   No results for input(s): LIPASE, AMYLASE in the last 168 hours. No results for input(s): AMMONIA in the last 168 hours. Coagulation Profile: No results for input(s): INR, PROTIME in the last 168 hours. Cardiac Enzymes: Recent Labs  Lab 09/15/18 0918 09/15/18 1248  TROPONINI 0.03* 0.03*   BNP (last 3  results) Recent Labs    02/20/18 1536  PROBNP 173.0*   HbA1C: No results for input(s): HGBA1C in the last 72 hours. CBG: Recent Labs  Lab 09/21/18 0941  GLUCAP 139*   Lipid Profile: No results for input(s): CHOL, HDL, LDLCALC, TRIG, CHOLHDL, LDLDIRECT in the last 72 hours. Thyroid Function Tests: No results for input(s): TSH, T4TOTAL, FREET4, T3FREE, THYROIDAB in the last 72 hours. Anemia Panel: No results for input(s): VITAMINB12, FOLATE, FERRITIN, TIBC, IRON, RETICCTPCT in the last 72 hours. Sepsis Labs: Recent Labs  Lab 09/15/18 0918  LATICACIDVEN 1.1    Recent Results (from the past 240 hour(s))  SARS Coronavirus 2 (CEPHEID - Performed in [facility] hospital lab), Hosp Order     Status: None   Collection Time: 09/12/18  1:39 PM  Result Value Ref Range Status   SARS Coronavirus 2 NEGATIVE NEGATIVE Final    Comment: (NOTE)  If result is NEGATIVE SARS-CoV-2 target nucleic acids are NOT DETECTED. The SARS-CoV-2 RNA is generally detectable in upper and lower  respiratory specimens during the acute phase of infection. The lowest  concentration of SARS-CoV-2 viral copies this assay can detect is 250  copies / mL. A negative result does not preclude SARS-CoV-2 infection  and should not be used as the sole basis for treatment or other  patient management decisions.  A negative result may occur with  improper specimen collection / handling, submission of specimen other  than nasopharyngeal swab, presence of viral mutation(s) within the  areas targeted by this assay, and inadequate number of viral copies  (<250 copies / mL). A negative result must be combined with clinical  observations, patient history, and epidemiological information. If result is POSITIVE SARS-CoV-2 target nucleic acids are DETECTED. The SARS-CoV-2 RNA is generally detectable in upper and lower  respiratory specimens dur ing the acute phase of infection.  Positive  results are indicative of active  infection with SARS-CoV-2.  Clinical  correlation with patient history and other diagnostic information is  necessary to determine patient infection status.  Positive results do  not rule out bacterial infection or co-infection with other viruses. If result is PRESUMPTIVE POSTIVE SARS-CoV-2 nucleic acids MAY BE PRESENT.   A presumptive positive result was obtained on the submitted specimen  and confirmed on repeat testing.  While 2019 novel coronavirus  (SARS-CoV-2) nucleic acids may be present in the submitted sample  additional confirmatory testing may be necessary for epidemiological  and / or clinical management purposes  to differentiate between  SARS-CoV-2 and other Sarbecovirus currently known to infect humans.  If clinically indicated additional testing with an alternate test  methodology [phone]) is advised. The SARS-CoV-2 RNA is generally  detectable in upper and lower respiratory sp ecimens during the acute  phase of infection. The expected result is Negative. Fact Sheet for Patients:  StrictlyIdeas.no Fact Sheet for Healthcare Providers: BankingDealers.co.za This test is not yet approved or cleared by the [place] FDA and has been authorized for detection and/or diagnosis of SARS-CoV-2 by FDA under an Emergency Use Authorization (EUA).  This EUA will remain in effect (meaning this test can be used) for the duration of the COVID-19 declaration under Section 564(b)(1) of the Act, 21 U.S.C. section 360bbb-3(b)(1), unless the authorization is terminated or revoked sooner. Performed at [facility], [place] [address] [place], [place] [address]   SARS Coronavirus 2 (CEPHEID - Performed in [place] hospital lab), Hosp Order     Status: None   Collection Time: 09/15/18  9:18 AM  Result Value Ref Range Status   SARS Coronavirus 2 NEGATIVE NEGATIVE Final    Comment: (NOTE) If result is NEGATIVE SARS-CoV-2  target nucleic acids are NOT DETECTED. The SARS-CoV-2 RNA is generally detectable in upper and lower  respiratory specimens during the acute phase of infection. The lowest  concentration of SARS-CoV-2 viral copies this assay can detect is 250  copies / mL. A negative result does not preclude SARS-CoV-2 infection  and should not be used as the sole basis for treatment or other  patient management decisions.  A negative result may occur with  improper specimen collection / handling, submission of specimen other  than nasopharyngeal swab, presence of viral mutation(s) within the  areas targeted by this assay, and inadequate number of viral copies  (<250 copies / mL). A negative result must be combined with clinical  observations, patient history, and epidemiological  information. If result is POSITIVE SARS-CoV-2 target nucleic acids are DETECTED. The SARS-CoV-2 RNA is generally detectable in upper and lower  respiratory specimens dur ing the acute phase of infection.  Positive  results are indicative of active infection with SARS-CoV-2.  Clinical  correlation with patient history and other diagnostic information is  necessary to determine patient infection status.  Positive results do  not rule out bacterial infection or co-infection with other viruses. If result is PRESUMPTIVE POSTIVE SARS-CoV-2 nucleic acids MAY BE PRESENT.   A presumptive positive result was obtained on the submitted specimen  and confirmed on repeat testing.  While 2019 novel coronavirus  (SARS-CoV-2) nucleic acids may be present in the submitted sample  additional confirmatory testing may be necessary for epidemiological  and / or clinical management purposes  to differentiate between  SARS-CoV-2 and other Sarbecovirus currently known to infect humans.  If clinically indicated additional testing with an alternate test  methodology [phone]) is advised. The SARS-CoV-2 RNA is generally  detectable in upper and lower  respiratory sp ecimens during the acute  phase of infection. The expected result is Negative. Fact Sheet for Patients:  StrictlyIdeas.no Fact Sheet for Healthcare Providers: BankingDealers.co.za This test is not yet approved or cleared by the [place] FDA and has been authorized for detection and/or diagnosis of SARS-CoV-2 by FDA under an Emergency Use Authorization (EUA).  This EUA will remain in effect (meaning this test can be used) for the duration of the COVID-19 declaration under Section 564(b)(1) of the Act, 21 U.S.C. section 360bbb-3(b)(1), unless the authorization is terminated or revoked sooner. Performed at [facility], [place] [address], [place], [place] [address]   MRSA PCR Screening     Status: None   Collection Time: 09/17/18  4:44 AM  Result Value Ref Range Status   MRSA by PCR NEGATIVE NEGATIVE Final    Comment:        The GeneXpert MRSA Assay (FDA approved for NASAL specimens only), is one component of a comprehensive MRSA colonization surveillance program. It is not intended to diagnose MRSA infection nor to guide or monitor treatment for MRSA infections. Performed at [facility], [place] [address], [place], [place] [address]          Radiology Studies: No results found.      Scheduled Meds: . amLODipine  10 mg Oral Daily  . cloNIDine  0.2 mg Oral BID  . divalproex  250 mg Oral BID  . enoxaparin (LOVENOX) injection  0.5 mg/kg Subcutaneous Q24H  . fluticasone  2 spray Each Nare Daily  . furosemide  40 mg Oral Daily  . hydrALAZINE  100 mg Oral TID  . labetalol  600 mg Oral BID  . levothyroxine  75 mcg Oral Once per day on Mon Tue Wed Thu Fri  . lisinopril  40 mg Oral Daily  . loratadine  10 mg Oral Daily  . mouth rinse  15 mL Mouth Rinse BID  . perphenazine  4 mg Oral BID  . perphenazine  8 mg Oral QHS  . potassium chloride  40 mEq Oral BID  . senna-docusate  1 tablet Oral Daily   . sodium chloride flush  3 mL Intravenous Q12H  . sodium chloride flush  3 mL Intravenous Q12H   Continuous Infusions:   LOS: 2 days        Aline August, MD [facility] 09/22/2018, 7:38 AM

## 2018-09-22 NOTE — Progress Notes (Signed)
[facility] on referral faxed for placement. Unable to leave voicemail for call back (no voicemail option) . Will continue to attempt for possible bed offer.

## 2018-09-23 LAB — BASIC METABOLIC PANEL
Anion gap: 6 (ref 5–15)
BUN: 22 mg/dL — ABNORMAL HIGH (ref 6–20)
CO2: 37 mmol/L — ABNORMAL HIGH (ref 22–32)
Calcium: 10.2 mg/dL (ref 8.9–10.3)
Chloride: 100 mmol/L (ref 98–111)
Creatinine, Ser: 0.89 mg/dL (ref 0.44–1.00)
GFR calc Af Amer: >60 mL/min (ref >60)
GFR calc non Af Amer: >60 mL/min (ref >60)
Glucose, Bld: 116 mg/dL — ABNORMAL HIGH (ref 70–99)
Potassium: 4.6 mmol/L (ref 3.5–5.1)
Sodium: 143 mmol/L (ref 135–145)

## 2018-09-23 LAB — MAGNESIUM: Magnesium: 2.3 mg/dL (ref 1.7–2.4)

## 2018-09-23 MED ORDER — FUROSEMIDE 20 MG PO TABS
20.0000 mg | ORAL_TABLET | Freq: Every day | ORAL | Status: DC
  Administered 2018-09-23 – 2018-09-25 (×3): 20 mg via ORAL
  Filled 2018-09-23 (×3): qty 1

## 2018-09-23 NOTE — Progress Notes (Signed)
Patient denied at [place] LCSW [phone]

## 2018-09-23 NOTE — Progress Notes (Signed)
Patient ID: Madison Palmer, female   DOB: 08/03/1965, 53 y.o.   MRN: [id number]  PROGRESS NOTE    Madison Palmer  LYY:[id number] DOB: 1965-11-27 DOA: 09/15/2018 PCP: Flossie Buffy, NP   Brief Narrative:  24-year-old female with history of hyperparathyroidism, hypothyroidism, OSA on CPAP, diastolic CHF last EF noted to be 60 to 65% with grade 1 diastolic dysfunction in 03/7516, chronic lymphedema and pulmonary nodules presented with hypoxia from behavioral health where she was and from 09/12/2018 for psychosis with delusions. Per review of records, patient was previously sent home with 2 L nasal cannula oxygen and was supposed to be on CPAP at night.  However, at behavioral health, she was not getting her CPAP or her oxygen.  She was found to have oxygen saturations in the 80s and hence she was sent to the ED.  She was admitted for the same.  She was mildly volume overloaded and was found to have hypertensive urgency.  Psychiatry was consulted on 09/16/2018 and recommended psychiatric hospitalization on discharge.  Assessment & Plan:   Principal Problem:   Bipolar 1 disorder ([place]) Active Problems:   Hypothyroidism   Morbid obesity ([place])   OSA on CPAP   Lymphatic edema   Chronic diastolic heart failure (HCC)   Chronic respiratory failure (HCC)   Acute on chronic respiratory failure with hypoxia and hypercapnia (HCC)  Chronic respiratory failure with hypercapnia and hypoxia Obstructive sleep apnea -Patient's noted to be hypoxic into the 80s on room air on admission.  It appears that she is supposed to be on at least 2 L of nasal cannula oxygen 24/7 from hospitalization in 05/2018. - She is also supposed to be on CPAP at night -Continue oxygen supplementation.  Currently on oxygen via nasal cannula at 1-2 L/min. -Continue CPAP at night. -Patient is still requiring supplemental oxygen via nasal cannula and is unsafe for her to be discharged home given her current psychiatric condition and  psychiatry recommendation of inpatient hospitalization.  Bipolar disorder with psychosis -Psychiatry evaluation appreciated.  Continue one-to-one sitter -Continue Depakote, Ativan and perphenazine as per psychiatry. -Patient will need inpatient psychiatric hospitalization.  She is medically stable for discharge.  Social worker following  Hypertensive urgency -Blood pressure better. -Continue monitoring.  Continue clonidine, labetalol, hydralazine and lisinopril and Lasix.   Mild acute on chronic chronic diastolic heart failure -Last EF was noted to be 60 to 65% with mild pulmonary hypertension on 03/2018 -Chest x-ray with concerning for mild pulmonary edema.  CT angiogram was negative for PE or significant edema -Strict input and output.  Daily weights.  Fluid restriction. -Decrease Lasix to 20 mg daily.  Outpatient follow-up with cardiology.  Continue hydralazine and lisinopril. -Monitor creatinine.  Chronic lymphedema -Follow-up with lymphedema clinic  Hypothyroidism -Continue levothyroxine  Pulmonary nodules -CT angiogram did note pulmonary nodules.  Followed by pulmonology.  Outpatient follow-up with Dr. Elsworth Soho  Morbid obesity -Outpatient follow-up   DVT prophylaxis: Lovenox Code Status: Full Family Communication: None at bedside  disposition Plan: Psychiatric facility once bed available.    Consultants: Psychiatry  Procedures: None  Antimicrobials: None   Subjective: Patient seen and examined at bedside.  Poor historian.  No overnight fever, vomiting or worsening shortness of breath reported by nursing staff.  Objective: Vitals:   09/22/18 1437 09/22/18 2206 09/23/18 0623 09/23/18 0706  BP: 106/63 (!) 122/52  134/67  Pulse: 66 76  67  Resp: 18 15  15   Temp:  98.6 F (37 C)  97.9  F (36.6 C)  TempSrc:  Oral  Oral  SpO2: 97% 96%  98%  Weight:   (!) 138.1 kg   Height:        Intake/Output Summary (Last 24 hours) at 09/23/2018 0746 Last data filed at  09/23/2018 7425 Gross per 24 hour  Intake 843 ml  Output 900 ml  Net -57 ml   Filed Weights   09/21/18 0612 09/22/18 0656 09/23/18 0623  Weight: (!) 138.3 kg (!) 136.1 kg (!) 138.1 kg    Examination:  General exam:  Looks older than stated age.  Morbidly obese.  Very poor historian.  Looks anxious.  Respiratory system: Bilateral decreased breath sounds at bases with some bibasilar crackles.  No wheezing cardiovascular system: S1-S2 heard, rate controlled Gastrointestinal system: Abdomen is morbidly obese, nondistended, soft and nontender. Normal bowel sounds heard. Extremities: No cyanosis; trace lower extremity edema.     Data Reviewed: I have personally reviewed following labs and imaging studies  CBC: No results for input(s): WBC, NEUTROABS, HGB, HCT, MCV, PLT in the last 168 hours. Basic Metabolic Panel: Recent Labs  Lab 09/17/18 0241 09/18/18 0342 09/20/18 0313 09/23/18 0237  NA 140 140 141 143  K 4.0 4.1 4.4 4.6  CL 95* 98 98 100  CO2 36* 34* 34* 37*  GLUCOSE 122* 130* 116* 116*  BUN 10 14 23* 22*  CREATININE 0.94 0.86 0.94 0.89  CALCIUM 10.0 10.0 10.2 10.2  MG 2.0  --  2.1 2.3   GFR: Estimated Creatinine Clearance: 101.2 mL/min (by C-G formula based on SCr of 0.89 mg/dL). Liver Function Tests: No results for input(s): AST, ALT, ALKPHOS, BILITOT, PROT, ALBUMIN in the last 168 hours. No results for input(s): LIPASE, AMYLASE in the last 168 hours. No results for input(s): AMMONIA in the last 168 hours. Coagulation Profile: No results for input(s): INR, PROTIME in the last 168 hours. Cardiac Enzymes: No results for input(s): CKTOTAL, CKMB, CKMBINDEX, TROPONINI in the last 168 hours. BNP (last 3 results) Recent Labs    02/20/18 1536  PROBNP 173.0*   HbA1C: No results for input(s): HGBA1C in the last 72 hours. CBG: Recent Labs  Lab 09/21/18 0941  GLUCAP 139*   Lipid Profile: No results for input(s): CHOL, HDL, LDLCALC, TRIG, CHOLHDL, LDLDIRECT in the  last 72 hours. Thyroid Function Tests: No results for input(s): TSH, T4TOTAL, FREET4, T3FREE, THYROIDAB in the last 72 hours. Anemia Panel: No results for input(s): VITAMINB12, FOLATE, FERRITIN, TIBC, IRON, RETICCTPCT in the last 72 hours. Sepsis Labs: No results for input(s): PROCALCITON, LATICACIDVEN in the last 168 hours.  Recent Results (from the past 240 hour(s))  SARS Coronavirus 2 (CEPHEID - Performed in [place] hospital lab), Hosp Order     Status: None   Collection Time: 09/15/18  9:18 AM  Result Value Ref Range Status   SARS Coronavirus 2 NEGATIVE NEGATIVE Final    Comment: (NOTE) If result is NEGATIVE SARS-CoV-2 target nucleic acids are NOT DETECTED. The SARS-CoV-2 RNA is generally detectable in upper and lower  respiratory specimens during the acute phase of infection. The lowest  concentration of SARS-CoV-2 viral copies this assay can detect is 250  copies / mL. A negative result does not preclude SARS-CoV-2 infection  and should not be used as the sole basis for treatment or other  patient management decisions.  A negative result may occur with  improper specimen collection / handling, submission of specimen other  than nasopharyngeal swab, presence of viral mutation(s) within the  areas targeted by this assay, and inadequate number of viral copies  (<250 copies / mL). A negative result must be combined with clinical  observations, patient history, and epidemiological information. If result is POSITIVE SARS-CoV-2 target nucleic acids are DETECTED. The SARS-CoV-2 RNA is generally detectable in upper and lower  respiratory specimens dur ing the acute phase of infection.  Positive  results are indicative of active infection with SARS-CoV-2.  Clinical  correlation with patient history and other diagnostic information is  necessary to determine patient infection status.  Positive results do  not rule out bacterial infection or co-infection with other viruses. If  result is PRESUMPTIVE POSTIVE SARS-CoV-2 nucleic acids MAY BE PRESENT.   A presumptive positive result was obtained on the submitted specimen  and confirmed on repeat testing.  While 2019 novel coronavirus  (SARS-CoV-2) nucleic acids may be present in the submitted sample  additional confirmatory testing may be necessary for epidemiological  and / or clinical management purposes  to differentiate between  SARS-CoV-2 and other Sarbecovirus currently known to infect humans.  If clinically indicated additional testing with an alternate test  methodology [phone] is advised. The SARS-CoV-2 RNA is generally  detectable in upper and lower respiratory sp ecimens during the acute  phase of infection. The expected result is Negative. Fact Sheet for Patients:  StrictlyIdeas.no Fact Sheet for Healthcare Providers: BankingDealers.co.za This test is not yet approved or cleared by the [place] FDA and has been authorized for detection and/or diagnosis of SARS-CoV-2 by FDA under an Emergency Use Authorization (EUA).  This EUA will remain in effect (meaning this test can be used) for the duration of the COVID-19 declaration under Section 564(b)(1) of the Act, 21 U.S.C. section 360bbb-3(b)(1), unless the authorization is terminated or revoked sooner. Performed at [facility], [place] [address] [place], [place] [address]   MRSA PCR Screening     Status: None   Collection Time: 09/17/18  4:44 AM  Result Value Ref Range Status   MRSA by PCR NEGATIVE NEGATIVE Final    Comment:        The GeneXpert MRSA Assay (FDA approved for NASAL specimens only), is one component of a comprehensive MRSA colonization surveillance program. It is not intended to diagnose MRSA infection nor to guide or monitor treatment for MRSA infections. Performed at [facility], [place] [address] [place], [place] [address]          Radiology Studies: No  results found.      Scheduled Meds: . amLODipine  10 mg Oral Daily  . cloNIDine  0.2 mg Oral BID  . divalproex  250 mg Oral BID  . enoxaparin (LOVENOX) injection  0.5 mg/kg Subcutaneous Q24H  . fluticasone  2 spray Each Nare Daily  . furosemide  40 mg Oral Daily  . hydrALAZINE  100 mg Oral TID  . labetalol  600 mg Oral BID  . levothyroxine  75 mcg Oral Once per day on Mon Tue Wed Thu Fri  . lisinopril  40 mg Oral Daily  . loratadine  10 mg Oral Daily  . mouth rinse  15 mL Mouth Rinse BID  . perphenazine  4 mg Oral BID  . perphenazine  8 mg Oral QHS  . potassium chloride  40 mEq Oral BID  . senna-docusate  1 tablet Oral Daily  . sodium chloride flush  3 mL Intravenous Q12H  . sodium chloride flush  3 mL Intravenous Q12H   Continuous Infusions:  LOS: 3 days        Aline August, MD [facility] 09/23/2018, 7:46 AM

## 2018-09-24 LAB — BASIC METABOLIC PANEL
Anion gap: 7 (ref 5–15)
BUN: 22 mg/dL — ABNORMAL HIGH (ref 6–20)
CO2: 35 mmol/L — ABNORMAL HIGH (ref 22–32)
Calcium: 11 mg/dL — ABNORMAL HIGH (ref 8.9–10.3)
Chloride: 99 mmol/L (ref 98–111)
Creatinine, Ser: 0.85 mg/dL (ref 0.44–1.00)
GFR calc Af Amer: >60 mL/min (ref >60)
GFR calc non Af Amer: >60 mL/min (ref >60)
Glucose, Bld: 119 mg/dL — ABNORMAL HIGH (ref 70–99)
Potassium: 4.4 mmol/L (ref 3.5–5.1)
Sodium: 141 mmol/L (ref 135–145)

## 2018-09-24 LAB — MAGNESIUM: Magnesium: 2.3 mg/dL (ref 1.7–2.4)

## 2018-09-24 MED ORDER — PERPHENAZINE 4 MG PO TABS
4.0000 mg | ORAL_TABLET | Freq: Three times a day (TID) | ORAL | Status: DC
  Administered 2018-09-24 – 2018-09-25 (×3): 4 mg via ORAL
  Filled 2018-09-24 (×5): qty 1

## 2018-09-24 MED ORDER — LORAZEPAM 1 MG PO TABS
1.0000 mg | ORAL_TABLET | Freq: Two times a day (BID) | ORAL | Status: DC | PRN
  Administered 2018-09-24 – 2018-09-25 (×2): 1 mg via ORAL
  Filled 2018-09-24 (×2): qty 1

## 2018-09-24 NOTE — Progress Notes (Addendum)
During shift assessment patient anxious (tearful, trembling) and expressed strong desire to talk to son Madison Palmer. Patient seemed very concerned that her son was aware of how she was doing and that she needed to communicate with him. Inquired if she had spoken with son Madison Palmer by phone or using video call (such as facetime). Patient stated they attempted to call using video call, however, it was unsuccessful. Informed patient will inform day team to attempt to call Madison Palmer.  PRN Ativan given with night medications given increased anxiety and restlessness. Also provided patient with essential oils.  CPAP applied during night. Per sitter patient slept from approximately 0200 to 0600.

## 2018-09-24 NOTE — Progress Notes (Signed)
Pt agreed to wear CPAP.  Placed on on CPAP with 2 lpm bled in oxygen.  Tolerating well at this time.

## 2018-09-24 NOTE — Progress Notes (Signed)
1218: Patient having increased anxiety. Pulling off medcal equipment and socks. Medication provided.   1715: Patient screaming out "help me." holding hands palm up, away from chest. Asking for  "Caryl Pina" and "Tanya." Able to calm patient with emotional support.

## 2018-09-24 NOTE — Progress Notes (Signed)
Patient ID: Madison Palmer, female   DOB: June 16, 1965, 53 y.o.   MRN: [id number]  PROGRESS NOTE    Madison Palmer  ZYS:[id number] DOB: [birth year] DOA: 09/15/2018 PCP: Flossie Buffy, NP   Brief Narrative:  29-year-old female with history of hyperparathyroidism, hypothyroidism, OSA on CPAP, diastolic CHF last EF noted to be 60 to 65% with grade 1 diastolic dysfunction in 93/2355, chronic lymphedema and pulmonary nodules presented with hypoxia from behavioral health where she was and from 09/12/2018 for psychosis with delusions. Per review of records, patient was previously sent home with 2 L nasal cannula oxygen and was supposed to be on CPAP at night.  However, at behavioral health, she was not getting her CPAP or her oxygen.  She was found to have oxygen saturations in the 80s and hence she was sent to the ED.  She was admitted for the same.  She was mildly volume overloaded and was found to have hypertensive urgency.  Psychiatry was consulted on 09/16/2018 and recommended psychiatric hospitalization on discharge.  Assessment & Plan:   Principal Problem:   Bipolar 1 disorder ([place]) Active Problems:   Hypothyroidism   Morbid obesity ([place])   OSA on CPAP   Lymphatic edema   Chronic diastolic heart failure (HCC)   Chronic respiratory failure (HCC)   Acute on chronic respiratory failure with hypoxia and hypercapnia (HCC)  Chronic respiratory failure with hypercapnia and hypoxia Obstructive sleep apnea -Patient's noted to be hypoxic into the 80s on room air on admission.  It appears that she is supposed to be on at least 2 L of nasal cannula oxygen 24/7 from hospitalization in 05/2018. - She is also supposed to be on CPAP at night -Continue oxygen supplementation.  Currently on oxygen via nasal cannula at 1-2 L/min. -Continue CPAP at night. -Patient is still requiring supplemental oxygen via nasal cannula and is unsafe for her to be discharged home given her current psychiatric condition and  psychiatry recommendation of inpatient hospitalization.  Bipolar disorder with psychosis -Psychiatry evaluation appreciated.  Continue one-to-one sitter -Continue Depakote, Ativan and perphenazine as per psychiatry. -Patient will need inpatient psychiatric hospitalization.  She is medically stable for discharge.  Social worker following.  Will request psychiatric evaluation as per social worker's request.  Hypertensive urgency -Blood pressure better. -Continue monitoring.  Continue clonidine, labetalol, hydralazine and lisinopril and Lasix.   Mild acute on chronic chronic diastolic heart failure -Last EF was noted to be 60 to 65% with mild pulmonary hypertension on 03/2018 -Chest x-ray with concerning for mild pulmonary edema.  CT angiogram was negative for PE or significant edema -Strict input and output.  Daily weights.  Fluid restriction. -Continue current dose of Lasix.  Outpatient follow-up with cardiology.  Continue hydralazine and lisinopril. -Monitor creatinine.  Chronic lymphedema -Follow-up with lymphedema clinic  Hypothyroidism -Continue levothyroxine  Pulmonary nodules -CT angiogram did note pulmonary nodules.  Followed by pulmonology.  Outpatient follow-up with Dr. Elsworth Soho  Morbid obesity -Outpatient follow-up   DVT prophylaxis: Lovenox Code Status: Full Family Communication: None at bedside  disposition Plan: Psychiatric facility once bed available.    Consultants: Psychiatry  Procedures: None  Antimicrobials: None   Subjective: Patient seen and examined at bedside.  Sleepy, wakes up only very slightly, does not answer most questions.  No overnight fever or worsening shortness of breath.   Objective: Vitals:   09/23/18 1453 09/23/18 2055 09/24/18 0456 09/24/18 0648  BP: (!) 114/54 (!) 156/73 (!) 144/71   Pulse: 73 87 73  Resp:  18 18   Temp: 97.8 F (36.6 C) 98.4 F (36.9 C) 98.4 F (36.9 C)   TempSrc: Oral Oral Oral   SpO2: 95% 97% 98%   Weight:     (!) 138.4 kg  Height:        Intake/Output Summary (Last 24 hours) at 09/24/2018 0733 Last data filed at 09/24/2018 8921 Gross per 24 hour  Intake 543 ml  Output 200 ml  Net 343 ml   Filed Weights   09/22/18 0656 09/23/18 0623 09/24/18 0648  Weight: (!) 136.1 kg (!) 138.1 kg (!) 138.4 kg    Examination:  General exam:  Looks older than stated age.  Morbidly obese.  Very poor historian. Sleepy, wakes up only very slightly, does not answer most questions.   Respiratory system: Bilateral decreased breath sounds at bases with bibasilar crackles  cardiovascular system: Rate controlled, S1-S2 heard Gastrointestinal system: Abdomen is morbidly obese, nondistended, soft and nontender. Normal bowel sounds heard. Extremities: No cyanosis; trace lower extremity edema.     Data Reviewed: I have personally reviewed following labs and imaging studies  CBC: No results for input(s): WBC, NEUTROABS, HGB, HCT, MCV, PLT in the last 168 hours. Basic Metabolic Panel: Recent Labs  Lab 09/18/18 0342 09/20/18 0313 09/23/18 0237 09/24/18 0236  NA 140 141 143 141  K 4.1 4.4 4.6 4.4  CL 98 98 100 99  CO2 34* 34* 37* 35*  GLUCOSE 130* 116* 116* 119*  BUN 14 23* 22* 22*  CREATININE 0.86 0.94 0.89 0.85  CALCIUM 10.0 10.2 10.2 11.0*  MG  --  2.1 2.3 2.3   GFR: Estimated Creatinine Clearance: 106.1 mL/min (by C-G formula based on SCr of 0.85 mg/dL). Liver Function Tests: No results for input(s): AST, ALT, ALKPHOS, BILITOT, PROT, ALBUMIN in the last 168 hours. No results for input(s): LIPASE, AMYLASE in the last 168 hours. No results for input(s): AMMONIA in the last 168 hours. Coagulation Profile: No results for input(s): INR, PROTIME in the last 168 hours. Cardiac Enzymes: No results for input(s): CKTOTAL, CKMB, CKMBINDEX, TROPONINI in the last 168 hours. BNP (last 3 results) Recent Labs    02/20/18 1536  PROBNP 173.0*   HbA1C: No results for input(s): HGBA1C in the last 72 hours.  CBG: Recent Labs  Lab 09/21/18 0941  GLUCAP 139*   Lipid Profile: No results for input(s): CHOL, HDL, LDLCALC, TRIG, CHOLHDL, LDLDIRECT in the last 72 hours. Thyroid Function Tests: No results for input(s): TSH, T4TOTAL, FREET4, T3FREE, THYROIDAB in the last 72 hours. Anemia Panel: No results for input(s): VITAMINB12, FOLATE, FERRITIN, TIBC, IRON, RETICCTPCT in the last 72 hours. Sepsis Labs: No results for input(s): PROCALCITON, LATICACIDVEN in the last 168 hours.  Recent Results (from the past 240 hour(s))  SARS Coronavirus 2 (CEPHEID - Performed in [place] hospital lab), Hosp Order     Status: None   Collection Time: 09/15/18  9:18 AM  Result Value Ref Range Status   SARS Coronavirus 2 NEGATIVE NEGATIVE Final    Comment: (NOTE) If result is NEGATIVE SARS-CoV-2 target nucleic acids are NOT DETECTED. The SARS-CoV-2 RNA is generally detectable in upper and lower  respiratory specimens during the acute phase of infection. The lowest  concentration of SARS-CoV-2 viral copies this assay can detect is 250  copies / mL. A negative result does not preclude SARS-CoV-2 infection  and should not be used as the sole basis for treatment or other  patient management decisions.  A negative result may  occur with  improper specimen collection / handling, submission of specimen other  than nasopharyngeal swab, presence of viral mutation(s) within the  areas targeted by this assay, and inadequate number of viral copies  (<250 copies / mL). A negative result must be combined with clinical  observations, patient history, and epidemiological information. If result is POSITIVE SARS-CoV-2 target nucleic acids are DETECTED. The SARS-CoV-2 RNA is generally detectable in upper and lower  respiratory specimens dur ing the acute phase of infection.  Positive  results are indicative of active infection with SARS-CoV-2.  Clinical  correlation with patient history and other diagnostic information is   necessary to determine patient infection status.  Positive results do  not rule out bacterial infection or co-infection with other viruses. If result is PRESUMPTIVE POSTIVE SARS-CoV-2 nucleic acids MAY BE PRESENT.   A presumptive positive result was obtained on the submitted specimen  and confirmed on repeat testing.  While 2019 novel coronavirus  (SARS-CoV-2) nucleic acids may be present in the submitted sample  additional confirmatory testing may be necessary for epidemiological  and / or clinical management purposes  to differentiate between  SARS-CoV-2 and other Sarbecovirus currently known to infect humans.  If clinically indicated additional testing with an alternate test  methodology [phone]) is advised. The SARS-CoV-2 RNA is generally  detectable in upper and lower respiratory sp ecimens during the acute  phase of infection. The expected result is Negative. Fact Sheet for Patients:  StrictlyIdeas.no Fact Sheet for Healthcare Providers: BankingDealers.co.za This test is not yet approved or cleared by the [place] FDA and has been authorized for detection and/or diagnosis of SARS-CoV-2 by FDA under an Emergency Use Authorization (EUA).  This EUA will remain in effect (meaning this test can be used) for the duration of the COVID-19 declaration under Section 564(b)(1) of the Act, 21 U.S.C. section 360bbb-3(b)(1), unless the authorization is terminated or revoked sooner. Performed at [facility], [place] [address], [place], [place] [address]   MRSA PCR Screening     Status: None   Collection Time: 09/17/18  4:44 AM  Result Value Ref Range Status   MRSA by PCR NEGATIVE NEGATIVE Final    Comment:        The GeneXpert MRSA Assay (FDA approved for NASAL specimens only), is one component of a comprehensive MRSA colonization surveillance program. It is not intended to diagnose MRSA infection nor to guide or monitor  treatment for MRSA infections. Performed at [facility], [place] [address], [place], [place] [address]          Radiology Studies: No results found.      Scheduled Meds: . amLODipine  10 mg Oral Daily  . cloNIDine  0.2 mg Oral BID  . divalproex  250 mg Oral BID  . enoxaparin (LOVENOX) injection  0.5 mg/kg Subcutaneous Q24H  . fluticasone  2 spray Each Nare Daily  . furosemide  20 mg Oral Daily  . hydrALAZINE  100 mg Oral TID  . labetalol  600 mg Oral BID  . levothyroxine  75 mcg Oral Once per day on Mon Tue Wed Thu Fri  . lisinopril  40 mg Oral Daily  . loratadine  10 mg Oral Daily  . mouth rinse  15 mL Mouth Rinse BID  . perphenazine  4 mg Oral BID  . perphenazine  8 mg Oral QHS  . potassium chloride  40 mEq Oral BID  . senna-docusate  1 tablet Oral Daily  . sodium  chloride flush  3 mL Intravenous Q12H  . sodium chloride flush  3 mL Intravenous Q12H   Continuous Infusions:   LOS: 4 days        Madison August, Madison Palmer [facility] 09/24/2018, 7:33 AM

## 2018-09-24 NOTE — Progress Notes (Signed)
Notified by patient sitter patient increasingly anxious around 0445. Attempted to calm and soothe patient. PRN Ativan given. Provided patient with essential oil and encouraged to take deep breaths to facilitate relaxation.

## 2018-09-24 NOTE — Consult Note (Signed)
Telepsych Consultation   Reason for Consult:  Reevaluation  Referring Physician:  Dr. Aline August  Location of Patient:  [facility] Location of Provider: [facility]  Patient Identification: Madison Palmer MRN:  [id number] Principal Diagnosis: Bipolar 1 disorder ([place]) Diagnosis:  Principal Problem:   Bipolar 1 disorder ([place]) Active Problems:   Hypothyroidism   Morbid obesity ([place])   OSA on CPAP   Lymphatic edema   Chronic diastolic heart failure (HCC)   Chronic respiratory failure (HCC)   Acute on chronic respiratory failure with hypoxia and hypercapnia ([place])   Total Time spent with patient: 1 hour  Subjective:   Madison Palmer is a 53 y.o. female patient admitted with chronic respiratory failure with hypercapnia and hypoxia.   HPI:   Per chart review, patient was admitted with chronic respiratory failure with hypercapnia and hypoxia. She was initially admitted to [facility] on 5/14 for psychosis but sent to the ED due to medical concern. Home medications include Depakote 500 mg BID (reduced to 250 mg BID since hospitalization), Trilafon 4 mg TID, Ativan 1 mg q 4 hours (reduced to 1 mg q 4 hours PRN since hospitalization) and Clonidine 0.2 mg BID. Ativan and Trilafon (and Latuda discontinued) were started during her recent hospitalization at [facility]. Patient was last seen by psychiatry on 5/18 and recommended for inpatient psychiatric hospitalization. Psychiatry is reconsulted today to determine if patient is still requiring an inpatient psychiatric hospitalization. She has been difficult to place due to need for oxygen. She is on the waitlist for [facility].   On interview, Madison Palmer reports that she is doing "all right." She reports that she has been sleeping most of the day. She denies problems with her appetite. She denies SI, HI or AVH. She denies paranoia. She reports that her thoughts are clearer. She feels like she is at her baseline and is ready for discharge home.   Past  Psychiatric History: Bipolar disorder, schizoaffective disorder and anxiety.   Risk to Self:  None. Denies SI.  Risk to Others:  None. Denies HI.  Prior Inpatient Therapy:  She was hospitalized at [facility] a few days ago for psychosis and discharged for medical concern due to low O2 sats.  Prior Outpatient Therapy:  She is followed by her outpatient provider.    Past Medical History:  Past Medical History:  Diagnosis Date  . Anemia   . Anxiety   . Arrhythmia    tachycardia  . Arthritis   . Chickenpox   . Depression   . Diverticulitis   . GERD (gastroesophageal reflux disease)   . Glaucoma   . Hyperlipidemia   . Hyperparathyroidism ([place])   . Hypertension   . Inflammatory polyps of colon ([place])   . LVH (left ventricular hypertrophy)   . Lymphedema   . PCOS (polycystic ovarian syndrome)   . Prediabetes   . Recurrent UTI   . Sleep apnea    CPAP  . Thyroid disease   . Vitamin D deficiency     Past Surgical History:  Procedure Laterality Date  . BREAST BIOPSY  2015  . CESAREAN SECTION  2004  . INNER EAR SURGERY     ear and sinus surgery  . LAPAROSCOPIC REPAIR AND REMOVAL OF GASTRIC BAND    . OOPHORECTOMY Left   . TONSILLECTOMY AND ADENOIDECTOMY     Family History:  Family History  Adopted: Yes  Problem Relation Age of Onset  . Hearing loss Son  right   . Healthy Son    Family Psychiatric  History: Denies  Social History:  Social History   Substance and Sexual Activity  Alcohol Use Yes   Comment: social     Social History   Substance and Sexual Activity  Drug Use No    Social History   Socioeconomic History  . Marital status: Married    Spouse name: Not on file  . Number of children: 1  . Years of education: Not on file  . Highest education level: Not on file  Occupational History  . Occupation: Unemployed  Social Needs  . Financial resource strain: Not on file  . Food insecurity:    Worry: Not on file    Inability: Not on file  .  Transportation needs:    Medical: Not on file    Non-medical: Not on file  Tobacco Use  . Smoking status: Never Smoker  . Smokeless tobacco: Never Used  Substance and Sexual Activity  . Alcohol use: Yes    Comment: social  . Drug use: No  . Sexual activity: Not Currently  Lifestyle  . Physical activity:    Days per week: Not on file    Minutes per session: Not on file  . Stress: Not on file  Relationships  . Social connections:    Talks on phone: Not on file    Gets together: Not on file    Attends religious service: Not on file    Active member of club or organization: Not on file    Attends meetings of clubs or organizations: Not on file    Relationship status: Not on file  Other Topics Concern  . Not on file  Social History Narrative   Lives with son and companion.     Additional Social History: She lives with her mother, father and dog, Angie Fava. She has a caregiver that helps with bathing her. She denies alcohol or illicit drug use.     Allergies:   Allergies  Allergen Reactions  . Fentanyl Hives  . Levofloxacin Hives  . Midazolam Hives  . Pollen Extract     seasonal  . Atorvastatin     Muscle pain in legs   . Doxycycline Itching and Swelling  . Hydralazine Hcl Other (See Comments)    Hypercalcemia   . Rosuvastatin     Abdominal pain    Labs:  Results for orders placed or performed during the hospital encounter of 09/15/18 (from the past 48 hour(s))  Basic metabolic panel     Status: Abnormal   Collection Time: 09/23/18  2:37 AM  Result Value Ref Range   Sodium 143 135 - 145 mmol/L   Potassium 4.6 3.5 - 5.1 mmol/L   Chloride 100 98 - 111 mmol/L   CO2 37 (H) 22 - 32 mmol/L   Glucose, Bld 116 (H) 70 - 99 mg/dL   BUN 22 (H) 6 - 20 mg/dL   Creatinine, Ser 0.89 0.44 - 1.00 mg/dL   Calcium 10.2 8.9 - 10.3 mg/dL   GFR calc non Af Amer >60 >60 mL/min   GFR calc Af Amer >60 >60 mL/min   Anion gap 6 5 - 15    Comment: Performed at [facility],  [place] [address], [place], [place] [address]  Magnesium     Status: None   Collection Time: 09/23/18  2:37 AM  Result Value Ref Range   Magnesium 2.3 1.7 - 2.4 mg/dL    Comment: Performed  at [facility], [place] [address] [place], [place] [address]  Basic metabolic panel     Status: Abnormal   Collection Time: 09/24/18  2:36 AM  Result Value Ref Range   Sodium 141 135 - 145 mmol/L   Potassium 4.4 3.5 - 5.1 mmol/L   Chloride 99 98 - 111 mmol/L   CO2 35 (H) 22 - 32 mmol/L   Glucose, Bld 119 (H) 70 - 99 mg/dL   BUN 22 (H) 6 - 20 mg/dL   Creatinine, Ser 0.85 0.44 - 1.00 mg/dL   Calcium 11.0 (H) 8.9 - 10.3 mg/dL   GFR calc non Af Amer >60 >60 mL/min   GFR calc Af Amer >60 >60 mL/min   Anion gap 7 5 - 15    Comment: Performed at [facility], [place] [address], [place], [place] [address]  Magnesium     Status: None   Collection Time: 09/24/18  2:36 AM  Result Value Ref Range   Magnesium 2.3 1.7 - 2.4 mg/dL    Comment: Performed at [place] [address] [place], [place] [address]    Medications:  Current Facility-Administered Medications  Medication Dose Route Frequency Provider Last Rate Last Dose  . acetaminophen (TYLENOL) tablet 650 mg  650 mg Oral Q6H PRN Fuller Plan A, MD       Or  . acetaminophen (TYLENOL) suppository 650 mg  650 mg Rectal Q6H PRN Smith, Rondell A, MD      . amLODipine (NORVASC) tablet 10 mg  10 mg Oral Daily Schorr, Rhetta Mura, NP   10 mg at 09/24/18 1001  . cloNIDine (CATAPRES) tablet 0.2 mg  0.2 mg Oral BID Fuller Plan A, MD   0.2 mg at 09/24/18 0825  . divalproex (DEPAKOTE) DR tablet 250 mg  250 mg Oral BID Fuller Plan A, MD   250 mg at 09/24/18 0827  . enoxaparin (LOVENOX) injection 70 mg  0.5 mg/kg Subcutaneous Q24H Pat Patrick, RPH   70 mg at 09/23/18 2157  . fluticasone (FLONASE) 50 MCG/ACT nasal spray 2 spray  2 spray Each Nare Daily Fuller Plan A, MD   2 spray at 09/24/18 0959  . furosemide (LASIX) tablet 20 mg  20 mg  Oral Daily Aline August, MD   20 mg at 09/24/18 0959  . hydrALAZINE (APRESOLINE) tablet 100 mg  100 mg Oral TID Schorr, Rhetta Mura, NP   100 mg at 09/24/18 0959  . ipratropium-albuterol (DUONEB) 0.5-2.5 (3) MG/3ML nebulizer solution 3 mL  3 mL Nebulization Q4H PRN Smith, Rondell A, MD      . labetalol (NORMODYNE) tablet 600 mg  600 mg Oral BID Schorr, Rhetta Mura, NP   600 mg at 09/24/18 0826  . levothyroxine (SYNTHROID) tablet 75 mcg  75 mcg Oral Once per day on Mon Tue Wed Thu Fri Norval Morton, MD   75 mcg at 09/24/18 0947  . lisinopril (ZESTRIL) tablet 40 mg  40 mg Oral Daily Smith, Rondell A, MD   40 mg at 09/24/18 1000  . loratadine (CLARITIN) tablet 10 mg  10 mg Oral Daily Fuller Plan A, MD   10 mg at 09/24/18 0959  . LORazepam (ATIVAN) tablet 1 mg  1 mg Oral Q4H PRN Fuller Plan A, MD   1 mg at 09/24/18 0509  . MEDLINE mouth rinse  15 mL Mouth Rinse BID Manuella Ghazi, Pratik D, DO   15 mL at 09/24/18 1001  . ondansetron (ZOFRAN) tablet  4 mg  4 mg Oral Q6H PRN Fuller Plan A, MD       Or  . ondansetron (ZOFRAN) injection 4 mg  4 mg Intravenous Q6H PRN Smith, Rondell A, MD      . perphenazine (TRILAFON) tablet 4 mg  4 mg Oral BID Manuella Ghazi, Pratik D, DO   4 mg at 09/24/18 0827  . perphenazine (TRILAFON) tablet 8 mg  8 mg Oral QHS Shah, Pratik D, DO   8 mg at 09/23/18 2154  . polyethylene glycol (MIRALAX / GLYCOLAX) packet 17 g  17 g Oral Daily PRN Alekh, Kshitiz, MD      . potassium chloride SA (K-DUR) CR tablet 40 mEq  40 mEq Oral BID Manuella Ghazi, Pratik D, DO   40 mEq at 09/24/18 0959  . senna-docusate (Senokot-S) tablet 1 tablet  1 tablet Oral Daily Aline August, MD   1 tablet at 09/24/18 1001  . sodium chloride flush (NS) 0.9 % injection 3 mL  3 mL Intravenous Q12H Smith, Rondell A, MD   3 mL at 09/21/18 2127  . sodium chloride flush (NS) 0.9 % injection 3 mL  3 mL Intravenous Q12H Smith, Rondell A, MD   3 mL at 09/24/18 1001    Musculoskeletal: Strength & Muscle Tone: No atrophy  noted. Gait & Station: UTA since patient is lying in bed. Patient leans: N/A  Psychiatric Specialty Exam: Physical Exam  Nursing note and vitals reviewed. Constitutional: She is oriented to person, place, and time. She appears well-developed and well-nourished.  HENT:  Head: Normocephalic and atraumatic.  Neck: Normal range of motion.  Respiratory: Effort normal.  Musculoskeletal: Normal range of motion.  Neurological: She is alert and oriented to person, place, and time.  Psychiatric: She has a normal mood and affect. Her speech is normal and behavior is normal. Judgment and thought content normal. Cognition and memory are normal.    Review of Systems  Cardiovascular: Negative for chest pain.  Gastrointestinal: Negative for abdominal pain, constipation, diarrhea, nausea and vomiting.  Psychiatric/Behavioral: Negative for depression, hallucinations, substance abuse and suicidal ideas. The patient does not have insomnia.   All other systems reviewed and are negative.   Blood pressure 120/69, pulse 70, temperature 98.4 F (36.9 C), temperature source Oral, resp. rate 18, height 5\' 3"  (1.6 m), weight (!) 138.4 kg, last menstrual period 05/02/2017, SpO2 99 %.Body mass index is 54.05 kg/m.  General Appearance: Fairly Groomed, middle aged, African American female, wearing a hospital gown with [place] who is lying in bed. NAD.   Eye Contact:  Good  Speech:  Clear and Coherent and Normal Rate  Volume:  Normal  Mood:  Euthymic  Affect:  Appropriate  Thought Process:  Goal Directed, Linear and Descriptions of Associations: Intact  Orientation:  Full (Time, Place, and Person)  Thought Content:  Logical  Suicidal Thoughts:  No  Homicidal Thoughts:  No  Memory:  Immediate;   Good Recent;   Good Remote;   Good  Judgement:  Fair  Insight:  Fair  Psychomotor Activity:  Normal  Concentration:  Concentration: Good and Attention Span: Good  Recall:  Good  Fund of Knowledge:  Good  Language:   Good  Akathisia:  No  Handed:  Right  AIMS (if indicated):   N/A  Assets:  Communication Skills Housing Resilience Social Support  ADL's:  Impaired  Cognition:  WNL  Sleep:   She endorses hypersomnia.    Assessment:  Madison Palmer is a 53 y.o. female  who was admitted with chronic respiratory failure with hypercapnia and hypoxia. She was initially admitted to [facility] on 5/14 for psychosis and sent to the ED due to medical concern. Her symptoms have significantly improved with medication management. She is organized in thought process. She denies SI, HI or AVH. Recommend decreasing Trilafon due to excessive sedation. She no longer warrants inpatient psychiatric hospitalization and should follow up with her outpatient provider for further medication management.   Treatment Plan Summary: -Continue Depakote 250 mg BID for mood stabilization. -Reduce Ativan 1 mg q 4 hours PRN to 1 mg BID PRN for agitation/anxiety given excessive sedation.  -ReduceTrilafon 4 mg BID and 8 mg qhs to 4 mg TID for psychosis.  -EKG reviewed and QTc 458 on 5/17. Please closely monitor when starting or increasing QTc prolonging agents.  -Patient should follow up with her outpatient provider for medication management.  -Will sign off on patient at this time. Please consult psychiatry again as needed.    Disposition: No evidence of imminent risk to self or others at present.   Patient does not meet criteria for psychiatric inpatient admission.  This service was provided via telemedicine using a 2-way, interactive audio and video technology.  Names of all persons participating in this telemedicine service and their role in this encounter. Name: Buford Dresser, DO Role: Psychiatrist   Name: Quintin Alto  Role: Patient     Faythe Dingwall, DO 09/24/2018 10:54 AM

## 2018-09-24 NOTE — Plan of Care (Signed)
  Problem: Health Behavior/Discharge Planning: Goal: Ability to manage health-related needs will improve Outcome: Not Progressing  Patient has episodes of anxiety and confusion. Problem: Coping: Goal: Level of anxiety will decrease Outcome: Not Progressing  Patient continues to have increased episodes of anxiety. Medication provided as ordered. Patient provided emotional support.

## 2018-09-24 NOTE — Evaluation (Signed)
Physical Therapy Evaluation Patient Details Name: Madison Palmer MRN: [id number] DOB: Oct 28, 1965 Today's Date: 09/24/2018   History of Present Illness  Pt is a 53 y.o. initially admitted to [facility] on 09/12/18 with psychosis, now admitted to [facility] on 09/15/18 with hypercapnia and hypoxia. Noted that pt was not receiving CPAP at during psychiatric hospitalization. PMH includes bipolar disorder, schizoaffective disorder, anxiety, OSA on CPAP, CHF.    Clinical Impression  Pt presents with an overall decrease in functional mobility secondary to above. Pt unreliable historian. Reports ambulatory with RW, lives alone but has daily aide [name]) who assists with ADLs and household tasks. Today, pt ambulatory with RW and intermittent min guard for balance. Pt interested to use RW at home. Intermittently emotional during session, but able to be redirected. SpO2 86-88% on RA with mobility (RN notified). Pt would benefit from continued acute PT services to maximize functional mobility and independence prior to d/c home.     Follow Up Recommendations No PT follow up;Supervision/Assistance - 24 hour    Equipment Recommendations  Rolling walker with 5" wheels    Recommendations for Other Services       Precautions / Restrictions Precautions Precautions: Fall Restrictions Weight Bearing Restrictions: No      Mobility  Bed Mobility Overal bed mobility: Modified Independent             General bed mobility comments: HOB slightly elevated  Transfers Overall transfer level: Needs assistance Equipment used: Rolling walker (2 wheeled) Transfers: Sit to/from Stand Sit to Stand: Supervision         General transfer comment: Reliant on momentum to power into standing; no physical assist required  Ambulation/Gait Ambulation/Gait assistance: Supervision;Min guard Gait Distance (Feet): 15 Feet Assistive device: Rolling walker (2 wheeled) Gait Pattern/deviations: Step-through pattern;Decreased  stride length;Wide base of support;Trunk flexed Gait velocity: Decreased Gait velocity interpretation: <1.8 ft/sec, indicate of risk for recurrent falls General Gait Details: Pt reports normally using SPC but interested to try RW in room. Slow gait with intermittent min guard for safety; pt turning RW requiring cues to keep LLE within RW frame  Stairs            Wheelchair Mobility    Modified Rankin (Stroke Patients Only)       Balance Overall balance assessment: Needs assistance   Sitting balance-Leahy Scale: Good Sitting balance - Comments: Indep to reach socks sitting EOB     Standing balance-Leahy Scale: Fair Standing balance comment: Can static stand without UE support                             Pertinent Vitals/Pain Pain Assessment: No/denies pain    Home Living Family/patient expects to be discharged to:: Private residence Living Arrangements: Alone Available Help at Discharge: Personal care attendant;Available 24 hours/day Type of Home: House Home Access: Stairs to enter   [employer] of Steps: threshold Home Layout: One level Home Equipment: Cane - single point Additional Comments: Pt told this PT that she lives alone and has caregiver [name]) assist daily. Per psychiatry note, pt lives with parents and dog.     Prior Function Level of Independence: Needs assistance   Gait / Transfers Assistance Needed: Ambulatory with SPC. Pt reports she drives. Unsure if reliable historian  ADL's / Homemaking Assistance Needed: Reports PCA assists with bathing and household tasks; "he helps me stay focused"        Hand Dominance  Extremity/Trunk Assessment   Upper Extremity Assessment Upper Extremity Assessment: Overall WFL for tasks assessed    Lower Extremity Assessment Lower Extremity Assessment: Generalized weakness       Communication   Communication: No difficulties  Cognition Arousal/Alertness:  Awake/alert Behavior During Therapy: Anxious Overall Cognitive Status: History of cognitive impairments - at baseline                                 General Comments: Pt following commands and answering questions appropriately, although unreliable historian. Pt initially stating no kids, then later yelling a name and said it was her son. Intermittently becoming tearful, but able to be redirected       General Comments General comments (skin integrity, edema, etc.): SpO2 86-88% on RA, RN notified and 1L O2 Micro replaced    Exercises     Assessment/Plan    PT Assessment Patient needs continued PT services  PT Problem List Decreased strength;Decreased activity tolerance;Decreased balance;Decreased mobility;Decreased knowledge of use of DME;Decreased cognition       PT Treatment Interventions DME instruction;Gait training;Stair training;Functional mobility training;Therapeutic activities;Therapeutic exercise;Balance training;Patient/family education    PT Goals (Current goals can be found in the Care Plan section)  Acute Rehab PT Goals Patient Stated Goal: Go back home and see my son PT Goal Formulation: With patient Time For Goal Achievement: 10/08/18 Potential to Achieve Goals: Good    Frequency Min 3X/week   Barriers to discharge        Co-evaluation               AM-PAC PT "6 Clicks" Mobility  Outcome Measure Help needed turning from your back to your side while in a flat bed without using bedrails?: None Help needed moving from lying on your back to sitting on the side of a flat bed without using bedrails?: None Help needed moving to and from a bed to a chair (including a wheelchair)?: A Little Help needed standing up from a chair using your arms (e.g., wheelchair or bedside chair)?: A Little Help needed to walk in hospital room?: A Little Help needed climbing 3-5 steps with a railing? : A Little 6 Click Score: 20    End of Session   Activity  Tolerance: Patient tolerated treatment well Patient left: in chair;with call bell/phone within reach;with nursing/sitter in room Nurse Communication: Mobility status PT Visit Diagnosis: Other abnormalities of gait and mobility (R26.89)    Time: 3536-1443 PT Time Calculation (min) (ACUTE ONLY): 17 min   Charges:   PT Evaluation $PT Eval Moderate Complexity: [place], PT, DPT Acute Rehabilitation Services  Pager [phone] Office Rew 09/24/2018, 5:44 PM

## 2018-09-24 NOTE — Progress Notes (Signed)
CSW staffed case with CW AD. Since no psych facilities will accept patient on O2 besides [facility] (long waitlist), AD suggests a psych re-consult and to gather family collateral to obtain patient's baseline.   Percell Locus Daviel Allegretto LCSW [phone]

## 2018-09-25 DIAGNOSIS — Z6841 Body Mass Index (BMI) 40.0 and over, adult: Secondary | ICD-10-CM

## 2018-09-25 LAB — CBC WITH DIFFERENTIAL/PLATELET
Abs Immature Granulocytes: 0.04 10*3/uL (ref 0.00–0.07)
Basophils Absolute: 0 10*3/uL (ref 0.0–0.1)
Basophils Relative: 1 %
Eosinophils Absolute: 0.1 10*3/uL (ref 0.0–0.5)
Eosinophils Relative: 1 %
HCT: 40.4 % (ref 36.0–46.0)
Hemoglobin: 12.7 g/dL (ref 12.0–15.0)
Immature Granulocytes: 1 %
Lymphocytes Relative: 20 %
Lymphs Abs: 1.1 10*3/uL (ref 0.7–4.0)
MCH: 29.7 pg (ref 26.0–34.0)
MCHC: 31.4 g/dL (ref 30.0–36.0)
MCV: 94.6 fL (ref 80.0–100.0)
Monocytes Absolute: 0.6 10*3/uL (ref 0.1–1.0)
Monocytes Relative: 12 %
Neutro Abs: 3.5 10*3/uL (ref 1.7–7.7)
Neutrophils Relative %: 65 %
Platelets: 229 10*3/uL (ref 150–400)
RBC: 4.27 MIL/uL (ref 3.87–5.11)
RDW: 14.2 % (ref 11.5–15.5)
WBC: 5.4 10*3/uL (ref 4.0–10.5)
nRBC: 0 % (ref 0.0–0.2)

## 2018-09-25 LAB — BASIC METABOLIC PANEL
Anion gap: 6 (ref 5–15)
BUN: 21 mg/dL — ABNORMAL HIGH (ref 6–20)
CO2: 35 mmol/L — ABNORMAL HIGH (ref 22–32)
Calcium: 10.6 mg/dL — ABNORMAL HIGH (ref 8.9–10.3)
Chloride: 101 mmol/L (ref 98–111)
Creatinine, Ser: 0.77 mg/dL (ref 0.44–1.00)
GFR calc Af Amer: >60 mL/min (ref >60)
GFR calc non Af Amer: >60 mL/min (ref >60)
Glucose, Bld: 122 mg/dL — ABNORMAL HIGH (ref 70–99)
Potassium: 4.3 mmol/L (ref 3.5–5.1)
Sodium: 142 mmol/L (ref 135–145)

## 2018-09-25 LAB — MAGNESIUM: Magnesium: 2.1 mg/dL (ref 1.7–2.4)

## 2018-09-25 MED ORDER — PERPHENAZINE 4 MG PO TABS: 4.0000 mg | ORAL_TABLET | Freq: Three times a day (TID) | ORAL | 0 refills | Status: DC

## 2018-09-25 MED ORDER — LIVING BETTER WITH HEART FAILURE BOOK: Freq: Once | Status: DC

## 2018-09-25 MED ORDER — FUROSEMIDE 40 MG PO TABS: 40.0000 mg | ORAL_TABLET | Freq: Every day | ORAL | Status: DC

## 2018-09-25 NOTE — Discharge Summary (Signed)
Physician Discharge Summary  Madison Palmer PXT:[id number] DOB: 04/11/1966 DOA: 09/15/2018  PCP: Flossie Buffy, NP  Admit date: 09/15/2018 Discharge date: 09/25/2018  Time spent: 35 minutes  Recommendations for Outpatient Follow-up:  1. Repeat basic metabolic panel to follow electrolytes and renal function 2. Close follow-up with psychiatry service. 3. Continue use of 2 L oxygen supplementation as previously instructed (especially on exertion); reassess oxygen needs. 4. Continue CPAP QHS. 5. Resume treatment with lymphedema clinic.  6. Reassess blood pressure at follow-up visit and adjust antihypertensive regimen as needed.   Discharge Diagnoses:  Principal Problem:   Bipolar 1 disorder ([place]) Active Problems:   Hypothyroidism   Morbid obesity ([place])   OSA on CPAP   Lymphatic edema   Chronic diastolic heart failure (HCC)   Chronic respiratory failure (HCC)   Acute on chronic respiratory failure with hypoxia and hypercapnia ([place])   Discharge Condition: Stable and improved.  Patient cleared by psychiatry service to be discharged home with instruction to follow-up with PCP and with outpatient psychiatry service as an outpatient.  Diet recommendation: Heart healthy/low calorie diet.  Filed Weights   09/23/18 0623 09/24/18 0648 09/25/18 0333  Weight: (!) 138.1 kg (!) 138.4 kg (!) 139.3 kg    History of present illness:  As per H&P written by Dr. Tamala Julian on 09/15/2018 53 y.o. female with medical history significant of hyperparathyroidism, hypothyroidism, OSA, on CPAP, diastolic CHF last EF noted to be 6065% with grade 1 diastolic dysfunction in 62/7035, chronic lymphedema, and pulmonary nodules; who presents with hypoxia and was currently under IVC to behavioral health for psychosis with delusions since 5/14.  Per review of records notes previously sent home with 2 L of nasal cannula oxygen and supposed to be on CPAP at night.  However, since being at behavioral health she had not  been receiving her CPAP and had not been on oxygen.  At behavioral health O2 saturations were noted to be 80% this morning on room air that she was sent to the emergency department for further evaluation.  Patient notes some lower leg swelling is slightly worse than normal.  Denies feeling short of breath at this time or having any other significant complaints at this time.  ED Course: On admission to the emergency department patient was noted to be afebrile, pulse anywhere from 77-1 20, blood pressures elevated up to 227/177.  Patient O2 saturations noted as low as 82% on room air, and maintained greater than 92% on 2 L nasal cannula oxygen.  Labs revealed CO2 33, creatinine 1.12, d-dimer 0.57, troponin 0.03, BNP 147.2.  COVID-19 testing negative.  ABG revealed pH 7.359, PCO2 64.9, and PO2 195 on 2 L.  CT angiogram of the chest was obtained but negative for any signs of a pulmonary embolus and did note multiple pulmonary nodules.  Patient was involuntary committed by ED physician. TRH called to admit for concern for hypoxia and possible congestive heart failure exacerbation.  Hospital Course:  1-bipolar disorder with psychosis -Psychiatry evaluation appreciated -Patient has been cleared to be discharged home with outpatient follow-up -Continue the use of perphenazine, Depakote, and lurasidone -currently oriented X3 and denying hallucinations.  2-hypertensive urgency -In the setting of medication noncompliance prior to admission -Low-sodium diet has been instructed -Resume the use of clonidine, labetalol, hydralazine, lisinopril and Lasix. -Reassess blood pressure at follow-up visit and further adjust regimen as needed.  3-acute on chronic respiratory failure -Multifactorial: In the setting of obesity hypoventilation syndrome, mild CHF exacerbation and obstructive sleep  apnea. -Patient was in need of oxygen supplementation since prior admission (January 2020); she was no using oxygen prior to  admission and on room air was found without oxygen supplementation in the low to mid 80s. -2 L nasal cannula has been provided 24/7 (and encouraged to be using a spacer and expiration). -Patient was also instructed to be compliant with the use of CPAP nightly.  3-acute on chronic diastolic heart failure -Ejection fraction 60 to 65% with mild pulmonary hypertension (last echo in November 2019). -Treated with IV Lasix -Condition improved, stabilize back to baseline. -Unable to properly assess volume status given morbid obesity. -Patient educated about the importance of low-sodium diet, daily weights and medication compliance. -No requiring oxygen at rest and expressing no orthopnea symptoms at discharge.  4-chronic lymphedema -Continue outpatient follow-up with lymphedema clinic -No open wounds appreciated on physical exam.  5-hypothyroidism -Continue Synthroid  6-pulmonary nodules -Continue outpatient follow-up -patient established and seen Dr. Elsworth Soho for that.  7-morbid obesity -Body mass index is 54.4 kg/m. -Low calorie diet, portion control and increase physical activity has been discussed with patient.  Procedures:  See below for x-ray reports.  Consultations:  Psychiatry service  Discharge Exam: Vitals:   09/25/18 0603 09/25/18 1006  BP: 137/74 (!) 143/62  Pulse: 64 85  Resp: 18   Temp: 98.1 F (36.7 C)   SpO2: 99% 95%    General: Morbidly obese.  In no acute distress.  Oriented x3 and reports and able to answer questions appropriately. No SI or hallucinations at this time. Cardiovascular: S1 and S2, no rubs, no gallops.  Unable to assess JVD due to body habitus. Respiratory: Fair movement bilaterally, no wheezing, no crackles.  No using accessory muscles. Abdomen: Obese, soft, nontender, nondistended, positive bowel sounds. Extremities: No cyanosis, no open wounds.  Chronic lymphedema appreciated bilaterally unchanged from baseline (according to patient  reports).Marland Kitchen  Discharge Instructions   Discharge Instructions    Diet - low sodium heart healthy   Complete by:  As directed    Discharge instructions   Complete by:  As directed    Take medications as prescribed Maintain adequate hydration Follow low-sodium/low calorie diet Outpatient follow-up with psychiatry service in 2 weeks. Follow-up with PCP in 10 days   Increase activity slowly   Complete by:  As directed      Allergies as of 09/25/2018      Reactions   Fentanyl Hives   Levofloxacin Hives   Midazolam Hives   Pollen Extract    seasonal   Atorvastatin    Muscle pain in legs    Doxycycline Itching, Swelling   Hydralazine Hcl Other (See Comments)   Hypercalcemia    Rosuvastatin    Abdominal pain      Medication List    STOP taking these medications   hydrochlorothiazide 50 MG tablet Commonly known as:  HYDRODIURIL   predniSONE 5 MG tablet Commonly known as:  DELTASONE     TAKE these medications   acetaminophen 500 MG tablet Commonly known as:  TYLENOL Take 1,000 mg by mouth 3 (three) times daily as needed for moderate pain or headache.   amLODipine 10 MG tablet Commonly known as:  NORVASC Take 10 mg by mouth daily.   benzonatate 100 MG capsule Commonly known as:  TESSALON Take 1 capsule (100 mg total) by mouth 3 (three) times daily as needed for cough.   cetirizine 10 MG tablet Commonly known as:  ZyrTEC Allergy Take 1 tablet (10 mg total) by  mouth daily.   cloNIDine 0.2 MG tablet Commonly known as:  CATAPRES Take 0.2 mg by mouth 2 (two) times a day.   divalproex 500 MG DR tablet Commonly known as:  DEPAKOTE Take 500 mg by mouth 2 (two) times a day.   esomeprazole 40 MG capsule Commonly known as:  NEXIUM Take 1 capsule (40 mg total) by mouth 2 (two) times daily.   fluticasone 50 MCG/ACT nasal spray Commonly known as:  FLONASE Place 2 sprays into both nostrils daily.   furosemide 40 MG tablet Commonly known as:  LASIX Take 1 tablet (40  mg total) by mouth daily. What changed:  when to take this   hydrALAZINE 100 MG tablet Commonly known as:  APRESOLINE Take 100 mg by mouth 3 (three) times daily.   ibuprofen 200 MG tablet Commonly known as:  ADVIL Take 400 mg by mouth every 6 (six) hours as needed for moderate pain.   labetalol 300 MG tablet Commonly known as:  NORMODYNE Take 600 mg by mouth 2 (two) times a day.   levothyroxine 75 MCG tablet Commonly known as:  SYNTHROID Take 75 mcg by mouth as directed. 5 days weekly   lisinopril 40 MG tablet Commonly known as:  ZESTRIL Take 40 mg by mouth daily.   Lurasidone HCl 60 MG Tabs Take 60 mg by mouth daily after supper.   metFORMIN 500 MG tablet Commonly known as:  GLUCOPHAGE Take 500 mg by mouth 2 (two) times daily with a meal.   perphenazine 4 MG tablet Commonly known as:  TRILAFON Take 1 tablet (4 mg total) by mouth 3 (three) times daily.   potassium chloride SA 20 MEQ tablet Commonly known as:  K-DUR Take 2 tablets (40 mEq total) by mouth daily.   Vitamin D (Ergocalciferol) 1.25 MG (50000 UT) Caps capsule Commonly known as:  DRISDOL TAKE 1 CAPSULE BY MOUTH EVERY 7 DAYS      Allergies  Allergen Reactions  . Fentanyl Hives  . Levofloxacin Hives  . Midazolam Hives  . Pollen Extract     seasonal  . Atorvastatin     Muscle pain in legs   . Doxycycline Itching and Swelling  . Hydralazine Hcl Other (See Comments)    Hypercalcemia   . Rosuvastatin     Abdominal pain    The results of significant diagnostics from this hospitalization (including imaging, microbiology, ancillary and laboratory) are listed below for reference.    Significant Diagnostic Studies: Ct Angio Chest Pe W And/or Wo Contrast  Result Date: 09/15/2018 CLINICAL DATA:  PE suspected, intermediate probability, positive D-dimer. EXAM: CT ANGIOGRAPHY CHEST WITH CONTRAST TECHNIQUE: Multidetector CT imaging of the chest was performed using the standard protocol during bolus  administration of intravenous contrast. Multiplanar CT image reconstructions and MIPs were obtained to evaluate the vascular anatomy. CONTRAST:  124mL OMNIPAQUE IOHEXOL 350 MG/ML SOLN COMPARISON:  One-view chest x-ray 09/15/2018 FINDINGS: Cardiovascular: The heart is enlarged. Atherosclerotic calcifications are present in the aortic arch. Arch and great vessel origins are otherwise normal. There is no aneurysm or focal stenosis. Pulmonary artery opacification is satisfactory. No focal filling defects are present to suggest pulmonary embolus. Pulmonary artery size is within normal limits. Coronary artery calcifications are present. Mediastinum/Nodes: No significant mediastinal, hilar, or axillary adenopathy is present. 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 Esophagus is unremarkable. Lungs/Pleura: Innumerable bilateral pulmonary nodules are again seen. Nodules are similar in size to the prior exam. In the absence treatment, this makes neoplasm less likely. This may be  related to chronic inflammation. No significant pleural effusion is present. Upper Abdomen: There is diffuse fatty infiltration liver. No discrete lesions are present. Limited imaging of the abdomen is otherwise unremarkable. Musculoskeletal: Degenerative changes of thoracic spine are stable. No focal lytic or blastic lesions are present. Ribs are unremarkable. Review of the MIP images confirms the above findings. IMPRESSION: 1. No pulmonary embolus. 2. No acute abnormality. 3. Stable appearance of multiple bilateral pulmonary nodules. The absence of interval treatment, neoplasm is less likely. Electronically Signed   By: San Morelle M.D.   On: 09/15/2018 11:56   Dg Chest Portable 1 View  Result Date: 09/15/2018 CLINICAL DATA:  Hypoxia. EXAM: PORTABLE CHEST 1 VIEW COMPARISON:  One-view chest x-ray 08/20/2018 FINDINGS: The heart is enlarged. This is exaggerated by low lung volumes. Mild interstitial edema is present. No definite effusions are  present. The visualized soft tissues and bony thorax are unremarkable. IMPRESSION: 1. Cardiomegaly and edema suggesting congestive heart failure. Electronically Signed   By: San Morelle M.D.   On: 09/15/2018 09:19   Microbiology: Recent Results (from the past 240 hour(s))  MRSA PCR Screening     Status: None   Collection Time: 09/17/18  4:44 AM  Result Value Ref Range Status   MRSA by PCR NEGATIVE NEGATIVE Final    Comment:        The GeneXpert MRSA Assay (FDA approved for NASAL specimens only), is one component of a comprehensive MRSA colonization surveillance program. It is not intended to diagnose MRSA infection nor to guide or monitor treatment for MRSA infections. Performed at [facility], [place] [place], [place] [address]      Labs: Basic Metabolic Panel: Recent Labs  Lab 09/20/18 0313 09/23/18 0237 09/24/18 0236 09/25/18 0313  NA 141 143 141 142  K 4.4 4.6 4.4 4.3  CL 98 100 99 101  CO2 34* 37* 35* 35*  GLUCOSE 116* 116* 119* 122*  BUN 23* 22* 22* 21*  CREATININE 0.94 0.89 0.85 0.77  CALCIUM 10.2 10.2 11.0* 10.6*  MG 2.1 2.3 2.3 2.1   CBC: Recent Labs  Lab 09/25/18 0313  WBC 5.4  NEUTROABS 3.5  HGB 12.7  HCT 40.4  MCV 94.6  PLT 229   BNP (last 3 results) Recent Labs    05/10/18 2120 09/15/18 0918  BNP 199.8* 147.2*    ProBNP (last 3 results) Recent Labs    02/20/18 1536  PROBNP 173.0*    CBG: Recent Labs  Lab 09/21/18 0941  GLUCAP 139*    Signed:  Barton Dubois MD.  [facility] 09/25/2018, 10:44 AM

## 2018-09-25 NOTE — TOC Progression Note (Addendum)
Transition of Care [facility]) - Progression Note    Patient Details  Name: Madison Palmer MRN: [id number] Date of Birth: [birth year]  Transition [facility] CM/SW Contact  Sharin Mons, RN Phone Number: 09/25/2018, 12:34 PM  Clinical Narrative:    NCM @ bedside with pt requesting NCM to speak with sister Kenney Houseman [phone]). NCM called Tonya and placed on speaker phone to discussed discharge plan. Pt give NCM ok to speak with friend Verner Chol to share d/c plan. Pt states [name] ( friend) is a NP. States [name]  will provide 24/7 supervision and assistance once d/c.  NCM, along with bedside nurse Earlie Server called [name] [phone]) and placed call on speaker phone to confirm and discuss d/c plan.  [name] stated he's pt friend, and he will care for pt once d/c with 24/7 supervision and assistance. [name] also stated he will provide transportation to home. Nurse Earlie Server made [name] aware she will call him to discuss discharge instructions prior to pt pick. Whitman Hero RN,BSN,CM    5/27 @ 1520  Information provided to pt and caregiver for outpatient psych  f/u. [name] (caregiver) stated will schedule f/u  psych appointment for pt.    Hospital f/u scheduled : [facility] [place] [place]    Next Steps: Go on 09/27/2018    Instructions: 11 am, vitral visit    NCM made pt/ caregiver aware.  Expected Discharge Plan and Services           Expected Discharge Date: 09/25/18                                     Social Determinants of Health (SDOH) Interventions    Readmission Risk Interventions No flowsheet data found.

## 2018-09-25 NOTE — Progress Notes (Signed)
Patient Daughter Ms Allene Pyo called wanting to speak with the Doctor. MD paged with contact information.

## 2018-09-25 NOTE — Progress Notes (Signed)
Nsg Discharge Note  Admit Date:  09/15/2018 Discharge date: 09/25/2018   [name] to be D/C'd  per MD order.  AVS completed.  Patient/caregiver able to verbalize understanding.  Discharge Medication:   Discharge Assessment: Vitals:   09/25/18 1006 09/25/18 1357  BP: (!) 143/62 123/69  Pulse: 85 73  Resp:  18  Temp:  98.7 F (37.1 C)  SpO2: 95% 91%   Skin clean, dry and intact without evidence of skin break down, no evidence of skin tears noted. IV catheter discontinued intact. Site without signs and symptoms of complications - no redness or edema noted at insertion site, patient denies c/o pain - only slight tenderness at site.  Dressing with slight pressure applied.  D/c Instructions-Education: Discharge instructions given to patient/family with verbalized understanding. D/c education completed with patient/family including follow up instructions, medication list, d/c activities limitations if indicated, with other d/c instructions as indicated by MD - patient able to verbalize understanding, all questions fully answered. Patient instructed to return to ED, call 911, or call MD for any changes in condition.  Patient escorted via [place], and D/C home via private auto.  Tradarius Reinwald, Jolene Schimke, RN 09/25/2018 3:16 PM

## 2018-09-27 ENCOUNTER — Ambulatory Visit (INDEPENDENT_AMBULATORY_CARE_PROVIDER_SITE_OTHER): Payer: BLUE CROSS/BLUE SHIELD | Admitting: Nurse Practitioner

## 2018-09-27 ENCOUNTER — Encounter: Payer: Self-pay | Admitting: Nurse Practitioner

## 2018-09-27 VITALS — BP 162/93 | HR 90 | Ht 63.0 in

## 2018-09-27 DIAGNOSIS — F312 Bipolar disorder, current episode manic severe with psychotic features: Secondary | ICD-10-CM

## 2018-09-27 DIAGNOSIS — I10 Essential (primary) hypertension: Secondary | ICD-10-CM

## 2018-09-27 DIAGNOSIS — I5032 Chronic diastolic (congestive) heart failure: Secondary | ICD-10-CM

## 2018-09-27 NOTE — Progress Notes (Signed)
Virtual Visit via Video Note  I connected with Madison Palmer on 09/27/2018 at 11:00 AM EDT by a video enabled telemedicine application and verified that I am speaking with the correct person using two identifiers.  Location: Patient: Home Provider: Office   Friend/caregiver- Madison Palmer present during video call.  I discussed the limitations of evaluation and management by telemedicine and the availability of in person appointments. The patient expressed understanding and agreed to proceed.  History of Present Illness: Madison Palmer was admitted twice for acute psychosis. Her last hospital stay was complicated by hypoxia and elevated BP. She was discharged home with friend who is also her caregiver: Madison Palmer. Madison Palmer reports intermittent emotional outburst, but still compliant with medications. He also states she does not sleep more than 4hrs, but it is not new.  She has not scheduled f/up appts with psychiatry, nephrology, nor endocrinology.  Madison Palmer states she is not pleased about taking many BP medications.She does not think cardiologist is helping her, but adding more medications. She thinks if she has parathyroid surgery, her BP will improve. Surgery was put on hold due to hypoxia and covid pandemic. She Will like for her 79yrs old son to return to [facility]. She states she was sleep deprived as a result of trying to help her son with school work. States she is compliant with CPAP machine and nasal cannula only at bedtime.   BP Readings from Last 3 Encounters:  09/27/18 (!) 162/93  09/25/18 123/69  09/12/18 (!) 192/90   Observations/Objective: Physical Exam  Constitutional: She is oriented to person, place, and time. No distress.  Pulmonary/Chest: Effort normal.  Neurological: She is alert and oriented to person, place, and time.  Psychiatric: Her speech is normal. Thought content is delusional. Cognition and memory are normal. She expresses impulsivity. She expresses no homicidal and  no suicidal ideation.  Vitals reviewed.  Assessment and Plan: Madison Palmer was seen today for hospitalization follow-up.  Diagnoses and all orders for this visit:  Essential hypertension  Chronic diastolic heart failure (HCC)  Bipolar I disorder, current or most recent episode manic, with psychotic features ([place])   Follow Up Instructions: I strongly recommended for her to continue current medication in order to above any additional complications from uncontrolled HTN. Provider phone numbers to schedule appt with psychiatry, nephrology and endocrinology. She is also to schedule with cardiology. Maintain current medications, daily BP and o2 sat checks. F/up with me in 20months   I discussed the assessment and treatment plan with the patient. The patient was provided an opportunity to ask questions and all were answered. The patient agreed with the plan and demonstrated an understanding of the instructions.   The patient was advised to call back or seek an in-person evaluation if the symptoms worsen or if the condition fails to improve as anticipated.   Wilfred Lacy, NP

## 2018-09-30 ENCOUNTER — Other Ambulatory Visit: Payer: BLUE CROSS/BLUE SHIELD

## 2018-10-07 ENCOUNTER — Other Ambulatory Visit (INDEPENDENT_AMBULATORY_CARE_PROVIDER_SITE_OTHER): Payer: Self-pay | Admitting: Family Medicine

## 2018-10-07 ENCOUNTER — Telehealth: Payer: Self-pay | Admitting: Nurse Practitioner

## 2018-10-07 DIAGNOSIS — F319 Bipolar disorder, unspecified: Secondary | ICD-10-CM

## 2018-10-07 DIAGNOSIS — F309 Manic episode, unspecified: Secondary | ICD-10-CM

## 2018-10-07 DIAGNOSIS — I10 Essential (primary) hypertension: Secondary | ICD-10-CM

## 2018-10-07 NOTE — Telephone Encounter (Signed)
Copied from [place] [id number]. Topic: Quick Communication - Rx Refill/Question >> Oct 07, 2018 12:28 PM Celene Kras A wrote: Medication: Latuda  Has the patient contacted their pharmacy? No. Pts close friend, Verner Chol, called and is requesting to have this medication filled as the provider who prescribed it is currently out of the country. Please advise. Callback # [phone] (Agent: If no, request that the patient contact the pharmacy for the refill.) (Agent: If yes, when and what did the pharmacy advise?)  Preferred Pharmacy (with phone number or street name): [facility] [place], [place] AT [address] [place] [address] Phone: [phone] Fax: [phone] Not a 24 hour pharmacy; exact hours not known.    Agent: Please be advised that RX refills may take up to 3 business days. We ask that you follow-up with your pharmacy.

## 2018-10-08 ENCOUNTER — Telehealth: Payer: Self-pay | Admitting: Pulmonary Disease

## 2018-10-08 MED ORDER — LURASIDONE HCL 60 MG PO TABS: 60.0000 mg | ORAL_TABLET | Freq: Every day | ORAL | 0 refills | Status: DC

## 2018-10-08 NOTE — Telephone Encounter (Signed)
Medication refill needs to come from psychiatry. Who is her psychiatrist? If provider is out of office, there should be another provider to refill medications. When was her last appt with psychiatry?

## 2018-10-08 NOTE — Telephone Encounter (Signed)
I have cancelled this order and pt has appt with [facility] 01/15/19

## 2018-10-08 NOTE — Telephone Encounter (Signed)
LVM for [name] to call back, need to know [place] questions below.

## 2018-10-08 NOTE — Telephone Encounter (Signed)
During last video appt, I instructed [name] and Mr. Madison Palmer to contact psychiatry for an appt ASAP. I also provided them with the number for [facility]. If they lost number please give that number again. I will send 15tabs of latuda. Further refills need to come from psychiatry

## 2018-10-08 NOTE — Telephone Encounter (Signed)
Madison Palmer is aware rx sent. Pt has an appt with psychiatry on 10/17/2018.

## 2018-10-08 NOTE — Telephone Encounter (Signed)
LVM for [name] to call back, need to inform [name] message below. Pt can call [facility] at [phone] or [phone] to make an appt (if the pt lost the number). [place] for triage to give detail message if call back after hour.

## 2018-10-08 NOTE — Telephone Encounter (Signed)
Please advise 

## 2018-10-08 NOTE — Telephone Encounter (Signed)
Noted. Nothing further needed. 

## 2018-10-08 NOTE — Telephone Encounter (Signed)
Spoke with [name], he stated that Dr. Jacqulyn Ducking was the only doctor pt saw so far problem, since him and his partner is out of the county, hospital policy stating pt needs to go back to PCP to address this med. Pt saw Dr. Jacqulyn Ducking from from hospital visit back I 07/2018. Pt is out of this med.

## 2018-10-08 NOTE — Telephone Encounter (Signed)
-----   Message from Harland German sent at 10/08/2018  8:59 AM EDT ----- Regarding: Ct [facility] Morning Dr Elsworth Soho, You ordered a CT chest WO contrast in Jan to be scheduled in April it of course was rescheduled due to Covid 19 then the patient had a CT angio in May should we get this pt rescheduled or can I just cancel this order since we did a Ct angio. Thanks Chantel

## 2018-10-08 NOTE — Telephone Encounter (Signed)
Okay to cancel CT. She needs follow-up office visit with APP

## 2018-10-09 ENCOUNTER — Other Ambulatory Visit: Payer: Self-pay | Admitting: Nurse Practitioner

## 2018-10-09 DIAGNOSIS — I10 Essential (primary) hypertension: Secondary | ICD-10-CM

## 2018-10-09 NOTE — Telephone Encounter (Signed)
It looks like amlodipine was discontinued by cardiology, then resumed by hospitalist. I sent 30tabs. She needs to schedule appt with cardiology fur further refills.

## 2018-10-09 NOTE — Telephone Encounter (Signed)
Madison Palmer is aware.

## 2018-10-11 ENCOUNTER — Telehealth: Payer: Self-pay | Admitting: Nurse Practitioner

## 2018-10-11 ENCOUNTER — Other Ambulatory Visit: Payer: Self-pay | Admitting: Nurse Practitioner

## 2018-10-11 DIAGNOSIS — I10 Essential (primary) hypertension: Secondary | ICD-10-CM

## 2018-10-11 DIAGNOSIS — F319 Bipolar disorder, unspecified: Secondary | ICD-10-CM

## 2018-10-11 DIAGNOSIS — I160 Hypertensive urgency: Secondary | ICD-10-CM

## 2018-10-11 MED ORDER — DIVALPROEX SODIUM 500 MG PO DR TAB: 500.0000 mg | DELAYED_RELEASE_TABLET | Freq: Two times a day (BID) | ORAL | 0 refills | Status: DC

## 2018-10-11 MED ORDER — CLONIDINE HCL 0.2 MG PO TABS: 0.2000 mg | ORAL_TABLET | Freq: Two times a day (BID) | ORAL | 0 refills | Status: DC

## 2018-10-11 NOTE — Telephone Encounter (Signed)
Received refill request from [place] on [address] for Clonidine 0.2 mg take 1 by mouth twice daily and Divalproex Delayed release 500 mg tab--take 1 tab by mouth twice daily.   These rx  given on 09/04/2018 and expired on 10/04/2018. Please advise.

## 2018-10-15 MED ORDER — AMLODIPINE BESYLATE 10 MG PO TABS: 10.0000 mg | ORAL_TABLET | Freq: Every day | ORAL | 0 refills | Status: DC

## 2018-10-15 NOTE — Telephone Encounter (Signed)
Received a message from front desk that the pt is calling back. Unable to leave vm.

## 2018-10-15 NOTE — Telephone Encounter (Signed)
LVM for Madison Palmer to call back, need to inform [place] message below. Tried to call the pt but unable to leave vm.

## 2018-10-15 NOTE — Telephone Encounter (Signed)
LVM for Madison Palmer to call back, need to inform him that we sent rx for amlodipine 90 days supply to pharmacy. Pt also has a cardiology---she just need to call them back and reschedule.

## 2018-10-15 NOTE — Telephone Encounter (Signed)
Spoke with NP Verner Chol. He states that pt does not have a phone right now. [name] states that pt has an appt with psychiatry that they will maintain. [name] states that pt does not have a cardiologist yet but they are trying to find all specialist needed. [name] wants to know if Amlodipine was sent in in chart looks as though it was sent in for 30 days on June 10th. I told him I would follow up with Laurey Arrow to make sure. He states that if calls would come through his phone for now since pt does not have a phone. Thanks.

## 2018-10-17 ENCOUNTER — Encounter (HOSPITAL_COMMUNITY): Payer: Self-pay | Admitting: Psychiatry

## 2018-10-17 ENCOUNTER — Other Ambulatory Visit: Payer: Self-pay

## 2018-10-17 ENCOUNTER — Ambulatory Visit (INDEPENDENT_AMBULATORY_CARE_PROVIDER_SITE_OTHER): Payer: BC Managed Care – PPO | Admitting: Psychiatry

## 2018-10-17 VITALS — Wt 303.0 lb

## 2018-10-17 DIAGNOSIS — F419 Anxiety disorder, unspecified: Secondary | ICD-10-CM

## 2018-10-17 DIAGNOSIS — F309 Manic episode, unspecified: Secondary | ICD-10-CM

## 2018-10-17 DIAGNOSIS — F319 Bipolar disorder, unspecified: Secondary | ICD-10-CM

## 2018-10-17 MED ORDER — DIVALPROEX SODIUM 250 MG PO DR TAB: 250.0000 mg | DELAYED_RELEASE_TABLET | Freq: Two times a day (BID) | ORAL | 1 refills | Status: DC

## 2018-10-17 MED ORDER — PERPHENAZINE 4 MG PO TABS: 4.0000 mg | ORAL_TABLET | Freq: Three times a day (TID) | ORAL | 1 refills | Status: DC

## 2018-10-17 MED ORDER — LURASIDONE HCL 60 MG PO TABS: 60.0000 mg | ORAL_TABLET | Freq: Every day | ORAL | 1 refills | Status: DC

## 2018-10-17 NOTE — Progress Notes (Signed)
Virtual Visit via Video Note  I connected with Madison Palmer on 10/17/18 at  9:00 AM EDT by a video enabled telemedicine application and verified that I am speaking with the correct person using two identifiers.   I discussed the limitations of evaluation and management by telemedicine and the availability of in person appointments. The patient expressed understanding and agreed to proceed.  History of Present Illness: Patient is 53 year old unemployed female who is recently discharged from the hospital.  Patient is a poor historian and most of the information was obtained through her close family friend and review.  Patient admitted twice back-to-back in recent months.  Her first hospitalization at [facility] because of psychosis, bizarre behavior and manic episode.  As per chart she was violent and had not slept in a while.  Upon discharge she had again admission in May 2020 at behavioral health center and at that time she was found to be delusional that she is pregnant.  She was talking to herself, loud, unable to communicate reasonably.  From behavioral health center she was transferred to medical floor due to low oxygen and there she was seen by psychiatry consultation liaison services.  Patient was discharged on Trilafon, Depakote and Latuda.  I spoke to patient and her family friend [name] who provided most of the information.  Apparently patient had never seen psychiatrist and never took the medication in the past but she had a history of depression and she was seeing therapist when she was in [place].  Her friend who told that she had break-up in the relationship in April 2020 and since then she went under a lot of stress, not able to sleep for few days and started to have bizarre behavior, delusions, violent, manic episodes.  Patient do not recall what medicines were given at [facility].  Upon discharge she was recommended to see primary care physician but apparently she  ran out from the medication and again admitted at behavioral health center.  Patient told the current medicine is working and she is sleeping better.  She is back to reality but is still difficulty remembering things.  She is sleeping 6 7 hours.  She denies any paranoia, hallucination but is still have difficulty in her focus, attention and concentration.  Patient has multiple health issues most important overweight and high blood pressure.  Her friend and do mention in April her blood pressure was 240/156.  Currently she is taking 6 antihypertensive medication along with Lasix.  She also using CPAP machine and she is trying to lose weight.  She has lost 50 pounds in past 3 months.  She denies any crying spells, irritability, mood swing, paranoia or any hallucination.  She admitted sadness and anxious because she has not seen her 72 year old son who lives in [place] with his father.  Patient does not want to go back to [place] and [place] where his family lives.  She is very comfortable with her 50 year old family friend [name].  She is currently not seeing any therapist.  She is tolerating her medication and reported no tremors, shakes or any EPS.  Her energy level is fair.  She denies any nightmares or any flashback.  She denies any panic attack but admitted anxiety and nervousness.  Past psychiatric history; History of depression since early age and seen on and off therapist when she was living in [place].  She do not recall seeing psychiatrist until recently when she was admitted at [facility].  She denies any history of suicidal intent, nightmares, panic attack or OCD symptoms.  Patient denies any drug use or drinking alcohol.  Her current psychotropic medications are Depakote 500 mg twice a day, Latuda 60 mg daily and Trilafon 400 mg 3 times a day.  She was also given Ativan at behavioral health center but was discontinued.  Psychosocial history; Patient was adopted.  She married once  however marriage follow part after 14 years.  Her ex and 73 year old son lives in [place].  Patient raised in [place] and then moved to [place] with her husband.  Patient went back to [place] to take care of her adopted parents who died and then in 23-Aug-2014 moved to [place] as she does not want to go back [place].  She was working as a Land in the past.  Alcohol and substance use history; Patient denies any history of drug use or any drinking alcohol.  Medical history; History of hypertension, obesity, sleep apnea, arthritis, chronic fatigue.  Denies any history of seizures, head trauma.  Recent Results (from the past August 23, 2158 hour(s))  Comprehensive metabolic panel     Status: Abnormal   Collection Time: 08/19/18  8:19 PM  Result Value Ref Range   Sodium 142 135 - 145 mmol/L   Potassium 3.4 (L) 3.5 - 5.1 mmol/L   Chloride 103 98 - 111 mmol/L   CO2 28 22 - 32 mmol/L   Glucose, Bld 124 (H) 70 - 99 mg/dL   BUN 20 6 - 20 mg/dL   Creatinine, Ser 0.83 0.44 - 1.00 mg/dL   Calcium 10.8 (H) 8.9 - 10.3 mg/dL   Total Protein 7.7 6.5 - 8.1 g/dL   Albumin 4.1 3.5 - 5.0 g/dL   AST 27 15 - 41 U/L   ALT 30 0 - 44 U/L   Alkaline Phosphatase 91 38 - 126 U/L   Total Bilirubin 1.2 0.3 - 1.2 mg/dL   GFR calc non Af Amer >60 >60 mL/min   GFR calc Af Amer >60 >60 mL/min   Anion gap 11 5 - 15    Comment: Performed at [facility], [place] [address], [place], [place] [address]  Ethanol     Status: None   Collection Time: 08/19/18  8:19 PM  Result Value Ref Range   Alcohol, Ethyl (B) <10 <10 mg/dL    Comment: (NOTE) Lowest detectable limit for serum alcohol is 10 mg/dL. For medical purposes only. Performed at [facility], [place] [address] [place], [place] [address]   Salicylate level     Status: None   Collection Time: 08/19/18  8:19 PM  Result Value Ref Range   Salicylate Lvl <7.9 2.8 - 30.0 mg/dL    Comment: Performed at [facility], [place] [address] [place], [place] [address]  Acetaminophen level     Status: Abnormal   Collection Time: 08/19/18  8:19 PM  Result Value Ref Range   Acetaminophen (Tylenol), Serum <10 (L) 10 - 30 ug/mL    Comment: (NOTE) Therapeutic concentrations vary significantly. A range of 10-30 ug/mL  may be an effective concentration for many patients. However, some  are best treated at concentrations outside of this range. Acetaminophen concentrations >150 ug/mL at 4 hours after ingestion  and >50 ug/mL at 12 hours after ingestion are often associated with  toxic reactions. Performed at [facility], [place] [address] [place], [place] [address]   cbc     Status: Abnormal  Collection Time: 08/19/18  8:19 PM  Result Value Ref Range   WBC 9.9 4.0 - 10.5 K/uL   RBC 5.59 (H) 3.87 - 5.11 MIL/uL   Hemoglobin 16.1 (H) 12.0 - 15.0 g/dL   HCT 50.2 (H) 36.0 - 46.0 %   MCV 89.8 80.0 - 100.0 fL   MCH 28.8 26.0 - 34.0 pg   MCHC 32.1 30.0 - 36.0 g/dL   RDW 15.9 (H) 11.5 - 15.5 %   Platelets 274 150 - 400 K/uL   nRBC 0.0 0.0 - 0.2 %    Comment: Performed at [facility], [place] [address] [place], [place] [address]  Rapid urine drug screen (hospital performed)     Status: None   Collection Time: 08/19/18  8:19 PM  Result Value Ref Range   Opiates NONE DETECTED NONE DETECTED   Cocaine NONE DETECTED NONE DETECTED   Benzodiazepines NONE DETECTED NONE DETECTED   Amphetamines NONE DETECTED NONE DETECTED   Tetrahydrocannabinol NONE DETECTED NONE DETECTED   Barbiturates NONE DETECTED NONE DETECTED    Comment: (NOTE) DRUG SCREEN FOR MEDICAL PURPOSES ONLY.  IF CONFIRMATION IS NEEDED FOR ANY PURPOSE, NOTIFY LAB WITHIN 5 DAYS. LOWEST DETECTABLE LIMITS FOR URINE DRUG SCREEN Drug Class                     Cutoff (ng/mL) Amphetamine and metabolites    1000 Barbiturate and metabolites    200 Benzodiazepine                 433 Tricyclics and metabolites      300 Opiates and metabolites        300 Cocaine and metabolites        300 THC                            50 Performed at [facility], [place] [address], [place], [place] [address]   I-Stat beta hCG blood, ED     Status: None   Collection Time: 08/19/18  8:20 PM  Result Value Ref Range   I-stat hCG, quantitative <5.0 <5 mIU/mL   Comment 3            Comment:   GEST. AGE      CONC.  (mIU/mL)   <=1 WEEK        5 - 50     2 WEEKS       50 - 500     3 WEEKS       100 - 10,000     4 WEEKS     1,000 - 30,000        FEMALE AND NON-PREGNANT FEMALE:     LESS THAN 5 mIU/mL   SARS Coronavirus 2 [facility] order, Performed in [place] hospital lab)     Status: None   Collection Time: 08/20/18  7:55 PM   Specimen: Nasopharyngeal Swab  Result Value Ref Range   SARS Coronavirus 2 NEGATIVE NEGATIVE    Comment: (NOTE) If result is NEGATIVE SARS-CoV-2 target nucleic acids are NOT DETECTED. The SARS-CoV-2 RNA is generally detectable in upper and lower  respiratory specimens during the acute phase of infection. The lowest  concentration of SARS-CoV-2 viral copies this assay can detect is 250  copies / mL. A negative result does not preclude SARS-CoV-2 infection  and should not be used as the sole basis for treatment or other  patient management  decisions.  A negative result may occur with  improper specimen collection / handling, submission of specimen other  than nasopharyngeal swab, presence of viral mutation(s) within the  areas targeted by this assay, and inadequate number of viral copies  (<250 copies / mL). A negative result must be combined with clinical  observations, patient history, and epidemiological information. If result is POSITIVE SARS-CoV-2 target nucleic acids are DETECTED. The SARS-CoV-2 RNA is generally detectable in upper and lower  respiratory specimens dur ing the acute phase of infection.  Positive  results are indicative of active infection with  SARS-CoV-2.  Clinical  correlation with patient history and other diagnostic information is  necessary to determine patient infection status.  Positive results do  not rule out bacterial infection or co-infection with other viruses. If result is PRESUMPTIVE POSTIVE SARS-CoV-2 nucleic acids MAY BE PRESENT.   A presumptive positive result was obtained on the submitted specimen  and confirmed on repeat testing.  While 2019 novel coronavirus  (SARS-CoV-2) nucleic acids may be present in the submitted sample  additional confirmatory testing may be necessary for epidemiological  and / or clinical management purposes  to differentiate between  SARS-CoV-2 and other Sarbecovirus currently known to infect humans.  If clinically indicated additional testing with an alternate test  methodology [phone]) is advised. The SARS-CoV-2 RNA is generally  detectable in upper and lower respiratory sp ecimens during the acute  phase of infection. The expected result is Negative. Fact Sheet for Patients:  StrictlyIdeas.no Fact Sheet for Healthcare Providers: BankingDealers.co.za This test is not yet approved or cleared by the [place] FDA and has been authorized for detection and/or diagnosis of SARS-CoV-2 by FDA under an Emergency Use Authorization (EUA).  This EUA will remain in effect (meaning this test can be used) for the duration of the COVID-19 declaration under Section 564(b)(1) of the Act, 21 U.S.C. section 360bbb-3(b)(1), unless the authorization is terminated or revoked sooner. Performed at [facility], [place] [address] [place], [place] [address]   CBG monitoring, ED     Status: Abnormal   Collection Time: 08/20/18  9:15 PM  Result Value Ref Range   Glucose-Capillary 168 (H) 70 - 99 mg/dL   Comment 1 Notify RN   Comprehensive metabolic panel     Status: Abnormal   Collection Time: 09/11/18 10:24 PM  Result Value Ref Range    Sodium 144 135 - 145 mmol/L   Potassium 3.7 3.5 - 5.1 mmol/L   Chloride 106 98 - 111 mmol/L   CO2 28 22 - 32 mmol/L   Glucose, Bld 120 (H) 70 - 99 mg/dL   BUN 27 (H) 6 - 20 mg/dL   Creatinine, Ser 1.07 (H) 0.44 - 1.00 mg/dL   Calcium 10.1 8.9 - 10.3 mg/dL   Total Protein 6.8 6.5 - 8.1 g/dL   Albumin 3.8 3.5 - 5.0 g/dL   AST 34 15 - 41 U/L   ALT 82 (H) 0 - 44 U/L   Alkaline Phosphatase 79 38 - 126 U/L   Total Bilirubin 0.7 0.3 - 1.2 mg/dL   GFR calc non Af Amer 60 (L) >60 mL/min   GFR calc Af Amer >60 >60 mL/min   Anion gap 10 5 - 15    Comment: Performed at [name], [place] [address] [place], [place] [address]  Ethanol     Status: None   Collection Time: 09/11/18 10:24 PM  Result Value Ref Range   Alcohol, Ethyl (B) <  10 <10 mg/dL    Comment: (NOTE) Lowest detectable limit for serum alcohol is 10 mg/dL. For medical purposes only. Performed at [facility], [place] [address], [place], [place] [address]   Urine rapid drug screen (hosp performed)     Status: None   Collection Time: 09/11/18 10:24 PM  Result Value Ref Range   Opiates NONE DETECTED NONE DETECTED   Cocaine NONE DETECTED NONE DETECTED   Benzodiazepines NONE DETECTED NONE DETECTED   Amphetamines NONE DETECTED NONE DETECTED   Tetrahydrocannabinol NONE DETECTED NONE DETECTED   Barbiturates NONE DETECTED NONE DETECTED    Comment: (NOTE) DRUG SCREEN FOR MEDICAL PURPOSES ONLY.  IF CONFIRMATION IS NEEDED FOR ANY PURPOSE, NOTIFY LAB WITHIN 5 DAYS. LOWEST DETECTABLE LIMITS FOR URINE DRUG SCREEN Drug Class                     Cutoff (ng/mL) Amphetamine and metabolites    1000 Barbiturate and metabolites    200 Benzodiazepine                 629 Tricyclics and metabolites     300 Opiates and metabolites        300 Cocaine and metabolites        300 THC                            50 Performed at [facility], [place] [address], [place], [place] [address]    CBC with Diff     Status: None   Collection Time: 09/11/18 10:24 PM  Result Value Ref Range   WBC 6.6 4.0 - 10.5 K/uL   RBC 4.47 3.87 - 5.11 MIL/uL   Hemoglobin 13.3 12.0 - 15.0 g/dL   HCT 41.6 36.0 - 46.0 %   MCV 93.1 80.0 - 100.0 fL   MCH 29.8 26.0 - 34.0 pg   MCHC 32.0 30.0 - 36.0 g/dL   RDW 14.7 11.5 - 15.5 %   Platelets 219 150 - 400 K/uL   nRBC 0.0 0.0 - 0.2 %   Neutrophils Relative % 68 %   Neutro Abs 4.6 1.7 - 7.7 K/uL   Lymphocytes Relative 20 %   Lymphs Abs 1.3 0.7 - 4.0 K/uL   Monocytes Relative 9 %   Monocytes Absolute 0.6 0.1 - 1.0 K/uL   Eosinophils Relative 1 %   Eosinophils Absolute 0.1 0.0 - 0.5 K/uL   Basophils Relative 1 %   Basophils Absolute 0.0 0.0 - 0.1 K/uL   Immature Granulocytes 1 %   Abs Immature Granulocytes 0.05 0.00 - 0.07 K/uL    Comment: Performed at [facility], [place] [address] [place], [place] [address]  hCG, quantitative, pregnancy     Status: None   Collection Time: 09/11/18 10:24 PM  Result Value Ref Range   hCG, Beta Chain, Quant, S 4 <5 mIU/mL    Comment:          GEST. AGE      CONC.  (mIU/mL)   <=1 WEEK        5 - 50     2 WEEKS       50 - 500     3 WEEKS       100 - 10,000     4 WEEKS     1,000 - 30,000     5 WEEKS     3,500 - 115,000  6-8 WEEKS     12,000 - 270,000    12 WEEKS     15,000 - 220,000        FEMALE AND NON-PREGNANT FEMALE:     LESS THAN 5 mIU/mL Performed at [facility], [place] [address] [place], [place] [address]   I-Stat beta hCG blood, ED     Status: Abnormal   Collection Time: 09/11/18 10:29 PM  Result Value Ref Range   I-stat hCG, quantitative 6.6 (H) <5 mIU/mL   Comment 3            Comment:   GEST. AGE      CONC.  (mIU/mL)   <=1 WEEK        5 - 50     2 WEEKS       50 - 500     3 WEEKS       100 - 10,000     4 WEEKS     1,000 - 30,000        FEMALE AND NON-PREGNANT FEMALE:     LESS THAN 5 mIU/mL   SARS Coronavirus 2 (CEPHEID - Performed in [place] hospital  lab), Hosp Order     Status: None   Collection Time: 09/12/18  1:39 PM   Specimen: Nasopharyngeal Swab  Result Value Ref Range   SARS Coronavirus 2 NEGATIVE NEGATIVE    Comment: (NOTE) If result is NEGATIVE SARS-CoV-2 target nucleic acids are NOT DETECTED. The SARS-CoV-2 RNA is generally detectable in upper and lower  respiratory specimens during the acute phase of infection. The lowest  concentration of SARS-CoV-2 viral copies this assay can detect is 250  copies / mL. A negative result does not preclude SARS-CoV-2 infection  and should not be used as the sole basis for treatment or other  patient management decisions.  A negative result may occur with  improper specimen collection / handling, submission of specimen other  than nasopharyngeal swab, presence of viral mutation(s) within the  areas targeted by this assay, and inadequate number of viral copies  (<250 copies / mL). A negative result must be combined with clinical  observations, patient history, and epidemiological information. If result is POSITIVE SARS-CoV-2 target nucleic acids are DETECTED. The SARS-CoV-2 RNA is generally detectable in upper and lower  respiratory specimens dur ing the acute phase of infection.  Positive  results are indicative of active infection with SARS-CoV-2.  Clinical  correlation with patient history and other diagnostic information is  necessary to determine patient infection status.  Positive results do  not rule out bacterial infection or co-infection with other viruses. If result is PRESUMPTIVE POSTIVE SARS-CoV-2 nucleic acids MAY BE PRESENT.   A presumptive positive result was obtained on the submitted specimen  and confirmed on repeat testing.  While 2019 novel coronavirus  (SARS-CoV-2) nucleic acids may be present in the submitted sample  additional confirmatory testing may be necessary for epidemiological  and / or clinical management purposes  to differentiate between  SARS-CoV-2 and  other Sarbecovirus currently known to infect humans.  If clinically indicated additional testing with an alternate test  methodology [phone]) is advised. The SARS-CoV-2 RNA is generally  detectable in upper and lower respiratory sp ecimens during the acute  phase of infection. The expected result is Negative. Fact Sheet for Patients:  StrictlyIdeas.no Fact Sheet for Healthcare Providers: BankingDealers.co.za This test is not yet approved or cleared by the [place] FDA and has been authorized for detection and/or  diagnosis of SARS-CoV-2 by FDA under an Emergency Use Authorization (EUA).  This EUA will remain in effect (meaning this test can be used) for the duration of the COVID-19 declaration under Section 564(b)(1) of the Act, 21 U.S.C. section 360bbb-3(b)(1), unless the authorization is terminated or revoked sooner. Performed at [facility], [place] [address] [place], [place] [address]   Troponin I - ONCE - STAT     Status: Abnormal   Collection Time: 09/15/18  9:18 AM  Result Value Ref Range   Troponin I 0.03 (HH) <0.03 ng/mL    Comment: CRITICAL RESULT CALLED TO, READ BACK BY AND VERIFIED WITH: Thurmond Butts AT 1050 09/15/2018 BY L BENFIELD Performed at [facility], [place] [address] [place], [place] [address]   Brain natriuretic peptide     Status: Abnormal   Collection Time: 09/15/18  9:18 AM  Result Value Ref Range   B Natriuretic Peptide 147.2 (H) 0.0 - 100.0 pg/mL    Comment: Performed at [place] [address], [place], [place] [address]  D-dimer, quantitative (not at [facility])     Status: Abnormal   Collection Time: 09/15/18  9:18 AM  Result Value Ref Range   D-Dimer, Quant 0.57 (H) 0.00 - 0.50 ug/mL-FEU    Comment: (NOTE) At the manufacturer cut-off of 0.50 ug/mL FEU, this assay has been documented to exclude PE with a sensitivity and negative predictive value of 97 to 99%.  At this time,  this assay has not been approved by the FDA to exclude DVT/VTE. Results should be correlated with clinical presentation. Performed at [facility], [place] [address] [place], [place] [address]   CBC with Differential     Status: None   Collection Time: 09/15/18  9:18 AM  Result Value Ref Range   WBC 5.0 4.0 - 10.5 K/uL   RBC 4.72 3.87 - 5.11 MIL/uL   Hemoglobin 13.7 12.0 - 15.0 g/dL   HCT 43.7 36.0 - 46.0 %   MCV 92.6 80.0 - 100.0 fL   MCH 29.0 26.0 - 34.0 pg   MCHC 31.4 30.0 - 36.0 g/dL   RDW 13.8 11.5 - 15.5 %   Platelets 204 150 - 400 K/uL   nRBC 0.0 0.0 - 0.2 %   Neutrophils Relative % 60 %   Neutro Abs 3.0 1.7 - 7.7 K/uL   Lymphocytes Relative 25 %   Lymphs Abs 1.3 0.7 - 4.0 K/uL   Monocytes Relative 10 %   Monocytes Absolute 0.5 0.1 - 1.0 K/uL   Eosinophils Relative 3 %   Eosinophils Absolute 0.1 0.0 - 0.5 K/uL   Basophils Relative 1 %   Basophils Absolute 0.0 0.0 - 0.1 K/uL   Immature Granulocytes 1 %   Abs Immature Granulocytes 0.04 0.00 - 0.07 K/uL    Comment: Performed at [facility], [address], [place],  [address]  Comprehensive metabolic panel     Status: Abnormal   Collection Time: 09/15/18  9:18 AM  Result Value Ref Range   Sodium 144 135 - 145 mmol/L   Potassium 3.8 3.5 - 5.1 mmol/L   Chloride 100 98 - 111 mmol/L   CO2 33 (H) 22 - 32 mmol/L   Glucose, Bld 101 (H) 70 - 99 mg/dL   BUN 8 6 - 20 mg/dL   Creatinine, Ser 1.12 (H) 0.44 - 1.00 mg/dL   Calcium 10.2 8.9 - 10.3 mg/dL   Total Protein 5.9 (L) 6.5 -  8.1 g/dL   Albumin 3.3 (L) 3.5 - 5.0 g/dL   AST 26 15 - 41 U/L   ALT 58 (H) 0 - 44 U/L   Alkaline Phosphatase 73 38 - 126 U/L   Total Bilirubin 1.2 0.3 - 1.2 mg/dL   GFR calc non Af Amer 56 (L) >60 mL/min   GFR calc Af Amer >60 >60 mL/min   Anion gap 11 5 - 15    Comment: Performed at [place] [address], [place], [place] [address]  Lactic acid, plasma     Status: None   Collection Time: 09/15/18  9:18 AM   Result Value Ref Range   Lactic Acid, Venous 1.1 0.5 - 1.9 mmol/L    Comment: Performed at [place] [address], [place], [place] [address]  SARS Coronavirus 2 (CEPHEID - Performed in [place] hospital lab), Hosp Order     Status: None   Collection Time: 09/15/18  9:18 AM   Specimen: Nasopharyngeal Swab  Result Value Ref Range   SARS Coronavirus 2 NEGATIVE NEGATIVE    Comment: (NOTE) If result is NEGATIVE SARS-CoV-2 target nucleic acids are NOT DETECTED. The SARS-CoV-2 RNA is generally detectable in upper and lower  respiratory specimens during the acute phase of infection. The lowest  concentration of SARS-CoV-2 viral copies this assay can detect is 250  copies / mL. A negative result does not preclude SARS-CoV-2 infection  and should not be used as the sole basis for treatment or other  patient management decisions.  A negative result may occur with  improper specimen collection / handling, submission of specimen other  than nasopharyngeal swab, presence of viral mutation(s) within the  areas targeted by this assay, and inadequate number of viral copies  (<250 copies / mL). A negative result must be combined with clinical  observations, patient history, and epidemiological information. If result is POSITIVE SARS-CoV-2 target nucleic acids are DETECTED. The SARS-CoV-2 RNA is generally detectable in upper and lower  respiratory specimens dur ing the acute phase of infection.  Positive  results are indicative of active infection with SARS-CoV-2.  Clinical  correlation with patient history and other diagnostic information is  necessary to determine patient infection status.  Positive results do  not rule out bacterial infection or co-infection with other viruses. If result is PRESUMPTIVE POSTIVE SARS-CoV-2 nucleic acids MAY BE PRESENT.   A presumptive positive result was obtained on the submitted specimen  and confirmed on repeat testing.  While 2019 novel  coronavirus  (SARS-CoV-2) nucleic acids may be present in the submitted sample  additional confirmatory testing may be necessary for epidemiological  and / or clinical management purposes  to differentiate between  SARS-CoV-2 and other Sarbecovirus currently known to infect humans.  If clinically indicated additional testing with an alternate test  methodology [phone]) is advised. The SARS-CoV-2 RNA is generally  detectable in upper and lower respiratory sp ecimens during the acute  phase of infection. The expected result is Negative. Fact Sheet for Patients:  StrictlyIdeas.no Fact Sheet for Healthcare Providers: BankingDealers.co.za This test is not yet approved or cleared by the [place] FDA and has been authorized for detection and/or diagnosis of SARS-CoV-2 by FDA under an Emergency Use Authorization (EUA).  This EUA will remain in effect (meaning this test can be used) for the duration of the COVID-19 declaration under Section 564(b)(1) of the Act, 21 U.S.C. section 360bbb-3(b)(1), unless the authorization is terminated or revoked sooner. Performed at  [facility], [place] [address], [place], [place] [address]   Troponin I - ONCE - STAT     Status: Abnormal   Collection Time: 09/15/18 12:48 PM  Result Value Ref Range   Troponin I 0.03 (HH) <0.03 ng/mL    Comment: CRITICAL VALUE NOTED.  VALUE IS CONSISTENT WITH PREVIOUSLY REPORTED AND CALLED VALUE. Performed at [facility], [place] [address], [place], [place] [address]   I-STAT 7, (LYTES, BLD GAS, ICA, H+H)     Status: Abnormal   Collection Time: 09/15/18  3:51 PM  Result Value Ref Range   pH, Arterial 7.359 7.350 - 7.450   pCO2 arterial 64.9 (H) 32.0 - 48.0 mmHg   pO2, Arterial 195.0 (H) 83.0 - 108.0 mmHg   Bicarbonate 36.6 (H) 20.0 - 28.0 mmol/L   TCO2 39 (H) 22 - 32 mmol/L   O2 Saturation 100.0 %   Acid-Base Excess 9.0 (H) 0.0 - 2.0 mmol/L   Sodium 140 135 -  145 mmol/L   Potassium 3.7 3.5 - 5.1 mmol/L   Calcium, Ion 1.39 1.15 - 1.40 mmol/L   HCT 38.0 36.0 - 46.0 %   Hemoglobin 12.9 12.0 - 15.0 g/dL   Patient temperature HIDE    Collection site RADIAL, ALLEN'S TEST ACCEPTABLE    Sample type ARTERIAL   CBC     Status: None   Collection Time: 09/16/18  3:01 AM  Result Value Ref Range   WBC 6.1 4.0 - 10.5 K/uL   RBC 4.67 3.87 - 5.11 MIL/uL   Hemoglobin 13.9 12.0 - 15.0 g/dL   HCT 42.7 36.0 - 46.0 %   MCV 91.4 80.0 - 100.0 fL   MCH 29.8 26.0 - 34.0 pg   MCHC 32.6 30.0 - 36.0 g/dL   RDW 13.9 11.5 - 15.5 %   Platelets 194 150 - 400 K/uL   nRBC 0.0 0.0 - 0.2 %    Comment: Performed at [facility], [place] [address] [place], [place] [address]  Basic metabolic panel     Status: Abnormal   Collection Time: 09/16/18  3:01 AM  Result Value Ref Range   Sodium 138 135 - 145 mmol/L   Potassium 3.3 (L) 3.5 - 5.1 mmol/L   Chloride 96 (L) 98 - 111 mmol/L   CO2 32 22 - 32 mmol/L   Glucose, Bld 140 (H) 70 - 99 mg/dL   BUN 9 6 - 20 mg/dL   Creatinine, Ser 0.87 0.44 - 1.00 mg/dL   Calcium 9.6 8.9 - 10.3 mg/dL   GFR calc non Af Amer >60 >60 mL/min   GFR calc Af Amer >60 >60 mL/min   Anion gap 10 5 - 15    Comment: Performed at [facility], [place] [address] [place], [place] [address]  TSH     Status: None   Collection Time: 09/16/18  3:01 AM  Result Value Ref Range   TSH 0.409 0.350 - 4.500 uIU/mL    Comment: Performed by a 3rd Generation assay with a functional sensitivity of <=0.01 uIU/mL. Performed at [facility], [place] [address], [place], [place] [address]   Basic metabolic panel     Status: Abnormal   Collection Time: 09/17/18  2:41 AM  Result Value Ref Range   Sodium 140 135 - 145 mmol/L   Potassium 4.0 3.5 - 5.1 mmol/L    Comment: DELTA CHECK NOTED   Chloride 95 (L) 98 - 111 mmol/L   CO2  36 (H) 22 - 32 mmol/L   Glucose, Bld 122 (H) 70 - 99 mg/dL   BUN 10 6 - 20 mg/dL   Creatinine, Ser 0.94 0.44 - 1.00 mg/dL   Calcium  10.0 8.9 - 10.3 mg/dL   GFR calc non Af Amer >60 >60 mL/min   GFR calc Af Amer >60 >60 mL/min   Anion gap 9 5 - 15    Comment: Performed at [place] [address] [place], [place] [address]  Magnesium     Status: None   Collection Time: 09/17/18  2:41 AM  Result Value Ref Range   Magnesium 2.0 1.7 - 2.4 mg/dL    Comment: Performed at [facility] [address], [place], [place] [address]  MRSA PCR Screening     Status: None   Collection Time: 09/17/18  4:44 AM   Specimen: Nasal Mucosa; Nasopharyngeal  Result Value Ref Range   MRSA by PCR NEGATIVE NEGATIVE    Comment:        The GeneXpert MRSA Assay (FDA approved for NASAL specimens only), is one component of a comprehensive MRSA colonization surveillance program. It is not intended to diagnose MRSA infection nor to guide or monitor treatment for MRSA infections. Performed at [facility], [place] [address], [place], [place] [address]   Basic metabolic panel     Status: Abnormal   Collection Time: 09/18/18  3:42 AM  Result Value Ref Range   Sodium 140 135 - 145 mmol/L   Potassium 4.1 3.5 - 5.1 mmol/L   Chloride 98 98 - 111 mmol/L   CO2 34 (H) 22 - 32 mmol/L   Glucose, Bld 130 (H) 70 - 99 mg/dL   BUN 14 6 - 20 mg/dL   Creatinine, Ser 0.86 0.44 - 1.00 mg/dL   Calcium 10.0 8.9 - 10.3 mg/dL   GFR calc non Af Amer >60 >60 mL/min   GFR calc Af Amer >60 >60 mL/min   Anion gap 8 5 - 15    Comment: Performed at [facility], [place] [address], [place], [place] [address]  Basic metabolic panel     Status: Abnormal   Collection Time: 09/20/18  3:13 AM  Result Value Ref Range   Sodium 141 135 - 145 mmol/L   Potassium 4.4 3.5 - 5.1 mmol/L   Chloride 98 98 - 111 mmol/L   CO2 34 (H) 22 - 32 mmol/L   Glucose, Bld 116 (H) 70 - 99 mg/dL   BUN 23 (H) 6 - 20 mg/dL   Creatinine, Ser 0.94 0.44 - 1.00 mg/dL   Calcium 10.2 8.9 - 10.3 mg/dL   GFR calc non Af Amer >60 >60 mL/min   GFR calc Af Amer >60 >60 mL/min    Anion gap 9 5 - 15    Comment: Performed at [facility], [place] [address], [place], [place] [address]  Magnesium     Status: None   Collection Time: 09/20/18  3:13 AM  Result Value Ref Range   Magnesium 2.1 1.7 - 2.4 mg/dL    Comment: Performed at [facility] [address], [place], [place] [address]  Glucose, capillary     Status: Abnormal   Collection Time: 09/21/18  9:41 AM  Result Value Ref Range   Glucose-Capillary 139 (H) 70 - 99 mg/dL  Basic metabolic panel     Status: Abnormal   Collection Time: 09/23/18  2:37 AM  Result Value  Ref Range   Sodium 143 135 - 145 mmol/L   Potassium 4.6 3.5 - 5.1 mmol/L   Chloride 100 98 - 111 mmol/L   CO2 37 (H) 22 - 32 mmol/L   Glucose, Bld 116 (H) 70 - 99 mg/dL   BUN 22 (H) 6 - 20 mg/dL   Creatinine, Ser 0.89 0.44 - 1.00 mg/dL   Calcium 10.2 8.9 - 10.3 mg/dL   GFR calc non Af Amer >60 >60 mL/min   GFR calc Af Amer >60 >60 mL/min   Anion gap 6 5 - 15    Comment: Performed at [facility] [address], [place], [place] [address]  Magnesium     Status: None   Collection Time: 09/23/18  2:37 AM  Result Value Ref Range   Magnesium 2.3 1.7 - 2.4 mg/dL    Comment: Performed at [facility], [place] [address], [place], [place] [address]  Basic metabolic panel     Status: Abnormal   Collection Time: 09/24/18  2:36 AM  Result Value Ref Range   Sodium 141 135 - 145 mmol/L   Potassium 4.4 3.5 - 5.1 mmol/L   Chloride 99 98 - 111 mmol/L   CO2 35 (H) 22 - 32 mmol/L   Glucose, Bld 119 (H) 70 - 99 mg/dL   BUN 22 (H) 6 - 20 mg/dL   Creatinine, Ser 0.85 0.44 - 1.00 mg/dL   Calcium 11.0 (H) 8.9 - 10.3 mg/dL   GFR calc non Af Amer >60 >60 mL/min   GFR calc Af Amer >60 >60 mL/min   Anion gap 7 5 - 15    Comment: Performed at [facility], [place] [address] [place], [place] [address]  Magnesium     Status: None   Collection Time: 09/24/18  2:36 AM  Result Value Ref Range   Magnesium 2.3 1.7 - 2.4 mg/dL    Comment:  Performed at [facility] [address], [place], [place] [address]  Basic metabolic panel     Status: Abnormal   Collection Time: 09/25/18  3:13 AM  Result Value Ref Range   Sodium 142 135 - 145 mmol/L   Potassium 4.3 3.5 - 5.1 mmol/L   Chloride 101 98 - 111 mmol/L   CO2 35 (H) 22 - 32 mmol/L   Glucose, Bld 122 (H) 70 - 99 mg/dL   BUN 21 (H) 6 - 20 mg/dL   Creatinine, Ser 0.77 0.44 - 1.00 mg/dL   Calcium 10.6 (H) 8.9 - 10.3 mg/dL   GFR calc non Af Amer >60 >60 mL/min   GFR calc Af Amer >60 >60 mL/min   Anion gap 6 5 - 15    Comment: Performed at [facility], [place] [address] [place], [place] [address]  Magnesium     Status: None   Collection Time: 09/25/18  3:13 AM  Result Value Ref Range   Magnesium 2.1 1.7 - 2.4 mg/dL    Comment: Performed at [facility] [address], [place], [place] [address]  CBC with Differential/Platelet     Status: None   Collection Time: 09/25/18  3:13 AM  Result Value Ref Range   WBC 5.4 4.0 - 10.5 K/uL   RBC 4.27 3.87 - 5.11 MIL/uL   Hemoglobin 12.7 12.0 - 15.0 g/dL   HCT 40.4 36.0 - 46.0 %   MCV 94.6 80.0 - 100.0 fL   MCH 29.7 26.0 - 34.0 pg  MCHC 31.4 30.0 - 36.0 g/dL   RDW 14.2 11.5 - 15.5 %   Platelets 229 150 - 400 K/uL   nRBC 0.0 0.0 - 0.2 %   Neutrophils Relative % 65 %   Neutro Abs 3.5 1.7 - 7.7 K/uL   Lymphocytes Relative 20 %   Lymphs Abs 1.1 0.7 - 4.0 K/uL   Monocytes Relative 12 %   Monocytes Absolute 0.6 0.1 - 1.0 K/uL   Eosinophils Relative 1 %   Eosinophils Absolute 0.1 0.0 - 0.5 K/uL   Basophils Relative 1 %   Basophils Absolute 0.0 0.0 - 0.1 K/uL   Immature Granulocytes 1 %   Abs Immature Granulocytes 0.04 0.00 - 0.07 K/uL    Comment: Performed at [facility] [address], [place], [place] [address]     Psychiatric Specialty Exam: Physical Exam  ROS  Weight (!) 303 lb (137.4 kg), last menstrual period 05/02/2017.Body mass index is 53.67 kg/m.  General Appearance: Casual and over  weight  Eye Contact:  Fair  Speech:  Slow  Volume:  Normal  Mood:  Dysphoric  Affect:  Constricted  Thought Process:  Descriptions of Associations: Intact  Orientation:  Full (Time, Place, and Person)  Thought Content:  Rumination  Suicidal Thoughts:  No  Homicidal Thoughts:  No  Memory:  Immediate;   Fair Recent;   Fair Remote;   Fair  Judgement:  Fair  Insight:  Fair  Psychomotor Activity:  Decreased  Concentration:  Concentration: Fair and Attention Span: Fair  Recall:  [employer] of Knowledge:  average  Language:  Good  Akathisia:  No  Handed:  Right  AIMS (if indicated):     Assets:  Communication Skills Desire for Improvement Housing Resilience Social Support  ADL's:  Intact  Cognition:  WNL  Sleep:         Assessment and Plan: Chrisma is a 53 year old female who was recently admitted in April 2020 and then May 2020 due to psychosis, mania, delusions and unable to function to take care of herself.  She do not recall any past history of psychiatric inpatient treatment or receiving any psychotropic medication.  Currently she is taking Depakote 500 mg twice a day, Latuda 60 mg at bedtime, Trilafon 4 mg 3 times a day.  She also trying to lose weight and she had lost 50 pounds in past 2 months.  Patient has multiple health issues more important high blood pressure as she is taking 6 antihypertensive medication.  She has CPAP and she has chronic pain.  We discussed since she does not have a past psychiatric history and currently she is taking 2 antipsychotic medication we will try to slowly cut down her psychotropic medication.  I recommend to try Depakote 250 mg twice a day but we will continue her Trilafon and Latuda at present dose for now.  I do believe she should resume therapy for her anxiety and we will schedule appointment in our office for CBT.  I also discussed medication side effect specially taking 2 antipsychotic medications at this time.  We discussed safety concern  that anytime having active suicidal thoughts or homicidal thought then she need to call 911 or go to local emergency room.  I review blood work results, discharge summary and current medication from recent hospitalization.  Follow-up in 3 to 4 weeks.  Follow Up Instructions:    I discussed the assessment and treatment plan with the patient. The patient was provided an opportunity  to ask questions and all were answered. The patient agreed with the plan and demonstrated an understanding of the instructions.   The patient was advised to call back or seek an in-person evaluation if the symptoms worsen or if the condition fails to improve as anticipated.  I provided 55 minutes of non-face-to-face time during this encounter.   Kathlee Nations, MD

## 2018-10-18 ENCOUNTER — Other Ambulatory Visit: Payer: Self-pay | Admitting: Nurse Practitioner

## 2018-10-18 DIAGNOSIS — F319 Bipolar disorder, unspecified: Secondary | ICD-10-CM

## 2018-10-18 DIAGNOSIS — F419 Anxiety disorder, unspecified: Secondary | ICD-10-CM

## 2018-10-18 NOTE — Telephone Encounter (Signed)
[name] please advise, not in current med list.

## 2018-11-06 ENCOUNTER — Other Ambulatory Visit: Payer: Self-pay | Admitting: Nurse Practitioner

## 2018-11-06 ENCOUNTER — Other Ambulatory Visit: Payer: Self-pay | Admitting: Cardiology

## 2018-11-06 DIAGNOSIS — Z1231 Encounter for screening mammogram for malignant neoplasm of breast: Secondary | ICD-10-CM

## 2018-11-06 DIAGNOSIS — I10 Essential (primary) hypertension: Secondary | ICD-10-CM

## 2018-11-06 MED ORDER — LABETALOL HCL 300 MG PO TABS: 600.0000 mg | ORAL_TABLET | Freq: Two times a day (BID) | ORAL | 1 refills | Status: DC

## 2018-11-06 MED ORDER — AMLODIPINE BESYLATE 10 MG PO TABS: 10.0000 mg | ORAL_TABLET | Freq: Every day | ORAL | 1 refills | Status: DC

## 2018-11-06 NOTE — Telephone Encounter (Signed)
Requesting a refill on amlodipine and labetalol. These medications were not prescribed by Dr. Percival Spanish. Please address

## 2018-11-07 ENCOUNTER — Other Ambulatory Visit: Payer: Self-pay | Admitting: Nurse Practitioner

## 2018-11-07 DIAGNOSIS — I10 Essential (primary) hypertension: Secondary | ICD-10-CM

## 2018-11-07 DIAGNOSIS — I160 Hypertensive urgency: Secondary | ICD-10-CM

## 2018-11-10 ENCOUNTER — Other Ambulatory Visit (INDEPENDENT_AMBULATORY_CARE_PROVIDER_SITE_OTHER): Payer: Self-pay | Admitting: Family Medicine

## 2018-11-16 ENCOUNTER — Other Ambulatory Visit: Payer: Self-pay | Admitting: Gastroenterology

## 2018-11-16 ENCOUNTER — Other Ambulatory Visit: Payer: Self-pay | Admitting: Nurse Practitioner

## 2018-11-16 DIAGNOSIS — I10 Essential (primary) hypertension: Secondary | ICD-10-CM

## 2018-11-16 DIAGNOSIS — I160 Hypertensive urgency: Secondary | ICD-10-CM

## 2018-11-18 ENCOUNTER — Other Ambulatory Visit (INDEPENDENT_AMBULATORY_CARE_PROVIDER_SITE_OTHER): Payer: Self-pay | Admitting: Family Medicine

## 2018-11-18 ENCOUNTER — Other Ambulatory Visit (INDEPENDENT_AMBULATORY_CARE_PROVIDER_SITE_OTHER): Payer: Self-pay

## 2018-11-18 ENCOUNTER — Encounter (INDEPENDENT_AMBULATORY_CARE_PROVIDER_SITE_OTHER): Payer: Self-pay | Admitting: Family Medicine

## 2018-11-18 DIAGNOSIS — E119 Type 2 diabetes mellitus without complications: Secondary | ICD-10-CM

## 2018-11-18 MED ORDER — METFORMIN HCL 500 MG PO TABS: 500.0000 mg | ORAL_TABLET | Freq: Every day | ORAL | 0 refills | Status: DC

## 2018-11-27 ENCOUNTER — Encounter (HOSPITAL_COMMUNITY): Payer: Self-pay | Admitting: Psychiatry

## 2018-11-27 ENCOUNTER — Ambulatory Visit (INDEPENDENT_AMBULATORY_CARE_PROVIDER_SITE_OTHER): Payer: BC Managed Care – PPO | Admitting: Psychiatry

## 2018-11-27 ENCOUNTER — Other Ambulatory Visit: Payer: Self-pay

## 2018-11-27 DIAGNOSIS — F319 Bipolar disorder, unspecified: Secondary | ICD-10-CM

## 2018-11-27 DIAGNOSIS — F419 Anxiety disorder, unspecified: Secondary | ICD-10-CM | POA: Diagnosis not present

## 2018-11-27 MED ORDER — DIVALPROEX SODIUM 250 MG PO DR TAB: 250.0000 mg | DELAYED_RELEASE_TABLET | Freq: Two times a day (BID) | ORAL | 1 refills | Status: DC

## 2018-11-27 MED ORDER — LURASIDONE HCL 60 MG PO TABS: 60.0000 mg | ORAL_TABLET | Freq: Every day | ORAL | 1 refills | Status: DC

## 2018-11-27 MED ORDER — PERPHENAZINE 4 MG PO TABS: 4.0000 mg | ORAL_TABLET | Freq: Every day | ORAL | 1 refills | Status: DC

## 2018-11-27 NOTE — Progress Notes (Signed)
Virtual Visit via Telephone Note  I connected with Madison Palmer on 11/27/18 at  2:20 PM EDT by telephone and verified that I am speaking with the correct person using two identifiers.   I discussed the limitations, risks, security and privacy concerns of performing an evaluation and management service by telephone and the availability of in person appointments. I also discussed with the patient that there may be a patient responsible charge related to this service. The patient expressed understanding and agreed to proceed.   History of Present Illness: Patient was evaluated by phone session.  She is a 53 year old unemployed female who was last seen in June 18 for initial appointment.  Patient was admitted back to back in May 2020 for psychosis, bizarre behavior and violent and manic episode.  She was discharged on Depakote, Latuda, Trilafon.  Patient has no history of previous psychiatric inpatient treatment or mania.  We had cut down her Depakote.  Patient is doing much better.  She still has no recollection why she was admitted and do not remember her behavior while she was hospitalized.  She admitted had a break-up in April and currently she has no contact with the person.  She is doing better.  She is sleeping good.  She had a very good close friend [name] who helps her to manage the medication.  She has been not able to see a therapist which was recommended on the last visit as she did not receive any phone call.  She denies any hallucination, paranoia and denies any impulsive behavior or irritability.  As per his friend and review she is much calmer when she sleeps good.  She denies any crying spells or any feeling of hopelessness or worthlessness.  She is taking multiple medication for blood pressure and she is scheduled to have a visit in August.  Her appetite is okay.  Her energy level is good.  She uses CPAP machine which helps her sleep.  She lives by herself.  Most of her family lives in  [place].  She is a 24 year old son who lives with his father in [place].      Past psychiatric history; H/O depression since early age and seen on and off therapist while living in [place].  Do not recall seeing psychiatrist until admitted at [facility] in May 20202 for delusion, psychosis and violent behavior. D/C on Depakote 500 mg BID, Latuda 60 mg daily and Trilafon 12 mg a day. No h/o suicidal attempt, nightmares, panic attack or OCD symptoms.  No h/o drinking and drug use.     Psychiatric Specialty Exam: Physical Exam  ROS  Last menstrual period 05/02/2017.There is no height or weight on file to calculate BMI.  General Appearance: NA  Eye Contact:  NA  Speech:  Clear and Coherent  Volume:  Normal  Mood:  Euthymic  Affect:  NA  Thought Process:  Descriptions of Associations: Intact  Orientation:  Full (Time, Place, and Person)  Thought Content:  Rumination  Suicidal Thoughts:  No  Homicidal Thoughts:  No  Memory:  Immediate;   Fair Recent;   Good Remote;   Good  Judgement:  Good  Insight:  Good  Psychomotor Activity:  NA  Concentration:  Concentration: Fair and Attention Span: Fair  Recall:  Good  Fund of Knowledge:  Fair  Language:  Good  Akathisia:  No  Handed:  Right  AIMS (if indicated):     Assets:  Communication Skills Desire for Improvement  Housing Resilience Social Support  ADL's:  Intact  Cognition:  WNL  Sleep:         Assessment and Plan: Bipolar disorder type I.  Anxiety.  Patient doing better on her medication.  We have cut down her Depakote from 500 mg twice a day to 250 mg twice a day.  She is still on [place] and Trilafon.  Recommend to cut down her Trilafon to 4 mg a day and continue Latuda 60 mg a day and continue Depakote 250 mg twice a day.  Patient does not have establish past psychiatric history except that she was admitted back to back twice in May 2020.  We will consider lowering the medication as patient is doing  better.  I also believe she should see a therapist and we will schedule appointment to see a therapist in our office.  I reminded her if she has any worsening of symptoms or having insomnia or change in the mood then she should call us immediately.  Her friend [name] who is also a nurse is involved in the treatment plan and plan was discussed with him with the permission of the patient.  Follow-up in 2 months.  Encouraged to keep appointment with primary care physician for the management of health issues.  Encourage healthy lifestyle and watch her calorie intake and do regular exercise.  Time spent 25 minutes.  More than 50% of the time spent in psychoeducation, counseling and coordination of care and also discuss safety concern that anytime having active suicidal thoughts or homicidal thought injury to call 911 or go to local emergency room.   Follow Up Instructions:    I discussed the assessment and treatment plan with the patient. The patient was provided an opportunity to ask questions and all were answered. The patient agreed with the plan and demonstrated an understanding of the instructions.   The patient was advised to call back or seek an in-person evaluation if the symptoms worsen or if the condition fails to improve as anticipated.  I provided 25 minutes of non-face-to-face time during this encounter.   Kathlee Nations, MD

## 2018-11-28 ENCOUNTER — Other Ambulatory Visit: Payer: Self-pay

## 2018-11-28 ENCOUNTER — Telehealth (INDEPENDENT_AMBULATORY_CARE_PROVIDER_SITE_OTHER): Payer: BC Managed Care – PPO | Admitting: Family Medicine

## 2018-11-28 ENCOUNTER — Encounter (INDEPENDENT_AMBULATORY_CARE_PROVIDER_SITE_OTHER): Payer: Self-pay

## 2018-11-28 ENCOUNTER — Encounter (INDEPENDENT_AMBULATORY_CARE_PROVIDER_SITE_OTHER): Payer: Self-pay | Admitting: Family Medicine

## 2018-11-28 DIAGNOSIS — I10 Essential (primary) hypertension: Secondary | ICD-10-CM | POA: Diagnosis not present

## 2018-11-28 DIAGNOSIS — R7303 Prediabetes: Secondary | ICD-10-CM

## 2018-11-28 DIAGNOSIS — Z6841 Body Mass Index (BMI) 40.0 and over, adult: Secondary | ICD-10-CM

## 2018-11-28 NOTE — Progress Notes (Signed)
Office: [phone]  /  Fax: [phone] TeleHealth Visit:  Madison Palmer has verbally consented to this TeleHealth visit today. The patient is located at home, the provider is located at the [facility] office. The participants in this visit include the listed provider and patient. Madison Palmer was unable to use realtime audiovisual technology today and the telehealth visit was conducted via telephone (22 minutes).   HPI:   Chief Complaint: OBESITY Madison Palmer is here to discuss her progress with her obesity treatment plan. She is on the Category 3 plan and is following her eating plan approximately 0 % of the time. She states she is walking in the pool. Madison Palmer and her medical caregiver are present on audio call. She has stopped eating out and has lost weight during all this time. She had two psychiatric hospitalizations in April and again in May. Her weight is of 295 lbs. We were unable to weigh the patient today for this TeleHealth visit. She feels as if she has lost weight since her last visit. She has lost 27 lbs since starting treatment with [place].  Hypertension Madison Palmer is a 53 y.o. female with hypertension. Madison Palmer blood pressure was 131/88, much better controlled. She is on 6 medications and denies chest pain. She is working on weight loss to help control her blood pressure with the goal of decreasing her risk of heart attack and stroke.   Pre-Diabetes Madison Palmer has a diagnosis of pre-diabetes based on her elevated Hgb A1c and was informed this puts her at greater risk of developing diabetes. Last Hgb A1c was 6.0 in October 2019. She is taking metformin currently and continues to work on diet and exercise to decrease risk of diabetes. She denies nausea or hypoglycemia.  ASSESSMENT AND PLAN:  Essential hypertension  Prediabetes  Class 3 severe obesity with serious comorbidity and body mass index (BMI) of 60.0 to 69.9 in adult, unspecified obesity type (Madison Palmer)  PLAN:   Hypertension We discussed sodium restriction, working on healthy weight loss, and a regular exercise program as the means to achieve improved blood pressure control. Madison Palmer agreed with this plan and agreed to follow up as directed. We will continue to monitor her blood pressure as well as her progress with the above lifestyle modifications. Madison Palmer agrees to continue her medications and will watch for signs of hypotension as she continues her lifestyle modifications. She is follow up with Dr. Percival Spanish in September. Madison Palmer agrees to follow up with our clinic in 2 weeks.   Pre-Diabetes Madison Palmer will continue to work on weight loss, exercise, and decreasing simple carbohydrates in her diet to help decrease the risk of diabetes. We dicussed metformin including benefits and risks. She was informed that eating too many simple carbohydrates or too many calories at one sitting increases the likelihood of GI side effects. Madison Palmer agrees to continue taking metformin and we will repeat labs at her next appointment. Madison Palmer agrees to follow up with our clinic in 2 weeks as directed to monitor her progress.  Obesity Madison Palmer is currently in the action stage of change. As such, her goal is to continue with weight loss efforts She has agreed to follow the Category 3 plan Madison Palmer has been instructed to work up to a goal of 150 minutes of combined cardio and strengthening exercise per week for weight loss and overall health benefits. We discussed the following Behavioral Modification Strategies today: increasing lean protein intake, increasing vegetables and work on meal planning and easy cooking plans,  keeping healthy foods in the home, and planning for success   Madison Palmer has agreed to follow up with our clinic in 2 weeks. She was informed of the importance of frequent follow up visits to maximize her success with intensive lifestyle modifications for her multiple health conditions.  ALLERGIES: Allergies  Allergen  Reactions  . Fentanyl Hives  . Levofloxacin Hives  . Midazolam Hives  . Pollen Extract     seasonal  . Atorvastatin     Muscle pain in legs   . Doxycycline Itching and Swelling  . Hydralazine Hcl Other (See Comments)    Hypercalcemia   . Rosuvastatin     Abdominal pain    MEDICATIONS: Current Outpatient Medications on File Prior to Visit  Medication Sig Dispense Refill  . acetaminophen (TYLENOL) 500 MG tablet Take 1,000 mg by mouth 3 (three) times daily as needed for moderate pain or headache.    Marland Kitchen amLODipine (NORVASC) 10 MG tablet Take 1 tablet (10 mg total) by mouth daily. 90 tablet 1  . cetirizine (ZYRTEC ALLERGY) 10 MG tablet Take 1 tablet (10 mg total) by mouth daily. 30 tablet 5  . cloNIDine (CATAPRES) 0.2 MG tablet Take 1 tablet (0.2 mg total) by mouth 2 (two) times daily. Needs office visit with Dr. Percival Spanish for additional refills 180 tablet 0  . divalproex (DEPAKOTE) 250 MG DR tablet Take 1 tablet (250 mg total) by mouth 2 (two) times daily. 60 tablet 1  . esomeprazole (NEXIUM) 40 MG capsule TAKE 1 CAPSULE BY MOUTH TWICE DAILY 180 capsule 2  . fluticasone (FLONASE) 50 MCG/ACT nasal spray Place 2 sprays into both nostrils daily.    . furosemide (LASIX) 40 MG tablet Take 1 tablet (40 mg total) by mouth daily.    Marland Kitchen ibuprofen (ADVIL) 200 MG tablet Take 400 mg by mouth every 6 (six) hours as needed for moderate pain.    Marland Kitchen labetalol (NORMODYNE) 300 MG tablet Take 2 tablets (600 mg total) by mouth 2 (two) times a day. 360 tablet 1  . levothyroxine (SYNTHROID) 75 MCG tablet Take 75 mcg by mouth as directed. 5 days weekly    . lisinopril (ZESTRIL) 40 MG tablet Take 40 mg by mouth daily.    . Lurasidone HCl 60 MG TABS Take 1 tablet (60 mg total) by mouth daily after supper. 30 tablet 1  . metFORMIN (GLUCOPHAGE) 500 MG tablet Take 1 tablet (500 mg total) by mouth daily with breakfast. 30 tablet 0  . perphenazine (TRILAFON) 4 MG tablet Take 1 tablet (4 mg total) by mouth daily. 30 tablet  1  . potassium chloride SA (K-DUR,KLOR-CON) 20 MEQ tablet Take 2 tablets (40 mEq total) by mouth daily. 180 tablet 2  . Vitamin D, Ergocalciferol, (DRISDOL) 1.25 MG (50000 UT) CAPS capsule TAKE 1 CAPSULE BY MOUTH EVERY 7 DAYS 12 capsule 0   No current facility-administered medications on file prior to visit.     PAST MEDICAL HISTORY: Past Medical History:  Diagnosis Date  . Anemia   . Anxiety   . Arrhythmia    tachycardia  . Arthritis   . Chickenpox   . Depression   . Diverticulitis   . GERD (gastroesophageal reflux disease)   . Glaucoma   . Hyperlipidemia   . Hyperparathyroidism ([place])   . Hypertension   . Inflammatory polyps of colon ([place])   . LVH (left ventricular hypertrophy)   . Lymphedema   . PCOS (polycystic ovarian syndrome)   . Prediabetes   . Recurrent  UTI   . Sleep apnea    CPAP  . Thyroid disease   . Vitamin D deficiency     PAST SURGICAL HISTORY: Past Surgical History:  Procedure Laterality Date  . BREAST BIOPSY  2015  . CESAREAN SECTION  2004  . INNER EAR SURGERY     ear and sinus surgery  . LAPAROSCOPIC REPAIR AND REMOVAL OF GASTRIC BAND    . OOPHORECTOMY Left   . TONSILLECTOMY AND ADENOIDECTOMY      SOCIAL HISTORY: Social History   Tobacco Use  . Smoking status: Never Smoker  . Smokeless tobacco: Never Used  Substance Use Topics  . Alcohol use: Yes    Comment: social  . Drug use: No    FAMILY HISTORY: Family History  Adopted: Yes  Problem Relation Age of Onset  . Hearing loss Son        right   . Healthy Son     ROS: Review of Systems  Constitutional: Positive for weight loss.  Cardiovascular: Negative for chest pain.  Gastrointestinal: Negative for nausea.  Endo/Heme/Allergies:       Negative hypoglycemia    PHYSICAL EXAM: Pt in no acute distress  RECENT LABS AND TESTS: BMET    Component Value Date/Time   NA 142 09/25/2018 0313   NA 143 07/08/2018 1359   K 4.3 09/25/2018 0313   CL 101 09/25/2018 0313   CO2 35 (H)  09/25/2018 0313   GLUCOSE 122 (H) 09/25/2018 0313   BUN 21 (H) 09/25/2018 0313   BUN 30 (H) 07/08/2018 1359   CREATININE 0.77 09/25/2018 0313   CREATININE 0.94 12/21/2017 1640   CALCIUM 10.6 (H) 09/25/2018 0313   GFRNONAA >60 09/25/2018 0313   GFRAA >60 09/25/2018 0313   Lab Results  Component Value Date   HGBA1C 6.0 (H) 02/27/2018   HGBA1C 5.7 09/10/2017   HGBA1C 5.9 05/29/2017   Lab Results  Component Value Date   INSULIN 9.6 02/27/2018   CBC    Component Value Date/Time   WBC 5.4 09/25/2018 0313   RBC 4.27 09/25/2018 0313   HGB 12.7 09/25/2018 0313   HCT 40.4 09/25/2018 0313   PLT 229 09/25/2018 0313   MCV 94.6 09/25/2018 0313   MCH 29.7 09/25/2018 0313   MCHC 31.4 09/25/2018 0313   RDW 14.2 09/25/2018 0313   LYMPHSABS 1.1 09/25/2018 0313   MONOABS 0.6 09/25/2018 0313   EOSABS 0.1 09/25/2018 0313   BASOSABS 0.0 09/25/2018 0313   Iron/TIBC/Ferritin/ %Sat    Component Value Date/Time   IRON 44 02/20/2018 1536   IRON 81 05/29/2017   TIBC 347 05/29/2017   IRONPCTSAT 8.8 (L) 02/20/2018 1536   Lipid Panel     Component Value Date/Time   CHOL 196 02/27/2018 1233   TRIG 142 02/27/2018 1233   HDL 46 02/27/2018 1233   CHOLHDL 5 09/10/2017 1156   VLDL 26.6 09/10/2017 1156   LDLCALC 122 (H) 02/27/2018 1233   Hepatic Function Panel     Component Value Date/Time   PROT 5.9 (L) 09/15/2018 0918   PROT 6.9 02/27/2018 1233   ALBUMIN 3.3 (L) 09/15/2018 0918   ALBUMIN 4.3 02/27/2018 1233   AST 26 09/15/2018 0918   ALT 58 (H) 09/15/2018 0918   ALKPHOS 73 09/15/2018 0918   BILITOT 1.2 09/15/2018 0918   BILITOT 0.9 02/27/2018 1233   BILIDIR 0.3 (H) 05/11/2018 0630   IBILI 1.3 (H) 05/11/2018 0630      Component Value Date/Time   TSH 0.409 09/16/2018  0301   TSH 0.938 05/11/2018 0630   TSH 1.450 02/27/2018 1233   TSH 0.97 12/11/2017 1158   TSH 1.28 05/29/2017      I, Trixie Dredge, am acting as transcriptionist for Ilene Qua, MD   I have reviewed  the above documentation for accuracy and completeness, and I agree with the above. - Ilene Qua, MD

## 2018-12-04 ENCOUNTER — Other Ambulatory Visit (INDEPENDENT_AMBULATORY_CARE_PROVIDER_SITE_OTHER): Payer: Self-pay | Admitting: Family Medicine

## 2018-12-04 DIAGNOSIS — E119 Type 2 diabetes mellitus without complications: Secondary | ICD-10-CM

## 2018-12-05 ENCOUNTER — Other Ambulatory Visit (INDEPENDENT_AMBULATORY_CARE_PROVIDER_SITE_OTHER): Payer: Self-pay

## 2018-12-05 DIAGNOSIS — E119 Type 2 diabetes mellitus without complications: Secondary | ICD-10-CM

## 2018-12-05 MED ORDER — METFORMIN HCL 500 MG PO TABS: 500.0000 mg | ORAL_TABLET | Freq: Two times a day (BID) | ORAL | 0 refills | Status: DC

## 2018-12-10 ENCOUNTER — Ambulatory Visit (INDEPENDENT_AMBULATORY_CARE_PROVIDER_SITE_OTHER): Payer: BC Managed Care – PPO | Admitting: Family Medicine

## 2018-12-10 ENCOUNTER — Encounter (INDEPENDENT_AMBULATORY_CARE_PROVIDER_SITE_OTHER): Payer: Self-pay | Admitting: Family Medicine

## 2018-12-10 ENCOUNTER — Other Ambulatory Visit: Payer: Self-pay

## 2018-12-10 VITALS — BP 102/66 | HR 81 | Temp 98.1°F | Ht 62.0 in | Wt 297.0 lb

## 2018-12-10 DIAGNOSIS — R7303 Prediabetes: Secondary | ICD-10-CM | POA: Diagnosis not present

## 2018-12-10 DIAGNOSIS — Z9189 Other specified personal risk factors, not elsewhere classified: Secondary | ICD-10-CM

## 2018-12-10 DIAGNOSIS — R0602 Shortness of breath: Secondary | ICD-10-CM | POA: Diagnosis not present

## 2018-12-10 DIAGNOSIS — Z6841 Body Mass Index (BMI) 40.0 and over, adult: Secondary | ICD-10-CM

## 2018-12-11 NOTE — Progress Notes (Signed)
Office: [phone]  /  Fax: [phone]   HPI:   Chief Complaint: OBESITY Madison Palmer is here to discuss her progress with her obesity treatment plan. She is on the Category 3 plan and is following her eating plan approximately 60% of the time. She states she is walking 10 minutes 3 times per week. Madison Palmer returns to the clinic for the first time in 5 months. She had a recent hospitalization secondary to mania and psychosis and is currently being cared for by a nurse practitioner friend. She has had significant weight loss within the last 4 months. Her weight is 297 lb (134.7 kg) today and has had a weight loss of 33 pounds over a period of 5 months since her last in-office visit. She has lost 60 lbs since starting treatment with [place].  Pre-Diabetes Madison Palmer has a diagnosis of prediabetes based on her elevated Hgb A1c and was informed this puts her at greater risk of developing diabetes. She recently increased her metformin to BID and reports no GI side effects. She continues to work on diet and exercise to decrease risk of diabetes.  At risk for diabetes Madison Palmer is at higher than average risk for developing diabetes due to her obesity. She currently denies polyuria or polydipsia.  Shortness of Breath Madison Palmer's shortness of breath symptoms are better controlled than initially. She has had weight loss with recent medical complication.  ASSESSMENT AND PLAN:  Prediabetes  SOB (shortness of breath) on exertion  At risk for diabetes mellitus  Class 3 severe obesity with serious comorbidity and body mass index (BMI) of 50.0 to 59.9 in adult, unspecified obesity type Madison Palmer)  PLAN:  Pre-Diabetes Madison Palmer will continue to work on weight loss, exercise, and decreasing simple carbohydrates in her diet to help decrease the risk of diabetes. We dicussed metformin including benefits and risks. She was informed that eating too many simple carbohydrates or too many calories at one sitting increases the  likelihood of GI side effects. Madison Palmer will decrease metformin to daily and will have repeat labs at her next appointment.  Diabetes risk counseling Madison Palmer was given extended (15 minutes) diabetes prevention counseling today. She is 53 y.o. female and has risk factors for diabetes including obesity. We discussed intensive lifestyle modifications today with an emphasis on weight loss as well as increasing exercise and decreasing simple carbohydrates in her diet.  Shortness of Breath Madison Palmer's shortness of breath appears to be obesity related and exercise induced. The indirect calorimeter results showed VO2 of 182 and a REE of 1264. She has agreed to work on weight loss and gradually increase exercise to treat her exercise induced shortness of breath. If Madison Palmer follows our instructions and loses weight without improvement of her shortness of breath, we will plan to refer to pulmonology. Madison Palmer agrees to this plan.  Obesity Madison Palmer is currently in the action stage of change. As such, her goal is to continue with weight loss efforts. She has agreed to follow the Category 2 plan. Madison Palmer has been instructed to work up to a goal of 150 minutes of combined cardio and strengthening exercise per week for weight loss and overall health benefits. We discussed the following Behavioral Modification Strategies today: increasing lean protein intake, increasing vegetables, work on meal planning and easy cooking plans, keeping healthy foods in the home, and planning for success.  Madison Palmer has agreed to follow-up with our clinic in 2 weeks. She was informed of the importance of frequent follow-up visits to maximize her success with intensive  lifestyle modifications for her multiple health conditions.  ALLERGIES: Allergies  Allergen Reactions  . Fentanyl Hives  . Levofloxacin Hives  . Midazolam Hives  . Pollen Extract     seasonal  . Atorvastatin     Muscle pain in legs   . Doxycycline Itching and Swelling  .  Hydralazine Hcl Other (See Comments)    Hypercalcemia   . Rosuvastatin     Abdominal pain    MEDICATIONS: Current Outpatient Medications on File Prior to Visit  Medication Sig Dispense Refill  . acetaminophen (TYLENOL) 500 MG tablet Take 1,000 mg by mouth 3 (three) times daily as needed for moderate pain or headache.    Marland Kitchen amLODipine (NORVASC) 10 MG tablet Take 1 tablet (10 mg total) by mouth daily. 90 tablet 1  . cetirizine (ZYRTEC ALLERGY) 10 MG tablet Take 1 tablet (10 mg total) by mouth daily. 30 tablet 5  . cloNIDine (CATAPRES) 0.2 MG tablet Take 1 tablet (0.2 mg total) by mouth 2 (two) times daily. Needs office visit with Dr. Percival Spanish for additional refills 180 tablet 0  . divalproex (DEPAKOTE) 250 MG DR tablet Take 1 tablet (250 mg total) by mouth 2 (two) times daily. 60 tablet 1  . esomeprazole (NEXIUM) 40 MG capsule TAKE 1 CAPSULE BY MOUTH TWICE DAILY 180 capsule 2  . fluticasone (FLONASE) 50 MCG/ACT nasal spray Place 2 sprays into both nostrils daily.    . furosemide (LASIX) 40 MG tablet Take 1 tablet (40 mg total) by mouth daily.    Marland Kitchen levothyroxine (SYNTHROID) 75 MCG tablet Take 75 mcg by mouth as directed. 5 days weekly    . lisinopril (ZESTRIL) 40 MG tablet Take 40 mg by mouth daily.    . Lurasidone HCl 60 MG TABS Take 1 tablet (60 mg total) by mouth daily after supper. 30 tablet 1  . metFORMIN (GLUCOPHAGE) 500 MG tablet Take 1 tablet (500 mg total) by mouth 2 (two) times daily with a meal. 14 tablet 0  . perphenazine (TRILAFON) 4 MG tablet Take 1 tablet (4 mg total) by mouth daily. 30 tablet 1  . potassium chloride SA (K-DUR,KLOR-CON) 20 MEQ tablet Take 2 tablets (40 mEq total) by mouth daily. 180 tablet 2  . Vitamin D, Ergocalciferol, (DRISDOL) 1.25 MG (50000 UT) CAPS capsule TAKE 1 CAPSULE BY MOUTH EVERY 7 DAYS 12 capsule 0  . ibuprofen (ADVIL) 200 MG tablet Take 400 mg by mouth every 6 (six) hours as needed for moderate pain.    Marland Kitchen labetalol (NORMODYNE) 300 MG tablet Take 2  tablets (600 mg total) by mouth 2 (two) times a day. 360 tablet 1   No current facility-administered medications on file prior to visit.     PAST MEDICAL HISTORY: Past Medical History:  Diagnosis Date  . Anemia   . Anxiety   . Arrhythmia    tachycardia  . Arthritis   . Chickenpox   . Depression   . Diverticulitis   . GERD (gastroesophageal reflux disease)   . Glaucoma   . Hyperlipidemia   . Hyperparathyroidism ([place])   . Hypertension   . Inflammatory polyps of colon ([place])   . LVH (left ventricular hypertrophy)   . Lymphedema   . PCOS (polycystic ovarian syndrome)   . Prediabetes   . Recurrent UTI   . Sleep apnea    CPAP  . Thyroid disease   . Vitamin D deficiency     PAST SURGICAL HISTORY: Past Surgical History:  Procedure Laterality Date  . BREAST  BIOPSY  2015  . CESAREAN SECTION  2004  . INNER EAR SURGERY     ear and sinus surgery  . LAPAROSCOPIC REPAIR AND REMOVAL OF GASTRIC BAND    . OOPHORECTOMY Left   . TONSILLECTOMY AND ADENOIDECTOMY      SOCIAL HISTORY: Social History   Tobacco Use  . Smoking status: Never Smoker  . Smokeless tobacco: Never Used  Substance Use Topics  . Alcohol use: Yes    Comment: social  . Drug use: No    FAMILY HISTORY: Family History  Adopted: Yes  Problem Relation Age of Onset  . Hearing loss Son        right   . Healthy Son    ROS: Review of Systems  Respiratory: Positive for shortness of breath.    PHYSICAL EXAM: Blood pressure 102/66, pulse 81, temperature 98.1 F (36.7 C), temperature source Oral, height 5\' 2"  (1.575 m), weight 297 lb (134.7 kg), last menstrual period 05/02/2017, SpO2 94 %. Body mass index is 54.32 kg/m. Physical Exam Vitals signs reviewed.  Constitutional:      Appearance: Normal appearance. She is obese.  Cardiovascular:     Rate and Rhythm: Normal rate.     Pulses: Normal pulses.  Pulmonary:     Effort: Pulmonary effort is normal.     Breath sounds: Normal breath sounds.   Musculoskeletal: Normal range of motion.  Skin:    General: Skin is warm and dry.  Neurological:     Mental Status: She is alert and oriented to person, place, and time.  Psychiatric:        Behavior: Behavior normal.   RECENT LABS AND TESTS: BMET    Component Value Date/Time   NA 142 09/25/2018 0313   NA 143 07/08/2018 1359   K 4.3 09/25/2018 0313   CL 101 09/25/2018 0313   CO2 35 (H) 09/25/2018 0313   GLUCOSE 122 (H) 09/25/2018 0313   BUN 21 (H) 09/25/2018 0313   BUN 30 (H) 07/08/2018 1359   CREATININE 0.77 09/25/2018 0313   CREATININE 0.94 12/21/2017 1640   CALCIUM 10.6 (H) 09/25/2018 0313   GFRNONAA >60 09/25/2018 0313   GFRAA >60 09/25/2018 0313   Lab Results  Component Value Date   HGBA1C 6.0 (H) 02/27/2018   HGBA1C 5.7 09/10/2017   HGBA1C 5.9 05/29/2017   Lab Results  Component Value Date   INSULIN 9.6 02/27/2018   CBC    Component Value Date/Time   WBC 5.4 09/25/2018 0313   RBC 4.27 09/25/2018 0313   HGB 12.7 09/25/2018 0313   HCT 40.4 09/25/2018 0313   PLT 229 09/25/2018 0313   MCV 94.6 09/25/2018 0313   MCH 29.7 09/25/2018 0313   MCHC 31.4 09/25/2018 0313   RDW 14.2 09/25/2018 0313   LYMPHSABS 1.1 09/25/2018 0313   MONOABS 0.6 09/25/2018 0313   EOSABS 0.1 09/25/2018 0313   BASOSABS 0.0 09/25/2018 0313   Iron/TIBC/Ferritin/ %Sat    Component Value Date/Time   IRON 44 02/20/2018 1536   IRON 81 05/29/2017   TIBC 347 05/29/2017   IRONPCTSAT 8.8 (L) 02/20/2018 1536   Lipid Panel     Component Value Date/Time   CHOL 196 02/27/2018 1233   TRIG 142 02/27/2018 1233   HDL 46 02/27/2018 1233   CHOLHDL 5 09/10/2017 1156   VLDL 26.6 09/10/2017 1156   LDLCALC 122 (H) 02/27/2018 1233   Hepatic Function Panel     Component Value Date/Time   PROT 5.9 (L) 09/15/2018 0388  PROT 6.9 02/27/2018 1233   ALBUMIN 3.3 (L) 09/15/2018 0918   ALBUMIN 4.3 02/27/2018 1233   AST 26 09/15/2018 0918   ALT 58 (H) 09/15/2018 0918   ALKPHOS 73 09/15/2018 0918    BILITOT 1.2 09/15/2018 0918   BILITOT 0.9 02/27/2018 1233   BILIDIR 0.3 (H) 05/11/2018 0630   IBILI 1.3 (H) 05/11/2018 0630      Component Value Date/Time   TSH 0.409 09/16/2018 0301   TSH 0.938 05/11/2018 0630   TSH 1.450 02/27/2018 1233   TSH 0.97 12/11/2017 1158   TSH 1.28 05/29/2017   Results for BENJAMIN, Madison Palmer (MRN [id number]) as of 12/11/2018 16:42  Ref. Range 05/29/2017 00:00  Vitamin D, 25-Hydroxy Unknown 18   OBESITY BEHAVIORAL INTERVENTION VISIT  Today's visit was #14  Starting weight: 357 lbs Starting date: 02/27/2018 Today's weight: 297 lbs  Today's date: 12/10/2018 Total lbs lost to date: 60   12/10/2018  Height 5\' 2"  (1.575 m)  Weight 297 lb (134.7 kg)  BMI (Calculated) 54.31  BLOOD PRESSURE - SYSTOLIC 244  BLOOD PRESSURE - DIASTOLIC 66   Body Fat % 62.8 %   ASK: We discussed the diagnosis of obesity with Madison Palmer today and Madison Palmer agreed to give [place] permission to discuss obesity behavioral modification therapy today.  ASSESS: Madison Palmer has the diagnosis of obesity and her BMI today is 54.4. Madison Palmer is in the action stage of change.  ADVISE: Madison Palmer was educated on the multiple health risks of obesity as well as the benefit of weight loss to improve her health. She was advised of the need for long term treatment and the importance of lifestyle modifications to improve her current health and to decrease her risk of future health problems.  AGREE: Multiple dietary modification options and treatment options were discussed and  Madison Palmer agreed to follow the recommendations documented in the above note.  ARRANGE: Madison Palmer was educated on the importance of frequent visits to treat obesity as outlined per CMS and USPSTF guidelines and agreed to schedule her next follow up appointment today.  I, Michaelene Song, am acting as transcriptionist for Ilene Qua, MD  I have reviewed the above documentation for accuracy and completeness, and I agree with the above. -  Ilene Qua, MD

## 2018-12-18 ENCOUNTER — Other Ambulatory Visit (INDEPENDENT_AMBULATORY_CARE_PROVIDER_SITE_OTHER): Payer: Self-pay | Admitting: Family Medicine

## 2018-12-18 DIAGNOSIS — E119 Type 2 diabetes mellitus without complications: Secondary | ICD-10-CM

## 2018-12-19 ENCOUNTER — Ambulatory Visit
Admission: RE | Admit: 2018-12-19 | Discharge: 2018-12-19 | Disposition: A | Discharge status: Home / Self Care | Payer: BC Managed Care – PPO | Source: Ambulatory Visit | Attending: Nurse Practitioner | Admitting: Nurse Practitioner

## 2018-12-19 ENCOUNTER — Other Ambulatory Visit: Payer: Self-pay

## 2018-12-19 ENCOUNTER — Encounter (INDEPENDENT_AMBULATORY_CARE_PROVIDER_SITE_OTHER): Payer: Self-pay | Admitting: Family Medicine

## 2018-12-19 DIAGNOSIS — Z1231 Encounter for screening mammogram for malignant neoplasm of breast: Secondary | ICD-10-CM

## 2018-12-20 ENCOUNTER — Other Ambulatory Visit: Payer: Self-pay | Admitting: Nurse Practitioner

## 2018-12-20 DIAGNOSIS — E119 Type 2 diabetes mellitus without complications: Secondary | ICD-10-CM

## 2018-12-20 NOTE — Telephone Encounter (Signed)
Please see refill request.

## 2018-12-20 NOTE — Telephone Encounter (Signed)
Requested medication (s) are due for refill today: yes  Requested medication (s) are on the active medication list: yes  Last refill:  12/05/18 #14  Future visit scheduled: yes  Notes to clinic:  Unable to refill per protocol. Medication previously filled by a different provider.     Requested Prescriptions  Pending Prescriptions Disp Refills   metFORMIN (GLUCOPHAGE) 500 MG tablet 14 tablet 0    Sig: Take 1 tablet (500 mg total) by mouth 2 (two) times daily with a meal.     Endocrinology:  Diabetes - Biguanides Failed - 12/20/2018  4:35 PM      Failed - HBA1C is between 0 and 7.9 and within 180 days    Hemoglobin A1C  Date Value Ref Range Status  05/29/2017 5.9  Final   Hgb A1c MFr Bld  Date Value Ref Range Status  02/27/2018 6.0 (H) 4.8 - 5.6 % Final    Comment:             Prediabetes: 5.7 - 6.4          Diabetes: >6.4          Glycemic control for adults with diabetes: <7.0          Passed - Cr in normal range and within 360 days    Creat  Date Value Ref Range Status  12/21/2017 0.94 0.50 - 1.05 mg/dL Final    Comment:    For patients >49 years of age, the reference limit for Creatinine is approximately 13% higher for people identified as African-American. .    Creatinine, Ser  Date Value Ref Range Status  09/25/2018 0.77 0.44 - 1.00 mg/dL Final         Passed - eGFR in normal range and within 360 days    GFR calc Af Amer  Date Value Ref Range Status  09/25/2018 >60 >60 mL/min Final   GFR calc non Af Amer  Date Value Ref Range Status  09/25/2018 >60 >60 mL/min Final   GFR  Date Value Ref Range Status  06/18/2018 64.87 >60.00 mL/min Final         Passed - Valid encounter within last 6 months    Recent Outpatient Visits          2 months ago Essential hypertension   [facility] [name], [place], NP   7 months ago Acute respiratory failure with hypoxia [facility])   [facility] [name], Charlene Brooke, NP   7  months ago Hypoxia   [facility] [place], NP   8 months ago Acute URI   [facility] [place], [place], NP   9 months ago Nipple pain   [facility] [place], Charlene Brooke, NP      Future Appointments            In 4 days [name], Charlene Brooke, NP [facility], [place]   In 3 weeks Volanda Napoleon, Ree Shay, NP [facility]   In 3 weeks Minus Breeding, MD [facility] [place], [facility]

## 2018-12-20 NOTE — Telephone Encounter (Signed)
Medication Refill - Medication: metFORMIN (GLUCOPHAGE) 500 MG tablet  Has the patient contacted their pharmacy? yes (Agent: If no, request that the patient contact the pharmacy for the refill.) (Agent: If yes, when and what did the pharmacy advise?)  Preferred Pharmacy (with phone number or street name):     [id number] - [place], [address] AT [address] [phone] (Phone) [phone] (Fax)   Agent: Please be advised that RX refills may take up to 3 business days. We ask that you follow-up with your pharmacy.

## 2018-12-21 ENCOUNTER — Other Ambulatory Visit: Payer: Self-pay | Admitting: Nurse Practitioner

## 2018-12-21 DIAGNOSIS — I10 Essential (primary) hypertension: Secondary | ICD-10-CM

## 2018-12-23 ENCOUNTER — Other Ambulatory Visit (INDEPENDENT_AMBULATORY_CARE_PROVIDER_SITE_OTHER): Payer: Self-pay | Admitting: Family Medicine

## 2018-12-23 ENCOUNTER — Telehealth: Payer: Self-pay | Admitting: Nurse Practitioner

## 2018-12-23 DIAGNOSIS — E119 Type 2 diabetes mellitus without complications: Secondary | ICD-10-CM

## 2018-12-23 NOTE — Telephone Encounter (Signed)

## 2018-12-24 ENCOUNTER — Other Ambulatory Visit: Payer: Self-pay

## 2018-12-24 ENCOUNTER — Ambulatory Visit: Payer: BC Managed Care – PPO | Admitting: Nurse Practitioner

## 2018-12-24 ENCOUNTER — Encounter: Payer: Self-pay | Admitting: Nurse Practitioner

## 2018-12-24 VITALS — BP 110/70 | HR 73 | Temp 98.0°F | Ht 62.0 in | Wt 309.2 lb

## 2018-12-24 DIAGNOSIS — E782 Mixed hyperlipidemia: Secondary | ICD-10-CM

## 2018-12-24 DIAGNOSIS — I152 Hypertension secondary to endocrine disorders: Secondary | ICD-10-CM

## 2018-12-24 DIAGNOSIS — E119 Type 2 diabetes mellitus without complications: Secondary | ICD-10-CM

## 2018-12-24 DIAGNOSIS — R7303 Prediabetes: Secondary | ICD-10-CM | POA: Diagnosis not present

## 2018-12-24 DIAGNOSIS — E038 Other specified hypothyroidism: Secondary | ICD-10-CM

## 2018-12-24 MED ORDER — LISINOPRIL 40 MG PO TABS: 40.0000 mg | ORAL_TABLET | Freq: Every day | ORAL | 1 refills | Status: DC

## 2018-12-24 MED ORDER — METFORMIN HCL 500 MG PO TABS: 500.0000 mg | ORAL_TABLET | Freq: Two times a day (BID) | ORAL | 0 refills | Status: DC

## 2018-12-24 MED ORDER — METFORMIN HCL 500 MG PO TABS: 500.0000 mg | ORAL_TABLET | Freq: Two times a day (BID) | ORAL | 1 refills | Status: DC

## 2018-12-24 MED ORDER — HYDRALAZINE HCL 50 MG PO TABS: 100.0000 mg | ORAL_TABLET | Freq: Two times a day (BID) | ORAL | 5 refills | Status: DC

## 2018-12-24 NOTE — Patient Instructions (Addendum)
Stable BMP, hepatic panel, thyroid function and hgbA1c. Abnormal lipid panel. This is due to a combination of diet, lack of exercise and use of antipsychotic medications. Need to start fenofibrate to help stable lipid panel above and minimize risk for cardiovascular disease. Hx of statin intolerance F/up in 74months as discussed  Check BP 2hrs after morning medications. Call office if BP<100/70  Metformin and hydralazine refill sent.  Maintain appt with Dr. Percival Spanish.

## 2018-12-24 NOTE — Progress Notes (Signed)
Subjective:  Patient ID: [place] Palmer, female    DOB: 1966/01/29  Age: 53 y.o. MRN: [id number]  CC: Follow-up (3 mo follow up on BP--metformin refill? no flu shot)  Accompanied by [place] Palmer (caregiver and friend)  HPI Bipolar Disorder with manic episode: Managed by [place] Palmer(psychiatry), waiting for appt with psychologist. Current use of trilafon, latuda, and depakote, developed tremors. She states [place] Palmer is working on adjustment medication doses.  HTN: Improved post parathyroidectomy Home BP readings in AM: 140s/80s Denies any dizziness or palpation, no chest pain, no PND. Current use of labetalol, hydralazine, amlodipine, lisinopril, and clonidine. Upcoming appt with [place] Palmer 01/16/2019 BP Readings from Last 3 Encounters:  12/24/18 110/70  12/10/18 102/66  09/27/18 (!) 162/93   CHF: No longer dependent on supplemental oxygen. Stable weight with furosemide and potassium.  Denies any dizziness or palpation, no chest pain, no PND. Wt Readings from Last 3 Encounters:  12/24/18 (!) 309 lb 3.2 oz (140.3 kg)  12/10/18 297 lb (134.7 kg)  10/17/18 (!) 303 lb (137.4 kg)   Reviewed past Medical, Social and Family history today.  Outpatient Medications Prior to Visit  Medication Sig Dispense Refill  . acetaminophen (TYLENOL) 500 MG tablet Take 1,000 mg by mouth 3 (three) times daily as needed for moderate pain or headache.    Marland Kitchen amLODipine (NORVASC) 10 MG tablet Take 1 tablet (10 mg total) by mouth daily. 90 tablet 1  . cetirizine (ZYRTEC ALLERGY) 10 MG tablet Take 1 tablet (10 mg total) by mouth daily. 30 tablet 5  . cloNIDine (CATAPRES) 0.2 MG tablet Take 1 tablet (0.2 mg total) by mouth 2 (two) times daily. Needs office visit with [place] Palmer for additional refills 180 tablet 0  . divalproex (DEPAKOTE) 250 MG DR tablet Take 1 tablet (250 mg total) by mouth 2 (two) times daily. 60 tablet 1  . esomeprazole (NEXIUM) 40 MG capsule TAKE 1 CAPSULE BY MOUTH TWICE DAILY 180  capsule 2  . fluticasone (FLONASE) 50 MCG/ACT nasal spray Place 2 sprays into both nostrils daily.    . furosemide (LASIX) 40 MG tablet Take 1 tablet (40 mg total) by mouth daily.    . Lurasidone HCl 60 MG TABS Take 1 tablet (60 mg total) by mouth daily after supper. 30 tablet 1  . perphenazine (TRILAFON) 4 MG tablet Take 1 tablet (4 mg total) by mouth daily. 30 tablet 1  . potassium chloride SA (K-DUR,KLOR-CON) 20 MEQ tablet Take 2 tablets (40 mEq total) by mouth daily. 180 tablet 2  . Vitamin D, Ergocalciferol, (DRISDOL) 1.25 MG (50000 UT) CAPS capsule TAKE 1 CAPSULE BY MOUTH EVERY 7 DAYS 12 capsule 0  . levothyroxine (SYNTHROID) 75 MCG tablet Take 75 mcg by mouth as directed. 5 days weekly    . lisinopril (ZESTRIL) 40 MG tablet Take 40 mg by mouth daily.    . metFORMIN (GLUCOPHAGE) 500 MG tablet Take 1 tablet (500 mg total) by mouth 2 (two) times daily with a meal. 14 tablet 0  . ibuprofen (ADVIL) 200 MG tablet Take 400 mg by mouth every 6 (six) hours as needed for moderate pain.    Marland Kitchen labetalol (NORMODYNE) 300 MG tablet Take 2 tablets (600 mg total) by mouth 2 (two) times a day. 360 tablet 1   No facility-administered medications prior to visit.     ROS See HPI  Objective:  BP 110/70   Pulse 73   Temp 98 F (36.7 C) (Tympanic)   Ht 5\' 2"  (1.575  m)   Wt (!) 309 lb 3.2 oz (140.3 kg)   LMP 05/02/2017   SpO2 96%   BMI 56.55 kg/m   BP Readings from Last 3 Encounters:  12/24/18 110/70  12/10/18 102/66  09/27/18 (!) 162/93    Wt Readings from Last 3 Encounters:  12/24/18 (!) 309 lb 3.2 oz (140.3 kg)  12/10/18 297 lb (134.7 kg)  10/17/18 (!) 303 lb (137.4 kg)    Physical Exam Neck:     Musculoskeletal: Normal range of motion and neck supple.  Cardiovascular:     Rate and Rhythm: Normal rate and regular rhythm.     Pulses: Normal pulses.     Heart sounds: Normal heart sounds.  Pulmonary:     Effort: Pulmonary effort is normal.     Comments: Diminished lung sounds in  lower lobes due to body habitus Neurological:     Mental Status: She is alert and oriented to person, place, and time.  Psychiatric:        Attention and Perception: Attention normal.        Mood and Affect: Affect is flat.        Speech: Speech normal.        Behavior: Behavior is cooperative.        Cognition and Memory: Cognition and memory normal.    Lab Results  Component Value Date   WBC 5.4 09/25/2018   HGB 12.7 09/25/2018   HCT 40.4 09/25/2018   PLT 229 09/25/2018   GLUCOSE 110 (H) 12/24/2018   CHOL 262 (H) 12/24/2018   TRIG 185 (H) 12/24/2018   HDL 47 (L) 12/24/2018   LDLCALC 181 (H) 12/24/2018   ALT 18 12/24/2018   AST 15 12/24/2018   NA 141 12/24/2018   K 4.3 12/24/2018   CL 103 12/24/2018   CREATININE 1.13 (H) 12/24/2018   BUN 19 12/24/2018   CO2 30 12/24/2018   TSH 1.89 12/24/2018   HGBA1C 5.7 (H) 12/24/2018    Mm 3d Screen Breast Bilateral  Result Date: 12/20/2018 CLINICAL DATA:  Screening. EXAM: DIGITAL SCREENING BILATERAL MAMMOGRAM WITH TOMO AND CAD COMPARISON:  Previous exam(s). ACR Breast Density Category a: The breast tissue is almost entirely fatty. FINDINGS: There are no findings suspicious for malignancy. Images were processed with CAD. IMPRESSION: No mammographic evidence of malignancy. A result letter of this screening mammogram will be mailed directly to the patient. RECOMMENDATION: Screening mammogram in one year. (Code:[id number]-B-01Y) BI-RADS CATEGORY  1: Negative. Electronically Signed   By: Ammie Ferrier M.D.   On: 12/20/2018 09:18    Assessment & Plan:   [place] Palmer was seen today for follow-up.  Diagnoses and all orders for this visit:  Hypertension due to endocrine disorder -     Basic metabolic panel -     lisinopril (ZESTRIL) 40 MG tablet; Take 1 tablet (40 mg total) by mouth daily. -     hydrALAZINE (APRESOLINE) 50 MG tablet; Take 2 tablets (100 mg total) by mouth 2 (two) times daily.  Type 2 diabetes mellitus without complication, without  long-term current use of insulin (HCC) -     Hemoglobin A1c -     Discontinue: metFORMIN (GLUCOPHAGE) 500 MG tablet; Take 1 tablet (500 mg total) by mouth 2 (two) times daily with a meal. -     Hepatic function panel -     metFORMIN (GLUCOPHAGE) 500 MG tablet; Take 1 tablet (500 mg total) by mouth 2 (two) times daily with a meal.  Other specified hypothyroidism -  TSH -     T4, free -     levothyroxine (SYNTHROID) 75 MCG tablet; Take 1 tablet (75 mcg total) by mouth daily before breakfast. Monday-Friday only  Prediabetes  Mixed hyperlipidemia -     Hepatic function panel -     Lipid panel -     fenofibrate (TRICOR) 145 MG tablet; Take 1 tablet (145 mg total) by mouth daily.   I have discontinued Sabrin L. Lienau's hydrALAZINE. I have also changed her lisinopril and levothyroxine. Additionally, I am having her start on hydrALAZINE and fenofibrate. Lastly, I am having her maintain her fluticasone, cetirizine, acetaminophen, potassium chloride SA, ibuprofen, Vitamin D (Ergocalciferol), furosemide, amLODipine, labetalol, esomeprazole, cloNIDine, divalproex, Lurasidone HCl, perphenazine, and metFORMIN.  Meds ordered this encounter  Medications  . DISCONTD: metFORMIN (GLUCOPHAGE) 500 MG tablet    Sig: Take 1 tablet (500 mg total) by mouth 2 (two) times daily with a meal.    Dispense:  14 tablet    Refill:  0    Order Specific Question:   Supervising Provider    Answer:   Lucille Passy [[id number]]  . lisinopril (ZESTRIL) 40 MG tablet    Sig: Take 1 tablet (40 mg total) by mouth daily.    Dispense:  90 tablet    Refill:  1    Order Specific Question:   Supervising Provider    Answer:   Lucille Passy [[id number]]  . hydrALAZINE (APRESOLINE) 50 MG tablet    Sig: Take 2 tablets (100 mg total) by mouth 2 (two) times daily.    Dispense:  120 tablet    Refill:  5    Change in dose    Order Specific Question:   Supervising Provider    Answer:   Lucille Passy [[id number]]  . metFORMIN (GLUCOPHAGE) 500 MG  tablet    Sig: Take 1 tablet (500 mg total) by mouth 2 (two) times daily with a meal.    Dispense:  180 tablet    Refill:  1    Order Specific Question:   Supervising Provider    Answer:   Lucille Passy [[id number]]  . fenofibrate (TRICOR) 145 MG tablet    Sig: Take 1 tablet (145 mg total) by mouth daily.    Dispense:  90 tablet    Refill:  1    Order Specific Question:   Supervising Provider    Answer:   Lucille Passy [[id number]]  . levothyroxine (SYNTHROID) 75 MCG tablet    Sig: Take 1 tablet (75 mcg total) by mouth daily before breakfast. Monday-Friday only    Dispense:  60 tablet    Refill:  1    Order Specific Question:   Supervising Provider    Answer:   Lucille Passy [[id number]]    Problem List Items Addressed This Visit      Cardiovascular and Mediastinum   Hypertension - Primary   Relevant Medications   lisinopril (ZESTRIL) 40 MG tablet   hydrALAZINE (APRESOLINE) 50 MG tablet   fenofibrate (TRICOR) 145 MG tablet   Other Relevant Orders   Basic metabolic panel (Completed)     Endocrine   Hypothyroidism   Relevant Medications   levothyroxine (SYNTHROID) 75 MCG tablet   Other Relevant Orders   TSH (Completed)   T4, free (Completed)     Other   Mixed hyperlipidemia    Abnormal lipid panel. This is due to a combination of diet, lack of exercise and use of antipsychotic medications. Need  to start fenofibrate to help stable lipid panel above and minimize risk for cardiovascular disease. Hx of statin intolerance      Relevant Medications   lisinopril (ZESTRIL) 40 MG tablet   hydrALAZINE (APRESOLINE) 50 MG tablet   fenofibrate (TRICOR) 145 MG tablet   Other Relevant Orders   Hepatic function panel (Completed)   Lipid panel (Completed)   Prediabetes    Other Visit Diagnoses    Type 2 diabetes mellitus without complication, without long-term current use of insulin (HCC)       Relevant Medications   lisinopril (ZESTRIL) 40 MG tablet   metFORMIN (GLUCOPHAGE) 500 MG tablet    Other Relevant Orders   Hemoglobin A1c (Completed)   Hepatic function panel (Completed)       Follow-up: Return in about 3 months (around 03/26/2019) for HTN and DM, hyperlipidemia (fasting).  Wilfred Lacy, NP

## 2018-12-25 ENCOUNTER — Encounter: Payer: Self-pay | Admitting: Nurse Practitioner

## 2018-12-25 LAB — HEPATIC FUNCTION PANEL
AG Ratio: 1.4 (calc) (ref 1.0–2.5)
ALT: 18 U/L (ref 6–29)
AST: 15 U/L (ref 10–35)
Albumin: 4 g/dL (ref 3.6–5.1)
Alkaline phosphatase (APISO): 95 U/L (ref 37–153)
Bilirubin, Direct: 0.1 mg/dL (ref 0.0–0.2)
Globulin: 2.8 g/dL (calc) (ref 1.9–3.7)
Indirect Bilirubin: 0.4 mg/dL (calc) (ref 0.2–1.2)
Total Bilirubin: 0.5 mg/dL (ref 0.2–1.2)
Total Protein: 6.8 g/dL (ref 6.1–8.1)

## 2018-12-25 LAB — BASIC METABOLIC PANEL
BUN/Creatinine Ratio: 17 (calc) (ref 6–22)
BUN: 19 mg/dL (ref 7–25)
CO2: 30 mmol/L (ref 20–32)
Calcium: 10.2 mg/dL (ref 8.6–10.4)
Chloride: 103 mmol/L (ref 98–110)
Creat: 1.13 mg/dL — ABNORMAL HIGH (ref 0.50–1.05)
Glucose, Bld: 110 mg/dL — ABNORMAL HIGH (ref 65–99)
Potassium: 4.3 mmol/L (ref 3.5–5.3)
Sodium: 141 mmol/L (ref 135–146)

## 2018-12-25 LAB — LIPID PANEL
Cholesterol: 262 mg/dL — ABNORMAL HIGH (ref <200)
HDL: 47 mg/dL — ABNORMAL LOW (ref >=50)
LDL Cholesterol (Calc): 181 mg/dL (calc) — ABNORMAL HIGH
Non-HDL Cholesterol (Calc): 215 mg/dL (calc) — ABNORMAL HIGH (ref <130)
Total CHOL/HDL Ratio: 5.6 (calc) — ABNORMAL HIGH (ref <5.0)
Triglycerides: 185 mg/dL — ABNORMAL HIGH (ref <150)

## 2018-12-25 LAB — HEMOGLOBIN A1C
Hgb A1c MFr Bld: 5.7 % of total Hgb — ABNORMAL HIGH (ref <5.7)
Mean Plasma Glucose: 117 (calc)
eAG (mmol/L): 6.5 (calc)

## 2018-12-25 LAB — T4, FREE: Free T4: 1.3 ng/dL (ref 0.8–1.8)

## 2018-12-25 LAB — TSH: TSH: 1.89 mIU/L

## 2018-12-25 MED ORDER — LEVOTHYROXINE SODIUM 75 MCG PO TABS: 75.0000 ug | ORAL_TABLET | Freq: Every day | ORAL | 1 refills | Status: DC

## 2018-12-25 MED ORDER — FENOFIBRATE 145 MG PO TABS: 145.0000 mg | ORAL_TABLET | Freq: Every day | ORAL | 1 refills | Status: DC

## 2018-12-25 NOTE — Assessment & Plan Note (Signed)
Abnormal lipid panel. This is due to a combination of diet, lack of exercise and use of antipsychotic medications. Need to start fenofibrate to help stable lipid panel above and minimize risk for cardiovascular disease. Hx of statin intolerance

## 2018-12-30 ENCOUNTER — Ambulatory Visit (INDEPENDENT_AMBULATORY_CARE_PROVIDER_SITE_OTHER): Payer: BC Managed Care – PPO | Admitting: Family Medicine

## 2018-12-30 ENCOUNTER — Other Ambulatory Visit: Payer: Self-pay

## 2018-12-30 ENCOUNTER — Encounter (INDEPENDENT_AMBULATORY_CARE_PROVIDER_SITE_OTHER): Payer: Self-pay | Admitting: Family Medicine

## 2018-12-30 VITALS — BP 131/80 | HR 87 | Temp 97.9°F | Ht 62.0 in | Wt 297.0 lb

## 2018-12-30 DIAGNOSIS — Z9189 Other specified personal risk factors, not elsewhere classified: Secondary | ICD-10-CM

## 2018-12-30 DIAGNOSIS — Z6841 Body Mass Index (BMI) 40.0 and over, adult: Secondary | ICD-10-CM

## 2018-12-30 DIAGNOSIS — R7303 Prediabetes: Secondary | ICD-10-CM | POA: Diagnosis not present

## 2018-12-30 DIAGNOSIS — E559 Vitamin D deficiency, unspecified: Secondary | ICD-10-CM | POA: Diagnosis not present

## 2018-12-30 DIAGNOSIS — E782 Mixed hyperlipidemia: Secondary | ICD-10-CM

## 2018-12-30 NOTE — Progress Notes (Signed)
Office: [phone]  /  Fax: [phone]   HPI:   Chief Complaint: OBESITY Madison Palmer is here to discuss her progress with her obesity treatment plan. She is on the Category 2 plan and is following her eating plan approximately 80 % of the time. She states she is walking for 15 minutes 30 times per week. Madison Palmer voices the last few weeks has been somewhat stressful as her caregiver is very apprehensive about her eating more to lose weight. She has had to negotiate getting back on the plan secondary to knowing this has worked in the past. She is likely getting snack calories with food prep.  Her weight is 297 lb (134.7 kg) today and has not lost weight since her last visit. She has lost 60 lbs since starting treatment with [place].  Pre-Diabetes Madison Palmer has a diagnosis of pre-diabetes based on her elevated Hgb A1c and was informed this puts her at greater risk of developing diabetes. Last Hgb A1c was of 5.7, no insulin since October 2019. She is taking metformin currently and continues to work on diet and exercise to decrease risk of diabetes. She denies nausea or hypoglycemia.  Hyperlipidemia Madison Palmer has hyperlipidemia and has been trying to improve her cholesterol levels with intensive lifestyle modification including a low saturated fat diet, exercise and weight loss. Her recent FLP was not fasting. She was started on fenofibrate. She denies any chest pain, claudication or myalgias.  Vitamin D Deficiency Madison Palmer has a diagnosis of vitamin D deficiency. She is currently taking prescription Vit D. She notes fatigue and denies nausea, vomiting or muscle weakness.  At risk for osteopenia and osteoporosis Madison Palmer is at higher risk of osteopenia and osteoporosis due to vitamin D deficiency.   ASSESSMENT AND PLAN:  Prediabetes - Plan: Comprehensive metabolic panel, Insulin, random, Vitamin B12  Mixed hyperlipidemia - Plan: Lipid Panel With LDL/HDL Ratio  Vitamin D deficiency - Plan: Vitamin B12,  VITAMIN D 25 Hydroxy (Vit-D Deficiency, Fractures)  At risk for osteoporosis  Class 3 severe obesity with serious comorbidity and body mass index (BMI) of 50.0 to 59.9 in adult, unspecified obesity type Madison Palmer)  PLAN:  Pre-Diabetes Madison Palmer will continue to work on weight loss, exercise, and decreasing simple carbohydrates in her diet to help decrease the risk of diabetes. We dicussed metformin including benefits and risks. She was informed that eating too many simple carbohydrates or too many calories at one sitting increases the likelihood of GI side effects. [name] agrees to continue taking metformin, and we will check insulin, B12 level, and CMP today. [name] agrees to follow up with our clinic in 2 weeks as directed to monitor her progress.  Hyperlipidemia Madison Palmer was informed of the American Heart Association Guidelines emphasizing intensive lifestyle modifications as the first line treatment for hyperlipidemia. We discussed many lifestyle modifications today in depth, and Madison Palmer will continue to work on decreasing saturated fats such as fatty red meat, butter and many fried foods. She will also increase vegetables and lean protein in her diet and continue to work on exercise and weight loss efforts. We will check FLP today. [name] agrees to follow up with our clinic in 2 weeks.  Vitamin D Deficiency Madison Palmer was informed that low vitamin D levels contributes to fatigue and are associated with obesity, breast, and colon cancer. Madison Palmer agrees to continue taking prescription Vit D 50,000 IU every week #4 and we will refill for 1 month. She will follow up for routine testing of vitamin D, at least 2-3 times  per year. She was informed of the risk of over-replacement of vitamin D and agrees to not increase her dose unless she discusses this with [place] first. We will check Vit D level today. Madison Palmer agrees to follow up with our clinic in 2 weeks.  At risk for osteopenia and osteoporosis Madison Palmer was given  extended (15 minutes) osteoporosis prevention counseling today. Madison Palmer is at risk for osteopenia and osteoporsis due to her vitamin D deficiency. She was encouraged to take her vitamin D and follow her higher calcium diet and increase strengthening exercise to help strengthen her bones and decrease her risk of osteopenia and osteoporosis.  Obesity Madison Palmer is currently in the action stage of change. As such, her goal is to continue with weight loss efforts She has agreed to follow the Category 2 plan Madison Palmer has been instructed to work up to a goal of 150 minutes of combined cardio and strengthening exercise per week for weight loss and overall health benefits. We discussed the following Behavioral Modification Strategies today: increasing lean protein intake, increasing vegetables and work on meal planning and easy cooking plans, keeping healthy foods in the home, and planning for success   Madison Palmer has agreed to follow up with our clinic in 2 weeks. She was informed of the importance of frequent follow up visits to maximize her success with intensive lifestyle modifications for her multiple health conditions.  ALLERGIES: Allergies  Allergen Reactions  . Fentanyl Hives  . Levofloxacin Hives  . Midazolam Hives  . Pollen Extract     seasonal  . Atorvastatin     Muscle pain in legs   . Doxycycline Itching and Swelling  . Hydralazine Hcl Other (See Comments)    Hypercalcemia   . Rosuvastatin     Abdominal pain    MEDICATIONS: Current Outpatient Medications on File Prior to Visit  Medication Sig Dispense Refill  . acetaminophen (TYLENOL) 500 MG tablet Take 1,000 mg by mouth 3 (three) times daily as needed for moderate pain or headache.    Marland Kitchen amLODipine (NORVASC) 10 MG tablet Take 1 tablet (10 mg total) by mouth daily. 90 tablet 1  . cetirizine (ZYRTEC ALLERGY) 10 MG tablet Take 1 tablet (10 mg total) by mouth daily. 30 tablet 5  . cloNIDine (CATAPRES) 0.2 MG tablet Take 1 tablet (0.2 mg  total) by mouth 2 (two) times daily. Needs office visit with Dr. Percival Spanish for additional refills 180 tablet 0  . divalproex (DEPAKOTE) 250 MG DR tablet Take 1 tablet (250 mg total) by mouth 2 (two) times daily. 60 tablet 1  . esomeprazole (NEXIUM) 40 MG capsule TAKE 1 CAPSULE BY MOUTH TWICE DAILY 180 capsule 2  . fenofibrate (TRICOR) 145 MG tablet Take 1 tablet (145 mg total) by mouth daily. 90 tablet 1  . fluticasone (FLONASE) 50 MCG/ACT nasal spray Place 2 sprays into both nostrils daily.    . furosemide (LASIX) 40 MG tablet Take 1 tablet (40 mg total) by mouth daily.    . hydrALAZINE (APRESOLINE) 50 MG tablet Take 2 tablets (100 mg total) by mouth 2 (two) times daily. 120 tablet 5  . ibuprofen (ADVIL) 200 MG tablet Take 400 mg by mouth every 6 (six) hours as needed for moderate pain.    Marland Kitchen levothyroxine (SYNTHROID) 75 MCG tablet Take 1 tablet (75 mcg total) by mouth daily before breakfast. Monday-Friday only 60 tablet 1  . lisinopril (ZESTRIL) 40 MG tablet Take 1 tablet (40 mg total) by mouth daily. 90 tablet 1  .  Lurasidone HCl 60 MG TABS Take 1 tablet (60 mg total) by mouth daily after supper. 30 tablet 1  . metFORMIN (GLUCOPHAGE) 500 MG tablet Take 1 tablet (500 mg total) by mouth 2 (two) times daily with a meal. 180 tablet 1  . perphenazine (TRILAFON) 4 MG tablet Take 1 tablet (4 mg total) by mouth daily. 30 tablet 1  . potassium chloride SA (K-DUR,KLOR-CON) 20 MEQ tablet Take 2 tablets (40 mEq total) by mouth daily. 180 tablet 2  . labetalol (NORMODYNE) 300 MG tablet Take 2 tablets (600 mg total) by mouth 2 (two) times a day. 360 tablet 1  . Vitamin D, Ergocalciferol, (DRISDOL) 1.25 MG (50000 UT) CAPS capsule TAKE 1 CAPSULE BY MOUTH EVERY 7 DAYS (Patient not taking: Reported on 12/30/2018) 12 capsule 0   No current facility-administered medications on file prior to visit.     PAST MEDICAL HISTORY: Past Medical History:  Diagnosis Date  . Anemia   . Anxiety   . Arrhythmia     tachycardia  . Arthritis   . Chickenpox   . Depression   . Diverticulitis   . GERD (gastroesophageal reflux disease)   . Glaucoma   . Hyperlipidemia   . Hyperparathyroidism ([place])   . Hypertension   . Inflammatory polyps of colon ([place])   . LVH (left ventricular hypertrophy)   . Lymphedema   . PCOS (polycystic ovarian syndrome)   . Prediabetes   . Recurrent UTI   . Sleep apnea    CPAP  . Thyroid disease   . Vitamin D deficiency     PAST SURGICAL HISTORY: Past Surgical History:  Procedure Laterality Date  . BREAST BIOPSY  2015  . CESAREAN SECTION  2004  . INNER EAR SURGERY     ear and sinus surgery  . LAPAROSCOPIC REPAIR AND REMOVAL OF GASTRIC BAND    . OOPHORECTOMY Left   . TONSILLECTOMY AND ADENOIDECTOMY      SOCIAL HISTORY: Social History   Tobacco Use  . Smoking status: Never Smoker  . Smokeless tobacco: Never Used  Substance Use Topics  . Alcohol use: Yes    Comment: social  . Drug use: No    FAMILY HISTORY: Family History  Adopted: Yes  Problem Relation Age of Onset  . Hearing loss Son        right   . Healthy Son     ROS: Review of Systems  Constitutional: Positive for malaise/fatigue. Negative for weight loss.  Cardiovascular: Negative for chest pain and claudication.  Gastrointestinal: Negative for nausea and vomiting.  Musculoskeletal: Negative for myalgias.       Negative muscle weakness  Endo/Heme/Allergies:       Negative hypoglycemia    PHYSICAL EXAM: Blood pressure 131/80, pulse 87, temperature 97.9 F (36.6 C), temperature source Oral, height 5\' 2"  (1.575 m), weight 297 lb (134.7 kg), last menstrual period 05/02/2017, SpO2 94 %. Body mass index is 54.32 kg/m. Physical Exam Vitals signs reviewed.  Constitutional:      Appearance: Normal appearance. She is obese.  Cardiovascular:     Rate and Rhythm: Normal rate.     Pulses: Normal pulses.  Pulmonary:     Effort: Pulmonary effort is normal.     Breath sounds: Normal breath  sounds.  Musculoskeletal: Normal range of motion.  Skin:    General: Skin is warm and dry.  Neurological:     Mental Status: She is alert and oriented to person, place, and time.  Psychiatric:  Mood and Affect: Mood normal.        Behavior: Behavior normal.     RECENT LABS AND TESTS: BMET    Component Value Date/Time   NA 141 12/24/2018 1402   NA 143 07/08/2018 1359   K 4.3 12/24/2018 1402   CL 103 12/24/2018 1402   CO2 30 12/24/2018 1402   GLUCOSE 110 (H) 12/24/2018 1402   BUN 19 12/24/2018 1402   BUN 30 (H) 07/08/2018 1359   CREATININE 1.13 (H) 12/24/2018 1402   CALCIUM 10.2 12/24/2018 1402   GFRNONAA >60 09/25/2018 0313   GFRAA >60 09/25/2018 0313   Lab Results  Component Value Date   HGBA1C 5.7 (H) 12/24/2018   HGBA1C 6.0 (H) 02/27/2018   HGBA1C 5.7 09/10/2017   HGBA1C 5.9 05/29/2017   Lab Results  Component Value Date   INSULIN 9.6 02/27/2018   CBC    Component Value Date/Time   WBC 5.4 09/25/2018 0313   RBC 4.27 09/25/2018 0313   HGB 12.7 09/25/2018 0313   HCT 40.4 09/25/2018 0313   PLT 229 09/25/2018 0313   MCV 94.6 09/25/2018 0313   MCH 29.7 09/25/2018 0313   MCHC 31.4 09/25/2018 0313   RDW 14.2 09/25/2018 0313   LYMPHSABS 1.1 09/25/2018 0313   MONOABS 0.6 09/25/2018 0313   EOSABS 0.1 09/25/2018 0313   BASOSABS 0.0 09/25/2018 0313   Iron/TIBC/Ferritin/ %Sat    Component Value Date/Time   IRON 44 02/20/2018 1536   IRON 81 05/29/2017   TIBC 347 05/29/2017   IRONPCTSAT 8.8 (L) 02/20/2018 1536   Lipid Panel     Component Value Date/Time   CHOL 262 (H) 12/24/2018 1402   CHOL 196 02/27/2018 1233   TRIG 185 (H) 12/24/2018 1402   HDL 47 (L) 12/24/2018 1402   HDL 46 02/27/2018 1233   CHOLHDL 5.6 (H) 12/24/2018 1402   VLDL 26.6 09/10/2017 1156   LDLCALC 181 (H) 12/24/2018 1402   Hepatic Function Panel     Component Value Date/Time   PROT 6.8 12/24/2018 1402   PROT 6.9 02/27/2018 1233   ALBUMIN 3.3 (L) 09/15/2018 0918   ALBUMIN 4.3  02/27/2018 1233   AST 15 12/24/2018 1402   ALT 18 12/24/2018 1402   ALKPHOS 73 09/15/2018 0918   BILITOT 0.5 12/24/2018 1402   BILITOT 0.9 02/27/2018 1233   BILIDIR 0.1 12/24/2018 1402   IBILI 0.4 12/24/2018 1402      Component Value Date/Time   TSH 1.89 12/24/2018 1402   TSH 0.409 09/16/2018 0301   TSH 0.938 05/11/2018 0630   TSH 1.450 02/27/2018 1233   TSH 0.97 12/11/2017 1158      OBESITY BEHAVIORAL INTERVENTION VISIT  Today's visit was # 15   Starting weight: 357 lbs Starting date: 02/27/18 Today's weight : 297 lbs Today's date: 12/30/2018 Total lbs lost to date: 61    ASK: We discussed the diagnosis of obesity with Madison Palmer today and Madison Palmer agreed to give [place] permission to discuss obesity behavioral modification therapy today.  ASSESS: Madison Palmer has the diagnosis of obesity and her BMI today is 54.31 Madison Palmer is in the action stage of change   ADVISE: Madison Palmer was educated on the multiple health risks of obesity as well as the benefit of weight loss to improve her health. She was advised of the need for long term treatment and the importance of lifestyle modifications to improve her current health and to decrease her risk of future health problems.  AGREE: Multiple dietary modification options  and treatment options were discussed and  Madison Palmer agreed to follow the recommendations documented in the above note.  ARRANGE: Madison Palmer was educated on the importance of frequent visits to treat obesity as outlined per CMS and USPSTF guidelines and agreed to schedule her next follow up appointment today.  I, Trixie Dredge, am acting as transcriptionist for Ilene Qua, MD  I have reviewed the above documentation for accuracy and completeness, and I agree with the above. - Ilene Qua, MD

## 2018-12-31 LAB — COMPREHENSIVE METABOLIC PANEL
ALT: 16 IU/L (ref 0–32)
AST: 13 IU/L (ref 0–40)
Albumin/Globulin Ratio: 1.6 (ref 1.2–2.2)
Albumin: 4.1 g/dL (ref 3.8–4.9)
Alkaline Phosphatase: 102 IU/L (ref 39–117)
BUN/Creatinine Ratio: 18 (ref 9–23)
BUN: 23 mg/dL (ref 6–24)
Bilirubin Total: 0.7 mg/dL (ref 0.0–1.2)
CO2: 30 mmol/L — ABNORMAL HIGH (ref 20–29)
Calcium: 10.5 mg/dL — ABNORMAL HIGH (ref 8.7–10.2)
Chloride: 100 mmol/L (ref 96–106)
Creatinine, Ser: 1.3 mg/dL — ABNORMAL HIGH (ref 0.57–1.00)
GFR calc Af Amer: 55 mL/min/{1.73_m2} — ABNORMAL LOW (ref >59)
GFR calc non Af Amer: 47 mL/min/{1.73_m2} — ABNORMAL LOW (ref >59)
Globulin, Total: 2.5 g/dL (ref 1.5–4.5)
Glucose: 128 mg/dL — ABNORMAL HIGH (ref 65–99)
Potassium: 4.8 mmol/L (ref 3.5–5.2)
Sodium: 144 mmol/L (ref 134–144)
Total Protein: 6.6 g/dL (ref 6.0–8.5)

## 2018-12-31 LAB — LIPID PANEL WITH LDL/HDL RATIO
Cholesterol, Total: 255 mg/dL — ABNORMAL HIGH (ref 100–199)
HDL: 50 mg/dL
LDL Chol Calc (NIH): 186 mg/dL — ABNORMAL HIGH (ref 0–99)
LDL/HDL Ratio: 3.7 ratio — ABNORMAL HIGH (ref 0.0–3.2)
Triglycerides: 106 mg/dL (ref 0–149)
VLDL Cholesterol Cal: 19 mg/dL (ref 5–40)

## 2018-12-31 LAB — VITAMIN D 25 HYDROXY (VIT D DEFICIENCY, FRACTURES): Vit D, 25-Hydroxy: 36.5 ng/mL (ref 30.0–100.0)

## 2018-12-31 LAB — INSULIN, RANDOM: INSULIN: 21.1 u[IU]/mL (ref 2.6–24.9)

## 2018-12-31 LAB — VITAMIN B12: Vitamin B-12: 489 pg/mL (ref 232–1245)

## 2019-01-01 ENCOUNTER — Encounter (INDEPENDENT_AMBULATORY_CARE_PROVIDER_SITE_OTHER): Payer: Self-pay | Admitting: Family Medicine

## 2019-01-01 NOTE — Telephone Encounter (Signed)
Please advise 

## 2019-01-13 ENCOUNTER — Other Ambulatory Visit: Payer: Self-pay

## 2019-01-13 ENCOUNTER — Encounter (INDEPENDENT_AMBULATORY_CARE_PROVIDER_SITE_OTHER): Payer: Self-pay | Admitting: Family Medicine

## 2019-01-13 ENCOUNTER — Ambulatory Visit (INDEPENDENT_AMBULATORY_CARE_PROVIDER_SITE_OTHER): Payer: BC Managed Care – PPO | Admitting: Family Medicine

## 2019-01-13 VITALS — BP 105/68 | HR 79 | Temp 97.7°F | Ht 62.0 in | Wt 294.0 lb

## 2019-01-13 DIAGNOSIS — R7303 Prediabetes: Secondary | ICD-10-CM

## 2019-01-13 DIAGNOSIS — E559 Vitamin D deficiency, unspecified: Secondary | ICD-10-CM

## 2019-01-13 DIAGNOSIS — Z6841 Body Mass Index (BMI) 40.0 and over, adult: Secondary | ICD-10-CM

## 2019-01-13 NOTE — Progress Notes (Signed)
Office: [phone]  /  Fax: [phone]   HPI:   Chief Complaint: OBESITY Madison Palmer is here to discuss her progress with her obesity treatment plan. She is on the Category 2 plan and is following her eating plan approximately 90 % of the time. She states she is walking for 15-20 minutes 3 times per week. Madison Palmer has rejoined [facility] to restart water exercises and is hopeful to do so this week. She denies hunger. She is getting all food in constantly but is using snack calories as additions to meals.  Her weight is 294 lb (133.4 kg) today and has had a weight loss of 3 pounds over a period of 2 weeks since her last visit. She has lost 63 lbs since starting treatment with [place].  Pre-Diabetes Madison Palmer has a diagnosis of pre-diabetes based on her elevated Hgb A1c and was informed this puts her at greater risk of developing diabetes. Last Hgb A1c has improved from 6.0 to 5.7. She is taking metformin currently and denies GI side effects. She continues to work on diet and exercise to decrease risk of diabetes. She denies nausea or hypoglycemia.  Vitamin D Deficiency Madison Palmer has a diagnosis of vitamin D deficiency. She notes fatigue and denies nausea, vomiting or muscle weakness. She was told to stop Vit D and calcium by her Nephrologist.   ASSESSMENT AND PLAN:  Prediabetes  Vitamin D deficiency  Class 3 severe obesity with serious comorbidity and body mass index (BMI) of 50.0 to 59.9 in adult, unspecified obesity type [facility])  PLAN:  Pre-Diabetes [name] will continue to work on weight loss, exercise, and decreasing simple carbohydrates in her diet to help decrease the risk of diabetes. We dicussed metformin including benefits and risks. She was informed that eating too many simple carbohydrates or too many calories at one sitting increases the likelihood of GI side effects. [name] agrees to continue taking metformin, no refill needed. [name] agrees to follow up with our clinic in 2 to 3 weeks as  directed to monitor her progress.  Vitamin D Deficiency [name] was informed that low vitamin D levels contributes to fatigue and are associated with obesity, breast, and colon cancer. She will follow up for routine testing of vitamin D, at least 2-3 times per year. She was informed of the risk of over-replacement of vitamin D and agrees to not increase her dose unless she discusses this with [place] first. We will repeat Vit D level in 3 months.  I spent > than 50% of the 15 minute visit on counseling as documented in the note.  Obesity Madison Palmer is currently in the action stage of change. As such, her goal is to continue with weight loss efforts She has agreed to follow the Category 2 plan with Category 3 with breakfast 3 days per week. Madison Palmer has been instructed to work up to a goal of 150 minutes of combined cardio and strengthening exercise per week for weight loss and overall health benefits. We discussed the following Behavioral Modification Strategies today: increasing lean protein intake, increasing vegetables and work on meal planning and easy cooking plans, better snacking choices, and planning for success   Madison Palmer has agreed to follow up with our clinic in 2 to 3 weeks. She was informed of the importance of frequent follow up visits to maximize her success with intensive lifestyle modifications for her multiple health conditions.  ALLERGIES: Allergies  Allergen Reactions   Fentanyl Hives   Levofloxacin Hives   Midazolam Hives   Pollen  Extract     seasonal   Atorvastatin     Muscle pain in legs    Doxycycline Itching and Swelling   Hydralazine Hcl Other (See Comments)    Hypercalcemia    Rosuvastatin     Abdominal pain    MEDICATIONS: Current Outpatient Medications on File Prior to Visit  Medication Sig Dispense Refill   acetaminophen (TYLENOL) 500 MG tablet Take 1,000 mg by mouth 3 (three) times daily as needed for moderate pain or headache.     amLODipine (NORVASC)  10 MG tablet Take 1 tablet (10 mg total) by mouth daily. 90 tablet 1   cetirizine (ZYRTEC ALLERGY) 10 MG tablet Take 1 tablet (10 mg total) by mouth daily. 30 tablet 5   cloNIDine (CATAPRES) 0.2 MG tablet Take 1 tablet (0.2 mg total) by mouth 2 (two) times daily. Needs office visit with Dr. Percival Spanish for additional refills 180 tablet 0   divalproex (DEPAKOTE) 250 MG DR tablet Take 1 tablet (250 mg total) by mouth 2 (two) times daily. 60 tablet 1   esomeprazole (NEXIUM) 40 MG capsule TAKE 1 CAPSULE BY MOUTH TWICE DAILY 180 capsule 2   fenofibrate (TRICOR) 145 MG tablet Take 1 tablet (145 mg total) by mouth daily. 90 tablet 1   fluticasone (FLONASE) 50 MCG/ACT nasal spray Place 2 sprays into both nostrils daily.     furosemide (LASIX) 40 MG tablet Take 1 tablet (40 mg total) by mouth daily.     hydrALAZINE (APRESOLINE) 50 MG tablet Take 2 tablets (100 mg total) by mouth 2 (two) times daily. 120 tablet 5   ibuprofen (ADVIL) 200 MG tablet Take 400 mg by mouth every 6 (six) hours as needed for moderate pain.     levothyroxine (SYNTHROID) 75 MCG tablet Take 1 tablet (75 mcg total) by mouth daily before breakfast. Monday-Friday only 60 tablet 1   lisinopril (ZESTRIL) 40 MG tablet Take 1 tablet (40 mg total) by mouth daily. 90 tablet 1   Lurasidone HCl 60 MG TABS Take 1 tablet (60 mg total) by mouth daily after supper. 30 tablet 1   metFORMIN (GLUCOPHAGE) 500 MG tablet Take 1 tablet (500 mg total) by mouth 2 (two) times daily with a meal. 180 tablet 1   perphenazine (TRILAFON) 4 MG tablet Take 1 tablet (4 mg total) by mouth daily. 30 tablet 1   potassium chloride SA (K-DUR,KLOR-CON) 20 MEQ tablet Take 2 tablets (40 mEq total) by mouth daily. 180 tablet 2   Vitamin D, Ergocalciferol, (DRISDOL) 1.25 MG (50000 UT) CAPS capsule TAKE 1 CAPSULE BY MOUTH EVERY 7 DAYS 12 capsule 0   labetalol (NORMODYNE) 300 MG tablet Take 2 tablets (600 mg total) by mouth 2 (two) times a day. 360 tablet 1   No  current facility-administered medications on file prior to visit.     PAST MEDICAL HISTORY: Past Medical History:  Diagnosis Date   Anemia    Anxiety    Arrhythmia    tachycardia   Arthritis    Chickenpox    Depression    Diverticulitis    GERD (gastroesophageal reflux disease)    Glaucoma    Hyperlipidemia    Hyperparathyroidism ([place])    Hypertension    Inflammatory polyps of colon (HCC)    LVH (left ventricular hypertrophy)    Lymphedema    PCOS (polycystic ovarian syndrome)    Prediabetes    Recurrent UTI    Sleep apnea    CPAP   Thyroid disease  Vitamin D deficiency     PAST SURGICAL HISTORY: Past Surgical History:  Procedure Laterality Date   BREAST BIOPSY  2015   CESAREAN SECTION  2004   INNER EAR SURGERY     ear and sinus surgery   LAPAROSCOPIC REPAIR AND REMOVAL OF GASTRIC BAND     OOPHORECTOMY Left    TONSILLECTOMY AND ADENOIDECTOMY      SOCIAL HISTORY: Social History   Tobacco Use   Smoking status: Never Smoker   Smokeless tobacco: Never Used  Substance Use Topics   Alcohol use: Yes    Comment: social   Drug use: No    FAMILY HISTORY: Family History  Adopted: Yes  Problem Relation Age of Onset   Hearing loss Son        right    Healthy Son     ROS: Review of Systems  Constitutional: Positive for malaise/fatigue and weight loss.  Gastrointestinal: Negative for nausea and vomiting.  Musculoskeletal:       Negative muscle weakness    PHYSICAL EXAM: Blood pressure 105/68, pulse 79, temperature 97.7 F (36.5 C), temperature source Oral, height 5\' 2"  (1.575 m), weight 294 lb (133.4 kg), last menstrual period 05/02/2017, SpO2 97 %. Body mass index is 53.77 kg/m. Physical Exam Vitals signs reviewed.  Constitutional:      Appearance: Normal appearance. She is obese.  Cardiovascular:     Rate and Rhythm: Normal rate.     Pulses: Normal pulses.  Pulmonary:     Effort: Pulmonary effort is normal.      Breath sounds: Normal breath sounds.  Musculoskeletal: Normal range of motion.  Skin:    General: Skin is warm and dry.  Neurological:     Mental Status: She is alert and oriented to person, place, and time.  Psychiatric:        Mood and Affect: Mood normal.        Behavior: Behavior normal.     RECENT LABS AND TESTS: BMET    Component Value Date/Time   NA 144 12/30/2018 1022   K 4.8 12/30/2018 1022   CL 100 12/30/2018 1022   CO2 30 (H) 12/30/2018 1022   GLUCOSE 128 (H) 12/30/2018 1022   GLUCOSE 110 (H) 12/24/2018 1402   BUN 23 12/30/2018 1022   CREATININE 1.30 (H) 12/30/2018 1022   CREATININE 1.13 (H) 12/24/2018 1402   CALCIUM 10.5 (H) 12/30/2018 1022   GFRNONAA 47 (L) 12/30/2018 1022   GFRAA 55 (L) 12/30/2018 1022   Lab Results  Component Value Date   HGBA1C 5.7 (H) 12/24/2018   HGBA1C 6.0 (H) 02/27/2018   HGBA1C 5.7 09/10/2017   HGBA1C 5.9 05/29/2017   Lab Results  Component Value Date   INSULIN 21.1 12/30/2018   INSULIN 9.6 02/27/2018   CBC    Component Value Date/Time   WBC 5.4 09/25/2018 0313   RBC 4.27 09/25/2018 0313   HGB 12.7 09/25/2018 0313   HCT 40.4 09/25/2018 0313   PLT 229 09/25/2018 0313   MCV 94.6 09/25/2018 0313   MCH 29.7 09/25/2018 0313   MCHC 31.4 09/25/2018 0313   RDW 14.2 09/25/2018 0313   LYMPHSABS 1.1 09/25/2018 0313   MONOABS 0.6 09/25/2018 0313   EOSABS 0.1 09/25/2018 0313   BASOSABS 0.0 09/25/2018 0313   Iron/TIBC/Ferritin/ %Sat    Component Value Date/Time   IRON 44 02/20/2018 1536   IRON 81 05/29/2017   TIBC 347 05/29/2017   IRONPCTSAT 8.8 (L) 02/20/2018 1536   Lipid Panel  Component Value Date/Time   CHOL 255 (H) 12/30/2018 1022   TRIG 106 12/30/2018 1022   HDL 50 12/30/2018 1022   CHOLHDL 5.6 (H) 12/24/2018 1402   VLDL 26.6 09/10/2017 1156   LDLCALC 181 (H) 12/24/2018 1402   Hepatic Function Panel     Component Value Date/Time   PROT 6.6 12/30/2018 1022   ALBUMIN 4.1 12/30/2018 1022   AST 13  12/30/2018 1022   ALT 16 12/30/2018 1022   ALKPHOS 102 12/30/2018 1022   BILITOT 0.7 12/30/2018 1022   BILIDIR 0.1 12/24/2018 1402   IBILI 0.4 12/24/2018 1402      Component Value Date/Time   TSH 1.89 12/24/2018 1402   TSH 0.409 09/16/2018 0301   TSH 0.938 05/11/2018 0630   TSH 1.450 02/27/2018 1233   TSH 0.97 12/11/2017 1158      OBESITY BEHAVIORAL INTERVENTION VISIT  Today's visit was # 16   Starting weight: 357 lbs Starting date: 02/27/18 Today's weight : 294 lbs  Today's date: 01/13/2019 Total lbs lost to date: 68    ASK: We discussed the diagnosis of obesity with [name] today and Twana agreed to give [place] permission to discuss obesity behavioral modification therapy today.  ASSESS: Maycie has the diagnosis of obesity and her BMI today is 53.76 Shandrea is in the action stage of change   ADVISE: Maybel was educated on the multiple health risks of obesity as well as the benefit of weight loss to improve her health. She was advised of the need for long term treatment and the importance of lifestyle modifications to improve her current health and to decrease her risk of future health problems.  AGREE: Multiple dietary modification options and treatment options were discussed and  Chalon agreed to follow the recommendations documented in the above note.  ARRANGE: Alleyne was educated on the importance of frequent visits to treat obesity as outlined per CMS and USPSTF guidelines and agreed to schedule her next follow up appointment today.  I, Trixie Dredge, am acting as transcriptionist for Ilene Qua, MD  I have reviewed the above documentation for accuracy and completeness, and I agree with the above. - Ilene Qua, MD

## 2019-01-14 DIAGNOSIS — M7989 Other specified soft tissue disorders: Secondary | ICD-10-CM

## 2019-01-14 DIAGNOSIS — R931 Abnormal findings on diagnostic imaging of heart and coronary circulation: Secondary | ICD-10-CM | POA: Insufficient documentation

## 2019-01-14 HISTORY — DX: Other specified soft tissue disorders: M79.89

## 2019-01-14 NOTE — Progress Notes (Signed)
Cardiology Office Note   Date:  01/17/2019   ID:  Madison Palmer, DOB 02-Sep-1965, MRN [id number]  PCP:  Flossie Buffy, NP  Cardiologist:   Minus Breeding, MD   Chief Complaint  Patient presents with  . Shortness of Breath      History of Present Illness: Madison Palmer is a 53 y.o. female who presents for follow up of dyspnea.   She had an echo in Nov with a normal EF of 60 - 65% with severe concentric LVH.   She also is noted to have a past history of difficult to control hypertension.  She moved here from [place].  She has had LVH with preserved ejection fraction.  She had some mild internal carotid artery plaque noted on Doppler in the past.  She had a perfusion study in 2018 that demonstrated no evidence of ischemia or infarct.  She is been managed for her hypertension.   Since I last saw her she was in the hospital for management of her bipolar disorder, HTN and acute on chronic respiratory distress in part related to acute diastolic HF.    Since I last saw her she has been living at home with a healthcare provider who is been managing her medicines.  She has been compliant with weights and diet.  She is limited in walking because of her weightbearing knee pain but she does get around with a cane.  She goes to grocery store.  She is now started going to the [facility] and doing some water aerobics.  She denies any chest pressure, neck or arm discomfort.  She says she is not short of breath, having PND or orthopnea.   Past Medical History:  Diagnosis Date  . Anemia   . Anxiety   . Arrhythmia    tachycardia  . Arthritis   . Chickenpox   . Depression   . Diverticulitis   . GERD (gastroesophageal reflux disease)   . Glaucoma   . Hyperlipidemia   . Hyperparathyroidism ([place])   . Hypertension   . Inflammatory polyps of colon ([place])   . LVH (left ventricular hypertrophy)   . Lymphedema   . PCOS (polycystic ovarian syndrome)   . Prediabetes   . Recurrent UTI   . Sleep  apnea    CPAP  . Thyroid disease   . Vitamin D deficiency     Past Surgical History:  Procedure Laterality Date  . BREAST BIOPSY  2015  . CESAREAN SECTION  2004  . INNER EAR SURGERY     ear and sinus surgery  . LAPAROSCOPIC REPAIR AND REMOVAL OF GASTRIC BAND    . OOPHORECTOMY Left   . TONSILLECTOMY AND ADENOIDECTOMY       Current Outpatient Medications  Medication Sig Dispense Refill  . acetaminophen (TYLENOL) 500 MG tablet Take 1,000 mg by mouth 3 (three) times daily as needed for moderate pain or headache.    Marland Kitchen amLODipine (NORVASC) 10 MG tablet Take 1 tablet (10 mg total) by mouth daily. 90 tablet 1  . cetirizine (ZYRTEC ALLERGY) 10 MG tablet Take 1 tablet (10 mg total) by mouth daily. 30 tablet 5  . cloNIDine (CATAPRES) 0.2 MG tablet Take 1 tablet (0.2 mg total) by mouth 2 (two) times daily. Needs office visit with Dr. Percival Spanish for additional refills 180 tablet 0  . divalproex (DEPAKOTE) 250 MG DR tablet Take 1 tablet (250 mg total) by mouth 2 (two) times daily. 60 tablet 1  . esomeprazole ([place])  40 MG capsule TAKE 1 CAPSULE BY MOUTH TWICE DAILY 180 capsule 2  . fenofibrate (TRICOR) 145 MG tablet Take 1 tablet (145 mg total) by mouth daily. 90 tablet 1  . fluticasone (FLONASE) 50 MCG/ACT nasal spray Place 2 sprays into both nostrils daily.    . furosemide (LASIX) 40 MG tablet Take 1 tablet (40 mg total) by mouth daily.    . hydrALAZINE (APRESOLINE) 50 MG tablet Take 2 tablets (100 mg total) by mouth 2 (two) times daily. 120 tablet 5  . labetalol (NORMODYNE) 300 MG tablet Take 2 tablets (600 mg total) by mouth 2 (two) times a day. 360 tablet 1  . levothyroxine (SYNTHROID) 75 MCG tablet Take 1 tablet (75 mcg total) by mouth daily before breakfast. Monday-Friday only 60 tablet 1  . lisinopril (ZESTRIL) 40 MG tablet Take 1 tablet (40 mg total) by mouth daily. 90 tablet 1  . Lurasidone HCl 60 MG TABS Take 1 tablet (60 mg total) by mouth daily after supper. 30 tablet 1  . metFORMIN  (GLUCOPHAGE) 500 MG tablet Take 1 tablet (500 mg total) by mouth 2 (two) times daily with a meal. 180 tablet 1  . perphenazine (TRILAFON) 4 MG tablet Take 1 tablet (4 mg total) by mouth daily. 30 tablet 1  . potassium chloride SA (K-DUR,KLOR-CON) 20 MEQ tablet Take 2 tablets (40 mEq total) by mouth daily. 180 tablet 2  . ibuprofen (ADVIL) 200 MG tablet Take 400 mg by mouth every 6 (six) hours as needed for moderate pain.    . Vitamin D, Ergocalciferol, (DRISDOL) 1.25 MG (50000 UT) CAPS capsule TAKE 1 CAPSULE BY MOUTH EVERY 7 DAYS (Patient not taking: Reported on 01/16/2019) 12 capsule 0   No current facility-administered medications for this visit.     Allergies:   Fentanyl, Levofloxacin, Midazolam, Pollen extract, Atorvastatin, Doxycycline, Hydralazine hcl, and Rosuvastatin    ROS:  Please see the history of present illness.   Otherwise, review of systems are positive for none.   All other systems are reviewed and negative.    PHYSICAL EXAM: VS:  BP 126/74   Pulse 83   Temp (!) 97.2 F (36.2 C)   Ht 5\' 2"  (1.575 m)   Wt 297 lb (134.7 kg)   LMP 05/02/2017   SpO2 95%   BMI 54.32 kg/m  , BMI Body mass index is 54.32 kg/m. GENERAL:  Well appearing NECK:  No jugular venous distention, waveform within normal limits, carotid upstroke brisk and symmetric, no bruits, no thyromegaly LUNGS:  Clear to auscultation bilaterally CHEST:  Unremarkable HEART:  PMI not displaced or sustained,S1 and S2 within normal limits, no S3, no S4, no clicks, no rubs, no murmurs ABD:  Flat, positive bowel sounds normal in frequency in pitch, no bruits, no rebound, no guarding, no midline pulsatile mass, no hepatomegaly, no splenomegaly EXT:  2 plus pulses throughout, no edema, no cyanosis no clubbing    EKG:  EKG is not ordered today.   Recent Labs: 02/20/2018: Pro B Natriuretic peptide (BNP) 173.0 09/15/2018: B Natriuretic Peptide 147.2 09/25/2018: Hemoglobin 12.7; Magnesium 2.1; Platelets 229 12/24/2018:  TSH 1.89 12/30/2018: ALT 16; BUN 23; Creatinine, Ser 1.30; Potassium 4.8; Sodium 144    Lipid Panel    Component Value Date/Time   CHOL 255 (H) 12/30/2018 1022   TRIG 106 12/30/2018 1022   HDL 50 12/30/2018 1022   CHOLHDL 5.6 (H) 12/24/2018 1402   VLDL 26.6 09/10/2017 1156   LDLCALC 181 (H) 12/24/2018 1402  Wt Readings from Last 3 Encounters:  01/16/19 297 lb (134.7 kg)  01/15/19 296 lb 3.2 oz (134.4 kg)  01/13/19 294 lb (133.4 kg)      Other studies Reviewed: Additional studies/ records that were reviewed today include: Hospital records. Review of the above records demonstrates:  Please see elsewhere in the note.     ASSESSMENT AND PLAN:  HTN: Her blood pressure is not perfect but much better than previous.  She is compliant with BID meds and I would prefer not to try to increase this.  No change in therapy.  I think she will get closer to target with more weight loss.    PULMONARY NODULES: She and her pulmonologist have decided no biopsy.    LVH:PYP scan was negative.  It is likely that the LVH is related to the blood pressure.  No change in therapy  EDEMA:     This is much improved.  Continue current meds.  I do note that her creatinine was slightly up at the end of last month to 1.3.  This should be repeated when she comes back.    CHRONICDIASTOLIC HF:   She seems to be euvolemic.  No change in therapy.   OBESITY:  Very proud of her weight loss and encouraged more of the same.  CORONARY CALCIUM: This was present on PET scanning.   She has no anginal symptoms.    No further work-up at this time.  SLEEP APNEA:   She wears CPAP.  She is compliant with her CPAP.  CAROTID STENOSIS :   She had very mild plaque earlier this year.  No follow up imaging at this time.      Current medicines are reviewed at length with the patient today.  The patient does not have concerns regarding medicines.  The following changes have been made:  no change   Labs/ tests ordered today include: None No orders of the defined types were placed in this encounter.    Disposition:   FU with Jory Sims DNP in Jan.     Signed, Minus Breeding, MD  01/17/2019 9:01 AM    [place]

## 2019-01-15 ENCOUNTER — Other Ambulatory Visit: Payer: Self-pay

## 2019-01-15 ENCOUNTER — Encounter: Payer: Self-pay | Admitting: Primary Care

## 2019-01-15 ENCOUNTER — Ambulatory Visit (INDEPENDENT_AMBULATORY_CARE_PROVIDER_SITE_OTHER): Payer: BC Managed Care – PPO | Admitting: Primary Care

## 2019-01-15 VITALS — BP 114/70 | HR 72 | Temp 97.0°F | Ht 62.0 in | Wt 296.2 lb

## 2019-01-15 DIAGNOSIS — J9611 Chronic respiratory failure with hypoxia: Secondary | ICD-10-CM | POA: Diagnosis not present

## 2019-01-15 DIAGNOSIS — R911 Solitary pulmonary nodule: Secondary | ICD-10-CM | POA: Diagnosis not present

## 2019-01-15 DIAGNOSIS — D869 Sarcoidosis, unspecified: Secondary | ICD-10-CM

## 2019-01-15 DIAGNOSIS — G4733 Obstructive sleep apnea (adult) (pediatric): Secondary | ICD-10-CM

## 2019-01-15 DIAGNOSIS — R918 Other nonspecific abnormal finding of lung field: Secondary | ICD-10-CM

## 2019-01-15 DIAGNOSIS — Z9989 Dependence on other enabling machines and devices: Secondary | ICD-10-CM

## 2019-01-15 NOTE — Assessment & Plan Note (Signed)
-   Favored to be benign etiology, presumed sarcoid  - No cough, sob or chest pain - Completed prednisone course - Needs repeat CT chest wo contrast

## 2019-01-15 NOTE — Patient Instructions (Addendum)
Check ONO on CPAP  Needs CT chest wo contrast in 2-4 weeks re: pulmonary nodules   If you develop increased shortness of breath, chest pain or cough notify office   Follow-up 4-6 months with Dr. Elsworth Soho  Sarcoidosis  Sarcoidosis is a disease that can cause inflammation in many areas of the body. It most often affects the lungs (pulmonary sarcoidosis). Sarcoidosis can also affect the lymph nodes, liver, eyes, skin, heart, or any other body tissue. Normally, cells that are part of your body's disease-fighting system (immune system) attack harmful substances (such as germs) in your body. This immune system response causes inflammation. After the harmful substance is destroyed, the inflammation and the immune cells go away. When you have sarcoidosis, your immune system causes inflammation even when there are no harmful substances, and the inflammation does not go away. Sarcoidosis also causes cells from your immune system to form small clumps of tissue (granulomas) in the affected area of your body. What are the causes? The exact cause of sarcoidosis is not known.  It is possible that if you have a family history of this disease (genetic predisposition), the immune system response that leads to inflammation may be triggered by something in your environment, such as:  Bacteria or viruses.  Metals.  Chemicals.  Dust.  Mold or mildew. What increases the risk? You may be at a greater risk for sarcoidosis if you:  Have a family history of the disease.  Are African-American.  Are of Northern European descent.  Are 27-11 years old.  Work as a Airline pilot.  Work in an environment where you are exposed to metals, chemicals, mold or mildew, or insecticides. What are the signs or symptoms? Some people with sarcoidosis have no symptoms. Others have very mild symptoms. The symptoms usually depend on the organ that is affected. Sarcoidosis most often affects the lungs, which may include symptoms  such as:  Chest pain.  Coughing.  Wheezing.  Shortness of breath. Other common symptoms include:  Night sweats.  Fever.  Weight loss.  Fatigue.  Swollen lymph nodes.  Joint pain. How is this diagnosed? Sarcoidosis may be diagnosed based on:  Your symptoms and medical history.  A physical exam.  Imaging tests to check for granulomas such as: ? Chest X-ray. ? CT scan. ? MRI. ? PET scan.  Lung function tests. These tests evaluate your breathing and check for problems that may be related to sarcoidosis.  A procedure to remove a tissue sample for testing (biopsy). You may have a biopsy of lung tissue if that is where you are having symptoms. You may have tests to check for any complications of the condition. These tests may include:  Eye exams.  MRI of the heart or brain.  Echocardiogram.  Electrocardiogram (EKG or ECG). How is this treated? In some cases, sarcoidosis does not require a specific treatment because it causes no symptoms or mild symptoms. If your symptoms bother you or are severe, you may be prescribed medicines to reduce inflammation or relieve symptoms. These medicines may include:  Prednisone. This is a steroid that reduces inflammation related to sarcoidosis.  Hydroxychloroquine. This may be used to treat sarcoidosis that affects the skin, eyes, or brain.  Methotrexate, leflunomide, or azathioprine. These medicines affect the immune system and can help with sarcoidosis in the joints, eyes, skin, or lungs.  Medicines that you breathe in (inhalers). Inhalers can help you breathe if sarcoidosis affects your lungs. Follow these instructions at home:   Do not use  any products that contain nicotine or tobacco, such as cigarettes and e-cigarettes. If you need help quitting, ask your health care provider.  Avoid secondhand smoke and irritating dust or chemicals. Stay indoors on days when air quality is poor in your area.  Return to your normal  activities as told by your health care provider. Ask your health care provider what activities are safe for you.  Take or use over-the-counter and prescription medicines only as told by your health care provider.  Keep all follow-up visits as told by your health care provider. This is important. Contact a health care provider if:  You have vision problems.  You have a dry cough that does not go away.  You have an irregular heartbeat.  You have pain or aches in your joints, hands, or feet.  You have an unexplained rash. Get help right away if:  You have chest pain.  You have difficulty breathing. Summary  Sarcoidosis is a disease that can cause inflammation in many body areas of the body. It most often affects the lungs (pulmonary sarcoidosis). It can also affect the lymph nodes, liver, eyes, skin, heart, or any other body tissue.  When you have sarcoidosis, cells from your immune system form small clumps of tissue (granulomas) in the affected area of your body.  Sarcoidosis sometimes does not require a specific treatment because it causes no symptoms or mild symptoms.  If your symptoms bother you or are severe, you may be prescribed medicines to reduce inflammation or relieve symptoms. This information is not intended to replace advice given to you by your health care provider. Make sure you discuss any questions you have with your health care provider. Document Released: 02/16/2004 Document Revised: 03/30/2017 Document Reviewed: 01/23/2017 Elsevier Patient Education  2020 [employer].

## 2019-01-15 NOTE — Assessment & Plan Note (Addendum)
-   100% compliant with CPAP and reports benefit from use - Pressure 9cm h20; AHI 0.6 - No changes - Do not drive if experiencing excessive daytime fatigue or somnolence  - Needs ONO on CPAP

## 2019-01-15 NOTE — Progress Notes (Signed)
@Patient  ID: Madison Palmer, female    DOB: December 27, 1965, 53 y.o.   MRN: [id number]  Chief Complaint  Patient presents with  . Follow-up    Referring provider: [name], Charlene Brooke, NP  HPI: 53 year old female, never smoked. PMH significant for OSA on CPAP, chronic respiratory failure with hypoxia, pulmonary nodules, allergic rhinitis, GERD, chronic diastolic heart failure, HTN, LVH, PCOS, hyperparathyroidism, hypothyroidism, dysphagia. Patient of Dr. Elsworth Soho, last seen on 06/26/18. AHI 100/hr in 2008, CPAP at 9cm H20.   01/15/2019 Patient presents today for annual follow-up CPAP. She is doing well, no acute symptoms. 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 She is not experiencing any shortness of breath, chest tightness or cough. Compliant with CPAP. She is not experiencing any issues with mask fit or pressure setting. She is getting about 14 hours of sleep, states that she wakes up around 8am and then takes a nap and puts her CPAP back on. Not currently wearing oxygen at night.   [facility] download 8/17-9/15/20: - 30/30 days; 100% >4 hours - Average usage 14 hours 3 mins - Pressure 9cm H20 - AHI 0.6  Allergies  Allergen Reactions  . Fentanyl Hives  . Levofloxacin Hives  . Midazolam Hives  . Pollen Extract     seasonal  . Atorvastatin     Muscle pain in legs   . Doxycycline Itching and Swelling  . Hydralazine Hcl Other (See Comments)    Hypercalcemia   . Rosuvastatin     Abdominal pain    Immunization History  Administered Date(s) Administered  . Influenza,inj,Quad PF,6+ Mos 05/15/2017, 02/20/2018  . Influenza-Unspecified 02/14/2013, 02/23/2014, 04/20/2015, 05/15/2017, 02/20/2018  . Pneumococcal-Unspecified 02/10/2008, 02/09/2009  . Tdap 09/10/2017    Past Medical History:  Diagnosis Date  . Anemia   . Anxiety   . Arrhythmia    tachycardia  . Arthritis   . Chickenpox   . Depression   . Diverticulitis   . GERD (gastroesophageal reflux disease)   .  Glaucoma   . Hyperlipidemia   . Hyperparathyroidism ([place])   . Hypertension   . Inflammatory polyps of colon ([place])   . LVH (left ventricular hypertrophy)   . Lymphedema   . PCOS (polycystic ovarian syndrome)   . Prediabetes   . Recurrent UTI   . Sleep apnea    CPAP  . Thyroid disease   . Vitamin D deficiency     Tobacco History: Social History   Tobacco Use  Smoking Status Never Smoker  Smokeless Tobacco Never Used   Counseling given: Not Answered   Outpatient Medications Prior to Visit  Medication Sig Dispense Refill  . acetaminophen (TYLENOL) 500 MG tablet Take 1,000 mg by mouth 3 (three) times daily as needed for moderate pain or headache.    Marland Kitchen amLODipine (NORVASC) 10 MG tablet Take 1 tablet (10 mg total) by mouth daily. 90 tablet 1  . cetirizine (ZYRTEC ALLERGY) 10 MG tablet Take 1 tablet (10 mg total) by mouth daily. 30 tablet 5  . cloNIDine (CATAPRES) 0.2 MG tablet Take 1 tablet (0.2 mg total) by mouth 2 (two) times daily. Needs office visit with Dr. Percival Spanish for additional refills 180 tablet 0  . divalproex (DEPAKOTE) 250 MG DR tablet Take 1 tablet (250 mg total) by mouth 2 (two) times daily. 60 tablet 1  . esomeprazole (NEXIUM) 40 MG capsule TAKE 1 CAPSULE BY MOUTH TWICE DAILY 180 capsule 2  . fenofibrate (TRICOR) 145 MG tablet Take 1 tablet (145 mg total) by mouth  daily. 90 tablet 1  . fluticasone (FLONASE) 50 MCG/ACT nasal spray Place 2 sprays into both nostrils daily.    . furosemide (LASIX) 40 MG tablet Take 1 tablet (40 mg total) by mouth daily.    . hydrALAZINE (APRESOLINE) 50 MG tablet Take 2 tablets (100 mg total) by mouth 2 (two) times daily. 120 tablet 5  . ibuprofen (ADVIL) 200 MG tablet Take 400 mg by mouth every 6 (six) hours as needed for moderate pain.    Marland Kitchen levothyroxine (SYNTHROID) 75 MCG tablet Take 1 tablet (75 mcg total) by mouth daily before breakfast. Monday-Friday only 60 tablet 1  . lisinopril (ZESTRIL) 40 MG tablet Take 1 tablet (40 mg total) by  mouth daily. 90 tablet 1  . Lurasidone HCl 60 MG TABS Take 1 tablet (60 mg total) by mouth daily after supper. 30 tablet 1  . metFORMIN (GLUCOPHAGE) 500 MG tablet Take 1 tablet (500 mg total) by mouth 2 (two) times daily with a meal. 180 tablet 1  . perphenazine (TRILAFON) 4 MG tablet Take 1 tablet (4 mg total) by mouth daily. 30 tablet 1  . potassium chloride SA (K-DUR,KLOR-CON) 20 MEQ tablet Take 2 tablets (40 mEq total) by mouth daily. 180 tablet 2  . Vitamin D, Ergocalciferol, (DRISDOL) 1.25 MG (50000 UT) CAPS capsule TAKE 1 CAPSULE BY MOUTH EVERY 7 DAYS 12 capsule 0  . labetalol (NORMODYNE) 300 MG tablet Take 2 tablets (600 mg total) by mouth 2 (two) times a day. 360 tablet 1   No facility-administered medications prior to visit.    Review of Systems  Review of Systems  Constitutional: Negative.   HENT: Negative.   Respiratory: Negative for cough, shortness of breath and wheezing.   Cardiovascular: Negative.    Physical Exam  BP 114/70 (BP Location: Left Arm, Cuff Size: Large)   Pulse 72   Temp (!) 97 F (36.1 C) (Oral)   Ht 5\' 2"  (1.575 m)   Wt 296 lb 3.2 oz (134.4 kg)   LMP 05/02/2017   SpO2 98%   BMI 54.18 kg/m  Physical Exam Constitutional:      Appearance: Normal appearance. She is not ill-appearing.  HENT:     Head: Normocephalic and atraumatic.     Mouth/Throat:     Mouth: Mucous membranes are moist.     Pharynx: Oropharynx is clear.  Neck:     Musculoskeletal: Normal range of motion and neck supple.  Cardiovascular:     Rate and Rhythm: Normal rate and regular rhythm.  Pulmonary:     Effort: Pulmonary effort is normal.     Breath sounds: Normal breath sounds.  Musculoskeletal: Normal range of motion.  Neurological:     General: No focal deficit present.     Mental Status: She is alert and oriented to person, place, and time. Mental status is at baseline.  Psychiatric:        Mood and Affect: Mood normal.        Behavior: Behavior normal.        Thought  Content: Thought content normal.        Judgment: Judgment normal.     Comments: Flat affect      Lab Results:  CBC    Component Value Date/Time   WBC 5.4 09/25/2018 0313   RBC 4.27 09/25/2018 0313   HGB 12.7 09/25/2018 0313   HCT 40.4 09/25/2018 0313   PLT 229 09/25/2018 0313   MCV 94.6 09/25/2018 0313   MCH 29.7 09/25/2018  0313   MCHC 31.4 09/25/2018 0313   RDW 14.2 09/25/2018 0313   LYMPHSABS 1.1 09/25/2018 0313   MONOABS 0.6 09/25/2018 0313   EOSABS 0.1 09/25/2018 0313   BASOSABS 0.0 09/25/2018 0313    BMET    Component Value Date/Time   NA 144 12/30/2018 1022   K 4.8 12/30/2018 1022   CL 100 12/30/2018 1022   CO2 30 (H) 12/30/2018 1022   GLUCOSE 128 (H) 12/30/2018 1022   GLUCOSE 110 (H) 12/24/2018 1402   BUN 23 12/30/2018 1022   CREATININE 1.30 (H) 12/30/2018 1022   CREATININE 1.13 (H) 12/24/2018 1402   CALCIUM 10.5 (H) 12/30/2018 1022   GFRNONAA 47 (L) 12/30/2018 1022   GFRAA 55 (L) 12/30/2018 1022    BNP    Component Value Date/Time   BNP 147.2 (H) 09/15/2018 0918    ProBNP    Component Value Date/Time   PROBNP 173.0 (H) 02/20/2018 1536    Imaging: Mm 3d Screen Breast Bilateral  Result Date: 12/20/2018 CLINICAL DATA:  Screening. EXAM: DIGITAL SCREENING BILATERAL MAMMOGRAM WITH TOMO AND CAD COMPARISON:  Previous exam(s). ACR Breast Density Category a: The breast tissue is almost entirely fatty. FINDINGS: There are no findings suspicious for malignancy. Images were processed with CAD. IMPRESSION: No mammographic evidence of malignancy. A result letter of this screening mammogram will be mailed directly to the patient. RECOMMENDATION: Screening mammogram in one year. (Code:SM-B-01Y) BI-RADS CATEGORY  1: Negative. Electronically Signed   By: Ammie Ferrier M.D.   On: 12/20/2018 09:18     Assessment & Plan:   OSA on CPAP - 100% compliant with CPAP and reports benefit from use - Pressure 9cm h20; AHI 0.6 - No changes - Do not drive if  experiencing excessive daytime fatigue or somnolence  - Needs ONO on CPAP    Pulmonary nodules - Favored to be benign etiology, presumed sarcoid  - No cough, sob or chest pain - Completed prednisone course - Needs repeat CT chest wo contrast    Martyn Ehrich, NP 01/15/2019

## 2019-01-16 ENCOUNTER — Ambulatory Visit: Payer: BC Managed Care – PPO | Admitting: Cardiology

## 2019-01-16 ENCOUNTER — Encounter: Payer: Self-pay | Admitting: Cardiology

## 2019-01-16 VITALS — BP 126/74 | HR 83 | Temp 97.2°F | Ht 62.0 in | Wt 297.0 lb

## 2019-01-16 DIAGNOSIS — G4730 Sleep apnea, unspecified: Secondary | ICD-10-CM

## 2019-01-16 DIAGNOSIS — I5032 Chronic diastolic (congestive) heart failure: Secondary | ICD-10-CM

## 2019-01-16 DIAGNOSIS — I517 Cardiomegaly: Secondary | ICD-10-CM

## 2019-01-16 DIAGNOSIS — I10 Essential (primary) hypertension: Secondary | ICD-10-CM

## 2019-01-16 DIAGNOSIS — M7989 Other specified soft tissue disorders: Secondary | ICD-10-CM | POA: Diagnosis not present

## 2019-01-16 DIAGNOSIS — R931 Abnormal findings on diagnostic imaging of heart and coronary circulation: Secondary | ICD-10-CM

## 2019-01-16 NOTE — Patient Instructions (Addendum)
Medication Instructions:  Your physician recommends that you continue on your current medications as directed. Please refer to the Current Medication list given to you today.  If you need a refill on your cardiac medications before your next appointment, please call your pharmacy.   Lab work: NONE  Testing/Procedures: NONE  Follow-Up: At [facility], you and your health needs are our priority.  As part of our continuing mission to provide you with exceptional heart care, we have created designated Provider Care Teams.  These Care Teams include your primary Cardiologist (physician) and Advanced Practice Providers (APPs -  Physician Assistants and Nurse Practitioners) who all work together to provide you with the care you need, when you need it. You will need a follow up appointment in 4 months.  Please call our office 2 months in advance to schedule this appointment.  You may see Jory Sims, DNP, ANP or one of the following Advanced Practice Providers on your designated Care Team:   Rosaria Ferries, PA-C

## 2019-01-17 ENCOUNTER — Encounter: Payer: Self-pay | Admitting: Cardiology

## 2019-01-22 ENCOUNTER — Other Ambulatory Visit: Payer: Self-pay

## 2019-01-22 ENCOUNTER — Ambulatory Visit (INDEPENDENT_AMBULATORY_CARE_PROVIDER_SITE_OTHER): Payer: BC Managed Care – PPO | Admitting: Psychiatry

## 2019-01-22 ENCOUNTER — Encounter (HOSPITAL_COMMUNITY): Payer: Self-pay | Admitting: Psychiatry

## 2019-01-22 DIAGNOSIS — F419 Anxiety disorder, unspecified: Secondary | ICD-10-CM | POA: Diagnosis not present

## 2019-01-22 DIAGNOSIS — F319 Bipolar disorder, unspecified: Secondary | ICD-10-CM

## 2019-01-22 MED ORDER — DIVALPROEX SODIUM 250 MG PO DR TAB: 250.0000 mg | DELAYED_RELEASE_TABLET | Freq: Two times a day (BID) | ORAL | 1 refills | Status: DC

## 2019-01-22 MED ORDER — PERPHENAZINE 2 MG PO TABS: 2.0000 mg | ORAL_TABLET | Freq: Every day | ORAL | 1 refills | Status: DC

## 2019-01-22 MED ORDER — LURASIDONE HCL 60 MG PO TABS: 60.0000 mg | ORAL_TABLET | Freq: Every day | ORAL | 1 refills | Status: DC

## 2019-01-22 NOTE — Progress Notes (Signed)
Virtual Visit via Telephone Note  I connected with Madison Palmer on 01/22/19 at 11:40 AM EDT by telephone and verified that I am speaking with the correct person using two identifiers.   I discussed the limitations, risks, security and privacy concerns of performing an evaluation and management service by telephone and the availability of in person appointments. I also discussed with the patient that there may be a patient responsible charge related to this service. The patient expressed understanding and agreed to proceed.   History of Present Illness: Patient was evaluated by phone session.  She is with her friend Madison Palmer who also provide information.  On her last visit we had cut down her Trilafon and she is taking 4 mg along with Latuda and Depakote.  She reported her mood is okay but sometimes she gets very tired and sleepy.  She is sleeping 10 to 11 hours at night.  However she denies any hallucination, paranoia, impulsive behavior or any irritability.  We also had cut down her Depakote and now she is only taking 250 mg at bedtime.  She is now thinking to move back to [place] and of October to live close to her family.  She misses her 38 year old son who is living with his father in [place].  She denies any crying spells or any feeling of hopelessness or worthlessness.  She is also taking multiple medication for her general health.  Recently she had a blood work and her total cholesterol was 255, LDL 186 and triglyceride 186.  Her creatinine was 1.3.  Patient reported no tremors, shakes or any EPS.  Her energy level is fair.  She lives by herself however her friend Madison Palmer helps to organize her medication.  Patient also wanted to have her driving privileges back which was revoked by previous psychiatrist in April due to psychiatric issues.  Patient had a break-up in April and then she was admitted back to back in May for psychosis.  She is not seeing any therapist and she feels that she does not need  at this time.  She uses CPAP machine.  She lives by herself.  She denies drinking or using any illegal substances.   Past psychiatric history; H/O depression since early age and seen on and off therapist while living in [place]. Do not recall seeing psychiatrist until admitted at [facility] in May 20202 for delusion, psychosis and violent behavior. D/C on Depakote 500 mg BID, Latuda 60 mg daily and Trilafon 12 mg a day. No h/o suicidal attempt, nightmares, panic attack or OCD symptoms. No h/o drinking and drug use.   Recent Results (from the past 2160 hour(s))  Basic metabolic panel     Status: Abnormal   Collection Time: 12/24/18  2:02 PM  Result Value Ref Range   Glucose, Bld 110 (H) 65 - 99 mg/dL    Comment: .            Fasting reference interval . For someone without known diabetes, a glucose value between 100 and 125 mg/dL is consistent with prediabetes and should be confirmed with a follow-up test. .    BUN 19 7 - 25 mg/dL   Creat 1.13 (H) 0.50 - 1.05 mg/dL    Comment: For patients >98 years of age, the reference limit for Creatinine is approximately 13% higher for people identified as African-American. .    BUN/Creatinine Ratio 17 6 - 22 (calc)   Sodium 141 135 - 146 mmol/L   Potassium  4.3 3.5 - 5.3 mmol/L   Chloride 103 98 - 110 mmol/L   CO2 30 20 - 32 mmol/L   Calcium 10.2 8.6 - 10.4 mg/dL  Hemoglobin A1c     Status: Abnormal   Collection Time: 12/24/18  2:02 PM  Result Value Ref Range   Hgb A1c MFr Bld 5.7 (H) <5.7 % of total Hgb    Comment: For someone without known diabetes, a hemoglobin  A1c value between 5.7% and 6.4% is consistent with prediabetes and should be confirmed with a  follow-up test. . For someone with known diabetes, a value <7% indicates that their diabetes is well controlled. A1c targets should be individualized based on duration of diabetes, age, comorbid conditions, and other considerations. . This assay result is  consistent with an increased risk of diabetes. . Currently, no consensus exists regarding use of hemoglobin A1c for diagnosis of diabetes for children. .    Mean Plasma Glucose 117 (calc)   eAG (mmol/L) 6.5 (calc)  TSH     Status: None   Collection Time: 12/24/18  2:02 PM  Result Value Ref Range   TSH 1.89 mIU/L    Comment:           Reference Range .           > or = 20 Years  0.40-4.50 .                Pregnancy Ranges           First trimester    0.26-2.66           Second trimester   0.55-2.73           Third trimester    0.43-2.91   T4, free     Status: None   Collection Time: 12/24/18  2:02 PM  Result Value Ref Range   Free T4 1.3 0.8 - 1.8 ng/dL  Hepatic function panel     Status: None   Collection Time: 12/24/18  2:02 PM  Result Value Ref Range   Total Protein 6.8 6.1 - 8.1 g/dL   Albumin 4.0 3.6 - 5.1 g/dL   Globulin 2.8 1.9 - 3.7 g/dL (calc)   AG Ratio 1.4 1.0 - 2.5 (calc)   Total Bilirubin 0.5 0.2 - 1.2 mg/dL   Bilirubin, Direct 0.1 0.0 - 0.2 mg/dL   Indirect Bilirubin 0.4 0.2 - 1.2 mg/dL (calc)   Alkaline phosphatase (APISO) 95 37 - 153 U/L   AST 15 10 - 35 U/L   ALT 18 6 - 29 U/L  Lipid panel     Status: Abnormal   Collection Time: 12/24/18  2:02 PM  Result Value Ref Range   Cholesterol 262 (H) <200 mg/dL   HDL 47 (L) > OR = 50 mg/dL   Triglycerides 185 (H) <150 mg/dL   LDL Cholesterol (Calc) 181 (H) mg/dL (calc)    Comment: Reference range: <100 . Desirable range <100 mg/dL for primary prevention;   <70 mg/dL for patients with CHD or diabetic patients  with > or = 2 CHD risk factors. Marland Kitchen LDL-C is now calculated using the Martin-Hopkins  calculation, which is a validated novel method providing  better accuracy than the Friedewald equation in the  estimation of LDL-C.  Cresenciano Genre et al. Annamaria Helling. [id number]): 2061-2068  (http://education.[facility].com/faq/FAQ164)    Total CHOL/HDL Ratio 5.6 (H) <5.0 (calc)   Non-HDL Cholesterol (Calc) 215 (H)  <130 mg/dL (calc)    Comment: For patients with diabetes plus  1 major ASCVD risk  factor, treating to a non-HDL-C goal of <100 mg/dL  (LDL-C of <70 mg/dL) is considered a therapeutic  option.   Comprehensive metabolic panel     Status: Abnormal   Collection Time: 12/30/18 10:22 AM  Result Value Ref Range   Glucose 128 (H) 65 - 99 mg/dL   BUN 23 6 - 24 mg/dL   Creatinine, Ser 1.30 (H) 0.57 - 1.00 mg/dL   GFR calc non Af Amer 47 (L) >59 mL/min/1.73   GFR calc Af Amer 55 (L) >59 mL/min/1.73   BUN/Creatinine Ratio 18 9 - 23   Sodium 144 134 - 144 mmol/L   Potassium 4.8 3.5 - 5.2 mmol/L   Chloride 100 96 - 106 mmol/L   CO2 30 (H) 20 - 29 mmol/L   Calcium 10.5 (H) 8.7 - 10.2 mg/dL   Total Protein 6.6 6.0 - 8.5 g/dL   Albumin 4.1 3.8 - 4.9 g/dL   Globulin, Total 2.5 1.5 - 4.5 g/dL   Albumin/Globulin Ratio 1.6 1.2 - 2.2   Bilirubin Total 0.7 0.0 - 1.2 mg/dL   Alkaline Phosphatase 102 39 - 117 IU/L   AST 13 0 - 40 IU/L   ALT 16 0 - 32 IU/L  Insulin, random     Status: None   Collection Time: 12/30/18 10:22 AM  Result Value Ref Range   INSULIN 21.1 2.6 - 24.9 uIU/mL  Lipid Panel With LDL/HDL Ratio     Status: Abnormal   Collection Time: 12/30/18 10:22 AM  Result Value Ref Range   Cholesterol, Total 255 (H) 100 - 199 mg/dL   Triglycerides 106 0 - 149 mg/dL   HDL 50 >39 mg/dL   VLDL Cholesterol Cal 19 5 - 40 mg/dL   LDL Chol Calc (NIH) 186 (H) 0 - 99 mg/dL   Lipid Comment: CANCELED     Comment: Test not performed  Result canceled by the ancillary.    LDL/HDL Ratio 3.7 (H) 0.0 - 3.2 ratio    Comment:                                     LDL/HDL Ratio                                             Men  Women                               1/2 Avg.Risk  1.0    1.5                                   Avg.Risk  3.6    3.2                                2X Avg.Risk  6.2    5.0                                3X Avg.Risk  8.0    6.1   Vitamin B12     Status: None  Collection Time:  12/30/18 10:22 AM  Result Value Ref Range   Vitamin B-12 489 232 - 1,245 pg/mL  VITAMIN D 25 Hydroxy (Vit-D Deficiency, Fractures)     Status: None   Collection Time: 12/30/18 10:22 AM  Result Value Ref Range   Vit D, 25-Hydroxy 36.5 30.0 - 100.0 ng/mL    Comment: Vitamin D deficiency has been defined by the [place] practice guideline as a level of serum 25-OH vitamin D less than 20 ng/mL (1,2). The Endocrine Society went on to further define vitamin D insufficiency as a level between 21 and 29 ng/mL (2). 1. IOM (Institute of Medicine). 2010. Dietary reference    intakes for calcium and D. [place]: The    [employer]. 2. Holick MF, [place], Bischoff-Ferrari HA, et al.    Evaluation, treatment, and prevention of vitamin D    deficiency: an Endocrine Society clinical practice    guideline. JCEM. 2011 Jul; 96(7):1911-30.       Psychiatric Specialty Exam: Physical Exam  ROS  Last menstrual period 05/02/2017.There is no height or weight on file to calculate BMI.  General Appearance: NA  Eye Contact:  NA  Speech:  Slow  Volume:  Decreased  Mood:  Tired  Affect:  NA  Thought Process:  Descriptions of Associations: Intact  Orientation:  Full (Time, Place, and Person)  Thought Content:  Rumination  Suicidal Thoughts:  No  Homicidal Thoughts:  No  Memory:  Immediate;   Fair Recent;   Fair Remote;   Fair  Judgement:  Fair  Insight:  Good  Psychomotor Activity:  NA  Concentration:  Concentration: Fair and Attention Span: Fair  Recall:  [employer] of Knowledge:  Fair  Language:  Fair  Akathisia:  No  Handed:  Right  AIMS (if indicated):     Assets:  Communication Skills Desire for Improvement Housing Resilience Social Support  ADL's:  Intact  Cognition:  WNL  Sleep:   Sleeping too much.  10 to 11 hours at night      Assessment and Plan: Bipolar disorder type I.  Anxiety.  Discussed her current  medication, current labs and records from other provider.  She is still having too much sleep and feeling tired.  However she is not having any hallucination or severe mood swings.  I recommend to decrease Trilafon to only 2 mg daily and continue Latuda 60 mg daily and Depakote 250 mg twice a day.  Patient is taking 2 antipsychotic medication and plan is to eventually keep her on minimal medication.  She feel that her energy level is much improved since we cut down the Depakote and Trilafon.  She has a plan to go back to [place] at the end of October.  However she like to keep appointment since we are doing virtual visits.  I recommend that she should wait for driving through this because we will need in person examination to consider resuming driving.  Patient is still have attention and concentration issues.  She tends to forget things and her friend Madison Palmer who is RN organizing her medication.  She agree with the plan.  Encourage healthy lifestyle and watch her calorie intake.  Recommend to do regular exercise and encourage hydration since her creatinine is 1.3.  We will consider taking her off from [place] on her next appointment and reducing the Depakote.  Recommended to call us back if she has any question or any concern.  Discussed safety concern that anytime having active suicidal thoughts or homicidal thought that she need to call 911 or go to local emergency room.  Follow-up in 2 months.  Time spent 25 minutes.  More than 50% of the time spent in psychoeducation, counseling and coronation of care.  Follow Up Instructions:    I discussed the assessment and treatment plan with the patient. The patient was provided an opportunity to ask questions and all were answered. The patient agreed with the plan and demonstrated an understanding of the instructions.   The patient was advised to call back or seek an in-person evaluation if the symptoms worsen or if the condition fails to improve as  anticipated.  I provided 25 minutes of non-face-to-face time during this encounter.   Kathlee Nations, MD

## 2019-01-24 ENCOUNTER — Telehealth: Payer: Self-pay | Admitting: Primary Care

## 2019-01-24 DIAGNOSIS — G4734 Idiopathic sleep related nonobstructive alveolar hypoventilation: Secondary | ICD-10-CM

## 2019-01-24 DIAGNOSIS — G4733 Obstructive sleep apnea (adult) (pediatric): Secondary | ICD-10-CM

## 2019-01-24 DIAGNOSIS — Z9989 Dependence on other enabling machines and devices: Secondary | ICD-10-CM

## 2019-01-24 NOTE — Telephone Encounter (Signed)
Call made to patient, confirmed DOB. Made aware of results of ONO. She already has oxygen at night. I made her aware to start wearing 1L at night time. Voiced understanding. Will place order to DME making them aware of new orders. Nothing further needed at this time.

## 2019-01-24 NOTE — Telephone Encounter (Signed)
ONO showed SpO2 low 85%, baseline 91%. She spent 8 mins <88%. Recommend she use 1L oxygen at night.

## 2019-01-27 ENCOUNTER — Ambulatory Visit (HOSPITAL_COMMUNITY): Payer: BC Managed Care – PPO | Admitting: Psychiatry

## 2019-01-27 ENCOUNTER — Ambulatory Visit (INDEPENDENT_AMBULATORY_CARE_PROVIDER_SITE_OTHER): Payer: BC Managed Care – PPO | Admitting: Family Medicine

## 2019-01-27 ENCOUNTER — Other Ambulatory Visit: Payer: Self-pay

## 2019-01-27 VITALS — BP 128/80 | HR 77 | Temp 98.1°F | Ht 62.0 in | Wt 293.0 lb

## 2019-01-27 DIAGNOSIS — E559 Vitamin D deficiency, unspecified: Secondary | ICD-10-CM | POA: Diagnosis not present

## 2019-01-27 DIAGNOSIS — Z6841 Body Mass Index (BMI) 40.0 and over, adult: Secondary | ICD-10-CM

## 2019-01-27 DIAGNOSIS — R7303 Prediabetes: Secondary | ICD-10-CM

## 2019-01-28 NOTE — Progress Notes (Signed)
Office: [phone]  /  Fax: [phone]   HPI:   Chief Complaint: OBESITY Madison Palmer is here to discuss her progress with her obesity treatment plan. She is on the Category 2 plan with Category 3 plan for breakfast 3 days a week and is following her eating plan approximately 80 % of the time. She states she is doing water exercise and walking for 30 minutes 1 times per week. Madison Palmer voices the last few weeks she has been skipping lunch, mostly skipping fruit. She is not really doing snacks, but doing [place] bar occasionally and then counting snack calories as preparation of food. She is back on oxygen 16 at night. She is moving back to [place] at the end of October.  Her weight is 293 lb (132.9 kg) today and has had a weight loss of 1 pound over a period of 2 weeks since her last visit. She has lost 64 lbs since starting treatment with [place].  Vitamin D Deficiency Madison Palmer has a diagnosis of vitamin D deficiency. She stopped Vit D and calcium per Nephrology recently. She notes fatigue and denies nausea, vomiting or muscle weakness.  Pre-Diabetes Madison Palmer has a diagnosis of pre-diabetes based on her elevated Hgb A1c and was informed this puts her at greater risk of developing diabetes. Last Hgb A1c was of 5.7, and has improved over the last year. She denies GI side effects of metformin and continues to work on diet and exercise to decrease risk of diabetes. She denies hypoglycemia.  ASSESSMENT AND PLAN:  Vitamin D deficiency  Prediabetes  Class 3 severe obesity with serious comorbidity and body mass index (BMI) of 50.0 to 59.9 in adult, unspecified obesity type ([place])  PLAN:  Vitamin D Deficiency Madison Palmer was informed that low vitamin D levels contributes to fatigue and are associated with obesity, breast, and colon cancer. She will follow up for routine testing of vitamin D, at least 2-3 times per year. She was informed of the risk of over-replacement of vitamin D and agrees to not increase her  dose unless she discusses this with [place] first. We will repeat labs prior to move.   Pre-Diabetes Madison Palmer will continue to work on weight loss, exercise, and decreasing simple carbohydrates in her diet to help decrease the risk of diabetes. We dicussed metformin including benefits and risks. She was informed that eating too many simple carbohydrates or too many calories at one sitting increases the likelihood of GI side effects. Madison Palmer agrees to continue taking metformin, no refill needed. Madison Palmer agrees to follow up with our clinic in 2 weeks as directed to monitor her progress.  I spent > than 50% of the 15 minute visit on counseling as documented in the note.  Obesity Madison Palmer is currently in the action stage of change. As such, her goal is to continue with weight loss efforts She has agreed to follow the Category 2 plan Madison Palmer has been instructed to work up to a goal of 150 minutes of combined cardio and strengthening exercise per week for weight loss and overall health benefits. We discussed the following Behavioral Modification Strategies today: increasing lean protein intake, increasing vegetables and work on meal planning and easy cooking plans, keeping healthy foods in the home, and planning for success   Madison Palmer has agreed to follow up with our clinic in 2 weeks. She was informed of the importance of frequent follow up visits to maximize her success with intensive lifestyle modifications for her multiple health conditions.  ALLERGIES: Allergies  Allergen  Reactions   Fentanyl Hives   Levofloxacin Hives   Midazolam Hives   Pollen Extract     seasonal   Atorvastatin     Muscle pain in legs    Doxycycline Itching and Swelling   Hydralazine Hcl Other (See Comments)    Hypercalcemia    Rosuvastatin     Abdominal pain    MEDICATIONS: Current Outpatient Medications on File Prior to Visit  Medication Sig Dispense Refill   acetaminophen (TYLENOL) 500 MG tablet Take 1,000 mg by  mouth 3 (three) times daily as needed for moderate pain or headache.     amLODipine (NORVASC) 10 MG tablet Take 1 tablet (10 mg total) by mouth daily. 90 tablet 1   cetirizine (ZYRTEC ALLERGY) 10 MG tablet Take 1 tablet (10 mg total) by mouth daily. 30 tablet 5   cloNIDine (CATAPRES) 0.2 MG tablet Take 1 tablet (0.2 mg total) by mouth 2 (two) times daily. Needs office visit with Dr. Percival Spanish for additional refills 180 tablet 0   divalproex (DEPAKOTE) 250 MG DR tablet Take 1 tablet (250 mg total) by mouth 2 (two) times daily. 60 tablet 1   esomeprazole (NEXIUM) 40 MG capsule TAKE 1 CAPSULE BY MOUTH TWICE DAILY 180 capsule 2   fenofibrate (TRICOR) 145 MG tablet Take 1 tablet (145 mg total) by mouth daily. 90 tablet 1   fluticasone (FLONASE) 50 MCG/ACT nasal spray Place 2 sprays into both nostrils daily.     furosemide (LASIX) 40 MG tablet Take 1 tablet (40 mg total) by mouth daily.     hydrALAZINE (APRESOLINE) 50 MG tablet Take 2 tablets (100 mg total) by mouth 2 (two) times daily. 120 tablet 5   ibuprofen (ADVIL) 200 MG tablet Take 400 mg by mouth every 6 (six) hours as needed for moderate pain.     levothyroxine (SYNTHROID) 75 MCG tablet Take 1 tablet (75 mcg total) by mouth daily before breakfast. Monday-Friday only 60 tablet 1   lisinopril (ZESTRIL) 40 MG tablet Take 1 tablet (40 mg total) by mouth daily. 90 tablet 1   Lurasidone HCl 60 MG TABS Take 1 tablet (60 mg total) by mouth daily after supper. 30 tablet 1   metFORMIN (GLUCOPHAGE) 500 MG tablet Take 1 tablet (500 mg total) by mouth 2 (two) times daily with a meal. 180 tablet 1   perphenazine (TRILAFON) 2 MG tablet Take 1 tablet (2 mg total) by mouth daily. 30 tablet 1   potassium chloride SA (K-DUR,KLOR-CON) 20 MEQ tablet Take 2 tablets (40 mEq total) by mouth daily. 180 tablet 2   Vitamin D, Ergocalciferol, (DRISDOL) 1.25 MG (50000 UT) CAPS capsule TAKE 1 CAPSULE BY MOUTH EVERY 7 DAYS 12 capsule 0   labetalol (NORMODYNE)  300 MG tablet Take 2 tablets (600 mg total) by mouth 2 (two) times a day. 360 tablet 1   No current facility-administered medications on file prior to visit.     PAST MEDICAL HISTORY: Past Medical History:  Diagnosis Date   Anemia    Anxiety    Arrhythmia    tachycardia   Arthritis    Chickenpox    Depression    Diverticulitis    GERD (gastroesophageal reflux disease)    Glaucoma    Hyperlipidemia    Hyperparathyroidism ([place])    Hypertension    Inflammatory polyps of colon (HCC)    LVH (left ventricular hypertrophy)    Lymphedema    PCOS (polycystic ovarian syndrome)    Prediabetes    Recurrent  UTI    Sleep apnea    CPAP   Thyroid disease    Vitamin D deficiency     PAST SURGICAL HISTORY: Past Surgical History:  Procedure Laterality Date   BREAST BIOPSY  2015   CESAREAN SECTION  2004   INNER EAR SURGERY     ear and sinus surgery   LAPAROSCOPIC REPAIR AND REMOVAL OF GASTRIC BAND     OOPHORECTOMY Left    TONSILLECTOMY AND ADENOIDECTOMY      SOCIAL HISTORY: Social History   Tobacco Use   Smoking status: Never Smoker   Smokeless tobacco: Never Used  Substance Use Topics   Alcohol use: Yes    Comment: social   Drug use: No    FAMILY HISTORY: Family History  Adopted: Yes  Problem Relation Age of Onset   Hearing loss Son        right    Healthy Son     ROS: Review of Systems  Constitutional: Positive for malaise/fatigue and weight loss.  Gastrointestinal: Negative for nausea and vomiting.  Musculoskeletal:       Negative muscle weakness  Endo/Heme/Allergies:       Negative hypoglycemia    PHYSICAL EXAM: Blood pressure 128/80, pulse 77, temperature 98.1 F (36.7 C), temperature source Oral, height 5\' 2"  (1.575 m), weight 293 lb (132.9 kg), last menstrual period 05/02/2017, SpO2 96 %. Body mass index is 53.59 kg/m. Physical Exam Vitals signs reviewed.  Constitutional:      Appearance: Normal appearance. She is  obese.  Cardiovascular:     Rate and Rhythm: Normal rate.     Pulses: Normal pulses.  Pulmonary:     Effort: Pulmonary effort is normal.     Breath sounds: Normal breath sounds.  Musculoskeletal: Normal range of motion.  Skin:    General: Skin is warm and dry.  Neurological:     Mental Status: She is alert and oriented to person, place, and time.  Psychiatric:        Mood and Affect: Mood normal.        Behavior: Behavior normal.     RECENT LABS AND TESTS: BMET    Component Value Date/Time   NA 144 12/30/2018 1022   K 4.8 12/30/2018 1022   CL 100 12/30/2018 1022   CO2 30 (H) 12/30/2018 1022   GLUCOSE 128 (H) 12/30/2018 1022   GLUCOSE 110 (H) 12/24/2018 1402   BUN 23 12/30/2018 1022   CREATININE 1.30 (H) 12/30/2018 1022   CREATININE 1.13 (H) 12/24/2018 1402   CALCIUM 10.5 (H) 12/30/2018 1022   GFRNONAA 47 (L) 12/30/2018 1022   GFRAA 55 (L) 12/30/2018 1022   Lab Results  Component Value Date   HGBA1C 5.7 (H) 12/24/2018   HGBA1C 6.0 (H) 02/27/2018   HGBA1C 5.7 09/10/2017   HGBA1C 5.9 05/29/2017   Lab Results  Component Value Date   INSULIN 21.1 12/30/2018   INSULIN 9.6 02/27/2018   CBC    Component Value Date/Time   WBC 5.4 09/25/2018 0313   RBC 4.27 09/25/2018 0313   HGB 12.7 09/25/2018 0313   HCT 40.4 09/25/2018 0313   PLT 229 09/25/2018 0313   MCV 94.6 09/25/2018 0313   MCH 29.7 09/25/2018 0313   MCHC 31.4 09/25/2018 0313   RDW 14.2 09/25/2018 0313   LYMPHSABS 1.1 09/25/2018 0313   MONOABS 0.6 09/25/2018 0313   EOSABS 0.1 09/25/2018 0313   BASOSABS 0.0 09/25/2018 0313   Iron/TIBC/Ferritin/ %Sat    Component Value Date/Time  IRON 44 02/20/2018 1536   IRON 81 05/29/2017   TIBC 347 05/29/2017   IRONPCTSAT 8.8 (L) 02/20/2018 1536   Lipid Panel     Component Value Date/Time   CHOL 255 (H) 12/30/2018 1022   TRIG 106 12/30/2018 1022   HDL 50 12/30/2018 1022   CHOLHDL 5.6 (H) 12/24/2018 1402   VLDL 26.6 09/10/2017 1156   LDLCALC 186 (H)  12/30/2018 1022   LDLCALC 181 (H) 12/24/2018 1402   Hepatic Function Panel     Component Value Date/Time   PROT 6.6 12/30/2018 1022   ALBUMIN 4.1 12/30/2018 1022   AST 13 12/30/2018 1022   ALT 16 12/30/2018 1022   ALKPHOS 102 12/30/2018 1022   BILITOT 0.7 12/30/2018 1022   BILIDIR 0.1 12/24/2018 1402   IBILI 0.4 12/24/2018 1402      Component Value Date/Time   TSH 1.89 12/24/2018 1402   TSH 0.409 09/16/2018 0301   TSH 0.938 05/11/2018 0630   TSH 1.450 02/27/2018 1233   TSH 0.97 12/11/2017 1158      OBESITY BEHAVIORAL INTERVENTION VISIT  Today's visit was # 17   Starting weight: 357 lbs Starting date: 02/27/18 Today's weight : 293 lbs  Today's date: 01/27/2019 Total lbs lost to date: 36    ASK: We discussed the diagnosis of obesity with [name] today and Dimple agreed to give [place] permission to discuss obesity behavioral modification therapy today.  ASSESS: Cathy has the diagnosis of obesity and her BMI today is 53.58 [place] is in the action stage of change   ADVISE: Annah was educated on the multiple health risks of obesity as well as the benefit of weight loss to improve her health. She was advised of the need for long term treatment and the importance of lifestyle modifications to improve her current health and to decrease her risk of future health problems.  AGREE: Multiple dietary modification options and treatment options were discussed and  Darice agreed to follow the recommendations documented in the above note.  ARRANGE: Dovie was educated on the importance of frequent visits to treat obesity as outlined per CMS and USPSTF guidelines and agreed to schedule her next follow up appointment today.  I, Trixie Dredge, am acting as transcriptionist for Ilene Qua, MD  I have reviewed the above documentation for accuracy and completeness, and I agree with the above. - Ilene Qua, MD

## 2019-02-09 ENCOUNTER — Other Ambulatory Visit: Payer: Self-pay | Admitting: Nurse Practitioner

## 2019-02-09 DIAGNOSIS — I10 Essential (primary) hypertension: Secondary | ICD-10-CM

## 2019-02-09 DIAGNOSIS — I160 Hypertensive urgency: Secondary | ICD-10-CM

## 2019-02-10 ENCOUNTER — Other Ambulatory Visit: Payer: Self-pay

## 2019-02-10 ENCOUNTER — Ambulatory Visit (INDEPENDENT_AMBULATORY_CARE_PROVIDER_SITE_OTHER)
Admission: RE | Admit: 2019-02-10 | Discharge: 2019-02-10 | Disposition: A | Discharge status: Home / Self Care | Payer: BC Managed Care – PPO | Source: Ambulatory Visit | Attending: Primary Care | Admitting: Primary Care

## 2019-02-10 DIAGNOSIS — R911 Solitary pulmonary nodule: Secondary | ICD-10-CM

## 2019-02-11 ENCOUNTER — Telehealth (HOSPITAL_COMMUNITY): Payer: Self-pay

## 2019-02-11 ENCOUNTER — Ambulatory Visit (INDEPENDENT_AMBULATORY_CARE_PROVIDER_SITE_OTHER): Payer: BC Managed Care – PPO | Admitting: Family Medicine

## 2019-02-11 DIAGNOSIS — I10 Essential (primary) hypertension: Secondary | ICD-10-CM

## 2019-02-11 DIAGNOSIS — I160 Hypertensive urgency: Secondary | ICD-10-CM

## 2019-02-11 MED ORDER — CLONIDINE HCL 0.2 MG PO TABS: 0.2000 mg | ORAL_TABLET | Freq: Two times a day (BID) | ORAL | 0 refills | Status: DC

## 2019-02-11 NOTE — Telephone Encounter (Signed)
She should consult to her new provider about her driving privileges.

## 2019-02-11 NOTE — Telephone Encounter (Signed)
Patient states that she is moving to [place] at the end of the month, she wants to know if you are going to reinstate her licence? Please review and advise, thank you

## 2019-02-12 NOTE — Telephone Encounter (Signed)
I called patient back, she said that she has no other psychiatrist, you are it. She said she feels she is stable now and would like her driving reinstated.

## 2019-02-13 ENCOUNTER — Encounter (INDEPENDENT_AMBULATORY_CARE_PROVIDER_SITE_OTHER): Payer: Self-pay | Admitting: Family Medicine

## 2019-02-13 ENCOUNTER — Other Ambulatory Visit: Payer: Self-pay

## 2019-02-13 ENCOUNTER — Ambulatory Visit (INDEPENDENT_AMBULATORY_CARE_PROVIDER_SITE_OTHER): Payer: BC Managed Care – PPO | Admitting: Family Medicine

## 2019-02-13 VITALS — BP 129/80 | HR 82 | Temp 98.1°F | Ht 62.0 in | Wt 292.0 lb

## 2019-02-13 DIAGNOSIS — E559 Vitamin D deficiency, unspecified: Secondary | ICD-10-CM | POA: Diagnosis not present

## 2019-02-13 DIAGNOSIS — Z6841 Body Mass Index (BMI) 40.0 and over, adult: Secondary | ICD-10-CM

## 2019-02-13 DIAGNOSIS — R7303 Prediabetes: Secondary | ICD-10-CM | POA: Diagnosis not present

## 2019-02-13 NOTE — Telephone Encounter (Signed)
I don't think she is stable enough to drive. Her has memory issues. Her friend [name] help organized her medications. She should wait until she is more stable.

## 2019-02-14 NOTE — Telephone Encounter (Signed)
Called patient back and got the voicemail, I left a message asking her to return my call.

## 2019-02-18 NOTE — Progress Notes (Signed)
Office: [phone]  /  Fax: [phone]   HPI:   Chief Complaint: OBESITY Madison Palmer is here to discuss her progress with her obesity treatment plan. She is on the Category 2 plan and is following her eating plan approximately 80 % of the time. She states she is walking 300 feet and ins the pool for 30 minutes 2 times per week. Madison Palmer is doing well. She notes on her scale she is below 280 lbs. She denies hunger. She is alternating yogurt versus egg breakfast. She is occasionally not eating all of her lunch but trying to get at least protein in at lunch. If she is not hungry she will eat 4 oz of meat for a microwave meal. She is moving October 30th to [place].  Her weight is 292 lb (132.5 kg) today and has had a weight loss of 1 pound over a period of 2 weeks since her last visit. She has lost 65 lbs since starting treatment with [place].  Pre-Diabetes Madison Palmer has a diagnosis of pre-diabetes based on her elevated Hgb A1c and was informed this puts her at greater risk of developing diabetes. Last Hgb A1c was of 5.7 and insulin of 21.1. She is taking metformin 1 time daily and continues to work on diet and exercise to decrease risk of diabetes. She denies nausea or hypoglycemia.  Vitamin D Deficiency Madison Palmer has a diagnosis of vitamin D deficiency. She denies nausea, vomiting or muscle weakness. She was told by her kidney doctor to stop Vit D replacement.  ASSESSMENT AND PLAN:  Prediabetes  Vitamin D deficiency  Class 3 severe obesity with serious comorbidity and body mass index (BMI) of 50.0 to 59.9 in adult, unspecified obesity type [facility])  PLAN:  Pre-Diabetes Madison Palmer will continue to work on weight loss, exercise, and decreasing simple carbohydrates in her diet to help decrease the risk of diabetes. We dicussed metformin including benefits and risks. She was informed that eating too many simple carbohydrates or too many calories at one sitting increases the likelihood of GI side effects. Madison Palmer  agrees to continue taking metformin, no refill needed. Madison Palmer agrees to follow up with our clinic in 2 weeks as directed to monitor her progress.  Vitamin D Deficiency Madison Palmer was informed that low vitamin D levels contributes to fatigue and are associated with obesity, breast, and colon cancer. Madison Palmer was encouraged to take multivitamins and will follow up for routine testing of vitamin D, at least 2-3 times per year. She was informed of the risk of over-replacement of vitamin D and agrees to not increase her dose unless she discusses this with [place] first. Madison Palmer agrees to follow up with our clinic in 2 weeks.  I spent > than 50% of the 15 minute visit on counseling as documented in the note.  Obesity Madison Palmer is currently in the action stage of change. As such, her goal is to continue with weight loss efforts She has agreed to follow the Category 2 plan Madison Palmer has been instructed to work up to a goal of 150 minutes of combined cardio and strengthening exercise per week for weight loss and overall health benefits. We discussed the following Behavioral Modification Strategies today: increasing lean protein intake, increasing vegetables and work on meal planning and easy cooking plans, keeping healthy foods in the home, and planning for success Madison Palmer is to MyChart 1 time in the next 2 months.  Madison Palmer has agreed to follow up with our clinic in 2 weeks. She was informed of the importance  of frequent follow up visits to maximize her success with intensive lifestyle modifications for her multiple health conditions.  ALLERGIES: Allergies  Allergen Reactions  . Fentanyl Hives  . Levofloxacin Hives  . Midazolam Hives  . Pollen Extract     seasonal  . Atorvastatin     Muscle pain in legs   . Doxycycline Itching and Swelling  . Hydralazine Hcl Other (See Comments)    Hypercalcemia   . Rosuvastatin     Abdominal pain    MEDICATIONS: Current Outpatient Medications on File Prior to Visit   Medication Sig Dispense Refill  . acetaminophen (TYLENOL) 500 MG tablet Take 1,000 mg by mouth 3 (three) times daily as needed for moderate pain or headache.    Marland Kitchen amLODipine (NORVASC) 10 MG tablet Take 1 tablet (10 mg total) by mouth daily. 90 tablet 1  . cetirizine (ZYRTEC ALLERGY) 10 MG tablet Take 1 tablet (10 mg total) by mouth daily. 30 tablet 5  . cloNIDine (CATAPRES) 0.2 MG tablet Take 1 tablet (0.2 mg total) by mouth 2 (two) times daily. Needs office visit with Dr. Percival Spanish for additional refills 180 tablet 0  . divalproex (DEPAKOTE) 250 MG DR tablet Take 1 tablet (250 mg total) by mouth 2 (two) times daily. 60 tablet 1  . esomeprazole (NEXIUM) 40 MG capsule TAKE 1 CAPSULE BY MOUTH TWICE DAILY 180 capsule 2  . fenofibrate (TRICOR) 145 MG tablet Take 1 tablet (145 mg total) by mouth daily. 90 tablet 1  . fluticasone (FLONASE) 50 MCG/ACT nasal spray Place 2 sprays into both nostrils daily.    . furosemide (LASIX) 40 MG tablet Take 1 tablet (40 mg total) by mouth daily.    . hydrALAZINE (APRESOLINE) 50 MG tablet Take 2 tablets (100 mg total) by mouth 2 (two) times daily. 120 tablet 5  . ibuprofen (ADVIL) 200 MG tablet Take 400 mg by mouth every 6 (six) hours as needed for moderate pain.    Marland Kitchen levothyroxine (SYNTHROID) 75 MCG tablet Take 1 tablet (75 mcg total) by mouth daily before breakfast. Monday-Friday only 60 tablet 1  . lisinopril (ZESTRIL) 40 MG tablet Take 1 tablet (40 mg total) by mouth daily. 90 tablet 1  . Lurasidone HCl 60 MG TABS Take 1 tablet (60 mg total) by mouth daily after supper. 30 tablet 1  . metFORMIN (GLUCOPHAGE) 500 MG tablet Take 1 tablet (500 mg total) by mouth 2 (two) times daily with a meal. 180 tablet 1  . perphenazine (TRILAFON) 2 MG tablet Take 1 tablet (2 mg total) by mouth daily. 30 tablet 1  . potassium chloride SA (K-DUR,KLOR-CON) 20 MEQ tablet Take 2 tablets (40 mEq total) by mouth daily. 180 tablet 2  . Vitamin D, Ergocalciferol, (DRISDOL) 1.25 MG (50000  UT) CAPS capsule TAKE 1 CAPSULE BY MOUTH EVERY 7 DAYS 12 capsule 0  . labetalol (NORMODYNE) 300 MG tablet Take 2 tablets (600 mg total) by mouth 2 (two) times a day. 360 tablet 1   No current facility-administered medications on file prior to visit.     PAST MEDICAL HISTORY: Past Medical History:  Diagnosis Date  . Anemia   . Anxiety   . Arrhythmia    tachycardia  . Arthritis   . Chickenpox   . Depression   . Diverticulitis   . GERD (gastroesophageal reflux disease)   . Glaucoma   . Hyperlipidemia   . Hyperparathyroidism ([place])   . Hypertension   . Inflammatory polyps of colon ([place])   .  LVH (left ventricular hypertrophy)   . Lymphedema   . PCOS (polycystic ovarian syndrome)   . Prediabetes   . Recurrent UTI   . Sleep apnea    CPAP  . Thyroid disease   . Vitamin D deficiency     PAST SURGICAL HISTORY: Past Surgical History:  Procedure Laterality Date  . BREAST BIOPSY  2015  . CESAREAN SECTION  2004  . INNER EAR SURGERY     ear and sinus surgery  . LAPAROSCOPIC REPAIR AND REMOVAL OF GASTRIC BAND    . OOPHORECTOMY Left   . TONSILLECTOMY AND ADENOIDECTOMY      SOCIAL HISTORY: Social History   Tobacco Use  . Smoking status: Never Smoker  . Smokeless tobacco: Never Used  Substance Use Topics  . Alcohol use: Yes    Comment: social  . Drug use: No    FAMILY HISTORY: Family History  Adopted: Yes  Problem Relation Age of Onset  . Hearing loss Son        right   . Healthy Son     ROS: Review of Systems  Constitutional: Positive for weight loss.  Gastrointestinal: Negative for nausea and vomiting.  Musculoskeletal:       Negative muscle weakness  Endo/Heme/Allergies:       Negative hypoglycemia    PHYSICAL EXAM: Blood pressure 129/80, pulse 82, temperature 98.1 F (36.7 C), temperature source Oral, height 5\' 2"  (1.575 m), weight 292 lb (132.5 kg), last menstrual period 05/02/2017, SpO2 95 %. Body mass index is 53.41 kg/m. Physical Exam Vitals  signs reviewed.  Constitutional:      Appearance: Normal appearance. She is obese.  Cardiovascular:     Rate and Rhythm: Normal rate.     Pulses: Normal pulses.  Pulmonary:     Effort: Pulmonary effort is normal.     Breath sounds: Normal breath sounds.  Musculoskeletal: Normal range of motion.  Skin:    General: Skin is warm and dry.  Neurological:     Mental Status: She is alert and oriented to person, place, and time.  Psychiatric:        Mood and Affect: Mood normal.        Behavior: Behavior normal.     RECENT LABS AND TESTS: BMET    Component Value Date/Time   NA 144 12/30/2018 1022   K 4.8 12/30/2018 1022   CL 100 12/30/2018 1022   CO2 30 (H) 12/30/2018 1022   GLUCOSE 128 (H) 12/30/2018 1022   GLUCOSE 110 (H) 12/24/2018 1402   BUN 23 12/30/2018 1022   CREATININE 1.30 (H) 12/30/2018 1022   CREATININE 1.13 (H) 12/24/2018 1402   CALCIUM 10.5 (H) 12/30/2018 1022   GFRNONAA 47 (L) 12/30/2018 1022   GFRAA 55 (L) 12/30/2018 1022   Lab Results  Component Value Date   HGBA1C 5.7 (H) 12/24/2018   HGBA1C 6.0 (H) 02/27/2018   HGBA1C 5.7 09/10/2017   HGBA1C 5.9 05/29/2017   Lab Results  Component Value Date   INSULIN 21.1 12/30/2018   INSULIN 9.6 02/27/2018   CBC    Component Value Date/Time   WBC 5.4 09/25/2018 0313   RBC 4.27 09/25/2018 0313   HGB 12.7 09/25/2018 0313   HCT 40.4 09/25/2018 0313   PLT 229 09/25/2018 0313   MCV 94.6 09/25/2018 0313   MCH 29.7 09/25/2018 0313   MCHC 31.4 09/25/2018 0313   RDW 14.2 09/25/2018 0313   LYMPHSABS 1.1 09/25/2018 0313   MONOABS 0.6 09/25/2018 0313  EOSABS 0.1 09/25/2018 0313   BASOSABS 0.0 09/25/2018 0313   Iron/TIBC/Ferritin/ %Sat    Component Value Date/Time   IRON 44 02/20/2018 1536   IRON 81 05/29/2017   TIBC 347 05/29/2017   IRONPCTSAT 8.8 (L) 02/20/2018 1536   Lipid Panel     Component Value Date/Time   CHOL 255 (H) 12/30/2018 1022   TRIG 106 12/30/2018 1022   HDL 50 12/30/2018 1022   CHOLHDL  5.6 (H) 12/24/2018 1402   VLDL 26.6 09/10/2017 1156   LDLCALC 186 (H) 12/30/2018 1022   LDLCALC 181 (H) 12/24/2018 1402   Hepatic Function Panel     Component Value Date/Time   PROT 6.6 12/30/2018 1022   ALBUMIN 4.1 12/30/2018 1022   AST 13 12/30/2018 1022   ALT 16 12/30/2018 1022   ALKPHOS 102 12/30/2018 1022   BILITOT 0.7 12/30/2018 1022   BILIDIR 0.1 12/24/2018 1402   IBILI 0.4 12/24/2018 1402      Component Value Date/Time   TSH 1.89 12/24/2018 1402   TSH 0.409 09/16/2018 0301   TSH 0.938 05/11/2018 0630   TSH 1.450 02/27/2018 1233   TSH 0.97 12/11/2017 1158      OBESITY BEHAVIORAL INTERVENTION VISIT  Today's visit was # 18   Starting weight: 357 lbs Starting date: 02/27/18 Today's weight :292 lbs Today's date: 02/13/2019 Total lbs lost to date: 75    ASK: We discussed the diagnosis of obesity with [name] today and Shanyn agreed to give [place] permission to discuss obesity behavioral modification therapy today.  ASSESS: Kidada has the diagnosis of obesity and her BMI today is 53.39 Tekia is in the action stage of change   ADVISE: Zanari was educated on the multiple health risks of obesity as well as the benefit of weight loss to improve her health. She was advised of the need for long term treatment and the importance of lifestyle modifications to improve her current health and to decrease her risk of future health problems.  AGREE: Multiple dietary modification options and treatment options were discussed and  Jahnya agreed to follow the recommendations documented in the above note.  ARRANGE: Tris was educated on the importance of frequent visits to treat obesity as outlined per CMS and USPSTF guidelines and agreed to schedule her next follow up appointment today.  I, Trixie Dredge, am acting as transcriptionist for Ilene Qua, MD  I have reviewed the above documentation for accuracy and completeness, and I agree with the above. -  Ilene Qua, MD

## 2019-02-19 ENCOUNTER — Encounter: Payer: Self-pay | Admitting: Nurse Practitioner

## 2019-02-20 ENCOUNTER — Ambulatory Visit (INDEPENDENT_AMBULATORY_CARE_PROVIDER_SITE_OTHER): Payer: BC Managed Care – PPO

## 2019-02-20 ENCOUNTER — Other Ambulatory Visit: Payer: Self-pay

## 2019-02-20 DIAGNOSIS — Z23 Encounter for immunization: Secondary | ICD-10-CM | POA: Diagnosis not present

## 2019-02-20 NOTE — Progress Notes (Signed)
Patient here for Flu injection.  Injection given by Verline Lema Manaia Samad, RMA in the [facility]. ([facility])  Patient tolerated well.  Flu information sheet given.

## 2019-02-28 ENCOUNTER — Encounter: Payer: Self-pay | Admitting: Nurse Practitioner

## 2019-03-24 ENCOUNTER — Encounter (HOSPITAL_COMMUNITY): Payer: Self-pay | Admitting: Psychiatry

## 2019-03-24 ENCOUNTER — Other Ambulatory Visit: Payer: Self-pay

## 2019-03-24 ENCOUNTER — Ambulatory Visit (INDEPENDENT_AMBULATORY_CARE_PROVIDER_SITE_OTHER): Payer: BC Managed Care – PPO | Admitting: Psychiatry

## 2019-03-24 DIAGNOSIS — F319 Bipolar disorder, unspecified: Secondary | ICD-10-CM

## 2019-03-24 DIAGNOSIS — F419 Anxiety disorder, unspecified: Secondary | ICD-10-CM | POA: Diagnosis not present

## 2019-03-24 MED ORDER — DIVALPROEX SODIUM 250 MG PO DR TAB: 250.0000 mg | DELAYED_RELEASE_TABLET | Freq: Two times a day (BID) | ORAL | 1 refills | Status: DC

## 2019-03-24 MED ORDER — LURASIDONE HCL 60 MG PO TABS: 60.0000 mg | ORAL_TABLET | Freq: Every day | ORAL | 1 refills | Status: DC

## 2019-03-24 NOTE — Progress Notes (Signed)
Virtual Visit via Telephone Note  I connected with Madison Palmer on 03/24/19 at 10:40 AM EST by telephone and verified that I am speaking with the correct person using two identifiers.   I discussed the limitations, risks, security and privacy concerns of performing an evaluation and management service by telephone and the availability of in person appointments. I also discussed with the patient that there may be a patient responsible charge related to this service. The patient expressed understanding and agreed to proceed.   History of Present Illness: Patient was evaluated by phone session.  Her attention, concentration and memory is much better since Trilafon reduced.  She also feel less anxious and less depressed.  Patient is now moved back to [place] and she lives by herself however her ex-husband and son helps her a lot.  Patient sister came to [place] and drove her back to [place].  She is much comfortable.  She denies any irritability, anger, mania or any psychosis.  She has upcoming appointment with PCP this Friday and she is hoping she can get referral to see a psychiatrist in [place].  She is tolerating her medication and reported no side effects.  She talked about her license and I suggested that she should wait to see her new psychiatrist to fill the local DMV forms.  She agree with the plan.  She has no tremors shakes or any EPS.  She admitted her symptoms are slowly getting better.  Her energy level is improved.  She denies any mania or any severe anger.   Past psychiatric history; H/Odepression since early age and seen on and off therapist whileliving in [place].Donot recall seeing psychiatrist untiladmitted [facility] in May [facility] for delusion, psychosis and violent behavior. D/C on Depakote 500 mg BID, Latuda 60 mg daily and Trilafon 12 mg a day. No h/osuicidalattempt,nightmares, panic attack or OCD symptoms.No h/odrinking and drug  use.   Psychiatric Specialty Exam: Physical Exam  ROS  Last menstrual period 05/02/2017.There is no height or weight on file to calculate BMI.  General Appearance: NA  Eye Contact:  NA  Speech:  Clear and Coherent  Volume:  Decreased  Mood:  Euthymic and tired  Affect:  NA  Thought Process:  Goal Directed  Orientation:  Full (Time, Place, and Person)  Thought Content:  WDL  Suicidal Thoughts:  No  Homicidal Thoughts:  No  Memory:  Immediate;   Good Recent;   Good Remote;   Fair  Judgement:  Good  Insight:  Good  Psychomotor Activity:  NA  Concentration:  Concentration: Fair and Attention Span: Fair  Recall:  Good  Fund of Knowledge:  Good  Language:  Good  Akathisia:  No  Handed:  Right  AIMS (if indicated):     Assets:  Communication Skills Desire for Improvement Housing Resilience Social Support  ADL's:  Intact  Cognition:  WNL  Sleep:   ok      Assessment and Plan: Bipolar disorder type I.  Anxiety.  Patient reported much improvement since Trilafon dose reduced.  She is still taking Latuda and Depakote.  I recommend to discontinue Trilafon since her irritability, mood, attention, concentration and memory is improved.  She is still on Latuda and Depakote which will help her mood and manic symptoms.  I strongly encouraged that she should find a local psychiatrist for future medication refill however we will provide medication until she get appointment to see psychiatrist.  I also encouraged she should continue appointment  to see her PCP for blood work.  I recommended to call us back if she has any question of any concern.  Follow-up in 2 months.  She will only take Latuda 60 mg daily and Depakote to 50 mg twice a day.  Follow Up Instructions:    I discussed the assessment and treatment plan with the patient. The patient was provided an opportunity to ask questions and all were answered. The patient agreed with the plan and demonstrated an understanding of the  instructions.   The patient was advised to call back or seek an in-person evaluation if the symptoms worsen or if the condition fails to improve as anticipated.  I provided 20 minutes of non-face-to-face time during this encounter.   Kathlee Nations, MD

## 2019-03-25 ENCOUNTER — Other Ambulatory Visit: Payer: Self-pay | Admitting: Nurse Practitioner

## 2019-03-25 ENCOUNTER — Ambulatory Visit: Payer: BC Managed Care – PPO | Admitting: Nurse Practitioner

## 2019-03-25 DIAGNOSIS — J309 Allergic rhinitis, unspecified: Secondary | ICD-10-CM

## 2019-03-25 MED ORDER — CETIRIZINE HCL 10 MG PO TABS: 10.0000 mg | ORAL_TABLET | Freq: Every day | ORAL | 1 refills | Status: DC

## 2019-03-26 ENCOUNTER — Ambulatory Visit: Payer: BC Managed Care – PPO | Admitting: Nurse Practitioner

## 2019-05-20 ENCOUNTER — Ambulatory Visit (INDEPENDENT_AMBULATORY_CARE_PROVIDER_SITE_OTHER): Payer: BC Managed Care – PPO | Admitting: Psychiatry

## 2019-05-20 ENCOUNTER — Encounter (HOSPITAL_COMMUNITY): Payer: Self-pay | Admitting: Psychiatry

## 2019-05-20 ENCOUNTER — Other Ambulatory Visit: Payer: Self-pay

## 2019-05-20 DIAGNOSIS — F319 Bipolar disorder, unspecified: Secondary | ICD-10-CM | POA: Diagnosis not present

## 2019-05-20 DIAGNOSIS — F419 Anxiety disorder, unspecified: Secondary | ICD-10-CM | POA: Diagnosis not present

## 2019-05-20 MED ORDER — LURASIDONE HCL 60 MG PO TABS: 60.0000 mg | ORAL_TABLET | Freq: Every day | ORAL | 1 refills | Status: DC

## 2019-05-20 MED ORDER — DIVALPROEX SODIUM 250 MG PO DR TAB: 250.0000 mg | DELAYED_RELEASE_TABLET | Freq: Two times a day (BID) | ORAL | 1 refills | Status: DC

## 2019-05-20 NOTE — Progress Notes (Signed)
Virtual Visit via Telephone Note  I connected with Madison Palmer on 05/20/19 at 10:00 AM EST by telephone and verified that I am speaking with the correct person using two identifiers.   I discussed the limitations, risks, security and privacy concerns of performing an evaluation and management service by telephone and the availability of in person appointments. I also discussed with the patient that there may be a patient responsible charge related to this service. The patient expressed understanding and agreed to proceed.   History of Present Illness: Patient was evaluated by phone session.  She is doing much better on her medication.  She had a good Christmas with her ex husband and son.  Patient moved to [place] but is still had not find a psychiatrist.  She did have PCP and she was referred to see psychiatrist and waiting to hear back from them.  She feels the current medicine is working.  She denies any paranoia, hallucination, anger or any suicidal thoughts.  Her attention and concentration is better.  She is no longer taking Trilafon.  She is taking Depakote and Latuda.  She is sleeping better.  She like to continue virtual visits until she sees psychiatrist in [place].  She has no tremors, shakes or any EPS.  She had a good support from her son and ex-husband.  Patient denies drinking or using any illegal substances.   Past psychiatric history; H/Odepression since early age and seen on and off therapist whileliving in [place].Donot recall seeing psychiatrist untiladmitted at [facility] in May 2020[facility] for delusion, psychosis and violent behavior. D/C on Depakote 500 mg BID, Latuda 60 mg daily and Trilafon 12 mg a day. No h/osuicidalattempt,nightmares, panic attack or OCD symptoms.No h/odrinking and drug use.  Psychiatric Specialty Exam: Physical Exam  Review of Systems  Weight 293 lb (132.9 kg), last menstrual period 05/02/2017.Body mass index is 53.59  kg/m.  General Appearance: NA  Eye Contact:  NA  Speech:  Clear and Coherent and Slow  Volume:  Decreased  Mood:  Euthymic  Affect:  NA  Thought Process:  Descriptions of Associations: Intact  Orientation:  Full (Time, Place, and Person)  Thought Content:  WDL  Suicidal Thoughts:  No  Homicidal Thoughts:  No  Memory:  Immediate;   Fair Recent;   Good Remote;   Good  Judgement:  Intact  Insight:  Present  Psychomotor Activity:  NA  Concentration:  Concentration: Fair and Attention Span: Fair  Recall:  Good  Fund of Knowledge:  Good  Language:  Good  Akathisia:  No  Handed:  Right  AIMS (if indicated):     Assets:  Communication Skills Desire for Improvement Housing Resilience Social Support  ADL's:  Intact  Cognition:  WNL  Sleep:   ok      Assessment and Plan: Bipolar disorder type I.  Anxiety.  Patient doing better on her medication.  She like to keep virtual visits until she find psychiatrist in [place].  I will continue Latuda 60 mg daily and Depakote 250 mg twice a day.  Discussed medication side effects and benefits.  I recommend that she need a blood work to check her Depakote level and she will contact her PCP to have blood work.  I recommend to call us back if there is any question of any concern.  We will schedule appointment in 2 months.  Follow Up Instructions:    I discussed the assessment and treatment plan with the patient. The patient was  provided an opportunity to ask questions and all were answered. The patient agreed with the plan and demonstrated an understanding of the instructions.   The patient was advised to call back or seek an in-person evaluation if the symptoms worsen or if the condition fails to improve as anticipated.  I provided 20 minutes of non-face-to-face time during this encounter.   Kathlee Nations, MD

## 2019-05-24 NOTE — Progress Notes (Signed)
Virtual Visit via Telephone Note   This visit type was conducted due to national recommendations for restrictions regarding the COVID-19 Pandemic (e.g. social distancing) in an effort to limit this patient's exposure and mitigate transmission in our community.  Due to her co-morbid illnesses, this patient is at least at moderate risk for complications without adequate follow up.  This format is felt to be most appropriate for this patient at this time.  The patient did not have access to video technology/had technical difficulties with video requiring transitioning to audio format only (telephone).  All issues noted in this document were discussed and addressed.  No physical exam could be performed with this format.  Please refer to the patient's chart for her  consent to telehealth for [facility].   Date:  05/26/2019   ID:  Madison Palmer, DOB [birth year], MRN [id number]  Patient Location: Home Provider Location: Home  PCP:  Flossie Buffy, NP  Cardiologist:  Minus Breeding, MD  Electrophysiologist:  None   Evaluation Performed:  Follow-Up Visit  Chief Complaint:  Follow Up  History of Present Illness:    Madison Palmer is a 54 y.o. female we are following for ongoing assessment and management of chronic shortness of breath, hypertension, chronic diastolic CHF, with other history to include bipolar disorder.  She has moved to [facility], and will be there until the summer. She is being followed by cardiologist there, Dr. Aris Lot, in [place], [place].  Medications have been adjusted for BP control as she was having hypotension.  She states that her BP has been running [id number] to [id number] systolic. She is without complaints of chest pain or dyspnea. She is medically complaint.   The patient does not have symptoms concerning for COVID-19 infection (fever, chills, cough, or new shortness of breath).    Past Medical History:  Diagnosis Date  . Anemia   . Anxiety   . Arrhythmia      tachycardia  . Arthritis   . Chickenpox   . Depression   . Diverticulitis   . GERD (gastroesophageal reflux disease)   . Glaucoma   . Hyperlipidemia   . Hyperparathyroidism ([place])   . Hypertension   . Inflammatory polyps of colon ([place])   . LVH (left ventricular hypertrophy)   . Lymphedema   . PCOS (polycystic ovarian syndrome)   . Prediabetes   . Recurrent UTI   . Sleep apnea    CPAP  . Thyroid disease   . Vitamin D deficiency    Past Surgical History:  Procedure Laterality Date  . BREAST BIOPSY  2015  . CESAREAN SECTION  2004  . INNER EAR SURGERY     ear and sinus surgery  . LAPAROSCOPIC REPAIR AND REMOVAL OF GASTRIC BAND    . OOPHORECTOMY Left   . TONSILLECTOMY AND ADENOIDECTOMY       Current Meds  Medication Sig  . acetaminophen (TYLENOL) 500 MG tablet Take 1,000 mg by mouth 3 (three) times daily as needed for moderate pain or headache.  Marland Kitchen amLODipine (NORVASC) 10 MG tablet Take 1 tablet (10 mg total) by mouth daily.  . cetirizine (ZYRTEC ALLERGY) 10 MG tablet Take 1 tablet (10 mg total) by mouth daily.  . cloNIDine (CATAPRES) 0.2 MG tablet Take 1 tablet (0.2 mg total) by mouth 2 (two) times daily. Needs office visit with Dr. Percival Spanish for additional refills (Patient taking differently: Take 0.1 mg by mouth 2 (two) times daily. Needs office visit with Dr. Percival Spanish  for additional refills)  . divalproex (DEPAKOTE) 250 MG DR tablet Take 1 tablet (250 mg total) by mouth 2 (two) times daily.  Marland Kitchen doxycycline (DORYX) 100 MG EC tablet Take 100 mg by mouth 2 (two) times daily. Take for 7 days  . esomeprazole (NEXIUM) 40 MG capsule TAKE 1 CAPSULE BY MOUTH TWICE DAILY  . fenofibrate (TRICOR) 145 MG tablet Take 1 tablet (145 mg total) by mouth daily.  . fluticasone (FLONASE) 50 MCG/ACT nasal spray Place 2 sprays into both nostrils daily.  . furosemide (LASIX) 40 MG tablet Take 1 tablet (40 mg total) by mouth daily.  Marland Kitchen ibuprofen (ADVIL) 200 MG tablet Take 400 mg by mouth every 6  (six) hours as needed for moderate pain.  Marland Kitchen levothyroxine (SYNTHROID) 75 MCG tablet Take 1 tablet (75 mcg total) by mouth daily before breakfast. Monday-Friday only  . lisinopril (ZESTRIL) 40 MG tablet Take 1 tablet (40 mg total) by mouth daily.  . Lurasidone HCl 60 MG TABS Take 1 tablet (60 mg total) by mouth daily after supper.  . metFORMIN (GLUCOPHAGE) 500 MG tablet Take 1 tablet (500 mg total) by mouth 2 (two) times daily with a meal.  . potassium chloride SA (K-DUR,KLOR-CON) 20 MEQ tablet Take 2 tablets (40 mEq total) by mouth daily.  . Vitamin D, Ergocalciferol, (DRISDOL) 1.25 MG (50000 UT) CAPS capsule TAKE 1 CAPSULE BY MOUTH EVERY 7 DAYS     Allergies:   Fentanyl, Levofloxacin, Midazolam, Pollen extract, Atorvastatin, Doxycycline, Hydralazine hcl, and Rosuvastatin   Social History   Tobacco Use  . Smoking status: Never Smoker  . Smokeless tobacco: Never Used  Substance Use Topics  . Alcohol use: Yes    Comment: social  . Drug use: No     Family Hx: The patient's family history includes Healthy in her son; Hearing loss in her son. She was adopted.  ROS:   Please see the history of present illness.    All other systems reviewed and are negative.   Prior CV studies:   The following studies were reviewed today: Myocardial Amyloid Imaging: 05/14/2018  Study quality Overall image quality is excellent.  There is no nuclear artifact present.  Amyloid study findings By semi-quantitative assessment there is Grade 1 that represents uptake less than rib uptake. By quantitative findings, heart to contralateral lung ratio is <1.0.  Amyloid study Impression The study is normal. The study is not suggestive of TTR amyloidosis (visual score of 0 or H/CL ratio<1).      Labs/Other Tests and Data Reviewed:    EKG:  No ECG reviewed.  Recent Labs: 09/15/2018: B Natriuretic Peptide 147.2 09/25/2018: Hemoglobin 12.7; Magnesium 2.1; Platelets 229 12/24/2018: TSH 1.89 12/30/2018: ALT 16; BUN  23; Creatinine, Ser 1.30; Potassium 4.8; Sodium 144   Recent Lipid Panel Lab Results  Component Value Date/Time   CHOL 255 (H) 12/30/2018 10:22 AM   TRIG 106 12/30/2018 10:22 AM   HDL 50 12/30/2018 10:22 AM   CHOLHDL 5.6 (H) 12/24/2018 02:02 PM   LDLCALC 186 (H) 12/30/2018 10:22 AM   LDLCALC 181 (H) 12/24/2018 02:02 PM    Wt Readings from Last 3 Encounters:  05/26/19 293 lb (132.9 kg)  02/13/19 292 lb (132.5 kg)  01/27/19 293 lb (132.9 kg)     Objective:    Vital Signs:  BP (!) 146/102   Pulse 77   Ht 5\' 3"  (1.6 m)   Wt 293 lb (132.9 kg)   LMP 05/02/2017   BMI 51.90 kg/m  Limited due to telephone visit.   VITAL SIGNS:  reviewed GEN:  no acute distress NEURO:  alert and oriented x 3, no obvious focal deficit PSYCH:  normal affect  ASSESSMENT & PLAN:    1. Hypertension: BP has been well controlled based on her reports. She is living in [place] now and is being followed by a cardiologist. She wants to continue to be followed by our practice as well, as she will be returning to [place] in the summer. No changes to her medication regimen.   2. Chronic Diastolic CHF: She does not report any edema.,or DOE. She remains on lasix as directed. No changes to her regimen.   3. OSA: Compliant with CPAP  COVID-19 Education: The signs and symptoms of COVID-19 were discussed with the patient and how to seek care for testing (follow up with PCP or arrange E-visit).  The importance of social distancing was discussed today.  Time:   Today, I have spent 15 minutes with the patient with telehealth technology discussing the above problems.     Medication Adjustments/Labs and Tests Ordered: Current medicines are reviewed at length with the patient today.  Concerns regarding medicines are outlined above.   Tests Ordered: No orders of the defined types were placed in this encounter.   Medication Changes: No orders of the defined types were placed in this  encounter.   Disposition:  Follow up 6 months   Signed, Phill Myron. West Pugh, ANP, AACC  05/26/2019 10:21 AM    [facility]

## 2019-05-26 ENCOUNTER — Telehealth (INDEPENDENT_AMBULATORY_CARE_PROVIDER_SITE_OTHER): Payer: BC Managed Care – PPO | Admitting: Adult Health

## 2019-05-26 ENCOUNTER — Encounter: Payer: Self-pay | Admitting: Adult Health

## 2019-05-26 VITALS — BP 146/102 | HR 77 | Ht 63.0 in | Wt 293.0 lb

## 2019-05-26 DIAGNOSIS — I5032 Chronic diastolic (congestive) heart failure: Secondary | ICD-10-CM

## 2019-05-26 DIAGNOSIS — G4733 Obstructive sleep apnea (adult) (pediatric): Secondary | ICD-10-CM

## 2019-05-26 DIAGNOSIS — I10 Essential (primary) hypertension: Secondary | ICD-10-CM

## 2019-05-26 NOTE — Patient Instructions (Addendum)
Medication Instructions:  Your physician recommends that you continue on your current medications as directed. Please refer to the Current Medication list given to you today.  If you need a refill on your cardiac medications before your next appointment, please call your pharmacy.   Lab work: NONE  Testing/Procedures: NONE  Follow-Up: At [facility], you and your health needs are our priority.  As part of our continuing mission to provide you with exceptional heart care, we have created designated Provider Care Teams.  These Care Teams include your primary Cardiologist (physician) and Advanced Practice Providers (APPs -  Physician Assistants and Nurse Practitioners) who all work together to provide you with the care you need, when you need it. You may see Minus Breeding, MD or one of the following Advanced Practice Providers on your designated Care Team:    Kerin Ransom, PA-C  [place], [place]  Coletta Memos, [place]  Your physician recommends that you schedule a follow-up appointment in: 6 months with Dr. Percival Spanish

## 2019-05-29 ENCOUNTER — Other Ambulatory Visit: Payer: Self-pay

## 2019-05-29 ENCOUNTER — Encounter (INDEPENDENT_AMBULATORY_CARE_PROVIDER_SITE_OTHER): Payer: Self-pay | Admitting: Family Medicine

## 2019-05-29 ENCOUNTER — Ambulatory Visit (INDEPENDENT_AMBULATORY_CARE_PROVIDER_SITE_OTHER): Payer: BC Managed Care – PPO | Admitting: Family Medicine

## 2019-05-29 VITALS — BP 141/85 | HR 76 | Temp 98.2°F | Ht 62.0 in | Wt 297.0 lb

## 2019-05-29 DIAGNOSIS — R7303 Prediabetes: Secondary | ICD-10-CM

## 2019-05-29 DIAGNOSIS — E559 Vitamin D deficiency, unspecified: Secondary | ICD-10-CM | POA: Diagnosis not present

## 2019-05-29 DIAGNOSIS — Z6841 Body Mass Index (BMI) 40.0 and over, adult: Secondary | ICD-10-CM

## 2019-05-29 NOTE — Progress Notes (Signed)
Chief Complaint:   OBESITY Madison Palmer is here to discuss her progress with her obesity treatment plan along with follow-up of her obesity related diagnoses. Madison Palmer is on the Category 2 Plan and states she is following her eating plan approximately 40% of the time. Madison Palmer states she is exercising 0 minutes 0 times per week.  Today's visit was #: 100 Starting weight: 357 lbs Starting date: 02/27/2018 Today's weight: 297 lbs Today's date: 05/29/2019 Total lbs lost to date: 60 Total lbs lost since last in-office visit: 0  Interim History: Madison Palmer returns to the clinic after three months, secondary to a move to [place]. Her son is living with her, in her apartment. She has someone cooking for her, but she is ordering out quite a bit, and almost daily. She is going back to [place] in the next week. Madison Palmer is moving back in July to [place].  Subjective:   Vitamin D deficiency Madison Palmer's Vitamin D level was 36.5 on 12/30/18. She is not currently taking vit D. She admits fatigue and denies nausea, vomiting or muscle weakness.  Prediabetes Madison Palmer has a diagnosis of prediabetes based on her elevated Hgb A1c and was informed this puts her at greater risk of developing diabetes. Her last A1c was at 5.7 (12/24/18). Madison Palmer is on metformin twice daily. She continues to work on diet and exercise to decrease her risk of diabetes. She denies nausea or hypoglycemia.  Lab Results  Component Value Date   HGBA1C 5.7 (H) 12/24/2018   Lab Results  Component Value Date   INSULIN 21.1 12/30/2018   INSULIN 9.6 02/27/2018    Ref. Range 12/30/2018 10:22  Vitamin D, 25-Hydroxy Latest Ref Range: 30.0 - 100.0 ng/mL 36.5    Assessment/Plan:   Vitamin D deficiency Low Vitamin D level contributes to fatigue and are associated with obesity, breast, and colon cancer. Madison Palmer was encouraged to take OTC vitamin D 5,000 IU daily.  Prediabetes Madison Palmer will continue to work on weight loss, exercise, and  decreasing simple carbohydrates to help decrease the risk of diabetes. Madison Palmer will follow up labs at her endocrinologist appointment tomorrow.  Class 3 severe obesity with serious comorbidity and body mass index (BMI) of 50.0 to 59.9 in adult, unspecified obesity type (HCC) Madison Palmer is currently in the action stage of change. As such, her goal is to continue with weight loss efforts. She has agreed to keeping a food journal and adhering to recommended goals of 1250 to 1400 calories and 85+ grams of protein daily.   Exercise goals: No exercise has been prescribed at this time.  Behavioral modification strategies: increasing lean protein intake, increasing vegetables, meal planning and cooking strategies and planning for success.  Madison Palmer has agreed to follow-up with our clinic in July. She was informed of the importance of frequent follow-up visits to maximize her success with intensive lifestyle modifications for her multiple health conditions.   Objective:   Blood pressure (!) 141/85, pulse 76, temperature 98.2 F (36.8 C), temperature source Oral, height 5\' 2"  (1.575 m), weight 297 lb (134.7 kg), last menstrual period 05/02/2017, SpO2 98 %. Body mass index is 54.32 kg/m.  General: Cooperative, alert, well developed, in no acute distress. HEENT: Conjunctivae and lids unremarkable. Cardiovascular: Regular rhythm.  Lungs: Normal work of breathing. Neurologic: No focal deficits.   Lab Results  Component Value Date   CREATININE 1.30 (H) 12/30/2018   BUN 23 12/30/2018   NA 144 12/30/2018   K 4.8 12/30/2018   CL 100  12/30/2018   CO2 30 (H) 12/30/2018   Lab Results  Component Value Date   ALT 16 12/30/2018   AST 13 12/30/2018   ALKPHOS 102 12/30/2018   BILITOT 0.7 12/30/2018   Lab Results  Component Value Date   HGBA1C 5.7 (H) 12/24/2018   HGBA1C 6.0 (H) 02/27/2018   HGBA1C 5.7 09/10/2017   HGBA1C 5.9 05/29/2017   Lab Results  Component Value Date   INSULIN 21.1 12/30/2018    INSULIN 9.6 02/27/2018   Lab Results  Component Value Date   TSH 1.89 12/24/2018   Lab Results  Component Value Date   CHOL 255 (H) 12/30/2018   HDL 50 12/30/2018   LDLCALC 186 (H) 12/30/2018   TRIG 106 12/30/2018   CHOLHDL 5.6 (H) 12/24/2018   Lab Results  Component Value Date   WBC 5.4 09/25/2018   HGB 12.7 09/25/2018   HCT 40.4 09/25/2018   MCV 94.6 09/25/2018   PLT 229 09/25/2018   Lab Results  Component Value Date   IRON 44 02/20/2018   TIBC 347 05/29/2017    Ref. Range 12/30/2018 10:22  Vitamin D, 25-Hydroxy Latest Ref Range: 30.0 - 100.0 ng/mL 36.5    Attestation Statements:   Reviewed by clinician on day of visit: allergies, medications, problem list, medical history, surgical history, family history, social history, and previous encounter notes.  Time spent on visit including pre-visit chart review and post-visit care was 25 minutes.   I, Doreene Nest, am acting as transcriptionist for Eber Jones, MD.  I have reviewed the above documentation for accuracy and completeness, and I agree with the above. Eber Jones, MD

## 2019-07-17 ENCOUNTER — Ambulatory Visit (INDEPENDENT_AMBULATORY_CARE_PROVIDER_SITE_OTHER): Payer: BC Managed Care – PPO | Admitting: Psychiatry

## 2019-07-17 ENCOUNTER — Encounter (HOSPITAL_COMMUNITY): Payer: Self-pay | Admitting: Psychiatry

## 2019-07-17 ENCOUNTER — Other Ambulatory Visit: Payer: Self-pay

## 2019-07-17 DIAGNOSIS — F319 Bipolar disorder, unspecified: Secondary | ICD-10-CM

## 2019-07-17 DIAGNOSIS — F419 Anxiety disorder, unspecified: Secondary | ICD-10-CM

## 2019-07-17 MED ORDER — DIVALPROEX SODIUM 250 MG PO DR TAB: 250.0000 mg | DELAYED_RELEASE_TABLET | Freq: Two times a day (BID) | ORAL | 2 refills | Status: DC

## 2019-07-17 MED ORDER — LURASIDONE HCL 60 MG PO TABS: 60.0000 mg | ORAL_TABLET | Freq: Every day | ORAL | 2 refills | Status: DC

## 2019-07-17 NOTE — Progress Notes (Signed)
Virtual Visit via Telephone Note  I connected with Madison Palmer on 07/17/19 at 10:00 AM EDT by telephone and verified that I am speaking with the correct person using two identifiers.   I discussed the limitations, risks, security and privacy concerns of performing an evaluation and management service by telephone and the availability of in person appointments. I also discussed with the patient that there may be a patient responsible charge related to this service. The patient expressed understanding and agreed to proceed.   History of Present Illness: Patient was evaluated by phone session.  She is taking her medication and reported no side effects.  She had decided to move back to [place] in July because she wants her son to start high school and college in [place].  She recently seen a physician in [place] and find out that her hemoglobin A1c is 5.3.  Her physician told not to take Metformin and she is going to stop the Metformin.  Overall she described her mood is stable.  She denies any irritability, anger, mania or any psychosis.  She denies any highs or lows.  She is active and feels that medicines are working very well.  She has no tremors or any EPS.  She lives with her son.  She had a good support from her ex-husband.  She denies any panic attacks.  Past psychiatric history; H/Odepression since early age. H/O inpatient at [facility] in May 20202 for delusion, psychosis and violent behavior. D/C on Depakote 500 mg BID, Latuda 60 mg daily and Trilafon 12 mg a day. No h/osuicidalattempt,nightmares, panic attack or OCD symptoms.No h/odrinking and drug use.   Psychiatric Specialty Exam: Physical Exam  Review of Systems  Weight 295 lb (133.8 kg), last menstrual period 05/02/2017.There is no height or weight on file to calculate BMI.  General Appearance: NA  Eye Contact:  NA  Speech:  Clear and Coherent  Volume:  Normal  Mood:  Euthymic  Affect:  NA   Thought Process:  Goal Directed  Orientation:  Full (Time, Place, and Person)  Thought Content:  WDL  Suicidal Thoughts:  No  Homicidal Thoughts:  No  Memory:  Immediate;   Good Recent;   Good Remote;   Good  Judgement:  Intact  Insight:  Present  Psychomotor Activity:  NA  Concentration:  Concentration: Fair and Attention Span: Fair  Recall:  Good  Fund of Knowledge:  Good  Language:  Good  Akathisia:  No  Handed:  Right  AIMS (if indicated):     Assets:  Communication Skills Desire for Improvement Housing Resilience Social Support  ADL's:  Intact  Cognition:  WNL  Sleep:   good      Assessment and Plan: Bipolar disorder type I.  Anxiety.  Patient like to keep her future appointment here since she had decided to move back to [facility] in July so that her son continue to stay in school.  Continue Latuda 60 mg daily and Depakote 250 mg twice a day.  Discussed medication side effects and benefits.  She will stop Metformin because her last hemoglobin A1c was 5.3.  Recommended to call us back if she has any question of any concern.  Follow-up in 3 months.  We will do Depakote level on her next appointment.  Follow Up Instructions:    I discussed the assessment and treatment plan with the patient. The patient was provided an opportunity to ask questions and all were answered. The patient agreed with the  plan and demonstrated an understanding of the instructions.   The patient was advised to call back or seek an in-person evaluation if the symptoms worsen or if the condition fails to improve as anticipated.  I provided 20 minutes of non-face-to-face time during this encounter.   Kathlee Nations, MD

## 2019-08-17 ENCOUNTER — Other Ambulatory Visit: Payer: Self-pay | Admitting: Gastroenterology

## 2019-08-18 ENCOUNTER — Telehealth (HOSPITAL_COMMUNITY): Payer: Self-pay

## 2019-08-18 DIAGNOSIS — F319 Bipolar disorder, unspecified: Secondary | ICD-10-CM

## 2019-08-18 MED ORDER — DIVALPROEX SODIUM 250 MG PO DR TAB: 250.0000 mg | DELAYED_RELEASE_TABLET | Freq: Two times a day (BID) | ORAL | 0 refills | Status: DC

## 2019-08-18 NOTE — Telephone Encounter (Signed)
Done

## 2019-08-18 NOTE — Telephone Encounter (Signed)
Pt requesting change in rx from 30 day supply to 90 day supply of divalproex 250mg  tabs. If appropriate, please send to [facility] in [place], [place].

## 2019-08-24 ENCOUNTER — Other Ambulatory Visit: Payer: Self-pay | Admitting: Cardiology

## 2019-10-20 ENCOUNTER — Other Ambulatory Visit: Payer: Self-pay

## 2019-10-20 ENCOUNTER — Encounter (HOSPITAL_COMMUNITY): Payer: Self-pay | Admitting: Psychiatry

## 2019-10-20 ENCOUNTER — Telehealth (INDEPENDENT_AMBULATORY_CARE_PROVIDER_SITE_OTHER): Payer: BC Managed Care – PPO | Admitting: Psychiatry

## 2019-10-20 DIAGNOSIS — F319 Bipolar disorder, unspecified: Secondary | ICD-10-CM

## 2019-10-20 DIAGNOSIS — F419 Anxiety disorder, unspecified: Secondary | ICD-10-CM

## 2019-10-20 MED ORDER — LURASIDONE HCL 60 MG PO TABS: 60.0000 mg | ORAL_TABLET | Freq: Every day | ORAL | 2 refills | Status: DC

## 2019-10-20 MED ORDER — DIVALPROEX SODIUM 250 MG PO DR TAB: 250.0000 mg | DELAYED_RELEASE_TABLET | Freq: Two times a day (BID) | ORAL | 0 refills | Status: DC

## 2019-10-20 NOTE — Progress Notes (Signed)
Virtual Visit via Telephone Note  I connected with Madison Palmer on 10/20/19 at 10:00 AM EDT by telephone and verified that I am speaking with the correct person using two identifiers.   I discussed the limitations, risks, security and privacy concerns of performing an evaluation and management service by telephone and the availability of in person appointments. I also discussed with the patient that there may be a patient responsible charge related to this service. The patient expressed understanding and agreed to proceed.   Patient Location: [place] with son Provider Location: Home Office  History of Present Illness: Patient is evaluated by phone session.  Patient is currently in [place] living with her son but hoping to move back to [place] in first week of July.  She is doing well on her medication.  She is no longer taking metformin since her blood sugar is much better and her last hemoglobin A1c was 5.4.  Her antihypertensive medication doses were also reduced.  However she gained weight and not trying to lose weight.  She denies any irritability, anger, mania, psychosis or any hallucination.  She has no tremor shakes or any EPS.  She had a support from her ex-husband.  She is moving back with her son so he can continue school in [place].  Patient does not want to change her psych medication since it is helping her mania, anxiety and depression.  Past psychiatric history; H/Odepression since early age. H/O inpatient at [facility] in May 20202 for delusion, psychosis and violent behavior. D/C on Depakote 500 mg BID, Latuda 60 mg daily and Trilafon 12 mg a day. No h/osuicidalattempt,nightmares, panic attack or OCD symptoms.No h/odrinking and drug use.   Psychiatric Specialty Exam: Physical Exam  Review of Systems  Weight (!) 308 lb (139.7 kg), last menstrual period 05/02/2017.There is no height or weight on file to calculate BMI.  General  Appearance: NA  Eye Contact:  NA  Speech:  Slow  Volume:  Normal  Mood:  Euthymic  Affect:  NA  Thought Process:  Goal Directed  Orientation:  Full (Time, Place, and Person)  Thought Content:  Logical  Suicidal Thoughts:  No  Homicidal Thoughts:  No  Memory:  Immediate;   Good Recent;   Good Remote;   Good  Judgement:  Good  Insight:  Good  Psychomotor Activity:  NA  Concentration:  Concentration: Fair and Attention Span: Fair  Recall:  Good  Fund of Knowledge:  Good  Language:  Good  Akathisia:  No  Handed:  Right  AIMS (if indicated):     Assets:  Communication Skills Desire for Improvement Resilience Social Support  ADL's:  Intact  Cognition:  WNL  Sleep:   good      Assessment and Plan: Bipolar disorder type I.  Anxiety.  Patient is a stable on her current medication.  She is moving back to [place] in first week of July.  Discussed medication side effects and benefits.  Encourage exercise and watch her caloric intake.  She admitted weight gain but also not taking medicine for diabetes since her blood sugar is good.  Continue Latuda 60 mg daily and Depakote 250 mg twice a day.  Recommended to call us back if she is any question or any concern.  Follow-up in 3 months.  We will do Depakote level on her next appointment.  Follow Up Instructions:    I discussed the assessment and treatment plan with the patient. The patient was  provided an opportunity to ask questions and all were answered. The patient agreed with the plan and demonstrated an understanding of the instructions.   The patient was advised to call back or seek an in-person evaluation if the symptoms worsen or if the condition fails to improve as anticipated.  I provided 20 minutes of non-face-to-face time during this encounter.   Kathlee Nations, MD

## 2019-11-06 ENCOUNTER — Other Ambulatory Visit: Payer: Self-pay | Admitting: Cardiology

## 2019-11-10 ENCOUNTER — Other Ambulatory Visit: Payer: Self-pay

## 2019-11-10 NOTE — Telephone Encounter (Signed)
Rx(s) sent to pharmacy electronically.  

## 2019-11-10 NOTE — Telephone Encounter (Signed)
Called pt to confirm still taking prescription, pt verbalized that she is still taking it and needing a refill. Pt of Dr. Percival Spanish.

## 2019-11-15 ENCOUNTER — Other Ambulatory Visit: Payer: Self-pay | Admitting: Cardiology

## 2019-11-17 NOTE — Telephone Encounter (Signed)
Rx(s) sent to pharmacy electronically. Per med list and last note from cardiology PA, patient is taking daily

## 2019-11-25 ENCOUNTER — Ambulatory Visit (INDEPENDENT_AMBULATORY_CARE_PROVIDER_SITE_OTHER): Payer: BC Managed Care – PPO | Admitting: Physician Assistant

## 2019-11-25 ENCOUNTER — Other Ambulatory Visit: Payer: Self-pay

## 2019-11-25 ENCOUNTER — Encounter (INDEPENDENT_AMBULATORY_CARE_PROVIDER_SITE_OTHER): Payer: Self-pay | Admitting: Physician Assistant

## 2019-11-25 VITALS — BP 136/84 | HR 74 | Temp 98.2°F | Ht 62.0 in | Wt 302.0 lb

## 2019-11-25 DIAGNOSIS — Z6841 Body Mass Index (BMI) 40.0 and over, adult: Secondary | ICD-10-CM

## 2019-11-25 DIAGNOSIS — I10 Essential (primary) hypertension: Secondary | ICD-10-CM | POA: Diagnosis not present

## 2019-11-25 DIAGNOSIS — E559 Vitamin D deficiency, unspecified: Secondary | ICD-10-CM

## 2019-11-26 NOTE — Progress Notes (Signed)
Chief Complaint:   OBESITY Madison Palmer is here to discuss her progress with her obesity treatment plan along with follow-up of her obesity related diagnoses. Madison Palmer is on the Category 2 Plan and states she is following her eating plan approximately 0% of the time. Madison Palmer states she is doing PT 2 times per week.  Today's visit was #: 20 Starting weight: 357 lbs Starting date: 02/27/2018 Today's weight: 302 lbs Today's date: 11/25/2019 Total lbs lost to date: 55 Total lbs lost since last in-office visit: 0  Interim History: Madison Palmer just moved back to [place] from [place]. She reports that she needs to go grocery shopping to get foods on the plan as she is ready to restart.  Subjective:   Essential hypertension. Janaisha is on lisinopril, Lasix, clonidine, and Norvasc. Blood pressure is controlled today.  BP Readings from Last 3 Encounters:  11/25/19 (!) 136/84  05/29/19 (!) 141/85  05/26/19 (!) 146/102   Lab Results  Component Value Date   CREATININE 1.30 (H) 12/30/2018   CREATININE 1.13 (H) 12/24/2018   CREATININE 0.77 09/25/2018   Vitamin D deficiency. Madison Palmer is not on Vitamin D supplementation. She is due for labs.   Ref. Range 12/30/2018 10:22  Vitamin D, 25-Hydroxy Latest Ref Range: 30.0 - 100.0 ng/mL 36.5   Assessment/Plan:   Essential hypertension. Madison Palmer is working on healthy weight loss and exercise to improve blood pressure control. We will watch for signs of hypotension as she continues her lifestyle modifications. She will continue her medications as directed.   Vitamin D deficiency. Low Vitamin D level contributes to fatigue and are associated with obesity, breast, and colon cancer. She will follow-up for routine testing of Vitamin D at her next office visit in 2-3 weeks.  Class 3 severe obesity with serious comorbidity and body mass index (BMI) of 50.0 to 59.9 in adult, unspecified obesity type ([place]).  Madison Palmer is currently in the action stage of  change. As such, her goal is to continue with weight loss efforts. She has agreed to the Category 2 Plan.   Exercise goals: For substantial health benefits, adults should do at least 150 minutes (2 hours and 30 minutes) a week of moderate-intensity, or 75 minutes (1 hour and 15 minutes) a week of vigorous-intensity aerobic physical activity, or an equivalent combination of moderate- and vigorous-intensity aerobic activity. Aerobic activity should be performed in episodes of at least 10 minutes, and preferably, it should be spread throughout the week.  Behavioral modification strategies: meal planning and cooking strategies and keeping healthy foods in the home.  Madison Palmer has agreed to follow-up with our clinic fasting in 2-3 weeks. She was informed of the importance of frequent follow-up visits to maximize her success with intensive lifestyle modifications for her multiple health conditions.   Objective:   Blood pressure (!) 136/84, pulse 74, temperature 98.2 F (36.8 C), temperature source Oral, height 5\' 2"  (1.575 m), weight (!) 302 lb (137 kg), last menstrual period 05/02/2017, SpO2 98 %. Body mass index is 55.24 kg/m.  General: Cooperative, alert, well developed, in no acute distress. HEENT: Conjunctivae and lids unremarkable. Cardiovascular: Regular rhythm.  Lungs: Normal work of breathing. Neurologic: No focal deficits.   Lab Results  Component Value Date   CREATININE 1.30 (H) 12/30/2018   BUN 23 12/30/2018   NA 144 12/30/2018   K 4.8 12/30/2018   CL 100 12/30/2018   CO2 30 (H) 12/30/2018   Lab Results  Component Value Date   ALT  16 12/30/2018   AST 13 12/30/2018   ALKPHOS 102 12/30/2018   BILITOT 0.7 12/30/2018   Lab Results  Component Value Date   HGBA1C 5.7 (H) 12/24/2018   HGBA1C 6.0 (H) 02/27/2018   HGBA1C 5.7 09/10/2017   HGBA1C 5.9 05/29/2017   Lab Results  Component Value Date   INSULIN 21.1 12/30/2018   INSULIN 9.6 02/27/2018   Lab Results  Component  Value Date   TSH 1.89 12/24/2018   Lab Results  Component Value Date   CHOL 255 (H) 12/30/2018   HDL 50 12/30/2018   LDLCALC 186 (H) 12/30/2018   TRIG 106 12/30/2018   CHOLHDL 5.6 (H) 12/24/2018   Lab Results  Component Value Date   WBC 5.4 09/25/2018   HGB 12.7 09/25/2018   HCT 40.4 09/25/2018   MCV 94.6 09/25/2018   PLT 229 09/25/2018   Lab Results  Component Value Date   IRON 44 02/20/2018   TIBC 347 05/29/2017   Attestation Statements:   Reviewed by clinician on day of visit: allergies, medications, problem list, medical history, surgical history, family history, social history, and previous encounter notes.  Time spent on visit including pre-visit chart review and post-visit charting and care was 32 minutes.   IMichaelene Song, am acting as transcriptionist for Abby Potash, PA-C   I have reviewed the above documentation for accuracy and completeness, and I agree with the above. Abby Potash, PA-C

## 2019-12-01 NOTE — Progress Notes (Signed)
Cardiology Office Note   Date:  12/02/2019   ID:  Madison Palmer, DOB Mar 15, 1966, MRN [id number]  PCP:  Flossie Buffy, NP  Cardiologist:   Minus Breeding, MD   Chief Complaint  Patient presents with  . Diastolic Heart Failure      History of Present Illness: Madison Palmer is a 54 y.o. female who presents for follow up of dyspnea.   She had an echo in Nov with a normal EF of 60 - 65% with severe concentric LVH.   She also is noted to have a past history of difficult to control hypertension.  She moved here from [place].  She has had LVH with preserved ejection fraction.  She had some mild internal carotid artery plaque noted on Doppler in the past.  She had a perfusion study in 2018 that demonstrated no evidence of ischemia or infarct.  She is been managed for her hypertension.   Since I last saw her she was in the hospital for management of her bipolar disorder, HTN and acute on chronic respiratory distress in part related to acute diastolic HF.    Since I last saw her she was in [place] because she was having some mental health issues and needed to be with family.  While she was there she had low blood pressures and had her clonidine reduced, amlodipine reduced and hydralazine stopped.  She has been feeling okay from a cardiac standpoint. The patient denies any new symptoms such as chest discomfort, neck or arm discomfort. There has been no new shortness of breath, PND or orthopnea. There have been no reported palpitations, presyncope or syncope.  She has not been particularly active because of joint pains. Her weight is back up.  She is working with [facility].     Past Medical History:  Diagnosis Date  . Anemia   . Anxiety   . Arrhythmia    tachycardia  . Arthritis   . Chickenpox   . Depression   . Diverticulitis   . GERD (gastroesophageal reflux disease)   . Glaucoma   . Hyperlipidemia   . Hyperparathyroidism ([place])   . Hypertension   .  Inflammatory polyps of colon ([place])   . LVH (left ventricular hypertrophy)   . Lymphedema   . PCOS (polycystic ovarian syndrome)   . Prediabetes   . Recurrent UTI   . Sleep apnea    CPAP  . Thyroid disease   . Vitamin D deficiency     Past Surgical History:  Procedure Laterality Date  . BREAST BIOPSY  2015  . CESAREAN SECTION  2004  . INNER EAR SURGERY     ear and sinus surgery  . LAPAROSCOPIC REPAIR AND REMOVAL OF GASTRIC BAND    . OOPHORECTOMY Left   . TONSILLECTOMY AND ADENOIDECTOMY       Current Outpatient Medications  Medication Sig Dispense Refill  . acetaminophen (TYLENOL) 500 MG tablet Take 1,000 mg by mouth 3 (three) times daily as needed for moderate pain or headache.    Marland Kitchen amLODipine (NORVASC) 5 MG tablet Take 5 mg by mouth daily.    . cetirizine (ZYRTEC ALLERGY) 10 MG tablet Take 1 tablet (10 mg total) by mouth daily. 90 tablet 1  . cloNIDine (CATAPRES) 0.1 MG tablet 2 (two) times daily.     . divalproex (DEPAKOTE) 250 MG DR tablet Take 1 tablet (250 mg total) by mouth 2 (two) times daily. 180 tablet 0  . esomeprazole (NEXIUM)  40 MG capsule TAKE ONE CAPSULE(40 MG TOTAL) BY MOUTH TWICE DAILY 180 capsule 2  . fenofibrate (TRICOR) 145 MG tablet Take 1 tablet (145 mg total) by mouth daily. 90 tablet 1  . fluticasone (FLONASE) 50 MCG/ACT nasal spray Place 2 sprays into both nostrils daily.    . furosemide (LASIX) 40 MG tablet Take 1 tablet (40 mg total) by mouth daily. 90 tablet 1  . labetalol (NORMODYNE) 300 MG tablet Take 2 tablets (600 mg total) by mouth 2 (two) times daily. 360 tablet 1  . levothyroxine (SYNTHROID) 75 MCG tablet Take 1 tablet (75 mcg total) by mouth daily before breakfast. Monday-Friday only 60 tablet 1  . lisinopril (ZESTRIL) 40 MG tablet Take 1 tablet (40 mg total) by mouth daily. 90 tablet 1  . Lurasidone HCl 60 MG TABS Take 1 tablet (60 mg total) by mouth daily after supper. 30 tablet 2  . Potassium Chloride ER 20 MEQ TBCR Take 2 tablets by mouth  daily. 180 tablet 0   No current facility-administered medications for this visit.    Allergies:   Other, Fentanyl, Levofloxacin, Midazolam, Pollen extract, Atorvastatin, Doxycycline, Hydralazine hcl, and Rosuvastatin    ROS:  Please see the history of present illness.   Otherwise, review of systems are positive for joint pain. .   All other systems are reviewed and negative.    PHYSICAL EXAM: VS:  BP 140/90   Pulse 68   Ht 5\' 3"  (1.6 m)   Wt (!) 308 lb (139.7 kg)   LMP 05/02/2017   SpO2 98%   BMI 54.56 kg/m  , BMI Body mass index is 54.56 kg/m. GENERAL:  Well appearing NECK:  No jugular venous distention, waveform within normal limits, carotid upstroke brisk and symmetric, no bruits, no thyromegaly LUNGS:  Clear to auscultation bilaterally CHEST:  Unremarkable HEART:  PMI not displaced or sustained,S1 and S2 within normal limits, no S3, no S4, no clicks, no rubs, no murmurs ABD:  Flat, positive bowel sounds normal in frequency in pitch, no bruits, no rebound, no guarding, no midline pulsatile mass, no hepatomegaly, no splenomegaly EXT:  2 plus pulses throughout, no edema, no cyanosis no clubbing   EKG:  EKG  ordered today. Sinus rhythm, rate 68, left axis deviation, poor anterior R wave progression, no acute ST changes.  Recent Labs: 12/24/2018: TSH 1.89 12/30/2018: ALT 16; BUN 23; Creatinine, Ser 1.30; Potassium 4.8; Sodium 144      Wt Readings from Last 3 Encounters:  12/02/19 (!) 308 lb (139.7 kg)  11/25/19 (!) 302 lb (137 kg)  05/29/19 297 lb (134.7 kg)      Other studies Reviewed: Additional studies/ records that were reviewed today include: Care Everywhere labs. Review of the above records demonstrates:  Please see elsewhere in the note.     ASSESSMENT AND PLAN:  HTN: Her blood pressure is upper limits and of asked her to keep a blood pressure check twice a week to make sure that I will have to go back up on some of her medicines.   PULMONARY NODULES:   She is going to call to schedule follow-up with pulmonary.  They have previously decided to swallow these without biopsy.   LVH:PYP scan was negative.    I suspect this is related to her hypertension which is currently controlled as above.  EDEMA:    This is much improved.  She has had some lymphedema.  No change in therapy.  Creatinine while she was in [place] was  stable.  CHRONICDIASTOLIC HF:   She seems to be euvolemic.  No change in therapy.  OBESITY:  She is continuing to work on this.   CORONARY CALCIUM:  She is not having any angina.  I am not planning an ischemia work-up.  SLEEP APNEA:   She wears CPAP.    CAROTID STENOSIS :   She had very mild plaque last year.  No change in therapy or further imaging at this time.  DYSLIPIDEMIA: I was able to review some labs done in March and her LDL was 133 which is much lower than previous.  This was drawn in [place].  I will plan to continue dietary changes.  She has been intolerant of statins.  She will continue TriCor.   Current medicines are reviewed at length with the patient today.  The patient does not have concerns regarding medicines.  The following changes have been made:  None  Labs/ tests ordered today include: None  Orders Placed This Encounter  Procedures  . EKG 12-Lead     Disposition:   FU with me in 12 months.    Signed, Minus Breeding, MD  12/02/2019 2:00 PM    [place]

## 2019-12-02 ENCOUNTER — Other Ambulatory Visit: Payer: Self-pay

## 2019-12-02 ENCOUNTER — Ambulatory Visit (INDEPENDENT_AMBULATORY_CARE_PROVIDER_SITE_OTHER): Payer: BC Managed Care – PPO | Admitting: Cardiology

## 2019-12-02 ENCOUNTER — Encounter: Payer: Self-pay | Admitting: Cardiology

## 2019-12-02 VITALS — BP 140/90 | HR 68 | Ht 63.0 in | Wt 308.0 lb

## 2019-12-02 DIAGNOSIS — I10 Essential (primary) hypertension: Secondary | ICD-10-CM

## 2019-12-02 DIAGNOSIS — I517 Cardiomegaly: Secondary | ICD-10-CM

## 2019-12-02 DIAGNOSIS — M7989 Other specified soft tissue disorders: Secondary | ICD-10-CM

## 2019-12-02 DIAGNOSIS — I5032 Chronic diastolic (congestive) heart failure: Secondary | ICD-10-CM | POA: Diagnosis not present

## 2019-12-02 DIAGNOSIS — G4730 Sleep apnea, unspecified: Secondary | ICD-10-CM

## 2019-12-02 DIAGNOSIS — R931 Abnormal findings on diagnostic imaging of heart and coronary circulation: Secondary | ICD-10-CM

## 2019-12-02 NOTE — Patient Instructions (Signed)
Medication Instructions:  CONTINUE WITH CURRENT MEDICATIONS. NO CHANGES.  *If you need a refill on your cardiac medications before your next appointment, please call your pharmacy*    Follow-Up: At [facility], you and your health needs are our priority.  As part of our continuing mission to provide you with exceptional heart care, we have created designated Provider Care Teams.  These Care Teams include your primary Cardiologist (physician) and Advanced Practice Providers (APPs -  Physician Assistants and Nurse Practitioners) who all work together to provide you with the care you need, when you need it.  We recommend signing up for the patient portal called "MyChart".  Sign up information is provided on this After Visit Summary.  MyChart is used to connect with patients for Virtual Visits (Telemedicine).  Patients are able to view lab/test results, encounter notes, upcoming appointments, etc.  Non-urgent messages can be sent to your provider as well.   To learn more about what you can do with MyChart, go to [email or web address].    Your next appointment:   12 month(s)  The format for your next appointment:   In Person  Provider:   James Hochrein, MD    

## 2019-12-08 ENCOUNTER — Other Ambulatory Visit: Payer: Self-pay | Admitting: Nurse Practitioner

## 2019-12-08 DIAGNOSIS — Z1231 Encounter for screening mammogram for malignant neoplasm of breast: Secondary | ICD-10-CM

## 2019-12-11 ENCOUNTER — Encounter (INDEPENDENT_AMBULATORY_CARE_PROVIDER_SITE_OTHER): Payer: Self-pay | Admitting: Family Medicine

## 2019-12-11 ENCOUNTER — Other Ambulatory Visit: Payer: Self-pay

## 2019-12-11 ENCOUNTER — Ambulatory Visit (INDEPENDENT_AMBULATORY_CARE_PROVIDER_SITE_OTHER): Payer: BC Managed Care – PPO | Admitting: Family Medicine

## 2019-12-11 VITALS — BP 150/86 | HR 68 | Temp 97.7°F | Ht 63.0 in | Wt 305.0 lb

## 2019-12-11 DIAGNOSIS — R7303 Prediabetes: Secondary | ICD-10-CM

## 2019-12-11 DIAGNOSIS — E038 Other specified hypothyroidism: Secondary | ICD-10-CM

## 2019-12-11 DIAGNOSIS — I10 Essential (primary) hypertension: Secondary | ICD-10-CM

## 2019-12-11 DIAGNOSIS — Z9189 Other specified personal risk factors, not elsewhere classified: Secondary | ICD-10-CM | POA: Diagnosis not present

## 2019-12-11 DIAGNOSIS — Z6841 Body Mass Index (BMI) 40.0 and over, adult: Secondary | ICD-10-CM

## 2019-12-12 LAB — COMPREHENSIVE METABOLIC PANEL
ALT: 13 IU/L (ref 0–32)
AST: 12 IU/L (ref 0–40)
Albumin/Globulin Ratio: 1.8 (ref 1.2–2.2)
Albumin: 4.4 g/dL (ref 3.8–4.9)
Alkaline Phosphatase: 92 IU/L (ref 48–121)
BUN/Creatinine Ratio: 15 (ref 9–23)
BUN: 17 mg/dL (ref 6–24)
Bilirubin Total: 0.5 mg/dL (ref 0.0–1.2)
CO2: 29 mmol/L (ref 20–29)
Calcium: 10.5 mg/dL — ABNORMAL HIGH (ref 8.7–10.2)
Chloride: 99 mmol/L (ref 96–106)
Creatinine, Ser: 1.16 mg/dL — ABNORMAL HIGH (ref 0.57–1.00)
GFR calc Af Amer: 62 mL/min/{1.73_m2} (ref >59)
GFR calc non Af Amer: 54 mL/min/{1.73_m2} — ABNORMAL LOW (ref >59)
Globulin, Total: 2.5 g/dL (ref 1.5–4.5)
Glucose: 99 mg/dL (ref 65–99)
Potassium: 4.4 mmol/L (ref 3.5–5.2)
Sodium: 141 mmol/L (ref 134–144)
Total Protein: 6.9 g/dL (ref 6.0–8.5)

## 2019-12-12 LAB — LIPID PANEL WITH LDL/HDL RATIO
Cholesterol, Total: 213 mg/dL — ABNORMAL HIGH (ref 100–199)
HDL: 48 mg/dL (ref >39)
LDL Chol Calc (NIH): 143 mg/dL — ABNORMAL HIGH (ref 0–99)
LDL/HDL Ratio: 3 ratio (ref 0.0–3.2)
Triglycerides: 122 mg/dL (ref 0–149)
VLDL Cholesterol Cal: 22 mg/dL (ref 5–40)

## 2019-12-12 LAB — T4: T4, Total: 9.7 ug/dL (ref 4.5–12.0)

## 2019-12-12 LAB — INSULIN, RANDOM: INSULIN: 11.4 u[IU]/mL (ref 2.6–24.9)

## 2019-12-12 LAB — TSH: TSH: 3.01 u[IU]/mL (ref 0.450–4.500)

## 2019-12-12 LAB — HEMOGLOBIN A1C
Est. average glucose Bld gHb Est-mCnc: 117 mg/dL
Hgb A1c MFr Bld: 5.7 % — ABNORMAL HIGH (ref 4.8–5.6)

## 2019-12-12 LAB — T3: T3, Total: 106 ng/dL (ref 71–180)

## 2019-12-15 NOTE — Progress Notes (Signed)
Chief Complaint:   OBESITY Madison Palmer is here to discuss her progress with her obesity treatment plan along with follow-up of her obesity related diagnoses. Madison Palmer is on the Category 2 Plan and states she is following her eating plan approximately 70% of the time. Madison Palmer states she is doing 0 minutes 0 times per week.  Today's visit was #: 21 Starting weight: 357 lbs Starting date: 02/27/2018 Today's weight: 305 lbs Today's date: 12/11/2019 Total lbs lost to date: 52 Total lbs lost since last in-office visit: 0  Interim History: Madison Palmer has gotten in the habit of laying in the bed a good amount of time, and she isn't getting all of the meals in. She is now living back in [place]. She finally brought groceries and she is ready to get back on track.  Subjective:   1. Essential hypertension Madison Palmer's blood pressure is slightly elevated today. She sees Dr. Percival Spanish. She denies chest pain, chest pressure, or headaches.  2. Pre-diabetes Madison Palmer's last A1c was 5.7, and she is not on metformin.  3. Other specified hypothyroidism Madison Palmer is on levothyroxine, and she denies cold/heat intolerance or palpitations.  4. At risk for diabetes mellitus Madison Palmer is at higher than average risk for developing diabetes due to her obesity.   Assessment/Plan:   1. Essential hypertension Madison Palmer is working on healthy weight loss and exercise to improve blood pressure control. We will watch for signs of hypotension as she continues her lifestyle modifications. We will check labs today. We will continue to monitor her blood pressure, no change in medications. If her blood pressure is elevated again then we will have her follow up with Dr. Percival Spanish.  - Comprehensive metabolic panel - Lipid Panel With LDL/HDL Ratio  2. Pre-diabetes Madison Palmer will continue to work on weight loss, exercise, and decreasing simple carbohydrates to help decrease the risk of diabetes. We will check labs today.  - Hemoglobin A1c -  Insulin, random  3. Other specified hypothyroidism Patient with long-standing hypothyroidism, on levothyroxine therapy. Madison Palmer appears euthyroid. We will check labs today. Orders and follow up as documented in patient record.  Counseling . Good thyroid control is important for overall health. Supratherapeutic thyroid levels are dangerous and will not improve weight loss results. . The correct way to take levothyroxine is fasting, with water, separated by at least 30 minutes from breakfast, and separated by more than 4 hours from calcium, iron, multivitamins, acid reflux medications (PPIs).   - T3 - T4 - TSH  4. At risk for diabetes mellitus Madison Palmer was given approximately 15 minutes of diabetes education and counseling today. We discussed intensive lifestyle modifications today with an emphasis on weight loss as well as increasing exercise and decreasing simple carbohydrates in her diet. We also reviewed medication options with an emphasis on risk versus benefit of those discussed.   Repetitive spaced learning was employed today to elicit superior memory formation and behavioral change.  5. Class 3 severe obesity with serious comorbidity and body mass index (BMI) of 50.0 to 59.9 in adult, unspecified obesity type ([facility]) Madison Palmer is currently in the action stage of change. As such, her goal is to continue with weight loss efforts. She has agreed to the Category 2 Plan.   Exercise goals: No exercise has been prescribed at this time.  Behavioral modification strategies: increasing lean protein intake, increasing vegetables, meal planning and cooking strategies and planning for success.  Madison Palmer has agreed to follow-up with our clinic in 2 weeks. She was informed  of the importance of frequent follow-up visits to maximize her success with intensive lifestyle modifications for her multiple health conditions.   Madison Palmer was informed we would discuss her lab results at her next visit unless there is a  critical issue that needs to be addressed sooner. Madison Palmer agreed to keep her next visit at the agreed upon time to discuss these results.  Objective:   Blood pressure (!) 150/86, pulse 68, temperature 97.7 F (36.5 C), temperature source Oral, height 5\' 3"  (1.6 m), weight (!) 305 lb (138.3 kg), last menstrual period 05/02/2017, SpO2 98 %. Body mass index is 54.03 kg/m.  General: Cooperative, alert, well developed, in no acute distress. HEENT: Conjunctivae and lids unremarkable. Cardiovascular: Regular rhythm.  Lungs: Normal work of breathing. Neurologic: No focal deficits.   Lab Results  Component Value Date   CREATININE 1.16 (H) 12/11/2019   BUN 17 12/11/2019   NA 141 12/11/2019   K 4.4 12/11/2019   CL 99 12/11/2019   CO2 29 12/11/2019   Lab Results  Component Value Date   ALT 13 12/11/2019   AST 12 12/11/2019   ALKPHOS 92 12/11/2019   BILITOT 0.5 12/11/2019   Lab Results  Component Value Date   HGBA1C 5.7 (H) 12/11/2019   HGBA1C 5.7 (H) 12/24/2018   HGBA1C 6.0 (H) 02/27/2018   HGBA1C 5.7 09/10/2017   HGBA1C 5.9 05/29/2017   Lab Results  Component Value Date   INSULIN 11.4 12/11/2019   INSULIN 21.1 12/30/2018   INSULIN 9.6 02/27/2018   Lab Results  Component Value Date   TSH 3.010 12/11/2019   Lab Results  Component Value Date   CHOL 213 (H) 12/11/2019   HDL 48 12/11/2019   LDLCALC 143 (H) 12/11/2019   TRIG 122 12/11/2019   CHOLHDL 5.6 (H) 12/24/2018   Lab Results  Component Value Date   WBC 5.4 09/25/2018   HGB 12.7 09/25/2018   HCT 40.4 09/25/2018   MCV 94.6 09/25/2018   PLT 229 09/25/2018   Lab Results  Component Value Date   IRON 44 02/20/2018   TIBC 347 05/29/2017   Attestation Statements:   Reviewed by clinician on day of visit: allergies, medications, problem list, medical history, surgical history, family history, social history, and previous encounter notes.   I, Trixie Dredge, am acting as transcriptionist for Coralie Common,  MD.  I have reviewed the above documentation for accuracy and completeness, and I agree with the above. - Jinny Blossom, MD

## 2019-12-22 ENCOUNTER — Other Ambulatory Visit: Payer: Self-pay

## 2019-12-22 ENCOUNTER — Ambulatory Visit
Admission: RE | Admit: 2019-12-22 | Discharge: 2019-12-22 | Disposition: A | Discharge status: Home / Self Care | Payer: BC Managed Care – PPO | Source: Ambulatory Visit | Attending: Nurse Practitioner | Admitting: Nurse Practitioner

## 2019-12-22 DIAGNOSIS — Z1231 Encounter for screening mammogram for malignant neoplasm of breast: Secondary | ICD-10-CM

## 2019-12-25 ENCOUNTER — Ambulatory Visit (INDEPENDENT_AMBULATORY_CARE_PROVIDER_SITE_OTHER): Payer: BC Managed Care – PPO | Admitting: Family Medicine

## 2019-12-25 ENCOUNTER — Encounter (INDEPENDENT_AMBULATORY_CARE_PROVIDER_SITE_OTHER): Payer: Self-pay | Admitting: Family Medicine

## 2019-12-25 ENCOUNTER — Other Ambulatory Visit: Payer: Self-pay

## 2019-12-25 VITALS — BP 135/84 | HR 84 | Temp 98.4°F | Ht 63.0 in | Wt 308.0 lb

## 2019-12-25 DIAGNOSIS — E213 Hyperparathyroidism, unspecified: Secondary | ICD-10-CM

## 2019-12-25 DIAGNOSIS — E7849 Other hyperlipidemia: Secondary | ICD-10-CM

## 2019-12-25 DIAGNOSIS — R7303 Prediabetes: Secondary | ICD-10-CM | POA: Diagnosis not present

## 2019-12-25 DIAGNOSIS — Z6841 Body Mass Index (BMI) 40.0 and over, adult: Secondary | ICD-10-CM

## 2019-12-25 DIAGNOSIS — Z9189 Other specified personal risk factors, not elsewhere classified: Secondary | ICD-10-CM

## 2019-12-25 MED ORDER — METFORMIN HCL 500 MG PO TABS: 500.0000 mg | ORAL_TABLET | Freq: Every day | ORAL | 0 refills | Status: DC

## 2019-12-25 NOTE — Progress Notes (Signed)
Chief Complaint:   OBESITY Madison Palmer is here to discuss her progress with her obesity treatment plan along with follow-up of her obesity related diagnoses. Madison Palmer is on the Category 2 Plan and states she is following her eating plan approximately 30% of the time. Madison Palmer states she is doing 0 minutes 0 times per week.  Today's visit was #: 22 Starting weight: 357 lbs Starting date: 02/27/2018 Today's weight: 308 lbs Today's date: 12/25/2019 Total lbs lost to date: 49 Total lbs lost since last in-office visit: 0  Interim History: Madison Palmer voices breakfast is often eating out from [employer] or [employer], but lunch is hit or miss. 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 She does reports she is eating out more as her son is oftne eating what she cooks, which is leading her to not have leftovers.  Subjective:   1. Pre-diabetes Madison Palmer's last A1c was 5.7 and insulin 11.4. She is not on metformin. She notes increased carbohydrate cravings.  2. Hyperparathyroidism (Madison Palmer) Madison Palmer has a calcium of 10.5, but she can not recall the name of her Endocrinologist in [facility] system.  3. Other hyperlipidemia Madison Palmer's last LDl was 143, HDL 48, and triglycerides 122. She is on Fenofibrate.  4. At risk for diabetes mellitus Madison Palmer is at higher than average risk for developing diabetes due to her obesity.   Assessment/Plan:   1. Pre-diabetes Madison Palmer will continue to work on weight loss, exercise, and decreasing simple carbohydrates to help decrease the risk of diabetes. We will repeat labs in 3 months. No change in Category 2 plan, and we will refill metformin for 1 month.  - metFORMIN (GLUCOPHAGE) 500 MG tablet; Take 1 tablet (500 mg total) by mouth daily with breakfast.  Dispense: 30 tablet; Refill: 0  2. Hyperparathyroidism (Madison Palmer) Madison Palmer is to reach out to Endocrinology for further management.  3. Other hyperlipidemia Madison Palmer risk and specific lipid/LDL goals reviewed. We discussed several  lifestyle modifications today and Madison Palmer will continue to work on diet, exercise and weight loss efforts. We will repeat labs in early 2022. Orders and follow up as documented in patient record.   Counseling Intensive lifestyle modifications are the first line treatment for this issue. . Dietary changes: Increase soluble fiber. Decrease simple carbohydrates. . Exercise changes: Moderate to vigorous-intensity aerobic activity 150 minutes per week if tolerated. . Lipid-lowering medications: see documented in medical record.  4. At risk for diabetes mellitus Madison Palmer was given approximately 15 minutes of diabetes education and counseling today. We discussed intensive lifestyle modifications today with an emphasis on weight loss as well as increasing exercise and decreasing simple carbohydrates in her diet. We also reviewed medication options with an emphasis on risk versus benefit of those discussed.   Repetitive spaced learning was employed today to elicit superior memory formation and behavioral change.  5. Class 3 severe obesity with serious comorbidity and body mass index (BMI) of 50.0 to 59.9 in adult, unspecified obesity type (HCC) Madison Palmer is currently in the action stage of change. As such, her goal is to continue with weight loss efforts. She has agreed to the Category 2 Plan.   Exercise goals: All adults should avoid inactivity. Some physical activity is better than none, and adults who participate in any amount of physical activity gain some health benefits.  Behavioral modification strategies: increasing lean protein intake, increasing vegetables, meal planning and cooking strategies, keeping healthy foods in the home and planning for success.  Madison Palmer has agreed to follow-up with our  clinic in 2 weeks. She was informed of the importance of frequent follow-up visits to maximize her success with intensive lifestyle modifications for her multiple health conditions.   Objective:   Blood  pressure 135/84, pulse 84, temperature 98.4 F (36.9 C), temperature source Oral, height 5\' 3"  (1.6 m), weight (!) 308 lb (139.7 kg), last menstrual period 05/02/2017, SpO2 96 %. Body mass index is 54.56 kg/m.  General: Cooperative, alert, well developed, in no acute distress. HEENT: Conjunctivae and lids unremarkable. Madison Palmer: Regular rhythm.  Lungs: Normal work of breathing. Neurologic: No focal deficits.   Lab Results  Component Value Date   CREATININE 1.16 (H) 12/11/2019   BUN 17 12/11/2019   NA 141 12/11/2019   K 4.4 12/11/2019   CL 99 12/11/2019   CO2 29 12/11/2019   Lab Results  Component Value Date   ALT 13 12/11/2019   AST 12 12/11/2019   ALKPHOS 92 12/11/2019   BILITOT 0.5 12/11/2019   Lab Results  Component Value Date   HGBA1C 5.7 (H) 12/11/2019   HGBA1C 5.7 (H) 12/24/2018   HGBA1C 6.0 (H) 02/27/2018   HGBA1C 5.7 09/10/2017   HGBA1C 5.9 05/29/2017   Lab Results  Component Value Date   INSULIN 11.4 12/11/2019   INSULIN 21.1 12/30/2018   INSULIN 9.6 02/27/2018   Lab Results  Component Value Date   TSH 3.010 12/11/2019   Lab Results  Component Value Date   CHOL 213 (H) 12/11/2019   HDL 48 12/11/2019   LDLCALC 143 (H) 12/11/2019   TRIG 122 12/11/2019   CHOLHDL 5.6 (H) 12/24/2018   Lab Results  Component Value Date   WBC 5.4 09/25/2018   HGB 12.7 09/25/2018   HCT 40.4 09/25/2018   MCV 94.6 09/25/2018   PLT 229 09/25/2018   Lab Results  Component Value Date   IRON 44 02/20/2018   TIBC 347 05/29/2017   Attestation Statements:   Reviewed by clinician on day of visit: allergies, medications, problem list, medical history, surgical history, family history, social history, and previous encounter notes.   I, Trixie Dredge, am acting as transcriptionist for Coralie Common, MD.  I have reviewed the above documentation for accuracy and completeness, and I agree with the above. - Jinny Blossom, MD

## 2020-01-14 ENCOUNTER — Encounter (INDEPENDENT_AMBULATORY_CARE_PROVIDER_SITE_OTHER): Payer: Self-pay | Admitting: Family Medicine

## 2020-01-14 ENCOUNTER — Ambulatory Visit (INDEPENDENT_AMBULATORY_CARE_PROVIDER_SITE_OTHER): Payer: BC Managed Care – PPO | Admitting: Family Medicine

## 2020-01-14 ENCOUNTER — Other Ambulatory Visit: Payer: Self-pay

## 2020-01-14 VITALS — BP 145/85 | HR 77 | Temp 98.3°F | Ht 63.0 in | Wt 311.0 lb

## 2020-01-14 DIAGNOSIS — R7303 Prediabetes: Secondary | ICD-10-CM

## 2020-01-14 DIAGNOSIS — I10 Essential (primary) hypertension: Secondary | ICD-10-CM

## 2020-01-14 DIAGNOSIS — Z6841 Body Mass Index (BMI) 40.0 and over, adult: Secondary | ICD-10-CM | POA: Diagnosis not present

## 2020-01-15 NOTE — Progress Notes (Signed)
Chief Complaint:   OBESITY Madison Palmer is here to discuss her progress with her obesity treatment plan along with follow-up of her obesity related diagnoses. Madison Palmer is on the Category 2 Plan and states she is following her eating plan approximately 50% of the time. Madison Palmer states she is exercising with a personal trainer for 30 minutes 1-2 times per week.  Today's visit was #: 23 Starting weight: 357 lbs Starting date: 02/27/2018 Today's weight: 311 lbs Today's date: 01/14/2020 Total lbs lost to date: 46 Total lbs lost since last in-office visit: 0  Interim History: Madison Palmer voices that she has been eating out more frequently than before, and making small changes in choices that she is making when eating out. She is currently eating out everyday for breakfast, skipping lunch, and then doing quite a bit of salad.  Subjective:   1. Pre-diabetes Madison Palmer's last A1c was 5.7 and insulin 11.4. Her A1c is not yet at goal. She is on metformin and denies GI side effects. She is on a few obesity promoting medications.   2. Essential hypertension Madison Palmer's blood pressure is slightly elevated today. Her blood pressure was previously within normal limits. She sees Cardiology.  Assessment/Plan:   1. Pre-diabetes Madison Palmer will continue metformin, no refill needed, and will continue to work on weight loss, exercise, and decreasing simple carbohydrates to help decrease the risk of diabetes.   2. Essential hypertension Madison Palmer is working on healthy weight loss and exercise to improve blood pressure control. We will watch for signs of hypotension as she continues her lifestyle modifications. We will follow up on her blood pressure at her next appointment, no change in medications.  3. Class 3 severe obesity with serious comorbidity and body mass index (BMI) of 50.0 to 59.9 in adult, unspecified obesity type (HCC) Madison Palmer is currently in the action stage of change. As such, her goal is to continue with weight  loss efforts. She has agreed to the Category 2 Plan.   Madison Palmer will decrease breakfast eating out to 4 times per week, stop skipping lunch and eat something, and supplement the protein at dinner with any of the protein equivalents.  Exercise goals: All adults should avoid inactivity. Some physical activity is better than none, and adults who participate in any amount of physical activity gain some health benefits.  Behavioral modification strategies: increasing lean protein intake, no skipping meals, meal planning and cooking strategies, keeping healthy foods in the home and planning for success.  Madison Palmer has agreed to follow-up with our clinic in 2 weeks. She was informed of the importance of frequent follow-up visits to maximize her success with intensive lifestyle modifications for her multiple health conditions.   Objective:   Blood pressure (!) 145/85, pulse 77, temperature 98.3 F (36.8 C), temperature source Oral, height 5\' 3"  (1.6 m), weight (!) 311 lb (141.1 kg), last menstrual period 05/02/2017, SpO2 99 %. Body mass index is 55.09 kg/m.  General: Cooperative, alert, well developed, in no acute distress. HEENT: Conjunctivae and lids unremarkable. Cardiovascular: Regular rhythm.  Lungs: Normal work of breathing. Neurologic: No focal deficits.   Lab Results  Component Value Date   CREATININE 1.16 (H) 12/11/2019   BUN 17 12/11/2019   NA 141 12/11/2019   K 4.4 12/11/2019   CL 99 12/11/2019   CO2 29 12/11/2019   Lab Results  Component Value Date   ALT 13 12/11/2019   AST 12 12/11/2019   ALKPHOS 92 12/11/2019   BILITOT 0.5 12/11/2019  Lab Results  Component Value Date   HGBA1C 5.7 (H) 12/11/2019   HGBA1C 5.7 (H) 12/24/2018   HGBA1C 6.0 (H) 02/27/2018   HGBA1C 5.7 09/10/2017   HGBA1C 5.9 05/29/2017   Lab Results  Component Value Date   INSULIN 11.4 12/11/2019   INSULIN 21.1 12/30/2018   INSULIN 9.6 02/27/2018   Lab Results  Component Value Date   TSH 3.010  12/11/2019   Lab Results  Component Value Date   CHOL 213 (H) 12/11/2019   HDL 48 12/11/2019   LDLCALC 143 (H) 12/11/2019   TRIG 122 12/11/2019   CHOLHDL 5.6 (H) 12/24/2018   Lab Results  Component Value Date   WBC 5.4 09/25/2018   HGB 12.7 09/25/2018   HCT 40.4 09/25/2018   MCV 94.6 09/25/2018   PLT 229 09/25/2018   Lab Results  Component Value Date   IRON 44 02/20/2018   TIBC 347 05/29/2017   Attestation Statements:   Reviewed by clinician on day of visit: allergies, medications, problem list, medical history, surgical history, family history, social history, and previous encounter notes.  Time spent on visit including pre-visit chart review and post-visit care and charting was 15 minutes.    I, Trixie Dredge, am acting as transcriptionist for Coralie Common, MD.  I have reviewed the above documentation for accuracy and completeness, and I agree with the above. - Jinny Blossom, MD

## 2020-01-19 ENCOUNTER — Encounter (HOSPITAL_COMMUNITY): Payer: Self-pay | Admitting: Psychiatry

## 2020-01-19 ENCOUNTER — Other Ambulatory Visit: Payer: Self-pay

## 2020-01-19 ENCOUNTER — Telehealth (INDEPENDENT_AMBULATORY_CARE_PROVIDER_SITE_OTHER): Payer: BC Managed Care – PPO | Admitting: Psychiatry

## 2020-01-19 DIAGNOSIS — F319 Bipolar disorder, unspecified: Secondary | ICD-10-CM

## 2020-01-19 DIAGNOSIS — F419 Anxiety disorder, unspecified: Secondary | ICD-10-CM

## 2020-01-19 MED ORDER — DIVALPROEX SODIUM 250 MG PO DR TAB: 250.0000 mg | DELAYED_RELEASE_TABLET | Freq: Two times a day (BID) | ORAL | 0 refills | Status: DC

## 2020-01-19 MED ORDER — LURASIDONE HCL 60 MG PO TABS: 60.0000 mg | ORAL_TABLET | Freq: Every morning | ORAL | 2 refills | Status: DC

## 2020-01-19 NOTE — Progress Notes (Signed)
Virtual Visit via Telephone Note  I connected with Madison Palmer on 01/19/20 at 10:00 AM EDT by telephone and verified that I am speaking with the correct person using two identifiers.  Location: Patient: home Provider: home office   I discussed the limitations, risks, security and privacy concerns of performing an evaluation and management service by telephone and the availability of in person appointments. I also discussed with the patient that there may be a patient responsible charge related to this service. The patient expressed understanding and agreed to proceed.   History of Present Illness: Patient is evaluated by phone session.  She moved back to [place] in first week of July.  She is living with her son who started school.  She is doing okay but sometimes she admitted sleeping too much.  When she dropped her son to school after that she feels that she need to take a nap.  She also weight gain and now working with healthy weight management.  Her physician put her back on metformin.  She denies any highs and lows, mania, anger, crying spells or any feeling of hopelessness.  She feels her current medicine is helping her anxiety and bipolar symptoms.  She has no tremors, shakes or any EPS.  She did not get financial support from her ex-husband but she is in touch with him on a regular basis.  She is taking Depakote 250 mg twice a day and Latuda 60 mg at bedtime.  Recently she had blood work.  She denies drinking or using any illegal substances.   Past psychiatric history; H/Odepression since early age. [facility] delusion, psychosis and violent behavior. D/C on Depakote 500 mg BID, Latuda 60 mg daily and Trilafon 12 mg a day. No h/osuicidalattempt,nightmares, panic attack or OCD symptoms.No h/odrinking and drug use.   Recent Results (from the past 2160 hour(s))  Hemoglobin A1c     Status: Abnormal   Collection Time:  12/11/19  3:10 PM  Result Value Ref Range   Hgb A1c MFr Bld 5.7 (H) 4.8 - 5.6 %    Comment:          Prediabetes: 5.7 - 6.4          Diabetes: >6.4          Glycemic control for adults with diabetes: <7.0    Est. average glucose Bld gHb Est-mCnc 117 mg/dL  Insulin, random     Status: None   Collection Time: 12/11/19  3:10 PM  Result Value Ref Range   INSULIN 11.4 2.6 - 24.9 uIU/mL  T3     Status: None   Collection Time: 12/11/19  3:10 PM  Result Value Ref Range   T3, Total 106 71 - 180 ng/dL  T4     Status: None   Collection Time: 12/11/19  3:10 PM  Result Value Ref Range   T4, Total 9.7 4.5 - 12.0 ug/dL  TSH     Status: None   Collection Time: 12/11/19  3:10 PM  Result Value Ref Range   TSH 3.010 0.450 - 4.500 uIU/mL  Comprehensive metabolic panel     Status: Abnormal   Collection Time: 12/11/19  3:10 PM  Result Value Ref Range   Glucose 99 65 - 99 mg/dL   BUN 17 6 - 24 mg/dL   Creatinine, Ser 1.16 (H) 0.57 - 1.00 mg/dL   GFR calc non Af Amer 54 (L) >59 mL/min/1.73   GFR calc  Af Amer 62 >59 mL/min/1.73    Comment: **[facility] currently reports eGFR in compliance with the current**   recommendations of the [employer]. [facility] will   update reporting as new guidelines are published from the NKF-ASN   Task force.    BUN/Creatinine Ratio 15 9 - 23   Sodium 141 134 - 144 mmol/L   Potassium 4.4 3.5 - 5.2 mmol/L   Chloride 99 96 - 106 mmol/L   CO2 29 20 - 29 mmol/L   Calcium 10.5 (H) 8.7 - 10.2 mg/dL   Total Protein 6.9 6.0 - 8.5 g/dL   Albumin 4.4 3.8 - 4.9 g/dL   Globulin, Total 2.5 1.5 - 4.5 g/dL   Albumin/Globulin Ratio 1.8 1.2 - 2.2   Bilirubin Total 0.5 0.0 - 1.2 mg/dL   Alkaline Phosphatase 92 48 - 121 IU/L   AST 12 0 - 40 IU/L   ALT 13 0 - 32 IU/L  Lipid Panel With LDL/HDL Ratio     Status: Abnormal   Collection Time: 12/11/19  3:10 PM  Result Value Ref Range   Cholesterol, Total 213 (H) 100 - 199 mg/dL   Triglycerides 122 0 - 149 mg/dL   HDL 48  >39 mg/dL   VLDL Cholesterol Cal 22 5 - 40 mg/dL   LDL Chol Calc (NIH) 143 (H) 0 - 99 mg/dL   LDL/HDL Ratio 3.0 0.0 - 3.2 ratio    Comment:                                     LDL/HDL Ratio                                             Men  Women                               1/2 Avg.Risk  1.0    1.5                                   Avg.Risk  3.6    3.2                                2X Avg.Risk  6.2    5.0                                3X Avg.Risk  8.0    6.1      Psychiatric Specialty Exam: Physical Exam  Review of Systems  Weight (!) 311 lb (141.1 kg), last menstrual period 05/02/2017.There is no height or weight on file to calculate BMI.  General Appearance: NA  Eye Contact:  NA  Speech:  Slow  Volume:  Normal  Mood:  Euthymic  Affect:  NA  Thought Process:  Goal Directed  Orientation:  Full (Time, Place, and Person)  Thought Content:  WDL  Suicidal Thoughts:  No  Homicidal Thoughts:  No  Memory:  Immediate;   Good Recent;   Good Remote;   Good  Judgement:  Intact  Insight:  Present  Psychomotor Activity:  NA  Concentration:  Concentration: Fair and Attention Span: Fair  Recall:  Good  Fund of Knowledge:  Good  Language:  Good  Akathisia:  No  Handed:  Right  AIMS (if indicated):     Assets:  Communication Skills Desire for Improvement Housing Resilience  ADL's:  Intact  Cognition:  WNL  Sleep:   10-11 hrs      Assessment and Plan: Bipolar disorder type I.  Anxiety.  Reviewed blood work results and her current medication.  She endorses sleeping some days up to 11 hours.  We do not have Depakote level and recent blood work was done by PCP and did not include Depakote level.  We will request her physician to do Depakote level since patient has upcoming appointment in 1 week.  I also recommend to try taking Latuda 60 mg in the morning since she is taking Depakote and it daily at bedtime that could be contributing hypersomnia.  She agreed to give a try.  I  recommend to call us back if she has any question, concern or if she feels worsening of the symptoms.  Encourage exercise, walking and watch her caloric intake.  Follow-up in 3 months.  I will forward my note to her physician for Depakote level.  Follow Up Instructions:    I discussed the assessment and treatment plan with the patient. The patient was provided an opportunity to ask questions and all were answered. The patient agreed with the plan and demonstrated an understanding of the instructions.   The patient was advised to call back or seek an in-person evaluation if the symptoms worsen or if the condition fails to improve as anticipated.  I provided 15 minutes of non-face-to-face time during this encounter.   Kathlee Nations, MD

## 2020-01-21 ENCOUNTER — Ambulatory Visit: Payer: BC Managed Care – PPO | Admitting: Pulmonary Disease

## 2020-01-28 ENCOUNTER — Ambulatory Visit (INDEPENDENT_AMBULATORY_CARE_PROVIDER_SITE_OTHER): Payer: BC Managed Care – PPO | Admitting: Family Medicine

## 2020-02-03 ENCOUNTER — Ambulatory Visit (INDEPENDENT_AMBULATORY_CARE_PROVIDER_SITE_OTHER): Payer: BC Managed Care – PPO | Admitting: Family Medicine

## 2020-02-03 ENCOUNTER — Other Ambulatory Visit: Payer: Self-pay

## 2020-02-03 ENCOUNTER — Encounter (INDEPENDENT_AMBULATORY_CARE_PROVIDER_SITE_OTHER): Payer: Self-pay | Admitting: Family Medicine

## 2020-02-03 VITALS — BP 139/85 | HR 73 | Temp 98.3°F | Ht 63.0 in | Wt 308.0 lb

## 2020-02-03 DIAGNOSIS — Z6841 Body Mass Index (BMI) 40.0 and over, adult: Secondary | ICD-10-CM

## 2020-02-03 DIAGNOSIS — I10 Essential (primary) hypertension: Secondary | ICD-10-CM | POA: Diagnosis not present

## 2020-02-03 DIAGNOSIS — F312 Bipolar disorder, current episode manic severe with psychotic features: Secondary | ICD-10-CM

## 2020-02-03 DIAGNOSIS — Z9189 Other specified personal risk factors, not elsewhere classified: Secondary | ICD-10-CM

## 2020-02-04 LAB — VALPROIC ACID LEVEL: Valproic Acid Lvl: 23 ug/mL — ABNORMAL LOW (ref 50–100)

## 2020-02-04 NOTE — Progress Notes (Signed)
Chief Complaint:   OBESITY Madison Palmer is here to discuss her progress with her obesity treatment plan along with follow-up of her obesity related diagnoses. Madison Palmer is on the Category 2 Plan and states she is following her eating plan approximately 40% of the time. Madison Palmer states she is exercising with a trainer for 30 minutes 2 times per week.  Today's visit was #: 24 Starting weight: 257 lbs Starting date: 02/27/2018 Today's weight: 308 lbs Today's date: 02/03/2020 Total lbs lost to date: 49 Total lbs lost since last in-office visit: 3  Interim History: Madison Palmer voices that she has still been eating out quite a bit but has refocused to make more nutritious choices. She is choosing egg Mcmuffin instead of combo. Lunch was mainly grilled nuggets and salad or nuggets and fruit. She is choosing meat and veggies more instead of carbs.  Subjective:   1. Essential hypertension Madison Palmer's blood pressure is better controlled today, but often labile. She denies chest pain, chest pressure, or headache.  2. Bipolar I disorder, current or most recent episode manic, with psychotic features [facility]) Madison Palmer sees Dr. Adele Schilder, MD. She is on Latuda and Depakote.  3. At risk for heart disease Madison Palmer is at a higher than average risk for cardiovascular disease due to obesity.   Assessment/Plan:   1. Essential hypertension Madison Palmer is working on healthy weight loss and exercise to improve blood pressure control. We will watch for signs of hypotension as she continues her lifestyle modifications. Madison Palmer will continue her current medications, and we will follow up on her blood pressure at her next appointment.  2. Bipolar I disorder, current or most recent episode manic, with psychotic features ([place]) We will check valproic acid level today, and Madison Palmer will continue to follow up as directed.  - Valproic Acid level - Valproic Acid level  3. At risk for heart disease Madison Palmer was given approximately 15 minutes of  coronary artery disease prevention counseling today. She is 54 y.o. female and has risk factors for heart disease including obesity. We discussed intensive lifestyle modifications today with an emphasis on specific weight loss instructions and strategies.   Repetitive spaced learning was employed today to elicit superior memory formation and behavioral change.  4. Class 3 severe obesity with serious comorbidity and body mass index (BMI) of 50.0 to 59.9 in adult, unspecified obesity type (Madison Palmer) Madison Palmer is currently in the action stage of change. As such, her goal is to continue with weight loss efforts. She has agreed to the Category 2 Plan or keeping a food journal and adhering to recommended goals of 1200-1300 calories and 85+ grams of protein daily.   Exercise goals: As is.  Behavioral modification strategies: increasing lean protein intake, keeping healthy foods in the home and ways to avoid boredom eating.  Syona has agreed to follow-up with our clinic in 2 to 3 weeks. She was informed of the importance of frequent follow-up visits to maximize her success with intensive lifestyle modifications for her multiple health conditions.   Objective:   Blood pressure 139/85, pulse 73, temperature 98.3 F (36.8 C), height 5\' 3"  (1.6 m), weight (!) 308 lb (139.7 kg), last menstrual period 05/02/2017, SpO2 98 %. Body mass index is 54.56 kg/m.  General: Cooperative, alert, well developed, in no acute distress. HEENT: Conjunctivae and lids unremarkable. Cardiovascular: Regular rhythm.  Lungs: Normal work of breathing. Neurologic: No focal deficits.   Lab Results  Component Value Date   CREATININE 1.16 (H) 12/11/2019   BUN  17 12/11/2019   NA 141 12/11/2019   K 4.4 12/11/2019   CL 99 12/11/2019   CO2 29 12/11/2019   Lab Results  Component Value Date   ALT 13 12/11/2019   AST 12 12/11/2019   ALKPHOS 92 12/11/2019   BILITOT 0.5 12/11/2019   Lab Results  Component Value Date   HGBA1C 5.7  (H) 12/11/2019   HGBA1C 5.7 (H) 12/24/2018   HGBA1C 6.0 (H) 02/27/2018   HGBA1C 5.7 09/10/2017   HGBA1C 5.9 05/29/2017   Lab Results  Component Value Date   INSULIN 11.4 12/11/2019   INSULIN 21.1 12/30/2018   INSULIN 9.6 02/27/2018   Lab Results  Component Value Date   TSH 3.010 12/11/2019   Lab Results  Component Value Date   CHOL 213 (H) 12/11/2019   HDL 48 12/11/2019   LDLCALC 143 (H) 12/11/2019   TRIG 122 12/11/2019   CHOLHDL 5.6 (H) 12/24/2018   Lab Results  Component Value Date   WBC 5.4 09/25/2018   HGB 12.7 09/25/2018   HCT 40.4 09/25/2018   MCV 94.6 09/25/2018   PLT 229 09/25/2018   Lab Results  Component Value Date   IRON 44 02/20/2018   TIBC 347 05/29/2017   Attestation Statements:   Reviewed by clinician on day of visit: allergies, medications, problem list, medical history, surgical history, family history, social history, and previous encounter notes.   I, Trixie Dredge, am acting as transcriptionist for Coralie Common, MD. I have reviewed the above documentation for accuracy and completeness, and I agree with the above. - Jinny Blossom, MD

## 2020-02-09 NOTE — Progress Notes (Signed)
Lolly's depakote level

## 2020-02-10 ENCOUNTER — Other Ambulatory Visit: Payer: Self-pay | Admitting: Cardiology

## 2020-02-11 ENCOUNTER — Encounter: Payer: Self-pay | Admitting: Pulmonary Disease

## 2020-02-11 ENCOUNTER — Ambulatory Visit (INDEPENDENT_AMBULATORY_CARE_PROVIDER_SITE_OTHER): Payer: BC Managed Care – PPO | Admitting: Pulmonary Disease

## 2020-02-11 ENCOUNTER — Other Ambulatory Visit: Payer: Self-pay

## 2020-02-11 DIAGNOSIS — G4733 Obstructive sleep apnea (adult) (pediatric): Secondary | ICD-10-CM | POA: Diagnosis not present

## 2020-02-11 DIAGNOSIS — J9611 Chronic respiratory failure with hypoxia: Secondary | ICD-10-CM | POA: Diagnosis not present

## 2020-02-11 DIAGNOSIS — Z23 Encounter for immunization: Secondary | ICD-10-CM

## 2020-02-11 DIAGNOSIS — Z9989 Dependence on other enabling machines and devices: Secondary | ICD-10-CM

## 2020-02-11 DIAGNOSIS — I5032 Chronic diastolic (congestive) heart failure: Secondary | ICD-10-CM

## 2020-02-11 DIAGNOSIS — R918 Other nonspecific abnormal finding of lung field: Secondary | ICD-10-CM

## 2020-02-11 NOTE — Patient Instructions (Signed)
Flu shot Schedule CT chest without contrast -1 year follow-up for pulmonary nodules. Schedule PFTs.  Use oxygen as needed Monitor your weight twice a week, if weight goes up by 3 pounds take an extra dose of Lasix

## 2020-02-11 NOTE — Assessment & Plan Note (Signed)
CPAP download shows excellent compliance on 9 cm, good control of events, minimal leak CPAP is certainly helped improve her daytime somnolence and fatigue  Weight loss encouraged, compliance with goal of at least 4-6 hrs every night is the expectation. Advised against medications with sedative side effects Cautioned against driving when sleepy - understanding that sleepiness will vary on a day to day basis

## 2020-02-11 NOTE — Telephone Encounter (Signed)
Rx request sent to pharmacy.  

## 2020-02-11 NOTE — Progress Notes (Signed)
   Subjective:    Patient ID: Madison Palmer, female    DOB: 08/16/1965, 54 y.o.   MRN: [id number]  HPI   55 yomorbidly obese womanwith chronic lymphedemafor FU ofsevereobstructive sleep apnea & pulm nodules.Noted 05/2018   She underwent lap band in 2009 and lost weight and CPAP was then decreased to 9 cm in 06/2009.lap band was removed in 2013. PMH - laryngospasm with prior surgery   She is very compliant with CPAP and denies problems with mask or pressure. Weight is dropped from 3 35-31 2 pounds. She had a nervous breakdown and moved back to [place] to be with her family but has now come back to town  During physical therapy at home she found that her saturation drops in the 80s and recovers quickly.  She stopped using nocturnal oxygen 10/2019 Compliant with Lasix daily  12/2018 ONO showed SpO2 low 85%, baseline 91%. She spent 8 mins <88% >> advised to continue using oxygen blended into CPAP   Significant tests/ events reviewed  CT angiogram1/10/20was negative for pulmonary emboli, but showed numerous pulmonary nodules largest 15 mm in the right lower lobe and 13 mm in the lingula. Mosaic attenuation was also noted suggesting air trapping   PET 05/24/18  low-grade hypermetabolism SUV 3-4 range in the nodules. Mediastinum did not show any hypermetabolic   prior CT chest from 2010 and 2011  >> shows multiple noncalcified pulmonary nodules 3 to 6 mm, largest being 6 mm  NP SG 2008 showed AHI of 100/hour which was corrected by CPAP of 14 cm.  rheumatology evaluation>>positive ANA and positive RA factor but negative CCP and no evidence of synovitis , ENA neg ACE 40  Review of Systems neg for any significant sore throat, dysphagia, itching, sneezing, nasal congestion or excess/ purulent secretions, fever, chills, sweats, unintended wt loss, pleuritic or exertional cp, hempoptysis, orthopnea pnd or change in chronic leg swelling. Also denies presyncope,  palpitations, heartburn, abdominal pain, nausea, vomiting, diarrhea or change in bowel or urinary habits, dysuria,hematuria, rash, arthralgias, visual complaints, headache, numbness weakness or ataxia.     Objective:   Physical Exam  Gen. Pleasant, obese, in no distress ENT - no lesions, no post nasal drip Neck: No JVD, no thyromegaly, no carotid bruits Lungs: no use of accessory muscles, no dullness to percussion, decreased without rales or rhonchi  Cardiovascular: Rhythm regular, heart sounds  normal, no murmurs or gallops, 1+ peripheral edema Musculoskeletal: No deformities, no cyanosis or clubbing , no tremors       Assessment & Plan:

## 2020-02-11 NOTE — Assessment & Plan Note (Signed)
Repeat CT chest without contrast to assess.  Possibility of sarcoidosis exists.  Nodules have been present for a long time. We will also obtain PFTs to rule out obstruction

## 2020-02-11 NOTE — Assessment & Plan Note (Signed)
Encouraged to monitor her weight at least twice daily and if weight gain of more than 3 pounds, take an extra dose of Lasix

## 2020-02-11 NOTE — Assessment & Plan Note (Signed)
Not impressed with 8 minutes saturation drop on ONO on CPAP/ RA She will continue to use oxygen as needed during the daytime and if persistent can use it during sleep-1 L/min should suffice. Reason for hypoxia would likely be chronic diastolic heart failure

## 2020-02-18 ENCOUNTER — Ambulatory Visit (INDEPENDENT_AMBULATORY_CARE_PROVIDER_SITE_OTHER)
Admission: RE | Admit: 2020-02-18 | Discharge: 2020-02-18 | Disposition: A | Discharge status: Home / Self Care | Payer: BC Managed Care – PPO | Source: Ambulatory Visit | Attending: Pulmonary Disease | Admitting: Pulmonary Disease

## 2020-02-18 ENCOUNTER — Other Ambulatory Visit: Payer: Self-pay

## 2020-02-18 DIAGNOSIS — I5032 Chronic diastolic (congestive) heart failure: Secondary | ICD-10-CM

## 2020-02-18 DIAGNOSIS — G4733 Obstructive sleep apnea (adult) (pediatric): Secondary | ICD-10-CM | POA: Diagnosis not present

## 2020-02-18 DIAGNOSIS — R918 Other nonspecific abnormal finding of lung field: Secondary | ICD-10-CM | POA: Diagnosis not present

## 2020-02-18 DIAGNOSIS — J9611 Chronic respiratory failure with hypoxia: Secondary | ICD-10-CM | POA: Diagnosis not present

## 2020-02-18 DIAGNOSIS — Z9989 Dependence on other enabling machines and devices: Secondary | ICD-10-CM

## 2020-02-23 NOTE — Progress Notes (Signed)
Patient returned phone call and writer went over CT results per Dr Elsworth Soho. All questions answered and patient expressed full understanding. Nothing further needed at this time.

## 2020-02-23 NOTE — Progress Notes (Signed)
Attempted to call patient and left message on voicemail to please return phone call. Contact number provided.

## 2020-02-25 ENCOUNTER — Ambulatory Visit (INDEPENDENT_AMBULATORY_CARE_PROVIDER_SITE_OTHER): Payer: BC Managed Care – PPO | Admitting: Pulmonary Disease

## 2020-02-25 ENCOUNTER — Encounter (INDEPENDENT_AMBULATORY_CARE_PROVIDER_SITE_OTHER): Payer: Self-pay | Admitting: Family Medicine

## 2020-02-25 ENCOUNTER — Ambulatory Visit (INDEPENDENT_AMBULATORY_CARE_PROVIDER_SITE_OTHER): Payer: BC Managed Care – PPO | Admitting: Family Medicine

## 2020-02-25 ENCOUNTER — Other Ambulatory Visit: Payer: Self-pay

## 2020-02-25 VITALS — BP 145/86 | HR 87 | Temp 98.3°F | Ht 63.0 in | Wt 310.0 lb

## 2020-02-25 DIAGNOSIS — J9611 Chronic respiratory failure with hypoxia: Secondary | ICD-10-CM

## 2020-02-25 DIAGNOSIS — R7303 Prediabetes: Secondary | ICD-10-CM | POA: Diagnosis not present

## 2020-02-25 DIAGNOSIS — I5032 Chronic diastolic (congestive) heart failure: Secondary | ICD-10-CM

## 2020-02-25 DIAGNOSIS — R918 Other nonspecific abnormal finding of lung field: Secondary | ICD-10-CM

## 2020-02-25 DIAGNOSIS — E66813 Obesity, class 3: Secondary | ICD-10-CM

## 2020-02-25 DIAGNOSIS — I10 Essential (primary) hypertension: Secondary | ICD-10-CM

## 2020-02-25 DIAGNOSIS — Z6841 Body Mass Index (BMI) 40.0 and over, adult: Secondary | ICD-10-CM

## 2020-02-25 DIAGNOSIS — G4733 Obstructive sleep apnea (adult) (pediatric): Secondary | ICD-10-CM

## 2020-02-25 LAB — PULMONARY FUNCTION TEST
DL/VA % pred: 309 %
DL/VA: 13.32 ml/min/mmHg/L
DLCO cor % pred: 162 %
DLCO cor: 32.64 ml/min/mmHg
DLCO unc % pred: 89 %
DLCO unc: 17.9 ml/min/mmHg
FEF 25-75 Post: 0.51 L/sec
FEF 25-75 Pre: 0.83 L/sec
FEF2575-%Change-Post: -38 %
FEF2575-%Pred-Post: 19 %
FEF2575-%Pred-Pre: 32 %
FEV1-%Change-Post: -11 %
FEV1-%Pred-Post: 33 %
FEV1-%Pred-Pre: 37 %
FEV1-Post: 0.88 L
FEV1-Pre: 0.99 L
FEV1FVC-%Change-Post: 0 %
FEV1FVC-%Pred-Pre: 95 %
FEV6-%Change-Post: -12 %
FEV6-%Pred-Post: 35 %
FEV6-%Pred-Pre: 40 %
FEV6-Post: 1.15 L
FEV6-Pre: 1.31 L
FEV6FVC-%Pred-Post: 102 %
FEV6FVC-%Pred-Pre: 102 %
FVC-%Change-Post: -12 %
FVC-%Pred-Post: 34 %
FVC-%Pred-Pre: 39 %
FVC-Post: 1.15 L
FVC-Pre: 1.31 L
Post FEV1/FVC ratio: 76 %
Post FEV6/FVC ratio: 100 %
Pre FEV1/FVC ratio: 76 %
Pre FEV6/FVC Ratio: 100 %
RV % pred: 111 %
RV: 2 L
TLC % pred: 70 %
TLC: 3.48 L

## 2020-02-25 NOTE — Progress Notes (Signed)
Full PFT performed today. °

## 2020-02-26 NOTE — Progress Notes (Signed)
Chief Complaint:   OBESITY Madison Palmer is here to discuss her progress with her obesity treatment plan along with follow-up of her obesity related diagnoses. Madison Palmer is keeping a food journal and adhering to recommended goals of 1500 calories and 80 grams of protein and states she is following her eating plan approximately 100% of the time. Madison Palmer states she is being physically active 30 minutes 2 times per week.  Today's visit was #: 25 Starting weight: 357 lbs Starting date: 02/27/2018 Today's weight: 310 lbs Today's date: 02/25/2020 Total lbs lost to date: 47 Total lbs lost since last in-office visit: 0  Interim History: Madison Palmer reports getting around 1500-1600 calories daily and is getting all of her protein in and more with increase in calories. She is eating out frequently. She is often realizing after the fact how many calories she is consuming. She is still trying to figure out what her new normal is.  Subjective:   Essential hypertension. Blood pressure is well controlled today. No chest pain, chest pressure, or headache. Madison Palmer is being managed by Cardiology.  BP Readings from Last 3 Encounters:  02/25/20 (!) 145/86  02/11/20 128/84  02/03/20 139/85   Lab Results  Component Value Date   CREATININE 1.16 (H) 12/11/2019   CREATININE 1.30 (H) 12/30/2018   CREATININE 1.13 (H) 12/24/2018   Prediabetes. Madison Palmer has a diagnosis of prediabetes based on her elevated HgA1c and was informed this puts her at greater risk of developing diabetes. She continues to work on diet and exercise to decrease her risk of diabetes. She denies nausea or hypoglycemia. Last A1c 5.7. Madison Palmer is on metformin daily and denies GI side effects.  Lab Results  Component Value Date   HGBA1C 5.7 (H) 12/11/2019   Lab Results  Component Value Date   INSULIN 11.4 12/11/2019   INSULIN 21.1 12/30/2018   INSULIN 9.6 02/27/2018   Assessment/Plan:   Essential hypertension. Hassie is working on  healthy weight loss and exercise to improve blood pressure control. We will watch for signs of hypotension as she continues her lifestyle modifications. She will follow-up with Dr. Percival Spanish as scheduled.  Prediabetes. Madison Palmer will continue to work on weight loss, exercise, and decreasing simple carbohydrates to help decrease the risk of diabetes. Follow-up labs will be checked in January 2022.  Class 3 severe obesity with serious comorbidity and body mass index (BMI) of 50.0 to 59.9 in adult, unspecified obesity type ([facility]).  Madison Palmer is currently in the action stage of change. As such, her goal is to continue with weight loss efforts. She has agreed to keeping a food journal and adhering to recommended goals of 1500 calories and 90+ grams of protein.   Exercise goals: No exercise has been prescribed at this time.  Behavioral modification strategies: increasing lean protein intake, planning for success and keeping a strict food journal.  Madison Palmer has agreed to follow-up with our clinic in 2 weeks. She was informed of the importance of frequent follow-up visits to maximize her success with intensive lifestyle modifications for her multiple health conditions.   Objective:   Blood pressure (!) 145/86, pulse 87, temperature 98.3 F (36.8 C), temperature source Oral, height 5\' 3"  (1.6 m), weight (!) 310 lb (140.6 kg), last menstrual period 05/02/2017, SpO2 97 %. Body mass index is 54.91 kg/m.  General: Cooperative, alert, well developed, in no acute distress. HEENT: Conjunctivae and lids unremarkable. Cardiovascular: Regular rhythm.  Lungs: Normal work of breathing. Neurologic: No focal deficits.  Lab Results  Component Value Date   CREATININE 1.16 (H) 12/11/2019   BUN 17 12/11/2019   NA 141 12/11/2019   K 4.4 12/11/2019   CL 99 12/11/2019   CO2 29 12/11/2019   Lab Results  Component Value Date   ALT 13 12/11/2019   AST 12 12/11/2019   ALKPHOS 92 12/11/2019   BILITOT 0.5 12/11/2019    Lab Results  Component Value Date   HGBA1C 5.7 (H) 12/11/2019   HGBA1C 5.7 (H) 12/24/2018   HGBA1C 6.0 (H) 02/27/2018   HGBA1C 5.7 09/10/2017   HGBA1C 5.9 05/29/2017   Lab Results  Component Value Date   INSULIN 11.4 12/11/2019   INSULIN 21.1 12/30/2018   INSULIN 9.6 02/27/2018   Lab Results  Component Value Date   TSH 3.010 12/11/2019   Lab Results  Component Value Date   CHOL 213 (H) 12/11/2019   HDL 48 12/11/2019   LDLCALC 143 (H) 12/11/2019   TRIG 122 12/11/2019   CHOLHDL 5.6 (H) 12/24/2018   Lab Results  Component Value Date   WBC 5.4 09/25/2018   HGB 12.7 09/25/2018   HCT 40.4 09/25/2018   MCV 94.6 09/25/2018   PLT 229 09/25/2018   Lab Results  Component Value Date   IRON 44 02/20/2018   TIBC 347 05/29/2017   Attestation Statements:   Reviewed by clinician on day of visit: allergies, medications, problem list, medical history, surgical history, family history, social history, and previous encounter notes.  Time spent on visit including pre-visit chart review and post-visit charting and care was 15 minutes.   I, Michaelene Song, am acting as transcriptionist for Coralie Common, MD   I have reviewed the above documentation for accuracy and completeness, and I agree with the above. - Jinny Blossom, MD

## 2020-03-11 ENCOUNTER — Telehealth: Payer: Self-pay | Admitting: Pulmonary Disease

## 2020-03-11 ENCOUNTER — Ambulatory Visit (INDEPENDENT_AMBULATORY_CARE_PROVIDER_SITE_OTHER): Payer: BC Managed Care – PPO | Admitting: Family Medicine

## 2020-03-11 ENCOUNTER — Encounter (INDEPENDENT_AMBULATORY_CARE_PROVIDER_SITE_OTHER): Payer: Self-pay | Admitting: Family Medicine

## 2020-03-11 ENCOUNTER — Other Ambulatory Visit: Payer: Self-pay

## 2020-03-11 VITALS — BP 143/88 | HR 69 | Temp 98.3°F | Ht 63.0 in | Wt 312.0 lb

## 2020-03-11 DIAGNOSIS — Z6841 Body Mass Index (BMI) 40.0 and over, adult: Secondary | ICD-10-CM

## 2020-03-11 DIAGNOSIS — E038 Other specified hypothyroidism: Secondary | ICD-10-CM

## 2020-03-11 DIAGNOSIS — E66813 Obesity, class 3: Secondary | ICD-10-CM

## 2020-03-11 DIAGNOSIS — Z9189 Other specified personal risk factors, not elsewhere classified: Secondary | ICD-10-CM

## 2020-03-11 DIAGNOSIS — I10 Essential (primary) hypertension: Secondary | ICD-10-CM

## 2020-03-11 MED ORDER — LEVOTHYROXINE SODIUM 75 MCG PO TABS: 75.0000 ug | ORAL_TABLET | Freq: Every day | ORAL | 0 refills | Status: DC

## 2020-03-11 NOTE — Telephone Encounter (Signed)
Pt called requesting to know the results of her PFT which was performed on 02/25/20.  Dr. Elsworth Soho, please advise.

## 2020-03-12 NOTE — Telephone Encounter (Signed)
Spoke with the pt and notified of results per Dr Elsworth Soho and she verbalized understanding. Nothing further needed.

## 2020-03-12 NOTE — Telephone Encounter (Signed)
Severe restriction more related to obesity than sarcoidosis

## 2020-03-15 NOTE — Progress Notes (Signed)
Chief Complaint:   OBESITY Madison Palmer is here to discuss her progress with her obesity treatment plan along with follow-up of her obesity related diagnoses. Madison Palmer is on keeping a food journal and adhering to recommended goals of 1500 calories and 90+ grams of protein daily and states she is following her eating plan approximately 50% of the time. Madison Palmer states she is doing 0 minutes 0 times per week.  Today's visit was #: 26 Starting weight: 357 lbs Starting date: 02/27/2018 Today's weight: 312 lbs Today's date: 03/11/2020 Total lbs lost to date: 45 Total lbs lost since last in-office visit: 0  Interim History: Madison Palmer voices the last few weeks she has been trying to keep up with her son in terms of all his activities. She has been making more consistent indulgent choices when eating out over the last few weeks. She knows what her more nutritious options are but she has been making more consistent indulgent choices. She is not planning on anything grandiose for Thanksgiving.  Subjective:   1. Other specified hypothyroidism [name] denies cold/heat intolerance of palpitations. She is on levothyroxine 75 mcg daily.  2. Essential hypertension [name]'s blood pressure is minimally elevated today. She denies chest pain, chest pressure, or headache. She is on amlodipine, Clonidine, furosemide, normodyne, and Zestril.  3. At risk for activity intolerance Madison Palmer is at risk for exercise intolerance due to her not exercising currently.  Assessment/Plan:   1. Other specified hypothyroidism Patient with long-standing hypothyroidism. We will refill levothyroxine for 1 month. [name] appears euthyroid. Orders and follow up as documented in patient record.  Counseling . Good thyroid control is important for overall health. Supratherapeutic thyroid levels are dangerous and will not improve weight loss results. . The correct way to take levothyroxine is fasting, with water, separated by at least 30  minutes from breakfast, and separated by more than 4 hours from calcium, iron, multivitamins, acid reflux medications (PPIs).   - levothyroxine (SYNTHROID) 75 MCG tablet; Take 1 tablet (75 mcg total) by mouth daily.  Dispense: 30 tablet; Refill: 0  2. Essential hypertension [name] is working on healthy weight loss and exercise to improve blood pressure control. We will watch for signs of hypotension as she continues her lifestyle modifications. [name] will continue her current medications, no change in dose, and she will follow up with Dr. Percival Spanish.  3. At risk for activity intolerance [name] was given approximately 15 minutes of exercise intolerance counseling today. She is 54 y.o. female and has risk factors exercise intolerance including obesity. We discussed intensive lifestyle modifications today with an emphasis on specific weight loss instructions and strategies. [name] will slowly increase activity as tolerated.  Repetitive spaced learning was employed today to elicit superior memory formation and behavioral change.  4. Class 3 severe obesity with serious comorbidity and body mass index (BMI) of 50.0 to 59.9 in adult, unspecified obesity type ([facility]) Madison Palmer is currently in the action stage of change. As such, her goal is to continue with weight loss efforts. She has agreed to keeping a food journal and adhering to recommended goals of 1500 calories and 90+ grams of protein daily.   Exercise goals: No exercise has been prescribed at this time.  Behavioral modification strategies: increasing lean protein intake, meal planning and cooking strategies, keeping healthy foods in the home, holiday eating strategies , planning for success and keeping a strict food journal.  Madison Palmer has agreed to follow-up with our clinic in 4 weeks. She was informed of the  importance of frequent follow-up visits to maximize her success with intensive lifestyle modifications for her multiple health conditions.    Objective:   Blood pressure (!) 143/88, pulse 69, temperature 98.3 F (36.8 C), temperature source Oral, height 5\' 3"  (1.6 m), weight (!) 312 lb (141.5 kg), last menstrual period 05/02/2017, SpO2 98 %. Body mass index is 55.27 kg/m.  General: Cooperative, alert, well developed, in no acute distress. HEENT: Conjunctivae and lids unremarkable. Cardiovascular: Regular rhythm.  Lungs: Normal work of breathing. Neurologic: No focal deficits.   Lab Results  Component Value Date   CREATININE 1.16 (H) 12/11/2019   BUN 17 12/11/2019   NA 141 12/11/2019   K 4.4 12/11/2019   CL 99 12/11/2019   CO2 29 12/11/2019   Lab Results  Component Value Date   ALT 13 12/11/2019   AST 12 12/11/2019   ALKPHOS 92 12/11/2019   BILITOT 0.5 12/11/2019   Lab Results  Component Value Date   HGBA1C 5.7 (H) 12/11/2019   HGBA1C 5.7 (H) 12/24/2018   HGBA1C 6.0 (H) 02/27/2018   HGBA1C 5.7 09/10/2017   HGBA1C 5.9 05/29/2017   Lab Results  Component Value Date   INSULIN 11.4 12/11/2019   INSULIN 21.1 12/30/2018   INSULIN 9.6 02/27/2018   Lab Results  Component Value Date   TSH 3.010 12/11/2019   Lab Results  Component Value Date   CHOL 213 (H) 12/11/2019   HDL 48 12/11/2019   LDLCALC 143 (H) 12/11/2019   TRIG 122 12/11/2019   CHOLHDL 5.6 (H) 12/24/2018   Lab Results  Component Value Date   WBC 5.4 09/25/2018   HGB 12.7 09/25/2018   HCT 40.4 09/25/2018   MCV 94.6 09/25/2018   PLT 229 09/25/2018   Lab Results  Component Value Date   IRON 44 02/20/2018   TIBC 347 05/29/2017   Attestation Statements:   Reviewed by clinician on day of visit: allergies, medications, problem list, medical history, surgical history, family history, social history, and previous encounter notes.   I, Trixie Dredge, am acting as transcriptionist for Coralie Common, MD.  I have reviewed the above documentation for accuracy and completeness, and I agree with the above. - Jinny Blossom,  MD

## 2020-04-07 ENCOUNTER — Other Ambulatory Visit (INDEPENDENT_AMBULATORY_CARE_PROVIDER_SITE_OTHER): Payer: Self-pay | Admitting: Family Medicine

## 2020-04-07 DIAGNOSIS — E038 Other specified hypothyroidism: Secondary | ICD-10-CM

## 2020-04-08 ENCOUNTER — Encounter (INDEPENDENT_AMBULATORY_CARE_PROVIDER_SITE_OTHER): Payer: Self-pay

## 2020-04-08 ENCOUNTER — Ambulatory Visit (INDEPENDENT_AMBULATORY_CARE_PROVIDER_SITE_OTHER): Payer: BC Managed Care – PPO | Admitting: Family Medicine

## 2020-04-08 NOTE — Telephone Encounter (Signed)
This patient was last seen by Dr. Jearld Shines, and currently has an upcoming appt scheduled on 04/14/20 with Jake Bathe, [place].

## 2020-04-12 ENCOUNTER — Other Ambulatory Visit: Payer: Self-pay

## 2020-04-12 ENCOUNTER — Telehealth (INDEPENDENT_AMBULATORY_CARE_PROVIDER_SITE_OTHER): Payer: BC Managed Care – PPO | Admitting: Psychiatry

## 2020-04-12 ENCOUNTER — Encounter (HOSPITAL_COMMUNITY): Payer: Self-pay | Admitting: Psychiatry

## 2020-04-12 DIAGNOSIS — F419 Anxiety disorder, unspecified: Secondary | ICD-10-CM | POA: Diagnosis not present

## 2020-04-12 DIAGNOSIS — F319 Bipolar disorder, unspecified: Secondary | ICD-10-CM | POA: Diagnosis not present

## 2020-04-12 MED ORDER — DIVALPROEX SODIUM 500 MG PO DR TAB: 500.0000 mg | DELAYED_RELEASE_TABLET | Freq: Every day | ORAL | 2 refills | Status: DC

## 2020-04-12 MED ORDER — LURASIDONE HCL 60 MG PO TABS: 60.0000 mg | ORAL_TABLET | Freq: Every morning | ORAL | 2 refills | Status: DC

## 2020-04-12 NOTE — Progress Notes (Signed)
Virtual Visit via Telephone Note  I connected with Madison Palmer on 04/12/20 at  1:00 PM EST by telephone and verified that I am speaking with the correct person using two identifiers.  Location: Patient: Home Provider: Home Office   I discussed the limitations, risks, security and privacy concerns of performing an evaluation and management service by telephone and the availability of in person appointments. I also discussed with the patient that there may be a patient responsible charge related to this service. The patient expressed understanding and agreed to proceed.   History of Present Illness: Patient is evaluated by phone session.  She is on the phone by herself.  On the last visit we recommended to try Depakote 250 mg twice a day because she was taking all at bedtime and she was sleeping too much.  Not she noticed that her sleep is fragmented and she is having nightmares and flashback.  Otherwise she reported her mood is stable and she denies any mania, psychosis or any hallucination.  She had blood work on her Depakote level is 23 which is subtherapeutic.  Her anxiety is also well controlled.  She like to go back on Depakote to take all at bedtime.  She has no tremors, shakes or any EPS.  She feels the [place] helping her mood and she denies any recent suicidal thoughts.  She like to stay in [place] and now she has physicians here she is happy.  Her son started school and going well.  She denies drinking or using any illegal substances.  Past psychiatric history; H/Odepression since early age. [facility] delusion, psychosis and violent behavior. D/C on Depakote 500 mg BID, Latuda 60 mg daily and Trilafon 12 mg a day. No h/osuicidalattempt,nightmares, panic attack or OCD symptoms.No h/odrinking and drug use.  Recent Results (from the past 2160 hour(s))  Valproic Acid level     Status: Abnormal   Collection Time:  02/03/20  4:30 PM  Result Value Ref Range   Valproic Acid Lvl 23 (L) 50 - 100 ug/mL    Comment:                                 Detection Limit = 4                            <4 indicates None Detected Toxicity may occur at levels of 100-500. Measurements of free unbound valproic acid may improve the assess- ment of clinical response.   Pulmonary function test     Status: None   Collection Time: 02/25/20  3:50 PM  Result Value Ref Range   FVC-Pre 1.31 L   FVC-%Pred-Pre 39 %   FVC-Post 1.15 L   FVC-%Pred-Post 34 %   FVC-%Change-Post -12 %   FEV1-Pre 0.99 L   FEV1-%Pred-Pre 37 %   FEV1-Post 0.88 L   FEV1-%Pred-Post 33 %   FEV1-%Change-Post -11 %   FEV6-Pre 1.31 L   FEV6-%Pred-Pre 40 %   FEV6-Post 1.15 L   FEV6-%Pred-Post 35 %   FEV6-%Change-Post -12 %   Pre FEV1/FVC ratio 76 %   FEV1FVC-%Pred-Pre 95 %   Post FEV1/FVC ratio 76 %   FEV1FVC-%Change-Post 0 %   Pre FEV6/FVC Ratio 100 %   FEV6FVC-%Pred-Pre 102 %   Post FEV6/FVC ratio 100 %   FEV6FVC-%Pred-Post 102 %  FEF 25-75 Pre 0.83 L/sec   FEF2575-%Pred-Pre 32 %   FEF 25-75 Post 0.51 L/sec   FEF2575-%Pred-Post 19 %   FEF2575-%Change-Post -38 %   RV 2.00 L   RV % pred 111 %   TLC 3.48 L   TLC % pred 70 %   DLCO unc 17.90 ml/min/mmHg   DLCO unc % pred 89 %   DLCO cor 32.64 ml/min/mmHg   DLCO cor % pred 162 %   DL/VA 13.32 ml/min/mmHg/L   DL/VA % pred 309 %     Psychiatric Specialty Exam: Physical Exam  Review of Systems  Weight (!) 312 lb (141.5 kg), last menstrual period 05/02/2017.There is no height or weight on file to calculate BMI.  General Appearance: NA  Eye Contact:  NA  Speech:  Slow  Volume:  Normal  Mood:  Irritable  Affect:  NA  Thought Process:  Goal Directed  Orientation:  Full (Time, Place, and Person)  Thought Content:  WDL  Suicidal Thoughts:  No  Homicidal Thoughts:  No  Memory:  Immediate;   Good Recent;   Good Remote;   Good  Judgement:  Intact  Insight:  Present  Psychomotor  Activity:  NA  Concentration:  Concentration: Fair and Attention Span: Fair  Recall:  Good  Fund of Knowledge:  Good  Language:  Good  Akathisia:  No  Handed:  Right  AIMS (if indicated):     Assets:  Communication Skills Desire for Improvement Housing Resilience Social Support  ADL's:  Intact  Cognition:  WNL  Sleep:   poor      Assessment and Plan: Bipolar disorder type I.  Anxiety.  Patient like to go back on Depakote at bedtime since it did help her sleep.  She noticed taking the Depakote in divided doses not helping her sleep and she is having nightmares, irritability and racing thoughts.  We will change the Depakote to take 500 mg at bedtime and continue Latuda 60 mg in the morning.  I reviewed blood work results.  I recommend to call us back if she has any question or any concern.  Encouraged exercise and healthy lifestyle.  Follow-up in 3 months.  Follow Up Instructions:    I discussed the assessment and treatment plan with the patient. The patient was provided an opportunity to ask questions and all were answered. The patient agreed with the plan and demonstrated an understanding of the instructions.   The patient was advised to call back or seek an in-person evaluation if the symptoms worsen or if the condition fails to improve as anticipated.  I provided 16 minutes of non-face-to-face time during this encounter.   Kathlee Nations, MD

## 2020-04-14 ENCOUNTER — Encounter (INDEPENDENT_AMBULATORY_CARE_PROVIDER_SITE_OTHER): Payer: Self-pay | Admitting: Family Medicine

## 2020-04-14 ENCOUNTER — Ambulatory Visit (INDEPENDENT_AMBULATORY_CARE_PROVIDER_SITE_OTHER): Payer: BC Managed Care – PPO | Admitting: Family Medicine

## 2020-04-14 ENCOUNTER — Other Ambulatory Visit: Payer: Self-pay

## 2020-04-14 VITALS — BP 146/78 | HR 74 | Temp 97.9°F | Ht 63.0 in | Wt 312.0 lb

## 2020-04-14 DIAGNOSIS — E038 Other specified hypothyroidism: Secondary | ICD-10-CM | POA: Diagnosis not present

## 2020-04-14 DIAGNOSIS — Z9189 Other specified personal risk factors, not elsewhere classified: Secondary | ICD-10-CM | POA: Diagnosis not present

## 2020-04-14 DIAGNOSIS — Z6841 Body Mass Index (BMI) 40.0 and over, adult: Secondary | ICD-10-CM | POA: Diagnosis not present

## 2020-04-14 DIAGNOSIS — I10 Essential (primary) hypertension: Secondary | ICD-10-CM | POA: Diagnosis not present

## 2020-04-14 MED ORDER — LEVOTHYROXINE SODIUM 75 MCG PO TABS: 75.0000 ug | ORAL_TABLET | Freq: Every day | ORAL | 0 refills | Status: DC

## 2020-04-19 ENCOUNTER — Telehealth (HOSPITAL_COMMUNITY): Payer: BC Managed Care – PPO | Admitting: Psychiatry

## 2020-04-19 NOTE — Progress Notes (Signed)
Chief Complaint:   OBESITY Madison Palmer is here to discuss her progress with her obesity treatment plan along with follow-up of her obesity related diagnoses. Madison Palmer is on keeping a food journal and adhering to recommended goals of 1500 calories and 90+ grams of protein daily and states she is following her eating plan approximately 40% of the time. Madison Palmer states she is doing 0 minutes 0 times per week.  Today's visit was #: 27 Starting weight: 357 lbs Starting date: 02/27/2018 Today's weight: 312 lbs Today's date: 04/14/2020 Total lbs lost to date: 45 Total lbs lost since last in-office visit: 0  Interim History: Madison Palmer has maintained her weight over Thanksgiving and she is happy with that. She notes she eats out practically every meal. She reports this is convenient for her. She skips lunch most of the time. She is meeting her protein goals and going over on calories. Her weight has trended up since she started eating out. She averages between 1600-1800 calories per day.  Subjective:   1. Essential hypertension Madison Palmer is managed by Dr. Percival Spanish. Her blood pressure has been elevated the last several visits. At her August 2021 office visit, he instructed her to check her blood pressure 2 times per week and send to him but she has not done this.   BP Readings from Last 3 Encounters:  04/14/20 (!) 146/78  03/11/20 (!) 143/88  02/25/20 (!) 145/86   Lab Results  Component Value Date   CREATININE 1.16 (H) 12/11/2019   CREATININE 1.30 (H) 12/30/2018   CREATININE 1.13 (H) 12/24/2018   2. Other specified hypothyroidism Madison Palmer is stable on 75 mcg levothyroxine. She denies hot/cold intolerance or palpitations. Last TSH was at goal at 3.010.   Lab Results  Component Value Date   TSH 3.010 12/11/2019   3. At risk for activity intolerance Madison Palmer is at risk for exercise intolerance due to osteoarthritis in her knees.  Assessment/Plan:   1. Essential hypertension . Madison Palmer is to check  her blood pressure 2 times per week in send results to Dr. Percival Spanish for med titration.  2. Other specified hypothyroidism Madison Palmer with long-standing hypothyroidism, and we will refill levothyroxine for 1 month.- levothyroxine (SYNTHROID) 75 MCG tablet; Take 1 tablet (75 mcg total) by mouth daily.  Dispense: 30 tablet; Refill: 0  3. At risk for activity intolerance Madison Palmer was given approximately 15 minutes of exercise intolerance counseling today. She is 54 y.o. female and has risk factors exercise intolerance including obesity. We discussed intensive lifestyle modifications today with an emphasis on specific weight loss instructions and strategies. Madison Palmer will slowly increase activity as tolerated.  Repetitive spaced learning was employed today to elicit superior memory formation and behavioral change.  4. Class 3 severe obesity with serious comorbidity and body mass index (BMI) of 50.0 to 59.9 in adult, unspecified obesity type (HCC) Madison Palmer is currently in the action stage of change. As such, her goal is to continue with weight loss efforts. She has agreed to keeping a food journal and adhering to recommended goals of 1500 calories and 90+ grams of protein daily.   We will recheck fasting labs at her next office visit.  Exercise goals: Madison Palmer will start with her trainer at the [facility].  Behavioral modification strategies: decreasing simple carbohydrates, decreasing eating out and meal planning and cooking strategies.  Madison Palmer has agreed to follow-up with our clinic in 3 weeks with Dr. Jearld Shines.   Objective:   Blood pressure (!) 146/78, pulse 74, temperature 97.9 F (  36.6 C), height 5\' 3"  (1.6 m), weight (!) 312 lb (141.5 kg), last menstrual period 05/02/2017, SpO2 99 %. Body mass index is 55.27 kg/m.  Madison Palmer uses a cane for ambulation. General: Cooperative, alert, well developed, in no acute distress. HEENT: Conjunctivae and lids unremarkable. Cardiovascular: Regular rhythm.  Lungs: Normal  work of breathing. Neurologic: No focal deficits.   Lab Results  Component Value Date   CREATININE 1.16 (H) 12/11/2019   BUN 17 12/11/2019   NA 141 12/11/2019   K 4.4 12/11/2019   CL 99 12/11/2019   CO2 29 12/11/2019   Lab Results  Component Value Date   ALT 13 12/11/2019   AST 12 12/11/2019   ALKPHOS 92 12/11/2019   BILITOT 0.5 12/11/2019   Lab Results  Component Value Date   HGBA1C 5.7 (H) 12/11/2019   HGBA1C 5.7 (H) 12/24/2018   HGBA1C 6.0 (H) 02/27/2018   HGBA1C 5.7 09/10/2017   HGBA1C 5.9 05/29/2017   Lab Results  Component Value Date   INSULIN 11.4 12/11/2019   INSULIN 21.1 12/30/2018   INSULIN 9.6 02/27/2018   Lab Results  Component Value Date   TSH 3.010 12/11/2019   Lab Results  Component Value Date   CHOL 213 (H) 12/11/2019   HDL 48 12/11/2019   LDLCALC 143 (H) 12/11/2019   TRIG 122 12/11/2019   CHOLHDL 5.6 (H) 12/24/2018   Lab Results  Component Value Date   WBC 5.4 09/25/2018   HGB 12.7 09/25/2018   HCT 40.4 09/25/2018   MCV 94.6 09/25/2018   PLT 229 09/25/2018   Lab Results  Component Value Date   IRON 44 02/20/2018   TIBC 347 05/29/2017   Attestation Statements:   Reviewed by clinician on day of visit: allergies, medications, problem list, medical history, surgical history, family history, social history, and previous encounter notes.   Wilhemena Durie, am acting as Location manager for [employer], FNP-C.  I have reviewed the above documentation for accuracy and completeness, and I agree with the above. -  Georgianne Fick, FNP

## 2020-04-20 ENCOUNTER — Encounter (INDEPENDENT_AMBULATORY_CARE_PROVIDER_SITE_OTHER): Payer: Self-pay | Admitting: Family Medicine

## 2020-05-06 ENCOUNTER — Other Ambulatory Visit: Payer: Self-pay | Admitting: Gastroenterology

## 2020-05-06 ENCOUNTER — Other Ambulatory Visit: Payer: Self-pay | Admitting: Cardiology

## 2020-05-12 ENCOUNTER — Encounter (INDEPENDENT_AMBULATORY_CARE_PROVIDER_SITE_OTHER): Payer: Self-pay

## 2020-05-13 ENCOUNTER — Encounter (INDEPENDENT_AMBULATORY_CARE_PROVIDER_SITE_OTHER): Payer: Self-pay | Admitting: Family Medicine

## 2020-05-13 ENCOUNTER — Other Ambulatory Visit: Payer: Self-pay

## 2020-05-13 ENCOUNTER — Telehealth (INDEPENDENT_AMBULATORY_CARE_PROVIDER_SITE_OTHER): Payer: BC Managed Care – PPO | Admitting: Family Medicine

## 2020-05-13 DIAGNOSIS — E038 Other specified hypothyroidism: Secondary | ICD-10-CM | POA: Diagnosis not present

## 2020-05-13 DIAGNOSIS — E7849 Other hyperlipidemia: Secondary | ICD-10-CM | POA: Diagnosis not present

## 2020-05-13 DIAGNOSIS — Z6841 Body Mass Index (BMI) 40.0 and over, adult: Secondary | ICD-10-CM

## 2020-05-13 MED ORDER — LEVOTHYROXINE SODIUM 75 MCG PO TABS
75.0000 ug | ORAL_TABLET | Freq: Every day | ORAL | 0 refills | Status: DC
Start: 2020-05-13 — End: 2020-06-15

## 2020-05-19 NOTE — Progress Notes (Signed)
TeleHealth Visit:  Due to the COVID-19 pandemic, this visit was completed with telemedicine (audio/video) technology to reduce patient and provider exposure as well as to preserve personal protective equipment.   Gabrial has verbally consented to this TeleHealth visit. The patient is located at home, the provider is located at the [facility] office. The participants in this visit include the listed provider and patient. The visit was conducted today via MyChart video.  Chief Complaint: OBESITY Lamika is here to discuss her progress with her obesity treatment plan along with follow-up of her obesity related diagnoses. Capri is on keeping a food journal and adhering to recommended goals of 1000-1200 calories and 80-100 grams of protein and states she is following her eating plan approximately 0% of the time. Alisandra states she is not exercising regularly at this time.  Today's visit was #: 28 Starting weight: 357 lbs Starting date: 02/27/2018  Interim History: Maryclaire says her holidays were fine.  She went out of town to visit her mother-in-law, who has been ill.  She says her calories went up, but she has tracked her food.  She says she had been ending up at 1800-2100 calories (closer to 2100 calories).  She says she had mostly fast food.  Subjective:   1. Other specified hypothyroidism Denies cold/heat intolerance or palpitations.  Last TSH 3.010, T3 106, T4 9.7.  Binaca is taking levothyroxine 75 mcg daily.  Lab Results  Component Value Date   TSH 3.010 12/11/2019   2. Other hyperlipidemia Dorsey has hyperlipidemia and has been trying to improve her cholesterol levels with intensive lifestyle modification including a low saturated fat diet, exercise and weight loss. She denies any chest pain, claudication or myalgias.  She is not on a statin.  Lab Results  Component Value Date   ALT 13 12/11/2019   AST 12 12/11/2019   ALKPHOS 92 12/11/2019   BILITOT 0.5 12/11/2019    Lab Results  Component Value Date   CHOL 213 (H) 12/11/2019   HDL 48 12/11/2019   LDLCALC 143 (H) 12/11/2019   TRIG 122 12/11/2019   CHOLHDL 5.6 (H) 12/24/2018   Assessment/Plan:   1. Other specified hypothyroidism Patient with long-standing hypothyroidism, on levothyroxine therapy. She appears euthyroid. Orders and follow up as documented in patient record.  Will refill levothyroxine, as per below.  She will need labs at her next appointment.  Counseling . Good thyroid control is important for overall health. Supratherapeutic thyroid levels are dangerous and will not improve weight loss results. . The correct way to take levothyroxine is fasting, with water, separated by at least 30 minutes from breakfast, and separated by more than 4 hours from calcium, iron, multivitamins, acid reflux medications (PPIs).   -Refill levothyroxine (SYNTHROID) 75 MCG tablet; Take 1 tablet (75 mcg total) by mouth daily.  Dispense: 30 tablet; Refill: 0  2. Other hyperlipidemia Cardiovascular risk and specific lipid/LDL goals reviewed.  We discussed several lifestyle modifications today and Kalista will continue to work on diet, exercise and weight loss efforts. Orders and follow up as documented in patient record.  Will check labs at next appointment.  Counseling Intensive lifestyle modifications are the first line treatment for this issue. . Dietary changes: Increase soluble fiber. Decrease simple carbohydrates. . Exercise changes: Moderate to vigorous-intensity aerobic activity 150 minutes per week if tolerated. . Lipid-lowering medications: see documented in medical record.  3. Class 3 severe obesity with serious comorbidity and body mass index (BMI) of 50.0 to  59.9 in adult, unspecified obesity type ([place])  Hanae is currently in the action stage of change. As such, her goal is to continue with weight loss efforts. She has agreed to keeping a food journal and adhering to recommended goals of 1500  calories and 90+ grams of protein.   Exercise goals: No exercise has been prescribed at this time.  Behavioral modification strategies: increasing lean protein intake, meal planning and cooking strategies, keeping healthy foods in the home and planning for success.  Mertie has agreed to follow-up with our clinic in 2 weeks. She was informed of the importance of frequent follow-up visits to maximize her success with intensive lifestyle modifications for her multiple health conditions.  Objective:   VITALS: Per patient if applicable, see vitals. GENERAL: Alert and in no acute distress. CARDIOPULMONARY: No increased WOB. Speaking in clear sentences.  PSYCH: Pleasant and cooperative. Speech normal rate and rhythm. Affect is appropriate. Insight and judgement are appropriate. Attention is focused, linear, and appropriate.  NEURO: Oriented as arrived to appointment on time with no prompting.   Lab Results  Component Value Date   CREATININE 1.16 (H) 12/11/2019   BUN 17 12/11/2019   NA 141 12/11/2019   K 4.4 12/11/2019   CL 99 12/11/2019   CO2 29 12/11/2019   Lab Results  Component Value Date   ALT 13 12/11/2019   AST 12 12/11/2019   ALKPHOS 92 12/11/2019   BILITOT 0.5 12/11/2019   Lab Results  Component Value Date   HGBA1C 5.7 (H) 12/11/2019   HGBA1C 5.7 (H) 12/24/2018   HGBA1C 6.0 (H) 02/27/2018   HGBA1C 5.7 09/10/2017   HGBA1C 5.9 05/29/2017   Lab Results  Component Value Date   INSULIN 11.4 12/11/2019   INSULIN 21.1 12/30/2018   INSULIN 9.6 02/27/2018   Lab Results  Component Value Date   TSH 3.010 12/11/2019   Lab Results  Component Value Date   CHOL 213 (H) 12/11/2019   HDL 48 12/11/2019   LDLCALC 143 (H) 12/11/2019   TRIG 122 12/11/2019   CHOLHDL 5.6 (H) 12/24/2018   Lab Results  Component Value Date   WBC 5.4 09/25/2018   HGB 12.7 09/25/2018   HCT 40.4 09/25/2018   MCV 94.6 09/25/2018   PLT 229 09/25/2018   Lab Results  Component Value Date   IRON  44 02/20/2018   TIBC 347 05/29/2017   Attestation Statements:   Reviewed by clinician on day of visit: allergies, medications, problem list, medical history, surgical history, family history, social history, and previous encounter notes.  I, Water quality scientist, CMA, am acting as transcriptionist for Coralie Common, MD.  I have reviewed the above documentation for accuracy and completeness, and I agree with the above. - Jinny Blossom, MD

## 2020-06-01 ENCOUNTER — Encounter (INDEPENDENT_AMBULATORY_CARE_PROVIDER_SITE_OTHER): Payer: Self-pay | Admitting: Family Medicine

## 2020-06-01 ENCOUNTER — Other Ambulatory Visit: Payer: Self-pay

## 2020-06-01 ENCOUNTER — Ambulatory Visit (INDEPENDENT_AMBULATORY_CARE_PROVIDER_SITE_OTHER): Payer: BC Managed Care – PPO | Admitting: Family Medicine

## 2020-06-01 VITALS — BP 141/92 | HR 71 | Temp 98.2°F | Ht 63.0 in | Wt 323.0 lb

## 2020-06-01 DIAGNOSIS — E559 Vitamin D deficiency, unspecified: Secondary | ICD-10-CM | POA: Diagnosis not present

## 2020-06-01 DIAGNOSIS — Z9189 Other specified personal risk factors, not elsewhere classified: Secondary | ICD-10-CM

## 2020-06-01 DIAGNOSIS — I10 Essential (primary) hypertension: Secondary | ICD-10-CM

## 2020-06-01 DIAGNOSIS — Z6841 Body Mass Index (BMI) 40.0 and over, adult: Secondary | ICD-10-CM

## 2020-06-01 DIAGNOSIS — R7303 Prediabetes: Secondary | ICD-10-CM | POA: Diagnosis not present

## 2020-06-01 DIAGNOSIS — E038 Other specified hypothyroidism: Secondary | ICD-10-CM | POA: Diagnosis not present

## 2020-06-02 LAB — COMPREHENSIVE METABOLIC PANEL
ALT: 11 IU/L (ref 0–32)
AST: 11 IU/L (ref 0–40)
Albumin/Globulin Ratio: 1.6 (ref 1.2–2.2)
Albumin: 4.1 g/dL (ref 3.8–4.9)
Alkaline Phosphatase: 82 IU/L (ref 44–121)
BUN/Creatinine Ratio: 18 (ref 9–23)
BUN: 18 mg/dL (ref 6–24)
Bilirubin Total: 0.4 mg/dL (ref 0.0–1.2)
CO2: 29 mmol/L (ref 20–29)
Calcium: 10.3 mg/dL — ABNORMAL HIGH (ref 8.7–10.2)
Chloride: 103 mmol/L (ref 96–106)
Creatinine, Ser: 1.01 mg/dL — ABNORMAL HIGH (ref 0.57–1.00)
GFR calc Af Amer: 73 mL/min/{1.73_m2} (ref >59)
GFR calc non Af Amer: 63 mL/min/{1.73_m2} (ref >59)
Globulin, Total: 2.6 g/dL (ref 1.5–4.5)
Glucose: 97 mg/dL (ref 65–99)
Potassium: 4.3 mmol/L (ref 3.5–5.2)
Sodium: 145 mmol/L — ABNORMAL HIGH (ref 134–144)
Total Protein: 6.7 g/dL (ref 6.0–8.5)

## 2020-06-02 LAB — LIPID PANEL WITH LDL/HDL RATIO
Cholesterol, Total: 192 mg/dL (ref 100–199)
HDL: 48 mg/dL (ref >39)
LDL Chol Calc (NIH): 123 mg/dL — ABNORMAL HIGH (ref 0–99)
LDL/HDL Ratio: 2.6 ratio (ref 0.0–3.2)
Triglycerides: 116 mg/dL (ref 0–149)
VLDL Cholesterol Cal: 21 mg/dL (ref 5–40)

## 2020-06-02 LAB — HEMOGLOBIN A1C
Est. average glucose Bld gHb Est-mCnc: 114 mg/dL
Hgb A1c MFr Bld: 5.6 % (ref 4.8–5.6)

## 2020-06-02 LAB — TSH: TSH: 2.43 u[IU]/mL (ref 0.450–4.500)

## 2020-06-02 LAB — INSULIN, RANDOM: INSULIN: 18.9 u[IU]/mL (ref 2.6–24.9)

## 2020-06-02 LAB — VITAMIN D 25 HYDROXY (VIT D DEFICIENCY, FRACTURES): Vit D, 25-Hydroxy: 19.7 ng/mL — ABNORMAL LOW (ref 30.0–100.0)

## 2020-06-02 NOTE — Progress Notes (Signed)
Chief Complaint:   OBESITY Madison Palmer is here to discuss her progress with her obesity treatment plan along with follow-up of her obesity related diagnoses. Madison Palmer is on keeping a food journal and adhering to recommended goals of 1800 calories and 80-100 g protein and states she is following her eating plan approximately 100% of the time. Madison Palmer states she is doing 0 minutes 0 times per week.  Today's visit was #: 41 Starting weight: 357 lbs Starting date: 02/27/2018 Today's weight: 323 lbs Today's date: 06/01/2020 Total lbs lost to date: 34 lbs Total lbs lost since last in-office visit: 0  Interim History: Pt voices she has had a hard last month and a half. She feels disappointed but not deterred. She is cooking more and still eating out some of the time. She is noticing an increase in BP with increase in weight. Pt has no plans for the next few weeks, except for Super Bowl.  Subjective:   1. Essential hypertension BP well controlled previously and slightly higher today.  Pt denies chest pain, chest pressure or headache.  2. Pre-diabetes Last A1c 5.7 on 12/11/2019. Pt is on Metformin with no GI side effects. Pt reports carb cravings.   3. Vitamin D deficiency Pt denies nausea, vomiting, and muscle weakness but notes fatigue. She is not on a Vit D supplement.  4. Other specified hypothyroidism Last TSH within normal limits in August 2021. Pt is on levothyroxine 75 mcg.  5. At risk for diabetes mellitus Madison Palmer is at higher than average risk for developing diabetes due to obesity.   Assessment/Plan:   1. Essential hypertension Madison Palmer is working on healthy weight loss and exercise to improve blood pressure control. We will watch for signs of hypotension as she continues her lifestyle modifications. Continue current meds with no change in dose. Follow up BP at next appointment. Check FLP today.  - Lipid Panel With LDL/HDL Ratio  2. Prediabetes Madison Palmer will continue to work on  weight loss, exercise, and decreasing simple carbohydrates to help decrease the risk of diabetes. Check labs today.  - Comprehensive metabolic panel - Hemoglobin A1c - Insulin, random  3. Vitamin D deficiency Low Vitamin D level contributes to fatigue and are associated with obesity, breast, and colon cancer. She agrees to continue to take prescription Vitamin D @50 ,000 IU every week and will follow-up for routine testing of Vitamin D, at least 2-3 times per year to avoid over-replacement. Check labs today.  - VITAMIN D 25 Hydroxy (Vit-D Deficiency, Fractures)  4. Other specified hypothyroidism Patient with long-standing hypothyroidism, on levothyroxine therapy. She appears euthyroid. Orders and follow up as documented in patient record. Check labs today.  Counseling . Good thyroid control is important for overall health. Supratherapeutic thyroid levels are dangerous and will not improve weight loss results. . The correct way to take levothyroxine is fasting, with water, separated by at least 30 minutes from breakfast, and separated by more than 4 hours from calcium, iron, multivitamins, acid reflux medications (PPIs).   - TSH  5. At risk for diabetes mellitus Madison Palmer was given approximately 15 minutes of diabetes education and counseling today. We discussed intensive lifestyle modifications today with an emphasis on weight loss as well as increasing exercise and decreasing simple carbohydrates in her diet. We also reviewed medication options with an emphasis on risk versus benefit of those discussed.   Repetitive spaced learning was employed today to elicit superior memory formation and behavioral change.  6. Class 3 severe obesity  with serious comorbidity and body mass index (BMI) of 50.0 to 59.9 in adult, unspecified obesity type (HCC) Madison Palmer is currently in the action stage of change. As such, her goal is to continue with weight loss efforts. She has agreed to keeping a food journal and  adhering to recommended goals of 1800 calories and 100+ g protein.   Pt wants to get back on trying to eat breakfast at home.  Exercise goals: No exercise has been prescribed at this time.  Behavioral modification strategies: increasing lean protein intake, meal planning and cooking strategies, keeping healthy foods in the home and planning for success.  Madison Palmer has agreed to follow-up with our clinic in 2 weeks. She was informed of the importance of frequent follow-up visits to maximize her success with intensive lifestyle modifications for her multiple health conditions.   Madison Palmer was informed we would discuss her lab results at her next visit unless there is a critical issue that needs to be addressed sooner. Madison Palmer agreed to keep her next visit at the agreed upon time to discuss these results.  Objective:   Blood pressure (!) 141/92, pulse 71, temperature 98.2 F (36.8 C), temperature source Oral, height 5\' 3"  (1.6 m), weight (!) 323 lb (146.5 kg), last menstrual period 05/02/2017, SpO2 99 %. Body mass index is 57.22 kg/m.  General: Cooperative, alert, well developed, in no acute distress. HEENT: Conjunctivae and lids unremarkable. Cardiovascular: Regular rhythm.  Lungs: Normal work of breathing. Neurologic: No focal deficits.   Lab Results  Component Value Date   CREATININE 1.01 (H) 06/01/2020   BUN 18 06/01/2020   NA 145 (H) 06/01/2020   K 4.3 06/01/2020   CL 103 06/01/2020   CO2 29 06/01/2020   Lab Results  Component Value Date   ALT 11 06/01/2020   AST 11 06/01/2020   ALKPHOS 82 06/01/2020   BILITOT 0.4 06/01/2020   Lab Results  Component Value Date   HGBA1C 5.6 06/01/2020   HGBA1C 5.7 (H) 12/11/2019   HGBA1C 5.7 (H) 12/24/2018   HGBA1C 6.0 (H) 02/27/2018   HGBA1C 5.7 09/10/2017   Lab Results  Component Value Date   INSULIN 18.9 06/01/2020   INSULIN 11.4 12/11/2019   INSULIN 21.1 12/30/2018   INSULIN 9.6 02/27/2018   Lab Results  Component Value Date    TSH 2.430 06/01/2020   Lab Results  Component Value Date   CHOL 192 06/01/2020   HDL 48 06/01/2020   LDLCALC 123 (H) 06/01/2020   TRIG 116 06/01/2020   CHOLHDL 5.6 (H) 12/24/2018   Lab Results  Component Value Date   WBC 5.4 09/25/2018   HGB 12.7 09/25/2018   HCT 40.4 09/25/2018   MCV 94.6 09/25/2018   PLT 229 09/25/2018   Lab Results  Component Value Date   IRON 44 02/20/2018   TIBC 347 05/29/2017    Attestation Statements:   Reviewed by clinician on day of visit: allergies, medications, problem list, medical history, surgical history, family history, social history, and previous encounter notes.  Coral Ceo, am acting as transcriptionist for Coralie Common, MD.   I have reviewed the above documentation for accuracy and completeness, and I agree with the above. - Jinny Blossom, MD

## 2020-06-11 LAB — LIPID PANEL WITH LDL/HDL RATIO
Cholesterol, Total: 216 mg/dL — ABNORMAL HIGH (ref 100–199)
HDL: 50 mg/dL (ref >39)
LDL Chol Calc (NIH): 138 mg/dL — ABNORMAL HIGH (ref 0–99)
LDL/HDL Ratio: 2.8 ratio (ref 0.0–3.2)
Triglycerides: 154 mg/dL — ABNORMAL HIGH (ref 0–149)
VLDL Cholesterol Cal: 28 mg/dL (ref 5–40)

## 2020-06-11 LAB — COMPREHENSIVE METABOLIC PANEL
ALT: 11 IU/L (ref 0–32)
AST: 12 IU/L (ref 0–40)
Albumin/Globulin Ratio: 1.6 (ref 1.2–2.2)
Albumin: 4.3 g/dL (ref 3.8–4.9)
Alkaline Phosphatase: 90 IU/L (ref 44–121)
BUN/Creatinine Ratio: 23 (ref 9–23)
BUN: 23 mg/dL (ref 6–24)
Bilirubin Total: 0.3 mg/dL (ref 0.0–1.2)
CO2: 25 mmol/L (ref 20–29)
Calcium: 10.7 mg/dL — ABNORMAL HIGH (ref 8.7–10.2)
Chloride: 102 mmol/L (ref 96–106)
Creatinine, Ser: 1.01 mg/dL — ABNORMAL HIGH (ref 0.57–1.00)
GFR calc Af Amer: 73 mL/min/{1.73_m2} (ref >59)
GFR calc non Af Amer: 63 mL/min/{1.73_m2} (ref >59)
Globulin, Total: 2.7 g/dL (ref 1.5–4.5)
Glucose: 111 mg/dL — ABNORMAL HIGH (ref 65–99)
Potassium: 4 mmol/L (ref 3.5–5.2)
Sodium: 144 mmol/L (ref 134–144)
Total Protein: 7 g/dL (ref 6.0–8.5)

## 2020-06-15 ENCOUNTER — Ambulatory Visit (INDEPENDENT_AMBULATORY_CARE_PROVIDER_SITE_OTHER): Payer: BC Managed Care – PPO | Admitting: Family Medicine

## 2020-06-15 ENCOUNTER — Other Ambulatory Visit: Payer: Self-pay

## 2020-06-15 ENCOUNTER — Encounter (INDEPENDENT_AMBULATORY_CARE_PROVIDER_SITE_OTHER): Payer: Self-pay | Admitting: Family Medicine

## 2020-06-15 VITALS — BP 151/86 | HR 72 | Temp 97.7°F | Ht 63.0 in | Wt 321.0 lb

## 2020-06-15 DIAGNOSIS — E038 Other specified hypothyroidism: Secondary | ICD-10-CM | POA: Diagnosis not present

## 2020-06-15 DIAGNOSIS — E559 Vitamin D deficiency, unspecified: Secondary | ICD-10-CM | POA: Diagnosis not present

## 2020-06-15 DIAGNOSIS — R7303 Prediabetes: Secondary | ICD-10-CM | POA: Diagnosis not present

## 2020-06-15 DIAGNOSIS — M25511 Pain in right shoulder: Secondary | ICD-10-CM

## 2020-06-15 DIAGNOSIS — Z9189 Other specified personal risk factors, not elsewhere classified: Secondary | ICD-10-CM

## 2020-06-15 DIAGNOSIS — Z6841 Body Mass Index (BMI) 40.0 and over, adult: Secondary | ICD-10-CM

## 2020-06-15 MED ORDER — METFORMIN HCL 500 MG PO TABS: 500.0000 mg | ORAL_TABLET | Freq: Every day | ORAL | 0 refills | Status: DC

## 2020-06-15 MED ORDER — LEVOTHYROXINE SODIUM 75 MCG PO TABS: 75.0000 ug | ORAL_TABLET | Freq: Every day | ORAL | 0 refills | Status: DC

## 2020-06-22 NOTE — Progress Notes (Unsigned)
Chief Complaint:   OBESITY Madison Palmer is here to discuss her progress with her obesity treatment plan along with follow-up of her obesity related diagnoses. Madison Palmer is on keeping a food journal and adhering to recommended goals of 1800 calories and 80-100 protein and states she is following her eating plan approximately 100% of the time. Madison Palmer states she is doing 0 minutes 0 times per week.  Today's visit was #: 30 Starting weight: 357 lbs Starting date: 02/27/2018 Today's weight: 321 lbs Today's date: 06/15/2020 Total lbs lost to date: 36 lbs Total lbs lost since last in-office visit: 2 lbs  Interim History: Madison Palmer has worked on making changes in getting more on track. She is eating breakfast 3 times a week at home and cooking more at home. Her calories are ending up around 1800. Her endocrinologist is concerned about continued elevation of calcium. She has been trying to be more mindful of cooking at home to control calories.  Subjective:   1. Pre-diabetes Madison Palmer's last A1c was 5.6 with an insulin level of 18.9. She is on Metformin 500 mg daily with no GI side effects.  Lab Results  Component Value Date   HGBA1C 5.6 06/01/2020   Lab Results  Component Value Date   INSULIN 18.9 06/01/2020   INSULIN 11.4 12/11/2019   INSULIN 21.1 12/30/2018   INSULIN 9.6 02/27/2018    2. Vitamin D deficiency Pt denies nausea, vomiting, and muscle weakness but notes fatigue. Pt was previously on prescription Vit D 50,000 IU weekly. She has a Vit d level of 19.7  3. Other specified hypothyroidism Madison Palmer denies cold/heat intolerances or palpitations. She is on levothyroxine. Her last TSH was 2.430.  Lab Results  Component Value Date   TSH 2.430 06/01/2020    4. Acute pain of right shoulder Madison Palmer reports right shoulder pain causing tingling and loss of sensation. She is interested in seeing a chiropractor.  5. At risk for diabetes mellitus Madison Palmer is at higher than average risk for  developing diabetes due to obesity.   Assessment/Plan:   1. Pre-diabetes Madison Palmer will continue to work on weight loss, exercise, and decreasing simple carbohydrates to help decrease the risk of diabetes.   - metFORMIN (GLUCOPHAGE) 500 MG tablet; Take 1 tablet (500 mg total) by mouth daily with breakfast.  Dispense: 90 tablet; Refill: 0  2. Vitamin D deficiency Low Vitamin D level contributes to fatigue and are associated with obesity, breast, and colon cancer. She agrees to take Vitamin D @5 ,000 IU daily and will follow-up for routine testing of Vitamin D, at least 2-3 times per year to avoid over-replacement.  3. Other specified hypothyroidism Patient with long-standing hypothyroidism, on levothyroxine therapy. She appears euthyroid. Orders and follow up as documented in patient record.  Counseling . Good thyroid control is important for overall health. Supratherapeutic thyroid levels are dangerous and will not improve weight loss results. . The correct way to take levothyroxine is fasting, with water, separated by at least 30 minutes from breakfast, and separated by more than 4 hours from calcium, iron, multivitamins, acid reflux medications (PPIs).   - levothyroxine (SYNTHROID) 75 MCG tablet; Take 1 tablet (75 mcg total) by mouth daily.  Dispense: 90 tablet; Refill: 0  4. Acute pain of right shoulder Information provided for Dr. Charlann Boxer for [facility].  5. At risk for diabetes mellitus Madison Palmer was given approximately 15 minutes of diabetes education and counseling today. We discussed intensive lifestyle modifications today with an emphasis on weight loss  as well as increasing exercise and decreasing simple carbohydrates in her diet. We also reviewed medication options with an emphasis on risk versus benefit of those discussed.   Repetitive spaced learning was employed today to elicit superior memory formation and behavioral change.  6. Class 3 severe obesity with serious comorbidity and  body mass index (BMI) of 50.0 to 59.9 in adult, unspecified obesity type (HCC) Madison Palmer is currently in the action stage of change. As such, her goal is to continue with weight loss efforts. She has agreed to keeping a food journal and adhering to recommended goals of 1500-1650 calories and 95+ protein.   Exercise goals: As is  Behavioral modification strategies: increasing lean protein intake, meal planning and cooking strategies, keeping healthy foods in the home and keeping a strict food journal.  Madison Palmer has agreed to follow-up with our clinic in 2 weeks. She was informed of the importance of frequent follow-up visits to maximize her success with intensive lifestyle modifications for her multiple health conditions.   Objective:   Blood pressure (!) 151/86, pulse 72, temperature 97.7 F (36.5 C), temperature source Oral, height 5\' 3"  (1.6 m), weight (!) 321 lb (145.6 kg), last menstrual period 05/02/2017, SpO2 97 %. Body mass index is 56.86 kg/m.  General: Cooperative, alert, well developed, in no acute distress. HEENT: Conjunctivae and lids unremarkable. Cardiovascular: Regular rhythm.  Lungs: Normal work of breathing. Neurologic: No focal deficits.   Lab Results  Component Value Date   CREATININE 1.01 (H) 06/10/2020   BUN 23 06/10/2020   NA 144 06/10/2020   K 4.0 06/10/2020   CL 102 06/10/2020   CO2 25 06/10/2020   Lab Results  Component Value Date   ALT 11 06/10/2020   AST 12 06/10/2020   ALKPHOS 90 06/10/2020   BILITOT 0.3 06/10/2020   Lab Results  Component Value Date   HGBA1C 5.6 06/01/2020   HGBA1C 5.7 (H) 12/11/2019   HGBA1C 5.7 (H) 12/24/2018   HGBA1C 6.0 (H) 02/27/2018   HGBA1C 5.7 09/10/2017   Lab Results  Component Value Date   INSULIN 18.9 06/01/2020   INSULIN 11.4 12/11/2019   INSULIN 21.1 12/30/2018   INSULIN 9.6 02/27/2018   Lab Results  Component Value Date   TSH 2.430 06/01/2020   Lab Results  Component Value Date   CHOL 216 (H) 06/10/2020    HDL 50 06/10/2020   LDLCALC 138 (H) 06/10/2020   TRIG 154 (H) 06/10/2020   CHOLHDL 5.6 (H) 12/24/2018   Lab Results  Component Value Date   WBC 5.4 09/25/2018   HGB 12.7 09/25/2018   HCT 40.4 09/25/2018   MCV 94.6 09/25/2018   PLT 229 09/25/2018   Lab Results  Component Value Date   IRON 44 02/20/2018   TIBC 347 05/29/2017    Attestation Statements:   Reviewed by clinician on day of visit: allergies, medications, problem list, medical history, surgical history, family history, social history, and previous encounter notes.  Coral Ceo, am acting as transcriptionist for Coralie Common, MD.   I have reviewed the above documentation for accuracy and completeness, and I agree with the above. - Jinny Blossom, MD

## 2020-07-07 ENCOUNTER — Other Ambulatory Visit: Payer: Self-pay

## 2020-07-07 ENCOUNTER — Telehealth: Payer: Self-pay | Admitting: Cardiology

## 2020-07-07 DIAGNOSIS — I152 Hypertension secondary to endocrine disorders: Secondary | ICD-10-CM

## 2020-07-07 MED ORDER — LISINOPRIL 40 MG PO TABS: 40.0000 mg | ORAL_TABLET | Freq: Every day | ORAL | 3 refills | Status: DC

## 2020-07-07 NOTE — Telephone Encounter (Signed)
*  STAT* If patient is at the pharmacy, call can be transferred to refill team.   1. Which medications need to be refilled? (please list name of each medication and dose if known)  lisinopril (ZESTRIL) 40 MG tablet  2. Which pharmacy/location (including street and city if local pharmacy) is medication to be sent to? [id number] - [place], [place] - [address] AT [address]   3. Do they need a 30 day or 90 day supply? [place]

## 2020-07-08 ENCOUNTER — Telehealth (INDEPENDENT_AMBULATORY_CARE_PROVIDER_SITE_OTHER): Payer: BC Managed Care – PPO | Admitting: Psychiatry

## 2020-07-08 ENCOUNTER — Other Ambulatory Visit: Payer: Self-pay

## 2020-07-08 ENCOUNTER — Encounter (HOSPITAL_COMMUNITY): Payer: Self-pay | Admitting: Psychiatry

## 2020-07-08 DIAGNOSIS — F419 Anxiety disorder, unspecified: Secondary | ICD-10-CM | POA: Diagnosis not present

## 2020-07-08 DIAGNOSIS — F319 Bipolar disorder, unspecified: Secondary | ICD-10-CM | POA: Diagnosis not present

## 2020-07-08 MED ORDER — LURASIDONE HCL 60 MG PO TABS: 60.0000 mg | ORAL_TABLET | Freq: Every morning | ORAL | 2 refills | Status: DC

## 2020-07-08 MED ORDER — DIVALPROEX SODIUM 500 MG PO DR TAB: 500.0000 mg | DELAYED_RELEASE_TABLET | Freq: Every day | ORAL | 2 refills | Status: DC

## 2020-07-08 NOTE — Progress Notes (Signed)
Virtual Visit via Telephone Note  I connected with [name] on 07/08/20 at 10:20 AM EST by telephone and verified that I am speaking with the correct person using two identifiers.  Location: Patient: Home Provider: Home Office   I discussed the limitations, risks, security and privacy concerns of performing an evaluation and management service by telephone and the availability of in person appointments. I also discussed with the patient that there may be a patient responsible charge related to this service. The patient expressed understanding and agreed to proceed.   History of Present Illness: Patient is evaluated by phone session.  She admitted weight gain in recent months because of the holidays.  However she is back on diet and lost 3 pounds since 2 weeks.  She is in a weight loss program.  She is sleeping better since taking the Depakote at bedtime.  She denies any impulsive behavior, paranoia or any hallucination.  She feels the current medicine is working and keeping her anxiety depression and mood swings stable.  She lives with her son who is now 33 year old.  She denies drinking or using illegal substances.  She has no tremors shakes or any EPS.  Recently she had blood work and her cholesterol, LDL are high.  She is working on her diet.  Past psychiatric history; H/Odepression since early age. [facility] delusion, psychosis and violent behavior. D/C on Depakote 500 mg BID, Latuda 60 mg daily and Trilafon 12 mg a day. No h/osuicidalattempt,nightmares, panic attack or OCD symptoms.No h/odrinking and drug use.  Recent Results (from the past 2160 hour(s))  Comprehensive metabolic panel     Status: Abnormal   Collection Time: 06/01/20  1:06 PM  Result Value Ref Range   Glucose 97 65 - 99 mg/dL   BUN 18 6 - 24 mg/dL   Creatinine, Ser 1.01 (H) 0.57 - 1.00 mg/dL   GFR calc non Af Amer 63 >59 mL/min/1.73   GFR calc Af Amer  73 >59 mL/min/1.73    Comment: **In accordance with recommendations from the NKF-ASN Task force,**   [facility] is in the process of updating its eGFR calculation to the   2021 CKD-EPI creatinine equation that estimates kidney function   without a race variable.    BUN/Creatinine Ratio 18 9 - 23   Sodium 145 (H) 134 - 144 mmol/L   Potassium 4.3 3.5 - 5.2 mmol/L   Chloride 103 96 - 106 mmol/L   CO2 29 20 - 29 mmol/L   Calcium 10.3 (H) 8.7 - 10.2 mg/dL   Total Protein 6.7 6.0 - 8.5 g/dL   Albumin 4.1 3.8 - 4.9 g/dL   Globulin, Total 2.6 1.5 - 4.5 g/dL   Albumin/Globulin Ratio 1.6 1.2 - 2.2   Bilirubin Total 0.4 0.0 - 1.2 mg/dL   Alkaline Phosphatase 82 44 - 121 IU/L   AST 11 0 - 40 IU/L   ALT 11 0 - 32 IU/L  Hemoglobin A1c     Status: None   Collection Time: 06/01/20  1:06 PM  Result Value Ref Range   Hgb A1c MFr Bld 5.6 4.8 - 5.6 %    Comment:          Prediabetes: 5.7 - 6.4          Diabetes: >6.4          Glycemic control for adults with diabetes: <7.0    Est. average glucose Bld gHb Est-mCnc 114 mg/dL  Insulin, random     Status: None   Collection Time: 06/01/20  1:06 PM  Result Value Ref Range   INSULIN 18.9 2.6 - 24.9 uIU/mL  Lipid Panel With LDL/HDL Ratio     Status: Abnormal   Collection Time: 06/01/20  1:06 PM  Result Value Ref Range   Cholesterol, Total 192 100 - 199 mg/dL   Triglycerides 116 0 - 149 mg/dL   HDL 48 >39 mg/dL   VLDL Cholesterol Cal 21 5 - 40 mg/dL   LDL Chol Calc (NIH) 123 (H) 0 - 99 mg/dL   LDL/HDL Ratio 2.6 0.0 - 3.2 ratio    Comment:                                     LDL/HDL Ratio                                             Men  Women                               1/2 Avg.Risk  1.0    1.5                                   Avg.Risk  3.6    3.2                                2X Avg.Risk  6.2    5.0                                3X Avg.Risk  8.0    6.1   VITAMIN D 25 Hydroxy (Vit-D Deficiency, Fractures)     Status: Abnormal   Collection Time:  06/01/20  1:06 PM  Result Value Ref Range   Vit D, 25-Hydroxy 19.7 (L) 30.0 - 100.0 ng/mL    Comment: Vitamin D deficiency has been defined by the [place] practice guideline as a level of serum 25-OH vitamin D less than 20 ng/mL (1,2). The Endocrine Society went on to further define vitamin D insufficiency as a level between 21 and 29 ng/mL (2). 1. IOM (Institute of Medicine). 2010. Dietary reference    intakes for calcium and D. [place]: The    [employer]. 2. Holick MF, Binkley Lofall, Bischoff-Ferrari HA, et al.    Evaluation, treatment, and prevention of vitamin D    deficiency: an Endocrine Society clinical practice    guideline. JCEM. 2011 Jul; 96(7):1911-30.   TSH     Status: None   Collection Time: 06/01/20  1:06 PM  Result Value Ref Range   TSH 2.430 0.450 - 4.500 uIU/mL  Comprehensive metabolic panel     Status: Abnormal   Collection Time: 06/10/20  2:41 PM  Result Value Ref Range   Glucose 111 (H) 65 - 99 mg/dL   BUN 23 6 - 24 mg/dL   Creatinine, Ser 1.01 (H) 0.57 - 1.00 mg/dL   GFR calc non Af Amer 63 >59 mL/min/1.73  GFR calc Af Amer 73 >59 mL/min/1.73    Comment: **In accordance with recommendations from the NKF-ASN Task force,**   [facility] is in the process of updating its eGFR calculation to the   2021 CKD-EPI creatinine equation that estimates kidney function   without a race variable.    BUN/Creatinine Ratio 23 9 - 23   Sodium 144 134 - 144 mmol/L   Potassium 4.0 3.5 - 5.2 mmol/L   Chloride 102 96 - 106 mmol/L   CO2 25 20 - 29 mmol/L   Calcium 10.7 (H) 8.7 - 10.2 mg/dL   Total Protein 7.0 6.0 - 8.5 g/dL   Albumin 4.3 3.8 - 4.9 g/dL   Globulin, Total 2.7 1.5 - 4.5 g/dL   Albumin/Globulin Ratio 1.6 1.2 - 2.2   Bilirubin Total 0.3 0.0 - 1.2 mg/dL   Alkaline Phosphatase 90 44 - 121 IU/L   AST 12 0 - 40 IU/L   ALT 11 0 - 32 IU/L  Lipid Panel With LDL/HDL Ratio     Status: Abnormal   Collection Time:  06/10/20  2:41 PM  Result Value Ref Range   Cholesterol, Total 216 (H) 100 - 199 mg/dL   Triglycerides 154 (H) 0 - 149 mg/dL   HDL 50 >39 mg/dL   VLDL Cholesterol Cal 28 5 - 40 mg/dL   LDL Chol Calc (NIH) 138 (H) 0 - 99 mg/dL   LDL/HDL Ratio 2.8 0.0 - 3.2 ratio    Comment:                                     LDL/HDL Ratio                                             Men  Women                               1/2 Avg.Risk  1.0    1.5                                   Avg.Risk  3.6    3.2                                2X Avg.Risk  6.2    5.0                                3X Avg.Risk  8.0    6.1       Psychiatric Specialty Exam: Physical Exam  Review of Systems  Weight (!) 318 lb (144.2 kg), last menstrual period 05/02/2017.Body mass index is 56.33 kg/m.  General Appearance: NA  Eye Contact:  NA  Speech:  Normal Rate  Volume:  Normal  Mood:  Euthymic  Affect:  NA  Thought Process:  Goal Directed  Orientation:  Full (Time, Place, and Person)  Thought Content:  WDL  Suicidal Thoughts:  No  Homicidal Thoughts:  No  Memory:  Immediate;   Fair Recent;   Fair Remote;   Good  Judgement:  Intact  Insight:  Present  Psychomotor Activity:  NA  Concentration:  Concentration: Fair and Attention Span: Fair  Recall:  [employer] of Knowledge:  Good  Language:  Good  Akathisia:  No  Handed:  Right  AIMS (if indicated):     Assets:  Communication Skills Desire for Improvement Housing Social Support  ADL's:  Intact  Cognition:  WNL  Sleep:   ok      Assessment and Plan: Bipolar disorder type I.  Anxiety.  Patient sleeping better with the Depakote taking at bedtime.  She admitted weight gain during the holidays but now back on her diet plan.  I did review blood work results with her.  He is hoping next blood work will show improvement as she already lost few pounds in 2 weeks.  She does not want to change the medication.  I will continue Depakote 500 mg at bedtime and Latuda 60  mg in the morning.  Encouraged healthy lifestyle and watch her calorie intake.  Recommended to call us back if there is any question or any concern.  Follow-up in 3 months.  Follow Up Instructions:    I discussed the assessment and treatment plan with the patient. The patient was provided an opportunity to ask questions and all were answered. The patient agreed with the plan and demonstrated an understanding of the instructions.   The patient was advised to call back or seek an in-person evaluation if the symptoms worsen or if the condition fails to improve as anticipated.  I provided 17 minutes of non-face-to-face time during this encounter.   Kathlee Nations, MD

## 2020-07-13 ENCOUNTER — Telehealth (INDEPENDENT_AMBULATORY_CARE_PROVIDER_SITE_OTHER): Payer: BC Managed Care – PPO | Admitting: Family Medicine

## 2020-07-13 ENCOUNTER — Encounter (INDEPENDENT_AMBULATORY_CARE_PROVIDER_SITE_OTHER): Payer: Self-pay | Admitting: Family Medicine

## 2020-07-13 ENCOUNTER — Other Ambulatory Visit: Payer: Self-pay

## 2020-07-13 DIAGNOSIS — E559 Vitamin D deficiency, unspecified: Secondary | ICD-10-CM | POA: Diagnosis not present

## 2020-07-13 DIAGNOSIS — Z6841 Body Mass Index (BMI) 40.0 and over, adult: Secondary | ICD-10-CM | POA: Diagnosis not present

## 2020-07-15 ENCOUNTER — Ambulatory Visit: Payer: BC Managed Care – PPO | Admitting: Family Medicine

## 2020-07-19 ENCOUNTER — Other Ambulatory Visit (HOSPITAL_COMMUNITY): Payer: Self-pay | Admitting: Psychiatry

## 2020-07-19 DIAGNOSIS — F419 Anxiety disorder, unspecified: Secondary | ICD-10-CM

## 2020-07-19 DIAGNOSIS — F319 Bipolar disorder, unspecified: Secondary | ICD-10-CM

## 2020-07-19 NOTE — Progress Notes (Signed)
TeleHealth Visit:  Due to the COVID-19 pandemic, this visit was completed with telemedicine (audio/video) technology to reduce patient and provider exposure as well as to preserve personal protective equipment.   [name] has verbally consented to this TeleHealth visit. The patient is located at home, the provider is located at the [facility] office. The participants in this visit include the listed provider and patient. The visit was conducted today via video.   Chief Complaint: OBESITY Madison Palmer is here to discuss her progress with her obesity treatment plan along with follow-up of her obesity related diagnoses. [name] is on keeping a food journal and adhering to recommended goals of 1650-1800 calories and 95 g protein and states she is following her eating plan approximately 70% of the time. [name] states she is walking.  Today's visit was #: 31 Starting weight: 357 lbs Starting date: 02/27/2018  Interim History: [name] has been eating more at home. She is trying to get the 1650 calories and increase to 95 g of protein. She has gotten to 1650 calories more days than not. She is often between 1650-1800 calories. Her biggest obstacle is sweets and staying around 1600-1700 calories.  Subjective:   1. Vitamin D deficiency Lempi's last Vit D level was 19.7 on 06/01/2020. She reports fatigue.   Ref. Range 06/01/2020 13:06  Vitamin D, 25-Hydroxy Latest Ref Range: 30.0 - 100.0 ng/mL 19.7 (L)   Assessment/Plan:   1. Vitamin D deficiency Low Vitamin D level contributes to fatigue and are associated with obesity, breast, and colon cancer. She agrees to continue to take prescription Vitamin D @50 ,000 IU every week and will follow-up for routine testing of Vitamin D, at least 2-3 times per year to avoid over-replacement. No refill needed at this time.  2. Class 3 severe obesity with serious comorbidity and body mass index (BMI) of 60.0 to 69.9 in adult, unspecified obesity type  (HCC) [name] is currently in the action stage of change. As such, her goal is to continue with weight loss efforts. She has agreed to keeping a food journal and adhering to recommended goals of 1650-1800 calories and 95+ g protein.   Exercise goals: As is  Behavioral modification strategies: increasing lean protein intake, meal planning and cooking strategies, keeping healthy foods in the home and planning for success.  [name] has agreed to follow-up with our clinic in 3 weeks. She was informed of the importance of frequent follow-up visits to maximize her success with intensive lifestyle modifications for her multiple health conditions.  Objective:   VITALS: Per patient if applicable, see vitals. GENERAL: Alert and in no acute distress. CARDIOPULMONARY: No increased WOB. Speaking in clear sentences.  PSYCH: Pleasant and cooperative. Speech normal rate and rhythm. Affect is appropriate. Insight and judgement are appropriate. Attention is focused, linear, and appropriate.  NEURO: Oriented as arrived to appointment on time with no prompting.   Lab Results  Component Value Date   CREATININE 1.01 (H) 06/10/2020   BUN 23 06/10/2020   NA 144 06/10/2020   K 4.0 06/10/2020   CL 102 06/10/2020   CO2 25 06/10/2020   Lab Results  Component Value Date   ALT 11 06/10/2020   AST 12 06/10/2020   ALKPHOS 90 06/10/2020   BILITOT 0.3 06/10/2020   Lab Results  Component Value Date   HGBA1C 5.6 06/01/2020   HGBA1C 5.7 (H) 12/11/2019   HGBA1C 5.7 (H) 12/24/2018   HGBA1C 6.0 (H) 02/27/2018   HGBA1C 5.7 09/10/2017  Lab Results  Component Value Date   INSULIN 18.9 06/01/2020   INSULIN 11.4 12/11/2019   INSULIN 21.1 12/30/2018   INSULIN 9.6 02/27/2018   Lab Results  Component Value Date   TSH 2.430 06/01/2020   Lab Results  Component Value Date   CHOL 216 (H) 06/10/2020   HDL 50 06/10/2020   LDLCALC 138 (H) 06/10/2020   TRIG 154 (H) 06/10/2020   CHOLHDL 5.6 (H) 12/24/2018   Lab  Results  Component Value Date   WBC 5.4 09/25/2018   HGB 12.7 09/25/2018   HCT 40.4 09/25/2018   MCV 94.6 09/25/2018   PLT 229 09/25/2018   Lab Results  Component Value Date   IRON 44 02/20/2018   TIBC 347 05/29/2017    Attestation Statements:   Reviewed by clinician on day of visit: allergies, medications, problem list, medical history, surgical history, family history, social history, and previous encounter notes.  Time spent on visit including pre-visit chart review and post-visit charting and care was 16 minutes.   Coral Ceo, am acting as transcriptionist for Coralie Common, MD.   I have reviewed the above documentation for accuracy and completeness, and I agree with the above. - Jinny Blossom, MD

## 2020-07-26 ENCOUNTER — Ambulatory Visit (INDEPENDENT_AMBULATORY_CARE_PROVIDER_SITE_OTHER): Payer: BC Managed Care – PPO | Admitting: Family Medicine

## 2020-07-26 ENCOUNTER — Encounter (INDEPENDENT_AMBULATORY_CARE_PROVIDER_SITE_OTHER): Payer: Self-pay | Admitting: Family Medicine

## 2020-07-26 ENCOUNTER — Other Ambulatory Visit: Payer: Self-pay

## 2020-07-26 VITALS — BP 149/85 | HR 78 | Temp 98.2°F | Ht 63.0 in | Wt 321.0 lb

## 2020-07-26 DIAGNOSIS — I10 Essential (primary) hypertension: Secondary | ICD-10-CM

## 2020-07-26 DIAGNOSIS — E213 Hyperparathyroidism, unspecified: Secondary | ICD-10-CM

## 2020-07-26 DIAGNOSIS — Z6841 Body Mass Index (BMI) 40.0 and over, adult: Secondary | ICD-10-CM | POA: Diagnosis not present

## 2020-07-27 NOTE — Progress Notes (Signed)
[facility] [address] [place] Phone: [phone] Subjective:   I Madison Palmer am serving as a Education administrator for Dr. Hulan Saas.  This visit occurred during the SARS-CoV-2 public health emergency.  Safety protocols were in place, including screening questions prior to the visit, additional usage of staff PPE, and extensive cleaning of exam room while observing appropriate contact time as indicated for disinfecting solutions.   I'm seeing this patient by the request  of:  [name], Charlene Brooke, NP  CC: Right shoulder, low back pain  [id number]  Madison Palmer is a 55 y.o. female coming in with complaint of R shoulder pain. Patient states keeps her up at night depending on how she sleeps. Low back pain catches her with certain ROM. States there is numbness and tingling that radiates down her arm.   Onset- Chronic  Location - Posterior pain and lower back pain Duration- Consistent pain all day Severity- 6/10 at its worse      Past Medical History:  Diagnosis Date  . Anemia   . Anxiety   . Arrhythmia    tachycardia  . Arthritis   . Chickenpox   . Depression   . Diverticulitis   . GERD (gastroesophageal reflux disease)   . Glaucoma   . Hyperlipidemia   . Hyperparathyroidism ([place])   . Hypertension   . Inflammatory polyps of colon ([place])   . LVH (left ventricular hypertrophy)   . Lymphedema   . PCOS (polycystic ovarian syndrome)   . Prediabetes   . Recurrent UTI   . Sleep apnea    CPAP  . Thyroid disease   . Vitamin D deficiency    Past Surgical History:  Procedure Laterality Date  . BREAST BIOPSY  2015  . CESAREAN SECTION  2004  . INNER EAR SURGERY     ear and sinus surgery  . LAPAROSCOPIC REPAIR AND REMOVAL OF GASTRIC BAND    . OOPHORECTOMY Left   . TONSILLECTOMY AND ADENOIDECTOMY     Social History   Socioeconomic History  . Marital status: Married    Spouse name: Not on file  . Number of children: 1  .  Years of education: Not on file  . Highest education level: Not on file  Occupational History  . Occupation: Unemployed  Tobacco Use  . Smoking status: Never Smoker  . Smokeless tobacco: Never Used  Vaping Use  . Vaping Use: Never used  Substance and Sexual Activity  . Alcohol use: Yes    Comment: social  . Drug use: No  . Sexual activity: Not Currently  Other Topics Concern  . Not on file  Social History Narrative   Lives with son and companion.     Social Determinants of Health   Financial Resource Strain: Not on file  Food Insecurity: Not on file  Transportation Needs: Not on file  Physical Activity: Not on file  Stress: Not on file  Social Connections: Not on file   Allergies  Allergen Reactions  . Other     Other reaction(s): Other (See Comments) Instructed not to take by Cardiology.  . Fentanyl Hives  . Levofloxacin Hives  . Midazolam Hives  . Pollen Extract     seasonal  . Atorvastatin     Muscle pain in legs   . Doxycycline Itching and Swelling  . Hydralazine Hcl Other (See Comments)    Hypercalcemia   . Rosuvastatin     Abdominal pain   Family  History  Adopted: Yes  Problem Relation Age of Onset  . Hearing loss Son        right   . Healthy Son     Current Outpatient Medications (Endocrine & Metabolic):  .  levothyroxine (SYNTHROID) 75 MCG tablet, Take 1 tablet (75 mcg total) by mouth daily. .  metFORMIN (GLUCOPHAGE) 500 MG tablet, Take 1 tablet (500 mg total) by mouth daily with breakfast.  Current Outpatient Medications (Cardiovascular):  .  amLODipine (NORVASC) 5 MG tablet, Take 5 mg by mouth daily. .  cloNIDine (CATAPRES) 0.1 MG tablet, 2 (two) times daily.  .  fenofibrate (TRICOR) 145 MG tablet, Take 1 tablet (145 mg total) by mouth daily. .  furosemide (LASIX) 40 MG tablet, TAKE 1 TABLET(40 MG) BY MOUTH DAILY .  labetalol (NORMODYNE) 300 MG tablet, TAKE 2 TABLETS(600 MG) BY MOUTH TWICE DAILY (Patient taking differently: Takes 1 tablet twice  a day.) .  lisinopril (ZESTRIL) 40 MG tablet, Take 1 tablet (40 mg total) by mouth daily.  Current Outpatient Medications (Respiratory):  .  cetirizine (ZYRTEC ALLERGY) 10 MG tablet, Take 1 tablet (10 mg total) by mouth daily. .  fluticasone (FLONASE) 50 MCG/ACT nasal spray, Place 2 sprays into both nostrils daily.  Current Outpatient Medications (Analgesics):  .  acetaminophen (TYLENOL) 500 MG tablet, Take 1,000 mg by mouth 3 (three) times daily as needed for moderate pain or headache.   Current Outpatient Medications (Other):  .  divalproex (DEPAKOTE) 500 MG DR tablet, Take 1 tablet (500 mg total) by mouth at bedtime. Marland Kitchen  esomeprazole (NEXIUM) 40 MG capsule, TAKE ONE CAPSULE(40 MG TOTAL) BY MOUTH TWICE DAILY .  gabapentin (NEURONTIN) 100 MG capsule, Take 2 capsules (200 mg total) by mouth at bedtime. .  Lurasidone HCl 60 MG TABS, Take 1 tablet (60 mg total) by mouth in the morning. .  Potassium Chloride ER 20 MEQ TBCR, TAKE 2 TABLETS BY MOUTH DAILY (Patient taking differently: Take 1 tablet by mouth daily. 1 tablet once a day)   Reviewed prior external information including notes and imaging from  primary care provider As well as notes that were available from care everywhere and other healthcare systems.  Past medical history, social, surgical and family history all reviewed in electronic medical record.  No pertanent information unless stated regarding to the chief complaint.   Review of Systems:  No headache, visual changes, nausea, vomiting, diarrhea, constipation, dizziness, abdominal pain, skin rash, fevers, chills, night sweats, weight loss, swollen lymph nodes,  joint swelling, chest pain, shortness of breath, mood changes. POSITIVE muscle aches, body aches  Objective  Blood pressure (!) 160/90, pulse 85, height 5\' 3"  (1.6 m), weight (!) 324 lb (147 kg), last menstrual period 05/02/2017, SpO2 97 %.   General: No apparent distress alert and oriented x3 mood and affect normal,  dressed appropriately.  Morbidly obese HEENT: Pupils equal, extraocular movements intact  Respiratory: Patient's speak in full sentences and does not appear short of breath  Cardiovascular: 1+ lower extremity edema, non tender, no erythema  Gait antalgic walking with the aid of a cane MSK: Patient's neck exam does have significant loss of lordosis.  Mild positive Spurling's on the right side with mild radicular symptoms more in the C5 distribution.  Patient has fairly good range of motion of the shoulder with very mild positive impingement noted.  5 out of 5 strength of noted of the rotator cuff.  Positive impingement with Hawkins.  Good grip strength noted. Low back exam  does have significant loss of lordosis.  Difficult to inspect secondary to patient's body habitus.  Tightness noted of the hips secondary to body habitus.  Does have significant lower extremity edema.  Negative straight leg test.  Neurovascularly intact distally.  Tender to palpation mostly in the paraspinal musculature of L4 and L5.  [id number]; 15 additional minutes spent for Therapeutic exercises as stated in above notes.  This included exercises focusing on stretching, strengthening, with significant focus on eccentric aspects.   Long term goals include an improvement in range of motion, strength, endurance as well as avoiding reinjury. Patient's frequency would include in 1-2 times a day, 3-5 times a week for a duration of 6-12 weeks. Low back exercises that included:  Pelvic tilt/bracing instruction to focus on control of the pelvic girdle and lower abdominal muscles  Glute strengthening exercises, focusing on proper firing of the glutes without engaging the low back muscles Proper stretching techniques for maximum relief for the hamstrings, hip flexors, low back and some rotation where tolerated    Proper technique shown and discussed handout in great detail with ATC.  All questions were discussed and answered.     Impression and  Recommendations:     The above documentation has been reviewed and is accurate and complete Lyndal Pulley, DO

## 2020-07-28 ENCOUNTER — Other Ambulatory Visit: Payer: Self-pay

## 2020-07-28 ENCOUNTER — Ambulatory Visit (INDEPENDENT_AMBULATORY_CARE_PROVIDER_SITE_OTHER): Payer: BC Managed Care – PPO | Admitting: Family Medicine

## 2020-07-28 ENCOUNTER — Encounter: Payer: Self-pay | Admitting: Family Medicine

## 2020-07-28 ENCOUNTER — Ambulatory Visit (INDEPENDENT_AMBULATORY_CARE_PROVIDER_SITE_OTHER): Payer: BC Managed Care – PPO

## 2020-07-28 VITALS — BP 160/90 | HR 85 | Ht 63.0 in | Wt 324.0 lb

## 2020-07-28 DIAGNOSIS — G8929 Other chronic pain: Secondary | ICD-10-CM

## 2020-07-28 DIAGNOSIS — M545 Low back pain, unspecified: Secondary | ICD-10-CM

## 2020-07-28 DIAGNOSIS — M542 Cervicalgia: Secondary | ICD-10-CM

## 2020-07-28 DIAGNOSIS — M5136 Other intervertebral disc degeneration, lumbar region: Secondary | ICD-10-CM | POA: Diagnosis not present

## 2020-07-28 MED ORDER — GABAPENTIN 100 MG PO CAPS: 200.0000 mg | ORAL_CAPSULE | Freq: Every day | ORAL | 3 refills | Status: DC

## 2020-07-28 NOTE — Assessment & Plan Note (Signed)
Chronic with likely underlying arthritis patient has had difficulty with this previously.  Has been told that she has degenerative disc disease at multiple levels.  We will get x-rays today.  Start on a low-dose of gabapentin.  Warned of potential side effects.  We will hold on any type of manipulation at this point but then will consider it at follow-up.  Patient will be working with formal physical therapy which I think will be beneficial.

## 2020-07-28 NOTE — Assessment & Plan Note (Signed)
Patient has had this for quite some time.  Patient does have degenerative disc disease seen on CT scan but will get a dedicated x-ray.  Discussed with patient about the importance of weight loss.  Patient is trying to work on this and is working with healthy weight and wellness.  Discussed posture dynamics.  Patient will require formal physical therapy and I am hoping that this will make some difference.  We will start on gabapentin 200 mg which hopefully will help with the neck as well as lower back pain follow-up with me again 4 to 6 weeks.  Patient used to have good results with a chiropractor when she lived in [place] and we can consider possible manipulation and follow-up depending.

## 2020-07-28 NOTE — Patient Instructions (Addendum)
Good to see you xrays today Gabapentin 200 mg niat night Chart Cherry 1200 mg at ight PT [facility] See me again in 4 weeks

## 2020-08-02 NOTE — Progress Notes (Signed)
Chief Complaint:   OBESITY Madison Palmer is here to discuss her progress with her obesity treatment plan along with follow-up of her obesity related diagnoses. Keeanna is on keeping a food journal and adhering to recommended goals of 1700-1800 calories and 95 g protein and states she is following her eating plan approximately 100% of the time. Riah states she is walking more.  Today's visit was #: 6 Starting weight: 357 lbs Starting date: 02/27/2018 Today's weight: 321 lbs Today's date: 07/26/2020 Total lbs lost to date: 36 lbs Total lbs lost since last in-office visit: 0  Interim History: Jennifer has been journaling and hitting goals 100% of the time. She is not meeting 1650, but hitting 1700 calories frequently. She is very occasionally going over 2,000 calories. She does report some indulgent eating with sporting events. She has no plans for the upcoming few weeks. Pt is still looking for a job currently. She is typically doing a sandwich for lunch.  Subjective:   1. Essential hypertension Orianna's BP is slightly elevated today. Pt denies chest pain, chest pressure and headache. She is on amlodipine, labetalol, and lisinopril.  2. Hyperparathyroidism ([place]) Meily sees [facility]. She has an appointment with Dr. Garrel Ridgel on October 28, 2020.  Assessment/Plan:   1. Essential hypertension Lakena is working on healthy weight loss and exercise to improve blood pressure control. We will watch for signs of hypotension as she continues her lifestyle modifications. Continue current meds with no change.  2. Hyperparathyroidism ([place]) Follow up with Dr. Garrel Ridgel for repeat 24 hour urine Ca and CaSR gene mutation test.  3. Class 3 severe obesity with serious comorbidity and body mass index (BMI) of 60.0 to 69.9 in adult, unspecified obesity type ([place]) Velicia is currently in the action stage of change. As such, her goal is to continue with weight loss efforts. She has agreed to the  Category 3 Plan and keeping a food journal and adhering to recommended goals of 1600-1700 calories and 100+ g protein.   Exercise goals: All adults should avoid inactivity. Some physical activity is better than none, and adults who participate in any amount of physical activity gain some health benefits.  Behavioral modification strategies: increasing lean protein intake, meal planning and cooking strategies, keeping healthy foods in the home and planning for success.  Alona has agreed to follow-up with our clinic in 2-3 weeks. She was informed of the importance of frequent follow-up visits to maximize her success with intensive lifestyle modifications for her multiple health conditions.   Objective:   Blood pressure (!) 149/85, pulse 78, temperature 98.2 F (36.8 C), temperature source Oral, height 5\' 3"  (1.6 m), weight (!) 321 lb (145.6 kg), last menstrual period 05/02/2017, SpO2 97 %. Body mass index is 56.86 kg/m.  General: Cooperative, alert, well developed, in no acute distress. HEENT: Conjunctivae and lids unremarkable. Cardiovascular: Regular rhythm.  Lungs: Normal work of breathing. Neurologic: No focal deficits.   Lab Results  Component Value Date   CREATININE 1.01 (H) 06/10/2020   BUN 23 06/10/2020   NA 144 06/10/2020   K 4.0 06/10/2020   CL 102 06/10/2020   CO2 25 06/10/2020   Lab Results  Component Value Date   ALT 11 06/10/2020   AST 12 06/10/2020   ALKPHOS 90 06/10/2020   BILITOT 0.3 06/10/2020   Lab Results  Component Value Date   HGBA1C 5.6 06/01/2020   HGBA1C 5.7 (H) 12/11/2019   HGBA1C 5.7 (H) 12/24/2018   HGBA1C  6.0 (H) 02/27/2018   HGBA1C 5.7 09/10/2017   Lab Results  Component Value Date   INSULIN 18.9 06/01/2020   INSULIN 11.4 12/11/2019   INSULIN 21.1 12/30/2018   INSULIN 9.6 02/27/2018   Lab Results  Component Value Date   TSH 2.430 06/01/2020   Lab Results  Component Value Date   CHOL 216 (H) 06/10/2020   HDL 50 06/10/2020    LDLCALC 138 (H) 06/10/2020   TRIG 154 (H) 06/10/2020   CHOLHDL 5.6 (H) 12/24/2018   Lab Results  Component Value Date   WBC 5.4 09/25/2018   HGB 12.7 09/25/2018   HCT 40.4 09/25/2018   MCV 94.6 09/25/2018   PLT 229 09/25/2018   Lab Results  Component Value Date   IRON 44 02/20/2018   TIBC 347 05/29/2017     Attestation Statements:   Reviewed by clinician on day of visit: allergies, medications, problem list, medical history, surgical history, family history, social history, and previous encounter notes.  Time spent on visit including pre-visit chart review and post-visit care and charting was 15 minutes.   Coral Ceo, am acting as transcriptionist for Coralie Common, MD.   I have reviewed the above documentation for accuracy and completeness, and I agree with the above. - Jinny Blossom, MD

## 2020-08-11 ENCOUNTER — Ambulatory Visit (INDEPENDENT_AMBULATORY_CARE_PROVIDER_SITE_OTHER): Payer: BC Managed Care – PPO | Admitting: Primary Care

## 2020-08-11 ENCOUNTER — Other Ambulatory Visit: Payer: Self-pay

## 2020-08-11 ENCOUNTER — Ambulatory Visit: Payer: BC Managed Care – PPO | Admitting: Primary Care

## 2020-08-11 ENCOUNTER — Encounter: Payer: Self-pay | Admitting: Primary Care

## 2020-08-11 VITALS — BP 130/82 | HR 78 | Temp 97.3°F | Ht 63.0 in | Wt 324.0 lb

## 2020-08-11 DIAGNOSIS — J984 Other disorders of lung: Secondary | ICD-10-CM | POA: Diagnosis not present

## 2020-08-11 DIAGNOSIS — R918 Other nonspecific abnormal finding of lung field: Secondary | ICD-10-CM

## 2020-08-11 DIAGNOSIS — G4733 Obstructive sleep apnea (adult) (pediatric): Secondary | ICD-10-CM | POA: Diagnosis not present

## 2020-08-11 DIAGNOSIS — Z9989 Dependence on other enabling machines and devices: Secondary | ICD-10-CM | POA: Diagnosis not present

## 2020-08-11 NOTE — Patient Instructions (Addendum)
  Nice seeing you today Great compliance with CPAP, no changes today. Look into getting incentive spirometer to practice deep breathing exercises d/t restrictive lung disease   Orders: CT chest wo contrast re: pulmonary nodules   Follow-up  6 months with Dr. Elsworth Soho

## 2020-08-11 NOTE — Assessment & Plan Note (Signed)
-   No respiratory symptoms currently - PFTs in October 2021 showed severe restriction, likely extraparenchymal d/t obesity/ FEV1 0.88 (33%), ratio 76, DLCOco 162% - Encourage weight loss and deep breathing exercises with incentive spirometer

## 2020-08-11 NOTE — Progress Notes (Signed)
@Patient  ID: Madison Palmer, female    DOB: [birth year], 55 y.o.   MRN: [id number]  Chief Complaint  Patient presents with  . Follow-up    No current respiratory concerns.     Referring provider: Flossie Buffy, NP  HPI: 55 year old female, never smoked.  Past medical history significant for OSA on CPAP, pulmonary nodules, chronic respiratory failure with hypoxia, chronic diastolic heart failure.  Patient of Dr. Elsworth Soho, last seen in office on 02/11/2020.  Ordered for repeat CT chest without contrast to monitor pulmonary nodules, possibility of sarcoidosis exist.  Obtaining PFTs to rule out obstruction.   08/11/2020 Patient presents today for 51-month follow-up.  She is doing well.  No acute respiratory complaints. CT chest in October 2021 showed stable pulmonary nodules, favoring benign cause. No frank features of sarcoidosis or ILD. She has chosen to defer biopsy. Pulmonary function testing showed severe restriction felt to be d/t obesity. Wearing CPAP everynight, no issues with mask or pressure. Denies shortness of breath, persistent cough, chest tightness.    Download 07/11/2020-08/09/2020 Usage 30/30 days (100%); 100% greater than 4 hours Average usage 11 hours 5 minutes Pressure 9 cm H2O AHI 0.7   Imaging: 02/18/2020 CT chest showed- stable pulmonary nodules, some slightly decreased in size. Favor benign cause. No frank features of sarcoidosis. No adenopathy or signs of interstitial lung disease. Recommend follow-up in 6 months to complete total of 2 years surveillance.  Pulmonary function testing 02/25/2020-FVC 1.15 (34%), FEV1 0.88 (33%), ratio 76, DLCOco 162% Severe restriction, likely extraparenchymal due to obesity.  Increased diffusion  Allergies  Allergen Reactions  . Other     Other reaction(s): Other (See Comments) Instructed not to take by Cardiology.  . Fentanyl Hives  . Levofloxacin Hives  . Midazolam Hives  . Pollen Extract     seasonal  . Atorvastatin      Muscle pain in legs   . Doxycycline Itching and Swelling  . Hydralazine Hcl Other (See Comments)    Hypercalcemia   . Rosuvastatin     Abdominal pain    Immunization History  Administered Date(s) Administered  . Influenza,inj,Quad PF,6+ Mos 05/15/2017, 02/20/2018, 02/20/2019, 02/11/2020  . Influenza-Unspecified 02/14/2013, 02/23/2014, 04/20/2015, 05/15/2017, 02/20/2018  . Moderna Sars-Covid-2 Vaccination 08/24/2019, 09/26/2019  . Pneumococcal-Unspecified 02/10/2008, 02/09/2009  . Tdap 09/10/2017    Past Medical History:  Diagnosis Date  . Anemia   . Anxiety   . Arrhythmia    tachycardia  . Arthritis   . Chickenpox   . Depression   . Diverticulitis   . GERD (gastroesophageal reflux disease)   . Glaucoma   . Hyperlipidemia   . Hyperparathyroidism ([place])   . Hypertension   . Inflammatory polyps of colon ([place])   . LVH (left ventricular hypertrophy)   . Lymphedema   . PCOS (polycystic ovarian syndrome)   . Prediabetes   . Recurrent UTI   . Sleep apnea    CPAP  . Thyroid disease   . Vitamin D deficiency     Tobacco History: Social History   Tobacco Use  Smoking Status Never Smoker  Smokeless Tobacco Never Used   Counseling given: Not Answered   Outpatient Medications Prior to Visit  Medication Sig Dispense Refill  . acetaminophen (TYLENOL) 500 MG tablet Take 1,000 mg by mouth 3 (three) times daily as needed for moderate pain or headache.    Marland Kitchen amLODipine (NORVASC) 5 MG tablet Take 5 mg by mouth daily.    . cetirizine (ZYRTEC ALLERGY) 10 MG  tablet Take 1 tablet (10 mg total) by mouth daily. 90 tablet 1  . cloNIDine (CATAPRES) 0.1 MG tablet 2 (two) times daily.     . divalproex (DEPAKOTE) 500 MG DR tablet Take 1 tablet (500 mg total) by mouth at bedtime. 30 tablet 2  . esomeprazole (NEXIUM) 40 MG capsule TAKE ONE CAPSULE(40 MG TOTAL) BY MOUTH TWICE DAILY 180 capsule 2  . fenofibrate (TRICOR) 145 MG tablet Take 1 tablet (145 mg total) by mouth daily. 90 tablet 1   . fluticasone (FLONASE) 50 MCG/ACT nasal spray Place 2 sprays into both nostrils daily.    . furosemide (LASIX) 40 MG tablet TAKE 1 TABLET(40 MG) BY MOUTH DAILY 90 tablet 3  . gabapentin (NEURONTIN) 100 MG capsule Take 2 capsules (200 mg total) by mouth at bedtime. 180 capsule 3  . labetalol (NORMODYNE) 300 MG tablet TAKE 2 TABLETS(600 MG) BY MOUTH TWICE DAILY (Patient taking differently: Takes 1 tablet twice a day.) 360 tablet 1  . levothyroxine (SYNTHROID) 75 MCG tablet Take 1 tablet (75 mcg total) by mouth daily. 90 tablet 0  . lisinopril (ZESTRIL) 40 MG tablet Take 1 tablet (40 mg total) by mouth daily. 90 tablet 3  . Lurasidone HCl 60 MG TABS Take 1 tablet (60 mg total) by mouth in the morning. 30 tablet 2  . metFORMIN (GLUCOPHAGE) 500 MG tablet Take 1 tablet (500 mg total) by mouth daily with breakfast. 90 tablet 0  . Potassium Chloride ER 20 MEQ TBCR TAKE 2 TABLETS BY MOUTH DAILY (Patient taking differently: Take 1 tablet by mouth daily. 1 tablet once a day) 180 tablet 0   No facility-administered medications prior to visit.    Review of Systems  Review of Systems  Constitutional: Negative.   Respiratory: Negative.   Cardiovascular: Negative.     Physical Exam  BP 130/82 (BP Location: Right Arm, Cuff Size: Normal)   Pulse 78   Temp (!) 97.3 F (36.3 C) (Temporal)   Ht 5\' 3"  (1.6 m)   Wt (!) 324 lb (147 kg)   LMP 05/02/2017   SpO2 98% Comment: RA  BMI 57.39 kg/m  Physical Exam Constitutional:      Appearance: Normal appearance.  HENT:     Mouth/Throat:     Comments: Deferred d.t masking Cardiovascular:     Rate and Rhythm: Normal rate and regular rhythm.  Pulmonary:     Effort: Pulmonary effort is normal.     Breath sounds: Normal breath sounds.  Skin:    General: Skin is warm and dry.  Neurological:     General: No focal deficit present.     Mental Status: She is alert and oriented to person, place, and time. Mental status is at baseline.  Psychiatric:         Mood and Affect: Mood normal.        Behavior: Behavior normal.        Thought Content: Thought content normal.        Judgment: Judgment normal.      Lab Results:  CBC    Component Value Date/Time   WBC 5.4 09/25/2018 0313   RBC 4.27 09/25/2018 0313   HGB 12.7 09/25/2018 0313   HCT 40.4 09/25/2018 0313   PLT 229 09/25/2018 0313   MCV 94.6 09/25/2018 0313   MCH 29.7 09/25/2018 0313   MCHC 31.4 09/25/2018 0313   RDW 14.2 09/25/2018 0313   LYMPHSABS 1.1 09/25/2018 0313   MONOABS 0.6 09/25/2018 0313   EOSABS  0.1 09/25/2018 0313   BASOSABS 0.0 09/25/2018 0313    BMET    Component Value Date/Time   NA 144 06/10/2020 1441   K 4.0 06/10/2020 1441   CL 102 06/10/2020 1441   CO2 25 06/10/2020 1441   GLUCOSE 111 (H) 06/10/2020 1441   GLUCOSE 110 (H) 12/24/2018 1402   BUN 23 06/10/2020 1441   CREATININE 1.01 (H) 06/10/2020 1441   CREATININE 1.13 (H) 12/24/2018 1402   CALCIUM 10.7 (H) 06/10/2020 1441   GFRNONAA 63 06/10/2020 1441   GFRAA 73 06/10/2020 1441    BNP    Component Value Date/Time   BNP 147.2 (H) 09/15/2018 0918    ProBNP    Component Value Date/Time   PROBNP 173.0 (H) 02/20/2018 1536    Imaging: DG Cervical Spine Complete  Result Date: 07/29/2020 CLINICAL DATA:  Chronic neck pain EXAM: CERVICAL SPINE - COMPLETE 4+ VIEW COMPARISON:  None. FINDINGS: Seven cervical segments are well visualized. Vertebral body height is well maintained. Mild osteophytic changes are noted at C4-5, C5-6 and C6-7. No soft tissue abnormality is noted. Neural foramina are patent. The odontoid is within normal limits. IMPRESSION: Degenerative change without acute abnormality. Electronically Signed   By: Inez Catalina M.D.   On: 07/29/2020 03:25   DG Lumbar Spine Complete  Result Date: 07/29/2020 CLINICAL DATA:  Low back pain, no known injury, initial EXAM: [place] 4+ VIEW COMPARISON:  12/14/2017 FINDINGS: Five lumbar type vertebral bodies are well visualized.  Vertebral body height is well maintained. No pars defects are noted. Facet hypertrophic changes are noted. No anterolisthesis is seen. No soft tissue abnormality is noted. IMPRESSION: Multilevel degenerative changes are noted. Electronically Signed   By: Inez Catalina M.D.   On: 07/29/2020 03:32   DG Shoulder Right  Result Date: 07/29/2020 CLINICAL DATA:  Chronic right shoulder pain, no known injury, initial encounter EXAM: RIGHT SHOULDER - 2+ VIEW COMPARISON:  None. FINDINGS: Degenerative changes of the acromioclavicular joint are seen. No fracture or dislocation is noted. Underlying bony thorax appears within normal limits. IMPRESSION: Degenerative changes without acute abnormality. Electronically Signed   By: Inez Catalina M.D.   On: 07/29/2020 03:19     Assessment & Plan:   OSA on CPAP - Patient is 100% compliant with CPAP and reports benefit from use - Pressure 9cm h20; residual AHI 0.7 - FU in 6 months with Dr. Elsworth Soho  Pulmonary nodules - Repeat CT chest in October 2021 showed stable pulmonary nodules, some decreased in size and favored to be benign. No frank evidence of sarcoidosis or ILD. Recommend 6 month follow-up to complete 2 year surveillance    Restrictive lung disease - No respiratory symptoms currently - PFTs in October 2021 showed severe restriction, likely extraparenchymal d/t obesity/ FEV1 0.88 (33%), ratio 76, DLCOco 162% - Encourage weight loss and deep breathing exercises with incentive spirometer       Martyn Ehrich, NP 08/11/2020

## 2020-08-11 NOTE — Assessment & Plan Note (Signed)
-   Repeat CT chest in October 2021 showed stable pulmonary nodules, some decreased in size and favored to be benign. No frank evidence of sarcoidosis or ILD. Recommend 6 month follow-up to complete 2 year surveillance

## 2020-08-11 NOTE — Assessment & Plan Note (Signed)
-   Patient is 100% compliant with CPAP and reports benefit from use - Pressure 9cm h20; residual AHI 0.7 - FU in 6 months with Dr. Elsworth Soho

## 2020-08-12 ENCOUNTER — Other Ambulatory Visit: Payer: Self-pay | Admitting: Cardiology

## 2020-08-12 ENCOUNTER — Other Ambulatory Visit (HOSPITAL_COMMUNITY): Payer: Self-pay | Admitting: Psychiatry

## 2020-08-12 DIAGNOSIS — F319 Bipolar disorder, unspecified: Secondary | ICD-10-CM

## 2020-08-13 ENCOUNTER — Ambulatory Visit (INDEPENDENT_AMBULATORY_CARE_PROVIDER_SITE_OTHER)
Admission: RE | Admit: 2020-08-13 | Discharge: 2020-08-13 | Disposition: A | Discharge status: Home / Self Care | Payer: BC Managed Care – PPO | Source: Ambulatory Visit | Attending: Primary Care | Admitting: Primary Care

## 2020-08-13 ENCOUNTER — Other Ambulatory Visit: Payer: Self-pay

## 2020-08-13 DIAGNOSIS — R918 Other nonspecific abnormal finding of lung field: Secondary | ICD-10-CM | POA: Diagnosis not present

## 2020-08-16 ENCOUNTER — Encounter (INDEPENDENT_AMBULATORY_CARE_PROVIDER_SITE_OTHER): Payer: Self-pay | Admitting: Family Medicine

## 2020-08-16 ENCOUNTER — Ambulatory Visit (INDEPENDENT_AMBULATORY_CARE_PROVIDER_SITE_OTHER): Payer: BC Managed Care – PPO | Admitting: Family Medicine

## 2020-08-16 ENCOUNTER — Other Ambulatory Visit: Payer: Self-pay

## 2020-08-16 VITALS — BP 140/89 | HR 76 | Temp 98.3°F | Ht 63.0 in | Wt 323.0 lb

## 2020-08-16 DIAGNOSIS — Z9189 Other specified personal risk factors, not elsewhere classified: Secondary | ICD-10-CM | POA: Diagnosis not present

## 2020-08-16 DIAGNOSIS — R632 Polyphagia: Secondary | ICD-10-CM | POA: Diagnosis not present

## 2020-08-16 DIAGNOSIS — E038 Other specified hypothyroidism: Secondary | ICD-10-CM | POA: Diagnosis not present

## 2020-08-16 DIAGNOSIS — Z6841 Body Mass Index (BMI) 40.0 and over, adult: Secondary | ICD-10-CM | POA: Diagnosis not present

## 2020-08-16 MED ORDER — OZEMPIC (0.25 OR 0.5 MG/DOSE) 2 MG/1.5ML ~~LOC~~ SOPN: 0.2500 mg | PEN_INJECTOR | SUBCUTANEOUS | 0 refills | Status: DC

## 2020-08-17 ENCOUNTER — Ambulatory Visit: Payer: BC Managed Care – PPO | Attending: Family Medicine | Admitting: Physical Therapy

## 2020-08-17 ENCOUNTER — Encounter: Payer: Self-pay | Admitting: Physical Therapy

## 2020-08-17 DIAGNOSIS — R252 Cramp and spasm: Secondary | ICD-10-CM

## 2020-08-17 DIAGNOSIS — M545 Low back pain, unspecified: Secondary | ICD-10-CM | POA: Insufficient documentation

## 2020-08-17 DIAGNOSIS — M6281 Muscle weakness (generalized): Secondary | ICD-10-CM

## 2020-08-17 DIAGNOSIS — R262 Difficulty in walking, not elsewhere classified: Secondary | ICD-10-CM | POA: Diagnosis present

## 2020-08-17 DIAGNOSIS — M542 Cervicalgia: Secondary | ICD-10-CM | POA: Diagnosis present

## 2020-08-17 DIAGNOSIS — G8929 Other chronic pain: Secondary | ICD-10-CM | POA: Diagnosis present

## 2020-08-17 NOTE — Patient Instructions (Signed)
Access Code: [email or web address] URL: [email or web address] Date: 08/17/2020 Prepared by: Amador Cunas  Exercises Standing Shoulder Row with Anchored Resistance - 1 x daily - 5 x weekly - 2 sets - 10 reps - 1-2 sec hold Shoulder Extension with Resistance - 1 x daily - 5 x weekly - 2 sets - 10 reps - 1-2 sec hold Seated Upper Trapezius Stretch - 1 x daily - 5 x weekly - 2 sets - 2 reps - 15-20 sec hold Supine Lower Trunk Rotation - 1 x daily - 5 x weekly - 2 sets - 5 reps - 15-20 sec hold

## 2020-08-17 NOTE — Therapy (Signed)
[place]. [place], [place], [address] Phone: [phone]   Fax:  [phone]  Physical Therapy Evaluation  Patient Details  Name: Madison Palmer MRN: [id number] Date of Birth: 09-02-1965 Referring Provider (PT): Esmeralda Arthur Date: 08/17/2020   PT End of Session - 08/17/20 1348    Visit Number 1    Date for PT Re-Evaluation 11/09/20    PT Start Time 1315    PT Stop Time 1350    PT Time Calculation (min) 35 min    Activity Tolerance Patient tolerated treatment well    Behavior During Therapy [facility] for tasks assessed/performed           Past Medical History:  Diagnosis Date  . Anemia   . Anxiety   . Arrhythmia    tachycardia  . Arthritis   . Chickenpox   . Depression   . Diverticulitis   . GERD (gastroesophageal reflux disease)   . Glaucoma   . Hyperlipidemia   . Hyperparathyroidism ([address])   . Hypertension   . Inflammatory polyps of colon ([place])   . LVH (left ventricular hypertrophy)   . Lymphedema   . PCOS (polycystic ovarian syndrome)   . Prediabetes   . Recurrent UTI   . Sleep apnea    CPAP  . Thyroid disease   . Vitamin D deficiency     Past Surgical History:  Procedure Laterality Date  . BREAST BIOPSY  2015  . CESAREAN SECTION  2004  . INNER EAR SURGERY     ear and sinus surgery  . LAPAROSCOPIC REPAIR AND REMOVAL OF GASTRIC BAND    . OOPHORECTOMY Left   . TONSILLECTOMY AND ADENOIDECTOMY      There were no vitals filed for this visit.    Subjective Assessment - 08/17/20 1320    Subjective Pt reports that she has had LBP and B cervical pain intermittently over the years and worsening recently. Pt states she was seen by this clinic for a similar problem in 2020; got sick shortly after and had to move back to [place] for her recovery. While there, she states she saw a chiropractor and that this helped ease her pain. Has not sought treatment since coming back to [facility] in July 2021. Pt  states occasional N/T in RUE. Denies N/T in BLE. Pt states she has been walking with the cane for a while. Is having breathing problems d/t chronic pulmonary conditions and states this is a major limiting factor in her endurance as well.    How long can you stand comfortably? ~5 min (states more limited in standing by knees than back)    How long can you walk comfortably? ~3-4 minutes (limited by pulmonary function)    Diagnostic tests xrays of lumbar spine showing degenerative changes    Patient Stated Goals have less pain    Currently in Pain? Yes    Pain Score 4     Pain Location Back    Pain Orientation Lower    Pain Descriptors / Indicators Aching;Sore    Pain Type Chronic pain    Pain Onset More than a month ago    Pain Frequency Constant    Aggravating Factors  standing, walking, activity    Pain Relieving Factors rest, meds    Effect of Pain on Daily Activities difficulty with prolonged standing/walking, uses cane              [facility] PT Assessment - 08/17/20  0001      Assessment   Medical Diagnosis LBP, neck pain    Referring Provider (PT) Smith    Hand Dominance Right    Next MD Visit 08/25/2020    Prior Therapy PT for similar issue in 2020      Precautions   Precautions None      Restrictions   Weight Bearing Restrictions No      Balance Screen   Has the patient fallen in the past 6 months Yes    How many times? 1   missed the chair when sitting down; no falls when walking   Has the patient had a decrease in activity level because of a fear of falling?  No    Is the patient reluctant to leave their home because of a fear of falling?  No      Home Environment   Additional Comments does housework, ranch home      Prior Function   Level of Independence Independent;Independent with basic ADLs;Independent with community mobility with device;Independent with household mobility with device;Independent with homemaking with ambulation;Independent with transfers     Vocation Unemployed    Leisure no exercise      Functional Tests   Functional tests Sit to Stand      Sit to Stand   Comments limited by SOB and R knee pain      ROM / Strength   AROM / PROM / Strength AROM;Strength      AROM   Overall AROM Comments B shoulder AROM WFL; cervical AROM mild limited B lat flexion    AROM Assessment Site Lumbar    Lumbar Flexion limited 25%    Lumbar Extension limited 50%    Lumbar - Right Side Bend limited 50% + pain R LB    Lumbar - Left Side Bend limited 50% + pain R LB    Lumbar - Right Rotation limited 50% + pain R LB    Lumbar - Left Rotation limited 50% + pain R LB      Strength   Overall Strength Comments 4/5 BLE      Palpation   Palpation comment tenderness R UT/cervical paraspinals, tightness/hypomobility of lumbar spine      Transfers   Five time sit to stand comments  <12 sec; knee pain + SOB      Ambulation/Gait   Gait Comments gait with SPC, trendelenburg, and mild antalgic on RLE                      Objective measurements completed on examination: See above findings.               PT Education - 08/17/20 1348    Education Details Pt educated on POC and HEP    Person(s) Educated Patient    Methods Explanation;Demonstration;Handout    Comprehension Verbalized understanding;Returned demonstration            PT Short Term Goals - 08/17/20 1443      PT SHORT TERM GOAL #1   Title independent with initial HEP    Time 2    Period Weeks    Status New    Target Date 08/31/20             PT Long Term Goals - 08/17/20 1443      PT LONG TERM GOAL #1   Title understand proper body mechanics for ADL's    Time 8    Period Weeks  Status New    Target Date 10/12/20      PT LONG TERM GOAL #2   Title increase lumbar ROM 25%    Time 8    Period Weeks    Status New    Target Date 10/12/20      PT LONG TERM GOAL #3   Title decrease pain 50%    Time 8    Period Weeks    Status New     Target Date 10/12/20      PT LONG TERM GOAL #4   Title report able to do her normal housework without increase pain    Time 8    Period Weeks    Status New    Target Date 10/12/20                  Plan - 08/17/20 1439    Clinical Impression Statement Pt presents to clinic with reports of chronic LBP and R sided cervical pain radiating to RUE present for years and worsening intermittently. Pt ambulates into clinic with SPC, antalgic gait RLE, and trendelenburg. Limited in mobility and functional strength testing d/t cardiopulmonary function. Pt demos significant limitations in lumbar AROM, cervical lat flexion, and functional weakness of BLE. Tender to palpation R UT and lumbar paraspinals. Pt would benefit from skilled PT to address pain and increase functional mobility.    Personal Factors and Comorbidities Comorbidity 2    Comorbidities obesity, lymphedema    Examination-Activity Limitations Locomotion Level;Stand;Stairs    Examination-Participation Restrictions Community Activity;Interpersonal Relationship    Stability/Clinical Decision Making Stable/Uncomplicated    Clinical Decision Making Low    Rehab Potential Good    PT Frequency 2x / week    PT Duration 8 weeks    PT Treatment/Interventions ADLs/Self Care Home Management;Cryotherapy;Electrical Stimulation;Moist Heat;Functional mobility training;Therapeutic activities;Therapeutic exercise;Balance training;Neuromuscular re-education;Manual techniques;Patient/family education;Dry needling;Gait training;Iontophoresis 4mg /ml Dexamethasone    PT Next Visit Plan Multi joint interventions, functional strengthening/endurance ex's, manual/modalities as indicated    PT Home Exercise Plan see pt instructions    Consulted and Agree with Plan of Care Patient           Patient will benefit from skilled therapeutic intervention in order to improve the following deficits and impairments:  Abnormal gait,Decreased range of  motion,Difficulty walking,Increased muscle spasms,Pain,Impaired flexibility,Improper body mechanics,Decreased strength,Decreased mobility,Obesity,Cardiopulmonary status limiting activity,Decreased endurance  Visit Diagnosis: Muscle weakness (generalized)  Chronic bilateral low back pain without sciatica  Neck pain  Cramp and spasm  Difficulty in walking, not elsewhere classified     Problem List Patient Active Problem List   Diagnosis Date Noted  . Restrictive lung disease 08/11/2020  . Leg swelling 01/14/2019  . Elevated coronary artery calcium score 01/14/2019  . Chronic respiratory failure ([place]) 09/15/2018  . Acute on chronic respiratory failure with hypoxia and hypercapnia ([place]) 09/15/2018  . Disease of thyroid gland 09/15/2018  . Bipolar 1 disorder ([place]) 09/12/2018  . Prediabetes 09/05/2018  . Class 3 severe obesity with serious comorbidity and body mass index (BMI) of 50.0 to 59.9 in adult ([place]) 09/05/2018  . Bipolar affective disorder, manic, severe, with psychotic behavior ([place]) 08/28/2018  . Bipolar I disorder, current or most recent episode manic, with psychotic features ([place])   . Adenomatous polyp 08/18/2018  . Educated about COVID-19 virus infection 08/15/2018  . Pulmonary vascular congestion 06/24/2018  . Chronic diastolic heart failure ([place]) 05/15/2018  . Dyspnea 05/15/2018  . Pulmonary nodules 05/11/2018  . Hypertensive urgency 05/11/2018  . Chronic respiratory  failure with hypoxia ([place]) 05/10/2018  . 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 DDD (degenerative disc disease), lumbar 03/21/2018  . Primary osteoarthritis of both knees 03/21/2018  . Dysphagia 03/19/2018  . PVC (premature ventricular contraction) 03/12/2018  . LVH (left ventricular hypertrophy) 03/12/2018  . Generalized edema 03/12/2018  . Bilateral carotid artery stenosis 03/12/2018  . Weight gain 01/25/2018  . Hemorrhoids 01/05/2018  . Generalized postprandial  abdominal pain 01/05/2018  . Hiatal hernia 01/05/2018  . Esophageal dysmotilities 01/05/2018  . Mixed hyperlipidemia 09/12/2017  . Multinodular goiter 07/27/2017  . Hyperparathyroidism ([place]) 06/13/2017  . Vitamin D deficiency 06/13/2017  . History of colonic polyps 05/15/2017  . Eustachian tube dysfunction, right 05/15/2017  . Lymphatic edema 05/15/2017  . Essential hypertension 05/14/2017  . Anemia 05/11/2017  . Arrhythmia 05/11/2017  . Diverticulosis 05/11/2017  . GERD (gastroesophageal reflux disease) 05/11/2017  . Hypothyroidism 05/11/2017  . Lower extremity edema 05/11/2017  . Morbid obesity ([place]) 05/11/2017  . Polycystic ovary syndrome 05/11/2017  . OSA on CPAP 05/11/2017  . Neck pain 05/11/2017  . Allergic rhinitis 05/11/2017  . 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 [place], [place], [address] Phone: [phone]   Fax:  [phone]  Name: Madison Palmer MRN: [id number] Date of Birth: 1966/04/30

## 2020-08-18 NOTE — Progress Notes (Signed)
Please let patient know CT chest showed stable pulmonary nodules c/w known sarcoid. Three vessel coronary artery calcifications, she should follow-up with cardiology if she has one. If not we can refer

## 2020-08-18 NOTE — Progress Notes (Signed)
Patient of yours following for pulmonary nodules. Appears stable. Do you want further work up ?

## 2020-08-19 ENCOUNTER — Telehealth: Payer: Self-pay | Admitting: Cardiology

## 2020-08-19 NOTE — Telephone Encounter (Signed)
I was aware of it at her last appt and did not think testing was indicated without symptoms.  I can discuss this again later this year at her one year follow up.

## 2020-08-19 NOTE — Telephone Encounter (Signed)
Returned call to patient who states that she had CT scan done 4/15 for pulmonology.  Patient states that she was told by pulmonology that there were coronary artery calcifications on that scan and per patient was told to contact her cardiologist. These results are available in [place].   Patient states she is not having any cardiac related symptoms and feels well. Patient states that this may not be new and reports she has had previous cardiac testing by Dr. Percival Spanish.   Patient would like  Dr. Percival Spanish to review the scan and see if he has any recommendations.   Advised patient that I would forward message to Dr. Percival Spanish for him to review.  Advised patient to call back to office with any issues, questions, or concerns. Patient verbalized understanding.

## 2020-08-19 NOTE — Telephone Encounter (Signed)
Patient had a CT on 08/13/20 and the results are in the chart. She states she already discussed the results with her pulmonologist, but she is hoping Dr. Percival Spanish can also take a look and provide a recommendation. She states there is some arterial calcification.

## 2020-08-20 NOTE — Telephone Encounter (Signed)
Patient made aware of Dr. Rosezella [place] recommendations.   Advised patient to call back to office with any issues, questions, or concerns. Patient verbalized understanding.

## 2020-08-24 ENCOUNTER — Telehealth (INDEPENDENT_AMBULATORY_CARE_PROVIDER_SITE_OTHER): Payer: Self-pay | Admitting: Emergency Medicine

## 2020-08-24 ENCOUNTER — Encounter: Payer: Self-pay | Admitting: Physical Therapy

## 2020-08-24 ENCOUNTER — Other Ambulatory Visit: Payer: Self-pay

## 2020-08-24 ENCOUNTER — Ambulatory Visit: Payer: BC Managed Care – PPO | Admitting: Physical Therapy

## 2020-08-24 DIAGNOSIS — M6281 Muscle weakness (generalized): Secondary | ICD-10-CM | POA: Diagnosis not present

## 2020-08-24 DIAGNOSIS — G8929 Other chronic pain: Secondary | ICD-10-CM

## 2020-08-24 DIAGNOSIS — M542 Cervicalgia: Secondary | ICD-10-CM

## 2020-08-24 DIAGNOSIS — R252 Cramp and spasm: Secondary | ICD-10-CM

## 2020-08-24 DIAGNOSIS — M545 Low back pain, unspecified: Secondary | ICD-10-CM

## 2020-08-24 NOTE — Therapy (Signed)
[place]. [place], [place], [address] Phone: [phone]   Fax:  [phone]  Physical Therapy Treatment  Patient Details  Name: ANICIA LEUTHOLD MRN: [id number] Date of Birth: 1965-10-30 Referring Provider (PT): Esmeralda Arthur Date: 08/24/2020   PT End of Session - 08/24/20 1014    Visit Number 2    Date for PT Re-Evaluation 11/09/20    PT Start Time 0935    PT Stop Time 1011    PT Time Calculation (min) 36 min    Activity Tolerance Patient tolerated treatment well    Behavior During Therapy [facility] for tasks assessed/performed           Past Medical History:  Diagnosis Date  . Anemia   . Anxiety   . Arrhythmia    tachycardia  . Arthritis   . Chickenpox   . Depression   . Diverticulitis   . GERD (gastroesophageal reflux disease)   . Glaucoma   . Hyperlipidemia   . Hyperparathyroidism ([place])   . Hypertension   . Inflammatory polyps of colon ([place])   . LVH (left ventricular hypertrophy)   . Lymphedema   . PCOS (polycystic ovarian syndrome)   . Prediabetes   . Recurrent UTI   . Sleep apnea    CPAP  . Thyroid disease   . Vitamin D deficiency     Past Surgical History:  Procedure Laterality Date  . BREAST BIOPSY  2015  . CESAREAN SECTION  2004  . INNER EAR SURGERY     ear and sinus surgery  . LAPAROSCOPIC REPAIR AND REMOVAL OF GASTRIC BAND    . OOPHORECTOMY Left   . TONSILLECTOMY AND ADENOIDECTOMY      There were no vitals filed for this visit.   Subjective Assessment - 08/24/20 0940    Subjective Sore from sleeping I guess, R shoulder and neck    Currently in Pain? Yes    Pain Score 6     Pain Location Flank    Pain Orientation Right;Posterior;Upper                             [facility] Adult PT Treatment/Exercise - 08/24/20 0001      Exercises   Exercises Lumbar;Shoulder      Lumbar Exercises: Aerobic   UBE (Upper Arm Bike) L1.8 x3 min each      Lumbar  Exercises: Seated   Other Seated Lumbar Exercises Row green 2x10      Shoulder Exercises: Standing   Other Standing Exercises shrugs 5lb x10      Manual Therapy   Manual Therapy Soft tissue mobilization;Passive ROM    Soft tissue mobilization Cervical paraspinales, upper traps, rhomboids    Passive ROM cervical side bending with end range holds.                    PT Short Term Goals - 08/17/20 1443      PT SHORT TERM GOAL #1   Title independent with initial HEP    Time 2    Period Weeks    Status New    Target Date 08/31/20             PT Long Term Goals - 08/17/20 1443      PT LONG TERM GOAL #1   Title understand proper body mechanics for ADL's    Time 8    Period  Weeks    Status New    Target Date 10/12/20      PT LONG TERM GOAL #2   Title increase lumbar ROM 25%    Time 8    Period Weeks    Status New    Target Date 10/12/20      PT LONG TERM GOAL #3   Title decrease pain 50%    Time 8    Period Weeks    Status New    Target Date 10/12/20      PT LONG TERM GOAL #4   Title report able to do her normal housework without increase pain    Time 8    Period Weeks    Status New    Target Date 10/12/20                 Plan - 08/24/20 1014    Clinical Impression Statement Pt tolerated an initial progression to TE fair. Some increase pain and tightness in the both shoulders reported with active interventions. Postural cues required with rows and extensions. Tightness noted in the upper traps noted with palpation. Positive response to MT evident by reports of less tightness with mobility    Personal Factors and Comorbidities Comorbidity 2    Comorbidities obesity, lymphedema    Examination-Activity Limitations Locomotion Level;Stand;Stairs    Examination-Participation Restrictions Community Activity;Interpersonal Relationship    Stability/Clinical Decision Making Stable/Uncomplicated    Rehab Potential Good    PT Frequency 2x / week    PT  Duration 8 weeks    PT Treatment/Interventions ADLs/Self Care Home Management;Cryotherapy;Electrical Stimulation;Moist Heat;Functional mobility training;Therapeutic activities;Therapeutic exercise;Balance training;Neuromuscular re-education;Manual techniques;Patient/family education;Dry needling;Gait training;Iontophoresis 4mg /ml Dexamethasone    PT Next Visit Plan Multi joint interventions, functional strengthening/endurance ex's, manual/modalities as indicated           Patient will benefit from skilled therapeutic intervention in order to improve the following deficits and impairments:  Abnormal gait,Decreased range of motion,Difficulty walking,Increased muscle spasms,Pain,Impaired flexibility,Improper body mechanics,Decreased strength,Decreased mobility,Obesity,Cardiopulmonary status limiting activity,Decreased endurance  Visit Diagnosis: Neck pain  Cramp and spasm  Chronic bilateral low back pain without sciatica  Muscle weakness (generalized)     Problem List Patient Active Problem List   Diagnosis Date Noted  . Restrictive lung disease 08/11/2020  . Leg swelling 01/14/2019  . Elevated coronary artery calcium score 01/14/2019  . Chronic respiratory failure ([place]) 09/15/2018  . Acute on chronic respiratory failure with hypoxia and hypercapnia ([place]) 09/15/2018  . Disease of thyroid gland 09/15/2018  . Bipolar 1 disorder ([place]) 09/12/2018  . Prediabetes 09/05/2018  . Class 3 severe obesity with serious comorbidity and body mass index (BMI) of 50.0 to 59.9 in adult ([place]) 09/05/2018  . Bipolar affective disorder, manic, severe, with psychotic behavior ([place]) 08/28/2018  . Bipolar I disorder, current or most recent episode manic, with psychotic features ([place])   . Adenomatous polyp 08/18/2018  . Educated about COVID-19 virus infection 08/15/2018  . Pulmonary vascular congestion 06/24/2018  . Chronic diastolic heart failure ([place]) 05/15/2018  . Dyspnea 05/15/2018  . Pulmonary  nodules 05/11/2018  . Hypertensive urgency 05/11/2018  . Chronic respiratory failure with hypoxia ([place]) 05/10/2018  . 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 DDD (degenerative disc disease), lumbar 03/21/2018  . Primary osteoarthritis of both knees 03/21/2018  . Dysphagia 03/19/2018  . PVC (premature ventricular contraction) 03/12/2018  . LVH (left ventricular hypertrophy) 03/12/2018  . Generalized edema 03/12/2018  . Bilateral carotid artery stenosis 03/12/2018  . Weight  gain 01/25/2018  . Hemorrhoids 01/05/2018  . Generalized postprandial abdominal pain 01/05/2018  . Hiatal hernia 01/05/2018  . Esophageal dysmotilities 01/05/2018  . Mixed hyperlipidemia 09/12/2017  . Multinodular goiter 07/27/2017  . Hyperparathyroidism ([place]) 06/13/2017  . Vitamin D deficiency 06/13/2017  . History of colonic polyps 05/15/2017  . Eustachian tube dysfunction, right 05/15/2017  . Lymphatic edema 05/15/2017  . Essential hypertension 05/14/2017  . Anemia 05/11/2017  . Arrhythmia 05/11/2017  . Diverticulosis 05/11/2017  . GERD (gastroesophageal reflux disease) 05/11/2017  . Hypothyroidism 05/11/2017  . Lower extremity edema 05/11/2017  . Morbid obesity ([place]) 05/11/2017  . Polycystic ovary syndrome 05/11/2017  . OSA on CPAP 05/11/2017  . Neck pain 05/11/2017  . Allergic rhinitis 05/11/2017  . 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 [place], [place], [address] Phone: [phone]   Fax:  [phone]  Name: CURTINA GRILLS MRN: [id number] Date of Birth: 02-May-1965

## 2020-08-24 NOTE — Telephone Encounter (Signed)
Prior Auth Initiated: [employer] via Fax  Key: [id number]  Unable to initiated in covermymeds, nor by phone. Fax came through and was faxed back.

## 2020-08-24 NOTE — Progress Notes (Signed)
[place] Phone: [phone] Subjective:    I'm seeing this patient by the request  of:  [name], Charlene Brooke, NP  CC: Pain all over  DGL:[id number]   07/28/2020 Patient has had this for quite some time.  Patient does have degenerative disc disease seen on CT scan but will get a dedicated x-ray.  Discussed with patient about the importance of weight loss.  Patient is trying to work on this and is working with healthy weight and wellness.  Discussed posture dynamics.  Patient will require formal physical therapy and I am hoping that this will make some difference.  We will start on gabapentin 200 mg which hopefully will help with the neck as well as lower back pain follow-up with me again 4 to 6 weeks.  Patient used to have good results with a chiropractor when she lived in [place] and we can consider possible manipulation and follow-up depending.  Update 08/25/2020 Madison Palmer is a 55 y.o. female coming in with complaint of low back pain and cervical spine pain. Patient states that her pain has been worse over past 2 days. Gabapentin has not provided her with any relief.  Patient states that everything including her whole axial spine has been giving her trouble.  Patient has been trying to work a little bit with some of the exercises but finds it difficult.  Has been to physical therapy 2 times with very minimal benefit and does feel sore afterwards.  Xray lumbar 07/28/2020 IMPRESSION: Multilevel degenerative changes are noted.  Xray R shoulder 07/28/2020 IMPRESSION: Degenerative changes without acute abnormality.  Xray cervical 07/28/2020  IMPRESSION: Degenerative change without acute abnormality   Laboratory work-up showed that patient continues to have a elevated calcium level at 10.7 as well as significantly low vitamin D. Past Medical History:  Diagnosis Date  . Anemia   . Anxiety   . Arrhythmia     tachycardia  . Arthritis   . Chickenpox   . Depression   . Diverticulitis   . GERD (gastroesophageal reflux disease)   . Glaucoma   . Hyperlipidemia   . Hyperparathyroidism ([place])   . Hypertension   . Inflammatory polyps of colon ([place])   . LVH (left ventricular hypertrophy)   . Lymphedema   . PCOS (polycystic ovarian syndrome)   . Prediabetes   . Recurrent UTI   . Sleep apnea    CPAP  . Thyroid disease   . Vitamin D deficiency    Past Surgical History:  Procedure Laterality Date  . BREAST BIOPSY  2015  . CESAREAN SECTION  2004  . INNER EAR SURGERY     ear and sinus surgery  . LAPAROSCOPIC REPAIR AND REMOVAL OF GASTRIC BAND    . OOPHORECTOMY Left   . TONSILLECTOMY AND ADENOIDECTOMY     Social History   Socioeconomic History  . Marital status: Married    Spouse name: Not on file  . Number of children: 1  . Years of education: Not on file  . Highest education level: Not on file  Occupational History  . Occupation: Unemployed  Tobacco Use  . Smoking status: Never Smoker  . Smokeless tobacco: Never Used  Vaping Use  . Vaping Use: Never used  Substance and Sexual Activity  . Alcohol use: Yes    Comment: social  . Drug use: No  . Sexual activity: Not Currently  Other Topics Concern  . Not on file  Social History Narrative   Lives with son and companion.     Social Determinants of Health   Financial Resource Strain: Not on file  Food Insecurity: Not on file  Transportation Needs: Not on file  Physical Activity: Not on file  Stress: Not on file  Social Connections: Not on file   Allergies  Allergen Reactions  . Other     Other reaction(s): Other (See Comments) Instructed not to take by Cardiology.  . Fentanyl Hives  . Levofloxacin Hives  . Midazolam Hives  . Pollen Extract     seasonal  . Atorvastatin     Muscle pain in legs   . Doxycycline Itching and Swelling  . Hydralazine Hcl Other (See Comments)    Hypercalcemia   . Rosuvastatin      Abdominal pain   Family History  Adopted: Yes  Problem Relation Age of Onset  . Hearing loss Son        right   . Healthy Son     Current Outpatient Medications (Endocrine & Metabolic):  .  levothyroxine (SYNTHROID) 75 MCG tablet, Take 1 tablet (75 mcg total) by mouth daily. .  metFORMIN (GLUCOPHAGE) 500 MG tablet, Take 1 tablet (500 mg total) by mouth daily with breakfast. .  Semaglutide,0.25 or 0.5MG /DOS, (OZEMPIC, 0.25 OR 0.5 MG/DOSE,) 2 MG/1.5ML SOPN, Inject 0.25 mg into the skin once a week.  Current Outpatient Medications (Cardiovascular):  .  amLODipine (NORVASC) 5 MG tablet, Take 5 mg by mouth daily. .  cloNIDine (CATAPRES) 0.1 MG tablet, 2 (two) times daily.  .  fenofibrate (TRICOR) 145 MG tablet, Take 1 tablet (145 mg total) by mouth daily. .  furosemide (LASIX) 40 MG tablet, TAKE 1 TABLET(40 MG) BY MOUTH DAILY .  labetalol (NORMODYNE) 300 MG tablet, TAKE 2 TABLETS(600 MG) BY MOUTH TWICE DAILY (Patient taking differently: Takes 1 tablet twice a day.) .  lisinopril (ZESTRIL) 40 MG tablet, Take 1 tablet (40 mg total) by mouth daily.  Current Outpatient Medications (Respiratory):  .  cetirizine (ZYRTEC ALLERGY) 10 MG tablet, Take 1 tablet (10 mg total) by mouth daily. .  fluticasone (FLONASE) 50 MCG/ACT nasal spray, Place 2 sprays into both nostrils daily.  Current Outpatient Medications (Analgesics):  .  acetaminophen (TYLENOL) 500 MG tablet, Take 1,000 mg by mouth 3 (three) times daily as needed for moderate pain or headache.   Current Outpatient Medications (Other):  .  divalproex (DEPAKOTE) 500 MG DR tablet, Take 1 tablet (500 mg total) by mouth at bedtime. Marland Kitchen  esomeprazole (NEXIUM) 40 MG capsule, TAKE ONE CAPSULE(40 MG TOTAL) BY MOUTH TWICE DAILY .  gabapentin (NEURONTIN) 100 MG capsule, Take 2 capsules (200 mg total) by mouth at bedtime. .  Lurasidone HCl 60 MG TABS, Take 1 tablet (60 mg total) by mouth in the morning. .  Potassium Chloride ER 20 MEQ TBCR, Take 1  tablet by mouth daily. 1 tablet once a day   Reviewed prior external information including notes and imaging from  primary care provider As well as notes that were available from care everywhere and other healthcare systems.  Past medical history, social, surgical and family history all reviewed in electronic medical record.  No pertanent information unless stated regarding to the chief complaint.   Review of Systems:  No headache, visual changes, nausea, vomiting, diarrhea, constipation, dizziness, abdominal pain, skin rash, fevers, chills, night sweats, weight loss, swollen lymph nodes, body aches, joint swelling, chest pain, shortness of breath, mood changes. POSITIVE muscle aches  Objective  Blood pressure (!) 142/88, pulse 84, height 5\' 3"  (1.6 m), weight (!) 324 lb (147 kg), last menstrual period 05/02/2017, SpO2 95 %.   General: No apparent distress alert and oriented x3 mood and affect normal, dressed appropriately.  Morbidly obese patient does seem to be somewhat sad. HEENT: Pupils equal, extraocular movements intact  Respiratory: Patient's speak in full sentences and does not appear short of breath  Cardiovascular: 2+ lower extremity edema, non tender, no erythema  Gait antalgic  Diffuse tenderness to palpation in multiple different areas.  Patient does have tightness on the left shoulder girdle noted.  Patient's neck does have tightness as well.  Tightness noted in the thoracic spine as well.  Tightness noted in the paraspinal musculature of the lumbar spine.  Diffuse tenderness of multiple areas of the soft tissue as well.  Impression and Recommendations:     The above documentation has been reviewed and is accurate and complete Lyndal Pulley, DO

## 2020-08-25 ENCOUNTER — Encounter: Payer: Self-pay | Admitting: Family Medicine

## 2020-08-25 ENCOUNTER — Other Ambulatory Visit: Payer: Self-pay

## 2020-08-25 ENCOUNTER — Ambulatory Visit (INDEPENDENT_AMBULATORY_CARE_PROVIDER_SITE_OTHER): Payer: BC Managed Care – PPO | Admitting: Family Medicine

## 2020-08-25 VITALS — BP 142/88 | HR 84 | Ht 63.0 in | Wt 324.0 lb

## 2020-08-25 DIAGNOSIS — E213 Hyperparathyroidism, unspecified: Secondary | ICD-10-CM

## 2020-08-25 DIAGNOSIS — M545 Low back pain, unspecified: Secondary | ICD-10-CM | POA: Diagnosis not present

## 2020-08-25 DIAGNOSIS — M542 Cervicalgia: Secondary | ICD-10-CM | POA: Diagnosis not present

## 2020-08-25 DIAGNOSIS — M5136 Other intervertebral disc degeneration, lumbar region: Secondary | ICD-10-CM

## 2020-08-25 DIAGNOSIS — R635 Abnormal weight gain: Secondary | ICD-10-CM | POA: Diagnosis not present

## 2020-08-25 DIAGNOSIS — G8929 Other chronic pain: Secondary | ICD-10-CM | POA: Diagnosis not present

## 2020-08-25 MED ORDER — METHYLPREDNISOLONE ACETATE 80 MG/ML IJ SUSP
80.0000 mg | Freq: Once | INTRAMUSCULAR | Status: AC
  Administered 2020-08-25: 80 mg via INTRAMUSCULAR

## 2020-08-25 MED ORDER — KETOROLAC TROMETHAMINE 60 MG/2ML IM SOLN
60.0000 mg | Freq: Once | INTRAMUSCULAR | Status: AC
  Administered 2020-08-25: 60 mg via INTRAMUSCULAR

## 2020-08-25 NOTE — Assessment & Plan Note (Signed)
Patient has had intervention previously but continues to have a climbing calcium level.  Discussed need to discuss this with her primary care physician as well as her endocrinologist.

## 2020-08-25 NOTE — Assessment & Plan Note (Signed)
Multiple areas of chronic arthritic changes noted.  We discussed with patient about icing regimen and home exercises.  Discussed the importance of weight loss again.  Given injections of Toradol and Depo-Medrol today but I am hoping will be beneficial.  Discussed icing regimen follow-up again in 6 weeks

## 2020-08-25 NOTE — Patient Instructions (Signed)
  Keep working with PT No other changes in medicine Ask endocrinologist about parathyroid scan Continue Vit d  See me in 88 weeks or after endocrinologist

## 2020-08-25 NOTE — Assessment & Plan Note (Signed)
Continuing to work with weight and wellness.

## 2020-08-26 ENCOUNTER — Encounter (INDEPENDENT_AMBULATORY_CARE_PROVIDER_SITE_OTHER): Payer: Self-pay | Admitting: Family Medicine

## 2020-08-26 NOTE — Progress Notes (Signed)
Chief Complaint:   OBESITY Madison Palmer is here to discuss her progress with her obesity treatment plan along with follow-up of her obesity related diagnoses. Madison Palmer is on the Category 3 Plan and keeping a food journal and adhering to recommended goals of 1600-1700 calories and 100 g protein and states she is following her eating plan approximately 100% of the time. Madison Palmer states she is not currently exercising.  Today's visit was #: 57 Starting weight: 357 lbs Starting date: 02/27/2018 Today's weight: 323 lbs Today's date: 08/16/2020 Total lbs lost to date: 34 lbs Total lbs lost since last in-office visit: 0  Interim History: Calorie wise, Madison Palmer has been ~1850 cal and >100 g protein per day. She denies hunger. Pt has been following category 3 at breakfast and lunch, and trying to control calories and nutrition at dinner. She does feel like a failure with lack a weight loss. She is disappointed with weight gain.  Subjective:   1. Polyphagia Pt is still having increased drive to eat, particularly slightly increased calorie food. Not well controlled with Metformin.  2. Other specified hypothyroidism Pt's TSH 2.43. She is on levothyroxine 75 mcg daily. Pt denies cold or hot intolerances or palpitations.  3. At risk for side effect of medication Madison Palmer is at risk for side effects of medication due to starting GLP-1.  Assessment/Plan:   1. Polyphagia Intensive lifestyle modifications are the first line treatment for this issue. We discussed several lifestyle modifications today and she will continue to work on diet, exercise and weight loss efforts. Orders and follow up as documented in patient record. Start Ozempic 0.25 mg, as prescribed below.  Counseling . Polyphagia is excessive hunger. . Causes can include: low blood sugars, hypERthyroidism, PMS, lack of sleep, stress, insulin resistance, diabetes, certain medications, and diets that are deficient in protein and fiber.   -  Semaglutide,0.25 or 0.5MG /DOS, (OZEMPIC, 0.25 OR 0.5 MG/DOSE,) 2 MG/1.5ML SOPN; Inject 0.25 mg into the skin once a week.  Dispense: 1.5 mL; Refill: 0  2. Other specified hypothyroidism Patient with long-standing hypothyroidism, on levothyroxine therapy. She appears euthyroid. Orders and follow up as documented in patient record. Repeat labs in 2 months.  Counseling . Good thyroid control is important for overall health. Supratherapeutic thyroid levels are dangerous and will not improve weight loss results. . The correct way to take levothyroxine is fasting, with water, separated by at least 30 minutes from breakfast, and separated by more than 4 hours from calcium, iron, multivitamins, acid reflux medications (PPIs).   3. At risk for side effect of medication Madison Palmer was given approximately 15 minutes of drug side effect counseling today.  We discussed side effect possibility and risk versus benefits. Madison Palmer agreed to the medication and will contact this office if these side effects are intolerable.  Repetitive spaced learning was employed today to elicit superior memory formation and behavioral change.  4. Class 3 severe obesity with serious comorbidity and body mass index (BMI) of 60.0 to 69.9 in adult, unspecified obesity type (HCC) Madison Palmer is currently in the action stage of change. As such, her goal is to continue with weight loss efforts. She has agreed to the Category 3 Plan and keeping a food journal and adhering to recommended goals of up to 1600  calories and 90+ g protein.   Exercise goals: No exercise has been prescribed at this time.  Behavioral modification strategies: increasing lean protein intake, meal planning and cooking strategies and keeping a strict food journal.  Madison Palmer has agreed to follow-up with our clinic in 3 weeks. She was informed of the importance of frequent follow-up visits to maximize her success with intensive lifestyle modifications for her multiple health  conditions.   Objective:   Blood pressure 140/89, pulse 76, temperature 98.3 F (36.8 C), height 5\' 3"  (1.6 m), weight (!) 323 lb (146.5 kg), last menstrual period 05/02/2017, SpO2 98 %. Body mass index is 57.22 kg/m.  General: Cooperative, alert, well developed, in no acute distress. HEENT: Conjunctivae and lids unremarkable. Cardiovascular: Regular rhythm.  Lungs: Normal work of breathing. Neurologic: No focal deficits.   Lab Results  Component Value Date   CREATININE 1.01 (H) 06/10/2020   BUN 23 06/10/2020   NA 144 06/10/2020   K 4.0 06/10/2020   CL 102 06/10/2020   CO2 25 06/10/2020   Lab Results  Component Value Date   ALT 11 06/10/2020   AST 12 06/10/2020   ALKPHOS 90 06/10/2020   BILITOT 0.3 06/10/2020   Lab Results  Component Value Date   HGBA1C 5.6 06/01/2020   HGBA1C 5.7 (H) 12/11/2019   HGBA1C 5.7 (H) 12/24/2018   HGBA1C 6.0 (H) 02/27/2018   HGBA1C 5.7 09/10/2017   Lab Results  Component Value Date   INSULIN 18.9 06/01/2020   INSULIN 11.4 12/11/2019   INSULIN 21.1 12/30/2018   INSULIN 9.6 02/27/2018   Lab Results  Component Value Date   TSH 2.430 06/01/2020   Lab Results  Component Value Date   CHOL 216 (H) 06/10/2020   HDL 50 06/10/2020   LDLCALC 138 (H) 06/10/2020   TRIG 154 (H) 06/10/2020   CHOLHDL 5.6 (H) 12/24/2018   Lab Results  Component Value Date   WBC 5.4 09/25/2018   HGB 12.7 09/25/2018   HCT 40.4 09/25/2018   MCV 94.6 09/25/2018   PLT 229 09/25/2018   Lab Results  Component Value Date   IRON 44 02/20/2018   TIBC 347 05/29/2017    Attestation Statements:   Reviewed by clinician on day of visit: allergies, medications, problem list, medical history, surgical history, family history, social history, and previous encounter notes.  Coral Ceo, am acting as transcriptionist for Coralie Common, MD.   I have reviewed the above documentation for accuracy and completeness, and I agree with the above. - Jinny Blossom, MD

## 2020-08-30 ENCOUNTER — Encounter: Payer: Self-pay | Admitting: Physical Therapy

## 2020-08-30 ENCOUNTER — Ambulatory Visit: Payer: BC Managed Care – PPO | Attending: Family Medicine | Admitting: Physical Therapy

## 2020-08-30 ENCOUNTER — Telehealth (INDEPENDENT_AMBULATORY_CARE_PROVIDER_SITE_OTHER): Payer: Self-pay

## 2020-08-30 ENCOUNTER — Other Ambulatory Visit: Payer: Self-pay

## 2020-08-30 DIAGNOSIS — M545 Low back pain, unspecified: Secondary | ICD-10-CM | POA: Diagnosis not present

## 2020-08-30 DIAGNOSIS — R252 Cramp and spasm: Secondary | ICD-10-CM

## 2020-08-30 DIAGNOSIS — M542 Cervicalgia: Secondary | ICD-10-CM | POA: Diagnosis present

## 2020-08-30 DIAGNOSIS — R262 Difficulty in walking, not elsewhere classified: Secondary | ICD-10-CM

## 2020-08-30 DIAGNOSIS — M6281 Muscle weakness (generalized): Secondary | ICD-10-CM | POA: Insufficient documentation

## 2020-08-30 DIAGNOSIS — G8929 Other chronic pain: Secondary | ICD-10-CM | POA: Diagnosis present

## 2020-08-30 NOTE — Telephone Encounter (Signed)
2 attempts have been made to complete PA, and received notification from pt's pharmacy last week stating that pt cannot be located. Attempted to contact pt's insurance company to verify coverage of pharmacy benefits, but no automated options pertained to pharmacy benefits. Will contact pt's pharmacy and inquire about pharmacy [employer]chant [employer] officer.

## 2020-08-30 NOTE — Telephone Encounter (Signed)
Follow up on this please

## 2020-08-30 NOTE — Therapy (Signed)
[facility] [place], [place], [address] Phone: [phone]   Fax:  [phone]  Physical Therapy Treatment  Patient Details  Name: Madison Palmer MRN: [id number] Date of Birth: 1966/04/14 Referring Provider (PT): Esmeralda Arthur Date: 08/30/2020   PT End of Session - 08/30/20 1426    Visit Number 3    Date for PT Re-Evaluation 11/09/20    PT Start Time 6378    PT Stop Time 1435    PT Time Calculation (min) 50 min    Activity Tolerance Patient tolerated treatment well    Behavior During Therapy [facility] for tasks assessed/performed           Past Medical History:  Diagnosis Date  . Anemia   . Anxiety   . Arrhythmia    tachycardia  . Arthritis   . Chickenpox   . Depression   . Diverticulitis   . GERD (gastroesophageal reflux disease)   . Glaucoma   . Hyperlipidemia   . Hyperparathyroidism ([place])   . Hypertension   . Inflammatory polyps of colon ([place])   . LVH (left ventricular hypertrophy)   . Lymphedema   . PCOS (polycystic ovarian syndrome)   . Prediabetes   . Recurrent UTI   . Sleep apnea    CPAP  . Thyroid disease   . Vitamin D deficiency     Past Surgical History:  Procedure Laterality Date  . BREAST BIOPSY  2015  . CESAREAN SECTION  2004  . INNER EAR SURGERY     ear and sinus surgery  . LAPAROSCOPIC REPAIR AND REMOVAL OF GASTRIC BAND    . OOPHORECTOMY Left   . TONSILLECTOMY AND ADENOIDECTOMY      There were no vitals filed for this visit.   Subjective Assessment - 08/30/20 1354    Subjective No as bad as the other day    Currently in Pain? Yes    Pain Score 7     Pain Location --   Neck & shoulders                            [facility] Adult PT Treatment/Exercise - 08/30/20 0001      Lumbar Exercises: Aerobic   UBE (Upper Arm Bike) L1.3 x3 min each      Shoulder Exercises: Standing   External Rotation Both;20 reps;Theraband;Strengthening    Theraband Level  (Shoulder External Rotation) Level 1 (Yellow)    Flexion Weights;20 reps;Both;Strengthening    Shoulder Flexion Weight (lbs) 2    Extension Weights;20 reps;Both;Strengthening    Extension Weight (lbs) 5    Row Theraband;20 reps;Strengthening;Both      Moist Heat Therapy   Number Minutes Moist Heat 10 Minutes    Moist Heat Location Shoulder      Manual Therapy   Manual Therapy Soft tissue mobilization;Passive ROM    Soft tissue mobilization Cervical paraspinales, upper traps, rhomboids            Trigger Point Dry Needling - 08/30/20 0001    Consent Given? Yes    Education Handout Provided Yes    Muscles Treated Head and Neck Upper trapezius                  PT Short Term Goals - 08/30/20 1419      PT SHORT TERM GOAL #1   Title independent with initial HEP    Status Partially Met  PT Long Term Goals - 08/30/20 1419      PT LONG TERM GOAL #1   Title understand proper body mechanics for ADL's    Status Partially Met      PT LONG TERM GOAL #2   Title increase lumbar ROM 25%    Status On-going      PT LONG TERM GOAL #3   Title decrease pain 50%    Status On-going                 Plan - 08/30/20 1420    Clinical Impression Statement Pt able to complete all interventions. She reports activity makes her shoulder hurt more. Tactile cues to UE needed for appropriate arm positioning with external rotation. She did well keeping shoulder does with standing flexion and abduction. Some postural weakness noted with standing shoulder extension.    Personal Factors and Comorbidities Comorbidity 2    Comorbidities obesity, lymphedema    Examination-Activity Limitations Locomotion Level;Stand;Stairs    Examination-Participation Restrictions Community Activity;Interpersonal Relationship    Stability/Clinical Decision Making Stable/Uncomplicated    Rehab Potential Good    PT Frequency 2x / week    PT Duration 8 weeks    PT Treatment/Interventions  ADLs/Self Care Home Management;Cryotherapy;Electrical Stimulation;Moist Heat;Functional mobility training;Therapeutic activities;Therapeutic exercise;Balance training;Neuromuscular re-education;Manual techniques;Patient/family education;Dry needling;Gait training;Iontophoresis 42m/ml Dexamethasone    PT Next Visit Plan Multi joint interventions, functional strengthening/endurance ex's, manual/modalities as indicated           Patient will benefit from skilled therapeutic intervention in order to improve the following deficits and impairments:  Abnormal gait,Decreased range of motion,Difficulty walking,Increased muscle spasms,Pain,Impaired flexibility,Improper body mechanics,Decreased strength,Decreased mobility,Obesity,Cardiopulmonary status limiting activity,Decreased endurance  Visit Diagnosis: Chronic bilateral low back pain without sciatica  Neck pain  Cramp and spasm  Difficulty in walking, not elsewhere classified  Muscle weakness (generalized)     Problem List Patient Active Problem List   Diagnosis Date Noted  . Restrictive lung disease 08/11/2020  . Leg swelling 01/14/2019  . Elevated coronary artery calcium score 01/14/2019  . Chronic respiratory failure ([facility] 09/15/2018  . Acute on chronic respiratory failure with hypoxia and hypercapnia ([facility] 09/15/2018  . Disease of thyroid gland 09/15/2018  . Bipolar 1 disorder ([facility] 09/12/2018  . Prediabetes 09/05/2018  . Class 3 severe obesity with serious comorbidity and body mass index (BMI) of 50.0 to 59.9 in adult ([facility] 09/05/2018  . Bipolar affective disorder, manic, severe, with psychotic behavior ([facility] 08/28/2018  . Bipolar I disorder, current or most recent episode manic, with psychotic features ([facility]   . Adenomatous polyp 08/18/2018  . Educated about COVID-19 virus infection 08/15/2018  . Pulmonary vascular congestion 06/24/2018  . Chronic diastolic heart failure ([facility] 05/15/2018  . Dyspnea 05/15/2018  . Pulmonary  nodules 05/11/2018  . Hypertensive urgency 05/11/2018  . Chronic respiratory failure with hypoxia ([facility] 05/10/2018  . 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 DDD (degenerative disc disease), lumbar 03/21/2018  . Primary osteoarthritis of both knees 03/21/2018  . Dysphagia 03/19/2018  . PVC (premature ventricular contraction) 03/12/2018  . LVH (left ventricular hypertrophy) 03/12/2018  . Generalized edema 03/12/2018  . Bilateral carotid artery stenosis 03/12/2018  . Weight gain 01/25/2018  . Hemorrhoids 01/05/2018  . Generalized postprandial abdominal pain 01/05/2018  . Hiatal hernia 01/05/2018  . Esophageal dysmotilities 01/05/2018  . Mixed hyperlipidemia 09/12/2017  . Multinodular goiter 07/27/2017  . Hyperparathyroidism ([facility] 06/13/2017  . Vitamin D deficiency 06/13/2017  . History of colonic polyps 05/15/2017  .  Eustachian tube dysfunction, right 05/15/2017  . Lymphatic edema 05/15/2017  . Essential hypertension 05/14/2017  . Anemia 05/11/2017  . Arrhythmia 05/11/2017  . Diverticulosis 05/11/2017  . GERD (gastroesophageal reflux disease) 05/11/2017  . Hypothyroidism 05/11/2017  . Lower extremity edema 05/11/2017  . Morbid obesity ([place]) 05/11/2017  . Polycystic ovary syndrome 05/11/2017  . OSA on CPAP 05/11/2017  . Neck pain 05/11/2017  . Allergic rhinitis 05/11/2017  . 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 [place], [place], [address] Phone: [phone]   Fax:  [phone]  Name: Madison Palmer MRN: [id number] Date of Birth: 1965-12-08

## 2020-08-30 NOTE — Telephone Encounter (Signed)
For you -

## 2020-08-30 NOTE — Telephone Encounter (Signed)
PA has been initiated via [email or web address] for Ozempic.  Key: [id number] Ozempic (0.25 or 0.5 MG/DOSE) 2MG /1.5ML pen-injectors   Form: Charity fundraiser PA Form (2017 NCPDP) Determination: Wait for Determination Please wait for [employer] NCPDP 2017 to return a determination.  Your information has been submitted to [place]. To check for an updated outcome later, reopen this PA request from your dashboard.  If [employer] has not responded to your request within 24 hours, contact [place] at [phone]. If you think there may be a problem with your PA request, use our live chat feature at the bottom right

## 2020-08-30 NOTE — Telephone Encounter (Signed)
Contacted pt's pharmacy and the pharmacy benefits information was given below: Pharmacy Benefits Manager: [facility] YT:[id number] RX BIN: [id number] PCN: [id number] RX Group: [id number]

## 2020-08-30 NOTE — Patient Instructions (Signed)

## 2020-08-31 NOTE — Telephone Encounter (Signed)
PA for Ozempic has been denied by [employer], stating that it has been denied because patient does not have type 2 diabetes. Will make provider aware and pt is already aware and she has verbalized understanding of this.

## 2020-09-01 ENCOUNTER — Other Ambulatory Visit (INDEPENDENT_AMBULATORY_CARE_PROVIDER_SITE_OTHER): Payer: Self-pay

## 2020-09-01 DIAGNOSIS — Z8639 Personal history of other endocrine, nutritional and metabolic disease: Secondary | ICD-10-CM

## 2020-09-02 ENCOUNTER — Ambulatory Visit: Payer: BC Managed Care – PPO | Admitting: Physical Therapy

## 2020-09-02 LAB — RENAL FUNCTION PANEL
Albumin: 4.3 g/dL (ref 3.8–4.9)
BUN/Creatinine Ratio: 21 (ref 9–23)
BUN: 24 mg/dL (ref 6–24)
CO2: 24 mmol/L (ref 20–29)
Calcium: 10.7 mg/dL — ABNORMAL HIGH (ref 8.7–10.2)
Chloride: 102 mmol/L (ref 96–106)
Creatinine, Ser: 1.12 mg/dL — ABNORMAL HIGH (ref 0.57–1.00)
Glucose: 111 mg/dL — ABNORMAL HIGH (ref 65–99)
Phosphorus: 2.9 mg/dL — ABNORMAL LOW (ref 3.0–4.3)
Potassium: 4.4 mmol/L (ref 3.5–5.2)
Sodium: 142 mmol/L (ref 134–144)
eGFR: 58 mL/min/{1.73_m2} — ABNORMAL LOW (ref >59)

## 2020-09-02 LAB — PARATHYROID HORMONE, INTACT (NO CA): PTH: 68 pg/mL — ABNORMAL HIGH (ref 15–65)

## 2020-09-02 LAB — VITAMIN D 25 HYDROXY (VIT D DEFICIENCY, FRACTURES): Vit D, 25-Hydroxy: 29 ng/mL — ABNORMAL LOW (ref 30.0–100.0)

## 2020-09-02 LAB — TSH+FREE T4
Free T4: 1.67 ng/dL (ref 0.82–1.77)
TSH: 2.34 u[IU]/mL (ref 0.450–4.500)

## 2020-09-03 ENCOUNTER — Telehealth: Payer: Self-pay | Admitting: Cardiology

## 2020-09-03 MED ORDER — AMLODIPINE BESYLATE 5 MG PO TABS: 5.0000 mg | ORAL_TABLET | Freq: Every day | ORAL | 1 refills | Status: DC

## 2020-09-03 NOTE — Telephone Encounter (Signed)
*  STAT* If patient is at the pharmacy, call can be transferred to refill team.   1. Which medications need to be refilled? (please list name of each medication and dose if known)  amLODipine (NORVASC) 5 MG tablet  2. Which pharmacy/location (including street and city if local pharmacy) is medication to be sent to? [id number] - [place], [place] - [address]  3. Do they need a 30 day or 90 day supply?  90 day supply  

## 2020-09-06 ENCOUNTER — Other Ambulatory Visit: Payer: Self-pay

## 2020-09-06 ENCOUNTER — Encounter (INDEPENDENT_AMBULATORY_CARE_PROVIDER_SITE_OTHER): Payer: Self-pay | Admitting: Adult Health

## 2020-09-06 ENCOUNTER — Ambulatory Visit (INDEPENDENT_AMBULATORY_CARE_PROVIDER_SITE_OTHER): Payer: BC Managed Care – PPO | Admitting: Adult Health

## 2020-09-06 VITALS — BP 153/83 | HR 75 | Temp 98.4°F | Ht 63.0 in | Wt 327.0 lb

## 2020-09-06 DIAGNOSIS — R632 Polyphagia: Secondary | ICD-10-CM | POA: Diagnosis not present

## 2020-09-06 DIAGNOSIS — R7303 Prediabetes: Secondary | ICD-10-CM

## 2020-09-06 DIAGNOSIS — Z6841 Body Mass Index (BMI) 40.0 and over, adult: Secondary | ICD-10-CM

## 2020-09-06 DIAGNOSIS — E559 Vitamin D deficiency, unspecified: Secondary | ICD-10-CM

## 2020-09-06 DIAGNOSIS — E213 Hyperparathyroidism, unspecified: Secondary | ICD-10-CM

## 2020-09-06 DIAGNOSIS — Z9189 Other specified personal risk factors, not elsewhere classified: Secondary | ICD-10-CM

## 2020-09-06 MED ORDER — METFORMIN HCL 500 MG PO TABS: 500.0000 mg | ORAL_TABLET | Freq: Every day | ORAL | 0 refills | Status: DC

## 2020-09-07 DIAGNOSIS — R632 Polyphagia: Secondary | ICD-10-CM | POA: Insufficient documentation

## 2020-09-07 NOTE — Progress Notes (Signed)
Chief Complaint:   OBESITY Madison Palmer is here to discuss her progress with her obesity treatment plan along with follow-up of her obesity related diagnoses. Madison Palmer is on the Category 3 Plan and keeping a food journal and adhering to recommended goals of 1600 calories and 90 g protein and states she is following her eating plan approximately 75-80% of the time. Madison Palmer states she is not currently exercising.  Today's visit was #: 69 Starting weight: 357 lbs Starting date: 02/27/2018 Today's weight: 327 lbs Today's date: 09/06/2020 Total lbs lost to date: 30 Total lbs lost since last in-office visit: 0  Interim History: Madison Palmer was started on Ozempic 0.25 mg once a week, due to polyphagia despite daily Metformin 500 mg.  Insurance denied Ozempic. She has remained on daily Metformin. Pt's average daily intake 1827 cal/day and 90-110 grams protein/day.  Subjective:   1. Polyphagia Ozempic is not covered under pt's insurance plan. She has continued Metformin 500mg   QD. She denies diarrhea.  2. Vitamin D deficiency Madison Palmer's Vitamin D level was 29.0 on 09/01/2020, which is well below goal of 50.Marland Kitchen She is currently taking OTC vitamin D 5,000 IU each day. She denies nausea, vomiting or muscle weakness.  3. Hyperparathyroidism ([place]) 09/01/2020 parathyroid 68, calcium 10.7.  Madison Palmer is followed by [facility]. Her next OV is October 04, 2020.  Lab Results  Component Value Date   TSH 2.340 09/01/2020    4. Pre-diabetes 12/11/2019 A1c 5.7 and 06/01/2020 A1c 5.6 with elevated insulin level 18.9. Madison Palmer is on Metformin 500 mg QD and tolerating it well.  Lab Results  Component Value Date   HGBA1C 5.6 06/01/2020   Lab Results  Component Value Date   INSULIN 18.9 06/01/2020   INSULIN 11.4 12/11/2019   INSULIN 21.1 12/30/2018   INSULIN 9.6 02/27/2018   5. At risk for osteoporosis Madison Palmer is at higher risk of osteopenia and osteoporosis due to Vitamin D deficiency, insulin resistance, and  obesity.  Assessment/Plan:   1. Polyphagia Intensive lifestyle modifications are the first line treatment for this issue. We discussed several lifestyle modifications today and she will continue to work on diet, exercise and weight loss efforts. Orders and follow up as documented in patient record. Refill Metformin 500 mg QD with breakfast, Disp 90 tabs with 0 refills.  Counseling . Polyphagia is excessive hunger. . Causes can include: low blood sugars, hypERthyroidism, PMS, lack of sleep, stress, insulin resistance, diabetes, certain medications, and diets that are deficient in protein and fiber.   2. Vitamin D deficiency Low Vitamin D level contributes to fatigue and are associated with obesity, breast, and colon cancer. She agrees to continue to take OTC Vitamin D @5 ,000 IU daily and will follow-up for routine testing of Vitamin D, at least 2-3 times per year to avoid over-replacement.  3. Hyperparathyroidism ([place]) Patient with long-standing hypothyroidism, on levothyroxine therapy. She appears euthyroid. Orders and follow up as documented in patient record. Follow up with [facility].  Counseling . Good thyroid control is important for overall health. Supratherapeutic thyroid levels are dangerous and will not improve weight loss results. . The correct way to take levothyroxine is fasting, with water, separated by at least 30 minutes from breakfast, and separated by more than 4 hours from calcium, iron, multivitamins, acid reflux medications (PPIs).    4. Pre-diabetes Madison Palmer will continue to work on weight loss, exercise, and decreasing simple carbohydrates to help decrease the risk of diabetes.   - metFORMIN (GLUCOPHAGE) 500 MG tablet;  Take 1 tablet (500 mg total) by mouth daily with breakfast.  Dispense: 90 tablet; Refill: 0  5. At risk for osteoporosis Madison Palmer was given approximately 15 minutes of osteoporosis prevention counseling today. Latesia is at risk for osteopenia and  osteoporosis due to her Vitamin D deficiency. She was encouraged to take her Vitamin D and follow her higher calcium diet and increase strengthening exercise to help strengthen her bones and decrease her risk of osteopenia and osteoporosis.  Repetitive spaced learning was employed today to elicit superior memory formation and behavioral change.  6. Class 3 severe obesity with serious comorbidity and body mass index (BMI) of 50.0 to 59.9 in adult, unspecified obesity type (HCC)  Madison Palmer is currently in the action stage of change. As such, her goal is to continue with weight loss efforts. She has agreed to keeping a food journal and adhering to recommended goals of 1600 calories and 90 protein.   Consistently track all intake and include liquid calories.  Exercise goals: No exercise has been prescribed at this time.  Behavioral modification strategies: increasing lean protein intake, decreasing simple carbohydrates, meal planning and cooking strategies, planning for success and keeping a strict food journal.  Madison Palmer has agreed to follow-up with our clinic in 3 weeks. She was informed of the importance of frequent follow-up visits to maximize her success with intensive lifestyle modifications for her multiple health conditions.   Objective:   Blood pressure (!) 153/83, pulse 75, temperature 98.4 F (36.9 C), height 5\' 3"  (1.6 m), weight (!) 327 lb (148.3 kg), last menstrual period 05/02/2017, SpO2 98 %. Body mass index is 57.93 kg/m.  General: Cooperative, alert, well developed, in no acute distress. HEENT: Conjunctivae and lids unremarkable. Cardiovascular: Regular rhythm.  Lungs: Normal work of breathing. Neurologic: No focal deficits.   Lab Results  Component Value Date   CREATININE 1.12 (H) 09/01/2020   BUN 24 09/01/2020   NA 142 09/01/2020   K 4.4 09/01/2020   CL 102 09/01/2020   CO2 24 09/01/2020   Lab Results  Component Value Date   ALT 11 06/10/2020   AST 12 06/10/2020    ALKPHOS 90 06/10/2020   BILITOT 0.3 06/10/2020   Lab Results  Component Value Date   HGBA1C 5.6 06/01/2020   HGBA1C 5.7 (H) 12/11/2019   HGBA1C 5.7 (H) 12/24/2018   HGBA1C 6.0 (H) 02/27/2018   HGBA1C 5.7 09/10/2017   Lab Results  Component Value Date   INSULIN 18.9 06/01/2020   INSULIN 11.4 12/11/2019   INSULIN 21.1 12/30/2018   INSULIN 9.6 02/27/2018   Lab Results  Component Value Date   TSH 2.340 09/01/2020   Lab Results  Component Value Date   CHOL 216 (H) 06/10/2020   HDL 50 06/10/2020   LDLCALC 138 (H) 06/10/2020   TRIG 154 (H) 06/10/2020   CHOLHDL 5.6 (H) 12/24/2018   Lab Results  Component Value Date   WBC 5.4 09/25/2018   HGB 12.7 09/25/2018   HCT 40.4 09/25/2018   MCV 94.6 09/25/2018   PLT 229 09/25/2018   Lab Results  Component Value Date   IRON 44 02/20/2018   TIBC 347 05/29/2017    Attestation Statements:   Reviewed by clinician on day of visit: allergies, medications, problem list, medical history, surgical history, family history, social history, and previous encounter notes.  Coral Ceo, CMA, am acting as transcriptionist for Mina Marble, NP.  I have reviewed the above documentation for accuracy and completeness, and I agree with the  above. -  Cathern Tahir d. Nikitia Asbill, NP-C

## 2020-09-08 ENCOUNTER — Ambulatory Visit: Payer: BC Managed Care – PPO | Admitting: Physical Therapy

## 2020-09-08 ENCOUNTER — Encounter: Payer: Self-pay | Admitting: Physical Therapy

## 2020-09-08 ENCOUNTER — Other Ambulatory Visit: Payer: Self-pay

## 2020-09-08 DIAGNOSIS — R262 Difficulty in walking, not elsewhere classified: Secondary | ICD-10-CM

## 2020-09-08 DIAGNOSIS — G8929 Other chronic pain: Secondary | ICD-10-CM

## 2020-09-08 DIAGNOSIS — R252 Cramp and spasm: Secondary | ICD-10-CM

## 2020-09-08 DIAGNOSIS — M545 Low back pain, unspecified: Secondary | ICD-10-CM

## 2020-09-08 DIAGNOSIS — M6281 Muscle weakness (generalized): Secondary | ICD-10-CM

## 2020-09-08 DIAGNOSIS — M542 Cervicalgia: Secondary | ICD-10-CM

## 2020-09-08 NOTE — Therapy (Signed)
[address] [place], [place], [address] Phone: [phone]   Fax:  [phone]  Physical Therapy Treatment  Patient Details  Name: Madison Palmer MRN: [id number] Date of Birth: Sep 17, 1965 Referring Provider (PT): Esmeralda Arthur Date: 09/08/2020   PT End of Session - 09/08/20 1608    Visit Number 4    Date for PT Re-Evaluation 11/09/20    PT Start Time 9977    PT Stop Time 1615    PT Time Calculation (min) 41 min    Activity Tolerance Patient tolerated treatment well;Patient limited by pain    Behavior During Therapy [facility] for tasks assessed/performed           Past Medical History:  Diagnosis Date  . Anemia   . Anxiety   . Arrhythmia    tachycardia  . Arthritis   . Chickenpox   . Depression   . Diverticulitis   . GERD (gastroesophageal reflux disease)   . Glaucoma   . Hyperlipidemia   . Hyperparathyroidism ([place])   . Hypertension   . Inflammatory polyps of colon ([place])   . LVH (left ventricular hypertrophy)   . Lymphedema   . PCOS (polycystic ovarian syndrome)   . Prediabetes   . Recurrent UTI   . Sleep apnea    CPAP  . Thyroid disease   . Vitamin D deficiency     Past Surgical History:  Procedure Laterality Date  . BREAST BIOPSY  2015  . CESAREAN SECTION  2004  . INNER EAR SURGERY     ear and sinus surgery  . LAPAROSCOPIC REPAIR AND REMOVAL OF GASTRIC BAND    . OOPHORECTOMY Left   . TONSILLECTOMY AND ADENOIDECTOMY      There were no vitals filed for this visit.   Subjective Assessment - 09/08/20 1538    Subjective Pt reports LB and R knee is bothering her a lot more today.    Currently in Pain? Yes    Pain Score 7     Pain Location Other (Comment)   LB and R knee                            [facility] Adult PT Treatment/Exercise - 09/08/20 0001      Lumbar Exercises: Aerobic   UBE (Upper Arm Bike) L 1.5 x 3 min each    Nustep L3 x 6 min      Lumbar Exercises:  Seated   Other Seated Lumbar Exercises LAQ, marching, red tb knee flexion 2x10 B; heel/toe raises x15 B    Other Seated Lumbar Exercises rows, shoulder extension, shoulder ER yellow TB 2x15      Shoulder Exercises: Seated   Other Seated Exercises shoulder flexion x10 B yellow TB            Trigger Point Dry Needling - 09/08/20 0001    Consent Given? Yes    Muscles Treated Head and Neck Upper trapezius    Upper Trapezius Response Twitch reponse elicited;Palpable increased muscle length                  PT Short Term Goals - 08/30/20 1419      PT SHORT TERM GOAL #1   Title independent with initial HEP    Status Partially Met             PT Long Term Goals - 08/30/20 1419  PT LONG TERM GOAL #1   Title understand proper body mechanics for ADL's    Status Partially Met      PT LONG TERM GOAL #2   Title increase lumbar ROM 25%    Status On-going      PT LONG TERM GOAL #3   Title decrease pain 50%    Status On-going                 Plan - 09/08/20 1608    Clinical Impression Statement Pt presents to clinic reporting increase in LBP and knee pain today. Very limited tolerance to TE and standing ex's this rx. Performed most knee/lumbar and shoulder strengthening ex's in seated d/t increased pain in LB and knee with standing. Requested DN again for UT; states that this was very helpful last time and feels it has reduced neck pain. Continue to progress to tolerance.    PT Treatment/Interventions ADLs/Self Care Home Management;Cryotherapy;Electrical Stimulation;Moist Heat;Functional mobility training;Therapeutic activities;Therapeutic exercise;Balance training;Neuromuscular re-education;Manual techniques;Patient/family education;Dry needling;Gait training;Iontophoresis 53m/ml Dexamethasone    PT Next Visit Plan Multi joint interventions, functional strengthening/endurance ex's, manual/modalities as indicated    Consulted and Agree with Plan of Care Patient            Patient will benefit from skilled therapeutic intervention in order to improve the following deficits and impairments:  Abnormal gait,Decreased range of motion,Difficulty walking,Increased muscle spasms,Pain,Impaired flexibility,Improper body mechanics,Decreased strength,Decreased mobility,Obesity,Cardiopulmonary status limiting activity,Decreased endurance  Visit Diagnosis: Chronic bilateral low back pain without sciatica  Neck pain  Cramp and spasm  Difficulty in walking, not elsewhere classified  Muscle weakness (generalized)     Problem List Patient Active Problem List   Diagnosis Date Noted  . Polyphagia 09/07/2020  . Restrictive lung disease 08/11/2020  . Leg swelling 01/14/2019  . Elevated coronary artery calcium score 01/14/2019  . Chronic respiratory failure ([place] 09/15/2018  . Acute on chronic respiratory failure with hypoxia and hypercapnia ([place] 09/15/2018  . Disease of thyroid gland 09/15/2018  . Bipolar 1 disorder ([place] 09/12/2018  . Prediabetes 09/05/2018  . Class 3 severe obesity with serious comorbidity and body mass index (BMI) of 50.0 to 59.9 in adult ([place] 09/05/2018  . Bipolar affective disorder, manic, severe, with psychotic behavior ([place] 08/28/2018  . Bipolar I disorder, current or most recent episode manic, with psychotic features ([place]   . Adenomatous polyp 08/18/2018  . Educated about COVID-19 virus infection 08/15/2018  . Pulmonary vascular congestion 06/24/2018  . Chronic diastolic heart failure ([place] 05/15/2018  . Dyspnea 05/15/2018  . Pulmonary nodules 05/11/2018  . Hypertensive urgency 05/11/2018  . Chronic respiratory failure with hypoxia ([place] 05/10/2018  . 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 DDD (degenerative disc disease), lumbar 03/21/2018  . Primary osteoarthritis of both knees 03/21/2018  . Dysphagia 03/19/2018  . PVC (premature ventricular contraction) 03/12/2018  . LVH (left ventricular  hypertrophy) 03/12/2018  . Generalized edema 03/12/2018  . Bilateral carotid artery stenosis 03/12/2018  . Weight gain 01/25/2018  . Hemorrhoids 01/05/2018  . Generalized postprandial abdominal pain 01/05/2018  . Hiatal hernia 01/05/2018  . Esophageal dysmotilities 01/05/2018  . Mixed hyperlipidemia 09/12/2017  . Multinodular goiter 07/27/2017  . Hyperparathyroidism ([place] 06/13/2017  . Vitamin D deficiency 06/13/2017  . History of colonic polyps 05/15/2017  . Eustachian tube dysfunction, right 05/15/2017  . Lymphatic edema 05/15/2017  . Essential hypertension 05/14/2017  . Anemia 05/11/2017  . Arrhythmia 05/11/2017  . Diverticulosis 05/11/2017  . GERD (gastroesophageal reflux disease) 05/11/2017  .  Hypothyroidism 05/11/2017  . Lower extremity edema 05/11/2017  . Morbid obesity ([place]) 05/11/2017  . Polycystic ovary syndrome 05/11/2017  . OSA on CPAP 05/11/2017  . Neck pain 05/11/2017  . Allergic rhinitis 05/11/2017  . 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 [place], [place], [address] Phone: [phone]   Fax:  [phone]  Name: SAJE GALLOP MRN: [id number] Date of Birth: [birth year]

## 2020-09-10 ENCOUNTER — Encounter: Payer: Self-pay | Admitting: Physical Therapy

## 2020-09-10 ENCOUNTER — Other Ambulatory Visit: Payer: Self-pay

## 2020-09-10 ENCOUNTER — Ambulatory Visit: Payer: BC Managed Care – PPO | Admitting: Physical Therapy

## 2020-09-10 DIAGNOSIS — M545 Low back pain, unspecified: Secondary | ICD-10-CM

## 2020-09-10 DIAGNOSIS — M542 Cervicalgia: Secondary | ICD-10-CM

## 2020-09-10 DIAGNOSIS — R262 Difficulty in walking, not elsewhere classified: Secondary | ICD-10-CM

## 2020-09-10 DIAGNOSIS — R252 Cramp and spasm: Secondary | ICD-10-CM

## 2020-09-10 DIAGNOSIS — G8929 Other chronic pain: Secondary | ICD-10-CM

## 2020-09-10 NOTE — Therapy (Signed)
[address] [place], [place], [address] Phone: [phone]   Fax:  [phone]  Physical Therapy Treatment  Patient Details  Name: Madison Palmer MRN: [id number] Date of Birth: Sep 23, 1965 Referring Provider (PT): Esmeralda Arthur Date: 09/10/2020   PT End of Session - 09/10/20 0956    Visit Number 5    Date for PT Re-Evaluation 11/09/20    PT Start Time 0935    PT Stop Time 1015    PT Time Calculation (min) 40 min    Activity Tolerance Patient tolerated treatment well;Patient limited by pain    Behavior During Therapy [facility] for tasks assessed/performed           Past Medical History:  Diagnosis Date  . Anemia   . Anxiety   . Arrhythmia    tachycardia  . Arthritis   . Chickenpox   . Depression   . Diverticulitis   . GERD (gastroesophageal reflux disease)   . Glaucoma   . Hyperlipidemia   . Hyperparathyroidism ([place])   . Hypertension   . Inflammatory polyps of colon ([place])   . LVH (left ventricular hypertrophy)   . Lymphedema   . PCOS (polycystic ovarian syndrome)   . Prediabetes   . Recurrent UTI   . Sleep apnea    CPAP  . Thyroid disease   . Vitamin D deficiency     Past Surgical History:  Procedure Laterality Date  . BREAST BIOPSY  2015  . CESAREAN SECTION  2004  . INNER EAR SURGERY     ear and sinus surgery  . LAPAROSCOPIC REPAIR AND REMOVAL OF GASTRIC BAND    . OOPHORECTOMY Left   . TONSILLECTOMY AND ADENOIDECTOMY      There were no vitals filed for this visit.   Subjective Assessment - 09/10/20 0936    Subjective Knees are bothering her a little bit today    Currently in Pain? No/denies                             [facility] Adult PT Treatment/Exercise - 09/10/20 0001      Lumbar Exercises: Aerobic   UBE (Upper Arm Bike) L 1.7 x 3 min each    Nustep L3 x 1.5 min stopped due to knee pain      Lumbar Exercises: Standing   Row Theraband;Both;Strengthening;20  reps    Theraband Level (Row) Level 3 (Green)    Shoulder Extension Theraband;Strengthening;Both    Theraband Level (Shoulder Extension) Level 3 (Green)    Other Standing Lumbar Exercises shrugs 5lb 2x10      Lumbar Exercises: Seated   Other Seated Lumbar Exercises HS curls green 2x15      Manual Therapy   Manual Therapy Soft tissue mobilization;Passive ROM    Soft tissue mobilization Cervical paraspinales, upper traps, rhomboids            Trigger Point Dry Needling - 09/10/20 0001    Consent Given? Yes    Upper Trapezius Response Twitch reponse elicited;Palpable increased muscle length                  PT Short Term Goals - 08/30/20 1419      PT SHORT TERM GOAL #1   Title independent with initial HEP    Status Partially Met             PT Long Term Goals - 08/30/20 1419  PT LONG TERM GOAL #1   Title understand proper body mechanics for ADL's    Status Partially Met      PT LONG TERM GOAL #2   Title increase lumbar ROM 25%    Status On-going      PT LONG TERM GOAL #3   Title decrease pain 50%    Status On-going                 Plan - 09/10/20 0958    Clinical Impression Statement Pt was limited by knee pain, discontinued NuStep due to increase pain. Limited standing tolerance today as well during standing rows and extensions. Continues to report improvement with DN and requested them today for upper traps.    Personal Factors and Comorbidities Comorbidity 2    Comorbidities obesity, lymphedema    Examination-Activity Limitations Locomotion Level;Stand;Stairs    Examination-Participation Restrictions Community Activity;Interpersonal Relationship    Stability/Clinical Decision Making Stable/Uncomplicated    Rehab Potential Poor    PT Frequency 2x / week    PT Duration 8 weeks    PT Next Visit Plan Multi joint interventions, functional strengthening/endurance ex's, manual/modalities as indicated           Patient will benefit from  skilled therapeutic intervention in order to improve the following deficits and impairments:  Abnormal gait,Decreased range of motion,Difficulty walking,Increased muscle spasms,Pain,Impaired flexibility,Improper body mechanics,Decreased strength,Decreased mobility,Obesity,Cardiopulmonary status limiting activity,Decreased endurance  Visit Diagnosis: Neck pain  Cramp and spasm  Difficulty in walking, not elsewhere classified  Chronic bilateral low back pain without sciatica     Problem List Patient Active Problem List   Diagnosis Date Noted  . Polyphagia 09/07/2020  . Restrictive lung disease 08/11/2020  . Leg swelling 01/14/2019  . Elevated coronary artery calcium score 01/14/2019  . Chronic respiratory failure ([place]) 09/15/2018  . Acute on chronic respiratory failure with hypoxia and hypercapnia ([place]) 09/15/2018  . Disease of thyroid gland 09/15/2018  . Bipolar 1 disorder ([place]) 09/12/2018  . Prediabetes 09/05/2018  . Class 3 severe obesity with serious comorbidity and body mass index (BMI) of 50.0 to 59.9 in adult ([place]) 09/05/2018  . Bipolar affective disorder, manic, severe, with psychotic behavior ([place]) 08/28/2018  . Bipolar I disorder, current or most recent episode manic, with psychotic features ([place])   . Adenomatous polyp 08/18/2018  . Educated about COVID-19 virus infection 08/15/2018  . Pulmonary vascular congestion 06/24/2018  . Chronic diastolic heart failure ([place]) 05/15/2018  . Dyspnea 05/15/2018  . Pulmonary nodules 05/11/2018  . Hypertensive urgency 05/11/2018  . Chronic respiratory failure with hypoxia ([place]) 05/10/2018  . 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 DDD (degenerative disc disease), lumbar 03/21/2018  . Primary osteoarthritis of both knees 03/21/2018  . Dysphagia 03/19/2018  . PVC (premature ventricular contraction) 03/12/2018  . LVH (left ventricular hypertrophy) 03/12/2018  . Generalized edema 03/12/2018  . Bilateral  carotid artery stenosis 03/12/2018  . Weight gain 01/25/2018  . Hemorrhoids 01/05/2018  . Generalized postprandial abdominal pain 01/05/2018  . Hiatal hernia 01/05/2018  . Esophageal dysmotilities 01/05/2018  . Mixed hyperlipidemia 09/12/2017  . Multinodular goiter 07/27/2017  . Hyperparathyroidism ([place]) 06/13/2017  . Vitamin D deficiency 06/13/2017  . History of colonic polyps 05/15/2017  . Eustachian tube dysfunction, right 05/15/2017  . Lymphatic edema 05/15/2017  . Essential hypertension 05/14/2017  . Anemia 05/11/2017  . Arrhythmia 05/11/2017  . Diverticulosis 05/11/2017  . GERD (gastroesophageal reflux disease) 05/11/2017  . Hypothyroidism 05/11/2017  . Lower extremity edema 05/11/2017  .  Morbid obesity ([place]) 05/11/2017  . Polycystic ovary syndrome 05/11/2017  . OSA on CPAP 05/11/2017  . Neck pain 05/11/2017  . Allergic rhinitis 05/11/2017  . 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 [place], [place], [address] Phone: [phone]   Fax:  [phone]  Name: VERANIA SALBERG MRN: [id number] Date of Birth: 08-25-1965

## 2020-09-13 ENCOUNTER — Encounter: Payer: Self-pay | Admitting: Physical Therapy

## 2020-09-13 ENCOUNTER — Ambulatory Visit: Payer: BC Managed Care – PPO | Admitting: Physical Therapy

## 2020-09-13 ENCOUNTER — Other Ambulatory Visit (INDEPENDENT_AMBULATORY_CARE_PROVIDER_SITE_OTHER): Payer: Self-pay | Admitting: Family Medicine

## 2020-09-13 ENCOUNTER — Other Ambulatory Visit: Payer: Self-pay

## 2020-09-13 DIAGNOSIS — E038 Other specified hypothyroidism: Secondary | ICD-10-CM

## 2020-09-13 DIAGNOSIS — R252 Cramp and spasm: Secondary | ICD-10-CM

## 2020-09-13 DIAGNOSIS — M545 Low back pain, unspecified: Secondary | ICD-10-CM | POA: Diagnosis not present

## 2020-09-13 DIAGNOSIS — M542 Cervicalgia: Secondary | ICD-10-CM

## 2020-09-13 NOTE — Telephone Encounter (Signed)
Last seen Madison Palmer 

## 2020-09-13 NOTE — Therapy (Signed)
[place] [place], [place], [address] Phone: [phone]   Fax:  [phone]  Physical Therapy Treatment  Patient Details  Name: Madison Palmer MRN: [id number] Date of Birth: June 21, 1965 Referring Provider (PT): Esmeralda Arthur Date: 09/13/2020   PT End of Session - 09/13/20 1416    Visit Number 6    Date for PT Re-Evaluation 11/09/20    PT Start Time 3212    PT Stop Time 1432    PT Time Calculation (min) 34 min    Activity Tolerance Patient tolerated treatment well    Behavior During Therapy [facility] for tasks assessed/performed           Past Medical History:  Diagnosis Date  . Anemia   . Anxiety   . Arrhythmia    tachycardia  . Arthritis   . Chickenpox   . Depression   . Diverticulitis   . GERD (gastroesophageal reflux disease)   . Glaucoma   . Hyperlipidemia   . Hyperparathyroidism ([place])   . Hypertension   . Inflammatory polyps of colon ([place])   . LVH (left ventricular hypertrophy)   . Lymphedema   . PCOS (polycystic ovarian syndrome)   . Prediabetes   . Recurrent UTI   . Sleep apnea    CPAP  . Thyroid disease   . Vitamin D deficiency     Past Surgical History:  Procedure Laterality Date  . BREAST BIOPSY  2015  . CESAREAN SECTION  2004  . INNER EAR SURGERY     ear and sinus surgery  . LAPAROSCOPIC REPAIR AND REMOVAL OF GASTRIC BAND    . OOPHORECTOMY Left   . TONSILLECTOMY AND ADENOIDECTOMY      There were no vitals filed for this visit.   Subjective Assessment - 09/13/20 1402    Subjective knee is  not as bad today, Cant complain, Shoulder hurt when she tries to sleep in certain positions    Currently in Pain? Yes    Pain Score 2     Pain Location Neck                             [facility] Adult PT Treatment/Exercise - 09/13/20 0001      Lumbar Exercises: Aerobic   UBE (Upper Arm Bike) L 1.7 x 3 min each      Lumbar Exercises: Standing   Row  Theraband;Both;Strengthening;15 reps   x2   Theraband Level (Row) Level 3 (Green)    Shoulder Extension Theraband;Strengthening;Both;15 reps   x2   Theraband Level (Shoulder Extension) Level 3 (Green)      Moist Heat Therapy   Number Minutes Moist Heat 10 Minutes    Moist Heat Location Shoulder      Manual Therapy   Manual Therapy Soft tissue mobilization;Passive ROM    Soft tissue mobilization Cervical paraspinales, upper traps, rhomboids            Trigger Point Dry Needling - 09/13/20 0001    Consent Given? Yes    Muscles Treated Head and Neck Upper trapezius    Upper Trapezius Response Twitch reponse elicited;Palpable increased muscle length                  PT Short Term Goals - 08/30/20 1419      PT SHORT TERM GOAL #1   Title independent with initial HEP    Status Partially Met  PT Long Term Goals - 08/30/20 1419      PT LONG TERM GOAL #1   Title understand proper body mechanics for ADL's    Status Partially Met      PT LONG TERM GOAL #2   Title increase lumbar ROM 25%    Status On-going      PT LONG TERM GOAL #3   Title decrease pain 50%    Status On-going                 Plan - 09/13/20 1417    Clinical Impression Statement Pt  13 minutes late. She reports increase pain around the inferior angle of L scapula with shoulder extensions. R scapular pain at inferior angle reported with reverse on UBE. No issus with standing rows. Some pain and tenderness in the upper traps and rhomboid area.    Personal Factors and Comorbidities Comorbidity 2    Comorbidities obesity, lymphedema    Examination-Activity Limitations Locomotion Level;Stand;Stairs    Examination-Participation Restrictions Community Activity;Interpersonal Relationship    Stability/Clinical Decision Making Stable/Uncomplicated    Rehab Potential Poor    PT Frequency 2x / week    PT Duration 8 weeks    PT Treatment/Interventions ADLs/Self Care Home  Management;Cryotherapy;Electrical Stimulation;Moist Heat;Functional mobility training;Therapeutic activities;Therapeutic exercise;Balance training;Neuromuscular re-education;Manual techniques;Patient/family education;Dry needling;Gait training;Iontophoresis 27m/ml Dexamethasone    PT Next Visit Plan Multi joint interventions, functional strengthening/endurance ex's, manual/modalities as indicated           Patient will benefit from skilled therapeutic intervention in order to improve the following deficits and impairments:  Abnormal gait,Decreased range of motion,Difficulty walking,Increased muscle spasms,Pain,Impaired flexibility,Improper body mechanics,Decreased strength,Decreased mobility,Obesity,Cardiopulmonary status limiting activity,Decreased endurance  Visit Diagnosis: Neck pain  Cramp and spasm     Problem List Patient Active Problem List   Diagnosis Date Noted  . Polyphagia 09/07/2020  . Restrictive lung disease 08/11/2020  . Leg swelling 01/14/2019  . Elevated coronary artery calcium score 01/14/2019  . Chronic respiratory failure (HD[place] 09/15/2018  . Acute on chronic respiratory failure with hypoxia and hypercapnia (HBoligee 09/15/2018  . Disease of thyroid gland 09/15/2018  . Bipolar 1 disorder ([place] 09/12/2018  . Prediabetes 09/05/2018  . Class 3 severe obesity with serious comorbidity and body mass index (BMI) of 50.0 to 59.9 in adult (HVilla Heights 09/05/2018  . Bipolar affective disorder, manic, severe, with psychotic behavior (H[place] 08/28/2018  . Bipolar I disorder, current or most recent episode manic, with psychotic features (HOp[place]   . Adenomatous polyp 08/18/2018  . Educated about COVID-19 virus infection 08/15/2018  . Pulmonary vascular congestion 06/24/2018  . Chronic diastolic heart failure ([place] 05/15/2018  . Dyspnea 05/15/2018  . Pulmonary nodules 05/11/2018  . Hypertensive urgency 05/11/2018  . Chronic respiratory failure with hypoxia (HT[place] 05/10/2018  .  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HH[place] 04/26/2018  . DDD (degenerative disc disease), lumbar 03/21/2018  . Primary osteoarthritis of both knees 03/21/2018  . Dysphagia 03/19/2018  . PVC (premature ventricular contraction) 03/12/2018  . LVH (left ventricular hypertrophy) 03/12/2018  . Generalized edema 03/12/2018  . Bilateral carotid artery stenosis 03/12/2018  . Weight gain 01/25/2018  . Hemorrhoids 01/05/2018  . Generalized postprandial abdominal pain 01/05/2018  . Hiatal hernia 01/05/2018  . Esophageal dysmotilities 01/05/2018  . Mixed hyperlipidemia 09/12/2017  . Multinodular goiter 07/27/2017  . Hyperparathyroidism (HMilled[place] 06/13/2017  . Vitamin D deficiency 06/13/2017  . History of colonic polyps 05/15/2017  . Eustachian tube dysfunction, right 05/15/2017  . Lymphatic edema 05/15/2017  . Essential hypertension  05/14/2017  . Anemia 05/11/2017  . Arrhythmia 05/11/2017  . Diverticulosis 05/11/2017  . GERD (gastroesophageal reflux disease) 05/11/2017  . Hypothyroidism 05/11/2017  . Lower extremity edema 05/11/2017  . Morbid obesity ([place]) 05/11/2017  . Polycystic ovary syndrome 05/11/2017  . OSA on CPAP 05/11/2017  . Neck pain 05/11/2017  . Allergic rhinitis 05/11/2017  . Ossification of posterior longitudinal ligament ([place]) 08/28/2012    Scot Jun 09/13/2020, 2:25 PM  [place]. [place], [place], [address] Phone: [phone]   Fax:  [phone]  Name: Madison Palmer MRN: [id number] Date of Birth: [birth year]

## 2020-09-14 MED ORDER — LEVOTHYROXINE SODIUM 75 MCG PO TABS
75.0000 ug | ORAL_TABLET | Freq: Every day | ORAL | 0 refills | Status: DC
Start: 2020-09-14 — End: 2020-12-07

## 2020-09-14 NOTE — Telephone Encounter (Signed)
Refill request

## 2020-09-15 ENCOUNTER — Ambulatory Visit: Payer: BC Managed Care – PPO | Admitting: Physical Therapy

## 2020-09-15 ENCOUNTER — Encounter: Payer: Self-pay | Admitting: Physical Therapy

## 2020-09-15 ENCOUNTER — Other Ambulatory Visit: Payer: Self-pay

## 2020-09-15 DIAGNOSIS — M545 Low back pain, unspecified: Secondary | ICD-10-CM | POA: Diagnosis not present

## 2020-09-15 DIAGNOSIS — R252 Cramp and spasm: Secondary | ICD-10-CM

## 2020-09-15 DIAGNOSIS — G8929 Other chronic pain: Secondary | ICD-10-CM

## 2020-09-15 DIAGNOSIS — M542 Cervicalgia: Secondary | ICD-10-CM

## 2020-09-15 DIAGNOSIS — R262 Difficulty in walking, not elsewhere classified: Secondary | ICD-10-CM

## 2020-09-15 NOTE — Therapy (Signed)
[address]. [place], [place], [address] Phone: [phone]   Fax:  [phone]  Physical Therapy Treatment  Patient Details  Name: Madison Palmer MRN: [id number] Date of Birth: 1965/10/30 Referring Provider (PT): Esmeralda Arthur Date: 09/15/2020   PT End of Session - 09/15/20 1423    Visit Number 7    Date for PT Re-Evaluation 11/09/20    PT Start Time 8315    PT Stop Time 1433    PT Time Calculation (min) 35 min    Activity Tolerance Patient tolerated treatment well    Behavior During Therapy [facility] for tasks assessed/performed           Past Medical History:  Diagnosis Date  . Anemia   . Anxiety   . Arrhythmia    tachycardia  . Arthritis   . Chickenpox   . Depression   . Diverticulitis   . GERD (gastroesophageal reflux disease)   . Glaucoma   . Hyperlipidemia   . Hyperparathyroidism ([place])   . Hypertension   . Inflammatory polyps of colon ([place])   . LVH (left ventricular hypertrophy)   . Lymphedema   . PCOS (polycystic ovarian syndrome)   . Prediabetes   . Recurrent UTI   . Sleep apnea    CPAP  . Thyroid disease   . Vitamin D deficiency     Past Surgical History:  Procedure Laterality Date  . BREAST BIOPSY  2015  . CESAREAN SECTION  2004  . INNER EAR SURGERY     ear and sinus surgery  . LAPAROSCOPIC REPAIR AND REMOVAL OF GASTRIC BAND    . OOPHORECTOMY Left   . TONSILLECTOMY AND ADENOIDECTOMY      There were no vitals filed for this visit.   Subjective Assessment - 09/15/20 1359    Subjective Don't feel it until she sleeping, more stiff today than she is sore    Currently in Pain? Yes    Pain Score 5     Pain Location --   neck and shoulders                            [facility] Adult PT Treatment/Exercise - 09/15/20 0001      Exercises   Exercises Neck      Neck Exercises: Seated   Neck Retraction 20 reps;3 secs    Shoulder Flexion 10 reps;Both;5 reps;Weights     Shoulder Flexion Weights (lbs) 2    Shoulder ABduction Both;5 reps;Weights   x2   Shoulder Abduction Weights (lbs) 2    Other Seated Exercise ER yellow tband 2x10    Other Seated Exercise Shrugs 4lb 2x10      Lumbar Exercises: Aerobic   UBE (Upper Arm Bike) L 1 x 3 min each      Moist Heat Therapy   Number Minutes Moist Heat 10 Minutes    Moist Heat Location Shoulder                    PT Short Term Goals - 08/30/20 1419      PT SHORT TERM GOAL #1   Title independent with initial HEP    Status Partially Met             PT Long Term Goals - 08/30/20 1419      PT LONG TERM GOAL #1   Title understand proper body mechanics for ADL's  Status Partially Met      PT LONG TERM GOAL #2   Title increase lumbar ROM 25%    Status On-going      PT LONG TERM GOAL #3   Title decrease pain 50%    Status On-going                 Plan - 09/15/20 1424    Clinical Impression Statement Pt ~ 13 minutes late for today's session. Added new interventions UE and posterior cervical strengthening. Some pain and discomfort reported with cervical retractions. the first set of shoulder flexion did cause some pain as well but lessened as reps progressed. Cues not to bend elbows with shrugs.    Personal Factors and Comorbidities Comorbidity 2    Comorbidities obesity, lymphedema    Examination-Activity Limitations Locomotion Level;Stand;Stairs    Examination-Participation Restrictions Community Activity;Interpersonal Relationship    Stability/Clinical Decision Making Stable/Uncomplicated    Rehab Potential Poor    PT Frequency 2x / week    PT Duration 8 weeks    PT Treatment/Interventions ADLs/Self Care Home Management;Cryotherapy;Electrical Stimulation;Moist Heat;Functional mobility training;Therapeutic activities;Therapeutic exercise;Balance training;Neuromuscular re-education;Manual techniques;Patient/family education;Dry needling;Gait training;Iontophoresis 28m/ml  Dexamethasone    PT Next Visit Plan Multi joint interventions, functional strengthening/endurance ex's, manual/modalities as indicated           Patient will benefit from skilled therapeutic intervention in order to improve the following deficits and impairments:  Abnormal gait,Decreased range of motion,Difficulty walking,Increased muscle spasms,Pain,Impaired flexibility,Improper body mechanics,Decreased strength,Decreased mobility,Obesity,Cardiopulmonary status limiting activity,Decreased endurance  Visit Diagnosis: Cramp and spasm  Neck pain  Difficulty in walking, not elsewhere classified  Chronic bilateral low back pain without sciatica     Problem List Patient Active Problem List   Diagnosis Date Noted  . Polyphagia 09/07/2020  . Restrictive lung disease 08/11/2020  . Leg swelling 01/14/2019  . Elevated coronary artery calcium score 01/14/2019  . Chronic respiratory failure (HL[place] 09/15/2018  . Acute on chronic respiratory failure with hypoxia and hypercapnia (HShabbona 09/15/2018  . Disease of thyroid gland 09/15/2018  . Bipolar 1 disorder (HT[place] 09/12/2018  . Prediabetes 09/05/2018  . Class 3 severe obesity with serious comorbidity and body mass index (BMI) of 50.0 to 59.9 in adult (HEdge[place] 09/05/2018  . Bipolar affective disorder, manic, severe, with psychotic behavior (HPort St. John 08/28/2018  . Bipolar I disorder, current or most recent episode manic, with psychotic features (HE[place]   . Adenomatous polyp 08/18/2018  . Educated about COVID-19 virus infection 08/15/2018  . Pulmonary vascular congestion 06/24/2018  . Chronic diastolic heart failure ([facility] 05/15/2018  . Dyspnea 05/15/2018  . Pulmonary nodules 05/11/2018  . Hypertensive urgency 05/11/2018  . Chronic respiratory failure with hypoxia (HE[place] 05/10/2018  . 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HNow[place] 04/26/2018  . DDD (degenerative disc disease), lumbar 03/21/2018  . Primary osteoarthritis of both knees 03/21/2018  .  Dysphagia 03/19/2018  . PVC (premature ventricular contraction) 03/12/2018  . LVH (left ventricular hypertrophy) 03/12/2018  . Generalized edema 03/12/2018  . Bilateral carotid artery stenosis 03/12/2018  . Weight gain 01/25/2018  . Hemorrhoids 01/05/2018  . Generalized postprandial abdominal pain 01/05/2018  . Hiatal hernia 01/05/2018  . Esophageal dysmotilities 01/05/2018  . Mixed hyperlipidemia 09/12/2017  . Multinodular goiter 07/27/2017  . Hyperparathyroidism (HS[place] 06/13/2017  . Vitamin D deficiency 06/13/2017  . History of colonic polyps 05/15/2017  . Eustachian tube dysfunction, right 05/15/2017  . Lymphatic edema 05/15/2017  . Essential hypertension 05/14/2017  . Anemia 05/11/2017  . Arrhythmia 05/11/2017  . Diverticulosis  05/11/2017  . GERD (gastroesophageal reflux disease) 05/11/2017  . Hypothyroidism 05/11/2017  . Lower extremity edema 05/11/2017  . Morbid obesity ([place]) 05/11/2017  . Polycystic ovary syndrome 05/11/2017  . OSA on CPAP 05/11/2017  . Neck pain 05/11/2017  . Allergic rhinitis 05/11/2017  . Ossification of posterior longitudinal ligament ([place]) 08/28/2012    Scot Jun 09/15/2020, 2:29 PM  [address]. [place], [place], [address] Phone: [phone]   Fax:  [phone]  Name: Madison Palmer MRN: [id number] Date of Birth: [birth year]

## 2020-09-20 ENCOUNTER — Ambulatory Visit: Payer: BC Managed Care – PPO | Admitting: Physical Therapy

## 2020-09-21 ENCOUNTER — Encounter (INDEPENDENT_AMBULATORY_CARE_PROVIDER_SITE_OTHER): Payer: Self-pay | Admitting: Family Medicine

## 2020-09-22 ENCOUNTER — Encounter: Payer: Self-pay | Admitting: Physical Therapy

## 2020-09-22 ENCOUNTER — Ambulatory Visit: Payer: BC Managed Care – PPO | Admitting: Physical Therapy

## 2020-09-22 ENCOUNTER — Other Ambulatory Visit: Payer: Self-pay

## 2020-09-22 ENCOUNTER — Encounter: Payer: Self-pay | Admitting: Nurse Practitioner

## 2020-09-22 DIAGNOSIS — M545 Low back pain, unspecified: Secondary | ICD-10-CM | POA: Diagnosis not present

## 2020-09-22 NOTE — Therapy (Signed)
[place]. [place], [place], [address] Phone: [phone]   Fax:  [phone]  Physical Therapy Treatment  Patient Details  Name: Madison Palmer MRN: [id number] Date of Birth: 11/19/1965 Referring Provider (PT): Esmeralda Arthur Date: 09/22/2020   PT End of Session - 09/22/20 1423    Visit Number 8    Date for PT Re-Evaluation 11/09/20    PT Start Time 4401    PT Stop Time 1430    PT Time Calculation (min) 42 min    Activity Tolerance Patient tolerated treatment well    Behavior During Therapy [facility] for tasks assessed/performed           Past Medical History:  Diagnosis Date  . Anemia   . Anxiety   . Arrhythmia    tachycardia  . Arthritis   . Chickenpox   . Depression   . Diverticulitis   . GERD (gastroesophageal reflux disease)   . Glaucoma   . Hyperlipidemia   . Hyperparathyroidism ([place])   . Hypertension   . Inflammatory polyps of colon ([place])   . LVH (left ventricular hypertrophy)   . Lymphedema   . PCOS (polycystic ovarian syndrome)   . Prediabetes   . Recurrent UTI   . Sleep apnea    CPAP  . Thyroid disease   . Vitamin D deficiency     Past Surgical History:  Procedure Laterality Date  . BREAST BIOPSY  2015  . CESAREAN SECTION  2004  . INNER EAR SURGERY     ear and sinus surgery  . LAPAROSCOPIC REPAIR AND REMOVAL OF GASTRIC BAND    . OOPHORECTOMY Left   . TONSILLECTOMY AND ADENOIDECTOMY      There were no vitals filed for this visit.   Subjective Assessment - 09/22/20 1357    Subjective Was doing ok until yesterday, pt reports L side of neck was giving her a hard time    Currently in Pain? Yes    Pain Score 6     Pain Location Neck    Pain Orientation Left;Right                             [facility] Adult PT Treatment/Exercise - 09/22/20 0001      Neck Exercises: Seated   Neck Retraction 20 reps;3 secs    Shoulder Flexion 20 reps;Both;Weights    Shoulder  Flexion Weights (lbs) 1    Shoulder ABduction Both;20 reps    Shoulder Abduction Weights (lbs) 1    Other Seated Exercise ER yellow tband 2x10; Horiz Abd yellow 2x10      Lumbar Exercises: Aerobic   UBE (Upper Arm Bike) L 1 x 3 min each      Lumbar Exercises: Seated   Other Seated Lumbar Exercises Rows Green Tband 2x15      Moist Heat Therapy   Number Minutes Moist Heat 10 Minutes    Moist Heat Location Shoulder                    PT Short Term Goals - 08/30/20 1419      PT SHORT TERM GOAL #1   Title independent with initial HEP    Status Partially Met             PT Long Term Goals - 08/30/20 1419      PT LONG TERM GOAL #1   Title understand  proper body mechanics for ADL's    Status Partially Met      PT LONG TERM GOAL #2   Title increase lumbar ROM 25%    Status On-going      PT LONG TERM GOAL #3   Title decrease pain 50%    Status On-going                 Plan - 09/22/20 1423    Clinical Impression Statement Pt reports that she was doing well up until yesterday when she started having neck pain. She does not know how this occurred "maybe slept wrong".  She was able to complete the interventions but with increase tightness in posterior neck and shoulders. Some UE fatigue noted with flexion an abduction. Cues for arm placement with external rotation.    Personal Factors and Comorbidities Comorbidity 2    Comorbidities obesity, lymphedema    Examination-Activity Limitations Locomotion Level;Stand;Stairs    Examination-Participation Restrictions Community Activity;Interpersonal Relationship    Stability/Clinical Decision Making Stable/Uncomplicated    Rehab Potential Poor    PT Frequency 2x / week    PT Duration 8 weeks    PT Treatment/Interventions ADLs/Self Care Home Management;Cryotherapy;Electrical Stimulation;Moist Heat;Functional mobility training;Therapeutic activities;Therapeutic exercise;Balance training;Neuromuscular re-education;Manual  techniques;Patient/family education;Dry needling;Gait training;Iontophoresis 65m/ml Dexamethasone    PT Next Visit Plan Multi joint interventions, functional strengthening/endurance ex's, manual/modalities as indicated           Patient will benefit from skilled therapeutic intervention in order to improve the following deficits and impairments:  Abnormal gait,Decreased range of motion,Difficulty walking,Increased muscle spasms,Pain,Impaired flexibility,Improper body mechanics,Decreased strength,Decreased mobility,Obesity,Cardiopulmonary status limiting activity,Decreased endurance  Visit Diagnosis: Neck pain  Cramp and spasm     Problem List Patient Active Problem List   Diagnosis Date Noted  . Polyphagia 09/07/2020  . Restrictive lung disease 08/11/2020  . Leg swelling 01/14/2019  . Elevated coronary artery calcium score 01/14/2019  . Chronic respiratory failure (HPort [place] 09/15/2018  . Acute on chronic respiratory failure with hypoxia and hypercapnia (HRidgeville 09/15/2018  . Disease of thyroid gland 09/15/2018  . Bipolar 1 disorder (HMineral Point 09/12/2018  . Prediabetes 09/05/2018  . Class 3 severe obesity with serious comorbidity and body mass index (BMI) of 50.0 to 59.9 in adult (HFore[place] 09/05/2018  . Bipolar affective disorder, manic, severe, with psychotic behavior (HStat[place] 08/28/2018  . Bipolar I disorder, current or most recent episode manic, with psychotic features (HLeon   . Adenomatous polyp 08/18/2018  . Educated about COVID-19 virus infection 08/15/2018  . Pulmonary vascular congestion 06/24/2018  . Chronic diastolic heart failure (HP[place] 05/15/2018  . Dyspnea 05/15/2018  . Pulmonary nodules 05/11/2018  . Hypertensive urgency 05/11/2018  . Chronic respiratory failure with hypoxia (HWh[place] 05/10/2018  . 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HP[place] 04/26/2018  . DDD (degenerative disc disease), lumbar 03/21/2018  . Primary osteoarthritis of both knees 03/21/2018  . Dysphagia  03/19/2018  . PVC (premature ventricular contraction) 03/12/2018  . LVH (left ventricular hypertrophy) 03/12/2018  . Generalized edema 03/12/2018  . Bilateral carotid artery stenosis 03/12/2018  . Weight gain 01/25/2018  . Hemorrhoids 01/05/2018  . Generalized postprandial abdominal pain 01/05/2018  . Hiatal hernia 01/05/2018  . Esophageal dysmotilities 01/05/2018  . Mixed hyperlipidemia 09/12/2017  . Multinodular goiter 07/27/2017  . Hyperparathyroidism (HNuang[place] 06/13/2017  . Vitamin D deficiency 06/13/2017  . History of colonic polyps 05/15/2017  . Eustachian tube dysfunction, right 05/15/2017  . Lymphatic edema 05/15/2017  . Essential hypertension 05/14/2017  . Anemia 05/11/2017  . Arrhythmia 05/11/2017  .  Diverticulosis 05/11/2017  . GERD (gastroesophageal reflux disease) 05/11/2017  . Hypothyroidism 05/11/2017  . Lower extremity edema 05/11/2017  . Morbid obesity ([place]) 05/11/2017  . Polycystic ovary syndrome 05/11/2017  . OSA on CPAP 05/11/2017  . Neck pain 05/11/2017  . Allergic rhinitis 05/11/2017  . Ossification of posterior longitudinal ligament ([place]) 08/28/2012    Scot Jun 09/22/2020, 2:28 PM  [place]. [place], [place], [address] Phone: [phone]   Fax:  [phone]  Name: Madison Palmer MRN: [id number] Date of Birth: Apr 06, 1966

## 2020-09-23 ENCOUNTER — Other Ambulatory Visit: Payer: Self-pay

## 2020-09-23 DIAGNOSIS — E782 Mixed hyperlipidemia: Secondary | ICD-10-CM

## 2020-09-23 MED ORDER — FENOFIBRATE 145 MG PO TABS: 145.0000 mg | ORAL_TABLET | Freq: Every day | ORAL | 0 refills | Status: DC

## 2020-09-23 NOTE — Telephone Encounter (Signed)
Chart supports Rx Pt informed to schedule a follow up appointment with our office via MyChart message. Pt informed no additional refills will be given until appointment is scheduled.

## 2020-09-28 ENCOUNTER — Ambulatory Visit (INDEPENDENT_AMBULATORY_CARE_PROVIDER_SITE_OTHER): Payer: BC Managed Care – PPO | Admitting: Family Medicine

## 2020-09-28 ENCOUNTER — Other Ambulatory Visit: Payer: Self-pay

## 2020-09-28 ENCOUNTER — Encounter (INDEPENDENT_AMBULATORY_CARE_PROVIDER_SITE_OTHER): Payer: Self-pay | Admitting: Family Medicine

## 2020-09-28 VITALS — BP 153/79 | HR 75 | Temp 98.2°F | Ht 63.0 in | Wt 322.0 lb

## 2020-09-28 DIAGNOSIS — Z9189 Other specified personal risk factors, not elsewhere classified: Secondary | ICD-10-CM | POA: Diagnosis not present

## 2020-09-28 DIAGNOSIS — E213 Hyperparathyroidism, unspecified: Secondary | ICD-10-CM

## 2020-09-28 DIAGNOSIS — I10 Essential (primary) hypertension: Secondary | ICD-10-CM | POA: Diagnosis not present

## 2020-09-28 DIAGNOSIS — Z6841 Body Mass Index (BMI) 40.0 and over, adult: Secondary | ICD-10-CM

## 2020-09-28 DIAGNOSIS — R251 Tremor, unspecified: Secondary | ICD-10-CM | POA: Diagnosis not present

## 2020-09-30 NOTE — Progress Notes (Signed)
Chief Complaint:   OBESITY Madison Palmer is here to discuss her progress with her obesity treatment plan along with follow-up of her obesity related diagnoses. Madison Palmer is on keeping a food journal and adhering to recommended goals of 1600 calories and 90 g protein and states she is following her eating plan approximately 100% of the time. Madison Palmer states she is not currently exercising.  Today's visit was #: 72 Starting weight: 357 lbs Starting date: 02/27/2018 Today's weight: 322 lbs Today's date: 09/28/2020 Total lbs lost to date: 35 lbs Total lbs lost since last in-office visit: 5  Interim History: Madison Palmer went to [facility] since her last visit. She was gone for 3 days. She is going to [place] tomorrow to Friday (2 days). Average calories intake is 1700 and protein has been >90 grams. Her biggest obstacle is snacks- indulging in her cravings. She has been reviewing her food log weekly and is being mindful of calories and protein. She has been increasing and decreasing with food calories.  Subjective:   1. Essential hypertension Madison Palmer's BP is slightly elevated today. Pt denies chest pain/chest pressure/headache.   BP Readings from Last 3 Encounters:  09/28/20 (!) 153/79  09/06/20 (!) 153/83  08/25/20 (!) 142/88   2. Hyperparathyroidism Madison Regents) Madison Palmer sees Dr. Garrel Palmer at [facility]. Concerns for familial hypocaloric hypercalcemia. 24 hour urine and genetic screen done (urine normal).  3. Tremor New but not consistent. Tremor has increased in frequency since last appt. Madison Palmer reports this is sporadic in presentation and that she notices this more frequently. It is not present with purposeful movement.  4. At risk for heart disease Madison Palmer is at a higher than average risk for cardiovascular disease due to obesity.   Assessment/Plan:   1. Essential hypertension Madison Palmer is working on healthy weight loss and exercise to improve blood pressure control. We will watch for signs of  hypotension as she continues her lifestyle modifications. -Follow up with Dr. Percival Palmer; Pt is to monitor BP between appts and message cardiology with any concerns.   2. Hyperparathyroidism (Madison Palmer) Follow up in 6 days with Dr. Garrel Palmer.  3. Tremor Refer to cardiology. - Ambulatory referral to Neurology  4. At risk for heart disease Madison Palmer was given approximately 15 minutes of coronary artery disease prevention counseling today. She is 55 y.o. female and has risk factors for heart disease including obesity. We discussed intensive lifestyle modifications today with an emphasis on specific weight loss instructions and strategies.   Repetitive spaced learning was employed today to elicit superior memory formation and behavioral change.  5. Class 3 severe obesity with serious comorbidity and body mass index (BMI) of 60.0 to 69.9 in adult, unspecified obesity type (HCC) Madison Palmer is currently in the action stage of change. As such, her goal is to continue with weight loss efforts. She has agreed to keeping a food journal and adhering to recommended goals of 1600 calories and 90+ grams protein.   Exercise goals: All adults should avoid inactivity. Some physical activity is better than none, and adults who participate in any amount of physical activity gain some health benefits.  Behavioral modification strategies: increasing lean protein intake, decreasing eating out, meal planning and cooking strategies, keeping healthy foods in the home, travel eating strategies and keeping a strict food journal.  Madison Palmer has agreed to follow-up with our clinic in 3 weeks. She was informed of the importance of frequent follow-up visits to maximize her success with intensive lifestyle modifications for her multiple health conditions.  Objective:   Blood pressure (!) 153/79, pulse 75, temperature 98.2 F (36.8 C), height 5\' 3"  (1.6 m), weight (!) 322 lb (146.1 kg), last menstrual period 05/02/2017, SpO2 95 %. Body mass  index is 57.04 kg/m.  General: Cooperative, alert, well developed, in no acute distress. HEENT: Conjunctivae and lids unremarkable. Cardiovascular: Regular rhythm.  Lungs: Normal work of breathing. Neurologic: No focal deficits.   Lab Results  Component Value Date   CREATININE 1.12 (H) 09/01/2020   BUN 24 09/01/2020   NA 142 09/01/2020   K 4.4 09/01/2020   CL 102 09/01/2020   CO2 24 09/01/2020   Lab Results  Component Value Date   ALT 11 06/10/2020   AST 12 06/10/2020   ALKPHOS 90 06/10/2020   BILITOT 0.3 06/10/2020   Lab Results  Component Value Date   HGBA1C 5.6 06/01/2020   HGBA1C 5.7 (H) 12/11/2019   HGBA1C 5.7 (H) 12/24/2018   HGBA1C 6.0 (H) 02/27/2018   HGBA1C 5.7 09/10/2017   Lab Results  Component Value Date   INSULIN 18.9 06/01/2020   INSULIN 11.4 12/11/2019   INSULIN 21.1 12/30/2018   INSULIN 9.6 02/27/2018   Lab Results  Component Value Date   TSH 2.340 09/01/2020   Lab Results  Component Value Date   CHOL 216 (H) 06/10/2020   HDL 50 06/10/2020   LDLCALC 138 (H) 06/10/2020   TRIG 154 (H) 06/10/2020   CHOLHDL 5.6 (H) 12/24/2018   Lab Results  Component Value Date   WBC 5.4 09/25/2018   HGB 12.7 09/25/2018   HCT 40.4 09/25/2018   MCV 94.6 09/25/2018   PLT 229 09/25/2018   Lab Results  Component Value Date   IRON 44 02/20/2018   TIBC 347 05/29/2017     Attestation Statements:   Reviewed by clinician on day of visit: allergies, medications, problem list, medical history, surgical history, family history, social history, and previous encounter notes.  Coral Ceo, CMA, am acting as transcriptionist for Coralie Common, MD.  I have reviewed the above documentation for accuracy and completeness, and I agree with the above. - Jinny Blossom, MD

## 2020-10-05 NOTE — Progress Notes (Signed)
[facility] [address] [place] Phone: [phone] Subjective:   I Madison Palmer am serving as a Education administrator for Dr. Hulan Saas.  This visit occurred during the SARS-CoV-2 public health emergency.  Safety protocols were in place, including screening questions prior to the visit, additional usage of staff PPE, and extensive cleaning of exam room while observing appropriate contact time as indicated for disinfecting solutions.   I'm seeing this patient by the request  of:  [name], Charlene Brooke, NP  CC: Low back and neck pain follow-up  [id number]   08/25/2020 Multiple areas of chronic arthritic changes noted.  We discussed with patient about icing regimen and home exercises.  Discussed the importance of weight loss again.  Given injections of Toradol and Depo-Medrol today but I am hoping will be beneficial.  Discussed icing regimen follow-up again in 6 weeks  Patient has had intervention previously but continues to have a climbing calcium level.  Discussed need to discuss this with her primary care physician as well as her endocrinologist.  Update 10/06/2020 Madison Palmer is a 55 y.o. female coming in with complaint of neck and low back pain. Patient states she is sore and tight today. Just had PT before the visit. PT is going ok.  Patient states that the worst pain seems to be more in the neck and the upper back.  Is making progress but if she tries to increase some activities she seems to have more discomfort.  States that he did not the resistance bands sometimes can make things a little more aggravated.  Patient states that if she was able to be a little more consistent with the physical therapy she may be able to do more.      Past Medical History:  Diagnosis Date  . Anemia   . Anxiety   . Arrhythmia    tachycardia  . Arthritis   . Chickenpox   . Depression   . Diverticulitis   . GERD (gastroesophageal reflux disease)   .  Glaucoma   . Hyperlipidemia   . Hyperparathyroidism ([place])   . Hypertension   . Inflammatory polyps of colon ([place])   . LVH (left ventricular hypertrophy)   . Lymphedema   . PCOS (polycystic ovarian syndrome)   . Prediabetes   . Recurrent UTI   . Sleep apnea    CPAP  . Thyroid disease   . Vitamin D deficiency    Past Surgical History:  Procedure Laterality Date  . BREAST BIOPSY  2015  . CESAREAN SECTION  2004  . INNER EAR SURGERY     ear and sinus surgery  . LAPAROSCOPIC REPAIR AND REMOVAL OF GASTRIC BAND    . OOPHORECTOMY Left   . TONSILLECTOMY AND ADENOIDECTOMY     Social History   Socioeconomic History  . Marital status: Married    Spouse name: Not on file  . Number of children: 1  . Years of education: Not on file  . Highest education level: Not on file  Occupational History  . Occupation: Unemployed  Tobacco Use  . Smoking status: Never Smoker  . Smokeless tobacco: Never Used  Vaping Use  . Vaping Use: Never used  Substance and Sexual Activity  . Alcohol use: Yes    Comment: social  . Drug use: No  . Sexual activity: Not Currently  Other Topics Concern  . Not on file  Social History Narrative   Lives with son and companion.  Social Determinants of Health   Financial Resource Strain: Not on file  Food Insecurity: Not on file  Transportation Needs: Not on file  Physical Activity: Not on file  Stress: Not on file  Social Connections: Not on file   Allergies  Allergen Reactions  . Other     Other reaction(s): Other (See Comments) Instructed not to take by Cardiology.  . Fentanyl Hives  . Levofloxacin Hives  . Midazolam Hives  . Pollen Extract     seasonal  . Atorvastatin     Muscle pain in legs   . Doxycycline Itching and Swelling  . Hydralazine Hcl Other (See Comments)    Hypercalcemia   . Rosuvastatin     Abdominal pain   Family History  Adopted: Yes  Problem Relation Age of Onset  . Hearing loss Son        right   . Healthy Son      Current Outpatient Medications (Endocrine & Metabolic):  .  levothyroxine (SYNTHROID) 75 MCG tablet, Take 1 tablet (75 mcg total) by mouth daily. .  metFORMIN (GLUCOPHAGE) 500 MG tablet, Take 1 tablet (500 mg total) by mouth daily with breakfast.  Current Outpatient Medications (Cardiovascular):  .  amLODipine (NORVASC) 5 MG tablet, Take 1 tablet (5 mg total) by mouth daily. .  cloNIDine (CATAPRES) 0.1 MG tablet, 2 (two) times daily.  .  fenofibrate (TRICOR) 145 MG tablet, Take 1 tablet (145 mg total) by mouth daily. .  furosemide (LASIX) 40 MG tablet, TAKE 1 TABLET(40 MG) BY MOUTH DAILY .  labetalol (NORMODYNE) 300 MG tablet, TAKE 2 TABLETS(600 MG) BY MOUTH TWICE DAILY (Patient taking differently: Takes 1 tablet twice a day.) .  lisinopril (ZESTRIL) 40 MG tablet, Take 1 tablet (40 mg total) by mouth daily.  Current Outpatient Medications (Respiratory):  .  cetirizine (ZYRTEC ALLERGY) 10 MG tablet, Take 1 tablet (10 mg total) by mouth daily. .  fluticasone (FLONASE) 50 MCG/ACT nasal spray, Place 2 sprays into both nostrils daily.  Current Outpatient Medications (Analgesics):  .  acetaminophen (TYLENOL) 500 MG tablet, Take 1,000 mg by mouth 3 (three) times daily as needed for moderate pain or headache.   Current Outpatient Medications (Other):  .  divalproex (DEPAKOTE) 500 MG DR tablet, Take 1 tablet (500 mg total) by mouth at bedtime. Marland Kitchen  esomeprazole (NEXIUM) 40 MG capsule, TAKE ONE CAPSULE(40 MG TOTAL) BY MOUTH TWICE DAILY .  gabapentin (NEURONTIN) 100 MG capsule, Take 2 capsules (200 mg total) by mouth at bedtime. .  Lurasidone HCl 60 MG TABS, Take 1 tablet (60 mg total) by mouth in the morning. .  Potassium Chloride ER 20 MEQ TBCR, Take 1 tablet by mouth daily. 1 tablet once a day   Reviewed prior external information including notes and imaging from  primary care provider As well as notes that were available from care everywhere and other healthcare systems.  Past medical  history, social, surgical and family history all reviewed in electronic medical record.  No pertanent information unless stated regarding to the chief complaint.   Review of Systems:  No headache, visual changes, nausea, vomiting, diarrhea, constipation, dizziness, abdominal pain, skin rash, fevers, chills, night sweats, weight loss, swollen lymph nodes, , joint swelling, chest pain, shortness of breath, mood changes. POSITIVE muscle aches, body aches  Objective  Blood pressure (!) 146/88, pulse 79, height 5\' 3"  (1.6 m), weight (!) 326 lb (147.9 kg), last menstrual period 05/02/2017, SpO2 94 %.   General: No apparent distress  alert and oriented x3 mood and affect normal, dressed appropriately.  Morbidly obese HEENT: Pupils equal, extraocular movements intact  Respiratory: Patient's speak in full sentences and does not appear short of breath  Cardiovascular: No lower extremity edema, non tender, no erythema  Gait severely antalgic Patient's neck does have some mild loss of lordosis.  Some limited sidebending bilaterally.  Patient does have tightness noted in the paraspinal musculature of the parascapular region.  Osteopathic findings C4 flexed rotated and side bent right T4 extended rotated and side bent right with inhaled rib    Impression and Recommendations:     The above documentation has been reviewed and is accurate and complete Lyndal Pulley, DO

## 2020-10-06 ENCOUNTER — Encounter: Payer: Self-pay | Admitting: Physical Therapy

## 2020-10-06 ENCOUNTER — Ambulatory Visit (INDEPENDENT_AMBULATORY_CARE_PROVIDER_SITE_OTHER): Payer: BC Managed Care – PPO | Admitting: Family Medicine

## 2020-10-06 ENCOUNTER — Other Ambulatory Visit: Payer: Self-pay

## 2020-10-06 ENCOUNTER — Ambulatory Visit: Payer: BC Managed Care – PPO | Attending: Family Medicine | Admitting: Physical Therapy

## 2020-10-06 ENCOUNTER — Encounter: Payer: Self-pay | Admitting: Family Medicine

## 2020-10-06 ENCOUNTER — Telehealth (HOSPITAL_COMMUNITY): Payer: BC Managed Care – PPO | Admitting: Psychiatry

## 2020-10-06 DIAGNOSIS — M9902 Segmental and somatic dysfunction of thoracic region: Secondary | ICD-10-CM | POA: Diagnosis not present

## 2020-10-06 DIAGNOSIS — M6281 Muscle weakness (generalized): Secondary | ICD-10-CM

## 2020-10-06 DIAGNOSIS — R252 Cramp and spasm: Secondary | ICD-10-CM

## 2020-10-06 DIAGNOSIS — M9908 Segmental and somatic dysfunction of rib cage: Secondary | ICD-10-CM

## 2020-10-06 DIAGNOSIS — G8929 Other chronic pain: Secondary | ICD-10-CM | POA: Diagnosis present

## 2020-10-06 DIAGNOSIS — M9901 Segmental and somatic dysfunction of cervical region: Secondary | ICD-10-CM

## 2020-10-06 DIAGNOSIS — M545 Low back pain, unspecified: Secondary | ICD-10-CM | POA: Diagnosis present

## 2020-10-06 DIAGNOSIS — M542 Cervicalgia: Secondary | ICD-10-CM

## 2020-10-06 DIAGNOSIS — R262 Difficulty in walking, not elsewhere classified: Secondary | ICD-10-CM | POA: Diagnosis present

## 2020-10-06 NOTE — Therapy (Signed)
[place]. [place], [place], [address] Phone: [phone]   Fax:  [phone]  Physical Therapy Treatment  Patient Details  Name: Madison Palmer MRN: [id number] Date of Birth: 04/11/1966 Referring Provider (PT): Esmeralda Arthur Date: 10/06/2020   PT End of Session - 10/06/20 1256    Visit Number 9    Date for PT Re-Evaluation 11/09/20    PT Start Time 1220    PT Stop Time 1259    PT Time Calculation (min) 39 min    Activity Tolerance Patient tolerated treatment well    Behavior During Therapy [facility] for tasks assessed/performed           Past Medical History:  Diagnosis Date  . Anemia   . Anxiety   . Arrhythmia    tachycardia  . Arthritis   . Chickenpox   . Depression   . Diverticulitis   . GERD (gastroesophageal reflux disease)   . Glaucoma   . Hyperlipidemia   . Hyperparathyroidism ([place])   . Hypertension   . Inflammatory polyps of colon ([place])   . LVH (left ventricular hypertrophy)   . Lymphedema   . PCOS (polycystic ovarian syndrome)   . Prediabetes   . Recurrent UTI   . Sleep apnea    CPAP  . Thyroid disease   . Vitamin D deficiency     Past Surgical History:  Procedure Laterality Date  . BREAST BIOPSY  2015  . CESAREAN SECTION  2004  . INNER EAR SURGERY     ear and sinus surgery  . LAPAROSCOPIC REPAIR AND REMOVAL OF GASTRIC BAND    . OOPHORECTOMY Left   . TONSILLECTOMY AND ADENOIDECTOMY      There were no vitals filed for this visit.   Subjective Assessment - 10/06/20 1224    Subjective Pt reports pain has been "like a rollercoaster" and varies in severity from day to day.    Currently in Pain? Yes    Pain Score 6     Pain Location Neck   and low back             [facility] PT Assessment - 10/06/20 0001      Observation/Other Assessments   Focus on Therapeutic Outcomes (FOTO)  63%                         [facility] Adult PT Treatment/Exercise - 10/06/20 0001       Neck Exercises: Seated   Neck Retraction 20 reps;3 secs    Shoulder Flexion 20 reps;Both;Weights    Shoulder Flexion Weights (lbs) 2   first set with 1#   Shoulder ABduction Both;20 reps    Shoulder Abduction Weights (lbs) 2    Other Seated Exercise ER yellow tband 2x10; Horiz Abd yellow 2x10      Lumbar Exercises: Aerobic   UBE (Upper Arm Bike) L2 x 3 min each    Nustep L2 x 4 min   limited time d/t increase in knee pain after several minutes     Lumbar Exercises: Seated   Other Seated Lumbar Exercises Rows and extension Green Tband 2x15                    PT Short Term Goals - 08/30/20 1419      PT SHORT TERM GOAL #1   Title independent with initial HEP    Status Partially Met  PT Long Term Goals - 08/30/20 1419      PT LONG TERM GOAL #1   Title understand proper body mechanics for ADL's    Status Partially Met      PT LONG TERM GOAL #2   Title increase lumbar ROM 25%    Status On-going      PT LONG TERM GOAL #3   Title decrease pain 50%    Status On-going                 Plan - 10/06/20 1256    Clinical Impression Statement Pt demos some increased neck pain reported with abduction and ER ex's. Cues for posture/form. Able to complete all interventions; some c/o of increased tightness. Did try nustep today; increased knee pain towards end but able to make it 4 min. Continue to progress to tolerance. DN next rx if indicated.    PT Treatment/Interventions ADLs/Self Care Home Management;Cryotherapy;Electrical Stimulation;Moist Heat;Functional mobility training;Therapeutic activities;Therapeutic exercise;Balance training;Neuromuscular re-education;Manual techniques;Patient/family education;Dry needling;Gait training;Iontophoresis 6m/ml Dexamethasone    PT Next Visit Plan Multi joint interventions, functional strengthening/endurance ex's, manual/modalities as indicated    Consulted and Agree with Plan of Care Patient            Patient will benefit from skilled therapeutic intervention in order to improve the following deficits and impairments:  Abnormal gait,Decreased range of motion,Difficulty walking,Increased muscle spasms,Pain,Impaired flexibility,Improper body mechanics,Decreased strength,Decreased mobility,Obesity,Cardiopulmonary status limiting activity,Decreased endurance  Visit Diagnosis: Neck pain  Cramp and spasm  Difficulty in walking, not elsewhere classified  Chronic bilateral low back pain without sciatica  Muscle weakness (generalized)     Problem List Patient Active Problem List   Diagnosis Date Noted  . Polyphagia 09/07/2020  . Restrictive lung disease 08/11/2020  . Leg swelling 01/14/2019  . Elevated coronary artery calcium score 01/14/2019  . Chronic respiratory failure ([place] 09/15/2018  . Acute on chronic respiratory failure with hypoxia and hypercapnia ([place] 09/15/2018  . Disease of thyroid gland 09/15/2018  . Bipolar 1 disorder ([place] 09/12/2018  . Prediabetes 09/05/2018  . Class 3 severe obesity with serious comorbidity and body mass index (BMI) of 50.0 to 59.9 in adult ([place] 09/05/2018  . Bipolar affective disorder, manic, severe, with psychotic behavior ([place] 08/28/2018  . Bipolar I disorder, current or most recent episode manic, with psychotic features ([place]   . Adenomatous polyp 08/18/2018  . Educated about COVID-19 virus infection 08/15/2018  . Pulmonary vascular congestion 06/24/2018  . Chronic diastolic heart failure ([place] 05/15/2018  . Dyspnea 05/15/2018  . Pulmonary nodules 05/11/2018  . Hypertensive urgency 05/11/2018  . Chronic respiratory failure with hypoxia ([place] 05/10/2018  . 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 DDD (degenerative disc disease), lumbar 03/21/2018  . Primary osteoarthritis of both knees 03/21/2018  . Dysphagia 03/19/2018  . PVC (premature ventricular contraction) 03/12/2018  . LVH (left ventricular  hypertrophy) 03/12/2018  . Generalized edema 03/12/2018  . Bilateral carotid artery stenosis 03/12/2018  . Weight gain 01/25/2018  . Hemorrhoids 01/05/2018  . Generalized postprandial abdominal pain 01/05/2018  . Hiatal hernia 01/05/2018  . Esophageal dysmotilities 01/05/2018  . Mixed hyperlipidemia 09/12/2017  . Multinodular goiter 07/27/2017  . Hyperparathyroidism ([place] 06/13/2017  . Vitamin D deficiency 06/13/2017  . History of colonic polyps 05/15/2017  . Eustachian tube dysfunction, right 05/15/2017  . Lymphatic edema 05/15/2017  . Essential hypertension 05/14/2017  . Anemia 05/11/2017  . Arrhythmia 05/11/2017  . Diverticulosis 05/11/2017  . GERD (gastroesophageal reflux disease) 05/11/2017  . Hypothyroidism  05/11/2017  . Lower extremity edema 05/11/2017  . Morbid obesity ([place]) 05/11/2017  . Polycystic ovary syndrome 05/11/2017  . OSA on CPAP 05/11/2017  . Neck pain 05/11/2017  . Allergic rhinitis 05/11/2017  . 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 [place], [place], [address] Phone: [phone]   Fax:  [phone]  Name: Madison Palmer MRN: [id number] Date of Birth: 07/20/1965

## 2020-10-06 NOTE — Assessment & Plan Note (Signed)
Attempted some muscle energy on the upper back and the neck today.  Patient responded minorly to it.  I do not know if patient will make significant progress or not with this.  Patient is encouraged to continue the low dose of the gabapentin and continue with formal physical therapy.  Patient will follow up with me again in 6 weeks

## 2020-10-06 NOTE — Patient Instructions (Addendum)
Keep working with PT Tried upper back manipulation Let's see how that does Continue everything else  See me in 5-6 weeks

## 2020-10-06 NOTE — Assessment & Plan Note (Signed)
   Decision today to treat with OMT was based on Physical Exam  After verbal consent patient was treated with  ME, FPR techniques in cervical, thoracic, rib, areas, all areas are chronic   Patient tolerated the procedure well with improvement in symptoms  Patient given exercises, stretches and lifestyle modifications  See medications in patient instructions if given  Patient will follow up in 4-8 weeks

## 2020-10-07 ENCOUNTER — Encounter: Payer: Self-pay | Admitting: Nurse Practitioner

## 2020-10-07 ENCOUNTER — Ambulatory Visit: Payer: BC Managed Care – PPO | Admitting: Nurse Practitioner

## 2020-10-07 ENCOUNTER — Other Ambulatory Visit (HOSPITAL_COMMUNITY)
Admission: RE | Admit: 2020-10-07 | Discharge: 2020-10-07 | Disposition: A | Discharge status: Home / Self Care | Payer: BC Managed Care – PPO | Source: Ambulatory Visit | Attending: Nurse Practitioner | Admitting: Nurse Practitioner

## 2020-10-07 VITALS — BP 146/80 | HR 83 | Temp 96.1°F | Ht 63.0 in | Wt 326.2 lb

## 2020-10-07 DIAGNOSIS — I152 Hypertension secondary to endocrine disorders: Secondary | ICD-10-CM

## 2020-10-07 DIAGNOSIS — E1169 Type 2 diabetes mellitus with other specified complication: Secondary | ICD-10-CM

## 2020-10-07 DIAGNOSIS — Z1159 Encounter for screening for other viral diseases: Secondary | ICD-10-CM

## 2020-10-07 DIAGNOSIS — Z124 Encounter for screening for malignant neoplasm of cervix: Secondary | ICD-10-CM

## 2020-10-07 DIAGNOSIS — Z23 Encounter for immunization: Secondary | ICD-10-CM

## 2020-10-07 DIAGNOSIS — E782 Mixed hyperlipidemia: Secondary | ICD-10-CM

## 2020-10-07 DIAGNOSIS — E119 Type 2 diabetes mellitus without complications: Secondary | ICD-10-CM | POA: Insufficient documentation

## 2020-10-07 DIAGNOSIS — F250 Schizoaffective disorder, bipolar type: Secondary | ICD-10-CM

## 2020-10-07 DIAGNOSIS — J9611 Chronic respiratory failure with hypoxia: Secondary | ICD-10-CM

## 2020-10-07 NOTE — Assessment & Plan Note (Signed)
Managed by cardiology Dr. Starleen Blue with amlodipine, clonidine BP above goal No LE edema, no headache or dizziness BP Readings from Last 3 Encounters:  10/07/20 (!) 146/80  10/06/20 (!) 146/88  09/28/20 (!) 153/79

## 2020-10-07 NOTE — Assessment & Plan Note (Addendum)
Controlled with diet and metformin

## 2020-10-07 NOTE — Patient Instructions (Signed)
Maintain current medications  

## 2020-10-07 NOTE — Assessment & Plan Note (Signed)
Unable to tolerate statins (leg pain)

## 2020-10-07 NOTE — Assessment & Plan Note (Addendum)
No need for supplement oxygen Resolved SOB with exertion. Use of CPAP daily

## 2020-10-07 NOTE — Progress Notes (Signed)
Subjective:  Patient ID: Madison Palmer, female    DOB: [birth year]  Age: 55 y.o. MRN: [id number]  CC: Follow-up (1 year f/u on HTN and prediabetes.)  HPI  Reviewed labs completed by weight management clinic and endocrinologist: TSH and T4, renal function, PTH, lipid panel, and HgbA1c  Essential hypertension Managed by cardiology Dr. Starleen Blue with amlodipine, clonidine BP above goal No LE edema, no headache or dizziness BP Readings from Last 3 Encounters:  10/07/20 (!) 146/80  10/06/20 (!) 146/88  09/28/20 (!) 153/79    Chronic respiratory failure with hypoxia (HCC) No need for supplement oxygen Resolved SOB with exertion. Use of CPAP daily  DM (diabetes mellitus) ([place]) Controlled with diet and metformin  Schizoaffective disorder, bipolar type ([place]) Stable mood with depakote and seroquel Under care of psychiatry Dr. Adrian Prows  Mixed hyperlipidemia Unable to tolerate statins (leg pain) BP Readings from Last 3 Encounters:  10/07/20 (!) 146/80  10/06/20 (!) 146/88  09/28/20 (!) 153/79    Wt Readings from Last 3 Encounters:  10/07/20 (!) 326 lb 3.2 oz (148 kg)  10/06/20 (!) 326 lb (147.9 kg)  09/28/20 (!) 322 lb (146.1 kg)    Reviewed past Medical, Social and Family history today.  Outpatient Medications Prior to Visit  Medication Sig Dispense Refill   acetaminophen (TYLENOL) 500 MG tablet Take 1,000 mg by mouth 3 (three) times daily as needed for moderate pain or headache.     amLODipine (NORVASC) 5 MG tablet Take 1 tablet (5 mg total) by mouth daily. 90 tablet 1   cetirizine (ZYRTEC ALLERGY) 10 MG tablet Take 1 tablet (10 mg total) by mouth daily. 90 tablet 1   cloNIDine (CATAPRES) 0.1 MG tablet 2 (two) times daily.      divalproex (DEPAKOTE) 500 MG DR tablet Take 1 tablet (500 mg total) by mouth at bedtime. 30 tablet 2   esomeprazole (NEXIUM) 40 MG capsule TAKE ONE CAPSULE(40 MG TOTAL) BY MOUTH TWICE DAILY 180 capsule 2   fenofibrate (TRICOR) 145 MG tablet Take 1  tablet (145 mg total) by mouth daily. 90 tablet 0   fluticasone (FLONASE) 50 MCG/ACT nasal spray Place 2 sprays into both nostrils daily.     furosemide (LASIX) 40 MG tablet TAKE 1 TABLET(40 MG) BY MOUTH DAILY 90 tablet 3   gabapentin (NEURONTIN) 100 MG capsule Take 2 capsules (200 mg total) by mouth at bedtime. 180 capsule 3   labetalol (NORMODYNE) 300 MG tablet TAKE 2 TABLETS(600 MG) BY MOUTH TWICE DAILY (Patient taking differently: Takes 1 tablet twice a day.) 360 tablet 1   levothyroxine (SYNTHROID) 75 MCG tablet Take 1 tablet (75 mcg total) by mouth daily. 90 tablet 0   lisinopril (ZESTRIL) 40 MG tablet Take 1 tablet (40 mg total) by mouth daily. 90 tablet 3   Lurasidone HCl 60 MG TABS Take 1 tablet (60 mg total) by mouth in the morning. 30 tablet 2   metFORMIN (GLUCOPHAGE) 500 MG tablet Take 1 tablet (500 mg total) by mouth daily with breakfast. 90 tablet 0   Potassium Chloride ER 20 MEQ TBCR Take 1 tablet by mouth daily. 1 tablet once a day 90 tablet 0   No facility-administered medications prior to visit.    ROS See HPI  Objective:  BP (!) 146/80 (BP Location: Left Arm, Patient Position: Sitting, Cuff Size: Large)   Pulse 83   Temp (!) 96.1 F (35.6 C) (Temporal)   Ht 5\' 3"  (1.6 m)   Wt (!) 326 lb 3.2  oz (148 kg)   LMP 05/02/2017   SpO2 96%   BMI 57.78 kg/m   Physical Exam Exam conducted with a chaperone present.  Constitutional:      Appearance: She is obese.  Cardiovascular:     Rate and Rhythm: Normal rate.     Pulses: Normal pulses.  Pulmonary:     Effort: Pulmonary effort is normal.  Genitourinary:    General: Normal vulva.     Labia:        Right: No rash or tenderness.        Left: No rash or tenderness.      Vagina: Normal.     Cervix: Normal.     Uterus: Normal.      Adnexa: Right adnexa normal and left adnexa normal.  Musculoskeletal:     Right lower leg: No edema.     Left lower leg: No edema.  Neurological:     Mental Status: She is alert and  oriented to person, place, and time.  Psychiatric:        Mood and Affect: Mood normal.        Behavior: Behavior normal.        Thought Content: Thought content normal.    Assessment & Plan:  This visit occurred during the SARS-CoV-2 public health emergency.  Safety protocols were in place, including screening questions prior to the visit, additional usage of staff PPE, and extensive cleaning of exam room while observing appropriate contact time as indicated for disinfecting solutions.   Madison Palmer was seen today for follow-up.  Diagnoses and all orders for this visit:  Hypertension due to endocrine disorder  Encounter for hepatitis C screening test for low risk patient -     Hepatitis C Antibody; Future  Encounter for Papanicolaou smear for cervical cancer screening -     Cytology - PAP( [place])  Need for shingles vaccine -     Varicella-zoster vaccine IM  Need for pneumococcal vaccine -     Pneumococcal conjugate vaccine 20-valent (Prevnar 20)  Schizoaffective disorder, bipolar type ([place])  Type 2 diabetes mellitus with other specified complication, without long-term current use of insulin (HCC)  Chronic respiratory failure with hypoxia ([place])  Mixed hyperlipidemia   Problem List Items Addressed This Visit       Cardiovascular and Mediastinum   Essential hypertension - Primary    Managed by cardiology Dr. Starleen Blue with amlodipine, clonidine BP above goal No LE edema, no headache or dizziness BP Readings from Last 3 Encounters:  10/07/20 (!) 146/80  10/06/20 (!) 146/88  09/28/20 (!) 153/79           Respiratory   RESOLVED: Chronic respiratory failure with hypoxia (HCC)    No need for supplement oxygen Resolved SOB with exertion. Use of CPAP daily         Endocrine   DM (diabetes mellitus) ([place])    Controlled with diet and metformin         Other   Mixed hyperlipidemia    Unable to tolerate statins (leg pain)       Schizoaffective disorder,  bipolar type ([place])    Stable mood with depakote and seroquel Under care of psychiatry Dr. Adrian Prows       Other Visit Diagnoses     Encounter for hepatitis C screening test for low risk patient       Relevant Orders   Hepatitis C Antibody   Encounter for Papanicolaou smear for cervical cancer screening  Relevant Orders   Cytology - PAP( )   Need for shingles vaccine       Relevant Orders   Varicella-zoster vaccine IM (Completed)   Need for pneumococcal vaccine       Relevant Orders   Pneumococcal conjugate vaccine 20-valent (Prevnar 20) (Completed)       Follow-up: Return in about 6 months (around 04/08/2021) for HTN and prediabetes.  Wilfred Lacy, NP

## 2020-10-07 NOTE — Assessment & Plan Note (Signed)
Stable mood with depakote and seroquel Under care of psychiatry Dr. Adrian Prows

## 2020-10-08 ENCOUNTER — Telehealth (INDEPENDENT_AMBULATORY_CARE_PROVIDER_SITE_OTHER): Payer: BC Managed Care – PPO | Admitting: Psychiatry

## 2020-10-08 ENCOUNTER — Other Ambulatory Visit: Payer: Self-pay

## 2020-10-08 ENCOUNTER — Encounter: Payer: Self-pay | Admitting: Physical Therapy

## 2020-10-08 ENCOUNTER — Encounter (HOSPITAL_COMMUNITY): Payer: Self-pay | Admitting: Psychiatry

## 2020-10-08 ENCOUNTER — Ambulatory Visit: Payer: BC Managed Care – PPO | Admitting: Physical Therapy

## 2020-10-08 DIAGNOSIS — R262 Difficulty in walking, not elsewhere classified: Secondary | ICD-10-CM

## 2020-10-08 DIAGNOSIS — F319 Bipolar disorder, unspecified: Secondary | ICD-10-CM | POA: Diagnosis not present

## 2020-10-08 DIAGNOSIS — M542 Cervicalgia: Secondary | ICD-10-CM | POA: Diagnosis not present

## 2020-10-08 DIAGNOSIS — F419 Anxiety disorder, unspecified: Secondary | ICD-10-CM

## 2020-10-08 DIAGNOSIS — M6281 Muscle weakness (generalized): Secondary | ICD-10-CM

## 2020-10-08 DIAGNOSIS — G8929 Other chronic pain: Secondary | ICD-10-CM

## 2020-10-08 DIAGNOSIS — M545 Low back pain, unspecified: Secondary | ICD-10-CM

## 2020-10-08 DIAGNOSIS — R252 Cramp and spasm: Secondary | ICD-10-CM

## 2020-10-08 MED ORDER — DIVALPROEX SODIUM 250 MG PO DR TAB: 250.0000 mg | DELAYED_RELEASE_TABLET | Freq: Every day | ORAL | 2 refills | Status: DC

## 2020-10-08 MED ORDER — LURASIDONE HCL 60 MG PO TABS: 60.0000 mg | ORAL_TABLET | Freq: Every morning | ORAL | 2 refills | Status: DC

## 2020-10-08 NOTE — Progress Notes (Signed)
Virtual Visit via Telephone Note  I connected with Madison Palmer on 10/08/20 at 10:20 AM EDT by telephone and verified that I am speaking with the correct person using two identifiers.  Location: Patient: home Provider: home office   I discussed the limitations, risks, security and privacy concerns of performing an evaluation and management service by telephone and the availability of in person appointments. I also discussed with the patient that there may be a patient responsible charge related to this service. The patient expressed understanding and agreed to proceed.   History of Present Illness: Patient is evaluated by phone session.  She is taking Depakote and Latuda.  She noticed some time lack of motivation to do things.  Though she denies any depression, crying spells, mood swings or irritability but feels tired during the day.  She sleeps good.  She admitted weight gain since the last visit but she also in the weight loss program.  She lives with her son who started community college and she is happy with that.  Patient denies any panic attack, hallucination, suicidal thoughts.  She has no tremors shakes or any EPS.  She denies drinking or using any illegal substances.   Past psychiatric history; H/O depression since early age. H/O inpatient at [facility] in May 2020[facility] for delusion, psychosis and violent behavior. D/C on Depakote 500 mg BID, Latuda 60 mg daily and Trilafon 12 mg a day. No h/o suicidal attempt, nightmares, panic attack or OCD symptoms.  No h/o drinking and drug use.   Psychiatric Specialty Exam: Physical Exam  Review of Systems  Weight (!) 326 lb (147.9 kg), last menstrual period 05/02/2017.There is no height or weight on file to calculate BMI.  General Appearance: NA  Eye Contact:  NA  Speech:  Slow  Volume:  Normal  Mood:  Euthymic and tired  Affect:  NA  Thought Process:  Goal Directed  Orientation:  Full (Time, Place, and Person)   Thought Content:  Logical  Suicidal Thoughts:  No  Homicidal Thoughts:  No  Memory:  Immediate;   Good Recent;   Fair Remote;   Fair  Judgement:  Intact  Insight:  Present  Psychomotor Activity:  NA  Concentration:  Concentration: Fair and Attention Span: Fair  Recall:  [employer] of Knowledge:  Good  Language:  Good  Akathisia:  NA  Handed:  Right  AIMS (if indicated):     Assets:  Communication Skills Desire for Improvement Housing  ADL's:  Intact  Cognition:  WNL  Sleep:   ok      Assessment and Plan: Bipolar disorder type I.  Anxiety.  Patient is stable on her current medication but sometimes she feel lack of motivation and tiredness and does not want to do things.  We discussed steady increase in her weight could be the reason as patient is clinically doing well.  I recommend she can try cutting down the Depakote from 500 to 250 mg to see if that helps her tiredness.  She also taking gabapentin.  We will keep the Latuda 60 mg daily as it is helping her mood swings.  I recommend if she noticed reducing Depakote causing worsening of symptoms then she should call us back immediately.  We will follow up in 3 months.  Patient will continue with the weight loss program.  Follow Up Instructions:    I discussed the assessment and treatment plan with the patient. The patient was provided an opportunity  to ask questions and all were answered. The patient agreed with the plan and demonstrated an understanding of the instructions.   The patient was advised to call back or seek an in-person evaluation if the symptoms worsen or if the condition fails to improve as anticipated.  I provided 15 minutes of non-face-to-face time during this encounter.   Kathlee Nations, MD

## 2020-10-08 NOTE — Therapy (Signed)
[place]. [place], [place], [address] Phone: [phone]   Fax:  [phone]  Physical Therapy Treatment Progress Note Reporting Period 08/17/20 to 10/08/20  See note below for Objective Data and Assessment of Progress/Goals.     Patient Details  Name: Madison Palmer MRN: [id number] Date of Birth: 1965/05/15 Referring Provider (PT): Esmeralda Arthur Date: 10/08/2020   PT End of Session - 10/08/20 1132     Visit Number 10    Date for PT Re-Evaluation 11/09/20    PT Start Time 1112    PT Stop Time 1145    PT Time Calculation (min) 33 min    Activity Tolerance Patient tolerated treatment well    Behavior During Therapy [facility] for tasks assessed/performed             Past Medical History:  Diagnosis Date   Acute on chronic respiratory failure with hypoxia and hypercapnia ([place]) 09/15/2018   Anemia    Anxiety    Arrhythmia    tachycardia   Arthritis    Chickenpox    Chronic respiratory failure with hypoxia ([place]) 05/10/2018   Formatting of this note might be different from the original. Last Assessment & Plan:  Continue 1 L continuous on exertion and during sleep. On her next visit we will check OSA on CPAP/room air to decide whether she needs to take oxygen along with her on her cruise in May   Depression    Diverticulitis    GERD (gastroesophageal reflux disease)    Glaucoma    Hyperlipidemia    Hyperparathyroidism ([place])    Hypertension    Inflammatory polyps of colon ([place])    LVH (left ventricular hypertrophy)    Lymphedema    PCOS (polycystic ovarian syndrome)    Prediabetes    Recurrent UTI    Sleep apnea    CPAP   Thyroid disease    Vitamin D deficiency     Past Surgical History:  Procedure Laterality Date   BREAST BIOPSY  2015   CESAREAN SECTION  2004   INNER EAR SURGERY     ear and sinus surgery   LAPAROSCOPIC REPAIR AND REMOVAL OF GASTRIC BAND     OOPHORECTOMY Left    TONSILLECTOMY  AND ADENOIDECTOMY      There were no vitals filed for this visit.   Subjective Assessment - 10/08/20 1111     Subjective Pt states feeling okay today    Currently in Pain? Yes    Pain Score 4     Pain Location Neck   and low back                              [facility] Adult PT Treatment/Exercise - 10/08/20 0001       Lumbar Exercises: Aerobic   UBE (Upper Arm Bike) L2 x 3 min each    Nustep L4 x 4 min      Moist Heat Therapy   Number Minutes Moist Heat 10 Minutes    Moist Heat Location Shoulder              Trigger Point Dry Needling - 10/08/20 0001     Consent Given? Yes    Muscles Treated Head and Neck Upper trapezius    Upper Trapezius Response Twitch reponse elicited;Palpable increased muscle length  PT Short Term Goals - 08/30/20 1419       PT SHORT TERM GOAL #1   Title independent with initial HEP    Status Partially Met               PT Long Term Goals - 08/30/20 1419       PT LONG TERM GOAL #1   Title understand proper body mechanics for ADL's    Status Partially Met      PT LONG TERM GOAL #2   Title increase lumbar ROM 25%    Status On-going      PT LONG TERM GOAL #3   Title decrease pain 50%    Status On-going                   Plan - 10/08/20 1133     Clinical Impression Statement Pt arrived ~11 min late this rx, so abbreviated session. Pt making progress toward LTGs; limited in standing ex's by increased knee pain. Pt presents to clinic reporting increased soreness today; following warmup states pain increased to 6/10. Unable to continue with TE d/t increase in pain. Requesting DN for B UT; assess response next rx. Heat at end for pain control. Progress to tolerance next rx.    PT Treatment/Interventions ADLs/Self Care Home Management;Cryotherapy;Electrical Stimulation;Moist Heat;Functional mobility training;Therapeutic activities;Therapeutic exercise;Balance  training;Neuromuscular re-education;Manual techniques;Patient/family education;Dry needling;Gait training;Iontophoresis 23m/ml Dexamethasone    PT Next Visit Plan Multi joint interventions, functional strengthening/endurance ex's, manual/modalities as indicated    Consulted and Agree with Plan of Care Patient             Patient will benefit from skilled therapeutic intervention in order to improve the following deficits and impairments:  Abnormal gait, Decreased range of motion, Difficulty walking, Increased muscle spasms, Pain, Impaired flexibility, Improper body mechanics, Decreased strength, Decreased mobility, Obesity, Cardiopulmonary status limiting activity, Decreased endurance  Visit Diagnosis: Neck pain  Cramp and spasm  Difficulty in walking, not elsewhere classified  Chronic bilateral low back pain without sciatica  Muscle weakness (generalized)     Problem List Patient Active Problem List   Diagnosis Date Noted   Schizoaffective disorder, bipolar type ([place] 10/07/2020   DM (diabetes mellitus) ([place] 10/07/2020   Somatic dysfunction of spine, cervical 10/06/2020   Polyphagia 09/07/2020   Restrictive lung disease 08/11/2020   Leg swelling 01/14/2019   Elevated coronary artery calcium score 01/14/2019   Chronic respiratory failure ([place] 09/15/2018   Disease of thyroid gland 09/15/2018   Bipolar 1 disorder ([place] 09/12/2018   Prediabetes 09/05/2018   Class 3 severe obesity with serious comorbidity and body mass index (BMI) of 50.0 to 59.9 in adult ([place] 09/05/2018   Bipolar affective disorder, manic, severe, with psychotic behavior ([place] 08/28/2018   Steroid-induced psychosis, 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   Brief psychotic disorder ([place] 08/22/2018   Bipolar I disorder, current or most recent episode manic, with psychotic features ([place]    Adenomatous polyp 08/18/2018   Educated about COVID-19 virus infection 08/15/2018   Pulmonary vascular congestion  06/24/2018   Chronic diastolic heart failure ([place] 05/15/2018   Dyspnea 05/15/2018   Pulmonary nodules 05/11/2018   Hypertensive urgency 05/11/2018   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   DDD (degenerative disc disease), lumbar 03/21/2018   Primary osteoarthritis of both knees 03/21/2018   Dysphagia 03/19/2018   PVC (premature ventricular contraction) 03/12/2018   LVH (left ventricular hypertrophy) 03/12/2018   Generalized edema 03/12/2018   Bilateral carotid artery stenosis  03/12/2018   Weight gain 01/25/2018   Hemorrhoids 01/05/2018   Generalized postprandial abdominal pain 01/05/2018   Hiatal hernia 01/05/2018   Esophageal dysmotilities 01/05/2018   Mixed hyperlipidemia 09/12/2017   Multinodular goiter 07/27/2017   Hyperparathyroidism, primary ([place]) 06/13/2017   Vitamin D deficiency 06/13/2017   History of colonic polyps 05/15/2017   Eustachian tube dysfunction, right 05/15/2017   Lymphatic edema 05/15/2017   Anemia 05/11/2017   Arrhythmia 05/11/2017   Diverticulosis 05/11/2017   GERD (gastroesophageal reflux disease) 05/11/2017   Hypothyroidism 05/11/2017   Lower extremity edema 05/11/2017   Morbid (severe) obesity due to excess calories ([place]) 05/11/2017   Polycystic ovary syndrome 05/11/2017   Obstructive sleep apnea 05/11/2017   Neck pain 05/11/2017   Allergic rhinitis 05/11/2017   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 [place], [place], [address] Phone: [phone]   Fax:  [phone]  Name: Madison Palmer MRN: [id number] Date of Birth: 09-25-1965

## 2020-10-11 ENCOUNTER — Other Ambulatory Visit: Payer: Self-pay | Admitting: Nurse Practitioner

## 2020-10-12 ENCOUNTER — Other Ambulatory Visit: Payer: Self-pay

## 2020-10-12 ENCOUNTER — Encounter: Payer: Self-pay | Admitting: Physical Therapy

## 2020-10-12 ENCOUNTER — Ambulatory Visit: Payer: BC Managed Care – PPO | Admitting: Physical Therapy

## 2020-10-12 DIAGNOSIS — M542 Cervicalgia: Secondary | ICD-10-CM

## 2020-10-12 DIAGNOSIS — R252 Cramp and spasm: Secondary | ICD-10-CM

## 2020-10-12 DIAGNOSIS — M6281 Muscle weakness (generalized): Secondary | ICD-10-CM

## 2020-10-12 DIAGNOSIS — G8929 Other chronic pain: Secondary | ICD-10-CM

## 2020-10-12 DIAGNOSIS — R262 Difficulty in walking, not elsewhere classified: Secondary | ICD-10-CM

## 2020-10-12 LAB — CYTOLOGY - PAP
Adequacy: ABSENT
Comment: NEGATIVE
Diagnosis: NEGATIVE
High risk HPV: NEGATIVE

## 2020-10-12 LAB — HEPATITIS C ANTIBODY: Hep C Virus Ab: <0.1 s/co ratio (ref 0.0–0.9)

## 2020-10-12 NOTE — Therapy (Signed)
[place]. [place], [place], [address] Phone: [phone]   Fax:  [phone]  Physical Therapy Treatment  Patient Details  Name: Madison Palmer MRN: [id number] Date of Birth: 1965/12/25 Referring Provider (PT): Esmeralda Arthur Date: 10/12/2020   PT End of Session - 10/12/20 1223     Visit Number 11    Date for PT Re-Evaluation 11/09/20    PT Start Time 1153    PT Stop Time 1230    PT Time Calculation (min) 37 min    Activity Tolerance Patient tolerated treatment well    Behavior During Therapy [facility] for tasks assessed/performed             Past Medical History:  Diagnosis Date   Acute on chronic respiratory failure with hypoxia and hypercapnia ([place]) 09/15/2018   Anemia    Anxiety    Arrhythmia    tachycardia   Arthritis    Chickenpox    Chronic respiratory failure with hypoxia ([place]) 05/10/2018   Formatting of this note might be different from the original. Last Assessment & Plan:  Continue 1 L continuous on exertion and during sleep. On her next visit we will check OSA on CPAP/room air to decide whether she needs to take oxygen along with her on her cruise in [place]   Depression    Diverticulitis    GERD (gastroesophageal reflux disease)    Glaucoma    Hyperlipidemia    Hyperparathyroidism ([place])    Hypertension    Inflammatory polyps of colon ([place])    LVH (left ventricular hypertrophy)    Lymphedema    PCOS (polycystic ovarian syndrome)    Prediabetes    Recurrent UTI    Sleep apnea    CPAP   Thyroid disease    Vitamin D deficiency     Past Surgical History:  Procedure Laterality Date   BREAST BIOPSY  2015   CESAREAN SECTION  2004   INNER EAR SURGERY     ear and sinus surgery   LAPAROSCOPIC REPAIR AND REMOVAL OF GASTRIC BAND     OOPHORECTOMY Left    TONSILLECTOMY AND ADENOIDECTOMY      There were no vitals filed for this visit.   Subjective Assessment - 10/12/20 1155      Subjective Some soreness in R shoulder ans back of knees    Currently in Pain? Yes    Pain Score 6     Pain Location Shoulder    Pain Orientation Right    Pain Descriptors / Indicators Sore                               [facility] Adult PT Treatment/Exercise - 10/12/20 0001       Neck Exercises: Standing   Other Standing Exercises Shrugs 5lb 2x10      Neck Exercises: Seated   Shoulder Flexion 20 reps;Both;Weights    Shoulder Flexion Weights (lbs) 2    Other Seated Exercise Horiz abd yellow 2x10      Lumbar Exercises: Aerobic   UBE (Upper Arm Bike) L2 x 3 min each      Lumbar Exercises: Standing   Row Theraband;Both;Strengthening;15 reps   x2   Theraband Level (Row) Level 4 (Blue)      Moist Heat Therapy   Number Minutes Moist Heat 10 Minutes    Moist Heat Location Shoulder      Manual  Therapy   Manual Therapy Soft tissue mobilization;Passive ROM    Soft tissue mobilization upper traps                      PT Short Term Goals - 08/30/20 1419       PT SHORT TERM GOAL #1   Title independent with initial HEP    Status Partially Met               PT Long Term Goals - 08/30/20 1419       PT LONG TERM GOAL #1   Title understand proper body mechanics for ADL's    Status Partially Met      PT LONG TERM GOAL #2   Title increase lumbar ROM 25%    Status On-going      PT LONG TERM GOAL #3   Title decrease pain 50%    Status On-going                   Plan - 10/12/20 1224     Clinical Impression Statement Pt ~8 minutes late for today's treatment session reporting soreness in upper R shoulder and behind her knee. She was able to complete all interventions but reported an increase in upper trap and posterior neck tightness. Some relief with MT and MHP. Postural cues needed throughout session.    Personal Factors and Comorbidities Comorbidity 2    Comorbidities obesity, lymphedema    Examination-Activity Limitations  Locomotion Level;Stand;Stairs    Examination-Participation Restrictions Community Activity;Interpersonal Relationship    Stability/Clinical Decision Making Stable/Uncomplicated    Rehab Potential Fair    PT Frequency 2x / week    PT Duration 8 weeks    PT Treatment/Interventions ADLs/Self Care Home Management;Cryotherapy;Electrical Stimulation;Moist Heat;Functional mobility training;Therapeutic activities;Therapeutic exercise;Balance training;Neuromuscular re-education;Manual techniques;Patient/family education;Dry needling;Gait training;Iontophoresis 67m/ml Dexamethasone    PT Next Visit Plan Multi joint interventions, functional strengthening/endurance ex's, manual/modalities as indicated             Patient will benefit from skilled therapeutic intervention in order to improve the following deficits and impairments:  Abnormal gait, Decreased range of motion, Difficulty walking, Increased muscle spasms, Pain, Impaired flexibility, Improper body mechanics, Decreased strength, Decreased mobility, Obesity, Cardiopulmonary status limiting activity, Decreased endurance  Visit Diagnosis: Neck pain  Cramp and spasm  Chronic bilateral low back pain without sciatica  Difficulty in walking, not elsewhere classified  Muscle weakness (generalized)     Problem List Patient Active Problem List   Diagnosis Date Noted   Schizoaffective disorder, bipolar type ([place] 10/07/2020   DM (diabetes mellitus) (HF[place] 10/07/2020   Somatic dysfunction of spine, cervical 10/06/2020   Polyphagia 09/07/2020   Restrictive lung disease 08/11/2020   Leg swelling 01/14/2019   Elevated coronary artery calcium score 01/14/2019   Chronic respiratory failure ([place] 09/15/2018   Disease of thyroid gland 09/15/2018   Bipolar 1 disorder (HL[place] 09/12/2018   Prediabetes 09/05/2018   Class 3 severe obesity with serious comorbidity and body mass index (BMI) of 50.0 to 59.9 in adult (HVenice Gardens 09/05/2018   Bipolar affective  disorder, manic, severe, with psychotic behavior (HB[place] 08/28/2018   Steroid-induced psychosis, 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HE[place] 08/23/2018   Brief psychotic disorder (HD[place] 08/22/2018   Bipolar I disorder, current or most recent episode manic, with psychotic features (H[place]    Adenomatous polyp 08/18/2018   Educated about COVID-19 virus infection 08/15/2018   Pulmonary vascular congestion 06/24/2018   Chronic diastolic heart failure ([place] 05/15/2018   Dyspnea  05/15/2018   Pulmonary nodules 05/11/2018   Hypertensive urgency 05/11/2018   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[place]) 04/26/2018   DDD (degenerative disc disease), lumbar 03/21/2018   Primary osteoarthritis of both knees 03/21/2018   Dysphagia 03/19/2018   PVC (premature ventricular contraction) 03/12/2018   LVH (left ventricular hypertrophy) 03/12/2018   Generalized edema 03/12/2018   Bilateral carotid artery stenosis 03/12/2018   Weight gain 01/25/2018   Hemorrhoids 01/05/2018   Generalized postprandial abdominal pain 01/05/2018   Hiatal hernia 01/05/2018   Esophageal dysmotilities 01/05/2018   Mixed hyperlipidemia 09/12/2017   Multinodular goiter 07/27/2017   Hyperparathyroidism, primary ([place]) 06/13/2017   Vitamin D deficiency 06/13/2017   History of colonic polyps 05/15/2017   Eustachian tube dysfunction, right 05/15/2017   Lymphatic edema 05/15/2017   Anemia 05/11/2017   Arrhythmia 05/11/2017   Diverticulosis 05/11/2017   GERD (gastroesophageal reflux disease) 05/11/2017   Hypothyroidism 05/11/2017   Lower extremity edema 05/11/2017   Morbid (severe) obesity due to excess calories ([place]) 05/11/2017   Polycystic ovary syndrome 05/11/2017   Obstructive sleep apnea 05/11/2017   Neck pain 05/11/2017   Allergic rhinitis 05/11/2017   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 [place], [place], [address] Phone: [phone]   Fax:  [phone]  Name: Madison Palmer MRN: [id number] Date of Birth: 08/03/1965

## 2020-10-14 ENCOUNTER — Other Ambulatory Visit: Payer: Self-pay

## 2020-10-14 ENCOUNTER — Encounter: Payer: Self-pay | Admitting: Physical Therapy

## 2020-10-14 ENCOUNTER — Ambulatory Visit: Payer: BC Managed Care – PPO | Admitting: Physical Therapy

## 2020-10-14 DIAGNOSIS — M542 Cervicalgia: Secondary | ICD-10-CM | POA: Diagnosis not present

## 2020-10-14 DIAGNOSIS — R252 Cramp and spasm: Secondary | ICD-10-CM

## 2020-10-14 DIAGNOSIS — G8929 Other chronic pain: Secondary | ICD-10-CM

## 2020-10-14 NOTE — Therapy (Signed)
[place]. [place], [place], [address] Phone: [phone]   Fax:  [phone]  Physical Therapy Treatment  Patient Details  Name: Madison Palmer MRN: [id number] Date of Birth: December 17, 1965 Referring Provider (PT): Esmeralda Arthur Date: 10/14/2020   PT End of Session - 10/14/20 1220     Visit Number 12    Date for PT Re-Evaluation 11/09/20    PT Start Time 1155    PT Stop Time 1230    PT Time Calculation (min) 35 min    Activity Tolerance Patient tolerated treatment well    Behavior During Therapy [facility] for tasks assessed/performed             Past Medical History:  Diagnosis Date   Acute on chronic respiratory failure with hypoxia and hypercapnia ([place]) 09/15/2018   Anemia    Anxiety    Arrhythmia    tachycardia   Arthritis    Chickenpox    Chronic respiratory failure with hypoxia ([place]) 05/10/2018   Formatting of this note might be different from the original. Last Assessment & Plan:  Continue 1 L continuous on exertion and during sleep. On her next visit we will check OSA on CPAP/room air to decide whether she needs to take oxygen along with her on her cruise in May   Depression    Diverticulitis    GERD (gastroesophageal reflux disease)    Glaucoma    Hyperlipidemia    Hyperparathyroidism ([place])    Hypertension    Inflammatory polyps of colon ([place])    LVH (left ventricular hypertrophy)    Lymphedema    PCOS (polycystic ovarian syndrome)    Prediabetes    Recurrent UTI    Sleep apnea    CPAP   Thyroid disease    Vitamin D deficiency     Past Surgical History:  Procedure Laterality Date   BREAST BIOPSY  2015   CESAREAN SECTION  2004   INNER EAR SURGERY     ear and sinus surgery   LAPAROSCOPIC REPAIR AND REMOVAL OF GASTRIC BAND     OOPHORECTOMY Left    TONSILLECTOMY AND ADENOIDECTOMY      There were no vitals filed for this visit.   Subjective Assessment - 10/14/20 1155      Subjective "Knees hurt a little bit today" Seen orthopedic yesterday, trying to get more gel shots.  Shoulders did ok after last session    Currently in Pain? Yes    Pain Score 3     Pain Location Shoulder    Pain Orientation Left                [facility] PT Assessment - 10/14/20 0001       AROM   Lumbar Flexion WFL    Lumbar Extension limited 25%    Lumbar - Right Side Bend limited 25% + pain R LB    Lumbar - Left Side Bend limited 25% + pain R LB    Lumbar - Right Rotation limited 25% + pain R LB    Lumbar - Left Rotation limited 25% + pain R LB                           [facility] Adult PT Treatment/Exercise - 10/14/20 0001       Neck Exercises: Standing   Other Standing Exercises Shrugs 4lb 2x10      Neck Exercises: Seated  Other Seated Exercise ER yellow tband 2x10; Horiz Abd yellow 2x10      Lumbar Exercises: Aerobic   UBE (Upper Arm Bike) L2 x 3 min each      Lumbar Exercises: Standing   Row Theraband;Both;Strengthening;15 reps   x2   Theraband Level (Row) Level 4 (Blue)      Moist Heat Therapy   Number Minutes Moist Heat 10 Minutes    Moist Heat Location Shoulder;Cervical                      PT Short Term Goals - 08/30/20 1419       PT SHORT TERM GOAL #1   Title independent with initial HEP    Status Partially Met               PT Long Term Goals - 10/14/20 1216       PT LONG TERM GOAL #2   Title increase lumbar ROM 25%    Status Achieved                   Plan - 10/14/20 1220     Clinical Impression Statement Pt reports better recovery after last session. She did report that she was able to tolerate more activity today before any increase in symptoms. Tactile cues to elbows needed with shoulder ER. Less discomfort performing horizontal abduction today. Pt has progressed towards goals increasing her lumbar ROM.    Personal Factors and Comorbidities Comorbidity 2    Comorbidities obesity, lymphedema     Examination-Activity Limitations Locomotion Level;Stand;Stairs    Examination-Participation Restrictions Community Activity;Interpersonal Relationship    Rehab Potential Fair    PT Frequency 2x / week    PT Duration 8 weeks    PT Treatment/Interventions ADLs/Self Care Home Management;Cryotherapy;Electrical Stimulation;Moist Heat;Functional mobility training;Therapeutic activities;Therapeutic exercise;Balance training;Neuromuscular re-education;Manual techniques;Patient/family education;Dry needling;Gait training;Iontophoresis 78m/ml Dexamethasone    PT Next Visit Plan Multi joint interventions, functional strengthening/endurance ex's, manual/modalities as indicated             Patient will benefit from skilled therapeutic intervention in order to improve the following deficits and impairments:  Abnormal gait, Decreased range of motion, Difficulty walking, Increased muscle spasms, Pain, Impaired flexibility, Improper body mechanics, Decreased strength, Decreased mobility, Obesity, Cardiopulmonary status limiting activity, Decreased endurance  Visit Diagnosis: Neck pain  Chronic bilateral low back pain without sciatica  Cramp and spasm     Problem List Patient Active Problem List   Diagnosis Date Noted   Schizoaffective disorder, bipolar type (HLakeview North 10/07/2020   DM (diabetes mellitus) (HP[place] 10/07/2020   Somatic dysfunction of spine, cervical 10/06/2020   Polyphagia 09/07/2020   Restrictive lung disease 08/11/2020   Leg swelling 01/14/2019   Elevated coronary artery calcium score 01/14/2019   Chronic respiratory failure (HP[place] 09/15/2018   Disease of thyroid gland 09/15/2018   Bipolar 1 disorder (HV[place] 09/12/2018   Prediabetes 09/05/2018   Class 3 severe obesity with serious comorbidity and body mass index (BMI) of 50.0 to 59.9 in adult (HSaxis 09/05/2018   Bipolar affective disorder, manic, severe, with psychotic behavior (HDixon 08/28/2018   Steroid-induced psychosis, with  hallucinations (HOk[place] 08/23/2018   Brief psychotic disorder (HF[place] 08/22/2018   Bipolar I disorder, current or most recent episode manic, with psychotic features (HChug[place]    Adenomatous polyp 08/18/2018   Educated about COVID-19 virus infection 08/15/2018   Pulmonary vascular congestion 06/24/2018   Chronic diastolic heart failure (HH[place] 05/15/2018   Dyspnea 05/15/2018   Pulmonary  nodules 05/11/2018   Hypertensive urgency 05/11/2018   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[place]) 04/26/2018   DDD (degenerative disc disease), lumbar 03/21/2018   Primary osteoarthritis of both knees 03/21/2018   Dysphagia 03/19/2018   PVC (premature ventricular contraction) 03/12/2018   LVH (left ventricular hypertrophy) 03/12/2018   Generalized edema 03/12/2018   Bilateral carotid artery stenosis 03/12/2018   Weight gain 01/25/2018   Hemorrhoids 01/05/2018   Generalized postprandial abdominal pain 01/05/2018   Hiatal hernia 01/05/2018   Esophageal dysmotilities 01/05/2018   Mixed hyperlipidemia 09/12/2017   Multinodular goiter 07/27/2017   Hyperparathyroidism, primary ([place]) 06/13/2017   Vitamin D deficiency 06/13/2017   History of colonic polyps 05/15/2017   Eustachian tube dysfunction, right 05/15/2017   Lymphatic edema 05/15/2017   Anemia 05/11/2017   Arrhythmia 05/11/2017   Diverticulosis 05/11/2017   GERD (gastroesophageal reflux disease) 05/11/2017   Hypothyroidism 05/11/2017   Lower extremity edema 05/11/2017   Morbid (severe) obesity due to excess calories ([place]) 05/11/2017   Polycystic ovary syndrome 05/11/2017   Obstructive sleep apnea 05/11/2017   Neck pain 05/11/2017   Allergic rhinitis 05/11/2017   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 [place], [place], [address] Phone: [phone]   Fax:   [phone]  Name: Madison Palmer MRN: [id number] Date of Birth: April 28, 1966

## 2020-10-19 ENCOUNTER — Other Ambulatory Visit: Payer: Self-pay

## 2020-10-19 ENCOUNTER — Other Ambulatory Visit: Payer: Self-pay | Admitting: "Endocrinology

## 2020-10-19 ENCOUNTER — Encounter: Payer: Self-pay | Admitting: Physical Therapy

## 2020-10-19 ENCOUNTER — Ambulatory Visit: Payer: BC Managed Care – PPO | Admitting: Physical Therapy

## 2020-10-19 DIAGNOSIS — E210 Primary hyperparathyroidism: Secondary | ICD-10-CM

## 2020-10-19 DIAGNOSIS — M542 Cervicalgia: Secondary | ICD-10-CM | POA: Diagnosis not present

## 2020-10-19 DIAGNOSIS — R262 Difficulty in walking, not elsewhere classified: Secondary | ICD-10-CM

## 2020-10-19 DIAGNOSIS — R252 Cramp and spasm: Secondary | ICD-10-CM

## 2020-10-19 DIAGNOSIS — M545 Low back pain, unspecified: Secondary | ICD-10-CM

## 2020-10-19 DIAGNOSIS — G8929 Other chronic pain: Secondary | ICD-10-CM

## 2020-10-19 NOTE — Therapy (Signed)
[place]. [place], [place], [address] Phone: [phone]   Fax:  [phone]  Physical Therapy Treatment  Patient Details  Name: Madison Palmer MRN: [id number] Date of Birth: 1965-10-30 Referring Provider (PT): Esmeralda Arthur Date: 10/19/2020   PT End of Session - 10/19/20 1341     Visit Number 13    Date for PT Re-Evaluation 11/09/20    PT Start Time 1311    PT Stop Time 1345    PT Time Calculation (min) 34 min    Activity Tolerance Patient tolerated treatment well    Behavior During Therapy [facility] for tasks assessed/performed             Past Medical History:  Diagnosis Date   Acute on chronic respiratory failure with hypoxia and hypercapnia ([place]) 09/15/2018   Anemia    Anxiety    Arrhythmia    tachycardia   Arthritis    Chickenpox    Chronic respiratory failure with hypoxia ([place]) 05/10/2018   Formatting of this note might be different from the original. Last Assessment & Plan:  Continue 1 L continuous on exertion and during sleep. On her next visit we will check OSA on CPAP/room air to decide whether she needs to take oxygen along with her on her cruise in May   Depression    Diverticulitis    GERD (gastroesophageal reflux disease)    Glaucoma    Hyperlipidemia    Hyperparathyroidism ([place])    Hypertension    Inflammatory polyps of colon ([place])    LVH (left ventricular hypertrophy)    Lymphedema    PCOS (polycystic ovarian syndrome)    Prediabetes    Recurrent UTI    Sleep apnea    CPAP   Thyroid disease    Vitamin D deficiency     Past Surgical History:  Procedure Laterality Date   BREAST BIOPSY  2015   CESAREAN SECTION  2004   INNER EAR SURGERY     ear and sinus surgery   LAPAROSCOPIC REPAIR AND REMOVAL OF GASTRIC BAND     OOPHORECTOMY Left    TONSILLECTOMY AND ADENOIDECTOMY      There were no vitals filed for this visit.   Subjective Assessment - 10/19/20 1312      Subjective "Knees are bothering me" Will start the first set of gel shots tomorrow. Shoulders annoying her when she sleeps, Lower back on  R side giving her problems    Currently in Pain? Yes    Pain Location Back    Pain Orientation Right;Lower                               [facility] Adult PT Treatment/Exercise - 10/19/20 0001       Neck Exercises: Standing   Other Standing Exercises AAROM Flex, Ext, IR up back      Neck Exercises: Seated   Neck Retraction 20 reps;3 secs    Shoulder Flexion Both;Weights;20 reps    Shoulder Flexion Weights (lbs) 2    Shoulder ABduction Both;10 reps    Shoulder Abduction Weights (lbs) 2    Other Seated Exercise Horiz Abd yellow 2x10      Lumbar Exercises: Standing   Shoulder Extension Theraband;Strengthening;Both;20 reps    Theraband Level (Shoulder Extension) Level 4 (Blue)      Lumbar Exercises: Seated   Other Seated Lumbar Exercises Rows Blue Tband 2x15  Shoulder Exercises: Seated   Other Seated Exercises 3lb bicep curls 2x15    Other Seated Exercises Tricep Ext red x15 each                      PT Short Term Goals - 08/30/20 1419       PT SHORT TERM GOAL #1   Title independent with initial HEP    Status Partially Met               PT Long Term Goals - 10/14/20 1216       PT LONG TERM GOAL #2   Title increase lumbar ROM 25%    Status Achieved                   Plan - 10/19/20 1341     Clinical Impression Statement Pt ~ 11 minutes late for today's session. She enters reporting pain in the low back and knees. Shoulder/neck pain and discomfort does increase with activity. She does report that she is starting to recover quicker after therapy regards to soreness. increase resistance tolerated with rows and extensions.    Personal Factors and Comorbidities Comorbidity 2    Comorbidities obesity, lymphedema    Examination-Activity Limitations Locomotion Level;Stand;Stairs     Stability/Clinical Decision Making Stable/Uncomplicated    Rehab Potential Fair    PT Frequency 2x / week    PT Duration 8 weeks    PT Treatment/Interventions ADLs/Self Care Home Management;Cryotherapy;Electrical Stimulation;Moist Heat;Functional mobility training;Therapeutic activities;Therapeutic exercise;Balance training;Neuromuscular re-education;Manual techniques;Patient/family education;Dry needling;Gait training;Iontophoresis 38m/ml Dexamethasone    PT Next Visit Plan Multi joint interventions, functional strengthening/endurance ex's, manual/modalities as indicated             Patient will benefit from skilled therapeutic intervention in order to improve the following deficits and impairments:  Abnormal gait, Decreased range of motion, Difficulty walking, Increased muscle spasms, Pain, Impaired flexibility, Improper body mechanics, Decreased strength, Decreased mobility, Obesity, Cardiopulmonary status limiting activity, Decreased endurance  Visit Diagnosis: Difficulty in walking, not elsewhere classified  Cramp and spasm  Chronic bilateral low back pain without sciatica  Neck pain     Problem List Patient Active Problem List   Diagnosis Date Noted   Schizoaffective disorder, bipolar type (HC[place] 10/07/2020   DM (diabetes mellitus) (HAspen 10/07/2020   Somatic dysfunction of spine, cervical 10/06/2020   Polyphagia 09/07/2020   Restrictive lung disease 08/11/2020   Leg swelling 01/14/2019   Elevated coronary artery calcium score 01/14/2019   Chronic respiratory failure (H[place]. [place] 09/15/2018   Disease of thyroid gland 09/15/2018   Bipolar 1 disorder (HElizabethtown 09/12/2018   Prediabetes 09/05/2018   Class 3 severe obesity with serious comorbidity and body mass index (BMI) of 50.0 to 59.9 in adult (HBarron[place] 09/05/2018   Bipolar affective disorder, manic, severe, with psychotic behavior (HCl[place] 08/28/2018   Steroid-induced psychosis, 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HSlater 08/23/2018   Brief psychotic  disorder (HYeehaw Junction 08/22/2018   Bipolar I disorder, current or most recent episode manic, with psychotic features (HClim[place]    Adenomatous polyp 08/18/2018   Educated about COVID-19 virus infection 08/15/2018   Pulmonary vascular congestion 06/24/2018   Chronic diastolic heart failure (HDeal Island 05/15/2018   Dyspnea 05/15/2018   Pulmonary nodules 05/11/2018   Hypertensive urgency 05/11/2018   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HDe[place]field 04/26/2018   DDD (degenerative disc disease), lumbar 03/21/2018   Primary osteoarthritis of both knees 03/21/2018   Dysphagia 03/19/2018   PVC (premature ventricular contraction) 03/12/2018   LVH (left  ventricular hypertrophy) 03/12/2018   Generalized edema 03/12/2018   Bilateral carotid artery stenosis 03/12/2018   Weight gain 01/25/2018   Hemorrhoids 01/05/2018   Generalized postprandial abdominal pain 01/05/2018   Hiatal hernia 01/05/2018   Esophageal dysmotilities 01/05/2018   Mixed hyperlipidemia 09/12/2017   Multinodular goiter 07/27/2017   Hyperparathyroidism, primary ([place]) 06/13/2017   Vitamin D deficiency 06/13/2017   History of colonic polyps 05/15/2017   Eustachian tube dysfunction, right 05/15/2017   Lymphatic edema 05/15/2017   Anemia 05/11/2017   Arrhythmia 05/11/2017   Diverticulosis 05/11/2017   GERD (gastroesophageal reflux disease) 05/11/2017   Hypothyroidism 05/11/2017   Lower extremity edema 05/11/2017   Morbid (severe) obesity due to excess calories ([place]) 05/11/2017   Polycystic ovary syndrome 05/11/2017   Obstructive sleep apnea 05/11/2017   Neck pain 05/11/2017   Allergic rhinitis 05/11/2017   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 [place], [place], [address] Phone: [phone]   Fax:  [phone]  Name: Madison Palmer MRN:  [id number] Date of Birth: [birth year]

## 2020-10-20 ENCOUNTER — Encounter (INDEPENDENT_AMBULATORY_CARE_PROVIDER_SITE_OTHER): Payer: Self-pay | Admitting: Family Medicine

## 2020-10-20 ENCOUNTER — Ambulatory Visit (INDEPENDENT_AMBULATORY_CARE_PROVIDER_SITE_OTHER): Payer: BC Managed Care – PPO | Admitting: Family Medicine

## 2020-10-20 VITALS — BP 163/96 | HR 82 | Temp 98.9°F | Ht 63.0 in | Wt 320.0 lb

## 2020-10-20 DIAGNOSIS — R7303 Prediabetes: Secondary | ICD-10-CM

## 2020-10-20 DIAGNOSIS — I10 Essential (primary) hypertension: Secondary | ICD-10-CM

## 2020-10-20 DIAGNOSIS — Z6841 Body Mass Index (BMI) 40.0 and over, adult: Secondary | ICD-10-CM

## 2020-10-21 ENCOUNTER — Encounter: Payer: Self-pay | Admitting: Physical Therapy

## 2020-10-21 ENCOUNTER — Other Ambulatory Visit: Payer: Self-pay

## 2020-10-21 ENCOUNTER — Ambulatory Visit: Payer: BC Managed Care – PPO | Admitting: Physical Therapy

## 2020-10-21 DIAGNOSIS — R252 Cramp and spasm: Secondary | ICD-10-CM

## 2020-10-21 DIAGNOSIS — G8929 Other chronic pain: Secondary | ICD-10-CM

## 2020-10-21 DIAGNOSIS — M542 Cervicalgia: Secondary | ICD-10-CM

## 2020-10-21 DIAGNOSIS — M6281 Muscle weakness (generalized): Secondary | ICD-10-CM

## 2020-10-21 DIAGNOSIS — R262 Difficulty in walking, not elsewhere classified: Secondary | ICD-10-CM

## 2020-10-21 NOTE — Therapy (Signed)
[place]. [place], [place], [address] Phone: [phone]   Fax:  [phone]  Physical Therapy Treatment  Patient Details  Name: Madison Palmer MRN: [id number] Date of Birth: 1965-08-18 Referring Provider (PT): Esmeralda Arthur Date: 10/21/2020   PT End of Session - 10/21/20 1343     Visit Number 14    Date for PT Re-Evaluation 11/09/20    PT Start Time 1306    PT Stop Time 1345    PT Time Calculation (min) 39 min    Activity Tolerance Patient tolerated treatment well    Behavior During Therapy [facility] for tasks assessed/performed             Past Medical History:  Diagnosis Date   Acute on chronic respiratory failure with hypoxia and hypercapnia ([place]) 09/15/2018   Anemia    Anxiety    Arrhythmia    tachycardia   Arthritis    Chickenpox    Chronic respiratory failure with hypoxia ([place]) 05/10/2018   Formatting of this note might be different from the original. Last Assessment & Plan:  Continue 1 L continuous on exertion and during sleep. On her next visit we will check OSA on CPAP/room air to decide whether she needs to take oxygen along with her on her cruise in May   Depression    Diverticulitis    GERD (gastroesophageal reflux disease)    Glaucoma    Hyperlipidemia    Hyperparathyroidism ([place])    Hypertension    Inflammatory polyps of colon ([place])    LVH (left ventricular hypertrophy)    Lymphedema    PCOS (polycystic ovarian syndrome)    Prediabetes    Recurrent UTI    Sleep apnea    CPAP   Thyroid disease    Vitamin D deficiency     Past Surgical History:  Procedure Laterality Date   BREAST BIOPSY  2015   CESAREAN SECTION  2004   INNER EAR SURGERY     ear and sinus surgery   LAPAROSCOPIC REPAIR AND REMOVAL OF GASTRIC BAND     OOPHORECTOMY Left    TONSILLECTOMY AND ADENOIDECTOMY      There were no vitals filed for this visit.   Subjective Assessment - 10/21/20 1307      Subjective "All right, knees are a little sore because of the shots yesterday"    Currently in Pain? Yes    Pain Score 6     Pain Location Knee    Pain Orientation Left;Right                               [facility] Adult PT Treatment/Exercise - 10/21/20 0001       Neck Exercises: Machines for Strengthening   UBE (Upper Arm Bike) L2.3 3 min each      Neck Exercises: Seated   Shoulder Flexion Both;20 reps   WaTE bar   Shoulder Flexion Weights (lbs) 3    Other Seated Exercise ER yellow 2x10; 3lb chest press 2x10      Shoulder Exercises: Seated   Other Seated Exercises 3lb bicep curls 2x15    Other Seated Exercises Tricep Ext red 2x15 each      Shoulder Exercises: Standing   Extension Strengthening;Both;20 reps;Weights    Extension Weight (lbs) 5    Other Standing Exercises OHP 2lb 2x10  PT Short Term Goals - 08/30/20 1419       PT SHORT TERM GOAL #1   Title independent with initial HEP    Status Partially Met               PT Long Term Goals - 10/21/20 1325       PT LONG TERM GOAL #1   Title understand proper body mechanics for ADL's    Status Partially Met      PT LONG TERM GOAL #2   Title increase lumbar ROM 25%    Status Achieved      PT LONG TERM GOAL #3   Title decrease pain 50%    Status Partially Met      PT LONG TERM GOAL #4   Status Achieved                   Plan - 10/21/20 1345     Clinical Impression Statement Pt ~ 6 minutes late. Added more UE strengthening interventions in different planes. Some increase LBP reported with standing rows and extension with pulley machine. OHP and did cause pt to report some discomfort in the rhomboid. Tactile cues to both elbows for arm positioning needed with ER.    Personal Factors and Comorbidities Comorbidity 2    Comorbidities obesity, lymphedema    Examination-Activity Limitations Locomotion Level;Stand;Stairs    Examination-Participation  Restrictions Community Activity;Interpersonal Relationship    Stability/Clinical Decision Making Stable/Uncomplicated    Rehab Potential Fair    PT Frequency 2x / week    PT Duration 8 weeks    PT Treatment/Interventions ADLs/Self Care Home Management;Cryotherapy;Electrical Stimulation;Moist Heat;Functional mobility training;Therapeutic activities;Therapeutic exercise;Balance training;Neuromuscular re-education;Manual techniques;Patient/family education;Dry needling;Gait training;Iontophoresis 55m/ml Dexamethasone    PT Next Visit Plan Multi joint interventions, functional strengthening/endurance ex's, manual/modalities as indicated             Patient will benefit from skilled therapeutic intervention in order to improve the following deficits and impairments:  Abnormal gait, Decreased range of motion, Difficulty walking, Increased muscle spasms, Pain, Impaired flexibility, Improper body mechanics, Decreased strength, Decreased mobility, Obesity, Cardiopulmonary status limiting activity, Decreased endurance  Visit Diagnosis: Difficulty in walking, not elsewhere classified  Cramp and spasm  Chronic bilateral low back pain without sciatica  Muscle weakness (generalized)  Neck pain     Problem List Patient Active Problem List   Diagnosis Date Noted   Schizoaffective disorder, bipolar type ([place] 10/07/2020   DM (diabetes mellitus) ([place] 10/07/2020   Somatic dysfunction of spine, cervical 10/06/2020   Polyphagia 09/07/2020   Restrictive lung disease 08/11/2020   Leg swelling 01/14/2019   Elevated coronary artery calcium score 01/14/2019   Chronic respiratory failure ([place] 09/15/2018   Disease of thyroid gland 09/15/2018   Bipolar 1 disorder ([place] 09/12/2018   Prediabetes 09/05/2018   Class 3 severe obesity with serious comorbidity and body mass index (BMI) of 50.0 to 59.9 in adult ([place] 09/05/2018   Bipolar affective disorder, manic, severe, with psychotic behavior ([place]  08/28/2018   Steroid-induced psychosis, 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[place] 08/23/2018   Brief psychotic disorder ([place] 08/22/2018   Bipolar I disorder, current or most recent episode manic, with psychotic features ([place]    Adenomatous polyp 08/18/2018   Educated about COVID-19 virus infection 08/15/2018   Pulmonary vascular congestion 06/24/2018   Chronic diastolic heart failure ([place] 05/15/2018   Dyspnea 05/15/2018   Pulmonary nodules 05/11/2018   Hypertensive urgency 05/11/2018   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[place] 04/26/2018  DDD (degenerative disc disease), lumbar 03/21/2018   Primary osteoarthritis of both knees 03/21/2018   Dysphagia 03/19/2018   PVC (premature ventricular contraction) 03/12/2018   LVH (left ventricular hypertrophy) 03/12/2018   Generalized edema 03/12/2018   Bilateral carotid artery stenosis 03/12/2018   Weight gain 01/25/2018   Hemorrhoids 01/05/2018   Generalized postprandial abdominal pain 01/05/2018   Hiatal hernia 01/05/2018   Esophageal dysmotilities 01/05/2018   Mixed hyperlipidemia 09/12/2017   Multinodular goiter 07/27/2017   Hyperparathyroidism, primary ([place]) 06/13/2017   Vitamin D deficiency 06/13/2017   History of colonic polyps 05/15/2017   Eustachian tube dysfunction, right 05/15/2017   Lymphatic edema 05/15/2017   Anemia 05/11/2017   Arrhythmia 05/11/2017   Diverticulosis 05/11/2017   GERD (gastroesophageal reflux disease) 05/11/2017   Hypothyroidism 05/11/2017   Lower extremity edema 05/11/2017   Morbid (severe) obesity due to excess calories ([place]) 05/11/2017   Polycystic ovary syndrome 05/11/2017   Obstructive sleep apnea 05/11/2017   Neck pain 05/11/2017   Allergic rhinitis 05/11/2017   Ossification of posterior longitudinal ligament ([place]) 08/28/2012   Essential hypertension 05/01/1990    Scot Jun 10/21/2020, 1:55 PM  [place]. [place], [place], [address] Phone: [phone]   Fax:  [phone]  Name: LEESA LEIFHEIT MRN: [id number] Date of Birth: 08/10/1965

## 2020-10-21 NOTE — Progress Notes (Signed)
Chief Complaint:   OBESITY Madison Palmer is here to discuss her progress with her obesity treatment plan along with follow-up of her obesity related diagnoses. Madison Palmer is on the Category 3 Plan and keeping a food journal and adhering to recommended goals of 1600 calories and 90 g protein and states she is following her eating plan approximately 40% of the time. Madison Palmer states she is not currently exercising.  Today's visit was #: 71 Starting weight: 357 lbs Starting date: 02/27/2018 Today's weight: 320 lbs Today's date: 10/21/2020 Total lbs lost to date: 37 Total lbs lost since last in-office visit: 2  Interim History: Madison Palmer has been vacationing at [place] for a few days, then saw/went to her parents home in [place] in preparation to sale it. She saw her doctor at [facility] for further work-up on elevated calcium. She has logged [phone] cal/day with >100 grams protein/day. She is occasionally feeling not quite filled. She does acknowledge she could drink more water. She is going to [facility] July 6-8.   Subjective:   1. Essential hypertension Madison Palmer's BP is elevated today. Pt denies chest pain/chest pressure/headache. She is on Labetalol, lisinopril, catapres, and Norvasc. She has been increasing exertion lately.  2. Pre-diabetes Madison Palmer is on Metformin and denies GI side effects.  Assessment/Plan:   1. Essential hypertension Madison Palmer is working on healthy weight loss and exercise to improve blood pressure control. We will watch for signs of hypotension as she continues her lifestyle modifications. Continue current treatment plan.  2. Pre-diabetes Madison Palmer will continue to work on weight loss, exercise, and decreasing simple carbohydrates to help decrease the risk of diabetes. Continue Metformin with no change in dose.  3. Class 3 severe obesity with serious comorbidity and body mass index (BMI) of 60.0 to 69.9 in adult, unspecified obesity type (HCC)  Madison Palmer is currently in the  action stage of change. As such, her goal is to continue with weight loss efforts. She has agreed to the Category 3 Plan and keeping a food journal and adhering to recommended goals of 1600 calories and 90+ g protein.   Exercise goals: All adults should avoid inactivity. Some physical activity is better than none, and adults who participate in any amount of physical activity gain some health benefits.  Behavioral modification strategies: increasing lean protein intake, meal planning and cooking strategies, keeping healthy foods in the home, and planning for success.  Madison Palmer has agreed to follow-up with our clinic in 3-4 weeks. She was informed of the importance of frequent follow-up visits to maximize her success with intensive lifestyle modifications for her multiple health conditions.   Objective:   Blood pressure (!) 163/96, pulse 82, temperature 98.9 F (37.2 C), height 5\' 3"  (1.6 m), weight (!) 320 lb (145.2 kg), last menstrual period 05/02/2017, SpO2 96 %. Body mass index is 56.69 kg/m.  General: Cooperative, alert, well developed, in no acute distress. HEENT: Conjunctivae and lids unremarkable. Cardiovascular: Regular rhythm.  Lungs: Normal work of breathing. Neurologic: No focal deficits.   Lab Results  Component Value Date   CREATININE 1.12 (H) 09/01/2020   BUN 24 09/01/2020   NA 142 09/01/2020   K 4.4 09/01/2020   CL 102 09/01/2020   CO2 24 09/01/2020   Lab Results  Component Value Date   ALT 11 06/10/2020   AST 12 06/10/2020   ALKPHOS 90 06/10/2020   BILITOT 0.3 06/10/2020   Lab Results  Component Value Date   HGBA1C 5.6 06/01/2020   HGBA1C 5.7 (  H) 12/11/2019   HGBA1C 5.7 (H) 12/24/2018   HGBA1C 6.0 (H) 02/27/2018   HGBA1C 5.7 09/10/2017   Lab Results  Component Value Date   INSULIN 18.9 06/01/2020   INSULIN 11.4 12/11/2019   INSULIN 21.1 12/30/2018   INSULIN 9.6 02/27/2018   Lab Results  Component Value Date   TSH 2.340 09/01/2020   Lab Results   Component Value Date   CHOL 216 (H) 06/10/2020   HDL 50 06/10/2020   LDLCALC 138 (H) 06/10/2020   TRIG 154 (H) 06/10/2020   CHOLHDL 5.6 (H) 12/24/2018   Lab Results  Component Value Date   WBC 5.4 09/25/2018   HGB 12.7 09/25/2018   HCT 40.4 09/25/2018   MCV 94.6 09/25/2018   PLT 229 09/25/2018   Lab Results  Component Value Date   IRON 44 02/20/2018   TIBC 347 05/29/2017    Attestation Statements:   Reviewed by clinician on day of visit: allergies, medications, problem list, medical history, surgical history, family history, social history, and previous encounter notes.  Time spent on visit including pre-visit chart review and post-visit care and charting was 15 minutes.   Coral Ceo, CMA, am acting as transcriptionist for Coralie Common, MD.   I have reviewed the above documentation for accuracy and completeness, and I agree with the above. - Jinny Blossom, MD

## 2020-10-26 ENCOUNTER — Ambulatory Visit: Payer: BC Managed Care – PPO | Admitting: Physical Therapy

## 2020-10-26 ENCOUNTER — Other Ambulatory Visit: Payer: Self-pay

## 2020-10-26 ENCOUNTER — Encounter: Payer: Self-pay | Admitting: Physical Therapy

## 2020-10-26 DIAGNOSIS — M6281 Muscle weakness (generalized): Secondary | ICD-10-CM

## 2020-10-26 DIAGNOSIS — M542 Cervicalgia: Secondary | ICD-10-CM | POA: Diagnosis not present

## 2020-10-26 DIAGNOSIS — R262 Difficulty in walking, not elsewhere classified: Secondary | ICD-10-CM

## 2020-10-26 DIAGNOSIS — G8929 Other chronic pain: Secondary | ICD-10-CM

## 2020-10-26 DIAGNOSIS — R252 Cramp and spasm: Secondary | ICD-10-CM

## 2020-10-26 NOTE — Therapy (Signed)
[place]. [place], [place], [address] Phone: [phone]   Fax:  [phone]  Physical Therapy Treatment  Patient Details  Name: Madison Palmer MRN: [id number] Date of Birth: Jan 12, 1966 Referring Provider (PT): Esmeralda Arthur Date: 10/26/2020   PT End of Session - 10/26/20 1332     Visit Number 15    Date for PT Re-Evaluation 11/09/20    PT Start Time 1316    PT Stop Time 1338    PT Time Calculation (min) 22 min    Activity Tolerance Patient limited by pain    Behavior During Therapy [facility] for tasks assessed/performed             Past Medical History:  Diagnosis Date   Acute on chronic respiratory failure with hypoxia and hypercapnia ([place]) 09/15/2018   Anemia    Anxiety    Arrhythmia    tachycardia   Arthritis    Chickenpox    Chronic respiratory failure with hypoxia ([place]) 05/10/2018   Formatting of this note might be different from the original. Last Assessment & Plan:  Continue 1 L continuous on exertion and during sleep. On her next visit we will check OSA on CPAP/room air to decide whether she needs to take oxygen along with her on her cruise in May   Depression    Diverticulitis    GERD (gastroesophageal reflux disease)    Glaucoma    Hyperlipidemia    Hyperparathyroidism ([place])    Hypertension    Inflammatory polyps of colon ([place])    LVH (left ventricular hypertrophy)    Lymphedema    PCOS (polycystic ovarian syndrome)    Prediabetes    Recurrent UTI    Sleep apnea    CPAP   Thyroid disease    Vitamin D deficiency     Past Surgical History:  Procedure Laterality Date   BREAST BIOPSY  2015   CESAREAN SECTION  2004   INNER EAR SURGERY     ear and sinus surgery   LAPAROSCOPIC REPAIR AND REMOVAL OF GASTRIC BAND     OOPHORECTOMY Left    TONSILLECTOMY AND ADENOIDECTOMY      There were no vitals filed for this visit.   Subjective Assessment - 10/26/20 1321     Subjective Pt  enters clinic reporting increase low back pain on R side. Pt reports difficulty walking and moving around.    Currently in Pain? Yes    Pain Score 10-Worst pain ever    Pain Location Back    Pain Orientation Right                               [facility] Adult PT Treatment/Exercise - 10/26/20 0001       Electrical Stimulation   Electrical Stimulation Location Lower back    Electrical Stimulation Action IFC    Electrical Stimulation Parameters Sitting to pt tolerance    Electrical Stimulation Goals Pain                      PT Short Term Goals - 08/30/20 1419       PT SHORT TERM GOAL #1   Title independent with initial HEP    Status Partially Met               PT Long Term Goals - 10/21/20 1325       PT  LONG TERM GOAL #1   Title understand proper body mechanics for ADL's    Status Partially Met      PT LONG TERM GOAL #2   Title increase lumbar ROM 25%    Status Achieved      PT LONG TERM GOAL #3   Title decrease pain 50%    Status Partially Met      PT LONG TERM GOAL #4   Status Achieved                   Plan - 10/26/20 1333     Clinical Impression Statement Pt ~16 minute late reporting increase low back pain on the R side. She reports no knows reason. She stated pain in the area before, but not at this intensity. E-Stim and MHP to help with pain,    Personal Factors and Comorbidities Comorbidity 2    Comorbidities obesity, lymphedema    Examination-Activity Limitations Locomotion Level;Stand;Stairs    Examination-Participation Restrictions Community Activity;Interpersonal Relationship    Stability/Clinical Decision Making Stable/Uncomplicated    Rehab Potential Fair    PT Frequency 2x / week    PT Duration 8 weeks    PT Treatment/Interventions ADLs/Self Care Home Management;Cryotherapy;Electrical Stimulation;Moist Heat;Functional mobility training;Therapeutic activities;Therapeutic exercise;Balance  training;Neuromuscular re-education;Manual techniques;Patient/family education;Dry needling;Gait training;Iontophoresis 91m/ml Dexamethasone    PT Next Visit Plan Multi joint interventions, functional strengthening/endurance ex's, manual/modalities as indicated             Patient will benefit from skilled therapeutic intervention in order to improve the following deficits and impairments:  Abnormal gait, Decreased range of motion, Difficulty walking, Increased muscle spasms, Pain, Impaired flexibility, Improper body mechanics, Decreased strength, Decreased mobility, Obesity, Cardiopulmonary status limiting activity, Decreased endurance  Visit Diagnosis: Cramp and spasm  Chronic bilateral low back pain without sciatica  Muscle weakness (generalized)  Difficulty in walking, not elsewhere classified  Neck pain     Problem List Patient Active Problem List   Diagnosis Date Noted   Schizoaffective disorder, bipolar type (HW[place] 10/07/2020   DM (diabetes mellitus) (HL[place] 10/07/2020   Somatic dysfunction of spine, cervical 10/06/2020   Polyphagia 09/07/2020   Restrictive lung disease 08/11/2020   Leg swelling 01/14/2019   Elevated coronary artery calcium score 01/14/2019   Chronic respiratory failure (HL[place] 09/15/2018   Disease of thyroid gland 09/15/2018   Bipolar 1 disorder (H[place] 09/12/2018   Prediabetes 09/05/2018   Class 3 severe obesity with serious comorbidity and body mass index (BMI) of 50.0 to 59.9 in adult ([place] 09/05/2018   Bipolar affective disorder, manic, severe, with psychotic behavior (H[place] 08/28/2018   Steroid-induced psychosis, 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H[place] 08/23/2018   Brief psychotic disorder (H[place] 08/22/2018   Bipolar I disorder, current or most recent episode manic, with psychotic features (HS[place]    Adenomatous polyp 08/18/2018   Educated about COVID-19 virus infection 08/15/2018   Pulmonary vascular congestion 06/24/2018   Chronic diastolic heart failure ([place]  05/15/2018   Dyspnea 05/15/2018   Pulmonary nodules 05/11/2018   Hypertensive urgency 05/11/2018   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HB[place] 04/26/2018   DDD (degenerative disc disease), lumbar 03/21/2018   Primary osteoarthritis of both knees 03/21/2018   Dysphagia 03/19/2018   PVC (premature ventricular contraction) 03/12/2018   LVH (left ventricular hypertrophy) 03/12/2018   Generalized edema 03/12/2018   Bilateral carotid artery stenosis 03/12/2018   Weight gain 01/25/2018   Hemorrhoids 01/05/2018   Generalized postprandial abdominal pain 01/05/2018   Hiatal hernia 01/05/2018   Esophageal dysmotilities 01/05/2018  Mixed hyperlipidemia 09/12/2017   Multinodular goiter 07/27/2017   Hyperparathyroidism, primary ([place]) 06/13/2017   Vitamin D deficiency 06/13/2017   History of colonic polyps 05/15/2017   Eustachian tube dysfunction, right 05/15/2017   Lymphatic edema 05/15/2017   Anemia 05/11/2017   Arrhythmia 05/11/2017   Diverticulosis 05/11/2017   GERD (gastroesophageal reflux disease) 05/11/2017   Hypothyroidism 05/11/2017   Lower extremity edema 05/11/2017   Morbid (severe) obesity due to excess calories ([place]) 05/11/2017   Polycystic ovary syndrome 05/11/2017   Obstructive sleep apnea 05/11/2017   Neck pain 05/11/2017   Allergic rhinitis 05/11/2017   Ossification of posterior longitudinal ligament ([place]) 08/28/2012   Essential hypertension 05/01/1990    Scot Jun 10/26/2020, 1:35 PM  [place]. [place], [place], [address] Phone: [phone]   Fax:  [phone]  Name: Madison Palmer MRN: [id number] Date of Birth: 26-Oct-1965

## 2020-10-28 ENCOUNTER — Encounter: Payer: Self-pay | Admitting: Physical Therapy

## 2020-10-28 ENCOUNTER — Other Ambulatory Visit: Payer: Self-pay

## 2020-10-28 ENCOUNTER — Ambulatory Visit: Payer: BC Managed Care – PPO | Admitting: Physical Therapy

## 2020-10-28 DIAGNOSIS — M545 Low back pain, unspecified: Secondary | ICD-10-CM

## 2020-10-28 DIAGNOSIS — R262 Difficulty in walking, not elsewhere classified: Secondary | ICD-10-CM

## 2020-10-28 DIAGNOSIS — R252 Cramp and spasm: Secondary | ICD-10-CM

## 2020-10-28 DIAGNOSIS — M6281 Muscle weakness (generalized): Secondary | ICD-10-CM

## 2020-10-28 DIAGNOSIS — G8929 Other chronic pain: Secondary | ICD-10-CM

## 2020-10-28 DIAGNOSIS — M542 Cervicalgia: Secondary | ICD-10-CM | POA: Diagnosis not present

## 2020-10-28 NOTE — Therapy (Signed)
[place]. [place], [place], [address] Phone: [phone]   Fax:  [phone]  Physical Therapy Treatment  Patient Details  Name: Madison Palmer MRN: [id number] Date of Birth: 12/15/1965 Referring Provider (PT): Esmeralda Arthur Date: 10/28/2020   PT End of Session - 10/28/20 1348     Visit Number 16    Date for PT Re-Evaluation 11/09/20    PT Start Time 1310    PT Stop Time 1346    PT Time Calculation (min) 36 min             Past Medical History:  Diagnosis Date   Acute on chronic respiratory failure with hypoxia and hypercapnia ([place]) 09/15/2018   Anemia    Anxiety    Arrhythmia    tachycardia   Arthritis    Chickenpox    Chronic respiratory failure with hypoxia ([place]) 05/10/2018   Formatting of this note might be different from the original. Last Assessment & Plan:  Continue 1 L continuous on exertion and during sleep. On her next visit we will check OSA on CPAP/room air to decide whether she needs to take oxygen along with her on her cruise in May   Depression    Diverticulitis    GERD (gastroesophageal reflux disease)    Glaucoma    Hyperlipidemia    Hyperparathyroidism ([place])    Hypertension    Inflammatory polyps of colon ([place])    LVH (left ventricular hypertrophy)    Lymphedema    PCOS (polycystic ovarian syndrome)    Prediabetes    Recurrent UTI    Sleep apnea    CPAP   Thyroid disease    Vitamin D deficiency     Past Surgical History:  Procedure Laterality Date   BREAST BIOPSY  2015   CESAREAN SECTION  2004   INNER EAR SURGERY     ear and sinus surgery   LAPAROSCOPIC REPAIR AND REMOVAL OF GASTRIC BAND     OOPHORECTOMY Left    TONSILLECTOMY AND ADENOIDECTOMY      There were no vitals filed for this visit.   Subjective Assessment - 10/28/20 1310     Subjective "Better but still hurt" "knees are hurting but I got my shots yesterday" functional but has been taking Tyenol     Currently in Pain? Yes    Pain Score 6     Pain Location Back    Pain Orientation Right                               [facility] Adult PT Treatment/Exercise - 10/28/20 0001       Neck Exercises: Machines for Strengthening   UBE (Upper Arm Bike) L2.1 x 2 each    Nustep L4 x 3 min      Shoulder Exercises: Seated   Row Theraband;Both;15 reps   1 set red theraband, 1 set blue theraband   Horizontal ABduction Both;10 reps   2 sets   Theraband Level (Shoulder Horizontal ABduction) Level 3 (Green)    External Rotation Both;10 reps;Theraband   2 sets   Theraband Level (Shoulder External Rotation) Level 3 (Green)    Flexion Both;15 reps   3# bar 2 sets   Abduction Both;Strengthening;10 reps;Weights   2 sets   ABduction Weight (lbs) 2#    Other Seated Exercises 4lbs bicep curls 2x10    Other Seated Exercises tricep ext  blue theraband 2x10                      PT Short Term Goals - 08/30/20 1419       PT SHORT TERM GOAL #1   Title independent with initial HEP    Status Partially Met               PT Long Term Goals - 10/21/20 1325       PT LONG TERM GOAL #1   Title understand proper body mechanics for ADL's    Status Partially Met      PT LONG TERM GOAL #2   Title increase lumbar ROM 25%    Status Achieved      PT LONG TERM GOAL #3   Title decrease pain 50%    Status Partially Met      PT LONG TERM GOAL #4   Status Achieved                   Plan - 10/28/20 1348     Clinical Impression Statement Pt ~ 10 minutes late for today's session. Again she enters clinic with back pain on R side but not as intense as last session. All interventions performed in the seated position to take pressure off low back. Postural cues needed with sated rows. She does have normal shoulder fatigue as reps increase.    Personal Factors and Comorbidities Comorbidity 2    Comorbidities obesity, lymphedema    Examination-Activity Limitations Locomotion  Level;Stand;Stairs    Examination-Participation Restrictions Community Activity;Interpersonal Relationship    Stability/Clinical Decision Making Stable/Uncomplicated    Rehab Potential Fair    PT Frequency 2x / week    PT Duration 8 weeks    PT Treatment/Interventions ADLs/Self Care Home Management;Cryotherapy;Electrical Stimulation;Moist Heat;Functional mobility training;Therapeutic activities;Therapeutic exercise;Balance training;Neuromuscular re-education;Manual techniques;Patient/family education;Dry needling;Gait training;Iontophoresis 25m/ml Dexamethasone    PT Next Visit Plan Multi joint interventions, functional strengthening/endurance ex's, manual/modalities as indicated             Patient will benefit from skilled therapeutic intervention in order to improve the following deficits and impairments:  Abnormal gait, Decreased range of motion, Difficulty walking, Increased muscle spasms, Pain, Impaired flexibility, Improper body mechanics, Decreased strength, Decreased mobility, Obesity, Cardiopulmonary status limiting activity, Decreased endurance  Visit Diagnosis: Cramp and spasm  Difficulty in walking, not elsewhere classified  Chronic bilateral low back pain without sciatica  Neck pain  Muscle weakness (generalized)     Problem List Patient Active Problem List   Diagnosis Date Noted   Schizoaffective disorder, bipolar type ([facility] 10/07/2020   DM (diabetes mellitus) ([facility] 10/07/2020   Somatic dysfunction of spine, cervical 10/06/2020   Polyphagia 09/07/2020   Restrictive lung disease 08/11/2020   Leg swelling 01/14/2019   Elevated coronary artery calcium score 01/14/2019   Chronic respiratory failure ([facility] 09/15/2018   Disease of thyroid gland 09/15/2018   Bipolar 1 disorder ([place] 09/12/2018   Prediabetes 09/05/2018   Class 3 severe obesity with serious comorbidity and body mass index (BMI) of 50.0 to 59.9 in adult ([facility] 09/05/2018   Bipolar affective disorder,  manic, severe, with psychotic behavior ([place] 08/28/2018   Steroid-induced psychosis, 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HE[place] 08/23/2018   Brief psychotic disorder ([facility] 08/22/2018   Bipolar I disorder, current or most recent episode manic, with psychotic features (HF[place]    Adenomatous polyp 08/18/2018   Educated about COVID-19 virus infection 08/15/2018   Pulmonary vascular congestion 06/24/2018   Chronic diastolic  heart failure ([employer]) 05/15/2018   Dyspnea 05/15/2018   Pulmonary nodules 05/11/2018   Hypertensive urgency 05/11/2018   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[place]) 04/26/2018   DDD (degenerative disc disease), lumbar 03/21/2018   Primary osteoarthritis of both knees 03/21/2018   Dysphagia 03/19/2018   PVC (premature ventricular contraction) 03/12/2018   LVH (left ventricular hypertrophy) 03/12/2018   Generalized edema 03/12/2018   Bilateral carotid artery stenosis 03/12/2018   Weight gain 01/25/2018   Hemorrhoids 01/05/2018   Generalized postprandial abdominal pain 01/05/2018   Hiatal hernia 01/05/2018   Esophageal dysmotilities 01/05/2018   Mixed hyperlipidemia 09/12/2017   Multinodular goiter 07/27/2017   Hyperparathyroidism, primary ([place]) 06/13/2017   Vitamin D deficiency 06/13/2017   History of colonic polyps 05/15/2017   Eustachian tube dysfunction, right 05/15/2017   Lymphatic edema 05/15/2017   Anemia 05/11/2017   Arrhythmia 05/11/2017   Diverticulosis 05/11/2017   GERD (gastroesophageal reflux disease) 05/11/2017   Hypothyroidism 05/11/2017   Lower extremity edema 05/11/2017   Morbid (severe) obesity due to excess calories ([place]) 05/11/2017   Polycystic ovary syndrome 05/11/2017   Obstructive sleep apnea 05/11/2017   Neck pain 05/11/2017   Allergic rhinitis 05/11/2017   Ossification of posterior longitudinal ligament ([place]) 08/28/2012   Essential hypertension 05/01/1990    Scot Jun 10/28/2020, 1:55 PM  [place]. [place], [place], [address] Phone: [phone]   Fax:  [phone]  Name: KAYLEANN MCCAFFERY MRN: [id number] Date of Birth: 10-09-1965

## 2020-11-02 ENCOUNTER — Ambulatory Visit: Payer: BC Managed Care – PPO | Attending: Family Medicine | Admitting: Physical Therapy

## 2020-11-02 ENCOUNTER — Encounter: Payer: Self-pay | Admitting: Physical Therapy

## 2020-11-02 ENCOUNTER — Other Ambulatory Visit: Payer: Self-pay

## 2020-11-02 DIAGNOSIS — M542 Cervicalgia: Secondary | ICD-10-CM

## 2020-11-02 DIAGNOSIS — G8929 Other chronic pain: Secondary | ICD-10-CM | POA: Insufficient documentation

## 2020-11-02 DIAGNOSIS — M545 Low back pain, unspecified: Secondary | ICD-10-CM | POA: Insufficient documentation

## 2020-11-02 DIAGNOSIS — M6281 Muscle weakness (generalized): Secondary | ICD-10-CM | POA: Diagnosis present

## 2020-11-02 DIAGNOSIS — R262 Difficulty in walking, not elsewhere classified: Secondary | ICD-10-CM | POA: Diagnosis present

## 2020-11-02 DIAGNOSIS — R252 Cramp and spasm: Secondary | ICD-10-CM

## 2020-11-02 NOTE — Therapy (Signed)
[place]. [place], [place], [address] Phone: [phone]   Fax:  [phone]  Physical Therapy Treatment  Patient Details  Name: Madison Palmer MRN: [id number] Date of Birth: [birth year] Referring Provider (PT): Esmeralda Arthur Date: 11/02/2020   PT End of Session - 11/02/20 1341     Visit Number 17    Date for PT Re-Evaluation 11/09/20    PT Start Time 1320    PT Stop Time 1345    PT Time Calculation (min) 25 min    Activity Tolerance Patient limited by pain    Behavior During Therapy [facility] for tasks assessed/performed             Past Medical History:  Diagnosis Date   Acute on chronic respiratory failure with hypoxia and hypercapnia ([place]) 09/15/2018   Anemia    Anxiety    Arrhythmia    tachycardia   Arthritis    Chickenpox    Chronic respiratory failure with hypoxia ([place]) 05/10/2018   Formatting of this note might be different from the original. Last Assessment & Plan:  Continue 1 L continuous on exertion and during sleep. On her next visit we will check OSA on CPAP/room air to decide whether she needs to take oxygen along with her on her cruise in May   Depression    Diverticulitis    GERD (gastroesophageal reflux disease)    Glaucoma    Hyperlipidemia    Hyperparathyroidism ([place])    Hypertension    Inflammatory polyps of colon ([place])    LVH (left ventricular hypertrophy)    Lymphedema    PCOS (polycystic ovarian syndrome)    Prediabetes    Recurrent UTI    Sleep apnea    CPAP   Thyroid disease    Vitamin D deficiency     Past Surgical History:  Procedure Laterality Date   BREAST BIOPSY  2015   CESAREAN SECTION  2004   INNER EAR SURGERY     ear and sinus surgery   LAPAROSCOPIC REPAIR AND REMOVAL OF GASTRIC BAND     OOPHORECTOMY Left    TONSILLECTOMY AND ADENOIDECTOMY      There were no vitals filed for this visit.   Subjective Assessment - 11/02/20 1311     Subjective Pt  reports that shoulders are flaring today; reports 7/10 shoulder pain and 4/10 knee pain.    Currently in Pain? Yes    Pain Score 4     Pain Location Knee    Pain Orientation Right;Left                               [facility] Adult PT Treatment/Exercise - 11/02/20 0001       Neck Exercises: Machines for Strengthening   UBE (Upper Arm Bike) L2 x 2 min each    Nustep L5 x 6 min      Lumbar Exercises: Seated   Other Seated Lumbar Exercises seated marches/LAQ 2.5# 2x10      Shoulder Exercises: Seated   Flexion Both;15 reps    Flexion Limitations 2x15 3# weight bar    Abduction Both;Strengthening;10 reps;Weights    ABduction Weight (lbs) 2#    ABduction Limitations 2x10    Other Seated Exercises 4lbs bicep curls 2x10                      PT Short Term  Goals - 08/30/20 1419       PT SHORT TERM GOAL #1   Title independent with initial HEP    Status Partially Met               PT Long Term Goals - 10/21/20 1325       PT LONG TERM GOAL #1   Title understand proper body mechanics for ADL's    Status Partially Met      PT LONG TERM GOAL #2   Title increase lumbar ROM 25%    Status Achieved      PT LONG TERM GOAL #3   Title decrease pain 50%    Status Partially Met      PT LONG TERM GOAL #4   Status Achieved                   Plan - 11/02/20 1341     Clinical Impression Statement Pt arrived ~10 min late and needed to use restroom, so abbreviated session this rx. Primary c/o increased B shoulder pain this rx. Able to tolerate gentle seated TE. Cues for form with shoulder ex's to reduce compensations. Continue to progress to tolerance.    PT Treatment/Interventions ADLs/Self Care Home Management;Cryotherapy;Electrical Stimulation;Moist Heat;Functional mobility training;Therapeutic activities;Therapeutic exercise;Balance training;Neuromuscular re-education;Manual techniques;Patient/family education;Dry needling;Gait  training;Iontophoresis 80m/ml Dexamethasone    PT Next Visit Plan Multi joint interventions, functional strengthening/endurance ex's, manual/modalities as indicated    Consulted and Agree with Plan of Care Patient             Patient will benefit from skilled therapeutic intervention in order to improve the following deficits and impairments:  Abnormal gait, Decreased range of motion, Difficulty walking, Increased muscle spasms, Pain, Impaired flexibility, Improper body mechanics, Decreased strength, Decreased mobility, Obesity, Cardiopulmonary status limiting activity, Decreased endurance  Visit Diagnosis: Difficulty in walking, not elsewhere classified  Cramp and spasm  Chronic bilateral low back pain without sciatica  Neck pain  Muscle weakness (generalized)     Problem List Patient Active Problem List   Diagnosis Date Noted   Schizoaffective disorder, bipolar type (HNorth [place] 10/07/2020   DM (diabetes mellitus) ([place] 10/07/2020   Somatic dysfunction of spine, cervical 10/06/2020   Polyphagia 09/07/2020   Restrictive lung disease 08/11/2020   Leg swelling 01/14/2019   Elevated coronary artery calcium score 01/14/2019   Chronic respiratory failure (HF[place] 09/15/2018   Disease of thyroid gland 09/15/2018   Bipolar 1 disorder (HS[place] 09/12/2018   Prediabetes 09/05/2018   Class 3 severe obesity with serious comorbidity and body mass index (BMI) of 50.0 to 59.9 in adult (HE[place] 09/05/2018   Bipolar affective disorder, manic, severe, with psychotic behavior (H[place] 08/28/2018   Steroid-induced psychosis, 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HT[place] 08/23/2018   Brief psychotic disorder (H[place] 08/22/2018   Bipolar I disorder, current or most recent episode manic, with psychotic features ([facility]    Adenomatous polyp 08/18/2018   Educated about COVID-19 virus infection 08/15/2018   Pulmonary vascular congestion 06/24/2018   Chronic diastolic heart failure (H[facility] [place] 05/15/2018   Dyspnea 05/15/2018   Pulmonary  nodules 05/11/2018   Hypertensive urgency 05/11/2018   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H[place] 04/26/2018   DDD (degenerative disc disease), lumbar 03/21/2018   Primary osteoarthritis of both knees 03/21/2018   Dysphagia 03/19/2018   PVC (premature ventricular contraction) 03/12/2018   LVH (left ventricular hypertrophy) 03/12/2018   Generalized edema 03/12/2018   Bilateral carotid artery stenosis 03/12/2018   Weight gain 01/25/2018   Hemorrhoids 01/05/2018  Generalized postprandial abdominal pain 01/05/2018   Hiatal hernia 01/05/2018   Esophageal dysmotilities 01/05/2018   Mixed hyperlipidemia 09/12/2017   Multinodular goiter 07/27/2017   Hyperparathyroidism, primary ([place]) 06/13/2017   Vitamin D deficiency 06/13/2017   History of colonic polyps 05/15/2017   Eustachian tube dysfunction, right 05/15/2017   Lymphatic edema 05/15/2017   Anemia 05/11/2017   Arrhythmia 05/11/2017   Diverticulosis 05/11/2017   GERD (gastroesophageal reflux disease) 05/11/2017   Hypothyroidism 05/11/2017   Lower extremity edema 05/11/2017   Morbid (severe) obesity due to excess calories ([place]) 05/11/2017   Polycystic ovary syndrome 05/11/2017   Obstructive sleep apnea 05/11/2017   Neck pain 05/11/2017   Allergic rhinitis 05/11/2017   Ossification of posterior longitudinal ligament ([place]) 08/28/2012   Essential hypertension 05/01/1990   Amador Cunas, PT, DPT Donald Prose Yalanda Soderman 11/02/2020, 1:43 PM  [place]. [place], [place], [address] Phone: [phone]   Fax:  [phone]  Name: RAJVI ARMENTOR MRN: [id number] Date of Birth: 1965/05/27

## 2020-11-08 ENCOUNTER — Other Ambulatory Visit: Payer: Self-pay | Admitting: Cardiology

## 2020-11-09 ENCOUNTER — Other Ambulatory Visit: Payer: Self-pay

## 2020-11-09 ENCOUNTER — Other Ambulatory Visit: Payer: Self-pay | Admitting: Nurse Practitioner

## 2020-11-09 ENCOUNTER — Ambulatory Visit: Payer: BC Managed Care – PPO | Admitting: Physical Therapy

## 2020-11-09 ENCOUNTER — Encounter: Payer: Self-pay | Admitting: Physical Therapy

## 2020-11-09 DIAGNOSIS — R262 Difficulty in walking, not elsewhere classified: Secondary | ICD-10-CM

## 2020-11-09 DIAGNOSIS — R252 Cramp and spasm: Secondary | ICD-10-CM

## 2020-11-09 DIAGNOSIS — Z1231 Encounter for screening mammogram for malignant neoplasm of breast: Secondary | ICD-10-CM

## 2020-11-09 NOTE — Therapy (Signed)
[place]. [place], [place], [address] Phone: [phone]   Fax:  [phone]  Physical Therapy Treatment  Patient Details  Name: Madison Palmer MRN: [id number] Date of Birth: July 05, 1965 Referring Provider (PT): Esmeralda Arthur Date: 11/09/2020   PT End of Session - 11/09/20 1325     Date for PT Re-Evaluation 11/09/20    PT Start Time 1309    PT Stop Time 1333    PT Time Calculation (min) 24 min    Activity Tolerance Patient limited by pain    Behavior During Therapy [facility] for tasks assessed/performed             Past Medical History:  Diagnosis Date   Acute on chronic respiratory failure with hypoxia and hypercapnia ([place]) 09/15/2018   Anemia    Anxiety    Arrhythmia    tachycardia   Arthritis    Chickenpox    Chronic respiratory failure with hypoxia ([place]) 05/10/2018   Formatting of this note might be different from the original. Last Assessment & Plan:  Continue 1 L continuous on exertion and during sleep. On her next visit we will check OSA on CPAP/room air to decide whether she needs to take oxygen along with her on her cruise in [place]   Depression    Diverticulitis    GERD (gastroesophageal reflux disease)    Glaucoma    Hyperlipidemia    Hyperparathyroidism ([place])    Hypertension    Inflammatory polyps of colon ([place])    LVH (left ventricular hypertrophy)    Lymphedema    PCOS (polycystic ovarian syndrome)    Prediabetes    Recurrent UTI    Sleep apnea    CPAP   Thyroid disease    Vitamin D deficiency     Past Surgical History:  Procedure Laterality Date   BREAST BIOPSY  2015   CESAREAN SECTION  2004   INNER EAR SURGERY     ear and sinus surgery   LAPAROSCOPIC REPAIR AND REMOVAL OF GASTRIC BAND     OOPHORECTOMY Left    TONSILLECTOMY AND ADENOIDECTOMY      There were no vitals filed for this visit.   Subjective Assessment - 11/09/20 1312     Subjective "I hurt" Intense pain int  he L shoulder.    Currently in Pain? Yes    Pain Score 8     Pain Location Shoulder    Pain Orientation Left    Pain Descriptors / Indicators Tightness;Aching                               [facility] Adult PT Treatment/Exercise - 11/09/20 0001       Moist Heat Therapy   Number Minutes Moist Heat 10 Minutes    Moist Heat Location Shoulder;Cervical                      PT Short Term Goals - 08/30/20 1419       PT SHORT TERM GOAL #1   Title independent with initial HEP    Status Partially Met               PT Long Term Goals - 11/09/20 1326       PT LONG TERM GOAL #1   Title understand proper body mechanics for ADL's    Status Partially Met  PT LONG TERM GOAL #2   Title increase lumbar ROM 25%    Status Achieved      PT LONG TERM GOAL #3   Title decrease pain 50%    Status On-going      PT LONG TERM GOAL #4   Title report able to do her normal housework without increase pain    Status Partially Met                   Plan - 11/09/20 1326     Clinical Impression Statement Pt enters clinic ~ 9 minutes late for today session increase L shoulder pain. Pt reports that she wanted to try DN today and heat. Lead PT assist in treatment session for DN. MHP applied for pain and relaxation    Personal Factors and Comorbidities Comorbidity 2    Comorbidities obesity, lymphedema    Examination-Activity Limitations Locomotion Level;Stand;Stairs    Examination-Participation Restrictions Community Activity;Interpersonal Relationship    Stability/Clinical Decision Making Stable/Uncomplicated    Rehab Potential Fair    PT Frequency 2x / week    PT Treatment/Interventions ADLs/Self Care Home Management;Cryotherapy;Electrical Stimulation;Moist Heat;Functional mobility training;Therapeutic activities;Therapeutic exercise;Balance training;Neuromuscular re-education;Manual techniques;Patient/family education;Dry needling;Gait  training;Iontophoresis 4m/ml Dexamethasone    PT Next Visit Plan Multi joint interventions, functional strengthening/endurance ex's, manual/modalities as indicated             Patient will benefit from skilled therapeutic intervention in order to improve the following deficits and impairments:  Abnormal gait, Decreased range of motion, Difficulty walking, Increased muscle spasms, Pain, Impaired flexibility, Improper body mechanics, Decreased strength, Decreased mobility, Obesity, Cardiopulmonary status limiting activity, Decreased endurance  Visit Diagnosis: Difficulty in walking, not elsewhere classified  Cramp and spasm     Problem List Patient Active Problem List   Diagnosis Date Noted   Schizoaffective disorder, bipolar type (HI[place] 10/07/2020   DM (diabetes mellitus) (HFrank[place] 10/07/2020   Somatic dysfunction of spine, cervical 10/06/2020   Polyphagia 09/07/2020   Restrictive lung disease 08/11/2020   Leg swelling 01/14/2019   Elevated coronary artery calcium score 01/14/2019   Chronic respiratory failure (HLew[place] 09/15/2018   Disease of thyroid gland 09/15/2018   Bipolar 1 disorder (HDon[place] 09/12/2018   Prediabetes 09/05/2018   Class 3 severe obesity with serious comorbidity and body mass index (BMI) of 50.0 to 59.9 in adult (HMartin 09/05/2018   Bipolar affective disorder, manic, severe, with psychotic behavior (H[place] 08/28/2018   Steroid-induced psychosis, 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HSkid[place] 08/23/2018   Brief psychotic disorder (HW[place] 08/22/2018   Bipolar I disorder, current or most recent episode manic, with psychotic features (HM[place]    Adenomatous polyp 08/18/2018   Educated about COVID-19 virus infection 08/15/2018   Pulmonary vascular congestion 06/24/2018   Chronic diastolic heart failure (HP[place] 05/15/2018   Dyspnea 05/15/2018   Pulmonary nodules 05/11/2018   Hypertensive urgency 05/11/2018   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HCot[place] 04/26/2018   DDD (degenerative disc  disease), lumbar 03/21/2018   Primary osteoarthritis of both knees 03/21/2018   Dysphagia 03/19/2018   PVC (premature ventricular contraction) 03/12/2018   LVH (left ventricular hypertrophy) 03/12/2018   Generalized edema 03/12/2018   Bilateral carotid artery stenosis 03/12/2018   Weight gain 01/25/2018   Hemorrhoids 01/05/2018   Generalized postprandial abdominal pain 01/05/2018   Hiatal hernia 01/05/2018   Esophageal dysmotilities 01/05/2018   Mixed hyperlipidemia 09/12/2017   Multinodular goiter 07/27/2017   Hyperparathyroidism, primary (HR[place] 06/13/2017   Vitamin D deficiency 06/13/2017   History of colonic polyps 05/15/2017  Eustachian tube dysfunction, right 05/15/2017   Lymphatic edema 05/15/2017   Anemia 05/11/2017   Arrhythmia 05/11/2017   Diverticulosis 05/11/2017   GERD (gastroesophageal reflux disease) 05/11/2017   Hypothyroidism 05/11/2017   Lower extremity edema 05/11/2017   Morbid (severe) obesity due to excess calories ([place]) 05/11/2017   Polycystic ovary syndrome 05/11/2017   Obstructive sleep apnea 05/11/2017   Neck pain 05/11/2017   Allergic rhinitis 05/11/2017   Ossification of posterior longitudinal ligament ([place]) 08/28/2012   Essential hypertension 05/01/1990    Scot Jun 11/09/2020, 1:28 PM  [place]. [place], [place], [address] Phone: [phone]   Fax:  [phone]  Name: Madison Palmer MRN: [id number] Date of Birth: [birth year]

## 2020-11-11 ENCOUNTER — Encounter: Payer: Self-pay | Admitting: Physical Therapy

## 2020-11-11 ENCOUNTER — Other Ambulatory Visit: Payer: Self-pay

## 2020-11-11 ENCOUNTER — Ambulatory Visit: Payer: BC Managed Care – PPO | Admitting: Physical Therapy

## 2020-11-11 DIAGNOSIS — M542 Cervicalgia: Secondary | ICD-10-CM

## 2020-11-11 DIAGNOSIS — R262 Difficulty in walking, not elsewhere classified: Secondary | ICD-10-CM | POA: Diagnosis not present

## 2020-11-11 DIAGNOSIS — R252 Cramp and spasm: Secondary | ICD-10-CM

## 2020-11-11 DIAGNOSIS — M6281 Muscle weakness (generalized): Secondary | ICD-10-CM

## 2020-11-11 NOTE — Therapy (Signed)
[facility]. [place], [place], [address] Phone: [phone]   Fax:  [phone]  Physical Therapy Treatment  Patient Details  Name: Madison Palmer MRN: [id number] Date of Birth: 1965/12/09 Referring Provider (PT): Esmeralda Arthur Date: 11/11/2020   PT End of Session - 11/11/20 1342     PT Stop Time 1350             Past Medical History:  Diagnosis Date   Acute on chronic respiratory failure with hypoxia and hypercapnia ([place]) 09/15/2018   Anemia    Anxiety    Arrhythmia    tachycardia   Arthritis    Chickenpox    Chronic respiratory failure with hypoxia ([place]) 05/10/2018   Formatting of this note might be different from the original. Last Assessment & Plan:  Continue 1 L continuous on exertion and during sleep. On her next visit we will check OSA on CPAP/room air to decide whether she needs to take oxygen along with her on her cruise in May   Depression    Diverticulitis    GERD (gastroesophageal reflux disease)    Glaucoma    Hyperlipidemia    Hyperparathyroidism ([place])    Hypertension    Inflammatory polyps of colon ([place])    LVH (left ventricular hypertrophy)    Lymphedema    PCOS (polycystic ovarian syndrome)    Prediabetes    Recurrent UTI    Sleep apnea    CPAP   Thyroid disease    Vitamin D deficiency     Past Surgical History:  Procedure Laterality Date   BREAST BIOPSY  2015   CESAREAN SECTION  2004   INNER EAR SURGERY     ear and sinus surgery   LAPAROSCOPIC REPAIR AND REMOVAL OF GASTRIC BAND     OOPHORECTOMY Left    TONSILLECTOMY AND ADENOIDECTOMY      There were no vitals filed for this visit.   Subjective Assessment - 11/11/20 1314     Subjective Hurting but not as bad as Tuesday, Goes to MD next week and is concerned because of lack of progress.    Currently in Pain? Yes    Pain Score 6     Pain Location Shoulder    Pain Orientation Left   to midline                               [facility] Adult PT Treatment/Exercise - 11/11/20 0001       Manual Therapy   Manual Therapy Soft tissue mobilization;Passive ROM    Manual therapy comments tissus density in upper trap    Soft tissue mobilization upper traps    Passive ROM Cervical spine for levoato stretching                      PT Short Term Goals - 08/30/20 1419       PT SHORT TERM GOAL #1   Title independent with initial HEP    Status Partially Met               PT Long Term Goals - 11/09/20 1326       PT LONG TERM GOAL #1   Title understand proper body mechanics for ADL's    Status Partially Met      PT LONG TERM GOAL #2   Title increase lumbar ROM 25%  Status Achieved      PT LONG TERM GOAL #3   Title decrease pain 50%    Status On-going      PT LONG TERM GOAL #4   Title report able to do her normal housework without increase pain    Status Partially Met                   Plan - 11/11/20 1338     Clinical Impression Statement Pt ~ 10 minutes late reporting pain in her upper L shoulder. Pt voiced concerned about her lack of progress considering she was getting better.  She was able to warm up on UBE with a mild increase in symptoms. Positive responds to STM, some dense muscular tissue noted in the upper traps. Modalities for pain.    Personal Factors and Comorbidities Comorbidity 2    Comorbidities obesity, lymphedema    Examination-Activity Limitations Locomotion Level;Stand;Stairs    Examination-Participation Restrictions Community Activity;Interpersonal Relationship    Stability/Clinical Decision Making Stable/Uncomplicated    Rehab Potential Fair    PT Frequency 2x / week    PT Duration 8 weeks    PT Treatment/Interventions ADLs/Self Care Home Management;Cryotherapy;Electrical Stimulation;Moist Heat;Functional mobility training;Therapeutic activities;Therapeutic exercise;Balance training;Neuromuscular re-education;Manual  techniques;Patient/family education;Dry needling;Gait training;Iontophoresis 27m/ml Dexamethasone    PT Next Visit Plan try to progress to more activity. Multi joint interventions, functional strengthening/endurance ex's, manual/modalities as indicated             Patient will benefit from skilled therapeutic intervention in order to improve the following deficits and impairments:  Abnormal gait, Decreased range of motion, Difficulty walking, Increased muscle spasms, Pain, Impaired flexibility, Improper body mechanics, Decreased strength, Decreased mobility, Obesity, Cardiopulmonary status limiting activity, Decreased endurance  Visit Diagnosis: Cramp and spasm  Neck pain  Muscle weakness (generalized)     Problem List Patient Active Problem List   Diagnosis Date Noted   Schizoaffective disorder, bipolar type ([facility] 10/07/2020   DM (diabetes mellitus) ([facility] 10/07/2020   Somatic dysfunction of spine, cervical 10/06/2020   Polyphagia 09/07/2020   Restrictive lung disease 08/11/2020   Leg swelling 01/14/2019   Elevated coronary artery calcium score 01/14/2019   Chronic respiratory failure ([facility] 09/15/2018   Disease of thyroid gland 09/15/2018   Bipolar 1 disorder ([facility] 09/12/2018   Prediabetes 09/05/2018   Class 3 severe obesity with serious comorbidity and body mass index (BMI) of 50.0 to 59.9 in adult ([facility] 09/05/2018   Bipolar affective disorder, manic, severe, with psychotic behavior ([facility] 08/28/2018   Steroid-induced psychosis, 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[facility] 08/23/2018   Brief psychotic disorder ([facility] 08/22/2018   Bipolar I disorder, current or most recent episode manic, with psychotic features ([facility]    Adenomatous polyp 08/18/2018   Educated about COVID-19 virus infection 08/15/2018   Pulmonary vascular congestion 06/24/2018   Chronic diastolic heart failure ([facility] 05/15/2018   Dyspnea 05/15/2018   Pulmonary nodules 05/11/2018   Hypertensive urgency 05/11/2018    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[facility] 04/26/2018   DDD (degenerative disc disease), lumbar 03/21/2018   Primary osteoarthritis of both knees 03/21/2018   Dysphagia 03/19/2018   PVC (premature ventricular contraction) 03/12/2018   LVH (left ventricular hypertrophy) 03/12/2018   Generalized edema 03/12/2018   Bilateral carotid artery stenosis 03/12/2018   Weight gain 01/25/2018   Hemorrhoids 01/05/2018   Generalized postprandial abdominal pain 01/05/2018   Hiatal hernia 01/05/2018   Esophageal dysmotilities 01/05/2018   Mixed hyperlipidemia 09/12/2017   Multinodular goiter 07/27/2017   Hyperparathyroidism, primary ([facility]  06/13/2017   Vitamin D deficiency 06/13/2017   History of colonic polyps 05/15/2017   Eustachian tube dysfunction, right 05/15/2017   Lymphatic edema 05/15/2017   Anemia 05/11/2017   Arrhythmia 05/11/2017   Diverticulosis 05/11/2017   GERD (gastroesophageal reflux disease) 05/11/2017   Hypothyroidism 05/11/2017   Lower extremity edema 05/11/2017   Morbid (severe) obesity due to excess calories ([place]) 05/11/2017   Polycystic ovary syndrome 05/11/2017   Obstructive sleep apnea 05/11/2017   Neck pain 05/11/2017   Allergic rhinitis 05/11/2017   Ossification of posterior longitudinal ligament ([place]) 08/28/2012   Essential hypertension 05/01/1990    Scot Jun, PTA 11/11/2020, 1:43 PM  [facility]. [place], [place], [address] Phone: [phone]   Fax:  [phone]  Name: Madison Palmer MRN: [id number] Date of Birth: March 15, 1966

## 2020-11-15 ENCOUNTER — Other Ambulatory Visit: Payer: Self-pay

## 2020-11-15 ENCOUNTER — Encounter (INDEPENDENT_AMBULATORY_CARE_PROVIDER_SITE_OTHER): Payer: Self-pay | Admitting: Family Medicine

## 2020-11-15 ENCOUNTER — Ambulatory Visit (INDEPENDENT_AMBULATORY_CARE_PROVIDER_SITE_OTHER): Payer: BC Managed Care – PPO | Admitting: Family Medicine

## 2020-11-15 VITALS — BP 152/88 | HR 41 | Temp 97.8°F | Ht 63.0 in | Wt 324.0 lb

## 2020-11-15 DIAGNOSIS — Z9189 Other specified personal risk factors, not elsewhere classified: Secondary | ICD-10-CM

## 2020-11-15 DIAGNOSIS — R0602 Shortness of breath: Secondary | ICD-10-CM

## 2020-11-15 DIAGNOSIS — Z6841 Body Mass Index (BMI) 40.0 and over, adult: Secondary | ICD-10-CM

## 2020-11-15 DIAGNOSIS — R7303 Prediabetes: Secondary | ICD-10-CM

## 2020-11-15 DIAGNOSIS — I10 Essential (primary) hypertension: Secondary | ICD-10-CM

## 2020-11-16 ENCOUNTER — Ambulatory Visit: Payer: BC Managed Care – PPO | Admitting: Physical Therapy

## 2020-11-16 ENCOUNTER — Encounter: Payer: Self-pay | Admitting: Physical Therapy

## 2020-11-16 DIAGNOSIS — M545 Low back pain, unspecified: Secondary | ICD-10-CM

## 2020-11-16 DIAGNOSIS — R252 Cramp and spasm: Secondary | ICD-10-CM

## 2020-11-16 DIAGNOSIS — R262 Difficulty in walking, not elsewhere classified: Secondary | ICD-10-CM | POA: Diagnosis not present

## 2020-11-16 DIAGNOSIS — M542 Cervicalgia: Secondary | ICD-10-CM

## 2020-11-16 DIAGNOSIS — G8929 Other chronic pain: Secondary | ICD-10-CM

## 2020-11-16 DIAGNOSIS — M6281 Muscle weakness (generalized): Secondary | ICD-10-CM

## 2020-11-16 NOTE — Progress Notes (Signed)
[facility] [address] [place] Phone: [phone] Subjective:   I Kandace Blitz am serving as a Education administrator for Dr. Hulan Saas.  This visit occurred during the SARS-CoV-2 public health emergency.  Safety protocols were in place, including screening questions prior to the visit, additional usage of staff PPE, and extensive cleaning of exam room while observing appropriate contact time as indicated for disinfecting solutions.   I'm seeing this patient by the request  of:  [name], Charlene Brooke, NP  CC: Neck pain exacerbation  [id number]  Madison Palmer is a 55 y.o. female coming in with complaint of back and neck pain. OMT 10/06/2020. Patient states she is still in a lot of pain. Past 2 weeks in PT she hasn't really been able to do the exercises due to flares. Taking tylenol 2-3 times a day.  Radicular symptoms is happening down the right arm.  Sometimes feels like she is having some weakness.  Medications patient has been prescribed: None          Past Medical History:  Diagnosis Date   Acute on chronic respiratory failure with hypoxia and hypercapnia ([facility]) 09/15/2018   Anemia    Anxiety    Arrhythmia    tachycardia   Arthritis    Chickenpox    Chronic respiratory failure with hypoxia ([place]) 05/10/2018   Formatting of this note might be different from the original. Last Assessment & Plan:  Continue 1 L continuous on exertion and during sleep. On her next visit we will check OSA on CPAP/room air to decide whether she needs to take oxygen along with her on her cruise in May   Depression    Diverticulitis    GERD (gastroesophageal reflux disease)    Glaucoma    Hyperlipidemia    Hyperparathyroidism ([place])    Hypertension    Inflammatory polyps of colon ([place])    LVH (left ventricular hypertrophy)    Lymphedema    PCOS (polycystic ovarian syndrome)    Prediabetes    Recurrent UTI    Sleep apnea    CPAP   Thyroid disease     Vitamin D deficiency     Allergies  Allergen Reactions   Other     Other reaction(s): Other (See Comments) Instructed not to take by Cardiology.   Fentanyl Hives   Levofloxacin Hives   Midazolam Hives   Pollen Extract     seasonal   Atorvastatin     Muscle pain in legs    Doxycycline Itching and Swelling   Hydralazine Hcl Other (See Comments)    Hypercalcemia    Rosuvastatin     Abdominal pain     Review of Systems:  No headache, visual changes, nausea, vomiting, diarrhea, constipation, dizziness, abdominal pain, skin rash, fevers, chills, night sweats, weight loss, swollen lymph nodes, joint swelling, chest pain, shortness of breath, mood changes. POSITIVE muscle aches, body aches  Objective  Blood pressure (!) 150/88, pulse 72, height 5\' 3"  (1.6 m), weight (!) 327 lb (148.3 kg), last menstrual period 05/02/2017, SpO2 95 %.   General: No apparent distress alert and oriented x3 mood and affect normal, dressed appropriately.  Morbidly obese HEENT: Pupils equal, extraocular movements intact  Respiratory: Patient's speak in full sentences and does not appear short of breath  Cardiovascular: No lower extremity edema, non tender, no erythema   Neck exam shows the patient does have some loss of lordosis.  Severely positive Spurling's noted on the  right side with C7 distribution.  Mild weakness noted in the C7 distribution as well.  Patient's tricep Even has 1+ DTRs compared to the contralateral side.  Grip strength is noted to be symmetric at the moment.  Weakness of the middle finger is noted.      Assessment and Plan:        The above documentation has been reviewed and is accurate and complete Lyndal Pulley, DO        Note: This dictation was prepared with Dragon dictation along with smaller phrase technology. Any transcriptional errors that result from this process are unintentional.

## 2020-11-16 NOTE — Therapy (Signed)
[facility]. [address], [place], [address] Phone: [phone]   Fax:  [phone]  Physical Therapy Treatment  Patient Details  Name: Madison Palmer MRN: [id number] Date of Birth: 1965-09-22 Referring Provider (PT): Esmeralda Arthur Date: 11/16/2020   PT End of Session - 11/16/20 1328     Visit Number 20    Date for PT Re-Evaluation 12/11/20    PT Start Time 1307    PT Stop Time 1341    PT Time Calculation (min) 34 min    Activity Tolerance Patient limited by pain    Behavior During Therapy [facility] for tasks assessed/performed             Past Medical History:  Diagnosis Date   Acute on chronic respiratory failure with hypoxia and hypercapnia ([place]) 09/15/2018   Anemia    Anxiety    Arrhythmia    tachycardia   Arthritis    Chickenpox    Chronic respiratory failure with hypoxia ([place]) 05/10/2018   Formatting of this note might be different from the original. Last Assessment & Plan:  Continue 1 L continuous on exertion and during sleep. On her next visit we will check OSA on CPAP/room air to decide whether she needs to take oxygen along with her on her cruise in May   Depression    Diverticulitis    GERD (gastroesophageal reflux disease)    Glaucoma    Hyperlipidemia    Hyperparathyroidism ([place])    Hypertension    Inflammatory polyps of colon ([place])    LVH (left ventricular hypertrophy)    Lymphedema    PCOS (polycystic ovarian syndrome)    Prediabetes    Recurrent UTI    Sleep apnea    CPAP   Thyroid disease    Vitamin D deficiency     Past Surgical History:  Procedure Laterality Date   BREAST BIOPSY  2015   CESAREAN SECTION  2004   INNER EAR SURGERY     ear and sinus surgery   LAPAROSCOPIC REPAIR AND REMOVAL OF GASTRIC BAND     OOPHORECTOMY Left    TONSILLECTOMY AND ADENOIDECTOMY      There were no vitals filed for this visit.   Subjective Assessment - 11/16/20 1308     Subjective Pt  reports that the past ~2 weeks the pain has been more intense. Has MD appt tomorrow, 11/16/20.    Currently in Pain? Yes    Pain Score 6     Pain Location Shoulder    Pain Orientation Left                               [facility] Adult PT Treatment/Exercise - 11/16/20 0001       Self-Care   Self-Care Other Self-Care Comments    Other Self-Care Comments  education on importance of cervical stretching and maintenance of pain free ROM      Moist Heat Therapy   Number Minutes Moist Heat 15 Minutes    Moist Heat Location Shoulder;Cervical      Electrical Stimulation   Electrical Stimulation Location L UT    Electrical Stimulation Action IFC    Electrical Stimulation Parameters seated    Electrical Stimulation Goals Pain      Manual Therapy   Manual Therapy Soft tissue mobilization;Passive ROM    Manual therapy comments tissus density in upper trap    Soft  tissue mobilization upper traps    Passive ROM Cervical spine for levoato stretching                      PT Short Term Goals - 08/30/20 1419       PT SHORT TERM GOAL #1   Title independent with initial HEP    Status Partially Met               PT Long Term Goals - 11/09/20 1326       PT LONG TERM GOAL #1   Title understand proper body mechanics for ADL's    Status Partially Met      PT LONG TERM GOAL #2   Title increase lumbar ROM 25%    Status Achieved      PT LONG TERM GOAL #3   Title decrease pain 50%    Status On-going      PT LONG TERM GOAL #4   Title report able to do her normal housework without increase pain    Status Partially Met                   Plan - 11/16/20 1329     Clinical Impression Statement Pt ~7 min late again reporting significant increase in pain in L UT. Discussed next steps for progression of PT; pt f/u with MD tomorrow 7/20 and we will discuss plan for continuation of PT at next rx. Requesting heat, estim, manual, and DN again this rx;  stated relief last session. Responded well to DN with c/o mild increased soreness.    PT Treatment/Interventions ADLs/Self Care Home Management;Cryotherapy;Electrical Stimulation;Moist Heat;Functional mobility training;Therapeutic activities;Therapeutic exercise;Balance training;Neuromuscular re-education;Manual techniques;Patient/family education;Dry needling;Gait training;Iontophoresis 46m/ml Dexamethasone    PT Next Visit Plan try to progress to more activity. Multi joint interventions, functional strengthening/endurance ex's, manual/modalities as indicated    Consulted and Agree with Plan of Care Patient             Patient will benefit from skilled therapeutic intervention in order to improve the following deficits and impairments:  Abnormal gait, Decreased range of motion, Difficulty walking, Increased muscle spasms, Pain, Impaired flexibility, Improper body mechanics, Decreased strength, Decreased mobility, Obesity, Cardiopulmonary status limiting activity, Decreased endurance  Visit Diagnosis: Cramp and spasm  Neck pain  Muscle weakness (generalized)  Difficulty in walking, not elsewhere classified  Chronic bilateral low back pain without sciatica     Problem List Patient Active Problem List   Diagnosis Date Noted   Schizoaffective disorder, bipolar type ([facility] 10/07/2020   DM (diabetes mellitus) ([facility] 10/07/2020   Somatic dysfunction of spine, cervical 10/06/2020   Polyphagia 09/07/2020   Restrictive lung disease 08/11/2020   Leg swelling 01/14/2019   Elevated coronary artery calcium score 01/14/2019   Chronic respiratory failure ([facility] 09/15/2018   Disease of thyroid gland 09/15/2018   Bipolar 1 disorder ([facility] [place] 09/12/2018   Prediabetes 09/05/2018   Class 3 severe obesity with serious comorbidity and body mass index (BMI) of 50.0 to 59.9 in adult ([facility] 09/05/2018   Bipolar affective disorder, manic, severe, with psychotic behavior ([facility] 08/28/2018   Steroid-induced  psychosis, 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[facility] 08/23/2018   Brief psychotic disorder ([facility] 08/22/2018   Bipolar I disorder, current or most recent episode manic, with psychotic features ([facility]    Adenomatous polyp 08/18/2018   Educated about COVID-19 virus infection 08/15/2018   Pulmonary vascular congestion 06/24/2018   Chronic diastolic heart failure ([facility] 05/15/2018   Dyspnea 05/15/2018  Pulmonary nodules 05/11/2018   Hypertensive urgency 05/11/2018   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[place]) 04/26/2018   DDD (degenerative disc disease), lumbar 03/21/2018   Primary osteoarthritis of both knees 03/21/2018   Dysphagia 03/19/2018   PVC (premature ventricular contraction) 03/12/2018   LVH (left ventricular hypertrophy) 03/12/2018   Generalized edema 03/12/2018   Bilateral carotid artery stenosis 03/12/2018   Weight gain 01/25/2018   Hemorrhoids 01/05/2018   Generalized postprandial abdominal pain 01/05/2018   Hiatal hernia 01/05/2018   Esophageal dysmotilities 01/05/2018   Mixed hyperlipidemia 09/12/2017   Multinodular goiter 07/27/2017   Hyperparathyroidism, primary ([place]) 06/13/2017   Vitamin D deficiency 06/13/2017   History of colonic polyps 05/15/2017   Eustachian tube dysfunction, right 05/15/2017   Lymphatic edema 05/15/2017   Anemia 05/11/2017   Arrhythmia 05/11/2017   Diverticulosis 05/11/2017   GERD (gastroesophageal reflux disease) 05/11/2017   Hypothyroidism 05/11/2017   Lower extremity edema 05/11/2017   Morbid (severe) obesity due to excess calories ([place]) 05/11/2017   Polycystic ovary syndrome 05/11/2017   Obstructive sleep apnea 05/11/2017   Neck pain 05/11/2017   Allergic rhinitis 05/11/2017   Ossification of posterior longitudinal ligament ([place]) 08/28/2012   Essential hypertension 05/01/1990   Amador Cunas, PT, DPT Donald Prose Jedadiah Abdallah 11/16/2020, 1:34 PM  [place]. [place], [place],  [address] Phone: [phone]   Fax:  [phone]  Name: Madison Palmer MRN: [id number] Date of Birth: 02/11/1966

## 2020-11-17 ENCOUNTER — Other Ambulatory Visit: Payer: Self-pay

## 2020-11-17 ENCOUNTER — Ambulatory Visit (INDEPENDENT_AMBULATORY_CARE_PROVIDER_SITE_OTHER): Payer: BC Managed Care – PPO | Admitting: Family Medicine

## 2020-11-17 ENCOUNTER — Encounter: Payer: Self-pay | Admitting: Family Medicine

## 2020-11-17 VITALS — BP 150/88 | HR 72 | Ht 63.0 in | Wt 327.0 lb

## 2020-11-17 DIAGNOSIS — M5010 Cervical disc disorder with radiculopathy, unspecified cervical region: Secondary | ICD-10-CM | POA: Diagnosis not present

## 2020-11-17 DIAGNOSIS — M542 Cervicalgia: Secondary | ICD-10-CM | POA: Diagnosis not present

## 2020-11-17 NOTE — Assessment & Plan Note (Signed)
Acute onset of cervical radiculopathy.  Relate radicular symptoms going down the right arm in the C7 distribution.  Mild edema is noted as well.  Patient has been doing conservative therapy including formal physical therapy and now worsening.  Discussed with patient about different treatment options and we have elected to go with advanced imaging.  An MRI is necessary.  Patient could be a candidate for possible epidurals.  Follow-up again after imaging and will discuss further.

## 2020-11-17 NOTE — Patient Instructions (Addendum)
Good to see you MRI neck We will write you once you get MRI done and discuss next options

## 2020-11-18 ENCOUNTER — Ambulatory Visit: Payer: BC Managed Care – PPO | Admitting: Physical Therapy

## 2020-11-22 NOTE — Progress Notes (Signed)
Chief Complaint:   OBESITY Madison Palmer is here to discuss her progress with her obesity treatment plan along with follow-up of her obesity related diagnoses. Madison Palmer is on the Category 3 Plan and keeping a food journal and adhering to recommended goals of 1600 calories and 90 g protein and states she is following her eating plan approximately 20% of the time. [name] states she is not currently exercising.  Today's visit was #: 9 Starting weight: 357 lbs Starting date: 02/27/2018 Today's weight: 324 lbs Today's date: 11/15/2020 Total lbs lost to date: 33 Total lbs lost since last in-office visit: 0  Interim History: Madison Palmer feels she could have had a better few weeks, food wise. She was eating out more and not as in control of food options and choices. She is eating out and realizes in order to get protein in, she is eating more calorie options. She is mainly doing category 3 for breakfast but [place] not getting 8-10 oz meat at dinner.  Subjective:   1. Essential hypertension Madison Palmer's BP is elevated today. She did have caffeine prior to her appt and she feels anxiety due to getting on the scale. She is on amlodipine, clonidine, Normodyne, and Zestril.   2. Pre-diabetes Her last A1c was 5.6 and insulin level 18.9. She is on Metformin and denies GI side effects.  Assessment/Plan:   1. Essential hypertension Madison Palmer is working on healthy weight loss and exercise to improve blood pressure control. We will watch for signs of hypotension as she continues her lifestyle modifications. Continue current treatment plan.  2. Pre-diabetes Madison Palmer will consider GLP-1 at next appt. She will continue to work on weight loss, exercise, and decreasing simple carbohydrates to help decrease the risk of diabetes.   3. Obesity current BMI 57.5  Madison Palmer is currently in the action stage of change. As such, her goal is to continue with weight loss efforts. She has agreed to keeping a food journal and adhering to  recommended goals of 1650-1800 calories and 125+ g protein.   Exercise goals:  Pt is to go to the pool 10-15 minutes 2-3 times a week.  Behavioral modification strategies: increasing lean protein intake, meal planning and cooking strategies, keeping healthy foods in the home, planning for success, and keeping a strict food journal.  Madison Palmer has agreed to follow-up with our clinic in 3 weeks. She was informed of the importance of frequent follow-up visits to maximize her success with intensive lifestyle modifications for her multiple health conditions.   Objective:   Blood pressure (!) 152/88, pulse (!) 41, temperature 97.8 F (36.6 C), height '5\' 3"'$  (1.6 m), weight (!) 324 lb (147 kg), last menstrual period 05/02/2017, SpO2 (!) 64 %. Body mass index is 57.39 kg/m.  General: Cooperative, alert, well developed, in no acute distress. HEENT: Conjunctivae and lids unremarkable. Cardiovascular: Regular rhythm.  Lungs: Normal work of breathing. Neurologic: No focal deficits.   Lab Results  Component Value Date   CREATININE 1.12 (H) 09/01/2020   BUN 24 09/01/2020   NA 142 09/01/2020   K 4.4 09/01/2020   CL 102 09/01/2020   CO2 24 09/01/2020   Lab Results  Component Value Date   ALT 11 06/10/2020   AST 12 06/10/2020   ALKPHOS 90 06/10/2020   BILITOT 0.3 06/10/2020   Lab Results  Component Value Date   HGBA1C 5.6 06/01/2020   HGBA1C 5.7 (H) 12/11/2019   HGBA1C 5.7 (H) 12/24/2018   HGBA1C 6.0 (H) 02/27/2018   HGBA1C  5.7 09/10/2017   Lab Results  Component Value Date   INSULIN 18.9 06/01/2020   INSULIN 11.4 12/11/2019   INSULIN 21.1 12/30/2018   INSULIN 9.6 02/27/2018   Lab Results  Component Value Date   TSH 2.340 09/01/2020   Lab Results  Component Value Date   CHOL 216 (H) 06/10/2020   HDL 50 06/10/2020   LDLCALC 138 (H) 06/10/2020   TRIG 154 (H) 06/10/2020   CHOLHDL 5.6 (H) 12/24/2018   Lab Results  Component Value Date   VD25OH 29.0 (L) 09/01/2020   VD25OH  19.7 (L) 06/01/2020   VD25OH 36.5 12/30/2018   Lab Results  Component Value Date   WBC 5.4 09/25/2018   HGB 12.7 09/25/2018   HCT 40.4 09/25/2018   MCV 94.6 09/25/2018   PLT 229 09/25/2018   Lab Results  Component Value Date   IRON 44 02/20/2018   TIBC 347 05/29/2017    Attestation Statements:   Reviewed by clinician on day of visit: allergies, medications, problem list, medical history, surgical history, family history, social history, and previous encounter notes.  Coral Ceo, CMA, am acting as transcriptionist for Coralie Common, MD.   I have reviewed the above documentation for accuracy and completeness, and I agree with the above. - Coralie Common, MD

## 2020-11-23 ENCOUNTER — Other Ambulatory Visit: Payer: BC Managed Care – PPO

## 2020-11-25 ENCOUNTER — Other Ambulatory Visit: Payer: Self-pay

## 2020-11-25 ENCOUNTER — Ambulatory Visit
Admission: RE | Admit: 2020-11-25 | Discharge: 2020-11-25 | Disposition: A | Discharge status: Home / Self Care | Payer: BC Managed Care – PPO | Source: Ambulatory Visit | Attending: Family Medicine | Admitting: Family Medicine

## 2020-11-25 DIAGNOSIS — M542 Cervicalgia: Secondary | ICD-10-CM

## 2020-11-29 ENCOUNTER — Encounter: Payer: Self-pay | Admitting: Family Medicine

## 2020-11-30 ENCOUNTER — Other Ambulatory Visit: Payer: Self-pay

## 2020-11-30 DIAGNOSIS — M5412 Radiculopathy, cervical region: Secondary | ICD-10-CM

## 2020-11-30 IMAGING — PT NM PET TUM IMG INITIAL (PI) SKULL BASE T - THIGH
1 of 8 series · 1 of 25 positions shown · non-contrast
Comparison: Chest CT 05/10/2018.

CLINICAL DATA: Initial treatment strategy for multiple pulmonary
nodules on CT. No history of primary malignancy submitted. Never
smoker..

EXAM:
NUCLEAR MEDICINE PET SKULL BASE TO THIGH
TECHNIQUE: 14.5 mCi F-18 FDG was injected intravenously. Full-ring PET imaging
was performed from the skull base to thigh after the radiotracer. CT
data was obtained and used for attenuation correction and anatomic
localization.
Fasting blood glucose: 95 mg/dl

[Series 4: ct sk_thigh 5.0 b31f · axial · 5.0mm · 0.98mm/px · 1 of 236 slices shown]
[im 236/236  brain]
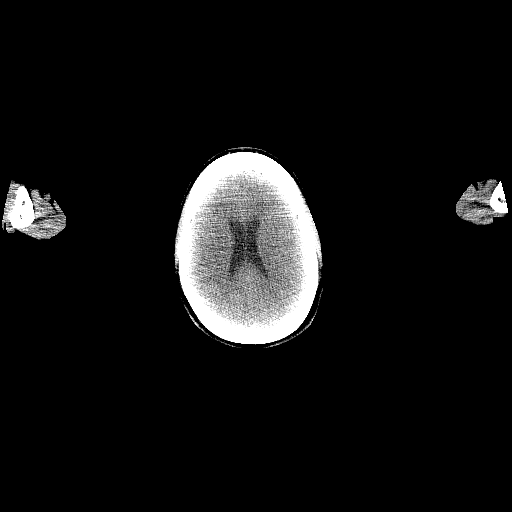

[1 of 25 positions shown; findings below may reference images not displayed]

FINDINGS: Mild to moderate degradation secondary to patient body habitus.

Mediastinal blood pool activity: SUV max

NECK: No areas of abnormal hypermetabolism.

Incidental CT findings: No gross cervical adenopathy.

CHEST: No thoracic nodal hypermetabolism. Hypermetabolism
corresponding to some of the larger bilateral pulmonary nodules. For
example, a left lower lobe pulmonary nodule measures 1.1 cm and a
S.U.V. max of 3.2 on image 42/8.

A central lingular nodule measures 1.2 cm and a S.U.V. max of 4.2 on
image 32/8. These nodules are grossly similar to 05/10/2018, but not
well evaluated secondary to motion and nondedicated technique.

Incidental CT findings: Aortic and dense coronary artery
calcification.

ABDOMEN/PELVIS: No abdominopelvic parenchymal or nodal
hypermetabolism.

Incidental CT findings: Left adrenal thickening and nodularity.
Normal right adrenal gland. Abdominal aortic atherosclerosis.
Probable hepatomegaly. Scattered colonic diverticula. Colonic stool
burden suggests constipation.

SKELETON: No abnormal marrow activity.

Incidental CT findings: Advanced degenerative changes within the
bilateral sacroiliac joints and throughout the lumbar spine.
IMPRESSION: 1. Patient body habitus degradation.
2. Hypermetabolism corresponding to bilateral pulmonary nodules. No
evidence of hypermetabolic primary. Differential considerations
remain benign infectious/inflammatory nodules, metastatic disease
from an otherwise occult primary, and less likely 1 or more primary
lung neoplasms. Potential clinical strategies include three-month
follow-up CT versus tissue sampling.
3. Age advanced coronary artery atherosclerosis. Recommend
assessment of coronary risk factors and consideration of medical
therapy. Aortic Atherosclerosis ([id number]-B1L.L).

## 2020-12-02 ENCOUNTER — Encounter (INDEPENDENT_AMBULATORY_CARE_PROVIDER_SITE_OTHER): Payer: Self-pay

## 2020-12-02 ENCOUNTER — Other Ambulatory Visit (INDEPENDENT_AMBULATORY_CARE_PROVIDER_SITE_OTHER): Payer: Self-pay | Admitting: Adult Health

## 2020-12-02 DIAGNOSIS — R7303 Prediabetes: Secondary | ICD-10-CM

## 2020-12-02 NOTE — Telephone Encounter (Signed)
Message sent to pt-CAS 

## 2020-12-02 NOTE — Telephone Encounter (Signed)
Dr.Ukleja 

## 2020-12-04 NOTE — Progress Notes (Signed)
Cardiology Office Note   Date:  12/06/2020   ID:  Madison Palmer, DOB [birth year], MRN [id number]  PCP:  Madison Buffy, NP  Cardiologist:   Minus Breeding, MD   Chief Complaint  Patient presents with   Diastolic HF       History of Present Illness: Madison Palmer is a 55 y.o. female who presents for follow up of dyspnea.   She had an echo in Nov with a normal EF of 60 - 65% with severe concentric LVH.   She also is noted to have a past history of difficult to control hypertension.  She moved here from [place].  She has had LVH with preserved ejection fraction.  She had some mild internal carotid artery plaque noted on Doppler in the past.  She had a perfusion study in 2018 that demonstrated no evidence of ischemia or infarct.  She is been managed for her hypertension.       Since I last saw her she has done well.  She has had no new hospitalizations.  She denies any chest pressure, neck or arm discomfort.  She has had no new shortness of breath, PND or orthopnea.  She denies any palpitations presyncope or syncope.  Has had no weight gain or edema.   Past Medical History:  Diagnosis Date   Acute on chronic respiratory failure with hypoxia and hypercapnia (HCC) 09/15/2018   Anemia    Anxiety    Arrhythmia    tachycardia   Arthritis    Chickenpox    Chronic respiratory failure with hypoxia ([place]) 05/10/2018   Formatting of this note might be different from the original. Last Assessment & Plan:  Continue 1 L continuous on exertion and during sleep. On her next visit we will check OSA on CPAP/room air to decide whether she needs to take oxygen along with her on her cruise in May   Depression    Diverticulitis    GERD (gastroesophageal reflux disease)    Glaucoma    Hyperlipidemia    Hyperparathyroidism ([place])    Hypertension    Inflammatory polyps of colon ([place])    LVH (left ventricular hypertrophy)    Lymphedema    PCOS (polycystic ovarian syndrome)    Prediabetes     Recurrent UTI    Sleep apnea    CPAP   Thyroid disease    Vitamin D deficiency     Past Surgical History:  Procedure Laterality Date   BREAST BIOPSY  2015   CESAREAN SECTION  2004   INNER EAR SURGERY     ear and sinus surgery   LAPAROSCOPIC REPAIR AND REMOVAL OF GASTRIC BAND     OOPHORECTOMY Left    TONSILLECTOMY AND ADENOIDECTOMY       Current Outpatient Medications  Medication Sig Dispense Refill   acetaminophen (TYLENOL) 500 MG tablet Take 1,000 mg by mouth 3 (three) times daily as needed for moderate pain or headache.     amLODipine (NORVASC) 5 MG tablet Take 1 tablet (5 mg total) by mouth daily. 90 tablet 1   cetirizine (ZYRTEC ALLERGY) 10 MG tablet Take 1 tablet (10 mg total) by mouth daily. 90 tablet 1   Cholecalciferol (VITAMIN D3 MAXIMUM STRENGTH) 125 MCG (5000 UT) capsule Take 5,000 Units by mouth daily.     cloNIDine (CATAPRES) 0.1 MG tablet 2 (two) times daily.      divalproex (DEPAKOTE) 250 MG DR tablet Take 1 tablet (250 mg total) by mouth at  bedtime. 30 tablet 2   esomeprazole (NEXIUM) 40 MG capsule TAKE ONE CAPSULE(40 MG TOTAL) BY MOUTH TWICE DAILY 180 capsule 2   fenofibrate (TRICOR) 145 MG tablet Take 1 tablet (145 mg total) by mouth daily. 90 tablet 0   furosemide (LASIX) 40 MG tablet TAKE 1 TABLET(40 MG) BY MOUTH DAILY 90 tablet 3   gabapentin (NEURONTIN) 100 MG capsule Take 2 capsules (200 mg total) by mouth at bedtime. 180 capsule 3   labetalol (NORMODYNE) 300 MG tablet TAKE 2 TABLETS(600 MG) BY MOUTH TWICE DAILY (Patient taking differently: Takes 1 tablet twice a day.) 360 tablet 1   levothyroxine (SYNTHROID) 75 MCG tablet Take 1 tablet (75 mcg total) by mouth daily. 90 tablet 0   lisinopril (ZESTRIL) 40 MG tablet Take 1 tablet (40 mg total) by mouth daily. 90 tablet 3   Lurasidone HCl 60 MG TABS Take 1 tablet (60 mg total) by mouth in the morning. 30 tablet 2   metFORMIN (GLUCOPHAGE) 500 MG tablet Take 1 tablet (500 mg total) by mouth daily with breakfast.  90 tablet 0   Potassium Chloride ER 20 MEQ TBCR TAKE 1 TABLET BY MOUTH EVERY DAY 90 tablet 0   TART CHERRY PO Take by mouth daily.     fluticasone (FLONASE) 50 MCG/ACT nasal spray Place 2 sprays into both nostrils daily. (Patient not taking: Reported on 12/06/2020)     No current facility-administered medications for this visit.    Allergies:   Other, Fentanyl, Levofloxacin, Midazolam, Pollen extract, Atorvastatin, Doxycycline, Hydralazine hcl, and Rosuvastatin    ROS:  Please see the history of present illness.   Otherwise, review of systems are positive for none.    All other systems are reviewed and negative.    PHYSICAL EXAM: VS:  BP (!) 142/90 (BP Location: Left Arm, Patient Position: Sitting, Cuff Size: Large)   Pulse 74   Ht '5\' 4"'$  (1.626 m)   Wt (!) 329 lb (149.2 kg)   LMP 05/02/2017   SpO2 94%   BMI 56.47 kg/m  , BMI Body mass index is 56.47 kg/m. GENERAL:  Well appearing NECK:  No jugular venous distention, waveform within normal limits, carotid upstroke brisk and symmetric, no bruits, no thyromegaly LUNGS:  Clear to auscultation bilaterally CHEST:  Unremarkable HEART:  PMI not displaced or sustained,S1 and S2 within normal limits, no S3, no S4, no clicks, no rubs, no murmurs ABD:  Flat, positive bowel sounds normal in frequency in pitch, no bruits, no rebound, no guarding, no midline pulsatile mass, no hepatomegaly, no splenomegaly EXT:  2 plus pulses throughout, no edema, no cyanosis no clubbing    EKG:  EKG is ordered today. Sinus rhythm, rate 75, left axis deviation, poor anterior R wave progression, no acute ST changes.  Recent Labs: 06/10/2020: ALT 11 09/01/2020: BUN 24; Creatinine, Ser 1.12; Potassium 4.4; Sodium 142; TSH 2.340      Wt Readings from Last 3 Encounters:  12/06/20 (!) 329 lb (149.2 kg)  11/17/20 (!) 327 lb (148.3 kg)  11/15/20 (!) 324 lb (147 kg)      Other studies Reviewed: Additional studies/ records that were reviewed today include:  Labs Review of the above records demonstrates:  Please see elsewhere in the note.     ASSESSMENT AND PLAN:  HTN: Blood pressure is mildly elevated today but she says this is unusual.  She is going to keep a blood pressure diary.  No change in therapy.  PULMONARY NODULES:   I did review  her recent CT.  She has stable multiple nodules and is followed by Dr. Elsworth Soho  LVH:  PYP scan was negative.  I think this is related to her hypertension and management is primarily controlling her hypertension.  EDEMA:    This is much improved.  No change in therapy.   CHRONIC DIASTOLIC HF:   She seems to be euvolemic.  She had normal renal function and potassium in May.  No change in therapy.   OBESITY:   She has been working on this  CORONARY CALCIUM:    She has had no chest pain.  We are continue with primary risk reduction.   SLEEP APNEA:   She wears CPAP and tolerates this well.  No change in therapy.   CAROTID STENOSIS :   She had very mild plaque two years ago.  No further imaging at this point.   DYSLIPIDEMIA:    LDL was 138 earlier this year but she does not tolerate any statin.  She will continue with her Tricor.   Current medicines are reviewed at length with the patient today.  The patient does not have concerns regarding medicines.  The following changes have been made:  None  Labs/ tests ordered today include: None  Orders Placed This Encounter  Procedures   EKG 12-Lead      Disposition:   FU with me in 12 months.    Signed, Minus Breeding, MD  12/06/2020 4:58 PM    [facility]

## 2020-12-06 ENCOUNTER — Other Ambulatory Visit: Payer: Self-pay

## 2020-12-06 ENCOUNTER — Encounter: Payer: Self-pay | Admitting: Cardiology

## 2020-12-06 ENCOUNTER — Ambulatory Visit
Admission: RE | Admit: 2020-12-06 | Discharge: 2020-12-06 | Disposition: A | Discharge status: Home / Self Care | Payer: BC Managed Care – PPO | Source: Ambulatory Visit | Attending: Family Medicine | Admitting: Family Medicine

## 2020-12-06 ENCOUNTER — Ambulatory Visit (INDEPENDENT_AMBULATORY_CARE_PROVIDER_SITE_OTHER): Payer: BC Managed Care – PPO | Admitting: Cardiology

## 2020-12-06 VITALS — BP 142/90 | HR 74 | Ht 64.0 in | Wt 329.0 lb

## 2020-12-06 DIAGNOSIS — I5032 Chronic diastolic (congestive) heart failure: Secondary | ICD-10-CM | POA: Diagnosis not present

## 2020-12-06 DIAGNOSIS — I517 Cardiomegaly: Secondary | ICD-10-CM

## 2020-12-06 DIAGNOSIS — M5412 Radiculopathy, cervical region: Secondary | ICD-10-CM

## 2020-12-06 MED ORDER — IOPAMIDOL (ISOVUE-M 300) INJECTION 61%
1.0000 mL | Freq: Once | INTRAMUSCULAR | Status: AC
  Administered 2020-12-06: 1 mL via EPIDURAL

## 2020-12-06 MED ORDER — TRIAMCINOLONE ACETONIDE 40 MG/ML IJ SUSP (RADIOLOGY)
60.0000 mg | Freq: Once | INTRAMUSCULAR | Status: AC
  Administered 2020-12-06: 60 mg via EPIDURAL

## 2020-12-06 NOTE — Patient Instructions (Signed)
Medication Instructions:  No changes *If you need a refill on your cardiac medications before your next appointment, please call your pharmacy*   Lab Work: None ordered If you have labs (blood work) drawn today and your tests are completely normal, you will receive your results only by: MyChart Message (if you have MyChart) OR A paper copy in the mail If you have any lab test that is abnormal or we need to change your treatment, we will call you to review the results.   Testing/Procedures: None ordered   Follow-Up: At [facility], you and your health needs are our priority.  As part of our continuing mission to provide you with exceptional heart care, we have created designated Provider Care Teams.  These Care Teams include your primary Cardiologist (physician) and Advanced Practice Providers (APPs -  Physician Assistants and Nurse Practitioners) who all work together to provide you with the care you need, when you need it.  We recommend signing up for the patient portal called "MyChart".  Sign up information is provided on this After Visit Summary.  MyChart is used to connect with patients for Virtual Visits (Telemedicine).  Patients are able to view lab/test results, encounter notes, upcoming appointments, etc.  Non-urgent messages can be sent to your provider as well.   To learn more about what you can do with MyChart, go to [email or web address].    Your next appointment:   12 month(s)  The format for your next appointment:   In Person  Provider:   You may see James Hochrein, MD or one of the following Advanced Practice Providers on your designated Care Team:   Rhonda Barrett, PA-C Jennifer, Lambert, PA-C Kathryn Lawrence, DNP, ANP   

## 2020-12-06 NOTE — Discharge Instructions (Signed)

## 2020-12-07 ENCOUNTER — Ambulatory Visit (INDEPENDENT_AMBULATORY_CARE_PROVIDER_SITE_OTHER): Payer: BC Managed Care – PPO | Admitting: Family Medicine

## 2020-12-07 ENCOUNTER — Encounter (INDEPENDENT_AMBULATORY_CARE_PROVIDER_SITE_OTHER): Payer: Self-pay | Admitting: Family Medicine

## 2020-12-07 VITALS — BP 146/92 | HR 88 | Temp 98.2°F | Ht 63.0 in | Wt 325.0 lb

## 2020-12-07 DIAGNOSIS — R7303 Prediabetes: Secondary | ICD-10-CM | POA: Diagnosis not present

## 2020-12-07 DIAGNOSIS — Z6841 Body Mass Index (BMI) 40.0 and over, adult: Secondary | ICD-10-CM

## 2020-12-07 DIAGNOSIS — Z9189 Other specified personal risk factors, not elsewhere classified: Secondary | ICD-10-CM | POA: Diagnosis not present

## 2020-12-07 DIAGNOSIS — E038 Other specified hypothyroidism: Secondary | ICD-10-CM | POA: Diagnosis not present

## 2020-12-07 MED ORDER — TIRZEPATIDE 2.5 MG/0.5ML ~~LOC~~ SOAJ: 2.5000 mg | SUBCUTANEOUS | 0 refills | Status: DC

## 2020-12-07 MED ORDER — LEVOTHYROXINE SODIUM 75 MCG PO TABS: 75.0000 ug | ORAL_TABLET | Freq: Every day | ORAL | 0 refills | Status: DC

## 2020-12-07 MED ORDER — METFORMIN HCL 500 MG PO TABS: 500.0000 mg | ORAL_TABLET | Freq: Every day | ORAL | 0 refills | Status: DC

## 2020-12-08 ENCOUNTER — Ambulatory Visit (INDEPENDENT_AMBULATORY_CARE_PROVIDER_SITE_OTHER): Payer: BC Managed Care – PPO

## 2020-12-08 ENCOUNTER — Encounter (INDEPENDENT_AMBULATORY_CARE_PROVIDER_SITE_OTHER): Payer: Self-pay | Admitting: Family Medicine

## 2020-12-08 ENCOUNTER — Other Ambulatory Visit: Payer: Self-pay

## 2020-12-08 DIAGNOSIS — Z23 Encounter for immunization: Secondary | ICD-10-CM

## 2020-12-08 NOTE — Progress Notes (Signed)
After obtaining consent, and per orders of [facility], injection of Shingle#2 given by Lynda Rainwater. Patient instructed to remain in clinic for 20 minutes afterwards, and to report any adverse reaction to me immediately.

## 2020-12-08 NOTE — Progress Notes (Signed)
Chief Complaint:   OBESITY Madison Palmer Palmer here to discuss her progress with her obesity treatment plan along with follow-up of her obesity related diagnoses. Madison Palmer Palmer on keeping a food journal and adhering to recommended goals of 1650-1800 calories and 125+ grams protein and states she Palmer following her eating plan approximately 50% of the time. Madison Palmer states she Palmer not currently exercising.  Today's visit Palmer #: 48 Starting weight: 357 lbs Starting date: 02/27/2018 Today's weight: 325 lbs Today's date: 12/07/2020 Total lbs lost to date: 32 Total lbs lost since last in-office visit: 0  Interim History: Madison Palmer had an unexpected trip to [place] last week as her son wanted to see his grandmother. She ate out more than she wanted to and Palmer waiting to be able to grill out. Her son has been a big support and has helped get back to cooking at home. She found it harder to get 125 grams protein a day as she ran out of calories before getting all her protein. Only about 3 days, she didn't get protein.  Subjective:   1. Pre-diabetes Madison Palmer's last A1c Palmer 5.6 and insulin level 18.9. She Palmer on Metformin with no GI side effects.  2. Other specified hypothyroidism Her last TSH Palmer 2.34. She Palmer on levothyroxine 75 mcg.  3. At risk for side effect of medication Madison Palmer on side effects of medication due to starting Madison Palmer.  Assessment/Plan:   1. Pre-diabetes Madison Palmer will start Mounjaro 2.5 mg and continue to work on weight loss, exercise, and decreasing simple carbohydrates to help decrease the risk of diabetes.   Refill- metFORMIN (GLUCOPHAGE) 500 MG tablet; Take 1 tablet (500 mg total) by mouth daily with breakfast.  Dispense: 90 tablet; Refill: 0  Start- tirzepatide Madison Palmer) 2.5 MG/0.5ML Pen; Inject 2.5 mg into the skin once a week. Patient has copay coupon  Dispense: 2 mL; Refill: 0  2. Other specified hypothyroidism Patient with long-standing hypothyroidism, on levothyroxine therapy.  She appears euthyroid. Orders and follow up as documented in patient record.  Counseling Good thyroid control Palmer important for overall health. Supratherapeutic thyroid levels are dangerous and will not improve weight loss results. The correct way to take levothyroxine Palmer fasting, with water, separated by at least 30 minutes from breakfast, and separated by more than 4 hours from calcium, iron, multivitamins, acid reflux medications (PPIs).   Refill- levothyroxine (SYNTHROID) 75 MCG tablet; Take 1 tablet (75 mcg total) by mouth daily.  Dispense: 90 tablet; Refill: 0  3. At risk for side effect of medication Madison Palmer given approximately 15 minutes of drug side effect counseling today.  We discussed side effect possibility and risk versus benefits. Madison Palmer agreed to the medication and will contact this office if these side effects are intolerable.  Repetitive spaced learning Palmer employed today to elicit superior memory formation and behavioral change.   4. Obesity with current BMI of 57.7  Madison Palmer Palmer currently in the action stage of change. As such, her goal Palmer to continue with weight loss efforts. She has agreed to keeping a food journal and adhering to recommended goals of 1650-1800 calories and 125+ grams protein.   Exercise goals:  Go to the [employer] Palmer for aqua exercise.  Behavioral modification strategies: increasing lean protein intake, meal planning and cooking strategies, keeping healthy foods in the home, and keeping a strict food journal.  Madison Palmer has agreed to follow-up with our clinic in 3 weeks. She Palmer informed of the importance of frequent follow-up  visits to maximize her success with intensive lifestyle modifications for her multiple health conditions.   Objective:   Blood pressure (!) 146/92, pulse 88, temperature 98.2 F (36.8 C), height '5\' 3"'$  (1.6 m), weight (!) 325 lb (147.4 kg), last menstrual period 05/02/2017, SpO2 96 %. Body mass index Palmer 57.57 kg/m.  General:  Cooperative, alert, well developed, in no acute distress. HEENT: Conjunctivae and lids unremarkable. Cardiovascular: Regular rhythm.  Lungs: Normal work of breathing. Neurologic: No focal deficits.   Lab Results  Component Value Date   CREATININE 1.12 (H) 09/01/2020   BUN 24 09/01/2020   NA 142 09/01/2020   K 4.4 09/01/2020   CL 102 09/01/2020   CO2 24 09/01/2020   Lab Results  Component Value Date   ALT 11 06/10/2020   AST 12 06/10/2020   ALKPHOS 90 06/10/2020   BILITOT 0.3 06/10/2020   Lab Results  Component Value Date   HGBA1C 5.6 06/01/2020   HGBA1C 5.7 (H) 12/11/2019   HGBA1C 5.7 (H) 12/24/2018   HGBA1C 6.0 (H) 02/27/2018   HGBA1C 5.7 09/10/2017   Lab Results  Component Value Date   INSULIN 18.9 06/01/2020   INSULIN 11.4 12/11/2019   INSULIN 21.1 12/30/2018   INSULIN 9.6 02/27/2018   Lab Results  Component Value Date   TSH 2.340 09/01/2020   Lab Results  Component Value Date   CHOL 216 (H) 06/10/2020   HDL 50 06/10/2020   LDLCALC 138 (H) 06/10/2020   TRIG 154 (H) 06/10/2020   CHOLHDL 5.6 (H) 12/24/2018   Lab Results  Component Value Date   VD25OH 29.0 (L) 09/01/2020   VD25OH 19.7 (L) 06/01/2020   VD25OH 36.5 12/30/2018   Lab Results  Component Value Date   WBC 5.4 09/25/2018   HGB 12.7 09/25/2018   HCT 40.4 09/25/2018   MCV 94.6 09/25/2018   PLT 229 09/25/2018   Lab Results  Component Value Date   IRON 44 02/20/2018   TIBC 347 05/29/2017    Attestation Statements:   Reviewed by clinician on day of visit: allergies, medications, problem list, medical history, surgical history, family history, social history, and previous encounter notes.  Coral Ceo, CMA, am acting as transcriptionist for Coralie Common, MD.   I have reviewed the above documentation for accuracy and completeness, and I agree with the above. - Coralie Common, MD

## 2020-12-09 ENCOUNTER — Ambulatory Visit: Payer: BC Managed Care – PPO | Admitting: Cardiology

## 2020-12-09 MED ORDER — TIRZEPATIDE 2.5 MG/0.5ML ~~LOC~~ SOAJ: 2.5000 mg | SUBCUTANEOUS | 0 refills | Status: DC

## 2020-12-16 ENCOUNTER — Encounter: Payer: Self-pay | Admitting: Nurse Practitioner

## 2020-12-16 DIAGNOSIS — J309 Allergic rhinitis, unspecified: Secondary | ICD-10-CM

## 2020-12-17 MED ORDER — CETIRIZINE HCL 10 MG PO TABS: 10.0000 mg | ORAL_TABLET | Freq: Every day | ORAL | 1 refills | Status: DC

## 2020-12-17 NOTE — Addendum Note (Signed)
Addended by: Marchia Bond on: 12/17/2020 01:45 PM   Modules accepted: Orders

## 2020-12-21 ENCOUNTER — Other Ambulatory Visit: Payer: Self-pay | Admitting: Nurse Practitioner

## 2020-12-21 DIAGNOSIS — E782 Mixed hyperlipidemia: Secondary | ICD-10-CM

## 2020-12-22 ENCOUNTER — Encounter: Payer: Self-pay | Admitting: Neurology

## 2020-12-22 ENCOUNTER — Encounter: Payer: Self-pay | Admitting: Nurse Practitioner

## 2020-12-22 ENCOUNTER — Ambulatory Visit (INDEPENDENT_AMBULATORY_CARE_PROVIDER_SITE_OTHER): Payer: BC Managed Care – PPO | Admitting: Neurology

## 2020-12-22 VITALS — BP 152/97 | HR 89 | Ht 64.0 in | Wt 332.0 lb

## 2020-12-22 DIAGNOSIS — R251 Tremor, unspecified: Secondary | ICD-10-CM

## 2020-12-22 DIAGNOSIS — E782 Mixed hyperlipidemia: Secondary | ICD-10-CM

## 2020-12-22 NOTE — Patient Instructions (Signed)
It was very nice to meet you today. You have a rather mild and intermittent tremor of both hands. I do not see any signs or symptoms of parkinson's like disease or what we call parkinsonism.  I think your tremor may be from more than 1 issue but more likely than not, it is probably tied in with certain medications you are taking in particular, Latuda and Depakote.  Both these medications are known to cause tremors.  That does not necessarily mean you have to change the dose or come off of any of these especially if you have done well on these 2 medications.  For your tremor, I would not recommend any new medications, especially, since you already are on a beta-blocker and I would not recommend adding a second beta-blocker on your list of medications and in light of you already taking several medications.  I would be happy to reevaluate you in the future if the need arises but for now, we do not have to make a scheduled follow up appointment.   Please remember, that any kind of tremor may be exacerbated by anxiety, anger, nervousness, excitement, thyroid disease, dehydration, sleep deprivation, by caffeine, and low blood sugar values or blood sugar fluctuations.  You may be able to pay more attention to your tremor temporarily getting worse on the days where you happen to drink more caffeine versus the days where you drink decaf only.

## 2020-12-22 NOTE — Progress Notes (Signed)
And Depakote subjective:    Patient ID: Madison Palmer is a 55 y.o. female.  HPI    Star Age, MD, PhD [facility] [address]O. [place], [place] [address]  Dear Dr. Jearld Shines,   I saw your patient, Madison Palmer, upon your kind request in my neurologic clinic today for initial consultation of her tremors.  The patient is unaccompanied today.  As you know, Ms. Blaesing is a 55 year old left-handed woman with an underlying medical history of hyperparathyroidism, hyperlipidemia, hypertension, diverticulitis, glaucoma, reflux disease, left ventricular hypertrophy, lymphedema, PCOS, prediabetes, sleep apnea, thyroid disease, vitamin D deficiency, anemia, anxiety, arthritis, neck pain, low back pain, and morbid obesity with a BMI of over 50, who reports a bilateral hand tremor for the past few months.  The tremor is intermittent, more noticeable when she uses her left hand, not at rest, not necessarily when she holds something.  She is not aware of her family history as she is adopted.  Her 68 year old son does not have a tremor.  I reviewed your office note from 11/15/2020.  She had recent blood work, I reviewed the results in epic.  TSH and free T4 were normal on 09/01/2020. She uses a cane, she has back pain and chronic neck pain.  She had physical therapy for her neck pain and a recent epidural steroid injection into her lower cervical spine.  She is followed by Dr. Tamala Julian for this.  She has been on gabapentin.  Of note, she is on multiple medications, but her psychiatrist has her on Latuda and Depakote.  She has been on these 2 medications for the past 2 years.  She is a non-smoker and drinks no alcohol currently.  She does drink caffeine, likes to drink half-and-half coffee, but drinks several cups a day, on average 6 to 8 cups.  Sometimes she drinks decaf only.  She drinks occasional sweet tea and occasional [facility].  She tries to hydrate well with water.   She recently started [facility] injections.  The tremor does not affect her day-to-day activity.  She is able to do her job.  She has not noticed a progression necessarily.  She reports being compliant with her CPAP machine.  Her Past Medical History Is Significant For: Past Medical History:  Diagnosis Date   Acute on chronic respiratory failure with hypoxia and hypercapnia ([place]) 09/15/2018   Anemia    Anxiety    Arrhythmia    tachycardia   Arthritis    Chickenpox    Chronic respiratory failure with hypoxia ([place]) 05/10/2018   Formatting of this note might be different from the original. Last Assessment & Plan:  Continue 1 L continuous on exertion and during sleep. On her next visit we will check OSA on CPAP/room air to decide whether she needs to take oxygen along with her on her cruise in May   Depression    Diverticulitis    GERD (gastroesophageal reflux disease)    Glaucoma    Hyperlipidemia    Hyperparathyroidism ([place])    Hypertension    Inflammatory polyps of colon ([place])    LVH (left ventricular hypertrophy)    Lymphedema    PCOS (polycystic ovarian syndrome)    Prediabetes    Recurrent UTI    Sleep apnea    CPAP   Thyroid disease    Vitamin D deficiency     Her Past Surgical History Is Significant For: Past Surgical History:  Procedure Laterality Date  BREAST BIOPSY  2015   CESAREAN SECTION  2004   INNER EAR SURGERY     ear and sinus surgery   LAPAROSCOPIC REPAIR AND REMOVAL OF GASTRIC BAND     OOPHORECTOMY Left    TONSILLECTOMY AND ADENOIDECTOMY      Her Family History Is Significant For: Family History  Adopted: Yes  Problem Relation Age of Onset   Hearing loss Son        right    Healthy Son     Her Social History Is Significant For: Social History   Socioeconomic History   Marital status: Married    Spouse name: Not on file   Number of children: 1   Years of education: Not on file   Highest education level: Not on file  Occupational History    Occupation: Unemployed  Tobacco Use   Smoking status: Never   Smokeless tobacco: Never  Vaping Use   Vaping Use: Never used  Substance and Sexual Activity   Alcohol use: Yes    Comment: social   Drug use: No   Sexual activity: Not Currently  Other Topics Concern   Not on file  Social History Narrative   Lives with son    Currently separated from spouse   Left handed   Caffeine: coffee daily: half-caff 3 cups/day and decaf maybe 3-5 cups/day, occasional mtn dew, tea 1-2 times per week   Social Determinants of Health   Financial Resource Strain: Not on file  Food Insecurity: Not on file  Transportation Needs: Not on file  Physical Activity: Not on file  Stress: Not on file  Social Connections: Not on file    Her Allergies Are:  Allergies  Allergen Reactions   Other     Other reaction(s): Other (See Comments) Instructed not to take by Cardiology.   Fentanyl Hives   Levofloxacin Hives   Midazolam Hives   Pollen Extract     seasonal   Atorvastatin     Muscle pain in legs    Doxycycline Itching and Swelling    Pt reports she is not allergic to this and doesn't know why it says itching and swelling    Hydralazine Hcl Other (See Comments)    Hypercalcemia    Rosuvastatin     Abdominal pain  :   Her Current Medications Are:  Outpatient Encounter Medications as of 12/22/2020  Medication Sig   acetaminophen (TYLENOL) 500 MG tablet Take 1,000 mg by mouth 3 (three) times daily as needed for moderate pain or headache.   amLODipine (NORVASC) 5 MG tablet Take 1 tablet (5 mg total) by mouth daily.   cetirizine (ZYRTEC ALLERGY) 10 MG tablet Take 1 tablet (10 mg total) by mouth daily.   Cholecalciferol (VITAMIN D3 MAXIMUM STRENGTH) 125 MCG (5000 UT) capsule Take 5,000 Units by mouth daily.   cloNIDine (CATAPRES) 0.1 MG tablet 2 (two) times daily.    divalproex (DEPAKOTE) 250 MG DR tablet Take 1 tablet (250 mg total) by mouth at bedtime.   esomeprazole (NEXIUM) 40 MG capsule  TAKE ONE CAPSULE(40 MG TOTAL) BY MOUTH TWICE DAILY   fenofibrate (TRICOR) 145 MG tablet Take 1 tablet (145 mg total) by mouth daily.   fluticasone (FLONASE) 50 MCG/ACT nasal spray Place 2 sprays into both nostrils daily.   furosemide (LASIX) 40 MG tablet TAKE 1 TABLET(40 MG) BY MOUTH DAILY   gabapentin (NEURONTIN) 100 MG capsule Take 2 capsules (200 mg total) by mouth at bedtime.   labetalol (NORMODYNE) 300  MG tablet TAKE 2 TABLETS(600 MG) BY MOUTH TWICE DAILY (Patient taking differently: Takes 1 tablet twice a day.)   levothyroxine (SYNTHROID) 75 MCG tablet Take 1 tablet (75 mcg total) by mouth daily.   lisinopril (ZESTRIL) 40 MG tablet Take 1 tablet (40 mg total) by mouth daily.   Lurasidone HCl 60 MG TABS Take 1 tablet (60 mg total) by mouth in the morning.   metFORMIN (GLUCOPHAGE) 500 MG tablet Take 1 tablet (500 mg total) by mouth daily with breakfast.   Potassium Chloride ER 20 MEQ TBCR TAKE 1 TABLET BY MOUTH EVERY DAY   TART CHERRY PO Take by mouth daily.   tirzepatide [facility]) 2.5 MG/0.5ML Pen Inject 2.5 mg into the skin once a week. Patient has copay coupon   No facility-administered encounter medications on file as of 12/22/2020.  :   Review of Systems:  Out of a complete 14 point review of systems, all are reviewed and negative with the exception of these symptoms as listed below:   Review of Systems  Neurological:        Patient is here for evaluation of her tremors. Patient reports tremors are mostly on her L side (arm and hand) and occasional on the R side. Tremors have been going on for a few months.    Objective:  Neurological Exam  Physical Exam Physical Examination:   Vitals:   12/22/20 1006  BP: (!) 152/97  Pulse: 89    General Examination: The patient is a very pleasant 55 y.o. female in no acute distress. She appears well-developed and well-nourished and well groomed.   HEENT: Normocephalic, atraumatic, pupils are equal, round and reactive to light,  extraocular tracking is well-preserved, neck is supple, good range of passive motion, no carotid bruits.  She has no facial masking, she has corrective eyeglasses in place.  Speech is clear without hypophonia, voice tremor or dysarthria.  Hearing is grossly intact, airway examination reveals mild mouth dryness, benign findings otherwise, tongue protrudes centrally and palate elevates symmetrically.    Chest: Clear to auscultation without wheezing, rhonchi or crackles noted.  Heart: S1+S2+0, regular and normal without murmurs, rubs or gallops noted.   Abdomen: Soft, non-tender and non-distended.  Extremities: There is no obvious edema in the distal lower extremities.   Skin: Warm and dry without trophic changes noted.   Musculoskeletal: exam reveals no obvious joint deformities.   Neurologically:  Mental status: The patient is awake, alert and oriented in all 4 spheres. Her immediate and remote memory, attention, language skills and fund of knowledge are appropriate. There is no evidence of aphasia, agnosia, apraxia or anomia. Speech is clear with normal prosody and enunciation. Thought process is linear. Mood is normal and affect is normal.  Cranial nerves II - XII are as described above under HEENT exam.  On 12/22/2020: On Archimedes spiral drawing she has a slight tremor with the right hand, with the left hand there is no obvious trembling, she has no tremor with handwriting, no micrographia, very legible.   She has a intermittent and subtle postural tremor in both upper extremities, no action tremor, no intention tremor, no resting tremor.  Motor exam: Normal bulk, strength and tone is noted. There is no drift, r rebound. Romberg is negative. Reflexes are 1+ in the upper extremities, absent in both knees, trace in the ankles.  Fine motor skills and coordination: intact with normal finger taps, normal hand movements, normal rapid alternating patting, normal foot taps and normal foot agility.  Cerebellar testing: No dysmetria or intention tremor on finger to nose testing. Heel to shin is unremarkable bilaterally. There is no truncal or gait ataxia.  Sensory exam: intact to light touch in the upper and lower extremities.  Gait, station and balance: She stands without difficulty, posture is age-appropriate, she walks with a cane, she has preserved arm swing when walking wit  Assessment and Plan:  Assessment and Plan:  In summary, ROXXANNE EDHOLM is a very pleasant 55 y.o.-year old female with an underlying medical history of hyperparathyroidism, hyperlipidemia, hypertension, diverticulitis, glaucoma, reflux disease, left ventricular hypertrophy, lymphedema, PCOS, prediabetes, sleep apnea, thyroid disease, vitamin D deficiency, anemia, anxiety, arthritis, neck pain, low back pain, and morbid obesity with a BMI of over 50, who presents for evaluation of her hand tremor of several months duration.  The tremor is intermittent, more so on the left side, not necessarily progressive and not typically very bothersome to her.  Exam findings are fairly benign today.  She certainly does not have any telltale findings of Parkinson's disease or atypical parkinsonism.  She is advised that certain medications could be a contributor, in particular her Latuda and Depakote.  This does not typically mean that she has to come off of these medications especially since she has done well with her medications for her mood disorder.  She is advised to be cautious with caffeine.  She does like to drink half caff coffee and may drink several cups a day.  We talked about common tremor triggers.  She had her thyroid function tested on a regular basis.  She is advised to stay well-hydrated, well rested, monitor for triggers, limit her caffeine intake.  I did not suggest any new medication for symptomatic tremor treatment.  In particular, she is already on a beta-blocker.  I would not recommend adding a second beta-blocker or  another symptomatic tremor medication.  History is not classic for essential tremor.  She is agreeable with monitoring her symptoms.  I would be happy to reevaluate her in the future if the need arises.  She is advised to follow-up with her primary care and her specialists as scheduled.  I answered all her questions today and she was in agreement.  Thank you very much for allowing me to participate in the care of this nice patient. If I can be of any further assistance to you please do not hesitate to call me at [phone].  Sincerely,   Star Age, MD, PhD

## 2020-12-24 ENCOUNTER — Other Ambulatory Visit: Payer: Self-pay | Admitting: Nurse Practitioner

## 2020-12-24 DIAGNOSIS — E782 Mixed hyperlipidemia: Secondary | ICD-10-CM

## 2020-12-26 MED ORDER — FENOFIBRATE 145 MG PO TABS: 145.0000 mg | ORAL_TABLET | Freq: Every day | ORAL | 3 refills | Status: DC

## 2020-12-28 ENCOUNTER — Encounter (INDEPENDENT_AMBULATORY_CARE_PROVIDER_SITE_OTHER): Payer: Self-pay | Admitting: Family Medicine

## 2020-12-28 ENCOUNTER — Ambulatory Visit
Admission: RE | Admit: 2020-12-28 | Discharge: 2020-12-28 | Disposition: A | Discharge status: Home / Self Care | Payer: BC Managed Care – PPO | Source: Ambulatory Visit | Attending: "Endocrinology | Admitting: "Endocrinology

## 2020-12-28 ENCOUNTER — Ambulatory Visit (INDEPENDENT_AMBULATORY_CARE_PROVIDER_SITE_OTHER): Payer: BC Managed Care – PPO | Admitting: Family Medicine

## 2020-12-28 ENCOUNTER — Other Ambulatory Visit: Payer: Self-pay

## 2020-12-28 VITALS — BP 158/96 | HR 82 | Temp 98.5°F | Ht 63.0 in | Wt 328.0 lb

## 2020-12-28 DIAGNOSIS — Z9189 Other specified personal risk factors, not elsewhere classified: Secondary | ICD-10-CM | POA: Diagnosis not present

## 2020-12-28 DIAGNOSIS — R7303 Prediabetes: Secondary | ICD-10-CM

## 2020-12-28 DIAGNOSIS — K5909 Other constipation: Secondary | ICD-10-CM

## 2020-12-28 DIAGNOSIS — E210 Primary hyperparathyroidism: Secondary | ICD-10-CM

## 2020-12-28 DIAGNOSIS — Z6841 Body Mass Index (BMI) 40.0 and over, adult: Secondary | ICD-10-CM

## 2020-12-28 DIAGNOSIS — I10 Essential (primary) hypertension: Secondary | ICD-10-CM | POA: Diagnosis not present

## 2020-12-28 MED ORDER — TIRZEPATIDE 5 MG/0.5ML ~~LOC~~ SOAJ: 5.0000 mg | SUBCUTANEOUS | 0 refills | Status: DC

## 2020-12-28 NOTE — Progress Notes (Signed)
Chief Complaint:   OBESITY Madison Palmer is here to discuss her progress with her obesity treatment plan along with follow-up of her obesity related diagnoses. Madison Palmer is on keeping a food journal and adhering to recommended goals of 1650-1800 calories and 125+ grams protein and states she is following her eating plan approximately 85% of the time.  Today's visit was #: 23 Starting weight: 357 lbs Starting date: 02/27/2018 Today's weight: 328 lbs Today's date: 12/28/2020 Total lbs lost to date: 29 Total lbs lost since last in-office visit: 0  Interim History: Madison Palmer has bone density test this morning and was discouraged with weight on the scale. She has increased her frequency of going to drive thru. She has been fairly consistent with tracking and has increased her calorie allotment. She recognizes that she could make better choices consistently when eating out in the morning. Pt joined the [employer].  Subjective:   1. Essential hypertension [place] component. BP elevated today. Pt denies chest pain/chest pressure/headache.  2. Other constipation Madison Palmer is on Mounjaro. She does not need to take something consistently.  3. Pre-diabetes Pt is on Mounjaro 2.5 mg with no noticeable effects on initial dose. Her last A1c was 5.6.  4. At risk for side effect of medication Madison Palmer is at risk for side effects of medication due to increasing Mounjaro.  Assessment/Plan:   1. Essential hypertension Madison Palmer is to start monitoring BP at home 2-3 times a week. She is working on healthy weight loss and exercise to improve blood pressure control. We will watch for signs of hypotension as she continues her lifestyle modifications.   2. Other constipation Madison Palmer was informed that a decrease in bowel movement frequency is normal while losing weight, but stools should not be hard or painful. Orders and follow up as documented in Madison Palmer. Start Miralax daily.  Counseling Getting to  Good Bowel Health: Your goal is to have one soft bowel movement each day. Drink at least 8 glasses of water each day. Eat plenty of fiber (goal is over 25 grams each day). It is best to get most of your fiber from dietary sources which includes leafy green vegetables, fresh fruit, and whole grains. You may need to add fiber with the help of OTC fiber supplements. These include Metamucil, Citrucel, and Flaxseed. If you are still having trouble, try adding Miralax or Magnesium Citrate. If all of these changes do not work, Cabin crew.  3. Pre-diabetes Madison Palmer will continue to work on weight loss, exercise, and decreasing simple carbohydrates to help decrease the risk of diabetes. Increase Mounjaro to 5 mg weekly.  Increase and Refill- tirzepatide (MOUNJARO) 5 MG/0.5ML Pen; Inject 5 mg into the skin once a week.  Dispense: 6 mL; Refill: 0  4. At risk for side effect of medication Madison Palmer was given approximately 15 minutes of drug side effect counseling today.  We discussed side effect possibility and risk versus benefits. Madison Palmer agreed to the medication and will contact this office if these side effects are intolerable.  Repetitive spaced learning was employed today to elicit superior memory formation and behavioral change.  5. Obesity with current BMI of 58.2  Madison Palmer is currently in the action stage of change. As such, her goal is to continue with weight loss efforts. She has agreed to keeping a food journal and adhering to recommended goals of 1650-1800 calories and 125 grams protein.   Exercise goals: All adults should avoid inactivity. Some physical activity is better than  none, and adults who participate in any amount of physical activity gain some health benefits. Pt is to go to the [employer] 2 times a week.  Behavioral modification strategies: increasing lean protein intake, meal planning and cooking strategies, keeping healthy foods in the home, and planning for  success.  Madison Palmer has agreed to follow-up with our clinic in 3 weeks. She was informed of the importance of frequent follow-up visits to maximize her success with intensive lifestyle modifications for her multiple health conditions.   Objective:   Blood pressure (!) 158/96, pulse 82, temperature 98.5 F (36.9 C), height '5\' 3"'$  (1.6 m), weight (!) 328 lb (148.8 kg), last menstrual period 05/02/2017, SpO2 96 %. Body mass index is 58.1 kg/m.  General: Cooperative, alert, well developed, in no acute distress. HEENT: Conjunctivae and lids unremarkable. Cardiovascular: Regular rhythm.  Lungs: Normal work of breathing. Neurologic: No focal deficits.   Lab Results  Component Value Date   CREATININE 1.12 (H) 09/01/2020   BUN 24 09/01/2020   NA 142 09/01/2020   K 4.4 09/01/2020   CL 102 09/01/2020   CO2 24 09/01/2020   Lab Results  Component Value Date   ALT 11 06/10/2020   AST 12 06/10/2020   ALKPHOS 90 06/10/2020   BILITOT 0.3 06/10/2020   Lab Results  Component Value Date   HGBA1C 5.6 06/01/2020   HGBA1C 5.7 (H) 12/11/2019   HGBA1C 5.7 (H) 12/24/2018   HGBA1C 6.0 (H) 02/27/2018   HGBA1C 5.7 09/10/2017   Lab Results  Component Value Date   INSULIN 18.9 06/01/2020   INSULIN 11.4 12/11/2019   INSULIN 21.1 12/30/2018   INSULIN 9.6 02/27/2018   Lab Results  Component Value Date   TSH 2.340 09/01/2020   Lab Results  Component Value Date   CHOL 216 (H) 06/10/2020   HDL 50 06/10/2020   LDLCALC 138 (H) 06/10/2020   TRIG 154 (H) 06/10/2020   CHOLHDL 5.6 (H) 12/24/2018   Lab Results  Component Value Date   VD25OH 29.0 (L) 09/01/2020   VD25OH 19.7 (L) 06/01/2020   VD25OH 36.5 12/30/2018   Lab Results  Component Value Date   WBC 5.4 09/25/2018   HGB 12.7 09/25/2018   HCT 40.4 09/25/2018   MCV 94.6 09/25/2018   PLT 229 09/25/2018   Lab Results  Component Value Date   IRON 44 02/20/2018   TIBC 347 05/29/2017    Attestation Statements:   Reviewed by clinician  on day of visit: allergies, medications, problem list, medical history, surgical history, family history, social history, and previous encounter notes.  Coral Ceo, CMA, am acting as transcriptionist for Coralie Common, MD.   I have reviewed the above documentation for accuracy and completeness, and I agree with the above. - Coralie Common, MD

## 2021-01-03 ENCOUNTER — Other Ambulatory Visit (HOSPITAL_COMMUNITY): Payer: Self-pay | Admitting: Psychiatry

## 2021-01-03 DIAGNOSIS — F319 Bipolar disorder, unspecified: Secondary | ICD-10-CM

## 2021-01-04 ENCOUNTER — Other Ambulatory Visit: Payer: Self-pay

## 2021-01-04 ENCOUNTER — Ambulatory Visit
Admission: RE | Admit: 2021-01-04 | Discharge: 2021-01-04 | Disposition: A | Discharge status: Home / Self Care | Payer: BC Managed Care – PPO | Source: Ambulatory Visit | Attending: Nurse Practitioner | Admitting: Nurse Practitioner

## 2021-01-04 DIAGNOSIS — Z1231 Encounter for screening mammogram for malignant neoplasm of breast: Secondary | ICD-10-CM

## 2021-01-04 NOTE — Progress Notes (Signed)
[place] Phone: [phone] Subjective:   Fontaine No, am serving as a scribe for Dr. Hulan Saas.  This visit occurred during the SARS-CoV-2 public health emergency.  Safety protocols were in place, including screening questions prior to the visit, additional usage of staff PPE, and extensive cleaning of exam room while observing appropriate contact time as indicated for disinfecting solutions.   I'm seeing this patient by the request  of:  [name], Charlene Brooke, NP  CC: Neck pain follow-up  [id number]  11/17/2020 Acute onset of cervical radiculopathy.  Relate radicular symptoms going down the right arm in the C7 distribution.  Mild edema is noted as well.  Patient has been doing conservative therapy including formal physical therapy and now worsening.  Discussed with patient about different treatment options and we have elected to go with advanced imaging.  An MRI is necessary.  Patient could be a candidate for possible epidurals.  Follow-up again after imaging and will discuss further.  Updated 01/05/2021 Madison Palmer is a 55 y.o. female coming in with complaint of neck pain. Pain has gone from constant to intermittent. Notices increase in stiffness especially with turning to the L on the right hand side. Cannot say what makes pain worse.   IMPRESSION: Significantly motion limited evaluation.   1. 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 Mild to moderate canal stenosis at C4-C5 and C6-C7. 2. Suspected moderate foraminal stenosis on the left at C4-C5 and C5-C6 and the right at C6-C7. 3. Evaluation of the cord is particularly limited without definite/convincing cord signal abnormality.      Past Medical History:  Diagnosis Date   Acute on chronic respiratory failure with hypoxia and hypercapnia (HCC) 09/15/2018   Anemia     Anxiety    Arrhythmia    tachycardia   Arthritis    Chickenpox    Chronic respiratory failure with hypoxia ([place]) 05/10/2018   Formatting of this note might be different from the original. Last Assessment & Plan:  Continue 1 L continuous on exertion and during sleep. On her next visit we will check OSA on CPAP/room air to decide whether she needs to take oxygen along with her on her cruise in [place]   Depression    Diverticulitis    GERD (gastroesophageal reflux disease)    Glaucoma    Hyperlipidemia    Hyperparathyroidism ([place])    Hypertension    Inflammatory polyps of colon ([place])    LVH (left ventricular hypertrophy)    Lymphedema    PCOS (polycystic ovarian syndrome)    Prediabetes    Recurrent UTI    Sleep apnea    CPAP   Thyroid disease    Vitamin D deficiency    Past Surgical History:  Procedure Laterality Date   BREAST BIOPSY  2015   CESAREAN SECTION  2004   INNER EAR SURGERY     ear and sinus surgery   LAPAROSCOPIC REPAIR AND REMOVAL OF GASTRIC BAND     OOPHORECTOMY Left    TONSILLECTOMY AND ADENOIDECTOMY     Social History   Socioeconomic History   Marital status: Married    Spouse name: Not on file   Number of children: 1   Years of education: Not on file   Highest education level: Not on file  Occupational History   Occupation: Unemployed  Tobacco Use   Smoking status: Never  Smokeless tobacco: Never  Vaping Use   Vaping Use: Never used  Substance and Sexual Activity   Alcohol use: Yes    Comment: social   Drug use: No   Sexual activity: Not Currently  Other Topics Concern   Not on file  Social History Narrative   Lives with son    Currently separated from spouse   Left handed   Caffeine: coffee daily: half-caff 3 cups/day and decaf maybe 3-5 cups/day, occasional mtn dew, tea 1-2 times per week   Social Determinants of Health   Financial Resource Strain: Not on file  Food Insecurity: Not on file  Transportation Needs: Not on file  Physical  Activity: Not on file  Stress: Not on file  Social Connections: Not on file   Allergies  Allergen Reactions   Other     Other reaction(s): Other (See Comments) Instructed not to take by Cardiology.   Fentanyl Hives   Levofloxacin Hives   Midazolam Hives   Pollen Extract     seasonal   Atorvastatin     Muscle pain in legs    Doxycycline Itching and Swelling    Pt reports she is not allergic to this and doesn't know why it says itching and swelling    Hydralazine Hcl Other (See Comments)    Hypercalcemia    Rosuvastatin     Abdominal pain   Family History  Adopted: Yes  Problem Relation Age of Onset   Hearing loss Son        right    Healthy Son     Current Outpatient Medications (Endocrine & Metabolic):    levothyroxine (SYNTHROID) 75 MCG tablet, Take 1 tablet (75 mcg total) by mouth daily.   metFORMIN (GLUCOPHAGE) 500 MG tablet, Take 1 tablet (500 mg total) by mouth daily with breakfast.   tirzepatide (MOUNJARO) 5 MG/0.5ML Pen, Inject 5 mg into the skin once a week.  Current Outpatient Medications (Cardiovascular):    amLODipine (NORVASC) 5 MG tablet, Take 1 tablet (5 mg total) by mouth daily.   cloNIDine (CATAPRES) 0.1 MG tablet, 2 (two) times daily.    fenofibrate (TRICOR) 145 MG tablet, Take 1 tablet (145 mg total) by mouth daily.   furosemide (LASIX) 40 MG tablet, TAKE 1 TABLET(40 MG) BY MOUTH DAILY   labetalol (NORMODYNE) 300 MG tablet, TAKE 2 TABLETS(600 MG) BY MOUTH TWICE DAILY (Patient taking differently: Takes 1 tablet twice a day.)   lisinopril (ZESTRIL) 40 MG tablet, Take 1 tablet (40 mg total) by mouth daily.  Current Outpatient Medications (Respiratory):    cetirizine (ZYRTEC ALLERGY) 10 MG tablet, Take 1 tablet (10 mg total) by mouth daily.   fluticasone (FLONASE) 50 MCG/ACT nasal spray, Place 2 sprays into both nostrils daily.  Current Outpatient Medications (Analgesics):    acetaminophen (TYLENOL) 500 MG tablet, Take 1,000 mg by mouth 3 (three) times  daily as needed for moderate pain or headache.   Current Outpatient Medications (Other):    Cholecalciferol (VITAMIN D3 MAXIMUM STRENGTH) 125 MCG (5000 UT) capsule, Take 5,000 Units by mouth daily.   divalproex (DEPAKOTE) 250 MG DR tablet, Take 1 tablet (250 mg total) by mouth at bedtime.   esomeprazole (NEXIUM) 40 MG capsule, TAKE ONE CAPSULE(40 MG TOTAL) BY MOUTH TWICE DAILY   gabapentin (NEURONTIN) 100 MG capsule, Take 2 capsules (200 mg total) by mouth at bedtime.   Lurasidone HCl 60 MG TABS, Take 1 tablet (60 mg total) by mouth in the morning.   Polyethylene Glycol 3350 (  MIRALAX PO), Take by mouth.   Potassium Chloride ER 20 MEQ TBCR, TAKE 1 TABLET BY MOUTH EVERY DAY   TART CHERRY PO, Take by mouth daily.    Review of Systems:  No headache, visual changes, nausea, vomiting, diarrhea, constipation, dizziness, abdominal pain, skin rash, fevers, chills, night sweats, weight loss, swollen lymph nodes,, joint swelling, chest pain, shortness of breath, mood changes. POSITIVE muscle aches, body aches  Objective  Blood pressure (!) 158/90, pulse 86, height '5\' 3"'$  (1.6 m), weight (!) 332 lb (150.6 kg), last menstrual period 05/02/2017, SpO2 95 %.   General: No apparent distress alert and oriented x3 mood and affect normal, dressed appropriately.  Patient does appear to be minorly anxious morbidly obese HEENT: Pupils equal, extraocular movements intact  Respiratory: Patient's speak in full sentences and does not appear short of breath  Cardiovascular: No lower extremity edema, non tender, no erythema  Gait antalgic walking with the aid of a cane Neck exam shows the patient does have some loss of lordosis.  Patient has some mild positive Spurling's.  Patient has improvement in range of motion from previous exam.   Impression and Recommendations:     The above documentation has been reviewed and is accurate and complete Lyndal Pulley, DO

## 2021-01-05 ENCOUNTER — Ambulatory Visit (INDEPENDENT_AMBULATORY_CARE_PROVIDER_SITE_OTHER): Payer: BC Managed Care – PPO | Admitting: Family Medicine

## 2021-01-05 ENCOUNTER — Encounter: Payer: Self-pay | Admitting: Family Medicine

## 2021-01-05 VITALS — BP 158/90 | HR 86 | Ht 63.0 in | Wt 332.0 lb

## 2021-01-05 DIAGNOSIS — M5010 Cervical disc disorder with radiculopathy, unspecified cervical region: Secondary | ICD-10-CM

## 2021-01-05 DIAGNOSIS — M5412 Radiculopathy, cervical region: Secondary | ICD-10-CM | POA: Diagnosis not present

## 2021-01-05 NOTE — Patient Instructions (Signed)
Continue gabapentin Epidural C7-T1- Call [phone] See me 4-5 weeks after injection

## 2021-01-05 NOTE — Assessment & Plan Note (Signed)
Patient is making significant progress at this time.  I am impressed with how much improvement with the epidural at 60%.  We discussed different treatment options in great detail including the possibility of continuing with formal physical therapy, repeating epidural or the possibility of surgical intervention.  Patient would like to try another epidural to see if this would improve the pain even more.  Continue the conservative therapy as well as the gabapentin.  Follow-up with me again in 4 to 6 weeks otherwise.  Total time reviewing patient's imaging including MRIs as well as previous notes and treatments greater than 32 minutes

## 2021-01-07 ENCOUNTER — Other Ambulatory Visit: Payer: Self-pay

## 2021-01-07 ENCOUNTER — Telehealth (INDEPENDENT_AMBULATORY_CARE_PROVIDER_SITE_OTHER): Payer: BC Managed Care – PPO | Admitting: Psychiatry

## 2021-01-07 ENCOUNTER — Encounter (HOSPITAL_COMMUNITY): Payer: Self-pay | Admitting: Psychiatry

## 2021-01-07 ENCOUNTER — Other Ambulatory Visit (HOSPITAL_COMMUNITY): Payer: Self-pay | Admitting: Psychiatry

## 2021-01-07 DIAGNOSIS — F419 Anxiety disorder, unspecified: Secondary | ICD-10-CM | POA: Diagnosis not present

## 2021-01-07 DIAGNOSIS — F319 Bipolar disorder, unspecified: Secondary | ICD-10-CM

## 2021-01-07 MED ORDER — LURASIDONE HCL 60 MG PO TABS: 60.0000 mg | ORAL_TABLET | Freq: Every morning | ORAL | 2 refills | Status: DC

## 2021-01-07 MED ORDER — DIVALPROEX SODIUM 250 MG PO DR TAB: 250.0000 mg | DELAYED_RELEASE_TABLET | Freq: Every day | ORAL | 2 refills | Status: DC

## 2021-01-07 NOTE — Progress Notes (Signed)
Virtual Visit via Telephone Note  I connected with Madison Palmer on 01/07/21 at 10:20 AM EDT by telephone and verified that I am speaking with the correct person using two identifiers.  Location: Patient: Home Provider: Home Office   I discussed the limitations, risks, security and privacy concerns of performing an evaluation and management service by telephone and the availability of in person appointments. I also discussed with the patient that there may be a patient responsible charge related to this service. The patient expressed understanding and agreed to proceed.   History of Present Illness: Patient is evaluated by phone session.  On the last visit we decreased Depakote as she was feeling tired and lack of motivation.  She is only taking 250 mg at bedtime and she noticed improvement in her energy.  She denies any mania, psychosis, hallucination.  She also taking Latuda that is helping her paranoia.  She has no tremor or shakes or any EPS.  Her living situation is good and she is happy that son started college.  Patient like to keep the current medication.  Past psychiatric history; H/O depression since early age. H/O inpatient at [facility] in May 20202 for delusion, psychosis and violent behavior. D/C on Depakote 500 mg BID, Latuda 60 mg daily and Trilafon 12 mg a day. No h/o suicidal attempt, nightmares, panic attack or OCD symptoms.  No h/o drinking and drug use.    Psychiatric Specialty Exam: Physical Exam  Review of Systems  Weight (!) 328 lb (148.8 kg), last menstrual period 05/02/2017.There is no height or weight on file to calculate BMI.  General Appearance: NA  Eye Contact:  NA  Speech:  Slow  Volume:  Normal  Mood:  Euthymic  Affect:  NA  Thought Process:  Goal Directed  Orientation:  Full (Time, Place, and Person)  Thought Content:  Logical  Suicidal Thoughts:  No  Homicidal Thoughts:  No  Memory:  Immediate;   Good Recent;   Fair Remote;    Fair  Judgement:  Intact  Insight:  Present  Psychomotor Activity:  NA  Concentration:  Concentration: Fair and Attention Span: Fair  Recall:  [employer] of Knowledge:  Good  Language:  Good  Akathisia:  No  Handed:  Right  AIMS (if indicated):     Assets:  Communication Skills Desire for Improvement Housing Resilience Social Support  ADL's:  Intact  Cognition:  WNL  Sleep:   ok      Assessment and Plan: Bipolar disorder type I.  Anxiety.  Patient doing better since we cut down the Depakote dose.  She has more energy and motivated to do things.  She denies any mania or any psychosis.  Continue Latuda 60 mg daily and Depakote 250 mg at night.  Recommended to call us back if she has any question or any concern.  Follow-up in 3 months.  Follow Up Instructions:    I discussed the assessment and treatment plan with the patient. The patient was provided an opportunity to ask questions and all were answered. The patient agreed with the plan and demonstrated an understanding of the instructions.   The patient was advised to call back or seek an in-person evaluation if the symptoms worsen or if the condition fails to improve as anticipated.  I provided 19 minutes of non-face-to-face time during this encounter.   Kathlee Nations, MD

## 2021-01-11 LAB — HM DIABETES EYE EXAM

## 2021-01-13 ENCOUNTER — Other Ambulatory Visit: Payer: Self-pay

## 2021-01-13 ENCOUNTER — Ambulatory Visit
Admission: RE | Admit: 2021-01-13 | Discharge: 2021-01-13 | Disposition: A | Discharge status: Home / Self Care | Payer: BC Managed Care – PPO | Source: Ambulatory Visit | Attending: Family Medicine | Admitting: Family Medicine

## 2021-01-13 DIAGNOSIS — M5412 Radiculopathy, cervical region: Secondary | ICD-10-CM

## 2021-01-13 MED ORDER — TRIAMCINOLONE ACETONIDE 40 MG/ML IJ SUSP (RADIOLOGY)
60.0000 mg | Freq: Once | INTRAMUSCULAR | Status: AC
  Administered 2021-01-13: 60 mg via EPIDURAL

## 2021-01-13 MED ORDER — IOPAMIDOL (ISOVUE-M 300) INJECTION 61%
1.0000 mL | Freq: Once | INTRAMUSCULAR | Status: AC
  Administered 2021-01-13: 1 mL via EPIDURAL

## 2021-01-13 NOTE — Discharge Instructions (Signed)

## 2021-01-18 ENCOUNTER — Encounter (INDEPENDENT_AMBULATORY_CARE_PROVIDER_SITE_OTHER): Payer: Self-pay | Admitting: Family Medicine

## 2021-01-18 ENCOUNTER — Other Ambulatory Visit: Payer: Self-pay

## 2021-01-18 ENCOUNTER — Ambulatory Visit (INDEPENDENT_AMBULATORY_CARE_PROVIDER_SITE_OTHER): Payer: BC Managed Care – PPO | Admitting: Family Medicine

## 2021-01-18 VITALS — BP 174/96 | HR 95 | Temp 98.1°F | Ht 63.0 in | Wt 331.0 lb

## 2021-01-18 DIAGNOSIS — I10 Essential (primary) hypertension: Secondary | ICD-10-CM

## 2021-01-18 DIAGNOSIS — Z6841 Body Mass Index (BMI) 40.0 and over, adult: Secondary | ICD-10-CM | POA: Diagnosis not present

## 2021-01-18 NOTE — Progress Notes (Signed)
Chief Complaint:   OBESITY Madison Palmer is here to discuss her progress with her obesity treatment plan along with follow-up of her obesity related diagnoses. Madison Palmer is on keeping a food journal and adhering to recommended goals of 1650-1800 calories and 125+ grams protein and states she is following her eating plan approximately 100% of the time. Madison Palmer states she is not currently exercising.  Today's visit was #: 15 Starting weight: 357 lbs Starting date: 02/27/2018 Today's weight: 331 lbs Today's date: 01/18/2021 Total lbs lost to date: 26 Total lbs lost since last in-office visit: 0  Interim History: Madison Palmer had 3 good weeks- ~1825 calories/day with one week of never exceeding 1800 calories. She is getting 125 grams protein a day. She is supposed to start 5 mg of Mounjaro today. Pt noticed 1 episode of GI upset after eating too much. She is going to [place], [place] area tomorrow. Pt voices, for her son's fall break, they may be going to [facility].  Subjective:   1. Essential hypertension BP very elevated today- significant stress today about her son being left alone at [facility] for driver's license. Pt denies chest pain/chest pressure/headache.  Assessment/Plan:   1. Essential hypertension Madison Palmer is working on healthy weight loss and exercise to improve blood pressure control. We will watch for signs of hypotension as she continues her lifestyle modifications. Follow up BP at next appt. Follow up with cardiology if BP is still elevated. Pt recognizes she needs to check BP at home 3-4 times a week.  2. Obesity with current BMI of 58.7  Madison Palmer is currently in the action stage of change. As such, her goal is to continue with weight loss efforts. She has agreed to keeping a food journal and adhering to recommended goals of 1650-1800 calories and 125+ grams protein.   Exercise goals:  Pt wants to start swimming 2-3 times a week.  Behavioral modification strategies: increasing lean  protein intake, meal planning and cooking strategies, keeping healthy foods in the home, and keeping a strict food journal.  Madison Palmer has agreed to follow-up with our clinic in 3-4 weeks. She was informed of the importance of frequent follow-up visits to maximize her success with intensive lifestyle modifications for her multiple health conditions.   Objective:   Blood pressure (!) 174/96, pulse 95, temperature 98.1 F (36.7 C), height 5\' 3"  (1.6 m), weight (!) 331 lb (150.1 kg), last menstrual period 05/02/2017, SpO2 98 %. Body mass index is 58.63 kg/m.  General: Cooperative, alert, well developed, in no acute distress. HEENT: Conjunctivae and lids unremarkable. Cardiovascular: Regular rhythm.  Lungs: Normal work of breathing. Neurologic: No focal deficits.   Lab Results  Component Value Date   CREATININE 1.12 (H) 09/01/2020   BUN 24 09/01/2020   NA 142 09/01/2020   K 4.4 09/01/2020   CL 102 09/01/2020   CO2 24 09/01/2020   Lab Results  Component Value Date   ALT 11 06/10/2020   AST 12 06/10/2020   ALKPHOS 90 06/10/2020   BILITOT 0.3 06/10/2020   Lab Results  Component Value Date   HGBA1C 5.6 06/01/2020   HGBA1C 5.7 (H) 12/11/2019   HGBA1C 5.7 (H) 12/24/2018   HGBA1C 6.0 (H) 02/27/2018   HGBA1C 5.7 09/10/2017   Lab Results  Component Value Date   INSULIN 18.9 06/01/2020   INSULIN 11.4 12/11/2019   INSULIN 21.1 12/30/2018   INSULIN 9.6 02/27/2018   Lab Results  Component Value Date   TSH 2.340 09/01/2020  Lab Results  Component Value Date   CHOL 216 (H) 06/10/2020   HDL 50 06/10/2020   LDLCALC 138 (H) 06/10/2020   TRIG 154 (H) 06/10/2020   CHOLHDL 5.6 (H) 12/24/2018   Lab Results  Component Value Date   VD25OH 29.0 (L) 09/01/2020   VD25OH 19.7 (L) 06/01/2020   VD25OH 36.5 12/30/2018   Lab Results  Component Value Date   WBC 5.4 09/25/2018   HGB 12.7 09/25/2018   HCT 40.4 09/25/2018   MCV 94.6 09/25/2018   PLT 229 09/25/2018   Lab Results   Component Value Date   IRON 44 02/20/2018   TIBC 347 05/29/2017    Attestation Statements:   Reviewed by clinician on day of visit: allergies, medications, problem list, medical history, surgical history, family history, social history, and previous encounter notes.  Time spent on visit including pre-visit chart review and post-visit care and charting was 15 minutes.   Coral Ceo, CMA, am acting as transcriptionist for Coralie Common, MD.   I have reviewed the above documentation for accuracy and completeness, and I agree with the above. - Coralie Common, MD

## 2021-01-20 ENCOUNTER — Other Ambulatory Visit: Payer: BC Managed Care – PPO

## 2021-02-07 ENCOUNTER — Other Ambulatory Visit: Payer: Self-pay | Admitting: Gastroenterology

## 2021-02-07 ENCOUNTER — Other Ambulatory Visit: Payer: Self-pay | Admitting: Cardiology

## 2021-02-14 ENCOUNTER — Encounter (INDEPENDENT_AMBULATORY_CARE_PROVIDER_SITE_OTHER): Payer: Self-pay | Admitting: Family Medicine

## 2021-02-15 ENCOUNTER — Other Ambulatory Visit (INDEPENDENT_AMBULATORY_CARE_PROVIDER_SITE_OTHER): Payer: Self-pay

## 2021-02-15 ENCOUNTER — Ambulatory Visit (INDEPENDENT_AMBULATORY_CARE_PROVIDER_SITE_OTHER): Payer: BC Managed Care – PPO | Admitting: Family Medicine

## 2021-02-15 DIAGNOSIS — R7303 Prediabetes: Secondary | ICD-10-CM

## 2021-02-15 MED ORDER — TIRZEPATIDE 5 MG/0.5ML ~~LOC~~ SOAJ: 5.0000 mg | SUBCUTANEOUS | 0 refills | Status: DC

## 2021-02-15 NOTE — Telephone Encounter (Signed)
Yes, x 1

## 2021-02-15 NOTE — Telephone Encounter (Signed)
Okay to fill the 5mg ?

## 2021-02-16 NOTE — Progress Notes (Deleted)
[place] Phone: [phone] Subjective:    I'm seeing this patient by the request  of:  [name], Charlene Brooke, NP  CC:   [id number]  01/05/2021 Patient is making significant progress at this time.  I am impressed with how much improvement with the epidural at 60%.  We discussed different treatment options in great detail including the possibility of continuing with formal physical therapy, repeating epidural or the possibility of surgical intervention.  Patient would like to try another epidural to see if this would improve the pain even more.  Continue the conservative therapy as well as the gabapentin.  Follow-up with me again in 4 to 6 weeks otherwise.  Total time reviewing patient's imaging including MRIs as well as previous notes and treatments greater than 32 minutes  Updated 02/17/2021 Madison Palmer is a 55 y.o. female coming in with complaint of back pain. Epi on 01/13/2021  Onset-  Location Duration-  Character- Aggravating factors- Reliving factors-  Therapies tried-  Severity-     Past Medical History:  Diagnosis Date   Acute on chronic respiratory failure with hypoxia and hypercapnia (HCC) 09/15/2018   Anemia    Anxiety    Arrhythmia    tachycardia   Arthritis    Chickenpox    Chronic respiratory failure with hypoxia ([place]) 05/10/2018   Formatting of this note might be different from the original. Last Assessment & Plan:  Continue 1 L continuous on exertion and during sleep. On her next visit we will check OSA on CPAP/room air to decide whether she needs to take oxygen along with her on her cruise in May   Depression    Diverticulitis    GERD (gastroesophageal reflux disease)    Glaucoma    Hyperlipidemia    Hyperparathyroidism ([place])    Hypertension    Inflammatory polyps of colon ([place])    LVH (left ventricular hypertrophy)    Lymphedema    PCOS (polycystic ovarian syndrome)     Prediabetes    Recurrent UTI    Sleep apnea    CPAP   Thyroid disease    Vitamin D deficiency    Past Surgical History:  Procedure Laterality Date   BREAST BIOPSY  2015   CESAREAN SECTION  2004   INNER EAR SURGERY     ear and sinus surgery   LAPAROSCOPIC REPAIR AND REMOVAL OF GASTRIC BAND     OOPHORECTOMY Left    TONSILLECTOMY AND ADENOIDECTOMY     Social History   Socioeconomic History   Marital status: Married    Spouse name: Not on file   Number of children: 1   Years of education: Not on file   Highest education level: Not on file  Occupational History   Occupation: Unemployed  Tobacco Use   Smoking status: Never   Smokeless tobacco: Never  Vaping Use   Vaping Use: Never used  Substance and Sexual Activity   Alcohol use: Yes    Comment: social   Drug use: No   Sexual activity: Not Currently  Other Topics Concern   Not on file  Social History Narrative   Lives with son    Currently separated from spouse   Left handed   Caffeine: coffee daily: half-caff 3 cups/day and decaf maybe 3-5 cups/day, occasional mtn dew, tea 1-2 times per week   Social Determinants of Health   Financial Resource Strain: Not on file  Food Insecurity: Not  on file  Transportation Needs: Not on file  Physical Activity: Not on file  Stress: Not on file  Social Connections: Not on file   Allergies  Allergen Reactions   Other     Other reaction(s): Other (See Comments) Instructed not to take by Cardiology.   Fentanyl Hives   Levofloxacin Hives   Midazolam Hives   Pollen Extract     seasonal   Atorvastatin     Muscle pain in legs    Doxycycline Itching and Swelling    Pt reports she is not allergic to this and doesn't know why it says itching and swelling    Hydralazine Hcl Other (See Comments)    Hypercalcemia    Rosuvastatin     Abdominal pain   Family History  Adopted: Yes  Problem Relation Age of Onset   Hearing loss Son        right    Healthy Son     Current  Outpatient Medications (Endocrine & Metabolic):    levothyroxine (SYNTHROID) 75 MCG tablet, Take 1 tablet (75 mcg total) by mouth daily.   metFORMIN (GLUCOPHAGE) 500 MG tablet, Take 1 tablet (500 mg total) by mouth daily with breakfast.   tirzepatide (MOUNJARO) 5 MG/0.5ML Pen, Inject 5 mg into the skin once a week.  Current Outpatient Medications (Cardiovascular):    amLODipine (NORVASC) 5 MG tablet, Take 1 tablet (5 mg total) by mouth daily.   cloNIDine (CATAPRES) 0.1 MG tablet, 2 (two) times daily.    fenofibrate (TRICOR) 145 MG tablet, Take 1 tablet (145 mg total) by mouth daily.   furosemide (LASIX) 40 MG tablet, TAKE 1 TABLET(40 MG) BY MOUTH DAILY   labetalol (NORMODYNE) 300 MG tablet, TAKE 2 TABLETS(600 MG) BY MOUTH TWICE DAILY (Patient taking differently: Takes 1 tablet twice a day.)   lisinopril (ZESTRIL) 40 MG tablet, Take 1 tablet (40 mg total) by mouth daily.  Current Outpatient Medications (Respiratory):    cetirizine (ZYRTEC ALLERGY) 10 MG tablet, Take 1 tablet (10 mg total) by mouth daily.   fluticasone (FLONASE) 50 MCG/ACT nasal spray, Place 2 sprays into both nostrils daily.  Current Outpatient Medications (Analgesics):    acetaminophen (TYLENOL) 500 MG tablet, Take 1,000 mg by mouth 3 (three) times daily as needed for moderate pain or headache.   Current Outpatient Medications (Other):    Cholecalciferol (VITAMIN D3 MAXIMUM STRENGTH) 125 MCG (5000 UT) capsule, Take 5,000 Units by mouth daily.   divalproex (DEPAKOTE) 250 MG DR tablet, Take 1 tablet (250 mg total) by mouth at bedtime.   esomeprazole (NEXIUM) 40 MG capsule, TAKE ONE CAPSULE BY MOUTH TWICE DAILY   gabapentin (NEURONTIN) 100 MG capsule, Take 2 capsules (200 mg total) by mouth at bedtime.   Lurasidone HCl 60 MG TABS, Take 1 tablet (60 mg total) by mouth in the morning.   Polyethylene Glycol 3350 (MIRALAX PO), Take by mouth.   Potassium Chloride ER 20 MEQ TBCR, TAKE 1 TABLET BY MOUTH EVERY DAY   TART CHERRY PO,  Take by mouth daily.   Reviewed prior external information including notes and imaging from  primary care provider As well as notes that were available from care everywhere and other healthcare systems.  Past medical history, social, surgical and family history all reviewed in electronic medical record.  No pertanent information unless stated regarding to the chief complaint.   Review of Systems:  No headache, visual changes, nausea, vomiting, diarrhea, constipation, dizziness, abdominal pain, skin rash, fevers, chills, night sweats, weight  loss, swollen lymph nodes, body aches, joint swelling, chest pain, shortness of breath, mood changes. POSITIVE muscle aches  Objective  Last menstrual period 05/02/2017.   General: No apparent distress alert and oriented x3 mood and affect normal, dressed appropriately.  HEENT: Pupils equal, extraocular movements intact  Respiratory: Patient's speak in full sentences and does not appear short of breath  Cardiovascular: No lower extremity edema, non tender, no erythema  Gait normal with good balance and coordination.  MSK:  Non tender with full range of motion and good stability and symmetric strength and tone of shoulders, elbows, wrist, hip, knee and ankles bilaterally.     Impression and Recommendations:     The above documentation has been reviewed and is accurate and complete Belva Agee

## 2021-02-17 ENCOUNTER — Ambulatory Visit: Payer: BC Managed Care – PPO | Admitting: Family Medicine

## 2021-02-21 ENCOUNTER — Other Ambulatory Visit: Payer: Self-pay

## 2021-02-21 ENCOUNTER — Ambulatory Visit (INDEPENDENT_AMBULATORY_CARE_PROVIDER_SITE_OTHER): Payer: BC Managed Care – PPO | Admitting: Pharmacist Clinician (PhC)/ Clinical Pharmacy Specialist

## 2021-02-21 VITALS — BP 174/106 | HR 86

## 2021-02-21 DIAGNOSIS — I10 Essential (primary) hypertension: Secondary | ICD-10-CM | POA: Diagnosis not present

## 2021-02-21 MED ORDER — LABETALOL HCL 300 MG PO TABS: 450.0000 mg | ORAL_TABLET | Freq: Two times a day (BID) | ORAL | 3 refills | Status: DC

## 2021-02-21 NOTE — Progress Notes (Signed)
02/21/2021 TARSHA BLANDO Jul 02, 1965 [id number]   HPI:  CHENITA RUDA is a 55 y.o. female patient of Dr Percival Spanish, with a PMH below who presents today for hypertension clinic evaluation. She is originally from [place]. In November she had an ECHO that found LVEF of 60-65%. She has a PMH of HTN, obesity, arrythmias, OSA, DM, and dyspnea. She previously indicated no new hospitalizations or instances of SOB, orthopnea, weight gain, edema, or palpitations.   Today Mrs. Kozicki presented with concerns that her blood pressure readings at home were similar to those that she was seeing in the clinic and it was concerning to her. Previous notes indicate that she is prescribed to take labetalol 353m 2 tablets (6016m BID but she has never taken this dose; she has been taking 3006mID. Her blood pressure today was 174/106 and heart rate 86. Patient reports no changes in breathing issues - still struggles to walk without having to take a pause.   Past Medical History: Prediabetes 06/01/20: A1C 5.6  Metformin 500m77m Tirzepatide 5mg 47mN  Amlodipine 5mg C49midine 0.1mg BI68misinopril 40mg  L49malol 300mg (pt90ming 1T QD)     Blood Pressure Goal:  130/80  Current Medications: Amlodipine 5mg, Clon10mne 0.1mg BID, F68msemide 40mg, Labet66m 300mg 2T BID 22m be taking 1T BID), Lisinopril 40mg   Family84m adopted; no knowledge of family history.   Social Hx: No tobacco, no alcohol.   Diet: Predominately fast food (loves [employer]). Is eating 125g of protein/day per weight loss clinic; tracked with MyFitnessPal. Appetite is diminishing per patient but she makes the effort to meet her protein goal. Not eating a lot of fruits and veggies. Caloric intake set to 1600-1800 calories/day. Drinks 1/2 decaf 1/2 caffeine coffee in the mornings then all decaf for the rest of the day. Drinks Iced tea occasionally.   Exercise: None.   Home BP readings: Did not bring. Patient reports they are similar  to what we see in clinic.   Intolerances: Rosuvastatin, Atorvastatin, Hydralazine, Doxycycline, fentanyl, levofloxacin, midazolam  Labs: 09/01/20: Scr 1.12, eGFR 58, K 4.4   Wt Readings from Last 3 Encounters:  01/18/21 (!) 331 lb (150.1 kg)  01/05/21 (!) 332 lb (150.6 kg)  12/28/20 (!) 328 lb (148.8 kg)   BP Readings from Last 3 Encounters:  01/18/21 (!) 174/96  01/13/21 (!) 154/92  01/05/21 (!) 158/90   Pulse Readings from Last 3 Encounters:  01/18/21 95  01/13/21 95  01/05/21 86    Current Outpatient Medications  Medication Sig Dispense Refill   labetalol (NORMODYNE) 300 MG tablet Take 1.5 tablets (450 mg total) by mouth 2 (two) times daily. 270 tablet 3   acetaminophen (TYLENOL) 500 MG tablet Take 1,000 mg by mouth 3 (three) times daily as needed for moderate pain or headache.     amLODipine (NORVASC) 5 MG tablet Take 1 tablet (5 mg total) by mouth daily. 90 tablet 1   cetirizine (ZYRTEC ALLERGY) 10 MG tablet Take 1 tablet (10 mg total) by mouth daily. 90 tablet 1   Cholecalciferol (VITAMIN D3 MAXIMUM STRENGTH) 125 MCG (5000 UT) capsule Take 5,000 Units by mouth daily.     cloNIDine (CATAPRES) 0.1 MG tablet 2 (two) times daily.      divalproex (DEPAKOTE) 250 MG DR tablet Take 1 tablet (250 mg total) by mouth at bedtime. 30 tablet 2   esomeprazole (NEXIUM) 40 MG capsule TAKE ONE CAPSULE BY MOUTH TWICE DAILY 180 capsule 0   fenofibrate (  TRICOR) 145 MG tablet Take 1 tablet (145 mg total) by mouth daily. 90 tablet 3   fluticasone (FLONASE) 50 MCG/ACT nasal spray Place 2 sprays into both nostrils daily.     furosemide (LASIX) 40 MG tablet TAKE 1 TABLET(40 MG) BY MOUTH DAILY 90 tablet 3   gabapentin (NEURONTIN) 100 MG capsule Take 2 capsules (200 mg total) by mouth at bedtime. 180 capsule 3   levothyroxine (SYNTHROID) 75 MCG tablet Take 1 tablet (75 mcg total) by mouth daily. 90 tablet 0   lisinopril (ZESTRIL) 40 MG tablet Take 1 tablet (40 mg total) by mouth daily. 90 tablet 3    Lurasidone HCl 60 MG TABS Take 1 tablet (60 mg total) by mouth in the morning. 30 tablet 2   metFORMIN (GLUCOPHAGE) 500 MG tablet Take 1 tablet (500 mg total) by mouth daily with breakfast. 90 tablet 0   Polyethylene Glycol 3350 (MIRALAX PO) Take by mouth.     Potassium Chloride ER 20 MEQ TBCR TAKE 1 TABLET BY MOUTH EVERY DAY 90 tablet 2   TART CHERRY PO Take by mouth daily.     tirzepatide [facility]) 5 MG/0.5ML Pen Inject 5 mg into the skin once a week. 6 mL 0   No current facility-administered medications for this visit.    Allergies  Allergen Reactions   Other     Other reaction(s): Other (See Comments) Instructed not to take by Cardiology.   Fentanyl Hives   Levofloxacin Hives   Midazolam Hives   Pollen Extract     seasonal   Atorvastatin     Muscle pain in legs    Doxycycline Itching and Swelling    Pt reports she is not allergic to this and doesn't know why it says itching and swelling    Hydralazine Hcl Other (See Comments)    Hypercalcemia    Rosuvastatin     Abdominal pain    Past Medical History:  Diagnosis Date   Acute on chronic respiratory failure with hypoxia and hypercapnia ([place]) 09/15/2018   Anemia    Anxiety    Arrhythmia    tachycardia   Arthritis    Chickenpox    Chronic respiratory failure with hypoxia ([place]) 05/10/2018   Formatting of this note might be different from the original. Last Assessment & Plan:  Continue 1 L continuous on exertion and during sleep. On her next visit we will check OSA on CPAP/room air to decide whether she needs to take oxygen along with her on her cruise in May   Depression    Diverticulitis    GERD (gastroesophageal reflux disease)    Glaucoma    Hyperlipidemia    Hyperparathyroidism ([place])    Hypertension    Inflammatory polyps of colon ([place])    LVH (left ventricular hypertrophy)    Lymphedema    PCOS (polycystic ovarian syndrome)    Prediabetes    Recurrent UTI    Sleep apnea    CPAP   Thyroid disease    Vitamin  D deficiency     Last menstrual period 05/02/2017.  Plan: - Continue amlodipine 5m, clonidine 0.160mBID, lisinopril 4042m Increase labetalol 300m29m 1.5 tablets (450mg40mD - Counseled patient on the importance of a low-salt, heart-healthy diet. Explained to the patient to look at nutritional information at fast food restaurants and to chose more heart-healthy options that are lower in sodium.  - Counseled patient to take BP twice a day, along with HR, at least 3-4 times a  week and to bring readings and cuff with her to next appointment. Explained to call us with any questions or concerns.     Azucena Cecil, 4th-year Pharmacy Student [place]  I was with patient and student throughout entire visit and agree with above assessment and plan.   Tommy Medal PharmD CPP [facility] [address] [place], [place] [address] [phone]

## 2021-02-21 NOTE — Patient Instructions (Addendum)
Return for a a follow up appointment November 21 at 3 pm  Check your blood pressure at home daily and keep record of the readings.  Take your BP meds as follows:  Increase labetalol to 450 mg twice daily f(1.5 tablets twice daily)  Continue with all other medications.  Bring all of your meds, your BP cuff and your record of home blood pressures to your next appointment.  Exercise as you're able, try to walk approximately 30 minutes per day.  Keep salt intake to a minimum, especially watch canned and prepared boxed foods.  Eat more fresh fruits and vegetables and fewer canned items.  Avoid eating in fast food restaurants.    HOW TO TAKE YOUR BLOOD PRESSURE: Rest 5 minutes before taking your blood pressure.  Don't smoke or drink caffeinated beverages for at least 30 minutes before. Take your blood pressure before (not after) you eat. Sit comfortably with your back supported and both feet on the floor (don't cross your legs). Elevate your arm to heart level on a table or a desk. Use the proper sized cuff. It should fit smoothly and snugly around your bare upper arm. There should be enough room to slip a fingertip under the cuff. The bottom edge of the cuff should be 1 inch above the crease of the elbow. Ideally, take 3 measurements at one sitting and record the average.

## 2021-02-21 NOTE — Telephone Encounter (Signed)
See duplicate mychart message 

## 2021-02-25 ENCOUNTER — Encounter: Payer: Self-pay | Admitting: Pharmacist Clinician (PhC)/ Clinical Pharmacy Specialist

## 2021-03-01 ENCOUNTER — Ambulatory Visit (INDEPENDENT_AMBULATORY_CARE_PROVIDER_SITE_OTHER): Payer: BC Managed Care – PPO | Admitting: Family Medicine

## 2021-03-01 ENCOUNTER — Other Ambulatory Visit: Payer: Self-pay

## 2021-03-01 ENCOUNTER — Encounter (INDEPENDENT_AMBULATORY_CARE_PROVIDER_SITE_OTHER): Payer: Self-pay | Admitting: Family Medicine

## 2021-03-01 VITALS — BP 151/90 | HR 82 | Temp 98.4°F | Ht 63.0 in | Wt 332.0 lb

## 2021-03-01 DIAGNOSIS — R7303 Prediabetes: Secondary | ICD-10-CM | POA: Diagnosis not present

## 2021-03-01 DIAGNOSIS — Z6841 Body Mass Index (BMI) 40.0 and over, adult: Secondary | ICD-10-CM | POA: Diagnosis not present

## 2021-03-01 DIAGNOSIS — E038 Other specified hypothyroidism: Secondary | ICD-10-CM | POA: Diagnosis not present

## 2021-03-01 MED ORDER — LEVOTHYROXINE SODIUM 75 MCG PO TABS: 75.0000 ug | ORAL_TABLET | Freq: Every day | ORAL | 0 refills | Status: DC

## 2021-03-01 MED ORDER — TIRZEPATIDE 7.5 MG/0.5ML ~~LOC~~ SOAJ: 7.5000 mg | SUBCUTANEOUS | 0 refills | Status: DC

## 2021-03-01 MED ORDER — METFORMIN HCL 500 MG PO TABS
500.0000 mg | ORAL_TABLET | Freq: Every day | ORAL | 0 refills | Status: DC
Start: 2021-03-01 — End: 2021-05-30

## 2021-03-01 NOTE — Progress Notes (Signed)
Chief Complaint:   OBESITY Madison Palmer is here to discuss her progress with her obesity treatment plan along with follow-up of her obesity related diagnoses. Madison Palmer is on keeping a food journal and adhering to recommended goals of 1650-1800 calories and 125+ grams protein and states she is following her eating plan approximately 100% of the time. Madison Palmer states she is not currently exercising.  Today's visit was #: 86 Starting weight: 357 lbs Starting date: 02/27/2018 Today's weight: 332 lbs Today's date: 03/01/2021 Total lbs lost to date: 25 Total lbs lost since last in-office visit: 0  Interim History: [name] is noticing an increase in legs feeling achy. This is her first appt since 01/18/21. She is keeping up with her son. Pt is trying to get on a schedule/routine. She went to the pool but  couldn't get into the pool. Pt is trying to support her son in his last month before finals. She is starting to get periods of decreased appetite. She is very rarely eating more than 1600 cal/day. Pt was able to eat an entire 12 inch sub.  Subjective:   1. Other specified hypothyroidism Madison Palmer is on levothyroxine. Her last TSH was 2.34.  2. Pre-diabetes Madison Palmer is on Mounjaro 5 mg weekly. She is able to get all food in and average calorie intake per day is 1750-1775.  Assessment/Plan:   1. Other specified hypothyroidism Patient with long-standing hypothyroidism, on levothyroxine therapy. She appears euthyroid. Orders and follow up as documented in patient record.  Counseling Good thyroid control is important for overall health. Supratherapeutic thyroid levels are dangerous and will not improve weight loss results. The correct way to take levothyroxine is fasting, with water, separated by at least 30 minutes from breakfast, and separated by more than 4 hours from calcium, iron, multivitamins, acid reflux medications (PPIs).   Refill- levothyroxine (SYNTHROID) 75 MCG tablet; Take 1 tablet (75 mcg  total) by mouth daily.  Dispense: 90 tablet; Refill: 0  2. Pre-diabetes Eleesha will increase Mounjaro to 7.5 mg and continue to work on weight loss, exercise, and decreasing simple carbohydrates to help decrease the risk of diabetes.   Increase and Refill- tirzepatide (MOUNJARO) 7.5 MG/0.5ML Pen; Inject 7.5 mg into the skin once a week.  Dispense: 6 mL; Refill: 0 Refill- metFORMIN (GLUCOPHAGE) 500 MG tablet; Take 1 tablet (500 mg total) by mouth daily with breakfast.  Dispense: 90 tablet; Refill: 0  3. Obesity with current BMI 58.9  Madison Palmer is currently in the action stage of change. As such, her goal is to continue with weight loss efforts. She has agreed to keeping a food journal and adhering to recommended goals of 1650-1800 calories and 125+ grams protein.   Exercise goals:  Swimming twice a week.  Behavioral modification strategies: increasing lean protein intake, meal planning and cooking strategies, keeping healthy foods in the home, and planning for success.  Madison Palmer has agreed to follow-up with our clinic in 3 weeks. She was informed of the importance of frequent follow-up visits to maximize her success with intensive lifestyle modifications for her multiple health conditions.   Objective:   Blood pressure (!) 151/90, pulse 82, temperature 98.4 F (36.9 C), height 5\' 3"  (1.6 m), weight (!) 332 lb (150.6 kg), last menstrual period 05/02/2017, SpO2 95 %. Body mass index is 58.81 kg/m.  General: Cooperative, alert, well developed, in no acute distress. HEENT: Conjunctivae and lids unremarkable. Cardiovascular: Regular rhythm.  Lungs: Normal work of breathing. Neurologic: No focal deficits.   Lab  Results  Component Value Date   CREATININE 1.12 (H) 09/01/2020   BUN 24 09/01/2020   NA 142 09/01/2020   K 4.4 09/01/2020   CL 102 09/01/2020   CO2 24 09/01/2020   Lab Results  Component Value Date   ALT 11 06/10/2020   AST 12 06/10/2020   ALKPHOS 90 06/10/2020   BILITOT 0.3  06/10/2020   Lab Results  Component Value Date   HGBA1C 5.6 06/01/2020   HGBA1C 5.7 (H) 12/11/2019   HGBA1C 5.7 (H) 12/24/2018   HGBA1C 6.0 (H) 02/27/2018   HGBA1C 5.7 09/10/2017   Lab Results  Component Value Date   INSULIN 18.9 06/01/2020   INSULIN 11.4 12/11/2019   INSULIN 21.1 12/30/2018   INSULIN 9.6 02/27/2018   Lab Results  Component Value Date   TSH 2.340 09/01/2020   Lab Results  Component Value Date   CHOL 216 (H) 06/10/2020   HDL 50 06/10/2020   LDLCALC 138 (H) 06/10/2020   TRIG 154 (H) 06/10/2020   CHOLHDL 5.6 (H) 12/24/2018   Lab Results  Component Value Date   VD25OH 29.0 (L) 09/01/2020   VD25OH 19.7 (L) 06/01/2020   VD25OH 36.5 12/30/2018   Lab Results  Component Value Date   WBC 5.4 09/25/2018   HGB 12.7 09/25/2018   HCT 40.4 09/25/2018   MCV 94.6 09/25/2018   PLT 229 09/25/2018   Lab Results  Component Value Date   IRON 44 02/20/2018   TIBC 347 05/29/2017    Attestation Statements:   Reviewed by clinician on day of visit: allergies, medications, problem list, medical history, surgical history, family history, social history, and previous encounter notes.  Time spent on visit including pre-visit chart review and post-visit care and charting was 25 minutes.   Coral Ceo, CMA, am acting as transcriptionist for Coralie Common, MD.   I have reviewed the above documentation for accuracy and completeness, and I agree with the above. - Coralie Common, MD

## 2021-03-02 ENCOUNTER — Other Ambulatory Visit: Payer: Self-pay | Admitting: Cardiology

## 2021-03-14 ENCOUNTER — Encounter: Payer: Self-pay | Admitting: Nurse Practitioner

## 2021-03-16 ENCOUNTER — Ambulatory Visit (INDEPENDENT_AMBULATORY_CARE_PROVIDER_SITE_OTHER): Payer: BC Managed Care – PPO

## 2021-03-16 ENCOUNTER — Other Ambulatory Visit: Payer: Self-pay

## 2021-03-16 ENCOUNTER — Encounter: Payer: Self-pay | Admitting: Nurse Practitioner

## 2021-03-16 DIAGNOSIS — Z23 Encounter for immunization: Secondary | ICD-10-CM

## 2021-03-16 NOTE — Progress Notes (Signed)
Per the orders of NP Wilfred Lacy pt is here for flu vaccine high dose (Fluad), pt received vaccine in right deltoid at 15:25 pm given by [place] CMA/CPT pt tolerated vaccine well.

## 2021-03-21 ENCOUNTER — Ambulatory Visit (INDEPENDENT_AMBULATORY_CARE_PROVIDER_SITE_OTHER): Payer: BC Managed Care – PPO | Admitting: Pharmacist Clinician (PhC)/ Clinical Pharmacy Specialist

## 2021-03-21 ENCOUNTER — Other Ambulatory Visit: Payer: Self-pay

## 2021-03-21 DIAGNOSIS — I10 Essential (primary) hypertension: Secondary | ICD-10-CM | POA: Diagnosis not present

## 2021-03-21 MED ORDER — AMLODIPINE BESYLATE 10 MG PO TABS: 10.0000 mg | ORAL_TABLET | Freq: Every day | ORAL | 3 refills | Status: DC

## 2021-03-21 NOTE — Progress Notes (Signed)
03/21/2021 Madison Palmer [birth year] [id number]   HPI:  Madison Palmer is a 55 y.o. female patient of Dr Percival Spanish, who presents today for hypertension clinic evaluation.  In addition to hypertension, her medical history is significant for OSA, DM (A1c 5.6 on metformin and Mounjaro) and arrhythmias.  At her most recent visit BP was still elevated at 174/106.  She was counseled on sodium intake and encouraged to find other sources for meals besides [employer].  Labetalol was increased to 450 mg twice daily and she was encouraged to check home BP readings more frequently.    Today she returns for follow up.  She is up to the 7.5 mg of Mounjuro, took the first dose last week.  States caused some nausea and constipation the day after injection, but otherwise no problems.  She feels as though she is eating less, but hopes she can work up to a higher dose.  She has cut back on eating out - notes that last week she ate fast food only twice as opposed to usually 5 or more times in a week.   Blood Pressure Goal:  130/80  Current Medications: Amlodipine 5mg , Clonidine 0.1mg  BID, Furosemide 40mg , Labetalol 450mg  BID, Lisinopril 40mg    Family Hx:  adopted; no knowledge of family history.   Social Hx:  No tobacco, no alcohol.   Diet:  Predominately fast food (loves [employer]). Is eating 125g of protein/day per weight loss clinic; tracked with MyFitnessPal. Appetite is diminishing per patient but she makes the effort to meet her protein goal. Not eating a lot of fruits and veggies. Caloric intake set to 1600-1800 calories/day. Drinks 1/2 decaf 1/2 caffeine coffee in the mornings then all decaf for the rest of the day. Drinks Iced tea occasionally  Exercise: none  Home BP readings: home Omron arm cuff.  Diastolic read 14 points higher on forearm, but systolic within 10.  Reading on upper arm was > 10 points higher on both readings.  AM: 17 readings average 155/96 (HR 75)  range 137-184/81-116  PM:  13 readings average 174/100 (HR 76)  range 161-200/93-114  Intolerances: Rosuvastatin, Atorvastatin, Hydralazine, Doxycycline, fentanyl, levofloxacin, midazolam  Labs: 5/22:  Na 142, K 4.4, Glu 111, BUN 24, SCr 1.12 GFR 58   Wt Readings from Last 3 Encounters:  03/01/21 (!) 332 lb (150.6 kg)  01/18/21 (!) 331 lb (150.1 kg)  01/07/21 (!) 328 lb (148.8 kg)   BP Readings from Last 3 Encounters:  03/21/21 (!) 161/84  03/01/21 (!) 151/90  02/21/21 (!) 174/106   Pulse Readings from Last 3 Encounters:  03/21/21 77  03/01/21 82  02/21/21 86    Current Outpatient Medications  Medication Sig Dispense Refill   acetaminophen (TYLENOL) 500 MG tablet Take 1,000 mg by mouth 3 (three) times daily as needed for moderate pain or headache.     amLODipine (NORVASC) 10 MG tablet Take 1 tablet (10 mg total) by mouth daily. 90 tablet 3   cetirizine (ZYRTEC ALLERGY) 10 MG tablet Take 1 tablet (10 mg total) by mouth daily. 90 tablet 1   Cholecalciferol (VITAMIN D3 MAXIMUM STRENGTH) 125 MCG (5000 UT) capsule Take 5,000 Units by mouth daily.     cloNIDine (CATAPRES) 0.1 MG tablet 2 (two) times daily.      divalproex (DEPAKOTE) 250 MG DR tablet Take 1 tablet (250 mg total) by mouth at bedtime. 30 tablet 2   esomeprazole (NEXIUM) 40 MG capsule TAKE ONE CAPSULE BY MOUTH TWICE DAILY 180  capsule 0   fenofibrate (TRICOR) 145 MG tablet Take 1 tablet (145 mg total) by mouth daily. 90 tablet 3   fluticasone (FLONASE) 50 MCG/ACT nasal spray Place 2 sprays into both nostrils daily.     furosemide (LASIX) 40 MG tablet TAKE 1 TABLET(40 MG) BY MOUTH DAILY 90 tablet 3   gabapentin (NEURONTIN) 100 MG capsule Take 2 capsules (200 mg total) by mouth at bedtime. 180 capsule 3   labetalol (NORMODYNE) 300 MG tablet Take 1.5 tablets (450 mg total) by mouth 2 (two) times daily. 270 tablet 3   levothyroxine (SYNTHROID) 75 MCG tablet Take 1 tablet (75 mcg total) by mouth daily. 90 tablet 0   lisinopril (ZESTRIL) 40 MG tablet Take  1 tablet (40 mg total) by mouth daily. 90 tablet 3   Lurasidone HCl 60 MG TABS Take 1 tablet (60 mg total) by mouth in the morning. 30 tablet 2   metFORMIN (GLUCOPHAGE) 500 MG tablet Take 1 tablet (500 mg total) by mouth daily with breakfast. 90 tablet 0   Polyethylene Glycol 3350 (MIRALAX PO) Take by mouth.     Potassium Chloride ER 20 MEQ TBCR TAKE 1 TABLET BY MOUTH EVERY DAY 90 tablet 2   TART CHERRY PO Take by mouth daily.     tirzepatide (MOUNJARO) 7.5 MG/0.5ML Pen Inject 7.5 mg into the skin once a week. 6 mL 0   No current facility-administered medications for this visit.    Allergies  Allergen Reactions   Other     Other reaction(s): Other (See Comments) Instructed not to take by Cardiology.   Fentanyl Hives   Levofloxacin Hives   Midazolam Hives   Pollen Extract     seasonal   Atorvastatin     Muscle pain in legs    Doxycycline Itching and Swelling    Pt reports she is not allergic to this and doesn't know why it says itching and swelling    Hydralazine Hcl Other (See Comments)    Hypercalcemia    Rosuvastatin     Abdominal pain    Past Medical History:  Diagnosis Date   Acute on chronic respiratory failure with hypoxia and hypercapnia ([place]) 09/15/2018   Anemia    Anxiety    Arrhythmia    tachycardia   Arthritis    Chickenpox    Chronic respiratory failure with hypoxia ([place]) 05/10/2018   Formatting of this note might be different from the original. Last Assessment & Plan:  Continue 1 L continuous on exertion and during sleep. On her next visit we will check OSA on CPAP/room air to decide whether she needs to take oxygen along with her on her cruise in May   Depression    Diverticulitis    GERD (gastroesophageal reflux disease)    Glaucoma    Hyperlipidemia    Hyperparathyroidism ([place])    Hypertension    Inflammatory polyps of colon ([place])    LVH (left ventricular hypertrophy)    Lymphedema    PCOS (polycystic ovarian syndrome)    Prediabetes    Recurrent  UTI    Sleep apnea    CPAP   Thyroid disease    Vitamin D deficiency     Blood pressure (!) 161/84, pulse 77, resp. rate 18, height 5\' 4"  (1.626 m), last menstrual period 05/02/2017, SpO2 97 %.  Essential hypertension Patient with poorly controlled hypertension on 4 medications, however, no good diuretic in the mix.  Discussed the need to get her on chlorthalidone, probably at  next visit.  Today will increase the amlodipine to 10 mg, and move it to evenings to better balance meds.  Reviewed home BP technique, and her cuff is more accurate on forearm, so she will check it there from now on.  Praised patient on cutting back fast foods and encouraged her to keep with this trend of fast food only 2 days per week for now.  Would like to see none, but will work slowly with her to avoid seeing her go back to 5+ times/week.  She will continue with home monitoring and we will see her back in the office in 1 month for follow up.     Tommy Medal PharmD CPP [facility] [address] [place], [place] [address] [phone]

## 2021-03-21 NOTE — Assessment & Plan Note (Signed)
Patient with poorly controlled hypertension on 4 medications, however, no good diuretic in the mix.  Discussed the need to get her on chlorthalidone, probably at next visit.  Today will increase the amlodipine to 10 mg, and move it to evenings to better balance meds.  Reviewed home BP technique, and her cuff is more accurate on forearm, so she will check it there from now on.  Praised patient on cutting back fast foods and encouraged her to keep with this trend of fast food only 2 days per week for now.  Would like to see none, but will work slowly with her to avoid seeing her go back to 5+ times/week.  She will continue with home monitoring and we will see her back in the office in 1 month for follow up.

## 2021-03-21 NOTE — Progress Notes (Signed)
[address] [place] Phone: [phone] Subjective:   Fontaine No, am serving as a scribe for Dr. Hulan Saas.  This visit occurred during the SARS-CoV-2 public health emergency.  Safety protocols were in place, including screening questions prior to the visit, additional usage of staff PPE, and extensive cleaning of exam room while observing appropriate contact time as indicated for disinfecting solutions.    I'm seeing this patient by the request  of:  [name], Charlene Brooke, NP  CC: Neck pain follow-up  [id number]  01/05/2021 Patient is making significant progress at this time.  I am impressed with how much improvement with the epidural at 60%.  We discussed different treatment options in great detail including the possibility of continuing with formal physical therapy, repeating epidural or the possibility of surgical intervention.  Patient would like to try another epidural to see if this would improve the pain even more.  Continue the conservative therapy as well as the gabapentin.  Follow-up with me again in 4 to 6 weeks otherwise.  Total time reviewing patient's imaging including MRIs as well as previous notes and treatments greater than 32 minutes  Updated 03/22/2021 RUKIYA HODGKINS is a 55 y.o. female coming in with complaint of neck pain.  Known degenerative disc disease on MRI.  Has had an epidural on August 8 as well as a repeat on September 15.  Patient feels epidurals are helping. Patient was to continue conservative therapy otherwise.  Patient states that she is doing better. Notes stiffness in B traps in the morning.     Past Medical History:  Diagnosis Date   Acute on chronic respiratory failure with hypoxia and hypercapnia (HCC) 09/15/2018   Anemia    Anxiety    Arrhythmia    tachycardia   Arthritis    Chickenpox    Chronic respiratory failure with hypoxia ([place]) 05/10/2018   Formatting of this note  might be different from the original. Last Assessment & Plan:  Continue 1 L continuous on exertion and during sleep. On her next visit we will check OSA on CPAP/room air to decide whether she needs to take oxygen along with her on her cruise in May   Depression    Diverticulitis    GERD (gastroesophageal reflux disease)    Glaucoma    Hyperlipidemia    Hyperparathyroidism ([place])    Hypertension    Inflammatory polyps of colon ([place])    LVH (left ventricular hypertrophy)    Lymphedema    PCOS (polycystic ovarian syndrome)    Prediabetes    Recurrent UTI    Sleep apnea    CPAP   Thyroid disease    Vitamin D deficiency    Past Surgical History:  Procedure Laterality Date   BREAST BIOPSY  2015   CESAREAN SECTION  2004   INNER EAR SURGERY     ear and sinus surgery   LAPAROSCOPIC REPAIR AND REMOVAL OF GASTRIC BAND     OOPHORECTOMY Left    TONSILLECTOMY AND ADENOIDECTOMY     Social History   Socioeconomic History   Marital status: Married    Spouse name: Not on file   Number of children: 1   Years of education: Not on file   Highest education level: Not on file  Occupational History   Occupation: Unemployed  Tobacco Use   Smoking status: Never   Smokeless tobacco: Never  Vaping Use   Vaping Use: Never used  Substance and Sexual Activity   Alcohol use: Yes    Comment: social   Drug use: No   Sexual activity: Not Currently  Other Topics Concern   Not on file  Social History Narrative   Lives with son    Currently separated from spouse   Left handed   Caffeine: coffee daily: half-caff 3 cups/day and decaf maybe 3-5 cups/day, occasional mtn dew, tea 1-2 times per week   Social Determinants of Health   Financial Resource Strain: Not on file  Food Insecurity: Not on file  Transportation Needs: Not on file  Physical Activity: Not on file  Stress: Not on file  Social Connections: Not on file   Allergies  Allergen Reactions   Other     Other reaction(s): Other (See  Comments) Instructed not to take by Cardiology.   Fentanyl Hives   Levofloxacin Hives   Midazolam Hives   Pollen Extract     seasonal   Atorvastatin     Muscle pain in legs    Doxycycline Itching and Swelling    Pt reports she is not allergic to this and doesn't know why it says itching and swelling    Hydralazine Hcl Other (See Comments)    Hypercalcemia    Rosuvastatin     Abdominal pain   Family History  Adopted: Yes  Problem Relation Age of Onset   Hearing loss Son        right    Healthy Son     Current Outpatient Medications (Endocrine & Metabolic):    levothyroxine (SYNTHROID) 75 MCG tablet, Take 1 tablet (75 mcg total) by mouth daily.   metFORMIN (GLUCOPHAGE) 500 MG tablet, Take 1 tablet (500 mg total) by mouth daily with breakfast.   tirzepatide (MOUNJARO) 7.5 MG/0.5ML Pen, Inject 7.5 mg into the skin once a week.  Current Outpatient Medications (Cardiovascular):    amLODipine (NORVASC) 10 MG tablet, Take 1 tablet (10 mg total) by mouth daily.   cloNIDine (CATAPRES) 0.1 MG tablet, 2 (two) times daily.    fenofibrate (TRICOR) 145 MG tablet, Take 1 tablet (145 mg total) by mouth daily.   furosemide (LASIX) 40 MG tablet, TAKE 1 TABLET(40 MG) BY MOUTH DAILY   labetalol (NORMODYNE) 300 MG tablet, Take 1.5 tablets (450 mg total) by mouth 2 (two) times daily.   lisinopril (ZESTRIL) 40 MG tablet, Take 1 tablet (40 mg total) by mouth daily.  Current Outpatient Medications (Respiratory):    cetirizine (ZYRTEC ALLERGY) 10 MG tablet, Take 1 tablet (10 mg total) by mouth daily.   fluticasone (FLONASE) 50 MCG/ACT nasal spray, Place 2 sprays into both nostrils daily.  Current Outpatient Medications (Analgesics):    acetaminophen (TYLENOL) 500 MG tablet, Take 1,000 mg by mouth 3 (three) times daily as needed for moderate pain or headache.   Current Outpatient Medications (Other):    Cholecalciferol (VITAMIN D3 MAXIMUM STRENGTH) 125 MCG (5000 UT) capsule, Take 5,000 Units by  mouth daily.   divalproex (DEPAKOTE) 250 MG DR tablet, Take 1 tablet (250 mg total) by mouth at bedtime.   esomeprazole (NEXIUM) 40 MG capsule, TAKE ONE CAPSULE BY MOUTH TWICE DAILY   gabapentin (NEURONTIN) 100 MG capsule, Take 2 capsules (200 mg total) by mouth at bedtime.   Lurasidone HCl 60 MG TABS, Take 1 tablet (60 mg total) by mouth in the morning.   Polyethylene Glycol 3350 (MIRALAX PO), Take by mouth.   Potassium Chloride ER 20 MEQ TBCR, TAKE 1 TABLET BY MOUTH EVERY DAY  TART CHERRY PO, Take by mouth daily.     Objective  Blood pressure (!) 142/86, pulse 85, height 5\' 4"  (1.626 m), weight (!) 334 lb (151.5 kg), last menstrual period 05/02/2017, SpO2 95 %.   General: No apparent distress alert and oriented x3 mood and affect normal, dressed appropriately.  Obese HEENT: Pupils equal, extraocular movements intact  Respiratory: Patient's speak in full sentences and does not appear short of breath  Cardiovascular: No lower extremity edema, non tender, no erythema  Gait antalgic Neck exam does show some mild loss lordosis but negative Spurling's.  Improvement in sidebending and rotation noted.  5-5 strength of the upper extremities.    Impression and Recommendations:     The above documentation has been reviewed and is accurate and complete Lyndal Pulley, DO

## 2021-03-21 NOTE — Patient Instructions (Signed)
Return for a a follow up appointment Tuesday Dec 20 at 3 pm  Check your blood pressure at home daily and keep record of the readings.  Take your BP meds as follows:  Take amlodipine 5 mg Tuesday evening, then Wednesday start with amlodipine 10 mg each night.    Continue with all other medications.    Bring all of your meds, your BP cuff and your record of home blood pressures to your next appointment.  Exercise as you're able, try to walk approximately 30 minutes per day.  Keep salt intake to a minimum, especially watch canned and prepared boxed foods.  Eat more fresh fruits and vegetables and fewer canned items.  Avoid eating in fast food restaurants.    HOW TO TAKE YOUR BLOOD PRESSURE: Rest 5 minutes before taking your blood pressure.  Don't smoke or drink caffeinated beverages for at least 30 minutes before. Take your blood pressure before (not after) you eat. Sit comfortably with your back supported and both feet on the floor (don't cross your legs). Elevate your arm to heart level on a table or a desk. Use the proper sized cuff. It should fit smoothly and snugly around your bare upper arm. There should be enough room to slip a fingertip under the cuff. The bottom edge of the cuff should be 1 inch above the crease of the elbow. Ideally, take 3 measurements at one sitting and record the average.

## 2021-03-22 ENCOUNTER — Ambulatory Visit (INDEPENDENT_AMBULATORY_CARE_PROVIDER_SITE_OTHER): Payer: BC Managed Care – PPO | Admitting: Family Medicine

## 2021-03-22 ENCOUNTER — Encounter: Payer: Self-pay | Admitting: Family Medicine

## 2021-03-22 ENCOUNTER — Encounter (INDEPENDENT_AMBULATORY_CARE_PROVIDER_SITE_OTHER): Payer: Self-pay | Admitting: Family Medicine

## 2021-03-22 ENCOUNTER — Other Ambulatory Visit: Payer: Self-pay

## 2021-03-22 VITALS — BP 142/86 | HR 83 | Temp 98.1°F | Ht 64.0 in | Wt 330.0 lb

## 2021-03-22 DIAGNOSIS — M5010 Cervical disc disorder with radiculopathy, unspecified cervical region: Secondary | ICD-10-CM | POA: Diagnosis not present

## 2021-03-22 DIAGNOSIS — Z6841 Body Mass Index (BMI) 40.0 and over, adult: Secondary | ICD-10-CM

## 2021-03-22 DIAGNOSIS — R7303 Prediabetes: Secondary | ICD-10-CM | POA: Diagnosis not present

## 2021-03-22 DIAGNOSIS — I10 Essential (primary) hypertension: Secondary | ICD-10-CM | POA: Diagnosis not present

## 2021-03-22 NOTE — Patient Instructions (Signed)
Doing great Stay active Write if you need another epidural See [place] when you need [place]

## 2021-03-22 NOTE — Assessment & Plan Note (Signed)
Patient is significantly better at this time.  Discussed icing regimen and home exercises, discussed which activities to do which wants to avoid.  Increase activity slowly.  Discussed avoiding certain activities at this moment but otherwise can follow-up with me as needed.

## 2021-03-22 NOTE — Progress Notes (Signed)
Chief Complaint:   OBESITY Madison Palmer is here to discuss her progress with her obesity treatment plan along with follow-up of her obesity related diagnoses. Madison Palmer is on keeping a food journal and adhering to recommended goals of 1650-180 calories and 125+ grams protein and states she is following her eating plan approximately 90% of the time. Madison Palmer states she is not currently exercising.  Today's visit was #: 58 Starting weight: 357 lbs Starting date: 02/27/2018 Today's weight: 330 lbs Today's date: 03/22/2021 Total lbs lost to date: 27 Total lbs lost since last in-office visit: 2  Interim History: Madison Palmer has been keeping busy- son got a car, got work done on her house and is having to move more to keep up with things going on in her life. She is cooking with her son for Thanksgiving. 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 She is noticing more control of appetite with Mounjaro. She is getting in about 1750 calories per day. Protein wise, she is getting about 125 grams or more per day. Pt has not been able to get to the pool due to how busy her schedule has been.  Subjective:   1. Essential hypertension BP better controlled today. Wave is on labetalol, amlodipine, lisinopril, and clonidine.  2. Pre-diabetes Madison Palmer is on Mounjaro 7.5 mg weekly. She may be noticing some change in appetite and better control.  Assessment/Plan:   1. Essential hypertension Donnelle is working on healthy weight loss and exercise to improve blood pressure control. We will watch for signs of hypotension as she continues her lifestyle modifications. Follow up with cardiology pharmD.  2. Pre-diabetes Madison Palmer will continue to work on weight loss, exercise, and decreasing simple carbohydrates to help decrease the risk of diabetes. Continue Mounjaro with no change in dose yet. Evaluate for dose change at next appt.  3. Obesity with current BMI 58.5  Madison Palmer is currently in the action  stage of change. As such, her goal is to continue with weight loss efforts. She has agreed to keeping a food journal and adhering to recommended goals of 1650-1800 calories and 125+ grams protein.   Exercise goals: All adults should avoid inactivity. Some physical activity is better than none, and adults who participate in any amount of physical activity gain some health benefits.  Behavioral modification strategies: increasing lean protein intake, meal planning and cooking strategies, and holiday eating strategies .  Madison Palmer has agreed to follow-up with our clinic in 3 weeks. She was informed of the importance of frequent follow-up visits to maximize her success with intensive lifestyle modifications for her multiple health conditions.   Objective:   Blood pressure (!) 142/86, pulse 83, temperature 98.1 F (36.7 C), height 5\' 4"  (1.626 m), weight (!) 330 lb (149.7 kg), last menstrual period 05/02/2017, SpO2 97 %. Body mass index is 56.64 kg/m.  General: Cooperative, alert, well developed, in no acute distress. HEENT: Conjunctivae and lids unremarkable. Cardiovascular: Regular rhythm.  Lungs: Normal work of breathing. Neurologic: No focal deficits.   Lab Results  Component Value Date   CREATININE 1.12 (H) 09/01/2020   BUN 24 09/01/2020   NA 142 09/01/2020   K 4.4 09/01/2020   CL 102 09/01/2020   CO2 24 09/01/2020   Lab Results  Component Value Date   ALT 11 06/10/2020   AST 12 06/10/2020   ALKPHOS 90 06/10/2020   BILITOT 0.3 06/10/2020   Lab Results  Component Value Date   HGBA1C 5.6 06/01/2020  HGBA1C 5.7 (H) 12/11/2019   HGBA1C 5.7 (H) 12/24/2018   HGBA1C 6.0 (H) 02/27/2018   HGBA1C 5.7 09/10/2017   Lab Results  Component Value Date   INSULIN 18.9 06/01/2020   INSULIN 11.4 12/11/2019   INSULIN 21.1 12/30/2018   INSULIN 9.6 02/27/2018   Lab Results  Component Value Date   TSH 2.340 09/01/2020   Lab Results  Component Value Date   CHOL 216 (H) 06/10/2020    HDL 50 06/10/2020   LDLCALC 138 (H) 06/10/2020   TRIG 154 (H) 06/10/2020   CHOLHDL 5.6 (H) 12/24/2018   Lab Results  Component Value Date   VD25OH 29.0 (L) 09/01/2020   VD25OH 19.7 (L) 06/01/2020   VD25OH 36.5 12/30/2018   Lab Results  Component Value Date   WBC 5.4 09/25/2018   HGB 12.7 09/25/2018   HCT 40.4 09/25/2018   MCV 94.6 09/25/2018   PLT 229 09/25/2018   Lab Results  Component Value Date   IRON 44 02/20/2018   TIBC 347 05/29/2017   Attestation Statements:   Reviewed by clinician on day of visit: allergies, medications, problem list, medical history, surgical history, family history, social history, and previous encounter notes.  Coral Ceo, CMA, am acting as transcriptionist for Coralie Common, MD.   I have reviewed the above documentation for accuracy and completeness, and I agree with the above. - Coralie Common, MD

## 2021-04-05 ENCOUNTER — Encounter (HOSPITAL_COMMUNITY): Payer: Self-pay | Admitting: Psychiatry

## 2021-04-05 ENCOUNTER — Other Ambulatory Visit: Payer: Self-pay

## 2021-04-05 ENCOUNTER — Telehealth (HOSPITAL_BASED_OUTPATIENT_CLINIC_OR_DEPARTMENT_OTHER): Payer: BC Managed Care – PPO | Admitting: Psychiatry

## 2021-04-05 DIAGNOSIS — F319 Bipolar disorder, unspecified: Secondary | ICD-10-CM | POA: Diagnosis not present

## 2021-04-05 DIAGNOSIS — F419 Anxiety disorder, unspecified: Secondary | ICD-10-CM

## 2021-04-05 MED ORDER — DIVALPROEX SODIUM 250 MG PO DR TAB: 250.0000 mg | DELAYED_RELEASE_TABLET | Freq: Every day | ORAL | 2 refills | Status: DC

## 2021-04-05 MED ORDER — LURASIDONE HCL 60 MG PO TABS
60.0000 mg | ORAL_TABLET | Freq: Every morning | ORAL | 2 refills | Status: DC
Start: 2021-04-05 — End: 2021-05-10

## 2021-04-05 NOTE — Progress Notes (Signed)
Virtual Visit via Telephone Note  I connected with Madison Palmer on 04/05/21 at 10:20 AM EST by telephone and verified that I am speaking with the correct person using two identifiers.  Location: Patient: Home Provider: Home Office   I discussed the limitations, risks, security and privacy concerns of performing an evaluation and management service by telephone and the availability of in person appointments. I also discussed with the patient that there may be a patient responsible charge related to this service. The patient expressed understanding and agreed to proceed.   History of Present Illness: Patient is evaluated by phone session.  She is taking Latuda and Depakote.  She noticed her paranoia, mania and anxiety is under control.  However she feels sometimes lack of motivation to do things.  She is not sure why she feels tired but denies any crying spells, anhedonia, feeling of hopelessness, suicidal thoughts.  Her living situation is good.  She is happy that son is now have a license and he is more independent.  She is in therapy once a week that helps coping skills.  She is with the weight management program and hoping to see some results in coming weeks.  She denies any paranoia, hallucination or any anger.  Her plan is to visit [place] and then [place] from Christmas.  Her friend is getting surgery in [place] and she wants to be with a friend.  Patient admitted to occasionally she has tremors but it does not interfere in her daily life.  She does not want to change the medication since it is working well.  Her appetite is okay.  Patient denies any panic attack.  Past psychiatric history; H/O depression and inpatient at [facility] in May 20202 for delusion, psychosis and violent behavior. D/C on Depakote 500 mg BID, Latuda 60 mg daily and Trilafon 12 mg a day. No h/o suicidal attempt, nightmares, panic attack or OCD symptoms.  No h/o drinking and drug use.     Psychiatric Specialty Exam: Physical Exam  Review of Systems  Weight (!) 334 lb (151.5 kg), last menstrual period 05/02/2017.There is no height or weight on file to calculate BMI.  General Appearance: NA  Eye Contact:  NA  Speech:  Clear and Coherent and Normal Rate  Volume:  Normal  Mood:  Euthymic  Affect:  NA  Thought Process:  Goal Directed  Orientation:  Full (Time, Place, and Person)  Thought Content:  Logical  Suicidal Thoughts:  No  Homicidal Thoughts:  No  Memory:  Immediate;   Good Recent;   Good Remote;   Good  Judgement:  Intact  Insight:  Present  Psychomotor Activity:  NA  Concentration:  Concentration: Good and Attention Span: Good  Recall:  Good  Fund of Knowledge:  Good  Language:  Good  Akathisia:  No  Handed:  Right  AIMS (if indicated):     Assets:  Communication Skills Desire for Improvement Housing Transportation  ADL's:  Intact  Cognition:  WNL  Sleep:   good      Assessment and Plan: Bipolar disorder type I.  Anxiety.  Discuss to have blood work including TSH, vitamin D level, CBC and CMP as patient noticed some time fatigue and tired but denies any depressive symptoms.  I encourage that she should walk 10 to 15 minutes 3-4 times in a week.  Her physician also recommended to do water aerobics as patient has sometimes knee pain.  We talk about occasional tremors  but patient is not interested to switch or decrease the dose of the medication.  I encouraged to continue therapy once a week to help with coping skills.  Continue Latuda 60 mg daily and Depakote 250 mg at bedtime.  Recommended to call us back if there is any question or any concern.  Follow-up in 3 months.  Follow Up Instructions:    I discussed the assessment and treatment plan with the patient. The patient was provided an opportunity to ask questions and all were answered. The patient agreed with the plan and demonstrated an understanding of the instructions.   The patient was  advised to call back or seek an in-person evaluation if the symptoms worsen or if the condition fails to improve as anticipated.  I provided 19 minutes of non-face-to-face time during this encounter.   Kathlee Nations, MD

## 2021-04-07 ENCOUNTER — Other Ambulatory Visit (HOSPITAL_COMMUNITY): Payer: Self-pay | Admitting: Psychiatry

## 2021-04-07 DIAGNOSIS — F319 Bipolar disorder, unspecified: Secondary | ICD-10-CM

## 2021-04-07 DIAGNOSIS — F419 Anxiety disorder, unspecified: Secondary | ICD-10-CM

## 2021-04-08 ENCOUNTER — Ambulatory Visit: Payer: BC Managed Care – PPO | Admitting: Nurse Practitioner

## 2021-04-08 ENCOUNTER — Encounter: Payer: Self-pay | Admitting: Nurse Practitioner

## 2021-04-08 ENCOUNTER — Other Ambulatory Visit: Payer: Self-pay

## 2021-04-08 VITALS — BP 156/84 | HR 100 | Temp 96.0°F | Wt 333.0 lb

## 2021-04-08 DIAGNOSIS — E782 Mixed hyperlipidemia: Secondary | ICD-10-CM

## 2021-04-08 DIAGNOSIS — E038 Other specified hypothyroidism: Secondary | ICD-10-CM

## 2021-04-08 DIAGNOSIS — E1169 Type 2 diabetes mellitus with other specified complication: Secondary | ICD-10-CM | POA: Diagnosis not present

## 2021-04-08 DIAGNOSIS — I10 Essential (primary) hypertension: Secondary | ICD-10-CM | POA: Diagnosis not present

## 2021-04-08 NOTE — Patient Instructions (Addendum)
Go to lab for blood draw Maintain current medication doses Maintain upcoming appt with cardiology for HTN management. Please sign medical release to get records from ophthalmology.

## 2021-04-08 NOTE — Progress Notes (Signed)
Subjective:  Patient ID: Madison Palmer, female    DOB: 02/14/1966  Age: 55 y.o. MRN: [id number]  CC: Follow-up (F/u on HTN and prediabetics. Pt checks BP at home and states they run higher in the evenings. Days 130s/80s Nights 160s/90s)  HPI  Essential hypertension BP not at goal Not always compliant with DASH diet Compliant with medications and CPAP Current [place] of clonidine, amlodipine, lisinopril. And labetalol. Managed by cardiology at this time. reports home BP 130s/80s-150s/90s BP Readings from Last 3 Encounters:  04/08/21 (!) 156/84  03/22/21 (!) 142/86  03/22/21 (!) 142/86   Maintain med doses, f/up appt with cardiology and DASH diet  DM (diabetes mellitus) (Madison Palmer) Controlled with metformin and mounjaro injection. Up to date with eye exam: request report Normal foot exam Repeat hgbA1c  Mixed hyperlipidemia LDL not at goal with fenofibrate Unable to tolerate statins Repeat lipid panel  Wt Readings from Last 3 Encounters:  04/08/21 (!) 333 lb (151 kg)  03/22/21 (!) 334 lb (151.5 kg)  03/22/21 (!) 330 lb (149.7 kg)     Reviewed past Medical, Social and Family history today.  Outpatient Medications Prior to Visit  Medication Sig Dispense Refill   acetaminophen (TYLENOL) 500 MG tablet Take 1,000 mg by mouth 3 (three) times daily as needed for moderate pain or headache.     amLODipine (NORVASC) 10 MG tablet Take 1 tablet (10 mg total) by mouth daily. 90 tablet 3   cetirizine (ZYRTEC ALLERGY) 10 MG tablet Take 1 tablet (10 mg total) by mouth daily. 90 tablet 1   Cholecalciferol (VITAMIN D3 MAXIMUM STRENGTH) 125 MCG (5000 UT) capsule Take 5,000 Units by mouth daily.     cloNIDine (CATAPRES) 0.1 MG tablet 2 (two) times daily.      divalproex (DEPAKOTE) 250 MG DR tablet Take 1 tablet (250 mg total) by mouth at bedtime. 30 tablet 2   esomeprazole (NEXIUM) 40 MG capsule TAKE ONE CAPSULE BY MOUTH TWICE DAILY 180 capsule 0   fenofibrate (TRICOR) 145 MG tablet Take 1 tablet  (145 mg total) by mouth daily. 90 tablet 3   fluticasone (FLONASE) 50 MCG/ACT nasal spray Place 2 sprays into both nostrils daily.     furosemide (LASIX) 40 MG tablet TAKE 1 TABLET(40 MG) BY MOUTH DAILY 90 tablet 3   gabapentin (NEURONTIN) 100 MG capsule Take 2 capsules (200 mg total) by mouth at bedtime. 180 capsule 3   labetalol (NORMODYNE) 300 MG tablet Take 1.5 tablets (450 mg total) by mouth 2 (two) times daily. 270 tablet 3   levothyroxine (SYNTHROID) 75 MCG tablet Take 1 tablet (75 mcg total) by mouth daily. 90 tablet 0   lisinopril (ZESTRIL) 40 MG tablet Take 1 tablet (40 mg total) by mouth daily. 90 tablet 3   Lurasidone HCl 60 MG TABS Take 1 tablet (60 mg total) by mouth in the morning. 30 tablet 2   metFORMIN (GLUCOPHAGE) 500 MG tablet Take 1 tablet (500 mg total) by mouth daily with breakfast. 90 tablet 0   Polyethylene Glycol 3350 (MIRALAX PO) Take by mouth.     Potassium Chloride ER 20 MEQ TBCR TAKE 1 TABLET BY MOUTH EVERY DAY 90 tablet 2   TART CHERRY PO Take by mouth daily.     tirzepatide (MOUNJARO) 7.5 MG/0.5ML Pen Inject 7.5 mg into the skin once a week. 6 mL 0   No facility-administered medications prior to visit.    ROS See HPI  Objective:  BP (!) 156/84 (BP Location: Left Arm,  Patient Position: Sitting, Cuff Size: Large)   Pulse 100   Temp (!) 96 F (35.6 C) (Temporal)   Wt (!) 333 lb (151 kg)   LMP 05/02/2017   SpO2 96%   BMI 57.16 kg/m   Physical Exam Vitals reviewed.  Cardiovascular:     Rate and Rhythm: Normal rate and regular rhythm.     Pulses: Normal pulses.          Dorsalis pedis pulses are 2+ on the right side and 2+ on the left side.       Posterior tibial pulses are 2+ on the right side and 2+ on the left side.     Heart sounds: Normal heart sounds.  Pulmonary:     Effort: Pulmonary effort is normal.     Breath sounds: Normal breath sounds.  Musculoskeletal:     Right lower leg: Edema present.     Left lower leg: Edema present.     Right  foot: Normal range of motion.     Left foot: Normal range of motion.  Feet:     Right foot:     Protective Sensation: 6 sites tested.  6 sites sensed.     Skin integrity: Skin integrity normal.     Toenail Condition: Right toenails are normal.     Left foot:     Protective Sensation: 6 sites tested.  6 sites sensed.     Skin integrity: Skin integrity normal.     Toenail Condition: Left toenails are normal.  Neurological:     Mental Status: She is alert and oriented to person, place, and time.  Psychiatric:        Mood and Affect: Mood normal.        Behavior: Behavior normal.        Thought Content: Thought content normal.   Assessment & Plan:  This visit occurred during the SARS-CoV-2 public health emergency.  Safety protocols were in place, including screening questions prior to the visit, additional usage of staff PPE, and extensive cleaning of exam room while observing appropriate contact time as indicated for disinfecting solutions.   Madison Palmer was seen today for follow-up.  Diagnoses and all orders for this visit:  Type 2 diabetes mellitus with other specified complication, without long-term current use of insulin (Madison Palmer) -     Cancel: Hemoglobin A1c; Future -     Cancel: Hepatic function panel; Future -     Hemoglobin A1c; Future -     Hepatic function panel; Future  Essential hypertension  Mixed hyperlipidemia -     Cancel: Lipid panel; Future -     Cancel: Hepatic function panel; Future -     Lipid panel; Future -     Hepatic function panel; Future   Problem List Items Addressed This Visit       Cardiovascular and Mediastinum   Essential hypertension    BP not at goal Not always compliant with DASH diet Compliant with medications and CPAP Current [place] of clonidine, amlodipine, lisinopril. And labetalol. Managed by cardiology at this time. reports home BP 130s/80s-150s/90s BP Readings from Last 3 Encounters:  04/08/21 (!) 156/84  03/22/21 (!) 142/86  03/22/21  (!) 142/86   Maintain med doses, f/up appt with cardiology and DASH diet        Endocrine   DM (diabetes mellitus) (Madison Palmer) - Primary    Controlled with metformin and mounjaro injection. Up to date with eye exam: request report Normal foot exam Repeat hgbA1c  Relevant Orders   Hemoglobin A1c   Hepatic function panel     Other   Mixed hyperlipidemia    LDL not at goal with fenofibrate Unable to tolerate statins Repeat lipid panel      Relevant Orders   Lipid panel   Hepatic function panel    Follow-up: Return in about 6 months (around 10/07/2021) for CPE (fasting).  Wilfred Lacy, NP

## 2021-04-10 ENCOUNTER — Encounter: Payer: Self-pay | Admitting: Nurse Practitioner

## 2021-04-10 NOTE — Assessment & Plan Note (Signed)
Controlled with metformin and mounjaro injection. Up to date with eye exam: request report Normal foot exam Repeat hgbA1c

## 2021-04-10 NOTE — Assessment & Plan Note (Signed)
LDL not at goal with fenofibrate Unable to tolerate statins Repeat lipid panel

## 2021-04-10 NOTE — Assessment & Plan Note (Addendum)
BP not at goal Not always compliant with DASH diet Compliant with medications and CPAP Current [place] of clonidine, amlodipine, lisinopril. And labetalol. Managed by cardiology at this time. reports home BP 130s/80s-150s/90s BP Readings from Last 3 Encounters:  04/08/21 (!) 156/84  03/22/21 (!) 142/86  03/22/21 (!) 142/86   Maintain med doses, f/up appt with cardiology and DASH diet

## 2021-04-12 ENCOUNTER — Other Ambulatory Visit: Payer: Self-pay | Admitting: Nurse Practitioner

## 2021-04-12 DIAGNOSIS — E782 Mixed hyperlipidemia: Secondary | ICD-10-CM

## 2021-04-13 ENCOUNTER — Ambulatory Visit (INDEPENDENT_AMBULATORY_CARE_PROVIDER_SITE_OTHER): Payer: BC Managed Care – PPO | Admitting: Family Medicine

## 2021-04-13 ENCOUNTER — Encounter (INDEPENDENT_AMBULATORY_CARE_PROVIDER_SITE_OTHER): Payer: Self-pay | Admitting: Family Medicine

## 2021-04-13 ENCOUNTER — Other Ambulatory Visit: Payer: Self-pay

## 2021-04-13 VITALS — BP 172/96 | HR 95 | Temp 98.0°F | Ht 64.0 in | Wt 332.0 lb

## 2021-04-13 DIAGNOSIS — E1169 Type 2 diabetes mellitus with other specified complication: Secondary | ICD-10-CM | POA: Diagnosis not present

## 2021-04-13 DIAGNOSIS — Z6841 Body Mass Index (BMI) 40.0 and over, adult: Secondary | ICD-10-CM

## 2021-04-13 DIAGNOSIS — I10 Essential (primary) hypertension: Secondary | ICD-10-CM

## 2021-04-13 LAB — HEPATIC FUNCTION PANEL
ALT: 16 IU/L (ref 0–32)
AST: 13 IU/L (ref 0–40)
Albumin: 4.3 g/dL (ref 3.8–4.9)
Alkaline Phosphatase: 70 IU/L (ref 44–121)
Bilirubin Total: 0.4 mg/dL (ref 0.0–1.2)
Bilirubin, Direct: 0.12 mg/dL (ref 0.00–0.40)
Total Protein: 6.7 g/dL (ref 6.0–8.5)

## 2021-04-13 LAB — LIPID PANEL
Chol/HDL Ratio: 4.3 ratio (ref 0.0–4.4)
Cholesterol, Total: 202 mg/dL — ABNORMAL HIGH (ref 100–199)
HDL: 47 mg/dL (ref >39)
LDL Chol Calc (NIH): 133 mg/dL — ABNORMAL HIGH (ref 0–99)
Triglycerides: 121 mg/dL (ref 0–149)
VLDL Cholesterol Cal: 22 mg/dL (ref 5–40)

## 2021-04-13 LAB — HEMOGLOBIN A1C
Est. average glucose Bld gHb Est-mCnc: 114 mg/dL
Hgb A1c MFr Bld: 5.6 % (ref 4.8–5.6)

## 2021-04-13 MED ORDER — TIRZEPATIDE 10 MG/0.5ML ~~LOC~~ SOAJ: 10.0000 mg | SUBCUTANEOUS | 0 refills | Status: DC

## 2021-04-13 NOTE — Progress Notes (Signed)
Chief Complaint:   OBESITY Madison Palmer is here to discuss her progress with her obesity treatment plan along with follow-up of her obesity related diagnoses. Madison Palmer is on keeping a food journal and adhering to recommended goals of 1650-1800 calories and 125+ grams protein and states she is following her eating plan approximately 100% of the time. Madison Palmer states she is not currently exercising.  Today's visit was #: 75 Starting weight: 357 lbs Starting date: 02/27/2018 Today's weight: 332 lbs Today's date: 04/13/2021 Total lbs lost to date: 25 Total lbs lost since last in-office visit: 0  Interim History: Madison Palmer celebrated her birthday 5 days ago. She is helping her son navigate what he will do after his associates degree. Pt had a low key Thanksgiving. She is traveling to [place] and then [facility] over Christmas (going to [employer] vs [employer]). She is journaling everyday- some days, she doesn't get all protein in and calorie wise, she is ~1700 a day. She reports occasional hunger if she misses a meal. Pt hasn't done as much activity as she would have like. She recognizes the next few weeks, she will have less control on food choices.  Subjective:   1. Essential hypertension BP elevated. Pt took meds about 30 minutes prior to appt. She is monitoring her BP at home- systolic average 630 and diastolic 16-01. Pt sees cardiology pharmacist.  2. Type 2 diabetes mellitus with other specified complication, without long-term current use of insulin ([place]) Madison Palmer is not experiencing much satiety. She is on Mounjaro 7.5 mg. Her last A1c was better controlled at 5.6.  Assessment/Plan:   1. Essential hypertension Madison Palmer is working on healthy weight loss and exercise to improve blood pressure control. We will watch for signs of hypotension as she continues her lifestyle modifications. Follow up with pharmacist for further management (next appt 12/20).  2. Type 2 diabetes mellitus with other  specified complication, without long-term current use of insulin (HCC) Good blood sugar control is important to decrease the likelihood of diabetic complications such as nephropathy, neuropathy, limb loss, blindness, coronary artery disease, and death. Intensive lifestyle modification including diet, exercise and weight loss are the first line of treatment for diabetes.  Increase Mounjaro to 10 mg weekly.  Increase & Refill- tirzepatide (MOUNJARO) 10 MG/0.5ML Pen; Inject 10 mg into the skin once a week.  Dispense: 6 mL; Refill: 0  3. Obesity with current BMI 57.1  Madison Palmer is currently in the action stage of change. As such, her goal is to continue with weight loss efforts. She has agreed to keeping a food journal and adhering to recommended goals of 1650-1800 calories and 125+ grams protein.   Exercise goals:  As is  Behavioral modification strategies: increasing lean protein intake, meal planning and cooking strategies, keeping healthy foods in the home, travel eating strategies, holiday eating strategies , and keeping a strict food journal.  Madison Palmer has agreed to follow-up with our clinic in 4 weeks. She was informed of the importance of frequent follow-up visits to maximize her success with intensive lifestyle modifications for her multiple health conditions.   Objective:   Blood pressure (!) 172/96, pulse 95, temperature 98 F (36.7 C), height 5\' 4"  (1.626 m), weight (!) 332 lb (150.6 kg), last menstrual period 05/02/2017, SpO2 96 %. Body mass index is 56.99 kg/m.  General: Cooperative, alert, well developed, in no acute distress. HEENT: Conjunctivae and lids unremarkable. Cardiovascular: Regular rhythm.  Lungs: Normal work of breathing. Neurologic: No focal deficits.  Lab Results  Component Value Date   CREATININE 1.12 (H) 09/01/2020   BUN 24 09/01/2020   NA 142 09/01/2020   K 4.4 09/01/2020   CL 102 09/01/2020   CO2 24 09/01/2020   Lab Results  Component Value Date   ALT  16 04/12/2021   AST 13 04/12/2021   ALKPHOS 70 04/12/2021   BILITOT 0.4 04/12/2021   Lab Results  Component Value Date   HGBA1C 5.6 04/12/2021   HGBA1C 5.6 06/01/2020   HGBA1C 5.7 (H) 12/11/2019   HGBA1C 5.7 (H) 12/24/2018   HGBA1C 6.0 (H) 02/27/2018   Lab Results  Component Value Date   INSULIN 18.9 06/01/2020   INSULIN 11.4 12/11/2019   INSULIN 21.1 12/30/2018   INSULIN 9.6 02/27/2018   Lab Results  Component Value Date   TSH 2.340 09/01/2020   Lab Results  Component Value Date   CHOL 202 (H) 04/12/2021   HDL 47 04/12/2021   LDLCALC 133 (H) 04/12/2021   TRIG 121 04/12/2021   CHOLHDL 4.3 04/12/2021   Lab Results  Component Value Date   VD25OH 29.0 (L) 09/01/2020   VD25OH 19.7 (L) 06/01/2020   VD25OH 36.5 12/30/2018   Lab Results  Component Value Date   WBC 5.4 09/25/2018   HGB 12.7 09/25/2018   HCT 40.4 09/25/2018   MCV 94.6 09/25/2018   PLT 229 09/25/2018   Lab Results  Component Value Date   IRON 44 02/20/2018   TIBC 347 05/29/2017    Attestation Statements:   Reviewed by clinician on day of visit: allergies, medications, problem list, medical history, surgical history, family history, social history, and previous encounter notes.  Coral Ceo, CMA, am acting as transcriptionist for Coralie Common, MD.   I have reviewed the above documentation for accuracy and completeness, and I agree with the above. - Coralie Common, MD

## 2021-04-15 MED ORDER — ATORVASTATIN CALCIUM 20 MG PO TABS: 20.0000 mg | ORAL_TABLET | ORAL | 5 refills | Status: DC

## 2021-04-15 NOTE — Addendum Note (Signed)
Addended by: Wilfred Lacy L on: 04/15/2021 11:17 AM   Modules accepted: Orders

## 2021-04-18 ENCOUNTER — Encounter: Payer: Self-pay | Admitting: Nurse Practitioner

## 2021-04-19 ENCOUNTER — Other Ambulatory Visit: Payer: Self-pay

## 2021-04-19 ENCOUNTER — Ambulatory Visit (INDEPENDENT_AMBULATORY_CARE_PROVIDER_SITE_OTHER): Payer: BC Managed Care – PPO | Admitting: Pharmacist Clinician (PhC)/ Clinical Pharmacy Specialist

## 2021-04-19 VITALS — BP 130/82 | HR 81 | Resp 17 | Ht 64.0 in

## 2021-04-19 DIAGNOSIS — I10 Essential (primary) hypertension: Secondary | ICD-10-CM

## 2021-04-19 MED ORDER — CHLORTHALIDONE 25 MG PO TABS: 25.0000 mg | ORAL_TABLET | Freq: Every day | ORAL | 3 refills | Status: DC

## 2021-04-19 NOTE — Progress Notes (Signed)
04/19/2021 Madison Palmer January 05, 1966 [id number]   HPI:  Madison Palmer is a 55 y.o. female patient of Dr Percival Spanish, who presents today for hypertension clinic evaluation.  In addition to hypertension, her medical history is significant for OSA, DM (A1c 5.6 on metformin and Mounjaro) and arrhythmias.  At her most recent visit BP was still elevated at 174/106.  She was counseled on sodium intake and encouraged to find other sources for meals besides [employer].  Labetalol was increased to 450 mg twice daily and she was encouraged to check home BP readings more frequently.  At follow up there was slight improvement in BP readings and she had been working on eating out less frequently.  Amlodipine was increased to 10 mg daily.    Today she returns for follow up.  She increased the Mounjuro to 10 mg this past week and is tolerating well.  She does note that it has not caused any significant GI issues and on the occasional days that she eats only 1 big meal, she does not feel any worse off.  Feels that she will need to continue increasing dose.  She notes that she will be travelling for the next 2 weeks, so will be eating take out more frequently until the end of the year.    Blood Pressure Goal:  130/80  Current Medications: Amlodipine 10 mg (pm) Clonidine 0.1mg  BID, Furosemide 40 mg (am), Labetalol 450mg  BID, Lisinopril 40 mg (am)  Family Hx:  adopted; no knowledge of family history.   Social Hx:  No tobacco, no alcohol.   Diet:  Predominately fast food (loves [employer]). Is eating 125g of protein/day per weight loss clinic; tracked with [employer]. Appetite is diminishing per patient but she makes the effort to meet her protein goal. Not eating a lot of fruits and veggies. Caloric intake set to 1600-1800 calories/day. Drinks 1/2 decaf 1/2 caffeine coffee in the mornings then all decaf for the rest of the day. Drinks Iced tea occasionally  Exercise: none  Home BP readings: home [employer] arm  cuff.  Read within 5 points in the office today.   AM: 23 readings average 151/90  HR 76 (range 130-72/75-100) previous avg 155/96   PM: 27 readings average  169/94  HF 78  (range 159-189/88-106) previous avg 174/100   Intolerances: Rosuvastatin, Atorvastatin, Hydralazine, Doxycycline, fentanyl, levofloxacin, midazolam  Labs: 5/22:  Na 142, K 4.4, Glu 111, BUN 24, SCr 1.12 GFR 58   Wt Readings from Last 3 Encounters:  04/13/21 (!) 332 lb (150.6 kg)  04/08/21 (!) 333 lb (151 kg)  04/05/21 (!) 334 lb (151.5 kg)   BP Readings from Last 3 Encounters:  04/19/21 130/82  04/13/21 (!) 172/96  04/08/21 (!) 156/84   Pulse Readings from Last 3 Encounters:  04/19/21 81  04/13/21 95  04/08/21 100    Current Outpatient Medications  Medication Sig Dispense Refill   acetaminophen (TYLENOL) 500 MG tablet Take 1,000 mg by mouth 3 (three) times daily as needed for moderate pain or headache.     amLODipine (NORVASC) 10 MG tablet Take 1 tablet (10 mg total) by mouth daily. 90 tablet 3   cetirizine (ZYRTEC ALLERGY) 10 MG tablet Take 1 tablet (10 mg total) by mouth daily. 90 tablet 1   chlorthalidone (HYGROTON) 25 MG tablet Take 1 tablet (25 mg total) by mouth daily. 90 tablet 3   Cholecalciferol (VITAMIN D3 MAXIMUM STRENGTH) 125 MCG (5000 UT) capsule Take 5,000 Units by mouth daily.  cloNIDine (CATAPRES) 0.1 MG tablet 2 (two) times daily.      divalproex (DEPAKOTE) 250 MG DR tablet Take 1 tablet (250 mg total) by mouth at bedtime. 30 tablet 2   esomeprazole (NEXIUM) 40 MG capsule TAKE ONE CAPSULE BY MOUTH TWICE DAILY 180 capsule 0   fenofibrate (TRICOR) 145 MG tablet Take 1 tablet (145 mg total) by mouth daily. 90 tablet 3   fluticasone (FLONASE) 50 MCG/ACT nasal spray Place 2 sprays into both nostrils daily.     furosemide (LASIX) 40 MG tablet TAKE 1 TABLET(40 MG) BY MOUTH DAILY 90 tablet 3   gabapentin (NEURONTIN) 100 MG capsule Take 2 capsules (200 mg total) by mouth at bedtime. 180 capsule 3    labetalol (NORMODYNE) 300 MG tablet Take 1.5 tablets (450 mg total) by mouth 2 (two) times daily. 270 tablet 3   levothyroxine (SYNTHROID) 75 MCG tablet Take 1 tablet (75 mcg total) by mouth daily. 90 tablet 0   lisinopril (ZESTRIL) 40 MG tablet Take 1 tablet (40 mg total) by mouth daily. 90 tablet 3   Lurasidone HCl 60 MG TABS Take 1 tablet (60 mg total) by mouth in the morning. 30 tablet 2   metFORMIN (GLUCOPHAGE) 500 MG tablet Take 1 tablet (500 mg total) by mouth daily with breakfast. 90 tablet 0   Polyethylene Glycol 3350 (MIRALAX PO) Take by mouth.     Potassium Chloride ER 20 MEQ TBCR TAKE 1 TABLET BY MOUTH EVERY DAY 90 tablet 2   TART CHERRY PO Take by mouth daily.     tirzepatide (MOUNJARO) 10 MG/0.5ML Pen Inject 10 mg into the skin once a week. 6 mL 0   atorvastatin (LIPITOR) 20 MG tablet Take 1 tablet (20 mg total) by mouth 3 (three) times a week. (Patient not taking: Reported on 04/19/2021) 12 tablet 5   No current facility-administered medications for this visit.    Allergies  Allergen Reactions   Other     Other reaction(s): Other (See Comments) Instructed not to take by Cardiology.   Fentanyl Hives    Other reaction(s): hives had fentanyl recently with benadryl, did OK   Levofloxacin Hives   Midazolam Hives   Pollen Extract     seasonal   Atorvastatin     Muscle pain in legs  Other reaction(s): LEG PAIN   Doxycycline Itching and Swelling    Pt reports she is not allergic to this and doesn't know why it says itching and swelling    Hydralazine Hcl Other (See Comments)    Hypercalcemia    Rosuvastatin     Abdominal pain Other reaction(s): diarrhea    Past Medical History:  Diagnosis Date   Acute on chronic respiratory failure with hypoxia and hypercapnia ([facility]) 09/15/2018   Anemia    Anxiety    Arrhythmia    tachycardia   Arthritis    Chickenpox    Chronic respiratory failure with hypoxia ([place]) 05/10/2018   Formatting of this note might be different from  the original. Last Assessment & Plan:  Continue 1 L continuous on exertion and during sleep. On her next visit we will check OSA on CPAP/room air to decide whether she needs to take oxygen along with her on her cruise in May   Depression    Diverticulitis    GERD (gastroesophageal reflux disease)    Glaucoma    Hyperlipidemia    Hyperparathyroidism ([place])    Hypertension    Inflammatory polyps of colon ([place])  LVH (left ventricular hypertrophy)    Lymphedema    PCOS (polycystic ovarian syndrome)    Prediabetes    Recurrent UTI    Sleep apnea    CPAP   Thyroid disease    Vitamin D deficiency     Blood pressure 130/82, pulse 81, resp. rate 17, height 5\' 4"  (1.626 m), last menstrual period 05/02/2017, SpO2 96 %.  Essential hypertension Patient with uncontrolled hypertension now on 4 medications.  While readings did improve slightly this past month, and has a good reading in the office, her home readings are still overall too high.  Will add in chlorthalidone 25 mg once daily.  Discussed increasing labetalol to 600 mg as another option, but will start by making sure she is on ACE/thiazide/CCB combination.  She will need to repeat BMET in 2 weeks and continue with regular home BP monitoring (after coming back from vacation).  Will see her back in 6 weeks for follow up.     Tommy Medal PharmD CPP [facility] [address] [place], [place] [address] [phone]

## 2021-04-19 NOTE — Patient Instructions (Signed)
Return for a a follow up appointment January 30 at 3 pm  Go to the lab after January 1 to check kidney function  Check your blood pressure at home daily (after the holidays) and keep record of the readings.  Take your BP meds as follows:  Start chlorthalidone 25 mg once daily in the mornings  Continue with all other medications  Bring all of your meds, your BP cuff and your record of home blood pressures to your next appointment.  Exercise as youre able, try to walk approximately 30 minutes per day.  Keep salt intake to a minimum, especially watch canned and prepared boxed foods.  Eat more fresh fruits and vegetables and fewer canned items.  Avoid eating in fast food restaurants.    HOW TO TAKE YOUR BLOOD PRESSURE: Rest 5 minutes before taking your blood pressure.  Dont smoke or drink caffeinated beverages for at least 30 minutes before. Take your blood pressure before (not after) you eat. Sit comfortably with your back supported and both feet on the floor (dont cross your legs). Elevate your arm to heart level on a table or a desk. Use the proper sized cuff. It should fit smoothly and snugly around your bare upper arm. There should be enough room to slip a fingertip under the cuff. The bottom edge of the cuff should be 1 inch above the crease of the elbow. Ideally, take 3 measurements at one sitting and record the average.

## 2021-04-19 NOTE — Assessment & Plan Note (Signed)
Patient with uncontrolled hypertension now on 4 medications.  While readings did improve slightly this past month, and has a good reading in the office, her home readings are still overall too high.  Will add in chlorthalidone 25 mg once daily.  Discussed increasing labetalol to 600 mg as another option, but will start by making sure she is on ACE/thiazide/CCB combination.  She will need to repeat BMET in 2 weeks and continue with regular home BP monitoring (after coming back from vacation).  Will see her back in 6 weeks for follow up.

## 2021-05-07 ENCOUNTER — Other Ambulatory Visit: Payer: Self-pay | Admitting: Cardiology

## 2021-05-07 ENCOUNTER — Other Ambulatory Visit: Payer: Self-pay | Admitting: Gastroenterology

## 2021-05-07 ENCOUNTER — Encounter: Payer: Self-pay | Admitting: Nurse Practitioner

## 2021-05-09 ENCOUNTER — Encounter: Payer: Self-pay | Admitting: Pharmacist Clinician (PhC)/ Clinical Pharmacy Specialist

## 2021-05-09 ENCOUNTER — Telehealth (HOSPITAL_COMMUNITY): Payer: Self-pay | Admitting: *Deleted

## 2021-05-09 NOTE — Telephone Encounter (Signed)
She can try reducing latuda 40 mg to see if tremors get better.

## 2021-05-09 NOTE — Telephone Encounter (Signed)
Received call from pt today regarding on going issue with tremors. Pt states on last visit this was discussed but at that time she didn't want to change either the Depakote or Latuda, as reflected in your note. However pt feels tremors have gotten more pronounced and are constant. She is wondering now if medication needs to be changed or something added. Please review and advise.

## 2021-05-10 ENCOUNTER — Other Ambulatory Visit (HOSPITAL_COMMUNITY): Payer: Self-pay | Admitting: *Deleted

## 2021-05-10 DIAGNOSIS — F419 Anxiety disorder, unspecified: Secondary | ICD-10-CM

## 2021-05-10 DIAGNOSIS — F319 Bipolar disorder, unspecified: Secondary | ICD-10-CM

## 2021-05-10 MED ORDER — LURASIDONE HCL 40 MG PO TABS: 40.0000 mg | ORAL_TABLET | Freq: Every day | ORAL | 2 refills | Status: DC

## 2021-05-11 ENCOUNTER — Other Ambulatory Visit: Payer: Self-pay

## 2021-05-11 ENCOUNTER — Encounter (INDEPENDENT_AMBULATORY_CARE_PROVIDER_SITE_OTHER): Payer: Self-pay | Admitting: Family Medicine

## 2021-05-11 ENCOUNTER — Ambulatory Visit (INDEPENDENT_AMBULATORY_CARE_PROVIDER_SITE_OTHER): Payer: BC Managed Care – PPO | Admitting: Family Medicine

## 2021-05-11 VITALS — BP 114/74 | HR 77 | Temp 98.1°F | Ht 64.0 in | Wt 328.0 lb

## 2021-05-11 DIAGNOSIS — E1169 Type 2 diabetes mellitus with other specified complication: Secondary | ICD-10-CM

## 2021-05-11 DIAGNOSIS — Z6841 Body Mass Index (BMI) 40.0 and over, adult: Secondary | ICD-10-CM

## 2021-05-11 DIAGNOSIS — E669 Obesity, unspecified: Secondary | ICD-10-CM

## 2021-05-11 DIAGNOSIS — I152 Hypertension secondary to endocrine disorders: Secondary | ICD-10-CM

## 2021-05-11 DIAGNOSIS — E1159 Type 2 diabetes mellitus with other circulatory complications: Secondary | ICD-10-CM | POA: Diagnosis not present

## 2021-05-11 MED ORDER — TIRZEPATIDE 10 MG/0.5ML ~~LOC~~ SOAJ: 10.0000 mg | SUBCUTANEOUS | 0 refills | Status: DC

## 2021-05-12 ENCOUNTER — Telehealth: Payer: Self-pay | Admitting: Gastroenterology

## 2021-05-12 MED ORDER — ESOMEPRAZOLE MAGNESIUM 40 MG PO CPDR: 40.0000 mg | DELAYED_RELEASE_CAPSULE | Freq: Two times a day (BID) | ORAL | 0 refills | Status: DC

## 2021-05-12 NOTE — Progress Notes (Signed)
Chief Complaint:   OBESITY Madison Palmer is here to discuss her progress with her obesity treatment plan along with follow-up of her obesity related diagnoses. Madison Palmer is on keeping a food journal and adhering to recommended goals of 1650-1800 calories and 125+ grams protein and states she is following her eating plan approximately 70% of the time. Madison Palmer states she is not currently exercising.  Today's visit was #: 58 Starting weight: 357 lbs Starting date: 02/27/2018 Today's weight: 328 lbs Today's date: 05/11/2021 Total lbs lost to date: 29 Total lbs lost since last in-office visit: 4  Interim History: Pt had a busy holiday season- traveled to see mother-in-law, then up to [place] to see family and friends, and also saw a Doctor, general practice vs [employer]. She was tired but did enjoy herself. Pt had decrease in appetite and was significantly short on protein and some increase with indulgent eating. Her average calorie intake last week 1420. It's a push to get 2 meals/day.  Subjective:   1. Type 2 diabetes mellitus with other specified complication, without long-term current use of insulin (HCC) Pt is on Mounjaro 10 mg with no GI side effects but notes some dizziness. She is having a decline in appetite and is skipping a meal.  2. Hypertension associated with diabetes ([place]) BP controlled today. Pt denies chest pain/chest pressure/headache. She is doing reasonably well on new meds.  Assessment/Plan:   1. Type 2 diabetes mellitus with other specified complication, without long-term current use of insulin (HCC) Good blood sugar control is important to decrease the likelihood of diabetic complications such as nephropathy, neuropathy, limb loss, blindness, coronary artery disease, and death. Intensive lifestyle modification including diet, exercise and weight loss are the first line of treatment for diabetes. Pt encouraged to eat in the morning, even if not a full breakfast.  Refill- tirzepatide  (MOUNJARO) 10 MG/0.5ML Pen; Inject 10 mg into the skin once a week.  Dispense: 6 mL; Refill: 0  2. Hypertension associated with diabetes ([place]) Madison Palmer is working on healthy weight loss and exercise to improve blood pressure control. We will watch for signs of hypotension as she continues her lifestyle modifications. F/u with cardiology for management. Continue current treatment plan.  3. Obesity with current BMI 56.4  Madison Palmer is currently in the action stage of change. As such, her goal is to continue with weight loss efforts. She has agreed to keeping a food journal and adhering to recommended goals of 1600-1800 calories and 125+ grams protein.   Exercise goals:  Pt is planning to get back to [facility] for swimming at the end of January.  Behavioral modification strategies: increasing lean protein intake, meal planning and cooking strategies, keeping healthy foods in the home, planning for success, and keeping a strict food journal.  Madison Palmer has agreed to follow-up with our clinic in 3 weeks. She was informed of the importance of frequent follow-up visits to maximize her success with intensive lifestyle modifications for her multiple health conditions.   Objective:   Blood pressure 114/74, pulse 77, temperature 98.1 F (36.7 C), height 5\' 4"  (1.626 m), weight (!) 328 lb (148.8 kg), last menstrual period 05/02/2017, SpO2 98 %. Body mass index is 56.3 kg/m.  General: Cooperative, alert, well developed, in no acute distress. HEENT: Conjunctivae and lids unremarkable. Cardiovascular: Regular rhythm.  Lungs: Normal work of breathing. Neurologic: No focal deficits.   Lab Results  Component Value Date   CREATININE 1.12 (H) 09/01/2020   BUN 24 09/01/2020   NA  142 09/01/2020   K 4.4 09/01/2020   CL 102 09/01/2020   CO2 24 09/01/2020   Lab Results  Component Value Date   ALT 16 04/12/2021   AST 13 04/12/2021   ALKPHOS 70 04/12/2021   BILITOT 0.4 04/12/2021   Lab Results  Component Value  Date   HGBA1C 5.6 04/12/2021   HGBA1C 5.6 06/01/2020   HGBA1C 5.7 (H) 12/11/2019   HGBA1C 5.7 (H) 12/24/2018   HGBA1C 6.0 (H) 02/27/2018   Lab Results  Component Value Date   INSULIN 18.9 06/01/2020   INSULIN 11.4 12/11/2019   INSULIN 21.1 12/30/2018   INSULIN 9.6 02/27/2018   Lab Results  Component Value Date   TSH 2.340 09/01/2020   Lab Results  Component Value Date   CHOL 202 (H) 04/12/2021   HDL 47 04/12/2021   LDLCALC 133 (H) 04/12/2021   TRIG 121 04/12/2021   CHOLHDL 4.3 04/12/2021   Lab Results  Component Value Date   VD25OH 29.0 (L) 09/01/2020   VD25OH 19.7 (L) 06/01/2020   VD25OH 36.5 12/30/2018   Lab Results  Component Value Date   WBC 5.4 09/25/2018   HGB 12.7 09/25/2018   HCT 40.4 09/25/2018   MCV 94.6 09/25/2018   PLT 229 09/25/2018   Lab Results  Component Value Date   IRON 44 02/20/2018   TIBC 347 05/29/2017    Attestation Statements:   Reviewed by clinician on day of visit: allergies, medications, problem list, medical history, surgical history, family history, social history, and previous encounter notes.  Coral Ceo, CMA, am acting as transcriptionist for Coralie Common, MD.   I have reviewed the above documentation for accuracy and completeness, and I agree with the above. - Coralie Common, MD

## 2021-05-12 NOTE — Telephone Encounter (Signed)
Refill for Nexium has been sent to patients pharmacy. Pt will need to keep follow-up appointment that is scheduled for feb in order to rec further refills.Pt is overdue for endoscopies.

## 2021-05-12 NOTE — Telephone Encounter (Signed)
Patient needs a refill on esomeprazole.  She takes 49 mg twice a day.  She was told she couldn't get refills unless she had an OV.  She has one scheduled for  06/22/21 at 2:50 p.m.  Can you call enough medicine in for her to last until this appointment?  Please call patient and advise.  Thank you.

## 2021-05-21 LAB — BASIC METABOLIC PANEL
BUN/Creatinine Ratio: 25 — ABNORMAL HIGH (ref 9–23)
BUN: 30 mg/dL — ABNORMAL HIGH (ref 6–24)
CO2: 28 mmol/L (ref 20–29)
Calcium: 11 mg/dL — ABNORMAL HIGH (ref 8.7–10.2)
Chloride: 96 mmol/L (ref 96–106)
Creatinine, Ser: 1.2 mg/dL — ABNORMAL HIGH (ref 0.57–1.00)
Glucose: 110 mg/dL — ABNORMAL HIGH (ref 70–99)
Potassium: 4.3 mmol/L (ref 3.5–5.2)
Sodium: 139 mmol/L (ref 134–144)
eGFR: 53 mL/min/{1.73_m2} — ABNORMAL LOW (ref >59)

## 2021-05-28 ENCOUNTER — Other Ambulatory Visit (INDEPENDENT_AMBULATORY_CARE_PROVIDER_SITE_OTHER): Payer: Self-pay | Admitting: Family Medicine

## 2021-05-28 DIAGNOSIS — R7303 Prediabetes: Secondary | ICD-10-CM

## 2021-05-28 DIAGNOSIS — E038 Other specified hypothyroidism: Secondary | ICD-10-CM

## 2021-05-30 ENCOUNTER — Encounter: Payer: Self-pay | Admitting: Pharmacist Clinician (PhC)/ Clinical Pharmacy Specialist

## 2021-05-30 ENCOUNTER — Encounter (INDEPENDENT_AMBULATORY_CARE_PROVIDER_SITE_OTHER): Payer: Self-pay | Admitting: Family Medicine

## 2021-05-30 ENCOUNTER — Other Ambulatory Visit: Payer: Self-pay

## 2021-05-30 ENCOUNTER — Ambulatory Visit (INDEPENDENT_AMBULATORY_CARE_PROVIDER_SITE_OTHER): Payer: BC Managed Care – PPO | Admitting: Family Medicine

## 2021-05-30 ENCOUNTER — Ambulatory Visit (INDEPENDENT_AMBULATORY_CARE_PROVIDER_SITE_OTHER): Payer: BC Managed Care – PPO | Admitting: Pharmacist Clinician (PhC)/ Clinical Pharmacy Specialist

## 2021-05-30 VITALS — BP 120/76 | HR 88 | Temp 98.0°F | Ht 64.0 in | Wt 327.0 lb

## 2021-05-30 DIAGNOSIS — E1169 Type 2 diabetes mellitus with other specified complication: Secondary | ICD-10-CM

## 2021-05-30 DIAGNOSIS — I10 Essential (primary) hypertension: Secondary | ICD-10-CM

## 2021-05-30 DIAGNOSIS — E669 Obesity, unspecified: Secondary | ICD-10-CM | POA: Diagnosis not present

## 2021-05-30 DIAGNOSIS — Z6841 Body Mass Index (BMI) 40.0 and over, adult: Secondary | ICD-10-CM

## 2021-05-30 DIAGNOSIS — Z7985 Long-term (current) use of injectable non-insulin antidiabetic drugs: Secondary | ICD-10-CM

## 2021-05-30 MED ORDER — METFORMIN HCL 500 MG PO TABS: 500.0000 mg | ORAL_TABLET | Freq: Every day | ORAL | 0 refills | Status: DC

## 2021-05-30 MED ORDER — TIRZEPATIDE 12.5 MG/0.5ML ~~LOC~~ SOAJ: 12.5000 mg | SUBCUTANEOUS | 0 refills | Status: DC

## 2021-05-30 NOTE — Progress Notes (Signed)
Chief Complaint:   OBESITY Madison Palmer is here to discuss her progress with her obesity treatment plan along with follow-up of her obesity related diagnoses. Madison Palmer is on keeping a food journal and adhering to recommended goals of 1600-1800 calories and 125+ grams protein and states she is following her eating plan approximately 60% of the time. Madison Palmer states she is not currently exercising.  Today's visit was #: 76 Starting weight: 357 lbs Starting date: 02/27/2018 Today's weight: 327 lbs Today's date: 05/30/2021 Total lbs lost to date: 30 Total lbs lost since last in-office visit: 1  Interim History: Pt had unexpected travel day last week. She has no new onset stressors. Pt has done very well with journaling daily. It is a bit more challenging getting back on track after the holidays. She went without Mounjaro for a week due to national drug shortage. Pt is getting 100-125 grams protein daily and between 1700-1800 cal/day.  Subjective:   1. Essential hypertension BP well controlled today. Pt denies chest pain/chest pressure/headache.  2. Type 2 diabetes mellitus with other specified complication, without long-term current use of insulin (HCC) Pt is on Metformin and Mounjaro. She reports still has some hunger and increased desire for carbs.  Assessment/Plan:   1. Essential hypertension Madison Palmer is working on healthy weight loss and exercise to improve blood pressure control. We will watch for signs of hypotension as she continues her lifestyle modifications. Continue current treatment plan with no change in dosing. F/u with carbs.  2. Type 2 diabetes mellitus with other specified complication, without long-term current use of insulin (HCC) Good blood sugar control is important to decrease the likelihood of diabetic complications such as nephropathy, neuropathy, limb loss, blindness, coronary artery disease, and death. Intensive lifestyle modification including diet, exercise and weight  loss are the first line of treatment for diabetes. Melena will increase Mounjaro to 12.5 mg and continue Metformin.  Refill- tirzepatide (MOUNJARO) 12.5 MG/0.5ML Pen; Inject 12.5 mg into the skin once a week.  Dispense: 2 mL; Refill: 0  Refill- metFORMIN (GLUCOPHAGE) 500 MG tablet; Take 1 tablet (500 mg total) by mouth daily with breakfast.  Dispense: 90 tablet; Refill: 0  3. Obesity with current BMI 56.2 Madison Palmer is currently in the action stage of change. As such, her goal is to continue with weight loss efforts. She has agreed to keeping a food journal and adhering to recommended goals of 1600-1800 calories and 125+ grams protein.   Exercise goals: All adults should avoid inactivity. Some physical activity is better than none, and adults who participate in any amount of physical activity gain some health benefits. Swim 2-3 times a week.  Behavioral modification strategies: increasing lean protein intake, meal planning and cooking strategies, keeping healthy foods in the home, and planning for success.  Madison Palmer has agreed to follow-up with our clinic in 3 weeks. She was informed of the importance of frequent follow-up visits to maximize her success with intensive lifestyle modifications for her multiple health conditions.   Objective:   Blood pressure 120/76, pulse 88, temperature 98 F (36.7 C), height 5\' 4"  (1.626 m), weight (!) 327 lb (148.3 kg), last menstrual period 05/02/2017, SpO2 98 %. Body mass index is 56.13 kg/m.  General: Cooperative, alert, well developed, in no acute distress. HEENT: Conjunctivae and lids unremarkable. Cardiovascular: Regular rhythm.  Lungs: Normal work of breathing. Neurologic: No focal deficits.   Lab Results  Component Value Date   CREATININE 1.20 (H) 05/20/2021   BUN 30 (H) 05/20/2021  NA 139 05/20/2021   K 4.3 05/20/2021   CL 96 05/20/2021   CO2 28 05/20/2021   Lab Results  Component Value Date   ALT 16 04/12/2021   AST 13 04/12/2021    ALKPHOS 70 04/12/2021   BILITOT 0.4 04/12/2021   Lab Results  Component Value Date   HGBA1C 5.6 04/12/2021   HGBA1C 5.6 06/01/2020   HGBA1C 5.7 (H) 12/11/2019   HGBA1C 5.7 (H) 12/24/2018   HGBA1C 6.0 (H) 02/27/2018   Lab Results  Component Value Date   INSULIN 18.9 06/01/2020   INSULIN 11.4 12/11/2019   INSULIN 21.1 12/30/2018   INSULIN 9.6 02/27/2018   Lab Results  Component Value Date   TSH 2.340 09/01/2020   Lab Results  Component Value Date   CHOL 202 (H) 04/12/2021   HDL 47 04/12/2021   LDLCALC 133 (H) 04/12/2021   TRIG 121 04/12/2021   CHOLHDL 4.3 04/12/2021   Lab Results  Component Value Date   VD25OH 29.0 (L) 09/01/2020   VD25OH 19.7 (L) 06/01/2020   VD25OH 36.5 12/30/2018   Lab Results  Component Value Date   WBC 5.4 09/25/2018   HGB 12.7 09/25/2018   HCT 40.4 09/25/2018   MCV 94.6 09/25/2018   PLT 229 09/25/2018   Lab Results  Component Value Date   IRON 44 02/20/2018   TIBC 347 05/29/2017    Attestation Statements:   Reviewed by clinician on day of visit: allergies, medications, problem list, medical history, surgical history, family history, social history, and previous encounter notes.  Coral Ceo, CMA, am acting as transcriptionist for Coralie Common, MD.   I have reviewed the above documentation for accuracy and completeness, and I agree with the above. - Coralie Common, MD

## 2021-05-30 NOTE — Patient Instructions (Addendum)
Return for a a follow up appointment Monday April 24 at 3 pm  Check your blood pressure at home 3-4 days per week and keep record of the readings.  Take your BP meds as follows  Continue with all current medications  Bring all of your meds, your BP cuff and your record of home blood pressures to your next appointment.  Exercise as youre able, try to walk approximately 30 minutes per day.  Keep salt intake to a minimum, especially watch canned and prepared boxed foods.  Eat more fresh fruits and vegetables and fewer canned items.  Avoid eating in fast food restaurants.    HOW TO TAKE YOUR BLOOD PRESSURE: Rest 5 minutes before taking your blood pressure.  Dont smoke or drink caffeinated beverages for at least 30 minutes before. Take your blood pressure before (not after) you eat. Sit comfortably with your back supported and both feet on the floor (dont cross your legs). Elevate your arm to heart level on a table or a desk. Use the proper sized cuff. It should fit smoothly and snugly around your bare upper arm. There should be enough room to slip a fingertip under the cuff. The bottom edge of the cuff should be 1 inch above the crease of the elbow. Ideally, take 3 measurements at one sitting and record the average.

## 2021-06-01 ENCOUNTER — Encounter: Payer: Self-pay | Admitting: Pharmacist Clinician (PhC)/ Clinical Pharmacy Specialist

## 2021-06-01 NOTE — Progress Notes (Signed)
06/01/2021 Madison Palmer June 05, 1965 [id number]    HPI: BG is a 56 y/o female with a history of hypertension, arrhythmias, chronic diastolic heart failure, OSA, elevated coronary artery calcium score, GERD, and diabetes, presenting today for hypertension follow-up. She was last seen by Dr. Brion Aliment on 04/19/2021 for hypertension control where her BP in the office was 130/82. At this visit, she reported she had increased her Mounjaro to 10 mg and was tolerating it well. She reported it was not causing any significant GI issues and does not feel any different when her diet changes. At this appointment, her home readings averaged 151/90 in the mornings and 169/94 in the evenings. Due to her home readings being elevated still, she was started on chlorthalidone 25 mg daily in addition to her current medications. She was scheduled to get lab work in two weeks. She was traveling during the holidays and said she will be eating take out more frequently. She met with Dr. Jearld Shines for diabetes management on 05/11/2021, where blood pressure was 114/74 and her current regimen was continued.   Feeling OK today, said her appointment this morning went well at healthy weight and wellness. Her Mounjaro was increased to 12.1m but she said she will not start that for two weeks until she finishes her current 132m Says she does not feel like her appetite is quite curbed enough and wanted to increase to 12.16m9mf Mounjaro today. Said she was able to go [place] [employer] [place] over the holidays.   PMH:  Diabetes - Mounjaro 71m2metformin 500 mg daily (A1c 5.6 04/12/2021) GERD - Nexium 40mg31m HTN - meds seen below   Blood pressure goal < 130/80   Current Medications: Chlorthalidone 25 mg daily (AM probably) Amlodipine 71mg 7my (PM) Labetalol 450 mg BID Lisinopril 40 mg daily (AM) Clonidine 0.1mg BI29mFurosemide 40 mg daily (AM)  Family Hx: Adopted, no knowledge of family history    Social Hx: No tobacco, no alcohol, 3-4 cups of coffee per day (either drinks half and half or decaf only), [employer] sweet tea, no soda  Diet: Ate out a lot while traveling, salads, trying to eat more greens, tries to limit salad dressing, [employer] salads and use half pack of fat free honey mustard dressing; does not snack really; has been eating a lot of fast food; uses seasoning while cooking ([place]) [place]: Just received knee injection, hoping to start going to the pool in two weeks  Home BP Readings: Morning Average: 125/77 (ranges: 94-156/59-91) Evening Average: 144/83 (ranges: 125-165/71-135)  In-Clinic Reading: 99/65  Intolerances: Rosuvastatin - abdominal pain, diarrhea Atorvastatin - muscle pain  Hydralazine - hypercalcemia Doxycycline - not actually allergic to Fentanyl - hives Levofloxacin - hives Midazolam - hives  Labs: 05/20/2021: Na 139, K 4.3, BUN 30, SCr 1.20, eGFR 53, Glu 110 04/12/2021: A1c 5.6 04/12/2021: TC 202, TG 121, HDL 47, LDL 133   Wt Readings from Last 3 Encounters:  05/30/21 (!) 327 lb (148.3 kg)  05/30/21 (!) 327 lb (148.3 kg)  05/11/21 (!) 328 lb (148.8 kg)   BP Readings from Last 3 Encounters:  05/30/21 99/65  05/30/21 120/76  05/11/21 114/74   Pulse Readings from Last 3 Encounters:  05/30/21 87  05/30/21 88  05/11/21 77    Current Outpatient Medications  Medication Sig Dispense Refill   acetaminophen (TYLENOL) 500 MG tablet Take 1,000 mg by mouth 3 (three) times daily as needed for moderate  pain or headache.     amLODipine (NORVASC) 10 MG tablet Take 1 tablet (10 mg total) by mouth daily. 90 tablet 3   atorvastatin (LIPITOR) 20 MG tablet Take 1 tablet (20 mg total) by mouth 3 (three) times a week. 12 tablet 5   cetirizine (ZYRTEC ALLERGY) 10 MG tablet Take 1 tablet (10 mg total) by mouth daily. 90 tablet 1   chlorthalidone (HYGROTON) 25 MG tablet Take 1 tablet (25 mg total) by mouth daily. 90 tablet 3    Cholecalciferol (VITAMIN D3 MAXIMUM STRENGTH) 125 MCG (5000 UT) capsule Take 5,000 Units by mouth daily.     cloNIDine (CATAPRES) 0.1 MG tablet 2 (two) times daily.      divalproex (DEPAKOTE) 250 MG DR tablet Take 1 tablet (250 mg total) by mouth at bedtime. 30 tablet 2   esomeprazole (NEXIUM) 40 MG capsule Take 1 capsule (40 mg total) by mouth 2 (two) times daily. 180 capsule 0   fenofibrate (TRICOR) 145 MG tablet Take 1 tablet (145 mg total) by mouth daily. 90 tablet 3   fluticasone (FLONASE) 50 MCG/ACT nasal spray Place 2 sprays into both nostrils daily.     furosemide (LASIX) 40 MG tablet TAKE 1 TABLET(40 MG) BY MOUTH DAILY 90 tablet 3   gabapentin (NEURONTIN) 100 MG capsule Take 2 capsules (200 mg total) by mouth at bedtime. 180 capsule 3   labetalol (NORMODYNE) 300 MG tablet Take 1.5 tablets (450 mg total) by mouth 2 (two) times daily. 270 tablet 3   levothyroxine (SYNTHROID) 75 MCG tablet Take 1 tablet (75 mcg total) by mouth daily. 90 tablet 0   lisinopril (ZESTRIL) 40 MG tablet Take 1 tablet (40 mg total) by mouth daily. 90 tablet 3   lurasidone (LATUDA) 40 MG TABS tablet Take 1 tablet (40 mg total) by mouth daily with breakfast. 30 tablet 2   metFORMIN (GLUCOPHAGE) 500 MG tablet Take 1 tablet (500 mg total) by mouth daily with breakfast. 90 tablet 0   Polyethylene Glycol 3350 (MIRALAX PO) Take by mouth.     Potassium Chloride ER 20 MEQ TBCR TAKE 1 TABLET BY MOUTH EVERY DAY 90 tablet 2   TART CHERRY PO Take by mouth daily.     tirzepatide (MOUNJARO) 12.5 MG/0.5ML Pen Inject 12.5 mg into the skin once a week. 2 mL 0   No current facility-administered medications for this visit.    Allergies  Allergen Reactions   Other     Other reaction(s): Other (See Comments) Instructed not to take by Cardiology.   Fentanyl Hives    Other reaction(s): hives had fentanyl recently with benadryl, did OK   Levofloxacin Hives   Midazolam Hives   Pollen Extract     seasonal   Atorvastatin      Muscle pain in legs  Other reaction(s): LEG PAIN   Doxycycline Itching and Swelling    Pt reports she is not allergic to this and doesn't know why it says itching and swelling    Hydralazine Hcl Other (See Comments)    Hypercalcemia    Rosuvastatin     Abdominal pain Other reaction(s): diarrhea    Past Medical History:  Diagnosis Date   Acute on chronic respiratory failure with hypoxia and hypercapnia ([place]) 09/15/2018   Anemia    Anxiety    Arrhythmia    tachycardia   Arthritis    Chickenpox    Chronic respiratory failure with hypoxia ([place]) 05/10/2018   Formatting of this note might be different from  the original. Last Assessment & Plan:  Continue 1 L continuous on exertion and during sleep. On her next visit we will check OSA on CPAP/room air to decide whether she needs to take oxygen along with her on her cruise in May   Depression    Diverticulitis    GERD (gastroesophageal reflux disease)    Glaucoma    Hyperlipidemia    Hyperparathyroidism ([place])    Hypertension    Inflammatory polyps of colon ([place])    LVH (left ventricular hypertrophy)    Lymphedema    PCOS (polycystic ovarian syndrome)    Prediabetes    Recurrent UTI    Sleep apnea    CPAP   Thyroid disease    Vitamin D deficiency     Blood pressure 99/65, pulse 87, resp. rate 17, height '5\' 4"'  (1.626 m), weight (!) 327 lb (148.3 kg), last menstrual period 05/02/2017, SpO2 94 %.  Essential hypertension The patients blood pressure in-clinic today was 99/65 and home averages were well controlled in the morning but slightly elevated in the evenings. The slight evening elevations are likely due to diet and activity while she is still trying to get on track with her diet plan after traveling. We will continue her current medication regimen without any changes. Her continued treatment with [facility] and her visits with healthy weight and wellness will also likely help with further blood pressure control. Her next visit with  [place] is April 24 @ 3:00  Glee Arvin PharmD candidate [facility] of Pharmacy, class of 2023  I was with student and patient for entire visit and agree with above assessment and plan  Tommy Medal PharmD CPP [place] [address] [place], [place] [address] [phone]

## 2021-06-01 NOTE — Assessment & Plan Note (Signed)
The patients blood pressure in-clinic today was 99/65 and home averages were well controlled in the morning but slightly elevated in the evenings. The slight evening elevations are likely due to diet and activity while she is still trying to get on track with her diet plan after traveling. We will continue her current medication regimen without any changes. Her continued treatment with [facility] and her visits with healthy weight and wellness will also likely help with further blood pressure control. Her next visit with [place] is April 24 @ 3:00

## 2021-06-15 ENCOUNTER — Other Ambulatory Visit: Payer: Self-pay | Admitting: Nurse Practitioner

## 2021-06-15 ENCOUNTER — Encounter: Payer: Self-pay | Admitting: Nurse Practitioner

## 2021-06-15 ENCOUNTER — Encounter: Payer: Self-pay | Admitting: Cardiology

## 2021-06-15 DIAGNOSIS — J309 Allergic rhinitis, unspecified: Secondary | ICD-10-CM

## 2021-06-15 MED ORDER — CETIRIZINE HCL 10 MG PO TABS: 10.0000 mg | ORAL_TABLET | Freq: Every day | ORAL | 1 refills | Status: DC

## 2021-06-15 MED ORDER — CLONIDINE HCL 0.1 MG PO TABS: 0.1000 mg | ORAL_TABLET | Freq: Two times a day (BID) | ORAL | 0 refills | Status: DC

## 2021-06-17 ENCOUNTER — Other Ambulatory Visit: Payer: Self-pay | Admitting: Nurse Practitioner

## 2021-06-17 DIAGNOSIS — J309 Allergic rhinitis, unspecified: Secondary | ICD-10-CM

## 2021-06-20 ENCOUNTER — Encounter (INDEPENDENT_AMBULATORY_CARE_PROVIDER_SITE_OTHER): Payer: Self-pay | Admitting: Family Medicine

## 2021-06-20 ENCOUNTER — Ambulatory Visit (INDEPENDENT_AMBULATORY_CARE_PROVIDER_SITE_OTHER): Payer: BC Managed Care – PPO | Admitting: Family Medicine

## 2021-06-20 ENCOUNTER — Other Ambulatory Visit: Payer: Self-pay

## 2021-06-20 VITALS — BP 150/88 | HR 106 | Temp 97.6°F | Ht 64.0 in | Wt 330.0 lb

## 2021-06-20 DIAGNOSIS — Z6841 Body Mass Index (BMI) 40.0 and over, adult: Secondary | ICD-10-CM | POA: Diagnosis not present

## 2021-06-20 DIAGNOSIS — E669 Obesity, unspecified: Secondary | ICD-10-CM

## 2021-06-20 DIAGNOSIS — E7849 Other hyperlipidemia: Secondary | ICD-10-CM | POA: Diagnosis not present

## 2021-06-20 NOTE — Progress Notes (Signed)
Chief Complaint:   OBESITY Madison Palmer is here to discuss her progress with her obesity treatment plan along with follow-up of her obesity related diagnoses. Madison Palmer is on keeping a food journal and adhering to recommended goals of 1600-1800 calories and 125 grams protein and states she is following her eating plan approximately 100% of the time. Madison Palmer states she is not currently exercising.  Today's visit was #: 37 Starting weight: 357 lbs Starting date: 02/27/2018 Today's weight: 330 lbs Today's date: 06/20/2021 Total lbs lost to date: 27 Total lbs lost since last in-office visit: 0  Interim History: Madison Palmer denies hunger unless she waits too long to eat. She has been dealing with some personal issues and struggling to get herself to do things. Her son now has a car and license and isn't reliant on her. Pt wants to fill void to her out of the house more. She is having some back spasms today. Pt got approved for disability. She is staying within calories daily and getting 100 grams protein in daily. She can't explain weight gain.  Subjective:   1. Other hyperlipidemia Madison Palmer is on Lipitor and her last LFT's were within normal limits.  Assessment/Plan:   1. Other hyperlipidemia Cardiovascular risk and specific lipid/LDL goals reviewed.  We discussed several lifestyle modifications today and Madison Palmer will continue to work on diet, exercise and weight loss efforts. Orders and follow up as documented in patient record. Continue Lipitor with no change in dose.  Counseling Intensive lifestyle modifications are the first line treatment for this issue. Dietary changes: Increase soluble fiber. Decrease simple carbohydrates. Exercise changes: Moderate to vigorous-intensity aerobic activity 150 minutes per week if tolerated. Lipid-lowering medications: see documented in medical record.  2. Obesity with current BMI 56.6 Madison Palmer is currently in the action stage of change. As such, her goal is to  continue with weight loss efforts. She has agreed to keeping a food journal and adhering to recommended goals of 1600-1800 calories and 125+ grams protein.   Exercise goals: All adults should avoid inactivity. Some physical activity is better than none, and adults who participate in any amount of physical activity gain some health benefits.  Behavioral modification strategies: increasing lean protein intake, meal planning and cooking strategies, keeping healthy foods in the home, and planning for success.  Madison Palmer has agreed to follow-up with our clinic in 3 weeks. She was informed of the importance of frequent follow-up visits to maximize her success with intensive lifestyle modifications for her multiple health conditions.   Objective:   Blood pressure (!) 150/88, pulse (!) 106, temperature 97.6 F (36.4 C), height 5\' 4"  (1.626 m), weight (!) 330 lb (149.7 kg), last menstrual period 05/02/2017, SpO2 96 %. Body mass index is 56.64 kg/m.  General: Cooperative, alert, well developed, in no acute distress. HEENT: Conjunctivae and lids unremarkable. Cardiovascular: Regular rhythm.  Lungs: Normal work of breathing. Neurologic: No focal deficits.   Lab Results  Component Value Date   CREATININE 1.20 (H) 05/20/2021   BUN 30 (H) 05/20/2021   NA 139 05/20/2021   K 4.3 05/20/2021   CL 96 05/20/2021   CO2 28 05/20/2021   Lab Results  Component Value Date   ALT 16 04/12/2021   AST 13 04/12/2021   ALKPHOS 70 04/12/2021   BILITOT 0.4 04/12/2021   Lab Results  Component Value Date   HGBA1C 5.6 04/12/2021   HGBA1C 5.6 06/01/2020   HGBA1C 5.7 (H) 12/11/2019   HGBA1C 5.7 (H) 12/24/2018  HGBA1C 6.0 (H) 02/27/2018   Lab Results  Component Value Date   INSULIN 18.9 06/01/2020   INSULIN 11.4 12/11/2019   INSULIN 21.1 12/30/2018   INSULIN 9.6 02/27/2018   Lab Results  Component Value Date   TSH 2.340 09/01/2020   Lab Results  Component Value Date   CHOL 202 (H) 04/12/2021   HDL  47 04/12/2021   LDLCALC 133 (H) 04/12/2021   TRIG 121 04/12/2021   CHOLHDL 4.3 04/12/2021   Lab Results  Component Value Date   VD25OH 29.0 (L) 09/01/2020   VD25OH 19.7 (L) 06/01/2020   VD25OH 36.5 12/30/2018   Lab Results  Component Value Date   WBC 5.4 09/25/2018   HGB 12.7 09/25/2018   HCT 40.4 09/25/2018   MCV 94.6 09/25/2018   PLT 229 09/25/2018   Lab Results  Component Value Date   IRON 44 02/20/2018   TIBC 347 05/29/2017    Attestation Statements:   Reviewed by clinician on day of visit: allergies, medications, problem list, medical history, surgical history, family history, social history, and previous encounter notes.  Coral Ceo, CMA, am acting as transcriptionist for Coralie Common, MD.  I have reviewed the above documentation for accuracy and completeness, and I agree with the above. - Coralie Common, MD

## 2021-06-22 ENCOUNTER — Encounter: Payer: Self-pay | Admitting: Gastroenterology

## 2021-06-22 ENCOUNTER — Ambulatory Visit (INDEPENDENT_AMBULATORY_CARE_PROVIDER_SITE_OTHER): Payer: BC Managed Care – PPO | Admitting: Gastroenterology

## 2021-06-22 VITALS — BP 122/64 | HR 98 | Ht 64.0 in | Wt 333.0 lb

## 2021-06-22 DIAGNOSIS — R12 Heartburn: Secondary | ICD-10-CM

## 2021-06-22 DIAGNOSIS — K219 Gastro-esophageal reflux disease without esophagitis: Secondary | ICD-10-CM

## 2021-06-22 DIAGNOSIS — Z6841 Body Mass Index (BMI) 40.0 and over, adult: Secondary | ICD-10-CM

## 2021-06-22 DIAGNOSIS — Z86010 Personal history of colonic polyps: Secondary | ICD-10-CM

## 2021-06-22 DIAGNOSIS — E669 Obesity, unspecified: Secondary | ICD-10-CM | POA: Insufficient documentation

## 2021-06-22 MED ORDER — ESOMEPRAZOLE MAGNESIUM 40 MG PO CPDR
40.0000 mg | DELAYED_RELEASE_CAPSULE | Freq: Two times a day (BID) | ORAL | 2 refills | Status: DC
Start: 2021-06-22 — End: 2021-08-11

## 2021-06-22 NOTE — Progress Notes (Signed)
[facility] VISIT   Primary Care Provider [name], Charlene Brooke, NP [place] [address] [phone]  Patient Profile: Madison Palmer is a 56 y.o. female with a pmh significant for morbid obesity (s/p prior Lap-Band and now removed), GERD, MDD, HLD, HTN, Colon Polyps (TAs & Hyperplastic), Thyroid Disease, Diverticulosis, diabetes, PCOS, reported incomplete LES relaxation (not consistent with EGJ outflow obstruction as not [place] to classification), Pulmonary sarcoidosis, CHFpEF (2/2 Diastolic Dysfunction).  The patient presents to the [facility] for an evaluation and management of problem(s) noted below:  Problem List 1. Gastroesophageal reflux disease, unspecified whether esophagitis present   2. Pyrosis   3. Hx of adenomatous colonic polyps   4. Class 3 severe obesity with body mass index (BMI) of 50.0 to 59.9 in adult, unspecified obesity type, unspecified whether serious comorbidity present ([place])      History of Present Illness: Please see prior notes for full details of HPI.  Interval History This is a follow-up for our patient.  I had last seen her in 2020 around the time of COVID beginning and subsequently a few months later the patient ended up moving back to [place].  She underwent a colonoscopy in their facility and was found to have a small 3 mm tubular adenoma that was removed.  She was told to follow-up in 7 years due to her prior history and the findings of her polyp.  The patient returned to living [place] at the end of 2021.  The patient continues to have relatively well-controlled GERD.  However, she does require twice daily PPI in an effort of getting things relatively controlled.  She will occasionally have some breakthrough heartburn but this is quite rare and she does not need to do anything further for this other than wait it out.  She takes her morning dose before breakfast but due to the large  amount of medications she takes in the evening she does not take her evening dose until bedtime.  The patient denies any dysphagia symptoms.  The patient is not having any abdominal pain or discomfort or alteration of her bowel habits.  The patient has been working with healthy weight management and has lost 27 pounds in the last few months and her goal is even further.  She believes she can do this.  GI Review of Systems Positive as above Negative for odynophagia, nausea, vomiting, altered bowel habits, melena, hematochezia   Review of Systems General: Denies fevers/chills unintentional weight loss Cardiovascular: Denies chest pain Pulmonary: SOB is stable Gastroenterological: See HPI Genitourinary: Denies darkened urine Dermatological: No jaundice  Psychological: Mood is stable   Medications Current Outpatient Medications  Medication Sig Dispense Refill   acetaminophen (TYLENOL) 500 MG tablet Take 1,000 mg by mouth 3 (three) times daily as needed for moderate pain or headache.     amLODipine (NORVASC) 10 MG tablet Take 1 tablet (10 mg total) by mouth daily. 90 tablet 3   atorvastatin (LIPITOR) 20 MG tablet Take 1 tablet (20 mg total) by mouth 3 (three) times a week. 12 tablet 5   cetirizine (ZYRTEC ALLERGY) 10 MG tablet Take 1 tablet (10 mg total) by mouth daily. 90 tablet 1   chlorthalidone (HYGROTON) 25 MG tablet Take 1 tablet (25 mg total) by mouth daily. 90 tablet 3   Cholecalciferol (VITAMIN D3 MAXIMUM STRENGTH) 125 MCG (5000 UT) capsule Take 5,000 Units by mouth daily.     cloNIDine (CATAPRES) 0.1 MG tablet Take 1 tablet (  0.1 mg total) by mouth 2 (two) times daily. 180 tablet 0   divalproex (DEPAKOTE) 250 MG DR tablet Take 1 tablet (250 mg total) by mouth at bedtime. 30 tablet 2   fenofibrate (TRICOR) 145 MG tablet Take 1 tablet (145 mg total) by mouth daily. 90 tablet 3   fluticasone (FLONASE) 50 MCG/ACT nasal spray Place 2 sprays into both nostrils daily.     furosemide (LASIX)  40 MG tablet TAKE 1 TABLET(40 MG) BY MOUTH DAILY 90 tablet 3   gabapentin (NEURONTIN) 100 MG capsule Take 2 capsules (200 mg total) by mouth at bedtime. 180 capsule 3   labetalol (NORMODYNE) 300 MG tablet Take 1.5 tablets (450 mg total) by mouth 2 (two) times daily. 270 tablet 3   levothyroxine (SYNTHROID) 75 MCG tablet Take 1 tablet (75 mcg total) by mouth daily. 90 tablet 0   lisinopril (ZESTRIL) 40 MG tablet Take 1 tablet (40 mg total) by mouth daily. 90 tablet 3   lurasidone (LATUDA) 40 MG TABS tablet Take 1 tablet (40 mg total) by mouth daily with breakfast. 30 tablet 2   metFORMIN (GLUCOPHAGE) 500 MG tablet Take 1 tablet (500 mg total) by mouth daily with breakfast. 90 tablet 0   Polyethylene Glycol 3350 (MIRALAX PO) Take by mouth.     Potassium Chloride ER 20 MEQ TBCR TAKE 1 TABLET BY MOUTH EVERY DAY 90 tablet 2   TART CHERRY PO Take by mouth daily.     tirzepatide (MOUNJARO) 12.5 MG/0.5ML Pen Inject 12.5 mg into the skin once a week. 2 mL 0   esomeprazole (NEXIUM) 40 MG capsule Take 1 capsule (40 mg total) by mouth 2 (two) times daily. 180 capsule 2   No current facility-administered medications for this visit.    Allergies Allergies  Allergen Reactions   Other     Other reaction(s): Other (See Comments) Instructed not to take by Cardiology.   Fentanyl Hives    Other reaction(s): hives had fentanyl recently with benadryl, did OK   Levofloxacin Hives   Midazolam Hives   Pollen Extract     seasonal   Atorvastatin     Muscle pain in legs  Other reaction(s): LEG PAIN   Doxycycline Itching and Swelling    Pt reports she is not allergic to this and doesn't know why it says itching and swelling    Hydralazine Hcl Other (See Comments)    Hypercalcemia    Rosuvastatin     Abdominal pain Other reaction(s): diarrhea    Histories Past Medical History:  Diagnosis Date   Acute on chronic respiratory failure with hypoxia and hypercapnia (HCC) 09/15/2018   Anemia    Anxiety     Arrhythmia    tachycardia   Arthritis    Chickenpox    Chronic respiratory failure with hypoxia ([place]) 05/10/2018   Formatting of this note might be different from the original. Last Assessment & Plan:  Continue 1 L continuous on exertion and during sleep. On her next visit we will check OSA on CPAP/room air to decide whether she needs to take oxygen along with her on her cruise in May   Depression    Diverticulitis    GERD (gastroesophageal reflux disease)    Glaucoma    Hyperlipidemia    Hyperparathyroidism ([place])    Hypertension    Inflammatory polyps of colon ([place])    LVH (left ventricular hypertrophy)    Lymphedema    PCOS (polycystic ovarian syndrome)    Prediabetes  Recurrent UTI    Sleep apnea    CPAP   Thyroid disease    Vitamin D deficiency    Past Surgical History:  Procedure Laterality Date   BREAST BIOPSY  2015   CESAREAN SECTION  2004   INNER EAR SURGERY     ear and sinus surgery   LAPAROSCOPIC REPAIR AND REMOVAL OF GASTRIC BAND     OOPHORECTOMY Left    TONSILLECTOMY AND ADENOIDECTOMY     Social History   Socioeconomic History   Marital status: Married    Spouse name: Not on file   Number of children: 1   Years of education: Not on file   Highest education level: Not on file  Occupational History   Occupation: Unemployed  Tobacco Use   Smoking status: Never   Smokeless tobacco: Never  Vaping Use   Vaping Use: Never used  Substance and Sexual Activity   Alcohol use: Yes    Comment: social   Drug use: No   Sexual activity: Not Currently  Other Topics Concern   Not on file  Social History Narrative   Lives with son    Currently separated from spouse   Left handed   Caffeine: coffee daily: half-caff 3 cups/day and decaf maybe 3-5 cups/day, occasional mtn dew, tea 1-2 times per week   Social Determinants of Health   Financial Resource Strain: Not on file  Food Insecurity: Not on file  Transportation Needs: Not on file  Physical Activity: Not  on file  Stress: Not on file  Social Connections: Not on file  Intimate Partner Violence: Not on file   Family History  Adopted: Yes  Problem Relation Age of Onset   Hearing loss Son        right    Healthy Son    I have reviewed her medical, social, and family history in detail and updated the electronic medical record as necessary.    PHYSICAL EXAMINATION  BP 122/64 (BP Location: Left Arm, Patient Position: Sitting, Cuff Size: Normal)    Pulse 98    Ht 5\' 4"  (1.626 m)    Wt (!) 333 lb (151 kg)    LMP 05/02/2017    SpO2 97%    BMI 57.16 kg/m  GEN: NAD, appears stated age, doesn't appear chronically ill PSYCH: Cooperative, without pressured speech EYE: Conjunctivae pink, sclerae anicteric ENT: MMM, without oral ulcers, no erythema or exudates noted NECK: Supple CV: RR without R/Gs  RESP: CTAB posteriorly, without wheezing GI: NABS, soft, NT/ND, without rebound or guarding, no HSM appreciated GU: DRE shows MSK/EXT: _ edema, no palmar erythema SKIN: No jaundice, no spider angiomata, no concerning rashes NEURO:  Alert & Oriented x 3, no focal deficits, no evidence of asterixis    REVIEW OF DATA  I reviewed the following data at the time of this encounter:  GI Procedures and Studies  2021 colonoscopy outside facility Diverticulosis left-sided Rectosigmoid polyp removed 3 mm returned as TA Hemorrhoids  These results will be scanned into the chart Laboratory Studies  Reviewed in epic  Imaging Studies  No relevant studies   ASSESSMENT  Ms. Viereck is a 56 y.o. female with a pmh significant for morbid obesity (s/p prior Lap-Band and now removed), GERD, MDD, HLD, HTN, Colon Polyps (TAs & Hyperplastic), Thyroid Disease, Diverticulosis, diabetes, PCOS, reported incomplete LES relaxation (not consistent with EGJ outflow obstruction as not [place] to classification), Pulmonary sarcoidosis, CHFpEF (2/2 Diastolic Dysfunction).  The patient is seen today for evaluation  and  management of:  1. Gastroesophageal reflux disease, unspecified whether esophagitis present   2. Pyrosis   3. Hx of adenomatous colonic polyps   4. Class 3 severe obesity with body mass index (BMI) of 50.0 to 59.9 in adult, unspecified obesity type, unspecified whether serious comorbidity present [facility])    The patient is hemodynamically and clinically stable at this time.  She continues on her twice daily PPI therapy.  I think if she continues to lose weight she may have some clinical improvement with this.  Thankfully she is not having progressive symptoms.  I do think that an upper endoscopy may be needed in the future though thankfully she is not having any overt dysphagia.  If her symptoms progress or worsen or any new dysphagia symptoms develop we will plan to proceed with endoscopy in the hospital-based setting.  If the patient feels that things are doing well and are stable, then I recommend in a few months trying to decrease her PPI to once daily.  She will continue her lifestyle modifications as able and continue to try and lose weight.  The patient underwent her colonoscopy for colon polyp surveillance in 2021 and pending her health and medical comorbidities in 2028 will be due for her 7-year follow-up (we will place a recall today).  All patient questions were answered to the best of my ability, and the patient agrees to the aforementioned plan of action with follow-up as indicated.   PLAN  Continue Nexium 40 mg twice daily - If patient doing well consider decreasing to once daily dosing Lifestyle modifications for GERD previously discussed were rediscussed If at any time point patient develops dysphagia or progressive GERD symptoms will need to consider endoscopy If dysphagia symptoms progress and endoscopy and dilation are performed will need to consider manometry Colonoscopy for surveillance in 2028 (pending patient's medical issues at the time)   No orders of the defined types were  placed in this encounter.    Modified Medications   Modified Medication Previous Medication   ESOMEPRAZOLE (NEXIUM) 40 MG CAPSULE esomeprazole (NEXIUM) 40 MG capsule      Take 1 capsule (40 mg total) by mouth 2 (two) times daily.    Take 1 capsule (40 mg total) by mouth 2 (two) times daily.    Planned Follow Up: No follow-ups on file.   Total Time in Face-to-Face and in Coordination of Care for patient including independent/personal interpretation/review of prior testing, medical history, examination, medication adjustment, communicating results with the patient directly, and documentation within the EHR is 25 minutes.   Justice Britain, MD [facility] Office # [phone]

## 2021-06-22 NOTE — Patient Instructions (Addendum)
We have sent the following medications to your pharmacy for you to pick up at your convenience: Esomeprazole   You will be due for a recall colonoscopy in 2028. We will send you a reminder in the mail when it gets closer to that time.  Follow up in 1 year. Sooner if needed.   If you are age 56 or older, your body mass index should be between 23-30. Your Body mass index is 57.16 kg/m. If this is out of the aforementioned range listed, please consider follow up with your Primary Care Provider.  If you are age 7 or younger, your body mass index should be between 19-25. Your Body mass index is 57.16 kg/m. If this is out of the aformentioned range listed, please consider follow up with your Primary Care Provider.   ________________________________________________________  The [facility] providers would like to encourage you to use [facility] to communicate with providers for non-urgent requests or questions.  Due to long hold times on the telephone, sending your provider a message by [facility] may be a faster and more efficient way to get a response.  Please allow 48 business hours for a response.  Please remember that this is for non-urgent requests.  _______________________________________________________, Thank you for choosing me and [facility].  Dr. Rush Landmark

## 2021-06-23 ENCOUNTER — Encounter (INDEPENDENT_AMBULATORY_CARE_PROVIDER_SITE_OTHER): Payer: Self-pay | Admitting: Family Medicine

## 2021-06-23 NOTE — Telephone Encounter (Signed)
Dr. U

## 2021-06-27 ENCOUNTER — Encounter (INDEPENDENT_AMBULATORY_CARE_PROVIDER_SITE_OTHER): Payer: Self-pay | Admitting: Family Medicine

## 2021-06-27 ENCOUNTER — Other Ambulatory Visit: Payer: Self-pay | Admitting: Cardiology

## 2021-06-27 DIAGNOSIS — I152 Hypertension secondary to endocrine disorders: Secondary | ICD-10-CM

## 2021-06-28 NOTE — Telephone Encounter (Signed)
Please advise 

## 2021-07-04 ENCOUNTER — Other Ambulatory Visit (HOSPITAL_COMMUNITY): Payer: Self-pay | Admitting: Psychiatry

## 2021-07-04 DIAGNOSIS — F319 Bipolar disorder, unspecified: Secondary | ICD-10-CM

## 2021-07-07 ENCOUNTER — Other Ambulatory Visit (INDEPENDENT_AMBULATORY_CARE_PROVIDER_SITE_OTHER): Payer: Self-pay | Admitting: Family Medicine

## 2021-07-07 ENCOUNTER — Encounter (INDEPENDENT_AMBULATORY_CARE_PROVIDER_SITE_OTHER): Payer: Self-pay | Admitting: Family Medicine

## 2021-07-07 DIAGNOSIS — E1169 Type 2 diabetes mellitus with other specified complication: Secondary | ICD-10-CM

## 2021-07-07 MED ORDER — TIRZEPATIDE 12.5 MG/0.5ML ~~LOC~~ SOAJ: 12.5000 mg | SUBCUTANEOUS | 0 refills | Status: DC

## 2021-07-07 NOTE — Telephone Encounter (Signed)
Please advise 

## 2021-07-08 NOTE — Telephone Encounter (Signed)
FYI

## 2021-07-11 ENCOUNTER — Other Ambulatory Visit (HOSPITAL_COMMUNITY): Payer: Self-pay | Admitting: Psychiatry

## 2021-07-11 ENCOUNTER — Encounter (INDEPENDENT_AMBULATORY_CARE_PROVIDER_SITE_OTHER): Payer: Self-pay | Admitting: Family Medicine

## 2021-07-11 ENCOUNTER — Other Ambulatory Visit (INDEPENDENT_AMBULATORY_CARE_PROVIDER_SITE_OTHER): Payer: Self-pay

## 2021-07-11 ENCOUNTER — Encounter (INDEPENDENT_AMBULATORY_CARE_PROVIDER_SITE_OTHER): Payer: Self-pay

## 2021-07-11 DIAGNOSIS — E1169 Type 2 diabetes mellitus with other specified complication: Secondary | ICD-10-CM

## 2021-07-11 DIAGNOSIS — F319 Bipolar disorder, unspecified: Secondary | ICD-10-CM

## 2021-07-11 DIAGNOSIS — F419 Anxiety disorder, unspecified: Secondary | ICD-10-CM

## 2021-07-11 MED ORDER — TIRZEPATIDE 12.5 MG/0.5ML ~~LOC~~ SOAJ: 12.5000 mg | SUBCUTANEOUS | 0 refills | Status: DC

## 2021-07-12 ENCOUNTER — Encounter (HOSPITAL_COMMUNITY): Payer: Self-pay | Admitting: Psychiatry

## 2021-07-12 ENCOUNTER — Telehealth (HOSPITAL_BASED_OUTPATIENT_CLINIC_OR_DEPARTMENT_OTHER): Payer: BC Managed Care – PPO | Admitting: Psychiatry

## 2021-07-12 ENCOUNTER — Other Ambulatory Visit: Payer: Self-pay

## 2021-07-12 VITALS — Wt 328.0 lb

## 2021-07-12 DIAGNOSIS — F319 Bipolar disorder, unspecified: Secondary | ICD-10-CM | POA: Diagnosis not present

## 2021-07-12 DIAGNOSIS — F419 Anxiety disorder, unspecified: Secondary | ICD-10-CM | POA: Diagnosis not present

## 2021-07-12 MED ORDER — LURASIDONE HCL 40 MG PO TABS: 40.0000 mg | ORAL_TABLET | Freq: Every day | ORAL | 2 refills | Status: DC

## 2021-07-12 MED ORDER — DIVALPROEX SODIUM 125 MG PO DR TAB: 125.0000 mg | DELAYED_RELEASE_TABLET | Freq: Every day | ORAL | 2 refills | Status: DC

## 2021-07-12 NOTE — Progress Notes (Signed)
Virtual Visit via Telephone Note ? ?I connected with Madison Palmer on 07/12/21 at 10:20 AM EDT by telephone and verified that I am speaking with the correct person using two identifiers. ? ?Location: ?Patient: Home ?Provider: Home Office ?  ?I discussed the limitations, risks, security and privacy concerns of performing an evaluation and management service by telephone and the availability of in person appointments. I also discussed with the patient that there may be a patient responsible charge related to this service. The patient expressed understanding and agreed to proceed. ? ? ?History of Present Illness: ?Patient is evaluated by phone session.  We have cut down her Latuda and now she is taking only 40 mg because of the tremors.  She noticed her tremors are improved but she is still have mild tremors during the day.  She admitted lack of motivation to do things and sometimes she feels tired but denies any feeling of hopelessness or suicidal thoughts.  She feels since her son is now driving and does not need her to do things that may be contributing to her sadness.  She does not feel that she need to go outside since son is doing a lot of things.  We have recommended to try walking but due to knee pain it has been difficult for her to walk.  She is with a weight management program.  She admitted some nights she sleeps too much because she has nothing to do.  She denies any mania, psychosis, hallucination, paranoia.  She denies any anger.  She denies any highs and lows.  She lost few pounds since the last visit.  She denies any panic attack.  She denies drinking or using any illegal substances. ? ? ?Past psychiatric history; ?H/O depression and inpatient at [facility] in May 20202 for delusion, psychosis and violent behavior. D/C on Depakote 500 mg BID, Latuda 60 mg daily and Trilafon 12 mg a day. No h/o suicidal attempt, nightmares, panic attack or OCD symptoms.  No h/o drinking and drug  use.  ? ?Psychiatric Specialty Exam: ?Physical Exam  ?Review of Systems  ?Weight (!) 328 lb (148.8 kg), last menstrual period 05/02/2017.There is no height or weight on file to calculate BMI.  ?General Appearance: NA  ?Eye Contact:  NA  ?Speech:  Clear and Coherent and Normal Rate  ?Volume:  Normal  ?Mood:  Euthymic  ?Affect:  NA  ?Thought Process:  Goal Directed  ?Orientation:  Full (Time, Place, and Person)  ?Thought Content:  Rumination  ?Suicidal Thoughts:  No  ?Homicidal Thoughts:  No  ?Memory:  Immediate;   Good ?Recent;   Good ?Remote;   Good  ?Judgement:  Intact  ?Insight:  Present  ?Psychomotor Activity:  Tremor  ?Concentration:  Concentration: Good and Attention Span: Good  ?Recall:  Good  ?Fund of Knowledge:  Good  ?Language:  Good  ?Akathisia:  No  ?Handed:  Right  ?AIMS (if indicated):     ?Assets:  Communication Skills ?Desire for Improvement ?Housing ?Transportation  ?ADL's:  Intact  ?Cognition:  WNL  ?Sleep:   10 hrs  ? ? ? ? ?Assessment and Plan: ?Bipolar disorder type I.  Anxiety. ? ?I reviewed blood work results.  Her BUN is 30 and creatinine 1.20.  Her last hemoglobin A1c is 5.6.  Discussed hydration, try to walk if possible.  She endorsed sleeping too much and sometimes has lack of motivation to do things.  She also have mild tremors.  I recommend  trying reducing the Depakote to take only 125 mg at bedtime.  That may help her tremors and energy level.  We will continue Latuda 40 mg.  I reminded if reducing the Depakote because symptoms to come back then she should call us immediately.  Patient agrees with the plan.  Encouraged to continue therapy.  Follow up in 3 months. ? ? ? ?Follow Up Instructions: ? ?  ?I discussed the assessment and treatment plan with the patient. The patient was provided an opportunity to ask questions and all were answered. The patient agreed with the plan and demonstrated an understanding of the instructions. ?  ?The patient was advised to call back or seek an in-person  evaluation if the symptoms worsen or if the condition fails to improve as anticipated. ? ?I provided 23 minutes of non-face-to-face time during this encounter. ? ? ?Kathlee Nations, MD  ?

## 2021-07-13 ENCOUNTER — Other Ambulatory Visit: Payer: Self-pay | Admitting: Family Medicine

## 2021-07-14 ENCOUNTER — Ambulatory Visit (INDEPENDENT_AMBULATORY_CARE_PROVIDER_SITE_OTHER): Payer: BC Managed Care – PPO | Admitting: Family Medicine

## 2021-07-25 ENCOUNTER — Encounter (INDEPENDENT_AMBULATORY_CARE_PROVIDER_SITE_OTHER): Payer: Self-pay | Admitting: Family Medicine

## 2021-07-25 ENCOUNTER — Other Ambulatory Visit: Payer: Self-pay

## 2021-07-25 ENCOUNTER — Ambulatory Visit (INDEPENDENT_AMBULATORY_CARE_PROVIDER_SITE_OTHER): Payer: BC Managed Care – PPO | Admitting: Family Medicine

## 2021-07-25 DIAGNOSIS — Z6841 Body Mass Index (BMI) 40.0 and over, adult: Secondary | ICD-10-CM | POA: Diagnosis not present

## 2021-07-25 DIAGNOSIS — R7303 Prediabetes: Secondary | ICD-10-CM

## 2021-07-25 DIAGNOSIS — E669 Obesity, unspecified: Secondary | ICD-10-CM

## 2021-07-25 MED ORDER — TIRZEPATIDE 10 MG/0.5ML ~~LOC~~ SOAJ: 10.0000 mg | SUBCUTANEOUS | 0 refills | Status: DC

## 2021-07-26 ENCOUNTER — Ambulatory Visit: Payer: BC Managed Care – PPO | Admitting: Pulmonary Disease

## 2021-07-26 ENCOUNTER — Encounter: Payer: Self-pay | Admitting: Pulmonary Disease

## 2021-07-26 ENCOUNTER — Encounter (INDEPENDENT_AMBULATORY_CARE_PROVIDER_SITE_OTHER): Payer: Self-pay | Admitting: Family Medicine

## 2021-07-26 DIAGNOSIS — G4733 Obstructive sleep apnea (adult) (pediatric): Secondary | ICD-10-CM | POA: Diagnosis not present

## 2021-07-26 DIAGNOSIS — J9611 Chronic respiratory failure with hypoxia: Secondary | ICD-10-CM | POA: Diagnosis not present

## 2021-07-26 NOTE — Patient Instructions (Signed)
?  X ONO on CPAP/RA ? ?CPAP is working well ?

## 2021-07-26 NOTE — Assessment & Plan Note (Signed)
Stable for at least 2 years and further CT imaging is not necessary. ?Attributed to sarcoidosis. ?We will follow-up with chest x-ray on an annual basis ?

## 2021-07-26 NOTE — Progress Notes (Signed)
? ?  Subjective:  ? ? Patient ID: Madison Palmer, female    DOB: November 15, 1965, 55 y.o.   MRN: [id number] ? ?HPI ? ?56 yo morbidly obese woman with chronic lymphedema  for FU of severe obstructive sleep apnea & pulm nodules. Noted 05/2018 ?  ?  ?She underwent lap band in 2009 and lost weight and CPAP was then decreased to 9 cm in 06/2009.  lap band was removed in 2013.  ?PMH - laryngospasm with prior surgery ?Chronic diastolic heart failure ? ?Annual follow-up visit ?She is still struggling with depression after her nervous breakdown in 2021 .  She is compliant with Lasix 40 mg daily. ?She is taking Zyrtec during the pollen season. ?She is cut down her dose of Depakote. ?She is very compliant with her CPAP machine and denies any problems with mask or pressure. ?She is short of breath on walking and has occasionally found her saturation to drop to the high 80s but recovers very quickly.  She would like to avoid portable oxygen. ?She does have an oxygen concentrator at home but she has not been using this, previously this was blended into the CPAP ? ?We reviewed CT chest from 07/2020 ? ? ?Significant tests/ events reviewed ? ? 12/2018 ONO showed SpO2 low 85%, baseline 91%. 8 mins <88% >>  continue O2 blended into CPAP ? ?PFTs severe restriction due to obesity ?01/2020-FVC 1.15 (34%), FEV1 0.88 (33%), ratio 76, DLCOco 162% ? ?CT chest 07/2020 multiple pulmonary nodules, all stable ?CT chest 01/2020  stable pulmonary nodules, favoring benign cause ?CT angiogram 05/10/18  was negative for pulmonary emboli, but showed numerous pulmonary nodules largest 15 mm in the right lower lobe and 13 mm in the lingula. ?Mosaic attenuation was also noted  suggesting air trapping   ?  ?PET 05/24/18  low-grade hypermetabolism SUV 3-4 range in the nodules.  Mediastinum did not show any hypermetabolic ?  ?  ?prior CT chest from 2010 and 2011  >> shows multiple noncalcified pulmonary nodules 3 to 6 mm, largest being 6 mm  ?  ?NP SG 2008 showed AHI of  100/hour which was corrected by CPAP of 14 cm.    ?  ?rheumatology evaluation >> positive ANA and positive RA factor but negative CCP and no evidence of synovitis , ENA neg ?ACE 40 ? ?Review of Systems ?neg for any significant sore throat, dysphagia, itching, sneezing, nasal congestion or excess/ purulent secretions, fever, chills, sweats, unintended wt loss, pleuritic or exertional cp, hempoptysis, orthopnea pnd or change in chronic leg swelling. Also denies presyncope, palpitations, heartburn, abdominal pain, nausea, vomiting, diarrhea or change in bowel or urinary habits, dysuria,hematuria, rash, arthralgias, visual complaints, headache, numbness weakness or ataxia. ? ?   ?Objective:  ? Physical Exam ? ?Gen. Pleasant, obese, in no distress ?ENT - no lesions, no post nasal drip ?Neck: No JVD, no thyromegaly, no carotid bruits ?Lungs: no use of accessory muscles, no dullness to percussion, decreased without rales or rhonchi  ?Cardiovascular: Rhythm regular, heart sounds  normal, no murmurs or gallops, no peripheral edema ?Musculoskeletal: No deformities, no cyanosis or clubbing , no tremors ? ? ? ?   ?Assessment & Plan:  ? ? ?

## 2021-07-26 NOTE — Assessment & Plan Note (Signed)
CPAP download was reviewed which shows excellent compliance on 9 cm, no missed nights and good control of events with minimal leak. ?She is very compliant and CPAP is only helped improve her daytime somnolence and fatigue ?CPAP supplies will be renewed for a year. ? ? ?Weight loss encouraged, compliance with goal of at least 4-6 hrs every night is the expectation. ?Advised against medications with sedative side effects ?Cautioned against driving when sleepy - understanding that sleepiness will vary on a day to day basis ? ?

## 2021-07-26 NOTE — Assessment & Plan Note (Signed)
Previous nocturnal oximetry has shown desaturations and it was recommended on oxygen blended into her CPAP machine. ?We will recheck nocturnal oximetry on CPAP/room air to reassess. ?She does seem to desaturate on ambulation but would like to avoid portable oxygen.  As long as she recovers I am okay with this plan.  Her hypoxia seems to be on the basis of diastolic heart failure ?

## 2021-07-27 NOTE — Progress Notes (Signed)
? ? ? ?Chief Complaint:  ? ?OBESITY ?Madison Palmer is here to discuss her progress with her obesity treatment plan along with follow-up of her obesity related diagnoses. Madison Palmer is on keeping a food journal and adhering to recommended goals of 1600-1800 calories and 125+ grams of protein daily and states she is following her eating plan approximately 100% of the time. Madison Palmer states she is doing 0 minutes 0 times per week. ? ?Today's visit was #: 47 ?Starting weight: 357 lbs ?Starting date: 02/27/2018 ?Today's weight: 337 lbs ?Today's date: 07/25/2021 ?Total lbs lost to date: 62 ?Total lbs lost since last in-office visit: 0 ? ?Interim History: Madison Palmer is journaling 100% of the time. She is experiencing pain with movement. Knee is bothering her and unfortunately she is not due for gel injection for another 6 months. She is noticing significant achiness in her legs. She had restarted Lipitor 3 times per week, but not sure if it is contributing or if possibly Cinacalcet is a contributor. She does maybe think she can decrease visit to [facility]. She stays within her calories 5-6 out of 7 days.  ? ?Subjective:  ? ?1. Hypercalcemia ?Madison Palmer is on Cinacalcet due to parathyroidsim. She has labs to get done tomorrow, PTH and renal panel. ? ?2. Pre-diabetes ?Madison Palmer is on statin, Mounjaro, and Glucophage. Unfortunately she hasn't been able to get [facility] due to drug shortage.  ? ?Assessment/Plan:  ? ?1. Hypercalcemia ?Madison Palmer will continue to follow up with Dr. Garrel Ridgel. ? ?2. Pre-diabetes ?Madison Palmer agreed to decrease Mounjaro to 10 mg SubQ week, and we will refill for 1 month. ? ?- tirzepatide (MOUNJARO) 10 MG/0.5ML Pen; Inject 10 mg into the skin once a week.  Dispense: 2 mL; Refill: 0 ? ?3. Obesity with current BMI 57.8 ?Madison Palmer is currently in the action stage of change. As such, her goal is to continue with weight loss efforts. She has agreed to keeping a food journal and adhering to recommended goals of 1600-1800 calories and 125+ grams  of protein daily.  ? ?Exercise goals: All adults should avoid inactivity. Some physical activity is better than none, and adults who participate in any amount of physical activity gain some health benefits. ? ?Behavioral modification strategies: increasing lean protein intake, meal planning and cooking strategies, keeping healthy foods in the home, and planning for success. ? ?Madison Palmer has agreed to follow-up with our clinic in 3 weeks. She was informed of the importance of frequent follow-up visits to maximize her success with intensive lifestyle modifications for her multiple health conditions.  ? ?Objective:  ? ?Blood pressure 126/78, pulse 80, temperature 97.9 ?F (36.6 ?C), height '5\' 4"'$  (1.626 m), weight (!) 337 lb (152.9 kg), last menstrual period 05/02/2017, SpO2 97 %. ?Body mass index is 57.85 kg/m?. ? ?General: Cooperative, alert, well developed, in no acute distress. ?HEENT: Conjunctivae and lids unremarkable. ?Cardiovascular: Regular rhythm.  ?Lungs: Normal work of breathing. ?Neurologic: No focal deficits.  ? ?Lab Results  ?Component Value Date  ? CREATININE 1.20 (H) 05/20/2021  ? BUN 30 (H) 05/20/2021  ? NA 139 05/20/2021  ? K 4.3 05/20/2021  ? CL 96 05/20/2021  ? CO2 28 05/20/2021  ? ?Lab Results  ?Component Value Date  ? ALT 16 04/12/2021  ? AST 13 04/12/2021  ? ALKPHOS 70 04/12/2021  ? BILITOT 0.4 04/12/2021  ? ?Lab Results  ?Component Value Date  ? HGBA1C 5.6 04/12/2021  ? HGBA1C 5.6 06/01/2020  ? HGBA1C 5.7 (H) 12/11/2019  ? HGBA1C 5.7 (H) 12/24/2018  ?  HGBA1C 6.0 (H) 02/27/2018  ? ?Lab Results  ?Component Value Date  ? INSULIN 18.9 06/01/2020  ? INSULIN 11.4 12/11/2019  ? INSULIN 21.1 12/30/2018  ? INSULIN 9.6 02/27/2018  ? ?Lab Results  ?Component Value Date  ? TSH 2.340 09/01/2020  ? ?Lab Results  ?Component Value Date  ? CHOL 202 (H) 04/12/2021  ? HDL 47 04/12/2021  ? LDLCALC 133 (H) 04/12/2021  ? TRIG 121 04/12/2021  ? CHOLHDL 4.3 04/12/2021  ? ?Lab Results  ?Component Value Date  ? VD25OH 29.0  (L) 09/01/2020  ? VD25OH 19.7 (L) 06/01/2020  ? VD25OH 36.5 12/30/2018  ? ?Lab Results  ?Component Value Date  ? WBC 5.4 09/25/2018  ? HGB 12.7 09/25/2018  ? HCT 40.4 09/25/2018  ? MCV 94.6 09/25/2018  ? PLT 229 09/25/2018  ? ?Lab Results  ?Component Value Date  ? IRON 44 02/20/2018  ? TIBC 347 05/29/2017  ? ?Attestation Statements:  ? ?Reviewed by clinician on day of visit: allergies, medications, problem list, medical history, surgical history, family history, social history, and previous encounter notes. ? ? ?I, Trixie Dredge, am acting as transcriptionist for Coralie Common, MD. ? ?I have reviewed the above documentation for accuracy and completeness, and I agree with the above. Coralie Common, MD ? ? ?

## 2021-07-27 NOTE — Telephone Encounter (Signed)
FYI

## 2021-07-28 ENCOUNTER — Encounter (INDEPENDENT_AMBULATORY_CARE_PROVIDER_SITE_OTHER): Payer: Self-pay | Admitting: Family Medicine

## 2021-07-28 NOTE — Telephone Encounter (Signed)
Please advise 

## 2021-08-01 ENCOUNTER — Other Ambulatory Visit (HOSPITAL_COMMUNITY): Payer: Self-pay | Admitting: Psychiatry

## 2021-08-01 DIAGNOSIS — F319 Bipolar disorder, unspecified: Secondary | ICD-10-CM

## 2021-08-01 DIAGNOSIS — F419 Anxiety disorder, unspecified: Secondary | ICD-10-CM

## 2021-08-02 ENCOUNTER — Other Ambulatory Visit (HOSPITAL_COMMUNITY): Payer: Self-pay | Admitting: Psychiatry

## 2021-08-02 DIAGNOSIS — F419 Anxiety disorder, unspecified: Secondary | ICD-10-CM

## 2021-08-02 DIAGNOSIS — F319 Bipolar disorder, unspecified: Secondary | ICD-10-CM

## 2021-08-04 ENCOUNTER — Encounter: Payer: Self-pay | Admitting: Pulmonary Disease

## 2021-08-11 ENCOUNTER — Telehealth: Payer: Self-pay | Admitting: Pulmonary Disease

## 2021-08-11 ENCOUNTER — Other Ambulatory Visit: Payer: Self-pay | Admitting: Gastroenterology

## 2021-08-11 NOTE — Telephone Encounter (Signed)
Spoke with patient  regarding ONO results. They verbalized understanding. No further questions.  Nothing further needed at this time.  

## 2021-08-11 NOTE — Telephone Encounter (Signed)
ONO on CPAP/RA showed desat < 88% x 54 mins ?COntinue 1 L O2 blended into CPAP ?

## 2021-08-15 ENCOUNTER — Encounter (INDEPENDENT_AMBULATORY_CARE_PROVIDER_SITE_OTHER): Payer: Self-pay | Admitting: Family Medicine

## 2021-08-15 ENCOUNTER — Ambulatory Visit (INDEPENDENT_AMBULATORY_CARE_PROVIDER_SITE_OTHER): Payer: BC Managed Care – PPO | Admitting: Family Medicine

## 2021-08-15 VITALS — BP 109/73 | HR 95 | Temp 97.7°F | Ht 64.0 in | Wt 335.0 lb

## 2021-08-15 DIAGNOSIS — Z6841 Body Mass Index (BMI) 40.0 and over, adult: Secondary | ICD-10-CM

## 2021-08-15 DIAGNOSIS — E213 Hyperparathyroidism, unspecified: Secondary | ICD-10-CM | POA: Diagnosis not present

## 2021-08-15 DIAGNOSIS — I10 Essential (primary) hypertension: Secondary | ICD-10-CM

## 2021-08-15 DIAGNOSIS — R7303 Prediabetes: Secondary | ICD-10-CM

## 2021-08-15 DIAGNOSIS — E669 Obesity, unspecified: Secondary | ICD-10-CM | POA: Diagnosis not present

## 2021-08-15 MED ORDER — METFORMIN HCL 500 MG PO TABS: 500.0000 mg | ORAL_TABLET | Freq: Every day | ORAL | 0 refills | Status: DC

## 2021-08-17 ENCOUNTER — Other Ambulatory Visit (INDEPENDENT_AMBULATORY_CARE_PROVIDER_SITE_OTHER): Payer: Self-pay | Admitting: Family Medicine

## 2021-08-17 DIAGNOSIS — R7303 Prediabetes: Secondary | ICD-10-CM

## 2021-08-22 ENCOUNTER — Ambulatory Visit: Payer: BC Managed Care – PPO | Admitting: Pharmacist

## 2021-08-22 VITALS — BP 118/68 | HR 87 | Resp 15 | Ht 64.0 in | Wt 335.0 lb

## 2021-08-22 DIAGNOSIS — I10 Essential (primary) hypertension: Secondary | ICD-10-CM | POA: Diagnosis not present

## 2021-08-22 NOTE — Patient Instructions (Addendum)
It was nice meeting you ? ?We would like to keep your blood pressure less than 130/80 ? ?Since you are concerned about your calcium level, we will check your lab work ? ?Do not take your chlorthalidone until you hear from [place] tomorrow ? ?Continue your: ? ?Lisinopril '40mg'$  daily ?Clonidine 0.'1mg'$  twice a day ?Furosemide '40mg'$  daily ?Amlodipine '10mg'$  daily ?Labetalol '450mg'$  twice a day ? ?We will contact you with your results ? ?Karren Cobble, PharmD, BCACP, [place], CPP ?[address], [address] ?[place], [place], [address] ?Phone: [phone], Fax: [phone]  ? ?

## 2021-08-22 NOTE — Progress Notes (Signed)
Patient ID: REECE MCBROOM                 DOB: 12-02-1965                      MRN: [id number] ? ? ? ? ?HPI: ?Madison Palmer is a 56 y.o. female referred by Dr. Percival Spanish to HTN clinic. PMH is significant for obesity, HTN, HLD, DM, biopolar disorder, obesity hyperparathyroidism, and hypercalcemia. Patient's blood pressure has been well controlled recently however she recently saw her endocrinologist at [facility] who advised her that chlorthalidone can increase calcium levels and patient is concerned. ? ?Patient presents today to discuss chlorthalidone and calcium levels.  Calcium has historically been elevated even before being on chlorthalidone. She likes taking chlorthalidone because it has effectively been controlling her BP but is concerned. ? ?Endocrinology placed her on Sensipar to reduce serum Ca levels however she has been off of it for 2 weeks now due to insurance issues.  When she was taking it Ca levels reduced to 10.1 on 3/28 per [facility] labs.   ? ?Recent home BP readings ? ?4/23: 130/89, 132,68 ?4/16: 129/86, 131/75 ?4/9: 103/74, 116/79 ?4/2: 98/61, 112/71 ?3/26: 98/73, 124/75 ? ?Patient has also recently discontinued Mounjaro because she was not losing weight and noticed no decrease in appetite.  Is going to pursue bariatric surgery. ? ?Current HTN meds:  ?Lisinopril '40mg'$  daily ?Clonidine 0.'1mg'$  BID ?Furosemide '40mg'$  daily ?Chlorthalidone '25mg'$  daily ?Amlodipine '10mg'$  daily ?Labetalol '450mg'$  BID ? ?BP goal: <130/80 ? ? ?Wt Readings from Last 3 Encounters:  ?08/15/21 (!) 335 lb (152 kg)  ?07/26/21 (!) 340 lb 12.8 oz (154.6 kg)  ?07/25/21 (!) 337 lb (152.9 kg)  ? ?BP Readings from Last 3 Encounters:  ?08/15/21 109/73  ?07/26/21 124/60  ?07/25/21 126/78  ? ?Pulse Readings from Last 3 Encounters:  ?08/15/21 95  ?07/26/21 91  ?07/25/21 80  ? ? ?Renal function: ?CrCl cannot be calculated (Patient's most recent lab result is older than the maximum 21 days allowed.). ? ?Past Medical History:  ?Diagnosis Date  ? Acute on  chronic respiratory failure with hypoxia and hypercapnia ([place]) 09/15/2018  ? Anemia   ? Anxiety   ? Arrhythmia   ? tachycardia  ? Arthritis   ? Chickenpox   ? Chronic respiratory failure with hypoxia ([place]) 05/10/2018  ? Formatting of this note might be different from the original. Last Assessment & Plan:  Continue 1 L continuous on exertion and during sleep. On her next visit we will check OSA on CPAP/room air to decide whether she needs to take oxygen along with her on her cruise in May  ? Depression   ? Diverticulitis   ? GERD (gastroesophageal reflux disease)   ? Glaucoma   ? Hyperlipidemia   ? Hyperparathyroidism ([place])   ? Hypertension   ? Inflammatory polyps of colon ([place])   ? LVH (left ventricular hypertrophy)   ? Lymphedema   ? PCOS (polycystic ovarian syndrome)   ? Prediabetes   ? Recurrent UTI   ? Sleep apnea   ? CPAP  ? Thyroid disease   ? Vitamin D deficiency   ? ? ?Current Outpatient Medications on File Prior to Visit  ?Medication Sig Dispense Refill  ? divalproex (DEPAKOTE) 250 MG DR tablet Take by mouth.    ? tirzepatide (MOUNJARO) 12.5 MG/0.5ML Pen Inject into the skin.    ? acetaminophen (TYLENOL) 500 MG tablet Take 1,000 mg by mouth 3 (three) times daily  as needed for moderate pain or headache.    ? amLODipine (NORVASC) 10 MG tablet Take 1 tablet (10 mg total) by mouth daily. 90 tablet 3  ? atorvastatin (LIPITOR) 20 MG tablet Take 1 tablet (20 mg total) by mouth 3 (three) times a week. 12 tablet 5  ? cetirizine (ZYRTEC ALLERGY) 10 MG tablet Take 1 tablet (10 mg total) by mouth daily. 90 tablet 1  ? chlorthalidone (HYGROTON) 25 MG tablet Take 1 tablet (25 mg total) by mouth daily. 90 tablet 3  ? Cholecalciferol (VITAMIN D3 MAXIMUM STRENGTH) 125 MCG (5000 UT) capsule Take 5,000 Units by mouth daily.    ? cinacalcet (SENSIPAR) 30 MG tablet Take 30 mg by mouth daily.    ? cloNIDine (CATAPRES) 0.1 MG tablet Take 1 tablet (0.1 mg total) by mouth 2 (two) times daily. 180 tablet 0  ? divalproex (DEPAKOTE) 125  MG DR tablet Take 1 tablet (125 mg total) by mouth at bedtime. 30 tablet 2  ? esomeprazole (NEXIUM) 40 MG capsule TAKE 1 CAPSULE BY MOUTH TWICE DAILY 180 capsule 2  ? fenofibrate (TRICOR) 145 MG tablet Take 1 tablet (145 mg total) by mouth daily. 90 tablet 3  ? fluticasone (FLONASE) 50 MCG/ACT nasal spray Place 2 sprays into both nostrils daily.    ? furosemide (LASIX) 40 MG tablet TAKE 1 TABLET(40 MG) BY MOUTH DAILY 90 tablet 3  ? gabapentin (NEURONTIN) 100 MG capsule TAKE 2 CAPSULES(200 MG) BY MOUTH AT BEDTIME 180 capsule 3  ? labetalol (NORMODYNE) 300 MG tablet Take 1.5 tablets (450 mg total) by mouth 2 (two) times daily. 270 tablet 3  ? levothyroxine (SYNTHROID) 88 MCG tablet Take 88 mcg by mouth daily.    ? lisinopril (ZESTRIL) 40 MG tablet TAKE 1 TABLET(40 MG) BY MOUTH DAILY 90 tablet 3  ? lurasidone (LATUDA) 40 MG TABS tablet Take 1 tablet (40 mg total) by mouth daily with breakfast. 30 tablet 2  ? metFORMIN (GLUCOPHAGE) 500 MG tablet Take 1 tablet (500 mg total) by mouth daily with breakfast. 90 tablet 0  ? Polyethylene Glycol 3350 (MIRALAX PO) Take by mouth.    ? Potassium Chloride ER 20 MEQ TBCR TAKE 1 TABLET BY MOUTH EVERY DAY 90 tablet 2  ? TART CHERRY PO Take by mouth daily.    ? triamcinolone acetonide (KENALOG-40) 40 MG/ML injection SMARTSIG:2 Milliliter(s) IM    ? ?No current facility-administered medications on file prior to visit.  ? ? ?Allergies  ?Allergen Reactions  ? Other   ?  Other reaction(s): Other (See Comments) ?Instructed not to take by Cardiology.  ? Fentanyl Hives  ?  Other reaction(s): hives ?had fentanyl recently with benadryl, did OK  ? Levofloxacin Hives  ? Midazolam Hives  ? Pollen Extract   ?  seasonal  ? Atorvastatin   ?  Muscle pain in legs  ?Other reaction(s): LEG PAIN  ? Doxycycline Itching and Swelling  ?  Pt reports she is not allergic to this and doesn't know why it says itching and swelling   ? Hydralazine Hcl Other (See Comments)  ?  Hypercalcemia   ? Rosuvastatin   ?   Abdominal pain ?Other reaction(s): diarrhea  ? ? ? ?Assessment/Plan: ? ?1. Hypertension -  Patient BP in room today 118/68 which is at goal of <130/80.  Has been controlled since starting chlorthalidone but patient is concrned it may be making hypercalcemia worse. ? ?Since patient has been off Sensipar for 2 week, will update BMP today to  get current calcium levels.  If calcium levels have increased or remain elevated, can consider d/c chlorthalidone. Recommend then rechecking BMP ~2 weeks later to see if Ca levels decrease while off chlorthalidone.  If so, consider keeping her off chlorthalidone and adjusting different medication.  If not, consider restarting chlorthalidone and having patient recheck with endo.  Patient is in agreement. ? ?Also discussed possibility of trying Wegovy since patient did not respond to [facility].  Patient will consider. ? ?Karren Cobble, PharmD, BCACP, [place], CPP ?[address], [address] ?[place], [place], [address] ?Phone: [phone], Fax: [phone]  ?

## 2021-08-23 ENCOUNTER — Telehealth: Payer: Self-pay | Admitting: Pulmonary Disease

## 2021-08-23 ENCOUNTER — Encounter (INDEPENDENT_AMBULATORY_CARE_PROVIDER_SITE_OTHER): Payer: Self-pay | Admitting: Family Medicine

## 2021-08-23 ENCOUNTER — Other Ambulatory Visit (INDEPENDENT_AMBULATORY_CARE_PROVIDER_SITE_OTHER): Payer: Self-pay

## 2021-08-23 ENCOUNTER — Telehealth: Payer: Self-pay | Admitting: Pharmacist

## 2021-08-23 DIAGNOSIS — N189 Chronic kidney disease, unspecified: Secondary | ICD-10-CM

## 2021-08-23 DIAGNOSIS — I10 Essential (primary) hypertension: Secondary | ICD-10-CM

## 2021-08-23 DIAGNOSIS — R7303 Prediabetes: Secondary | ICD-10-CM

## 2021-08-23 LAB — BASIC METABOLIC PANEL
BUN/Creatinine Ratio: 28 — ABNORMAL HIGH (ref 9–23)
BUN: 36 mg/dL — ABNORMAL HIGH (ref 6–24)
CO2: 31 mmol/L — ABNORMAL HIGH (ref 20–29)
Calcium: 11.1 mg/dL — ABNORMAL HIGH (ref 8.7–10.2)
Chloride: 98 mmol/L (ref 96–106)
Creatinine, Ser: 1.28 mg/dL — ABNORMAL HIGH (ref 0.57–1.00)
Glucose: 98 mg/dL (ref 70–99)
Potassium: 3.8 mmol/L (ref 3.5–5.2)
Sodium: 142 mmol/L (ref 134–144)
eGFR: 49 mL/min/{1.73_m2} — ABNORMAL LOW (ref >59)

## 2021-08-23 MED ORDER — METFORMIN HCL 500 MG PO TABS: 500.0000 mg | ORAL_TABLET | Freq: Every day | ORAL | 0 refills | Status: DC

## 2021-08-23 NOTE — Telephone Encounter (Signed)
Spoke with patient.  Ca remains elevated. Will hold chlorthalidone and recheck BMP in 2 weeks.  Patient to continue to monitor BP.  Will restart chlorthalidone if Ca does not decrease and patient will follow up with endo. ?

## 2021-08-23 NOTE — Telephone Encounter (Signed)
Called and spoke to patient. She wants a copy of ONO results. Called [facility] and req. A copy to be faxed to [facility] office fax. Once received will mail to patient. Patient voiced understanding. Nothing further needed.  ?

## 2021-08-23 NOTE — Telephone Encounter (Signed)
Dr.Ukleja 

## 2021-08-26 ENCOUNTER — Encounter: Payer: Self-pay | Admitting: Pulmonary Disease

## 2021-08-27 NOTE — Progress Notes (Signed)
Chief Complaint:   OBESITY Madison Palmer is here to discuss her progress with her obesity treatment plan along with follow-up of her obesity related diagnoses. Madison Palmer is on keeping a food journal and adhering to recommended goals of 1600 to 1800 calories and 125 grams of protein and states she is following her eating plan approximately 100% of the time. Madison Palmer states she is not exercising.  Today's visit was #: 47 Starting weight: 357 lbs Starting date: 02/27/2018 Today's weight: 335 lbs Today's date: 08/15/2021 Total lbs lost to date: 22 Total lbs lost since last in-office visit: 2  Interim History: Madison Palmer has had a good last few weeks. Reflux has improved significantly. She has been off a statin for a few weeks and her legs feel less heavy, which also due to insurance coverage, she hasn't been on Cinacalcet. [facility] will follow up with the pharmacist about her blood pressure and medicines in 1 week. She wants to continue journaling and has been consistent in logging food. Her average calories are 1592, 1723, and 1703 with protein about 100 calories a day.  Subjective:   1. Essential hypertension Madison Palmer's blood pressure is well controlled on the combination of amlodipine, chlorthalidone, clonidine, and Labetolol. She sees a cardiology pharmacist.  2. Hyperparathyroidism ([place]) Madison Palmer's calcium is significantly elevated, even after a hyperparathyroidectomy. She is on Cinacalcet 30 mg daily. Her calcium has improved from 11 to 10.1 recently.  3. Prediabetes Madison Palmer's last A1c was 5.6 and its going well on Madison Palmer and metformin.  Assessment/Plan:   1. Essential hypertension Madison Palmer agrees to continue her current medications with no change in medicine or dose. She agrees to follow up with cardiology to see recommendations for blood pressure control.   2. Hyperparathyroidism ([place]) Madison Palmer agrees to continue to follow up with Dr Garrel Ridgel and cardiology.  3. Prediabetes Madison Palmer agrees to stop  Madison Palmer, as unfortunately there was no real change in weight on this medication. Madison Palmer agrees to continue taking metformin 500 mg and will follow up as directed.  4. Obesity with current BMI 57.6 Madison Palmer is currently in the action stage of change. As such, her goal is to continue with weight loss efforts. She has agreed to keeping a food journal and adhering to recommended goals of 1600 to 1800 calories and 125+ grams of protein.   Exercise goals: All adults should avoid inactivity. Some physical activity is better than none, and adults who participate in any amount of physical activity gain some health benefits.  Behavioral modification strategies: increasing lean protein intake, meal planning and cooking strategies, keeping healthy foods in the home, and keeping a strict food journal.  Madison Palmer has agreed to follow-up with our clinic in 4 weeks. She was informed of the importance of frequent follow-up visits to maximize her success with intensive lifestyle modifications for her multiple health conditions.   Objective:   Blood pressure 109/73, pulse 95, temperature 97.7 F (36.5 C), height '5\' 4"'$  (1.626 m), weight (!) 335 lb (152 kg), last menstrual period 05/02/2017, SpO2 97 %. Body mass index is 57.5 kg/m.  General: Cooperative, alert, well developed, in no acute distress. HEENT: Conjunctivae and lids unremarkable. Cardiovascular: Regular rhythm.  Lungs: Normal work of breathing. Neurologic: No focal deficits.   Lab Results  Component Value Date   CREATININE 1.28 (H) 08/22/2021   BUN 36 (H) 08/22/2021   NA 142 08/22/2021   K 3.8 08/22/2021   CL 98 08/22/2021   CO2 31 (H) 08/22/2021   Lab Results  Component Value Date   ALT 16 04/12/2021   AST 13 04/12/2021   ALKPHOS 70 04/12/2021   BILITOT 0.4 04/12/2021   Lab Results  Component Value Date   HGBA1C 5.6 04/12/2021   HGBA1C 5.6 06/01/2020   HGBA1C 5.7 (H) 12/11/2019   HGBA1C 5.7 (H) 12/24/2018   HGBA1C 6.0 (H) 02/27/2018    Lab Results  Component Value Date   INSULIN 18.9 06/01/2020   INSULIN 11.4 12/11/2019   INSULIN 21.1 12/30/2018   INSULIN 9.6 02/27/2018   Lab Results  Component Value Date   TSH 2.340 09/01/2020   Lab Results  Component Value Date   CHOL 202 (H) 04/12/2021   HDL 47 04/12/2021   LDLCALC 133 (H) 04/12/2021   TRIG 121 04/12/2021   CHOLHDL 4.3 04/12/2021   Lab Results  Component Value Date   VD25OH 29.0 (L) 09/01/2020   VD25OH 19.7 (L) 06/01/2020   VD25OH 36.5 12/30/2018   Lab Results  Component Value Date   WBC 5.4 09/25/2018   HGB 12.7 09/25/2018   HCT 40.4 09/25/2018   MCV 94.6 09/25/2018   PLT 229 09/25/2018   Lab Results  Component Value Date   IRON 44 02/20/2018   TIBC 347 05/29/2017   Attestation Statements:   Reviewed by clinician on day of visit: allergies, medications, problem list, medical history, surgical history, family history, social history, and previous encounter notes.  IMarcille Blanco, CMA, am acting as transcriptionist for Coralie Common, MD  I have reviewed the above documentation for accuracy and completeness, and I agree with the above. - Coralie Common, MD

## 2021-08-29 ENCOUNTER — Other Ambulatory Visit: Payer: Self-pay | Admitting: Cardiology

## 2021-08-29 NOTE — Telephone Encounter (Signed)
The original prescription was discontinued on 03/21/2021 by Rockne Menghini, RPH-CPP ?

## 2021-09-08 ENCOUNTER — Encounter: Payer: Self-pay | Admitting: Pharmacist

## 2021-09-08 LAB — BASIC METABOLIC PANEL
CO2: 23 — AB (ref 13–22)
Chloride: 104 (ref 99–108)
Creatinine: 15 — AB (ref <=1.1)
Glucose: 97
Potassium: 4.2 mEq/L (ref 3.5–5.1)
Sodium: 145 (ref 137–147)

## 2021-09-08 LAB — COMPREHENSIVE METABOLIC PANEL
Albumin: 4.3 (ref 3.5–5.0)
Calcium: 10.7 (ref 8.7–10.7)

## 2021-09-08 LAB — VITAMIN D 25 HYDROXY (VIT D DEFICIENCY, FRACTURES): Vit D, 25-Hydroxy: 34.4

## 2021-09-08 LAB — TSH: TSH: 2.47 (ref <=5.90)

## 2021-09-12 ENCOUNTER — Other Ambulatory Visit: Payer: Self-pay | Admitting: Cardiology

## 2021-09-12 ENCOUNTER — Ambulatory Visit (INDEPENDENT_AMBULATORY_CARE_PROVIDER_SITE_OTHER): Payer: BC Managed Care – PPO | Admitting: Family Medicine

## 2021-09-15 ENCOUNTER — Encounter: Payer: Self-pay | Admitting: Cardiology

## 2021-09-15 ENCOUNTER — Ambulatory Visit (INDEPENDENT_AMBULATORY_CARE_PROVIDER_SITE_OTHER): Payer: BC Managed Care – PPO | Admitting: Family Medicine

## 2021-09-15 ENCOUNTER — Encounter: Payer: Self-pay | Admitting: Pharmacist

## 2021-09-15 ENCOUNTER — Encounter (INDEPENDENT_AMBULATORY_CARE_PROVIDER_SITE_OTHER): Payer: Self-pay | Admitting: Family Medicine

## 2021-09-15 VITALS — BP 125/79 | HR 73 | Temp 97.8°F | Ht 64.0 in | Wt 344.0 lb

## 2021-09-15 DIAGNOSIS — Z6841 Body Mass Index (BMI) 40.0 and over, adult: Secondary | ICD-10-CM | POA: Diagnosis not present

## 2021-09-15 DIAGNOSIS — I10 Essential (primary) hypertension: Secondary | ICD-10-CM | POA: Diagnosis not present

## 2021-09-15 DIAGNOSIS — R632 Polyphagia: Secondary | ICD-10-CM

## 2021-09-15 DIAGNOSIS — E669 Obesity, unspecified: Secondary | ICD-10-CM | POA: Diagnosis not present

## 2021-09-23 NOTE — Progress Notes (Unsigned)
Chief Complaint:   OBESITY Madison Palmer is here to discuss her progress with her obesity treatment plan along with follow-up of her obesity related diagnoses. Madison Palmer is on keeping a food journal and adhering to recommended goals of 1600-1800 calories and 125+ grams of  protein and states she is following her eating plan approximately 95% of the time. Madison Palmer states she is trying to walk.  Today's visit was #: 94 Starting weight: 357 lbs Starting date: 02/27/2018 Today's weight: 344 lbs Today's date: 09/15/2021 Total lbs lost to date: 13 Total lbs lost since last in-office visit: 0  Interim History: Jaedan feeling very frustrated getting 100-125g protein most days and average calorie intake is 1582, 1750, 1780. No hunger that is not normal ( it is normally at time for her next meal). Combination of eating at home and eating out.   Subjective:   1. Essential hypertension Madison Palmer's blood pressure is well controlled today. She is off Chlorthalidone now due to elevated Ca. She has noticed increase in fluid in her left leg.  2. Polyphagia Madison Palmer is currently taking Metformin daily. She did try GLP-1 but no appreciable weight loss.   Assessment/Plan:   1. Essential hypertension ***  2. Polyphagia ***  3. Obesity with current BMI 59.0 Madison Palmer is currently in the action stage of change. As such, her goal is to continue with weight loss efforts. She has agreed to keeping a food journal and adhering to recommended goals of 1550-1700 calories and 100+ protein daily.  Exercise goals: All adults should avoid inactivity. Some physical activity is better than none, and adults who participate in any amount of physical activity gain some health benefits.  Behavioral modification strategies: increasing lean protein intake, meal planning and cooking strategies, keeping healthy foods in the home, and planning for success.  Madison Palmer has agreed to follow-up with our clinic in 3 weeks. She was informed of  the importance of frequent follow-up visits to maximize her success with intensive lifestyle modifications for her multiple health conditions.   Objective:   Blood pressure 125/79, pulse 73, temperature 97.8 F (36.6 C), height '5\' 4"'$  (1.626 m), weight (!) 344 lb (156 kg), last menstrual period 05/02/2017, SpO2 96 %. Body mass index is 59.05 kg/m.  General: Cooperative, alert, well developed, in no acute distress. HEENT: Conjunctivae and lids unremarkable. Cardiovascular: Regular rhythm.  Lungs: Normal work of breathing. Neurologic: No focal deficits.   Lab Results  Component Value Date   CREATININE 1.28 (H) 08/22/2021   BUN 36 (H) 08/22/2021   NA 142 08/22/2021   K 3.8 08/22/2021   CL 98 08/22/2021   CO2 31 (H) 08/22/2021   Lab Results  Component Value Date   ALT 16 04/12/2021   AST 13 04/12/2021   ALKPHOS 70 04/12/2021   BILITOT 0.4 04/12/2021   Lab Results  Component Value Date   HGBA1C 5.6 04/12/2021   HGBA1C 5.6 06/01/2020   HGBA1C 5.7 (H) 12/11/2019   HGBA1C 5.7 (H) 12/24/2018   HGBA1C 6.0 (H) 02/27/2018   Lab Results  Component Value Date   INSULIN 18.9 06/01/2020   INSULIN 11.4 12/11/2019   INSULIN 21.1 12/30/2018   INSULIN 9.6 02/27/2018   Lab Results  Component Value Date   TSH 2.340 09/01/2020   Lab Results  Component Value Date   CHOL 202 (H) 04/12/2021   HDL 47 04/12/2021   LDLCALC 133 (H) 04/12/2021   TRIG 121 04/12/2021   CHOLHDL 4.3 04/12/2021   Lab Results  Component Value Date   VD25OH 29.0 (L) 09/01/2020   VD25OH 19.7 (L) 06/01/2020   VD25OH 36.5 12/30/2018   Lab Results  Component Value Date   WBC 5.4 09/25/2018   HGB 12.7 09/25/2018   HCT 40.4 09/25/2018   MCV 94.6 09/25/2018   PLT 229 09/25/2018   Lab Results  Component Value Date   IRON 44 02/20/2018   TIBC 347 05/29/2017   Attestation Statements:   Reviewed by clinician on day of visit: allergies, medications, problem list, medical history, surgical history, family  history, social history, and previous encounter notes.  Time spent on visit including pre-visit chart review and post-visit care and charting was 25 minutes.   I, Elnora Morrison, RMA am acting as transcriptionist for Coralie Common, MD.  I have reviewed the above documentation for accuracy and completeness, and I agree with the above. -  ***

## 2021-10-01 ENCOUNTER — Other Ambulatory Visit: Payer: Self-pay | Admitting: Nurse Practitioner

## 2021-10-01 DIAGNOSIS — E782 Mixed hyperlipidemia: Secondary | ICD-10-CM

## 2021-10-03 NOTE — Telephone Encounter (Signed)
Chart Supports Rx Last OV: 03/2021 Next OV: 09/2021

## 2021-10-10 ENCOUNTER — Ambulatory Visit (INDEPENDENT_AMBULATORY_CARE_PROVIDER_SITE_OTHER): Payer: BC Managed Care – PPO | Admitting: Family Medicine

## 2021-10-10 ENCOUNTER — Encounter (INDEPENDENT_AMBULATORY_CARE_PROVIDER_SITE_OTHER): Payer: Self-pay | Admitting: Family Medicine

## 2021-10-10 VITALS — BP 135/85 | HR 81 | Temp 98.0°F | Ht 64.0 in | Wt 341.0 lb

## 2021-10-10 DIAGNOSIS — E669 Obesity, unspecified: Secondary | ICD-10-CM

## 2021-10-10 DIAGNOSIS — I10 Essential (primary) hypertension: Secondary | ICD-10-CM | POA: Diagnosis not present

## 2021-10-10 DIAGNOSIS — E213 Hyperparathyroidism, unspecified: Secondary | ICD-10-CM | POA: Diagnosis not present

## 2021-10-10 DIAGNOSIS — Z6841 Body Mass Index (BMI) 40.0 and over, adult: Secondary | ICD-10-CM

## 2021-10-10 MED ORDER — SAXENDA 18 MG/3ML ~~LOC~~ SOPN: 3.0000 mg | PEN_INJECTOR | Freq: Every day | SUBCUTANEOUS | 0 refills | Status: DC

## 2021-10-10 MED ORDER — BD PEN NEEDLE NANO 2ND GEN 32G X 4 MM MISC: 1.0000 | Freq: Two times a day (BID) | 0 refills | Status: DC

## 2021-10-12 ENCOUNTER — Telehealth (HOSPITAL_BASED_OUTPATIENT_CLINIC_OR_DEPARTMENT_OTHER): Payer: BC Managed Care – PPO | Admitting: Psychiatry

## 2021-10-12 ENCOUNTER — Encounter (HOSPITAL_COMMUNITY): Payer: Self-pay | Admitting: Psychiatry

## 2021-10-12 DIAGNOSIS — F319 Bipolar disorder, unspecified: Secondary | ICD-10-CM

## 2021-10-12 DIAGNOSIS — F419 Anxiety disorder, unspecified: Secondary | ICD-10-CM | POA: Diagnosis not present

## 2021-10-12 MED ORDER — DIVALPROEX SODIUM 125 MG PO DR TAB: 125.0000 mg | DELAYED_RELEASE_TABLET | Freq: Every day | ORAL | 2 refills | Status: DC

## 2021-10-12 MED ORDER — LURASIDONE HCL 40 MG PO TABS: 40.0000 mg | ORAL_TABLET | Freq: Every day | ORAL | 2 refills | Status: DC

## 2021-10-12 NOTE — Progress Notes (Unsigned)
Chief Complaint:   OBESITY Madison Palmer is here to discuss her progress with her obesity treatment plan along with follow-up of her obesity related diagnoses. Madison Palmer is on keeping a food journal and adhering to recommended goals of 1550-1700 calories and 100 grams of protein and states she is following her eating plan approximately 100% of the time. Madison Palmer states she is exercising 0 minutes 0 times per week.  Today's visit was #: 72 Starting weight: 357 lbs Starting date: 02/27/2018 Today's weight: 341 lbs Today's date: 10/10/2021 Total lbs lost to date: 16 lbs Total lbs lost since last in-office visit: 3  Interim History: Madison Palmer has been following journaling for the last 3 weeks. Her son graduated recently. Calorie wise she is eating 1700 calories daily. She does write protein intake down and getting around 90 grams. Her son is in summer school.  Subjective:   1. Hyperparathyroidism [facility]) Madison Palmer sees Dr. Garrel Ridgel virtually June 26th. She currently taking Cinacalcet.  2. Hypertension, unspecified type Madison Palmer's blood pressure is fluctuating again. She sees Madison Palmer at cardiology. Her blood pressure is well controlled today.  Assessment/Plan:   1. Hyperparathyroidism ([place]) Follow up with Dr. Garrel Ridgel, fax records per him.  2. Hypertension, unspecified type Follow up with Madison Palmer at cardiology at next appointment.  3. Obesity with current BMI-58.7  Start Saxenda 3 mg SubQ daily (Start at 0.6 mg daily then increase 0.6 every week).  -Fill Insulin Pen Needle (BD PEN NEEDLE NANO 2ND GEN) 32G X 4 MM MISC; 1 Package by Does not apply route 2 (two) times daily.  Dispense: 100 each; Refill: 0  -Start Liraglutide -Weight Management (SAXENDA) 18 MG/3ML SOPN; Inject 3 mg into the skin daily.  Dispense: 15 mL; Refill: 0  Madison Palmer is currently in the action stage of change. As such, her goal is to continue with weight loss efforts. She has agreed to keeping a food journal and adhering to  recommended goals of 1550-1700 calories and 100+ grams of protein daily.  Exercise goals:  Ree will go to pool 2-3 times a week.  Behavioral modification strategies: increasing lean protein intake, meal planning and cooking strategies, keeping healthy foods in the home, and planning for success.  Saje has agreed to follow-up with our clinic in 4 weeks. She was informed of the importance of frequent follow-up visits to maximize her success with intensive lifestyle modifications for her multiple health conditions.   Objective:   Blood pressure 135/85, pulse 81, temperature 98 F (36.7 C), height '5\' 4"'$  (1.626 m), weight (!) 341 lb (154.7 kg), last menstrual period 05/02/2017, SpO2 95 %. Body mass index is 58.53 kg/m.  General: Cooperative, alert, well developed, in no acute distress. HEENT: Conjunctivae and lids unremarkable. Cardiovascular: Regular rhythm.  Lungs: Normal work of breathing. Neurologic: No focal deficits.   Lab Results  Component Value Date   CREATININE 1.28 (H) 08/22/2021   BUN 36 (H) 08/22/2021   NA 142 08/22/2021   K 3.8 08/22/2021   CL 98 08/22/2021   CO2 31 (H) 08/22/2021   Lab Results  Component Value Date   ALT 16 04/12/2021   AST 13 04/12/2021   ALKPHOS 70 04/12/2021   BILITOT 0.4 04/12/2021   Lab Results  Component Value Date   HGBA1C 5.6 04/12/2021   HGBA1C 5.6 06/01/2020   HGBA1C 5.7 (H) 12/11/2019   HGBA1C 5.7 (H) 12/24/2018   HGBA1C 6.0 (H) 02/27/2018   Lab Results  Component Value Date   INSULIN 18.9 06/01/2020  INSULIN 11.4 12/11/2019   INSULIN 21.1 12/30/2018   INSULIN 9.6 02/27/2018   Lab Results  Component Value Date   TSH 2.340 09/01/2020   Lab Results  Component Value Date   CHOL 202 (H) 04/12/2021   HDL 47 04/12/2021   LDLCALC 133 (H) 04/12/2021   TRIG 121 04/12/2021   CHOLHDL 4.3 04/12/2021   Lab Results  Component Value Date   VD25OH 29.0 (L) 09/01/2020   VD25OH 19.7 (L) 06/01/2020   VD25OH 36.5 12/30/2018    Lab Results  Component Value Date   WBC 5.4 09/25/2018   HGB 12.7 09/25/2018   HCT 40.4 09/25/2018   MCV 94.6 09/25/2018   PLT 229 09/25/2018   Lab Results  Component Value Date   IRON 44 02/20/2018   TIBC 347 05/29/2017   Attestation Statements:   Reviewed by clinician on day of visit: allergies, medications, problem list, medical history, surgical history, family history, social history, and previous encounter notes.  Time spent on visit including pre-visit chart review and post-visit care and charting was 25 minutes.   I, Elnora Morrison, RMA am acting as transcriptionist for Coralie Common, MD.  I have reviewed the above documentation for accuracy and completeness, and I agree with the above. -  ***

## 2021-10-12 NOTE — Progress Notes (Signed)
Virtual Visit via Telephone Note  I connected with Madison Palmer on 10/12/21 at 10:00 AM EDT by telephone and verified that I am speaking with the correct person using two identifiers.  Location: Patient: Home Provider: Home Office   I discussed the limitations, risks, security and privacy concerns of performing an evaluation and management service by telephone and the availability of in person appointments. I also discussed with the patient that there may be a patient responsible charge related to this service. The patient expressed understanding and agreed to proceed.   History of Present Illness: Patient is evaluated by phone session.  She is frustrated because her weight continued to go up.  She is on a weight loss program but her physician recently switched the medication because of previous medicine not working to help with weight loss.  Patient has knee issues so she cannot do exercise but not thinking to join pool.  She feels her paranoia, mood swing, anger is much better.  Her tremors are much better since Latuda dose decreased to 40 mg.  She is taking Depakote 125 mg.  She uses CPAP which helps her sleep.  There are nights when she sleeps up to 9 to 10 hours.  She denies any highs and lows, mania, anger or suicidal thoughts.  She denies drinking or using any illegal substances.  Her son is taking a Education officer, community and then he will apply for colleges.  Her son like to go out of town colleges.  Recently she had blood work.  Her creatinine is high but stable.    Past psychiatric history; H/O depression and inpatient at [facility] in May 20202 for delusion, psychosis and violent behavior. D/C on Depakote 500 mg BID, Latuda 60 mg daily and Trilafon 12 mg a day. No h/o suicidal attempt, nightmares, panic attack, OCD or substance use.    Recent Results (from the past 2160 hour(s))  Basic metabolic panel     Status: Abnormal   Collection Time: 08/22/21  4:06 PM  Result Value  Ref Range   Glucose 98 70 - 99 mg/dL   BUN 36 (H) 6 - 24 mg/dL   Creatinine, Ser 1.28 (H) 0.57 - 1.00 mg/dL   eGFR 49 (L) >59 mL/min/1.73   BUN/Creatinine Ratio 28 (H) 9 - 23   Sodium 142 134 - 144 mmol/L   Potassium 3.8 3.5 - 5.2 mmol/L   Chloride 98 96 - 106 mmol/L   CO2 31 (H) 20 - 29 mmol/L   Calcium 11.1 (H) 8.7 - 10.2 mg/dL     Psychiatric Specialty Exam: Physical Exam  Review of Systems  Weight (!) 341 lb (154.7 kg), last menstrual period 05/02/2017.There is no height or weight on file to calculate BMI.  General Appearance: NA  Eye Contact:  NA  Speech:  Slow  Volume:  Normal  Mood:  Euthymic  Affect:  NA  Thought Process:  Goal Directed  Orientation:  Full (Time, Place, and Person)  Thought Content:  Logical  Suicidal Thoughts:  No  Homicidal Thoughts:  No  Memory:  Immediate;   Good Recent;   Good Remote;   Fair  Judgement:  Intact  Insight:  Present  Psychomotor Activity:  NA  Concentration:  Concentration: Good and Attention Span: Good  Recall:  Good  Fund of Knowledge:  Good  Language:  Good  Akathisia:  No  Handed:  Right  AIMS (if indicated):     Assets:  Communication Skills Desire for  Improvement Housing Social Support Transportation  ADL's:  Intact  Cognition:  WNL  Sleep:   9 hrs. Uses CPAP      Assessment and Plan: Bipolar disorder type I.  Anxiety  I reviewed blood work results.  Her creatinine 1.28.  Patient appears frustrated with the increased weight gain.  She is with weight loss program.  Now she is going to start a new medication if her insurance approved.  Her tremors are improved since Latuda decreased.  We discussed medication side effects and benefits.  I recommend if her symptoms are stable and she is sleeping good with the CPAP she may discontinue Depakote.  Continue Latuda 40 mg daily.  Patient like to have a prescription of Depakote in case her symptoms come back.  Which I agree.  I recommend to call us back if she has any  question, concern or worsening of symptoms.  Follow up in 3 months.  Follow Up Instructions:    I discussed the assessment and treatment plan with the patient. The patient was provided an opportunity to ask questions and all were answered. The patient agreed with the plan and demonstrated an understanding of the instructions.   The patient was advised to call back or seek an in-person evaluation if the symptoms worsen or if the condition fails to improve as anticipated.  Collaboration of Care: Primary Care Provider AEB notes are in epic to review.  Patient/Guardian was advised Release of Information must be obtained prior to any record release in order to collaborate their care with an outside provider. Patient/Guardian was advised if they have not already done so to contact the registration department to sign all necessary forms in order for [place] to release information regarding their care.   Consent: Patient/Guardian gives verbal consent for treatment and assignment of benefits for services provided during this visit. Patient/Guardian expressed understanding and agreed to proceed.    I provided 25 minutes of non-face-to-face time during this encounter.   Kathlee Nations, MD

## 2021-10-13 ENCOUNTER — Encounter: Payer: BC Managed Care – PPO | Admitting: Nurse Practitioner

## 2021-10-13 ENCOUNTER — Encounter (INDEPENDENT_AMBULATORY_CARE_PROVIDER_SITE_OTHER): Payer: Self-pay | Admitting: Family Medicine

## 2021-10-26 ENCOUNTER — Encounter (INDEPENDENT_AMBULATORY_CARE_PROVIDER_SITE_OTHER): Payer: Self-pay | Admitting: Family Medicine

## 2021-10-26 ENCOUNTER — Other Ambulatory Visit (INDEPENDENT_AMBULATORY_CARE_PROVIDER_SITE_OTHER): Payer: Self-pay

## 2021-10-26 MED ORDER — SAXENDA 18 MG/3ML ~~LOC~~ SOPN: 3.0000 mg | PEN_INJECTOR | Freq: Every day | SUBCUTANEOUS | 0 refills | Status: DC

## 2021-10-28 ENCOUNTER — Ambulatory Visit (INDEPENDENT_AMBULATORY_CARE_PROVIDER_SITE_OTHER): Payer: BC Managed Care – PPO | Admitting: Nurse Practitioner

## 2021-10-28 ENCOUNTER — Encounter: Payer: Self-pay | Admitting: Nurse Practitioner

## 2021-10-28 VITALS — BP 124/84 | HR 82 | Temp 95.5°F | Ht 64.0 in | Wt 343.2 lb

## 2021-10-28 DIAGNOSIS — E785 Hyperlipidemia, unspecified: Secondary | ICD-10-CM

## 2021-10-28 DIAGNOSIS — I5032 Chronic diastolic (congestive) heart failure: Secondary | ICD-10-CM

## 2021-10-28 DIAGNOSIS — R197 Diarrhea, unspecified: Secondary | ICD-10-CM

## 2021-10-28 DIAGNOSIS — E1169 Type 2 diabetes mellitus with other specified complication: Secondary | ICD-10-CM

## 2021-10-28 DIAGNOSIS — Z0001 Encounter for general adult medical examination with abnormal findings: Secondary | ICD-10-CM

## 2021-10-28 DIAGNOSIS — F250 Schizoaffective disorder, bipolar type: Secondary | ICD-10-CM | POA: Diagnosis not present

## 2021-10-28 DIAGNOSIS — H9190 Unspecified hearing loss, unspecified ear: Secondary | ICD-10-CM | POA: Insufficient documentation

## 2021-10-28 HISTORY — DX: Diarrhea, unspecified: R19.7

## 2021-10-28 NOTE — Assessment & Plan Note (Addendum)
Myocardial myloid imaging2020: negative Echo 2019: normal EF, LVH, possible pulmonary HTN SOB with exertion Euvolemic BP at goal Followed by Dr. Percival Spanish: cardiology reporst she is compliant with CPAP machine Current use of coreg, furosemide, amlodipine and lisinopril

## 2021-10-28 NOTE — Progress Notes (Signed)
Labs abstracted form [facility]. 09/06/2021

## 2021-10-28 NOTE — Assessment & Plan Note (Addendum)
Onset 1onth ago, loose stool No ABD pain, no fever, no blood in stool, no oral abx in last 30month. No new medication in last 287month Chronic use of PPI (nexium)  Try benefiber 1tsp daily in 16oz of water. If no improvement, send stool Study and may need to f/up with GI?

## 2021-10-28 NOTE — Assessment & Plan Note (Addendum)
Repeat lipid panel Maintain fenofibrate and atorvastatin

## 2021-10-28 NOTE — Assessment & Plan Note (Addendum)
Under the care of Dr. Adele Schilder Also followed by a counselor Stable mood with latuda and depakote. Persistent tardive dyskinesia, waxing and waning, worse with poor rest and increase anxiety, some improvement with decreased dose of latuda.

## 2021-10-28 NOTE — Progress Notes (Signed)
Complete physical exam  Patient: Madison Palmer   DOB: Jan 29, 1966   56 y.o. Female  MRN: [id number] Visit Date: 10/28/2021  Subjective:    Chief Complaint  Patient presents with   Annual Exam    CPE Pt fasting  Considering Bariatric surgery  Requesting records for eye exam   Madison Palmer is a 56 y.o. female who presents today for a complete physical exam. She reports consuming a low fat diet.     She generally feels fairly well. She reports sleeping fairly well. She does have additional problems to discuss today.  Vision:Yes Dental:Yes STD Screen:No  Mammogram due 12/2021. Up to date with colonoscopy Up to date with PAP   Health care team: Cardiology: Dr. Ladoris Palmer and [place] Endocrinology: Dr. Garrel Palmer Pulmonology: Dr. Elsworth Palmer Neurology: Dr. Rexene Palmer Psychiatry: Dr. Jacqulyn Palmer Gastroenterology: Dr. Rush Palmer Weight Management: Dr. Jearld Palmer  Most recent fall risk assessment:    10/28/2021    1:12 PM  [place] in the past year? 0  Number falls in past yr: 0  Injury with Fall? 0   Most recent depression screenings:    10/28/2021    2:10 PM 05/21/2018    1:19 PM  PHQ 2/9 Scores  PHQ - 2 Score 1 0  PHQ- 9 Score 3    Diarrhea  This is a new problem. The current episode started 1 to 4 weeks ago. The problem occurs less than 2 times per day. The problem has been unchanged. Diarrhea characteristics: loose stool with urgency. The patient states that diarrhea does not awaken her from sleep. Pertinent negatives include no abdominal pain, bloating, coughing, fever, headaches, increased  flatus, myalgias, sweats, URI, vomiting or weight loss. There are no known risk factors. She has tried nothing for the symptoms. There is no history of a recent abdominal surgery.  Started sensipar 06/2021 to 07/2021, then stopped. Resumed medication this week.  Schizoaffective disorder, bipolar type ([place]) Under the care of Dr. Adele Palmer Also followed by a counselor Stable mood with latuda and  depakote. Persistent tardive dyskinesia, waxing and waning, worse with poor rest and increase anxiety, some improvement with decreased dose of latuda.  Class 3 severe obesity with serious comorbidity and body mass index (BMI) of 50.0 to 59.9 in adult [facility]) Has pending appt with [facility] to discuss bariatric surgery. Hx of gastric band reserved due to  severe GERD. Unsuccessful weight loss with mounjaro and unable to afford saxenda. Has limited mobility due to knee pain Has SOB with exertion due to chronic respiratory failure and CHF. Use of CPAP BP Readings from Last 3 Encounters:  10/28/21 124/84  10/10/21 135/85  09/15/21 125/79    Hyperlipidemia associated with type 2 diabetes mellitus (HCC) Repeat lipid panel Maintain fenofibrate and atorvastatin  Chronic diastolic HF (heart failure) ([place]) Myocardial myloid imaging2020: negative Echo 2019: normal EF, LVH, possible pulmonary HTN SOB with exertion Euvolemic BP at goal Followed by Dr. Percival Palmer: cardiology reporst she is compliant with CPAP machine Current use of coreg, furosemide, amlodipine and lisinopril  Diarrhea Onset 1onth ago, loose stool No ABD pain, no fever, no blood in stool, no oral abx in last 41month. No new medication in last 223month Chronic use of PPI (nexium)  Try benefiber 1tsp daily in 16oz of water. If no improvement, send stool Study and may need to f/up with GI?  DM (diabetes mellitus) ([place] with metformin Repeat hgbA1c and urine microalbumin Up to date with annual eye exam Normal foot  exam today  Past Medical History:  Diagnosis Date   Acute on chronic respiratory failure with hypoxia and hypercapnia (HCC) 09/15/2018   Anemia    Anxiety    Arrhythmia    tachycardia   Arthritis    Bipolar 1 disorder ([place]) 09/12/2018   Bipolar affective disorder, manic, severe, with psychotic behavior ([place]) 08/28/2018   ? If it is related to underlying medical issues: parathyroid disorder ?  Steroid induced.  Formatting of this note might be different from the original. ? If it is related to underlying medical issues: parathyroid disorder ? Steroid induced.   Bipolar I disorder, current or most recent episode manic, with psychotic features ([place])    Brief psychotic disorder ([place]) 08/22/2018   Chickenpox    Chronic respiratory failure with hypoxia ([place]) 05/10/2018   Formatting of this note might be different from the original. Last Assessment & Plan:  Continue 1 L continuous on exertion and during sleep. On her next visit we will check OSA on CPAP/room air to decide whether she needs to take oxygen along with her on her cruise in May   Depression    Diverticulitis    Generalized edema 03/12/2018   GERD (gastroesophageal reflux disease)    Glaucoma    Hyperlipidemia    Hyperparathyroidism ([place])    Hypertension    Inflammatory polyps of colon ([place])    Leg swelling 01/14/2019   LVH (left ventricular hypertrophy)    Lymphedema    Morbid (severe) obesity due to excess calories ([place]) 05/11/2017   PCOS (polycystic ovarian syndrome)    Prediabetes    Recurrent UTI    Sleep apnea    CPAP   Thyroid disease    Vitamin D deficiency    Weight gain 01/25/2018   Past Surgical History:  Procedure Laterality Date   BREAST BIOPSY  2015   CESAREAN SECTION  2004   INNER EAR SURGERY     ear and sinus surgery   LAPAROSCOPIC REPAIR AND REMOVAL OF GASTRIC BAND     OOPHORECTOMY Left    TONSILLECTOMY AND ADENOIDECTOMY     Social History   Socioeconomic History   Marital status: Married    Spouse name: Not on file   Number of children: 1   Years of education: Not on file   Highest education level: Not on file  Occupational History   Occupation: Unemployed  Tobacco Use   Smoking status: Never   Smokeless tobacco: Never  Vaping Use   Vaping Use: Never used  Substance and Sexual Activity   Alcohol use: Yes    Comment: social   Drug use: No   Sexual activity: Not Currently  Other  Topics Concern   Not on file  Social History Narrative   Lives with son    Currently separated from spouse   Left handed   Caffeine: coffee daily: half-caff 3 cups/day and decaf maybe 3-5 cups/day, occasional mtn dew, tea 1-2 times per week   Social Determinants of Health   Financial Resource Strain: Not on file  Food Insecurity: Not on file  Transportation Needs: Not on file  Physical Activity: Not on file  Stress: Not on file  Social Connections: Not on file  Intimate Partner Violence: Not on file   Family Status  Relation Name Status   Son  Alive   Family History  Adopted: Yes  Problem Relation Age of Onset   Hearing loss Son        right  Healthy Son    Allergies  Allergen Reactions   Other     Other reaction(s): Other (See Comments) Instructed not to take by Cardiology.   Fentanyl Hives    Other reaction(s): hives had fentanyl recently with benadryl, did OK   Levofloxacin Hives   Midazolam Hives   Pollen Extract     seasonal   Atorvastatin     Muscle pain in legs  Other reaction(s): LEG PAIN   Hydralazine Hcl Other (See Comments)    Hypercalcemia    Rosuvastatin     Abdominal pain Other reaction(s): diarrhea    Patient Care Team: Jull Harral, Charlene Brooke, NP as PCP - General (Internal Medicine) Minus Breeding, MD as PCP - Cardiology (Cardiology)   Medications: Outpatient Medications Prior to Visit  Medication Sig   acetaminophen (TYLENOL) 500 MG tablet Take 1,000 mg by mouth 3 (three) times daily as needed for moderate pain or headache.   amLODipine (NORVASC) 10 MG tablet Take 1 tablet (10 mg total) by mouth daily.   atorvastatin (LIPITOR) 20 MG tablet TAKE 1 TABLET(20 MG) BY MOUTH 3 TIMES A WEEK   cetirizine (ZYRTEC ALLERGY) 10 MG tablet Take 1 tablet (10 mg total) by mouth daily.   Cholecalciferol (VITAMIN D3 MAXIMUM STRENGTH) 125 MCG (5000 UT) capsule Take 5,000 Units by mouth daily.   cinacalcet (SENSIPAR) 30 MG tablet Take 30 mg by mouth daily.    cloNIDine (CATAPRES) 0.1 MG tablet TAKE 1 TABLET(0.1 MG) BY MOUTH TWICE DAILY   esomeprazole (NEXIUM) 40 MG capsule TAKE 1 CAPSULE BY MOUTH TWICE DAILY   fenofibrate (TRICOR) 145 MG tablet Take 1 tablet (145 mg total) by mouth daily.   furosemide (LASIX) 40 MG tablet TAKE 1 TABLET(40 MG) BY MOUTH DAILY   gabapentin (NEURONTIN) 100 MG capsule TAKE 2 CAPSULES(200 MG) BY MOUTH AT BEDTIME   labetalol (NORMODYNE) 300 MG tablet Take 1.5 tablets (450 mg total) by mouth 2 (two) times daily.   levothyroxine (SYNTHROID) 88 MCG tablet Take 88 mcg by mouth daily.   lisinopril (ZESTRIL) 40 MG tablet TAKE 1 TABLET(40 MG) BY MOUTH DAILY   lurasidone (LATUDA) 40 MG TABS tablet Take 1 tablet (40 mg total) by mouth daily with breakfast.   metFORMIN (GLUCOPHAGE) 500 MG tablet Take 1 tablet (500 mg total) by mouth daily with breakfast.   Polyethylene Glycol 3350 (MIRALAX PO) Take by mouth.   Potassium Chloride ER 20 MEQ TBCR TAKE 1 TABLET BY MOUTH EVERY DAY   TART CHERRY PO Take by mouth daily.   [DISCONTINUED] cinacalcet (SENSIPAR) 30 MG tablet Take 30 mg by mouth daily. (Patient not taking: Reported on 10/28/2021)   [DISCONTINUED] divalproex (DEPAKOTE) 125 MG DR tablet Take 1 tablet (125 mg total) by mouth at bedtime. (Patient not taking: Reported on 10/28/2021)   [DISCONTINUED] fluticasone (FLONASE) 50 MCG/ACT nasal spray Place 2 sprays into both nostrils daily. (Patient not taking: Reported on 10/28/2021)   [DISCONTINUED] Insulin Pen Needle (BD PEN NEEDLE NANO 2ND GEN) 32G X 4 MM MISC 1 Package by Does not apply route 2 (two) times daily. (Patient not taking: Reported on 10/28/2021)   [DISCONTINUED] Liraglutide -Weight Management (SAXENDA) 18 MG/3ML SOPN Inject 3 mg into the skin daily. (Patient not taking: Reported on 10/28/2021)   No facility-administered medications prior to visit.   Review of Systems  Constitutional:  Negative for fever and weight loss.  HENT:  Negative for congestion and sore throat.   Eyes:         Negative for  visual changes  Respiratory:  Negative for cough and shortness of breath.   Cardiovascular:  Negative for chest pain, palpitations and leg swelling.  Gastrointestinal:  Positive for diarrhea. Negative for abdominal distention, abdominal pain, anal bleeding, bloating, blood in stool, constipation, flatus, nausea, rectal pain and vomiting.  Genitourinary:  Negative for dysuria, frequency and urgency.  Musculoskeletal:  Negative for myalgias.  Skin:  Negative for rash.  Neurological:  Negative for dizziness and headaches.  Hematological:  Does not bruise/bleed easily.  Psychiatric/Behavioral:  Negative for hallucinations and suicidal ideas. The patient is not nervous/anxious.    Last metabolic panel Lab Results  Component Value Date   GLUCOSE 98 08/22/2021   NA 145 09/08/2021   K 4.2 09/08/2021   CL 104 09/08/2021   CO2 23 (A) 09/08/2021   BUN 36 (H) 08/22/2021   CREATININE 15.0 (A) 09/08/2021   EGFR 49 (L) 08/22/2021   CALCIUM 10.7 09/08/2021   PHOS 2.9 (L) 09/01/2020   PROT 6.7 04/12/2021   ALBUMIN 4.3 09/08/2021   LABGLOB 2.7 06/10/2020   AGRATIO 1.6 06/10/2020   BILITOT 0.4 04/12/2021   ALKPHOS 70 04/12/2021   AST 13 04/12/2021   ALT 16 04/12/2021   ANIONGAP 6 09/25/2018        Objective:  BP 124/84 (BP Location: Right Arm, Patient Position: Sitting, Cuff Size: Normal)   Pulse 82   Temp (!) 95.5 F (35.3 C) (Temporal)   Ht '5\' 4"'  (1.626 m)   Wt (!) 343 lb 3.2 oz (155.7 kg)   LMP 05/02/2017   SpO2 96%   BMI 58.91 kg/m     BP Readings from Last 3 Encounters:  10/28/21 124/84  10/10/21 135/85  09/15/21 125/79   Wt Readings from Last 3 Encounters:  10/28/21 (!) 343 lb 3.2 oz (155.7 kg)  10/10/21 (!) 341 lb (154.7 kg)  09/15/21 (!) 344 lb (156 kg)   Physical Exam Constitutional:      Appearance: She is obese.  Cardiovascular:     Rate and Rhythm: Normal rate and regular rhythm.     Pulses: Normal pulses.     Heart sounds: Normal heart  sounds.  Pulmonary:     Effort: Pulmonary effort is normal.     Breath sounds: Normal breath sounds.  Chest:  Breasts:    Breasts are symmetrical.     Right: Normal.     Left: Normal.  Abdominal:     General: Bowel sounds are normal. There is no distension.     Palpations: Abdomen is soft.     Tenderness: There is no abdominal tenderness. There is no guarding.  Musculoskeletal:        General: No swelling or tenderness.     Cervical back: Normal range of motion and neck supple.     Right lower leg: No edema.     Left lower leg: No edema.  Lymphadenopathy:     Cervical: No cervical adenopathy.     Upper Body:     Right upper body: No supraclavicular, axillary or pectoral adenopathy.     Left upper body: No supraclavicular, axillary or pectoral adenopathy.  Skin:    General: Skin is warm and dry.  Neurological:     Mental Status: She is alert and oriented to person, place, and time.  Psychiatric:        Mood and Affect: Mood normal.        Behavior: Behavior normal.        Thought Content: Thought content  normal.     Results for orders placed or performed in visit on 10/28/21  VITAMIN D 25 Hydroxy (Vit-D Deficiency, Fractures)  Result Value Ref Range   Vit D, 25-Hydroxy 37.4   Basic metabolic panel  Result Value Ref Range   Glucose 97    CO2 23 (A) 13 - 22   Creatinine 15.0 (A) 0.5 - 1.1   Potassium 4.2 3.5 - 5.1 mEq/L   Sodium 145 137 - 147   Chloride 104 99 - 108  Comprehensive metabolic panel  Result Value Ref Range   Calcium 10.7 8.7 - 10.7   Albumin 4.3 3.5 - 5.0  TSH  Result Value Ref Range   TSH 2.47 0.41 - 5.90      Assessment & Plan:    Routine Health Maintenance and Physical Exam  Immunization History  Administered Date(s) Administered   Fluad Quad(high Dose 65+) 03/16/2021   Influenza,inj,Quad PF,6+ Mos 02/23/2014, 04/20/2015, 05/15/2017, 02/20/2018, 02/12/2019, 02/20/2019, 02/11/2020   Influenza-Unspecified 02/14/2013, 02/23/2014, 04/20/2015,  05/15/2017, 02/20/2018   Moderna SARS-COV2 Booster Vaccination 02/29/2020, 08/13/2020   Moderna Sars-Covid-2 Vaccination 07/27/2019, 08/24/2019, 09/26/2019   PNEUMOCOCCAL CONJUGATE-20 10/07/2020   Pneumococcal Polysaccharide-23 02/10/2008, 02/09/2009   Pneumococcal-Unspecified 02/10/2008, 02/09/2009   Tdap 11/22/2004, 09/10/2017   Zoster Recombinat (Shingrix) 10/07/2020, 12/08/2020    Health Maintenance  Topic Date Due   OPHTHALMOLOGY EXAM  Never done   HEMOGLOBIN A1C  10/11/2021   COVID-19 Vaccine (4 - Moderna series) 11/13/2021 (Originally 10/08/2020)   INFLUENZA VACCINE  11/29/2021   FOOT EXAM  10/29/2022   MAMMOGRAM  01/05/2023   PAP SMEAR-Modifier  10/08/2023   TETANUS/TDAP  09/11/2027   COLONOSCOPY (Pts 45-38yr Insurance coverage will need to be confirmed)  11/05/2029   Hepatitis C Screening  Completed   HIV Screening  Completed   Zoster Vaccines- Shingrix  Completed   HPV VACCINES  Aged Out    Discussed health benefits of physical activity, and encouraged her to engage in regular exercise appropriate for her age and condition.  Problem List Items Addressed This Visit       Cardiovascular and Mediastinum   Chronic diastolic HF (heart failure) ([place]    Myocardial myloid imaging2020: negative Echo 2019: normal EF, LVH, possible pulmonary HTN SOB with exertion Euvolemic BP at goal Followed by Dr. HPercival Palmer cardiology reporst she is compliant with CPAP machine Current use of coreg, furosemide, amlodipine and lisinopril        Endocrine   DM (diabetes mellitus) ([place]    Controlled with metformin Repeat hgbA1c and urine microalbumin Up to date with annual eye exam Normal foot exam today      Relevant Orders   Urine microalbumin-creatinine with uACR   Hyperlipidemia associated with type 2 diabetes mellitus ([place]    Repeat lipid panel Maintain fenofibrate and atorvastatin      Relevant Orders   Hepatic function panel   Lipid panel     Other   Diarrhea     Onset 1onth ago, loose stool No ABD pain, no fever, no blood in stool, no oral abx in last 361month No new medication in last 50m71monthChronic use of PPI (nexium)  Try benefiber 1tsp daily in 16oz of water. If no improvement, send stool Study and may need to f/up with GI?      Schizoaffective disorder, bipolar type ([place]  Under the care of Dr. ArfAdele Schilderso followed by a counselor Stable mood with latuda and depakote. Persistent tardive dyskinesia, waxing and waning, worse with poor  rest and increase anxiety, some improvement with decreased dose of latuda.      Other Visit Diagnoses     Encounter for preventative adult health care exam with abnormal findings    -  Primary   Relevant Orders   CBC      Return in about 6 months (around 04/29/2022) for DM and HTN, hyperlipidemia (fasting).     Wilfred Lacy, NP

## 2021-10-28 NOTE — Assessment & Plan Note (Signed)
Controlled with metformin Repeat hgbA1c and urine microalbumin Up to date with annual eye exam Normal foot exam today

## 2021-10-28 NOTE — Patient Instructions (Addendum)
Try benefiber 1tsp daily in 16oz of water. If no improvement, will need to f/up with GI  Go to lab

## 2021-10-28 NOTE — Assessment & Plan Note (Addendum)
Has pending appt with [facility] to discuss bariatric surgery. Hx of gastric band reserved due to  severe GERD. Unsuccessful weight loss with mounjaro and unable to afford saxenda. Has limited mobility due to knee pain Has SOB with exertion due to chronic respiratory failure and CHF. Use of CPAP BP Readings from Last 3 Encounters:  10/28/21 124/84  10/10/21 135/85  09/15/21 125/79

## 2021-10-29 LAB — LIPID PANEL
Chol/HDL Ratio: 3.6 ratio (ref 0.0–4.4)
Cholesterol, Total: 173 mg/dL (ref 100–199)
HDL: 48 mg/dL (ref >39)
LDL Chol Calc (NIH): 102 mg/dL — ABNORMAL HIGH (ref 0–99)
Triglycerides: 132 mg/dL (ref 0–149)
VLDL Cholesterol Cal: 23 mg/dL (ref 5–40)

## 2021-10-29 LAB — CBC

## 2021-10-29 LAB — HEPATIC FUNCTION PANEL
ALT: 18 IU/L (ref 0–32)
AST: 15 IU/L (ref 0–40)
Albumin: 4.1 g/dL (ref 3.8–4.9)
Alkaline Phosphatase: 70 IU/L (ref 44–121)
Bilirubin Total: 0.5 mg/dL (ref 0.0–1.2)
Bilirubin, Direct: 0.13 mg/dL (ref 0.00–0.40)
Total Protein: 6.9 g/dL (ref 6.0–8.5)

## 2021-10-29 LAB — MICROALBUMIN / CREATININE URINE RATIO

## 2021-10-31 ENCOUNTER — Telehealth: Payer: Self-pay

## 2021-10-31 NOTE — Telephone Encounter (Signed)
[facility] called and wants [place] to redraw for the CBC test ordered and we will collect urine for the microalbumin test also. I spoke with patient and she is aware.

## 2021-10-31 NOTE — Telephone Encounter (Signed)
Noted  

## 2021-10-31 NOTE — Addendum Note (Signed)
Addended by: Lynnea Ferrier on: 10/31/2021 02:50 PM   Modules accepted: Orders

## 2021-10-31 NOTE — Addendum Note (Signed)
Addended by: Lynnea Ferrier on: 10/31/2021 02:48 PM   Modules accepted: Orders

## 2021-11-03 ENCOUNTER — Other Ambulatory Visit: Payer: Self-pay | Admitting: Cardiology

## 2021-11-08 ENCOUNTER — Other Ambulatory Visit (INDEPENDENT_AMBULATORY_CARE_PROVIDER_SITE_OTHER): Payer: BC Managed Care – PPO

## 2021-11-08 ENCOUNTER — Encounter (INDEPENDENT_AMBULATORY_CARE_PROVIDER_SITE_OTHER): Payer: Self-pay | Admitting: Family Medicine

## 2021-11-08 ENCOUNTER — Telehealth (INDEPENDENT_AMBULATORY_CARE_PROVIDER_SITE_OTHER): Payer: BC Managed Care – PPO | Admitting: Family Medicine

## 2021-11-08 DIAGNOSIS — E7849 Other hyperlipidemia: Secondary | ICD-10-CM | POA: Diagnosis not present

## 2021-11-08 DIAGNOSIS — Z6841 Body Mass Index (BMI) 40.0 and over, adult: Secondary | ICD-10-CM

## 2021-11-08 DIAGNOSIS — Z0001 Encounter for general adult medical examination with abnormal findings: Secondary | ICD-10-CM

## 2021-11-08 DIAGNOSIS — R7303 Prediabetes: Secondary | ICD-10-CM | POA: Diagnosis not present

## 2021-11-08 DIAGNOSIS — E1169 Type 2 diabetes mellitus with other specified complication: Secondary | ICD-10-CM | POA: Diagnosis not present

## 2021-11-08 DIAGNOSIS — E669 Obesity, unspecified: Secondary | ICD-10-CM

## 2021-11-08 LAB — MICROALBUMIN / CREATININE URINE RATIO
Creatinine,U: 71.2 mg/dL
Microalb Creat Ratio: 2 mg/g (ref 0.0–30.0)
Microalb, Ur: 1.4 mg/dL (ref 0.0–1.9)

## 2021-11-08 LAB — CBC
HCT: 41.2 % (ref 36.0–46.0)
Hemoglobin: 13.7 g/dL (ref 12.0–15.0)
MCHC: 33.2 g/dL (ref 30.0–36.0)
MCV: 86 fl (ref 78.0–100.0)
Platelets: 262 10*3/uL (ref 150.0–400.0)
RBC: 4.79 Mil/uL (ref 3.87–5.11)
RDW: 13.5 % (ref 11.5–15.5)
WBC: 5.5 10*3/uL (ref 4.0–10.5)

## 2021-11-08 MED ORDER — METFORMIN HCL 500 MG PO TABS: 500.0000 mg | ORAL_TABLET | Freq: Every day | ORAL | 0 refills | Status: DC

## 2021-11-10 NOTE — Progress Notes (Signed)
TeleHealth Visit:  Due to the COVID-19 pandemic, this visit was completed with telemedicine (audio/video) technology to reduce patient and provider exposure as well as to preserve personal protective equipment.   Madison Palmer has verbally consented to this TeleHealth visit. The patient is located at home, the provider is located at the [facility] office. The participants in this visit include the listed provider and patient. The visit was conducted today via Mychart Video.  Chief Complaint: OBESITY Madison Palmer is here to discuss her progress with her obesity treatment plan along with follow-up of her obesity related diagnoses. Madison Palmer is on keeping a food journal and adhering to recommended goals of 1550-1700 calories and 100+ grams of protein and states she is following her eating plan approximately 100% of the time. Madison Palmer states she is doing pool exercises 15 minutes 1 times per week.  Today's visit was #: 72 Starting weight: 357 lbs Starting date: 02/27/2018  Interim History: Madison Palmer doing Mychart today due to GI issues---frequent urination with loose stools. Prior to today she has been feeling well. She is trying to get mental and emotional side of herself motivated to stay consistent. She did discuss with [facility] surgeon options for bariatric surgery. She is journaling daily and averaging about 1550-1700 calories (wk avg 1742 cal last wk). Protein always over 100 grams.  Subjective:   1. Prediabetes Madison Palmer is currently taking Metformin with occasional GI side effects, she does not think it's related.  2. Other hyperlipidemia Madison Palmer is currently taking Lipitor without transaminitis. Her last LDL of 102, HDL of 48, trigly 132.  Assessment/Plan:   1. Prediabetes We will refill Metformin 500 mg by mouth daily for 1 month with 0 refills.  -Refill metFORMIN (GLUCOPHAGE) 500 MG tablet; Take 1 tablet (500 mg total) by mouth daily with breakfast.  Dispense: 90 tablet; Refill: 0  2.  Other hyperlipidemia Continue taking Lipitor. Labs obtained per PCP in 3-6 months.  3. Obesity with current BMI is 58.7 Madison Palmer is currently in the action stage of change. As such, her goal is to continue with weight loss efforts. She has agreed to keeping a food journal and adhering to recommended goals of 1550-1700 calories and 100+ grams of protein daily   Exercise goals: Encouraged Madison Palmer to increase pool exercise 3 times a week.  Behavioral modification strategies: increasing lean protein intake, meal planning and cooking strategies, keeping healthy foods in the home, and planning for success.  Madison Palmer has agreed to follow-up with our clinic in 4 weeks. She was informed of the importance of frequent follow-up visits to maximize her success with intensive lifestyle modifications for her multiple health conditions.  Objective:   VITALS: Per patient if applicable, see vitals. GENERAL: Alert and in no acute distress. CARDIOPULMONARY: No increased WOB. Speaking in clear sentences.  PSYCH: Pleasant and cooperative. Speech normal rate and rhythm. Affect is appropriate. Insight and judgement are appropriate. Attention is focused, linear, and appropriate.  NEURO: Oriented as arrived to appointment on time with no prompting.   Lab Results  Component Value Date   CREATININE 15.0 (A) 09/08/2021   BUN 36 (H) 08/22/2021   NA 145 09/08/2021   K 4.2 09/08/2021   CL 104 09/08/2021   CO2 23 (A) 09/08/2021   Lab Results  Component Value Date   ALT 18 10/28/2021   AST 15 10/28/2021   ALKPHOS 70 10/28/2021   BILITOT 0.5 10/28/2021   Lab Results  Component Value Date   HGBA1C 5.6 04/12/2021  HGBA1C 5.6 06/01/2020   HGBA1C 5.7 (H) 12/11/2019   HGBA1C 5.7 (H) 12/24/2018   HGBA1C 6.0 (H) 02/27/2018   Lab Results  Component Value Date   INSULIN 18.9 06/01/2020   INSULIN 11.4 12/11/2019   INSULIN 21.1 12/30/2018   INSULIN 9.6 02/27/2018   Lab Results  Component Value Date   TSH 2.47  09/08/2021   Lab Results  Component Value Date   CHOL 173 10/28/2021   HDL 48 10/28/2021   LDLCALC 102 (H) 10/28/2021   TRIG 132 10/28/2021   CHOLHDL 3.6 10/28/2021   Lab Results  Component Value Date   VD25OH 34.4 09/08/2021   VD25OH 29.0 (L) 09/01/2020   VD25OH 19.7 (L) 06/01/2020   Lab Results  Component Value Date   WBC 5.5 11/08/2021   HGB 13.7 11/08/2021   HCT 41.2 11/08/2021   MCV 86.0 11/08/2021   PLT 262.0 11/08/2021   Lab Results  Component Value Date   IRON 44 02/20/2018   TIBC 347 05/29/2017    Attestation Statements:   Reviewed by clinician on day of visit: allergies, medications, problem list, medical history, surgical history, family history, social history, and previous encounter notes.  I, Elnora Morrison, RMA am acting as transcriptionist for Coralie Common, MD.  I have reviewed the above documentation for accuracy and completeness, and I agree with the above. - Coralie Common, MD

## 2021-11-28 ENCOUNTER — Encounter: Payer: Self-pay | Admitting: Pharmacist

## 2021-12-02 ENCOUNTER — Other Ambulatory Visit: Payer: Self-pay | Admitting: Nurse Practitioner

## 2021-12-02 ENCOUNTER — Other Ambulatory Visit (HOSPITAL_BASED_OUTPATIENT_CLINIC_OR_DEPARTMENT_OTHER): Payer: Self-pay | Admitting: Pharmacist Clinician (PhC)/ Clinical Pharmacy Specialist

## 2021-12-02 DIAGNOSIS — Z1231 Encounter for screening mammogram for malignant neoplasm of breast: Secondary | ICD-10-CM

## 2021-12-02 MED ORDER — CLONIDINE HCL 0.1 MG PO TABS: 0.1000 mg | ORAL_TABLET | Freq: Three times a day (TID) | ORAL | 11 refills | Status: DC

## 2021-12-04 NOTE — Progress Notes (Signed)
Cardiology Office Note   Date:  12/05/2021   ID:  Madison Palmer, DOB [birth year], MRN [id number]  PCP:  Madison Buffy, NP  Cardiologist:   Madison Breeding, MD   Chief Complaint  Patient presents with   LVH       History of Present Illness: Madison Palmer is a 56 y.o. female who presents for follow up of dyspnea.   She had an echo in Nov with a normal EF of 60 - 65% with severe concentric LVH.   She also is noted to have a past history of difficult to control hypertension.  She moved here from [place].  She has had LVH with preserved ejection fraction.  She had some mild internal carotid artery plaque noted on Doppler in the past.  She had a perfusion study in 2018 that demonstrated no evidence of ischemia or infarct.  She is been managed for her hypertension.     She has hypercalcemia.  This is managed at [facility].  She has done well since I saw her otherwise.  She has had less shortness of breath.  She is not having any new PND or orthopnea.  She has had no increased swelling.  Unfortunately despite extensive efforts her weight is gone up and she is thinking about bariatric surgery.  She has had some cough at night with drainage.  Her blood pressure today is well controlled.  The last thing she was told because it was still averaging in the 150s was to increase her clonidine p.o. to 3 times daily but she has not yet started this.  She think she is can probably have trouble remembering to do this.   Past Medical History:  Diagnosis Date   Acute on chronic respiratory failure with hypoxia and hypercapnia (HCC) 09/15/2018   Anemia    Anxiety    Arrhythmia    tachycardia   Arthritis    Bipolar 1 disorder ([place]) 09/12/2018   Bipolar affective disorder, manic, severe, with psychotic behavior ([place]) 08/28/2018   ? If it is related to underlying medical issues: parathyroid disorder ? Steroid induced.  Formatting of this note might be different from the original. ? If it is related to  underlying medical issues: parathyroid disorder ? Steroid induced.   Bipolar I disorder, current or most recent episode manic, with psychotic features (Madison Palmer)    Brief psychotic disorder (Madison Palmer) 08/22/2018   Chickenpox    Chronic respiratory failure with hypoxia ([place]) 05/10/2018   Formatting of this note might be different from the original. Last Assessment & Plan:  Continue 1 L continuous on exertion and during sleep. On her next visit we will check OSA on CPAP/room air to decide whether she needs to take oxygen along with her on her cruise in May   Depression    Diverticulitis    Generalized edema 03/12/2018   GERD (gastroesophageal reflux disease)    Glaucoma    Hyperlipidemia    Hyperparathyroidism (Madison Palmer)    Hypertension    Inflammatory polyps of colon ([place])    Leg swelling 01/14/2019   LVH (left ventricular hypertrophy)    Lymphedema    Morbid (severe) obesity due to excess calories (Madison Palmer) 05/11/2017   PCOS (polycystic ovarian syndrome)    Prediabetes    Recurrent UTI    Sleep apnea    CPAP   Thyroid disease    Vitamin D deficiency    Weight gain 01/25/2018    Past Surgical History:  Procedure Laterality  Date   BREAST BIOPSY  2015   CESAREAN SECTION  2004   INNER EAR SURGERY     ear and sinus surgery   LAPAROSCOPIC REPAIR AND REMOVAL OF GASTRIC BAND     OOPHORECTOMY Left    TONSILLECTOMY AND ADENOIDECTOMY       Current Outpatient Medications  Medication Sig Dispense Refill   acetaminophen (TYLENOL) 500 MG tablet Take 1,000 mg by mouth 3 (three) times daily as needed for moderate pain or headache.     amLODipine (NORVASC) 10 MG tablet Take 1 tablet (10 mg total) by mouth daily. 90 tablet 3   atorvastatin (LIPITOR) 20 MG tablet TAKE 1 TABLET(20 MG) BY MOUTH 3 TIMES A WEEK 12 tablet 5   cetirizine (ZYRTEC ALLERGY) 10 MG tablet Take 1 tablet (10 mg total) by mouth daily. 90 tablet 1   Cholecalciferol (VITAMIN D3 MAXIMUM STRENGTH) 125 MCG (5000 UT) capsule Take 5,000 Units by  mouth daily.     cinacalcet (SENSIPAR) 30 MG tablet Take 30 mg by mouth daily.     cloNIDine (CATAPRES) 0.1 MG tablet Take 1 tablet (0.1 mg total) by mouth 3 (three) times daily. 90 tablet 11   esomeprazole (NEXIUM) 40 MG capsule TAKE 1 CAPSULE BY MOUTH TWICE DAILY 180 capsule 2   fenofibrate (TRICOR) 145 MG tablet Take 1 tablet (145 mg total) by mouth daily. 90 tablet 3   furosemide (LASIX) 40 MG tablet TAKE 1 TABLET(40 MG) BY MOUTH DAILY 90 tablet 3   gabapentin (NEURONTIN) 100 MG capsule TAKE 2 CAPSULES(200 MG) BY MOUTH AT BEDTIME 180 capsule 3   labetalol (NORMODYNE) 300 MG tablet Take 1.5 tablets (450 mg total) by mouth 2 (two) times daily. 270 tablet 3   levothyroxine (SYNTHROID) 88 MCG tablet Take 88 mcg by mouth daily.     lisinopril (ZESTRIL) 40 MG tablet TAKE 1 TABLET(40 MG) BY MOUTH DAILY 90 tablet 3   lurasidone (LATUDA) 40 MG TABS tablet Take 1 tablet (40 mg total) by mouth daily with breakfast. 30 tablet 2   metFORMIN (GLUCOPHAGE) 500 MG tablet Take 1 tablet (500 mg total) by mouth daily with breakfast. 90 tablet 0   Polyethylene Glycol 3350 (MIRALAX PO) Take by mouth.     Potassium Chloride ER 20 MEQ TBCR TAKE 1 TABLET BY MOUTH EVERY DAY 90 tablet 2   TART CHERRY PO Take by mouth daily.     No current facility-administered medications for this visit.    Allergies:   Other, Fentanyl, Levofloxacin, Midazolam, Pollen extract, Atorvastatin, Hydralazine hcl, and Rosuvastatin    ROS:  Please see the history of present illness.   Otherwise, review of systems are positive for none.    All other systems are reviewed and negative.    PHYSICAL EXAM: VS:  BP 118/66   Pulse 71   Ht '5\' 4"'$  (1.626 m)   Wt (!) 341 lb 6.4 oz (154.9 kg)   LMP 05/02/2017   SpO2 90%   BMI 58.60 kg/m  , BMI Body mass index is 58.6 kg/m. GEN:  No distress NECK:  No jugular venous distention at 90 degrees, waveform within normal limits, carotid upstroke brisk and symmetric, no bruits, no  thyromegaly LYMPHATICS:  No cervical adenopathy LUNGS:  Clear to auscultation bilaterally BACK:  No CVA tenderness CHEST:  Unremarkable HEART:  S1 and S2 within normal limits, no S3, no S4, no clicks, no rubs, no murmurs ABD:  Positive bowel sounds normal in frequency in pitch, no bruits, no rebound,  no guarding, unable to assess midline mass or bruit with the patient seated. EXT:  2 plus pulses throughout, moderate edema, no cyanosis no clubbing SKIN:  No rashes no nodules NEURO:  Cranial nerves II through XII grossly intact, motor grossly intact throughout PSYCH:  Cognitively intact, oriented to person place and time   EKG:  EKG is  ordered today. Sinus rhythm, rate 71, left axis deviation, poor anterior R wave progression, no acute ST changes.  Recent Labs: 08/22/2021: BUN 36 09/08/2021: Creatinine 15.0; Potassium 4.2; Sodium 145; TSH 2.47 10/28/2021: ALT 18 11/08/2021: Hemoglobin 13.7; Platelets 262.0      Wt Readings from Last 3 Encounters:  12/05/21 (!) 341 lb 6.4 oz (154.9 kg)  10/28/21 (!) 343 lb 3.2 oz (155.7 kg)  10/10/21 (!) 341 lb (154.7 kg)      Other studies Reviewed: Additional studies/ records that were reviewed today include: Labs Review of the above records demonstrates:  Please see elsewhere in the note.     ASSESSMENT AND PLAN:  HTN: She is being followed in our Pharm [facility] Clinic and I have sent a message to them to see if we might be able to switch her to a Catapres patch which might be easier.   PULMONARY NODULES:   She has had multiple nodules followed by Dr. Elsworth Soho  LVH:  PYP scan was negative.  I am going to repeat an echo.   EDEMA:   This is much improved.  She will continue the meds as listed.  CHRONIC DIASTOLIC HF:   She seems to be euvolemic.  No change in therapy.   OBESITY:   She is considering bariatric surgery and I would agree with this as an very good idea if her team agrees.   CORONARY CALCIUM:    She had no chest discomfort.  We are  practicing risk reduction.  SLEEP APNEA:   She wears CPAP.  No change in therapy.   CAROTID STENOSIS :   She had very mild plaque two years ago.  No further imaging.  DYSLIPIDEMIA:    LDL was elevated but she has not tolerated statins.  She will continue the meds as listed.  138 earlier this year but she does not tolerate any statin.  She will continue with her Tricor.   Current medicines are reviewed at length with the patient today.  The patient does not have concerns regarding medicines.  The following changes have been made:  None  Labs/ tests ordered today include: None  Orders Placed This Encounter  Procedures   EKG 12-Lead   ECHOCARDIOGRAM COMPLETE      Disposition:   FU with me in 6 months.      Signed, Madison Breeding, MD  12/05/2021 12:26 PM    [facility]

## 2021-12-05 ENCOUNTER — Ambulatory Visit (INDEPENDENT_AMBULATORY_CARE_PROVIDER_SITE_OTHER): Payer: BC Managed Care – PPO | Admitting: Cardiology

## 2021-12-05 ENCOUNTER — Encounter: Payer: Self-pay | Admitting: Cardiology

## 2021-12-05 VITALS — BP 118/66 | HR 71 | Ht 64.0 in | Wt 341.4 lb

## 2021-12-05 DIAGNOSIS — I5032 Chronic diastolic (congestive) heart failure: Secondary | ICD-10-CM | POA: Diagnosis not present

## 2021-12-05 DIAGNOSIS — I517 Cardiomegaly: Secondary | ICD-10-CM | POA: Diagnosis not present

## 2021-12-05 DIAGNOSIS — R931 Abnormal findings on diagnostic imaging of heart and coronary circulation: Secondary | ICD-10-CM | POA: Diagnosis not present

## 2021-12-05 DIAGNOSIS — M7989 Other specified soft tissue disorders: Secondary | ICD-10-CM | POA: Diagnosis not present

## 2021-12-05 NOTE — Patient Instructions (Signed)
  Testing/Procedures:  Your physician has requested that you have an echocardiogram. Echocardiography is a painless test that uses sound waves to create images of your heart. It provides your doctor with information about the size and shape of your heart and how well your heart's chambers and valves are working. This procedure takes approximately one hour. There are no restrictions for this procedure. [place]   Follow-Up: At [facility], you and your health needs are our priority.  As part of our continuing mission to provide you with exceptional heart care, we have created designated Provider Care Teams.  These Care Teams include your primary Cardiologist (physician) and Advanced Practice Providers (APPs -  Physician Assistants and Nurse Practitioners) who all work together to provide you with the care you need, when you need it.  We recommend signing up for the patient portal called "MyChart".  Sign up information is provided on this After Visit Summary.  MyChart is used to connect with patients for Virtual Visits (Telemedicine).  Patients are able to view lab/test results, encounter notes, upcoming appointments, etc.  Non-urgent messages can be sent to your provider as well.   To learn more about what you can do with MyChart, go to [email or web address].    Your next appointment:   2 month(s)  The format for your next appointment:   In Person  Provider:   PHARM MD IN THE HTN CLINIC  Your physician wants you to follow-up in: [place] will receive a reminder letter in the mail two months in advance. If you don't receive a letter, please call our office to schedule the follow-up appointment.

## 2021-12-07 ENCOUNTER — Encounter (INDEPENDENT_AMBULATORY_CARE_PROVIDER_SITE_OTHER): Payer: Self-pay

## 2021-12-08 ENCOUNTER — Encounter (INDEPENDENT_AMBULATORY_CARE_PROVIDER_SITE_OTHER): Payer: Self-pay | Admitting: Family Medicine

## 2021-12-08 ENCOUNTER — Ambulatory Visit (INDEPENDENT_AMBULATORY_CARE_PROVIDER_SITE_OTHER): Payer: BC Managed Care – PPO | Admitting: Family Medicine

## 2021-12-08 VITALS — BP 131/78 | HR 82 | Temp 98.2°F | Ht 64.0 in | Wt 338.0 lb

## 2021-12-08 DIAGNOSIS — R0602 Shortness of breath: Secondary | ICD-10-CM | POA: Diagnosis not present

## 2021-12-08 DIAGNOSIS — E669 Obesity, unspecified: Secondary | ICD-10-CM

## 2021-12-08 DIAGNOSIS — I10 Essential (primary) hypertension: Secondary | ICD-10-CM

## 2021-12-08 DIAGNOSIS — Z6841 Body Mass Index (BMI) 40.0 and over, adult: Secondary | ICD-10-CM

## 2021-12-11 ENCOUNTER — Other Ambulatory Visit: Payer: Self-pay | Admitting: Cardiology

## 2021-12-11 ENCOUNTER — Other Ambulatory Visit: Payer: Self-pay | Admitting: Nurse Practitioner

## 2021-12-11 DIAGNOSIS — E782 Mixed hyperlipidemia: Secondary | ICD-10-CM

## 2021-12-12 NOTE — Telephone Encounter (Signed)
Chart supports Rx Last OV: 09/2021 Next OV: 03/2022  

## 2021-12-15 NOTE — Progress Notes (Signed)
Chief Complaint:   OBESITY Madison Palmer is here to discuss her progress with her obesity treatment plan along with follow-up of her obesity related diagnoses. Madison Palmer is on keeping a food journal and adhering to recommended goals of 1550-1700 calories and 100+ grams of protein and states she is following her eating plan approximately 100% of the time. Madison Palmer states she is exercising 0 minutes 0 times per week.  Today's visit was #: 71 Starting weight: 357 lbs Starting date: 02/27/2018 Today's weight: 338 lbs Today's date: 12/08/2021 Total lbs lost to date: 19 lbs Total lbs lost since last in-office visit: 3  Interim History: Madison Palmer has had a job interview which was her first interview in 65 years. She has not gotten a job yet but feels proud she gained momentum and initiative/motivation to apply and interview. She journaled everyday and hit goals of protein and calories 5/7 days. She has pursued bariatric surgery. Has a few out of town trips planned.  Subjective:   1. Essential hypertension Madison Palmer's blood pressure is well controlled today. She is currently taking Lisinopril, Labetalol, Norvasc and Clonidine. Denies chest pain, chest pressure and headache.  2. SOBOE (shortness of breath on exertion) Madison Palmer's previous RMR was of 2146 on 11/15/20. Symptoms worsened since last IC.  Assessment/Plan:   1. Essential hypertension Madison Palmer will continue with current medications without any changes in doses.  2. SOBOE (shortness of breath on exertion) Madison Palmer's RMR of 2246.  3. Obesity with current BMI is 58.2 Madison Palmer is currently in the action stage of change. As such, her goal is to continue with weight loss efforts. She has agreed to keeping a food journal and adhering to recommended goals of 1800-2000 calories and 150+ grams of protein Daily.   Exercise goals: All adults should avoid inactivity. Some physical activity is better than none, and adults who participate in any amount of physical  activity gain some health benefits.  Behavioral modification strategies: increasing lean protein intake, meal planning and cooking strategies, and keeping healthy foods in the home.  Madison Palmer has agreed to follow-up with our clinic in 4 weeks. She was informed of the importance of frequent follow-up visits to maximize her success with intensive lifestyle modifications for her multiple health conditions.   Objective:   Blood pressure 131/78, pulse 82, temperature 98.2 F (36.8 C), height '5\' 4"'$  (1.626 m), weight (!) 338 lb (153.3 kg), last menstrual period 05/02/2017, SpO2 94 %. Body mass index is 58.02 kg/m.  General: Cooperative, alert, well developed, in no acute distress. HEENT: Conjunctivae and lids unremarkable. Cardiovascular: Regular rhythm.  Lungs: Normal work of breathing. Neurologic: No focal deficits.   Lab Results  Component Value Date   CREATININE 15.0 (A) 09/08/2021   BUN 36 (H) 08/22/2021   NA 145 09/08/2021   K 4.2 09/08/2021   CL 104 09/08/2021   CO2 23 (A) 09/08/2021   Lab Results  Component Value Date   ALT 18 10/28/2021   AST 15 10/28/2021   ALKPHOS 70 10/28/2021   BILITOT 0.5 10/28/2021   Lab Results  Component Value Date   HGBA1C 5.6 04/12/2021   HGBA1C 5.6 06/01/2020   HGBA1C 5.7 (H) 12/11/2019   HGBA1C 5.7 (H) 12/24/2018   HGBA1C 6.0 (H) 02/27/2018   Lab Results  Component Value Date   INSULIN 18.9 06/01/2020   INSULIN 11.4 12/11/2019   INSULIN 21.1 12/30/2018   INSULIN 9.6 02/27/2018   Lab Results  Component Value Date   TSH 2.47 09/08/2021  Lab Results  Component Value Date   CHOL 173 10/28/2021   HDL 48 10/28/2021   LDLCALC 102 (H) 10/28/2021   TRIG 132 10/28/2021   CHOLHDL 3.6 10/28/2021   Lab Results  Component Value Date   VD25OH 34.4 09/08/2021   VD25OH 29.0 (L) 09/01/2020   VD25OH 19.7 (L) 06/01/2020   Lab Results  Component Value Date   WBC 5.5 11/08/2021   HGB 13.7 11/08/2021   HCT 41.2 11/08/2021   MCV 86.0  11/08/2021   PLT 262.0 11/08/2021   Lab Results  Component Value Date   IRON 44 02/20/2018   TIBC 347 05/29/2017   Attestation Statements:   Reviewed by clinician on day of visit: allergies, medications, problem list, medical history, surgical history, family history, social history, and previous encounter notes.  I, Elnora Morrison, RMA am acting as transcriptionist for Coralie Common, MD.  I have reviewed the above documentation for accuracy and completeness, and I agree with the above. - Coralie Common, MD

## 2021-12-16 ENCOUNTER — Other Ambulatory Visit: Payer: Self-pay | Admitting: Nurse Practitioner

## 2021-12-16 DIAGNOSIS — J309 Allergic rhinitis, unspecified: Secondary | ICD-10-CM

## 2021-12-16 NOTE — Telephone Encounter (Signed)
Chart supports Rx Last OV: 09/2021 Next OV: 03/2022  

## 2021-12-20 ENCOUNTER — Ambulatory Visit (HOSPITAL_COMMUNITY): Payer: BC Managed Care – PPO | Attending: Cardiology

## 2021-12-20 DIAGNOSIS — I517 Cardiomegaly: Secondary | ICD-10-CM | POA: Diagnosis present

## 2021-12-20 LAB — ECHOCARDIOGRAM COMPLETE
Area-P 1/2: 3.37 cm2
S' Lateral: 2.6 cm

## 2021-12-20 MED ORDER — PERFLUTREN LIPID MICROSPHERE
1.0000 mL | INTRAVENOUS | Status: AC | PRN
  Administered 2021-12-20: 1 mL via INTRAVENOUS

## 2021-12-27 ENCOUNTER — Encounter: Payer: Self-pay | Admitting: *Deleted

## 2021-12-27 ENCOUNTER — Other Ambulatory Visit: Payer: Self-pay | Admitting: *Deleted

## 2021-12-27 DIAGNOSIS — I517 Cardiomegaly: Secondary | ICD-10-CM

## 2022-01-05 ENCOUNTER — Ambulatory Visit (INDEPENDENT_AMBULATORY_CARE_PROVIDER_SITE_OTHER): Payer: BC Managed Care – PPO | Admitting: Family Medicine

## 2022-01-05 ENCOUNTER — Telehealth (INDEPENDENT_AMBULATORY_CARE_PROVIDER_SITE_OTHER): Payer: BC Managed Care – PPO | Admitting: Family Medicine

## 2022-01-05 ENCOUNTER — Encounter (INDEPENDENT_AMBULATORY_CARE_PROVIDER_SITE_OTHER): Payer: Self-pay | Admitting: Family Medicine

## 2022-01-05 VITALS — BP 148/72 | HR 78 | Temp 98.5°F | Ht 64.0 in | Wt 340.0 lb

## 2022-01-05 DIAGNOSIS — E669 Obesity, unspecified: Secondary | ICD-10-CM | POA: Diagnosis not present

## 2022-01-05 DIAGNOSIS — Z6841 Body Mass Index (BMI) 40.0 and over, adult: Secondary | ICD-10-CM

## 2022-01-05 DIAGNOSIS — I10 Essential (primary) hypertension: Secondary | ICD-10-CM

## 2022-01-09 ENCOUNTER — Ambulatory Visit
Admission: RE | Admit: 2022-01-09 | Discharge: 2022-01-09 | Disposition: A | Discharge status: Home / Self Care | Payer: BC Managed Care – PPO | Source: Ambulatory Visit | Attending: Nurse Practitioner | Admitting: Nurse Practitioner

## 2022-01-09 DIAGNOSIS — Z1231 Encounter for screening mammogram for malignant neoplasm of breast: Secondary | ICD-10-CM

## 2022-01-10 NOTE — Progress Notes (Signed)
Chief Complaint:   OBESITY Madison Palmer is here to discuss her progress with her obesity treatment plan along with follow-up of her obesity related diagnoses. Madison Palmer is on keeping a food journal and adhering to recommended goals of 1800-2000 calories and 150+ grams of protein and states she is following her eating plan approximately 100% of the time. Madison Palmer states she is walking more.  Today's visit was #: 55 Starting weight: 357 lbs Starting date: 02/27/2018 Today's weight: 340 lbs Today's date: 01/05/2022 Total lbs lost to date: 17 lbs Total lbs lost since last in-office visit: 0  Interim History: Madison Palmer has had some appointments and activities since last appointment. Has not developed routine to get new dietary goals. Has not eaten 3 meals a day and sometimes is not eating until 3-4 pm. Was able to get 100 g of protein but never 150g. Not surprised with weight gain. Appetite is not present. Noticing increase craving/desires for salty crunchy foods. Does hope she will be able to go to the beach.  Subjective:   1. Essential hypertension Madison Palmer's blood pressure slightly elevated today. Denies chest pain, chest pressure and headache.  Assessment/Plan:   1. Essential hypertension Continue current medications without changes in dose or medications. Follow up with cardiology for medication management.  2. Obesity with current BMI is 58.4 Madison Palmer is currently in the action stage of change. As such, her goal is to continue with weight loss efforts. She has agreed to keeping a food journal and adhering to recommended goals of 1800-2000 calories and 150+ grams of protein daily.   Exercise goals: All adults should avoid inactivity. Some physical activity is better than none, and adults who participate in any amount of physical activity gain some health benefits.  Behavioral modification strategies: increasing lean protein intake, meal planning and cooking strategies, keeping healthy foods in the  home, planning for success, and keeping a strict food journal.  Madison Palmer has agreed to follow-up with our clinic in 4 weeks. She was informed of the importance of frequent follow-up visits to maximize her success with intensive lifestyle modifications for her multiple health conditions.   Objective:   Blood pressure (!) 148/72, pulse 78, temperature 98.5 F (36.9 C), height '5\' 4"'$  (1.626 m), weight (!) 340 lb (154.2 kg), last menstrual period 05/02/2017, SpO2 96 %. Body mass index is 58.36 kg/m.  General: Cooperative, alert, well developed, in no acute distress. HEENT: Conjunctivae and lids unremarkable. Cardiovascular: Regular rhythm.  Lungs: Normal work of breathing. Neurologic: No focal deficits.   Lab Results  Component Value Date   CREATININE 15.0 (A) 09/08/2021   BUN 36 (H) 08/22/2021   NA 145 09/08/2021   K 4.2 09/08/2021   CL 104 09/08/2021   CO2 23 (A) 09/08/2021   Lab Results  Component Value Date   ALT 18 10/28/2021   AST 15 10/28/2021   ALKPHOS 70 10/28/2021   BILITOT 0.5 10/28/2021   Lab Results  Component Value Date   HGBA1C 5.6 04/12/2021   HGBA1C 5.6 06/01/2020   HGBA1C 5.7 (H) 12/11/2019   HGBA1C 5.7 (H) 12/24/2018   HGBA1C 6.0 (H) 02/27/2018   Lab Results  Component Value Date   INSULIN 18.9 06/01/2020   INSULIN 11.4 12/11/2019   INSULIN 21.1 12/30/2018   INSULIN 9.6 02/27/2018   Lab Results  Component Value Date   TSH 2.47 09/08/2021   Lab Results  Component Value Date   CHOL 173 10/28/2021   HDL 48 10/28/2021   LDLCALC 102 (  H) 10/28/2021   TRIG 132 10/28/2021   CHOLHDL 3.6 10/28/2021   Lab Results  Component Value Date   VD25OH 34.4 09/08/2021   VD25OH 29.0 (L) 09/01/2020   VD25OH 19.7 (L) 06/01/2020   Lab Results  Component Value Date   WBC 5.5 11/08/2021   HGB 13.7 11/08/2021   HCT 41.2 11/08/2021   MCV 86.0 11/08/2021   PLT 262.0 11/08/2021   Lab Results  Component Value Date   IRON 44 02/20/2018   TIBC 347 05/29/2017    Attestation Statements:   Reviewed by clinician on day of visit: allergies, medications, problem list, medical history, surgical history, family history, social history, and previous encounter notes.  I, Elnora Morrison, RMA am acting as transcriptionist for Coralie Common, MD.  I have reviewed the above documentation for accuracy and completeness, and I agree with the above. - Coralie Common, MD

## 2022-01-11 ENCOUNTER — Encounter (HOSPITAL_COMMUNITY): Payer: Self-pay | Admitting: Psychiatry

## 2022-01-11 ENCOUNTER — Telehealth (HOSPITAL_BASED_OUTPATIENT_CLINIC_OR_DEPARTMENT_OTHER): Payer: BC Managed Care – PPO | Admitting: Psychiatry

## 2022-01-11 VITALS — Wt 340.0 lb

## 2022-01-11 DIAGNOSIS — F419 Anxiety disorder, unspecified: Secondary | ICD-10-CM

## 2022-01-11 DIAGNOSIS — R251 Tremor, unspecified: Secondary | ICD-10-CM

## 2022-01-11 DIAGNOSIS — F319 Bipolar disorder, unspecified: Secondary | ICD-10-CM | POA: Diagnosis not present

## 2022-01-11 MED ORDER — LURASIDONE HCL 40 MG PO TABS: 40.0000 mg | ORAL_TABLET | Freq: Every day | ORAL | 2 refills | Status: DC

## 2022-01-11 MED ORDER — BENZTROPINE MESYLATE 0.5 MG PO TABS: 0.5000 mg | ORAL_TABLET | Freq: Every day | ORAL | 2 refills | Status: DC

## 2022-01-11 NOTE — Progress Notes (Signed)
Virtual Visit via Telephone Note  I connected with Madison Palmer on 01/11/22 at 10:00 AM EDT by telephone and verified that I am speaking with the correct person using two identifiers.  Location: Patient: Home Provider: Home Office   I discussed the limitations, risks, security and privacy concerns of performing an evaluation and management service by telephone and the availability of in person appointments. I also discussed with the patient that there may be a patient responsible charge related to this service. The patient expressed understanding and agreed to proceed.   History of Present Illness: Patient is evaluated by phone session.  He is taking all her medication as prescribed.  She noticed having tremors mostly in her hands.  Patient told these tremors sometime because issue in daily functioning.  However her mood symptoms are stable.  She denies any mania, psychosis, hallucination.  She denies any panic attack.  Her son is taking online classes so he can go to college.  She is hoping starting spring 2024 he may be eligible to take college classes.  Patient recently had a visit to her PCP and now she is taking third medication for blood pressure.  She reported her blood pressure is somewhat better than before.  She uses CPAP which helps her sleep.  Despite watching her calorie intake her weight is unchanged from the past.  She admitted that she has to do exercise but due to her knee pain has not able to walk or exercise as much.  She does not want to change the medication but like to address her tremors.  Past psychiatric history; H/O depression and inpatient at [facility] and [facility] in May 20202 for delusion, psychosis and violent behavior. D/C on Depakote 500 mg BID, Latuda 60 mg daily and Trilafon 12 mg a day. No h/o suicidal attempt, nightmares, panic attack, OCD or substance use.     Psychiatric Specialty Exam: Physical Exam  Review of Systems  Weight (!) 340 lb (154.2  kg), last menstrual period 05/02/2017.There is no height or weight on file to calculate BMI.  General Appearance: NA  Eye Contact:  NA  Speech:  Clear and Coherent and Normal Rate  Volume:  Normal  Mood:  Euthymic  Affect:  NA  Thought Process:  Goal Directed  Orientation:  Full (Time, Place, and Person)  Thought Content:  Logical  Suicidal Thoughts:  No  Homicidal Thoughts:  No  Memory:  Immediate;   Good Recent;   Good Remote;   Fair  Judgement:  Intact  Insight:  Present  Psychomotor Activity:  NA  Concentration:  Concentration: Good and Attention Span: Good  Recall:  Good  Fund of Knowledge:  Good  Language:  Good  Akathisia:   sometimes tremors  Handed:  Left  AIMS (if indicated):     Assets:  Communication Skills Desire for Improvement Housing Transportation  ADL's:  Intact  Cognition:  WNL  Sleep:   good with CPAP      Assessment and Plan: Bipolar disorder type I.  Anxiety.  Tremors.  I reviewed current medication.  She is taking multiple medication for blood pressure and using CPAP to help her sleep.  She liked the [place] which is helping her mood symptoms.  I recommend to try low-dose Cogentin to help the tremors.  We discussed medication side effect specially dry mouth, constipation and sedation and recommend to take at bedtime.  Encouraged to use CPAP to help her sleep.  Continue Latuda 40 mg  daily.  She is no longer taking Depakote.  Recommended to call us back if she has any question or any concern.  Follow-up in 3 months.  Follow Up Instructions:    I discussed the assessment and treatment plan with the patient. The patient was provided an opportunity to ask questions and all were answered. The patient agreed with the plan and demonstrated an understanding of the instructions.   The patient was advised to call back or seek an in-person evaluation if the symptoms worsen or if the condition fails to improve as anticipated.  Collaboration of Care: Primary  Care Provider AEB notes are available in epic to review  Patient/Guardian was advised Release of Information must be obtained prior to any record release in order to collaborate their care with an outside provider. Patient/Guardian was advised if they have not already done so to contact the registration department to sign all necessary forms in order for [place] to release information regarding their care.   Consent: Patient/Guardian gives verbal consent for treatment and assignment of benefits for services provided during this visit. Patient/Guardian expressed understanding and agreed to proceed.    I provided 24 minutes of non-face-to-face time during this encounter.   Kathlee Nations, MD

## 2022-02-02 ENCOUNTER — Ambulatory Visit (INDEPENDENT_AMBULATORY_CARE_PROVIDER_SITE_OTHER): Payer: BC Managed Care – PPO | Admitting: Family Medicine

## 2022-02-07 ENCOUNTER — Encounter (INDEPENDENT_AMBULATORY_CARE_PROVIDER_SITE_OTHER): Payer: Self-pay | Admitting: Family Medicine

## 2022-02-07 ENCOUNTER — Ambulatory Visit (INDEPENDENT_AMBULATORY_CARE_PROVIDER_SITE_OTHER): Payer: BC Managed Care – PPO | Admitting: Family Medicine

## 2022-02-07 ENCOUNTER — Encounter: Payer: Self-pay | Admitting: Pharmacist Clinician (PhC)/ Clinical Pharmacy Specialist

## 2022-02-07 ENCOUNTER — Ambulatory Visit
Payer: BC Managed Care – PPO | Attending: Cardiovascular Disease | Admitting: Pharmacist Clinician (PhC)/ Clinical Pharmacy Specialist

## 2022-02-07 VITALS — BP 128/80 | HR 75 | Temp 98.0°F | Ht 64.0 in | Wt 341.0 lb

## 2022-02-07 DIAGNOSIS — R7303 Prediabetes: Secondary | ICD-10-CM | POA: Diagnosis not present

## 2022-02-07 DIAGNOSIS — I10 Essential (primary) hypertension: Secondary | ICD-10-CM | POA: Diagnosis not present

## 2022-02-07 DIAGNOSIS — Z6841 Body Mass Index (BMI) 40.0 and over, adult: Secondary | ICD-10-CM | POA: Diagnosis not present

## 2022-02-07 DIAGNOSIS — E669 Obesity, unspecified: Secondary | ICD-10-CM

## 2022-02-07 MED ORDER — METFORMIN HCL 500 MG PO TABS: 500.0000 mg | ORAL_TABLET | Freq: Every day | ORAL | 0 refills | Status: DC

## 2022-02-07 NOTE — Patient Instructions (Signed)
Continue to monitor your blood pressure when seeing other providers.  If it starts trending > 244 systolic, please reach out to me through MyChart  Take your BP meds as follows:  I will try to get clonidine patches covered by your insurance.  I will send you a message in MyChart once I determine coverage for these.    Bring all of your meds, your BP cuff and your record of home blood pressures to your next appointment.  Exercise as you're able, try to walk approximately 30 minutes per day.  Keep salt intake to a minimum, especially watch canned and prepared boxed foods.  Eat more fresh fruits and vegetables and fewer canned items.  Avoid eating in fast food restaurants.    HOW TO TAKE YOUR BLOOD PRESSURE: Rest 5 minutes before taking your blood pressure.  Don't smoke or drink caffeinated beverages for at least 30 minutes before. Take your blood pressure before (not after) you eat. Sit comfortably with your back supported and both feet on the floor (don't cross your legs). Elevate your arm to heart level on a table or a desk. Use the proper sized cuff. It should fit smoothly and snugly around your bare upper arm. There should be enough room to slip a fingertip under the cuff. The bottom edge of the cuff should be 1 inch above the crease of the elbow. Ideally, take 3 measurements at one sitting and record the average.

## 2022-02-07 NOTE — Progress Notes (Unsigned)
02/08/2022 Madison Palmer [birth year] [id number]    HPI: BG is a 56 y/o female with a history of hypertension, arrhythmias, chronic diastolic heart failure, OSA, elevated coronary artery calcium score, GERD, and diabetes, presenting today for hypertension follow-up.   She has been seen multiple times in the past couple of years, and in the past year most of her office readings have been well controlled.  Despite this, she continues to report elevated readings at home.  Her home BP device was found to be accurate last December.  Today she is here after having been at her [facility] appointment.  She notes that Darcel Bayley was stopped at the 10 mg dose because she was not experiencing any weight loss  PMH:  Diabetes -metformin 500 mg daily (A1c 5.6 04/12/2021); Mounjaro d/c 2/2 no weight loss GERD - Nexium '40mg'$  BID HTN - meds seen below   Blood pressure goal < 130/80   Current Medications:  Amlodipine '10mg'$  daily (PM) Labetalol 450 mg BID Lisinopril 40 mg daily (AM) Clonidine 0.'1mg'$  TID (9-12, 5, midnight) Furosemide 40 mg daily (AM)  Family Hx: Adopted, no knowledge of family history   Social Hx: No tobacco, no alcohol, 3-4 cups of coffee per day (either drinks half and half or decaf only), [employer] sweet tea, no soda  Diet: Working with [facility] [employer] [facility] - trying to get 100+ gm protein/day, mostly chicken, will start with protein drinks/bars this week  Exercise: None currently  Home BP Readings: last November her home cuff was found to be > 10 points higher than office cuff Morning Average: 134/76 (7 readings) Evening Average: 149/82 (13 readings)  Intolerances: Rosuvastatin - abdominal pain, diarrhea Atorvastatin - muscle pain  Hydralazine - hypercalcemia Doxycycline - not actually allergic to Fentanyl - hives Levofloxacin - hives Midazolam - hives  Labs:  5/23:  Na 145, K 4.2, Glu 97, SCr 1.15, GFR 56   Wt Readings from Last 3  Encounters:  02/07/22 (!) 341 lb (154.7 kg)  01/11/22 (!) 340 lb (154.2 kg)  01/05/22 (!) 340 lb (154.2 kg)   BP Readings from Last 3 Encounters:  02/07/22 129/74  02/07/22 128/80  01/05/22 (!) 148/72   Pulse Readings from Last 3 Encounters:  02/07/22 75  01/05/22 78  12/08/21 82    Current Outpatient Medications  Medication Sig Dispense Refill   cloNIDine (CATAPRES - DOSED IN MG/24 HR) 0.1 mg/24hr patch Place 1 patch (0.1 mg total) onto the skin once a week. 4 patch 12   acetaminophen (TYLENOL) 500 MG tablet Take 1,000 mg by mouth 3 (three) times daily as needed for moderate pain or headache.     amLODipine (NORVASC) 10 MG tablet Take 1 tablet (10 mg total) by mouth daily. 90 tablet 3   atorvastatin (LIPITOR) 20 MG tablet TAKE 1 TABLET(20 MG) BY MOUTH 3 TIMES A WEEK 12 tablet 5   benztropine (COGENTIN) 0.5 MG tablet Take 1 tablet (0.5 mg total) by mouth at bedtime. 30 tablet 2   cetirizine (ZYRTEC) 10 MG tablet TAKE 1 TABLET BY MOUTH EVERY DAY 90 tablet 1   Cholecalciferol (VITAMIN D3 MAXIMUM STRENGTH) 125 MCG (5000 UT) capsule Take 5,000 Units by mouth daily.     cinacalcet (SENSIPAR) 30 MG tablet Take 30 mg by mouth daily.     esomeprazole (NEXIUM) 40 MG capsule TAKE 1 CAPSULE BY MOUTH TWICE DAILY 180 capsule 2   fenofibrate (TRICOR) 145 MG tablet TAKE 1 TABLET(145 MG) BY MOUTH  DAILY 90 tablet 3   furosemide (LASIX) 40 MG tablet TAKE 1 TABLET(40 MG) BY MOUTH DAILY 90 tablet 3   gabapentin (NEURONTIN) 100 MG capsule TAKE 2 CAPSULES(200 MG) BY MOUTH AT BEDTIME 180 capsule 3   labetalol (NORMODYNE) 300 MG tablet Take 1.5 tablets (450 mg total) by mouth 2 (two) times daily. 270 tablet 3   levothyroxine (SYNTHROID) 88 MCG tablet Take 88 mcg by mouth daily.     lisinopril (ZESTRIL) 40 MG tablet TAKE 1 TABLET(40 MG) BY MOUTH DAILY 90 tablet 3   lurasidone (LATUDA) 40 MG TABS tablet Take 1 tablet (40 mg total) by mouth daily with breakfast. 30 tablet 2   metFORMIN (GLUCOPHAGE) 500 MG  tablet Take 1 tablet (500 mg total) by mouth daily with breakfast. 90 tablet 0   Polyethylene Glycol 3350 (MIRALAX PO) Take by mouth.     Potassium Chloride ER 20 MEQ TBCR TAKE 1 TABLET BY MOUTH EVERY DAY 90 tablet 2   TART CHERRY PO Take by mouth daily.     No current facility-administered medications for this visit.    Allergies  Allergen Reactions   Other     Other reaction(s): Other (See Comments) Instructed not to take by Cardiology.   Fentanyl Hives    Other reaction(s): hives had fentanyl recently with benadryl, did OK   Levofloxacin Hives   Midazolam Hives   Pollen Extract     seasonal   Atorvastatin     Muscle pain in legs  Other reaction(s): LEG PAIN   Hydralazine Hcl Other (See Comments)    Hypercalcemia    Rosuvastatin     Abdominal pain Other reaction(s): diarrhea    Past Medical History:  Diagnosis Date   Acute on chronic respiratory failure with hypoxia and hypercapnia (HCC) 09/15/2018   Anemia    Anxiety    Arrhythmia    tachycardia   Arthritis    Bipolar 1 disorder ([place]) 09/12/2018   Bipolar affective disorder, manic, severe, with psychotic behavior ([employer]) 08/28/2018   ? If it is related to underlying medical issues: parathyroid disorder ? Steroid induced.  Formatting of this note might be different from the original. ? If it is related to underlying medical issues: parathyroid disorder ? Steroid induced.   Bipolar I disorder, current or most recent episode manic, with psychotic features ([place])    Brief psychotic disorder ([place]) 08/22/2018   Chickenpox    Chronic respiratory failure with hypoxia ([place]) 05/10/2018   Formatting of this note might be different from the original. Last Assessment & Plan:  Continue 1 L continuous on exertion and during sleep. On her next visit we will check OSA on CPAP/room air to decide whether she needs to take oxygen along with her on her cruise in May   Depression    Diverticulitis    Generalized edema 03/12/2018   GERD  (gastroesophageal reflux disease)    Glaucoma    Hyperlipidemia    Hyperparathyroidism ([place])    Hypertension    Inflammatory polyps of colon ([place])    Leg swelling 01/14/2019   LVH (left ventricular hypertrophy)    Lymphedema    Morbid (severe) obesity due to excess calories ([place]) 05/11/2017   PCOS (polycystic ovarian syndrome)    Prediabetes    Recurrent UTI    Sleep apnea    CPAP   Thyroid disease    Vitamin D deficiency    Weight gain 01/25/2018    Blood pressure 129/74, last menstrual period 05/02/2017.  Essential hypertension Patient with resistant hypertension, currently doing well in office visits with only 4 of 17 readings > 396 systolic when in medical offices this year.  Home cuff was found to be 10 points higher last year, so unsure if cuff is inaccurate vs masked hypertension.  She is doing well on current medications.  We will try to switch clonidine to patches for easier use.  She states if not covered, she has gotten into good routine with the tablets and could stay on.  Will have her monitor BP readings from various MD visits (she sees [facility]) and if those readings are trending > 130 she will reach out to [place].      Tommy Medal PharmD CPP [facility] [address] [place], [place] [address] [phone]

## 2022-02-07 NOTE — Assessment & Plan Note (Signed)
Patient with resistant hypertension, currently doing well in office visits with only 4 of 17 readings > 161 systolic when in medical offices this year.  Home cuff was found to be 10 points higher last year, so unsure if cuff is inaccurate vs masked hypertension.  She is doing well on current medications.  We will try to switch clonidine to patches for easier use.  She states if not covered, she has gotten into good routine with the tablets and could stay on.  Will have her monitor BP readings from various MD visits (she sees [facility]) and if those readings are trending > 130 she will reach out to [place].

## 2022-02-08 MED ORDER — CLONIDINE 0.1 MG/24HR TD PTWK: 0.1000 mg | MEDICATED_PATCH | TRANSDERMAL | 12 refills | Status: DC

## 2022-02-09 NOTE — Progress Notes (Signed)
Chief Complaint:   OBESITY Madison Palmer is here to discuss her progress with her obesity treatment plan along with follow-up of her obesity related diagnoses. Madison Palmer is on keeping a food journal and adhering to recommended goals of 1800-2000 calories and 150+ grams of protein and states she is following her eating plan approximately 75% of the time. Madison Palmer states she is exercising 0 minutes 0 times per week.  Today's visit was #: 45 Starting weight: 357 lbs Starting date: 02/27/2018 Today's weight: 341 lbs Today's date: 02/07/2022 Total lbs lost to date: 16 lbs  Total lbs lost since last in-office visit: 0  Interim History: Madison Palmer is journaling everyday. Getting 1570-1750 calories in daily and averaging 100g of protein/day. Occasionally she can get up to 120g of protein/day. Some days she does not have an appetite and tries to get food in. Recently started Benztropine. Coffee in am with yogurt+PB breakfast from [employer]. Most days she does not eat lunch. Dinner is higher protein and can eat out or in . Eating out 3x's a week. No plans for Halloween.  Subjective:   1. Prediabetes Madison Palmer is on Metformin daily. Her last A1c 5.6. Denies GI side effects noted.  2. Essential hypertension Blood pressure at office today within normal limits. Blood pressure in evening is averaging 140s/high 80s. Denies chest pain, chest pressure and headache.  Assessment/Plan:   1. Prediabetes We will refill Metformin 500 mg daily for 1 month with 0 refills.  -Refill metFORMIN (GLUCOPHAGE) 500 MG tablet; Take 1 tablet (500 mg total) by mouth daily with breakfast.  Dispense: 90 tablet; Refill: 0  2. Essential hypertension Will follow up with Pharmacist for caris this afternoon.   3. Obesity with current BMI is 58.6 Madison Palmer is currently in the action stage of change. As such, her goal is to continue with weight loss efforts. She has agreed to keeping a food journal and adhering to recommended goals of 1800-2000  calories and 150+ grams of protein daily.   Recipes given out today.  Exercise goals: All adults should avoid inactivity. Some physical activity is better than none, and adults who participate in any amount of physical activity gain some health benefits.  Behavioral modification strategies: increasing lean protein intake, no skipping meals, meal planning and cooking strategies, and planning for success.  Madison Palmer has agreed to follow-up with our clinic in 6 weeks. She was informed of the importance of frequent follow-up visits to maximize her success with intensive lifestyle modifications for her multiple health conditions.   Objective:   Blood pressure 128/80, pulse 75, temperature 98 F (36.7 C), height '5\' 4"'$  (1.626 m), weight (!) 341 lb (154.7 kg), last menstrual period 05/02/2017, SpO2 96 %. Body mass index is 58.53 kg/m.  General: Cooperative, alert, well developed, in no acute distress. HEENT: Conjunctivae and lids unremarkable. Cardiovascular: Regular rhythm.  Lungs: Normal work of breathing. Neurologic: No focal deficits.   Lab Results  Component Value Date   CREATININE 15.0 (A) 09/08/2021   BUN 36 (H) 08/22/2021   NA 145 09/08/2021   K 4.2 09/08/2021   CL 104 09/08/2021   CO2 23 (A) 09/08/2021   Lab Results  Component Value Date   ALT 18 10/28/2021   AST 15 10/28/2021   ALKPHOS 70 10/28/2021   BILITOT 0.5 10/28/2021   Lab Results  Component Value Date   HGBA1C 5.6 04/12/2021   HGBA1C 5.6 06/01/2020   HGBA1C 5.7 (H) 12/11/2019   HGBA1C 5.7 (H) 12/24/2018  HGBA1C 6.0 (H) 02/27/2018   Lab Results  Component Value Date   INSULIN 18.9 06/01/2020   INSULIN 11.4 12/11/2019   INSULIN 21.1 12/30/2018   INSULIN 9.6 02/27/2018   Lab Results  Component Value Date   TSH 2.47 09/08/2021   Lab Results  Component Value Date   CHOL 173 10/28/2021   HDL 48 10/28/2021   LDLCALC 102 (H) 10/28/2021   TRIG 132 10/28/2021   CHOLHDL 3.6 10/28/2021   Lab Results   Component Value Date   VD25OH 34.4 09/08/2021   VD25OH 29.0 (L) 09/01/2020   VD25OH 19.7 (L) 06/01/2020   Lab Results  Component Value Date   WBC 5.5 11/08/2021   HGB 13.7 11/08/2021   HCT 41.2 11/08/2021   MCV 86.0 11/08/2021   PLT 262.0 11/08/2021   Lab Results  Component Value Date   IRON 44 02/20/2018   TIBC 347 05/29/2017   Attestation Statements:   Reviewed by clinician on day of visit: allergies, medications, problem list, medical history, surgical history, family history, social history, and previous encounter notes.  I, Elnora Morrison, RMA am acting as transcriptionist for Coralie Common, MD.  I have reviewed the above documentation for accuracy and completeness, and I agree with the above. - Coralie Common, MD

## 2022-03-14 ENCOUNTER — Telehealth (HOSPITAL_COMMUNITY): Payer: Self-pay | Admitting: *Deleted

## 2022-03-14 NOTE — Telephone Encounter (Signed)
Attempted to call patient regarding upcoming cardiac MRI appointment. Left message on voicemail with name and callback number  Sharonda Llamas RN Navigator Cardiac [facility] [phone] Office [phone] Cell  

## 2022-03-14 NOTE — Telephone Encounter (Signed)
Patient returning call regarding upcoming cardiac imaging study; pt verbalizes understanding of appt date/time, parking situation and where to check in, and verified current allergies; name and call back number provided for further questions should they arise  Madison Clement RN Navigator Cardiac [facility] [phone] office [phone] cell  Patient denies metal or claustrophobia.

## 2022-03-15 ENCOUNTER — Ambulatory Visit (HOSPITAL_COMMUNITY)
Admission: RE | Admit: 2022-03-15 | Discharge: 2022-03-15 | Disposition: A | Discharge status: Home / Self Care | Payer: BC Managed Care – PPO | Source: Ambulatory Visit | Attending: Cardiology | Admitting: Cardiology

## 2022-03-15 ENCOUNTER — Other Ambulatory Visit: Payer: Self-pay | Admitting: Cardiology

## 2022-03-15 DIAGNOSIS — I517 Cardiomegaly: Secondary | ICD-10-CM

## 2022-03-15 MED ORDER — GADOBUTROL 1 MMOL/ML IV SOLN
16.0000 mL | Freq: Once | INTRAVENOUS | Status: AC | PRN
  Administered 2022-03-15: 16 mL via INTRAVENOUS

## 2022-03-21 ENCOUNTER — Encounter (INDEPENDENT_AMBULATORY_CARE_PROVIDER_SITE_OTHER): Payer: Self-pay | Admitting: Family Medicine

## 2022-03-21 ENCOUNTER — Ambulatory Visit (INDEPENDENT_AMBULATORY_CARE_PROVIDER_SITE_OTHER): Payer: BC Managed Care – PPO | Admitting: Family Medicine

## 2022-03-21 VITALS — BP 145/85 | HR 81 | Temp 97.9°F | Ht 64.0 in | Wt 337.0 lb

## 2022-03-21 DIAGNOSIS — Z6841 Body Mass Index (BMI) 40.0 and over, adult: Secondary | ICD-10-CM | POA: Diagnosis not present

## 2022-03-21 DIAGNOSIS — E669 Obesity, unspecified: Secondary | ICD-10-CM | POA: Diagnosis not present

## 2022-03-21 DIAGNOSIS — I10 Essential (primary) hypertension: Secondary | ICD-10-CM | POA: Diagnosis not present

## 2022-03-22 ENCOUNTER — Other Ambulatory Visit: Payer: Self-pay | Admitting: Nurse Practitioner

## 2022-03-22 DIAGNOSIS — E782 Mixed hyperlipidemia: Secondary | ICD-10-CM

## 2022-03-22 NOTE — Telephone Encounter (Signed)
Chart supports Rx Last OV: 09/2021 Next OV: 03/2022

## 2022-03-27 ENCOUNTER — Other Ambulatory Visit: Payer: Self-pay | Admitting: Cardiology

## 2022-04-04 NOTE — Progress Notes (Signed)
Chief Complaint:   OBESITY Madison Palmer is here to discuss her progress with her obesity treatment plan along with follow-up of her obesity related diagnoses. Madison Palmer is on keeping a food journal and adhering to recommended goals of 1800-2000 calories and 150 grams of protein and states she is following her eating plan approximately 90% of the time. Madison Palmer states she is doing some extra walking.   Today's visit was #: 33 Starting weight: 357 lbs Starting date: 02/27/2018 Today's weight: 337 lbs Today's date: 03/21/2022 Total lbs lost to date: 20 lbs Total lbs lost since last in-office visit: 4  Interim History: Madison Palmer but did start getting in more protein in the form of a bar or shake consistently.  She has been trying to walk more. She and her son are cooking for Thanksgiving. She is planning on cooking over the weekend after Thanksgiving as well.  Subjective:   1. Essential hypertension Madison Palmer's blood pressure well controlled today. Better than previously. Denies chest pain, chest pressure and headache.  Assessment/Plan:   1. Essential hypertension Will follow up on blood pressure at next appointment.  2. Obesity with current BMI is 58.6 Madison Palmer is currently in the action stage of change. As such, her goal is to continue with weight loss efforts. She has agreed to keeping a food journal and adhering to recommended goals of 1800-2000 calories and 150 grams of protein daily.   Exercise goals: All adults should avoid inactivity. Some physical activity is better than none, and adults who participate in any amount of physical activity gain some health benefits.  Behavioral modification strategies: increasing lean protein intake, meal planning and cooking strategies, keeping healthy foods in the home, and planning for success.  Madison Palmer has agreed to follow-up with our clinic in 4 weeks. She was informed of the importance of frequent follow-up visits to maximize her success with intensive  lifestyle modifications for her multiple health conditions.   Objective:   Blood pressure (!) 145/85, pulse 81, temperature 97.9 F (36.6 C), height '5\' 4"'$  (1.626 m), weight (!) 337 lb (152.9 kg), last menstrual period 05/02/2017, SpO2 95 %. Body mass index is 57.85 kg/m.  General: Cooperative, alert, well developed, in no acute distress. HEENT: Conjunctivae and lids unremarkable. Cardiovascular: Regular rhythm.  Lungs: Normal work of breathing. Neurologic: No focal deficits.   Lab Results  Component Value Date   CREATININE 15.0 (A) 09/08/2021   BUN 36 (H) 08/22/2021   NA 145 09/08/2021   K 4.2 09/08/2021   CL 104 09/08/2021   CO2 23 (A) 09/08/2021   Lab Results  Component Value Date   ALT 18 10/28/2021   AST 15 10/28/2021   ALKPHOS 70 10/28/2021   BILITOT 0.5 10/28/2021   Lab Results  Component Value Date   HGBA1C 5.6 04/12/2021   HGBA1C 5.6 06/01/2020   HGBA1C 5.7 (H) 12/11/2019   HGBA1C 5.7 (H) 12/24/2018   HGBA1C 6.0 (H) 02/27/2018   Lab Results  Component Value Date   INSULIN 18.9 06/01/2020   INSULIN 11.4 12/11/2019   INSULIN 21.1 12/30/2018   INSULIN 9.6 02/27/2018   Lab Results  Component Value Date   TSH 2.47 09/08/2021   Lab Results  Component Value Date   CHOL 173 10/28/2021   HDL 48 10/28/2021   LDLCALC 102 (H) 10/28/2021   TRIG 132 10/28/2021   CHOLHDL 3.6 10/28/2021   Lab Results  Component Value Date   VD25OH 34.4 09/08/2021   VD25OH 29.0 (L) 09/01/2020  VD25OH 19.7 (L) 06/01/2020   Lab Results  Component Value Date   WBC 5.5 11/08/2021   HGB 13.7 11/08/2021   HCT 41.2 11/08/2021   MCV 86.0 11/08/2021   PLT 262.0 11/08/2021   Lab Results  Component Value Date   IRON 44 02/20/2018   TIBC 347 05/29/2017   Attestation Statements:   Reviewed by clinician on day of visit: allergies, medications, problem list, medical history, surgical history, family history, social history, and previous encounter notes.  I, Elnora Morrison, RMA  am acting as transcriptionist for Coralie Common, MD.  I have reviewed the above documentation for accuracy and completeness, and I agree with the above. - Coralie Common, MD

## 2022-04-08 ENCOUNTER — Other Ambulatory Visit (HOSPITAL_COMMUNITY): Payer: Self-pay | Admitting: Psychiatry

## 2022-04-08 DIAGNOSIS — R251 Tremor, unspecified: Secondary | ICD-10-CM

## 2022-04-08 DIAGNOSIS — F319 Bipolar disorder, unspecified: Secondary | ICD-10-CM

## 2022-04-12 ENCOUNTER — Encounter (HOSPITAL_COMMUNITY): Payer: Self-pay | Admitting: Psychiatry

## 2022-04-12 ENCOUNTER — Telehealth (HOSPITAL_BASED_OUTPATIENT_CLINIC_OR_DEPARTMENT_OTHER): Payer: BC Managed Care – PPO | Admitting: Psychiatry

## 2022-04-12 DIAGNOSIS — R251 Tremor, unspecified: Secondary | ICD-10-CM | POA: Diagnosis not present

## 2022-04-12 DIAGNOSIS — F319 Bipolar disorder, unspecified: Secondary | ICD-10-CM

## 2022-04-12 DIAGNOSIS — F419 Anxiety disorder, unspecified: Secondary | ICD-10-CM | POA: Diagnosis not present

## 2022-04-12 MED ORDER — LURASIDONE HCL 40 MG PO TABS: 40.0000 mg | ORAL_TABLET | Freq: Every day | ORAL | 2 refills | Status: DC

## 2022-04-12 MED ORDER — BENZTROPINE MESYLATE 0.5 MG PO TABS: 0.5000 mg | ORAL_TABLET | Freq: Every day | ORAL | 2 refills | Status: DC

## 2022-04-12 NOTE — Progress Notes (Signed)
Virtual Visit via Telephone Note  I connected with Madison Palmer on 04/12/22 at 10:00 AM EST by telephone and verified that I am speaking with the correct person using two identifiers.  Location: Patient: Home Provider: Home Office   I discussed the limitations, risks, security and privacy concerns of performing an evaluation and management service by telephone and the availability of in person appointments. I also discussed with the patient that there may be a patient responsible charge related to this service. The patient expressed understanding and agreed to proceed.   History of Present Illness: Patient is evaluated by phone session.  She is taking all her medication as prescribed.  We added Cogentin to help her tremors and she reported it is working well and she has no more tremors.  However she occasionally had dry mouth.  She is happy that son almost finished his school and going to apply to take college classes.  Patient also thinking to go back to work and she had applied for jobs and waiting for the response.  Her sleep is good.  She uses CPAP that helps.  She is pleased that her blood pressure is also under control.  She denies any mania, psychosis, hallucination.  She denies any crying spells or any suicidal thoughts.  She is with a weight loss program and she lost few pounds since the last visit.  She is watching her calorie intake and healthy diet.  Patient like to keep the Latuda and Cogentin.  Patient told her family is going to [place] to visit family members and Christmas but she cannot arrange anybody to keep the dog so she will be staying home by herself.  She disappointed but feels comfortable with her decision.  Past psychiatric history; H/O depression and inpatient at [facility] and [facility] in May 20202 for delusion, psychosis and violent behavior. D/C on Depakote 500 mg BID, Latuda 60 mg daily and Trilafon 12 mg a day. No h/o suicidal attempt, nightmares, panic  attack, OCD or substance use.     Psychiatric Specialty Exam: Physical Exam  Review of Systems  Constitutional:        Dry mouth    Weight (!) 337 lb (152.9 kg), last menstrual period 05/02/2017.There is no height or weight on file to calculate BMI.  General Appearance: NA  Eye Contact:  NA  Speech:  Normal Rate  Volume:  Normal  Mood:  Euthymic  Affect:  NA  Thought Process:  Goal Directed  Orientation:  Full (Time, Place, and Person)  Thought Content:  WDL  Suicidal Thoughts:  No  Homicidal Thoughts:  No  Memory:  Immediate;   Good Recent;   Good Remote;   Fair  Judgement:  Intact  Insight:  Present  Psychomotor Activity:  NA  Concentration:  Concentration: Good and Attention Span: Good  Recall:  Good  Fund of Knowledge:  Good  Language:  Good  Akathisia:  No  Handed:  Right  AIMS (if indicated):     Assets:  Communication Skills Desire for Improvement Housing Social Support Transportation  ADL's:  Intact  Cognition:  WNL  Sleep:   good with CPAP      Assessment and Plan: Bipolar disorder type I.  Anxiety.  Tremors.  Patient doing better since we added the Cogentin to help the tremors.  She is also sleeping better.  We discussed dry mouth with the Cogentin and if it did not improve in the next few weeks then we may  consider switching to Artane.  However patient comfortable with the Cogentin since it is helping the tremors.  Continue Latuda 40 mg daily.  Encouraged to continue appointment with weight loss program.  Recommended to call us back if she has any question or any concern.  Follow-up in 3 months.  Follow Up Instructions:    I discussed the assessment and treatment plan with the patient. The patient was provided an opportunity to ask questions and all were answered. The patient agreed with the plan and demonstrated an understanding of the instructions.   The patient was advised to call back or seek an in-person evaluation if the symptoms worsen or if  the condition fails to improve as anticipated.  Collaboration of Care: Other provider involved in patient's care AEB notes are available in epic to review.  Patient/Guardian was advised Release of Information must be obtained prior to any record release in order to collaborate their care with an outside provider. Patient/Guardian was advised if they have not already done so to contact the registration department to sign all necessary forms in order for [place] to release information regarding their care.   Consent: Patient/Guardian gives verbal consent for treatment and assignment of benefits for services provided during this visit. Patient/Guardian expressed understanding and agreed to proceed.    I provided 14 minutes of non-face-to-face time during this encounter.   Kathlee Nations, MD

## 2022-04-15 ENCOUNTER — Other Ambulatory Visit: Payer: Self-pay | Admitting: Cardiology

## 2022-04-18 ENCOUNTER — Other Ambulatory Visit (HOSPITAL_COMMUNITY): Payer: Self-pay | Admitting: Psychiatry

## 2022-04-18 ENCOUNTER — Ambulatory Visit (INDEPENDENT_AMBULATORY_CARE_PROVIDER_SITE_OTHER): Payer: BC Managed Care – PPO | Admitting: Nurse Practitioner

## 2022-04-18 ENCOUNTER — Encounter: Payer: Self-pay | Admitting: Nurse Practitioner

## 2022-04-18 VITALS — BP 138/82 | HR 76 | Temp 97.1°F | Ht 64.0 in | Wt 341.0 lb

## 2022-04-18 DIAGNOSIS — L819 Disorder of pigmentation, unspecified: Secondary | ICD-10-CM

## 2022-04-18 DIAGNOSIS — L918 Other hypertrophic disorders of the skin: Secondary | ICD-10-CM | POA: Diagnosis not present

## 2022-04-18 DIAGNOSIS — R251 Tremor, unspecified: Secondary | ICD-10-CM

## 2022-04-18 DIAGNOSIS — E210 Primary hyperparathyroidism: Secondary | ICD-10-CM

## 2022-04-18 DIAGNOSIS — E038 Other specified hypothyroidism: Secondary | ICD-10-CM

## 2022-04-18 DIAGNOSIS — E1169 Type 2 diabetes mellitus with other specified complication: Secondary | ICD-10-CM

## 2022-04-18 DIAGNOSIS — E559 Vitamin D deficiency, unspecified: Secondary | ICD-10-CM | POA: Diagnosis not present

## 2022-04-18 DIAGNOSIS — I10 Essential (primary) hypertension: Secondary | ICD-10-CM

## 2022-04-18 DIAGNOSIS — E785 Hyperlipidemia, unspecified: Secondary | ICD-10-CM

## 2022-04-18 DIAGNOSIS — F319 Bipolar disorder, unspecified: Secondary | ICD-10-CM

## 2022-04-18 DIAGNOSIS — Z23 Encounter for immunization: Secondary | ICD-10-CM

## 2022-04-18 LAB — LDL CHOLESTEROL, DIRECT: Direct LDL: 94 mg/dL

## 2022-04-18 LAB — HEMOGLOBIN A1C: Hgb A1c MFr Bld: 6.3 % (ref 4.6–6.5)

## 2022-04-18 NOTE — Assessment & Plan Note (Signed)
With multinodular goiter. Followed by dr. Canos-Endocrinology Labs completed 03/22/2022: TSH 2.260, T4 1.64, T3 2.8 Current use of levothyroxine 57mg

## 2022-04-18 NOTE — Assessment & Plan Note (Signed)
Repeat vit. D per endocrinology on 03/22/2022: 41.0

## 2022-04-18 NOTE — Patient Instructions (Addendum)
Go to lab Change bandaid daily, clean with warm water and dial soap Apply triple antiobiotic ointment with dressing change daily x 3days then stop. Apply only dressing dressing if needed. Avoid any deodorant. Call office if any signs of cellulitis: redness, increasing pain, drainage, odor, fever, swollen lymph node. Sign medical release to get eye exam report.

## 2022-04-18 NOTE — Assessment & Plan Note (Signed)
Repeat hgbA1c today No adverse effects with metformin Normal urine microalbumin Eye exam report requested Maintain med dose and f/up in 76month

## 2022-04-18 NOTE — Assessment & Plan Note (Signed)
BP at goal with amlodipine, lisinopril, and furosemide BP Readings from Last 3 Encounters:  04/18/22 138/82  03/21/22 (!) 145/85  02/07/22 129/74  Also under the care of Cardiology: Dr. Ladoris Gene and [facility] Labs completed 03/22/2022: Glucose 108, BUN 22, Creat 1.06, Gfr 62, Na 141, K 4.2, Cl 103, Ca 10.2, Phos 3.7, Albumin 4.2, Maintain med doses F/up in 3-61month

## 2022-04-18 NOTE — Progress Notes (Unsigned)
Established Patient Visit  Patient: Madison Palmer   DOB: 12-Mar-1966   56 y.o. Female  MRN: [id number] Visit Date: 04/19/2022  Subjective:    Chief Complaint  Patient presents with   Follow-up    6 month follow up on DM and BP, concerns about dry mouth x 3 weeks. Possible skin tag at left arm pit little tender to touch.    HPI DM (diabetes mellitus) (Madison Palmer) Repeat hgbA1c today No adverse effects with metformin Normal urine microalbumin Eye exam report requested Maintain med dose and f/up in 9month  Vitamin D deficiency Repeat vit. D per endocrinology on 03/22/2022: 41.0  Hyperparathyroidism, primary ([name] Followed by endocrinology-Dr. CGarrel RidgelLabs completed on 03/22/2022: PTH 22, calcium 10.2  Hyperlipidemia associated with type 2 diabetes mellitus ([name] Current use of fenofibrate Only able to tolerate low dose atorvastatin Unable to tolerate crestor Repeat LDL today: not at goal Maintain current med doses  Hypothyroidism With multinodular goiter. Followed by dr. Canos-Endocrinology Labs completed 03/22/2022: TSH 2.260, T4 1.64, T3 2.8 Current use of levothyroxine 830m  Essential hypertension BP at goal with amlodipine, lisinopril, and furosemide BP Readings from Last 3 Encounters:  04/18/22 138/82  03/21/22 (!) 145/85  02/07/22 129/74  Also under the care of Cardiology: [facility]/22/2022: Glucose 108, BUN 22, Creat 1.06, Gfr 62, Na 141, K 4.2, Cl 103, Ca 10.2, Phos 3.7, Albumin 4.2, Maintain med doses F/up in 3-75m22monthWt Readings from Last 3 Encounters:  04/18/22 (!) 341 lb (154.7 kg)  03/21/22 (!) 337 lb (152.9 kg)  02/07/22 (!) 341 lb (154.7 kg)    Reviewed medical, surgical, and social history today  Medications: Outpatient Medications Prior to Visit  Medication Sig   acetaminophen (TYLENOL) 500 MG tablet Take 1,000 mg by mouth 3 (three) times daily as needed for moderate pain or headache.   amLODipine  (NORVASC) 10 MG tablet TAKE 1 TABLET BY MOUTH EVERY DAY   atorvastatin (LIPITOR) 20 MG tablet TAKE 1 TABLET(20 MG) BY MOUTH 3 TIMES A WEEK   benztropine (COGENTIN) 0.5 MG tablet Take 1 tablet (0.5 mg total) by mouth at bedtime.   cetirizine (ZYRTEC) 10 MG tablet TAKE 1 TABLET BY MOUTH EVERY DAY   Cholecalciferol (VITAMIN D3 MAXIMUM STRENGTH) 125 MCG (5000 UT) capsule Take 5,000 Units by mouth daily.   cinacalcet (SENSIPAR) 30 MG tablet Take 30 mg by mouth daily.   esomeprazole (NEXIUM) 40 MG capsule TAKE 1 CAPSULE BY MOUTH TWICE DAILY   fenofibrate (TRICOR) 145 MG tablet TAKE 1 TABLET(145 MG) BY MOUTH DAILY   furosemide (LASIX) 40 MG tablet TAKE 1 TABLET(40 MG) BY MOUTH DAILY   gabapentin (NEURONTIN) 100 MG capsule TAKE 2 CAPSULES(200 MG) BY MOUTH AT BEDTIME   labetalol (NORMODYNE) 300 MG tablet TAKE 1.5 TABLETS BY MOUTH TWICE DAILY   levothyroxine (SYNTHROID) 88 MCG tablet Take 88 mcg by mouth daily.   lisinopril (ZESTRIL) 40 MG tablet TAKE 1 TABLET(40 MG) BY MOUTH DAILY   lurasidone (LATUDA) 40 MG TABS tablet Take 1 tablet (40 mg total) by mouth daily with breakfast.   metFORMIN (GLUCOPHAGE) 500 MG tablet Take 1 tablet (500 mg total) by mouth daily with breakfast.   Polyethylene Glycol 3350 (MIRALAX PO) Take by mouth.   Potassium Chloride ER 20 MEQ TBCR TAKE 1 TABLET BY MOUTH EVERY DAY   TART CHERRY PO Take by mouth daily.   [DISCONTINUED] cloNIDine (  CATAPRES - DOSED IN MG/24 HR) 0.1 mg/24hr patch Place 1 patch (0.1 mg total) onto the skin once a week. (Patient not taking: Reported on 03/21/2022)   No facility-administered medications prior to visit.   Reviewed past medical and social history.   ROS per HPI above      Objective:  BP 138/82 (BP Location: Left Arm, Patient Position: Sitting, Cuff Size: Large)   Pulse 76   Temp (!) 97.1 F (36.2 C) (Temporal)   Ht '5\' 4"'$  (1.626 m)   Wt (!) 341 lb (154.7 kg)   LMP 05/02/2017   SpO2 95%   BMI 58.53 kg/m      Physical  Exam Cardiovascular:     Rate and Rhythm: Normal rate.     Pulses: Normal pulses.     Heart sounds: Normal heart sounds.  Pulmonary:     Effort: Pulmonary effort is normal.  Musculoskeletal:     Right lower leg: No edema.     Left lower leg: No edema.  Lymphadenopathy:     Upper Body:     Left upper body: No supraclavicular or axillary adenopathy.  Skin:    Findings: Lesion present. No erythema.     Comments: Rough, Hyperpigmented lesion on left axilla fold.  Neurological:     Mental Status: She is alert and oriented to person, place, and time.  Psychiatric:        Mood and Affect: Mood normal.        Behavior: Behavior normal.        Thought Content: Thought content normal.      Shave biopsy of Left axilla skin Tag: Procedure including risks/benefits explained to patient.   Questions were answered.  After written consent was obtained and a time out completed. Site was cleansed with betadine and then alcohol. 1% Lidocaine with epinephrine was injected under lesion and then shave biopsy was performed. Applied manual pressure to obtain hemostasis.  Pt tolerated procedure well.  Specimen sent for pathology review.  Pt instructed to keep the area dry for 24 hours and to contact us if he develops redness, drainage or swelling at the site.  Pt may use tylenol as needed for discomfort today.    Results for orders placed or performed in visit on 04/18/22  Hemoglobin A1c  Result Value Ref Range   Hgb A1c MFr Bld 6.3 4.6 - 6.5 %  Direct LDL  Result Value Ref Range   Direct LDL 94.0 mg/dL      Assessment & Plan:    Problem List Items Addressed This Visit       Cardiovascular and Mediastinum   Essential hypertension - Primary    BP at goal with amlodipine, lisinopril, and furosemide BP Readings from Last 3 Encounters:  04/18/22 138/82  03/21/22 (!) 145/85  02/07/22 129/74  Also under the care of Cardiology: Madison Palmer and [name] completed 03/22/2022: Glucose 108, BUN  22, Creat 1.06, Gfr 62, Na 141, K 4.2, Cl 103, Ca 10.2, Phos 3.7, Albumin 4.2, Maintain med doses F/up in 3-29month        Endocrine   DM (diabetes mellitus) ([name]    Repeat hgbA1c today No adverse effects with metformin Normal urine microalbumin Eye exam report requested Maintain med dose and f/up in 634month     Relevant Orders   Hemoglobin A1c (Completed)   Hyperlipidemia associated with type 2 diabetes mellitus ([place]   Current use of fenofibrate Only able to tolerate low dose atorvastatin Unable  to tolerate crestor Repeat LDL today: not at goal Maintain current med doses      Relevant Orders   Direct LDL (Completed)   Hyperparathyroidism, primary ([place])    Followed by endocrinology-Dr. Garrel Palmer Labs completed on 03/22/2022: PTH 22, calcium 10.2      Hypothyroidism    With multinodular goiter. Followed by dr. Canos-Endocrinology Labs completed 03/22/2022: TSH 2.260, T4 1.64, T3 2.8 Current use of levothyroxine 55mg        Other   Vitamin D deficiency    Repeat vit. D per endocrinology on 03/22/2022: 41.0      Other Visit Diagnoses     Atypical pigmented skin lesion       Relevant Orders   Pathology   Skin tag       Need for influenza vaccination       Relevant Orders   Flu Vaccine QUAD High Dose(Fluad) (Completed)      Return in about 6 months (around 10/18/2022) for HTN, DM, hyperlipidemia (fasting).     CWilfred Lacy NP

## 2022-04-18 NOTE — Assessment & Plan Note (Signed)
LDL at goal fenofibrate Unable to tolerate statins (atorvastatin and crestor) Repeat LDL today

## 2022-04-18 NOTE — Assessment & Plan Note (Signed)
Followed by endocrinology-Dr. Garrel Ridgel Labs completed on 03/22/2022: PTH 22, calcium 10.2

## 2022-04-19 ENCOUNTER — Encounter (INDEPENDENT_AMBULATORY_CARE_PROVIDER_SITE_OTHER): Payer: Self-pay | Admitting: Family Medicine

## 2022-04-19 ENCOUNTER — Ambulatory Visit (INDEPENDENT_AMBULATORY_CARE_PROVIDER_SITE_OTHER): Payer: BC Managed Care – PPO | Admitting: Family Medicine

## 2022-04-19 ENCOUNTER — Other Ambulatory Visit (HOSPITAL_COMMUNITY): Payer: Self-pay | Admitting: *Deleted

## 2022-04-19 VITALS — BP 126/72 | HR 86 | Temp 98.3°F | Ht 64.0 in | Wt 335.0 lb

## 2022-04-19 DIAGNOSIS — R7303 Prediabetes: Secondary | ICD-10-CM | POA: Diagnosis not present

## 2022-04-19 DIAGNOSIS — E669 Obesity, unspecified: Secondary | ICD-10-CM

## 2022-04-19 DIAGNOSIS — F319 Bipolar disorder, unspecified: Secondary | ICD-10-CM

## 2022-04-19 DIAGNOSIS — E213 Hyperparathyroidism, unspecified: Secondary | ICD-10-CM | POA: Diagnosis not present

## 2022-04-19 DIAGNOSIS — Z6841 Body Mass Index (BMI) 40.0 and over, adult: Secondary | ICD-10-CM

## 2022-04-19 DIAGNOSIS — R251 Tremor, unspecified: Secondary | ICD-10-CM

## 2022-04-19 MED ORDER — BENZTROPINE MESYLATE 0.5 MG PO TABS: 0.5000 mg | ORAL_TABLET | Freq: Every day | ORAL | 2 refills | Status: DC

## 2022-04-19 MED ORDER — METFORMIN HCL 500 MG PO TABS: 500.0000 mg | ORAL_TABLET | Freq: Two times a day (BID) | ORAL | 0 refills | Status: DC

## 2022-04-20 LAB — PATHOLOGY REPORT

## 2022-04-27 ENCOUNTER — Ambulatory Visit: Payer: BC Managed Care – PPO | Admitting: Nurse Practitioner

## 2022-04-28 ENCOUNTER — Other Ambulatory Visit: Payer: Self-pay | Admitting: Gastroenterology

## 2022-04-28 ENCOUNTER — Other Ambulatory Visit: Payer: Self-pay | Admitting: Cardiology

## 2022-05-06 ENCOUNTER — Other Ambulatory Visit (INDEPENDENT_AMBULATORY_CARE_PROVIDER_SITE_OTHER): Payer: Self-pay | Admitting: Family Medicine

## 2022-05-06 DIAGNOSIS — R7303 Prediabetes: Secondary | ICD-10-CM

## 2022-05-08 NOTE — Progress Notes (Signed)
Chief Complaint:   OBESITY Madison Palmer is here to discuss her progress with her obesity treatment plan along with follow-up of her obesity related diagnoses. Levern is on keeping a food journal and adhering to recommended goals of 1800-2000 calories and 150 grams of protein and states she is following her eating plan approximately 90% of the time. Tannis states she is exercising 0 minutes 0 times per week.  Today's visit was #: 2 Starting weight: 357 lbs Starting date: 02/27/2018 Today's weight: 335 lbs Today's date: 04/19/2022 Total lbs lost to date: 22 lbs Total lbs lost since last in-office visit: 2  Interim History: Madison Palmer has done a bit of traveling since last appointment.  Saw PCP in endocrinology.  Calories and protein has not been as consistent due to the events recently.  Realizes she cannot just eat 2 meals per day.  Not sure what upcoming plans will be for Christmas and New Year's.  Subjective:   1. Prediabetes A1c at 6.3 increased from 5.6.  Madison Palmer is taking metformin daily.  2. Hyperparathyroidism ([place]) Madison Palmer sees Dr. Garrel Ridgel at [facility].  Assessment/Plan:   1. Prediabetes We will increase/refill Metformin 500 mg by mouth twice a day for 1 month with 0 refills.  -Increase/Refill metFORMIN (GLUCOPHAGE) 500 MG tablet; Take 1 tablet (500 mg total) by mouth 2 (two) times daily with a meal.  Dispense: 180 tablet; Refill: 0  2. Hyperparathyroidism ([place]) Will follow-up with Dr. Garrel Ridgel in 3 to 4 months.  3. Obesity with current BMI is 57.6 Madison Palmer is currently in the action stage of change. As such, her goal is to continue with weight loss efforts. She has agreed to keeping a food journal and adhering to recommended goals of 1800-2000 calories and 150+ grams of protein daily.   Exercise goals: All adults should avoid inactivity. Some physical activity is better than none, and adults who participate in any amount of physical activity gain some health benefits.  Behavioral  modification strategies: increasing lean protein intake, meal planning and cooking strategies, keeping healthy foods in the home, planning for success, and keeping a strict food journal.  Madison Palmer has agreed to follow-up with our clinic in 4 weeks. She was informed of the importance of frequent follow-up visits to maximize her success with intensive lifestyle modifications for her multiple health conditions.   Objective:   Blood pressure 126/72, pulse 86, temperature 98.3 F (36.8 C), height '5\' 4"'$  (1.626 m), weight (!) 335 lb (152 kg), last menstrual period 05/02/2017, SpO2 95 %. Body mass index is 57.5 kg/m.  General: Cooperative, alert, well developed, in no acute distress. HEENT: Conjunctivae and lids unremarkable. Cardiovascular: Regular rhythm.  Lungs: Normal work of breathing. Neurologic: No focal deficits.   Lab Results  Component Value Date   CREATININE 15.0 (A) 09/08/2021   BUN 36 (H) 08/22/2021   NA 145 09/08/2021   K 4.2 09/08/2021   CL 104 09/08/2021   CO2 23 (A) 09/08/2021   Lab Results  Component Value Date   ALT 18 10/28/2021   AST 15 10/28/2021   ALKPHOS 70 10/28/2021   BILITOT 0.5 10/28/2021   Lab Results  Component Value Date   HGBA1C 6.3 04/18/2022   HGBA1C 5.6 04/12/2021   HGBA1C 5.6 06/01/2020   HGBA1C 5.7 (H) 12/11/2019   HGBA1C 5.7 (H) 12/24/2018   Lab Results  Component Value Date   INSULIN 18.9 06/01/2020   INSULIN 11.4 12/11/2019   INSULIN 21.1 12/30/2018   INSULIN 9.6 02/27/2018  Lab Results  Component Value Date   TSH 2.47 09/08/2021   Lab Results  Component Value Date   CHOL 173 10/28/2021   HDL 48 10/28/2021   LDLCALC 102 (H) 10/28/2021   LDLDIRECT 94.0 04/18/2022   TRIG 132 10/28/2021   CHOLHDL 3.6 10/28/2021   Lab Results  Component Value Date   VD25OH 34.4 09/08/2021   VD25OH 29.0 (L) 09/01/2020   VD25OH 19.7 (L) 06/01/2020   Lab Results  Component Value Date   WBC 5.5 11/08/2021   HGB 13.7 11/08/2021   HCT 41.2  11/08/2021   MCV 86.0 11/08/2021   PLT 262.0 11/08/2021   Lab Results  Component Value Date   IRON 44 02/20/2018   TIBC 347 05/29/2017   Attestation Statements:   Reviewed by clinician on day of visit: allergies, medications, problem list, medical history, surgical history, family history, social history, and previous encounter notes.  I, Madison Palmer, RMA am acting as transcriptionist for Madison Common, MD.  I have reviewed the above documentation for accuracy and completeness, and I agree with the above. - Madison Common, MD

## 2022-05-17 ENCOUNTER — Ambulatory Visit (INDEPENDENT_AMBULATORY_CARE_PROVIDER_SITE_OTHER): Payer: BC Managed Care – PPO | Admitting: Family Medicine

## 2022-05-17 ENCOUNTER — Encounter (INDEPENDENT_AMBULATORY_CARE_PROVIDER_SITE_OTHER): Payer: Self-pay | Admitting: Family Medicine

## 2022-05-17 VITALS — BP 133/85 | HR 90 | Temp 98.1°F | Ht 64.0 in | Wt 330.0 lb

## 2022-05-17 DIAGNOSIS — Z6841 Body Mass Index (BMI) 40.0 and over, adult: Secondary | ICD-10-CM

## 2022-05-17 DIAGNOSIS — E038 Other specified hypothyroidism: Secondary | ICD-10-CM

## 2022-05-17 DIAGNOSIS — E669 Obesity, unspecified: Secondary | ICD-10-CM

## 2022-05-26 ENCOUNTER — Encounter: Payer: Self-pay | Admitting: Gastroenterology

## 2022-05-30 NOTE — Progress Notes (Signed)
Chief Complaint:   OBESITY Madison Palmer is here to discuss her progress with her obesity treatment plan along with follow-up of her obesity related diagnoses. Madison Palmer is on keeping a food journal and adhering to recommended goals of 1800-2000 calories and 150 grams of protein and states she is following her eating plan approximately 100% of the time. Madison Palmer states she is exercising 0 minutes 0 times per week.  Today's visit was #: 6 Starting weight: 357 lbs Starting date: 02/27/2018 Today's weight: 330 lbs Today's date: 05/17/2022 Total lbs lost to date: 27 lbs Total lbs lost since last in-office visit: 5  Interim History: Madison Palmer has tried to stay focused on journaling and achieving calories and protein intake daily.  Has been drinking more tea recently due to coffee leaving her mouth exceptionally dry.  She is not eating much lunch but has eaten some Lean cuisine to get calories and protein in.  Ending up between 1500 to 1600 cal and 97-106 g of protein daily.  Subjective:   1. Other specified hypothyroidism Last TSH of 2.2 in November with Dr. Garrel Ridgel.  Assessment/Plan:   1. Other specified hypothyroidism Continue Levothyroxine at current dose.  Will recheck TSH in 6 months.  2. Obesity with current BMI is 56.7 Madison Palmer is currently in the action stage of change. As such, her goal is to continue with weight loss efforts. She has agreed to keeping a food journal and adhering to recommended goals of 1800-2000 calories and 150 grams of protein daily.   Exercise goals: All adults should avoid inactivity. Some physical activity is better than none, and adults who participate in any amount of physical activity gain some health benefits.  Madison Palmer is to return to pool with son and start some chair exercises.  Behavioral modification strategies: increasing lean protein intake, meal planning and cooking strategies, keeping healthy foods in the home, and planning for success.  Madison Palmer has agreed  to follow-up with our clinic in 4 weeks. She was informed of the importance of frequent follow-up visits to maximize her success with intensive lifestyle modifications for her multiple health conditions.   Objective:   Blood pressure 133/85, pulse 90, temperature 98.1 F (36.7 C), height '5\' 4"'$  (1.626 m), weight (!) 330 lb (149.7 kg), last menstrual period 05/02/2017, SpO2 94 %. Body mass index is 56.64 kg/m.  General: Cooperative, alert, well developed, in no acute distress. HEENT: Conjunctivae and lids unremarkable. Cardiovascular: Regular rhythm.  Lungs: Normal work of breathing. Neurologic: No focal deficits.   Lab Results  Component Value Date   CREATININE 15.0 (A) 09/08/2021   BUN 36 (H) 08/22/2021   NA 145 09/08/2021   K 4.2 09/08/2021   CL 104 09/08/2021   CO2 23 (A) 09/08/2021   Lab Results  Component Value Date   ALT 18 10/28/2021   AST 15 10/28/2021   ALKPHOS 70 10/28/2021   BILITOT 0.5 10/28/2021   Lab Results  Component Value Date   HGBA1C 6.3 04/18/2022   HGBA1C 5.6 04/12/2021   HGBA1C 5.6 06/01/2020   HGBA1C 5.7 (H) 12/11/2019   HGBA1C 5.7 (H) 12/24/2018   Lab Results  Component Value Date   INSULIN 18.9 06/01/2020   INSULIN 11.4 12/11/2019   INSULIN 21.1 12/30/2018   INSULIN 9.6 02/27/2018   Lab Results  Component Value Date   TSH 2.47 09/08/2021   Lab Results  Component Value Date   CHOL 173 10/28/2021   HDL 48 10/28/2021   LDLCALC 102 (H) 10/28/2021  LDLDIRECT 94.0 04/18/2022   TRIG 132 10/28/2021   CHOLHDL 3.6 10/28/2021   Lab Results  Component Value Date   VD25OH 34.4 09/08/2021   VD25OH 29.0 (L) 09/01/2020   VD25OH 19.7 (L) 06/01/2020   Lab Results  Component Value Date   WBC 5.5 11/08/2021   HGB 13.7 11/08/2021   HCT 41.2 11/08/2021   MCV 86.0 11/08/2021   PLT 262.0 11/08/2021   Lab Results  Component Value Date   IRON 44 02/20/2018   TIBC 347 05/29/2017   Attestation Statements:   Reviewed by clinician on day of  visit: allergies, medications, problem list, medical history, surgical history, family history, social history, and previous encounter notes.  I, Elnora Morrison, RMA am acting as transcriptionist for Coralie Common, MD. I have reviewed the above documentation for accuracy and completeness, and I agree with the above. - Coralie Common, MD

## 2022-06-04 ENCOUNTER — Encounter: Payer: Self-pay | Admitting: Cardiology

## 2022-06-04 NOTE — Progress Notes (Unsigned)
Cardiology Office Note   Date:  06/05/2022   ID:  Madison Palmer, DOB Jan 01, 1966, MRN [id number]  PCP:  Flossie Buffy, NP  Cardiologist:   Minus Breeding, MD   Chief Complaint  Patient presents with   Edema       History of Present Illness: Madison Palmer is a 57 y.o. female who presents for follow up of dyspnea.   She had an echo in Nov with a normal EF of 60 - 65% with severe concentric LVH.   She also is noted to have a past history of difficult to control hypertension.  She moved here from [place].  She has had LVH with preserved ejection fraction.  She had some mild internal carotid artery plaque noted on Doppler in the past.  She had a perfusion study in 2018 that demonstrated no evidence of ischemia or infarct.  She is been managed for her hypertension.     She has hypercalcemia.  This is managed at [facility].  Since I last saw her she had MRI which demonstrates severe LVH but no amlyoid.  She has followed with weight loss and is managed to slowly drift down her weight.  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  She has been more active than this.  Her feet up and down more so she has a little more swelling.  The patient denies any new symptoms such as chest discomfort, neck or arm discomfort. There has been no new shortness of breath, PND or orthopnea. There have been no reported palpitations, presyncope or syncope.     Past Medical History:  Diagnosis Date   Acute on chronic respiratory failure with hypoxia and hypercapnia (HCC) 09/15/2018   Anemia    Anxiety    Arrhythmia    tachycardia   Arthritis    Bipolar 1 disorder ([place]) 09/12/2018   Bipolar I disorder, current or most recent episode manic, with psychotic features ([place])    Brief psychotic disorder ([place]) 08/22/2018   Chronic respiratory failure with hypoxia ([place]) 05/10/2018   Formatting of this note might be different from the original. Last Assessment &  Plan:  Continue 1 L continuous on exertion and during sleep. On her next visit we will check OSA on CPAP/room air to decide whether she needs to take oxygen along with her on her cruise in May   Depression    Diverticulitis    Generalized edema 03/12/2018   GERD (gastroesophageal reflux disease)    Glaucoma    Hyperlipidemia    Hyperparathyroidism ([place])    Hypertension    Inflammatory polyps of colon ([place])    Leg swelling 01/14/2019   LVH (left ventricular hypertrophy)    Lymphedema    Morbid (severe) obesity due to excess calories ([place]) 05/11/2017   PCOS (polycystic ovarian syndrome)    Prediabetes    Recurrent UTI    Sleep apnea    CPAP   Thyroid disease    Vitamin D deficiency     Past Surgical History:  Procedure Laterality Date   BREAST BIOPSY  2015   CESAREAN SECTION  2004   INNER EAR SURGERY     ear and sinus surgery   LAPAROSCOPIC REPAIR AND REMOVAL OF GASTRIC BAND     OOPHORECTOMY Left    TONSILLECTOMY AND ADENOIDECTOMY       Current Outpatient Medications  Medication Sig Dispense Refill   acetaminophen (TYLENOL) 500  MG tablet Take 1,000 mg by mouth 3 (three) times daily as needed for moderate pain or headache.     amLODipine (NORVASC) 10 MG tablet TAKE 1 TABLET BY MOUTH EVERY DAY 90 tablet 3   atorvastatin (LIPITOR) 20 MG tablet TAKE 1 TABLET(20 MG) BY MOUTH 3 TIMES A WEEK 12 tablet 5   benztropine (COGENTIN) 0.5 MG tablet Take 1 tablet (0.5 mg total) by mouth at bedtime. 30 tablet 2   cetirizine (ZYRTEC) 10 MG tablet TAKE 1 TABLET BY MOUTH EVERY DAY 90 tablet 1   Cholecalciferol (VITAMIN D3 MAXIMUM STRENGTH) 125 MCG (5000 UT) capsule Take 5,000 Units by mouth daily.     cinacalcet (SENSIPAR) 30 MG tablet Take 30 mg by mouth daily.     cloNIDine (CATAPRES) 0.1 MG tablet Take 0.1 mg by mouth 3 (three) times daily.     esomeprazole (NEXIUM) 40 MG capsule TAKE 1 CAPSULE BY MOUTH TWICE DAILY 180 capsule 2   fenofibrate (TRICOR) 145 MG tablet TAKE 1 TABLET(145 MG)  BY MOUTH DAILY 90 tablet 3   furosemide (LASIX) 40 MG tablet TAKE 1 TABLET BY MOUTH EVERY DAY 90 tablet 3   gabapentin (NEURONTIN) 100 MG capsule TAKE 2 CAPSULES(200 MG) BY MOUTH AT BEDTIME 180 capsule 3   labetalol (NORMODYNE) 300 MG tablet TAKE 1.5 TABLETS BY MOUTH TWICE DAILY 270 tablet 3   levothyroxine (SYNTHROID) 88 MCG tablet Take 88 mcg by mouth daily.     lisinopril (ZESTRIL) 40 MG tablet TAKE 1 TABLET(40 MG) BY MOUTH DAILY 90 tablet 3   lurasidone (LATUDA) 40 MG TABS tablet Take 1 tablet (40 mg total) by mouth daily with breakfast. 30 tablet 2   metFORMIN (GLUCOPHAGE) 500 MG tablet Take 1 tablet (500 mg total) by mouth 2 (two) times daily with a meal. 180 tablet 0   Polyethylene Glycol 3350 (MIRALAX PO) Take by mouth.     Potassium Chloride ER 20 MEQ TBCR TAKE 1 TABLET BY MOUTH EVERY DAY 90 tablet 2   TART CHERRY PO Take by mouth daily.     No current facility-administered medications for this visit.    Allergies:   Other, Fentanyl, Levofloxacin, Midazolam, Pollen extract, Atorvastatin, Hydralazine hcl, and Rosuvastatin    ROS:  Please see the history of present illness.   Otherwise, review of systems are positive for none.    All other systems are reviewed and negative.    PHYSICAL EXAM: VS:  BP 124/81   Pulse 73   Ht '5\' 4"'$  (1.626 m)   Wt (!) 328 lb 3.2 oz (148.9 kg)   LMP 05/02/2017   SpO2 96%   BMI 56.34 kg/m  , BMI Body mass index is 56.34 kg/m. GEN:  No distress NECK:  No jugular venous distention at 90 degrees, waveform within normal limits, carotid upstroke brisk and symmetric, no bruits, no thyromegaly LYMPHATICS:  No cervical adenopathy LUNGS:  Clear to auscultation bilaterally BACK:  No CVA tenderness CHEST:  Unremarkable HEART:  S1 and S2 within normal limits, no S3, no S4, no clicks, no rubs, no murmurs ABD:  Positive bowel sounds normal in frequency in pitch, no bruits, no rebound, no guarding, unable to assess midline mass or bruit with the patient  seated. EXT:  2 plus pulses throughout, mild edema, no cyanosis no clubbing SKIN:  No rashes no nodules NEURO:  Cranial nerves II through XII grossly intact, motor grossly intact throughout PSYCH:  Cognitively intact, oriented to person place and time  EKG:  EKG  is  ordered today. Sinus rhythm, rate 73, left axis deviation, poor anterior R wave progression, no acute ST changes.  Recent Labs: 08/22/2021: BUN 36 09/08/2021: Creatinine 15.0; Potassium 4.2; Sodium 145; TSH 2.47 10/28/2021: ALT 18 11/08/2021: Hemoglobin 13.7; Platelets 262.0      Wt Readings from Last 3 Encounters:  06/05/22 (!) 328 lb 3.2 oz (148.9 kg)  05/17/22 (!) 330 lb (149.7 kg)  04/19/22 (!) 335 lb (152 kg)      Other studies Reviewed: Additional studies/ records that were reviewed today include: Labs Review of the above records demonstrates:  Please see elsewhere in the note.     ASSESSMENT AND PLAN:  HTN: Blood pressure is well-controlled.  No change in therapy.  PULMONARY NODULES:   She has follow-up with Dr. Elsworth Soho.  I will defer any further imaging.  Her last CT was in 2022.   LVH: This is likely related to hypertension.  She had no evidence of amyloid.  EDEMA: No change in therapy  CHRONIC DIASTOLIC HF/LVH:  She has severe LVH.   There is no amyloid on MRI.     OBESITY:  She is working with a weight loss program.    CORONARY CALCIUM:   This is followed at [facility].  No change in therapy.       SLEEP APNEA:   She wears CPAP.  She has O2 at night.   No change in therapy.   CAROTID STENOSIS :   She had very mild plaque in 2020.  No further imaging.   DYSLIPIDEMIA:    LDL was elevated but she has not tolerated increased statins.  LDL did come down from 130s to 102.  Continue current therapy.  She is not protein forward diet and we talked specifically about plant proteins . Current medicines are reviewed at length with the patient today.  The patient does not have concerns regarding medicines.  The  following changes have been made:  None  Labs/ tests ordered today include: None  Orders Placed This Encounter  Procedures   EKG 12-Lead      Disposition:   FU with me in 12 months.      Signed, Minus Breeding, MD  06/05/2022 12:45 PM    [place]

## 2022-06-05 ENCOUNTER — Ambulatory Visit: Payer: BC Managed Care – PPO | Attending: Cardiology | Admitting: Cardiology

## 2022-06-05 ENCOUNTER — Encounter: Payer: Self-pay | Admitting: Cardiology

## 2022-06-05 VITALS — BP 124/81 | HR 73 | Ht 64.0 in | Wt 328.2 lb

## 2022-06-05 DIAGNOSIS — I517 Cardiomegaly: Secondary | ICD-10-CM | POA: Diagnosis not present

## 2022-06-05 DIAGNOSIS — I5032 Chronic diastolic (congestive) heart failure: Secondary | ICD-10-CM | POA: Diagnosis not present

## 2022-06-05 NOTE — Patient Instructions (Signed)
Medication Instructions:  Your physician recommends that you continue on your current medications as directed. Please refer to the Current Medication list given to you today.  *If you need a refill on your cardiac medications before your next appointment, please call your pharmacy*   Lab Work: None  Testing/Procedures: None   Follow-Up: At [facility], you and your health needs are our priority.  As part of our continuing mission to provide you with exceptional heart care, we have created designated Provider Care Teams.  These Care Teams include your primary Cardiologist (physician) and Advanced Practice Providers (APPs -  Physician Assistants and Nurse Practitioners) who all work together to provide you with the care you need, when you need it.  Your next appointment:   1 year(s)  Provider:   Minus Breeding, MD

## 2022-06-13 ENCOUNTER — Other Ambulatory Visit: Payer: Self-pay | Admitting: Nurse Practitioner

## 2022-06-13 DIAGNOSIS — J309 Allergic rhinitis, unspecified: Secondary | ICD-10-CM

## 2022-06-14 ENCOUNTER — Encounter (INDEPENDENT_AMBULATORY_CARE_PROVIDER_SITE_OTHER): Payer: Self-pay | Admitting: Family Medicine

## 2022-06-14 ENCOUNTER — Ambulatory Visit (INDEPENDENT_AMBULATORY_CARE_PROVIDER_SITE_OTHER): Payer: BC Managed Care – PPO | Admitting: Family Medicine

## 2022-06-14 VITALS — BP 164/85 | HR 94 | Temp 98.2°F | Ht 64.0 in | Wt 325.0 lb

## 2022-06-14 DIAGNOSIS — R7303 Prediabetes: Secondary | ICD-10-CM | POA: Diagnosis not present

## 2022-06-14 DIAGNOSIS — I10 Essential (primary) hypertension: Secondary | ICD-10-CM

## 2022-06-14 DIAGNOSIS — Z6841 Body Mass Index (BMI) 40.0 and over, adult: Secondary | ICD-10-CM

## 2022-06-14 DIAGNOSIS — E669 Obesity, unspecified: Secondary | ICD-10-CM | POA: Diagnosis not present

## 2022-06-14 NOTE — Progress Notes (Deleted)
Patient has been very compliant with meal plan over the last few weeks.  She has been getting in at least 100g of protein daily and staying within 1600-1700 calories.  She has a job now in [employer].  She is curious to see her numbers to ensure she isn't being sabotaged with her health with her jobs.  With her job she is now eating breakfast daily and then isn't eating again until 4-5pm.  In the interim she will take in a protein bar. She has been also keeping an eye on achieving her protein in daily.  She is walking way more than she was previously.

## 2022-06-19 ENCOUNTER — Telehealth: Payer: Self-pay | Admitting: Nurse Practitioner

## 2022-06-19 NOTE — Telephone Encounter (Signed)
Contacted Madison Palmer to schedule their annual wellness visit. Appointment made for 07/03/22.  Pt returned call and scheduled  Madison Palmer AWV direct phone # [phone]

## 2022-06-19 NOTE — Telephone Encounter (Signed)
Called patient to schedule Medicare Annual Wellness Visit (AWV). Left message for patient to call back and schedule Medicare Annual Wellness Visit (AWV).  Last date of AWV: due awvi 11/29/21 per [facility]   Please schedule an appointment at any time with [facility]   If any questions, please contact me at [phone]  Thank you ,  Barkley Boards AWV direct phone # [phone]

## 2022-06-23 ENCOUNTER — Other Ambulatory Visit: Payer: Self-pay | Admitting: Cardiology

## 2022-06-23 DIAGNOSIS — I152 Hypertension secondary to endocrine disorders: Secondary | ICD-10-CM

## 2022-06-26 NOTE — Progress Notes (Signed)
Chief Complaint:   OBESITY Madison Palmer is here to discuss her progress with her obesity treatment plan along with follow-up of her obesity related diagnoses. Madison Palmer is on keeping a food journal and adhering to recommended goals of 1800-2000 calories and 180+ grams of protein and states she is following her eating plan approximately 100% of the time. Madison Palmer states she is walking more.   Today's visit was #: 19 Starting weight: 357 lbs Starting date: 02/27/2018 Today's weight: 325 lbs Today's date: 06/14/2022 Total lbs lost to date: 32 lbs Total lbs lost since last in-office visit: 5  Interim History: Madison Palmer has been very compliant with meal plan over the last few weeks.  She has been getting in at least 100g of protein daily and staying within 1600-1700 calories.  She has a job now in [employer].  She is curious to see her numbers to ensure she isn't being sabotaged with her health with her jobs.  With her job she is now eating breakfast daily and then isn't eating again until 4-5pm.  In the interim she will take in a protein bar. She has been also keeping an eye on achieving her protein in daily.  She is walking way more than she was previously.   Subjective:   1. Essential hypertension Blood pressure elevated today.  She was rushing to get to appointment.  Denies chest pain, chest pressure and headache.  Instructed to continue to check blood pressure at home.  It is time for 2nd dose of Clonidine.  2. Prediabetes A1c at 6.3.  Taking Metformin daily.  No GI side effects.  Assessment/Plan:   1. Essential hypertension Will follow up with cardiology as needed for further management.  2. Prediabetes Will continue current medications without change in medication or dose.  3. BMI 50.0-59.9, adult ([place])  4. Obesity with starting BMI of 61.2 Madison Palmer is currently in the action stage of change. As such, her goal is to continue with weight loss efforts. She has agreed to keeping a  food journal and adhering to recommended goals of 1600-1700 calories and 100+ grams protein os.   Exercise goals: All adults should avoid inactivity. Some physical activity is better than none, and adults who participate in any amount of physical activity gain some health benefits.  Behavioral modification strategies: increasing lean protein intake, meal planning and cooking strategies, keeping healthy foods in the home, and planning for success.  Madison Palmer has agreed to follow-up with our clinic in 5 weeks. She was informed of the importance of frequent follow-up visits to maximize her success with intensive lifestyle modifications for her multiple health conditions.   Objective:   Blood pressure (!) 164/85, pulse 94, temperature 98.2 F (36.8 C), height '5\' 4"'$  (1.626 m), weight (!) 325 lb (147.4 kg), last menstrual period 05/02/2017, SpO2 96 %. Body mass index is 55.79 kg/m.  General: Cooperative, alert, well developed, in no acute distress. HEENT: Conjunctivae and lids unremarkable. Cardiovascular: Regular rhythm.  Lungs: Normal work of breathing. Neurologic: No focal deficits.   Lab Results  Component Value Date   CREATININE 15.0 (A) 09/08/2021   BUN 36 (H) 08/22/2021   NA 145 09/08/2021   K 4.2 09/08/2021   CL 104 09/08/2021   CO2 23 (A) 09/08/2021   Lab Results  Component Value Date   ALT 18 10/28/2021   AST 15 10/28/2021   ALKPHOS 70 10/28/2021   BILITOT 0.5 10/28/2021   Lab Results  Component Value Date   HGBA1C  6.3 04/18/2022   HGBA1C 5.6 04/12/2021   HGBA1C 5.6 06/01/2020   HGBA1C 5.7 (H) 12/11/2019   HGBA1C 5.7 (H) 12/24/2018   Lab Results  Component Value Date   INSULIN 18.9 06/01/2020   INSULIN 11.4 12/11/2019   INSULIN 21.1 12/30/2018   INSULIN 9.6 02/27/2018   Lab Results  Component Value Date   TSH 2.47 09/08/2021   Lab Results  Component Value Date   CHOL 173 10/28/2021   HDL 48 10/28/2021   LDLCALC 102 (H) 10/28/2021   LDLDIRECT 94.0  04/18/2022   TRIG 132 10/28/2021   CHOLHDL 3.6 10/28/2021   Lab Results  Component Value Date   VD25OH 34.4 09/08/2021   VD25OH 29.0 (L) 09/01/2020   VD25OH 19.7 (L) 06/01/2020   Lab Results  Component Value Date   WBC 5.5 11/08/2021   HGB 13.7 11/08/2021   HCT 41.2 11/08/2021   MCV 86.0 11/08/2021   PLT 262.0 11/08/2021   Lab Results  Component Value Date   IRON 44 02/20/2018   TIBC 347 05/29/2017    Attestation Statements:   Reviewed by clinician on day of visit: allergies, medications, problem list, medical history, surgical history, family history, social history, and previous encounter notes.  I, Elnora Morrison, RMA am acting as transcriptionist for Coralie Common, MD.  I have reviewed the above documentation for accuracy and completeness, and I agree with the above. - Coralie Common, MD

## 2022-07-03 ENCOUNTER — Ambulatory Visit (INDEPENDENT_AMBULATORY_CARE_PROVIDER_SITE_OTHER): Payer: BC Managed Care – PPO

## 2022-07-03 VITALS — Ht 64.0 in | Wt 325.0 lb

## 2022-07-03 DIAGNOSIS — Z0000 Encounter for general adult medical examination without abnormal findings: Secondary | ICD-10-CM

## 2022-07-03 NOTE — Progress Notes (Signed)
I connected with  Madison Palmer on 07/03/22 by a audio enabled telemedicine application and verified that I am speaking with the correct person using two identifiers.  Patient Location: Home  Provider Location: Office/Clinic  I discussed the limitations of evaluation and management by telemedicine. The patient expressed understanding and agreed to proceed.  Subjective:   Madison Palmer is a 57 y.o. female who presents for an Initial Medicare Annual Wellness Visit.  Review of Systems     Cardiac Risk Factors include: advanced age (>70mn, >>79women);dyslipidemia;hypertension;obesity (BMI >30kg/m2)     Objective:    Today's Vitals   07/03/22 1357 07/03/22 1358  Weight: (!) 325 lb (147.4 kg)   Height: '5\' 4"'$  (1.626 m)   PainSc:  4    Body mass index is 55.79 kg/m.     07/03/2022    2:03 PM 09/16/2018   12:00 AM 09/15/2018    8:43 AM 09/13/2018   12:00 AM 09/11/2018    9:35 PM 08/19/2018    9:24 PM 08/19/2018    6:43 PM  Advanced Directives  Does Patient Have a Medical Advance Directive? No No   No No Unable to assess, patient is non-responsive or altered mental status  Would patient like information on creating a medical advance directive?  No - Patient declined   No - Patient declined No - Patient declined      Information is confidential and restricted. Go to Review Flowsheets to unlock data.    Current Medications (verified) Outpatient Encounter Medications as of 07/03/2022  Medication Sig   acetaminophen (TYLENOL) 500 MG tablet Take 1,000 mg by mouth 3 (three) times daily as needed for moderate pain or headache.   amLODipine (NORVASC) 10 MG tablet TAKE 1 TABLET BY MOUTH EVERY DAY   atorvastatin (LIPITOR) 20 MG tablet TAKE 1 TABLET(20 MG) BY MOUTH 3 TIMES A WEEK   benztropine (COGENTIN) 0.5 MG tablet Take 1 tablet (0.5 mg total) by mouth at bedtime.   cetirizine (ZYRTEC) 10 MG tablet TAKE 1 TABLET BY MOUTH EVERY DAY   Cholecalciferol (VITAMIN D3 MAXIMUM STRENGTH) 125 MCG  (5000 UT) capsule Take 5,000 Units by mouth daily.   cinacalcet (SENSIPAR) 30 MG tablet Take 30 mg by mouth daily.   cloNIDine (CATAPRES) 0.1 MG tablet Take 0.1 mg by mouth 3 (three) times daily.   esomeprazole (NEXIUM) 40 MG capsule TAKE 1 CAPSULE BY MOUTH TWICE DAILY   fenofibrate (TRICOR) 145 MG tablet TAKE 1 TABLET(145 MG) BY MOUTH DAILY   furosemide (LASIX) 40 MG tablet TAKE 1 TABLET BY MOUTH EVERY DAY   gabapentin (NEURONTIN) 100 MG capsule TAKE 2 CAPSULES(200 MG) BY MOUTH AT BEDTIME   labetalol (NORMODYNE) 300 MG tablet TAKE 1.5 TABLETS BY MOUTH TWICE DAILY   levothyroxine (SYNTHROID) 88 MCG tablet Take 88 mcg by mouth daily.   lisinopril (ZESTRIL) 40 MG tablet TAKE 1 TABLET BY MOUTH EVERY DAY   lurasidone (LATUDA) 40 MG TABS tablet Take 1 tablet (40 mg total) by mouth daily with breakfast.   metFORMIN (GLUCOPHAGE) 500 MG tablet Take 1 tablet (500 mg total) by mouth 2 (two) times daily with a meal.   Polyethylene Glycol 3350 (MIRALAX PO) Take by mouth.   Potassium Chloride ER 20 MEQ TBCR TAKE 1 TABLET BY MOUTH EVERY DAY   TART CHERRY PO Take by mouth daily.   No facility-administered encounter medications on file as of 07/03/2022.    Allergies (verified) Other, Fentanyl, Levofloxacin, Midazolam, Pollen extract, Atorvastatin, Hydralazine hcl, and  Rosuvastatin   History: Past Medical History:  Diagnosis Date   Acute on chronic respiratory failure with hypoxia and hypercapnia (HCC) 09/15/2018   Anemia    Anxiety    Arrhythmia    tachycardia   Arthritis    Bipolar 1 disorder ([place]) 09/12/2018   Bipolar I disorder, current or most recent episode manic, with psychotic features ([place])    Brief psychotic disorder ([place]) 08/22/2018   Chronic respiratory failure with hypoxia ([place]) 05/10/2018   Formatting of this note might be different from the original. Last Assessment & Plan:  Continue 1 L continuous on exertion and during sleep. On her next visit we will check OSA on CPAP/room air to  decide whether she needs to take oxygen along with her on her cruise in May   Depression    Diverticulitis    Generalized edema 03/12/2018   GERD (gastroesophageal reflux disease)    Glaucoma    Hyperlipidemia    Hyperparathyroidism ([place])    Hypertension    Inflammatory polyps of colon ([place])    Leg swelling 01/14/2019   LVH (left ventricular hypertrophy)    Lymphedema    Morbid (severe) obesity due to excess calories ([place]) 05/11/2017   PCOS (polycystic ovarian syndrome)    Prediabetes    Recurrent UTI    Sleep apnea    CPAP   Thyroid disease    Vitamin D deficiency    Past Surgical History:  Procedure Laterality Date   BREAST BIOPSY  2015   CESAREAN SECTION  2004   INNER EAR SURGERY     ear and sinus surgery   LAPAROSCOPIC REPAIR AND REMOVAL OF GASTRIC BAND     OOPHORECTOMY Left    TONSILLECTOMY AND ADENOIDECTOMY     Family History  Adopted: Yes  Problem Relation Age of Onset   Hearing loss Son        right    Healthy Son    Breast cancer Neg Hx    Social History   Socioeconomic History   Marital status: Married    Spouse name: Not on file   Number of children: 1   Years of education: Not on file   Highest education level: Not on file  Occupational History   Occupation: Unemployed  Tobacco Use   Smoking status: Never   Smokeless tobacco: Never  Vaping Use   Vaping Use: Never used  Substance and Sexual Activity   Alcohol use: Yes    Comment: social   Drug use: No   Sexual activity: Not Currently  Other Topics Concern   Not on file  Social History Narrative   Lives with son    Currently separated from spouse   Left handed   Caffeine: coffee daily: half-caff 3 cups/day and decaf maybe 3-5 cups/day, occasional mtn dew, tea 1-2 times per week   Social Determinants of Health   Financial Resource Strain: Low Risk  (07/03/2022)   Overall Financial Resource Strain (CARDIA)    Difficulty of Paying Living Expenses: Not hard at all  Food Insecurity: No Food  Insecurity (07/03/2022)   Hunger Vital Sign    Worried About Running Out of Food in the Last Year: Never true    [place] in the Last Year: Never true  Transportation Needs: No Transportation Needs (07/03/2022)   PRAPARE - Hydrologist (Medical): No    Lack of Transportation (Non-Medical): No  Physical Activity: Inactive (07/03/2022)   Exercise Vital Sign  Days of Exercise per Week: 0 days    Minutes of Exercise per Session: 0 min  Stress: No Stress Concern Present (07/03/2022)   [place]    Feeling of Stress : Not at all  Social Connections: Not on file    Tobacco Counseling Counseling given: Not Answered   Clinical Intake:  Pre-visit preparation completed: Yes  Pain : 0-10 Pain Score: 4  Pain Type: Chronic pain Pain Location: Knee Pain Orientation: Right Pain Descriptors / Indicators: Aching Pain Onset: More than a month ago Pain Frequency: Constant     Nutritional Status: BMI > 30  Obese Nutritional Risks: None Diabetes: No  How often do you need to have someone help you when you read instructions, pamphlets, or other written materials from your doctor or pharmacy?: 1 - Never  Diabetic? no  Interpreter Needed?: No  Information entered by :: NAllen LPN   Activities of Daily Living    07/03/2022    2:04 PM  In your present state of health, do you have any difficulty performing the following activities:  Hearing? 0  Vision? 0  Difficulty concentrating or making decisions? 0  Walking or climbing stairs? 1  Dressing or bathing? 0  Doing errands, shopping? 0  Preparing Food and eating ? N  Using the Toilet? N  In the past six months, have you accidently leaked urine? N  Do you have problems with loss of bowel control? N  Managing your Medications? N  Managing your Finances? N  Housekeeping or managing your Housekeeping? N    Patient Care Team: [name],  Charlene Brooke, NP as PCP - General (Internal Medicine) Minus Breeding, MD as PCP - Cardiology (Cardiology)  Indicate any recent Medical Services you may have received from other than [facility] providers in the past year (date may be approximate).     Assessment:   This is a routine wellness examination for Madison Palmer.  Hearing/Vision screen Vision Screening - Comments:: Regular eye exams, [facility]  Dietary issues and exercise activities discussed: Current Exercise Habits: The patient does not participate in regular exercise at present   Goals Addressed             This Visit's Progress    Patient Stated       07/03/2022, wants to lose weight       Depression Screen    07/03/2022    2:04 PM 04/18/2022    1:11 PM 10/28/2021    2:10 PM 05/21/2018    1:19 PM 02/27/2018   10:54 AM 05/15/2017    3:03 PM  PHQ 2/9 Scores  PHQ - 2 Score 0 0 1 0 2 0  PHQ- 9 Score   3  5   Exception Documentation     Medical reason     Fall Risk    07/03/2022    2:04 PM 04/18/2022    1:11 PM 10/28/2021    1:12 PM 04/08/2021    2:19 PM 05/21/2018    1:19 PM  [place] in the past year? 0 0 0 0 0  Number falls in past yr: 0 0 0 0   Injury with Fall? 0 0 0 0   Risk for fall due to : No Fall Risks;Medication side effect History of fall(s)  No Fall Risks   Follow up Falls prevention discussed;Education provided;Falls evaluation completed Falls evaluation completed  Falls evaluation completed     [place]  TO THE HOME:  Any stairs in or around the home? No  If so, are there any without handrails? N/a Home free of loose throw rugs in walkways, pet beds, electrical cords, etc? Yes  Adequate lighting in your home to reduce risk of falls? Yes   ASSISTIVE DEVICES UTILIZED TO PREVENT FALLS:  Life alert? No  Use of a cane, walker or w/c? No  Grab bars in the bathroom? No  Shower chair or bench in shower? No  Elevated toilet seat or a handicapped toilet? Yes   TIMED UP AND  GO:  Was the test performed? No .      Cognitive Function:        07/03/2022    2:05 PM  6CIT Screen  What Year? 0 points  What month? 0 points  What time? 0 points  Count back from 20 2 points  Months in reverse 0 points  Repeat phrase 0 points  Total Score 2 points    Immunizations Immunization History  Administered Date(s) Administered   Fluad Quad(high Dose 65+) 03/16/2021, 04/18/2022   Influenza,inj,Quad PF,6+ Mos 02/23/2014, 04/20/2015, 05/15/2017, 02/20/2018, 02/12/2019, 02/20/2019, 02/11/2020   Influenza-Unspecified 02/14/2013, 02/23/2014, 04/20/2015, 05/15/2017, 02/20/2018   Moderna SARS-COV2 Booster Vaccination 02/29/2020, 08/13/2020   Moderna Sars-Covid-2 Vaccination 07/27/2019, 08/24/2019, 09/26/2019   PNEUMOCOCCAL CONJUGATE-20 10/07/2020   Pneumococcal Polysaccharide-23 02/10/2008, 02/09/2009   Pneumococcal-Unspecified 02/10/2008, 02/09/2009   Tdap 11/22/2004, 09/10/2017   Zoster Recombinat (Shingrix) 10/07/2020, 12/08/2020    TDAP status: Up to date  Flu Vaccine status: Up to date  Pneumococcal vaccine status: Up to date  Covid-19 vaccine status: Completed vaccines  Qualifies for Shingles Vaccine? Yes   Zostavax completed No   Shingrix Completed?: Yes  Screening Tests Health Maintenance  Topic Date Due   Medicare Annual Wellness (AWV)  Never done   COVID-19 Vaccine (4 - 2023-24 season) 12/30/2021   OPHTHALMOLOGY EXAM  01/11/2022   Diabetic kidney evaluation - eGFR measurement  09/09/2022   HEMOGLOBIN A1C  10/18/2022   FOOT EXAM  10/29/2022   Diabetic kidney evaluation - Urine ACR  11/09/2022   PAP SMEAR-Modifier  10/08/2023   MAMMOGRAM  01/10/2024   DTaP/Tdap/Td (3 - Td or Tdap) 09/11/2027   COLONOSCOPY (Pts 45-7yr Insurance coverage will need to be confirmed)  11/05/2029   INFLUENZA VACCINE  Completed   Hepatitis C Screening  Completed   HIV Screening  Completed   Zoster Vaccines- Shingrix  Completed   HPV VACCINES  Aged Out     Health Maintenance  Health Maintenance Due  Topic Date Due   Medicare Annual Wellness (AWV)  Never done   COVID-19 Vaccine (4 - 2023-24 season) 12/30/2021   OPHTHALMOLOGY EXAM  01/11/2022    Colorectal cancer screening: Type of screening: Colonoscopy. Completed 11/06/2019. Repeat every 10 years  Mammogram status: Completed 01/09/2022. Repeat every year  Bone Density status: n/a  Lung Cancer Screening: (Low Dose CT Chest recommended if Age 431-80years, 30 pack-year currently smoking OR have quit w/in 15years.) does not qualify.   Lung Cancer Screening Referral: no  Additional Screening:  Hepatitis C Screening: does qualify; Completed 10/11/2020  Vision Screening: Recommended annual ophthalmology exams for early detection of glaucoma and other disorders of the eye. Is the patient up to date with their annual eye exam?  Yes  Who is the provider or what is the name of the office in which the patient attends annual eye exams? [facility] If pt is not established with a provider, would they like  to be referred to a provider to establish care? No .   Dental Screening: Recommended annual dental exams for proper oral hygiene  Community Resource Referral / Chronic Care Management: CRR required this visit?  No   CCM required this visit?  No      Plan:     I have personally reviewed and noted the following in the patient's chart:   Medical and social history Use of alcohol, tobacco or illicit drugs  Current medications and supplements including opioid prescriptions. Patient is not currently taking opioid prescriptions. Functional ability and status Nutritional status Physical activity Advanced directives List of other physicians Hospitalizations, surgeries, and ER visits in previous 12 months Vitals Screenings to include cognitive, depression, and falls Referrals and appointments  In addition, I have reviewed and discussed with patient certain preventive protocols, quality  metrics, and best practice recommendations. A written personalized care plan for preventive services as well as general preventive health recommendations were provided to patient.     Kellie Simmering, LPN   [id number]   Nurse Notes: none  Due to this being a virtual visit, the after visit summary with patients personalized plan was offered to patient via mail or my-chart.  Patient would like to access on my-chart/

## 2022-07-03 NOTE — Patient Instructions (Signed)
[name] , Thank you for taking time to come for your Medicare Wellness Visit. I appreciate your ongoing commitment to your health goals. Please review the following plan we discussed and let me know if I can assist you in the future.   These are the goals we discussed:  Goals      Blood Pressure < 130/80     Patient Stated     07/03/2022, wants to lose weight        This is a list of the screening recommended for you and due dates:  Health Maintenance  Topic Date Due   COVID-19 Vaccine (4 - 2023-24 season) 12/30/2021   Eye exam for diabetics  01/11/2022   Yearly kidney function blood test for diabetes  09/09/2022   Hemoglobin A1C  10/18/2022   Complete foot exam   10/29/2022   Yearly kidney health urinalysis for diabetes  11/09/2022   Medicare Annual Wellness Visit  07/03/2023   Pap Smear  10/08/2023   Mammogram  01/10/2024   DTaP/Tdap/Td vaccine (3 - Td or Tdap) 09/11/2027   Colon Cancer Screening  11/05/2029   Flu Shot  Completed   Hepatitis C Screening: USPSTF Recommendation to screen - Ages 18-79 yo.  Completed   HIV Screening  Completed   Zoster (Shingles) Vaccine  Completed   HPV Vaccine  Aged Out    Advanced directives: Advance directive discussed with you today.   Conditions/risks identified: none  Next appointment: Follow up in one year for your annual wellness visit.   Preventive Care 40-64 Years, Female Preventive care refers to lifestyle choices and visits with your health care provider that can promote health and wellness. What does preventive care include? A yearly physical exam. This is also called an annual well check. Dental exams once or twice a year. Routine eye exams. Ask your health care provider how often you should have your eyes checked. Personal lifestyle choices, including: Daily care of your teeth and gums. Regular physical activity. Eating a healthy diet. Avoiding tobacco and drug use. Limiting alcohol use. Practicing safe sex. Taking  low-dose aspirin daily starting at age 8. Taking vitamin and mineral supplements as recommended by your health care provider. What happens during an annual well check? The services and screenings done by your health care provider during your annual well check will depend on your age, overall health, lifestyle risk factors, and family history of disease. Counseling  Your health care provider may ask you questions about your: Alcohol use. Tobacco use. Drug use. Emotional well-being. Home and relationship well-being. Sexual activity. Eating habits. Work and work Statistician. Method of birth control. Menstrual cycle. Pregnancy history. Screening  You may have the following tests or measurements: Height, weight, and BMI. Blood pressure. Lipid and cholesterol levels. These may be checked every 5 years, or more frequently if you are over 102 years old. Skin check. Lung cancer screening. You may have this screening every year starting at age 67 if you have a 30-pack-year history of smoking and currently smoke or have quit within the past 15 years. Fecal occult blood test (FOBT) of the stool. You may have this test every year starting at age 18. Flexible sigmoidoscopy or colonoscopy. You may have a sigmoidoscopy every 5 years or a colonoscopy every 10 years starting at age 48. Hepatitis C blood test. Hepatitis B blood test. Sexually transmitted disease (STD) testing. Diabetes screening. This is done by checking your blood sugar (glucose) after you have not eaten for a while (  fasting). You may have this done every 1-3 years. Mammogram. This may be done every 1-2 years. Talk to your health care provider about when you should start having regular mammograms. This may depend on whether you have a family history of breast cancer. BRCA-related cancer screening. This may be done if you have a family history of breast, ovarian, tubal, or peritoneal cancers. Pelvic exam and Pap test. This may be done  every 3 years starting at age 96. Starting at age 8, this may be done every 5 years if you have a Pap test in combination with an HPV test. Bone density scan. This is done to screen for osteoporosis. You may have this scan if you are at high risk for osteoporosis. Discuss your test results, treatment options, and if necessary, the need for more tests with your health care provider. Vaccines  Your health care provider may recommend certain vaccines, such as: Influenza vaccine. This is recommended every year. Tetanus, diphtheria, and acellular pertussis (Tdap, Td) vaccine. You may need a Td booster every 10 years. Zoster vaccine. You may need this after age 84. Pneumococcal 13-valent conjugate (PCV13) vaccine. You may need this if you have certain conditions and were not previously vaccinated. Pneumococcal polysaccharide (PPSV23) vaccine. You may need one or two doses if you smoke cigarettes or if you have certain conditions. Talk to your health care provider about which screenings and vaccines you need and how often you need them. This information is not intended to replace advice given to you by your health care provider. Make sure you discuss any questions you have with your health care provider. Document Released: 05/14/2015 Document Revised: 01/05/2016 Document Reviewed: 02/16/2015 Elsevier Interactive Patient Education  2017 [place] Prevention in the Home Falls can cause injuries. They can happen to people of all ages. There are many things you can do to make your home safe and to help prevent falls. What can I do on the outside of my home? Regularly fix the edges of walkways and driveways and fix any cracks. Remove anything that might make you trip as you walk through a door, such as a raised step or threshold. Trim any bushes or trees on the path to your home. Use bright outdoor lighting. Clear any walking paths of anything that might make someone trip, such as rocks or  tools. Regularly check to see if handrails are loose or broken. Make sure that both sides of any steps have handrails. Any raised decks and porches should have guardrails on the edges. Have any leaves, snow, or ice cleared regularly. Use sand or salt on walking paths during winter. Clean up any spills in your garage right away. This includes oil or grease spills. What can I do in the bathroom? Use night lights. Install grab bars by the toilet and in the tub and shower. Do not use towel bars as grab bars. Use non-skid mats or decals in the tub or shower. If you need to sit down in the shower, use a plastic, non-slip stool. Keep the floor dry. Clean up any water that spills on the floor as soon as it happens. Remove soap buildup in the tub or shower regularly. Attach bath mats securely with double-sided non-slip rug tape. Do not have throw rugs and other things on the floor that can make you trip. What can I do in the bedroom? Use night lights. Make sure that you have a light by your bed that is easy to  reach. Do not use any sheets or blankets that are too big for your bed. They should not hang down onto the floor. Have a firm chair that has side arms. You can use this for support while you get dressed. Do not have throw rugs and other things on the floor that can make you trip. What can I do in the kitchen? Clean up any spills right away. Avoid walking on wet floors. Keep items that you use a lot in easy-to-reach places. If you need to reach something above you, use a strong step stool that has a grab bar. Keep electrical cords out of the way. Do not use floor polish or wax that makes floors slippery. If you must use wax, use non-skid floor wax. Do not have throw rugs and other things on the floor that can make you trip. What can I do with my stairs? Do not leave any items on the stairs. Make sure that there are handrails on both sides of the stairs and use them. Fix handrails that are  broken or loose. Make sure that handrails are as long as the stairways. Check any carpeting to make sure that it is firmly attached to the stairs. Fix any carpet that is loose or worn. Avoid having throw rugs at the top or bottom of the stairs. If you do have throw rugs, attach them to the floor with carpet tape. Make sure that you have a light switch at the top of the stairs and the bottom of the stairs. If you do not have them, ask someone to add them for you. What else can I do to help prevent falls? Wear shoes that: Do not have high heels. Have rubber bottoms. Are comfortable and fit you well. Are closed at the toe. Do not wear sandals. If you use a stepladder: Make sure that it is fully opened. Do not climb a closed stepladder. Make sure that both sides of the stepladder are locked into place. Ask someone to hold it for you, if possible. Clearly mark and make sure that you can see: Any grab bars or handrails. First and last steps. Where the edge of each step is. Use tools that help you move around (mobility aids) if they are needed. These include: Canes. Walkers. Scooters. Crutches. Turn on the lights when you go into a dark area. Replace any light bulbs as soon as they burn out. Set up your furniture so you have a clear path. Avoid moving your furniture around. If any of your floors are uneven, fix them. If there are any pets around you, be aware of where they are. Review your medicines with your doctor. Some medicines can make you feel dizzy. This can increase your chance of falling. Ask your doctor what other things that you can do to help prevent falls. This information is not intended to replace advice given to you by your health care provider. Make sure you discuss any questions you have with your health care provider. Document Released: 02/11/2009 Document Revised: 09/23/2015 Document Reviewed: 05/22/2014 Elsevier Interactive Patient Education  2017 [employer].

## 2022-07-10 ENCOUNTER — Telehealth: Payer: Self-pay | Admitting: Gastroenterology

## 2022-07-10 ENCOUNTER — Telehealth: Payer: Medicare Other | Admitting: Physician Assistant

## 2022-07-10 ENCOUNTER — Encounter: Payer: Self-pay | Admitting: Nurse Practitioner

## 2022-07-10 DIAGNOSIS — R140 Abdominal distension (gaseous): Secondary | ICD-10-CM

## 2022-07-10 DIAGNOSIS — K5900 Constipation, unspecified: Secondary | ICD-10-CM

## 2022-07-10 DIAGNOSIS — R1013 Epigastric pain: Secondary | ICD-10-CM

## 2022-07-10 NOTE — Progress Notes (Signed)
Based on what you shared with me, I feel your condition warrants further evaluation as soon as possible at an Emergency department.    NOTE: There will be NO CHARGE for this eVisit   If you are having a true medical emergency please call 911.      Emergency [facility]  Get Driving Directions  [id number]  [address]  [place], [place] [address]  Open 24/7/365      [facility] Emergency Department at [place]  [id number]  [place], [place] [address]  Open 24/7/365    Emergency [facility]  Get Driving Directions  [id number]  [address], [place] [address]  Open 24/7/365      [facility] at [facility]  Get Driving Directions  [id number]  [address]  [place], [place] [address]  Open 24/7/365    [facility]  Emergency [place]  Get Driving Directions  [id number]  [place], [place] [address]  Open 24/7/365    [place]  Emergency Department- [place]  Get Driving Directions  [id number]  [place], [place] [address]  Open 24/7/365    [facility]  Emergency [facility]  Get Driving Directions  [id number]  [address]  [place], [place] [address]  Open 24/7/365    I have spent 5 minutes in review of e-visit questionnaire, review and updating patient chart, medical decision making and response to patient.   Mar Daring, PA-C

## 2022-07-10 NOTE — Telephone Encounter (Signed)
I have spoken with the pt in regards to reflux symptoms.  She takes nexium twice daily, however, she is not taking it as directed 20-30 min before her meals.  She has also been eating a lot of the foods that cause the reflux to worsen. Chocolate, citrus and caffeine.  She has also been somewhat constipated.  She will begin miralax daily, add pepcid at bedtime and follow anti reflux precautions.  She has an appt with Dr Rush Landmark already scheduled and she will keep that as well.  She will call if these recommendations do not help.

## 2022-07-10 NOTE — Telephone Encounter (Signed)
Patient returning your call.

## 2022-07-10 NOTE — Telephone Encounter (Signed)
Patient called requesting to speak with a nurse regarding a lot of discomfort belching, heart burn and indigestion that she is experiencing for a few weeks now.

## 2022-07-10 NOTE — Telephone Encounter (Signed)
Left message on machine to call back  

## 2022-07-12 ENCOUNTER — Telehealth (HOSPITAL_BASED_OUTPATIENT_CLINIC_OR_DEPARTMENT_OTHER): Payer: BC Managed Care – PPO | Admitting: Psychiatry

## 2022-07-12 ENCOUNTER — Encounter (HOSPITAL_COMMUNITY): Payer: Self-pay | Admitting: Psychiatry

## 2022-07-12 DIAGNOSIS — F319 Bipolar disorder, unspecified: Secondary | ICD-10-CM | POA: Diagnosis not present

## 2022-07-12 DIAGNOSIS — R251 Tremor, unspecified: Secondary | ICD-10-CM

## 2022-07-12 DIAGNOSIS — F419 Anxiety disorder, unspecified: Secondary | ICD-10-CM | POA: Diagnosis not present

## 2022-07-12 MED ORDER — LURASIDONE HCL 40 MG PO TABS: 40.0000 mg | ORAL_TABLET | Freq: Every day | ORAL | 2 refills | Status: DC

## 2022-07-12 MED ORDER — BENZTROPINE MESYLATE 0.5 MG PO TABS: 0.5000 mg | ORAL_TABLET | Freq: Every day | ORAL | 2 refills | Status: DC

## 2022-07-12 NOTE — Progress Notes (Signed)
[facility] MD Virtual Progress Note   Patient Location: Home Provider Location: Home Office  I connect with patient by telephone and verified that I am speaking with correct person by using two identifiers. I discussed the limitations of evaluation and management by telemedicine and the availability of in person appointments. I also discussed with the patient that there may be a patient responsible charge related to this service. The patient expressed understanding and agreed to proceed.  Madison Palmer [id number] 57 y.o.  07/12/2022 10:03 AM  History of Present Illness:  Patient is evaluated by phone session.  She is doing well on her medication.  She is happy that her 76 year old son completed the requirement for her associate's and patient wanting to do the 4-year college but she is not sure if she will continue.  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  She started working in January and so far she is able to handle the job.  She denies any mania, psychosis, hallucination.  She sleeps okay with the help of CPAP.  Patient had a visit to [place] around Christmas and she was happy to spend time with the family.  Patient's mother-in-law and other family member lives there.  She was pleased that son is also able to see the family member.  So far patient tolerating all her medication and reported no major side effects.  Her tremors are much better since Cogentin added.  Recently she had a flareup of acid reflux.  She had a labs in December and found hemoglobin A1c 6.3.  She is not taking double dose of metformin.  She lost few pounds.  Patient denies any panic attack, crying spells or any feeling of hopelessness or suicidal thoughts.  She like to keep the current medication.  Past Psychiatric History: H/O depression and inpatient at [name] in May 20202 for delusion, psychosis and  violent behavior. D/C on Depakote 500 mg BID, Latuda 60 mg daily and Trilafon 12 mg a day. No h/o suicidal attempt, nightmares, panic attack, OCD or substance use.      Outpatient Encounter Medications as of 07/12/2022  Medication Sig   acetaminophen (TYLENOL) 500 MG tablet Take 1,000 mg by mouth 3 (three) times daily as needed for moderate pain or headache.   amLODipine (NORVASC) 10 MG tablet TAKE 1 TABLET BY MOUTH EVERY DAY   atorvastatin (LIPITOR) 20 MG tablet TAKE 1 TABLET(20 MG) BY MOUTH 3 TIMES A WEEK   benztropine (COGENTIN) 0.5 MG tablet Take 1 tablet (0.5 mg total) by mouth at bedtime.   cetirizine (ZYRTEC) 10 MG tablet TAKE 1 TABLET BY MOUTH EVERY DAY   Cholecalciferol (VITAMIN D3 MAXIMUM STRENGTH) 125 MCG (5000 UT) capsule Take 5,000 Units by mouth daily.   cinacalcet (SENSIPAR) 30 MG tablet Take 30 mg by mouth daily.   cloNIDine (CATAPRES) 0.1 MG tablet Take 0.1 mg by mouth 3 (three) times daily.   esomeprazole (NEXIUM) 40 MG capsule TAKE 1 CAPSULE BY MOUTH TWICE DAILY   fenofibrate (TRICOR) 145 MG tablet TAKE 1 TABLET(145 MG) BY MOUTH DAILY   furosemide (LASIX) 40 MG tablet TAKE 1 TABLET BY MOUTH EVERY DAY   gabapentin (NEURONTIN) 100 MG capsule TAKE 2 CAPSULES(200 MG) BY MOUTH AT BEDTIME   labetalol (NORMODYNE) 300 MG tablet TAKE 1.5 TABLETS BY MOUTH TWICE DAILY   levothyroxine (SYNTHROID) 88 MCG tablet Take 88 mcg by mouth daily.   lisinopril (ZESTRIL)  40 MG tablet TAKE 1 TABLET BY MOUTH EVERY DAY   lurasidone (LATUDA) 40 MG TABS tablet Take 1 tablet (40 mg total) by mouth daily with breakfast.   metFORMIN (GLUCOPHAGE) 500 MG tablet Take 1 tablet (500 mg total) by mouth 2 (two) times daily with a meal.   Polyethylene Glycol 3350 (MIRALAX PO) Take by mouth.   Potassium Chloride ER 20 MEQ TBCR TAKE 1 TABLET BY MOUTH EVERY DAY   TART CHERRY PO Take by mouth daily.   No facility-administered encounter medications on file as of 07/12/2022.    Recent Results (from the past 2160  hour(s))  Pathology Report     Status: None   Collection Time: 04/18/22  2:06 PM  Result Value Ref Range   Relevant History:      Comment: L91.8   Pathologist:      Comment: Odis Hollingshead, M.D., Ph.D., [place] Board Certification in Anatomic and Clinical Pathology and Cytopathology (Electronically Signed)    FINAL DIAGNOSIS:      Comment: Significant atypia, a melanocytic lesion, or  malignancy is not identified in the current  biopsy.    A Source      Comment: Skin Biopsy, left armpit    A Gross Description      Comment: Received in a single container of 10% neutral  buffered formalin labeled with two patient  identifiers, patient name and date of birth, as  well as "left armpit mole", are two shave  biopsies with the largest specimen measuring 0.9  x 0.7 x 0.2 cm. The epidermis has a flaky  tan-brown surface. The specimen is inked red,  quadrisected and submitted entirely in cassette  A1. The second specimen is a shave biopsy  measuring 0.7 x 0.3 x 0.2 cm. The epidermis has a  flaky tan-brown surface. The specimen is inked  blue, trisected and submitted entirely in  cassette A1.    A Diagnosis      Comment: Irritated verrucoid pigmented seborrheic  keratosis (x2). See Report Notes.   Hemoglobin A1c     Status: None   Collection Time: 04/18/22  2:14 PM  Result Value Ref Range   Hgb A1c MFr Bld 6.3 4.6 - 6.5 %    Comment: Glycemic Control Guidelines for People with Diabetes:Non Diabetic:  <6%Goal of Therapy: <7%Additional Action Suggested:  >8%   Direct LDL     Status: None   Collection Time: 04/18/22  2:14 PM  Result Value Ref Range   Direct LDL 94.0 mg/dL    Comment: Optimal:  <100 mg/dLNear or Above Optimal:  100-129 mg/dLBorderline High:  130-159 mg/dLHigh:  160-189 mg/dLVery High:  >190 mg/dL     Psychiatric Specialty Exam: Physical Exam  Review of Systems  Weight (!) 325 lb (147.4 kg), last menstrual period 05/02/2017.There is no height or weight on file  to calculate BMI.  General Appearance: NA  Eye Contact:  NA  Speech:  Clear and Coherent and Normal Rate  Volume:  Normal  Mood:  Euthymic  Affect:  NA  Thought Process:  Goal Directed  Orientation:  Full (Time, Place, and Person)  Thought Content:  Logical  Suicidal Thoughts:  No  Homicidal Thoughts:  No  Memory:  Immediate;   Good Recent;   Good Remote;   Good  Judgement:  Intact  Insight:  Present  Psychomotor Activity:  NA  Concentration:  Concentration: Good and Attention Span: Good  Recall:  Good  Fund of Knowledge:  Good  Language:  Good  Akathisia:  No  Handed:  Right  AIMS (if indicated):     Assets:  Communication Skills Desire for Improvement Housing Resilience Transportation  ADL's:  Intact  Cognition:  WNL  Sleep:  good with CPAP     Assessment/Plan: Tremor - Plan: benztropine (COGENTIN) 0.5 MG tablet  Bipolar 1 disorder (HCC) - Plan: benztropine (COGENTIN) 0.5 MG tablet, lurasidone (LATUDA) 40 MG TABS tablet  Anxiety - Plan: lurasidone (LATUDA) 40 MG TABS tablet  I reviewed blood work results.  Mild elevation of hemoglobin A1c.  Patient is stable on current medication.  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  Congratulations given.  Patient comfortable with the current dose of medication.  Continue Latuda 40 mg daily, Cogentin 0.5 mg at bedtime.  Recommended to call us back if she has any question or any concern.  Follow-up in 3 months.   Follow Up Instructions:     I discussed the assessment and treatment plan with the patient. The patient was provided an opportunity to ask questions and all were answered. The patient agreed with the plan and demonstrated an understanding of the instructions.   The patient was advised to call back or seek an in-person evaluation if the symptoms worsen or if the condition fails to improve as anticipated.    Collaboration of Care: Other provider involved in patient's  care AEB notes are available in epic to review.  Patient/Guardian was advised Release of Information must be obtained prior to any record release in order to collaborate their care with an outside provider. Patient/Guardian was advised if they have not already done so to contact the registration department to sign all necessary forms in order for [place] to release information regarding their care.   Consent: Patient/Guardian gives verbal consent for treatment and assignment of benefits for services provided during this visit. Patient/Guardian expressed understanding and agreed to proceed.     I provided 19 minutes of non face to face time during this encounter.  Kathlee Nations, MD 07/12/2022

## 2022-07-13 ENCOUNTER — Encounter: Payer: Self-pay | Admitting: Nurse Practitioner

## 2022-07-13 ENCOUNTER — Ambulatory Visit (INDEPENDENT_AMBULATORY_CARE_PROVIDER_SITE_OTHER): Payer: BC Managed Care – PPO | Admitting: Nurse Practitioner

## 2022-07-13 VITALS — BP 130/76 | HR 73 | Temp 97.8°F | Resp 16 | Ht 64.0 in | Wt 324.8 lb

## 2022-07-13 DIAGNOSIS — E1169 Type 2 diabetes mellitus with other specified complication: Secondary | ICD-10-CM

## 2022-07-13 DIAGNOSIS — K219 Gastro-esophageal reflux disease without esophagitis: Secondary | ICD-10-CM

## 2022-07-13 DIAGNOSIS — F250 Schizoaffective disorder, bipolar type: Secondary | ICD-10-CM | POA: Diagnosis not present

## 2022-07-13 DIAGNOSIS — E785 Hyperlipidemia, unspecified: Secondary | ICD-10-CM | POA: Diagnosis not present

## 2022-07-13 LAB — COMPREHENSIVE METABOLIC PANEL
ALT: 19 U/L (ref 0–35)
AST: 18 U/L (ref 0–37)
Albumin: 3.9 g/dL (ref 3.5–5.2)
Alkaline Phosphatase: 74 U/L (ref 39–117)
BUN: 14 mg/dL (ref 6–23)
CO2: 31 mEq/L (ref 19–32)
Calcium: 10 mg/dL (ref 8.4–10.5)
Chloride: 101 mEq/L (ref 96–112)
Creatinine, Ser: 0.94 mg/dL (ref 0.40–1.20)
GFR: 67.95 mL/min (ref >60.00)
Glucose, Bld: 112 mg/dL — ABNORMAL HIGH (ref 70–99)
Potassium: 4.1 mEq/L (ref 3.5–5.1)
Sodium: 142 mEq/L (ref 135–145)
Total Bilirubin: 0.7 mg/dL (ref 0.2–1.2)
Total Protein: 6.9 g/dL (ref 6.0–8.3)

## 2022-07-13 MED ORDER — SUCRALFATE 1 G PO TABS: 1.0000 g | ORAL_TABLET | Freq: Three times a day (TID) | ORAL | 0 refills | Status: DC

## 2022-07-13 NOTE — Assessment & Plan Note (Signed)
Epigastric pain with bloating x 3days, Chronic current use of nexium '40mg'$  BID Added famotidine at hs with no improvement. Denies any constipation or diarrhea or melena or hematochezia or nausea S/p cholecystectomy. Last EGD 2014: non erosive esophagitis and hiatal hernia She admits to increase caffeine consumption in last 2weeks: 2-3cups of hot black teas and 1cup of black coffee.  Stop all forms of caffeine or chocolate or citrus or acidic or spicy foods Hold nexium while using carafate x 1week, then resume Schedule appt with GI if no improvement in 1week Check cmp

## 2022-07-13 NOTE — Progress Notes (Signed)
Established Patient Visit  Patient: Madison Palmer   DOB: 1965/10/23   57 y.o. Female  MRN: [id number] Visit Date: 07/13/2022  Subjective:    Chief Complaint  Patient presents with   Gastroesophageal Reflux    Took pepcid and nexium with no relief.  Changed diet to help reduce reflux   GERD (gastroesophageal reflux disease) Epigastric pain with bloating x 3days, Chronic current use of nexium '40mg'$  BID Added famotidine at hs with no improvement. Denies any constipation or diarrhea or melena or hematochezia or nausea S/p cholecystectomy. Last EGD 2014: non erosive esophagitis and hiatal hernia She admits to increase caffeine consumption in last 2weeks: 2-3cups of hot black teas and 1cup of black coffee.  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 Under the care of psychiatry: Dr. Maxine Glenn mood with congentin and latuda. Reports dry mouth and tremors with current meds. Use of sensipar to control tremore Advised to use biotene and maintain adequate oral hydration to manage dry mouth.  Hyperlipidemia associated with type 2 diabetes mellitus ([place]) Current use of fenofibrate and low dose atorvastatin Unable to tolerate crestor LDL at 94.  Unable to increase dose at this time  Reviewed medical, surgical, and social history today  Medications: Outpatient Medications Prior to Visit  Medication Sig   acetaminophen (TYLENOL) 500 MG tablet Take 1,000 mg by mouth 3 (three) times daily as needed for moderate pain or headache.   amLODipine (NORVASC) 10 MG tablet TAKE 1 TABLET BY MOUTH EVERY DAY   atorvastatin (LIPITOR) 20 MG tablet TAKE 1 TABLET(20 MG) BY MOUTH 3 TIMES A WEEK   benztropine (COGENTIN) 0.5 MG tablet Take 1 tablet (0.5 mg total) by  mouth at bedtime.   cetirizine (ZYRTEC) 10 MG tablet TAKE 1 TABLET BY MOUTH EVERY DAY   Cholecalciferol (VITAMIN D3 MAXIMUM STRENGTH) 125 MCG (5000 UT) capsule Take 5,000 Units by mouth daily.   cinacalcet (SENSIPAR) 30 MG tablet Take 30 mg by mouth daily.   cloNIDine (CATAPRES) 0.1 MG tablet Take 0.1 mg by mouth 3 (three) times daily.   esomeprazole (NEXIUM) 40 MG capsule TAKE 1 CAPSULE BY MOUTH TWICE DAILY   fenofibrate (TRICOR) 145 MG tablet TAKE 1 TABLET(145 MG) BY MOUTH DAILY   furosemide (LASIX) 40 MG tablet TAKE 1 TABLET BY MOUTH EVERY DAY   gabapentin (NEURONTIN) 100 MG capsule TAKE 2 CAPSULES(200 MG) BY MOUTH AT BEDTIME   labetalol (NORMODYNE) 300 MG tablet TAKE 1.5 TABLETS BY MOUTH TWICE DAILY   levothyroxine (SYNTHROID) 88 MCG tablet Take 88 mcg by mouth daily.   lisinopril (ZESTRIL) 40 MG tablet TAKE 1 TABLET BY MOUTH EVERY DAY   lurasidone (LATUDA) 40 MG TABS tablet Take 1 tablet (40 mg total) by mouth daily with breakfast.   metFORMIN (GLUCOPHAGE) 500 MG tablet Take 1 tablet (500 mg total) by mouth 2 (two) times daily with a meal.   Polyethylene Glycol 3350 (MIRALAX PO) Take by mouth.   Potassium Chloride ER 20 MEQ TBCR TAKE 1 TABLET BY MOUTH EVERY DAY   TART CHERRY PO Take by mouth daily.   No facility-administered medications prior to visit.   Reviewed past medical and social history.   ROS per  HPI above  Last metabolic panel Lab Results  Component Value Date   GLUCOSE 98 08/22/2021   NA 145 09/08/2021   K 4.2 09/08/2021   CL 104 09/08/2021   CO2 23 (A) 09/08/2021   BUN 36 (H) 08/22/2021   CREATININE 15.0 (A) 09/08/2021   EGFR 49 (L) 08/22/2021   CALCIUM 10.7 09/08/2021   PHOS 2.9 (L) 09/01/2020   PROT 6.9 10/28/2021   ALBUMIN 4.1 10/28/2021   LABGLOB 2.7 06/10/2020   AGRATIO 1.6 06/10/2020   BILITOT 0.5 10/28/2021   ALKPHOS 70 10/28/2021   AST 15 10/28/2021   ALT 18 10/28/2021   ANIONGAP 6 09/25/2018   Last lipids Lab Results  Component Value Date    CHOL 173 10/28/2021   HDL 48 10/28/2021   LDLCALC 102 (H) 10/28/2021   LDLDIRECT 94.0 04/18/2022   TRIG 132 10/28/2021   CHOLHDL 3.6 10/28/2021        Objective:  BP 130/76 (BP Location: Left Arm, Patient Position: Sitting, Cuff Size: Large)   Pulse 73   Temp 97.8 F (36.6 C) (Temporal)   Resp 16   Ht '5\' 4"'$  (1.626 m)   Wt (!) 324 lb 12.8 oz (147.3 kg)   LMP 05/02/2017   SpO2 94%   BMI 55.75 kg/m      Physical Exam Vitals reviewed.  Cardiovascular:     Rate and Rhythm: Normal rate.     Pulses: Normal pulses.  Pulmonary:     Effort: Pulmonary effort is normal.  Abdominal:     General: Bowel sounds are normal. There is no distension.     Palpations: Abdomen is soft. There is no mass.     Tenderness: There is abdominal tenderness in the epigastric area. There is no right CVA tenderness, left CVA tenderness or guarding. Negative signs include Murphy's sign and McBurney's sign.  Neurological:     Mental Status: She is alert and oriented to person, place, and time.     No results found for any visits on 07/13/22.    Assessment & Plan:    Problem List Items Addressed This Visit       Digestive   GERD (gastroesophageal reflux disease) - Primary    Epigastric pain with bloating x 3days, Chronic current use of nexium '40mg'$  BID Added famotidine at hs with no improvement. Denies any constipation or diarrhea or melena or hematochezia or nausea S/p cholecystectomy. Last EGD 2014: non erosive esophagitis and hiatal hernia She admits to increase caffeine consumption in last 2weeks: 2-3cups of hot black teas and 1cup of black coffee.  Stop all forms of caffeine or chocolate or citrus or acidic or spicy foods Hold nexium while using carafate x 1week, then resume Schedule appt with GI if no improvement in 1week Check cmp      Relevant Medications   sucralfate (CARAFATE) 1 g tablet   Other Relevant Orders   Comprehensive metabolic panel   H. pylori antigen, stool      Endocrine   Hyperlipidemia associated with type 2 diabetes mellitus ([place])    Current use of fenofibrate and low dose atorvastatin Unable to tolerate crestor LDL at 94.  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 Under the care of psychiatry: Dr. Maxine Glenn mood with congentin and latuda. Reports dry mouth and tremors with current meds. Use of sensipar to control  tremore Advised to use biotene and maintain adequate oral hydration to manage dry mouth.      Return if symptoms worsen or fail to improve.     Wilfred Lacy, NP

## 2022-07-13 NOTE — Patient Instructions (Signed)
Stop all forms of caffeine or chocolate or citrus or acidic or spicy foods Try biotene rinse for dry mouth Hold nexium while using carafate x 1week Schedule appt with GI if no improvement in 1week Go to lab

## 2022-07-13 NOTE — Assessment & Plan Note (Signed)
Current use of fenofibrate and low dose atorvastatin Unable to tolerate crestor LDL at 94.  Unable to increase dose at this time

## 2022-07-13 NOTE — Assessment & Plan Note (Signed)
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 Under the care of psychiatry: Dr. Maxine Glenn mood with congentin and latuda. Reports dry mouth and tremors with current meds. Use of sensipar to control tremore Advised to use biotene and maintain adequate oral hydration to manage dry mouth.

## 2022-07-14 ENCOUNTER — Other Ambulatory Visit: Payer: Self-pay | Admitting: Family Medicine

## 2022-07-16 ENCOUNTER — Other Ambulatory Visit (INDEPENDENT_AMBULATORY_CARE_PROVIDER_SITE_OTHER): Payer: Self-pay | Admitting: Family Medicine

## 2022-07-16 DIAGNOSIS — R7303 Prediabetes: Secondary | ICD-10-CM

## 2022-07-17 ENCOUNTER — Encounter: Payer: Self-pay | Admitting: Nurse Practitioner

## 2022-07-17 DIAGNOSIS — M5010 Cervical disc disorder with radiculopathy, unspecified cervical region: Secondary | ICD-10-CM

## 2022-07-17 DIAGNOSIS — M5136 Other intervertebral disc degeneration, lumbar region: Secondary | ICD-10-CM

## 2022-07-18 MED ORDER — GABAPENTIN 100 MG PO CAPS: ORAL_CAPSULE | ORAL | 3 refills | Status: DC

## 2022-07-19 ENCOUNTER — Encounter (INDEPENDENT_AMBULATORY_CARE_PROVIDER_SITE_OTHER): Payer: Self-pay | Admitting: Family Medicine

## 2022-07-19 ENCOUNTER — Ambulatory Visit: Payer: BC Managed Care – PPO | Admitting: Nurse Practitioner

## 2022-07-19 ENCOUNTER — Other Ambulatory Visit: Payer: Self-pay | Admitting: Nurse Practitioner

## 2022-07-19 ENCOUNTER — Ambulatory Visit (INDEPENDENT_AMBULATORY_CARE_PROVIDER_SITE_OTHER): Payer: BC Managed Care – PPO | Admitting: Family Medicine

## 2022-07-19 VITALS — BP 146/80 | HR 102 | Temp 98.5°F | Ht 64.0 in | Wt 320.0 lb

## 2022-07-19 DIAGNOSIS — E669 Obesity, unspecified: Secondary | ICD-10-CM | POA: Diagnosis not present

## 2022-07-19 DIAGNOSIS — Z6841 Body Mass Index (BMI) 40.0 and over, adult: Secondary | ICD-10-CM | POA: Diagnosis not present

## 2022-07-19 DIAGNOSIS — R7303 Prediabetes: Secondary | ICD-10-CM

## 2022-07-19 DIAGNOSIS — K219 Gastro-esophageal reflux disease without esophagitis: Secondary | ICD-10-CM

## 2022-07-19 DIAGNOSIS — E7849 Other hyperlipidemia: Secondary | ICD-10-CM

## 2022-07-19 MED ORDER — METFORMIN HCL 500 MG PO TABS: 500.0000 mg | ORAL_TABLET | Freq: Two times a day (BID) | ORAL | 0 refills | Status: DC

## 2022-07-19 NOTE — Progress Notes (Signed)
Chief Complaint:   OBESITY Madison Palmer is here to discuss her progress with her obesity treatment plan along with follow-up of her obesity related diagnoses. Madison Palmer is on keeping a food journal and adhering to recommended goals of 1600-1700 calories and 100+ protein and states she is following her eating plan approximately 70-90% of the time. Madison Palmer states she is walking to work.  Today's visit was #: 67 Starting weight: 357 lbs Starting date: 02/27/2018 Today's weight: 320 lbs Today's date: 07/19/2022 Total lbs lost to date: 37 lbs Total lbs lost since last in-office visit: 5 lbs  Interim History: Patient has been tracking 100% of the time and hitting the goals of protein and calories around 70-90% of the time.  She is working now and feels frustrated sometimes but ultimately realizes its been a good move for her in terms of getting out of her house. She is not planning to work over the summer.  She saw her PCP last week over concerns of reflux.  She was given carafate which has improved her symptoms. Has been journaling everyday and her last two weeks have been particularly difficult so she hasn't been eating much.  Her protein was between 50-90 in the lat few weeks.   Subjective:   1. Prediabetes Patient is on Metformin with no GI side effects.  Previously on GLP-G1P with no weight loss.   2. Other hyperlipidemia Patient is on Lipitor.  Patient denies side effects of myalgia or transaminitis.   Assessment/Plan:   1. Prediabetes Refill - metFORMIN (GLUCOPHAGE) 500 MG tablet; Take 1 tablet (500 mg total) by mouth 2 (two) times daily with a meal.  Dispense: 180 tablet; Refill: 0  2. Other hyperlipidemia Continue Lipitor.  No change in medication or dosing.   3. BMI 50.0-59.9, adult (Madison Palmer)  4. Obesity with starting BMI of 61.2 Madison Palmer is currently in the action stage of change. As such, her goal is to continue with weight loss efforts. She has agreed to keeping a food journal and  adhering to recommended goals of 1600-1700 calories and 100+ protein daily.   Exercise goals: All adults should avoid inactivity. Some physical activity is better than none, and adults who participate in any amount of physical activity gain some health benefits.  Behavioral modification strategies: increasing lean protein intake, meal planning and cooking strategies, keeping healthy foods in the home, planning for success, and keeping a strict food journal.  Madison Palmer has agreed to follow-up with our clinic in 5 weeks. She was informed of the importance of frequent follow-up visits to maximize her success with intensive lifestyle modifications for her multiple health conditions.   Objective:   Blood pressure (!) 146/80, pulse (!) 102, temperature 98.5 F (36.9 C), height 5\' 4"  (1.626 m), weight (!) 320 lb (145.2 kg), last menstrual period 05/02/2017, SpO2 93 %. Body mass index is 54.93 kg/m.  General: Cooperative, alert, well developed, in no acute distress. HEENT: Conjunctivae and lids unremarkable. Cardiovascular: Regular rhythm.  Lungs: Normal work of breathing. Neurologic: No focal deficits.   Lab Results  Component Value Date   CREATININE 0.94 07/13/2022   BUN 14 07/13/2022   NA 142 07/13/2022   K 4.1 07/13/2022   CL 101 07/13/2022   CO2 31 07/13/2022   Lab Results  Component Value Date   ALT 19 07/13/2022   AST 18 07/13/2022   ALKPHOS 74 07/13/2022   BILITOT 0.7 07/13/2022   Lab Results  Component Value Date   HGBA1C 6.3 04/18/2022  HGBA1C 5.6 04/12/2021   HGBA1C 5.6 06/01/2020   HGBA1C 5.7 (H) 12/11/2019   HGBA1C 5.7 (H) 12/24/2018   Lab Results  Component Value Date   INSULIN 18.9 06/01/2020   INSULIN 11.4 12/11/2019   INSULIN 21.1 12/30/2018   INSULIN 9.6 02/27/2018   Lab Results  Component Value Date   TSH 2.47 09/08/2021   Lab Results  Component Value Date   CHOL 173 10/28/2021   HDL 48 10/28/2021   LDLCALC 102 (H) 10/28/2021   LDLDIRECT 94.0  04/18/2022   TRIG 132 10/28/2021   CHOLHDL 3.6 10/28/2021   Lab Results  Component Value Date   VD25OH 34.4 09/08/2021   VD25OH 29.0 (L) 09/01/2020   VD25OH 19.7 (L) 06/01/2020   Lab Results  Component Value Date   WBC 5.5 11/08/2021   HGB 13.7 11/08/2021   HCT 41.2 11/08/2021   MCV 86.0 11/08/2021   PLT 262.0 11/08/2021   Lab Results  Component Value Date   IRON 44 02/20/2018   TIBC 347 05/29/2017   Attestation Statements:   Reviewed by clinician on day of visit: allergies, medications, problem list, medical history, surgical history, family history, social history, and previous encounter notes.  I, Davy Pique, RMA, am acting as transcriptionist for Coralie Common, MD.  I have reviewed the above documentation for accuracy and completeness, and I agree with the above. - Coralie Common, MD

## 2022-07-21 ENCOUNTER — Ambulatory Visit (HOSPITAL_BASED_OUTPATIENT_CLINIC_OR_DEPARTMENT_OTHER): Payer: BC Managed Care – PPO | Admitting: Pulmonary Disease

## 2022-07-21 ENCOUNTER — Encounter (HOSPITAL_BASED_OUTPATIENT_CLINIC_OR_DEPARTMENT_OTHER): Payer: Self-pay | Admitting: Pulmonary Disease

## 2022-07-21 VITALS — BP 150/90 | HR 87 | Ht 64.0 in | Wt 323.6 lb

## 2022-07-21 DIAGNOSIS — I10 Essential (primary) hypertension: Secondary | ICD-10-CM | POA: Diagnosis not present

## 2022-07-21 DIAGNOSIS — G4733 Obstructive sleep apnea (adult) (pediatric): Secondary | ICD-10-CM

## 2022-07-21 DIAGNOSIS — R918 Other nonspecific abnormal finding of lung field: Secondary | ICD-10-CM

## 2022-07-21 NOTE — Patient Instructions (Signed)
CPAP is working well  X CTc hest wo con - to follow up on lung nodules

## 2022-07-21 NOTE — Assessment & Plan Note (Signed)
CPAP download was reviewed which shows excellent control of events on 9 cm with minimal leak.  She is very compliant and CPAP recently helped improve her daytime somnolence and fatigue  Weight loss encouraged, compliance with goal of at least 4-6 hrs every night is the expectation. Advised against medications with sedative side effects Cautioned against driving when sleepy - understanding that sleepiness will vary on a day to day basis

## 2022-07-21 NOTE — Progress Notes (Signed)
   Subjective:    Patient ID: Madison Palmer, female    DOB: [birth year], 57 y.o.   MRN: [id number]  HPI  57 yo morbidly obese woman with chronic lymphedema  for FU of severe obstructive sleep apnea & pulm nodules. Noted 05/2018 ? sarcoidosis    She underwent lap band in 2009 and lost weight and CPAP was then decreased to 9 cm in 06/2009.  lap band was removed in 2013.   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  She continues to use her oxygen and CPAP.  No problems with mask or pressure. She underwent cardiac workup including cardiac MRI and daughter which again showed multiple pulmonary nodules which we have noted before.  This was negative for amyloidosis. She reports significant lymphedema and is able to continue with her Lasix daily even though she is working   Significant tests/ events reviewed    12/2018 ONO showed SpO2 low 85%, baseline 91%. 8 mins <88% >>  continue O2 blended into CPAP   PFTs severe restriction due to obesity 01/2020-FVC 1.15 (34%), FEV1 0.88 (33%), ratio 76, DLCOco 162%   CT chest 07/2020 multiple pulmonary nodules, all stable CT chest 01/2020  stable pulmonary nodules, favoring benign cause CT angiogram 05/10/18  was negative for pulmonary emboli, but showed numerous pulmonary nodules largest 15 mm in the right lower lobe and 13 mm in the lingula. Mosaic attenuation was also noted  suggesting air trapping     PET 05/24/18  low-grade hypermetabolism SUV 3-4 range in the nodules.  Mediastinum did not show any hypermetabolic     prior CT chest from 2010 and 2011  >> shows multiple noncalcified pulmonary nodules 3 to 6 mm, largest being 6 mm    NP SG 2008 showed AHI of 100/hour which was corrected by CPAP of 14 cm.      rheumatology evaluation >> positive ANA and positive RA factor but negative  CCP and no evidence of synovitis , ENA neg ACE 40  Review of Systems neg for any significant sore throat, dysphagia, itching, sneezing, nasal congestion or excess/ purulent secretions, fever, chills, sweats, unintended wt loss, pleuritic or exertional cp, hempoptysis, orthopnea pnd or change in chronic leg swelling. Also denies presyncope, palpitations, heartburn, abdominal pain, nausea, vomiting, diarrhea or change in bowel or urinary habits, dysuria,hematuria, rash, arthralgias, visual complaints, headache, numbness weakness or ataxia.     Objective:   Physical Exam  Gen. Pleasant, obese, in no distress ENT - no lesions, no post nasal drip Neck: No JVD, no thyromegaly, no carotid bruits Lungs: no use of accessory muscles, no dullness to percussion, decreased without rales or rhonchi  Cardiovascular: Rhythm regular, heart sounds  normal, no murmurs or gallops, 1+ peripheral edema Musculoskeletal: No deformities, no cyanosis or clubbing , no tremors       Assessment & Plan:

## 2022-07-21 NOTE — Assessment & Plan Note (Signed)
Multiple lung nodules previously noted and considered benign, largest was 15 mm in the right lower lobe. She had cardiac MRI which again showed multiple nodules.  We will obtain 2-year follow-up to clarify whether these are the same nodules and any new ones have occurred or not

## 2022-07-21 NOTE — Assessment & Plan Note (Signed)
Blood pressure high today.  She is compliant with her medications. Will follow-up with PCP and cardiology

## 2022-07-22 ENCOUNTER — Encounter: Payer: Self-pay | Admitting: Nurse Practitioner

## 2022-07-27 ENCOUNTER — Other Ambulatory Visit (HOSPITAL_COMMUNITY): Payer: Self-pay | Admitting: Psychiatry

## 2022-07-27 DIAGNOSIS — F419 Anxiety disorder, unspecified: Secondary | ICD-10-CM

## 2022-07-27 DIAGNOSIS — F319 Bipolar disorder, unspecified: Secondary | ICD-10-CM

## 2022-07-30 ENCOUNTER — Ambulatory Visit (HOSPITAL_BASED_OUTPATIENT_CLINIC_OR_DEPARTMENT_OTHER)
Admission: RE | Admit: 2022-07-30 | Discharge: 2022-07-30 | Disposition: A | Discharge status: Home / Self Care | Payer: BC Managed Care – PPO | Source: Ambulatory Visit | Attending: Pulmonary Disease | Admitting: Pulmonary Disease

## 2022-07-30 DIAGNOSIS — R918 Other nonspecific abnormal finding of lung field: Secondary | ICD-10-CM | POA: Diagnosis not present

## 2022-07-31 ENCOUNTER — Other Ambulatory Visit: Payer: Self-pay

## 2022-07-31 DIAGNOSIS — R918 Other nonspecific abnormal finding of lung field: Secondary | ICD-10-CM

## 2022-08-01 ENCOUNTER — Telehealth: Payer: Self-pay | Admitting: Pulmonary Disease

## 2022-08-01 NOTE — Telephone Encounter (Signed)
Is PET scan to be next avail or prior to 1 yr f/u

## 2022-08-09 ENCOUNTER — Other Ambulatory Visit: Payer: Self-pay | Admitting: Cardiology

## 2022-08-15 ENCOUNTER — Other Ambulatory Visit (INDEPENDENT_AMBULATORY_CARE_PROVIDER_SITE_OTHER): Payer: BC Managed Care – PPO

## 2022-08-15 ENCOUNTER — Ambulatory Visit: Payer: BC Managed Care – PPO | Admitting: Gastroenterology

## 2022-08-15 ENCOUNTER — Encounter: Payer: Self-pay | Admitting: Gastroenterology

## 2022-08-15 VITALS — BP 140/78 | HR 80 | Ht 62.0 in | Wt 318.0 lb

## 2022-08-15 DIAGNOSIS — K219 Gastro-esophageal reflux disease without esophagitis: Secondary | ICD-10-CM | POA: Diagnosis not present

## 2022-08-15 DIAGNOSIS — R194 Change in bowel habit: Secondary | ICD-10-CM

## 2022-08-15 DIAGNOSIS — Z86010 Personal history of colonic polyps: Secondary | ICD-10-CM

## 2022-08-15 DIAGNOSIS — R1310 Dysphagia, unspecified: Secondary | ICD-10-CM

## 2022-08-15 DIAGNOSIS — R152 Fecal urgency: Secondary | ICD-10-CM

## 2022-08-15 LAB — CBC
HCT: 41.3 % (ref 36.0–46.0)
Hemoglobin: 13.6 g/dL (ref 12.0–15.0)
MCHC: 32.8 g/dL (ref 30.0–36.0)
MCV: 82.3 fl (ref 78.0–100.0)
Platelets: 363 10*3/uL (ref 150.0–400.0)
RBC: 5.02 Mil/uL (ref 3.87–5.11)
RDW: 16 % — ABNORMAL HIGH (ref 11.5–15.5)
WBC: 8.3 10*3/uL (ref 4.0–10.5)

## 2022-08-15 LAB — LIPASE: Lipase: 52 U/L (ref 11.0–59.0)

## 2022-08-15 LAB — BASIC METABOLIC PANEL
BUN: 15 mg/dL (ref 6–23)
CO2: 31 mEq/L (ref 19–32)
Calcium: 9.7 mg/dL (ref 8.4–10.5)
Chloride: 100 mEq/L (ref 96–112)
Creatinine, Ser: 0.98 mg/dL (ref 0.40–1.20)
GFR: 64.6 mL/min (ref >60.00)
Glucose, Bld: 103 mg/dL — ABNORMAL HIGH (ref 70–99)
Potassium: 3.8 mEq/L (ref 3.5–5.1)
Sodium: 140 mEq/L (ref 135–145)

## 2022-08-15 LAB — AMYLASE: Amylase: 55 U/L (ref 27–131)

## 2022-08-15 MED ORDER — NA SULFATE-K SULFATE-MG SULF 17.5-3.13-1.6 GM/177ML PO SOLN
1.0000 | ORAL | 0 refills | Status: DC
Start: 2022-08-15 — End: 2022-09-27

## 2022-08-15 MED ORDER — SUCRALFATE 1 G PO TABS
1.0000 g | ORAL_TABLET | Freq: Three times a day (TID) | ORAL | 0 refills | Status: DC
Start: 2022-08-15 — End: 2022-08-23

## 2022-08-15 NOTE — Patient Instructions (Signed)
You have been scheduled for an abdominal ultrasound at [facility] (1st floor of hospital) on 08/18/22  at 11:00 am . Please arrive 30 minutes prior to your appointment for registration. Make certain not to have anything to eat or drink 6 hours prior to your appointment. Should you need to reschedule your appointment, please contact radiology at [phone]. This test typically takes about 30 minutes to perform.  Your provider has requested that you go to the basement level for lab work before leaving today. Press "B" on the elevator. The lab is located at the first door on the left as you exit the elevator.   We have sent the following medications to your pharmacy for you to pick up at your convenience: Suprep and Carafate   You have been scheduled for an endoscopy and colonoscopy. Please follow the written instructions given to you at your visit today. Please pick up your prep supplies at the pharmacy within the next 1-3 days. If you use inhalers (even only as needed), please bring them with you on the day of your procedure.  _______________________________________________________  If your blood pressure at your visit was 140/90 or greater, please contact your primary care physician to follow up on this.  _______________________________________________________  If you are age 57 or older, your body mass index should be between 23-30. Your Body mass index is 58.16 kg/m. If this is out of the aforementioned range listed, please consider follow up with your Primary Care Provider.  If you are age 84 or younger, your body mass index should be between 19-25. Your Body mass index is 58.16 kg/m. If this is out of the aformentioned range listed, please consider follow up with your Primary Care Provider.   ________________________________________________________  The [facility] providers would like to encourage you to use [facility] to communicate with providers for non-urgent requests or  questions.  Due to long hold times on the telephone, sending your provider a message by [facility] may be a faster and more efficient way to get a response.  Please allow 48 business hours for a response.  Please remember that this is for non-urgent requests.  _______________________________________________________  Thank you for choosing me and [facility].  Dr. Meridee Score

## 2022-08-16 LAB — TSH: TSH: 1.66 u[IU]/mL (ref 0.35–5.50)

## 2022-08-17 ENCOUNTER — Telehealth: Payer: Self-pay | Admitting: Pulmonary Disease

## 2022-08-17 DIAGNOSIS — G4733 Obstructive sleep apnea (adult) (pediatric): Secondary | ICD-10-CM

## 2022-08-17 NOTE — Telephone Encounter (Signed)
PT calling because her CPAP machine is indicating motor has reached end of life. [employer] is her supplier. RX needs to read:  S11-Resned (This is the modle they recommended at [facility])   CPAP warning light states engine life has expired  Please call PT to advise of action taken.  Fax # to [employer] is [phone]

## 2022-08-17 NOTE — Telephone Encounter (Signed)
Called and spoke with pt who states she has had a warning light that has come on her machine stating that the motor light has expired. Pt said she spoke with [facility] and they told her that it looked like her prior machine was received in 2019.  Dr. Vassie Loll, please advise if you are okay with an order being placed for pt to receive a new machine from [place].

## 2022-08-18 ENCOUNTER — Ambulatory Visit (HOSPITAL_COMMUNITY): Payer: BC Managed Care – PPO

## 2022-08-19 ENCOUNTER — Encounter: Payer: Self-pay | Admitting: Gastroenterology

## 2022-08-19 DIAGNOSIS — R152 Fecal urgency: Secondary | ICD-10-CM | POA: Insufficient documentation

## 2022-08-19 DIAGNOSIS — R194 Change in bowel habit: Secondary | ICD-10-CM | POA: Insufficient documentation

## 2022-08-19 NOTE — Progress Notes (Signed)
GASTROENTEROLOGY OUTPATIENT CLINIC VISIT   Primary Care Provider [name], [name], NP [address] [place] [address] [phone]  Patient Profile: Madison Palmer is a 57 y.o. female with a pmh significant for morbid obesity (s/p prior Lap-Band and now removed), GERD, MDD, HLD, HTN, Colon Polyps (TAs & Hyperplastic), Thyroid Disease, Diverticulosis, diabetes, PCOS, reported incomplete LES relaxation (not consistent with EGJ outflow obstruction as not [place] to classification), Pulmonary sarcoidosis, CHFpEF (2/2 Diastolic Dysfunction).  The patient presents to the [facility] for an evaluation and management of problem(s) noted below:  Problem List 1. Gastroesophageal reflux disease, unspecified whether esophagitis present   2. Dysphagia, unspecified type   3. Hx of adenomatous colonic polyps   4. Fecal urgency   5. Change in bowel habits     History of Present Illness: Please see prior notes for full details of HPI.  Interval History The patient returns for follow-up.  I have not seen her since last year.  Patient has had some intentional weight loss although she is not as happy as she would want to be she is thankful that she is making some strides.  However in March she had a "gastritis attack".  She is currently taking PPI and at the time of her episode she was prescribed Carafate and took that for a week and then stopped.  She is still having upper abdominal discomfort.  She will have infrequent dysphagia symptoms.  Her bowel habits have changed as well.  She will have constipation and then 3 to 4 days later finally have liquid loose bowel movements.  She is noted increasing fecal urgency as well.  She had been recommended some stool studies but was not able to complete that since she has quick urgency.  She is hopeful that she can feel better.  GI Review of Systems Positive as above Negative for odynophagia, melena, hematochezia  Review of  Systems General: Denies fevers/chills Cardiovascular: Denies chest pain/palpitations Pulmonary: SOB is stable Gastroenterological: See HPI Genitourinary: Denies darkened urine Dermatological: No jaundice  Psychological: Mood is stable   Medications Current Outpatient Medications  Medication Sig Dispense Refill   acetaminophen (TYLENOL) 500 MG tablet Take 1,000 mg by mouth 3 (three) times daily as needed for moderate pain or headache.     amLODipine (NORVASC) 10 MG tablet TAKE 1 TABLET BY MOUTH EVERY DAY 90 tablet 3   atorvastatin (LIPITOR) 20 MG tablet TAKE 1 TABLET(20 MG) BY MOUTH 3 TIMES A WEEK 12 tablet 5   benztropine (COGENTIN) 0.5 MG tablet Take 1 tablet (0.5 mg total) by mouth at bedtime. 30 tablet 2   cetirizine (ZYRTEC) 10 MG tablet TAKE 1 TABLET BY MOUTH EVERY DAY 90 tablet 1   Cholecalciferol (VITAMIN D3 MAXIMUM STRENGTH) 125 MCG (5000 UT) capsule Take 5,000 Units by mouth daily.     cinacalcet (SENSIPAR) 30 MG tablet Take 30 mg by mouth daily.     cloNIDine (CATAPRES) 0.1 MG tablet Take 0.1 mg by mouth 3 (three) times daily.     esomeprazole (NEXIUM) 40 MG capsule TAKE 1 CAPSULE BY MOUTH TWICE DAILY 180 capsule 2   fenofibrate (TRICOR) 145 MG tablet TAKE 1 TABLET(145 MG) BY MOUTH DAILY 90 tablet 3   furosemide (LASIX) 40 MG tablet TAKE 1 TABLET BY MOUTH EVERY DAY 90 tablet 3   gabapentin (NEURONTIN) 100 MG capsule TAKE 2 CAPSULES(200 MG) BY MOUTH AT BEDTIME 180 capsule 3   labetalol (NORMODYNE) 300 MG tablet TAKE 1.5 TABLETS BY  MOUTH TWICE DAILY 270 tablet 3   levothyroxine (SYNTHROID) 88 MCG tablet Take 88 mcg by mouth daily.     lisinopril (ZESTRIL) 40 MG tablet TAKE 1 TABLET BY MOUTH EVERY DAY 90 tablet 1   lurasidone (LATUDA) 40 MG TABS tablet Take 1 tablet (40 mg total) by mouth daily with breakfast. 30 tablet 2   metFORMIN (GLUCOPHAGE) 500 MG tablet Take 1 tablet (500 mg total) by mouth 2 (two) times daily with a meal. 180 tablet 0   Na Sulfate-K Sulfate-Mg Sulf  (SUPREP BOWEL PREP KIT) 17.5-3.13-1.6 GM/177ML SOLN Take 1 kit by mouth as directed. For colonoscopy prep 354 mL 0   Potassium Chloride ER 20 MEQ TBCR TAKE 1 TABLET BY MOUTH EVERY DAY 90 tablet 2   TART CHERRY PO Take by mouth daily.     Polyethylene Glycol 3350 (MIRALAX PO) Take by mouth. (Patient not taking: Reported on 08/15/2022)     sucralfate (CARAFATE) 1 g tablet Take 1 tablet (1 g total) by mouth 3 (three) times daily before meals. 28 tablet 0   No current facility-administered medications for this visit.    Allergies Allergies  Allergen Reactions   Other     Other reaction(s): Other (See Comments) Instructed not to take by Cardiology.   Fentanyl Hives    Other reaction(s): hives had fentanyl recently with benadryl, did OK   Levofloxacin Hives   Midazolam Hives   Pollen Extract     seasonal   Atorvastatin     Muscle pain in legs  Other reaction(s): LEG PAIN   Hydralazine Hcl Other (See Comments)    Hypercalcemia    Rosuvastatin     Abdominal pain Other reaction(s): diarrhea    Histories Past Medical History:  Diagnosis Date   Acute on chronic respiratory failure with hypoxia and hypercapnia 09/15/2018   Anemia    Anxiety    Arrhythmia    tachycardia   Arthritis    Bipolar 1 disorder 09/12/2018   Bipolar I disorder, current or most recent episode manic, with psychotic features    Brief psychotic disorder 08/22/2018   Chronic respiratory failure with hypoxia 05/10/2018   Formatting of this note might be different from the original. Last Assessment & Plan:  Continue 1 L continuous on exertion and during sleep. On her next visit we will check OSA on CPAP/room air to decide whether she needs to take oxygen along with her on her cruise in May   Depression    Diverticulitis    Generalized edema 03/12/2018   GERD (gastroesophageal reflux disease)    Glaucoma    Hyperlipidemia    Hyperparathyroidism    Hypertension    Inflammatory polyps of colon    Leg swelling  01/14/2019   LVH (left ventricular hypertrophy)    Lymphedema    Morbid (severe) obesity due to excess calories 05/11/2017   PCOS (polycystic ovarian syndrome)    Prediabetes    Recurrent UTI    Sleep apnea    CPAP   Thyroid disease    Vitamin D deficiency    Past Surgical History:  Procedure Laterality Date   BREAST BIOPSY  2015   CESAREAN SECTION  2004   CHOLECYSTECTOMY     INNER EAR SURGERY     ear and sinus surgery   LAPAROSCOPIC REPAIR AND REMOVAL OF GASTRIC BAND     OOPHORECTOMY Left    TONSILLECTOMY AND ADENOIDECTOMY     Social History   Socioeconomic History   Marital status:  Married    Spouse name: Not on file   Number of children: 1   Years of education: Not on file   Highest education level: Not on file  Occupational History   Occupation: Unemployed  Tobacco Use   Smoking status: Never   Smokeless tobacco: Never  Vaping Use   Vaping Use: Never used  Substance and Sexual Activity   Alcohol use: Yes    Comment: social   Drug use: No   Sexual activity: Not Currently  Other Topics Concern   Not on file  Social History Narrative   Lives with son    Currently separated from spouse   Left handed   Caffeine: coffee daily: half-caff 3 cups/day and decaf maybe 3-5 cups/day, occasional mtn dew, tea 1-2 times per week   Social Determinants of Health   Financial Resource Strain: Low Risk  (07/03/2022)   Overall Financial Resource Strain (CARDIA)    Difficulty of Paying Living Expenses: Not hard at all  Food Insecurity: No Food Insecurity (07/03/2022)   Hunger Vital Sign    Worried About Running Out of Food in the Last Year: Never true    Ran Out of Food in the Last Year: Never true  Transportation Needs: No Transportation Needs (07/03/2022)   PRAPARE - Administrator, Civil Service (Medical): No    Lack of Transportation (Non-Medical): No  Physical Activity: Inactive (07/03/2022)   Exercise Vital Sign    Days of Exercise per Week: 0 days    Minutes  of Exercise per Session: 0 min  Stress: No Stress Concern Present (07/03/2022)   [employer] of Occupational Health - Occupational Stress Questionnaire    Feeling of Stress : Not at all  Social Connections: Not on file  Intimate Partner Violence: Not on file   Family History  Adopted: Yes  Problem Relation Age of Onset   Hearing loss Son        right    Healthy Son    Breast cancer Neg Hx    I have reviewed her medical, social, and family history in detail and updated the electronic medical record as necessary.    PHYSICAL EXAMINATION  BP (!) 140/78   Pulse 80   Ht  (1.575 m) Comment: height measured without shoes  Wt (!) 318 lb (144.2 kg)   LMP 05/02/2017   BMI 58.16 kg/m  GEN: NAD, appears stated age, doesn't appear chronically ill PSYCH: Cooperative, without pressured speech EYE: Conjunctivae pink, sclerae anicteric ENT: MMM CV: Nontachycardic RESP: No audible wheezing GI: NABS, soft, obese, rounded, ND MSK/EXT: Bilateral pedal edema present SKIN: No jaundice NEURO:  Alert & Oriented x 3, no focal deficits   REVIEW OF DATA  I reviewed the following data at the time of this encounter:  GI Procedures and Studies  No new studies to review  Laboratory Studies  Reviewed in epic  Imaging Studies  No relevant studies   ASSESSMENT  Ms. Gingras is a 57 y.o. female with a pmh significant for morbid obesity (s/p prior Lap-Band and now removed), GERD, MDD, HLD, HTN, Colon Polyps (TAs & Hyperplastic), Thyroid Disease, Diverticulosis, diabetes, PCOS, reported incomplete LES relaxation (not consistent with EGJ outflow obstruction as not [place] to classification), Pulmonary sarcoidosis, CHFpEF (2/2 Diastolic Dysfunction).  The patient is seen today for evaluation and management of:  1. Gastroesophageal reflux disease, unspecified whether esophagitis present   2. Dysphagia, unspecified type   3. Hx of adenomatous colonic polyps  4. Fecal urgency   5. Change in  bowel habits    The patient is hemodynamically stable.  Clinically however she is experiencing increasing GI symptoms that will require additional workup.  We are going to pursue an upper endoscopy as well as a diagnostic colonoscopy for her symptoms.  We want to rule out inflammatory bowel disease or significant proctitis as a result of what is causing her fecal urgency.  We need to find a better understanding her "gastritis" symptoms.  We are going to get an abdominal ultrasound at a minimum and consider cross-sectional imaging as well.  To further evaluate her symptoms she will also get laboratories.  I am okay with her restarting Carafate for now.  The risks and benefits of endoscopic evaluation were discussed with the patient; these include but are not limited to the risk of perforation, infection, bleeding, missed lesions, lack of diagnosis, severe illness requiring hospitalization, as well as anesthesia and sedation related illnesses.  The patient and/or family is agreeable to proceed.  All patient questions were answered to the best of my ability, and the patient agrees to the aforementioned plan of action with follow-up as indicated.   PLAN  Continue Nexium 40 mg twice daily Restart Carafate 2-4 times daily EGD for evaluation of upper GI symptoms and dysphagia and GERD Colonoscopy with biopsies to evaluate change in bowel habits and fecal urgency Laboratories as outlined below Fecal elastase to be obtained Consider SIBO breath testing in future Abdominal ultrasound to be performed Consider cross-sectional imaging if negative workup above   Orders Placed This Encounter  Procedures   Procedural/ Surgical Case Request: COLONOSCOPY WITH PROPOFOL, ESOPHAGOGASTRODUODENOSCOPY (EGD) WITH PROPOFOL   US Abdomen Complete   CBC   Basic Metabolic Panel (BMET)   Amylase   Lipase   TSH   Pancreatic elastase, fecal   Ambulatory referral to Gastroenterology     Modified Medications   Modified  Medication Previous Medication   SUCRALFATE (CARAFATE) 1 G TABLET sucralfate (CARAFATE) 1 g tablet      Take 1 tablet (1 g total) by mouth 3 (three) times daily before meals.    Take 1 tablet (1 g total) by mouth 3 (three) times daily before meals.    Planned Follow Up: No follow-ups on file.   Total Time in Face-to-Face and in Coordination of Care for patient including independent/personal interpretation/review of prior testing, medical history, examination, medication adjustment, communicating results with the patient directly, and documentation within the EHR is 25 minutes.   Corliss Parish, MD [facility] Advanced Endoscopy Office # [phone]

## 2022-08-21 ENCOUNTER — Ambulatory Visit (HOSPITAL_COMMUNITY)
Admission: RE | Admit: 2022-08-21 | Discharge: 2022-08-21 | Disposition: A | Discharge status: Home / Self Care | Payer: BC Managed Care – PPO | Source: Ambulatory Visit | Attending: Pulmonary Disease | Admitting: Pulmonary Disease

## 2022-08-21 ENCOUNTER — Other Ambulatory Visit (HOSPITAL_COMMUNITY): Payer: BC Managed Care – PPO

## 2022-08-21 DIAGNOSIS — R918 Other nonspecific abnormal finding of lung field: Secondary | ICD-10-CM | POA: Diagnosis present

## 2022-08-21 LAB — GLUCOSE, CAPILLARY: Glucose-Capillary: 103 mg/dL — ABNORMAL HIGH (ref 70–99)

## 2022-08-21 MED ORDER — FLUDEOXYGLUCOSE F - 18 (FDG) INJECTION
13.0000 | Freq: Once | INTRAVENOUS | Status: AC | PRN
  Administered 2022-08-21: 15.91 via INTRAVENOUS

## 2022-08-21 NOTE — Telephone Encounter (Signed)
Order has been placed for new cpap machine. Nothing further needed.

## 2022-08-23 ENCOUNTER — Other Ambulatory Visit: Payer: Self-pay | Admitting: Gastroenterology

## 2022-08-23 DIAGNOSIS — Z86010 Personal history of colonic polyps: Secondary | ICD-10-CM

## 2022-08-23 DIAGNOSIS — R194 Change in bowel habit: Secondary | ICD-10-CM

## 2022-08-23 DIAGNOSIS — K219 Gastro-esophageal reflux disease without esophagitis: Secondary | ICD-10-CM

## 2022-08-23 DIAGNOSIS — R1310 Dysphagia, unspecified: Secondary | ICD-10-CM

## 2022-08-23 DIAGNOSIS — R152 Fecal urgency: Secondary | ICD-10-CM

## 2022-08-24 ENCOUNTER — Ambulatory Visit (HOSPITAL_COMMUNITY)
Admission: RE | Admit: 2022-08-24 | Discharge: 2022-08-24 | Disposition: A | Discharge status: Home / Self Care | Payer: BC Managed Care – PPO | Source: Ambulatory Visit | Attending: Gastroenterology | Admitting: Gastroenterology

## 2022-08-24 DIAGNOSIS — R194 Change in bowel habit: Secondary | ICD-10-CM | POA: Diagnosis present

## 2022-08-24 DIAGNOSIS — K219 Gastro-esophageal reflux disease without esophagitis: Secondary | ICD-10-CM | POA: Insufficient documentation

## 2022-08-24 DIAGNOSIS — R1310 Dysphagia, unspecified: Secondary | ICD-10-CM | POA: Diagnosis present

## 2022-08-24 DIAGNOSIS — R152 Fecal urgency: Secondary | ICD-10-CM | POA: Diagnosis present

## 2022-08-24 DIAGNOSIS — Z86010 Personal history of colonic polyps: Secondary | ICD-10-CM | POA: Insufficient documentation

## 2022-08-28 ENCOUNTER — Encounter (HOSPITAL_BASED_OUTPATIENT_CLINIC_OR_DEPARTMENT_OTHER): Payer: Self-pay | Admitting: Pulmonary Disease

## 2022-08-28 ENCOUNTER — Ambulatory Visit (INDEPENDENT_AMBULATORY_CARE_PROVIDER_SITE_OTHER): Payer: BC Managed Care – PPO | Admitting: Pulmonary Disease

## 2022-08-28 ENCOUNTER — Ambulatory Visit (INDEPENDENT_AMBULATORY_CARE_PROVIDER_SITE_OTHER): Payer: BC Managed Care – PPO | Admitting: Family Medicine

## 2022-08-28 VITALS — BP 124/70 | HR 96 | Temp 98.1°F | Ht 62.0 in | Wt 316.4 lb

## 2022-08-28 DIAGNOSIS — R918 Other nonspecific abnormal finding of lung field: Secondary | ICD-10-CM | POA: Diagnosis not present

## 2022-08-28 DIAGNOSIS — G4733 Obstructive sleep apnea (adult) (pediatric): Secondary | ICD-10-CM | POA: Diagnosis not present

## 2022-08-28 DIAGNOSIS — K769 Liver disease, unspecified: Secondary | ICD-10-CM

## 2022-08-28 NOTE — Assessment & Plan Note (Signed)
Pulmonary nodules have increased in size and new mediastinal lymphadenopathy is emerged.  This would be suspicious for lung primary as discussed above or worsening sarcoidosis. She would be high risk for general anesthesia given comorbidities and history of prior laryngospasm hence would favor liver biopsy even from a risk standpoint.

## 2022-08-28 NOTE — Assessment & Plan Note (Signed)
Very compliant with CPAP

## 2022-08-28 NOTE — Assessment & Plan Note (Addendum)
Liver lesions are new together with mediastinal lymphadenopathy and increasing size of pulmonary nodules, all hypermetabolic on PET.  This is very suspicious for malignancy.  Differential includes sarcoidosis especially given that pulmonary nodules were stable for a period of 2 years.  Biopsy would be necessary and would favor liver biopsy as against lung biopsy .  Possibility of colon Cancer primary would not explain lung lesions and would have to consider second diagnosis of sarcoidosis.  Possibility of lung primary could possibly explain liver metastases.  Sarcoidosis would be unlikely to go only to the liver but possible. We will schedule for ultrasound-guided liver biopsy especially given that Madison Palmer would be high risk for general anesthesia Risks and benefits of the procedure were discussed in detail

## 2022-08-28 NOTE — Progress Notes (Signed)
   Subjective:    Patient ID: Madison Palmer, female    DOB: [birth year], 57 y.o.   MRN: [id number]  HPI  57 yo morbidly obese never smoker with chronic lymphedema  for FU  of severe obstructive sleep apnea & pulm nodules. 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  lap band was removed in 2013.    PMH - laryngospasm with prior surgery Chronic diastolic heart failure  cardiac MRI was negative for amyloidosis, showed multiple pulmonary nodules  Previously pulmonary nodules were noted to be stable from 2020- 2022 and low-grade hypermetabolic on PET.  We obtain 2-year follow-up CT chest which showed increase in size of the nodules bilaterally and new mediastinal lymphadenopathy.  PET showed hypermetabolism in lymphadenopathy and nodules and also new liver lesions She had vague GI symptoms of dyspepsia and dysphagia, has seen GI, ultrasound abdomen was obtained which showed liver lesions We reviewed all these images and findings today She had understandably anxiety around these findings.  Her husband was on the phone and asked a lot of questions  She has lost about 10 pounds over the last 2 years  Significant tests/ events reviewed    12/2018 ONO showed SpO2 low 85%, baseline 91%. 8 mins <88% >>  continue O2 blended into CPAP   PFTs severe restriction due to obesity 01/2020-FVC 1.15 (34%), FEV1 0.88 (33%), ratio 76, DLCOco 162%  CT chest 06/2022 increased size of nodules compared to 2022, new mediastinal lymphadenopathy   CT chest 07/2020 multiple pulmonary nodules, all stable CT chest 01/2020  stable pulmonary nodules, favoring benign cause CT angiogram 05/10/18  was negative for pulmonary emboli, but showed numerous pulmonary nodules largest 15 mm in the right lower lobe and 13 mm in the lingula. Mosaic attenuation was also noted  suggesting air trapping       PET 07/2022 hypermetabolic pulmonary nodules, mediastinal adenopathy and liver lesions PET 05/24/18  low-grade hypermetabolism SUV 3-4 range in the nodules.  Mediastinum did not show any hypermetabolic     prior CT chest from 2010 and 2011  >> shows multiple noncalcified pulmonary nodules 3 to 6 mm, largest being 6 mm    NP SG 2008 showed AHI of 100/hour which was corrected by CPAP of 14 cm.      rheumatology evaluation >> positive ANA and positive RA factor but negative CCP and no evidence of synovitis , ENA neg ACE 40  Review of Systems neg for any significant sore throat, dysphagia, itching, sneezing, nasal congestion or excess/ purulent secretions, fever, chills, sweats, unintended wt loss, pleuritic or exertional cp, hempoptysis, orthopnea pnd or change in chronic leg swelling. Also denies presyncope, palpitations, heartburn, abdominal pain, nausea, vomiting, diarrhea or change in bowel or urinary habits, dysuria,hematuria, rash, arthralgias, visual complaints, headache, numbness weakness or ataxia.     Objective:   Physical Exam  Gen. Pleasant, obese, in no distress ENT - no lesions, no post nasal drip Neck: No JVD, no thyromegaly, no carotid bruits Lungs: no use of accessory muscles, no dullness to percussion, decreased without rales or rhonchi  Cardiovascular: Rhythm regular, heart sounds  normal, no murmurs or gallops, no peripheral edema Musculoskeletal: No deformities, no cyanosis or clubbing , no tremors       Assessment & Plan:

## 2022-08-28 NOTE — Patient Instructions (Signed)
X schedule liver biopsy We discussed risks & benefits  We discussed CT & PET scan

## 2022-08-29 ENCOUNTER — Other Ambulatory Visit: Payer: Self-pay

## 2022-08-29 DIAGNOSIS — K769 Liver disease, unspecified: Secondary | ICD-10-CM

## 2022-08-31 ENCOUNTER — Telehealth: Payer: Self-pay | Admitting: Pulmonary Disease

## 2022-08-31 NOTE — Telephone Encounter (Signed)
Spoke with patient. Advised order for Liver Biopsy has been placed and someone should contact her within the next week to get her scheduled. She verbalized understanding. NFN

## 2022-08-31 NOTE — Telephone Encounter (Signed)
Pt calling. Was liver Biopsy ordered? Please call PT to advise @ [phone]

## 2022-09-05 ENCOUNTER — Ambulatory Visit (INDEPENDENT_AMBULATORY_CARE_PROVIDER_SITE_OTHER): Payer: BC Managed Care – PPO | Admitting: Family Medicine

## 2022-09-05 ENCOUNTER — Encounter (INDEPENDENT_AMBULATORY_CARE_PROVIDER_SITE_OTHER): Payer: Self-pay | Admitting: Family Medicine

## 2022-09-05 VITALS — BP 138/82 | HR 79 | Temp 98.6°F | Ht 62.0 in | Wt 310.0 lb

## 2022-09-05 DIAGNOSIS — Z6841 Body Mass Index (BMI) 40.0 and over, adult: Secondary | ICD-10-CM

## 2022-09-05 DIAGNOSIS — K769 Liver disease, unspecified: Secondary | ICD-10-CM | POA: Diagnosis not present

## 2022-09-05 DIAGNOSIS — E669 Obesity, unspecified: Secondary | ICD-10-CM

## 2022-09-05 NOTE — Progress Notes (Signed)
Chief Complaint:   OBESITY Madison Palmer is here to discuss her progress with her obesity treatment plan along with follow-up of her obesity related diagnoses. [name] is on keeping a food journal and adhering to recommended goals of 1600-1700 calories and 100+ grams of protein and states she is following her eating plan approximately 92% of the time. [name] states she is doing 0 minutes 0 times per week.  Today's visit was #: 60 Starting weight: 357 lbs Starting date: 02/27/2018 Today's weight: 310 lbs Today's date: 09/05/2022 Total lbs lost to date: 47 Total lbs lost since last in-office visit: 10  Interim History: Patient has had abnormalities on PET scan and [place] of abdomen showing enlarge carinal lymph nodes and a lung mass and hepatic mass.  She mentions that she saw Dr. Vassie Loll and was referred for IR guided biopsy of the liver mass.  She mentions today that she has been not thinking much about possible diagnosis.  She has been losing more weight recently which is concerning for possible oncologic etiology.  She has a few trips planned but is going to see what is happening with the pathology of her biopsy prior to committing to those trips.   Subjective:   1. 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  Reexamined on ultrasound.  Given size, metastic disease favored.  Assessment/Plan:   1. Liver lesion Liver biopsy is not scheduled yet.  Patient is to call pulmonary office tomorrow for a follow-up on scheduling and what the hold-up is.  We discussed at length support as well as possible diagnosis which includes malignancy.  Patient is prepared to seek treatment regardless of biopsy outcome.  2. BMI 50.0-59.9, adult (HCC)  3. Obesity with starting BMI of 61.2 [name] is currently in the action stage of change. As such, her goal is to continue with weight loss efforts. She has agreed to keeping a food journal and adhering to recommended goals of  1600-1700 calories and 100+ grams of protein daily.   Exercise goals: No exercise has been prescribed at this time.  Behavioral modification strategies: increasing lean protein intake, meal planning and cooking strategies, keeping healthy foods in the home, and planning for success.  [name] has agreed to follow-up with our clinic in 4 weeks. She was informed of the importance of frequent follow-up visits to maximize her success with intensive lifestyle modifications for her multiple health conditions.   Objective:   Blood pressure 138/82, pulse 79, temperature 98.6 F (37 C), height 5\' 2"  (1.575 m), weight (!) 310 lb (140.6 kg), last menstrual period 05/02/2017, SpO2 94 %. Body mass index is 56.7 kg/m.  General: Cooperative, alert, well developed, in no acute distress. HEENT: Conjunctivae and lids unremarkable. Cardiovascular: Regular rhythm.  Lungs: Normal work of breathing. Neurologic: No focal deficits.   Lab Results  Component Value Date   CREATININE 0.98 08/15/2022   BUN 15 08/15/2022   NA 140 08/15/2022   K 3.8 08/15/2022   CL 100 08/15/2022   CO2 31 08/15/2022   Lab Results  Component Value Date   ALT 19 07/13/2022   AST 18 07/13/2022   ALKPHOS 74 07/13/2022   BILITOT 0.7 07/13/2022   Lab Results  Component Value Date   HGBA1C 6.3 04/18/2022   HGBA1C 5.6 04/12/2021   HGBA1C 5.6 06/01/2020   HGBA1C 5.7 (H) 12/11/2019   HGBA1C 5.7 (H) 12/24/2018   Lab Results  Component Value Date   INSULIN 18.9 06/01/2020  INSULIN 11.4 12/11/2019   INSULIN 21.1 12/30/2018   INSULIN 9.6 02/27/2018   Lab Results  Component Value Date   TSH 1.66 08/15/2022   Lab Results  Component Value Date   CHOL 173 10/28/2021   HDL 48 10/28/2021   LDLCALC 102 (H) 10/28/2021   LDLDIRECT 94.0 04/18/2022   TRIG 132 10/28/2021   CHOLHDL 3.6 10/28/2021   Lab Results  Component Value Date   VD25OH 34.4 09/08/2021   VD25OH 29.0 (L) 09/01/2020   VD25OH 19.7 (L) 06/01/2020   Lab  Results  Component Value Date   WBC 8.3 08/15/2022   HGB 13.6 08/15/2022   HCT 41.3 08/15/2022   MCV 82.3 08/15/2022   PLT 363.0 08/15/2022   Lab Results  Component Value Date   IRON 44 02/20/2018   TIBC 347 05/29/2017   Attestation Statements:   Reviewed by clinician on day of visit: allergies, medications, problem list, medical history, surgical history, family history, social history, and previous encounter notes.  Time spent on visit including pre-visit chart review and post-visit care and charting was 30 minutes.   I, Burt Knack, am acting as transcriptionist for Reuben Likes, MD.  I have reviewed the above documentation for accuracy and completeness, and I agree with the above. - Reuben Likes, MD

## 2022-09-06 ENCOUNTER — Telehealth: Payer: Self-pay | Admitting: Pulmonary Disease

## 2022-09-06 NOTE — Telephone Encounter (Signed)
Madison Palmer - will you please follow up on this [facility] pt?

## 2022-09-06 NOTE — Telephone Encounter (Signed)
Left message for IR

## 2022-09-06 NOTE — Telephone Encounter (Signed)
Patient would like to schedule liver biopsy. Patient phone number is [phone].

## 2022-09-07 ENCOUNTER — Ambulatory Visit (HOSPITAL_BASED_OUTPATIENT_CLINIC_OR_DEPARTMENT_OTHER): Payer: BC Managed Care – PPO | Admitting: Pulmonary Disease

## 2022-09-07 NOTE — Progress Notes (Signed)
Irish Lack, MD  Leodis Rains D PROCEDURE / BIOPSY REVIEW Date: 09/07/22  Requested Biopsy site: Liver Reason for request: Liver masses Imaging review: Best seen on [place] and PET  Decision: Approved Imaging modality to perform: Ultrasound Schedule with: Moderate Sedation Schedule for: Any VIR  Additional comments: @Schedulers .  Please contact me with questions, concerns, or if issue pertaining to this request arise.  Reola Calkins, MD [facility]

## 2022-09-08 ENCOUNTER — Telehealth: Payer: Self-pay | Admitting: Gastroenterology

## 2022-09-08 NOTE — Telephone Encounter (Signed)
Left message on machine to call back  

## 2022-09-08 NOTE — Telephone Encounter (Signed)
Inbound call from patient , she is looking to cancel appt for 5/23 at the [facility] stated she is schedule to have a Biopsy on 5/22 and can not make to the procedure.Please advise

## 2022-09-11 NOTE — Telephone Encounter (Signed)
Left message on machine to call back  

## 2022-09-11 NOTE — Telephone Encounter (Signed)
Madison Palmer, I called and spoke with the patient this afternoon.  Please offer her the next time on May 29 (my hospital week) to do this patient's EGD/colonoscopy. Go ahead and open up the slot on the 23rd so that we have the opportunity to add another patient on that particular date. This would follow my other case already scheduled on the 29th. Please confirm this and let the patient know. Thanks. GM

## 2022-09-11 NOTE — Telephone Encounter (Signed)
I spoke with the pt and made her aware of the next available date for procedure in July.  She really wants to keep the appt as planned. Her liver biopsy is 5/22 and colon is 5/23. She prefers to wait and see how she feels after her liver biopsy and call back if she does not think she can keep the 5/23 colon. She was told by the scheduler for liver biopsy she can keep appt as long as she feels ok.  Dr Meridee Score are you ok with her keeping appt as long as she feels well?

## 2022-09-12 NOTE — Telephone Encounter (Signed)
Appt has been moved to 09/27/22 at [facility] at 845 am  The pt has been advised New instructions sent to the pt via My chart (she does view those messages)

## 2022-09-17 ENCOUNTER — Other Ambulatory Visit: Payer: Self-pay | Admitting: Nurse Practitioner

## 2022-09-17 DIAGNOSIS — E782 Mixed hyperlipidemia: Secondary | ICD-10-CM

## 2022-09-18 ENCOUNTER — Other Ambulatory Visit: Payer: Self-pay

## 2022-09-19 ENCOUNTER — Other Ambulatory Visit: Payer: Self-pay | Admitting: Radiology

## 2022-09-19 DIAGNOSIS — K769 Liver disease, unspecified: Secondary | ICD-10-CM

## 2022-09-20 ENCOUNTER — Ambulatory Visit (HOSPITAL_COMMUNITY)
Admission: RE | Admit: 2022-09-20 | Discharge: 2022-09-20 | Disposition: A | Discharge status: Home / Self Care | Payer: BC Managed Care – PPO | Source: Ambulatory Visit | Attending: Pulmonary Disease | Admitting: Pulmonary Disease

## 2022-09-20 ENCOUNTER — Encounter (HOSPITAL_COMMUNITY): Payer: Self-pay

## 2022-09-20 ENCOUNTER — Other Ambulatory Visit: Payer: Self-pay

## 2022-09-20 DIAGNOSIS — C787 Secondary malignant neoplasm of liver and intrahepatic bile duct: Secondary | ICD-10-CM | POA: Insufficient documentation

## 2022-09-20 DIAGNOSIS — K769 Liver disease, unspecified: Secondary | ICD-10-CM

## 2022-09-20 LAB — CBC
HCT: 40.4 % (ref 36.0–46.0)
Hemoglobin: 12.7 g/dL (ref 12.0–15.0)
MCH: 26.8 pg (ref 26.0–34.0)
MCHC: 31.4 g/dL (ref 30.0–36.0)
MCV: 85.4 fL (ref 80.0–100.0)
Platelets: 297 10*3/uL (ref 150–400)
RBC: 4.73 MIL/uL (ref 3.87–5.11)
RDW: 14.5 % (ref 11.5–15.5)
WBC: 6.1 10*3/uL (ref 4.0–10.5)
nRBC: 0 % (ref 0.0–0.2)

## 2022-09-20 LAB — PROTIME-INR
INR: 1 (ref 0.8–1.2)
Prothrombin Time: 12.9 seconds (ref 11.4–15.2)

## 2022-09-20 MED ORDER — SODIUM CHLORIDE 0.9 % IV SOLN
INTRAVENOUS | Status: AC | PRN
  Administered 2022-09-20: 10 mL/h via INTRAVENOUS

## 2022-09-20 MED ORDER — GELATIN ABSORBABLE 12-7 MM EX MISC
1.0000 | Freq: Once | CUTANEOUS | Status: AC
  Administered 2022-09-20: 1 via TOPICAL

## 2022-09-20 MED ORDER — LIDOCAINE-EPINEPHRINE 1 %-1:100000 IJ SOLN
9.0000 mL | Freq: Once | INTRAMUSCULAR | Status: AC
  Administered 2022-09-20: 9 mL via INTRADERMAL

## 2022-09-20 MED ORDER — FENTANYL CITRATE (PF) 100 MCG/2ML IJ SOLN
INTRAMUSCULAR | Status: AC
  Filled 2022-09-20: qty 2

## 2022-09-20 MED ORDER — SODIUM CHLORIDE 0.9 % IV SOLN: INTRAVENOUS | Status: DC

## 2022-09-20 MED ORDER — DIPHENHYDRAMINE HCL 50 MG/ML IJ SOLN
50.0000 mg | Freq: Once | INTRAMUSCULAR | Status: AC
  Administered 2022-09-20: 50 mg via INTRAVENOUS

## 2022-09-20 MED ORDER — MIDAZOLAM HCL 2 MG/2ML IJ SOLN
INTRAMUSCULAR | Status: AC | PRN
  Administered 2022-09-20: 1 mg via INTRAVENOUS
  Administered 2022-09-20: .5 mg via INTRAVENOUS

## 2022-09-20 MED ORDER — FENTANYL CITRATE (PF) 100 MCG/2ML IJ SOLN
INTRAMUSCULAR | Status: AC | PRN
  Administered 2022-09-20: 50 ug via INTRAVENOUS
  Administered 2022-09-20 (×2): 25 ug via INTRAVENOUS

## 2022-09-20 MED ORDER — HYDROCODONE-ACETAMINOPHEN 5-325 MG PO TABS: 1.0000 | ORAL_TABLET | ORAL | Status: DC | PRN

## 2022-09-20 MED ORDER — DIPHENHYDRAMINE HCL 50 MG/ML IJ SOLN
INTRAMUSCULAR | Status: AC
  Filled 2022-09-20: qty 1

## 2022-09-20 MED ORDER — MIDAZOLAM HCL 2 MG/2ML IJ SOLN
INTRAMUSCULAR | Status: AC
  Filled 2022-09-20: qty 2

## 2022-09-20 NOTE — Sedation Documentation (Signed)
Dr Grace Isaac aware patient having 4.5/10 pressure discomfort at biopsy site. To give Fentanyl 25 mcg IV and he will order Percocet that can be given in Short Stay if needed.

## 2022-09-20 NOTE — Sedation Documentation (Signed)
Benadryl 50mg  given at 1335 due to rash allergy to Fentanyl and Versed. Has been predosed in the past with Benadryl and no problem with allergic reaction to Fentanyl or Versed. Karrie Doffing PA and Dr Grace Isaac aware of this

## 2022-09-20 NOTE — Procedures (Signed)
Pre Procedure Dx: Live masses of uncertain etiology Post Procedural Dx: Same  Technically successful US guided biopsy of indeterminate infiltrative mass within the left lobe of the liver.  EBL: Trace No immediate complications.   Katherina Right, MD Pager #: [phone]

## 2022-09-20 NOTE — H&P (Signed)
Chief Complaint: Patient was seen in consultation today for liver lesion biopsy at the request of Alva,Rakesh V  Referring Physician(s): Oretha Milch  Supervising Physician: Gilmer Mor  Patient Status: [facility] - Out-pt  History of Present Illness: Madison Palmer is a 57 y.o. female   FULL Code Status per pt  Known pulmonary nodules followed with Dr Vassie Loll since 2000 Recent follow up and PET scan revealed abnormalities  PET 08/21/22: IMPRESSION: 1. Development of hypermetabolic thoracic adenopathy and bilobar liver lesions. Favor metastatic disease from an unknown primary. The pulmonary parenchymal and thoracic nodal findings could be seen with sarcoidosis, but hepatic sarcoid of this severity would be atypical. 2. Hypermetabolic pulmonary nodularity, present on 05/24/2018 but progressive, especially in the left lower lobe. Differential considerations include an underlying progressive infectious/inflammatory etiology versus pulmonary metastasis superimposed upon chronic infection/inflammation.  Denies pain; fever Minimal wt loss over last year Request made for liver lesion biopsy per Dr Vassie Loll   Past Medical History:  Diagnosis Date   Acute on chronic respiratory failure with hypoxia and hypercapnia (HCC) 09/15/2018   Anemia    Anxiety    Arrhythmia    tachycardia   Arthritis    Bipolar 1 disorder (HCC) 09/12/2018   Bipolar I disorder, current or most recent episode manic, with psychotic features (HCC)    Brief psychotic disorder (HCC) 08/22/2018   Chronic respiratory failure with hypoxia (HCC) 05/10/2018   Formatting of this note might be different from the original. Last Assessment & Plan:  Continue 1 L continuous on exertion and during sleep. On her next visit we will check OSA on CPAP/room air to decide whether she needs to take oxygen along with her on her cruise in May   Depression    Diverticulitis    Generalized edema 03/12/2018   GERD (gastroesophageal  reflux disease)    Glaucoma    Hyperlipidemia    Hyperparathyroidism (HCC)    Hypertension    Inflammatory polyps of colon (HCC)    Leg swelling 01/14/2019   LVH (left ventricular hypertrophy)    Lymphedema    Morbid (severe) obesity due to excess calories (HCC) 05/11/2017   PCOS (polycystic ovarian syndrome)    Prediabetes    Recurrent UTI    Sleep apnea    CPAP   Thyroid disease    Vitamin D deficiency     Past Surgical History:  Procedure Laterality Date   BREAST BIOPSY  2015   CESAREAN SECTION  2004   CHOLECYSTECTOMY     INNER EAR SURGERY     ear and sinus surgery   LAPAROSCOPIC REPAIR AND REMOVAL OF GASTRIC BAND     OOPHORECTOMY Left    TONSILLECTOMY AND ADENOIDECTOMY      Allergies: Other, Fentanyl, Levofloxacin, Midazolam, Pollen extract, Atorvastatin, Hydralazine hcl, and Rosuvastatin  Medications: Prior to Admission medications   Medication Sig Start Date End Date Taking? Authorizing Provider  acetaminophen (TYLENOL) 500 MG tablet Take 1,000 mg by mouth 3 (three) times daily as needed for moderate pain or headache.   Yes [provider]  amLODipine (NORVASC) 10 MG tablet TAKE 1 TABLET BY MOUTH EVERY DAY 04/17/22  Yes Hochrein, Fayrene Fearing, MD  atorvastatin (LIPITOR) 20 MG tablet TAKE 1 TABLET(20 MG) BY MOUTH 3 TIMES A WEEK 09/18/22  Yes [name], [name], NP  benztropine (COGENTIN) 0.5 MG tablet Take 1 tablet (0.5 mg total) by mouth at bedtime. 07/12/22 07/12/23 Yes Arfeen, Phillips Grout, MD  cetirizine (ZYRTEC) 10 MG tablet TAKE 1  TABLET BY MOUTH EVERY DAY 06/14/22  Yes [name], [name], NP  Cholecalciferol (VITAMIN D3 MAXIMUM STRENGTH) 125 MCG (5000 UT) capsule Take 5,000 Units by mouth daily.   Yes [provider]  cinacalcet (SENSIPAR) 30 MG tablet Take 30 mg by mouth daily.   Yes [provider]  cloNIDine (CATAPRES) 0.1 MG tablet Take 0.1 mg by mouth 3 (three) times daily. 05/29/22  Yes [provider]  esomeprazole (NEXIUM) 40 MG  capsule TAKE 1 CAPSULE BY MOUTH TWICE DAILY 05/02/22  Yes Mansouraty, Netty Starring., MD  fenofibrate (TRICOR) 145 MG tablet TAKE 1 TABLET(145 MG) BY MOUTH DAILY 12/12/21  Yes [name], [name], NP  furosemide (LASIX) 40 MG tablet TAKE 1 TABLET BY MOUTH EVERY DAY 04/28/22  Yes Rollene Rotunda, MD  gabapentin (NEURONTIN) 100 MG capsule TAKE 2 CAPSULES(200 MG) BY MOUTH AT BEDTIME 07/18/22  Yes [name], [name], NP  labetalol (NORMODYNE) 300 MG tablet TAKE 1.5 TABLETS BY MOUTH TWICE DAILY 03/28/22  Yes Rollene Rotunda, MD  levothyroxine (SYNTHROID) 88 MCG tablet Take 88 mcg by mouth daily. 07/28/21  Yes [provider]  lisinopril (ZESTRIL) 40 MG tablet TAKE 1 TABLET BY MOUTH EVERY DAY 06/23/22  Yes Rollene Rotunda, MD  lurasidone (LATUDA) 40 MG TABS tablet Take 1 tablet (40 mg total) by mouth daily with breakfast. 07/12/22  Yes Arfeen, Phillips Grout, MD  metFORMIN (GLUCOPHAGE) 500 MG tablet Take 1 tablet (500 mg total) by mouth 2 (two) times daily with a meal. 07/19/22  Yes Langston Reusing, MD  Potassium Chloride ER 20 MEQ TBCR TAKE 1 TABLET BY MOUTH EVERY DAY 08/09/22  Yes Rollene Rotunda, MD  TART CHERRY PO Take by mouth daily.   Yes [provider]  Na Sulfate-K Sulfate-Mg Sulf (SUPREP BOWEL PREP KIT) 17.5-3.13-1.6 GM/177ML SOLN Take 1 kit by mouth as directed. For colonoscopy prep 08/15/22   Mansouraty, Netty Starring., MD  Polyethylene Glycol 3350 (MIRALAX PO) Take by mouth.    [provider]  sucralfate (CARAFATE) 1 g tablet TAKE 1 TABLET(1 GRAM) BY MOUTH THREE TIMES DAILY BEFORE MEALS Patient not taking: Reported on 09/05/2022 08/23/22   Mansouraty, Netty Starring., MD     Family History  Adopted: Yes  Problem Relation Age of Onset   Hearing loss Son        right    Healthy Son    Breast cancer Neg Hx     Social History   Socioeconomic History   Marital status: Married    Spouse name: Not on file   Number of children: 1   Years of education: Not on file   Highest  education level: Not on file  Occupational History   Occupation: Unemployed  Tobacco Use   Smoking status: Never   Smokeless tobacco: Never  Vaping Use   Vaping Use: Never used  Substance and Sexual Activity   Alcohol use: Yes    Comment: social   Drug use: No   Sexual activity: Not Currently  Other Topics Concern   Not on file  Social History Narrative   Lives with son    Currently separated from spouse   Left handed   Caffeine: coffee daily: half-caff 3 cups/day and decaf maybe 3-5 cups/day, occasional mtn dew, tea 1-2 times per week   Social Determinants of Health   Financial Resource Strain: Low Risk  (07/03/2022)   Overall Financial Resource Strain (CARDIA)    Difficulty of Paying Living Expenses: Not hard at all  Food Insecurity:  No Food Insecurity (07/03/2022)   Hunger Vital Sign    Worried About Running Out of Food in the Last Year: Never true    Ran Out of Food in the Last Year: Never true  Transportation Needs: No Transportation Needs (07/03/2022)   PRAPARE - Administrator, Civil Service (Medical): No    Lack of Transportation (Non-Medical): No  Physical Activity: Inactive (07/03/2022)   Exercise Vital Sign    Days of Exercise per Week: 0 days    Minutes of Exercise per Session: 0 min  Stress: No Stress Concern Present (07/03/2022)   [employer] of Occupational Health - Occupational Stress Questionnaire    Feeling of Stress : Not at all  Social Connections: Not on file    Review of Systems: A 12 point ROS discussed and pertinent positives are indicated in the HPI above.  All other systems are negative.  Review of Systems  Constitutional:  Negative for activity change, fatigue and fever.  Respiratory:  Negative for cough and shortness of breath.   Cardiovascular:  Negative for chest pain.  Gastrointestinal:  Negative for abdominal pain.  Psychiatric/Behavioral:  Negative for behavioral problems and confusion.     Vital Signs: BP 136/68   Pulse  70   Temp 98.4 F (36.9 C) (Temporal)   Resp 18   Ht 5\' 2"  (1.575 m)   Wt (!) 310 lb (140.6 kg)   LMP 05/02/2017   SpO2 91%   BMI 56.70 kg/m     Physical Exam Vitals reviewed.  Constitutional:      Appearance: She is obese.  HENT:     Mouth/Throat:     Mouth: Mucous membranes are moist.  Cardiovascular:     Rate and Rhythm: Normal rate and regular rhythm.     Heart sounds: Normal heart sounds.  Pulmonary:     Effort: Pulmonary effort is normal.     Breath sounds: Normal breath sounds.  Abdominal:     Palpations: Abdomen is soft.  Musculoskeletal:        General: Normal range of motion.  Skin:    General: Skin is warm.  Neurological:     Mental Status: She is alert and oriented to person, place, and time.  Psychiatric:        Behavior: Behavior normal.     Imaging: US Abdomen Complete  Result Date: 08/24/2022 CLINICAL DATA:  GERD.  Change in bowel habits. EXAM: ABDOMEN ULTRASOUND COMPLETE COMPARISON:  None Available. FINDINGS: Gallbladder: Surgically absent Common bile duct: Diameter: 8.8 mm Liver: Numerous hypoechoic masses throughout the liver correlating with hypermetabolic masses on recent PET-CT imaging dated August 21, 2022. A representative mass measures 4.6 x 3.6 x 4.9 cm in the left hepatic lobe. Diffuse increased echogenicity. Portal vein is patent on color Doppler imaging with normal direction of blood flow towards the liver. IVC: No abnormality visualized. Pancreas: Visualized portion unremarkable. Spleen: Size and appearance within normal limits. Right Kidney: Length: 10.6 cm. Contains a 1.4 cm cyst. No follow-up imaging recommended for the cyst. Left Kidney: Length: 10.3 cm. Echogenicity within normal limits. No mass or hydronephrosis visualized. Abdominal aorta: No aneurysm visualized. Other findings: None. IMPRESSION: 1. Numerous hypoechoic masses throughout the liver correlating with hypermetabolic masses on recent PET-CT imaging. The findings are most  consistent with metastatic disease of unknown primary. 2. The common bile duct is mildly dilated at 8.8 mm, likely due to the patient's prior cholecystectomy. 3. No other abnormalities. Electronically Signed   By: Onalee Hua  Mayford Knife III M.D.   On: 08/24/2022 19:06   NM PET Image Initial (PI) Skull Base To Thigh  Result Date: 08/23/2022 CLINICAL DATA:  Initial treatment strategy for pulmonary nodules and developing mediastinal adenopathy. History of parathyroidectomy and positive rheumatoid factor. EXAM: NUCLEAR MEDICINE PET SKULL BASE TO THIGH TECHNIQUE: 15.9 mCi F-18 FDG was injected intravenously. Full-ring PET imaging was performed from the skull base to thigh after the radiotracer. CT data was obtained and used for attenuation correction and anatomic localization. Fasting blood glucose: 103 mg/dl COMPARISON:  Chest CTs 07/30/2022 and 08/13/2020. Prior PET of 05/24/2020. FINDINGS: Mild limitations secondary to patient body habitus. Mediastinal blood pool activity: SUV max 3.0 Liver activity: SUV max NA NECK: No cervical nodal hypermetabolism. Symmetric palatine tonsil activity is favored to be physiologic. Incidental CT findings: No cervical adenopathy. CHEST: Hypermetabolism corresponding to the developing thoracic adenopathy. Example subcarinal nodr which is new since the remote PET, measures 1.8 cm and a S.U.V. max of 14.8. Left-greater-than-right hilar nodal hypermetabolism. Bilateral pulmonary nodules with hypermetabolism. Many of these are present on the prior. A dominant left lower lobe lung mass measures 3.9 x 2.2 cm and a S.U.V. max of 10.3 on 85/5. At the site of a 9 mm nodule back on 05/24/2018. Incidental CT findings: Cardiomegaly. Aortic and coronary artery calcification. Deferred to recent diagnostic CT. ABDOMEN/PELVIS: Bilobar hypermetabolic hepatic hypoattenuating lesions, new since the remote PET. Example within the central left hepatic lobe at 2.9 cm and a S.U.V. max of 13.1 on 96/5. Within the  subcapsular right hepatic lobe at 3.1 cm and a S.U.V. max of 15.0 on 114/5. No extrahepatic abdominopelvic hypermetabolism. Incidental CT findings: Minimal left adrenal nodularity is unchanged. Normal right adrenal gland. Nonspecific caudate lobe enlargement. Cholecystectomy. Abdominal aortic atherosclerosis. SKELETON: No abnormal marrow activity. Incidental CT findings: None. IMPRESSION: 1. Development of hypermetabolic thoracic adenopathy and bilobar liver lesions. Favor metastatic disease from an unknown primary. The pulmonary parenchymal and thoracic nodal findings could be seen with sarcoidosis, but hepatic sarcoid of this severity would be atypical. 2. Hypermetabolic pulmonary nodularity, present on 05/24/2018 but progressive, especially in the left lower lobe. Differential considerations include an underlying progressive infectious/inflammatory etiology versus pulmonary metastasis superimposed upon chronic infection/inflammation. 3. Incidental findings, including: Coronary artery atherosclerosis. Aortic Atherosclerosis (ICD10-I70.0). Electronically Signed   By: Jeronimo Greaves M.D.   On: 08/23/2022 12:24    Labs:  CBC: Recent Labs    11/08/21 1405 08/15/22 1704  WBC 5.5 8.3  HGB 13.7 13.6  HCT 41.2 41.3  PLT 262.0 363.0    COAGS: No results for input(s): "INR", "APTT" in the last 8760 hours.  BMP: Recent Labs    07/13/22 1207 08/15/22 1704  NA 142 140  K 4.1 3.8  CL 101 100  CO2 31 31  GLUCOSE 112* 103*  BUN 14 15  CALCIUM 10.0 9.7  CREATININE 0.94 0.98    LIVER FUNCTION TESTS: Recent Labs    10/28/21 1421 07/13/22 1207  BILITOT 0.5 0.7  AST 15 18  ALT 18 19  ALKPHOS 70 74  PROT 6.9 6.9  ALBUMIN 4.1 3.9    TUMOR MARKERS: No results for input(s): "AFPTM", "CEA", "CA199", "CHROMGRNA" in the last 8760 hours.  Assessment and Plan:  Scheduled for liver lesion biopsy per Pulmonology Sarcoid vs cancer per notes Risks and benefits of liver lesion biopsy was discussed  with the patient and/or patient's family including, but not limited to bleeding, infection, damage to adjacent structures or low yield requiring additional tests.  All of the questions were answered and there is agreement to proceed. Consent signed and in chart.  Thank you for this interesting consult.  I greatly enjoyed meeting Madison Palmer and look forward to participating in their care.  A copy of this report was sent to the requesting provider on this date.  Electronically Signed: Robet Leu, PA-C 09/20/2022, 11:58 AM   I spent a total of  30 Minutes   in face to face in clinical consultation, greater than 50% of which was counseling/coordinating care for liver lesion biopsy

## 2022-09-22 ENCOUNTER — Telehealth: Payer: Self-pay | Admitting: Pulmonary Disease

## 2022-09-22 DIAGNOSIS — C787 Secondary malignant neoplasm of liver and intrahepatic bile duct: Secondary | ICD-10-CM

## 2022-09-22 LAB — SURGICAL PATHOLOGY

## 2022-09-22 NOTE — Telephone Encounter (Signed)
PT got test results. See's the word "Malignant" and this is very concerning to her. Would like to speak to Dr. Before her sched appt to get more clarity on the results so she has more understanding, less stress.  Pls have Dr. Call @ [phone]

## 2022-09-26 ENCOUNTER — Encounter (INDEPENDENT_AMBULATORY_CARE_PROVIDER_SITE_OTHER): Payer: Self-pay | Admitting: Family Medicine

## 2022-09-26 NOTE — Anesthesia Preprocedure Evaluation (Signed)
Anesthesia Evaluation  Patient identified by MRN, date of birth, ID band Patient awake    Reviewed: Allergy & Precautions, NPO status , Patient's Chart, lab work & pertinent test results  Airway Mallampati: II  TM Distance: >3 FB Neck ROM: Full    Dental no notable dental hx. (+) Dental Advisory Given   Pulmonary sleep apnea    Pulmonary exam normal breath sounds clear to auscultation       Cardiovascular hypertension, Pt. on home beta blockers and Pt. on medications + CAD  Normal cardiovascular exam Rhythm:Regular Rate:Normal  Echo 11/2021  1. Left ventricular ejection fraction, by estimation, is 60 to 65%. The left ventricle has normal function. The left ventricle has no regional wall motion abnormalities. There is severe asymmetric left ventricular hypertrophy of the Posterior segment. Left ventricular diastolic parameters are indeterminate.   2. Right ventricular systolic function is normal. The right ventricular size is normal. There is normal pulmonary artery systolic pressure. The estimated right ventricular systolic pressure is 19.8 mmHg.   3. The mitral valve is normal in structure. No evidence of mitral valve regurgitation. No evidence of mitral stenosis.   4. The aortic valve is normal in structure. Aortic valve regurgitation is not visualized. No aortic stenosis is present.   5. The inferior vena cava is normal in size with greater than 50% respiratory variability, suggesting right atrial pressure of 3 mmHg.   Comparison(s): 03/01/18 EF 60-65%. PA pressure .   Cardiac MRI 03/2022 IMPRESSION: 1.  No evidence of cardiac amyloidosis   2. Normal LV size, moderate hypertrophy, and normal systolic function (EF 60%)   3.  Normal RV size and systolic function (EF 52%)   4. RV insertion site late gadolinium enhancement, which is a nonspecific scar pattern often seen in setting of elevated pulmonary pressures   5.  Multiple pulmonary nodules. Recommend CT chest for further evaluation    Neuro/Psych  PSYCHIATRIC DISORDERS Anxiety Depression Bipolar Disorder Schizophrenia  negative neurological ROS     GI/Hepatic Neg liver ROS, hiatal hernia,GERD  ,,  Endo/Other  diabetesHypothyroidism  Morbid obesity  Renal/GU negative Renal ROS     Musculoskeletal  (+) Arthritis ,    Abdominal  (+) + obese  Peds  Hematology  (+) Blood dyscrasia, anemia   Anesthesia Other Findings   Reproductive/Obstetrics                             Anesthesia Physical Anesthesia Plan  ASA: 4  Anesthesia Plan: General   Post-op Pain Management: Minimal or no pain anticipated   Induction: Intravenous, Rapid sequence and Cricoid pressure planned  PONV Risk Score and Plan: 2 and Ondansetron, Dexamethasone and Treatment may vary due to age or medical condition  Airway Management Planned: Oral ETT and Video Laryngoscope Planned  Additional Equipment:   Intra-op Plan:   Post-operative Plan: Extubation in OR  Informed Consent: I have reviewed the patients History and Physical, chart, labs and discussed the procedure including the risks, benefits and alternatives for the proposed anesthesia with the patient or authorized representative who has indicated his/her understanding and acceptance.     Dental advisory given  Plan Discussed with: CRNA  Anesthesia Plan Comments:        Anesthesia Quick Evaluation

## 2022-09-26 NOTE — Telephone Encounter (Signed)
Thanks for letting me know. I still think moving forward with the procedure scheduled this week makes sense so that we can ensure there is nothing from a GI perspective causing these lesions. Thanks. GM

## 2022-09-26 NOTE — Telephone Encounter (Signed)
Please advise 

## 2022-09-26 NOTE — Telephone Encounter (Signed)
Discussed biopsy results with patient favoring lung primary of malignancy. Will go ahead and place referral to cancer center with Dr. Arbutus Ped She is willing to proceed.

## 2022-09-27 ENCOUNTER — Ambulatory Visit (HOSPITAL_COMMUNITY): Payer: BC Managed Care – PPO | Admitting: Certified Registered"

## 2022-09-27 ENCOUNTER — Other Ambulatory Visit: Payer: Self-pay | Admitting: Physician Assistant

## 2022-09-27 ENCOUNTER — Other Ambulatory Visit: Payer: Self-pay

## 2022-09-27 ENCOUNTER — Encounter (HOSPITAL_COMMUNITY)
Admission: RE | Disposition: A | Discharge status: Home / Self Care | Payer: Self-pay | Source: Home / Self Care | Attending: Gastroenterology

## 2022-09-27 ENCOUNTER — Ambulatory Visit (HOSPITAL_COMMUNITY)
Admission: RE | Admit: 2022-09-27 | Discharge: 2022-09-27 | Disposition: A | Discharge status: Home / Self Care | Payer: BC Managed Care – PPO | Attending: Gastroenterology | Admitting: Gastroenterology

## 2022-09-27 ENCOUNTER — Encounter (HOSPITAL_COMMUNITY): Payer: Self-pay | Admitting: Gastroenterology

## 2022-09-27 DIAGNOSIS — K2289 Other specified disease of esophagus: Secondary | ICD-10-CM | POA: Diagnosis not present

## 2022-09-27 DIAGNOSIS — D128 Benign neoplasm of rectum: Secondary | ICD-10-CM | POA: Diagnosis not present

## 2022-09-27 DIAGNOSIS — I10 Essential (primary) hypertension: Secondary | ICD-10-CM | POA: Insufficient documentation

## 2022-09-27 DIAGNOSIS — F319 Bipolar disorder, unspecified: Secondary | ICD-10-CM | POA: Insufficient documentation

## 2022-09-27 DIAGNOSIS — K219 Gastro-esophageal reflux disease without esophagitis: Secondary | ICD-10-CM

## 2022-09-27 DIAGNOSIS — K3189 Other diseases of stomach and duodenum: Secondary | ICD-10-CM

## 2022-09-27 DIAGNOSIS — K635 Polyp of colon: Secondary | ICD-10-CM | POA: Insufficient documentation

## 2022-09-27 DIAGNOSIS — K640 First degree hemorrhoids: Secondary | ICD-10-CM | POA: Diagnosis not present

## 2022-09-27 DIAGNOSIS — C801 Malignant (primary) neoplasm, unspecified: Secondary | ICD-10-CM | POA: Insufficient documentation

## 2022-09-27 DIAGNOSIS — D124 Benign neoplasm of descending colon: Secondary | ICD-10-CM

## 2022-09-27 DIAGNOSIS — R1310 Dysphagia, unspecified: Secondary | ICD-10-CM | POA: Diagnosis not present

## 2022-09-27 DIAGNOSIS — K5730 Diverticulosis of large intestine without perforation or abscess without bleeding: Secondary | ICD-10-CM

## 2022-09-27 DIAGNOSIS — C787 Secondary malignant neoplasm of liver and intrahepatic bile duct: Secondary | ICD-10-CM | POA: Diagnosis not present

## 2022-09-27 DIAGNOSIS — R140 Abdominal distension (gaseous): Secondary | ICD-10-CM | POA: Insufficient documentation

## 2022-09-27 DIAGNOSIS — Z6841 Body Mass Index (BMI) 40.0 and over, adult: Secondary | ICD-10-CM | POA: Insufficient documentation

## 2022-09-27 DIAGNOSIS — K317 Polyp of stomach and duodenum: Secondary | ICD-10-CM | POA: Diagnosis not present

## 2022-09-27 DIAGNOSIS — Z86010 Personal history of colonic polyps: Secondary | ICD-10-CM

## 2022-09-27 DIAGNOSIS — R194 Change in bowel habit: Secondary | ICD-10-CM | POA: Insufficient documentation

## 2022-09-27 DIAGNOSIS — G4730 Sleep apnea, unspecified: Secondary | ICD-10-CM | POA: Diagnosis not present

## 2022-09-27 DIAGNOSIS — E039 Hypothyroidism, unspecified: Secondary | ICD-10-CM | POA: Diagnosis not present

## 2022-09-27 DIAGNOSIS — D123 Benign neoplasm of transverse colon: Secondary | ICD-10-CM | POA: Diagnosis not present

## 2022-09-27 DIAGNOSIS — K449 Diaphragmatic hernia without obstruction or gangrene: Secondary | ICD-10-CM | POA: Insufficient documentation

## 2022-09-27 DIAGNOSIS — Z79899 Other long term (current) drug therapy: Secondary | ICD-10-CM | POA: Diagnosis not present

## 2022-09-27 DIAGNOSIS — D122 Benign neoplasm of ascending colon: Secondary | ICD-10-CM

## 2022-09-27 DIAGNOSIS — F419 Anxiety disorder, unspecified: Secondary | ICD-10-CM | POA: Diagnosis not present

## 2022-09-27 DIAGNOSIS — R152 Fecal urgency: Secondary | ICD-10-CM

## 2022-09-27 DIAGNOSIS — F209 Schizophrenia, unspecified: Secondary | ICD-10-CM | POA: Insufficient documentation

## 2022-09-27 HISTORY — PX: POLYPECTOMY: SHX5525

## 2022-09-27 HISTORY — PX: MALONEY DILATION: SHX5535

## 2022-09-27 HISTORY — PX: BIOPSY: SHX5522

## 2022-09-27 HISTORY — PX: COLONOSCOPY WITH PROPOFOL: SHX5780

## 2022-09-27 HISTORY — PX: ESOPHAGOGASTRODUODENOSCOPY (EGD) WITH PROPOFOL: SHX5813

## 2022-09-27 SURGERY — COLONOSCOPY WITH PROPOFOL
Anesthesia: General

## 2022-09-27 MED ORDER — ONDANSETRON HCL 4 MG/2ML IJ SOLN
INTRAMUSCULAR | Status: DC | PRN
  Administered 2022-09-27: 4 mg via INTRAVENOUS

## 2022-09-27 MED ORDER — LIDOCAINE 2% (20 MG/ML) 5 ML SYRINGE
INTRAMUSCULAR | Status: DC | PRN
  Administered 2022-09-27: 100 mg via INTRAVENOUS

## 2022-09-27 MED ORDER — PROPOFOL 10 MG/ML IV BOLUS
INTRAVENOUS | Status: DC | PRN
  Administered 2022-09-27: 25 ug/kg/min via INTRAVENOUS
  Administered 2022-09-27: 200 mg via INTRAVENOUS

## 2022-09-27 MED ORDER — SODIUM CHLORIDE 0.9 % IV SOLN: INTRAVENOUS | Status: DC

## 2022-09-27 MED ORDER — SUGAMMADEX SODIUM 200 MG/2ML IV SOLN
INTRAVENOUS | Status: DC | PRN
  Administered 2022-09-27: 200 mg via INTRAVENOUS

## 2022-09-27 MED ORDER — ROCURONIUM BROMIDE 10 MG/ML (PF) SYRINGE
PREFILLED_SYRINGE | INTRAVENOUS | Status: DC | PRN
  Administered 2022-09-27: 50 mg via INTRAVENOUS

## 2022-09-27 MED ORDER — SUCRALFATE 1 G PO TABS
1.0000 g | ORAL_TABLET | Freq: Three times a day (TID) | ORAL | 3 refills | Status: DC
Start: 2022-09-27 — End: 2023-02-13

## 2022-09-27 MED ORDER — DEXAMETHASONE SODIUM PHOSPHATE 10 MG/ML IJ SOLN
INTRAMUSCULAR | Status: DC | PRN
  Administered 2022-09-27: 10 mg via INTRAVENOUS

## 2022-09-27 MED ORDER — LACTATED RINGERS IV SOLN: INTRAVENOUS | Status: DC

## 2022-09-27 MED ORDER — SUCCINYLCHOLINE CHLORIDE 200 MG/10ML IV SOSY
PREFILLED_SYRINGE | INTRAVENOUS | Status: DC | PRN
  Administered 2022-09-27: 200 mg via INTRAVENOUS

## 2022-09-27 SURGICAL SUPPLY — 25 items

## 2022-09-27 NOTE — H&P (Signed)
GASTROENTEROLOGY PROCEDURE H&P NOTE   Primary Care Physician: Anne Ng, NP  HPI: Madison Palmer is a 57 y.o. female who presents for EGD/Colonoscopy for symptoms of dysphagia/bloating/change in bowel habits/colon polyps who has recent imaging showing metastatic liver disease need to rule out GI primary.  Past Medical History:  Diagnosis Date   Acute on chronic respiratory failure with hypoxia and hypercapnia (HCC) 09/15/2018   Anemia    Anxiety    Arrhythmia    tachycardia   Arthritis    Bipolar 1 disorder (HCC) 09/12/2018   Bipolar I disorder, current or most recent episode manic, with psychotic features (HCC)    Brief psychotic disorder (HCC) 08/22/2018   Chronic respiratory failure with hypoxia (HCC) 05/10/2018   Formatting of this note might be different from the original. Last Assessment & Plan:  Continue 1 L continuous on exertion and during sleep. On her next visit we will check OSA on CPAP/room air to decide whether she needs to take oxygen along with her on her cruise in May   Depression    Diverticulitis    Generalized edema 03/12/2018   GERD (gastroesophageal reflux disease)    Glaucoma    Hyperlipidemia    Hyperparathyroidism (HCC)    Hypertension    Inflammatory polyps of colon (HCC)    Leg swelling 01/14/2019   LVH (left ventricular hypertrophy)    Lymphedema    Morbid (severe) obesity due to excess calories (HCC) 05/11/2017   PCOS (polycystic ovarian syndrome)    Prediabetes    Recurrent UTI    Sleep apnea    CPAP   Thyroid disease    Vitamin D deficiency    Past Surgical History:  Procedure Laterality Date   BREAST BIOPSY  2015   CESAREAN SECTION  2004   CHOLECYSTECTOMY     INNER EAR SURGERY     ear and sinus surgery   LAPAROSCOPIC REPAIR AND REMOVAL OF GASTRIC BAND     OOPHORECTOMY Left    TONSILLECTOMY AND ADENOIDECTOMY     No current facility-administered medications for this encounter.   No current facility-administered  medications for this encounter. Allergies  Allergen Reactions   Other Other (See Comments)    Hydralazine Hcl Other reaction(s): Other (See Comments) Instructed not to take by Cardiology.   Fentanyl Hives    Other reaction(s): hives had fentanyl recently with benadryl, did OK   Levofloxacin Hives   Midazolam Hives   Pollen Extract     seasonal   Atorvastatin Other (See Comments)    Muscle pain in legs  Abdominal pain   Hydralazine Hcl Other (See Comments)    Hypercalcemia    Rosuvastatin Other (See Comments)    Abdominal pain Other reaction(s): diarrhea   Family History  Adopted: Yes  Problem Relation Age of Onset   Hearing loss Son        right    Healthy Son    Breast cancer Neg Hx    Social History   Socioeconomic History   Marital status: Married    Spouse name: Not on file   Number of children: 1   Years of education: Not on file   Highest education level: Not on file  Occupational History   Occupation: Unemployed  Tobacco Use   Smoking status: Never   Smokeless tobacco: Never  Vaping Use   Vaping Use: Never used  Substance and Sexual Activity   Alcohol use: Yes    Comment: social   Drug  use: No   Sexual activity: Not Currently  Other Topics Concern   Not on file  Social History Narrative   Lives with son    Currently separated from spouse   Left handed   Caffeine: coffee daily: half-caff 3 cups/day and decaf maybe 3-5 cups/day, occasional mtn dew, tea 1-2 times per week   Social Determinants of Health   Financial Resource Strain: Low Risk  (07/03/2022)   Overall Financial Resource Strain (CARDIA)    Difficulty of Paying Living Expenses: Not hard at all  Food Insecurity: No Food Insecurity (07/03/2022)   Hunger Vital Sign    Worried About Running Out of Food in the Last Year: Never true    Ran Out of Food in the Last Year: Never true  Transportation Needs: No Transportation Needs (07/03/2022)   PRAPARE - Administrator, Civil Service  (Medical): No    Lack of Transportation (Non-Medical): No  Physical Activity: Inactive (07/03/2022)   Exercise Vital Sign    Days of Exercise per Week: 0 days    Minutes of Exercise per Session: 0 min  Stress: No Stress Concern Present (07/03/2022)   [employer] of Occupational Health - Occupational Stress Questionnaire    Feeling of Stress : Not at all  Social Connections: Not on file  Intimate Partner Violence: Not on file    Physical Exam: There were no vitals filed for this visit. There is no height or weight on file to calculate BMI. GEN: NAD EYE: Sclerae anicteric ENT: MMM CV: Non-tachycardic GI: Soft, NT/ND NEURO:  Alert & Oriented x 3  Lab Results: No results for input(s): "WBC", "HGB", "HCT", "PLT" in the last 72 hours. BMET No results for input(s): "NA", "K", "CL", "CO2", "GLUCOSE", "BUN", "CREATININE", "CALCIUM" in the last 72 hours. LFT No results for input(s): "PROT", "ALBUMIN", "AST", "ALT", "ALKPHOS", "BILITOT", "BILIDIR", "IBILI" in the last 72 hours. PT/INR No results for input(s): "LABPROT", "INR" in the last 72 hours.   Impression / Plan: This is a 57 y.o.female who presents for EGD/Colonoscopy for symptoms of dysphagia/bloating/change in bowel habits/colon polyps who has recent imaging showing metastatic liver disease need to rule out GI primary.  The risks and benefits of endoscopic evaluation/treatment were discussed with the patient and/or family; these include but are not limited to the risk of perforation, infection, bleeding, missed lesions, lack of diagnosis, severe illness requiring hospitalization, as well as anesthesia and sedation related illnesses.  The patient's history has been reviewed, patient examined, no change in status, and deemed stable for procedure.  The patient and/or family is agreeable to proceed.    Corliss Parish, MD [facility] Office # [phone]

## 2022-09-27 NOTE — Op Note (Signed)
[facility] Patient Name: Madison Palmer Procedure Date : 09/27/2022 MRN: [id number] Attending MD: Corliss Parish , MD, [id number] Date of Birth: [birth year] CSN: [id number] Age: 57 Admit Type: Outpatient Procedure:                Upper GI endoscopy Indications:              Dysphagia, Esophageal reflux symptoms that persist                            despite appropriate therapy, Abdominal bloating,                            change in bowel habits Providers:                Corliss Parish, MD, Fransisca Connors, Melany Guernsey, Technician Referring MD:             Anne Ng, Comer Locket Vassie Loll MD, MD Medicines:                Monitored Anesthesia Care Complications:            No immediate complications. Estimated Blood Loss:     Estimated blood loss was minimal. Procedure:                Pre-Anesthesia Assessment:                           - Prior to the procedure, a History and Physical                            was performed, and patient medications and                            allergies were reviewed. The patient's tolerance of                            previous anesthesia was also reviewed. The risks                            and benefits of the procedure and the sedation                            options and risks were discussed with the patient.                            All questions were answered, and informed consent                            was obtained. Prior Anticoagulants: The patient has                            taken no anticoagulant or antiplatelet agents. ASA  Grade Assessment: III - A patient with severe                            systemic disease. After reviewing the risks and                            benefits, the patient was deemed in satisfactory                            condition to undergo the procedure.                           After obtaining informed consent, the  endoscope was                            passed under direct vision. Throughout the                            procedure, the patient's blood pressure, pulse, and                            oxygen saturations were monitored continuously. The                            [id number]-H190 ([id number] Olympus endoscope was introduced                            through the mouth, and advanced to the second part                            of duodenum. The upper GI endoscopy was                            accomplished without difficulty. The patient                            tolerated the procedure. Scope In: Scope Out: Findings:      No gross lesions were noted in the entire esophagus. The scope was       withdrawn. Dilation was performed with a Maloney dilator with mild       resistance at 54 Fr. The dilation site was examined following endoscope       reinsertion and showed no change.      The Z-line was irregular and was found 42 cm from the incisors.      A small amount of food (residue) was found in the entire examined       stomach.      Multiple 2 to 10 mm semi-sessile polyps with no bleeding and no stigmata       of recent bleeding were found in the gastric body. Biopsies were taken       with a cold forceps for histology to rule out dysplastic tissue though       more appearance significant for hyperplastic/inflammatory/fundic gland.      Patchy mildly erythematous mucosa without bleeding was found in the  entire examined stomach. Biopsies were taken with a cold forceps for       histology and Helicobacter pylori testing.      No gross lesions were noted in the duodenal bulb, in the first portion       of the duodenum and in the second portion of the duodenum. Biopsies were       taken with a cold forceps for histology. Impression:               - No gross lesions in the entire esophagus. Dilated.                           - Z-line irregular, 42 cm from the incisors.                            - A small amount of food (residue) in the stomach.                           - Multiple gastric polyps. Biopsied a few to rule                            out dysplasia.                           - Erythematous mucosa in the stomach. Biopsied.                           - No gross lesions in the duodenal bulb, in the                            first portion of the duodenum and in the second                            portion of the duodenum. Biopsied. Recommendation:           - Proceed to scheduled colonoscopy.                           - Continue current PPI dosing.                           - Carafate 3 times daily with meals as needed (new                            prescription given if patient wants to use).                           - Await pathology results.                           - If evidence of adenomatous/dysplastic tissue                            noted on stomach polyp biopsies then we will need  to consider endoscopic resection.                           - If patient's dysphagia symptoms persist, consider                            role of esophageal manometry.                           - The findings and recommendations were discussed                            with the patient.                           - The findings and recommendations were discussed                            with the patient's family. Procedure Code(s):        --- Professional ---                           [phone], Esophagogastroduodenoscopy, flexible,                            transoral; with biopsy, single or multiple                           43450, Dilation of esophagus, by unguided sound or                            bougie, single or multiple passes Diagnosis Code(s):        --- Professional ---                           K22.89, Other specified disease of esophagus                           K31.7, Polyp of stomach and duodenum                           K31.89,  Other diseases of stomach and duodenum                           R13.10, Dysphagia, unspecified                           K21.9, Gastro-esophageal reflux disease without                            esophagitis                           R14.0, Abdominal distension (gaseous) CPT copyright 2022 [employer]. All rights reserved. The codes documented in this report are preliminary and upon coder review may  be revised to meet current compliance requirements. Corliss Parish, MD 09/27/2022 9:43:33 AM Number  of Addenda: 0

## 2022-09-27 NOTE — Progress Notes (Signed)
[facility] [phone]   [phone]  CONSULT NOTE  REFERRING PHYSICIAN: Dr. Vassie Loll  REASON FOR CONSULTATION:  Metastatic Lung Cancer   HPI Madison Palmer is a 57 y.o. female past medical history significant for GERD, hypothyroidism, PCOS, sleep apnea, lymphedema, oophorectomy, hypertension, heart failure, restrictive lung disease, and bipolar is referred to the clinic for metastatic malignancy.   The patient saw Dr. Vassie Loll from pulmonary medicine on 07/21/2022 to a follow-up for pulmonary nodules which he had been following since January 2020. She mentions she moved to [facility] from [place] in 2016 but her lung nodules were noted even refused prior to moving to [place]. She had a CT of the chest without contrast performed on 07/30/2022 showing multiple pulmonary nodules bilaterally, stable to slightly increased in size from prior examination.  There was also new mediastinal lymphadenopathy compared to prior exam.  She had a PET scan on 08/21/2022 showing hypermetabolic thoracic adenopathy and bilobar liver lesions. This favors metastatic disease from an unknown primary.  The pulmonary parenchymal and thoracic nodal findings could be seen in sarcoidosis but the hepatic sarcoid of this severity would be atypical.  There is also hypermetabolic pulmonary nodules which were also present on 05/20/2018 exam that are progressive, especially in the left lower lobe.  The patient underwent a ultrasound-guided liver biopsy on 09/20/2022 the final pathology ([id number]) metastatic carcinoma, suggestive of lung primary tumor cells are positive for TTF-1 and negative for CDX 2 and GATA3  which is most consistent with adenocarcinoma.   Of note, the patient did undergo colonoscopy by Dr. Meridee Score on 09/27/22 which showed a 2 to 6 mm polyps in the rectum, descending colon, transverse colon, and ascending colon.  EGD showed multiple gastric polyps.  Final pathology from these  biopsies are negative for malignancy. She was seeing Dr. Meridee Score for reflux and digestive concerns.   Of note, the patient is a never smoker.  She denies any fever, chills, or night sweats.  He estimates she lost 25 pounds in 2 months.  She denies any significant dyspnea on exertion.  She had a cough yesterday which she believes is secondary to her anesthesia from her colonoscopy.  Otherwise she denies any significant cough.  Denies any hemoptysis.  She only experiences chest discomfort with significant coughing spells.  Denies any nausea or vomiting.  Denies any headache or visual changes.  The patient is adopted and not sure about her family's medical history.  Patient is married.  Her husband lives in [place].  She has 1 son.  As previously mentioned.  She is a never smoker.  She rarely drinks alcohol and estimates she only has 1-2 alcoholic beverages per year.   HPI  Past Medical History:  Diagnosis Date   Acute on chronic respiratory failure with hypoxia and hypercapnia (HCC) 09/15/2018   Anemia    Anxiety    Arrhythmia    tachycardia   Arthritis    Bipolar 1 disorder (HCC) 09/12/2018   Bipolar I disorder, current or most recent episode manic, with psychotic features (HCC)    Brief psychotic disorder (HCC) 08/22/2018   Chronic respiratory failure with hypoxia (HCC) 05/10/2018   Formatting of this note might be different from the original. Last Assessment & Plan:  Continue 1 L continuous on exertion and during sleep. On her next visit we will check OSA on CPAP/room air to decide whether she needs to take oxygen along with her on her cruise in May  Depression    Diverticulitis    Generalized edema 03/12/2018   GERD (gastroesophageal reflux disease)    Glaucoma    Hyperlipidemia    Hyperparathyroidism (HCC)    Hypertension    Inflammatory polyps of colon (HCC)    Leg swelling 01/14/2019   LVH (left ventricular hypertrophy)    Lymphedema    Morbid (severe) obesity due to  excess calories (HCC) 05/11/2017   PCOS (polycystic ovarian syndrome)    Prediabetes    Recurrent UTI    Sleep apnea    CPAP   Thyroid disease    Vitamin D deficiency     Past Surgical History:  Procedure Laterality Date   BREAST BIOPSY  2015   CESAREAN SECTION  2004   CHOLECYSTECTOMY     INNER EAR SURGERY     ear and sinus surgery   LAPAROSCOPIC REPAIR AND REMOVAL OF GASTRIC BAND     OOPHORECTOMY Left    TONSILLECTOMY AND ADENOIDECTOMY      Family History  Adopted: Yes  Problem Relation Age of Onset   Hearing loss Son        right    Healthy Son    Breast cancer Neg Hx     Social History Social History   Tobacco Use   Smoking status: Never   Smokeless tobacco: Never  Vaping Use   Vaping Use: Never used  Substance Use Topics   Alcohol use: Yes    Comment: social   Drug use: No    Allergies  Allergen Reactions   Other Other (See Comments)    Hydralazine Hcl Other reaction(s): Other (See Comments) Instructed not to take by Cardiology.   Fentanyl Hives    Other reaction(s): hives had fentanyl recently with benadryl, did OK   Levofloxacin Hives   Midazolam Hives   Pollen Extract     seasonal   Atorvastatin Other (See Comments)    Muscle pain in legs  Abdominal pain   Hydralazine Hcl Other (See Comments)    Hypercalcemia    Rosuvastatin Other (See Comments)    Abdominal pain Other reaction(s): diarrhea    Current Outpatient Medications  Medication Sig Dispense Refill   folic acid (FOLVITE) 1 MG tablet Take 1 tablet (1 mg total) by mouth daily. 30 tablet 2   prochlorperazine (COMPAZINE) 10 MG tablet Take 1 tablet (10 mg total) by mouth every 6 (six) hours as needed. 30 tablet 2   acetaminophen (TYLENOL) 500 MG tablet Take 1,000 mg by mouth 3 (three) times daily as needed for moderate pain or headache.     amLODipine (NORVASC) 10 MG tablet TAKE 1 TABLET BY MOUTH EVERY DAY 90 tablet 3   atorvastatin (LIPITOR) 20 MG tablet TAKE 1 TABLET(20 MG) BY MOUTH  3 TIMES A WEEK 12 tablet 5   benztropine (COGENTIN) 0.5 MG tablet Take 1 tablet (0.5 mg total) by mouth at bedtime. 30 tablet 2   cetirizine (ZYRTEC) 10 MG tablet TAKE 1 TABLET BY MOUTH EVERY DAY 90 tablet 1   Cholecalciferol (VITAMIN D3 MAXIMUM STRENGTH) 125 MCG (5000 UT) capsule Take 5,000 Units by mouth daily.     cinacalcet (SENSIPAR) 30 MG tablet Take 30 mg by mouth daily.     cloNIDine (CATAPRES) 0.1 MG tablet Take 0.1 mg by mouth 3 (three) times daily.     esomeprazole (NEXIUM) 40 MG capsule TAKE 1 CAPSULE BY MOUTH TWICE DAILY 180 capsule 2   fenofibrate (TRICOR) 145 MG tablet TAKE 1 TABLET(145 MG) BY  MOUTH DAILY 90 tablet 3   furosemide (LASIX) 40 MG tablet TAKE 1 TABLET BY MOUTH EVERY DAY 90 tablet 3   gabapentin (NEURONTIN) 100 MG capsule TAKE 2 CAPSULES(200 MG) BY MOUTH AT BEDTIME 180 capsule 3   labetalol (NORMODYNE) 300 MG tablet TAKE 1.5 TABLETS BY MOUTH TWICE DAILY 270 tablet 3   levothyroxine (SYNTHROID) 88 MCG tablet Take 88 mcg by mouth daily.     lisinopril (ZESTRIL) 40 MG tablet TAKE 1 TABLET BY MOUTH EVERY DAY 90 tablet 1   lurasidone (LATUDA) 40 MG TABS tablet Take 1 tablet (40 mg total) by mouth daily with breakfast. 30 tablet 2   metFORMIN (GLUCOPHAGE) 500 MG tablet Take 1 tablet (500 mg total) by mouth 2 (two) times daily with a meal. (Patient taking differently: Take 1,000 mg by mouth daily with breakfast.) 180 tablet 0   Polyethylene Glycol 3350 (MIRALAX PO) Take 1 Capful by mouth daily as needed (Constipation).     Potassium Chloride ER 20 MEQ TBCR TAKE 1 TABLET BY MOUTH EVERY DAY 90 tablet 2   sucralfate (CARAFATE) 1 g tablet Take 1 tablet (1 g total) by mouth 3 (three) times daily before meals. 90 tablet 3   TART CHERRY PO Take 1 tablet by mouth daily.     No current facility-administered medications for this visit.   Facility-Administered Medications Ordered in Other Visits  Medication Dose Route Frequency Provider Last Rate Last Admin   cyanocobalamin (VITAMIN  B12) injection 1,000 mcg  1,000 mcg Intramuscular Once Reylynn Vanalstine L, PA-C        REVIEW OF SYSTEMS:   Review of Systems  Constitutional: Positive for appetite change and weight loss.  Negative for chills, fatigue and fever  HENT: Negative for mouth sores, nosebleeds, sore throat and trouble swallowing.   Eyes: Negative for eye problems and icterus.  Respiratory: Negative for cough, hemoptysis, shortness of breath and wheezing.   Cardiovascular: Negative for chest pain and leg swelling.  Gastrointestinal: Positive for reflux and bowel changes.  Negative for abdominal pain, nausea and vomiting.  Genitourinary: Negative for bladder incontinence, difficulty urinating, dysuria, frequency and hematuria.   Musculoskeletal: Negative for back pain, gait problem, neck pain and neck stiffness.  Skin: Negative for itching and rash.  Neurological: Negative for dizziness, extremity weakness, gait problem, headaches, light-headedness and seizures.  Hematological: Negative for adenopathy. Does not bruise/bleed easily.  Psychiatric/Behavioral: Negative for confusion, depression and sleep disturbance. The patient is not nervous/anxious.     PHYSICAL EXAMINATION:  Blood pressure 129/65, pulse 79, temperature 98.6 F (37 C), resp. rate (!) 22, weight (!) 310 lb 8 oz (140.8 kg), last menstrual period 05/02/2017, SpO2 96 %.  ECOG PERFORMANCE STATUS: 1  Physical Exam  Constitutional: Oriented to person, place, and time and well-developed, well-nourished, and in no distress.  HENT:  Head: Normocephalic and atraumatic.  Mouth/Throat: Oropharynx is clear and moist. No oropharyngeal exudate.  Eyes: Conjunctivae are normal. Right eye exhibits no discharge. Left eye exhibits no discharge. No scleral icterus.  Neck: Normal range of motion. Neck supple.  Cardiovascular: Normal rate, regular rhythm, normal heart sounds and intact distal pulses.   Pulmonary/Chest: Effort normal and breath sounds  normal. No respiratory distress. No wheezes. No rales.  Abdominal: Soft. Bowel sounds are normal. Exhibits no distension and no mass. There is no tenderness.  Musculoskeletal: Normal range of motion. Exhibits no edema.  Lymphadenopathy:    No cervical adenopathy.  Neurological: Alert and oriented to person, place, and time. Exhibits  normal muscle tone she was examined in the wheelchair. Skin: Skin is warm and dry. No rash noted. Not diaphoretic. No erythema. No pallor.  Psychiatric: Mood, memory and judgment normal.  Vitals reviewed.  LABORATORY DATA: Lab Results  Component Value Date   WBC 7.2 09/28/2022   HGB 13.1 09/28/2022   HCT 40.0 09/28/2022   MCV 84.0 09/28/2022   PLT 311 09/28/2022      Chemistry      Component Value Date/Time   NA 142 09/28/2022 1303   NA 145 09/08/2021 0000   K 4.0 09/28/2022 1303   CL 102 09/28/2022 1303   CO2 35 (H) 09/28/2022 1303   BUN 12 09/28/2022 1303   BUN 36 (H) 08/22/2021 1606   CREATININE 0.80 09/28/2022 1303   CREATININE 1.13 (H) 12/24/2018 1402   GLU 97 09/08/2021 0000      Component Value Date/Time   CALCIUM 10.2 09/28/2022 1303   ALKPHOS 71 09/28/2022 1303   AST 20 09/28/2022 1303   ALT 18 09/28/2022 1303   BILITOT 0.5 09/28/2022 1303       RADIOGRAPHIC STUDIES: US BIOPSY (LIVER)  Result Date: 09/20/2022 INDICATION: No known primary, now with multiple hypermetabolic liver lesions worrisome for metastatic disease. Please perform ultrasound-guided biopsy for tissue diagnostic purposes. EXAM: ULTRASOUND GUIDED LIVER LESION BIOPSY COMPARISON:  PET-CT-08/21/2022 MEDICATIONS: None ANESTHESIA/SEDATION: Moderate (conscious) sedation was employed during this procedure as administered by the Interventional Radiology RN. A total of Versed 1.5 mg and Fentanyl 100 mcg was administered intravenously. Moderate Sedation Time: 12 minutes. The patient's level of consciousness and vital signs were monitored continuously by radiology nursing  throughout the procedure under my direct supervision. COMPLICATIONS: None immediate. PROCEDURE: Informed written consent was obtained from the patient after a discussion of the risks, benefits and alternatives to treatment. The patient understands and consents the procedure. A timeout was performed prior to the initiation of the procedure. Ultrasound scanning was performed of the right upper abdominal quadrant demonstrates multiple hypoechoic lesions and masses scattered throughout both the right and left lobes of the liver. A dominant at least 4.6 x 3.2 cm hypoechoic mass within the lateral segment of the left lobe of the liver was targeted for biopsy given location and sonographic window (image 15). The procedure was planned. The midline of the abdomen was prepped and draped in the usual sterile fashion. The overlying soft tissues were anesthetized with 1% lidocaine with epinephrine. A 17 gauge, 6.8 cm co-axial needle was advanced into a peripheral aspect of the lesion. This was followed by 5 core biopsies with an 18 gauge core device under direct ultrasound guidance. The coaxial needle tract was embolized with a small amount of Gel-Foam slurry and superficial hemostasis was obtained with manual compression. Post procedural scanning was negative for definitive area of hemorrhage or additional complication. A dressing was placed. The patient tolerated the procedure well without immediate post procedural complication. IMPRESSION: Technically successful ultrasound guided core needle biopsy of indeterminate hypoechoic mass within the lateral segment of the left lobe of the liver. Electronically Signed   By: Simonne Come M.D.   On: 09/20/2022 16:08    ASSESSMENT: This is a very pleasant 57 year old African-American female referred to clinic for what is felt to be stage IV non-small cell lung cancer, adenocarcinoma (T2a, N3, M1c).  She presented with bilateral lung nodules, thoracic adenopathy and bilobar liver  lesions.  She was diagnosed in May 2024.  Requested PD-L1 and foundation 1 today.  Will also arrange for [employer]  testing, especially considering she is a never smoker.   The patient was seen with Dr. Arbutus Ped today.  Dr. Arbutus Ped had a lengthy discussion with the patient today about her current condition and treatment options.  Dr. Arbutus Ped recommends that we request molecular studies with PD-L1 and foundation 1 to see if the patient is a candidate for any actionable mutations.  Will also arrange for [employer] testing.  We would recommend a brain MRI to complete the staging workup.  We expect the results of this to take 1 to 2 weeks.  If the patient does not have any active mutations, then her treatment will consist of systemic chemotherapy and immunotherapy with carboplatin for an AUC of 5, alimta 500 mg/m2, and Keytruda 200 mg IV every 3 weeks.  We will tentatively plan for the patient to start treatment on 10/10/22 to allow time for the results of the molecular studies.  We will see her back for a visit that day of treatment prior to starting any IV treatment. If she has an actionable mutation, we will cancel her treatment that day.   I will arrange for chemo education class prior to starting her first cycle of treatment.  I sent prescriptions for Compazine 10 mg p.o. every 6 hours as needed for nausea and vomiting to the patient's pharmacy.  We will also arrange for her to receive B12 injection in the clinic today.  I have also sent her prescription for folic acid to take 1 mg p.o. daily.  We discussed the adverse side effects of treatment including but not limited to alopecia, myelosuppression, nausea and vomiting, peripheral neuropathy, liver or renal dysfunction as well as immunotherapy mediated adverse effects.  We will see her back for follow-up visit on 10/10/2022 to review the molecular studies, brain MRI, and repeat blood work before undergoing cycle #1  The patient voices understanding  of current disease status and treatment options and is in agreement with the current care plan.  All questions were answered. The patient knows to call the clinic with any problems, questions or concerns. We can certainly see the patient much sooner if necessary.  Thank you so much for allowing me to participate in the care of Akiah L Renault. I will continue to follow up the patient with you and assist in her care.   Disclaimer: This note was dictated with voice recognition software. Similar sounding words can inadvertently be transcribed and may not be corrected upon review.   Yovanni Frenette L Kerly Rigsbee Sep 28, 2022, 2:33 PM  ADDENDUM: Hematology/Oncology Attending: I had a face-to-face encounter with the patient today.  I reviewed her records, lab, scan and recommended her care plan.  This is a very pleasant 57 years old female with past medical history significant for GERD, PCOS, hypothyroidism, sleep apnea, hypertension, congestive heart failure, restrictive lung disease as well as bipolar disorder.  The patient has been followed by Dr. Vassie Loll for suspicious pulmonary nodules that was seen on previous imaging studies since 2020.  She recently had CT scan of the chest without contrast on July 30, 2022 and that showed multiple pulmonary nodules bilaterally stable to slightly increased in size but there was also new mediastinal lymphadenopathy.  She had a PET scan on 08/21/2022 that unfortunately showed development of hypermetabolic thoracic adenopathy and bilobar liver lesions favoring metastatic disease from an unknown primary.  The pulmonary parenchymal and thoracic nodal finding could be seen with sarcoidosis.  There was also hypermetabolic pulmonary nodularity present on May 21, 2018 but  progressive especially the left lower lobe.  She had ultrasound of the abdomen on 08/24/2022 and that showed numerous hypoechoic masses throughout the liver correlating with hypermetabolic masses on the PET  scan and suspicious for metastatic disease.  On Sep 20, 2022 the patient underwent ultrasound-guided liver core biopsy by interventional radiology and the final pathology ([id number]) showed metastatic carcinoma suggestive of lung primary. The patient was referred to me today for evaluation and recommendation regarding treatment of her condition.  She is a never smoker. I had a lengthy discussion with the patient and her boyfriend about her current condition and treatment options. This is a 57 years old female with a stage IV (T2a, N3, M1C) non-small cell lung cancer likely adenocarcinoma diagnosed in May 2024 and presented with left lower lobe lung mass in addition to bilateral lung nodules as well as thoracic adenopathy and multiple liver metastasis. I had a lengthy discussion with the patient about her treatment options.  I explained to the patient that she has incurable condition and all the treatment will be of palliative nature.  She was given the option of palliative care and hospice versus consideration of palliative treatment with either systemic chemotherapy with carboplatin for AUC of 5, Alimta 500 Mg/M2 and Keytruda 200 Mg IV every 3 weeks if she has no actionable mutations on the molecular studies versus treatment with targeted therapy if the molecular studies showed an actionable mutations. The patient is interested in treatment and we will send blood sample to [employer] for the molecular studies and we will also send her biopsy tissue for molecular studies and PD-L1 expression by foundation 1. She is expected to start the first cycle of her systemic chemotherapy on October 10, 2022 if she has no actionable mutations. She will receive vitamin B12 injection today and will call her pharmacy with prescription for folic acid and Compazine.  The patient will have a chemotherapy education class before the first dose of her treatment. We will also complete her staging workup by ordering MRI of the  brain to rule out brain metastasis. The patient will come back for follow-up visit with the first day of her treatment. She was advised to call immediately if she has any other concerning symptoms in the interval. The total time spent in the appointment was 90 minutes. Disclaimer: This note was dictated with voice recognition software. Similar sounding words can inadvertently be transcribed and may be missed upon review. Lajuana Matte, MD

## 2022-09-27 NOTE — Anesthesia Procedure Notes (Signed)
Procedure Name: Intubation Date/Time: 09/27/2022 8:43 AM  Performed by: Brynda Peon, CRNAPre-anesthesia Checklist: Patient identified, Emergency Drugs available, Suction available, Patient being monitored and Timeout performed Patient Re-evaluated:Patient Re-evaluated prior to induction Oxygen Delivery Method: Circle system utilized Preoxygenation: Pre-oxygenation with 100% oxygen Induction Type: IV induction Laryngoscope Size: 3 and Glidescope Grade View: Grade I Tube type: Oral Tube size: 7.0 mm Number of attempts: 1 Airway Equipment and Method: Stylet and Video-laryngoscopy Placement Confirmation: ETT inserted through vocal cords under direct vision, CO2 detector and breath sounds checked- equal and bilateral Secured at: 21 cm Tube secured with: Tape Dental Injury: Teeth and Oropharynx as per pre-operative assessment

## 2022-09-27 NOTE — Discharge Instructions (Signed)
YOU HAD AN ENDOSCOPIC PROCEDURE TODAY: Refer to the procedure report and other information in the discharge instructions given to you for any specific questions about what was found during the examination. If this information does not answer your questions, please call [facility] office at [phone] to clarify.  ° °YOU SHOULD EXPECT: Some feelings of bloating in the abdomen. Passage of more gas than usual. Walking can help get rid of the air that was put into your GI tract during the procedure and reduce the bloating. If you had a lower endoscopy (such as a colonoscopy or flexible sigmoidoscopy) you may notice spotting of blood in your stool or on the toilet paper. Some abdominal soreness may be present for a day or two, also. ° °DIET: Your first meal following the procedure should be a light meal and then it is ok to progress to your normal diet. A half-sandwich or bowl of soup is an example of a good first meal. Heavy or fried foods are harder to digest and may make you feel nauseous or bloated. Drink plenty of fluids but you should avoid alcoholic beverages for 24 hours. If you had a esophageal dilation, please see attached instructions for diet.   ° °ACTIVITY: Your care partner should take you home directly after the procedure. You should plan to take it easy, moving slowly for the rest of the day. You can resume normal activity the day after the procedure however YOU SHOULD NOT DRIVE, use power tools, machinery or perform tasks that involve climbing or major physical exertion for 24 hours (because of the sedation medicines used during the test).  ° °SYMPTOMS TO REPORT IMMEDIATELY: °A gastroenterologist can be reached at any hour. Please call [phone]  for any of the following symptoms:  °Following lower endoscopy (colonoscopy, flexible sigmoidoscopy) °Excessive amounts of blood in the stool  °Significant tenderness, worsening of abdominal pains  °Swelling of the abdomen that is new, acute  °Fever of 100° or  higher  °Following upper endoscopy (EGD, EUS, ERCP, esophageal dilation) °Vomiting of blood or coffee ground material  °New, significant abdominal pain  °New, significant chest pain or pain under the shoulder blades  °Painful or persistently difficult swallowing  °New shortness of breath  °Black, tarry-looking or red, bloody stools ° °FOLLOW UP:  °If any biopsies were taken you will be contacted by phone or by letter within the next 1-3 weeks. Call [phone]  if you have not heard about the biopsies in 3 weeks.  °Please also call with any specific questions about appointments or follow up tests. ° °

## 2022-09-27 NOTE — Progress Notes (Signed)
This NN reached out to the pt to make introductory phone call and discuss details of her appt with Dr Arbutus Ped and [facility], tomorrow 5/30 @ 1:30.  No answer at primary number. This NN LVM requesting a call back upon receiving message.

## 2022-09-27 NOTE — Anesthesia Postprocedure Evaluation (Signed)
Anesthesia Post Note  Patient: Madison Palmer  Procedure(s) Performed: COLONOSCOPY WITH PROPOFOL ESOPHAGOGASTRODUODENOSCOPY (EGD) WITH PROPOFOL BIOPSY MALONEY DILATION POLYPECTOMY     Patient location during evaluation: PACU Anesthesia Type: General Level of consciousness: sedated and patient cooperative Pain management: pain level controlled Vital Signs Assessment: post-procedure vital signs reviewed and stable Respiratory status: spontaneous breathing Cardiovascular status: stable Anesthetic complications: no   No notable events documented.  Last Vitals:  Vitals:   09/27/22 1016 09/27/22 1020  BP:    Pulse: 78 78  Resp: (!) 26 20  Temp:    SpO2: (!) 89% 95%    Last Pain:  Vitals:   09/27/22 1020  TempSrc:   PainSc: 0-No pain                 Lewie Loron

## 2022-09-27 NOTE — Transfer of Care (Signed)
Immediate Anesthesia Transfer of Care Note  Patient: Madison Palmer  Procedure(s) Performed: COLONOSCOPY WITH PROPOFOL ESOPHAGOGASTRODUODENOSCOPY (EGD) WITH PROPOFOL BIOPSY MALONEY DILATION POLYPECTOMY  Patient Location: Endoscopy Unit  Anesthesia Type:General  Level of Consciousness: awake, alert , oriented, patient cooperative, and responds to stimulation  Airway & Oxygen Therapy: Patient Spontanous Breathing  Post-op Assessment: Report given to RN, Post -op Vital signs reviewed and stable, and Patient moving all extremities X 4  Post vital signs: Reviewed and stable  Last Vitals:  Vitals Value Taken Time  BP 138/79 09/27/22 0945  Temp    Pulse 82 09/27/22 0946  Resp 18 09/27/22 0946  SpO2 92 % 09/27/22 0946  Vitals shown include unvalidated device data.  Last Pain:  Vitals:   09/27/22 0753  TempSrc: Temporal  PainSc: 1          Complications: No notable events documented.

## 2022-09-27 NOTE — Op Note (Signed)
[facility] Patient Name: Madison Palmer Procedure Date : 09/27/2022 MRN: [id number] Attending MD: Corliss Parish , MD, [id number] Date of Birth: 06-19-1965 CSN: [id number] Age: 57 Admit Type: Outpatient Procedure:                Colonoscopy Indications:              High risk colon cancer surveillance: Personal                            history of colonic polyps, Incidental change in                            bowel habits noted Providers:                Corliss Parish, MD, Fransisca Connors, Melany Guernsey, Technician Referring MD:             Comer Locket Vassie Loll MD, MD, [name] Medicines:                Monitored Anesthesia Care Complications:            No immediate complications. Estimated Blood Loss:     Estimated blood loss was minimal. Procedure:                Pre-Anesthesia Assessment:                           - Prior to the procedure, a History and Physical                            was performed, and patient medications and                            allergies were reviewed. The patient's tolerance of                            previous anesthesia was also reviewed. The risks                            and benefits of the procedure and the sedation                            options and risks were discussed with the patient.                            All questions were answered, and informed consent                            was obtained. Prior Anticoagulants: The patient has                            taken no anticoagulant or antiplatelet agents. ASA  Grade Assessment: III - A patient with severe                            systemic disease. After reviewing the risks and                            benefits, the patient was deemed in satisfactory                            condition to undergo the procedure.                           After obtaining informed consent, the colonoscope                             was passed under direct vision. Throughout the                            procedure, the patient's blood pressure, pulse, and                            oxygen saturations were monitored continuously. The                            CF-HQ190L ([id number]) Olympus coloscope was                            introduced through the anus and advanced to the 3                            cm into the ileum. The colonoscopy was performed                            without difficulty. The patient tolerated the                            procedure. The quality of the bowel preparation was                            adequate. The terminal ileum, ileocecal valve,                            appendiceal orifice, and rectum were photographed. Scope In: 9:11:55 AM Scope Out: 9:32:22 AM Scope Withdrawal Time: 0 hours 16 minutes 40 seconds  Total Procedure Duration: 0 hours 20 minutes 27 seconds  Findings:      The digital rectal exam was normal. Pertinent negatives include no       palpable rectal lesions.      The terminal ileum and ileocecal valve appeared normal.      Eight sessile polyps were found in the rectum (1), descending colon (3),       transverse colon (2) and ascending colon (2). The polyps were 2 to 6 mm       in size. These polyps were removed with a cold snare. Resection and  retrieval were complete.      Multiple small-mouthed diverticula were found in the recto-sigmoid       colon, sigmoid colon, descending colon and ascending colon.      Normal mucosa was found in the entire colon. Biopsies for histology were       taken with a cold forceps from the entire colon for evaluation of       microscopic colitis.      Non-bleeding non-thrombosed internal hemorrhoids were found during       retroflexion and during digital exam. The hemorrhoids were Grade I       (internal hemorrhoids that do not prolapse). Impression:               - The examined portion of the ileum was  normal.                           - Eight, 2 to 6 mm polyps in the rectum, in the                            descending colon, in the transverse colon and in                            the ascending colon, removed with a cold snare.                            Resected and retrieved.                           - Diverticulosis in the recto-sigmoid colon, in the                            sigmoid colon, in the descending colon and in the                            ascending colon.                           - Normal mucosa in the entire examined colon                            otherwise. Biopsied.                           - Non-bleeding non-thrombosed internal hemorrhoids. Recommendation:           - The patient will be observed post-procedure,                            until all discharge criteria are met.                           - Discharge patient to home.                           - Patient has a contact number available for  emergencies. The signs and symptoms of potential                            delayed complications were discussed with the                            patient. Return to normal activities tomorrow.                            Written discharge instructions were provided to the                            patient.                           - High fiber diet.                           - Use FiberCon 1-2 tablets PO daily.                           - Continue present medications.                           - Await pathology results.                           - Repeat colonoscopy in 3 years for surveillance                            based on pathology results (and pending patient's                            overall health at that time as she is now dealing                            with metastatic disease in her liver from a non-GI                            perspective).                           - The findings and recommendations were  discussed                            with the patient.                           - The findings and recommendations were discussed                            with the patient's family. Procedure Code(s):        --- Professional ---                           [phone], Colonoscopy, flexible; with removal of  tumor(s), polyp(s), or other lesion(s) by snare                            technique                           45380, 59, Colonoscopy, flexible; with biopsy,                            single or multiple Diagnosis Code(s):        --- Professional ---                           Z86.010, Personal history of colonic polyps                           K64.0, First degree hemorrhoids                           D12.8, Benign neoplasm of rectum                           D12.4, Benign neoplasm of descending colon                           D12.3, Benign neoplasm of transverse colon (hepatic                            flexure or splenic flexure)                           D12.2, Benign neoplasm of ascending colon                           K57.30, Diverticulosis of large intestine without                            perforation or abscess without bleeding CPT copyright 2022 American [employer] Association. All rights reserved. The codes documented in this report are preliminary and upon coder review may  be revised to meet current compliance requirements. Corliss Parish, MD 09/27/2022 9:48:25 AM Number of Addenda: 0

## 2022-09-28 ENCOUNTER — Inpatient Hospital Stay: Payer: BC Managed Care – PPO

## 2022-09-28 ENCOUNTER — Inpatient Hospital Stay: Payer: BC Managed Care – PPO | Attending: Physician Assistant | Admitting: Physician Assistant

## 2022-09-28 ENCOUNTER — Encounter: Payer: Self-pay | Admitting: Internal Medicine

## 2022-09-28 VITALS — BP 129/65 | HR 79 | Temp 98.6°F | Resp 22 | Wt 310.5 lb

## 2022-09-28 DIAGNOSIS — C771 Secondary and unspecified malignant neoplasm of intrathoracic lymph nodes: Secondary | ICD-10-CM

## 2022-09-28 DIAGNOSIS — C787 Secondary malignant neoplasm of liver and intrahepatic bile duct: Secondary | ICD-10-CM | POA: Diagnosis not present

## 2022-09-28 DIAGNOSIS — C3490 Malignant neoplasm of unspecified part of unspecified bronchus or lung: Secondary | ICD-10-CM | POA: Insufficient documentation

## 2022-09-28 DIAGNOSIS — C3492 Malignant neoplasm of unspecified part of left bronchus or lung: Secondary | ICD-10-CM

## 2022-09-28 DIAGNOSIS — C3491 Malignant neoplasm of unspecified part of right bronchus or lung: Secondary | ICD-10-CM

## 2022-09-28 DIAGNOSIS — C3480 Malignant neoplasm of overlapping sites of unspecified bronchus and lung: Secondary | ICD-10-CM

## 2022-09-28 DIAGNOSIS — E538 Deficiency of other specified B group vitamins: Secondary | ICD-10-CM | POA: Diagnosis present

## 2022-09-28 DIAGNOSIS — R634 Abnormal weight loss: Secondary | ICD-10-CM

## 2022-09-28 DIAGNOSIS — Z7189 Other specified counseling: Secondary | ICD-10-CM | POA: Insufficient documentation

## 2022-09-28 LAB — CMP (CANCER CENTER ONLY)
ALT: 18 U/L (ref 0–44)
AST: 20 U/L (ref 15–41)
Albumin: 4.1 g/dL (ref 3.5–5.0)
Alkaline Phosphatase: 71 U/L (ref 38–126)
Anion gap: 5 (ref 5–15)
BUN: 12 mg/dL (ref 6–20)
CO2: 35 mmol/L — ABNORMAL HIGH (ref 22–32)
Calcium: 10.2 mg/dL (ref 8.9–10.3)
Chloride: 102 mmol/L (ref 98–111)
Creatinine: 0.8 mg/dL (ref 0.44–1.00)
GFR, Estimated: >60 mL/min (ref >60)
Glucose, Bld: 137 mg/dL — ABNORMAL HIGH (ref 70–99)
Potassium: 4 mmol/L (ref 3.5–5.1)
Sodium: 142 mmol/L (ref 135–145)
Total Bilirubin: 0.5 mg/dL (ref 0.3–1.2)
Total Protein: 7.2 g/dL (ref 6.5–8.1)

## 2022-09-28 LAB — CBC WITH DIFFERENTIAL (CANCER CENTER ONLY)
Abs Immature Granulocytes: 0.04 10*3/uL (ref 0.00–0.07)
Basophils Absolute: 0 10*3/uL (ref 0.0–0.1)
Basophils Relative: 0 %
Eosinophils Absolute: 0 10*3/uL (ref 0.0–0.5)
Eosinophils Relative: 0 %
HCT: 40 % (ref 36.0–46.0)
Hemoglobin: 13.1 g/dL (ref 12.0–15.0)
Immature Granulocytes: 1 %
Lymphocytes Relative: 20 %
Lymphs Abs: 1.5 10*3/uL (ref 0.7–4.0)
MCH: 27.5 pg (ref 26.0–34.0)
MCHC: 32.8 g/dL (ref 30.0–36.0)
MCV: 84 fL (ref 80.0–100.0)
Monocytes Absolute: 0.5 10*3/uL (ref 0.1–1.0)
Monocytes Relative: 7 %
Neutro Abs: 5.1 10*3/uL (ref 1.7–7.7)
Neutrophils Relative %: 72 %
Platelet Count: 311 10*3/uL (ref 150–400)
RBC: 4.76 MIL/uL (ref 3.87–5.11)
RDW: 14.4 % (ref 11.5–15.5)
WBC Count: 7.2 10*3/uL (ref 4.0–10.5)
nRBC: 0 % (ref 0.0–0.2)

## 2022-09-28 LAB — SURGICAL PATHOLOGY

## 2022-09-28 MED ORDER — CYANOCOBALAMIN 1000 MCG/ML IJ SOLN
1000.0000 ug | Freq: Once | INTRAMUSCULAR | Status: AC
  Administered 2022-09-28: 1000 ug via INTRAMUSCULAR
  Filled 2022-09-28: qty 1

## 2022-09-28 MED ORDER — FOLIC ACID 1 MG PO TABS: 1.0000 mg | ORAL_TABLET | Freq: Every day | ORAL | 2 refills | Status: DC

## 2022-09-28 MED ORDER — PROCHLORPERAZINE MALEATE 10 MG PO TABS: 10.0000 mg | ORAL_TABLET | Freq: Four times a day (QID) | ORAL | 2 refills | Status: DC | PRN

## 2022-09-28 NOTE — Progress Notes (Signed)
START OFF PATHWAY REGIMEN - Non-Small Cell Lung   [id number]:Pembrolizumab 200 mg  IV D1 + Pemetrexed 500 mg/m2 IV D1 + Carboplatin AUC=5 IV D1 q21 Days:   A cycle is every 21 days:     Pembrolizumab      Pemetrexed      Carboplatin   **Always confirm dose/schedule in your pharmacy ordering system**  Patient Characteristics: Stage IV Metastatic, Nonsquamous, Awaiting Molecular Test Results and Need to Start Chemotherapy, PS = 0, 1 Therapeutic Status: Stage IV Metastatic Histology: Nonsquamous Cell Broad Molecular Profiling Status: Awaiting Molecular Test Results and Need to Start Chemotherapy ECOG Performance Status: 1 Intent of Therapy: Non-Curative / Palliative Intent, Discussed with Patient 

## 2022-09-28 NOTE — Patient Instructions (Addendum)
Summary:  -There are two main categories of lung cancer, they are named based on the size of the cancer cell. One is called Non-Small cell lung cancer. The other type is Small Cell Lung Cancer -The sample (biopsy) that they took of your tumor was consistent with a subtype of Non-small cell lung cancer called Adenocarcinoma. This is the most common type of lung cancer.  -We covered a lot of important information at your appointment today regarding what the treatment plan is moving forward. Here are the the main points that were discussed at your office visit with [place] today:  -The most important thing is to check to see if you have any actionable mutations. AKA seeing if you have any markers that we have pills for.  -We are sending two different companies to check this. About 20% of people with adenocarcinoma may have a marker. We tend to see these markers more common in younger women, asian decent, or never smokers.  -If you do not have any "actionable mutations", then we do have other treatments.  -Surgery is not an option since there are too many spots -Radiation is uses as needed.  -Main treatment is treatment called "systemic treatment" which means treatment that goes everywhere. This is either the pill treatment (if a candidate) or IV treatment (chemo and immunotherapy). I have information listed below.  -The treatment that you will receive consists of two chemotherapy drugs, called Carboplatin and Alimta (also called Pemetrexed) and one immunotherapy drug called Keytruda (pembrolizumab).  -We are planning on starting your treatment tentatively on next week on 10/10/22 but before your start your treatment, I would like you to attend a Chemotherapy Education Class. This involves having you sit down with one of our nurse educators. She will discuss with your one-on-one more details about your treatment as well as general information about resources here at the cancer center.  -If you have a genetic  marker, then we will cancel the infusion.  -Your treatment will be given once every 3 weeks. We will check your labs once a week for the first ~5 treatments just to make sure that important components of your blood are in an acceptable range -We will get a CT scan after 3 treatments to check on the progress of treatment -With target therapy, average life expectancy is several year. Without a genetic marker, average is 2 years. Without treatment, average is 6 months.   Medications:  -I have sent a few important medication prescriptions to your pharmacy.  -Compazine was sent to your pharmacy. This medication is for nausea. You may take this every 6 hours as needed if you feel nauseous.  -I have also sent a prescription for 1 mg of folic acid to your pharmacy. We need you to take 1 tablet every day.  -We will administer vitamin B12 every 9 weeks while you are here in the clinic. You have received your first dose today.   Referrals or Imaging: -We do need a brain MRI to make sure that nothing has spread to this area.   Follow up:  -We will see you back for a follow up visit 2 weeks before starting the first treatment   -If you need to reach [place] at any time, the main office number to the cancer center is [phone], when you call, ask to speak to either Cassie's or Dr. Asa Lente nurse.

## 2022-09-29 ENCOUNTER — Other Ambulatory Visit: Payer: Self-pay

## 2022-09-29 ENCOUNTER — Telehealth: Payer: Self-pay | Admitting: Internal Medicine

## 2022-09-29 NOTE — Telephone Encounter (Signed)
Scheduled per 05/30 los, patient has been called and nitrified of upcoming appointments.  

## 2022-10-01 ENCOUNTER — Encounter (HOSPITAL_COMMUNITY): Payer: Self-pay | Admitting: Gastroenterology

## 2022-10-01 ENCOUNTER — Encounter: Payer: Self-pay | Admitting: Gastroenterology

## 2022-10-02 ENCOUNTER — Telehealth: Payer: Self-pay | Admitting: Medical Oncology

## 2022-10-02 ENCOUNTER — Other Ambulatory Visit: Payer: Self-pay | Admitting: Physician Assistant

## 2022-10-02 ENCOUNTER — Telehealth: Payer: Self-pay | Admitting: Gastroenterology

## 2022-10-02 NOTE — Telephone Encounter (Signed)
Inbound call from patient requesting a call back regarding recent endoscopy and colonoscopy results. She would like to discuss if she should continue nexium and carafate medication. Please advise, thank you.

## 2022-10-02 NOTE — Telephone Encounter (Signed)
Has anxiety "not claustrophobia"  about MRI and not being able to get out of the machine. Request something to relax her before scan.

## 2022-10-02 NOTE — Telephone Encounter (Signed)
The pt has been advised that per the procedure report she should continue her meds as prescribed. The pt has been advised of the information and verbalized understanding.

## 2022-10-02 NOTE — Telephone Encounter (Signed)
FYI

## 2022-10-03 ENCOUNTER — Encounter (INDEPENDENT_AMBULATORY_CARE_PROVIDER_SITE_OTHER): Payer: Self-pay | Admitting: Family Medicine

## 2022-10-03 ENCOUNTER — Telehealth: Payer: Self-pay | Admitting: Medical Oncology

## 2022-10-03 ENCOUNTER — Other Ambulatory Visit: Payer: Self-pay | Admitting: Internal Medicine

## 2022-10-03 ENCOUNTER — Ambulatory Visit (INDEPENDENT_AMBULATORY_CARE_PROVIDER_SITE_OTHER): Payer: BC Managed Care – PPO | Admitting: Family Medicine

## 2022-10-03 VITALS — BP 143/77 | HR 92 | Temp 98.2°F | Ht 62.0 in | Wt 304.0 lb

## 2022-10-03 DIAGNOSIS — Z6841 Body Mass Index (BMI) 40.0 and over, adult: Secondary | ICD-10-CM | POA: Diagnosis not present

## 2022-10-03 DIAGNOSIS — I10 Essential (primary) hypertension: Secondary | ICD-10-CM

## 2022-10-03 DIAGNOSIS — R7303 Prediabetes: Secondary | ICD-10-CM

## 2022-10-03 DIAGNOSIS — E669 Obesity, unspecified: Secondary | ICD-10-CM

## 2022-10-03 MED ORDER — OXYCODONE HCL 5 MG PO TABS: ORAL_TABLET | ORAL | 0 refills | Status: DC

## 2022-10-03 NOTE — Progress Notes (Signed)
Chief Complaint:   OBESITY Madison Palmer is here to discuss her progress with her obesity treatment plan along with follow-up of her obesity related diagnoses. Madison Palmer is on keeping a food journal and adhering to recommended goals of 1600-1700 calories and 100+ grams of protein and states she is following her eating plan approximately 85% of the time. Madison Palmer states she has been doing minimal walking.   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  Test was sent 5/30 and plan is to start 10/10/22.  Dr. Arbutus Ped did write down palliative care but patient doesn't recall discussing this. She mentions she wants to fight for as long as she can. She would like to continue at this clinic for as long as she can.  She lives in a ranch house with one small step and has a wheelchair.    Subjective:   1. Prediabetes Patient is on metformin currently, and her last A1c was 6.3.  2. Essential hypertension Patient's blood pressure is controlled today.  She denies chest pain, chest pressure, or headache.  Assessment/Plan:   1. Prediabetes Patient is to stop metformin, with patient starting chemo and needs to focus on PD intake.  2. Essential hypertension Patient is to reach out to Dr. Antoine Poche for guidance if she cannot tolerate oral medications.  3. BMI 50.0-59.9, adult (HCC)  4. Obesity with starting BMI of 61.2 Madison Palmer is currently in the action stage of change. As such, her goal is to continue with weight loss efforts. She has agreed to practicing portion control and making smarter food choices, such as increasing vegetables and decreasing simple carbohydrates.   Exercise goals: All adults should avoid  inactivity. Some physical activity is better than none, and adults who participate in any amount of physical activity gain some health benefits.  Behavioral modification strategies: increasing lean protein intake, meal planning and cooking strategies, keeping healthy foods in the home, and planning for success.  Madison Palmer has agreed to follow-up with our clinic in 5 weeks. She was informed of the importance of frequent follow-up visits to maximize her success with intensive lifestyle modifications for her multiple health conditions.   Objective:   Blood pressure (!) 143/77, pulse 92, temperature 98.2 F (36.8 C), height 5\' 2"  (1.575 m), weight (!) 304 lb (137.9 kg), last menstrual period 05/02/2017, SpO2 96 %. Body mass index is 55.6 kg/m.  General: Cooperative, alert, well developed, in no acute distress. HEENT: Conjunctivae and lids unremarkable. Cardiovascular: Regular rhythm.  Lungs: Normal work of breathing. Neurologic: No focal deficits.   Lab Results  Component Value Date   CREATININE 0.80 09/28/2022   BUN 12 09/28/2022   NA 142 09/28/2022   K 4.0 09/28/2022   CL 102 09/28/2022   CO2 35 (H) 09/28/2022   Lab Results  Component Value Date   ALT 18 09/28/2022   AST 20 09/28/2022   ALKPHOS 71 09/28/2022   BILITOT 0.5 09/28/2022   Lab Results  Component Value Date   HGBA1C 6.3 04/18/2022   HGBA1C 5.6 04/12/2021   HGBA1C 5.6 06/01/2020   HGBA1C 5.7 (H) 12/11/2019   HGBA1C 5.7 (H) 12/24/2018   Lab Results  Component Value Date   INSULIN  18.9 06/01/2020   INSULIN 11.4 12/11/2019   INSULIN 21.1 12/30/2018   INSULIN 9.6 02/27/2018   Lab Results  Component Value Date   TSH 1.66 08/15/2022   Lab Results  Component Value Date   CHOL 173 10/28/2021   HDL 48 10/28/2021   LDLCALC 102 (H) 10/28/2021   LDLDIRECT 94.0 04/18/2022   TRIG 132 10/28/2021   CHOLHDL 3.6 10/28/2021   Lab Results  Component Value Date   VD25OH 34.4 09/08/2021   VD25OH 29.0 (L) 09/01/2020    VD25OH 19.7 (L) 06/01/2020   Lab Results  Component Value Date   WBC 7.2 09/28/2022   HGB 13.1 09/28/2022   HCT 40.0 09/28/2022   MCV 84.0 09/28/2022   PLT 311 09/28/2022   Lab Results  Component Value Date   IRON 44 02/20/2018   TIBC 347 05/29/2017   Attestation Statements:   Reviewed by clinician on day of visit: allergies, medications, problem list, medical history, surgical history, family history, social history, and previous encounter notes.  Time spent on visit including pre-visit chart review and post-visit care and charting was 25 minutes.   I, Burt Knack, am acting as transcriptionist for Reuben Likes, MD.  I have reviewed the above documentation for accuracy and completeness, and I agree with the above. - Reuben Likes, MD

## 2022-10-03 NOTE — Progress Notes (Signed)
Pharmacist Chemotherapy Monitoring - Initial Assessment    Anticipated start date: 10/10/22   The following has been reviewed per standard work regarding the patient's treatment regimen: The patient's diagnosis, treatment plan and drug doses, and organ/hematologic function Lab orders and baseline tests specific to treatment regimen  The treatment plan start date, drug sequencing, and pre-medications Prior authorization status  Patient's documented medication list, including drug-drug interaction screen and prescriptions for anti-emetics and supportive care specific to the treatment regimen The drug concentrations, fluid compatibility, administration routes, and timing of the medications to be used The patient's access for treatment and lifetime cumulative dose history, if applicable  The patient's medication allergies and previous infusion related reactions, if applicable   Changes made to treatment plan:  N/A  Follow up needed:  N/A   Demetrius Charity, RPH, 10/03/2022  2:10 PM

## 2022-10-03 NOTE — Telephone Encounter (Signed)
Pt notified that Arbutus Ped was calling in oxycodone to take before MRI.

## 2022-10-04 ENCOUNTER — Encounter: Payer: Self-pay | Admitting: Internal Medicine

## 2022-10-04 ENCOUNTER — Encounter (HOSPITAL_BASED_OUTPATIENT_CLINIC_OR_DEPARTMENT_OTHER): Payer: Self-pay | Admitting: Pulmonary Disease

## 2022-10-04 ENCOUNTER — Ambulatory Visit (INDEPENDENT_AMBULATORY_CARE_PROVIDER_SITE_OTHER): Payer: BC Managed Care – PPO | Admitting: Pulmonary Disease

## 2022-10-04 VITALS — BP 124/70 | HR 84 | Temp 98.4°F | Ht 62.0 in | Wt 306.6 lb

## 2022-10-04 DIAGNOSIS — G4733 Obstructive sleep apnea (adult) (pediatric): Secondary | ICD-10-CM | POA: Diagnosis not present

## 2022-10-04 DIAGNOSIS — C3492 Malignant neoplasm of unspecified part of left bronchus or lung: Secondary | ICD-10-CM

## 2022-10-04 NOTE — Progress Notes (Signed)
   Subjective:    Patient ID: Madison Palmer, female    DOB: 02-20-1966, 57 y.o.   MRN: [id number]  HPI  57 yo morbidly obese never smoker with chronic lymphedema  for FU  of severe obstructive sleep apnea & pulm nodules. 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  lap band was removed in 2013.    PMH - laryngospasm with prior surgery Chronic diastolic heart failure   cardiac MRI was negative for amyloidosis, showed multiple pulmonary nodules  Previously pulmonary nodules were noted to be stable from 2020- 2022 and low-grade hypermetabolic on PET.  Follow-up CT chest  showed increase in size of the nodules bilaterally and new mediastinal lymphadenopathy.  PET showed hypermetabolism in lymphadenopathy and nodules and also new liver lesions She underwent ultrasound biopsy of liver lesion which was consistent with adenocarcinoma and staining was consistent with lung primary.  She has been seen by oncology, pathology has been sent for markers and based on results she may need chemotherapy versus targeted therapy. She arrives with her son today.  She is trying to cope with this diagnosis of metastatic cancer O2 saturation is 93%   Significant tests/ events reviewed    12/2018 ONO showed SpO2 low 85%, baseline 91%. 8 mins <88% >>  continue O2 blended into CPAP   PFTs severe restriction due to obesity 01/2020-FVC 1.15 (34%), FEV1 0.88 (33%), ratio 76, DLCOco 162%   CT chest 06/2022 increased size of nodules compared to 2022, new mediastinal lymphadenopathy    CT chest 07/2020 multiple pulmonary nodules, all stable CT chest 01/2020  stable pulmonary nodules, favoring benign cause CT angiogram 05/10/18  was negative for pulmonary emboli, but showed numerous pulmonary nodules largest 15 mm in the right lower lobe and 13 mm in the lingula. Mosaic attenuation was also noted   suggesting air trapping       PET 07/2022 hypermetabolic pulmonary nodules, mediastinal adenopathy and liver lesions PET 05/24/18  low-grade hypermetabolism SUV 3-4 range in the nodules.  Mediastinum did not show any hypermetabolic     prior CT chest from 2010 and 2011  >> shows multiple noncalcified pulmonary nodules 3 to 6 mm, largest being 6 mm    NP SG 2008 showed AHI of 100/hour which was corrected by CPAP of 14 cm.      rheumatology evaluation >> positive ANA and positive RA factor but negative CCP and no evidence of synovitis , ENA neg ACE 40  Review of Systems neg for any significant sore throat, dysphagia, itching, sneezing, nasal congestion or excess/ purulent secretions, fever, chills, sweats, unintended wt loss, pleuritic or exertional cp, hempoptysis, orthopnea pnd or change in chronic leg swelling. Also denies presyncope, palpitations, heartburn, abdominal pain, nausea, vomiting, diarrhea or change in bowel or urinary habits, dysuria,hematuria, rash, arthralgias, visual complaints, headache, numbness weakness or ataxia.     Objective:   Physical Exam  Gen. Pleasant, obese, in no distress ENT - no lesions, no post nasal drip Neck: No JVD, no thyromegaly, no carotid bruits Lungs: no use of accessory muscles, no dullness to percussion, decreased without rales or rhonchi  Cardiovascular: Rhythm regular, heart sounds  normal, no murmurs or gallops, no peripheral edema Musculoskeletal: No deformities, no cyanosis or clubbing , no tremors       Assessment & Plan:

## 2022-10-04 NOTE — Patient Instructions (Signed)
Praying that markers come positive Good wishes for your treatment

## 2022-10-04 NOTE — Assessment & Plan Note (Signed)
I provided psychological support on today's visit, we discussed implications of diagnosis of metastatic cancer.  I am hopeful that markers will be positive and we can provide a targeted therapy.  Otherwise she is agreeable to proceed with chemotherapy/immunotherapy as needed

## 2022-10-04 NOTE — Assessment & Plan Note (Signed)
Compliant with CPAP.  No problems with mask or pressure.  CPAP is definitely helped improve her daytime somnolence and fatigue

## 2022-10-05 ENCOUNTER — Encounter (HOSPITAL_COMMUNITY): Payer: Self-pay

## 2022-10-05 ENCOUNTER — Inpatient Hospital Stay: Payer: BC Managed Care – PPO | Attending: Physician Assistant

## 2022-10-05 ENCOUNTER — Other Ambulatory Visit: Payer: Self-pay

## 2022-10-05 DIAGNOSIS — Z79620 Long term (current) use of immunosuppressive biologic: Secondary | ICD-10-CM | POA: Insufficient documentation

## 2022-10-05 DIAGNOSIS — R634 Abnormal weight loss: Secondary | ICD-10-CM | POA: Insufficient documentation

## 2022-10-05 DIAGNOSIS — F319 Bipolar disorder, unspecified: Secondary | ICD-10-CM | POA: Insufficient documentation

## 2022-10-05 DIAGNOSIS — C3490 Malignant neoplasm of unspecified part of unspecified bronchus or lung: Secondary | ICD-10-CM | POA: Insufficient documentation

## 2022-10-05 DIAGNOSIS — Z5112 Encounter for antineoplastic immunotherapy: Secondary | ICD-10-CM | POA: Insufficient documentation

## 2022-10-05 DIAGNOSIS — Z5111 Encounter for antineoplastic chemotherapy: Secondary | ICD-10-CM | POA: Insufficient documentation

## 2022-10-05 DIAGNOSIS — Z79899 Other long term (current) drug therapy: Secondary | ICD-10-CM | POA: Insufficient documentation

## 2022-10-05 DIAGNOSIS — R059 Cough, unspecified: Secondary | ICD-10-CM | POA: Insufficient documentation

## 2022-10-05 DIAGNOSIS — C787 Secondary malignant neoplasm of liver and intrahepatic bile duct: Secondary | ICD-10-CM | POA: Insufficient documentation

## 2022-10-05 LAB — GUARDANT 360

## 2022-10-06 ENCOUNTER — Telehealth: Payer: Self-pay | Admitting: Medical Oncology

## 2022-10-06 NOTE — Telephone Encounter (Signed)
Asking for guardant results. I told her I will message Arbutus Ped and someone will get back with her  and to keep appt Tuesday as scheduled.

## 2022-10-07 ENCOUNTER — Ambulatory Visit (HOSPITAL_COMMUNITY)
Admission: RE | Admit: 2022-10-07 | Discharge: 2022-10-07 | Disposition: A | Discharge status: Home / Self Care | Payer: BC Managed Care – PPO | Source: Ambulatory Visit | Attending: Physician Assistant | Admitting: Physician Assistant

## 2022-10-07 DIAGNOSIS — C3480 Malignant neoplasm of overlapping sites of unspecified bronchus and lung: Secondary | ICD-10-CM | POA: Diagnosis present

## 2022-10-07 MED ORDER — GADOBUTROL 1 MMOL/ML IV SOLN
10.0000 mL | Freq: Once | INTRAVENOUS | Status: AC | PRN
  Administered 2022-10-07: 10 mL via INTRAVENOUS

## 2022-10-08 ENCOUNTER — Other Ambulatory Visit (HOSPITAL_COMMUNITY): Payer: Self-pay | Admitting: Psychiatry

## 2022-10-08 DIAGNOSIS — F319 Bipolar disorder, unspecified: Secondary | ICD-10-CM

## 2022-10-08 DIAGNOSIS — R251 Tremor, unspecified: Secondary | ICD-10-CM

## 2022-10-09 ENCOUNTER — Other Ambulatory Visit: Payer: Self-pay

## 2022-10-09 ENCOUNTER — Telehealth: Payer: Self-pay | Admitting: Physician Assistant

## 2022-10-09 ENCOUNTER — Telehealth: Payer: Self-pay | Admitting: Medical Oncology

## 2022-10-09 MED FILL — Dexamethasone Sodium Phosphate Inj 100 MG/10ML: INTRAMUSCULAR | Qty: 1 | Status: AC

## 2022-10-09 MED FILL — Fosaprepitant Dimeglumine For IV Infusion 150 MG (Base Eq): INTRAVENOUS | Qty: 5 | Status: AC

## 2022-10-09 NOTE — Telephone Encounter (Signed)
I told pt to keep appt tomorrow.

## 2022-10-09 NOTE — Telephone Encounter (Signed)
She will be 10 mins late for her appt with Cassie. She has "appt with psychiatrist at same time ,but it only lasts 10 mins.".

## 2022-10-09 NOTE — Progress Notes (Unsigned)
[facility] OFFICE PROGRESS NOTE  [name], [name], NP [address] [place] [address]  DIAGNOSIS: Stage IV non-small cell lung cancer, adenocarcinoma (T2a, N3, M1c).  She presented with bilateral lung nodules, thoracic adenopathy and bilobar liver lesions.  She was diagnosed in May 2024.    PDL1: 0  Guardant 360: Negative for any actionable mutations  PRIOR THERAPY: None  CURRENT THERAPY: Palliative systemic chemotherapy and immunotherapy with carboplatin for an AUC of 5, alimta 500 mg/m2, and Keytruda 200 mg IV every 3 weeks. First dose on 10/10/22.   INTERVAL HISTORY: Madison Palmer 57 y.o. female returns to clinic today for follow-up visit accompanied by ***.  The patient is a never smoker and was unfortunately recently diagnosed with metastatic lung cancer.  She was seen in the clinic on 09/28/2022 to establish care.  She had molecular studies to see if she was a candidate for any targeted treatment.  Her molecular studies by [employer] did not show any actionable mutations.  Her PD-L1 expression is 0.  Foundation 1 ***.   Therefore, the plan is to undergo systemic chemotherapy and immunotherapy today.  She had her chemo education class and does not have any questions except for ***  She is compliant with her folic acid.  Also had her staging brain MRI which was ***.   Today she denies any changes in her health.  She denies any fever, chills, or night sweats.  She had been losing weight prior to her diagnosis for which ***.   She denies any shortness of breath.  Cough?  Denies any chest pain or hemoptysis.  Denies any nausea or vomiting.  Denies any diarrhea or constipation.  Denies any headache or visual changes.  She is here today for evaluation and to answer any of her questions prior to her starting cycle #1.  MEDICAL HISTORY: Past Medical History:  Diagnosis Date   Acute on chronic respiratory failure with hypoxia and hypercapnia (HCC) 09/15/2018    Anemia    Anxiety    Arrhythmia    tachycardia   Arthritis    Bipolar 1 disorder (HCC) 09/12/2018   Bipolar I disorder, current or most recent episode manic, with psychotic features (HCC)    Brief psychotic disorder (HCC) 08/22/2018   Chronic respiratory failure with hypoxia (HCC) 05/10/2018   Formatting of this note might be different from the original. Last Assessment & Plan:  Continue 1 L continuous on exertion and during sleep. On her next visit we will check OSA on CPAP/room air to decide whether she needs to take oxygen along with her on her cruise in May   Depression    Diverticulitis    Generalized edema 03/12/2018   GERD (gastroesophageal reflux disease)    Glaucoma    Hyperlipidemia    Hyperparathyroidism (HCC)    Hypertension    Inflammatory polyps of colon (HCC)    Leg swelling 01/14/2019   LVH (left ventricular hypertrophy)    Lymphedema    Morbid (severe) obesity due to excess calories (HCC) 05/11/2017   PCOS (polycystic ovarian syndrome)    Prediabetes    Recurrent UTI    Sleep apnea    CPAP   Thyroid disease    Vitamin D deficiency     ALLERGIES:  is allergic to other, fentanyl, levofloxacin, midazolam, pollen extract, atorvastatin, hydralazine hcl, and rosuvastatin.  MEDICATIONS:  Current Outpatient Medications  Medication Sig Dispense Refill   acetaminophen (TYLENOL) 500 MG tablet Take 1,000 mg  by mouth 3 (three) times daily as needed for moderate pain or headache.     amLODipine (NORVASC) 10 MG tablet TAKE 1 TABLET BY MOUTH EVERY DAY 90 tablet 3   atorvastatin (LIPITOR) 20 MG tablet TAKE 1 TABLET(20 MG) BY MOUTH 3 TIMES A WEEK 12 tablet 5   benztropine (COGENTIN) 0.5 MG tablet Take 1 tablet (0.5 mg total) by mouth at bedtime. 30 tablet 2   cetirizine (ZYRTEC) 10 MG tablet TAKE 1 TABLET BY MOUTH EVERY DAY 90 tablet 1   Cholecalciferol (VITAMIN D3 MAXIMUM STRENGTH) 125 MCG (5000 UT) capsule Take 5,000 Units by mouth daily.     cinacalcet (SENSIPAR) 30 MG  tablet Take 30 mg by mouth daily.     cloNIDine (CATAPRES) 0.1 MG tablet Take 0.1 mg by mouth 3 (three) times daily.     esomeprazole (NEXIUM) 40 MG capsule TAKE 1 CAPSULE BY MOUTH TWICE DAILY 180 capsule 2   fenofibrate (TRICOR) 145 MG tablet TAKE 1 TABLET(145 MG) BY MOUTH DAILY 90 tablet 3   folic acid (FOLVITE) 1 MG tablet Take 1 tablet (1 mg total) by mouth daily. (Patient not taking: Reported on 10/03/2022) 30 tablet 2   furosemide (LASIX) 40 MG tablet TAKE 1 TABLET BY MOUTH EVERY DAY 90 tablet 3   gabapentin (NEURONTIN) 100 MG capsule TAKE 2 CAPSULES(200 MG) BY MOUTH AT BEDTIME 180 capsule 3   labetalol (NORMODYNE) 300 MG tablet TAKE 1.5 TABLETS BY MOUTH TWICE DAILY 270 tablet 3   levothyroxine (SYNTHROID) 88 MCG tablet Take 88 mcg by mouth daily.     lisinopril (ZESTRIL) 40 MG tablet TAKE 1 TABLET BY MOUTH EVERY DAY 90 tablet 1   lurasidone (LATUDA) 40 MG TABS tablet Take 1 tablet (40 mg total) by mouth daily with breakfast. 30 tablet 2   oxyCODONE (OXY IR/ROXICODONE) 5 MG immediate release tablet 1 tablet 30 minutes before the MRI.  May repeat once if needed. 10 tablet 0   Polyethylene Glycol 3350 (MIRALAX PO) Take 1 Capful by mouth daily as needed (Constipation).     Potassium Chloride ER 20 MEQ TBCR TAKE 1 TABLET BY MOUTH EVERY DAY 90 tablet 2   prochlorperazine (COMPAZINE) 10 MG tablet Take 1 tablet (10 mg total) by mouth every 6 (six) hours as needed. (Patient not taking: Reported on 10/03/2022) 30 tablet 2   sucralfate (CARAFATE) 1 g tablet Take 1 tablet (1 g total) by mouth 3 (three) times daily before meals. (Patient not taking: Reported on 10/04/2022) 90 tablet 3   TART CHERRY PO Take 1 tablet by mouth daily.     No current facility-administered medications for this visit.    SURGICAL HISTORY:  Past Surgical History:  Procedure Laterality Date   BIOPSY  09/27/2022   Procedure: BIOPSY;  Surgeon: Mansouraty, Netty Starring., MD;  Location: [facility] ENDOSCOPY;  Service: Gastroenterology;;    BREAST BIOPSY  2015   CESAREAN SECTION  2004   CHOLECYSTECTOMY     COLONOSCOPY WITH PROPOFOL N/A 09/27/2022   Procedure: COLONOSCOPY WITH PROPOFOL;  Surgeon: Lemar Lofty., MD;  Location: [facility] ENDOSCOPY;  Service: Gastroenterology;  Laterality: N/A;   ESOPHAGOGASTRODUODENOSCOPY (EGD) WITH PROPOFOL N/A 09/27/2022   Procedure: ESOPHAGOGASTRODUODENOSCOPY (EGD) WITH PROPOFOL;  Surgeon: Meridee Score Netty Starring., MD;  Location: [facility] ENDOSCOPY;  Service: Gastroenterology;  Laterality: N/A;   INNER EAR SURGERY     ear and sinus surgery   LAPAROSCOPIC REPAIR AND REMOVAL OF GASTRIC BAND     MALONEY DILATION  09/27/2022   Procedure: MALONEY  DILATION;  Surgeon: Meridee Score Netty Starring., MD;  Location: [facility] ENDOSCOPY;  Service: Gastroenterology;;   OOPHORECTOMY Left    POLYPECTOMY  09/27/2022   Procedure: POLYPECTOMY;  Surgeon: Lemar Lofty., MD;  Location: [facility] ENDOSCOPY;  Service: Gastroenterology;;   TONSILLECTOMY AND ADENOIDECTOMY      REVIEW OF SYSTEMS:   Review of Systems  Constitutional: Negative for appetite change, chills, fatigue, fever and unexpected weight change.  HENT:   Negative for mouth sores, nosebleeds, sore throat and trouble swallowing.   Eyes: Negative for eye problems and icterus.  Respiratory: Negative for cough, hemoptysis, shortness of breath and wheezing.   Cardiovascular: Negative for chest pain and leg swelling.  Gastrointestinal: Negative for abdominal pain, constipation, diarrhea, nausea and vomiting.  Genitourinary: Negative for bladder incontinence, difficulty urinating, dysuria, frequency and hematuria.   Musculoskeletal: Negative for back pain, gait problem, neck pain and neck stiffness.  Skin: Negative for itching and rash.  Neurological: Negative for dizziness, extremity weakness, gait problem, headaches, light-headedness and seizures.  Hematological: Negative for adenopathy. Does not bruise/bleed easily.  Psychiatric/Behavioral: Negative for confusion,  depression and sleep disturbance. The patient is not nervous/anxious.     PHYSICAL EXAMINATION:  Last menstrual period 05/02/2017.  ECOG PERFORMANCE STATUS: {CHL ONC ECOG [id number]  Physical Exam  Constitutional: Oriented to person, place, and time and well-developed, well-nourished, and in no distress. No distress.  HENT:  Head: Normocephalic and atraumatic.  Mouth/Throat: Oropharynx is clear and moist. No oropharyngeal exudate.  Eyes: Conjunctivae are normal. Right eye exhibits no discharge. Left eye exhibits no discharge. No scleral icterus.  Neck: Normal range of motion. Neck supple.  Cardiovascular: Normal rate, regular rhythm, normal heart sounds and intact distal pulses.   Pulmonary/Chest: Effort normal and breath sounds normal. No respiratory distress. No wheezes. No rales.  Abdominal: Soft. Bowel sounds are normal. Exhibits no distension and no mass. There is no tenderness.  Musculoskeletal: Normal range of motion. Exhibits no edema.  Lymphadenopathy:    No cervical adenopathy.  Neurological: Alert and oriented to person, place, and time. Exhibits normal muscle tone. Gait normal. Coordination normal.  Skin: Skin is warm and dry. No rash noted. Not diaphoretic. No erythema. No pallor.  Psychiatric: Mood, memory and judgment normal.  Vitals reviewed.  LABORATORY DATA: Lab Results  Component Value Date   WBC 7.2 09/28/2022   HGB 13.1 09/28/2022   HCT 40.0 09/28/2022   MCV 84.0 09/28/2022   PLT 311 09/28/2022      Chemistry      Component Value Date/Time   NA 142 09/28/2022 1303   NA 145 09/08/2021 0000   K 4.0 09/28/2022 1303   CL 102 09/28/2022 1303   CO2 35 (H) 09/28/2022 1303   BUN 12 09/28/2022 1303   BUN 36 (H) 08/22/2021 1606   CREATININE 0.80 09/28/2022 1303   CREATININE 1.13 (H) 12/24/2018 1402   GLU 97 09/08/2021 0000      Component Value Date/Time   CALCIUM 10.2 09/28/2022 1303   ALKPHOS 71 09/28/2022 1303   AST 20 09/28/2022 1303   ALT 18  09/28/2022 1303   BILITOT 0.5 09/28/2022 1303       RADIOGRAPHIC STUDIES:  US BIOPSY (LIVER)  Result Date: 09/20/2022 INDICATION: No known primary, now with multiple hypermetabolic liver lesions worrisome for metastatic disease. Please perform ultrasound-guided biopsy for tissue diagnostic purposes. EXAM: ULTRASOUND GUIDED LIVER LESION BIOPSY COMPARISON:  PET-CT-08/21/2022 MEDICATIONS: None ANESTHESIA/SEDATION: Moderate (conscious) sedation was employed during this procedure as administered by the Interventional Radiology  RN. A total of Versed 1.5 mg and Fentanyl 100 mcg was administered intravenously. Moderate Sedation Time: 12 minutes. The patient's level of consciousness and vital signs were monitored continuously by radiology nursing throughout the procedure under my direct supervision. COMPLICATIONS: None immediate. PROCEDURE: Informed written consent was obtained from the patient after a discussion of the risks, benefits and alternatives to treatment. The patient understands and consents the procedure. A timeout was performed prior to the initiation of the procedure. Ultrasound scanning was performed of the right upper abdominal quadrant demonstrates multiple hypoechoic lesions and masses scattered throughout both the right and left lobes of the liver. A dominant at least 4.6 x 3.2 cm hypoechoic mass within the lateral segment of the left lobe of the liver was targeted for biopsy given location and sonographic window (image 15). The procedure was planned. The midline of the abdomen was prepped and draped in the usual sterile fashion. The overlying soft tissues were anesthetized with 1% lidocaine with epinephrine. A 17 gauge, 6.8 cm co-axial needle was advanced into a peripheral aspect of the lesion. This was followed by 5 core biopsies with an 18 gauge core device under direct ultrasound guidance. The coaxial needle tract was embolized with a small amount of Gel-Foam slurry and superficial hemostasis  was obtained with manual compression. Post procedural scanning was negative for definitive area of hemorrhage or additional complication. A dressing was placed. The patient tolerated the procedure well without immediate post procedural complication. IMPRESSION: Technically successful ultrasound guided core needle biopsy of indeterminate hypoechoic mass within the lateral segment of the left lobe of the liver. Electronically Signed   By: Simonne Come M.D.   On: 09/20/2022 16:08     ASSESSMENT/PLAN:  This is a very pleasant 57 year old African-American female referred to clinic for what is felt to be stage IV non-small cell lung cancer, adenocarcinoma (T2a, N3, M1c).  She presented with bilateral lung nodules, thoracic adenopathy and bilobar liver lesions.  She was diagnosed in May 2024.  ***Brain MRI.  Her PD-L1 expression 0%.  She does not have any actionable mutations by [employer].  Therefore, the patient is going to undergo palliative systemic chemotherapy with carboplatin for an AUC 5 and Alimta 500 mg/m and Keytruda 200 mg IV every 3 weeks.  She is expected to start her first dose of treatment today.  The patient was seen with Dr. Arbutus Ped today.  Labs reviewed.  Dr. Arbutus Ped explained that the patient does not have any actionable mutations.  She will proceed with cycle #1 today's schedule.  We will see her back for follow-up visit in 1 week to manage any adverse side effects of treatment.  Brain MRI  The patient was advised to call immediately if she has any concerning symptoms in the interval. The patient voices understanding of current disease status and treatment options and is in agreement with the current care plan. All questions were answered. The patient knows to call the clinic with any problems, questions or concerns. We can certainly see the patient much sooner if necessary           No orders of the defined types were placed in this encounter.    I spent {CHL ONC TIME  VISIT - NWGNF:[id number]} counseling the patient face to face. The total time spent in the appointment was {CHL ONC TIME VISIT - IONGE:[id number]}.  Jackelynn Hosie L Aunesti Pellegrino, PA-C 10/09/22

## 2022-10-09 NOTE — Telephone Encounter (Signed)
Patient aware of changed appointment times/dates

## 2022-10-10 ENCOUNTER — Encounter (HOSPITAL_COMMUNITY): Payer: Self-pay | Admitting: Psychiatry

## 2022-10-10 ENCOUNTER — Encounter (HOSPITAL_COMMUNITY): Payer: Self-pay

## 2022-10-10 ENCOUNTER — Telehealth (HOSPITAL_BASED_OUTPATIENT_CLINIC_OR_DEPARTMENT_OTHER): Payer: BC Managed Care – PPO | Admitting: Psychiatry

## 2022-10-10 ENCOUNTER — Inpatient Hospital Stay (HOSPITAL_BASED_OUTPATIENT_CLINIC_OR_DEPARTMENT_OTHER): Payer: BC Managed Care – PPO | Admitting: Physician Assistant

## 2022-10-10 ENCOUNTER — Inpatient Hospital Stay: Payer: BC Managed Care – PPO

## 2022-10-10 ENCOUNTER — Telehealth (HOSPITAL_COMMUNITY): Payer: Medicare Other | Admitting: Psychiatry

## 2022-10-10 ENCOUNTER — Other Ambulatory Visit: Payer: Self-pay

## 2022-10-10 VITALS — Wt 308.0 lb

## 2022-10-10 VITALS — BP 134/72 | HR 74

## 2022-10-10 VITALS — BP 115/57 | HR 73 | Wt 308.5 lb

## 2022-10-10 DIAGNOSIS — R634 Abnormal weight loss: Secondary | ICD-10-CM | POA: Diagnosis not present

## 2022-10-10 DIAGNOSIS — R251 Tremor, unspecified: Secondary | ICD-10-CM | POA: Diagnosis not present

## 2022-10-10 DIAGNOSIS — F419 Anxiety disorder, unspecified: Secondary | ICD-10-CM | POA: Diagnosis not present

## 2022-10-10 DIAGNOSIS — Z5111 Encounter for antineoplastic chemotherapy: Secondary | ICD-10-CM | POA: Diagnosis present

## 2022-10-10 DIAGNOSIS — C3490 Malignant neoplasm of unspecified part of unspecified bronchus or lung: Secondary | ICD-10-CM | POA: Diagnosis present

## 2022-10-10 DIAGNOSIS — F319 Bipolar disorder, unspecified: Secondary | ICD-10-CM

## 2022-10-10 DIAGNOSIS — C787 Secondary malignant neoplasm of liver and intrahepatic bile duct: Secondary | ICD-10-CM | POA: Diagnosis not present

## 2022-10-10 DIAGNOSIS — Z79899 Other long term (current) drug therapy: Secondary | ICD-10-CM | POA: Diagnosis not present

## 2022-10-10 DIAGNOSIS — C3492 Malignant neoplasm of unspecified part of left bronchus or lung: Secondary | ICD-10-CM | POA: Diagnosis not present

## 2022-10-10 DIAGNOSIS — Z79620 Long term (current) use of immunosuppressive biologic: Secondary | ICD-10-CM | POA: Diagnosis not present

## 2022-10-10 DIAGNOSIS — Z5112 Encounter for antineoplastic immunotherapy: Secondary | ICD-10-CM

## 2022-10-10 DIAGNOSIS — R059 Cough, unspecified: Secondary | ICD-10-CM | POA: Diagnosis not present

## 2022-10-10 LAB — CMP (CANCER CENTER ONLY)
ALT: 18 U/L (ref 0–44)
AST: 20 U/L (ref 15–41)
Albumin: 4 g/dL (ref 3.5–5.0)
Alkaline Phosphatase: 74 U/L (ref 38–126)
Anion gap: 7 (ref 5–15)
BUN: 12 mg/dL (ref 6–20)
CO2: 29 mmol/L (ref 22–32)
Calcium: 9.5 mg/dL (ref 8.9–10.3)
Chloride: 105 mmol/L (ref 98–111)
Creatinine: 0.88 mg/dL (ref 0.44–1.00)
GFR, Estimated: >60 mL/min (ref >60)
Glucose, Bld: 133 mg/dL — ABNORMAL HIGH (ref 70–99)
Potassium: 3.7 mmol/L (ref 3.5–5.1)
Sodium: 141 mmol/L (ref 135–145)
Total Bilirubin: 0.6 mg/dL (ref 0.3–1.2)
Total Protein: 6.9 g/dL (ref 6.5–8.1)

## 2022-10-10 LAB — CBC WITH DIFFERENTIAL (CANCER CENTER ONLY)
Abs Immature Granulocytes: 0.02 10*3/uL (ref 0.00–0.07)
Basophils Absolute: 0 10*3/uL (ref 0.0–0.1)
Basophils Relative: 1 %
Eosinophils Absolute: 0.1 10*3/uL (ref 0.0–0.5)
Eosinophils Relative: 2 %
HCT: 40.4 % (ref 36.0–46.0)
Hemoglobin: 13.2 g/dL (ref 12.0–15.0)
Immature Granulocytes: 0 %
Lymphocytes Relative: 28 %
Lymphs Abs: 1.7 10*3/uL (ref 0.7–4.0)
MCH: 27.4 pg (ref 26.0–34.0)
MCHC: 32.7 g/dL (ref 30.0–36.0)
MCV: 84 fL (ref 80.0–100.0)
Monocytes Absolute: 0.4 10*3/uL (ref 0.1–1.0)
Monocytes Relative: 7 %
Neutro Abs: 3.9 10*3/uL (ref 1.7–7.7)
Neutrophils Relative %: 62 %
Platelet Count: 313 10*3/uL (ref 150–400)
RBC: 4.81 MIL/uL (ref 3.87–5.11)
RDW: 14.4 % (ref 11.5–15.5)
WBC Count: 6.2 10*3/uL (ref 4.0–10.5)
nRBC: 0 % (ref 0.0–0.2)

## 2022-10-10 LAB — TSH: TSH: 3.15 u[IU]/mL (ref 0.350–4.500)

## 2022-10-10 MED ORDER — SODIUM CHLORIDE 0.9 % IV SOLN
200.0000 mg | Freq: Once | INTRAVENOUS | Status: AC
  Administered 2022-10-10: 200 mg via INTRAVENOUS
  Filled 2022-10-10: qty 200

## 2022-10-10 MED ORDER — SODIUM CHLORIDE 0.9 % IV SOLN
10.0000 mg | Freq: Once | INTRAVENOUS | Status: AC
  Administered 2022-10-10: 10 mg via INTRAVENOUS
  Filled 2022-10-10: qty 10

## 2022-10-10 MED ORDER — LURASIDONE HCL 40 MG PO TABS
40.0000 mg | ORAL_TABLET | Freq: Every day | ORAL | 2 refills | Status: DC
Start: 2022-10-10 — End: 2023-01-09

## 2022-10-10 MED ORDER — SODIUM CHLORIDE 0.9 % IV SOLN
500.0000 mg/m2 | Freq: Once | INTRAVENOUS | Status: AC
  Administered 2022-10-10: 1200 mg via INTRAVENOUS
  Filled 2022-10-10: qty 40

## 2022-10-10 MED ORDER — SODIUM CHLORIDE 0.9 % IV SOLN: Freq: Once | INTRAVENOUS | Status: AC

## 2022-10-10 MED ORDER — SODIUM CHLORIDE 0.9 % IV SOLN
150.0000 mg | Freq: Once | INTRAVENOUS | Status: AC
  Administered 2022-10-10: 150 mg via INTRAVENOUS
  Filled 2022-10-10: qty 150

## 2022-10-10 MED ORDER — PALONOSETRON HCL INJECTION 0.25 MG/5ML
0.2500 mg | Freq: Once | INTRAVENOUS | Status: AC
  Administered 2022-10-10: 0.25 mg via INTRAVENOUS
  Filled 2022-10-10: qty 5

## 2022-10-10 MED ORDER — BENZTROPINE MESYLATE 0.5 MG PO TABS
0.5000 mg | ORAL_TABLET | Freq: Every day | ORAL | 2 refills | Status: DC
Start: 2022-10-10 — End: 2023-01-09

## 2022-10-10 MED ORDER — SODIUM CHLORIDE 0.9 % IV SOLN
750.0000 mg | Freq: Once | INTRAVENOUS | Status: AC
  Administered 2022-10-10: 750 mg via INTRAVENOUS
  Filled 2022-10-10: qty 75

## 2022-10-10 NOTE — Progress Notes (Signed)
[facility] MD Virtual Progress Note   Patient Location: Home Provider Location: Home Office  I connect with patient by telephone and verified that I am speaking with correct person by using two identifiers. I discussed the limitations of evaluation and management by telemedicine and the availability of in person appointments. I also discussed with the patient that there may be a patient responsible charge related to this service. The patient expressed understanding and agreed to proceed.  Madison Palmer [phone] 57 y.o.  10/10/2022 10:09 AM  History of Present Illness:  Patient is evaluated by phone session.  She told recently diagnosed with stage IV lung cancer and today she is going to start chemotherapy.  She reported her spirits are high and she is ready for chemotherapy.  She has a good support from her friends and 67 year old son.  Patient told her son decided to take the Year and if he decided he will go back to school in 2025.  She reported medicine is working and she is sleeping okay.  She denies any feeling of hopelessness, crying spells or any suicidal thoughts.  She sleeps okay with the CPAP.  She denies any mania, psychosis, hallucination.  She lost more than 15 pounds in past 3 months.  She is hopeful about her cancer treatment.  She like to keep the current medication.  She is compliant with Latuda, Cogentin.  She denies any highs and lows, anger, homicidal thoughts.  She reported that stroke and helping the tremors.  Past Psychiatric History: H/O depression and inpatient at [facility] in May [id number] for delusion, psychosis and violent behavior. D/C on Depakote 500 mg BID, Latuda 60 mg daily and Trilafon 12 mg a day. No h/o suicidal attempt, nightmares, panic attack, OCD or substance use.      Outpatient Encounter Medications as of 10/10/2022  Medication Sig   acetaminophen (TYLENOL) 500 MG tablet Take 1,000 mg by mouth 3 (three) times daily as  needed for moderate pain or headache.   amLODipine (NORVASC) 10 MG tablet TAKE 1 TABLET BY MOUTH EVERY DAY   atorvastatin (LIPITOR) 20 MG tablet TAKE 1 TABLET(20 MG) BY MOUTH 3 TIMES A WEEK   benztropine (COGENTIN) 0.5 MG tablet Take 1 tablet (0.5 mg total) by mouth at bedtime.   cetirizine (ZYRTEC) 10 MG tablet TAKE 1 TABLET BY MOUTH EVERY DAY   Cholecalciferol (VITAMIN D3 MAXIMUM STRENGTH) 125 MCG (5000 UT) capsule Take 5,000 Units by mouth daily.   cinacalcet (SENSIPAR) 30 MG tablet Take 30 mg by mouth daily.   cloNIDine (CATAPRES) 0.1 MG tablet Take 0.1 mg by mouth 3 (three) times daily.   esomeprazole (NEXIUM) 40 MG capsule TAKE 1 CAPSULE BY MOUTH TWICE DAILY   fenofibrate (TRICOR) 145 MG tablet TAKE 1 TABLET(145 MG) BY MOUTH DAILY   folic acid (FOLVITE) 1 MG tablet Take 1 tablet (1 mg total) by mouth daily. (Patient not taking: Reported on 10/03/2022)   furosemide (LASIX) 40 MG tablet TAKE 1 TABLET BY MOUTH EVERY DAY   gabapentin (NEURONTIN) 100 MG capsule TAKE 2 CAPSULES(200 MG) BY MOUTH AT BEDTIME   labetalol (NORMODYNE) 300 MG tablet TAKE 1.5 TABLETS BY MOUTH TWICE DAILY   levothyroxine (SYNTHROID) 88 MCG tablet Take 88 mcg by mouth daily.   lisinopril (ZESTRIL) 40 MG tablet TAKE 1 TABLET BY MOUTH EVERY DAY   lurasidone (LATUDA) 40 MG TABS tablet Take 1 tablet (40 mg total) by mouth daily with breakfast.   oxyCODONE (OXY IR/ROXICODONE)  5 MG immediate release tablet 1 tablet 30 minutes before the MRI.  May repeat once if needed.   Polyethylene Glycol 3350 (MIRALAX PO) Take 1 Capful by mouth daily as needed (Constipation).   Potassium Chloride ER 20 MEQ TBCR TAKE 1 TABLET BY MOUTH EVERY DAY   prochlorperazine (COMPAZINE) 10 MG tablet Take 1 tablet (10 mg total) by mouth every 6 (six) hours as needed. (Patient not taking: Reported on 10/03/2022)   sucralfate (CARAFATE) 1 g tablet Take 1 tablet (1 g total) by mouth 3 (three) times daily before meals. (Patient not taking: Reported on 10/04/2022)    TART CHERRY PO Take 1 tablet by mouth daily.   No facility-administered encounter medications on file as of 10/10/2022.    Recent Results (from the past 2160 hour(s))  Comprehensive metabolic panel     Status: Abnormal   Collection Time: 07/13/22 12:07 PM  Result Value Ref Range   Sodium 142 135 - 145 mEq/L   Potassium 4.1 3.5 - 5.1 mEq/L   Chloride 101 96 - 112 mEq/L   CO2 31 19 - 32 mEq/L   Glucose, Bld 112 (H) 70 - 99 mg/dL   BUN 14 6 - 23 mg/dL   Creatinine, Ser 9.52 0.40 - 1.20 mg/dL   Total Bilirubin 0.7 0.2 - 1.2 mg/dL   Alkaline Phosphatase 74 39 - 117 U/L   AST 18 0 - 37 U/L   ALT 19 0 - 35 U/L   Total Protein 6.9 6.0 - 8.3 g/dL   Albumin 3.9 3.5 - 5.2 g/dL   GFR 84.13 >24.40 mL/min    Comment: Calculated using the CKD-EPI Creatinine Equation (2021)   Calcium 10.0 8.4 - 10.5 mg/dL  CBC     Status: Abnormal   Collection Time: 08/15/22  5:04 PM  Result Value Ref Range   WBC 8.3 4.0 - 10.5 K/uL   RBC 5.02 3.87 - 5.11 Mil/uL   Platelets 363.0 150.0 - 400.0 K/uL   Hemoglobin 13.6 12.0 - 15.0 g/dL   HCT 10.2 72.5 - 36.6 %   MCV 82.3 78.0 - 100.0 fl   MCHC 32.8 30.0 - 36.0 g/dL   RDW 44.0 (H) 34.7 - 42.5 %  TSH     Status: None   Collection Time: 08/15/22  5:04 PM  Result Value Ref Range   TSH 1.66 0.35 - 5.50 uIU/mL  Lipase     Status: None   Collection Time: 08/15/22  5:04 PM  Result Value Ref Range   Lipase 52.0 11.0 - 59.0 U/L  Basic Metabolic Panel (BMET)     Status: Abnormal   Collection Time: 08/15/22  5:04 PM  Result Value Ref Range   Sodium 140 135 - 145 mEq/L   Potassium 3.8 3.5 - 5.1 mEq/L   Chloride 100 96 - 112 mEq/L   CO2 31 19 - 32 mEq/L   Glucose, Bld 103 (H) 70 - 99 mg/dL   BUN 15 6 - 23 mg/dL   Creatinine, Ser 9.56 0.40 - 1.20 mg/dL   GFR 38.75 >64.33 mL/min    Comment: Calculated using the CKD-EPI Creatinine Equation (2021)   Calcium 9.7 8.4 - 10.5 mg/dL  Amylase     Status: None   Collection Time: 08/15/22  5:04 PM  Result Value Ref  Range   Amylase 55 27 - 131 U/L  Glucose, capillary     Status: Abnormal   Collection Time: 08/21/22  3:21 PM  Result Value Ref Range   Glucose-Capillary  103 (H) 70 - 99 mg/dL    Comment: Glucose reference range applies only to samples taken after fasting for at least 8 hours.  CBC upon arrival     Status: None   Collection Time: 09/20/22 11:23 AM  Result Value Ref Range   WBC 6.1 4.0 - 10.5 K/uL   RBC 4.73 3.87 - 5.11 MIL/uL   Hemoglobin 12.7 12.0 - 15.0 g/dL   HCT 16.1 09.6 - 04.5 %   MCV 85.4 80.0 - 100.0 fL   MCH 26.8 26.0 - 34.0 pg   MCHC 31.4 30.0 - 36.0 g/dL   RDW 40.9 81.1 - 91.4 %   Platelets 297 150 - 400 K/uL   nRBC 0.0 0.0 - 0.2 %    Comment: Performed at [facility], [address] [place], [place] [address]  Protime-INR upon arrival     Status: None   Collection Time: 09/20/22 11:23 AM  Result Value Ref Range   Prothrombin Time 12.9 11.4 - 15.2 seconds   INR 1.0 0.8 - 1.2    Comment: (NOTE) INR goal varies based on device and disease states. Performed at [facility], [address], [place], [place] [address]   Surgical pathology     Status: None   Collection Time: 09/20/22  2:29 PM  Result Value Ref Range   SURGICAL PATHOLOGY      SURGICAL PATHOLOGY CASE: [id number] PATIENT: Madison Palmer Surgical Pathology Report     Clinical History: no known primary, now with multiple liver lesions and masses worrisome for mets (cm)     FINAL MICROSCOPIC DIAGNOSIS:  A. LIVER, LEFT LOBE INFILTRATIVE MASS, NEEDLE CORE BIOPSY:      Metastatic carcinoma, suggestive of lung primary.      See comment.  COMMENT:  The specimen demonstrates an epithelioid malignancy forming small nests and solid sheets. Immunohistochemical stains show the tumor cells are positive for TTF-1 and are negative for CDX-2, GATA-3. PAX8, and SOX10. The immunoprofile is suggestive of lung primary. Radiological correlation is necessary to determine the site of  primary.  Controls worked appropriately.   GROSS DESCRIPTION:  Received in formalin are 5 cores of soft tan-white tissue measuring 2.3 to 2.7 cm in length and each 0.1 cm in diameter.  The specimen is entirely submitted in 2 cassettes.  [facility] 09/20/2022)   Final Dia gnosis performed by Lance Coon, MD.   Electronically signed 09/22/2022 Technical and / or Professional components performed at [facility], [address], [place], [place] [address].  Immunohistochemistry Technical component (if applicable) was performed at [facility]. [address], [place], [place] [address].   IMMUNOHISTOCHEMISTRY DISCLAIMER (if applicable): Some of these immunohistochemical stains may have been developed and the performance characteristics determine by [facility]. Some may not have been cleared or approved by the U.S. Food and Drug Administration. The FDA has determined that such clearance or approval is not necessary. This test is used for clinical purposes. It should not be regarded as investigational or for research. This laboratory is certified under the Clinical Laboratory Improvement Amendments of 1988 (CLIA-88) as qualified to perform high complexity clinical laboratory testing.  The  controls stained appropriately.   Surgical pathology     Status: None   Collection Time: 09/27/22  8:59 AM  Result Value Ref Range   SURGICAL PATHOLOGY      SURGICAL PATHOLOGY CASE: [id number] PATIENT: Madison Palmer Surgical Pathology Report  Clinical History: GERD, change in bowel habits, fecal urgency, epigastric pain, dysphagia, R/O H. Pylori, R/O enteropathy, R/O dysplasia, R/O microscopic colitis (cm)     FINAL MICROSCOPIC DIAGNOSIS:  A. STOMACH, BIOPSY: Unremarkable antral and oxyntic mucosa. Negative for Helicobacter pylori.  B. DUODENUM, BIOPSY: Duodenal mucosa with normal villous architecture. No villous atrophy or  increased intraepithelial lymphocytes.  C. STOMACH, POLYPECTOMY: Hyperplastic gastric polyp. Negative for dysplasia.  D. COLON, BIOPSY: Colonic mucosa with no significant pathologic changes. No microscopic colitis, active inflammation or granulomas.  E. COLON, ASCENDING, TRANSVERSE, DESCENDING AND RECTAL, POLYPECTOMY: Tubular adenoma (s) without high grade dysplasia. Hyperplastic polyp (s).    GROSS DESCRIPTION:  A.  Received in formalin are tan, soft tissue fragments that  are submitted in toto. Number: 7 size: 0.1 to 0.3 cm blocks: 1  B.  Received in formalin are tan, soft tissue fragments that are submitted in toto. Number: 4 size: 0.1 to 0.2 cm blocks: 1  C.  Received in formalin are tan, soft tissue fragments that are submitted in toto. Number: 2 size: 0.2 cm, each blocks: 1  D.  Received in formalin are tan, soft tissue fragments that are submitted in toto. Number: Multiple size: 0.1 to 0.3 cm blocks: 1  E.  Received in formalin are multiple tan, glistening fragments of soft tissue measuring 2.2 x 1.4 x 0.2 cm in aggregate.  The specimen is entirely submitted in 1 block. (KW, 09/27/2022)   Final Diagnosis performed by Jimmy Picket, MD.   Electronically signed 09/28/2022 Technical and / or Professional components performed at [facility]. [facility], [address] [place], [place] [address].  Immunohistochemistry Technical component (if applicable) was performed at [facility]. [address], [place], [place] [address].   IMMUNOHISTOCHEMISTRY DISCLAIMER (if applicable): Some of these immunohistochemical stains may have been developed and the performance characteristics determine by [facility]. Some may not have been cleared or approved by the U.S. Food and Drug Administration. The FDA has determined that such clearance or approval is not necessary. This test is used for clinical purposes. It should not be regarded  as investigational or for research. This laboratory is certified under the Clinical Laboratory Improvement Amendments of 1988 (CLIA-88) as qualified to perform high complexity clinical laboratory testing.  The controls stained appropriately.   IHC stains are performed on formalin fixed, paraffin embedded tissue using a 3,3"diaminobenzidine (DAB) chromogen and Leica Bond Autostainer System. The staining intensity of the nucleus is score manually and is reported as the percentage of tumor cell nuclei demonstrating specific nuclear stainin g. The specimens are fixed in 10% Neutral Formalin for at least 6 hours and up to 72hrs. These tests are validated on decalcified tissue. Results should be interpreted with caution given the possibility of false negative results on decalcified specimens. Antibody Clones are as follows ER-clone [id number], PR-clone 16, Ki67- clone MM1. Some of these immunohistochemical stains may have been developed and the performance characteristics determined by [facility].   CMP (Cancer Center only)     Status: Abnormal   Collection Time: 09/28/22  1:03 PM  Result Value Ref Range   Sodium 142 135 - 145 mmol/L   Potassium 4.0 3.5 - 5.1 mmol/L   Chloride 102 98 - 111 mmol/L   CO2 35 (H) 22 - 32 mmol/L   Glucose, Bld 137 (H) 70 - 99 mg/dL    Comment: Glucose reference range applies only to samples taken after fasting for at least 8  hours.   BUN 12 6 - 20 mg/dL   Creatinine 1.30 8.65 - 1.00 mg/dL   Calcium 78.4 8.9 - 69.6 mg/dL   Total Protein 7.2 6.5 - 8.1 g/dL   Albumin 4.1 3.5 - 5.0 g/dL   AST 20 15 - 41 U/L   ALT 18 0 - 44 U/L   Alkaline Phosphatase 71 38 - 126 U/L   Total Bilirubin 0.5 0.3 - 1.2 mg/dL   GFR, Estimated >29 >52 mL/min    Comment: (NOTE) Calculated using the CKD-EPI Creatinine Equation (2021)    Anion gap 5 5 - 15    Comment: Performed at [facility], [address] [place], [place] [address]  CBC with Differential  (Cancer Center Only)     Status: None   Collection Time: 09/28/22  1:03 PM  Result Value Ref Range   WBC Count 7.2 4.0 - 10.5 K/uL   RBC 4.76 3.87 - 5.11 MIL/uL   Hemoglobin 13.1 12.0 - 15.0 g/dL   HCT 44.0 10.2 - 72.5 %   MCV 84.0 80.0 - 100.0 fL   MCH 27.5 26.0 - 34.0 pg   MCHC 32.8 30.0 - 36.0 g/dL   RDW 36.6 44.0 - 34.7 %   Platelet Count 311 150 - 400 K/uL   nRBC 0.0 0.0 - 0.2 %   Neutrophils Relative % 72 %   Neutro Abs 5.1 1.7 - 7.7 K/uL   Lymphocytes Relative 20 %   Lymphs Abs 1.5 0.7 - 4.0 K/uL   Monocytes Relative 7 %   Monocytes Absolute 0.5 0.1 - 1.0 K/uL   Eosinophils Relative 0 %   Eosinophils Absolute 0.0 0.0 - 0.5 K/uL   Basophils Relative 0 %   Basophils Absolute 0.0 0.0 - 0.1 K/uL   Immature Granulocytes 1 %   Abs Immature Granulocytes 0.04 0.00 - 0.07 K/uL    Comment: Performed at [facility], [address], [place], [place] [address]  Guardant [address]     Status: None   Collection Time: 09/28/22  2:44 PM  Result Value Ref Range   Guard[address] SEE SEPARATE REPORT     Comment: Performed at [facility], [address], [place], [place] [address]  CBC with Differential (Cancer Center Only)     Status: None   Collection Time: 10/10/22  9:40 AM  Result Value Ref Range   WBC Count 6.2 4.0 - 10.5 K/uL   RBC 4.81 3.87 - 5.11 MIL/uL   Hemoglobin 13.2 12.0 - 15.0 g/dL   HCT 64.3 32.9 - 51.8 %   MCV 84.0 80.0 - 100.0 fL   MCH 27.4 26.0 - 34.0 pg   MCHC 32.7 30.0 - 36.0 g/dL   RDW 84.1 66.0 - 63.0 %   Platelet Count 313 150 - 400 K/uL   nRBC 0.0 0.0 - 0.2 %   Neutrophils Relative % 62 %   Neutro Abs 3.9 1.7 - 7.7 K/uL   Lymphocytes Relative 28 %   Lymphs Abs 1.7 0.7 - 4.0 K/uL   Monocytes Relative 7 %   Monocytes Absolute 0.4 0.1 - 1.0 K/uL   Eosinophils Relative 2 %   Eosinophils Absolute 0.1 0.0 - 0.5 K/uL   Basophils Relative 1 %   Basophils Absolute 0.0 0.0 - 0.1 K/uL   Immature Granulocytes 0 %   Abs  Immature Granulocytes 0.02 0.00 - 0.07 K/uL    Comment: Performed at [facility]  [facility], [address] [place], [place] [address]     Psychiatric Specialty Exam: Physical Exam  Review of Systems  Weight (!) 308 lb (139.7 kg), last menstrual period 05/02/2017.There is no height or weight on file to calculate BMI.  General Appearance: NA  Eye Contact:  NA  Speech:  Slow  Volume:  Decreased  Mood:  Anxious  Affect:  NA  Thought Process:  Goal Directed  Orientation:  Full (Time, Place, and Person)  Thought Content:  WDL  Suicidal Thoughts:  No  Homicidal Thoughts:  No  Memory:  Immediate;   Good Recent;   Good Remote;   Good  Judgement:  Good  Insight:  Good  Psychomotor Activity:  Normal  Concentration:  Concentration: Good and Attention Span: Good  Recall:  Good  Fund of Knowledge:  Good  Language:  Good  Akathisia:  No  Handed:  Right  AIMS (if indicated):     Assets:  Communication Skills Desire for Improvement Housing Resilience Social Support Transportation  ADL's:  Intact  Cognition:  WNL  Sleep:  ok     Assessment/Plan: Bipolar 1 disorder (HCC) - Plan: benztropine (COGENTIN) 0.5 MG tablet, lurasidone (LATUDA) 40 MG TABS tablet  Tremor - Plan: benztropine (COGENTIN) 0.5 MG tablet  Anxiety - Plan: lurasidone (LATUDA) 40 MG TABS tablet  I review blood work results and recent collateral information from other providers.  She is given the diagnosis of stage IV lung cancer and today she is going to start first chemotherapy treatment.  Reassurance given.  She is n good spirits and hopeful.  She had a good support from her friends and her son.  She does not want to change the medication.  Reassurance given.  Offered therapy but at this time patient feel no major impact in her mental health.  We will continue Cogentin 0.5 mg at bedtime and Latuda 40 mg daily.  Recommend to call us back if she has any question or any concern.  Follow-up in 3  months.   Follow Up Instructions:     I discussed the assessment and treatment plan with the patient. The patient was provided an opportunity to ask questions and all were answered. The patient agreed with the plan and demonstrated an understanding of the instructions.   The patient was advised to call back or seek an in-person evaluation if the symptoms worsen or if the condition fails to improve as anticipated.    Collaboration of Care: Other provider involved in patient's care AEB notes are available in epic to review.  Patient/Guardian was advised Release of Information must be obtained prior to any record release in order to collaborate their care with an outside provider. Patient/Guardian was advised if they have not already done so to contact the registration department to sign all necessary forms in order for [place] to release information regarding their care.   Consent: Patient/Guardian gives verbal consent for treatment and assignment of benefits for services provided during this visit. Patient/Guardian expressed understanding and agreed to proceed.     I provided 22 minutes of non face to face time during this encounter.  Note: This document was prepared by [employer] voice dictation technology and any errors that results from this process are unintentional.    Cleotis Nipper, MD 10/10/2022

## 2022-10-10 NOTE — Patient Instructions (Signed)
[facility]  Discharge Instructions: Thank you for choosing [facility] to provide your oncology and hematology care.   If you have a lab appointment with the Cancer Center, please go directly to the Cancer Center and check in at the registration area.   Wear comfortable clothing and clothing appropriate for easy access to any Portacath or PICC line.   We strive to give you quality time with your provider. You may need to reschedule your appointment if you arrive late (15 or more minutes).  Arriving late affects you and other patients whose appointments are after yours.  Also, if you miss three or more appointments without notifying the office, you may be dismissed from the clinic at the provider's discretion.      For prescription refill requests, have your pharmacy contact our office and allow 72 hours for refills to be completed.    Today you received the following chemotherapy and/or immunotherapy agents Carboplatin, Keytruda, Alimta      To help prevent nausea and vomiting after your treatment, we encourage you to take your nausea medication as directed.  BELOW ARE SYMPTOMS THAT SHOULD BE REPORTED IMMEDIATELY: *FEVER GREATER THAN 100.4 F (38 C) OR HIGHER *CHILLS OR SWEATING *NAUSEA AND VOMITING THAT IS NOT CONTROLLED WITH YOUR NAUSEA MEDICATION *UNUSUAL SHORTNESS OF BREATH *UNUSUAL BRUISING OR BLEEDING *URINARY PROBLEMS (pain or burning when urinating, or frequent urination) *BOWEL PROBLEMS (unusual diarrhea, constipation, pain near the anus) TENDERNESS IN MOUTH AND THROAT WITH OR WITHOUT PRESENCE OF ULCERS (sore throat, sores in mouth, or a toothache) UNUSUAL RASH, SWELLING OR PAIN  UNUSUAL VAGINAL DISCHARGE OR ITCHING   Items with * indicate a potential emergency and should be followed up as soon as possible or go to the Emergency Department if any problems should occur.  Please show the CHEMOTHERAPY ALERT CARD or IMMUNOTHERAPY  ALERT CARD at check-in to the Emergency Department and triage nurse.  Should you have questions after your visit or need to cancel or reschedule your appointment, please contact [facility]  Dept: [phone]  and follow the prompts.  Office hours are 8:00 a.m. to 4:30 p.m. Monday - Friday. Please note that voicemails left after 4:00 p.m. may not be returned until the following business day.  We are closed weekends and major holidays. You have access to a nurse at all times for urgent questions. Please call the main number to the clinic Dept: [phone] and follow the prompts.   For any non-urgent questions, you may also contact your provider using MyChart. We now offer e-Visits for anyone 30 and older to request care online for non-urgent symptoms. For details visit [email or web address].   Also download the MyChart app! Go to the app store, search "MyChart", open the app, select [facility] [place], and log in with your MyChart username and password.  Carboplatin Injection What is this medication? CARBOPLATIN (KAR boe pla tin) treats some types of cancer. It works by slowing down the growth of cancer cells. This medicine may be used for other purposes; ask your health care provider or pharmacist if you have questions. COMMON BRAND NAME(S): Paraplatin What should I tell my care team before I take this medication? They need to know if you have any of these conditions: Blood disorders Hearing problems Kidney disease Recent or ongoing radiation therapy An unusual or allergic reaction to carboplatin, cisplatin, other medications, foods, dyes, or preservatives Pregnant or trying to get pregnant Breast-feeding  How should I use this medication? This medication is injected into a vein. It is given by your care team in a hospital or clinic setting. Talk to your care team about the use of this medication in children. Special care may be needed. Overdosage: If you  think you have taken too much of this medicine contact a poison control center or emergency room at once. NOTE: This medicine is only for you. Do not share this medicine with others. What if I miss a dose? Keep appointments for follow-up doses. It is important not to miss your dose. Call your care team if you are unable to keep an appointment. What may interact with this medication? Medications for seizures Some antibiotics, such as amikacin, gentamicin, neomycin, streptomycin, tobramycin Vaccines This list may not describe all possible interactions. Give your health care provider a list of all the medicines, herbs, non-prescription drugs, or dietary supplements you use. Also tell them if you smoke, drink alcohol, or use illegal drugs. Some items may interact with your medicine. What should I watch for while using this medication? Your condition will be monitored carefully while you are receiving this medication. You may need blood work while taking this medication. This medication may make you feel generally unwell. This is not uncommon, as chemotherapy can affect healthy cells as well as cancer cells. Report any side effects. Continue your course of treatment even though you feel ill unless your care team tells you to stop. In some cases, you may be given additional medications to help with side effects. Follow all directions for their use. This medication may increase your risk of getting an infection. Call your care team for advice if you get a fever, chills, sore throat, or other symptoms of a cold or flu. Do not treat yourself. Try to avoid being around people who are sick. Avoid taking medications that contain aspirin, acetaminophen, ibuprofen, naproxen, or ketoprofen unless instructed by your care team. These medications may hide a fever. Be careful brushing or flossing your teeth or using a toothpick because you may get an infection or bleed more easily. If you have any dental work done, tell  your dentist you are receiving this medication. Talk to your care team if you wish to become pregnant or think you might be pregnant. This medication can cause serious birth defects. Talk to your care team about effective forms of contraception. Do not breast-feed while taking this medication. What side effects may I notice from receiving this medication? Side effects that you should report to your care team as soon as possible: Allergic reactions--skin rash, itching, hives, swelling of the face, lips, tongue, or throat Infection--fever, chills, cough, sore throat, wounds that don't heal, pain or trouble when passing urine, general feeling of discomfort or being unwell Low red blood cell level--unusual weakness or fatigue, dizziness, headache, trouble breathing Pain, tingling, or numbness in the hands or feet, muscle weakness, change in vision, confusion or trouble speaking, loss of balance or coordination, trouble walking, seizures Unusual bruising or bleeding Side effects that usually do not require medical attention (report to your care team if they continue or are bothersome): Hair loss Nausea Unusual weakness or fatigue Vomiting This list may not describe all possible side effects. Call your doctor for medical advice about side effects. You may report side effects to FDA at 1-800-FDA-1088. Where should I keep my medication? This medication is given in a hospital or clinic. It will not be stored at home. NOTE: This sheet is  a summary. It may not cover all possible information. If you have questions about this medicine, talk to your doctor, pharmacist, or health care provider.  2024 Elsevier/Gold Standard (2021-08-09 00:00:00) Pembrolizumab Injection What is this medication? PEMBROLIZUMAB (PEM broe LIZ ue mab) treats some types of cancer. It works by helping your immune system slow or stop the spread of cancer cells. It is a monoclonal antibody. This medicine may be used for other  purposes; ask your health care provider or pharmacist if you have questions. COMMON BRAND NAME(S): Keytruda What should I tell my care team before I take this medication? They need to know if you have any of these conditions: Allogeneic stem cell transplant (uses someone else's stem cells) Autoimmune diseases, such as Crohn disease, ulcerative colitis, lupus History of chest radiation Nervous system problems, such as Guillain-Barre syndrome, myasthenia gravis Organ transplant An unusual or allergic reaction to pembrolizumab, other medications, foods, dyes, or preservatives Pregnant or trying to get pregnant Breast-feeding How should I use this medication? This medication is injected into a vein. It is given by your care team in a hospital or clinic setting. A special MedGuide will be given to you before each treatment. Be sure to read this information carefully each time. Talk to your care team about the use of this medication in children. While it may be prescribed for children as young as 6 months for selected conditions, precautions do apply. Overdosage: If you think you have taken too much of this medicine contact a poison control center or emergency room at once. NOTE: This medicine is only for you. Do not share this medicine with others. What if I miss a dose? Keep appointments for follow-up doses. It is important not to miss your dose. Call your care team if you are unable to keep an appointment. What may interact with this medication? Interactions have not been studied. This list may not describe all possible interactions. Give your health care provider a list of all the medicines, herbs, non-prescription drugs, or dietary supplements you use. Also tell them if you smoke, drink alcohol, or use illegal drugs. Some items may interact with your medicine. What should I watch for while using this medication? Your condition will be monitored carefully while you are receiving this  medication. You may need blood work while taking this medication. This medication may cause serious skin reactions. They can happen weeks to months after starting the medication. Contact your care team right away if you notice fevers or flu-like symptoms with a rash. The rash may be red or purple and then turn into blisters or peeling of the skin. You may also notice a red rash with swelling of the face, lips, or lymph nodes in your neck or under your arms. Tell your care team right away if you have any change in your eyesight. Talk to your care team if you may be pregnant. Serious birth defects can occur if you take this medication during pregnancy and for 4 months after the last dose. You will need a negative pregnancy test before starting this medication. Contraception is recommended while taking this medication and for 4 months after the last dose. Your care team can help you find the option that works for you. Do not breastfeed while taking this medication and for 4 months after the last dose. What side effects may I notice from receiving this medication? Side effects that you should report to your care team as soon as possible: Allergic reactions--skin rash, itching, hives,  swelling of the face, lips, tongue, or throat Dry cough, shortness of breath or trouble breathing Eye pain, redness, irritation, or discharge with blurry or decreased vision Heart muscle inflammation--unusual weakness or fatigue, shortness of breath, chest pain, fast or irregular heartbeat, dizziness, swelling of the ankles, feet, or hands Hormone gland problems--headache, sensitivity to light, unusual weakness or fatigue, dizziness, fast or irregular heartbeat, increased sensitivity to cold or heat, excessive sweating, constipation, hair loss, increased thirst or amount of urine, tremors or shaking, irritability Infusion reactions--chest pain, shortness of breath or trouble breathing, feeling faint or lightheaded Kidney injury  (glomerulonephritis)--decrease in the amount of urine, red or dark brown urine, foamy or bubbly urine, swelling of the ankles, hands, or feet Liver injury--right upper belly pain, loss of appetite, nausea, light-colored stool, dark yellow or brown urine, yellowing skin or eyes, unusual weakness or fatigue Pain, tingling, or numbness in the hands or feet, muscle weakness, change in vision, confusion or trouble speaking, loss of balance or coordination, trouble walking, seizures Rash, fever, and swollen lymph nodes Redness, blistering, peeling, or loosening of the skin, including inside the mouth Sudden or severe stomach pain, bloody diarrhea, fever, nausea, vomiting Side effects that usually do not require medical attention (report to your care team if they continue or are bothersome): Bone, joint, or muscle pain Diarrhea Fatigue Loss of appetite Nausea Skin rash This list may not describe all possible side effects. Call your doctor for medical advice about side effects. You may report side effects to FDA at 1-800-FDA-1088. Where should I keep my medication? This medication is given in a hospital or clinic. It will not be stored at home. NOTE: This sheet is a summary. It may not cover all possible information. If you have questions about this medicine, talk to your doctor, pharmacist, or health care provider.  2024 Elsevier/Gold Standard (2021-08-30 00:00:00) Pemetrexed Injection What is this medication? PEMETREXED (PEM e TREX ed) treats some types of cancer. It works by slowing down the growth of cancer cells. This medicine may be used for other purposes; ask your health care provider or pharmacist if you have questions. COMMON BRAND NAME(S): Alimta, PEMFEXY What should I tell my care team before I take this medication? They need to know if you have any of these conditions: Infection, such as chickenpox, cold sores, or herpes Kidney disease Low blood cell levels (white cells, red cells,  and platelets) Lung or breathing disease, such as asthma Radiation therapy An unusual or allergic reaction to pemetrexed, other medications, foods, dyes, or preservatives If you or your partner are pregnant or trying to get pregnant Breast-feeding How should I use this medication? This medication is injected into a vein. It is given by your care team in a hospital or clinic setting. Talk to your care team about the use of this medication in children. Special care may be needed. Overdosage: If you think you have taken too much of this medicine contact a poison control center or emergency room at once. NOTE: This medicine is only for you. Do not share this medicine with others. What if I miss a dose? Keep appointments for follow-up doses. It is important not to miss your dose. Call your care team if you are unable to keep an appointment. What may interact with this medication? Do not take this medication with any of the following: Live virus vaccines This medication may also interact with the following: Ibuprofen This list may not describe all possible interactions. Give your health care provider  a list of all the medicines, herbs, non-prescription drugs, or dietary supplements you use. Also tell them if you smoke, drink alcohol, or use illegal drugs. Some items may interact with your medicine. What should I watch for while using this medication? Your condition will be monitored carefully while you are receiving this medication. This medication may make you feel generally unwell. This is not uncommon as chemotherapy can affect healthy cells as well as cancer cells. Report any side effects. Continue your course of treatment even though you feel ill unless your care team tells you to stop. This medication can cause serious side effects. To reduce the risk, your care team may give you other medications to take before receiving this one. Be sure to follow the directions from your care team. This  medication can cause a rash or redness in areas of the body that have previously had radiation therapy. If you have had radiation therapy, tell your care team if you notice a rash in this area. This medication may increase your risk of getting an infection. Call your care team for advice if you get a fever, chills, sore throat, or other symptoms of a cold or flu. Do not treat yourself. Try to avoid being around people who are sick. Be careful brushing or flossing your teeth or using a toothpick because you may get an infection or bleed more easily. If you have any dental work done, tell your dentist you are receiving this medication. Avoid taking medications that contain aspirin, acetaminophen, ibuprofen, naproxen, or ketoprofen unless instructed by your care team. These medications may hide a fever. Check with your care team if you have severe diarrhea, nausea, and vomiting, or if you sweat a lot. The loss of too much body fluid may make it dangerous for you to take this medication. Talk to your care team if you or your partner wish to become pregnant or think either of you might be pregnant. This medication can cause serious birth defects if taken during pregnancy and for 6 months after the last dose. A negative pregnancy test is required before starting this medication. A reliable form of contraception is recommended while taking this medication and for 6 months after the last dose. Talk to your care team about reliable forms of contraception. Do not father a child while taking this medication and for 3 months after the last dose. Use a condom while having sex during this time period. Do not breastfeed while taking this medication and for 1 week after the last dose. This medication may cause infertility. Talk to your care team if you are concerned about your fertility. What side effects may I notice from receiving this medication? Side effects that you should report to your care team as soon as  possible: Allergic reactions--skin rash, itching, hives, swelling of the face, lips, tongue, or throat Dry cough, shortness of breath or trouble breathing Infection--fever, chills, cough, sore throat, wounds that don't heal, pain or trouble when passing urine, general feeling of discomfort or being unwell Kidney injury--decrease in the amount of urine, swelling of the ankles, hands, or feet Low red blood cell level--unusual weakness or fatigue, dizziness, headache, trouble breathing Redness, blistering, peeling, or loosening of the skin, including inside the mouth Unusual bruising or bleeding Side effects that usually do not require medical attention (report to your care team if they continue or are bothersome): Fatigue Loss of appetite Nausea Vomiting This list may not describe all possible side effects. Call your doctor for  medical advice about side effects. You may report side effects to FDA at 1-800-FDA-1088. Where should I keep my medication? This medication is given in a hospital or clinic. It will not be stored at home. NOTE: This sheet is a summary. It may not cover all possible information. If you have questions about this medicine, talk to your doctor, pharmacist, or health care provider.  2024 Elsevier/Gold Standard (2021-08-23 00:00:00)

## 2022-10-12 LAB — T4: T4, Total: 12.3 ug/dL — ABNORMAL HIGH (ref 4.5–12.0)

## 2022-10-12 NOTE — Progress Notes (Signed)
[facility] OFFICE PROGRESS NOTE  [name], [name], NP [address] [place] [address]  DIAGNOSIS: Stage IV non-small cell lung cancer, adenocarcinoma (T2a, N3, M1c).  She presented with bilateral lung nodules, thoracic adenopathy and bilobar liver lesions.  She was diagnosed in May 2024.     PDL1: 0   Guardant 360: Negative for any actionable mutations   Foundation One: No actionable mutations   PRIOR THERAPY: None  CURRENT THERAPY: Palliative systemic chemotherapy and immunotherapy with carboplatin for an AUC of 5, alimta 500 mg/m2, and Keytruda 200 mg IV every 3 weeks. First dose on 10/10/22. Status post 1 cycle.   INTERVAL HISTORY: Madison Palmer 57 y.o. female returns to the clinic today for a follow-up visit accompanied by her husband and son.  The patient is a never smoker and was unfortunately recently diagnosed with metastatic lung cancer.  She is currently undergoing palliative systemic chemotherapy and immunotherapy and she is status post her first cycle of treatment last week and she tolerated well without any adverse side effects.  He did call the triage line after hours over the weekend.  She has a history of getting a recurrent sore on her buttock.  In the past, she was told this was herpes.  At some point in time, provider stated this was not related to herpes and not to take any more Valtrex.  Therefore, whenever she gets recurrent episodes, she uses bacitracin with improvement in her symptoms.  She started using bacitracin.    Today, she denies any changes in her health.  She denies any fever, chills, or night sweats.  She had been losing weight prior to her diagnosis.  She follows with the weight management clinic.  We discussed nutrition and healthy weight loss at her appointment today while undergoing cancer treatment.  She states that she was discontinued on her metformin.  She also states that she typically only eats 2 meals per day.  She  denies any shortness of breath.  She states that she randomly may have a mild cough but it is nothing alarming for her.  He states the cough is not sustained.  She has not needed to take any over-the-counter cough medicine.  The cough is dry.  She denies any other symptoms or signs of infection with her cough.  She denies any nausea, vomiting, diarrhea, or constipation.  It is typical for her to have a bowel movement every 3 days.  She denies any hard stools.  She denies any headache or visual changes.  She is here today for evaluation and repeat blood work and to manage any adverse side effects of treatment.     MEDICAL HISTORY: Past Medical History:  Diagnosis Date   Acute on chronic respiratory failure with hypoxia and hypercapnia (HCC) 09/15/2018   Anemia    Anxiety    Arrhythmia    tachycardia   Arthritis    Bipolar 1 disorder (HCC) 09/12/2018   Bipolar I disorder, current or most recent episode manic, with psychotic features (HCC)    Brief psychotic disorder (HCC) 08/22/2018   Chronic respiratory failure with hypoxia (HCC) 05/10/2018   Formatting of this note might be different from the original. Last Assessment & Plan:  Continue 1 L continuous on exertion and during sleep. On her next visit we will check OSA on CPAP/room air to decide whether she needs to take oxygen along with her on her cruise in May   Depression    Diverticulitis  Generalized edema 03/12/2018   GERD (gastroesophageal reflux disease)    Glaucoma    Hyperlipidemia    Hyperparathyroidism (HCC)    Hypertension    Inflammatory polyps of colon (HCC)    Leg swelling 01/14/2019   LVH (left ventricular hypertrophy)    Lymphedema    Morbid (severe) obesity due to excess calories (HCC) 05/11/2017   PCOS (polycystic ovarian syndrome)    Prediabetes    Recurrent UTI    Sleep apnea    CPAP   Thyroid disease    Vitamin D deficiency     ALLERGIES:  is allergic to other, fentanyl, levofloxacin, midazolam, pollen  extract, atorvastatin, hydralazine hcl, and rosuvastatin.  MEDICATIONS:  Current Outpatient Medications  Medication Sig Dispense Refill   acetaminophen (TYLENOL) 500 MG tablet Take 1,000 mg by mouth 3 (three) times daily as needed for moderate pain or headache.     amLODipine (NORVASC) 10 MG tablet TAKE 1 TABLET BY MOUTH EVERY DAY 90 tablet 3   atorvastatin (LIPITOR) 20 MG tablet TAKE 1 TABLET(20 MG) BY MOUTH 3 TIMES A WEEK 12 tablet 5   benztropine (COGENTIN) 0.5 MG tablet Take 1 tablet (0.5 mg total) by mouth at bedtime. 30 tablet 2   cetirizine (ZYRTEC) 10 MG tablet TAKE 1 TABLET BY MOUTH EVERY DAY 90 tablet 1   Cholecalciferol (VITAMIN D3 MAXIMUM STRENGTH) 125 MCG (5000 UT) capsule Take 5,000 Units by mouth daily.     cinacalcet (SENSIPAR) 30 MG tablet Take 30 mg by mouth daily.     cloNIDine (CATAPRES) 0.1 MG tablet Take 0.1 mg by mouth 3 (three) times daily.     esomeprazole (NEXIUM) 40 MG capsule TAKE 1 CAPSULE BY MOUTH TWICE DAILY 180 capsule 2   fenofibrate (TRICOR) 145 MG tablet TAKE 1 TABLET(145 MG) BY MOUTH DAILY 90 tablet 3   folic acid (FOLVITE) 1 MG tablet Take 1 tablet (1 mg total) by mouth daily. (Patient not taking: Reported on 10/03/2022) 30 tablet 2   furosemide (LASIX) 40 MG tablet TAKE 1 TABLET BY MOUTH EVERY DAY 90 tablet 3   gabapentin (NEURONTIN) 100 MG capsule TAKE 2 CAPSULES(200 MG) BY MOUTH AT BEDTIME 180 capsule 3   labetalol (NORMODYNE) 300 MG tablet TAKE 1.5 TABLETS BY MOUTH TWICE DAILY 270 tablet 3   levothyroxine (SYNTHROID) 88 MCG tablet Take 88 mcg by mouth daily.     lisinopril (ZESTRIL) 40 MG tablet TAKE 1 TABLET BY MOUTH EVERY DAY 90 tablet 1   lurasidone (LATUDA) 40 MG TABS tablet Take 1 tablet (40 mg total) by mouth daily with breakfast. 30 tablet 2   oxyCODONE (OXY IR/ROXICODONE) 5 MG immediate release tablet 1 tablet 30 minutes before the MRI.  May repeat once if needed. 10 tablet 0   Polyethylene Glycol 3350 (MIRALAX PO) Take 1 Capful by mouth daily as  needed (Constipation).     Potassium Chloride ER 20 MEQ TBCR TAKE 1 TABLET BY MOUTH EVERY DAY 90 tablet 2   prochlorperazine (COMPAZINE) 10 MG tablet Take 1 tablet (10 mg total) by mouth every 6 (six) hours as needed. (Patient not taking: Reported on 10/03/2022) 30 tablet 2   sucralfate (CARAFATE) 1 g tablet Take 1 tablet (1 g total) by mouth 3 (three) times daily before meals. (Patient not taking: Reported on 10/04/2022) 90 tablet 3   TART CHERRY PO Take 1 tablet by mouth daily.     No current facility-administered medications for this visit.    SURGICAL HISTORY:  Past Surgical History:  Procedure Laterality Date   BIOPSY  09/27/2022   Procedure: BIOPSY;  Surgeon: Meridee Score Netty Starring., MD;  Location: [facility] ENDOSCOPY;  Service: Gastroenterology;;   BREAST BIOPSY  2015   CESAREAN SECTION  2004   CHOLECYSTECTOMY     COLONOSCOPY WITH PROPOFOL N/A 09/27/2022   Procedure: COLONOSCOPY WITH PROPOFOL;  Surgeon: Meridee Score Netty Starring., MD;  [facility];  Service: Gastroenterology;  Laterality: N/A;   ESOPHAGOGASTRODUODENOSCOPY (EGD) WITH PROPOFOL N/A 09/27/2022   Procedure: ESOPHAGOGASTRODUODENOSCOPY (EGD) WITH PROPOFOL;  Surgeon: Meridee Score Netty Starring., MD;  Location: [facility] ENDOSCOPY;  Service: Gastroenterology;  Laterality: N/A;   INNER EAR SURGERY     ear and sinus surgery   LAPAROSCOPIC REPAIR AND REMOVAL OF GASTRIC BAND     MALONEY DILATION  09/27/2022   Procedure: MALONEY DILATION;  Surgeon: Lemar Lofty., MD;  Location[facility] ENDOSCOPY;  Service: Gastroenterology;;   OOPHORECTOMY Left    POLYPECTOMY  09/27/2022   Procedure: POLYPECTOMY;  Surgeon: Lemar Lofty., MD;  Location: [facility] ENDOSCOPY;  Service: Gastroenterology;;   TONSILLECTOMY AND ADENOIDECTOMY      REVIEW OF SYSTEMS:   Constitutional: Positive for appetite change and weight loss.  Negative for chills, fatigue and fever  HENT: Negative for mouth sores, nosebleeds, sore throat and trouble swallowing.   Eyes:  Negative for eye problems and icterus.  Respiratory: Negative for cough, hemoptysis, shortness of breath and wheezing.   Cardiovascular: Negative for chest pain and leg swelling.  Gastrointestinal: Negative for abdominal pain, nausea and vomiting.  Genitourinary: Negative for bladder incontinence, difficulty urinating, dysuria, frequency and hematuria.   Musculoskeletal: Negative for back pain, gait problem, neck pain and neck stiffness.  Skin: Positive for mild erythema on buttock. Negative for itching and rash.  Neurological: Negative for dizziness, extremity weakness, gait problem, headaches, light-headedness and seizures.  Hematological: Negative for adenopathy. Does not bruise/bleed easily.  Psychiatric/Behavioral: Negative for confusion, depression and sleep disturbance. The patient is not nervous/anxious.      PHYSICAL EXAMINATION:  Blood pressure 119/62, pulse 82, temperature 97.9 F (36.6 C), temperature source Oral, resp. rate 16, weight (!) 304 lb 11.2 oz (138.2 kg), last menstrual period 05/02/2017, SpO2 96 %.  ECOG PERFORMANCE STATUS: 1  Physical Exam  Constitutional: Oriented to person, place, and time and well-developed, well-nourished, and in no distress.  HENT:  Head: Normocephalic and atraumatic.  Mouth/Throat: Oropharynx is clear and moist. No oropharyngeal exudate.  Eyes: Conjunctivae are normal. Right eye exhibits no discharge. Left eye exhibits no discharge. No scleral icterus.  Neck: Normal range of motion. Neck supple.  Cardiovascular: Normal rate, regular rhythm, normal heart sounds and intact distal pulses.   Pulmonary/Chest: Effort normal and breath sounds normal. No respiratory distress. No wheezes. No rales.  Abdominal: Soft. Bowel sounds are normal. Exhibits no distension and no mass. There is no tenderness.  Musculoskeletal: Normal range of motion. Exhibits no edema.  Lymphadenopathy:    No cervical adenopathy.  Neurological: Alert and oriented to  person, place, and time. Exhibits normal muscle tone she was examined in the wheelchair. Skin: Skin is warm and dry. No rash noted. Not diaphoretic. No erythema. No pallor.  Psychiatric: Mood, memory and judgment normal.  Vitals reviewed.  LABORATORY DATA: Lab Results  Component Value Date   WBC 2.5 (L) 10/17/2022   HGB 12.9 10/17/2022   HCT 40.1 10/17/2022   MCV 85.0 10/17/2022   PLT 257 10/17/2022      Chemistry      Component Value Date/Time   NA  140 10/17/2022 1508   NA 145 09/08/2021 0000   K 3.6 10/17/2022 1508   CL 104 10/17/2022 1508   CO2 29 10/17/2022 1508   BUN 13 10/17/2022 1508   BUN 36 (H) 08/22/2021 1606   CREATININE 0.80 10/17/2022 1508   CREATININE 1.13 (H) 12/24/2018 1402   GLU 97 09/08/2021 0000      Component Value Date/Time   CALCIUM 9.7 10/17/2022 1508   ALKPHOS 73 10/17/2022 1508   AST 31 10/17/2022 1508   ALT 33 10/17/2022 1508   BILITOT 0.5 10/17/2022 1508       RADIOGRAPHIC STUDIES:  MR Brain W Wo Contrast  Result Date: 10/10/2022 CLINICAL DATA:  Lung cancer screening. Metastatic disease evaluation. EXAM: MRI HEAD WITHOUT AND WITH CONTRAST TECHNIQUE: Multiplanar, multiecho pulse sequences of the brain and surrounding structures were obtained without and with intravenous contrast. CONTRAST:  10mL GADAVIST GADOBUTROL 1 MMOL/ML IV SOLN COMPARISON:  None Available. FINDINGS: Brain: The brain has a normal appearance without evidence of malformation, atrophy, old or acute small or large vessel infarction, mass lesion, hemorrhage, hydrocephalus or extra-axial collection. After contrast administration, no abnormal enhancement occurs. Vascular: Major vessels at the base of the brain show flow. Venous sinuses appear patent. Skull and upper cervical spine: No definite osseous metastatic lesion is seen. There is an indeterminate focus of low T1 signal within the marrow of the lower clivus. I cannot rule out the possibility of an isolated bone metastasis but  this would be relatively unlikely unless the patient has advanced stage disease. Sinuses/Orbits: Paranasal sinuses are clear. Orbits appear normal. Question nasal polyps in the posterior nasal passages, nonobstructive. Other: None significant. IMPRESSION: 1. Normal appearance of the brain itself. No evidence of metastatic disease. 2. No definite osseous metastatic disease. Small focus of low T1 signal within the marrow of the lower clivus. I cannot rule out the possibility of an isolated bone metastasis but this would be relatively unlikely unless the patient has advanced stage disease. Electronically Signed   By: [name] Fusi M.D.   On: 10/10/2022 08:22   US BIOPSY (LIVER)  Result Date: 09/20/2022 INDICATION: No known primary, now with multiple hypermetabolic liver lesions worrisome for metastatic disease. Please perform ultrasound-guided biopsy for tissue diagnostic purposes. EXAM: ULTRASOUND GUIDED LIVER LESION BIOPSY COMPARISON:  PET-CT-08/21/2022 MEDICATIONS: None ANESTHESIA/SEDATION: Moderate (conscious) sedation was employed during this procedure as administered by the Interventional Radiology RN. A total of Versed 1.5 mg and Fentanyl 100 mcg was administered intravenously. Moderate Sedation Time: 12 minutes. The patient's level of consciousness and vital signs were monitored continuously by radiology nursing throughout the procedure under my direct supervision. COMPLICATIONS: None immediate. PROCEDURE: Informed written consent was obtained from the patient after a discussion of the risks, benefits and alternatives to treatment. The patient understands and consents the procedure. A timeout was performed prior to the initiation of the procedure. Ultrasound scanning was performed of the right upper abdominal quadrant demonstrates multiple hypoechoic lesions and masses scattered throughout both the right and left lobes of the liver. A dominant at least 4.6 x 3.2 cm hypoechoic mass within the lateral segment  of the left lobe of the liver was targeted for biopsy given location and sonographic window (image 15). The procedure was planned. The midline of the abdomen was prepped and draped in the usual sterile fashion. The overlying soft tissues were anesthetized with 1% lidocaine with epinephrine. A 17 gauge, 6.8 cm co-axial needle was advanced into a peripheral aspect of the lesion. This was  followed by 5 core biopsies with an 18 gauge core device under direct ultrasound guidance. The coaxial needle tract was embolized with a small amount of Gel-Foam slurry and superficial hemostasis was obtained with manual compression. Post procedural scanning was negative for definitive area of hemorrhage or additional complication. A dressing was placed. The patient tolerated the procedure well without immediate post procedural complication. IMPRESSION: Technically successful ultrasound guided core needle biopsy of indeterminate hypoechoic mass within the lateral segment of the left lobe of the liver. Electronically Signed   By: Simonne Come M.D.   On: 09/20/2022 16:08     ASSESSMENT/PLAN:  This is a very pleasant 57 year old African-American female referred to clinic for what is felt to be stage IV non-small cell lung cancer, adenocarcinoma (T2a, N3, M1c).  She presented with bilateral lung nodules, thoracic adenopathy and bilobar liver lesions.  She was diagnosed in May 2024.  Her PD-L1 expression 0%.  She does not have any actionable mutations by Guardant360.  Her foundation 1 molecular testing is pending at this time.   Therefore, the patient is going to undergo palliative systemic chemotherapy with carboplatin for an AUC 5 and Alimta 500 mg/m and Keytruda 200 mg IV every 3 weeks. She is status post 1 cycle of treatment.   Labs were reviewed. Her ANC is 1.4. We discussed neutropenic precautions and the importance of calling us should she ever develop signs of infection. We discussed pandemic precautions, masking, social  distancing, washing fruits/veggies, and avoiding raw seafood/meat. We will monitor this on a weekly basis. Should her ANC be <0.6, we would consider GCSF injections.   We will see her back in 2 weeks for evaluation and repeat blood work before starting cycle #2.   They had some questions regarding lung cancer. Her husband is a Programmer, multimedia. I answered their questions to their satisfaction.   We discussed nutrition and protein intake. Discussed snacking and small frequent meals.  I also discussed that I do not want her to lose more than 2 pounds per week.  I would like her to maintain her weight while undergoing chemotherapy treatment.  Also discussed hydrating well.  She had a brief episode of dizziness yesterday that lasted a few minutes.  She mention this at the end of her appointment.  She is not having any neurological symptoms.  Her symptoms have resolved at this time.  She is unsure if she was hydrating well or eating enough yesterday.  Should she have recurrent episodes of dizziness, advised to check her blood pressure and blood sugar first.  I also asked her to check if this is correlated with not eating for long durations.  Regarding the sore on her buttock, the patient has had this in the past which typically resolves with bacitracin.  She will continue this for now but we discussed symptoms that would warrant reevaluation such as increased erythema, drainage, pain, warmth, vesicle formation, or skin breakdown.  Discussed should she have concerns, that she is free to follow-up with her PCP for any noncancer related concerns.  The patient has a intermittent dry cough, nothing regular or sustained.  She does not have any signs or symptoms of infection or pneumonia at this time.  Her cough is dry when it occurs.  She has not needed to take any cough medication.  We discussed should she ever develop any new or worsening cough such as phlegm production, fevers, shortness of breath, sore throat,  malaise, etc. that we would recommend prompt evaluation  while she is undergoing chemotherapy.  The patient was advised to call immediately if she has any concerning symptoms in the interval. The patient voices understanding of current disease status and treatment options and is in agreement with the current care plan. All questions were answered. The patient knows to call the clinic with any problems, questions or concerns. We can certainly see the patient much sooner if necessary         No orders of the defined types were placed in this encounter.   The total time spent in the appointment was 20-29 minutes  Shyheim Tanney L Alif Petrak, PA-C 10/17/22

## 2022-10-13 ENCOUNTER — Encounter: Payer: Self-pay | Admitting: Internal Medicine

## 2022-10-13 NOTE — Progress Notes (Signed)
Patient called after receiving my card per my request.  Introduced myself as Dance movement psychotherapist and to offer available resources. Discussed one-time $1000 Marketing executive to assist with personal expenses while going through treatment. Advised what is needed to apply. Patient will provide via email and complete grant paperwork on 6/18 at next visit at check-in. She was advised to contact me at earliest convenience to discuss grant expenses in detail.  She has my card to do so and for any additional financial questions or concerns.

## 2022-10-14 ENCOUNTER — Other Ambulatory Visit (INDEPENDENT_AMBULATORY_CARE_PROVIDER_SITE_OTHER): Payer: Self-pay | Admitting: Family Medicine

## 2022-10-14 DIAGNOSIS — R7303 Prediabetes: Secondary | ICD-10-CM

## 2022-10-16 ENCOUNTER — Other Ambulatory Visit: Payer: Self-pay | Admitting: *Deleted

## 2022-10-16 DIAGNOSIS — C3492 Malignant neoplasm of unspecified part of left bronchus or lung: Secondary | ICD-10-CM

## 2022-10-17 ENCOUNTER — Inpatient Hospital Stay: Payer: BC Managed Care – PPO

## 2022-10-17 ENCOUNTER — Inpatient Hospital Stay (HOSPITAL_BASED_OUTPATIENT_CLINIC_OR_DEPARTMENT_OTHER): Payer: BC Managed Care – PPO | Admitting: Physician Assistant

## 2022-10-17 ENCOUNTER — Other Ambulatory Visit: Payer: Self-pay

## 2022-10-17 VITALS — BP 119/62 | HR 82 | Temp 97.9°F | Resp 16 | Wt 304.7 lb

## 2022-10-17 DIAGNOSIS — Z5112 Encounter for antineoplastic immunotherapy: Secondary | ICD-10-CM | POA: Diagnosis not present

## 2022-10-17 DIAGNOSIS — C3492 Malignant neoplasm of unspecified part of left bronchus or lung: Secondary | ICD-10-CM

## 2022-10-17 LAB — CMP (CANCER CENTER ONLY)
ALT: 33 U/L (ref 0–44)
AST: 31 U/L (ref 15–41)
Albumin: 3.6 g/dL (ref 3.5–5.0)
Alkaline Phosphatase: 73 U/L (ref 38–126)
Anion gap: 7 (ref 5–15)
BUN: 13 mg/dL (ref 6–20)
CO2: 29 mmol/L (ref 22–32)
Calcium: 9.7 mg/dL (ref 8.9–10.3)
Chloride: 104 mmol/L (ref 98–111)
Creatinine: 0.8 mg/dL (ref 0.44–1.00)
GFR, Estimated: >60 mL/min (ref >60)
Glucose, Bld: 191 mg/dL — ABNORMAL HIGH (ref 70–99)
Potassium: 3.6 mmol/L (ref 3.5–5.1)
Sodium: 140 mmol/L (ref 135–145)
Total Bilirubin: 0.5 mg/dL (ref 0.3–1.2)
Total Protein: 6.3 g/dL — ABNORMAL LOW (ref 6.5–8.1)

## 2022-10-17 LAB — CBC WITH DIFFERENTIAL (CANCER CENTER ONLY)
Abs Immature Granulocytes: 0 10*3/uL (ref 0.00–0.07)
Basophils Absolute: 0 10*3/uL (ref 0.0–0.1)
Basophils Relative: 1 %
Eosinophils Absolute: 0.1 10*3/uL (ref 0.0–0.5)
Eosinophils Relative: 3 %
HCT: 40.1 % (ref 36.0–46.0)
Hemoglobin: 12.9 g/dL (ref 12.0–15.0)
Immature Granulocytes: 0 %
Lymphocytes Relative: 38 %
Lymphs Abs: 1 10*3/uL (ref 0.7–4.0)
MCH: 27.3 pg (ref 26.0–34.0)
MCHC: 32.2 g/dL (ref 30.0–36.0)
MCV: 85 fL (ref 80.0–100.0)
Monocytes Absolute: 0.1 10*3/uL (ref 0.1–1.0)
Monocytes Relative: 3 %
Neutro Abs: 1.4 10*3/uL — ABNORMAL LOW (ref 1.7–7.7)
Neutrophils Relative %: 55 %
Platelet Count: 257 10*3/uL (ref 150–400)
RBC: 4.72 MIL/uL (ref 3.87–5.11)
RDW: 13.8 % (ref 11.5–15.5)
WBC Count: 2.5 10*3/uL — ABNORMAL LOW (ref 4.0–10.5)
nRBC: 0 % (ref 0.0–0.2)

## 2022-10-18 ENCOUNTER — Encounter: Payer: Self-pay | Admitting: Nurse Practitioner

## 2022-10-18 ENCOUNTER — Ambulatory Visit (INDEPENDENT_AMBULATORY_CARE_PROVIDER_SITE_OTHER): Payer: BC Managed Care – PPO | Admitting: Nurse Practitioner

## 2022-10-18 VITALS — BP 116/72 | HR 74 | Temp 97.2°F | Ht 62.0 in | Wt 304.6 lb

## 2022-10-18 DIAGNOSIS — E213 Hyperparathyroidism, unspecified: Secondary | ICD-10-CM

## 2022-10-18 DIAGNOSIS — Z7984 Long term (current) use of oral hypoglycemic drugs: Secondary | ICD-10-CM | POA: Diagnosis not present

## 2022-10-18 DIAGNOSIS — E1169 Type 2 diabetes mellitus with other specified complication: Secondary | ICD-10-CM | POA: Diagnosis not present

## 2022-10-18 DIAGNOSIS — E785 Hyperlipidemia, unspecified: Secondary | ICD-10-CM | POA: Diagnosis not present

## 2022-10-18 LAB — LIPID PANEL
Cholesterol: 160 mg/dL (ref 0–200)
HDL: 47.1 mg/dL (ref >39.00)
LDL Cholesterol: 86 mg/dL (ref 0–99)
NonHDL: 112.72
Total CHOL/HDL Ratio: 3
Triglycerides: 132 mg/dL (ref 0.0–149.0)
VLDL: 26.4 mg/dL (ref 0.0–40.0)

## 2022-10-18 LAB — MICROALBUMIN / CREATININE URINE RATIO
Creatinine,U: 34.3 mg/dL
Microalb Creat Ratio: 2 mg/g (ref 0.0–30.0)
Microalb, Ur: <0.7 mg/dL (ref 0.0–1.9)

## 2022-10-18 LAB — HEMOGLOBIN A1C: Hgb A1c MFr Bld: 6.3 % (ref 4.6–6.5)

## 2022-10-18 LAB — EXTRA LAV TOP TUBE

## 2022-10-18 NOTE — Assessment & Plan Note (Addendum)
Repeat hgbA1c today Metformin on hold with new diagnosis of metastatic cancer to liver Normal urine microalbumin Eye exam report requested

## 2022-10-18 NOTE — Patient Instructions (Signed)
Go to lab Maintain current med doses 

## 2022-10-18 NOTE — Assessment & Plan Note (Signed)
Current use of fenofibrate and low dose atorvastatin Unable to tolerate crestor Repeat lipid panel

## 2022-10-18 NOTE — Assessment & Plan Note (Signed)
Followed by endocrinology-Dr. Doneta Public Repeat PTH and Ca

## 2022-10-18 NOTE — Progress Notes (Signed)
Established Patient Visit  Patient: Madison Palmer   DOB: February 15, 1966   57 y.o. Female  MRN: [id number] Visit Date: 10/18/2022  Subjective:    Chief Complaint  Patient presents with   Follow-up    No concerns  Started chemo therapy 10/10/2022   Accompanied by husband-carlton today.  HPI DM (diabetes mellitus) (HCC) Repeat hgbA1c today Metformin on hold with new diagnosis of metastatic cancer to liver Normal urine microalbumin Eye exam report requested  Hyperlipidemia associated with type 2 diabetes mellitus (HCC) Current use of fenofibrate and low dose atorvastatin Unable to tolerate crestor Repeat lipid panel  Hyperparathyroidism (HCC) Followed by endocrinology-Dr. Doneta Public Repeat PTH and Ca  With recent metastatic cancer to liver, she reports stable mood at this time. She has the support from her husband, young adult son and friends. Her Husband lives in [place], but plans to stay involved. She lives with her young adult son. They plans to set up grocery deliver service and/or meal prep service. She states her friend will help while husband returns to [place]. 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 She has maintained appointment with with her life coach/psychologist.  6lbs weight loss noted in last 2weeks. She reports daily. No nausea, no ABDOMEN pain, no change in BM or urinaltion. Encouraged to have a high protein snack or protein shake or ensure as meal replacement. Wt Readings from Last 3 Encounters:  10/18/22 (!) 304 lb 9.6 oz (138.2 kg)  10/17/22 (!) 304 lb 11.2 oz (138.2 kg)  10/10/22 (!) 308 lb 8 oz (139.9 kg)   Reviewed medical, surgical, and social history today  Medications: Outpatient Medications Prior to Visit  Medication Sig   acetaminophen (TYLENOL) 500 MG tablet Take 1,000 mg by mouth 3 (three) times daily as needed for moderate pain or headache.   amLODipine  (NORVASC) 10 MG tablet TAKE 1 TABLET BY MOUTH EVERY DAY   atorvastatin (LIPITOR) 20 MG tablet TAKE 1 TABLET(20 MG) BY MOUTH 3 TIMES A WEEK   benztropine (COGENTIN) 0.5 MG tablet Take 1 tablet (0.5 mg total) by mouth at bedtime.   cetirizine (ZYRTEC) 10 MG tablet TAKE 1 TABLET BY MOUTH EVERY DAY   Cholecalciferol (VITAMIN D3 MAXIMUM STRENGTH) 125 MCG (5000 UT) capsule Take 5,000 Units by mouth daily.   cinacalcet (SENSIPAR) 30 MG tablet Take 30 mg by mouth daily.   cloNIDine (CATAPRES) 0.1 MG tablet Take 0.1 mg by mouth 3 (three) times daily.   esomeprazole (NEXIUM) 40 MG capsule TAKE 1 CAPSULE BY MOUTH TWICE DAILY   fenofibrate (TRICOR) 145 MG tablet TAKE 1 TABLET(145 MG) BY MOUTH DAILY   folic acid (FOLVITE) 1 MG tablet Take 1 tablet (1 mg total) by mouth daily.   furosemide (LASIX) 40 MG tablet TAKE 1 TABLET BY MOUTH EVERY DAY   gabapentin (NEURONTIN) 100 MG capsule TAKE 2 CAPSULES(200 MG) BY MOUTH AT BEDTIME   labetalol (NORMODYNE) 300 MG tablet TAKE 1.5 TABLETS BY MOUTH TWICE DAILY   levothyroxine (SYNTHROID) 88 MCG tablet Take 88 mcg by mouth daily.   lisinopril (ZESTRIL) 40 MG tablet TAKE 1 TABLET BY MOUTH EVERY DAY   lurasidone (LATUDA) 40 MG TABS tablet Take 1 tablet (40 mg total) by mouth daily with breakfast.   oxyCODONE (OXY IR/ROXICODONE) 5 MG immediate release tablet 1 tablet 30 minutes before the MRI.  May repeat  once if needed.   Polyethylene Glycol 3350 (MIRALAX PO) Take 1 Capful by mouth daily as needed (Constipation).   Potassium Chloride ER 20 MEQ TBCR TAKE 1 TABLET BY MOUTH EVERY DAY   prochlorperazine (COMPAZINE) 10 MG tablet Take 1 tablet (10 mg total) by mouth every 6 (six) hours as needed.   sucralfate (CARAFATE) 1 g tablet Take 1 tablet (1 g total) by mouth 3 (three) times daily before meals.   TART CHERRY PO Take 1 tablet by mouth daily.   No facility-administered medications prior to visit.   Reviewed past medical and social history.   ROS per HPI above       Objective:  BP 116/72 (BP Location: Left Arm, Patient Position: Sitting, Cuff Size: Large)   Pulse 74   Temp (!) 97.2 F (36.2 C) (Temporal)   Ht 5\' 2"  (1.575 m)   Wt (!) 304 lb 9.6 oz (138.2 kg)   LMP 05/02/2017   SpO2 96%   BMI 55.71 kg/m      Physical Exam Cardiovascular:     Rate and Rhythm: Normal rate and regular rhythm.     Pulses: Normal pulses.     Heart sounds: Normal heart sounds.  Pulmonary:     Effort: Pulmonary effort is normal.     Breath sounds: Normal breath sounds.  Musculoskeletal:     Right lower leg: No edema.     Left lower leg: No edema.  Neurological:     Mental Status: She is alert and oriented to person, place, and time.  Psychiatric:        Mood and Affect: Mood normal.        Behavior: Behavior normal.        Thought Content: Thought content normal.     No results found for any visits on 10/18/22.    Assessment & Plan:    Problem List Items Addressed This Visit       Endocrine   DM (diabetes mellitus) (HCC) - Primary    Repeat hgbA1c today Metformin on hold with new diagnosis of metastatic cancer to liver Normal urine microalbumin Eye exam report requested      Relevant Orders   Hemoglobin A1c   Microalbumin / creatinine urine ratio   Hyperlipidemia associated with type 2 diabetes mellitus (HCC)    Current use of fenofibrate and low dose atorvastatin Unable to tolerate crestor Repeat lipid panel      Relevant Orders   Lipid panel   Hyperparathyroidism (HCC)    Followed by endocrinology-Dr. Doneta Public Repeat PTH and Ca      Relevant Orders   PTH, intact (no Ca)   Return in about 3 months (around 01/18/2023) for HTN, DM, hyperlipidemia (fasting).     Alysia Penna, NP

## 2022-10-19 LAB — T4: T4, Total: 10.9 ug/dL (ref 4.5–12.0)

## 2022-10-19 LAB — TSH: TSH: 1.85 u[IU]/mL (ref 0.350–4.500)

## 2022-10-19 LAB — PARATHYROID HORMONE, INTACT (NO CA): PTH: 87 pg/mL — ABNORMAL HIGH (ref 16–77)

## 2022-10-20 NOTE — Progress Notes (Signed)
NN reached out to pt to assess navigation needs and overall well-being. Pt states she is doing well, still has a good appetite and sometimes gets fatigued, but rests when she needs to. No barriers at this time. NN encourages pt to call anytime with any concerns or questions.

## 2022-10-23 ENCOUNTER — Other Ambulatory Visit: Payer: Self-pay | Admitting: *Deleted

## 2022-10-23 DIAGNOSIS — C3492 Malignant neoplasm of unspecified part of left bronchus or lung: Secondary | ICD-10-CM

## 2022-10-24 ENCOUNTER — Other Ambulatory Visit: Payer: Self-pay

## 2022-10-24 ENCOUNTER — Inpatient Hospital Stay: Payer: BC Managed Care – PPO

## 2022-10-24 DIAGNOSIS — C3492 Malignant neoplasm of unspecified part of left bronchus or lung: Secondary | ICD-10-CM

## 2022-10-24 DIAGNOSIS — Z5112 Encounter for antineoplastic immunotherapy: Secondary | ICD-10-CM | POA: Diagnosis not present

## 2022-10-24 LAB — CMP (CANCER CENTER ONLY)
ALT: 47 U/L — ABNORMAL HIGH (ref 0–44)
AST: 29 U/L (ref 15–41)
Albumin: 3.6 g/dL (ref 3.5–5.0)
Alkaline Phosphatase: 79 U/L (ref 38–126)
Anion gap: 8 (ref 5–15)
BUN: 19 mg/dL (ref 6–20)
CO2: 27 mmol/L (ref 22–32)
Calcium: 9.7 mg/dL (ref 8.9–10.3)
Chloride: 106 mmol/L (ref 98–111)
Creatinine: 0.92 mg/dL (ref 0.44–1.00)
GFR, Estimated: >60 mL/min (ref >60)
Glucose, Bld: 139 mg/dL — ABNORMAL HIGH (ref 70–99)
Potassium: 3.8 mmol/L (ref 3.5–5.1)
Sodium: 141 mmol/L (ref 135–145)
Total Bilirubin: 0.4 mg/dL (ref 0.3–1.2)
Total Protein: 6.8 g/dL (ref 6.5–8.1)

## 2022-10-24 LAB — CBC WITH DIFFERENTIAL (CANCER CENTER ONLY)
Abs Immature Granulocytes: 0 10*3/uL (ref 0.00–0.07)
Basophils Absolute: 0 10*3/uL (ref 0.0–0.1)
Basophils Relative: 0 %
Eosinophils Absolute: 0.1 10*3/uL (ref 0.0–0.5)
Eosinophils Relative: 2 %
HCT: 39.2 % (ref 36.0–46.0)
Hemoglobin: 12.7 g/dL (ref 12.0–15.0)
Immature Granulocytes: 0 %
Lymphocytes Relative: 41 %
Lymphs Abs: 1.1 10*3/uL (ref 0.7–4.0)
MCH: 27.7 pg (ref 26.0–34.0)
MCHC: 32.4 g/dL (ref 30.0–36.0)
MCV: 85.4 fL (ref 80.0–100.0)
Monocytes Absolute: 0.3 10*3/uL (ref 0.1–1.0)
Monocytes Relative: 10 %
Neutro Abs: 1.2 10*3/uL — ABNORMAL LOW (ref 1.7–7.7)
Neutrophils Relative %: 47 %
Platelet Count: 202 10*3/uL (ref 150–400)
RBC: 4.59 MIL/uL (ref 3.87–5.11)
RDW: 14.6 % (ref 11.5–15.5)
WBC Count: 2.6 10*3/uL — ABNORMAL LOW (ref 4.0–10.5)
nRBC: 0 % (ref 0.0–0.2)

## 2022-10-25 ENCOUNTER — Encounter: Payer: Self-pay | Admitting: Internal Medicine

## 2022-10-25 LAB — TSH: TSH: 1.783 u[IU]/mL (ref 0.350–4.500)

## 2022-10-25 NOTE — Progress Notes (Signed)
Informed by registration patient did not bring her documentation for [employer] grant on 6/25. Pt's next visit is 10/31/22 and she may bring on that day to apply.  She has my card for any additional financial questions or concerns.

## 2022-10-26 LAB — T4: T4, Total: 10.3 ug/dL (ref 4.5–12.0)

## 2022-10-30 MED FILL — Dexamethasone Sodium Phosphate Inj 100 MG/10ML: INTRAMUSCULAR | Qty: 1 | Status: AC

## 2022-10-30 MED FILL — Fosaprepitant Dimeglumine For IV Infusion 150 MG (Base Eq): INTRAVENOUS | Qty: 5 | Status: AC

## 2022-10-31 ENCOUNTER — Encounter: Payer: Self-pay | Admitting: Internal Medicine

## 2022-10-31 ENCOUNTER — Inpatient Hospital Stay (HOSPITAL_BASED_OUTPATIENT_CLINIC_OR_DEPARTMENT_OTHER): Payer: BC Managed Care – PPO | Admitting: Internal Medicine

## 2022-10-31 ENCOUNTER — Inpatient Hospital Stay: Payer: BC Managed Care – PPO

## 2022-10-31 ENCOUNTER — Inpatient Hospital Stay: Payer: BC Managed Care – PPO | Attending: Physician Assistant

## 2022-10-31 ENCOUNTER — Other Ambulatory Visit: Payer: Self-pay

## 2022-10-31 VITALS — BP 135/66 | HR 68 | Resp 18

## 2022-10-31 DIAGNOSIS — R0602 Shortness of breath: Secondary | ICD-10-CM | POA: Diagnosis not present

## 2022-10-31 DIAGNOSIS — R634 Abnormal weight loss: Secondary | ICD-10-CM | POA: Diagnosis not present

## 2022-10-31 DIAGNOSIS — C3490 Malignant neoplasm of unspecified part of unspecified bronchus or lung: Secondary | ICD-10-CM | POA: Insufficient documentation

## 2022-10-31 DIAGNOSIS — E538 Deficiency of other specified B group vitamins: Secondary | ICD-10-CM | POA: Insufficient documentation

## 2022-10-31 DIAGNOSIS — Z5112 Encounter for antineoplastic immunotherapy: Secondary | ICD-10-CM | POA: Insufficient documentation

## 2022-10-31 DIAGNOSIS — F319 Bipolar disorder, unspecified: Secondary | ICD-10-CM | POA: Diagnosis not present

## 2022-10-31 DIAGNOSIS — Z79899 Other long term (current) drug therapy: Secondary | ICD-10-CM | POA: Diagnosis not present

## 2022-10-31 DIAGNOSIS — C3492 Malignant neoplasm of unspecified part of left bronchus or lung: Secondary | ICD-10-CM

## 2022-10-31 DIAGNOSIS — R5383 Other fatigue: Secondary | ICD-10-CM | POA: Insufficient documentation

## 2022-10-31 DIAGNOSIS — Z79620 Long term (current) use of immunosuppressive biologic: Secondary | ICD-10-CM | POA: Diagnosis not present

## 2022-10-31 DIAGNOSIS — Z5111 Encounter for antineoplastic chemotherapy: Secondary | ICD-10-CM | POA: Insufficient documentation

## 2022-10-31 DIAGNOSIS — C787 Secondary malignant neoplasm of liver and intrahepatic bile duct: Secondary | ICD-10-CM | POA: Insufficient documentation

## 2022-10-31 LAB — CBC WITH DIFFERENTIAL (CANCER CENTER ONLY)
Abs Immature Granulocytes: 0.04 10*3/uL (ref 0.00–0.07)
Basophils Absolute: 0 10*3/uL (ref 0.0–0.1)
Basophils Relative: 1 %
Eosinophils Absolute: 0.1 10*3/uL (ref 0.0–0.5)
Eosinophils Relative: 2 %
HCT: 38 % (ref 36.0–46.0)
Hemoglobin: 12.3 g/dL (ref 12.0–15.0)
Immature Granulocytes: 1 %
Lymphocytes Relative: 35 %
Lymphs Abs: 1.5 10*3/uL (ref 0.7–4.0)
MCH: 28 pg (ref 26.0–34.0)
MCHC: 32.4 g/dL (ref 30.0–36.0)
MCV: 86.6 fL (ref 80.0–100.0)
Monocytes Absolute: 0.4 10*3/uL (ref 0.1–1.0)
Monocytes Relative: 11 %
Neutro Abs: 2.1 10*3/uL (ref 1.7–7.7)
Neutrophils Relative %: 50 %
Platelet Count: 337 10*3/uL (ref 150–400)
RBC: 4.39 MIL/uL (ref 3.87–5.11)
RDW: 15.4 % (ref 11.5–15.5)
WBC Count: 4.2 10*3/uL (ref 4.0–10.5)
nRBC: 0 % (ref 0.0–0.2)

## 2022-10-31 LAB — CMP (CANCER CENTER ONLY)
ALT: 32 U/L (ref 0–44)
AST: 22 U/L (ref 15–41)
Albumin: 3.7 g/dL (ref 3.5–5.0)
Alkaline Phosphatase: 69 U/L (ref 38–126)
Anion gap: 9 (ref 5–15)
BUN: 12 mg/dL (ref 6–20)
CO2: 28 mmol/L (ref 22–32)
Calcium: 9.7 mg/dL (ref 8.9–10.3)
Chloride: 105 mmol/L (ref 98–111)
Creatinine: 0.81 mg/dL (ref 0.44–1.00)
GFR, Estimated: >60 mL/min (ref >60)
Glucose, Bld: 115 mg/dL — ABNORMAL HIGH (ref 70–99)
Potassium: 3.8 mmol/L (ref 3.5–5.1)
Sodium: 142 mmol/L (ref 135–145)
Total Bilirubin: 0.5 mg/dL (ref 0.3–1.2)
Total Protein: 6.3 g/dL — ABNORMAL LOW (ref 6.5–8.1)

## 2022-10-31 MED ORDER — SODIUM CHLORIDE 0.9 % IV SOLN
10.0000 mg | Freq: Once | INTRAVENOUS | Status: AC
  Administered 2022-10-31: 10 mg via INTRAVENOUS
  Filled 2022-10-31: qty 10

## 2022-10-31 MED ORDER — SODIUM CHLORIDE 0.9 % IV SOLN
200.0000 mg | Freq: Once | INTRAVENOUS | Status: AC
  Administered 2022-10-31: 200 mg via INTRAVENOUS
  Filled 2022-10-31: qty 200

## 2022-10-31 MED ORDER — PALONOSETRON HCL INJECTION 0.25 MG/5ML
0.2500 mg | Freq: Once | INTRAVENOUS | Status: AC
  Administered 2022-10-31: 0.25 mg via INTRAVENOUS
  Filled 2022-10-31: qty 5

## 2022-10-31 MED ORDER — SODIUM CHLORIDE 0.9 % IV SOLN
750.0000 mg | Freq: Once | INTRAVENOUS | Status: AC
  Administered 2022-10-31: 750 mg via INTRAVENOUS
  Filled 2022-10-31: qty 75

## 2022-10-31 MED ORDER — SODIUM CHLORIDE 0.9 % IV SOLN: Freq: Once | INTRAVENOUS | Status: AC

## 2022-10-31 MED ORDER — SODIUM CHLORIDE 0.9 % IV SOLN
150.0000 mg | Freq: Once | INTRAVENOUS | Status: AC
  Administered 2022-10-31: 150 mg via INTRAVENOUS
  Filled 2022-10-31: qty 150

## 2022-10-31 MED ORDER — SODIUM CHLORIDE 0.9 % IV SOLN
500.0000 mg/m2 | Freq: Once | INTRAVENOUS | Status: AC
  Administered 2022-10-31: 1200 mg via INTRAVENOUS
  Filled 2022-10-31: qty 40

## 2022-10-31 NOTE — Progress Notes (Signed)
Patient provided documents for [employer].  Patient approved for one-time $1000 [employer] grant to assist with personal expenses while going through treatment. She has a copy of the approval letter and expense sheet in green folder. She received a gas card today from her grant. She was advised to call met at earliest convenience to discuss grant expenses in detail.  She has my card to do so and for any additional financial questions or concerns.

## 2022-10-31 NOTE — Patient Instructions (Addendum)
[facility]  Discharge Instructions: Thank you for choosing [facility] to provide your oncology and hematology care.   If you have a lab appointment with the Cancer Center, please go directly to the Cancer Center and check in at the registration area.   Wear comfortable clothing and clothing appropriate for easy access to any Portacath or PICC line.   We strive to give you quality time with your provider. You may need to reschedule your appointment if you arrive late (15 or more minutes).  Arriving late affects you and other patients whose appointments are after yours.  Also, if you miss three or more appointments without notifying the office, you may be dismissed from the clinic at the provider's discretion.      For prescription refill requests, have your pharmacy contact our office and allow 72 hours for refills to be completed.    Today you received the following chemotherapy and/or immunotherapy agents Keytruda, Alimta, Carboplatin.      To help prevent nausea and vomiting after your treatment, we encourage you to take your nausea medication as directed.  BELOW ARE SYMPTOMS THAT SHOULD BE REPORTED IMMEDIATELY: *FEVER GREATER THAN 100.4 F (38 C) OR HIGHER *CHILLS OR SWEATING *NAUSEA AND VOMITING THAT IS NOT CONTROLLED WITH YOUR NAUSEA MEDICATION *UNUSUAL SHORTNESS OF BREATH *UNUSUAL BRUISING OR BLEEDING *URINARY PROBLEMS (pain or burning when urinating, or frequent urination) *BOWEL PROBLEMS (unusual diarrhea, constipation, pain near the anus) TENDERNESS IN MOUTH AND THROAT WITH OR WITHOUT PRESENCE OF ULCERS (sore throat, sores in mouth, or a toothache) UNUSUAL RASH, SWELLING OR PAIN  UNUSUAL VAGINAL DISCHARGE OR ITCHING   Items with * indicate a potential emergency and should be followed up as soon as possible or go to the Emergency Department if any problems should occur.  Please show the CHEMOTHERAPY ALERT CARD or IMMUNOTHERAPY  ALERT CARD at check-in to the Emergency Department and triage nurse.  Should you have questions after your visit or need to cancel or reschedule your appointment, please contact [facility]  Dept: [phone]  and follow the prompts.  Office hours are 8:00 a.m. to 4:30 p.m. Monday - Friday. Please note that voicemails left after 4:00 p.m. may not be returned until the following business day.  We are closed weekends and major holidays. You have access to a nurse at all times for urgent questions. Please call the main number to the clinic Dept: [phone] and follow the prompts.   For any non-urgent questions, you may also contact your provider using MyChart. We now offer e-Visits for anyone 18 and older to request care online for non-urgent symptoms. For details visit [email or web address].   Also download the MyChart app! Go to the app store, search "MyChart", open the app, select , and log in with your MyChart username and password.   

## 2022-10-31 NOTE — Progress Notes (Signed)
[facility] [phone]   [phone]  OFFICE PROGRESS NOTE  [name], [name], NP [address] [place] [address]   DIAGNOSIS: Stage IV non-small cell lung cancer, adenocarcinoma (T2a, N3, M1c).  She presented with bilateral lung nodules, thoracic adenopathy and bilobar liver lesions.  She was diagnosed in May 2024.     PDL1: 0   Guardant 360: Negative for any actionable mutations   Foundation One: No actionable mutations    PRIOR THERAPY: None   CURRENT THERAPY: Palliative systemic chemotherapy and immunotherapy with carboplatin for an AUC of 5, alimta 500 mg/m2, and Keytruda 200 mg IV every 3 weeks. First dose on 10/10/22. Status post 1 cycle.  INTERVAL HISTORY: Madison Palmer 57 y.o. female returns to the clinic today for follow-up visit.  The patient is feeling fine today with no concerning complaints except for mild fatigue.  She tolerated the first cycle of her systemic chemotherapy with carboplatin, Alimta and Keytruda fairly well.  She denied having any current chest pain but has shortness of breath with exertion with no cough or hemoptysis.  She has no nausea, vomiting, diarrhea or constipation.  She has no headache or visual changes.  She is here today for evaluation before starting cycle #2 of her treatment.  MEDICAL HISTORY: Past Medical History:  Diagnosis Date   Acute on chronic respiratory failure with hypoxia and hypercapnia (HCC) 09/15/2018   Anemia    Anxiety    Arrhythmia    tachycardia   Arthritis    Bipolar 1 disorder (HCC) 09/12/2018   Bipolar I disorder, current or most recent episode manic, with psychotic features (HCC)    Brief psychotic disorder (HCC) 08/22/2018   Chronic respiratory failure with hypoxia (HCC) 05/10/2018   Formatting of this note might be different from the original. Last Assessment & Plan:  Continue 1 L continuous on exertion and during sleep. On her next visit we will check OSA  on CPAP/room air to decide whether she needs to take oxygen along with her on her cruise in May   Depression    Diverticulitis    Generalized edema 03/12/2018   GERD (gastroesophageal reflux disease)    Glaucoma    Hyperlipidemia    Hyperparathyroidism (HCC)    Hypertension    Inflammatory polyps of colon (HCC)    Leg swelling 01/14/2019   LVH (left ventricular hypertrophy)    Lymphedema    Morbid (severe) obesity due to excess calories (HCC) 05/11/2017   PCOS (polycystic ovarian syndrome)    Prediabetes    Recurrent UTI    Sleep apnea    CPAP   Thyroid disease    Vitamin D deficiency     ALLERGIES:  is allergic to other, fentanyl, levofloxacin, midazolam, pollen extract, atorvastatin, hydralazine hcl, and rosuvastatin.  MEDICATIONS:  Current Outpatient Medications  Medication Sig Dispense Refill   acetaminophen (TYLENOL) 500 MG tablet Take 1,000 mg by mouth 3 (three) times daily as needed for moderate pain or headache.     amLODipine (NORVASC) 10 MG tablet TAKE 1 TABLET BY MOUTH EVERY DAY 90 tablet 3   atorvastatin (LIPITOR) 20 MG tablet TAKE 1 TABLET(20 MG) BY MOUTH 3 TIMES A WEEK 12 tablet 5   benztropine (COGENTIN) 0.5 MG tablet Take 1 tablet (0.5 mg total) by mouth at bedtime. 30 tablet 2   cetirizine (ZYRTEC) 10 MG tablet TAKE 1 TABLET BY MOUTH EVERY DAY 90 tablet 1   Cholecalciferol (VITAMIN  D3 MAXIMUM STRENGTH) 125 MCG (5000 UT) capsule Take 5,000 Units by mouth daily.     cinacalcet (SENSIPAR) 30 MG tablet Take 30 mg by mouth daily.     cloNIDine (CATAPRES) 0.1 MG tablet Take 0.1 mg by mouth 3 (three) times daily.     esomeprazole (NEXIUM) 40 MG capsule TAKE 1 CAPSULE BY MOUTH TWICE DAILY 180 capsule 2   fenofibrate (TRICOR) 145 MG tablet TAKE 1 TABLET(145 MG) BY MOUTH DAILY 90 tablet 3   folic acid (FOLVITE) 1 MG tablet Take 1 tablet (1 mg total) by mouth daily. 30 tablet 2   furosemide (LASIX) 40 MG tablet TAKE 1 TABLET BY MOUTH EVERY DAY 90 tablet 3   gabapentin  (NEURONTIN) 100 MG capsule TAKE 2 CAPSULES(200 MG) BY MOUTH AT BEDTIME 180 capsule 3   labetalol (NORMODYNE) 300 MG tablet TAKE 1.5 TABLETS BY MOUTH TWICE DAILY 270 tablet 3   levothyroxine (SYNTHROID) 88 MCG tablet Take 88 mcg by mouth daily.     lisinopril (ZESTRIL) 40 MG tablet TAKE 1 TABLET BY MOUTH EVERY DAY 90 tablet 1   lurasidone (LATUDA) 40 MG TABS tablet Take 1 tablet (40 mg total) by mouth daily with breakfast. 30 tablet 2   oxyCODONE (OXY IR/ROXICODONE) 5 MG immediate release tablet 1 tablet 30 minutes before the MRI.  May repeat once if needed. 10 tablet 0   Polyethylene Glycol 3350 (MIRALAX PO) Take 1 Capful by mouth daily as needed (Constipation).     Potassium Chloride ER 20 MEQ TBCR TAKE 1 TABLET BY MOUTH EVERY DAY 90 tablet 2   prochlorperazine (COMPAZINE) 10 MG tablet Take 1 tablet (10 mg total) by mouth every 6 (six) hours as needed. 30 tablet 2   sucralfate (CARAFATE) 1 g tablet Take 1 tablet (1 g total) by mouth 3 (three) times daily before meals. 90 tablet 3   TART CHERRY PO Take 1 tablet by mouth daily.     No current facility-administered medications for this visit.    SURGICAL HISTORY:  Past Surgical History:  Procedure Laterality Date   BIOPSY  09/27/2022   Procedure: BIOPSY;  Surgeon: Mansouraty, Netty Starring., MD;  Location: [facility] ENDOSCOPY;  Service: Gastroenterology;;   BREAST BIOPSY  2015   CESAREAN SECTION  2004   CHOLECYSTECTOMY     COLONOSCOPY WITH PROPOFOL N/A 09/27/2022   Procedure: COLONOSCOPY WITH PROPOFOL;  Surgeon: Lemar Lofty., MD;  Location: [facility] ENDOSCOPY;  Service: Gastroenterology;  Laterality: N/A;   ESOPHAGOGASTRODUODENOSCOPY (EGD) WITH PROPOFOL N/A 09/27/2022   Procedure: ESOPHAGOGASTRODUODENOSCOPY (EGD) WITH PROPOFOL;  Surgeon: Meridee Score Netty Starring., MD;  Location: [facility] ENDOSCOPY;  Service: Gastroenterology;  Laterality: N/A;   INNER EAR SURGERY     ear and sinus surgery   LAPAROSCOPIC REPAIR AND REMOVAL OF GASTRIC BAND     MALONEY  DILATION  09/27/2022   Procedure: MALONEY DILATION;  Surgeon: Lemar Lofty., MD;  Location: [facility] ENDOSCOPY;  Service: Gastroenterology;;   OOPHORECTOMY Left    POLYPECTOMY  09/27/2022   Procedure: POLYPECTOMY;  Surgeon: Lemar Lofty., MD;  Location: [facility] ENDOSCOPY;  Service: Gastroenterology;;   TONSILLECTOMY AND ADENOIDECTOMY      REVIEW OF SYSTEMS:  A comprehensive review of systems was negative except for: Constitutional: positive for fatigue Respiratory: positive for dyspnea on exertion   PHYSICAL EXAMINATION: General appearance: alert, cooperative, fatigued, and no distress Head: Normocephalic, without obvious abnormality, atraumatic Neck: no adenopathy, no JVD, supple, symmetrical, trachea midline, and thyroid not enlarged, symmetric, no tenderness/mass/nodules Lymph nodes: Cervical,  supraclavicular, and axillary nodes normal. Resp: clear to auscultation bilaterally Back: symmetric, no curvature. ROM normal. No CVA tenderness. Cardio: regular rate and rhythm, S1, S2 normal, no murmur, click, rub or gallop GI: soft, non-tender; bowel sounds normal; no masses,  no organomegaly Extremities: extremities normal, atraumatic, no cyanosis or edema  ECOG PERFORMANCE STATUS: 1 - Symptomatic but completely ambulatory  Blood pressure 123/69, pulse 72, temperature 97.9 F (36.6 C), temperature source Oral, resp. rate 18, height 5\' 2"  (1.575 m), weight (!) 311 lb 14.4 oz (141.5 kg), last menstrual period 05/02/2017, SpO2 94 %.  LABORATORY DATA: Lab Results  Component Value Date   WBC 4.2 10/31/2022   HGB 12.3 10/31/2022   HCT 38.0 10/31/2022   MCV 86.6 10/31/2022   PLT 337 10/31/2022      Chemistry      Component Value Date/Time   NA 141 10/24/2022 1334   NA 145 09/08/2021 0000   K 3.8 10/24/2022 1334   CL 106 10/24/2022 1334   CO2 27 10/24/2022 1334   BUN 19 10/24/2022 1334   BUN 36 (H) 08/22/2021 1606   CREATININE 0.92 10/24/2022 1334   CREATININE 1.13 (H)  12/24/2018 1402   GLU 97 09/08/2021 0000      Component Value Date/Time   CALCIUM 9.7 10/24/2022 1334   ALKPHOS 79 10/24/2022 1334   AST 29 10/24/2022 1334   ALT 47 (H) 10/24/2022 1334   BILITOT 0.4 10/24/2022 1334       RADIOGRAPHIC STUDIES: MR Brain W Wo Contrast  Result Date: 10/10/2022 CLINICAL DATA:  Lung cancer screening. Metastatic disease evaluation. EXAM: MRI HEAD WITHOUT AND WITH CONTRAST TECHNIQUE: Multiplanar, multiecho pulse sequences of the brain and surrounding structures were obtained without and with intravenous contrast. CONTRAST:  10mL GADAVIST GADOBUTROL 1 MMOL/ML IV SOLN COMPARISON:  None Available. FINDINGS: Brain: The brain has a normal appearance without evidence of malformation, atrophy, old or acute small or large vessel infarction, mass lesion, hemorrhage, hydrocephalus or extra-axial collection. After contrast administration, no abnormal enhancement occurs. Vascular: Major vessels at the base of the brain show flow. Venous sinuses appear patent. Skull and upper cervical spine: No definite osseous metastatic lesion is seen. There is an indeterminate focus of low T1 signal within the marrow of the lower clivus. I cannot rule out the possibility of an isolated bone metastasis but this would be relatively unlikely unless the patient has advanced stage disease. Sinuses/Orbits: Paranasal sinuses are clear. Orbits appear normal. Question nasal polyps in the posterior nasal passages, nonobstructive. Other: None significant. IMPRESSION: 1. Normal appearance of the brain itself. No evidence of metastatic disease. 2. No definite osseous metastatic disease. Small focus of low T1 signal within the marrow of the lower clivus. I cannot rule out the possibility of an isolated bone metastasis but this would be relatively unlikely unless the patient has advanced stage disease. Electronically Signed   By: [name] Fusi M.D.   On: 10/10/2022 08:22    ASSESSMENT AND PLAN: This is a very  pleasant 57 years old African-American female with  Stage IV non-small cell lung cancer, adenocarcinoma (T2a, N3, M1c).  She presented with bilateral lung nodules, thoracic adenopathy and bilobar liver lesions.  She was diagnosed in May 2024.   She has no actionable mutations and PD-L1 expression was negative. The patient is currently undergoing systemic chemoimmunotherapy with carboplatin for AUC 5, Alimta 500 Mg/M2 and Keytruda 200 Mg IV every 3 weeks status post 1 cycle.  She tolerated the first cycle of  her treatment fairly well with no concerning adverse effects except for mild fatigue.  I recommended for the patient to proceed with cycle #2 today as planned. I will see her back for follow-up visit in around 3 weeks before starting cycle #3. I will consider repeating her imaging studies after cycle #3 for restaging of her disease. The patient was advised to call immediately if she has any other concerning symptoms in the interval. The patient voices understanding of current disease status and treatment options and is in agreement with the current care plan.  All questions were answered. The patient knows to call the clinic with any problems, questions or concerns. We can certainly see the patient much sooner if necessary.  The total time spent in the appointment was 20 minutes.  Disclaimer: This note was dictated with voice recognition software. Similar sounding words can inadvertently be transcribed and may not be corrected upon review.

## 2022-11-03 ENCOUNTER — Encounter: Payer: Self-pay | Admitting: Internal Medicine

## 2022-11-07 ENCOUNTER — Other Ambulatory Visit: Payer: Self-pay | Admitting: Physician Assistant

## 2022-11-07 ENCOUNTER — Telehealth (INDEPENDENT_AMBULATORY_CARE_PROVIDER_SITE_OTHER): Payer: BC Managed Care – PPO | Admitting: Family Medicine

## 2022-11-07 ENCOUNTER — Other Ambulatory Visit: Payer: Self-pay

## 2022-11-07 ENCOUNTER — Inpatient Hospital Stay: Payer: BC Managed Care – PPO

## 2022-11-07 DIAGNOSIS — E669 Obesity, unspecified: Secondary | ICD-10-CM

## 2022-11-07 DIAGNOSIS — C3492 Malignant neoplasm of unspecified part of left bronchus or lung: Secondary | ICD-10-CM

## 2022-11-07 DIAGNOSIS — Z5112 Encounter for antineoplastic immunotherapy: Secondary | ICD-10-CM | POA: Diagnosis not present

## 2022-11-07 DIAGNOSIS — Z6841 Body Mass Index (BMI) 40.0 and over, adult: Secondary | ICD-10-CM | POA: Diagnosis not present

## 2022-11-07 LAB — CBC WITH DIFFERENTIAL (CANCER CENTER ONLY)
Abs Immature Granulocytes: 0 10*3/uL (ref 0.00–0.07)
Basophils Absolute: 0 10*3/uL (ref 0.0–0.1)
Basophils Relative: 1 %
Eosinophils Absolute: 0 10*3/uL (ref 0.0–0.5)
Eosinophils Relative: 1 %
HCT: 36.3 % (ref 36.0–46.0)
Hemoglobin: 11.9 g/dL — ABNORMAL LOW (ref 12.0–15.0)
Immature Granulocytes: 0 %
Lymphocytes Relative: 58 %
Lymphs Abs: 1.1 10*3/uL (ref 0.7–4.0)
MCH: 28.4 pg (ref 26.0–34.0)
MCHC: 32.8 g/dL (ref 30.0–36.0)
MCV: 86.6 fL (ref 80.0–100.0)
Monocytes Absolute: 0.1 10*3/uL (ref 0.1–1.0)
Monocytes Relative: 5 %
Neutro Abs: 0.6 10*3/uL — ABNORMAL LOW (ref 1.7–7.7)
Neutrophils Relative %: 35 %
Platelet Count: 209 10*3/uL (ref 150–400)
RBC: 4.19 MIL/uL (ref 3.87–5.11)
RDW: 14.8 % (ref 11.5–15.5)
WBC Count: 1.8 10*3/uL — ABNORMAL LOW (ref 4.0–10.5)
nRBC: 0 % (ref 0.0–0.2)

## 2022-11-07 LAB — CMP (CANCER CENTER ONLY)
ALT: 140 U/L — ABNORMAL HIGH (ref 0–44)
AST: 73 U/L — ABNORMAL HIGH (ref 15–41)
Albumin: 3.7 g/dL (ref 3.5–5.0)
Alkaline Phosphatase: 77 U/L (ref 38–126)
Anion gap: 6 (ref 5–15)
BUN: 18 mg/dL (ref 6–20)
CO2: 33 mmol/L — ABNORMAL HIGH (ref 22–32)
Calcium: 9.9 mg/dL (ref 8.9–10.3)
Chloride: 103 mmol/L (ref 98–111)
Creatinine: 0.94 mg/dL (ref 0.44–1.00)
GFR, Estimated: >60 mL/min (ref >60)
Glucose, Bld: 114 mg/dL — ABNORMAL HIGH (ref 70–99)
Potassium: 4 mmol/L (ref 3.5–5.1)
Sodium: 142 mmol/L (ref 135–145)
Total Bilirubin: 0.6 mg/dL (ref 0.3–1.2)
Total Protein: 6.9 g/dL (ref 6.5–8.1)

## 2022-11-07 NOTE — Progress Notes (Unsigned)
Patient has had two rounds of chemo since our last appointment.  This last round she is having more GI issues like constipation and GI uneasiness.  Last treatment was 1 week ago and next three is in 2 weeks.  She has been able to eat mostly normally- has changed her intake of raw foods like fruits and vegetables.  Some days she doesn't have any desire to eat.

## 2022-11-09 ENCOUNTER — Encounter: Payer: Self-pay | Admitting: Internal Medicine

## 2022-11-09 NOTE — Progress Notes (Signed)
Patient left voicemail while I was out on PAL to discuss grant expenses.  Returned call to patient to discuss and she did not have access to her paperwork at the time to follow along. Advised she may contact me when it is more convenient for her and she has paperwork to follow along and make any notes needed. She verbalized understanding.  She has my card to do so and for any additional financial questions or concerns.

## 2022-11-09 NOTE — Progress Notes (Signed)
Patient called to discuss grant expense sheet.  Went over in detail each expense and what is needed and how to submit. She verbalized understanding. Answered questions she had.  She has my card for any additional financial questions or concerns.

## 2022-11-10 ENCOUNTER — Telehealth: Payer: Self-pay | Admitting: Medical Oncology

## 2022-11-10 NOTE — Telephone Encounter (Signed)
  I called pt back and she said she had a BM and the abdominal pressure is gone. I gave her some ideas for food choices for high protein and added fiber. I told her to call back if she has any new concerns.  -Pt LVM Thursday that she is experiencing  "abdominal pressure". Chemo last Tuesday. She asked for recommendations for  food choices

## 2022-11-14 ENCOUNTER — Other Ambulatory Visit: Payer: Self-pay

## 2022-11-14 ENCOUNTER — Inpatient Hospital Stay: Payer: BC Managed Care – PPO

## 2022-11-14 ENCOUNTER — Encounter (INDEPENDENT_AMBULATORY_CARE_PROVIDER_SITE_OTHER): Payer: Self-pay | Admitting: *Deleted

## 2022-11-14 DIAGNOSIS — Z5112 Encounter for antineoplastic immunotherapy: Secondary | ICD-10-CM | POA: Diagnosis not present

## 2022-11-14 DIAGNOSIS — C3492 Malignant neoplasm of unspecified part of left bronchus or lung: Secondary | ICD-10-CM

## 2022-11-14 LAB — CBC WITH DIFFERENTIAL (CANCER CENTER ONLY)
Abs Immature Granulocytes: 0.02 10*3/uL (ref 0.00–0.07)
Basophils Absolute: 0 10*3/uL (ref 0.0–0.1)
Basophils Relative: 0 %
Eosinophils Absolute: 0.1 10*3/uL (ref 0.0–0.5)
Eosinophils Relative: 2 %
HCT: 36.1 % (ref 36.0–46.0)
Hemoglobin: 11.8 g/dL — ABNORMAL LOW (ref 12.0–15.0)
Immature Granulocytes: 1 %
Lymphocytes Relative: 49 %
Lymphs Abs: 1.2 10*3/uL (ref 0.7–4.0)
MCH: 28.1 pg (ref 26.0–34.0)
MCHC: 32.7 g/dL (ref 30.0–36.0)
MCV: 86 fL (ref 80.0–100.0)
Monocytes Absolute: 0.4 10*3/uL (ref 0.1–1.0)
Monocytes Relative: 14 %
Neutro Abs: 0.9 10*3/uL — ABNORMAL LOW (ref 1.7–7.7)
Neutrophils Relative %: 34 %
Platelet Count: 139 10*3/uL — ABNORMAL LOW (ref 150–400)
RBC: 4.2 MIL/uL (ref 3.87–5.11)
RDW: 15.4 % (ref 11.5–15.5)
WBC Count: 2.5 10*3/uL — ABNORMAL LOW (ref 4.0–10.5)
nRBC: 0 % (ref 0.0–0.2)

## 2022-11-14 LAB — CMP (CANCER CENTER ONLY)
ALT: 107 U/L — ABNORMAL HIGH (ref 0–44)
AST: 45 U/L — ABNORMAL HIGH (ref 15–41)
Albumin: 3.9 g/dL (ref 3.5–5.0)
Alkaline Phosphatase: 78 U/L (ref 38–126)
Anion gap: 8 (ref 5–15)
BUN: 15 mg/dL (ref 6–20)
CO2: 30 mmol/L (ref 22–32)
Calcium: 9.9 mg/dL (ref 8.9–10.3)
Chloride: 104 mmol/L (ref 98–111)
Creatinine: 1.1 mg/dL — ABNORMAL HIGH (ref 0.44–1.00)
GFR, Estimated: 59 mL/min — ABNORMAL LOW (ref >60)
Glucose, Bld: 110 mg/dL — ABNORMAL HIGH (ref 70–99)
Potassium: 4.2 mmol/L (ref 3.5–5.1)
Sodium: 142 mmol/L (ref 135–145)
Total Bilirubin: 0.5 mg/dL (ref 0.3–1.2)
Total Protein: 6.9 g/dL (ref 6.5–8.1)

## 2022-11-18 NOTE — Progress Notes (Unsigned)
[facility] OFFICE PROGRESS NOTE  [name], [name], NP [address] [place] [address]  DIAGNOSIS: Stage IV non-small cell lung cancer, adenocarcinoma (T2a, N3, M1c).  She presented with bilateral lung nodules, thoracic adenopathy and bilobar liver lesions.  She was diagnosed in May 2024.     PDL1: 0   Guardant 360: Negative for any actionable mutations   Foundation One: No actionable mutations   PRIOR THERAPY: None  CURRENT THERAPY: Palliative systemic chemotherapy and immunotherapy with carboplatin for an AUC of 5, alimta 500 mg/m2, and Keytruda 200 mg IV every 3 weeks. First dose on 10/10/22. Status post 2 cycles.   INTERVAL HISTORY: Madison Palmer 57 y.o. female returns to the clinic today for a follow-up visit accompanied by her friend.  The patient is currently undergoing palliative systemic chemotherapy and immunotherapy for her lung cancer.  She is status post 2 cycles and has been tolerating it well without any major adverse side effects except for some fatigue.  She has some neutropenia on weekly labs but has not required any G-CSF injections. She denies any signs and symptoms of infection in the interval such as fever, skin infections, dysuria, unusual cough, sore throat, or diarrhea.  He has an occasional dry cough but states that this is nothing that is regular.  She also mentions that she had a bra on last night and thinks that the underwire made her feel little more short of breath.  She denies any significant shortness of breath at this time.  In the interval she did send a MyChart message with some abdominal pressure which improved with constipation management and Gas-X. She mentions she had a ache last night and took Tylenol.  She had her staging brain MRI last month which not show any metastatic disease to the brain.  She denies any associated neurological symptoms such as droop, balance changes, vision changes, or falls.  Does not have a headache  at this time.  She localizes her occasional headache to the right temporal region.  Today she denies any fever, chills, or night sweats.  Her appetite comes and goes.  Denies any chest pain or hemoptysis.  She denies any nausea, vomiting, diarrhea.  Mention she has some skin darkening on her face which is likely secondary to her Alimta.  She is here today for evaluation repeat blood work before undergoing cycle #3.    MEDICAL HISTORY: Past Medical History:  Diagnosis Date   Acute on chronic respiratory failure with hypoxia and hypercapnia (HCC) 09/15/2018   Anemia    Anxiety    Arrhythmia    tachycardia   Arthritis    Bipolar 1 disorder (HCC) 09/12/2018   Bipolar I disorder, current or most recent episode manic, with psychotic features (HCC)    Brief psychotic disorder (HCC) 08/22/2018   Chronic respiratory failure with hypoxia (HCC) 05/10/2018   Formatting of this note might be different from the original. Last Assessment & Plan:  Continue 1 L continuous on exertion and during sleep. On her next visit we will check OSA on CPAP/room air to decide whether she needs to take oxygen along with her on her cruise in May   Depression    Diverticulitis    Generalized edema 03/12/2018   GERD (gastroesophageal reflux disease)    Glaucoma    Hyperlipidemia    Hyperparathyroidism (HCC)    Hypertension    Inflammatory polyps of colon (HCC)    Leg swelling 01/14/2019   LVH (left  ventricular hypertrophy)    Lymphedema    Morbid (severe) obesity due to excess calories (HCC) 05/11/2017   PCOS (polycystic ovarian syndrome)    Prediabetes    Recurrent UTI    Sleep apnea    CPAP   Thyroid disease    Vitamin D deficiency     ALLERGIES:  is allergic to other, fentanyl, levofloxacin, midazolam, pollen extract, atorvastatin, hydralazine hcl, and rosuvastatin.  MEDICATIONS:  Current Outpatient Medications  Medication Sig Dispense Refill   acetaminophen (TYLENOL) 500 MG tablet Take 1,000 mg by  mouth 3 (three) times daily as needed for moderate pain or headache.     amLODipine (NORVASC) 10 MG tablet TAKE 1 TABLET BY MOUTH EVERY DAY 90 tablet 3   atorvastatin (LIPITOR) 20 MG tablet TAKE 1 TABLET(20 MG) BY MOUTH 3 TIMES A WEEK 12 tablet 5   benztropine (COGENTIN) 0.5 MG tablet Take 1 tablet (0.5 mg total) by mouth at bedtime. 30 tablet 2   cetirizine (ZYRTEC) 10 MG tablet TAKE 1 TABLET BY MOUTH EVERY DAY 90 tablet 1   Cholecalciferol (VITAMIN D3 MAXIMUM STRENGTH) 125 MCG (5000 UT) capsule Take 5,000 Units by mouth daily.     cinacalcet (SENSIPAR) 30 MG tablet Take 30 mg by mouth daily.     cloNIDine (CATAPRES) 0.1 MG tablet Take 0.1 mg by mouth 3 (three) times daily.     esomeprazole (NEXIUM) 40 MG capsule TAKE 1 CAPSULE BY MOUTH TWICE DAILY 180 capsule 2   fenofibrate (TRICOR) 145 MG tablet TAKE 1 TABLET(145 MG) BY MOUTH DAILY 90 tablet 3   folic acid (FOLVITE) 1 MG tablet Take 1 tablet (1 mg total) by mouth daily. 30 tablet 2   furosemide (LASIX) 40 MG tablet TAKE 1 TABLET BY MOUTH EVERY DAY 90 tablet 3   gabapentin (NEURONTIN) 100 MG capsule TAKE 2 CAPSULES(200 MG) BY MOUTH AT BEDTIME 180 capsule 3   labetalol (NORMODYNE) 300 MG tablet TAKE 1.5 TABLETS BY MOUTH TWICE DAILY 270 tablet 3   levothyroxine (SYNTHROID) 88 MCG tablet Take 88 mcg by mouth daily.     lisinopril (ZESTRIL) 40 MG tablet TAKE 1 TABLET BY MOUTH EVERY DAY 90 tablet 1   lurasidone (LATUDA) 40 MG TABS tablet Take 1 tablet (40 mg total) by mouth daily with breakfast. 30 tablet 2   oxyCODONE (OXY IR/ROXICODONE) 5 MG immediate release tablet 1 tablet 30 minutes before the MRI.  May repeat once if needed. 10 tablet 0   Polyethylene Glycol 3350 (MIRALAX PO) Take 1 Capful by mouth daily as needed (Constipation).     Potassium Chloride ER 20 MEQ TBCR TAKE 1 TABLET BY MOUTH EVERY DAY 90 tablet 2   prochlorperazine (COMPAZINE) 10 MG tablet Take 1 tablet (10 mg total) by mouth every 6 (six) hours as needed. 30 tablet 2    sucralfate (CARAFATE) 1 g tablet Take 1 tablet (1 g total) by mouth 3 (three) times daily before meals. 90 tablet 3   TART CHERRY PO Take 1 tablet by mouth daily.     No current facility-administered medications for this visit.    SURGICAL HISTORY:  Past Surgical History:  Procedure Laterality Date   BIOPSY  09/27/2022   Procedure: BIOPSY;  Surgeon: Mansouraty, Netty Starring., MD;  Location: [facility] ENDOSCOPY;  Service: Gastroenterology;;   BREAST BIOPSY  2015   CESAREAN SECTION  2004   CHOLECYSTECTOMY     COLONOSCOPY WITH PROPOFOL N/A 09/27/2022   Procedure: COLONOSCOPY WITH PROPOFOL;  Surgeon: Lemar Lofty., MD;  Location: [facility] ENDOSCOPY;  Service: Gastroenterology;  Laterality: N/A;   ESOPHAGOGASTRODUODENOSCOPY (EGD) WITH PROPOFOL N/A 09/27/2022   Procedure: ESOPHAGOGASTRODUODENOSCOPY (EGD) WITH PROPOFOL;  Surgeon: Meridee Score Netty Starring., MD;  Location: [facility] ENDOSCOPY;  Service: Gastroenterology;  Laterality: N/A;   INNER EAR SURGERY     ear and sinus surgery   LAPAROSCOPIC REPAIR AND REMOVAL OF GASTRIC BAND     MALONEY DILATION  09/27/2022   Procedure: MALONEY DILATION;  Surgeon: Lemar Lofty., MD;  Location: [facility] ENDOSCOPY;  Service: Gastroenterology;;   OOPHORECTOMY Left    POLYPECTOMY  09/27/2022   Procedure: POLYPECTOMY;  Surgeon: Lemar Lofty., MD;  Location: [facility] ENDOSCOPY;  Service: Gastroenterology;;   TONSILLECTOMY AND ADENOIDECTOMY      REVIEW OF SYSTEMS:   Review of Systems  Constitutional: Positive for fatigue, weight loss, and intermittently decreased appetite.  Negative for chills and fever.  HENT: Negative for mouth sores, nosebleeds, sore throat and trouble swallowing.   Eyes: Negative for eye problems and icterus.  Respiratory: Positive for mild intermittent cough. Negative for hemoptysis, shortness of breath (none at this time) and wheezing.   Cardiovascular: Negative for chest pain and leg swelling.  Gastrointestinal: Positive for intermittent  constipation. Negative for abdominal pain, diarrhea, nausea and vomiting.  Genitourinary: Negative for bladder incontinence, difficulty urinating, dysuria, frequency and hematuria.   Musculoskeletal: Negative for back pain, gait problem, neck pain and neck stiffness.  Skin: Negative for itching and rash.  Neurological: Positive for occasional headache. Negative for dizziness, extremity weakness, gait problem, light-headedness and seizures.  Hematological: Negative for adenopathy. Does not bruise/bleed easily.  Psychiatric/Behavioral: Negative for confusion, depression and sleep disturbance. The patient is not nervous/anxious.     PHYSICAL EXAMINATION:  Blood pressure (!) 146/127, pulse 75, temperature (!) 97.2 F (36.2 C), temperature source Oral, resp. rate 16, weight (!) 305 lb 1.6 oz (138.4 kg), last menstrual period 05/02/2017, SpO2 97%.  ECOG PERFORMANCE STATUS: 1-2  Physical Exam  Constitutional: Oriented to person, place, and time and well-developed, well-nourished, and in no distress.  HENT:  Head: Normocephalic and atraumatic.  Mouth/Throat: Oropharynx is clear and moist. No oropharyngeal exudate.  Eyes: Conjunctivae are normal. Right eye exhibits no discharge. Left eye exhibits no discharge. No scleral icterus.  Neck: Normal range of motion. Neck supple.  Cardiovascular: Normal rate, regular rhythm, normal heart sounds and intact distal pulses.   Pulmonary/Chest: Effort normal and breath sounds normal. No respiratory distress. No wheezes. No rales.  Abdominal: Soft. Bowel sounds are normal. Exhibits no distension and no mass. There is no tenderness.  Musculoskeletal: Normal range of motion. Exhibits no edema.  Lymphadenopathy:    No cervical adenopathy.  Neurological: Alert and oriented to person, place, and time. Exhibits normal muscle tone she was examined in the wheelchair. Has a cane for ambulation.  Skin: Skin is warm and dry. No rash noted. Not diaphoretic. No erythema.  No pallor.  Psychiatric: Mood, memory and judgment normal.  Vitals reviewed.    LABORATORY DATA: Lab Results  Component Value Date   WBC 3.8 (L) 11/21/2022   HGB 11.9 (L) 11/21/2022   HCT 36.0 11/21/2022   MCV 85.7 11/21/2022   PLT 363 11/21/2022      Chemistry      Component Value Date/Time   NA 141 11/21/2022 1046   NA 145 09/08/2021 0000   K 3.9 11/21/2022 1046   CL 105 11/21/2022 1046   CO2 29 11/21/2022 1046   BUN 17 11/21/2022 1046   BUN 36 (H)  08/22/2021 1606   CREATININE 1.06 (H) 11/21/2022 1046   CREATININE 1.13 (H) 12/24/2018 1402   GLU 97 09/08/2021 0000      Component Value Date/Time   CALCIUM 9.8 11/21/2022 1046   ALKPHOS 67 11/21/2022 1046   AST 24 11/21/2022 1046   ALT 38 11/21/2022 1046   BILITOT 0.5 11/21/2022 1046       RADIOGRAPHIC STUDIES:  No results found.   ASSESSMENT/PLAN:  This is a very pleasant 57 year old African-American female referred to clinic for what is felt to be stage IV non-small cell lung cancer, adenocarcinoma (T2a, N3, M1c).  She presented with bilateral lung nodules, thoracic adenopathy and bilobar liver lesions.  She was diagnosed in May 2024.  Her PD-L1 expression 0%.  She does not have any actionable mutations by Guardant360.  Her foundation 1 molecular testing is pending at this time.   Therefore, the patient is going to undergo palliative systemic chemotherapy with carboplatin for an AUC 5 and Alimta 500 mg/m and Keytruda 200 mg IV every 3 weeks. She is status post 2 cycles of treatment.    Labs were reviewed. Her ANC is 1.8.  I reviewed her labs with Dr. Arbutus Ped.  I also talked to the insurance authorization team earlier today.  If we added Neulasta or biosimilar would require PA.  Therefore Dr. Arbutus Ped recommends keeping the doses of her chemotherapy the same today with close monitoring of her labs.  We discussed neutropenic precautions and the importance of calling us should she ever develop signs of infection. We  discussed pandemic precautions, masking, social distancing, washing fruits/veggies, and avoiding raw seafood/meat. Should her ANC be <0.6, we would consider GCSF injections.   Labs were reviewed. Recommend she proceed with cycle #3 today as scheduled.    We will see her back in 3 weeks for evaluation and repeat blood work before starting cycle #4.    I will arrange for a restaging CT scan of the CAP prior to her next cycle of treatment and we will review it with her at the next appointment.   We reviewed constipation education.  Regarding her headache, her brain MRI from 1 month ago did not show any evidence of metastatic disease to the brain.  Denies any headache at this time she knows Tylenol would be preferred.  However, caution should she ever develop any unusual headaches with any neurologic symptoms such as speech changes, falls, balance changes, facial droop, extremity weakness, etc. that that is a medical emergency that would warrant emergency room evaluation.  Let her know that the skin darkening on her face could be secondary to her Alimta chemotherapy.  The patient was advised to call immediately if she has any concerning symptoms in the interval. The patient voices understanding of current disease status and treatment options and is in agreement with the current care plan. All questions were answered. The patient knows to call the clinic with any problems, questions or concerns. We can certainly see the patient much sooner if necessary       Orders Placed This Encounter  Procedures   CT CHEST ABDOMEN PELVIS W CONTRAST    Standing Status:   Future    Standing Expiration Date:   11/21/2023    Order Specific Question:   If indicated for the ordered procedure, I authorize the administration of contrast media per Radiology protocol    Answer:   Yes    Order Specific Question:   Does the patient have a contrast media/X-ray dye  allergy?    Answer:   No    Order Specific Question:    Is patient pregnant?    Answer:   No    Order Specific Question:   Preferred imaging location?    Answer:   [facility]    Order Specific Question:   If indicated for the ordered procedure, I authorize the administration of oral contrast media per Radiology protocol    Answer:   Yes    The total time spent in the appointment was 20-29 minutes  Jyron Turman L Janalyn Higby, PA-C 11/21/22

## 2022-11-20 MED FILL — Dexamethasone Sodium Phosphate Inj 100 MG/10ML: INTRAMUSCULAR | Qty: 1 | Status: AC

## 2022-11-20 MED FILL — Fosaprepitant Dimeglumine For IV Infusion 150 MG (Base Eq): INTRAVENOUS | Qty: 5 | Status: AC

## 2022-11-21 ENCOUNTER — Other Ambulatory Visit: Payer: Self-pay

## 2022-11-21 ENCOUNTER — Inpatient Hospital Stay: Payer: BC Managed Care – PPO

## 2022-11-21 ENCOUNTER — Inpatient Hospital Stay (HOSPITAL_BASED_OUTPATIENT_CLINIC_OR_DEPARTMENT_OTHER): Payer: BC Managed Care – PPO | Admitting: Physician Assistant

## 2022-11-21 VITALS — BP 117/63 | HR 67 | Resp 16

## 2022-11-21 DIAGNOSIS — C3492 Malignant neoplasm of unspecified part of left bronchus or lung: Secondary | ICD-10-CM

## 2022-11-21 DIAGNOSIS — Z5112 Encounter for antineoplastic immunotherapy: Secondary | ICD-10-CM | POA: Diagnosis not present

## 2022-11-21 LAB — CBC WITH DIFFERENTIAL (CANCER CENTER ONLY)
Abs Immature Granulocytes: 0.04 10*3/uL (ref 0.00–0.07)
Basophils Absolute: 0 10*3/uL (ref 0.0–0.1)
Basophils Relative: 1 %
Eosinophils Absolute: 0.1 10*3/uL (ref 0.0–0.5)
Eosinophils Relative: 2 %
HCT: 36 % (ref 36.0–46.0)
Hemoglobin: 11.9 g/dL — ABNORMAL LOW (ref 12.0–15.0)
Immature Granulocytes: 1 %
Lymphocytes Relative: 40 %
Lymphs Abs: 1.5 10*3/uL (ref 0.7–4.0)
MCH: 28.3 pg (ref 26.0–34.0)
MCHC: 33.1 g/dL (ref 30.0–36.0)
MCV: 85.7 fL (ref 80.0–100.0)
Monocytes Absolute: 0.4 10*3/uL (ref 0.1–1.0)
Monocytes Relative: 10 %
Neutro Abs: 1.8 10*3/uL (ref 1.7–7.7)
Neutrophils Relative %: 46 %
Platelet Count: 363 10*3/uL (ref 150–400)
RBC: 4.2 MIL/uL (ref 3.87–5.11)
RDW: 16.4 % — ABNORMAL HIGH (ref 11.5–15.5)
WBC Count: 3.8 10*3/uL — ABNORMAL LOW (ref 4.0–10.5)
nRBC: 0 % (ref 0.0–0.2)

## 2022-11-21 LAB — CMP (CANCER CENTER ONLY)
ALT: 38 U/L (ref 0–44)
AST: 24 U/L (ref 15–41)
Albumin: 3.9 g/dL (ref 3.5–5.0)
Alkaline Phosphatase: 67 U/L (ref 38–126)
Anion gap: 7 (ref 5–15)
BUN: 17 mg/dL (ref 6–20)
CO2: 29 mmol/L (ref 22–32)
Calcium: 9.8 mg/dL (ref 8.9–10.3)
Chloride: 105 mmol/L (ref 98–111)
Creatinine: 1.06 mg/dL — ABNORMAL HIGH (ref 0.44–1.00)
GFR, Estimated: >60 mL/min (ref >60)
Glucose, Bld: 113 mg/dL — ABNORMAL HIGH (ref 70–99)
Potassium: 3.9 mmol/L (ref 3.5–5.1)
Sodium: 141 mmol/L (ref 135–145)
Total Bilirubin: 0.5 mg/dL (ref 0.3–1.2)
Total Protein: 6.5 g/dL (ref 6.5–8.1)

## 2022-11-21 LAB — TSH: TSH: 3.194 u[IU]/mL (ref 0.350–4.500)

## 2022-11-21 MED ORDER — SODIUM CHLORIDE 0.9 % IV SOLN
200.0000 mg | Freq: Once | INTRAVENOUS | Status: AC
  Administered 2022-11-21: 200 mg via INTRAVENOUS
  Filled 2022-11-21: qty 200

## 2022-11-21 MED ORDER — SODIUM CHLORIDE 0.9 % IV SOLN: Freq: Once | INTRAVENOUS | Status: AC

## 2022-11-21 MED ORDER — CYANOCOBALAMIN 1000 MCG/ML IJ SOLN
1000.0000 ug | Freq: Once | INTRAMUSCULAR | Status: AC
  Administered 2022-11-21: 1000 ug via INTRAMUSCULAR
  Filled 2022-11-21: qty 1

## 2022-11-21 MED ORDER — SODIUM CHLORIDE 0.9 % IV SOLN
750.0000 mg | Freq: Once | INTRAVENOUS | Status: AC
  Administered 2022-11-21: 750 mg via INTRAVENOUS
  Filled 2022-11-21: qty 75

## 2022-11-21 MED ORDER — PALONOSETRON HCL INJECTION 0.25 MG/5ML
0.2500 mg | Freq: Once | INTRAVENOUS | Status: AC
  Administered 2022-11-21: 0.25 mg via INTRAVENOUS
  Filled 2022-11-21: qty 5

## 2022-11-21 MED ORDER — SODIUM CHLORIDE 0.9 % IV SOLN
10.0000 mg | Freq: Once | INTRAVENOUS | Status: AC
  Administered 2022-11-21: 10 mg via INTRAVENOUS
  Filled 2022-11-21: qty 10

## 2022-11-21 MED ORDER — SODIUM CHLORIDE 0.9 % IV SOLN
150.0000 mg | Freq: Once | INTRAVENOUS | Status: AC
  Administered 2022-11-21: 150 mg via INTRAVENOUS
  Filled 2022-11-21: qty 150

## 2022-11-21 MED ORDER — SODIUM CHLORIDE 0.9 % IV SOLN
500.0000 mg/m2 | Freq: Once | INTRAVENOUS | Status: AC
  Administered 2022-11-21: 1200 mg via INTRAVENOUS
  Filled 2022-11-21: qty 40

## 2022-11-21 NOTE — Patient Instructions (Signed)
[facility]  Discharge Instructions: Thank you for choosing [facility] to provide your oncology and hematology care.   If you have a lab appointment with the Cancer Center, please go directly to the Cancer Center and check in at the registration area.   Wear comfortable clothing and clothing appropriate for easy access to any Portacath or PICC line.   We strive to give you quality time with your provider. You may need to reschedule your appointment if you arrive late (15 or more minutes).  Arriving late affects you and other patients whose appointments are after yours.  Also, if you miss three or more appointments without notifying the office, you may be dismissed from the clinic at the provider's discretion.      For prescription refill requests, have your pharmacy contact our office and allow 72 hours for refills to be completed.    Today you received the following chemotherapy and/or immunotherapy agents Keytruda, Alimta, Carboplatin.      To help prevent nausea and vomiting after your treatment, we encourage you to take your nausea medication as directed.  BELOW ARE SYMPTOMS THAT SHOULD BE REPORTED IMMEDIATELY: *FEVER GREATER THAN 100.4 F (38 C) OR HIGHER *CHILLS OR SWEATING *NAUSEA AND VOMITING THAT IS NOT CONTROLLED WITH YOUR NAUSEA MEDICATION *UNUSUAL SHORTNESS OF BREATH *UNUSUAL BRUISING OR BLEEDING *URINARY PROBLEMS (pain or burning when urinating, or frequent urination) *BOWEL PROBLEMS (unusual diarrhea, constipation, pain near the anus) TENDERNESS IN MOUTH AND THROAT WITH OR WITHOUT PRESENCE OF ULCERS (sore throat, sores in mouth, or a toothache) UNUSUAL RASH, SWELLING OR PAIN  UNUSUAL VAGINAL DISCHARGE OR ITCHING   Items with * indicate a potential emergency and should be followed up as soon as possible or go to the Emergency Department if any problems should occur.  Please show the CHEMOTHERAPY ALERT CARD or IMMUNOTHERAPY  ALERT CARD at check-in to the Emergency Department and triage nurse.  Should you have questions after your visit or need to cancel or reschedule your appointment, please contact [facility]  Dept: [phone]  and follow the prompts.  Office hours are 8:00 a.m. to 4:30 p.m. Monday - Friday. Please note that voicemails left after 4:00 p.m. may not be returned until the following business day.  We are closed weekends and major holidays. You have access to a nurse at all times for urgent questions. Please call the main number to the clinic Dept: [phone] and follow the prompts.   For any non-urgent questions, you may also contact your provider using MyChart. We now offer e-Visits for anyone 18 and older to request care online for non-urgent symptoms. For details visit [email or web address].   Also download the MyChart app! Go to the app store, search "MyChart", open the app, select , and log in with your MyChart username and password.   

## 2022-11-22 LAB — T4: T4, Total: 11.4 ug/dL (ref 4.5–12.0)

## 2022-11-28 ENCOUNTER — Other Ambulatory Visit (HOSPITAL_BASED_OUTPATIENT_CLINIC_OR_DEPARTMENT_OTHER): Payer: Self-pay | Admitting: Cardiology

## 2022-11-28 ENCOUNTER — Other Ambulatory Visit: Payer: Self-pay

## 2022-11-28 ENCOUNTER — Inpatient Hospital Stay: Payer: BC Managed Care – PPO

## 2022-11-28 DIAGNOSIS — C3492 Malignant neoplasm of unspecified part of left bronchus or lung: Secondary | ICD-10-CM

## 2022-11-28 DIAGNOSIS — Z5112 Encounter for antineoplastic immunotherapy: Secondary | ICD-10-CM | POA: Diagnosis not present

## 2022-11-28 LAB — CMP (CANCER CENTER ONLY)
ALT: 61 U/L — ABNORMAL HIGH (ref 0–44)
AST: 50 U/L — ABNORMAL HIGH (ref 15–41)
Albumin: 3.9 g/dL (ref 3.5–5.0)
Alkaline Phosphatase: 70 U/L (ref 38–126)
Anion gap: 7 (ref 5–15)
BUN: 15 mg/dL (ref 6–20)
CO2: 31 mmol/L (ref 22–32)
Calcium: 9.6 mg/dL (ref 8.9–10.3)
Chloride: 103 mmol/L (ref 98–111)
Creatinine: 0.89 mg/dL (ref 0.44–1.00)
GFR, Estimated: >60 mL/min (ref >60)
Glucose, Bld: 121 mg/dL — ABNORMAL HIGH (ref 70–99)
Potassium: 3.8 mmol/L (ref 3.5–5.1)
Sodium: 141 mmol/L (ref 135–145)
Total Bilirubin: 0.7 mg/dL (ref 0.3–1.2)
Total Protein: 6.7 g/dL (ref 6.5–8.1)

## 2022-11-28 LAB — CBC WITH DIFFERENTIAL (CANCER CENTER ONLY)
Abs Immature Granulocytes: 0.01 10*3/uL (ref 0.00–0.07)
Basophils Absolute: 0 10*3/uL (ref 0.0–0.1)
Basophils Relative: 1 %
Eosinophils Absolute: 0 10*3/uL (ref 0.0–0.5)
Eosinophils Relative: 1 %
HCT: 34.1 % — ABNORMAL LOW (ref 36.0–46.0)
Hemoglobin: 11.5 g/dL — ABNORMAL LOW (ref 12.0–15.0)
Immature Granulocytes: 1 %
Lymphocytes Relative: 58 %
Lymphs Abs: 1 10*3/uL (ref 0.7–4.0)
MCH: 28.8 pg (ref 26.0–34.0)
MCHC: 33.7 g/dL (ref 30.0–36.0)
MCV: 85.5 fL (ref 80.0–100.0)
Monocytes Absolute: 0.1 10*3/uL (ref 0.1–1.0)
Monocytes Relative: 7 %
Neutro Abs: 0.6 10*3/uL — ABNORMAL LOW (ref 1.7–7.7)
Neutrophils Relative %: 32 %
Platelet Count: 204 10*3/uL (ref 150–400)
RBC: 3.99 MIL/uL (ref 3.87–5.11)
RDW: 15.7 % — ABNORMAL HIGH (ref 11.5–15.5)
WBC Count: 1.8 10*3/uL — ABNORMAL LOW (ref 4.0–10.5)
nRBC: 0 % (ref 0.0–0.2)

## 2022-11-30 ENCOUNTER — Telehealth: Payer: Self-pay

## 2022-11-30 NOTE — Telephone Encounter (Signed)
Attempted to contact pt X2 to review Neutropenic precautions. Left message for pt to return call.

## 2022-12-05 ENCOUNTER — Other Ambulatory Visit: Payer: BC Managed Care – PPO

## 2022-12-06 ENCOUNTER — Encounter (HOSPITAL_COMMUNITY): Payer: Self-pay

## 2022-12-06 ENCOUNTER — Ambulatory Visit (HOSPITAL_COMMUNITY)
Admission: RE | Admit: 2022-12-06 | Discharge: 2022-12-06 | Disposition: A | Discharge status: Home / Self Care | Payer: BC Managed Care – PPO | Source: Ambulatory Visit | Attending: Physician Assistant | Admitting: Physician Assistant

## 2022-12-06 ENCOUNTER — Other Ambulatory Visit: Payer: Self-pay

## 2022-12-06 ENCOUNTER — Inpatient Hospital Stay: Payer: BC Managed Care – PPO | Attending: Physician Assistant

## 2022-12-06 DIAGNOSIS — Z5189 Encounter for other specified aftercare: Secondary | ICD-10-CM | POA: Diagnosis not present

## 2022-12-06 DIAGNOSIS — Z5112 Encounter for antineoplastic immunotherapy: Secondary | ICD-10-CM | POA: Insufficient documentation

## 2022-12-06 DIAGNOSIS — C3492 Malignant neoplasm of unspecified part of left bronchus or lung: Secondary | ICD-10-CM | POA: Diagnosis present

## 2022-12-06 DIAGNOSIS — C787 Secondary malignant neoplasm of liver and intrahepatic bile duct: Secondary | ICD-10-CM | POA: Insufficient documentation

## 2022-12-06 DIAGNOSIS — Z79899 Other long term (current) drug therapy: Secondary | ICD-10-CM | POA: Insufficient documentation

## 2022-12-06 DIAGNOSIS — Z5111 Encounter for antineoplastic chemotherapy: Secondary | ICD-10-CM | POA: Insufficient documentation

## 2022-12-06 DIAGNOSIS — C3490 Malignant neoplasm of unspecified part of unspecified bronchus or lung: Secondary | ICD-10-CM | POA: Insufficient documentation

## 2022-12-06 LAB — CMP (CANCER CENTER ONLY)
ALT: 59 U/L — ABNORMAL HIGH (ref 0–44)
AST: 34 U/L (ref 15–41)
Albumin: 4 g/dL (ref 3.5–5.0)
Alkaline Phosphatase: 76 U/L (ref 38–126)
Anion gap: 8 (ref 5–15)
BUN: 12 mg/dL (ref 6–20)
CO2: 30 mmol/L (ref 22–32)
Calcium: 9.1 mg/dL (ref 8.9–10.3)
Chloride: 103 mmol/L (ref 98–111)
Creatinine: 1.09 mg/dL — ABNORMAL HIGH (ref 0.44–1.00)
GFR, Estimated: 60 mL/min — ABNORMAL LOW (ref >60)
Glucose, Bld: 165 mg/dL — ABNORMAL HIGH (ref 70–99)
Potassium: 3.7 mmol/L (ref 3.5–5.1)
Sodium: 141 mmol/L (ref 135–145)
Total Bilirubin: 0.4 mg/dL (ref 0.3–1.2)
Total Protein: 7 g/dL (ref 6.5–8.1)

## 2022-12-06 LAB — CBC WITH DIFFERENTIAL (CANCER CENTER ONLY)
Abs Immature Granulocytes: 0.02 10*3/uL (ref 0.00–0.07)
Basophils Absolute: 0 10*3/uL (ref 0.0–0.1)
Basophils Relative: 0 %
Eosinophils Absolute: 0 10*3/uL (ref 0.0–0.5)
Eosinophils Relative: 1 %
HCT: 33.3 % — ABNORMAL LOW (ref 36.0–46.0)
Hemoglobin: 11.1 g/dL — ABNORMAL LOW (ref 12.0–15.0)
Immature Granulocytes: 1 %
Lymphocytes Relative: 43 %
Lymphs Abs: 1.2 10*3/uL (ref 0.7–4.0)
MCH: 28.8 pg (ref 26.0–34.0)
MCHC: 33.3 g/dL (ref 30.0–36.0)
MCV: 86.5 fL (ref 80.0–100.0)
Monocytes Absolute: 0.3 10*3/uL (ref 0.1–1.0)
Monocytes Relative: 10 %
Neutro Abs: 1.2 10*3/uL — ABNORMAL LOW (ref 1.7–7.7)
Neutrophils Relative %: 45 %
Platelet Count: 144 10*3/uL — ABNORMAL LOW (ref 150–400)
RBC: 3.85 MIL/uL — ABNORMAL LOW (ref 3.87–5.11)
RDW: 16.5 % — ABNORMAL HIGH (ref 11.5–15.5)
WBC Count: 2.7 10*3/uL — ABNORMAL LOW (ref 4.0–10.5)
nRBC: 0 % (ref 0.0–0.2)

## 2022-12-06 MED ORDER — IOHEXOL 300 MG/ML  SOLN
100.0000 mL | Freq: Once | INTRAMUSCULAR | Status: AC | PRN
  Administered 2022-12-06: 100 mL via INTRAVENOUS

## 2022-12-06 MED ORDER — SODIUM CHLORIDE (PF) 0.9 % IJ SOLN
INTRAMUSCULAR | Status: AC
  Filled 2022-12-06: qty 50

## 2022-12-11 MED FILL — Fosaprepitant Dimeglumine For IV Infusion 150 MG (Base Eq): INTRAVENOUS | Qty: 5 | Status: AC

## 2022-12-11 MED FILL — Dexamethasone Sodium Phosphate Inj 100 MG/10ML: INTRAMUSCULAR | Qty: 1 | Status: AC

## 2022-12-12 ENCOUNTER — Inpatient Hospital Stay: Payer: BC Managed Care – PPO

## 2022-12-12 ENCOUNTER — Encounter: Payer: Self-pay | Admitting: Internal Medicine

## 2022-12-12 ENCOUNTER — Inpatient Hospital Stay (HOSPITAL_BASED_OUTPATIENT_CLINIC_OR_DEPARTMENT_OTHER): Payer: BC Managed Care – PPO | Admitting: Internal Medicine

## 2022-12-12 ENCOUNTER — Other Ambulatory Visit: Payer: Self-pay

## 2022-12-12 ENCOUNTER — Other Ambulatory Visit: Payer: Self-pay | Admitting: Nurse Practitioner

## 2022-12-12 DIAGNOSIS — E782 Mixed hyperlipidemia: Secondary | ICD-10-CM

## 2022-12-12 DIAGNOSIS — Z5112 Encounter for antineoplastic immunotherapy: Secondary | ICD-10-CM | POA: Diagnosis not present

## 2022-12-12 DIAGNOSIS — C3492 Malignant neoplasm of unspecified part of left bronchus or lung: Secondary | ICD-10-CM

## 2022-12-12 LAB — CBC WITH DIFFERENTIAL (CANCER CENTER ONLY)
Abs Immature Granulocytes: 0.04 10*3/uL (ref 0.00–0.07)
Basophils Absolute: 0 10*3/uL (ref 0.0–0.1)
Basophils Relative: 1 %
Eosinophils Absolute: 0.1 10*3/uL (ref 0.0–0.5)
Eosinophils Relative: 2 %
HCT: 34.3 % — ABNORMAL LOW (ref 36.0–46.0)
Hemoglobin: 11.5 g/dL — ABNORMAL LOW (ref 12.0–15.0)
Immature Granulocytes: 1 %
Lymphocytes Relative: 42 %
Lymphs Abs: 1.5 10*3/uL (ref 0.7–4.0)
MCH: 29.2 pg (ref 26.0–34.0)
MCHC: 33.5 g/dL (ref 30.0–36.0)
MCV: 87.1 fL (ref 80.0–100.0)
Monocytes Absolute: 0.4 10*3/uL (ref 0.1–1.0)
Monocytes Relative: 10 %
Neutro Abs: 1.5 10*3/uL — ABNORMAL LOW (ref 1.7–7.7)
Neutrophils Relative %: 44 %
Platelet Count: 261 10*3/uL (ref 150–400)
RBC: 3.94 MIL/uL (ref 3.87–5.11)
RDW: 17.4 % — ABNORMAL HIGH (ref 11.5–15.5)
WBC Count: 3.5 10*3/uL — ABNORMAL LOW (ref 4.0–10.5)
nRBC: 0 % (ref 0.0–0.2)

## 2022-12-12 LAB — CMP (CANCER CENTER ONLY)
ALT: 24 U/L (ref 0–44)
AST: 19 U/L (ref 15–41)
Albumin: 3.9 g/dL (ref 3.5–5.0)
Alkaline Phosphatase: 74 U/L (ref 38–126)
Anion gap: 10 (ref 5–15)
BUN: 9 mg/dL (ref 6–20)
CO2: 27 mmol/L (ref 22–32)
Calcium: 9.3 mg/dL (ref 8.9–10.3)
Chloride: 105 mmol/L (ref 98–111)
Creatinine: 0.96 mg/dL (ref 0.44–1.00)
GFR, Estimated: >60 mL/min (ref >60)
Glucose, Bld: 155 mg/dL — ABNORMAL HIGH (ref 70–99)
Potassium: 3.8 mmol/L (ref 3.5–5.1)
Sodium: 142 mmol/L (ref 135–145)
Total Bilirubin: 0.4 mg/dL (ref 0.3–1.2)
Total Protein: 6.9 g/dL (ref 6.5–8.1)

## 2022-12-12 MED ORDER — SODIUM CHLORIDE 0.9 % IV SOLN
10.0000 mg | Freq: Once | INTRAVENOUS | Status: AC
  Administered 2022-12-12: 10 mg via INTRAVENOUS
  Filled 2022-12-12: qty 10

## 2022-12-12 MED ORDER — PALONOSETRON HCL INJECTION 0.25 MG/5ML
0.2500 mg | Freq: Once | INTRAVENOUS | Status: AC
  Administered 2022-12-12: 0.25 mg via INTRAVENOUS
  Filled 2022-12-12: qty 5

## 2022-12-12 MED ORDER — SODIUM CHLORIDE 0.9 % IV SOLN
150.0000 mg | Freq: Once | INTRAVENOUS | Status: AC
  Administered 2022-12-12: 150 mg via INTRAVENOUS
  Filled 2022-12-12: qty 150

## 2022-12-12 MED ORDER — SODIUM CHLORIDE 0.9 % IV SOLN: Freq: Once | INTRAVENOUS | Status: AC

## 2022-12-12 MED ORDER — SODIUM CHLORIDE 0.9 % IV SOLN
200.0000 mg | Freq: Once | INTRAVENOUS | Status: AC
  Administered 2022-12-12: 200 mg via INTRAVENOUS
  Filled 2022-12-12: qty 200

## 2022-12-12 MED ORDER — SODIUM CHLORIDE 0.9 % IV SOLN
500.0000 mg/m2 | Freq: Once | INTRAVENOUS | Status: AC
  Administered 2022-12-12: 1200 mg via INTRAVENOUS
  Filled 2022-12-12: qty 40

## 2022-12-12 MED ORDER — SODIUM CHLORIDE 0.9 % IV SOLN
750.0000 mg | Freq: Once | INTRAVENOUS | Status: AC
  Administered 2022-12-12: 750 mg via INTRAVENOUS
  Filled 2022-12-12: qty 75

## 2022-12-12 NOTE — Progress Notes (Signed)
[facility] [phone]   [phone]  OFFICE PROGRESS NOTE  [name], [name], NP [address] [place] [address]   DIAGNOSIS: Stage IV non-small cell lung cancer, adenocarcinoma (T2a, N3, M1c).  She presented with bilateral lung nodules, thoracic adenopathy and bilobar liver lesions.  She was diagnosed in May 2024.     PDL1: 0   Guardant 360: Negative for any actionable mutations   Foundation One: No actionable mutations    PRIOR THERAPY: None   CURRENT THERAPY: Palliative systemic chemotherapy and immunotherapy with carboplatin for an AUC of 5, alimta 500 mg/m2, and Keytruda 200 mg IV every 3 weeks. First dose on 10/10/22. Status post 3 cycles.  INTERVAL HISTORY: Madison Palmer 57 y.o. female returns to the clinic today for follow-up visit.  The patient is feeling fine today with no concerning complaints except for occasional nausea.  She denied having any current chest pain, shortness of breath, cough or hemoptysis.  She has no vomiting, abdominal pain, diarrhea or constipation.  She has no headache or visual changes.  She has no recent weight loss or night sweats.  She has been tolerating her treatment with systemic chemotherapy fairly well.  She had repeat CT scan of the chest, abdomen and pelvis performed recently and she is here for evaluation and discussion of her scan results.  MEDICAL HISTORY: Past Medical History:  Diagnosis Date   Acute on chronic respiratory failure with hypoxia and hypercapnia (HCC) 09/15/2018   Anemia    Anxiety    Arrhythmia    tachycardia   Arthritis    Bipolar 1 disorder (HCC) 09/12/2018   Bipolar I disorder, current or most recent episode manic, with psychotic features (HCC)    Brief psychotic disorder (HCC) 08/22/2018   Chronic respiratory failure with hypoxia (HCC) 05/10/2018   Formatting of this note might be different from the original. Last Assessment & Plan:  Continue 1 L continuous  on exertion and during sleep. On her next visit we will check OSA on CPAP/room air to decide whether she needs to take oxygen along with her on her cruise in May   Depression    Diverticulitis    Generalized edema 03/12/2018   GERD (gastroesophageal reflux disease)    Glaucoma    Hyperlipidemia    Hyperparathyroidism (HCC)    Hypertension    Inflammatory polyps of colon (HCC)    Leg swelling 01/14/2019   LVH (left ventricular hypertrophy)    Lymphedema    Morbid (severe) obesity due to excess calories (HCC) 05/11/2017   PCOS (polycystic ovarian syndrome)    Prediabetes    Recurrent UTI    Sleep apnea    CPAP   Thyroid disease    Vitamin D deficiency     ALLERGIES:  is allergic to other, fentanyl, levofloxacin, midazolam, pollen extract, atorvastatin, hydralazine hcl, and rosuvastatin.  MEDICATIONS:  Current Outpatient Medications  Medication Sig Dispense Refill   acetaminophen (TYLENOL) 500 MG tablet Take 1,000 mg by mouth 3 (three) times daily as needed for moderate pain or headache.     amLODipine (NORVASC) 10 MG tablet TAKE 1 TABLET BY MOUTH EVERY DAY 90 tablet 3   atorvastatin (LIPITOR) 20 MG tablet TAKE 1 TABLET(20 MG) BY MOUTH 3 TIMES A WEEK 12 tablet 5   benztropine (COGENTIN) 0.5 MG tablet Take 1 tablet (0.5 mg total) by mouth at bedtime. 30 tablet 2   cetirizine (ZYRTEC) 10 MG tablet TAKE 1  TABLET BY MOUTH EVERY DAY 90 tablet 1   Cholecalciferol (VITAMIN D3 MAXIMUM STRENGTH) 125 MCG (5000 UT) capsule Take 5,000 Units by mouth daily.     cinacalcet (SENSIPAR) 30 MG tablet Take 30 mg by mouth daily.     cloNIDine (CATAPRES) 0.1 MG tablet TAKE 1 TABLET(0.1 MG) BY MOUTH THREE TIMES DAILY 90 tablet 3   esomeprazole (NEXIUM) 40 MG capsule TAKE 1 CAPSULE BY MOUTH TWICE DAILY 180 capsule 2   fenofibrate (TRICOR) 145 MG tablet TAKE 1 TABLET(145 MG) BY MOUTH DAILY 90 tablet 3   folic acid (FOLVITE) 1 MG tablet Take 1 tablet (1 mg total) by mouth daily. 30 tablet 2   furosemide  (LASIX) 40 MG tablet TAKE 1 TABLET BY MOUTH EVERY DAY 90 tablet 3   gabapentin (NEURONTIN) 100 MG capsule TAKE 2 CAPSULES(200 MG) BY MOUTH AT BEDTIME 180 capsule 3   labetalol (NORMODYNE) 300 MG tablet TAKE 1.5 TABLETS BY MOUTH TWICE DAILY 270 tablet 3   levothyroxine (SYNTHROID) 88 MCG tablet Take 88 mcg by mouth daily.     lisinopril (ZESTRIL) 40 MG tablet TAKE 1 TABLET BY MOUTH EVERY DAY 90 tablet 1   lurasidone (LATUDA) 40 MG TABS tablet Take 1 tablet (40 mg total) by mouth daily with breakfast. 30 tablet 2   oxyCODONE (OXY IR/ROXICODONE) 5 MG immediate release tablet 1 tablet 30 minutes before the MRI.  May repeat once if needed. 10 tablet 0   Polyethylene Glycol 3350 (MIRALAX PO) Take 1 Capful by mouth daily as needed (Constipation).     Potassium Chloride ER 20 MEQ TBCR TAKE 1 TABLET BY MOUTH EVERY DAY 90 tablet 2   prochlorperazine (COMPAZINE) 10 MG tablet Take 1 tablet (10 mg total) by mouth every 6 (six) hours as needed. 30 tablet 2   sucralfate (CARAFATE) 1 g tablet Take 1 tablet (1 g total) by mouth 3 (three) times daily before meals. 90 tablet 3   TART CHERRY PO Take 1 tablet by mouth daily.     No current facility-administered medications for this visit.    SURGICAL HISTORY:  Past Surgical History:  Procedure Laterality Date   BIOPSY  09/27/2022   Procedure: BIOPSY;  Surgeon: Mansouraty, Netty Starring., MD;  Location: [facility] ENDOSCOPY;  Service: Gastroenterology;;   BREAST BIOPSY  2015   CESAREAN SECTION  2004   CHOLECYSTECTOMY     COLONOSCOPY WITH PROPOFOL N/A 09/27/2022   Procedure: COLONOSCOPY WITH PROPOFOL;  Surgeon: Lemar Lofty., MD;  Location: [facility] ENDOSCOPY;  Service: Gastroenterology;  Laterality: N/A;   ESOPHAGOGASTRODUODENOSCOPY (EGD) WITH PROPOFOL N/A 09/27/2022   Procedure: ESOPHAGOGASTRODUODENOSCOPY (EGD) WITH PROPOFOL;  Surgeon: Meridee Score Netty Starring., MD;  Location: [facility] ENDOSCOPY;  Service: Gastroenterology;  Laterality: N/A;   INNER EAR SURGERY     ear and  sinus surgery   LAPAROSCOPIC REPAIR AND REMOVAL OF GASTRIC BAND     MALONEY DILATION  09/27/2022   Procedure: MALONEY DILATION;  Surgeon: Lemar Lofty., MD;  Location: [facility] ENDOSCOPY;  Service: Gastroenterology;;   OOPHORECTOMY Left    POLYPECTOMY  09/27/2022   Procedure: POLYPECTOMY;  Surgeon: Lemar Lofty., MD;  Location: [facility] ENDOSCOPY;  Service: Gastroenterology;;   TONSILLECTOMY AND ADENOIDECTOMY      REVIEW OF SYSTEMS:  Constitutional: positive for fatigue Eyes: negative Ears, nose, mouth, throat, and face: negative Respiratory: negative Cardiovascular: negative Gastrointestinal: negative Genitourinary:negative Integument/breast: negative Hematologic/lymphatic: negative Musculoskeletal:negative Neurological: negative Behavioral/Psych: negative Endocrine: negative Allergic/Immunologic: negative   PHYSICAL EXAMINATION: General appearance: alert, cooperative, fatigued, and  no distress Head: Normocephalic, without obvious abnormality, atraumatic Neck: no adenopathy, no JVD, supple, symmetrical, trachea midline, and thyroid not enlarged, symmetric, no tenderness/mass/nodules Lymph nodes: Cervical, supraclavicular, and axillary nodes normal. Resp: clear to auscultation bilaterally Back: symmetric, no curvature. ROM normal. No CVA tenderness. Cardio: regular rate and rhythm, S1, S2 normal, no murmur, click, rub or gallop GI: soft, non-tender; bowel sounds normal; no masses,  no organomegaly Extremities: extremities normal, atraumatic, no cyanosis or edema Neurologic: Alert and oriented X 3, normal strength and tone. Normal symmetric reflexes. Normal coordination and gait  ECOG PERFORMANCE STATUS: 1 - Symptomatic but completely ambulatory  Blood pressure 119/72, pulse 76, temperature 97.9 F (36.6 C), temperature source Oral, resp. rate 17, height 5\' 2"  (1.575 m), weight (!) 305 lb (138.3 kg), last menstrual period 05/02/2017, SpO2 96%.  LABORATORY DATA: Lab  Results  Component Value Date   WBC 3.5 (L) 12/12/2022   HGB 11.5 (L) 12/12/2022   HCT 34.3 (L) 12/12/2022   MCV 87.1 12/12/2022   PLT 261 12/12/2022      Chemistry      Component Value Date/Time   NA 141 12/06/2022 1140   NA 145 09/08/2021 0000   K 3.7 12/06/2022 1140   CL 103 12/06/2022 1140   CO2 30 12/06/2022 1140   BUN 12 12/06/2022 1140   BUN 36 (H) 08/22/2021 1606   CREATININE 1.09 (H) 12/06/2022 1140   CREATININE 1.13 (H) 12/24/2018 1402   GLU 97 09/08/2021 0000      Component Value Date/Time   CALCIUM 9.1 12/06/2022 1140   ALKPHOS 76 12/06/2022 1140   AST 34 12/06/2022 1140   ALT 59 (H) 12/06/2022 1140   BILITOT 0.4 12/06/2022 1140       RADIOGRAPHIC STUDIES: CT CHEST ABDOMEN PELVIS W CONTRAST  Result Date: 12/06/2022 CLINICAL DATA:  Bronchogenic carcinoma. Lung metastasis. * Tracking Code: BO * EXAM: CT CHEST, ABDOMEN, AND PELVIS WITH CONTRAST TECHNIQUE: Multidetector CT imaging of the chest, abdomen and pelvis was performed following the standard protocol during bolus administration of intravenous contrast. RADIATION DOSE REDUCTION: This exam was performed according to the departmental dose-optimization program which includes automated exposure control, adjustment of the mA and/or kV according to patient size and/or use of iterative reconstruction technique. CONTRAST:  OMNIPAQUE IOHEXOL 300 MG/ML  SOLN COMPARISON:  FDG PET scan 08/21/2022 FINDINGS: CT CHEST FINDINGS Cardiovascular: Choose one Mediastinum/Nodes: Mediastinal hilar adenopathy similar to comparison PET-CT scan. LEFT hilar node measures 23 mm (image 22/2) subcarinal node measures 17 mm unchanged. Lungs/Pleura: Bilateral pulmonary nodules again noted. Elongated nodule/mass in the medial LEFT lower lobe measures 3. 8 x 2.2 cm compared to 3.9 x 2.2 cm. Example RIGHT lower lobe nodule measures 16 mm (image 63/4) compared to 16 mm. RIGHT upper lobe nodule measures 6 mm (image 23) compared to 7 mm. No new  nodules evident. Musculoskeletal: No aggressive osseous lesion. CT ABDOMEN AND PELVIS FINDINGS Hepatobiliary: Multifocal hepatic metastasis again noted. Difficult to compare between the metabolic lesions on comparison PET scan in the hypoenhancing lesions on current CT scan. Example lesion in the LEFT hepatic lobe measures 2.3 cm image 39/2. Example lesion in the RIGHT hepatic lobe measures 1.7 cm image 47. Central lesion measures 2.7 cm image 42/2 Pancreas: Pancreas is normal. No ductal dilatation. No pancreatic inflammation. Spleen: Normal spleen Adrenals/urinary tract: Mild thickening LEFT adrenal gland similar prior. RIGHT adrenal gland normal. Kidneys, ureters and bladder normal. Stomach/Bowel: Stomach, small bowel, appendix, and cecum are normal. The colon and  rectosigmoid colon are normal. Vascular/Lymphatic: Abdominal aorta is normal caliber. There is no retroperitoneal or periportal lymphadenopathy. No pelvic lymphadenopathy. Reproductive: Uterus and adnexa unremarkable. Other: No free fluid. Musculoskeletal: No aggressive osseous lesion. IMPRESSION: CHEST: 1. Stable mediastinal and hilar adenopathy. 2. Stable bilateral pulmonary metastasis. PELVIS: 1. Stable multifocal hepatic metastasis. 2. Stable mild thickening of the LEFT adrenal gland. Favored benign. Electronically Signed   By: Genevive Bi M.D.   On: 12/06/2022 16:08    ASSESSMENT AND PLAN: This is a very pleasant 57 years old African-American female with  Stage IV non-small cell lung cancer, adenocarcinoma (T2a, N3, M1c).  She presented with bilateral lung nodules, thoracic adenopathy and bilobar liver lesions.  She was diagnosed in May 2024.   She has no actionable mutations and PD-L1 expression was negative. The patient is currently undergoing systemic chemoimmunotherapy with carboplatin for AUC 5, Alimta 500 Mg/M2 and Keytruda 200 Mg IV every 3 weeks status post 3 cycles.   The patient has been tolerating this treatment well with no  concerning adverse effects. She had repeat CT scan of the chest, abdomen and pelvis performed recently.  I personally and independently reviewed the scan and discussed the result with the patient and her son. Her scan showed no concerning findings for disease progression. I recommended for her to continue her current treatment with the same regimen and she will proceed with cycle #4 today. Starting from cycle #5 she will be on maintenance treatment with Alimta and Keytruda every 3 weeks. The patient was advised to call immediately if she has any other concerning symptoms in the interval. The patient voices understanding of current disease status and treatment options and is in agreement with the current care plan.  All questions were answered. The patient knows to call the clinic with any problems, questions or concerns. We can certainly see the patient much sooner if necessary.  The total time spent in the appointment was 30 minutes.  Disclaimer: This note was dictated with voice recognition software. Similar sounding words can inadvertently be transcribed and may not be corrected upon review.

## 2022-12-12 NOTE — Patient Instructions (Signed)
[facility]  Discharge Instructions: Thank you for choosing [facility] to provide your oncology and hematology care.   If you have a lab appointment with the Cancer Center, please go directly to the Cancer Center and check in at the registration area.   Wear comfortable clothing and clothing appropriate for easy access to any Portacath or PICC line.   We strive to give you quality time with your provider. You may need to reschedule your appointment if you arrive late (15 or more minutes).  Arriving late affects you and other patients whose appointments are after yours.  Also, if you miss three or more appointments without notifying the office, you may be dismissed from the clinic at the provider's discretion.      For prescription refill requests, have your pharmacy contact our office and allow 72 hours for refills to be completed.    Today you received the following chemotherapy and/or immunotherapy agents Keytruda, Alimta, Carboplatin.      To help prevent nausea and vomiting after your treatment, we encourage you to take your nausea medication as directed.  BELOW ARE SYMPTOMS THAT SHOULD BE REPORTED IMMEDIATELY: *FEVER GREATER THAN 100.4 F (38 C) OR HIGHER *CHILLS OR SWEATING *NAUSEA AND VOMITING THAT IS NOT CONTROLLED WITH YOUR NAUSEA MEDICATION *UNUSUAL SHORTNESS OF BREATH *UNUSUAL BRUISING OR BLEEDING *URINARY PROBLEMS (pain or burning when urinating, or frequent urination) *BOWEL PROBLEMS (unusual diarrhea, constipation, pain near the anus) TENDERNESS IN MOUTH AND THROAT WITH OR WITHOUT PRESENCE OF ULCERS (sore throat, sores in mouth, or a toothache) UNUSUAL RASH, SWELLING OR PAIN  UNUSUAL VAGINAL DISCHARGE OR ITCHING   Items with * indicate a potential emergency and should be followed up as soon as possible or go to the Emergency Department if any problems should occur.  Please show the CHEMOTHERAPY ALERT CARD or IMMUNOTHERAPY  ALERT CARD at check-in to the Emergency Department and triage nurse.  Should you have questions after your visit or need to cancel or reschedule your appointment, please contact [facility]  Dept: [phone]  and follow the prompts.  Office hours are 8:00 a.m. to 4:30 p.m. Monday - Friday. Please note that voicemails left after 4:00 p.m. may not be returned until the following business day.  We are closed weekends and major holidays. You have access to a nurse at all times for urgent questions. Please call the main number to the clinic Dept: [phone] and follow the prompts.   For any non-urgent questions, you may also contact your provider using MyChart. We now offer e-Visits for anyone 18 and older to request care online for non-urgent symptoms. For details visit [email or web address].   Also download the MyChart app! Go to the app store, search "MyChart", open the app, select , and log in with your MyChart username and password.   

## 2022-12-14 ENCOUNTER — Encounter (INDEPENDENT_AMBULATORY_CARE_PROVIDER_SITE_OTHER): Payer: Self-pay

## 2022-12-19 ENCOUNTER — Telehealth: Payer: Self-pay

## 2022-12-19 ENCOUNTER — Inpatient Hospital Stay: Payer: BC Managed Care – PPO

## 2022-12-19 ENCOUNTER — Other Ambulatory Visit: Payer: Self-pay | Admitting: Internal Medicine

## 2022-12-19 VITALS — BP 127/63 | HR 99 | Temp 99.0°F | Resp 18

## 2022-12-19 DIAGNOSIS — Z5112 Encounter for antineoplastic immunotherapy: Secondary | ICD-10-CM | POA: Diagnosis not present

## 2022-12-19 DIAGNOSIS — D702 Other drug-induced agranulocytosis: Secondary | ICD-10-CM

## 2022-12-19 DIAGNOSIS — C3492 Malignant neoplasm of unspecified part of left bronchus or lung: Secondary | ICD-10-CM

## 2022-12-19 LAB — CMP (CANCER CENTER ONLY)
ALT: 32 U/L (ref 0–44)
AST: 32 U/L (ref 15–41)
Albumin: 3.9 g/dL (ref 3.5–5.0)
Alkaline Phosphatase: 70 U/L (ref 38–126)
Anion gap: 6 (ref 5–15)
BUN: 13 mg/dL (ref 6–20)
CO2: 31 mmol/L (ref 22–32)
Calcium: 9.5 mg/dL (ref 8.9–10.3)
Chloride: 103 mmol/L (ref 98–111)
Creatinine: 1 mg/dL (ref 0.44–1.00)
GFR, Estimated: >60 mL/min (ref >60)
Glucose, Bld: 122 mg/dL — ABNORMAL HIGH (ref 70–99)
Potassium: 4.1 mmol/L (ref 3.5–5.1)
Sodium: 140 mmol/L (ref 135–145)
Total Bilirubin: 0.7 mg/dL (ref 0.3–1.2)
Total Protein: 7 g/dL (ref 6.5–8.1)

## 2022-12-19 LAB — CBC WITH DIFFERENTIAL (CANCER CENTER ONLY)
Abs Immature Granulocytes: 0.01 10*3/uL (ref 0.00–0.07)
Basophils Absolute: 0 10*3/uL (ref 0.0–0.1)
Basophils Relative: 1 %
Eosinophils Absolute: 0 10*3/uL (ref 0.0–0.5)
Eosinophils Relative: 1 %
HCT: 31.1 % — ABNORMAL LOW (ref 36.0–46.0)
Hemoglobin: 10.5 g/dL — ABNORMAL LOW (ref 12.0–15.0)
Immature Granulocytes: 1 %
Lymphocytes Relative: 63 %
Lymphs Abs: 1 10*3/uL (ref 0.7–4.0)
MCH: 29.9 pg (ref 26.0–34.0)
MCHC: 33.8 g/dL (ref 30.0–36.0)
MCV: 88.6 fL (ref 80.0–100.0)
Monocytes Absolute: 0.1 10*3/uL (ref 0.1–1.0)
Monocytes Relative: 6 %
Neutro Abs: 0.4 10*3/uL — CL (ref 1.7–7.7)
Neutrophils Relative %: 28 %
Platelet Count: 190 10*3/uL (ref 150–400)
RBC: 3.51 MIL/uL — ABNORMAL LOW (ref 3.87–5.11)
RDW: 17.1 % — ABNORMAL HIGH (ref 11.5–15.5)
WBC Count: 1.5 10*3/uL — ABNORMAL LOW (ref 4.0–10.5)
nRBC: 0 % (ref 0.0–0.2)

## 2022-12-19 MED ORDER — FILGRASTIM-SNDZ 480 MCG/0.8ML IJ SOSY
480.0000 ug | PREFILLED_SYRINGE | Freq: Once | INTRAMUSCULAR | Status: AC
  Administered 2022-12-19: 480 ug via SUBCUTANEOUS
  Filled 2022-12-19: qty 0.8

## 2022-12-19 NOTE — Patient Instructions (Signed)
Filgrastim Injection What is this medication? FILGRASTIM (fil GRA stim) lowers the risk of infection in people who are receiving chemotherapy. It works by helping your body make more white blood cells, which protects your body from infection. It may also be used to help people who have been exposed to high doses of radiation. It can be used to help prepare your body before a stem cell transplant. It works by helping your bone marrow make and release stem cells into the blood. This medicine may be used for other purposes; ask your health care provider or pharmacist if you have questions. COMMON BRAND NAME(S): Neupogen, Nivestym, Releuko, Zarxio What should I tell my care team before I take this medication? They need to know if you have any of these conditions: History of blood diseases, such as sickle cell anemia Kidney disease Recent or ongoing radiation An unusual or allergic reaction to filgrastim, pegfilgrastim, latex, rubber, other medications, foods, dyes, or preservatives Pregnant or trying to get pregnant Breast-feeding How should I use this medication? This medication is injected under the skin or into a vein. It is usually given by your care team in a hospital or clinic setting. It may be given at home. If you get this medication at home, you will be taught how to prepare and give it. Use exactly as directed. Take it as directed on the prescription label at the same time every day. Keep taking it unless your care team tells you to stop. It is important that you put your used needles and syringes in a special sharps container. Do not put them in a trash can. If you do not have a sharps container, call your pharmacist or care team to get one. This medication comes with INSTRUCTIONS FOR USE. Ask your pharmacist for directions on how to use this medication. Read the information carefully. Talk to your pharmacist or care team if you have questions. Talk to your care team about the use of this  medication in children. While it may be prescribed for children for selected conditions, precautions do apply. Overdosage: If you think you have taken too much of this medicine contact a poison control center or emergency room at once. NOTE: This medicine is only for you. Do not share this medicine with others. What if I miss a dose? It is important not to miss any doses. Talk to your care team about what to do if you miss a dose. What may interact with this medication? Medications that may cause a release of neutrophils, such as lithium This list may not describe all possible interactions. Give your health care provider a list of all the medicines, herbs, non-prescription drugs, or dietary supplements you use. Also tell them if you smoke, drink alcohol, or use illegal drugs. Some items may interact with your medicine. What should I watch for while using this medication? Your condition will be monitored carefully while you are receiving this medication. You may need bloodwork while taking this medication. Talk to your care team about your risk of cancer. You may be more at risk for certain types of cancer if you take this medication. What side effects may I notice from receiving this medication? Side effects that you should report to your care team as soon as possible: Allergic reactions--skin rash, itching, hives, swelling of the face, lips, tongue, or throat Capillary leak syndrome--stomach or muscle pain, unusual weakness or fatigue, feeling faint or lightheaded, decrease in the amount of urine, swelling of the ankles, hands, or   feet, trouble breathing High white blood cell level--fever, fatigue, trouble breathing, night sweats, change in vision, weight loss Inflammation of the aorta--fever, fatigue, back, chest, or stomach pain, severe headache Kidney injury (glomerulonephritis)--decrease in the amount of urine, red or dark brown urine, foamy or bubbly urine, swelling of the ankles, hands, or  feet Shortness of breath or trouble breathing Spleen injury--pain in upper left stomach or shoulder Unusual bruising or bleeding Side effects that usually do not require medical attention (report to your care team if they continue or are bothersome): Back pain Bone pain Fatigue Fever Headache Nausea This list may not describe all possible side effects. Call your doctor for medical advice about side effects. You may report side effects to FDA at 1-800-FDA-1088. Where should I keep my medication? Keep out of the reach of children and pets. Keep this medication in the original packaging until you are ready to take it. Protect from light. See product for storage information. Each product may have different instructions. Get rid of any unused medication after the expiration date. To get rid of medications that are no longer needed or have expired: Take the medication to a medications take-back program. Check with your pharmacy or law enforcement to find a location. If you cannot return the medication, ask your pharmacist or care team how to get rid of this medication safely. NOTE: This sheet is a summary. It may not cover all possible information. If you have questions about this medicine, talk to your doctor, pharmacist, or health care provider.  2024 Elsevier/Gold Standard (2021-09-08 00:00:00)  

## 2022-12-19 NOTE — Telephone Encounter (Signed)
 CRITICAL VALUE STICKER  CRITICAL VALUE: ANC 0.4  RECEIVER (on-site recipient of call): A. , RN  DATE & TIME NOTIFIED: 12/19/22 at [address] (representative from lab): Unsure of name  MD NOTIFIED: Dr. Arbutus Ped  TIME OF NOTIFICATION: 1355   RESPONSE: Dr. Arbutus Ped states he will put in new orders for pt.

## 2022-12-20 ENCOUNTER — Other Ambulatory Visit: Payer: Self-pay

## 2022-12-20 ENCOUNTER — Inpatient Hospital Stay: Payer: BC Managed Care – PPO

## 2022-12-20 ENCOUNTER — Other Ambulatory Visit: Payer: Self-pay | Admitting: Nurse Practitioner

## 2022-12-20 VITALS — BP 113/76 | HR 81 | Temp 98.4°F | Resp 18

## 2022-12-20 DIAGNOSIS — D702 Other drug-induced agranulocytosis: Secondary | ICD-10-CM

## 2022-12-20 DIAGNOSIS — J309 Allergic rhinitis, unspecified: Secondary | ICD-10-CM

## 2022-12-20 DIAGNOSIS — Z5112 Encounter for antineoplastic immunotherapy: Secondary | ICD-10-CM | POA: Diagnosis not present

## 2022-12-20 MED ORDER — FILGRASTIM-SNDZ 480 MCG/0.8ML IJ SOSY
480.0000 ug | PREFILLED_SYRINGE | Freq: Every day | INTRAMUSCULAR | Status: DC
  Administered 2022-12-20: 480 ug via SUBCUTANEOUS
  Filled 2022-12-20: qty 0.8

## 2022-12-21 ENCOUNTER — Inpatient Hospital Stay: Payer: BC Managed Care – PPO

## 2022-12-21 VITALS — BP 108/58 | HR 78 | Temp 98.6°F | Resp 18

## 2022-12-21 DIAGNOSIS — D702 Other drug-induced agranulocytosis: Secondary | ICD-10-CM

## 2022-12-21 DIAGNOSIS — Z5112 Encounter for antineoplastic immunotherapy: Secondary | ICD-10-CM | POA: Diagnosis not present

## 2022-12-21 MED ORDER — FILGRASTIM-SNDZ 480 MCG/0.8ML IJ SOSY
480.0000 ug | PREFILLED_SYRINGE | Freq: Every day | INTRAMUSCULAR | Status: DC
  Administered 2022-12-21: 480 ug via SUBCUTANEOUS
  Filled 2022-12-21: qty 0.8

## 2022-12-25 ENCOUNTER — Other Ambulatory Visit: Payer: Self-pay | Admitting: Cardiology

## 2022-12-25 DIAGNOSIS — I152 Hypertension secondary to endocrine disorders: Secondary | ICD-10-CM

## 2022-12-26 ENCOUNTER — Encounter: Payer: Self-pay | Admitting: Internal Medicine

## 2022-12-26 ENCOUNTER — Inpatient Hospital Stay: Payer: BC Managed Care – PPO

## 2022-12-26 DIAGNOSIS — C3492 Malignant neoplasm of unspecified part of left bronchus or lung: Secondary | ICD-10-CM

## 2022-12-26 DIAGNOSIS — Z5112 Encounter for antineoplastic immunotherapy: Secondary | ICD-10-CM | POA: Diagnosis not present

## 2022-12-26 LAB — CBC WITH DIFFERENTIAL (CANCER CENTER ONLY)
Abs Immature Granulocytes: 0.47 10*3/uL — ABNORMAL HIGH (ref 0.00–0.07)
Basophils Absolute: 0 10*3/uL (ref 0.0–0.1)
Basophils Relative: 0 %
Eosinophils Absolute: 0 10*3/uL (ref 0.0–0.5)
Eosinophils Relative: 0 %
HCT: 31.5 % — ABNORMAL LOW (ref 36.0–46.0)
Hemoglobin: 10.3 g/dL — ABNORMAL LOW (ref 12.0–15.0)
Immature Granulocytes: 10 %
Lymphocytes Relative: 25 %
Lymphs Abs: 1.2 10*3/uL (ref 0.7–4.0)
MCH: 29.3 pg (ref 26.0–34.0)
MCHC: 32.7 g/dL (ref 30.0–36.0)
MCV: 89.7 fL (ref 80.0–100.0)
Monocytes Absolute: 0.5 10*3/uL (ref 0.1–1.0)
Monocytes Relative: 10 %
Neutro Abs: 2.5 10*3/uL (ref 1.7–7.7)
Neutrophils Relative %: 55 %
Platelet Count: 110 10*3/uL — ABNORMAL LOW (ref 150–400)
RBC: 3.51 MIL/uL — ABNORMAL LOW (ref 3.87–5.11)
RDW: 18.3 % — ABNORMAL HIGH (ref 11.5–15.5)
WBC Count: 4.6 10*3/uL (ref 4.0–10.5)
nRBC: 0.4 % — ABNORMAL HIGH (ref 0.0–0.2)

## 2022-12-26 LAB — CMP (CANCER CENTER ONLY)
ALT: 37 U/L (ref 0–44)
AST: 31 U/L (ref 15–41)
Albumin: 3.9 g/dL (ref 3.5–5.0)
Alkaline Phosphatase: 78 U/L (ref 38–126)
Anion gap: 7 (ref 5–15)
BUN: 8 mg/dL (ref 6–20)
CO2: 31 mmol/L (ref 22–32)
Calcium: 9.2 mg/dL (ref 8.9–10.3)
Chloride: 105 mmol/L (ref 98–111)
Creatinine: 1.15 mg/dL — ABNORMAL HIGH (ref 0.44–1.00)
GFR, Estimated: 56 mL/min — ABNORMAL LOW (ref >60)
Glucose, Bld: 99 mg/dL (ref 70–99)
Potassium: 4.1 mmol/L (ref 3.5–5.1)
Sodium: 143 mmol/L (ref 135–145)
Total Bilirubin: 0.4 mg/dL (ref 0.3–1.2)
Total Protein: 6.7 g/dL (ref 6.5–8.1)

## 2022-12-27 ENCOUNTER — Other Ambulatory Visit: Payer: Self-pay | Admitting: Physician Assistant

## 2022-12-29 NOTE — Progress Notes (Signed)
Pt notified that she does not need to have weekly labs drawn with her new chemo regimen. Lab appts for 9/10 and 9/17 cancelled.

## 2023-01-02 ENCOUNTER — Ambulatory Visit: Payer: BC Managed Care – PPO | Admitting: Nurse Practitioner

## 2023-01-03 ENCOUNTER — Inpatient Hospital Stay: Payer: BC Managed Care – PPO

## 2023-01-03 ENCOUNTER — Inpatient Hospital Stay (HOSPITAL_BASED_OUTPATIENT_CLINIC_OR_DEPARTMENT_OTHER): Payer: BC Managed Care – PPO | Admitting: Internal Medicine

## 2023-01-03 ENCOUNTER — Inpatient Hospital Stay: Payer: BC Managed Care – PPO | Attending: Physician Assistant

## 2023-01-03 VITALS — BP 95/64 | HR 70 | Temp 98.2°F | Resp 17 | Ht 62.0 in | Wt 300.0 lb

## 2023-01-03 VITALS — BP 110/68 | HR 65 | Resp 19

## 2023-01-03 DIAGNOSIS — R059 Cough, unspecified: Secondary | ICD-10-CM | POA: Insufficient documentation

## 2023-01-03 DIAGNOSIS — Z79899 Other long term (current) drug therapy: Secondary | ICD-10-CM | POA: Insufficient documentation

## 2023-01-03 DIAGNOSIS — C3490 Malignant neoplasm of unspecified part of unspecified bronchus or lung: Secondary | ICD-10-CM | POA: Insufficient documentation

## 2023-01-03 DIAGNOSIS — C3492 Malignant neoplasm of unspecified part of left bronchus or lung: Secondary | ICD-10-CM | POA: Diagnosis not present

## 2023-01-03 DIAGNOSIS — I10 Essential (primary) hypertension: Secondary | ICD-10-CM | POA: Diagnosis not present

## 2023-01-03 DIAGNOSIS — Z5112 Encounter for antineoplastic immunotherapy: Secondary | ICD-10-CM | POA: Insufficient documentation

## 2023-01-03 DIAGNOSIS — R2241 Localized swelling, mass and lump, right lower limb: Secondary | ICD-10-CM | POA: Diagnosis not present

## 2023-01-03 DIAGNOSIS — C787 Secondary malignant neoplasm of liver and intrahepatic bile duct: Secondary | ICD-10-CM | POA: Insufficient documentation

## 2023-01-03 DIAGNOSIS — Z5111 Encounter for antineoplastic chemotherapy: Secondary | ICD-10-CM | POA: Insufficient documentation

## 2023-01-03 DIAGNOSIS — R197 Diarrhea, unspecified: Secondary | ICD-10-CM | POA: Diagnosis not present

## 2023-01-03 DIAGNOSIS — E538 Deficiency of other specified B group vitamins: Secondary | ICD-10-CM | POA: Diagnosis not present

## 2023-01-03 LAB — CBC WITH DIFFERENTIAL (CANCER CENTER ONLY)
Abs Immature Granulocytes: 0.02 10*3/uL (ref 0.00–0.07)
Basophils Absolute: 0 10*3/uL (ref 0.0–0.1)
Basophils Relative: 1 %
Eosinophils Absolute: 0 10*3/uL (ref 0.0–0.5)
Eosinophils Relative: 1 %
HCT: 30 % — ABNORMAL LOW (ref 36.0–46.0)
Hemoglobin: 10 g/dL — ABNORMAL LOW (ref 12.0–15.0)
Immature Granulocytes: 1 %
Lymphocytes Relative: 33 %
Lymphs Abs: 1.1 10*3/uL (ref 0.7–4.0)
MCH: 30.4 pg (ref 26.0–34.0)
MCHC: 33.3 g/dL (ref 30.0–36.0)
MCV: 91.2 fL (ref 80.0–100.0)
Monocytes Absolute: 0.3 10*3/uL (ref 0.1–1.0)
Monocytes Relative: 10 %
Neutro Abs: 1.9 10*3/uL (ref 1.7–7.7)
Neutrophils Relative %: 54 %
Platelet Count: 240 10*3/uL (ref 150–400)
RBC: 3.29 MIL/uL — ABNORMAL LOW (ref 3.87–5.11)
RDW: 19.6 % — ABNORMAL HIGH (ref 11.5–15.5)
WBC Count: 3.5 10*3/uL — ABNORMAL LOW (ref 4.0–10.5)
nRBC: 0 % (ref 0.0–0.2)

## 2023-01-03 LAB — CMP (CANCER CENTER ONLY)
ALT: 15 U/L (ref 0–44)
AST: 19 U/L (ref 15–41)
Albumin: 3.9 g/dL (ref 3.5–5.0)
Alkaline Phosphatase: 61 U/L (ref 38–126)
Anion gap: 8 (ref 5–15)
BUN: 14 mg/dL (ref 6–20)
CO2: 29 mmol/L (ref 22–32)
Calcium: 9.4 mg/dL (ref 8.9–10.3)
Chloride: 104 mmol/L (ref 98–111)
Creatinine: 1.11 mg/dL — ABNORMAL HIGH (ref 0.44–1.00)
GFR, Estimated: 58 mL/min — ABNORMAL LOW (ref >60)
Glucose, Bld: 120 mg/dL — ABNORMAL HIGH (ref 70–99)
Potassium: 3.7 mmol/L (ref 3.5–5.1)
Sodium: 141 mmol/L (ref 135–145)
Total Bilirubin: 0.6 mg/dL (ref 0.3–1.2)
Total Protein: 6.7 g/dL (ref 6.5–8.1)

## 2023-01-03 MED ORDER — PROCHLORPERAZINE MALEATE 10 MG PO TABS
10.0000 mg | ORAL_TABLET | Freq: Once | ORAL | Status: AC
  Administered 2023-01-03: 10 mg via ORAL
  Filled 2023-01-03: qty 1

## 2023-01-03 MED ORDER — SODIUM CHLORIDE 0.9 % IV SOLN
200.0000 mg | Freq: Once | INTRAVENOUS | Status: AC
  Administered 2023-01-03: 200 mg via INTRAVENOUS
  Filled 2023-01-03: qty 200

## 2023-01-03 MED ORDER — CYANOCOBALAMIN 1000 MCG/ML IJ SOLN
1000.0000 ug | Freq: Once | INTRAMUSCULAR | Status: AC
  Administered 2023-01-03: 1000 ug via INTRAMUSCULAR
  Filled 2023-01-03: qty 1

## 2023-01-03 MED ORDER — SODIUM CHLORIDE 0.9 % IV SOLN: Freq: Once | INTRAVENOUS | Status: AC

## 2023-01-03 MED ORDER — SODIUM CHLORIDE 0.9 % IV SOLN
500.0000 mg/m2 | Freq: Once | INTRAVENOUS | Status: AC
  Administered 2023-01-03: 1200 mg via INTRAVENOUS
  Filled 2023-01-03: qty 40

## 2023-01-03 NOTE — Progress Notes (Signed)
[facility] [phone]   [phone]  OFFICE PROGRESS NOTE  [name], [name], NP [address] [place] [address]   DIAGNOSIS: Stage IV non-small cell lung cancer, adenocarcinoma (T2a, N3, M1c).  She presented with bilateral lung nodules, thoracic adenopathy and bilobar liver lesions.  She was diagnosed in May 2024.     PDL1: 0   Guardant 360: Negative for any actionable mutations   Foundation One: No actionable mutations    PRIOR THERAPY: None   CURRENT THERAPY: Palliative systemic chemotherapy and immunotherapy with carboplatin for an AUC of 5, alimta 500 mg/m2, and Keytruda 200 mg IV every 3 weeks. First dose on 10/10/22. Status post 4 cycles.  Starting from cycle #5 the patient is on maintenance treatment with Alimta and Keytruda every 3 weeks  INTERVAL HISTORY: Madison Palmer 57 y.o. female returns to the clinic today for follow-up visit accompanied by her son.  The patient is feeling fine today with no concerning complaints.  She tolerated the last cycle of her treatment fairly well with no concerning adverse effect except for the mild fatigue.  She required growth factor injection because of chemotherapy-induced neutropenia.  She denied having any current chest pain, shortness of breath, cough or hemoptysis.  She has no nausea, vomiting, diarrhea or constipation.  She is here today for evaluation before starting cycle #5 of her treatment.  MEDICAL HISTORY: Past Medical History:  Diagnosis Date   Acute on chronic respiratory failure with hypoxia and hypercapnia (HCC) 09/15/2018   Anemia    Anxiety    Arrhythmia    tachycardia   Arthritis    Bipolar 1 disorder (HCC) 09/12/2018   Bipolar I disorder, current or most recent episode manic, with psychotic features (HCC)    Brief psychotic disorder (HCC) 08/22/2018   Chronic respiratory failure with hypoxia (HCC) 05/10/2018   Formatting of this note might be different from  the original. Last Assessment & Plan:  Continue 1 L continuous on exertion and during sleep. On her next visit we will check OSA on CPAP/room air to decide whether she needs to take oxygen along with her on her cruise in May   Depression    Diverticulitis    Generalized edema 03/12/2018   GERD (gastroesophageal reflux disease)    Glaucoma    Hyperlipidemia    Hyperparathyroidism (HCC)    Hypertension    Inflammatory polyps of colon (HCC)    Leg swelling 01/14/2019   LVH (left ventricular hypertrophy)    Lymphedema    Morbid (severe) obesity due to excess calories (HCC) 05/11/2017   PCOS (polycystic ovarian syndrome)    Prediabetes    Recurrent UTI    Sleep apnea    CPAP   Thyroid disease    Vitamin D deficiency     ALLERGIES:  is allergic to other, fentanyl, levofloxacin, midazolam, pollen extract, atorvastatin, hydralazine hcl, and rosuvastatin.  MEDICATIONS:  Current Outpatient Medications  Medication Sig Dispense Refill   acetaminophen (TYLENOL) 500 MG tablet Take 1,000 mg by mouth 3 (three) times daily as needed for moderate pain or headache.     amLODipine (NORVASC) 10 MG tablet TAKE 1 TABLET BY MOUTH EVERY DAY 90 tablet 3   atorvastatin (LIPITOR) 20 MG tablet TAKE 1 TABLET(20 MG) BY MOUTH 3 TIMES A WEEK 12 tablet 5   benztropine (COGENTIN) 0.5 MG tablet Take 1 tablet (0.5 mg total) by mouth at bedtime. 30 tablet 2   cetirizine (  ZYRTEC) 10 MG tablet TAKE 1 TABLET BY MOUTH EVERY DAY 90 tablet 1   Cholecalciferol (VITAMIN D3 MAXIMUM STRENGTH) 125 MCG (5000 UT) capsule Take 5,000 Units by mouth daily.     cinacalcet (SENSIPAR) 30 MG tablet Take 30 mg by mouth daily.     cloNIDine (CATAPRES) 0.1 MG tablet TAKE 1 TABLET(0.1 MG) BY MOUTH THREE TIMES DAILY 90 tablet 3   esomeprazole (NEXIUM) 40 MG capsule TAKE 1 CAPSULE BY MOUTH TWICE DAILY 180 capsule 2   fenofibrate (TRICOR) 145 MG tablet TAKE 1 TABLET(145 MG) BY MOUTH DAILY 90 tablet 3   folic acid (FOLVITE) 1 MG tablet TAKE 1  TABLET BY MOUTH DAILY 30 tablet 2   furosemide (LASIX) 40 MG tablet TAKE 1 TABLET BY MOUTH EVERY DAY 90 tablet 3   gabapentin (NEURONTIN) 100 MG capsule TAKE 2 CAPSULES(200 MG) BY MOUTH AT BEDTIME 180 capsule 3   labetalol (NORMODYNE) 300 MG tablet TAKE 1.5 TABLETS BY MOUTH TWICE DAILY 270 tablet 3   levothyroxine (SYNTHROID) 88 MCG tablet Take 88 mcg by mouth daily.     lisinopril (ZESTRIL) 40 MG tablet TAKE 1 TABLET BY MOUTH EVERY DAY 90 tablet 1   lurasidone (LATUDA) 40 MG TABS tablet Take 1 tablet (40 mg total) by mouth daily with breakfast. 30 tablet 2   oxyCODONE (OXY IR/ROXICODONE) 5 MG immediate release tablet 1 tablet 30 minutes before the MRI.  May repeat once if needed. 10 tablet 0   Polyethylene Glycol 3350 (MIRALAX PO) Take 1 Capful by mouth daily as needed (Constipation).     Potassium Chloride ER 20 MEQ TBCR TAKE 1 TABLET BY MOUTH EVERY DAY 90 tablet 2   prochlorperazine (COMPAZINE) 10 MG tablet Take 1 tablet (10 mg total) by mouth every 6 (six) hours as needed. 30 tablet 2   sucralfate (CARAFATE) 1 g tablet Take 1 tablet (1 g total) by mouth 3 (three) times daily before meals. 90 tablet 3   TART CHERRY PO Take 1 tablet by mouth daily.     No current facility-administered medications for this visit.    SURGICAL HISTORY:  Past Surgical History:  Procedure Laterality Date   BIOPSY  09/27/2022   Procedure: BIOPSY;  Surgeon: Mansouraty, Netty Starring., MD;  Location: [facility] ENDOSCOPY;  Service: Gastroenterology;;   BREAST BIOPSY  2015   CESAREAN SECTION  2004   CHOLECYSTECTOMY     COLONOSCOPY WITH PROPOFOL N/A 09/27/2022   Procedure: COLONOSCOPY WITH PROPOFOL;  Surgeon: Lemar Lofty., MD;  Location: [facility] ENDOSCOPY;  Service: Gastroenterology;  Laterality: N/A;   ESOPHAGOGASTRODUODENOSCOPY (EGD) WITH PROPOFOL N/A 09/27/2022   Procedure: ESOPHAGOGASTRODUODENOSCOPY (EGD) WITH PROPOFOL;  Surgeon: Meridee Score Netty Starring., MD;  Location: [facility] ENDOSCOPY;  Service: Gastroenterology;   Laterality: N/A;   INNER EAR SURGERY     ear and sinus surgery   LAPAROSCOPIC REPAIR AND REMOVAL OF GASTRIC BAND     MALONEY DILATION  09/27/2022   Procedure: MALONEY DILATION;  Surgeon: Lemar Lofty., MD;  Location: [facility] ENDOSCOPY;  Service: Gastroenterology;;   OOPHORECTOMY Left    POLYPECTOMY  09/27/2022   Procedure: POLYPECTOMY;  Surgeon: Lemar Lofty., MD;  Location: [facility] ENDOSCOPY;  Service: Gastroenterology;;   TONSILLECTOMY AND ADENOIDECTOMY      REVIEW OF SYSTEMS:  A comprehensive review of systems was negative except for: Constitutional: positive for fatigue   PHYSICAL EXAMINATION: General appearance: alert, cooperative, fatigued, and no distress Head: Normocephalic, without obvious abnormality, atraumatic Neck: no adenopathy, no JVD, supple, symmetrical, trachea midline,  and thyroid not enlarged, symmetric, no tenderness/mass/nodules Lymph nodes: Cervical, supraclavicular, and axillary nodes normal. Resp: clear to auscultation bilaterally Back: symmetric, no curvature. ROM normal. No CVA tenderness. Cardio: regular rate and rhythm, S1, S2 normal, no murmur, click, rub or gallop GI: soft, non-tender; bowel sounds normal; no masses,  no organomegaly Extremities: extremities normal, atraumatic, no cyanosis or edema  ECOG PERFORMANCE STATUS: 1 - Symptomatic but completely ambulatory  Blood pressure 95/64, pulse 70, temperature 98.2 F (36.8 C), temperature source Oral, resp. rate 17, height 5\' 2"  (1.575 m), weight 300 lb (136.1 kg), last menstrual period 05/02/2017, SpO2 97%.  LABORATORY DATA: Lab Results  Component Value Date   WBC 3.5 (L) 01/03/2023   HGB 10.0 (L) 01/03/2023   HCT 30.0 (L) 01/03/2023   MCV 91.2 01/03/2023   PLT 240 01/03/2023      Chemistry      Component Value Date/Time   NA 143 12/26/2022 1337   NA 145 09/08/2021 0000   K 4.1 12/26/2022 1337   CL 105 12/26/2022 1337   CO2 31 12/26/2022 1337   BUN 8 12/26/2022 1337   BUN 36 (H)  08/22/2021 1606   CREATININE 1.15 (H) 12/26/2022 1337   CREATININE 1.13 (H) 12/24/2018 1402   GLU 97 09/08/2021 0000      Component Value Date/Time   CALCIUM 9.2 12/26/2022 1337   ALKPHOS 78 12/26/2022 1337   AST 31 12/26/2022 1337   ALT 37 12/26/2022 1337   BILITOT 0.4 12/26/2022 1337       RADIOGRAPHIC STUDIES: CT CHEST ABDOMEN PELVIS W CONTRAST  Result Date: 12/06/2022 CLINICAL DATA:  Bronchogenic carcinoma. Lung metastasis. * Tracking Code: BO * EXAM: CT CHEST, ABDOMEN, AND PELVIS WITH CONTRAST TECHNIQUE: Multidetector CT imaging of the chest, abdomen and pelvis was performed following the standard protocol during bolus administration of intravenous contrast. RADIATION DOSE REDUCTION: This exam was performed according to the departmental dose-optimization program which includes automated exposure control, adjustment of the mA and/or kV according to patient size and/or use of iterative reconstruction technique. CONTRAST:  OMNIPAQUE IOHEXOL 300 MG/ML  SOLN COMPARISON:  FDG PET scan 08/21/2022 FINDINGS: CT CHEST FINDINGS Cardiovascular: Choose one Mediastinum/Nodes: Mediastinal hilar adenopathy similar to comparison PET-CT scan. LEFT hilar node measures 23 mm (image 22/2) subcarinal node measures 17 mm unchanged. Lungs/Pleura: Bilateral pulmonary nodules again noted. Elongated nodule/mass in the medial LEFT lower lobe measures 3. 8 x 2.2 cm compared to 3.9 x 2.2 cm. Example RIGHT lower lobe nodule measures 16 mm (image 63/4) compared to 16 mm. RIGHT upper lobe nodule measures 6 mm (image 23) compared to 7 mm. No new nodules evident. Musculoskeletal: No aggressive osseous lesion. CT ABDOMEN AND PELVIS FINDINGS Hepatobiliary: Multifocal hepatic metastasis again noted. Difficult to compare between the metabolic lesions on comparison PET scan in the hypoenhancing lesions on current CT scan. Example lesion in the LEFT hepatic lobe measures 2.3 cm image 39/2. Example lesion in the RIGHT hepatic  lobe measures 1.7 cm image 47. Central lesion measures 2.7 cm image 42/2 Pancreas: Pancreas is normal. No ductal dilatation. No pancreatic inflammation. Spleen: Normal spleen Adrenals/urinary tract: Mild thickening LEFT adrenal gland similar prior. RIGHT adrenal gland normal. Kidneys, ureters and bladder normal. Stomach/Bowel: Stomach, small bowel, appendix, and cecum are normal. The colon and rectosigmoid colon are normal. Vascular/Lymphatic: Abdominal aorta is normal caliber. There is no retroperitoneal or periportal lymphadenopathy. No pelvic lymphadenopathy. Reproductive: Uterus and adnexa unremarkable. Other: No free fluid. Musculoskeletal: No aggressive osseous lesion. IMPRESSION: CHEST:  1. Stable mediastinal and hilar adenopathy. 2. Stable bilateral pulmonary metastasis. PELVIS: 1. Stable multifocal hepatic metastasis. 2. Stable mild thickening of the LEFT adrenal gland. Favored benign. Electronically Signed   By: Genevive Bi M.D.   On: 12/06/2022 16:08    ASSESSMENT AND PLAN: This is a very pleasant 57 years old African-American female with stage IV non-small cell lung cancer, adenocarcinoma (T2a, N3, M1c).  She presented with bilateral lung nodules, thoracic adenopathy and bilobar liver lesions.  She was diagnosed in May 2024.   She has no actionable mutations and PD-L1 expression was negative. The patient is currently undergoing systemic chemoimmunotherapy with carboplatin for AUC 5, Alimta 500 Mg/M2 and Keytruda 200 Mg IV every 3 weeks status post 4 cycles.  Starting from cycle #5 she is on maintenance treatment with Alimta and Keytruda every 3 weeks. The patient tolerated the last cycle of her treatment fairly well. I recommended for her to proceed with cycle #5 today as planned. I will see her back for follow-up visit in 3 weeks for evaluation before the next cycle of her treatment. The patient was advised to call immediately if she has any other concerning symptoms in the interval.  The  patient voices understanding of current disease status and treatment options and is in agreement with the current care plan.  All questions were answered. The patient knows to call the clinic with any problems, questions or concerns. We can certainly see the patient much sooner if necessary.  The total time spent in the appointment was 20 minutes.  Disclaimer: This note was dictated with voice recognition software. Similar sounding words can inadvertently be transcribed and may not be corrected upon review.

## 2023-01-03 NOTE — Patient Instructions (Signed)
[facility]  Discharge Instructions: Thank you for choosing [facility] to provide your oncology and hematology care.   If you have a lab appointment with the Cancer Center, please go directly to the Cancer Center and check in at the registration area.   Wear comfortable clothing and clothing appropriate for easy access to any Portacath or PICC line.   We strive to give you quality time with your provider. You may need to reschedule your appointment if you arrive late (15 or more minutes).  Arriving late affects you and other patients whose appointments are after yours.  Also, if you miss three or more appointments without notifying the office, you may be dismissed from the clinic at the provider's discretion.      For prescription refill requests, have your pharmacy contact our office and allow 72 hours for refills to be completed.    Today you received the following chemotherapy and/or immunotherapy agents: Keytruda and Alimta      To help prevent nausea and vomiting after your treatment, we encourage you to take your nausea medication as directed.  BELOW ARE SYMPTOMS THAT SHOULD BE REPORTED IMMEDIATELY: *FEVER GREATER THAN 100.4 F (38 C) OR HIGHER *CHILLS OR SWEATING *NAUSEA AND VOMITING THAT IS NOT CONTROLLED WITH YOUR NAUSEA MEDICATION *UNUSUAL SHORTNESS OF BREATH *UNUSUAL BRUISING OR BLEEDING *URINARY PROBLEMS (pain or burning when urinating, or frequent urination) *BOWEL PROBLEMS (unusual diarrhea, constipation, pain near the anus) TENDERNESS IN MOUTH AND THROAT WITH OR WITHOUT PRESENCE OF ULCERS (sore throat, sores in mouth, or a toothache) UNUSUAL RASH, SWELLING OR PAIN  UNUSUAL VAGINAL DISCHARGE OR ITCHING   Items with * indicate a potential emergency and should be followed up as soon as possible or go to the Emergency Department if any problems should occur.  Please show the CHEMOTHERAPY ALERT CARD or IMMUNOTHERAPY ALERT  CARD at check-in to the Emergency Department and triage nurse.  Should you have questions after your visit or need to cancel or reschedule your appointment, please contact [facility]  Dept: [phone]  and follow the prompts.  Office hours are 8:00 a.m. to 4:30 p.m. Monday - Friday. Please note that voicemails left after 4:00 p.m. may not be returned until the following business day.  We are closed weekends and major holidays. You have access to a nurse at all times for urgent questions. Please call the main number to the clinic Dept: [phone] and follow the prompts.   For any non-urgent questions, you may also contact your provider using MyChart. We now offer e-Visits for anyone 18 and older to request care online for non-urgent symptoms. For details visit [email or web address].   Also download the MyChart app! Go to the app store, search "MyChart", open the app, select [place], and log in with your MyChart username and password.   

## 2023-01-06 ENCOUNTER — Other Ambulatory Visit (HOSPITAL_COMMUNITY): Payer: Self-pay | Admitting: Psychiatry

## 2023-01-06 DIAGNOSIS — R251 Tremor, unspecified: Secondary | ICD-10-CM

## 2023-01-06 DIAGNOSIS — F319 Bipolar disorder, unspecified: Secondary | ICD-10-CM

## 2023-01-08 ENCOUNTER — Ambulatory Visit (HOSPITAL_BASED_OUTPATIENT_CLINIC_OR_DEPARTMENT_OTHER): Payer: BC Managed Care – PPO | Admitting: Pulmonary Disease

## 2023-01-09 ENCOUNTER — Encounter (HOSPITAL_COMMUNITY): Payer: Self-pay | Admitting: Psychiatry

## 2023-01-09 ENCOUNTER — Other Ambulatory Visit: Payer: BC Managed Care – PPO

## 2023-01-09 ENCOUNTER — Telehealth (HOSPITAL_COMMUNITY): Payer: BC Managed Care – PPO | Admitting: Psychiatry

## 2023-01-09 DIAGNOSIS — R251 Tremor, unspecified: Secondary | ICD-10-CM | POA: Diagnosis not present

## 2023-01-09 DIAGNOSIS — F319 Bipolar disorder, unspecified: Secondary | ICD-10-CM

## 2023-01-09 DIAGNOSIS — F419 Anxiety disorder, unspecified: Secondary | ICD-10-CM | POA: Diagnosis not present

## 2023-01-09 MED ORDER — BENZTROPINE MESYLATE 0.5 MG PO TABS
0.5000 mg | ORAL_TABLET | Freq: Every day | ORAL | 2 refills | Status: DC
Start: 2023-01-09 — End: 2023-04-10

## 2023-01-09 MED ORDER — LURASIDONE HCL 40 MG PO TABS
40.0000 mg | ORAL_TABLET | Freq: Every day | ORAL | 2 refills | Status: DC
Start: 2023-01-09 — End: 2023-04-10

## 2023-01-09 NOTE — Progress Notes (Signed)
[facility] MD Virtual Progress Note   Patient Location: In Car Provider Location: Home Office  I connect with patient by video and verified that I am speaking with correct person by using two identifiers. I discussed the limitations of evaluation and management by telemedicine and the availability of in person appointments. I also discussed with the patient that there may be a patient responsible charge related to this service. The patient expressed understanding and agreed to proceed.  Madison Palmer [id number] 57 y.o.  01/09/2023 10:05 AM  History of Present Illness:  Patient is evaluated by video session.  She is in the car.  She reported things are stable and going gradually and is smoothly.  Her friend came from [place] to help her while she was doing chemotherapy.  Patient lives with her son who is also very helpful.  Patient seeing cancer doctor for stage IV lung cancer and finished chemotherapy.  I reviewed blood work results.  WBC count is low and creatinine 1.1.  She is compliant with Latuda and Cogentin.  Her tremors are much better.  She sleeps okay with the help of CPAP.  She denies any irritability, anger, mania, psychosis or any hallucination.  She denies any feeling of hopelessness.  She is in good spirits and she feel she had a good support around her.  Her appetite is okay.  She lost few pounds since the last visit and her energy level is okay.  She denies any mania or any impulsive behavior.  She denies any aggression or violence.  She like to keep current medication.  Past Psychiatric History: H/O depression and inpatient at [facility] in May [address] for delusion, psychosis and violent behavior. D/C on Depakote 500 mg BID, Latuda 60 mg daily and Trilafon 12 mg a day. No h/o suicidal attempt, nightmares, panic attack, OCD or substance use.      Outpatient Encounter Medications as of 01/09/2023  Medication Sig   acetaminophen (TYLENOL) 500 MG  tablet Take 1,000 mg by mouth 3 (three) times daily as needed for moderate pain or headache.   amLODipine (NORVASC) 10 MG tablet TAKE 1 TABLET BY MOUTH EVERY DAY   atorvastatin (LIPITOR) 20 MG tablet TAKE 1 TABLET(20 MG) BY MOUTH 3 TIMES A WEEK   benztropine (COGENTIN) 0.5 MG tablet Take 1 tablet (0.5 mg total) by mouth at bedtime.   cetirizine (ZYRTEC) 10 MG tablet TAKE 1 TABLET BY MOUTH EVERY DAY   Cholecalciferol (VITAMIN D3 MAXIMUM STRENGTH) 125 MCG (5000 UT) capsule Take 5,000 Units by mouth daily.   cinacalcet (SENSIPAR) 30 MG tablet Take 30 mg by mouth daily.   cloNIDine (CATAPRES) 0.1 MG tablet TAKE 1 TABLET(0.1 MG) BY MOUTH THREE TIMES DAILY   esomeprazole (NEXIUM) 40 MG capsule TAKE 1 CAPSULE BY MOUTH TWICE DAILY   fenofibrate (TRICOR) 145 MG tablet TAKE 1 TABLET(145 MG) BY MOUTH DAILY   folic acid (FOLVITE) 1 MG tablet TAKE 1 TABLET BY MOUTH DAILY   furosemide (LASIX) 40 MG tablet TAKE 1 TABLET BY MOUTH EVERY DAY   gabapentin (NEURONTIN) 100 MG capsule TAKE 2 CAPSULES(200 MG) BY MOUTH AT BEDTIME   labetalol (NORMODYNE) 300 MG tablet TAKE 1.5 TABLETS BY MOUTH TWICE DAILY   levothyroxine (SYNTHROID) 88 MCG tablet Take 88 mcg by mouth daily.   lisinopril (ZESTRIL) 40 MG tablet TAKE 1 TABLET BY MOUTH EVERY DAY   lurasidone (LATUDA) 40 MG TABS tablet Take 1 tablet (40 mg total) by mouth daily with  breakfast.   oxyCODONE (OXY IR/ROXICODONE) 5 MG immediate release tablet 1 tablet 30 minutes before the MRI.  May repeat once if needed.   Polyethylene Glycol 3350 (MIRALAX PO) Take 1 Capful by mouth daily as needed (Constipation).   Potassium Chloride ER 20 MEQ TBCR TAKE 1 TABLET BY MOUTH EVERY DAY   prochlorperazine (COMPAZINE) 10 MG tablet Take 1 tablet (10 mg total) by mouth every 6 (six) hours as needed.   sucralfate (CARAFATE) 1 g tablet Take 1 tablet (1 g total) by mouth 3 (three) times daily before meals.   TART CHERRY PO Take 1 tablet by mouth daily.   No facility-administered  encounter medications on file as of 01/09/2023.    Recent Results (from the past 2160 hour(s))  T4     Status: None   Collection Time: 10/17/22  3:08 PM  Result Value Ref Range   T4, Total 10.9 4.5 - 12.0 ug/dL    Comment: (NOTE) Performed At: [facility] [address] [place], [place] [id number] Jolene Schimke MD NW:[id number]   TSH     Status: None   Collection Time: 10/17/22  3:08 PM  Result Value Ref Range   TSH 1.850 0.350 - 4.500 uIU/mL    Comment: Performed by a 3rd Generation assay with a functional sensitivity of <=0.01 uIU/mL. Performed at [facility], [address] [place], [place] [address]   CMP (Cancer Center only)     Status: Abnormal   Collection Time: 10/17/22  3:08 PM  Result Value Ref Range   Sodium 140 135 - 145 mmol/L   Potassium 3.6 3.5 - 5.1 mmol/L   Chloride 104 98 - 111 mmol/L   CO2 29 22 - 32 mmol/L   Glucose, Bld 191 (H) 70 - 99 mg/dL    Comment: Glucose reference range applies only to samples taken after fasting for at least 8 hours.   BUN 13 6 - 20 mg/dL   Creatinine 9.62 9.52 - 1.00 mg/dL   Calcium 9.7 8.9 - 84.1 mg/dL   Total Protein 6.3 (L) 6.5 - 8.1 g/dL   Albumin 3.6 3.5 - 5.0 g/dL   AST 31 15 - 41 U/L   ALT 33 0 - 44 U/L   Alkaline Phosphatase 73 38 - 126 U/L   Total Bilirubin 0.5 0.3 - 1.2 mg/dL   GFR, Estimated >32 >44 mL/min    Comment: (NOTE) Calculated using the CKD-EPI Creatinine Equation (2021)    Anion gap 7 5 - 15    Comment: Performed at [facility], [address], [place], [place] [address]  CBC with Differential (Cancer Center Only)     Status: Abnormal   Collection Time: 10/17/22  3:08 PM  Result Value Ref Range   WBC Count 2.5 (L) 4.0 - 10.5 K/uL   RBC 4.72 3.87 - 5.11 MIL/uL   Hemoglobin 12.9 12.0 - 15.0 g/dL   HCT 25.3 66.4 - 40.3 %   MCV 85.0 80.0 - 100.0 fL   MCH 27.3 26.0 - 34.0 pg   MCHC 32.2 30.0 - 36.0 g/dL   RDW 47.4 25.9 - 56.3 %   Platelet Count  257 150 - 400 K/uL   nRBC 0.0 0.0 - 0.2 %   Neutrophils Relative % 55 %   Neutro Abs 1.4 (L) 1.7 - 7.7 K/uL   Lymphocytes Relative 38 %   Lymphs Abs 1.0 0.7 - 4.0 K/uL   Monocytes Relative 3 %   Monocytes Absolute 0.1  0.1 - 1.0 K/uL   Eosinophils Relative 3 %   Eosinophils Absolute 0.1 0.0 - 0.5 K/uL   Basophils Relative 1 %   Basophils Absolute 0.0 0.0 - 0.1 K/uL   Immature Granulocytes 0 %   Abs Immature Granulocytes 0.00 0.00 - 0.07 K/uL    Comment: Performed at [facility], [address] [place], [place] [address]  Hemoglobin A1c     Status: None   Collection Time: 10/18/22  2:06 PM  Result Value Ref Range   Hgb A1c MFr Bld 6.3 4.6 - 6.5 %    Comment: Glycemic Control Guidelines for People with Diabetes:Non Diabetic:  <6%Goal of Therapy: <7%Additional Action Suggested:  >8%   Lipid panel     Status: None   Collection Time: 10/18/22  2:06 PM  Result Value Ref Range   Cholesterol 160 0 - 200 mg/dL    Comment: ATP III Classification       Desirable:  < 200 mg/dL               Borderline High:  200 - 239 mg/dL          High:  > = 191 mg/dL   Triglycerides 478.2 0.0 - 149.0 mg/dL    Comment: Normal:  <956 mg/dLBorderline High:  150 - 199 mg/dL   HDL 21.30 >86.57 mg/dL   VLDL 84.6 0.0 - 96.2 mg/dL   LDL Cholesterol 86 0 - 99 mg/dL   Total CHOL/HDL Ratio 3     Comment:                Men          Women1/2 Average Risk     3.4          3.3Average Risk          5.0          4.42X Average Risk          9.6          7.13X Average Risk          15.0          11.0                       NonHDL 112.72     Comment: NOTE:  Non-HDL goal should be 30 mg/dL higher than patient's LDL goal (i.e. LDL goal of < 70 mg/dL, would have non-HDL goal of < 100 mg/dL)  PTH, intact (no Ca)     Status: Abnormal   Collection Time: 10/18/22  2:06 PM  Result Value Ref Range   PTH 87 (H) 16 - 77 pg/mL    Comment: . Interpretive Guide    Intact PTH            Calcium ------------------    ----------           ------- Normal Parathyroid    Normal               Normal Hypoparathyroidism    Low or Low Normal    Low Hyperparathyroidism    Primary            Normal or High       High    Secondary          High                 Normal or Low    Tertiary  High                 High Non-Parathyroid    Hypercalcemia      Low or Low Normal    High .   EXTRA LAV TOP TUBE     Status: None   Collection Time: 10/18/22  2:06 PM  Result Value Ref Range   EXTRA LAVENDER-TOP TUBE      Comment: We received an extra specimen with no test requested. If any test is desired for this specimen please call client services and advise.   Microalbumin / creatinine urine ratio     Status: None   Collection Time: 10/18/22  2:13 PM  Result Value Ref Range   Microalb, Ur <0.7 0.0 - 1.9 mg/dL   Creatinine,U 16.1 mg/dL   Microalb Creat Ratio 2.0 0.0 - 30.0 mg/g  T4     Status: None   Collection Time: 10/24/22  1:34 PM  Result Value Ref Range   T4, Total 10.3 4.5 - 12.0 ug/dL    Comment: (NOTE) Performed At: [facility] [address] [place], [place] [id number] Jolene Schimke MD WJ:[id number]   TSH     Status: None   Collection Time: 10/24/22  1:34 PM  Result Value Ref Range   TSH 1.783 0.350 - 4.500 uIU/mL    Comment: Performed by a 3rd Generation assay with a functional sensitivity of <=0.01 uIU/mL. Performed at [facility], [address], [place], [place] [address]   CMP (Cancer Center only)     Status: Abnormal   Collection Time: 10/24/22  1:34 PM  Result Value Ref Range   Sodium 141 135 - 145 mmol/L   Potassium 3.8 3.5 - 5.1 mmol/L   Chloride 106 98 - 111 mmol/L   CO2 27 22 - 32 mmol/L   Glucose, Bld 139 (H) 70 - 99 mg/dL    Comment: Glucose reference range applies only to samples taken after fasting for at least 8 hours.   BUN 19 6 - 20 mg/dL   Creatinine 6.57 8.46 - 1.00 mg/dL   Calcium 9.7 8.9 - 96.2 mg/dL    Total Protein 6.8 6.5 - 8.1 g/dL   Albumin 3.6 3.5 - 5.0 g/dL   AST 29 15 - 41 U/L   ALT 47 (H) 0 - 44 U/L   Alkaline Phosphatase 79 38 - 126 U/L   Total Bilirubin 0.4 0.3 - 1.2 mg/dL   GFR, Estimated >95 >28 mL/min    Comment: (NOTE) Calculated using the CKD-EPI Creatinine Equation (2021)    Anion gap 8 5 - 15    Comment: Performed at [facility], [address] [place], [place] [address]  CBC with Differential (Cancer Center Only)     Status: Abnormal   Collection Time: 10/24/22  1:34 PM  Result Value Ref Range   WBC Count 2.6 (L) 4.0 - 10.5 K/uL   RBC 4.59 3.87 - 5.11 MIL/uL   Hemoglobin 12.7 12.0 - 15.0 g/dL   HCT 40.1 02.7 - 25.3 %   MCV 85.4 80.0 - 100.0 fL   MCH 27.7 26.0 - 34.0 pg   MCHC 32.4 30.0 - 36.0 g/dL   RDW 66.4 40.3 - 47.4 %   Platelet Count 202 150 - 400 K/uL   nRBC 0.0 0.0 - 0.2 %   Neutrophils Relative % 47 %   Neutro Abs 1.2 (L) 1.7 - 7.7 K/uL   Lymphocytes Relative 41 %   Lymphs Abs  1.1 0.7 - 4.0 K/uL   Monocytes Relative 10 %   Monocytes Absolute 0.3 0.1 - 1.0 K/uL   Eosinophils Relative 2 %   Eosinophils Absolute 0.1 0.0 - 0.5 K/uL   Basophils Relative 0 %   Basophils Absolute 0.0 0.0 - 0.1 K/uL   Immature Granulocytes 0 %   Abs Immature Granulocytes 0.00 0.00 - 0.07 K/uL    Comment: Performed at [facility], [address] [place], [place] [address]  CBC with Differential (Cancer Center Only)     Status: None   Collection Time: 10/31/22 11:30 AM  Result Value Ref Range   WBC Count 4.2 4.0 - 10.5 K/uL   RBC 4.39 3.87 - 5.11 MIL/uL   Hemoglobin 12.3 12.0 - 15.0 g/dL   HCT 95.6 21.3 - 08.6 %   MCV 86.6 80.0 - 100.0 fL   MCH 28.0 26.0 - 34.0 pg   MCHC 32.4 30.0 - 36.0 g/dL   RDW 57.8 46.9 - 62.9 %   Platelet Count 337 150 - 400 K/uL   nRBC 0.0 0.0 - 0.2 %   Neutrophils Relative % 50 %   Neutro Abs 2.1 1.7 - 7.7 K/uL   Lymphocytes Relative 35 %   Lymphs Abs 1.5 0.7 - 4.0 K/uL    Monocytes Relative 11 %   Monocytes Absolute 0.4 0.1 - 1.0 K/uL   Eosinophils Relative 2 %   Eosinophils Absolute 0.1 0.0 - 0.5 K/uL   Basophils Relative 1 %   Basophils Absolute 0.0 0.0 - 0.1 K/uL   Immature Granulocytes 1 %   Abs Immature Granulocytes 0.04 0.00 - 0.07 K/uL    Comment: Performed at [facility], [address], [place], [place] [address]  CMP (Cancer Center only)     Status: Abnormal   Collection Time: 10/31/22 11:30 AM  Result Value Ref Range   Sodium 142 135 - 145 mmol/L   Potassium 3.8 3.5 - 5.1 mmol/L   Chloride 105 98 - 111 mmol/L   CO2 28 22 - 32 mmol/L   Glucose, Bld 115 (H) 70 - 99 mg/dL    Comment: Glucose reference range applies only to samples taken after fasting for at least 8 hours.   BUN 12 6 - 20 mg/dL   Creatinine 3.24 4.01 - 1.00 mg/dL   Calcium 9.7 8.9 - 02.7 mg/dL   Total Protein 6.3 (L) 6.5 - 8.1 g/dL   Albumin 3.7 3.5 - 5.0 g/dL   AST 22 15 - 41 U/L   ALT 32 0 - 44 U/L   Alkaline Phosphatase 69 38 - 126 U/L   Total Bilirubin 0.5 0.3 - 1.2 mg/dL   GFR, Estimated >25 >36 mL/min    Comment: (NOTE) Calculated using the CKD-EPI Creatinine Equation (2021)    Anion gap 9 5 - 15    Comment: Performed at [facility], [address], [place], [place] [address]  CMP (Cancer Center only)     Status: Abnormal   Collection Time: 11/07/22  1:55 PM  Result Value Ref Range   Sodium 142 135 - 145 mmol/L   Potassium 4.0 3.5 - 5.1 mmol/L   Chloride 103 98 - 111 mmol/L   CO2 33 (H) 22 - 32 mmol/L   Glucose, Bld 114 (H) 70 - 99 mg/dL    Comment: Glucose reference range applies only to samples taken after fasting for at least 8 hours.   BUN 18 6 -  20 mg/dL   Creatinine 6.57 8.46 - 1.00 mg/dL   Calcium 9.9 8.9 - 96.2 mg/dL   Total Protein 6.9 6.5 - 8.1 g/dL   Albumin 3.7 3.5 - 5.0 g/dL   AST 73 (H) 15 - 41 U/L   ALT 140 (H) 0 - 44 U/L   Alkaline Phosphatase 77 38 - 126 U/L   Total Bilirubin 0.6 0.3 -  1.2 mg/dL   GFR, Estimated >95 >28 mL/min    Comment: (NOTE) Calculated using the CKD-EPI Creatinine Equation (2021)    Anion gap 6 5 - 15    Comment: Performed at [facility], [address], [place], [place] [address]  CBC with Differential (Cancer Center Only)     Status: Abnormal   Collection Time: 11/07/22  1:55 PM  Result Value Ref Range   WBC Count 1.8 (L) 4.0 - 10.5 K/uL   RBC 4.19 3.87 - 5.11 MIL/uL   Hemoglobin 11.9 (L) 12.0 - 15.0 g/dL   HCT 40.1 02.7 - 25.3 %   MCV 86.6 80.0 - 100.0 fL   MCH 28.4 26.0 - 34.0 pg   MCHC 32.8 30.0 - 36.0 g/dL   RDW 66.4 40.3 - 47.4 %   Platelet Count 209 150 - 400 K/uL   nRBC 0.0 0.0 - 0.2 %   Neutrophils Relative % 35 %   Neutro Abs 0.6 (L) 1.7 - 7.7 K/uL   Lymphocytes Relative 58 %   Lymphs Abs 1.1 0.7 - 4.0 K/uL   Monocytes Relative 5 %   Monocytes Absolute 0.1 0.1 - 1.0 K/uL   Eosinophils Relative 1 %   Eosinophils Absolute 0.0 0.0 - 0.5 K/uL   Basophils Relative 1 %   Basophils Absolute 0.0 0.0 - 0.1 K/uL   Immature Granulocytes 0 %   Abs Immature Granulocytes 0.00 0.00 - 0.07 K/uL    Comment: Performed at [facility], [address] [place], [place] [address]  CMP (Cancer Center only)     Status: Abnormal   Collection Time: 11/14/22  1:38 PM  Result Value Ref Range   Sodium 142 135 - 145 mmol/L   Potassium 4.2 3.5 - 5.1 mmol/L   Chloride 104 98 - 111 mmol/L   CO2 30 22 - 32 mmol/L   Glucose, Bld 110 (H) 70 - 99 mg/dL    Comment: Glucose reference range applies only to samples taken after fasting for at least 8 hours.   BUN 15 6 - 20 mg/dL   Creatinine 3.87 (H) 5.64 - 1.00 mg/dL   Calcium 9.9 8.9 - 33.2 mg/dL   Total Protein 6.9 6.5 - 8.1 g/dL   Albumin 3.9 3.5 - 5.0 g/dL   AST 45 (H) 15 - 41 U/L   ALT 107 (H) 0 - 44 U/L   Alkaline Phosphatase 78 38 - 126 U/L   Total Bilirubin 0.5 0.3 - 1.2 mg/dL   GFR, Estimated 59 (L) >60 mL/min    Comment: (NOTE) Calculated  using the CKD-EPI Creatinine Equation (2021)    Anion gap 8 5 - 15    Comment: Performed at [facility], [address] [place], [place] [address]  CBC with Differential (Cancer Center Only)     Status: Abnormal   Collection Time: 11/14/22  1:38 PM  Result Value Ref Range   WBC Count 2.5 (L) 4.0 - 10.5 K/uL   RBC 4.20 3.87 - 5.11 MIL/uL   Hemoglobin 11.8 (L) 12.0 -  15.0 g/dL   HCT 86.5 78.4 - 69.6 %   MCV 86.0 80.0 - 100.0 fL   MCH 28.1 26.0 - 34.0 pg   MCHC 32.7 30.0 - 36.0 g/dL   RDW 29.5 28.4 - 13.2 %   Platelet Count 139 (L) 150 - 400 K/uL   nRBC 0.0 0.0 - 0.2 %   Neutrophils Relative % 34 %   Neutro Abs 0.9 (L) 1.7 - 7.7 K/uL   Lymphocytes Relative 49 %   Lymphs Abs 1.2 0.7 - 4.0 K/uL   Monocytes Relative 14 %   Monocytes Absolute 0.4 0.1 - 1.0 K/uL   Eosinophils Relative 2 %   Eosinophils Absolute 0.1 0.0 - 0.5 K/uL   Basophils Relative 0 %   Basophils Absolute 0.0 0.0 - 0.1 K/uL   Immature Granulocytes 1 %   Abs Immature Granulocytes 0.02 0.00 - 0.07 K/uL    Comment: Performed at [facility], [address], [place], [place] [address]  TSH     Status: None   Collection Time: 11/21/22 10:46 AM  Result Value Ref Range   TSH 3.194 0.350 - 4.500 uIU/mL    Comment: Performed by a 3rd Generation assay with a functional sensitivity of <=0.01 uIU/mL. Performed at [facility], [address] [place], [place] [address]   T4     Status: None   Collection Time: 11/21/22 10:46 AM  Result Value Ref Range   T4, Total 11.4 4.5 - 12.0 ug/dL    Comment: (NOTE) Performed At: [facility] [address] [place], [place] [id number] Jolene Schimke MD QV:[id number]   CMP (Cancer Center only)     Status: Abnormal   Collection Time: 11/21/22 10:46 AM  Result Value Ref Range   Sodium 141 135 - 145 mmol/L   Potassium 3.9 3.5 - 5.1 mmol/L   Chloride 105 98 - 111 mmol/L   CO2 29 22 - 32 mmol/L   Glucose,  Bld 113 (H) 70 - 99 mg/dL    Comment: Glucose reference range applies only to samples taken after fasting for at least 8 hours.   BUN 17 6 - 20 mg/dL   Creatinine 3.29 (H) 5.18 - 1.00 mg/dL   Calcium 9.8 8.9 - 84.1 mg/dL   Total Protein 6.5 6.5 - 8.1 g/dL   Albumin 3.9 3.5 - 5.0 g/dL   AST 24 15 - 41 U/L   ALT 38 0 - 44 U/L   Alkaline Phosphatase 67 38 - 126 U/L   Total Bilirubin 0.5 0.3 - 1.2 mg/dL   GFR, Estimated >66 >06 mL/min    Comment: (NOTE) Calculated using the CKD-EPI Creatinine Equation (2021)    Anion gap 7 5 - 15    Comment: Performed at [facility], [address] [place], [place] [address]  CBC with Differential (Cancer Center Only)     Status: Abnormal   Collection Time: 11/21/22 10:46 AM  Result Value Ref Range   WBC Count 3.8 (L) 4.0 - 10.5 K/uL   RBC 4.20 3.87 - 5.11 MIL/uL   Hemoglobin 11.9 (L) 12.0 - 15.0 g/dL   HCT 10.9 32.3 - 55.7 %   MCV 85.7 80.0 - 100.0 fL   MCH 28.3 26.0 - 34.0 pg   MCHC 33.1 30.0 - 36.0 g/dL   RDW 32.2 (H) 02.5 - 42.7 %   Platelet Count 363 150 - 400 K/uL   nRBC 0.0 0.0 - 0.2 %   Neutrophils Relative %  46 %   Neutro Abs 1.8 1.7 - 7.7 K/uL   Lymphocytes Relative 40 %   Lymphs Abs 1.5 0.7 - 4.0 K/uL   Monocytes Relative 10 %   Monocytes Absolute 0.4 0.1 - 1.0 K/uL   Eosinophils Relative 2 %   Eosinophils Absolute 0.1 0.0 - 0.5 K/uL   Basophils Relative 1 %   Basophils Absolute 0.0 0.0 - 0.1 K/uL   Immature Granulocytes 1 %   Abs Immature Granulocytes 0.04 0.00 - 0.07 K/uL    Comment: Performed at [facility], [address], [place], [place] [address]  CMP (Cancer Center only)     Status: Abnormal   Collection Time: 11/28/22  1:31 PM  Result Value Ref Range   Sodium 141 135 - 145 mmol/L   Potassium 3.8 3.5 - 5.1 mmol/L   Chloride 103 98 - 111 mmol/L   CO2 31 22 - 32 mmol/L   Glucose, Bld 121 (H) 70 - 99 mg/dL    Comment: Glucose reference range applies only to samples  taken after fasting for at least 8 hours.   BUN 15 6 - 20 mg/dL   Creatinine 6.04 5.40 - 1.00 mg/dL   Calcium 9.6 8.9 - 98.1 mg/dL   Total Protein 6.7 6.5 - 8.1 g/dL   Albumin 3.9 3.5 - 5.0 g/dL   AST 50 (H) 15 - 41 U/L   ALT 61 (H) 0 - 44 U/L   Alkaline Phosphatase 70 38 - 126 U/L   Total Bilirubin 0.7 0.3 - 1.2 mg/dL   GFR, Estimated >19 >14 mL/min    Comment: (NOTE) Calculated using the CKD-EPI Creatinine Equation (2021)    Anion gap 7 5 - 15    Comment: Performed at [facility], [address], [place], [place] [address]  CBC with Differential (Cancer Center Only)     Status: Abnormal   Collection Time: 11/28/22  1:31 PM  Result Value Ref Range   WBC Count 1.8 (L) 4.0 - 10.5 K/uL   RBC 3.99 3.87 - 5.11 MIL/uL   Hemoglobin 11.5 (L) 12.0 - 15.0 g/dL   HCT 62.1 (L) 30.8 - 65.7 %   MCV 85.5 80.0 - 100.0 fL   MCH 28.8 26.0 - 34.0 pg   MCHC 33.7 30.0 - 36.0 g/dL   RDW 84.6 (H) 96.2 - 95.2 %   Platelet Count 204 150 - 400 K/uL   nRBC 0.0 0.0 - 0.2 %   Neutrophils Relative % 32 %   Neutro Abs 0.6 (L) 1.7 - 7.7 K/uL   Lymphocytes Relative 58 %   Lymphs Abs 1.0 0.7 - 4.0 K/uL   Monocytes Relative 7 %   Monocytes Absolute 0.1 0.1 - 1.0 K/uL   Eosinophils Relative 1 %   Eosinophils Absolute 0.0 0.0 - 0.5 K/uL   Basophils Relative 1 %   Basophils Absolute 0.0 0.0 - 0.1 K/uL   Immature Granulocytes 1 %   Abs Immature Granulocytes 0.01 0.00 - 0.07 K/uL    Comment: Performed at [facility], [address], [place], [place] [address]  CMP (Cancer Center only)     Status: Abnormal   Collection Time: 12/06/22 11:40 AM  Result Value Ref Range   Sodium 141 135 - 145 mmol/L   Potassium 3.7 3.5 - 5.1 mmol/L   Chloride 103 98 - 111 mmol/L   CO2 30 22 - 32 mmol/L   Glucose, Bld 165 (H) 70 -  99 mg/dL    Comment: Glucose reference range applies only to samples taken after fasting for at least 8 hours.   BUN 12 6 - 20 mg/dL   Creatinine  1.61 (H) 0.44 - 1.00 mg/dL   Calcium 9.1 8.9 - 09.6 mg/dL   Total Protein 7.0 6.5 - 8.1 g/dL   Albumin 4.0 3.5 - 5.0 g/dL   AST 34 15 - 41 U/L   ALT 59 (H) 0 - 44 U/L   Alkaline Phosphatase 76 38 - 126 U/L   Total Bilirubin 0.4 0.3 - 1.2 mg/dL   GFR, Estimated 60 (L) >60 mL/min    Comment: (NOTE) Calculated using the CKD-EPI Creatinine Equation (2021)    Anion gap 8 5 - 15    Comment: Performed at [facility], [address] [place], [place] [address]  CBC with Differential (Cancer Center Only)     Status: Abnormal   Collection Time: 12/06/22 11:40 AM  Result Value Ref Range   WBC Count 2.7 (L) 4.0 - 10.5 K/uL   RBC 3.85 (L) 3.87 - 5.11 MIL/uL   Hemoglobin 11.1 (L) 12.0 - 15.0 g/dL   HCT 98.1 (L) 19.1 - 47.8 %   MCV 86.5 80.0 - 100.0 fL   MCH 28.8 26.0 - 34.0 pg   MCHC 33.3 30.0 - 36.0 g/dL   RDW 29.5 (H) 62.1 - 30.8 %   Platelet Count 144 (L) 150 - 400 K/uL   nRBC 0.0 0.0 - 0.2 %   Neutrophils Relative % 45 %   Neutro Abs 1.2 (L) 1.7 - 7.7 K/uL   Lymphocytes Relative 43 %   Lymphs Abs 1.2 0.7 - 4.0 K/uL   Monocytes Relative 10 %   Monocytes Absolute 0.3 0.1 - 1.0 K/uL   Eosinophils Relative 1 %   Eosinophils Absolute 0.0 0.0 - 0.5 K/uL   Basophils Relative 0 %   Basophils Absolute 0.0 0.0 - 0.1 K/uL   Immature Granulocytes 1 %   Abs Immature Granulocytes 0.02 0.00 - 0.07 K/uL    Comment: Performed at [facility], [address], [place], [place] [address]  CMP (Cancer Center only)     Status: Abnormal   Collection Time: 12/12/22  9:55 AM  Result Value Ref Range   Sodium 142 135 - 145 mmol/L   Potassium 3.8 3.5 - 5.1 mmol/L   Chloride 105 98 - 111 mmol/L   CO2 27 22 - 32 mmol/L   Glucose, Bld 155 (H) 70 - 99 mg/dL    Comment: Glucose reference range applies only to samples taken after fasting for at least 8 hours.   BUN 9 6 - 20 mg/dL   Creatinine 6.96 2.95 - 1.00 mg/dL   Calcium 9.3 8.9 - 28.4 mg/dL   Total  Protein 6.9 6.5 - 8.1 g/dL   Albumin 3.9 3.5 - 5.0 g/dL   AST 19 15 - 41 U/L   ALT 24 0 - 44 U/L   Alkaline Phosphatase 74 38 - 126 U/L   Total Bilirubin 0.4 0.3 - 1.2 mg/dL   GFR, Estimated >13 >24 mL/min    Comment: (NOTE) Calculated using the CKD-EPI Creatinine Equation (2021)    Anion gap 10 5 - 15    Comment: Performed at [facility], [address], [place], [place] [address]  CBC with Differential ([facility] Only)     Status: Abnormal   Collection Time: 12/12/22  9:55 AM  Result  Value Ref Range   WBC Count 3.5 (L) 4.0 - 10.5 K/uL   RBC 3.94 3.87 - 5.11 MIL/uL   Hemoglobin 11.5 (L) 12.0 - 15.0 g/dL   HCT 16.1 (L) 09.6 - 04.5 %   MCV 87.1 80.0 - 100.0 fL   MCH 29.2 26.0 - 34.0 pg   MCHC 33.5 30.0 - 36.0 g/dL   RDW 40.9 (H) 81.1 - 91.4 %   Platelet Count 261 150 - 400 K/uL   nRBC 0.0 0.0 - 0.2 %   Neutrophils Relative % 44 %   Neutro Abs 1.5 (L) 1.7 - 7.7 K/uL   Lymphocytes Relative 42 %   Lymphs Abs 1.5 0.7 - 4.0 K/uL   Monocytes Relative 10 %   Monocytes Absolute 0.4 0.1 - 1.0 K/uL   Eosinophils Relative 2 %   Eosinophils Absolute 0.1 0.0 - 0.5 K/uL   Basophils Relative 1 %   Basophils Absolute 0.0 0.0 - 0.1 K/uL   Immature Granulocytes 1 %   Abs Immature Granulocytes 0.04 0.00 - 0.07 K/uL    Comment: Performed at [facility], [address], [place], [place] [address]  CMP (Cancer Center only)     Status: Abnormal   Collection Time: 12/19/22  1:40 PM  Result Value Ref Range   Sodium 140 135 - 145 mmol/L   Potassium 4.1 3.5 - 5.1 mmol/L   Chloride 103 98 - 111 mmol/L   CO2 31 22 - 32 mmol/L   Glucose, Bld 122 (H) 70 - 99 mg/dL    Comment: Glucose reference range applies only to samples taken after fasting for at least 8 hours.   BUN 13 6 - 20 mg/dL   Creatinine 6.21 3.08 - 1.00 mg/dL   Calcium 9.5 8.9 - 65.7 mg/dL   Total Protein 7.0 6.5 - 8.1 g/dL   Albumin 3.9 3.5 - 5.0 g/dL   AST 32 15 - 41 U/L   ALT  32 0 - 44 U/L   Alkaline Phosphatase 70 38 - 126 U/L   Total Bilirubin 0.7 0.3 - 1.2 mg/dL   GFR, Estimated >84 >69 mL/min    Comment: (NOTE) Calculated using the CKD-EPI Creatinine Equation (2021)    Anion gap 6 5 - 15    Comment: Performed at [facility], [address] [place], [place] [address]  CBC with Differential (Cancer Center Only)     Status: Abnormal   Collection Time: 12/19/22  1:40 PM  Result Value Ref Range   WBC Count 1.5 (L) 4.0 - 10.5 K/uL   RBC 3.51 (L) 3.87 - 5.11 MIL/uL   Hemoglobin 10.5 (L) 12.0 - 15.0 g/dL   HCT 84.1 (L) 32.4 - 40.1 %   MCV 88.6 80.0 - 100.0 fL   MCH 29.9 26.0 - 34.0 pg   MCHC 33.8 30.0 - 36.0 g/dL   RDW 02.7 (H) 25.3 - 66.4 %   Platelet Count 190 150 - 400 K/uL   nRBC 0.0 0.0 - 0.2 %   Neutrophils Relative % 28 %   Neutro Abs 0.4 (LL) 1.7 - 7.7 K/uL    Comment: REPEATED TO VERIFY THIS CRITICAL RESULT HAS VERIFIED AND BEEN CALLED TO MANDY YORK, RN BY SUTCAVAGE, PAM ON 08 20 2024 AT 1348, AND HAS BEEN READ BACK.     Lymphocytes Relative 63 %   Lymphs Abs 1.0 0.7 - 4.0 K/uL   Monocytes Relative 6 %   Monocytes Absolute 0.1 0.1 -  1.0 K/uL   Eosinophils Relative 1 %   Eosinophils Absolute 0.0 0.0 - 0.5 K/uL   Basophils Relative 1 %   Basophils Absolute 0.0 0.0 - 0.1 K/uL   Immature Granulocytes 1 %   Abs Immature Granulocytes 0.01 0.00 - 0.07 K/uL    Comment: Performed at [facility], [address], [place], [place] [address]  CMP (Cancer Center only)     Status: Abnormal   Collection Time: 12/26/22  1:37 PM  Result Value Ref Range   Sodium 143 135 - 145 mmol/L   Potassium 4.1 3.5 - 5.1 mmol/L   Chloride 105 98 - 111 mmol/L   CO2 31 22 - 32 mmol/L   Glucose, Bld 99 70 - 99 mg/dL    Comment: Glucose reference range applies only to samples taken after fasting for at least 8 hours.   BUN 8 6 - 20 mg/dL   Creatinine 3.66 (H) 4.40 - 1.00 mg/dL   Calcium 9.2 8.9 - 34.7 mg/dL   Total  Protein 6.7 6.5 - 8.1 g/dL   Albumin 3.9 3.5 - 5.0 g/dL   AST 31 15 - 41 U/L   ALT 37 0 - 44 U/L   Alkaline Phosphatase 78 38 - 126 U/L   Total Bilirubin 0.4 0.3 - 1.2 mg/dL   GFR, Estimated 56 (L) >60 mL/min    Comment: (NOTE) Calculated using the CKD-EPI Creatinine Equation (2021)    Anion gap 7 5 - 15    Comment: Performed at [facility], [address], [place], [place] [address]  CBC with Differential (Cancer Center Only)     Status: Abnormal   Collection Time: 12/26/22  1:37 PM  Result Value Ref Range   WBC Count 4.6 4.0 - 10.5 K/uL   RBC 3.51 (L) 3.87 - 5.11 MIL/uL   Hemoglobin 10.3 (L) 12.0 - 15.0 g/dL   HCT 63.8 (L) 75.6 - 43.3 %   MCV 89.7 80.0 - 100.0 fL   MCH 29.3 26.0 - 34.0 pg   MCHC 32.7 30.0 - 36.0 g/dL   RDW 29.5 (H) 18.8 - 41.6 %   Platelet Count 110 (L) 150 - 400 K/uL   nRBC 0.4 (H) 0.0 - 0.2 %   Neutrophils Relative % 55 %   Neutro Abs 2.5 1.7 - 7.7 K/uL   Lymphocytes Relative 25 %   Lymphs Abs 1.2 0.7 - 4.0 K/uL   Monocytes Relative 10 %   Monocytes Absolute 0.5 0.1 - 1.0 K/uL   Eosinophils Relative 0 %   Eosinophils Absolute 0.0 0.0 - 0.5 K/uL   Basophils Relative 0 %   Basophils Absolute 0.0 0.0 - 0.1 K/uL   Immature Granulocytes 10 %    Comment: Increased IG's, likely caused by Bone Marrow Colony Stimulating Factor received within 30 days.   Abs Immature Granulocytes 0.47 (H) 0.00 - 0.07 K/uL    Comment: Performed at [facility], [address] [place], [place] [address]  CMP (Cancer Center only)     Status: Abnormal   Collection Time: 01/03/23  8:52 AM  Result Value Ref Range   Sodium 141 135 - 145 mmol/L   Potassium 3.7 3.5 - 5.1 mmol/L   Chloride 104 98 - 111 mmol/L   CO2 29 22 - 32 mmol/L   Glucose, Bld 120 (H) 70 - 99 mg/dL    Comment: Glucose reference range applies only to samples taken after fasting for at least  8 hours.   BUN 14 6 - 20 mg/dL   Creatinine 2.35 (H) 5.73 - 1.00 mg/dL    Calcium 9.4 8.9 - 22.0 mg/dL   Total Protein 6.7 6.5 - 8.1 g/dL   Albumin 3.9 3.5 - 5.0 g/dL   AST 19 15 - 41 U/L   ALT 15 0 - 44 U/L   Alkaline Phosphatase 61 38 - 126 U/L   Total Bilirubin 0.6 0.3 - 1.2 mg/dL   GFR, Estimated 58 (L) >60 mL/min    Comment: (NOTE) Calculated using the CKD-EPI Creatinine Equation (2021)    Anion gap 8 5 - 15    Comment: Performed at [facility], [address], [place], [place] [address]  CBC with Differential (Cancer Center Only)     Status: Abnormal   Collection Time: 01/03/23  8:52 AM  Result Value Ref Range   WBC Count 3.5 (L) 4.0 - 10.5 K/uL   RBC 3.29 (L) 3.87 - 5.11 MIL/uL   Hemoglobin 10.0 (L) 12.0 - 15.0 g/dL   HCT 06.2 (L) 37.6 - 28.3 %   MCV 91.2 80.0 - 100.0 fL   MCH 30.4 26.0 - 34.0 pg   MCHC 33.3 30.0 - 36.0 g/dL   RDW 15.1 (H) 76.1 - 60.7 %   Platelet Count 240 150 - 400 K/uL   nRBC 0.0 0.0 - 0.2 %   Neutrophils Relative % 54 %   Neutro Abs 1.9 1.7 - 7.7 K/uL   Lymphocytes Relative 33 %   Lymphs Abs 1.1 0.7 - 4.0 K/uL   Monocytes Relative 10 %   Monocytes Absolute 0.3 0.1 - 1.0 K/uL   Eosinophils Relative 1 %   Eosinophils Absolute 0.0 0.0 - 0.5 K/uL   Basophils Relative 1 %   Basophils Absolute 0.0 0.0 - 0.1 K/uL   Immature Granulocytes 1 %   Abs Immature Granulocytes 0.02 0.00 - 0.07 K/uL    Comment: Performed at [facility], [address] [place], [place] [address]     Psychiatric Specialty Exam: Physical Exam  Review of Systems  Weight 300 lb (136.1 kg), last menstrual period 05/02/2017.There is no height or weight on file to calculate BMI.  General Appearance: Casual  Eye Contact:  Good  Speech:  Slow  Volume:  Decreased  Mood:  Euthymic  Affect:  Appropriate  Thought Process:  Goal Directed  Orientation:  Full (Time, Place, and Person)  Thought Content:  WDL  Suicidal Thoughts:  No  Homicidal Thoughts:  No  Memory:  Immediate;   Good Recent;    Good Remote;   Good  Judgement:  Intact  Insight:  Present  Psychomotor Activity:  Normal  Concentration:  Concentration: Fair and Attention Span: Fair  Recall:  Good  Fund of Knowledge:  Good  Language:  Good  Akathisia:  No  Handed:  Right  AIMS (if indicated):     Assets:  Communication Skills Desire for Improvement Housing Resilience Social Support Transportation  ADL's:  Intact  Cognition:  WNL  Sleep:  ok     Assessment/Plan: Tremor - Plan: benztropine (COGENTIN) 0.5 MG tablet  Bipolar 1 disorder (HCC) - Plan: benztropine (COGENTIN) 0.5 MG tablet, lurasidone (LATUDA) 40 MG TABS tablet  Anxiety - Plan: lurasidone (LATUDA) 40 MG TABS tablet  Patient is stable on current medication.  Reviewed blood work results.  She is in good spirits and recently finished chemotherapy and happy that things are going well.  Continue  Latuda 40 mg daily and Cogentin 0.5 mg at bedtime.  She is using CPAP that is helping her sleep.  Recommend to call us back if she is any question or any concern.  Follow-up in 3 months.   Follow Up Instructions:     I discussed the assessment and treatment plan with the patient. The patient was provided an opportunity to ask questions and all were answered. The patient agreed with the plan and demonstrated an understanding of the instructions.   The patient was advised to call back or seek an in-person evaluation if the symptoms worsen or if the condition fails to improve as anticipated.    Collaboration of Care: Other provider involved in patient's care AEB notes are available in epic to review.  Patient/Guardian was advised Release of Information must be obtained prior to any record release in order to collaborate their care with an outside provider. Patient/Guardian was advised if they have not already done so to contact the registration department to sign all necessary forms in order for [place] to release information regarding their care.   Consent:  Patient/Guardian gives verbal consent for treatment and assignment of benefits for services provided during this visit. Patient/Guardian expressed understanding and agreed to proceed.     I provided 16 minutes of non face to face time during this encounter.  Note: This document was prepared by [employer] voice dictation technology and any errors that results from this process are unintentional.    Cleotis Nipper, MD 01/09/2023

## 2023-01-11 ENCOUNTER — Ambulatory Visit: Payer: BC Managed Care – PPO | Admitting: Physician Assistant

## 2023-01-11 ENCOUNTER — Ambulatory Visit (HOSPITAL_COMMUNITY)
Admission: RE | Admit: 2023-01-11 | Discharge: 2023-01-11 | Disposition: A | Discharge status: Home / Self Care | Payer: BC Managed Care – PPO | Source: Ambulatory Visit | Attending: Internal Medicine | Admitting: Internal Medicine

## 2023-01-11 ENCOUNTER — Other Ambulatory Visit: Payer: Self-pay | Admitting: Medical Oncology

## 2023-01-11 ENCOUNTER — Other Ambulatory Visit: Payer: Self-pay | Admitting: Internal Medicine

## 2023-01-11 ENCOUNTER — Telehealth: Payer: Self-pay | Admitting: Medical Oncology

## 2023-01-11 ENCOUNTER — Inpatient Hospital Stay (HOSPITAL_BASED_OUTPATIENT_CLINIC_OR_DEPARTMENT_OTHER): Payer: BC Managed Care – PPO | Admitting: Physician Assistant

## 2023-01-11 VITALS — BP 122/74 | HR 77 | Temp 99.0°F | Resp 17 | Wt 299.3 lb

## 2023-01-11 DIAGNOSIS — M79604 Pain in right leg: Secondary | ICD-10-CM

## 2023-01-11 DIAGNOSIS — M79605 Pain in left leg: Secondary | ICD-10-CM

## 2023-01-11 DIAGNOSIS — Z5112 Encounter for antineoplastic immunotherapy: Secondary | ICD-10-CM | POA: Diagnosis not present

## 2023-01-11 DIAGNOSIS — C3492 Malignant neoplasm of unspecified part of left bronchus or lung: Secondary | ICD-10-CM | POA: Diagnosis not present

## 2023-01-11 DIAGNOSIS — I10 Essential (primary) hypertension: Secondary | ICD-10-CM | POA: Diagnosis not present

## 2023-01-11 DIAGNOSIS — M7989 Other specified soft tissue disorders: Secondary | ICD-10-CM | POA: Insufficient documentation

## 2023-01-11 MED ORDER — CEPHALEXIN 500 MG PO CAPS: 500.0000 mg | ORAL_CAPSULE | Freq: Four times a day (QID) | ORAL | 0 refills | Status: AC

## 2023-01-11 NOTE — Telephone Encounter (Signed)
Constant pain in right shin and calf since Monday -It is painful to touch and gets worse when she walks and day progresses. R ankle swelling. Denies rash , redness. Dr Arbutus Ped requested [facility] appt today . Last alimta /Keytruda one week ago.

## 2023-01-11 NOTE — Patient Instructions (Addendum)
-  Prescription for Keflex sent to the pharmacy.  This is to treat possible cellulitis infection.  Continue Tylenol for pain.  -Your blood pressure today was low and has been at previous visits recently as well.  You are on multiple blood pressure medications therefore I recommend you see the cardiologist for medication management.  I would start keeping a log of your daily blood pressures, recommend checking it in the morning when you wake up and for you take your evening medications.  Please call the cardiology office to schedule an appointment. -If your systolic (top number)  is less than 110 I would hold the lisinopril and amlodipine.  If you develop fever or worsening signs of infection on your leg please seek urgent evaluation in the emergency department.

## 2023-01-11 NOTE — Progress Notes (Signed)
Lower extremity venous bilateral study completed.   Please see CV Proc for preliminary results.   Rachel Hodge, RDMS, RVT  

## 2023-01-11 NOTE — Progress Notes (Signed)
Symptom Management Consult Note [facility]    Patient Care Team: [name], [name], NP as PCP - General (Internal Medicine) Rollene Rotunda, MD as PCP - Cardiology (Cardiology)    Name / MRN / DOB: Madison Palmer  [id number]  1966/02/28   Date of visit: 01/11/2023   Chief Complaint/Reason for visit: right leg pain   Current Therapy: Rande Lawman and alimta  Last treatment:  Day 1   Cycle 6 on 01/03/23   ASSESSMENT & PLAN: Patient is a 57 y.o. female with oncologic history of stage IV non-small cell lung cancer, adenocarcinoma followed by Dr. Arbutus Ped.  I have viewed most recent oncology note and lab work.    #Stage IV non-small cell lung cancer, adenocarcinoma  - Next appointment with oncologist is 01/23/23   #Hypertension -Managed by cardiology. Recent clinic visits with soft BPs per chart review. Today it is 99/51. Patient is on multiple HTN medications. Encouraged her to keep BP log and follow up with cardiologist to discuss medications. Advised if systolic is <110 to hold dose of lisinopril and amlodipine. Patient agreeable with plan.  #Cellulitis -DVT study is negative. RLE with erythema and pain, PE suggestive of cellulitis.  - Erythema outlined with skin marker for monitoring. Discussed with Dr. Arbutus Ped who agrees for trial of antibiotics to cover for cellulitis. Patient will return Monday for recheck. If no improvement will consider PO prednisone incase this is pemetrexed induced pseudo cellulitis.  Strict ED precautions discussed should symptoms worsen.    Heme/Onc History: Oncology History  Metastatic lung cancer (metastasis from lung to other site) [facility])  09/28/2022 Initial Diagnosis   Metastatic lung cancer (metastasis from lung to other site) [facility])   10/10/2022 -  Chemotherapy   Patient is on Treatment Plan : LUNG Carboplatin (5) + Pemetrexed (500) + Pembrolizumab (200) D1 q21d Induction x 4 cycles / Maintenance Pemetrexed (500) + Pembrolizumab  (200) D1 q21d     Adenocarcinoma of left lung, stage 4 (HCC)  09/28/2022 Initial Diagnosis   Adenocarcinoma of left lung, stage 4 (HCC)   09/28/2022 Cancer Staging   Staging form: Lung, AJCC 8th Edition - Clinical: Stage IVB (cT2a, cN3, cM1c) - Signed by Si Gaul, MD on 09/28/2022       Interval history-: Madison Palmer is a 57 y.o. female with oncologic history as above presenting to [facility] today with chief complaint of right leg pain and redness. She is accompanied by spouse and friend who provide additional history.   Patient states she first noticed throbbing pain in her right lower leg x 2 days ago. Yesterday the pain worsened and she noticed redness on her calf. She denies any injury or trauma. She has been taking Tylenol for pain with some improvement. She noticed yesterday after work her right ankle was swollen. When she woke up this morning the swelling had resolved. She has chronic swelling of left leg with history of lymphedema, denies any changes to left leg. She denies any fever or chills. Denies history of similar symptoms.    ROS  All other systems are reviewed and are negative for acute change except as noted in the HPI.    Allergies  Allergen Reactions   Other Other (See Comments)    Hydralazine Hcl Other reaction(s): Other (See Comments) Instructed not to take by Cardiology.   Fentanyl Hives    Other reaction(s): hives had fentanyl recently with benadryl, did OK   Levofloxacin Hives   Midazolam Hives  Pollen Extract     seasonal   Atorvastatin Other (See Comments)    Muscle pain in legs  Abdominal pain   Hydralazine Hcl Other (See Comments)    Hypercalcemia    Rosuvastatin Other (See Comments)    Abdominal pain Other reaction(s): diarrhea     Past Medical History:  Diagnosis Date   Acute on chronic respiratory failure with hypoxia and hypercapnia (HCC) 09/15/2018   Anemia    Anxiety    Arrhythmia    tachycardia   Arthritis    Bipolar 1  disorder (HCC) 09/12/2018   Bipolar I disorder, current or most recent episode manic, with psychotic features (HCC)    Brief psychotic disorder (HCC) 08/22/2018   Chronic respiratory failure with hypoxia (HCC) 05/10/2018   Formatting of this note might be different from the original. Last Assessment & Plan:  Continue 1 L continuous on exertion and during sleep. On her next visit we will check OSA on CPAP/room air to decide whether she needs to take oxygen along with her on her cruise in May   Depression    Diverticulitis    Generalized edema 03/12/2018   GERD (gastroesophageal reflux disease)    Glaucoma    Hyperlipidemia    Hyperparathyroidism (HCC)    Hypertension    Inflammatory polyps of colon (HCC)    Leg swelling 01/14/2019   LVH (left ventricular hypertrophy)    Lymphedema    Morbid (severe) obesity due to excess calories (HCC) 05/11/2017   PCOS (polycystic ovarian syndrome)    Prediabetes    Recurrent UTI    Sleep apnea    CPAP   Thyroid disease    Vitamin D deficiency      Past Surgical History:  Procedure Laterality Date   BIOPSY  09/27/2022   Procedure: BIOPSY;  Surgeon: Lemar Lofty., MD;  Location: [facility] ENDOSCOPY;  Service: Gastroenterology;;   BREAST BIOPSY  2015   CESAREAN SECTION  2004   CHOLECYSTECTOMY     COLONOSCOPY WITH PROPOFOL N/A 09/27/2022   Procedure: COLONOSCOPY WITH PROPOFOL;  Surgeon: Lemar Lofty., MD;  Location: [facility] ENDOSCOPY;  Service: Gastroenterology;  Laterality: N/A;   ESOPHAGOGASTRODUODENOSCOPY (EGD) WITH PROPOFOL N/A 09/27/2022   Procedure: ESOPHAGOGASTRODUODENOSCOPY (EGD) WITH PROPOFOL;  Surgeon: Meridee Score Netty Starring., MD;  Location: [facility] ENDOSCOPY;  Service: Gastroenterology;  Laterality: N/A;   INNER EAR SURGERY     ear and sinus surgery   LAPAROSCOPIC REPAIR AND REMOVAL OF GASTRIC BAND     MALONEY DILATION  09/27/2022   Procedure: MALONEY DILATION;  Surgeon: Lemar Lofty., MD;  Location: [facility] ENDOSCOPY;  Service:  Gastroenterology;;   OOPHORECTOMY Left    POLYPECTOMY  09/27/2022   Procedure: POLYPECTOMY;  Surgeon: Lemar Lofty., MD;  Location: [facility] ENDOSCOPY;  Service: Gastroenterology;;   TONSILLECTOMY AND ADENOIDECTOMY      Social History   Socioeconomic History   Marital status: Married    Spouse name: Not on file   Number of children: 1   Years of education: Not on file   Highest education level: Not on file  Occupational History   Occupation: Unemployed  Tobacco Use   Smoking status: Never   Smokeless tobacco: Never  Vaping Use   Vaping status: Never Used  Substance and Sexual Activity   Alcohol use: Yes    Comment: social   Drug use: No   Sexual activity: Not Currently  Other Topics Concern   Not on file  Social History Narrative   Lives with  son    Currently separated from spouse   Left handed   Caffeine: coffee daily: half-caff 3 cups/day and decaf maybe 3-5 cups/day, occasional mtn dew, tea 1-2 times per week   Social Determinants of Health   Financial Resource Strain: Low Risk  (07/03/2022)   Overall Financial Resource Strain (CARDIA)    Difficulty of Paying Living Expenses: Not hard at all  Food Insecurity: No Food Insecurity (07/03/2022)   Hunger Vital Sign    Worried About Running Out of Food in the Last Year: Never true    Ran Out of Food in the Last Year: Never true  Transportation Needs: No Transportation Needs (07/03/2022)   PRAPARE - Administrator, Civil Service (Medical): No    Lack of Transportation (Non-Medical): No  Physical Activity: Inactive (07/03/2022)   Exercise Vital Sign    Days of Exercise per Week: 0 days    Minutes of Exercise per Session: 0 min  Stress: No Stress Concern Present (07/03/2022)   [employer] of Occupational Health - Occupational Stress Questionnaire    Feeling of Stress : Not at all  Social Connections: Unknown (11/11/2018)   Received from [facility], [facility]   Social  Connection and Isolation Panel [NHANES]    Frequency of Communication with Friends and Family: Patient declined    Frequency of Social Gatherings with Friends and Family: Patient declined    Attends Religious Services: Patient declined    Database administrator or Organizations: Patient declined    Attends Banker Meetings: Patient declined    Marital Status: Patient declined  Intimate Partner Violence: Unknown (08/02/2021)   Received from [employer], [facility]   HITS    Physically Hurt: Not on file    Insult or Talk Down To: Not on file    Threaten Physical Harm: Not on file    Scream or Curse: Not on file    Family History  Adopted: Yes  Problem Relation Age of Onset   Hearing loss Son        right    Healthy Son    Breast cancer Neg Hx      Current Outpatient Medications:    cephALEXin (KEFLEX) 500 MG capsule, Take 1 capsule (500 mg total) by mouth every 6 (six) hours for 7 days., Disp: 28 capsule, Rfl: 0   acetaminophen (TYLENOL) 500 MG tablet, Take 1,000 mg by mouth 3 (three) times daily as needed for moderate pain or headache., Disp: , Rfl:    amLODipine (NORVASC) 10 MG tablet, TAKE 1 TABLET BY MOUTH EVERY DAY, Disp: 90 tablet, Rfl: 3   atorvastatin (LIPITOR) 20 MG tablet, TAKE 1 TABLET(20 MG) BY MOUTH 3 TIMES A WEEK, Disp: 12 tablet, Rfl: 5   benztropine (COGENTIN) 0.5 MG tablet, Take 1 tablet (0.5 mg total) by mouth at bedtime., Disp: 30 tablet, Rfl: 2   cetirizine (ZYRTEC) 10 MG tablet, TAKE 1 TABLET BY MOUTH EVERY DAY, Disp: 90 tablet, Rfl: 1   Cholecalciferol (VITAMIN D3 MAXIMUM STRENGTH) 125 MCG (5000 UT) capsule, Take 5,000 Units by mouth daily., Disp: , Rfl:    cinacalcet (SENSIPAR) 30 MG tablet, Take 30 mg by mouth daily., Disp: , Rfl:    cloNIDine (CATAPRES) 0.1 MG tablet, TAKE 1 TABLET(0.1 MG) BY MOUTH THREE TIMES DAILY, Disp: 90 tablet, Rfl: 3   esomeprazole (NEXIUM) 40 MG capsule, TAKE 1 CAPSULE BY MOUTH TWICE DAILY, Disp: 180 capsule, Rfl: 2  fenofibrate (TRICOR) 145 MG tablet, TAKE 1 TABLET(145 MG) BY MOUTH DAILY, Disp: 90 tablet, Rfl: 3   folic acid (FOLVITE) 1 MG tablet, TAKE 1 TABLET BY MOUTH DAILY, Disp: 30 tablet, Rfl: 2   furosemide (LASIX) 40 MG tablet, TAKE 1 TABLET BY MOUTH EVERY DAY, Disp: 90 tablet, Rfl: 3   gabapentin (NEURONTIN) 100 MG capsule, TAKE 2 CAPSULES(200 MG) BY MOUTH AT BEDTIME, Disp: 180 capsule, Rfl: 3   labetalol (NORMODYNE) 300 MG tablet, TAKE 1.5 TABLETS BY MOUTH TWICE DAILY, Disp: 270 tablet, Rfl: 3   levothyroxine (SYNTHROID) 88 MCG tablet, Take 88 mcg by mouth daily., Disp: , Rfl:    lisinopril (ZESTRIL) 40 MG tablet, TAKE 1 TABLET BY MOUTH EVERY DAY, Disp: 90 tablet, Rfl: 1   lurasidone (LATUDA) 40 MG TABS tablet, Take 1 tablet (40 mg total) by mouth daily with breakfast., Disp: 30 tablet, Rfl: 2   oxyCODONE (OXY IR/ROXICODONE) 5 MG immediate release tablet, 1 tablet 30 minutes before the MRI.  May repeat once if needed. (Patient not taking: Reported on 01/09/2023), Disp: 10 tablet, Rfl: 0   Polyethylene Glycol 3350 (MIRALAX PO), Take 1 Capful by mouth daily as needed (Constipation)., Disp: , Rfl:    Potassium Chloride ER 20 MEQ TBCR, TAKE 1 TABLET BY MOUTH EVERY DAY, Disp: 90 tablet, Rfl: 2   prochlorperazine (COMPAZINE) 10 MG tablet, Take 1 tablet (10 mg total) by mouth every 6 (six) hours as needed., Disp: 30 tablet, Rfl: 2   sucralfate (CARAFATE) 1 g tablet, Take 1 tablet (1 g total) by mouth 3 (three) times daily before meals., Disp: 90 tablet, Rfl: 3   TART CHERRY PO, Take 1 tablet by mouth daily., Disp: , Rfl:   PHYSICAL EXAM: ECOG FS:1 - Symptomatic but completely ambulatory    Vitals:   01/11/23 1404 01/11/23 1540  BP: (!) 99/51 122/74  Pulse: 89 77  Resp: 17   Temp: 99 F (37.2 C)   TempSrc: Oral   SpO2: 94%   Weight: 299 lb 4.8 oz (135.8 kg)    Physical Exam Vitals and nursing note reviewed.  Constitutional:      Appearance: She is not ill-appearing or toxic-appearing.  HENT:      Head: Normocephalic.  Eyes:     Conjunctiva/sclera: Conjunctivae normal.  Cardiovascular:     Rate and Rhythm: Normal rate and regular rhythm.     Pulses: Normal pulses.     Heart sounds: Normal heart sounds.  Pulmonary:     Effort: Pulmonary effort is normal.     Breath sounds: Normal breath sounds.  Abdominal:     General: There is no distension.  Musculoskeletal:     Cervical back: Normal range of motion.     Comments: Chronic swelling left leg per patient.  Skin:    General: Skin is warm and dry.     Comments: Erythema on right thigh and right shin. No warmth. Tender to palpation. No wounds. No induration or palpable fluctuance.   Please see media below  Neurological:     Mental Status: She is alert.           LABORATORY DATA: I have reviewed the data as listed    Latest Ref Rng & Units 01/03/2023    8:52 AM 12/26/2022    1:37 PM 12/19/2022    1:40 PM  CBC  WBC 4.0 - 10.5 K/uL 3.5  4.6  1.5   Hemoglobin 12.0 - 15.0 g/dL 81.1  91.4  78.2  Hematocrit 36.0 - 46.0 % 30.0  31.5  31.1   Platelets 150 - 400 K/uL 240  110  190         Latest Ref Rng & Units 01/03/2023    8:52 AM 12/26/2022    1:37 PM 12/19/2022    1:40 PM  CMP  Glucose 70 - 99 mg/dL 528  99  413   BUN 6 - 20 mg/dL 14  8  13    Creatinine 0.44 - 1.00 mg/dL 2.44  0.10  2.72   Sodium 135 - 145 mmol/L 141  143  140   Potassium 3.5 - 5.1 mmol/L 3.7  4.1  4.1   Chloride 98 - 111 mmol/L 104  105  103   CO2 22 - 32 mmol/L 29  31  31    Calcium 8.9 - 10.3 mg/dL 9.4  9.2  9.5   Total Protein 6.5 - 8.1 g/dL 6.7  6.7  7.0   Total Bilirubin 0.3 - 1.2 mg/dL 0.6  0.4  0.7   Alkaline Phos 38 - 126 U/L 61  78  70   AST 15 - 41 U/L 19  31  32   ALT 0 - 44 U/L 15  37  32        RADIOGRAPHIC STUDIES (from last 24 hours if applicable) I have personally reviewed the radiological images as listed and agreed with the findings in the report. VAS [place] LOWER EXTREMITY VENOUS (DVT)  Result Date: 01/11/2023  Lower  Venous DVT Study Patient Name:  RONALYN KINTNER  Date of Exam:   01/11/2023 Medical Rec #: [id number]        Accession #:    [id number] Date of Birth: [birth year]        Patient Gender: F Patient Age:   54 years Exam Location:  [facility] Procedure:      VAS [place] LOWER EXTREMITY VENOUS (DVT) Referring Phys: [facility] MOHAMED --------------------------------------------------------------------------------  Indications: Pain and erythema- anterior aspect of right calf, mild swelling to left calf.  Risk Factors: Cancer - lung, on chemo. Limitations: Body habitus. Comparison Study: No recent prior studies. Performing Technologist: Jean Rosenthal RDMS, RVT  Examination Guidelines: A complete evaluation includes B-mode imaging, spectral Doppler, color Doppler, and power Doppler as needed of all accessible portions of each vessel. Bilateral testing is considered an integral part of a complete examination. Limited examinations for reoccurring indications may be performed as noted. The reflux portion of the exam is performed with the patient in reverse Trendelenburg.  +---------+---------------+---------+-----------+----------+--------------+ RIGHT    CompressibilityPhasicitySpontaneityPropertiesThrombus Aging +---------+---------------+---------+-----------+----------+--------------+ CFV      Full           Yes      Yes                                 +---------+---------------+---------+-----------+----------+--------------+ SFJ      Full                                                        +---------+---------------+---------+-----------+----------+--------------+ FV Prox  Full                                                        +---------+---------------+---------+-----------+----------+--------------+  FV Mid   Full                                                        +---------+---------------+---------+-----------+----------+--------------+ FV DistalFull                                                         +---------+---------------+---------+-----------+----------+--------------+ PFV      Full                                                        +---------+---------------+---------+-----------+----------+--------------+ POP      Full           Yes      Yes                                 +---------+---------------+---------+-----------+----------+--------------+ PTV      Full                                                        +---------+---------------+---------+-----------+----------+--------------+ PERO     Full                                                        +---------+---------------+---------+-----------+----------+--------------+ Gastroc  Full                                                        +---------+---------------+---------+-----------+----------+--------------+   +---------+---------------+---------+-----------+----------+--------------+ LEFT     CompressibilityPhasicitySpontaneityPropertiesThrombus Aging +---------+---------------+---------+-----------+----------+--------------+ CFV      Full           Yes      Yes                                 +---------+---------------+---------+-----------+----------+--------------+ SFJ      Full                                                        +---------+---------------+---------+-----------+----------+--------------+ FV Prox  Full                                                        +---------+---------------+---------+-----------+----------+--------------+  FV Mid   Full                                                        +---------+---------------+---------+-----------+----------+--------------+ FV DistalFull                                                        +---------+---------------+---------+-----------+----------+--------------+ PFV      Full                                                         +---------+---------------+---------+-----------+----------+--------------+ POP      Full           Yes      Yes                                 +---------+---------------+---------+-----------+----------+--------------+ PTV      Full                                                        +---------+---------------+---------+-----------+----------+--------------+ PERO     Full                                                        +---------+---------------+---------+-----------+----------+--------------+    Summary: RIGHT: - There is no evidence of deep vein thrombosis in the lower extremity.  - No cystic structure found in the popliteal fossa. - Trace fluid noted along the anterior aspect of the right calf at patient area of erythema/pain. Non-vascular.  LEFT: - There is no evidence of deep vein thrombosis in the lower extremity.  - No cystic structure found in the popliteal fossa.  *See table(s) above for measurements and observations.    Preliminary         Visit Diagnosis: 1. Acute leg pain, right   2. Adenocarcinoma of left lung, stage 4 (HCC)   3. Hypertension, unspecified type      No orders of the defined types were placed in this encounter.   All questions were answered. The patient knows to call the clinic with any problems, questions or concerns. No barriers to learning was detected.  A total of more than 30 minutes were spent on this encounter with face-to-face time and non-face-to-face time, including preparing to see the patient, ordering tests and/or medications, counseling the patient and coordination of care as outlined above.    Thank you for allowing me to participate in the care of this patient.    Shanon Ace, PA-C Department of Hematology/Oncology [facility] at [facility] [address] Phone: [phone]  [phone]  01/11/2023 10:16 PM

## 2023-01-15 ENCOUNTER — Inpatient Hospital Stay (HOSPITAL_BASED_OUTPATIENT_CLINIC_OR_DEPARTMENT_OTHER): Payer: BC Managed Care – PPO | Admitting: Physician Assistant

## 2023-01-15 VITALS — BP 129/76 | HR 81 | Temp 98.3°F | Resp 16

## 2023-01-15 DIAGNOSIS — C3492 Malignant neoplasm of unspecified part of left bronchus or lung: Secondary | ICD-10-CM | POA: Diagnosis not present

## 2023-01-15 DIAGNOSIS — M79604 Pain in right leg: Secondary | ICD-10-CM

## 2023-01-15 DIAGNOSIS — Z5112 Encounter for antineoplastic immunotherapy: Secondary | ICD-10-CM | POA: Diagnosis not present

## 2023-01-15 NOTE — Progress Notes (Signed)
Symptom Management Consult Note [facility]    Patient Care Team: [name], [name], NP as PCP - General (Internal Medicine) Madison Rotunda, MD as PCP - Cardiology (Cardiology)    Name / MRN / DOB: Madison Palmer  [id number]  10/12/1965   Date of visit: 01/15/2023   Chief Complaint/Reason for visit: recheck cellulitis   Current Therapy: Rande Lawman and alimta  Last treatment:  Day 1   Cycle 6 on 01/03/23   ASSESSMENT & PLAN: Patient is a 57 y.o. female with oncologic history of  stage IV non-small cell lung cancer, adenocarcinoma followed by Dr. Arbutus Palmer.  I have viewed most recent oncology note and lab work.    #Stage IV non-small cell lung cancer, adenocarcinoma  - Next appointment with oncologist is 01/23/23   #Recheck cellulitis -PE show improvement. Pain also improved. Will have patient continue Keflex and MD can evaluate at next visit. -If cellulitis does not resolve after antibiotics then question if this is effective treatment.   Strict ED precautions discussed should symptoms worsen.     Heme/Onc History: Oncology History  Metastatic lung cancer (metastasis from lung to other site) [facility])  09/28/2022 Initial Diagnosis   Metastatic lung cancer (metastasis from lung to other site) [facility])   10/10/2022 -  Chemotherapy   Patient is on Treatment Plan : LUNG Carboplatin (5) + Pemetrexed (500) + Pembrolizumab (200) D1 q21d Induction x 4 cycles / Maintenance Pemetrexed (500) + Pembrolizumab (200) D1 q21d     Adenocarcinoma of left lung, stage 4 (HCC)  09/28/2022 Initial Diagnosis   Adenocarcinoma of left lung, stage 4 (HCC)   09/28/2022 Cancer Staging   Staging form: Lung, AJCC 8th Edition - Clinical: Stage IVB (cT2a, cN3, cM1c) - Signed by Madison Gaul, MD on 09/28/2022       Interval history-: Madison Palmer is a 57 y.o. female with oncologic history as above presenting to [facility] today with chief complaint of recheck cellulitis.   Patient  presented to [facility] clinic x 4 days ago for new right leg pain. She was started on keflex to cover for possible cellulitis. Patient states since starting the keflex the redness seems to be improving. It has not extended past the area outlined at last visit.  She reports that throbbing pain has improved as well.  She now only has pain if touching her leg.  Pain is mild at 5/10 per patient.  She has been taking Tylenol as needed which she thinks is helping.  She has not had any fevers at home.    ROS  All other systems are reviewed and are negative for acute change except as noted in the HPI.    Allergies  Allergen Reactions   Other Other (See Comments)    Hydralazine Hcl Other reaction(s): Other (See Comments) Instructed not to take by Cardiology.   Fentanyl Hives    Other reaction(s): hives had fentanyl recently with benadryl, did OK   Levofloxacin Hives   Midazolam Hives   Pollen Extract     seasonal   Atorvastatin Other (See Comments)    Muscle pain in legs  Abdominal pain   Hydralazine Hcl Other (See Comments)    Hypercalcemia    Rosuvastatin Other (See Comments)    Abdominal pain Other reaction(s): diarrhea     Past Medical History:  Diagnosis Date   Acute on chronic respiratory failure with hypoxia and hypercapnia (HCC) 09/15/2018   Anemia    Anxiety    Arrhythmia  tachycardia   Arthritis    Bipolar 1 disorder (HCC) 09/12/2018   Bipolar I disorder, current or most recent episode manic, with psychotic features (HCC)    Brief psychotic disorder (HCC) 08/22/2018   Chronic respiratory failure with hypoxia (HCC) 05/10/2018   Formatting of this note might be different from the original. Last Assessment & Plan:  Continue 1 L continuous on exertion and during sleep. On her next visit we will check OSA on CPAP/room air to decide whether she needs to take oxygen along with her on her cruise in May   Depression    Diverticulitis    Generalized edema 03/12/2018   GERD  (gastroesophageal reflux disease)    Glaucoma    Hyperlipidemia    Hyperparathyroidism (HCC)    Hypertension    Inflammatory polyps of colon (HCC)    Leg swelling 01/14/2019   LVH (left ventricular hypertrophy)    Lymphedema    Morbid (severe) obesity due to excess calories (HCC) 05/11/2017   PCOS (polycystic ovarian syndrome)    Prediabetes    Recurrent UTI    Sleep apnea    CPAP   Thyroid disease    Vitamin D deficiency      Past Surgical History:  Procedure Laterality Date   BIOPSY  09/27/2022   Procedure: BIOPSY;  Surgeon: Madison Palmer., MD;  Location: [facility] ENDOSCOPY;  Service: Gastroenterology;;   BREAST BIOPSY  2015   CESAREAN SECTION  2004   CHOLECYSTECTOMY     COLONOSCOPY WITH PROPOFOL N/A 09/27/2022   Procedure: COLONOSCOPY WITH PROPOFOL;  Surgeon: Madison Palmer., MD;  Location: [facility] ENDOSCOPY;  Service: Gastroenterology;  Laterality: N/A;   ESOPHAGOGASTRODUODENOSCOPY (EGD) WITH PROPOFOL N/A 09/27/2022   Procedure: ESOPHAGOGASTRODUODENOSCOPY (EGD) WITH PROPOFOL;  Surgeon: Meridee Score Netty Starring., MD;  Location: [facility] ENDOSCOPY;  Service: Gastroenterology;  Laterality: N/A;   INNER EAR SURGERY     ear and sinus surgery   LAPAROSCOPIC REPAIR AND REMOVAL OF GASTRIC BAND     MALONEY DILATION  09/27/2022   Procedure: MALONEY DILATION;  Surgeon: Madison Palmer., MD;  Location: [facility] ENDOSCOPY;  Service: Gastroenterology;;   OOPHORECTOMY Left    POLYPECTOMY  09/27/2022   Procedure: POLYPECTOMY;  Surgeon: Madison Palmer., MD;  Location: [facility] ENDOSCOPY;  Service: Gastroenterology;;   TONSILLECTOMY AND ADENOIDECTOMY      Social History   Socioeconomic History   Marital status: Married    Spouse name: Not on file   Number of children: 1   Years of education: Not on file   Highest education level: Not on file  Occupational History   Occupation: Unemployed  Tobacco Use   Smoking status: Never   Smokeless tobacco: Never  Vaping Use   Vaping status:  Never Used  Substance and Sexual Activity   Alcohol use: Yes    Comment: social   Drug use: No   Sexual activity: Not Currently  Other Topics Concern   Not on file  Social History Narrative   Lives with son    Currently separated from spouse   Left handed   Caffeine: coffee daily: half-caff 3 cups/day and decaf maybe 3-5 cups/day, occasional mtn dew, tea 1-2 times per week   Social Determinants of Health   Financial Resource Strain: Low Risk  (07/03/2022)   Overall Financial Resource Strain (CARDIA)    Difficulty of Paying Living Expenses: Not hard at all  Food Insecurity: No Food Insecurity (07/03/2022)   Hunger Vital Sign    Worried About Running Out  of Food in the Last Year: Never true    Ran Out of Food in the Last Year: Never true  Transportation Needs: No Transportation Needs (07/03/2022)   PRAPARE - Administrator, Civil Service (Medical): No    Lack of Transportation (Non-Medical): No  Physical Activity: Inactive (07/03/2022)   Exercise Vital Sign    Days of Exercise per Week: 0 days    Minutes of Exercise per Session: 0 min  Stress: No Stress Concern Present (07/03/2022)   [employer] of Occupational Health - Occupational Stress Questionnaire    Feeling of Stress : Not at all  Social Connections: Unknown (11/11/2018)   Received from [facility], [facility]   Social Connection and Isolation Panel [NHANES]    Frequency of Communication with Friends and Family: Patient declined    Frequency of Social Gatherings with Friends and Family: Patient declined    Attends Religious Services: Patient declined    Database administrator or Organizations: Patient declined    Attends Banker Meetings: Patient declined    Marital Status: Patient declined  Intimate Partner Violence: Unknown (08/02/2021)   Received from [employer], [facility]   HITS    Physically Hurt: Not on file    Insult or Talk Down To: Not on file     Threaten Physical Harm: Not on file    Scream or Curse: Not on file    Family History  Adopted: Yes  Problem Relation Age of Onset   Hearing loss Son        right    Healthy Son    Breast cancer Neg Hx      Current Outpatient Medications:    acetaminophen (TYLENOL) 500 MG tablet, Take 1,000 mg by mouth 3 (three) times daily as needed for moderate pain or headache., Disp: , Rfl:    amLODipine (NORVASC) 10 MG tablet, TAKE 1 TABLET BY MOUTH EVERY DAY, Disp: 90 tablet, Rfl: 3   atorvastatin (LIPITOR) 20 MG tablet, TAKE 1 TABLET(20 MG) BY MOUTH 3 TIMES A WEEK, Disp: 12 tablet, Rfl: 5   benztropine (COGENTIN) 0.5 MG tablet, Take 1 tablet (0.5 mg total) by mouth at bedtime., Disp: 30 tablet, Rfl: 2   cephALEXin (KEFLEX) 500 MG capsule, Take 1 capsule (500 mg total) by mouth every 6 (six) hours for 7 days., Disp: 28 capsule, Rfl: 0   cetirizine (ZYRTEC) 10 MG tablet, TAKE 1 TABLET BY MOUTH EVERY DAY, Disp: 90 tablet, Rfl: 1   Cholecalciferol (VITAMIN D3 MAXIMUM STRENGTH) 125 MCG (5000 UT) capsule, Take 5,000 Units by mouth daily., Disp: , Rfl:    cinacalcet (SENSIPAR) 30 MG tablet, Take 30 mg by mouth daily., Disp: , Rfl:    cloNIDine (CATAPRES) 0.1 MG tablet, TAKE 1 TABLET(0.1 MG) BY MOUTH THREE TIMES DAILY, Disp: 90 tablet, Rfl: 3   esomeprazole (NEXIUM) 40 MG capsule, TAKE 1 CAPSULE BY MOUTH TWICE DAILY, Disp: 180 capsule, Rfl: 2   fenofibrate (TRICOR) 145 MG tablet, TAKE 1 TABLET(145 MG) BY MOUTH DAILY, Disp: 90 tablet, Rfl: 3   folic acid (FOLVITE) 1 MG tablet, TAKE 1 TABLET BY MOUTH DAILY, Disp: 30 tablet, Rfl: 2   furosemide (LASIX) 40 MG tablet, TAKE 1 TABLET BY MOUTH EVERY DAY, Disp: 90 tablet, Rfl: 3   gabapentin (NEURONTIN) 100 MG capsule, TAKE 2 CAPSULES(200 MG) BY MOUTH AT BEDTIME, Disp: 180 capsule, Rfl: 3   labetalol (NORMODYNE) 300 MG tablet, TAKE 1.5 TABLETS BY MOUTH  TWICE DAILY, Disp: 270 tablet, Rfl: 3   levothyroxine (SYNTHROID) 88 MCG tablet, Take 88 mcg by mouth daily.,  Disp: , Rfl:    lisinopril (ZESTRIL) 40 MG tablet, TAKE 1 TABLET BY MOUTH EVERY DAY, Disp: 90 tablet, Rfl: 1   lurasidone (LATUDA) 40 MG TABS tablet, Take 1 tablet (40 mg total) by mouth daily with breakfast., Disp: 30 tablet, Rfl: 2   oxyCODONE (OXY IR/ROXICODONE) 5 MG immediate release tablet, 1 tablet 30 minutes before the MRI.  May repeat once if needed. (Patient not taking: Reported on 01/09/2023), Disp: 10 tablet, Rfl: 0   Polyethylene Glycol 3350 (MIRALAX PO), Take 1 Capful by mouth daily as needed (Constipation)., Disp: , Rfl:    Potassium Chloride ER 20 MEQ TBCR, TAKE 1 TABLET BY MOUTH EVERY DAY, Disp: 90 tablet, Rfl: 2   prochlorperazine (COMPAZINE) 10 MG tablet, Take 1 tablet (10 mg total) by mouth every 6 (six) hours as needed., Disp: 30 tablet, Rfl: 2   sucralfate (CARAFATE) 1 g tablet, Take 1 tablet (1 g total) by mouth 3 (three) times daily before meals., Disp: 90 tablet, Rfl: 3   TART CHERRY PO, Take 1 tablet by mouth daily., Disp: , Rfl:   PHYSICAL EXAM: ECOG FS:1 - Symptomatic but completely ambulatory    Vitals:   01/15/23 1521  BP: 129/76  Pulse: 81  Resp: 16  Temp: 98.3 F (36.8 C)  TempSrc: Oral  SpO2: 94%   Physical Exam Vitals and nursing note reviewed.  Constitutional:      Appearance: She is not ill-appearing or toxic-appearing.  HENT:     Head: Normocephalic.  Eyes:     Conjunctiva/sclera: Conjunctivae normal.  Cardiovascular:     Rate and Rhythm: Normal rate.  Pulmonary:     Effort: Pulmonary effort is normal.  Abdominal:     General: There is no distension.  Musculoskeletal:     Cervical back: Normal range of motion.     Comments: Ambulatory with normal gait  Skin:    General: Skin is warm and dry.     Comments: Faint erythema on right shin and medial aspect of right thigh. No warmth or wounds  Neurological:     Mental Status: She is alert.        LABORATORY DATA: I have reviewed the data as listed    Latest Ref Rng & Units 01/03/2023     8:52 AM 12/26/2022    1:37 PM 12/19/2022    1:40 PM  CBC  WBC 4.0 - 10.5 K/uL 3.5  4.6  1.5   Hemoglobin 12.0 - 15.0 g/dL 95.6  21.3  08.6   Hematocrit 36.0 - 46.0 % 30.0  31.5  31.1   Platelets 150 - 400 K/uL 240  110  190         Latest Ref Rng & Units 01/03/2023    8:52 AM 12/26/2022    1:37 PM 12/19/2022    1:40 PM  CMP  Glucose 70 - 99 mg/dL 578  99  469   BUN 6 - 20 mg/dL 14  8  13    Creatinine 0.44 - 1.00 mg/dL 6.29  5.28  4.13   Sodium 135 - 145 mmol/L 141  143  140   Potassium 3.5 - 5.1 mmol/L 3.7  4.1  4.1   Chloride 98 - 111 mmol/L 104  105  103   CO2 22 - 32 mmol/L 29  31  31    Calcium 8.9 - 10.3 mg/dL 9.4  9.2  9.5   Total Protein 6.5 - 8.1 g/dL 6.7  6.7  7.0   Total Bilirubin 0.3 - 1.2 mg/dL 0.6  0.4  0.7   Alkaline Phos 38 - 126 U/L 61  78  70   AST 15 - 41 U/L 19  31  32   ALT 0 - 44 U/L 15  37  32        RADIOGRAPHIC STUDIES (from last 24 hours if applicable) I have personally reviewed the radiological images as listed and agreed with the findings in the report. No results found.      Visit Diagnosis: 1. Acute leg pain, right   2. Adenocarcinoma of left lung, stage 4 (HCC)      No orders of the defined types were placed in this encounter.   All questions were answered. The patient knows to call the clinic with any problems, questions or concerns. No barriers to learning was detected.  A total of more than 10 minutes were spent on this encounter with face-to-face time and non-face-to-face time, including preparing to see the patient, ordering tests and/or medications, counseling the patient and coordination of care as outlined above.    Thank you for allowing me to participate in the care of this patient.    Shanon Ace, PA-C Department of Hematology/Oncology [facility] at [facility] Phone: [phone]  [phone]    01/15/2023 4:40 PM

## 2023-01-16 ENCOUNTER — Other Ambulatory Visit: Payer: BC Managed Care – PPO

## 2023-01-23 ENCOUNTER — Inpatient Hospital Stay: Payer: BC Managed Care – PPO

## 2023-01-23 ENCOUNTER — Encounter: Payer: Self-pay | Admitting: Internal Medicine

## 2023-01-23 ENCOUNTER — Inpatient Hospital Stay (HOSPITAL_BASED_OUTPATIENT_CLINIC_OR_DEPARTMENT_OTHER): Payer: BC Managed Care – PPO | Admitting: Internal Medicine

## 2023-01-23 VITALS — BP 125/73 | HR 62

## 2023-01-23 VITALS — BP 120/72 | HR 74 | Temp 98.2°F | Resp 19 | Ht 62.0 in | Wt 279.6 lb

## 2023-01-23 DIAGNOSIS — Z5112 Encounter for antineoplastic immunotherapy: Secondary | ICD-10-CM | POA: Diagnosis not present

## 2023-01-23 DIAGNOSIS — C3490 Malignant neoplasm of unspecified part of unspecified bronchus or lung: Secondary | ICD-10-CM

## 2023-01-23 DIAGNOSIS — C3492 Malignant neoplasm of unspecified part of left bronchus or lung: Secondary | ICD-10-CM

## 2023-01-23 LAB — CMP (CANCER CENTER ONLY)
ALT: 13 U/L (ref 0–44)
AST: 19 U/L (ref 15–41)
Albumin: 3.7 g/dL (ref 3.5–5.0)
Alkaline Phosphatase: 65 U/L (ref 38–126)
Anion gap: 7 (ref 5–15)
BUN: 9 mg/dL (ref 6–20)
CO2: 31 mmol/L (ref 22–32)
Calcium: 9.2 mg/dL (ref 8.9–10.3)
Chloride: 103 mmol/L (ref 98–111)
Creatinine: 1.11 mg/dL — ABNORMAL HIGH (ref 0.44–1.00)
GFR, Estimated: 58 mL/min — ABNORMAL LOW (ref >60)
Glucose, Bld: 118 mg/dL — ABNORMAL HIGH (ref 70–99)
Potassium: 3.8 mmol/L (ref 3.5–5.1)
Sodium: 141 mmol/L (ref 135–145)
Total Bilirubin: 0.5 mg/dL (ref 0.3–1.2)
Total Protein: 6.8 g/dL (ref 6.5–8.1)

## 2023-01-23 LAB — CBC WITH DIFFERENTIAL (CANCER CENTER ONLY)
Abs Immature Granulocytes: 0.02 10*3/uL (ref 0.00–0.07)
Basophils Absolute: 0 10*3/uL (ref 0.0–0.1)
Basophils Relative: 0 %
Eosinophils Absolute: 0.1 10*3/uL (ref 0.0–0.5)
Eosinophils Relative: 1 %
HCT: 30.7 % — ABNORMAL LOW (ref 36.0–46.0)
Hemoglobin: 10.2 g/dL — ABNORMAL LOW (ref 12.0–15.0)
Immature Granulocytes: 0 %
Lymphocytes Relative: 24 %
Lymphs Abs: 1.2 10*3/uL (ref 0.7–4.0)
MCH: 32.3 pg (ref 26.0–34.0)
MCHC: 33.2 g/dL (ref 30.0–36.0)
MCV: 97.2 fL (ref 80.0–100.0)
Monocytes Absolute: 0.5 10*3/uL (ref 0.1–1.0)
Monocytes Relative: 9 %
Neutro Abs: 3.3 10*3/uL (ref 1.7–7.7)
Neutrophils Relative %: 66 %
Platelet Count: 350 10*3/uL (ref 150–400)
RBC: 3.16 MIL/uL — ABNORMAL LOW (ref 3.87–5.11)
RDW: 21.3 % — ABNORMAL HIGH (ref 11.5–15.5)
WBC Count: 5.1 10*3/uL (ref 4.0–10.5)
nRBC: 0 % (ref 0.0–0.2)

## 2023-01-23 MED ORDER — SODIUM CHLORIDE 0.9 % IV SOLN: Freq: Once | INTRAVENOUS | Status: AC

## 2023-01-23 MED ORDER — PROCHLORPERAZINE MALEATE 10 MG PO TABS
10.0000 mg | ORAL_TABLET | Freq: Once | ORAL | Status: AC
  Administered 2023-01-23: 10 mg via ORAL
  Filled 2023-01-23: qty 1

## 2023-01-23 MED ORDER — DOXYCYCLINE HYCLATE 100 MG PO TABS: 100.0000 mg | ORAL_TABLET | Freq: Two times a day (BID) | ORAL | 0 refills | Status: DC

## 2023-01-23 MED ORDER — SODIUM CHLORIDE 0.9 % IV SOLN
200.0000 mg | Freq: Once | INTRAVENOUS | Status: AC
  Administered 2023-01-23: 200 mg via INTRAVENOUS
  Filled 2023-01-23: qty 200

## 2023-01-23 MED ORDER — SODIUM CHLORIDE 0.9 % IV SOLN
500.0000 mg/m2 | Freq: Once | INTRAVENOUS | Status: AC
  Administered 2023-01-23: 1200 mg via INTRAVENOUS
  Filled 2023-01-23: qty 40

## 2023-01-23 NOTE — Progress Notes (Signed)
[facility] [phone]   [phone]  OFFICE PROGRESS NOTE  [name], [name], NP [address] [place] [address]   DIAGNOSIS: Stage IV non-small cell lung cancer, adenocarcinoma (T2a, N3, M1c).  She presented with bilateral lung nodules, thoracic adenopathy and bilobar liver lesions.  She was diagnosed in May 2024.     PDL1: 0   Guardant 360: Negative for any actionable mutations   Foundation One: No actionable mutations    PRIOR THERAPY: None   CURRENT THERAPY: Palliative systemic chemotherapy and immunotherapy with carboplatin for an AUC of 5, alimta 500 mg/m2, and Keytruda 200 mg IV every 3 weeks. First dose on 10/10/22. Status post 5 cycles.  Starting from cycle #5 the patient is on maintenance treatment with Alimta and Keytruda every 3 weeks  INTERVAL HISTORY: Madison Palmer 57 y.o. female returns to clinic today for follow-up visit accompanied by her son.Discussed the use of AI scribe software for clinical note transcription with the patient, who gave verbal consent to proceed. History of Present Illness   The patient, initially seen in May 2024, has been undergoing treatment for a Stage IV non-small cell lung cancer, adenocarcinoma (T2a, N3, M1c).  She presented with bilateral lung nodules, thoracic adenopathy and bilobar liver lesions.  She was diagnosed in May 2024.  She was initially treated with a regimen of Carboplatin, Alimta, and Keytruda for four cycles, and has since transitioned to maintenance therapy with Alimta and Keytruda. She has completed one round of this maintenance therapy and is due for the second round.  The patient reports a persistent redness and swelling in her right leg, which has been present for at least three weeks. The redness was initially intermittent but has become constant. The swelling is associated with pain, particularly on touch. The patient also notes a lump-like sensation in the area,  which feels tight. She has a history of lymphedema, which is typically worse on the left side. A Doppler ultrasound of the lower extremity was performed approximately a week and a half ago, which did not reveal any blood clots. The patient completed a seven-day course of Keflex without any noticeable improvement in the redness or swelling.  The patient also reports an increase in coughing, which worsens at night. She is unsure of the cause. Additionally, she experiences a sensation of burning and pins and needles in the left hip area, particularly upon waking and getting out of bed. This sensation improves as she starts moving around during the day. She occasionally takes Tylenol for relief.  The patient also experienced some diarrhea towards the end of the previous week, which coincided with her antibiotic course. However, she did not perceive the antibiotics to be the cause as she did not experience any stomach discomfort while taking them. The patient also reports feeling slower and more fatigued than usual, particularly after work.       MEDICAL HISTORY: Past Medical History:  Diagnosis Date   Acute on chronic respiratory failure with hypoxia and hypercapnia (HCC) 09/15/2018   Anemia    Anxiety    Arrhythmia    tachycardia   Arthritis    Bipolar 1 disorder (HCC) 09/12/2018   Bipolar I disorder, current or most recent episode manic, with psychotic features (HCC)    Brief psychotic disorder (HCC) 08/22/2018   Chronic respiratory failure with hypoxia (HCC) 05/10/2018   Formatting of this note might be different from the original. Last Assessment & Plan:  Continue 1 L continuous on exertion and during sleep. On her next visit we will check OSA on CPAP/room air to decide whether she needs to take oxygen along with her on her cruise in May   Depression    Diverticulitis    Generalized edema 03/12/2018   GERD (gastroesophageal reflux disease)    Glaucoma    Hyperlipidemia     Hyperparathyroidism (HCC)    Hypertension    Inflammatory polyps of colon (HCC)    Leg swelling 01/14/2019   LVH (left ventricular hypertrophy)    Lymphedema    Morbid (severe) obesity due to excess calories (HCC) 05/11/2017   PCOS (polycystic ovarian syndrome)    Prediabetes    Recurrent UTI    Sleep apnea    CPAP   Thyroid disease    Vitamin D deficiency     ALLERGIES:  is allergic to other, fentanyl, levofloxacin, midazolam, pollen extract, atorvastatin, hydralazine hcl, and rosuvastatin.  MEDICATIONS:  Current Outpatient Medications  Medication Sig Dispense Refill   acetaminophen (TYLENOL) 500 MG tablet Take 1,000 mg by mouth 3 (three) times daily as needed for moderate pain or headache.     amLODipine (NORVASC) 10 MG tablet TAKE 1 TABLET BY MOUTH EVERY DAY 90 tablet 3   atorvastatin (LIPITOR) 20 MG tablet TAKE 1 TABLET(20 MG) BY MOUTH 3 TIMES A WEEK 12 tablet 5   benztropine (COGENTIN) 0.5 MG tablet Take 1 tablet (0.5 mg total) by mouth at bedtime. 30 tablet 2   cetirizine (ZYRTEC) 10 MG tablet TAKE 1 TABLET BY MOUTH EVERY DAY 90 tablet 1   Cholecalciferol (VITAMIN D3 MAXIMUM STRENGTH) 125 MCG (5000 UT) capsule Take 5,000 Units by mouth daily.     cinacalcet (SENSIPAR) 30 MG tablet Take 30 mg by mouth daily.     cloNIDine (CATAPRES) 0.1 MG tablet TAKE 1 TABLET(0.1 MG) BY MOUTH THREE TIMES DAILY 90 tablet 3   esomeprazole (NEXIUM) 40 MG capsule TAKE 1 CAPSULE BY MOUTH TWICE DAILY 180 capsule 2   fenofibrate (TRICOR) 145 MG tablet TAKE 1 TABLET(145 MG) BY MOUTH DAILY 90 tablet 3   folic acid (FOLVITE) 1 MG tablet TAKE 1 TABLET BY MOUTH DAILY 30 tablet 2   furosemide (LASIX) 40 MG tablet TAKE 1 TABLET BY MOUTH EVERY DAY 90 tablet 3   gabapentin (NEURONTIN) 100 MG capsule TAKE 2 CAPSULES(200 MG) BY MOUTH AT BEDTIME 180 capsule 3   labetalol (NORMODYNE) 300 MG tablet TAKE 1.5 TABLETS BY MOUTH TWICE DAILY 270 tablet 3   levothyroxine (SYNTHROID) 88 MCG tablet Take 88 mcg by mouth  daily.     lisinopril (ZESTRIL) 40 MG tablet TAKE 1 TABLET BY MOUTH EVERY DAY 90 tablet 1   lurasidone (LATUDA) 40 MG TABS tablet Take 1 tablet (40 mg total) by mouth daily with breakfast. 30 tablet 2   oxyCODONE (OXY IR/ROXICODONE) 5 MG immediate release tablet 1 tablet 30 minutes before the MRI.  May repeat once if needed. (Patient not taking: Reported on 01/09/2023) 10 tablet 0   Polyethylene Glycol 3350 (MIRALAX PO) Take 1 Capful by mouth daily as needed (Constipation).     Potassium Chloride ER 20 MEQ TBCR TAKE 1 TABLET BY MOUTH EVERY DAY 90 tablet 2   prochlorperazine (COMPAZINE) 10 MG tablet Take 1 tablet (10 mg total) by mouth every 6 (six) hours as needed. 30 tablet 2   sucralfate (CARAFATE) 1 g tablet Take 1 tablet (1 g total) by mouth 3 (three) times daily before meals. 90 tablet  3   TART CHERRY PO Take 1 tablet by mouth daily.     No current facility-administered medications for this visit.    SURGICAL HISTORY:  Past Surgical History:  Procedure Laterality Date   BIOPSY  09/27/2022   Procedure: BIOPSY;  Surgeon: Mansouraty, Netty Starring., MD;  Location: [facility] ENDOSCOPY;  Service: Gastroenterology;;   BREAST BIOPSY  2015   CESAREAN SECTION  2004   CHOLECYSTECTOMY     COLONOSCOPY WITH PROPOFOL N/A 09/27/2022   Procedure: COLONOSCOPY WITH PROPOFOL;  Surgeon: Lemar Lofty., MD;  Location: [facility] ENDOSCOPY;  Service: Gastroenterology;  Laterality: N/A;   ESOPHAGOGASTRODUODENOSCOPY (EGD) WITH PROPOFOL N/A 09/27/2022   Procedure: ESOPHAGOGASTRODUODENOSCOPY (EGD) WITH PROPOFOL;  Surgeon: Meridee Score Netty Starring., MD;  Location: [facility] ENDOSCOPY;  Service: Gastroenterology;  Laterality: N/A;   INNER EAR SURGERY     ear and sinus surgery   LAPAROSCOPIC REPAIR AND REMOVAL OF GASTRIC BAND     MALONEY DILATION  09/27/2022   Procedure: MALONEY DILATION;  Surgeon: Lemar Lofty., MD;  Location: [facility] ENDOSCOPY;  Service: Gastroenterology;;   OOPHORECTOMY Left    POLYPECTOMY  09/27/2022    Procedure: POLYPECTOMY;  Surgeon: Lemar Lofty., MD;  Location: [facility] ENDOSCOPY;  Service: Gastroenterology;;   TONSILLECTOMY AND ADENOIDECTOMY      REVIEW OF SYSTEMS:  Constitutional: positive for fatigue Eyes: negative Ears, nose, mouth, throat, and face: negative Respiratory: positive for cough Cardiovascular: negative Gastrointestinal: positive for diarrhea Genitourinary:negative Integument/breast: negative Hematologic/lymphatic: negative Musculoskeletal:positive for arthralgias Neurological: negative Behavioral/Psych: negative Endocrine: negative Allergic/Immunologic: negative   PHYSICAL EXAMINATION: General appearance: alert, cooperative, fatigued, and no distress Head: Normocephalic, without obvious abnormality, atraumatic Neck: no adenopathy, no JVD, supple, symmetrical, trachea midline, and thyroid not enlarged, symmetric, no tenderness/mass/nodules Lymph nodes: Cervical, supraclavicular, and axillary nodes normal. Resp: clear to auscultation bilaterally Back: symmetric, no curvature. ROM normal. No CVA tenderness. Cardio: regular rate and rhythm, S1, S2 normal, no murmur, click, rub or gallop GI: soft, non-tender; bowel sounds normal; no masses,  no organomegaly Extremities: edema 1+ and small area of erythema in the right leg. Neurologic: Alert and oriented X 3, normal strength and tone. Normal symmetric reflexes. Normal coordination and gait  ECOG PERFORMANCE STATUS: 1 - Symptomatic but completely ambulatory  Blood pressure 120/72, pulse 74, temperature 98.2 F (36.8 C), resp. rate 19, height 5\' 2"  (1.575 m), weight 279 lb 9 oz (126.8 kg), last menstrual period 05/02/2017, SpO2 94%.  LABORATORY DATA: Lab Results  Component Value Date   WBC 5.1 01/23/2023   HGB 10.2 (L) 01/23/2023   HCT 30.7 (L) 01/23/2023   MCV 97.2 01/23/2023   PLT 350 01/23/2023      Chemistry      Component Value Date/Time   NA 141 01/03/2023 0852   NA 145 09/08/2021 0000   K  3.7 01/03/2023 0852   CL 104 01/03/2023 0852   CO2 29 01/03/2023 0852   BUN 14 01/03/2023 0852   BUN 36 (H) 08/22/2021 1606   CREATININE 1.11 (H) 01/03/2023 0852   CREATININE 1.13 (H) 12/24/2018 1402   GLU 97 09/08/2021 0000      Component Value Date/Time   CALCIUM 9.4 01/03/2023 0852   ALKPHOS 61 01/03/2023 0852   AST 19 01/03/2023 0852   ALT 15 01/03/2023 0852   BILITOT 0.6 01/03/2023 0852       RADIOGRAPHIC STUDIES: VAS [place] LOWER EXTREMITY VENOUS (DVT)  Result Date: 01/11/2023  Lower Venous DVT Study Patient Name:  EZELL AYER Wetherbee  Date of  Exam:   01/11/2023 Medical Rec #: [id number]        Accession #:    [id number] Date of Birth: 1965-11-05        Patient Gender: F Patient Age:   57 years Exam Location:  [facility] Procedure:      VAS [place] LOWER EXTREMITY VENOUS (DVT) Referring Phys: [facility] Verna Desrocher --------------------------------------------------------------------------------  Indications: Pain and erythema- anterior aspect of right calf, mild swelling to left calf.  Risk Factors: Cancer - lung, on chemo. Limitations: Body habitus. Comparison Study: No recent prior studies. Performing Technologist: Jean Rosenthal RDMS, RVT  Examination Guidelines: A complete evaluation includes B-mode imaging, spectral Doppler, color Doppler, and power Doppler as needed of all accessible portions of each vessel. Bilateral testing is considered an integral part of a complete examination. Limited examinations for reoccurring indications may be performed as noted. The reflux portion of the exam is performed with the patient in reverse Trendelenburg.  +---------+---------------+---------+-----------+----------+--------------+ RIGHT    CompressibilityPhasicitySpontaneityPropertiesThrombus Aging +---------+---------------+---------+-----------+----------+--------------+ CFV      Full           Yes      Yes                                  +---------+---------------+---------+-----------+----------+--------------+ SFJ      Full                                                        +---------+---------------+---------+-----------+----------+--------------+ FV Prox  Full                                                        +---------+---------------+---------+-----------+----------+--------------+ FV Mid   Full                                                        +---------+---------------+---------+-----------+----------+--------------+ FV DistalFull                                                        +---------+---------------+---------+-----------+----------+--------------+ PFV      Full                                                        +---------+---------------+---------+-----------+----------+--------------+ POP      Full           Yes      Yes                                 +---------+---------------+---------+-----------+----------+--------------+ PTV      Full                                                        +---------+---------------+---------+-----------+----------+--------------+  PERO     Full                                                        +---------+---------------+---------+-----------+----------+--------------+ Gastroc  Full                                                        +---------+---------------+---------+-----------+----------+--------------+   +---------+---------------+---------+-----------+----------+--------------+ LEFT     CompressibilityPhasicitySpontaneityPropertiesThrombus Aging +---------+---------------+---------+-----------+----------+--------------+ CFV      Full           Yes      Yes                                 +---------+---------------+---------+-----------+----------+--------------+ SFJ      Full                                                         +---------+---------------+---------+-----------+----------+--------------+ FV Prox  Full                                                        +---------+---------------+---------+-----------+----------+--------------+ FV Mid   Full                                                        +---------+---------------+---------+-----------+----------+--------------+ FV DistalFull                                                        +---------+---------------+---------+-----------+----------+--------------+ PFV      Full                                                        +---------+---------------+---------+-----------+----------+--------------+ POP      Full           Yes      Yes                                 +---------+---------------+---------+-----------+----------+--------------+ PTV      Full                                                        +---------+---------------+---------+-----------+----------+--------------+  PERO     Full                                                        +---------+---------------+---------+-----------+----------+--------------+     Summary: RIGHT: - There is no evidence of deep vein thrombosis in the lower extremity.  - No cystic structure found in the popliteal fossa. - Trace fluid noted along the anterior aspect of the right calf at patient area of erythema/pain. Non-vascular.  LEFT: - There is no evidence of deep vein thrombosis in the lower extremity.  - No cystic structure found in the popliteal fossa.  *See table(s) above for measurements and observations. Electronically signed by Heath Lark on 01/11/2023 at 10:48:38 PM.    Final     ASSESSMENT AND PLAN: This is a very pleasant 57 years old African-American female with stage IV non-small cell lung cancer, adenocarcinoma (T2a, N3, M1c).  She presented with bilateral lung nodules, thoracic adenopathy and bilobar liver lesions.  She was diagnosed in May 2024.    She has no actionable mutations and PD-L1 expression was negative. The patient is currently undergoing systemic chemoimmunotherapy with carboplatin for AUC 5, Alimta 500 Mg/M2 and Keytruda 200 Mg IV every 3 weeks status post 5 cycles.  Starting from cycle #5 she is on maintenance treatment with Alimta and Keytruda every 3 weeks.  She has been tolerating this treatment fairly well. Assessment and Plan    Metastatic Lung Cancer On maintenance chemotherapy with Alimta and Keytruda. Reports feeling slower and more fatigued. -Continue current chemotherapy regimen. -Schedule imaging in 2 weeks to assess response to treatment. -Follow-up in 3 weeks to review scan results.   Right leg swelling and redness Possible cellulitis, but no improvement after 7 days of Keflex. No fever or chills. Doppler of lower extremity showed no blood clots. -Start Doxycycline 100mg  twice daily for 1 week.  Cough Increased cough, especially at night. No other respiratory symptoms mentioned. -Monitor symptoms.  Left hip area discomfort Morning discomfort in left hip area, improves with movement and Tylenol. -Continue Tylenol as needed.   She was advised to call immediately if she has any other concerning symptoms in the interval. The patient voices understanding of current disease status and treatment options and is in agreement with the current care plan.  All questions were answered. The patient knows to call the clinic with any problems, questions or concerns. We can certainly see the patient much sooner if necessary.  The total time spent in the appointment was 30 minutes.  Disclaimer: This note was dictated with voice recognition software. Similar sounding words can inadvertently be transcribed and may not be corrected upon review.

## 2023-01-23 NOTE — Patient Instructions (Signed)
[place] [place] [facility]  Discharge Instructions: Thank you for choosing [facility] to provide your oncology and hematology care.   If you have a lab appointment with the Cancer Center, please go directly to the Cancer Center and check in at the registration area.   Wear comfortable clothing and clothing appropriate for easy access to any Portacath or PICC line.   We strive to give you quality time with your provider. You may need to reschedule your appointment if you arrive late (15 or more minutes).  Arriving late affects you and other patients whose appointments are after yours.  Also, if you miss three or more appointments without notifying the office, you may be dismissed from the clinic at the provider's discretion.      For prescription refill requests, have your pharmacy contact our office and allow 72 hours for refills to be completed.    Today you received the following chemotherapy and/or immunotherapy agents: Keytruda, Alimta      To help prevent nausea and vomiting after your treatment, we encourage you to take your nausea medication as directed.  BELOW ARE SYMPTOMS THAT SHOULD BE REPORTED IMMEDIATELY: *FEVER GREATER THAN 100.4 F (38 C) OR HIGHER *CHILLS OR SWEATING *NAUSEA AND VOMITING THAT IS NOT CONTROLLED WITH YOUR NAUSEA MEDICATION *UNUSUAL SHORTNESS OF BREATH *UNUSUAL BRUISING OR BLEEDING *URINARY PROBLEMS (pain or burning when urinating, or frequent urination) *BOWEL PROBLEMS (unusual diarrhea, constipation, pain near the anus) TENDERNESS IN MOUTH AND THROAT WITH OR WITHOUT PRESENCE OF ULCERS (sore throat, sores in mouth, or a toothache) UNUSUAL RASH, SWELLING OR PAIN  UNUSUAL VAGINAL DISCHARGE OR ITCHING   Items with * indicate a potential emergency and should be followed up as soon as possible or go to the Emergency Department if any problems should occur.  Please show the CHEMOTHERAPY ALERT CARD or IMMUNOTHERAPY ALERT CARD  at check-in to the Emergency Department and triage nurse.  Should you have questions after your visit or need to cancel or reschedule your appointment, please contact [place] [facility]  Dept: [phone]  and follow the prompts.  Office hours are 8:00 a.m. to 4:30 p.m. Monday - Friday. Please note that voicemails left after 4:00 p.m. may not be returned until the following business day.  We are closed weekends and major holidays. You have access to a nurse at all times for urgent questions. Please call the main number to the clinic Dept: [phone] and follow the prompts.   For any non-urgent questions, you may also contact your provider using MyChart. We now offer e-Visits for anyone 5 and older to request care online for non-urgent symptoms. For details visit [email or web address].   Also download the MyChart app! Go to the app store, search "MyChart", open the app, select [place], and log in with your MyChart username and password.

## 2023-01-30 ENCOUNTER — Other Ambulatory Visit: Payer: Self-pay | Admitting: Gastroenterology

## 2023-01-30 ENCOUNTER — Telehealth: Payer: Self-pay | Admitting: Medical Oncology

## 2023-01-30 NOTE — Telephone Encounter (Signed)
LVM to return my call °

## 2023-02-01 ENCOUNTER — Ambulatory Visit (INDEPENDENT_AMBULATORY_CARE_PROVIDER_SITE_OTHER)
Admission: RE | Admit: 2023-02-01 | Discharge: 2023-02-01 | Disposition: A | Discharge status: Home / Self Care | Payer: BC Managed Care – PPO | Source: Ambulatory Visit | Attending: Nurse Practitioner | Admitting: Nurse Practitioner

## 2023-02-01 ENCOUNTER — Encounter (INDEPENDENT_AMBULATORY_CARE_PROVIDER_SITE_OTHER): Payer: Self-pay | Admitting: Otolaryngology

## 2023-02-01 ENCOUNTER — Ambulatory Visit (INDEPENDENT_AMBULATORY_CARE_PROVIDER_SITE_OTHER): Payer: BC Managed Care – PPO | Admitting: Otolaryngology

## 2023-02-01 ENCOUNTER — Encounter: Payer: Self-pay | Admitting: Nurse Practitioner

## 2023-02-01 ENCOUNTER — Ambulatory Visit: Payer: BC Managed Care – PPO | Admitting: Nurse Practitioner

## 2023-02-01 VITALS — BP 120/60 | HR 88 | Temp 98.0°F | Wt 291.6 lb

## 2023-02-01 VITALS — Ht 64.0 in | Wt 291.0 lb

## 2023-02-01 DIAGNOSIS — L03115 Cellulitis of right lower limb: Secondary | ICD-10-CM

## 2023-02-01 DIAGNOSIS — R3911 Hesitancy of micturition: Secondary | ICD-10-CM

## 2023-02-01 DIAGNOSIS — H6122 Impacted cerumen, left ear: Secondary | ICD-10-CM | POA: Insufficient documentation

## 2023-02-01 DIAGNOSIS — E1169 Type 2 diabetes mellitus with other specified complication: Secondary | ICD-10-CM

## 2023-02-01 DIAGNOSIS — H9012 Conductive hearing loss, unilateral, left ear, with unrestricted hearing on the contralateral side: Secondary | ICD-10-CM

## 2023-02-01 DIAGNOSIS — H6983 Other specified disorders of Eustachian tube, bilateral: Secondary | ICD-10-CM

## 2023-02-01 DIAGNOSIS — D702 Other drug-induced agranulocytosis: Secondary | ICD-10-CM | POA: Diagnosis not present

## 2023-02-01 LAB — POCT GLYCOSYLATED HEMOGLOBIN (HGB A1C)
HbA1c POC (<> result, manual entry): 6 % (ref 4.0–5.6)
HbA1c, POC (controlled diabetic range): 6 % (ref 0.0–7.0)
HbA1c, POC (prediabetic range): 6 % (ref 5.7–6.4)
Hemoglobin A1C: 6 % — AB (ref 4.0–5.6)

## 2023-02-01 MED ORDER — DOXYCYCLINE HYCLATE 100 MG PO TABS
100.0000 mg | ORAL_TABLET | Freq: Two times a day (BID) | ORAL | 0 refills | Status: DC
Start: 2023-02-01 — End: 2023-02-08

## 2023-02-01 NOTE — Assessment & Plan Note (Signed)
resolved    Latest Ref Rng & Units 01/23/2023   10:49 AM 01/03/2023    8:52 AM 12/26/2022    1:37 PM  CBC  WBC 4.0 - 10.5 K/uL 5.1  3.5  4.6   Hemoglobin 12.0 - 15.0 g/dL 16.1  09.6  04.5   Hematocrit 36.0 - 46.0 % 30.7  30.0  31.5   Platelets 150 - 400 K/uL 350  240  110

## 2023-02-01 NOTE — Assessment & Plan Note (Signed)
Repeat hgbA1c: 6.0% Controlled glucose Advised to schedule DIABETES eye exam LDL at goal with fenofibrate and atorvastatin BP at goal

## 2023-02-01 NOTE — Assessment & Plan Note (Addendum)
Onset 3weeks ago, right LE redness, swelling and pain. Negative DVT per venous doppler 01/11/23 No improvement with keflex 500mg  QID x7days. This was switched to doxycycline 100mg  BID, but only tool 1tab daily because she forgot 2nd dose. Today she reports improved swelling but persistent redness and pain. Pain is worse with walking. CBC    Component Value Date/Time   WBC 5.1 01/23/2023 1049   WBC 6.1 09/20/2022 1123   RBC 3.16 (L) 01/23/2023 1049   HGB 10.2 (L) 01/23/2023 1049   HCT 30.7 (L) 01/23/2023 1049   PLT 350 01/23/2023 1049   MCV 97.2 01/23/2023 1049   MCH 32.3 01/23/2023 1049   MCHC 33.2 01/23/2023 1049   RDW 21.3 (H) 01/23/2023 1049   LYMPHSABS 1.2 01/23/2023 1049   MONOABS 0.5 01/23/2023 1049   EOSABS 0.1 01/23/2023 1049   BASOSABS 0.0 01/23/2023 1049    Lateral spread of Redness outside of marked borders Ordered x-ray of fib/tibia Advised to use smart phone for dose reminder and importance of dose compliance. Extended doxycycline 100mg  BID x 1week Add probiotic 1cap daily to prevent diarrhea F/up in 1week Refer to vascular if persistent redness and pain

## 2023-02-01 NOTE — Progress Notes (Addendum)
Established Patient Visit  Patient: Madison Palmer   DOB: 1965-05-22   57 y.o. Female  MRN: [id number] Visit Date: 02/01/2023  Subjective:    Chief Complaint  Patient presents with   Follow-up    3 month follow up. She has been having increased pain and swelling in her right leg.   HPI Accompanied by son-Jarrett Neutropenia, drug-induced (HCC) resolved    Latest Ref Rng & Units 01/23/2023   10:49 AM 01/03/2023    8:52 AM 12/26/2022    1:37 PM  CBC  WBC 4.0 - 10.5 K/uL 5.1  3.5  4.6   Hemoglobin 12.0 - 15.0 g/dL 40.9  81.1  91.4   Hematocrit 36.0 - 46.0 % 30.7  30.0  31.5   Platelets 150 - 400 K/uL 350  240  110      Cellulitis of right lower extremity Onset 3weeks ago, right LE redness, swelling and pain. Negative DVT per venous doppler 01/11/23 No improvement with keflex 500mg  QID x7days. This was switched to doxycycline 100mg  BID, but only tool 1tab daily because she forgot 2nd dose. Today she reports improved swelling but persistent redness and pain. Pain is worse with walking. CBC    Component Value Date/Time   WBC 5.1 01/23/2023 1049   WBC 6.1 09/20/2022 1123   RBC 3.16 (L) 01/23/2023 1049   HGB 10.2 (L) 01/23/2023 1049   HCT 30.7 (L) 01/23/2023 1049   PLT 350 01/23/2023 1049   MCV 97.2 01/23/2023 1049   MCH 32.3 01/23/2023 1049   MCHC 33.2 01/23/2023 1049   RDW 21.3 (H) 01/23/2023 1049   LYMPHSABS 1.2 01/23/2023 1049   MONOABS 0.5 01/23/2023 1049   EOSABS 0.1 01/23/2023 1049   BASOSABS 0.0 01/23/2023 1049    Lateral spread of Redness outside of marked borders Ordered x-ray of fib/tibia Advised to use smart phone for dose reminder and importance of dose compliance. Extended doxycycline 100mg  BID x 1week Add probiotic 1cap daily to prevent diarrhea F/up in 1week Refer to vascular if persistent redness and pain  DM (diabetes mellitus) (HCC) Repeat hgbA1c: 6.0% Controlled glucose Advised to schedule DIABETES eye exam LDL at goal with  fenofibrate and atorvastatin BP at goal  She has noticed change in urine stream and urinary hesitancy. No incontinence, no pelvic pain, no dysuria, no hematuria, no back pain.  BP Readings from Last 3 Encounters:  02/01/23 120/60  01/23/23 125/73  01/23/23 120/72    Wt Readings from Last 3 Encounters:  02/01/23 291 lb 9.6 oz (132.3 kg)  01/23/23 279 lb 9 oz (126.8 kg)  01/11/23 299 lb 4.8 oz (135.8 kg)    Reviewed medical, surgical, and social history today  Medications: Outpatient Medications Prior to Visit  Medication Sig   acetaminophen (TYLENOL) 500 MG tablet Take 1,000 mg by mouth 3 (three) times daily as needed for moderate pain or headache.   amLODipine (NORVASC) 10 MG tablet TAKE 1 TABLET BY MOUTH EVERY DAY   atorvastatin (LIPITOR) 20 MG tablet TAKE 1 TABLET(20 MG) BY MOUTH 3 TIMES A WEEK   benztropine (COGENTIN) 0.5 MG tablet Take 1 tablet (0.5 mg total) by mouth at bedtime.   cetirizine (ZYRTEC) 10 MG tablet TAKE 1 TABLET BY MOUTH EVERY DAY   Cholecalciferol (VITAMIN D3 MAXIMUM STRENGTH) 125 MCG (5000 UT) capsule Take 5,000 Units by mouth daily.   cinacalcet (SENSIPAR) 30 MG tablet Take 30 mg by mouth daily.  cloNIDine (CATAPRES) 0.1 MG tablet TAKE 1 TABLET(0.1 MG) BY MOUTH THREE TIMES DAILY   esomeprazole (NEXIUM) 40 MG capsule TAKE 1 CAPSULE BY MOUTH TWICE DAILY   fenofibrate (TRICOR) 145 MG tablet TAKE 1 TABLET(145 MG) BY MOUTH DAILY   folic acid (FOLVITE) 1 MG tablet TAKE 1 TABLET BY MOUTH DAILY   furosemide (LASIX) 40 MG tablet TAKE 1 TABLET BY MOUTH EVERY DAY   gabapentin (NEURONTIN) 100 MG capsule TAKE 2 CAPSULES(200 MG) BY MOUTH AT BEDTIME   labetalol (NORMODYNE) 300 MG tablet TAKE 1.5 TABLETS BY MOUTH TWICE DAILY   levothyroxine (SYNTHROID) 88 MCG tablet Take 88 mcg by mouth daily.   lisinopril (ZESTRIL) 40 MG tablet TAKE 1 TABLET BY MOUTH EVERY DAY   lurasidone (LATUDA) 40 MG TABS tablet Take 1 tablet (40 mg total) by mouth daily with breakfast.    Polyethylene Glycol 3350 (MIRALAX PO) Take 1 Capful by mouth daily as needed (Constipation).   Potassium Chloride ER 20 MEQ TBCR TAKE 1 TABLET BY MOUTH EVERY DAY   prochlorperazine (COMPAZINE) 10 MG tablet Take 1 tablet (10 mg total) by mouth every 6 (six) hours as needed.   TART CHERRY PO Take 1 tablet by mouth daily.   [DISCONTINUED] doxycycline (VIBRA-TABS) 100 MG tablet Take 1 tablet (100 mg total) by mouth 2 (two) times daily.   sucralfate (CARAFATE) 1 g tablet Take 1 tablet (1 g total) by mouth 3 (three) times daily before meals.   [DISCONTINUED] oxyCODONE (OXY IR/ROXICODONE) 5 MG immediate release tablet 1 tablet 30 minutes before the MRI.  May repeat once if needed. (Patient not taking: Reported on 01/09/2023)   No facility-administered medications prior to visit.   Reviewed past medical and social history.   ROS per HPI above      Objective:  BP 120/60   Pulse 88   Temp 98 F (36.7 C) (Temporal)   Wt 291 lb 9.6 oz (132.3 kg)   LMP 05/02/2017   SpO2 93%   BMI 53.33 kg/m      Physical Exam Vitals reviewed.  Cardiovascular:     Rate and Rhythm: Normal rate and regular rhythm.     Pulses: Normal pulses.     Heart sounds: Normal heart sounds.  Pulmonary:     Effort: Pulmonary effort is normal.     Breath sounds: Normal breath sounds.  Musculoskeletal:     Right lower leg: No edema.     Left lower leg: No edema.       Legs:  Skin:    Findings: Erythema present.  Neurological:     Mental Status: She is alert and oriented to person, place, and time.     Results for orders placed or performed in visit on 02/01/23  POCT glycosylated hemoglobin (Hb A1C)  Result Value Ref Range   Hemoglobin A1C 6.0 (A) 4.0 - 5.6 %   HbA1c POC (<> result, manual entry) 6.0 4.0 - 5.6 %   HbA1c, POC (prediabetic range) 6.0 5.7 - 6.4 %   HbA1c, POC (controlled diabetic range) 6.0 0.0 - 7.0 %      Assessment & Plan:    Problem List Items Addressed This Visit     Cellulitis of  right lower extremity    Onset 3weeks ago, right LE redness, swelling and pain. Negative DVT per venous doppler 01/11/23 No improvement with keflex 500mg  QID x7days. This was switched to doxycycline 100mg  BID, but only tool 1tab daily because she forgot 2nd dose. Today she reports  improved swelling but persistent redness and pain. Pain is worse with walking. CBC    Component Value Date/Time   WBC 5.1 01/23/2023 1049   WBC 6.1 09/20/2022 1123   RBC 3.16 (L) 01/23/2023 1049   HGB 10.2 (L) 01/23/2023 1049   HCT 30.7 (L) 01/23/2023 1049   PLT 350 01/23/2023 1049   MCV 97.2 01/23/2023 1049   MCH 32.3 01/23/2023 1049   MCHC 33.2 01/23/2023 1049   RDW 21.3 (H) 01/23/2023 1049   LYMPHSABS 1.2 01/23/2023 1049   MONOABS 0.5 01/23/2023 1049   EOSABS 0.1 01/23/2023 1049   BASOSABS 0.0 01/23/2023 1049    Lateral spread of Redness outside of marked borders Ordered x-ray of fib/tibia Advised to use smart phone for dose reminder and importance of dose compliance. Extended doxycycline 100mg  BID x 1week Add probiotic 1cap daily to prevent diarrhea F/up in 1week Refer to vascular if persistent redness and pain      Relevant Medications   doxycycline (VIBRA-TABS) 100 MG tablet   Other Relevant Orders   DG Tibia/Fibula Right   DM (diabetes mellitus) (HCC) - Primary    Repeat hgbA1c: 6.0% Controlled glucose Advised to schedule DIABETES eye exam LDL at goal with fenofibrate and atorvastatin BP at goal      Relevant Orders   POCT glycosylated hemoglobin (Hb A1C) (Completed)   Neutropenia, drug-induced (HCC)    resolved    Latest Ref Rng & Units 01/23/2023   10:49 AM 01/03/2023    8:52 AM 12/26/2022    1:37 PM  CBC  WBC 4.0 - 10.5 K/uL 5.1  3.5  4.6   Hemoglobin 12.0 - 15.0 g/dL 16.1  09.6  04.5   Hematocrit 36.0 - 46.0 % 30.7  30.0  31.5   Platelets 150 - 400 K/uL 350  240  110          Urinary hesitancy   Relevant Orders   Ambulatory referral to Urogynecology   Return in about 3  months (around 05/04/2023) for HTN, DM, hyperlipidemia (fasting).     Alysia Penna, NP

## 2023-02-01 NOTE — Patient Instructions (Signed)
Go to [address] for leg x-ray Continue doxycycline for another 1week. Start probiotics- curturelle or align 1cap daily to prevent diarrhea with oral antibiotics. Maintain other med doses Ok to get annual mammogram Schedule appointment for DIABETES eye exam.

## 2023-02-01 NOTE — Progress Notes (Signed)
Patient ID: Madison Palmer, female   DOB: 07-Oct-1965, 57 y.o.   MRN: [id number]  Cc: Left ear hearing loss  HPI: The patient is a 57 year old female who presents today complaining of decreased left ear hearing for the past month.  She has noted increasing clogging sensation in the left ear. The patient has a longstanding history of eustachian tube dysfunction and recurrent ear infections. She had multiple set of tubes in the past. The patient underwent right tympanoplasty several years ago in [place].  According to the patient, she was doing well until 1 month ago, when she started noticing hearing difficulty on the left side.  Currently she denies any otalgia, otorrhea, or vertigo.  Exam: General: Communicates without difficulty, well nourished, no acute distress. Head: Normocephalic, no evidence injury, no tenderness, facial buttresses intact without stepoff. Face/sinus: No tenderness to palpation and percussion. Facial movement is normal and symmetric. Eyes: PERRL, EOMI. No scleral icterus, conjunctivae clear. Neuro: CN II exam reveals vision grossly intact.  No nystagmus at any point of gaze. EAC: Left ear cerumen impaction.  Under the operating microscope, the cerumen is carefully removed with a combination of cerumen currette, alligator forceps, and suction catheters.  After the cerumen is removed, the TMs are noted to be normal. Nose: External evaluation reveals normal support and skin without lesions.  Dorsum is intact.  Anterior rhinoscopy reveals pink mucosa over anterior aspect of inferior turbinates and intact septum.  No purulence noted. Oral:  Oral cavity and oropharynx are intact, symmetric, without erythema or edema.  Mucosa is moist without lesions. Neck: Full range of motion without pain.  There is no significant lymphadenopathy.  No masses palpable.  Thyroid bed within normal limits to palpation.  Parotid glands and submandibular glands equal bilaterally without mass.  Trachea is midline.  Neuro:  CN 2-12 grossly intact.  The patient is wheelchair-bound.  Assessment: 1.  Left ear cerumen impaction. 2.  Transient left ear conductive hearing loss, secondary to the left ear cerumen impaction.  The patient reports significant improvement in her hearing after the disimpaction procedure. 3.  History of bilateral eustachian tube dysfunction.  She previously underwent multiple myringotomy and tube placement procedures.  No tube is noted today.  Plan: 1.  Otomicroscopy with left ear cerumen disimpaction. 2.  The physical exam findings are reviewed with the patient. 3.  The patient defers hearing test today due to her improved hearing. 4.  The patient is encouraged to call with any questions or concerns.

## 2023-02-06 ENCOUNTER — Encounter (HOSPITAL_COMMUNITY): Payer: Self-pay

## 2023-02-06 ENCOUNTER — Ambulatory Visit (HOSPITAL_COMMUNITY)
Admission: RE | Admit: 2023-02-06 | Discharge: 2023-02-06 | Disposition: A | Discharge status: Home / Self Care | Payer: BC Managed Care – PPO | Source: Ambulatory Visit | Attending: Internal Medicine | Admitting: Internal Medicine

## 2023-02-06 DIAGNOSIS — C3490 Malignant neoplasm of unspecified part of unspecified bronchus or lung: Secondary | ICD-10-CM | POA: Diagnosis present

## 2023-02-06 MED ORDER — IOHEXOL 9 MG/ML PO SOLN
ORAL | Status: AC
  Filled 2023-02-06: qty 1000

## 2023-02-06 MED ORDER — IOHEXOL 9 MG/ML PO SOLN
500.0000 mL | ORAL | Status: AC
  Administered 2023-02-06: 1000 mL via ORAL

## 2023-02-06 MED ORDER — IOHEXOL 300 MG/ML  SOLN
100.0000 mL | Freq: Once | INTRAMUSCULAR | Status: AC | PRN
  Administered 2023-02-06: 100 mL via INTRAVENOUS

## 2023-02-08 ENCOUNTER — Encounter: Payer: Self-pay | Admitting: Nurse Practitioner

## 2023-02-08 ENCOUNTER — Ambulatory Visit (INDEPENDENT_AMBULATORY_CARE_PROVIDER_SITE_OTHER): Payer: BC Managed Care – PPO | Admitting: Nurse Practitioner

## 2023-02-08 VITALS — BP 108/62 | HR 71 | Temp 98.0°F | Ht 64.0 in | Wt 292.2 lb

## 2023-02-08 DIAGNOSIS — L03115 Cellulitis of right lower limb: Secondary | ICD-10-CM | POA: Diagnosis not present

## 2023-02-08 DIAGNOSIS — I890 Lymphedema, not elsewhere classified: Secondary | ICD-10-CM | POA: Diagnosis not present

## 2023-02-08 MED ORDER — SULFAMETHOXAZOLE-TRIMETHOPRIM 800-160 MG PO TABS
1.0000 | ORAL_TABLET | Freq: Two times a day (BID) | ORAL | 0 refills | Status: DC
Start: 2023-02-08 — End: 2023-03-01

## 2023-02-08 NOTE — Patient Instructions (Signed)
Stop doxycyline Start bactrim 1tab BID x 10days. Take with food Continue probiotic 1cap daily Wear compression stocking during the day and office at night. You will be contacted to schedule appointment with vascular specialist

## 2023-02-08 NOTE — Assessment & Plan Note (Addendum)
No improvement with keflex 500mg  QID x 7days and doxycycline 100mg  BID x 7days. Persistent pain, R. LE edema, lateral LE redness and increased warmth, hyperpigmentation and dimpling around ankle.  Stop doxycyline Start bactrim 1tab BID x 10days. Continue probiotic 1cap daily Advised to Wear compression stocking during the day and office at night. Entered referral to vascular specialist.

## 2023-02-08 NOTE — Progress Notes (Signed)
Established Patient Visit  Patient: Madison Palmer   DOB: [birth year]   57 y.o. Female  MRN: [id number] Visit Date: 02/08/2023  Subjective:    Chief Complaint  Patient presents with   Follow-up   HPI Cellulitis of right lower extremity No improvement with keflex 500mg  QID x 7days and doxycycline 100mg  BID x 7days. Persistent pain, R. LE edema, lateral LE redness and increased warmth, hyperpigmentation and dimpling around ankle.  Stop doxycyline Start bactrim 1tab BID x 10days. Continue probiotic 1cap daily Advised to Wear compression stocking during the day and office at night. Entered referral to vascular specialist.   Reviewed medical, surgical, and social history today  Medications: Outpatient Medications Prior to Visit  Medication Sig   acetaminophen (TYLENOL) 500 MG tablet Take 1,000 mg by mouth 3 (three) times daily as needed for moderate pain or headache.   amLODipine (NORVASC) 10 MG tablet TAKE 1 TABLET BY MOUTH EVERY DAY   atorvastatin (LIPITOR) 20 MG tablet TAKE 1 TABLET(20 MG) BY MOUTH 3 TIMES A WEEK   benztropine (COGENTIN) 0.5 MG tablet Take 1 tablet (0.5 mg total) by mouth at bedtime.   cetirizine (ZYRTEC) 10 MG tablet TAKE 1 TABLET BY MOUTH EVERY DAY   Cholecalciferol (VITAMIN D3 MAXIMUM STRENGTH) 125 MCG (5000 UT) capsule Take 5,000 Units by mouth daily.   cinacalcet (SENSIPAR) 30 MG tablet Take 30 mg by mouth daily.   cloNIDine (CATAPRES) 0.1 MG tablet TAKE 1 TABLET(0.1 MG) BY MOUTH THREE TIMES DAILY   esomeprazole (NEXIUM) 40 MG capsule TAKE 1 CAPSULE BY MOUTH TWICE DAILY   fenofibrate (TRICOR) 145 MG tablet TAKE 1 TABLET(145 MG) BY MOUTH DAILY   folic acid (FOLVITE) 1 MG tablet TAKE 1 TABLET BY MOUTH DAILY   furosemide (LASIX) 40 MG tablet TAKE 1 TABLET BY MOUTH EVERY DAY   gabapentin (NEURONTIN) 100 MG capsule TAKE 2 CAPSULES(200 MG) BY MOUTH AT BEDTIME   labetalol (NORMODYNE) 300 MG tablet TAKE 1.5 TABLETS BY MOUTH TWICE DAILY    levothyroxine (SYNTHROID) 88 MCG tablet Take 88 mcg by mouth daily.   lisinopril (ZESTRIL) 40 MG tablet TAKE 1 TABLET BY MOUTH EVERY DAY   lurasidone (LATUDA) 40 MG TABS tablet Take 1 tablet (40 mg total) by mouth daily with breakfast.   Polyethylene Glycol 3350 (MIRALAX PO) Take 1 Capful by mouth daily as needed (Constipation).   Potassium Chloride ER 20 MEQ TBCR TAKE 1 TABLET BY MOUTH EVERY DAY   prochlorperazine (COMPAZINE) 10 MG tablet Take 1 tablet (10 mg total) by mouth every 6 (six) hours as needed.   TART CHERRY PO Take 1 tablet by mouth daily.   [DISCONTINUED] doxycycline (VIBRA-TABS) 100 MG tablet Take 1 tablet (100 mg total) by mouth 2 (two) times daily.   sucralfate (CARAFATE) 1 g tablet Take 1 tablet (1 g total) by mouth 3 (three) times daily before meals.   No facility-administered medications prior to visit.   Reviewed past medical and social history.   ROS per HPI above  Last CBC Lab Results  Component Value Date   WBC 5.1 01/23/2023   HGB 10.2 (L) 01/23/2023   HCT 30.7 (L) 01/23/2023   MCV 97.2 01/23/2023   MCH 32.3 01/23/2023   RDW 21.3 (H) 01/23/2023   PLT 350 01/23/2023   Last metabolic panel Lab Results  Component Value Date   GLUCOSE 118 (H) 01/23/2023   NA 141 01/23/2023   K  3.8 01/23/2023   CL 103 01/23/2023   CO2 31 01/23/2023   BUN 9 01/23/2023   CREATININE 1.11 (H) 01/23/2023   GFRNONAA 58 (L) 01/23/2023   CALCIUM 9.2 01/23/2023   PHOS 2.9 (L) 09/01/2020   PROT 6.8 01/23/2023   ALBUMIN 3.7 01/23/2023   LABGLOB 2.7 06/10/2020   AGRATIO 1.6 06/10/2020   BILITOT 0.5 01/23/2023   ALKPHOS 65 01/23/2023   AST 19 01/23/2023   ALT 13 01/23/2023   ANIONGAP 7 01/23/2023      Objective:  BP 108/62   Pulse 71   Temp 98 F (36.7 C) (Temporal)   Ht 5\' 4"  (1.626 m)   Wt 292 lb 3.2 oz (132.5 kg)   LMP 05/02/2017   SpO2 93%   BMI 50.16 kg/m      Physical Exam  No results found for any visits on 02/08/23.    Assessment & Plan:    Problem  List Items Addressed This Visit     Cellulitis of right lower extremity    No improvement with keflex 500mg  QID x 7days and doxycycline 100mg  BID x 7days. Persistent pain, R. LE edema, lateral LE redness and increased warmth, hyperpigmentation and dimpling around ankle.  Stop doxycyline Start bactrim 1tab BID x 10days. Continue probiotic 1cap daily Advised to Wear compression stocking during the day and office at night. Entered referral to vascular specialist.      Relevant Medications   sulfamethoxazole-trimethoprim (BACTRIM DS) 800-160 MG tablet   Other Relevant Orders   Ambulatory referral to Vascular Surgery   Lymphatic edema - Primary   Relevant Medications   sulfamethoxazole-trimethoprim (BACTRIM DS) 800-160 MG tablet   Other Relevant Orders   Ambulatory referral to Vascular Surgery   Return if symptoms worsen or fail to improve.     Alysia Penna, NP

## 2023-02-13 ENCOUNTER — Inpatient Hospital Stay (HOSPITAL_BASED_OUTPATIENT_CLINIC_OR_DEPARTMENT_OTHER): Payer: BC Managed Care – PPO | Admitting: Internal Medicine

## 2023-02-13 ENCOUNTER — Inpatient Hospital Stay: Payer: BC Managed Care – PPO

## 2023-02-13 ENCOUNTER — Ambulatory Visit (INDEPENDENT_AMBULATORY_CARE_PROVIDER_SITE_OTHER): Payer: BC Managed Care – PPO | Admitting: Family Medicine

## 2023-02-13 ENCOUNTER — Encounter (INDEPENDENT_AMBULATORY_CARE_PROVIDER_SITE_OTHER): Payer: Self-pay | Admitting: Family Medicine

## 2023-02-13 ENCOUNTER — Inpatient Hospital Stay: Payer: BC Managed Care – PPO | Attending: Physician Assistant

## 2023-02-13 VITALS — BP 130/76 | HR 69 | Temp 98.0°F | Resp 17 | Ht 64.0 in | Wt 292.4 lb

## 2023-02-13 VITALS — BP 143/76 | HR 78 | Temp 98.6°F | Ht 62.0 in | Wt 289.0 lb

## 2023-02-13 DIAGNOSIS — Z5112 Encounter for antineoplastic immunotherapy: Secondary | ICD-10-CM | POA: Diagnosis present

## 2023-02-13 DIAGNOSIS — R059 Cough, unspecified: Secondary | ICD-10-CM | POA: Insufficient documentation

## 2023-02-13 DIAGNOSIS — Z6841 Body Mass Index (BMI) 40.0 and over, adult: Secondary | ICD-10-CM

## 2023-02-13 DIAGNOSIS — I10 Essential (primary) hypertension: Secondary | ICD-10-CM

## 2023-02-13 DIAGNOSIS — C3492 Malignant neoplasm of unspecified part of left bronchus or lung: Secondary | ICD-10-CM | POA: Diagnosis not present

## 2023-02-13 DIAGNOSIS — N289 Disorder of kidney and ureter, unspecified: Secondary | ICD-10-CM | POA: Diagnosis not present

## 2023-02-13 DIAGNOSIS — Z79620 Long term (current) use of immunosuppressive biologic: Secondary | ICD-10-CM | POA: Insufficient documentation

## 2023-02-13 DIAGNOSIS — R600 Localized edema: Secondary | ICD-10-CM | POA: Diagnosis not present

## 2023-02-13 DIAGNOSIS — R197 Diarrhea, unspecified: Secondary | ICD-10-CM | POA: Insufficient documentation

## 2023-02-13 DIAGNOSIS — R7989 Other specified abnormal findings of blood chemistry: Secondary | ICD-10-CM | POA: Diagnosis not present

## 2023-02-13 DIAGNOSIS — C3490 Malignant neoplasm of unspecified part of unspecified bronchus or lung: Secondary | ICD-10-CM

## 2023-02-13 DIAGNOSIS — C787 Secondary malignant neoplasm of liver and intrahepatic bile duct: Secondary | ICD-10-CM | POA: Insufficient documentation

## 2023-02-13 DIAGNOSIS — E669 Obesity, unspecified: Secondary | ICD-10-CM

## 2023-02-13 DIAGNOSIS — R5383 Other fatigue: Secondary | ICD-10-CM | POA: Insufficient documentation

## 2023-02-13 DIAGNOSIS — Z5111 Encounter for antineoplastic chemotherapy: Secondary | ICD-10-CM | POA: Diagnosis present

## 2023-02-13 LAB — CBC WITH DIFFERENTIAL (CANCER CENTER ONLY)
Abs Immature Granulocytes: 0.02 10*3/uL (ref 0.00–0.07)
Basophils Absolute: 0 10*3/uL (ref 0.0–0.1)
Basophils Relative: 1 %
Eosinophils Absolute: 0.1 10*3/uL (ref 0.0–0.5)
Eosinophils Relative: 3 %
HCT: 29.5 % — ABNORMAL LOW (ref 36.0–46.0)
Hemoglobin: 10 g/dL — ABNORMAL LOW (ref 12.0–15.0)
Immature Granulocytes: 1 %
Lymphocytes Relative: 27 %
Lymphs Abs: 1 10*3/uL (ref 0.7–4.0)
MCH: 34.7 pg — ABNORMAL HIGH (ref 26.0–34.0)
MCHC: 33.9 g/dL (ref 30.0–36.0)
MCV: 102.4 fL — ABNORMAL HIGH (ref 80.0–100.0)
Monocytes Absolute: 0.4 10*3/uL (ref 0.1–1.0)
Monocytes Relative: 11 %
Neutro Abs: 2.2 10*3/uL (ref 1.7–7.7)
Neutrophils Relative %: 57 %
Platelet Count: 371 10*3/uL (ref 150–400)
RBC: 2.88 MIL/uL — ABNORMAL LOW (ref 3.87–5.11)
RDW: 18.9 % — ABNORMAL HIGH (ref 11.5–15.5)
WBC Count: 3.8 10*3/uL — ABNORMAL LOW (ref 4.0–10.5)
nRBC: 0 % (ref 0.0–0.2)

## 2023-02-13 LAB — CMP (CANCER CENTER ONLY)
ALT: 9 U/L (ref 0–44)
AST: 18 U/L (ref 15–41)
Albumin: 3.7 g/dL (ref 3.5–5.0)
Alkaline Phosphatase: 67 U/L (ref 38–126)
Anion gap: 7 (ref 5–15)
BUN: 11 mg/dL (ref 6–20)
CO2: 29 mmol/L (ref 22–32)
Calcium: 9.9 mg/dL (ref 8.9–10.3)
Chloride: 104 mmol/L (ref 98–111)
Creatinine: 1.42 mg/dL — ABNORMAL HIGH (ref 0.44–1.00)
GFR, Estimated: 43 mL/min — ABNORMAL LOW (ref >60)
Glucose, Bld: 108 mg/dL — ABNORMAL HIGH (ref 70–99)
Potassium: 4 mmol/L (ref 3.5–5.1)
Sodium: 140 mmol/L (ref 135–145)
Total Bilirubin: 0.5 mg/dL (ref 0.3–1.2)
Total Protein: 7.2 g/dL (ref 6.5–8.1)

## 2023-02-13 LAB — TSH: TSH: 1.975 u[IU]/mL (ref 0.350–4.500)

## 2023-02-13 MED ORDER — SODIUM CHLORIDE 0.9 % IV SOLN
200.0000 mg | Freq: Once | INTRAVENOUS | Status: AC
  Administered 2023-02-13: 200 mg via INTRAVENOUS
  Filled 2023-02-13: qty 200

## 2023-02-13 MED ORDER — SODIUM CHLORIDE 0.9 % IV SOLN
500.0000 mg/m2 | Freq: Once | INTRAVENOUS | Status: AC
  Administered 2023-02-13: 1200 mg via INTRAVENOUS
  Filled 2023-02-13: qty 40

## 2023-02-13 MED ORDER — SODIUM CHLORIDE 0.9 % IV SOLN: Freq: Once | INTRAVENOUS | Status: AC

## 2023-02-13 MED ORDER — PROCHLORPERAZINE MALEATE 10 MG PO TABS
10.0000 mg | ORAL_TABLET | Freq: Once | ORAL | Status: AC
  Administered 2023-02-13: 10 mg via ORAL
  Filled 2023-02-13: qty 1

## 2023-02-13 NOTE — Patient Instructions (Signed)
[facility] [facility]  Discharge Instructions: Thank you for choosing [facility] to provide your oncology and hematology care.   If you have a lab appointment with the Cancer Center, please go directly to the Cancer Center and check in at the registration area.   Wear comfortable clothing and clothing appropriate for easy access to any Portacath or PICC line.   We strive to give you quality time with your provider. You may need to reschedule your appointment if you arrive late (15 or more minutes).  Arriving late affects you and other patients whose appointments are after yours.  Also, if you miss three or more appointments without notifying the office, you may be dismissed from the clinic at the provider's discretion.      For prescription refill requests, have your pharmacy contact our office and allow 72 hours for refills to be completed.    Today you received the following chemotherapy and/or immunotherapy agents: pembrolizumab and pemetrexed      To help prevent nausea and vomiting after your treatment, we encourage you to take your nausea medication as directed.  BELOW ARE SYMPTOMS THAT SHOULD BE REPORTED IMMEDIATELY: *FEVER GREATER THAN 100.4 F (38 C) OR HIGHER *CHILLS OR SWEATING *NAUSEA AND VOMITING THAT IS NOT CONTROLLED WITH YOUR NAUSEA MEDICATION *UNUSUAL SHORTNESS OF BREATH *UNUSUAL BRUISING OR BLEEDING *URINARY PROBLEMS (pain or burning when urinating, or frequent urination) *BOWEL PROBLEMS (unusual diarrhea, constipation, pain near the anus) TENDERNESS IN MOUTH AND THROAT WITH OR WITHOUT PRESENCE OF ULCERS (sore throat, sores in mouth, or a toothache) UNUSUAL RASH, SWELLING OR PAIN  UNUSUAL VAGINAL DISCHARGE OR ITCHING   Items with * indicate a potential emergency and should be followed up as soon as possible or go to the Emergency Department if any problems should occur.  Please show the CHEMOTHERAPY ALERT CARD or IMMUNOTHERAPY  ALERT CARD at check-in to the Emergency Department and triage nurse.  Should you have questions after your visit or need to cancel or reschedule your appointment, please contact [place] [facility]  Dept: [phone]  and follow the prompts.  Office hours are 8:00 a.m. to 4:30 p.m. Monday - Friday. Please note that voicemails left after 4:00 p.m. may not be returned until the following business day.  We are closed weekends and major holidays. You have access to a nurse at all times for urgent questions. Please call the main number to the clinic Dept: [phone] and follow the prompts.   For any non-urgent questions, you may also contact your provider using MyChart. We now offer e-Visits for anyone 72 and older to request care online for non-urgent symptoms. For details visit [email or web address].   Also download the MyChart app! Go to the app store, search "MyChart", open the app, select [place], and log in with your MyChart username and password.

## 2023-02-13 NOTE — Progress Notes (Signed)
[facility] [phone]   [phone]  OFFICE PROGRESS NOTE  [name], [name], NP [address] [place] [address]   DIAGNOSIS: Stage IV non-small cell lung cancer, adenocarcinoma (T2a, N3, M1c).  She presented with bilateral lung nodules, thoracic adenopathy and bilobar liver lesions.  She was diagnosed in May 2024.     PDL1: 0   Guardant 360: Negative for any actionable mutations   Foundation One: No actionable mutations    PRIOR THERAPY: None   CURRENT THERAPY: Palliative systemic chemotherapy and immunotherapy with carboplatin for an AUC of 5, alimta 500 mg/m2, and Keytruda 200 mg IV every 3 weeks. First dose on 10/10/22. Status post 6 cycles.  Starting from cycle #5 the patient is on maintenance treatment with Alimta and Keytruda every 3 weeks  INTERVAL HISTORY: Madison Palmer 57 y.o. female returns to clinic today for follow-up visit accompanied by her son. Discussed the use of AI scribe software for clinical note transcription with the patient, who gave verbal consent to proceed.  History of Present Illness   Madison Palmer, a 57 year old patient with stage four non-small cell lung adenocarcinoma, presents for a follow-up visit. The patient reports experiencing periods of physical fatigue, which seem to occur randomly throughout the three-week period between chemotherapy sessions. Interestingly, the patient notes feeling stronger as the day of chemotherapy approaches. Despite the fatigue, the patient continues to work the day after chemotherapy, suggesting that the treatment itself does not directly contribute to the fatigue.  The patient has been on a chemotherapy regimen, initially starting with carboplatin, Alimta, and Keytruda for four cycles, and now on Alimta and Keytruda as maintenance therapy. The patient is currently here for the seventh cycle of treatment.  In addition to the fatigue, the patient has noticed  darkening and tightening of the skin on the legs. Despite raising the legs at the end of the day and wearing compression socks, the symptoms persist. The patient also reports adequate hydration, consuming about five bottles of water a day.        MEDICAL HISTORY: Past Medical History:  Diagnosis Date   Acute on chronic respiratory failure with hypoxia and hypercapnia (HCC) 09/15/2018   Anemia    Anxiety    Arrhythmia    tachycardia   Arthritis    Bipolar 1 disorder (HCC) 09/12/2018   Bipolar I disorder, current or most recent episode manic, with psychotic features (HCC)    Brief psychotic disorder (HCC) 08/22/2018   Chronic respiratory failure with hypoxia (HCC) 05/10/2018   Formatting of this note might be different from the original. Last Assessment & Plan:  Continue 1 L continuous on exertion and during sleep. On her next visit we will check OSA on CPAP/room air to decide whether she needs to take oxygen along with her on her cruise in May   Depression    Diverticulitis    Generalized edema 03/12/2018   GERD (gastroesophageal reflux disease)    Glaucoma    Hyperlipidemia    Hyperparathyroidism (HCC)    Hypertension    Inflammatory polyps of colon (HCC)    Leg swelling 01/14/2019   LVH (left ventricular hypertrophy)    Lymphedema    Morbid (severe) obesity due to excess calories (HCC) 05/11/2017   PCOS (polycystic ovarian syndrome)    Prediabetes    Recurrent UTI    Sleep apnea    CPAP   Thyroid disease    Vitamin D deficiency  ALLERGIES:  is allergic to other, fentanyl, levofloxacin, midazolam, pollen extract, atorvastatin, hydralazine hcl, and rosuvastatin.  MEDICATIONS:  Current Outpatient Medications  Medication Sig Dispense Refill   acetaminophen (TYLENOL) 500 MG tablet Take 1,000 mg by mouth 3 (three) times daily as needed for moderate pain or headache.     amLODipine (NORVASC) 10 MG tablet TAKE 1 TABLET BY MOUTH EVERY DAY 90 tablet 3   atorvastatin  (LIPITOR) 20 MG tablet TAKE 1 TABLET(20 MG) BY MOUTH 3 TIMES A WEEK 12 tablet 5   benztropine (COGENTIN) 0.5 MG tablet Take 1 tablet (0.5 mg total) by mouth at bedtime. 30 tablet 2   cetirizine (ZYRTEC) 10 MG tablet TAKE 1 TABLET BY MOUTH EVERY DAY 90 tablet 1   Cholecalciferol (VITAMIN D3 MAXIMUM STRENGTH) 125 MCG (5000 UT) capsule Take 5,000 Units by mouth daily.     cinacalcet (SENSIPAR) 30 MG tablet Take 30 mg by mouth daily.     cloNIDine (CATAPRES) 0.1 MG tablet TAKE 1 TABLET(0.1 MG) BY MOUTH THREE TIMES DAILY 90 tablet 3   esomeprazole (NEXIUM) 40 MG capsule TAKE 1 CAPSULE BY MOUTH TWICE DAILY 180 capsule 2   fenofibrate (TRICOR) 145 MG tablet TAKE 1 TABLET(145 MG) BY MOUTH DAILY 90 tablet 3   folic acid (FOLVITE) 1 MG tablet TAKE 1 TABLET BY MOUTH DAILY 30 tablet 2   furosemide (LASIX) 40 MG tablet TAKE 1 TABLET BY MOUTH EVERY DAY 90 tablet 3   gabapentin (NEURONTIN) 100 MG capsule TAKE 2 CAPSULES(200 MG) BY MOUTH AT BEDTIME 180 capsule 3   labetalol (NORMODYNE) 300 MG tablet TAKE 1.5 TABLETS BY MOUTH TWICE DAILY 270 tablet 3   levothyroxine (SYNTHROID) 88 MCG tablet Take 88 mcg by mouth daily.     lisinopril (ZESTRIL) 40 MG tablet TAKE 1 TABLET BY MOUTH EVERY DAY 90 tablet 1   lurasidone (LATUDA) 40 MG TABS tablet Take 1 tablet (40 mg total) by mouth daily with breakfast. 30 tablet 2   Polyethylene Glycol 3350 (MIRALAX PO) Take 1 Capful by mouth daily as needed (Constipation).     Potassium Chloride ER 20 MEQ TBCR TAKE 1 TABLET BY MOUTH EVERY DAY 90 tablet 2   prochlorperazine (COMPAZINE) 10 MG tablet Take 1 tablet (10 mg total) by mouth every 6 (six) hours as needed. 30 tablet 2   sucralfate (CARAFATE) 1 g tablet Take 1 tablet (1 g total) by mouth 3 (three) times daily before meals. 90 tablet 3   sulfamethoxazole-trimethoprim (BACTRIM DS) 800-160 MG tablet Take 1 tablet by mouth 2 (two) times daily. 20 tablet 0   TART CHERRY PO Take 1 tablet by mouth daily.     No current  facility-administered medications for this visit.    SURGICAL HISTORY:  Past Surgical History:  Procedure Laterality Date   BIOPSY  09/27/2022   Procedure: BIOPSY;  Surgeon: Mansouraty, Netty Starring., MD;  Location: [facility] ENDOSCOPY;  Service: Gastroenterology;;   BREAST BIOPSY  2015   CESAREAN SECTION  2004   CHOLECYSTECTOMY     COLONOSCOPY WITH PROPOFOL N/A 09/27/2022   Procedure: COLONOSCOPY WITH PROPOFOL;  Surgeon: Lemar Lofty., MD;  Location: [facility] ENDOSCOPY;  Service: Gastroenterology;  Laterality: N/A;   ESOPHAGOGASTRODUODENOSCOPY (EGD) WITH PROPOFOL N/A 09/27/2022   Procedure: ESOPHAGOGASTRODUODENOSCOPY (EGD) WITH PROPOFOL;  Surgeon: Meridee Score Netty Starring., MD;  Location: [facility] ENDOSCOPY;  Service: Gastroenterology;  Laterality: N/A;   INNER EAR SURGERY     ear and sinus surgery   LAPAROSCOPIC REPAIR AND REMOVAL OF GASTRIC BAND  MALONEY DILATION  09/27/2022   Procedure: MALONEY DILATION;  Surgeon: Lemar Lofty., MD;  Location: [facility] ENDOSCOPY;  Service: Gastroenterology;;   OOPHORECTOMY Left    POLYPECTOMY  09/27/2022   Procedure: POLYPECTOMY;  Surgeon: Lemar Lofty., MD;  Location: [facility] ENDOSCOPY;  Service: Gastroenterology;;   TONSILLECTOMY AND ADENOIDECTOMY      REVIEW OF SYSTEMS:  Constitutional: positive for fatigue Eyes: negative Ears, nose, mouth, throat, and face: negative Respiratory: positive for cough Cardiovascular: negative Gastrointestinal: positive for diarrhea Genitourinary:negative Integument/breast: negative Hematologic/lymphatic: negative Musculoskeletal:positive for arthralgias Neurological: negative Behavioral/Psych: negative Endocrine: negative Allergic/Immunologic: negative   PHYSICAL EXAMINATION: General appearance: alert, cooperative, fatigued, and no distress Head: Normocephalic, without obvious abnormality, atraumatic Neck: no adenopathy, no JVD, supple, symmetrical, trachea midline, and thyroid not enlarged, symmetric, no  tenderness/mass/nodules Lymph nodes: Cervical, supraclavicular, and axillary nodes normal. Resp: clear to auscultation bilaterally Back: symmetric, no curvature. ROM normal. No CVA tenderness. Cardio: regular rate and rhythm, S1, S2 normal, no murmur, click, rub or gallop GI: soft, non-tender; bowel sounds normal; no masses,  no organomegaly Extremities: edema 1+ and small area of erythema in the right leg. Neurologic: Alert and oriented X 3, normal strength and tone. Normal symmetric reflexes. Normal coordination and gait  ECOG PERFORMANCE STATUS: 1 - Symptomatic but completely ambulatory  Blood pressure 130/76, pulse 69, temperature 98 F (36.7 C), temperature source Oral, resp. rate 17, height 5\' 4"  (1.626 m), weight 292 lb 6.4 oz (132.6 kg), last menstrual period 05/02/2017, SpO2 100%.  LABORATORY DATA: Lab Results  Component Value Date   WBC 3.8 (L) 02/13/2023   HGB 10.0 (L) 02/13/2023   HCT 29.5 (L) 02/13/2023   MCV 102.4 (H) 02/13/2023   PLT 371 02/13/2023      Chemistry      Component Value Date/Time   NA 140 02/13/2023 0917   NA 145 09/08/2021 0000   K 4.0 02/13/2023 0917   CL 104 02/13/2023 0917   CO2 29 02/13/2023 0917   BUN 11 02/13/2023 0917   BUN 36 (H) 08/22/2021 1606   CREATININE 1.42 (H) 02/13/2023 0917   CREATININE 1.13 (H) 12/24/2018 1402   GLU 97 09/08/2021 0000      Component Value Date/Time   CALCIUM 9.9 02/13/2023 0917   ALKPHOS 67 02/13/2023 0917   AST 18 02/13/2023 0917   ALT 9 02/13/2023 0917   BILITOT 0.5 02/13/2023 0917       RADIOGRAPHIC STUDIES: CT Chest W Contrast  Result Date: 02/12/2023 CLINICAL DATA:  Bronchogenic carcinoma with liver metastasis. Assess treatment response. * Tracking Code: BO * EXAM: CT CHEST, ABDOMEN, AND PELVIS WITH CONTRAST TECHNIQUE: Multidetector CT imaging of the chest, abdomen and pelvis was performed following the standard protocol during bolus administration of intravenous contrast. RADIATION DOSE  REDUCTION: This exam was performed according to the departmental dose-optimization program which includes automated exposure control, adjustment of the mA and/or kV according to patient size and/or use of iterative reconstruction technique. CONTRAST:  OMNIPAQUE IOHEXOL 300 MG/ML  SOLN COMPARISON:  CT 12/06/2022 com PET-CT 08/21/2022 FINDINGS: CT CHEST FINDINGS Cardiovascular: No significant vascular findings. Normal heart size. No pericardial effusion. Mediastinum/Nodes: Mildly enlarged mediastinal lymph nodes similar prior. Example subcarinal node measures 18 mm compared to 17 mm. LEFT hilar node measures 16 mm compared to 23 mm. No new adenopathy. Lungs/Pleura: Again demonstrated bilateral pulmonary nodules and LEFT lobe pulmonary mass. The LEFT lobe mass measures 3.7 by 2.5 cm (image 80/5) compared to 3.8 x 2.7 cm for no  significant change. Example RIGHT lower lobe nodule measures 12 mm (image 73/5) compared to 14 mm. Example RIGHT upper lobe nodule measuring 8 mm (image 33/5) compares to 6 mm. Example LEFT upper lobe nodule measures 7 mm (image 46) compared to 8 mm. No new nodules identified. Musculoskeletal: Degenerative osteophytosis of the spine. CT ABDOMEN AND PELVIS FINDINGS Hepatobiliary: Multifocal hepatic metastasis again noted. Lesions are more clearly demarcated on current exam . Lesion central liver measuring 3.2 cm (image 45/3) compared to 2.8 mm. Lesion LEFT hepatic lobe measuring 2.5 cm (image 42) compares to 2.3 cm Lesions subcapsular RIGHT hepatic lobe measuring 4.6 cm (image 48) si poorly defined on prior measuring approximately 3 cm on prior. Pancreas: Pancreas is normal. No ductal dilatation. No pancreatic inflammation. Spleen: Normal spleen Adrenals/urinary tract: Thickening of the LEFT adrenal gland unchanged. Kidneys, ureters and bladder normal. Stomach/Bowel: Stomach, small bowel, appendix, and cecum are normal. The colon and rectosigmoid colon are normal. Vascular/Lymphatic:  Abdominal aorta is normal caliber. There is no retroperitoneal or periportal lymphadenopathy. No pelvic lymphadenopathy. Reproductive: Uterus and adnexa unremarkable. Other: No free fluid. Musculoskeletal: No aggressive osseous lesion. IMPRESSION: CHEST: 1. Stable LEFT lobe pulmonary mass and bilateral pulmonary nodules. 2. Stable mediastinal and LEFT hilar adenopathy. PELVIS: 1. Concern for progression of hepatic metastasis. Lesions are more conspicuous than most recent comparison CT scan which may in part be due to technique but concern for progression of hepatic metastasis. 2. No new sites of metastatic disease in the abdomen pelvis. Electronically Signed   By: Genevive Bi M.D.   On: 02/12/2023 17:07   CT ABDOMEN PELVIS W CONTRAST  Result Date: 02/12/2023 CLINICAL DATA:  Bronchogenic carcinoma with liver metastasis. Assess treatment response. * Tracking Code: BO * EXAM: CT CHEST, ABDOMEN, AND PELVIS WITH CONTRAST TECHNIQUE: Multidetector CT imaging of the chest, abdomen and pelvis was performed following the standard protocol during bolus administration of intravenous contrast. RADIATION DOSE REDUCTION: This exam was performed according to the departmental dose-optimization program which includes automated exposure control, adjustment of the mA and/or kV according to patient size and/or use of iterative reconstruction technique. CONTRAST:  OMNIPAQUE IOHEXOL 300 MG/ML  SOLN COMPARISON:  CT 12/06/2022 com PET-CT 08/21/2022 FINDINGS: CT CHEST FINDINGS Cardiovascular: No significant vascular findings. Normal heart size. No pericardial effusion. Mediastinum/Nodes: Mildly enlarged mediastinal lymph nodes similar prior. Example subcarinal node measures 18 mm compared to 17 mm. LEFT hilar node measures 16 mm compared to 23 mm. No new adenopathy. Lungs/Pleura: Again demonstrated bilateral pulmonary nodules and LEFT lobe pulmonary mass. The LEFT lobe mass measures 3.7 by 2.5 cm (image 80/5) compared to 3.8  x 2.7 cm for no significant change. Example RIGHT lower lobe nodule measures 12 mm (image 73/5) compared to 14 mm. Example RIGHT upper lobe nodule measuring 8 mm (image 33/5) compares to 6 mm. Example LEFT upper lobe nodule measures 7 mm (image 46) compared to 8 mm. No new nodules identified. Musculoskeletal: Degenerative osteophytosis of the spine. CT ABDOMEN AND PELVIS FINDINGS Hepatobiliary: Multifocal hepatic metastasis again noted. Lesions are more clearly demarcated on current exam . Lesion central liver measuring 3.2 cm (image 45/3) compared to 2.8 mm. Lesion LEFT hepatic lobe measuring 2.5 cm (image 42) compares to 2.3 cm Lesions subcapsular RIGHT hepatic lobe measuring 4.6 cm (image 48) si poorly defined on prior measuring approximately 3 cm on prior. Pancreas: Pancreas is normal. No ductal dilatation. No pancreatic inflammation. Spleen: Normal spleen Adrenals/urinary tract: Thickening of the LEFT adrenal gland unchanged. Kidneys, ureters and  bladder normal. Stomach/Bowel: Stomach, small bowel, appendix, and cecum are normal. The colon and rectosigmoid colon are normal. Vascular/Lymphatic: Abdominal aorta is normal caliber. There is no retroperitoneal or periportal lymphadenopathy. No pelvic lymphadenopathy. Reproductive: Uterus and adnexa unremarkable. Other: No free fluid. Musculoskeletal: No aggressive osseous lesion. IMPRESSION: CHEST: 1. Stable LEFT lobe pulmonary mass and bilateral pulmonary nodules. 2. Stable mediastinal and LEFT hilar adenopathy. PELVIS: 1. Concern for progression of hepatic metastasis. Lesions are more conspicuous than most recent comparison CT scan which may in part be due to technique but concern for progression of hepatic metastasis. 2. No new sites of metastatic disease in the abdomen pelvis. Electronically Signed   By: Genevive Bi M.D.   On: 02/12/2023 17:07   DG Tibia/Fibula Right  Result Date: 02/01/2023 CLINICAL DATA:  Right lower leg extremity pain and redness for  1 month. No known injury. EXAM: RIGHT TIBIA AND FIBULA - 2 VIEW COMPARISON:  None Available. FINDINGS: There is no evidence of fracture or other focal bone lesions. Soft tissues are unremarkable. IMPRESSION: Negative. Electronically Signed   By: Lupita Raider M.D.   On: 02/01/2023 16:55    ASSESSMENT AND PLAN: This is a very pleasant 57 years old African-American female with stage IV non-small cell lung cancer, adenocarcinoma (T2a, N3, M1c).  She presented with bilateral lung nodules, thoracic adenopathy and bilobar liver lesions.  She was diagnosed in May 2024.   She has no actionable mutations and PD-L1 expression was negative. The patient is currently undergoing systemic chemoimmunotherapy with carboplatin for AUC 5, Alimta 500 Mg/M2 and Keytruda 200 Mg IV every 3 weeks status post 6 cycles.  Starting from cycle #5 she is on maintenance treatment with Alimta and Keytruda every 3 weeks.  She has been tolerating this treatment fairly well.    Stage IV Non-Small Cell Lung Cancer (Adenocarcinoma) Currently on maintenance therapy with Alimta and Keytruda after initial 4 cycles of induction therapy with carboplatin, Alimta, and Keytruda. Recent CT scan shows stable disease in the chest but some areas in the liver are more prominent than before, raising concern for disease progression. -Continue current treatment regimen today. -Order PET scan in 2 weeks to further evaluate liver lesions. -Consider change in treatment if PET scan shows disease progression.  Fatigue Experiencing periods of fatigue during the three-week period between chemotherapy cycles, improving closer to the day of chemotherapy. -Continue to monitor.  Lower Extremity Edema Noted darkening and tightness of the skin on the legs. Patient is already elevating legs and wearing compression socks. -Continue current management and monitor.  Mild Renal Insufficiency Slight elevation in kidney function on recent lab work, but still  acceptable for treatment today. -Advise patient to hydrate more. -Monitor kidney function closely.   The patient was advised to call immediately if she has any other concerning symptoms in the interval. She was advised to call immediately if she has any other concerning symptoms in the interval. The patient voices understanding of current disease status and treatment options and is in agreement with the current care plan.  All questions were answered. The patient knows to call the clinic with any problems, questions or concerns. We can certainly see the patient much sooner if necessary.  The total time spent in the appointment was 30 minutes.  Disclaimer: This note was dictated with voice recognition software. Similar sounding words can inadvertently be transcribed and may not be corrected upon review.

## 2023-02-13 NOTE — Progress Notes (Signed)
Chief Complaint:   OBESITY Madison Palmer is here to discuss her progress with her obesity treatment plan along with follow-up of her obesity related diagnoses. Madison Palmer is on practicing portion control and making smarter food choices, such as increasing vegetables and decreasing simple carbohydrates and states she is following her eating plan approximately 50-70% of the time. Madison Palmer states she is doing 0 minutes 0 times per week.  Today's visit was #: 63 Starting weight: 357 lbs Starting date: 02/27/2018 Today's weight: 289 lbs Today's date: 02/13/2023 Total lbs lost to date: 68 Total lbs lost since last in-office visit: 15  Interim History: Patient last appointment was 3 months ago.  In that time she has been seeing her oncologist and undergoing treatment for her stage 4 lung cancer.  She has been dealing with skin tightening and was started on doxycycline and then switched to bactrim.  She has ups and downs and sometime is experiencing fairly significant fatigue.  She feels like the biggest obstacle is taste change and appetite changes.  She really is forcing herself to eat.  She is trying to focus on protein when she is having to force herself to eat.  On  the days she has to force herself to eat sometimes she gravitates toward sweet foods.  She isn't getting many vegetables.   Subjective:   1. Essential hypertension Patient's blood pressure is elevated today.  She denies chest pain, chest pressure, or headache.  She is on Norvasc, lisinopril, and labetalol.  2. Elevated serum creatinine Patient's creatinine is 1.42 today, previously 1.15.  Assessment/Plan:   1. Essential hypertension Patient will continue her current medications with no change in dose.  We will follow-up on her blood pressure at her subsequent appointments.  2. Elevated serum creatinine Patient is to follow-up with Dr. Arbutus Ped.  3. BMI 50.0-59.9, adult (HCC)  4. Obesity with starting BMI of 61.2 Madison Palmer is  currently in the action stage of change. As such, her goal is to continue with weight loss efforts. She has agreed to practicing portion control and making smarter food choices, such as increasing vegetables and decreasing simple carbohydrates.   Exercise goals: No exercise has been prescribed at this time.  Behavioral modification strategies: increasing lean protein intake, meal planning and cooking strategies, keeping healthy foods in the home, and planning for success.  Madison Palmer has agreed to follow-up with our clinic in 6 weeks. She was informed of the importance of frequent follow-up visits to maximize her success with intensive lifestyle modifications for her multiple health conditions.   Objective:   Blood pressure (!) 143/76, pulse 78, temperature 98.6 F (37 C), height 5\' 2"  (1.575 m), weight 289 lb (131.1 kg), last menstrual period 05/02/2017, SpO2 95%. Body mass index is 52.86 kg/m.  General: Cooperative, alert, well developed, in no acute distress. HEENT: Conjunctivae and lids unremarkable. Cardiovascular: Regular rhythm.  Lungs: Normal work of breathing. Neurologic: No focal deficits.   Lab Results  Component Value Date   CREATININE 1.42 (H) 02/13/2023   BUN 11 02/13/2023   NA 140 02/13/2023   K 4.0 02/13/2023   CL 104 02/13/2023   CO2 29 02/13/2023   Lab Results  Component Value Date   ALT 9 02/13/2023   AST 18 02/13/2023   ALKPHOS 67 02/13/2023   BILITOT 0.5 02/13/2023   Lab Results  Component Value Date   HGBA1C 6.0 (A) 02/01/2023   HGBA1C 6.0 02/01/2023   HGBA1C 6.0 02/01/2023   HGBA1C 6.0 02/01/2023  HGBA1C 6.3 10/18/2022   Lab Results  Component Value Date   INSULIN 18.9 06/01/2020   INSULIN 11.4 12/11/2019   INSULIN 21.1 12/30/2018   INSULIN 9.6 02/27/2018   Lab Results  Component Value Date   TSH 1.975 02/13/2023   Lab Results  Component Value Date   CHOL 160 10/18/2022   HDL 47.10 10/18/2022   LDLCALC 86 10/18/2022   LDLDIRECT 94.0  04/18/2022   TRIG 132.0 10/18/2022   CHOLHDL 3 10/18/2022   Lab Results  Component Value Date   VD25OH 34.4 09/08/2021   VD25OH 29.0 (L) 09/01/2020   VD25OH 19.7 (L) 06/01/2020   Lab Results  Component Value Date   WBC 3.8 (L) 02/13/2023   HGB 10.0 (L) 02/13/2023   HCT 29.5 (L) 02/13/2023   MCV 102.4 (H) 02/13/2023   PLT 371 02/13/2023   Lab Results  Component Value Date   IRON 44 02/20/2018   TIBC 347 05/29/2017   Attestation Statements:   Reviewed by clinician on day of visit: allergies, medications, problem list, medical history, surgical history, family history, social history, and previous encounter notes.   I, Burt Knack, am acting as transcriptionist for Reuben Likes, MD.  I have reviewed the above documentation for accuracy and completeness, and I agree with the above. - Reuben Likes, MD

## 2023-02-14 LAB — T4: T4, Total: 11.9 ug/dL (ref 4.5–12.0)

## 2023-02-15 ENCOUNTER — Ambulatory Visit: Payer: BC Managed Care – PPO | Admitting: Urology

## 2023-02-16 ENCOUNTER — Other Ambulatory Visit: Payer: Self-pay | Admitting: Cardiology

## 2023-02-16 ENCOUNTER — Encounter: Payer: Self-pay | Admitting: Nurse Practitioner

## 2023-02-19 ENCOUNTER — Telehealth: Payer: Self-pay | Admitting: Internal Medicine

## 2023-02-19 NOTE — Telephone Encounter (Signed)
Called patient regarding upcoming October and November appointments, patient is notified.  

## 2023-02-27 ENCOUNTER — Encounter (HOSPITAL_BASED_OUTPATIENT_CLINIC_OR_DEPARTMENT_OTHER): Payer: Self-pay | Admitting: Pulmonary Disease

## 2023-02-27 ENCOUNTER — Telehealth: Payer: Self-pay | Admitting: Medical Oncology

## 2023-02-27 ENCOUNTER — Ambulatory Visit (HOSPITAL_BASED_OUTPATIENT_CLINIC_OR_DEPARTMENT_OTHER): Payer: BC Managed Care – PPO | Admitting: Pulmonary Disease

## 2023-02-27 VITALS — BP 118/80 | HR 74 | Ht 62.0 in | Wt 292.0 lb

## 2023-02-27 DIAGNOSIS — Z23 Encounter for immunization: Secondary | ICD-10-CM | POA: Diagnosis not present

## 2023-02-27 DIAGNOSIS — G4733 Obstructive sleep apnea (adult) (pediatric): Secondary | ICD-10-CM

## 2023-02-27 DIAGNOSIS — C3490 Malignant neoplasm of unspecified part of unspecified bronchus or lung: Secondary | ICD-10-CM

## 2023-02-27 DIAGNOSIS — I890 Lymphedema, not elsewhere classified: Secondary | ICD-10-CM

## 2023-02-27 NOTE — Progress Notes (Signed)
Subjective:    Patient ID: Madison Palmer, female    DOB: 1966/02/15, 57 y.o.   MRN: [id number]  HPI  57 yo morbidly obese never smoker with chronic lymphedema  for FU  of severe obstructive sleep apnea & pulm nodules. Noted 05/2018 -diagnosed with Stage IV non-small cell lung cancer, adenocarcinoma (T2a, N3, M1c). She presented with bilateral lung nodules, thoracic adenopathy and bilobar liver lesions.  -assistant professor of biology, teaching at [employer]    She underwent lap band in 2009 and lost weight and CPAP was then decreased to 9 cm in 06/2009.  lap band was removed in 2013.    PMH - laryngospasm with prior surgery Chronic diastolic heart failure   cardiac MRI was negative for amyloidosis, showed multiple pulmonary nodules  Previously pulmonary nodules were noted to be stable from 2020- 2022 and low-grade hypermetabolic on PET.    87-month follow-up visit. Unfortunately markers were negative and she was started on chemotherapy by Dr. Clover Mealy with carboplatin, Alimta and Rande Lawman.  She has been 7 cycles.  Last CT chest/abdomen/pelvis showed stable disease except some increase in liver lesions and there is concern for disease progression, PET scan is pending. She has lost weight from 305 2 to 92 pounds. She is still driving to work 45 minutes and does not miss many days due to chemotherapy.  She complains of pain in her right calf, was treated for cellulitis with Bactrim by her PCP.  She has a long history of lymphedema and is not able to get an appointment with vein and vascular Venous duplex was - 12/2022 for DVT  Significant tests/ events reviewed    12/2018 ONO showed SpO2 low 85%, baseline 91%. 8 mins <88% >>  continue O2 blended into CPAP   PFTs severe restriction due to obesity 01/2020-FVC 1.15 (34%), FEV1 0.88 (33%), ratio 76, DLCOco 162%   CT chest 06/2022 increased size of nodules compared to 2022, new mediastinal lymphadenopathy    CT chest 07/2020 multiple  pulmonary nodules, all stable CT chest 01/2020  stable pulmonary nodules, favoring benign cause CT angiogram 05/10/18  was negative for pulmonary emboli, but showed numerous pulmonary nodules largest 15 mm in the right lower lobe and 13 mm in the lingula. Mosaic attenuation was also noted  suggesting air trapping     prior CT chest from 2010 and 2011  >> shows multiple noncalcified pulmonary nodules 3 to 6 mm, largest being 6 mm    NP SG 2008 showed AHI of 100/hour which was corrected by CPAP of 14 cm.      rheumatology evaluation >> positive ANA and positive RA factor but negative CCP and no evidence of synovitis , ENA neg ACE 40  Review of Systems neg for any significant sore throat, dysphagia, itching, sneezing, nasal congestion or excess/ purulent secretions, fever, chills, sweats, unintended wt loss, pleuritic or exertional cp, hempoptysis, orthopnea pnd or change in chronic leg swelling. Also denies presyncope, palpitations, heartburn, abdominal pain, nausea, vomiting, diarrhea or change in bowel or urinary habits, dysuria,hematuria, rash, arthralgias, visual complaints, headache, numbness weakness or ataxia.      Objective:   Physical Exam  Gen. Pleasant, obese, in no distress ENT - no lesions, no post nasal drip Neck: No JVD, no thyromegaly, no carotid bruits Lungs: no use of accessory muscles, no dullness to percussion, decreased without rales or rhonchi  Cardiovascular: Rhythm regular, heart sounds  normal, no murmurs or gallops, no peripheral edema Musculoskeletal: No deformities, no cyanosis  or clubbing , no tremors       Assessment & Plan:

## 2023-02-27 NOTE — Assessment & Plan Note (Signed)
She complains of persistent pain in her right leg/calf.  She has been treated for cellulitis and venous duplex was - 12/2022. We will repeat venous duplex of this right leg

## 2023-02-27 NOTE — Telephone Encounter (Signed)
PET does not require authorization. I tried to call pt to tell her to call central scheduling,

## 2023-02-27 NOTE — Assessment & Plan Note (Signed)
She is compliant with CPAP, denies any problems with mask or pressure  Weight loss encouraged, compliance with goal of at least 4-6 hrs every night is the expectation. Advised against medications with sedative side effects Cautioned against driving when sleepy - understanding that sleepiness will vary on a day to day basis

## 2023-02-27 NOTE — Assessment & Plan Note (Signed)
Plan is for PET scan to see if there is disease progression and if so she may need change in chemotherapy regimen

## 2023-02-27 NOTE — Patient Instructions (Signed)
x flu shot today  x check venous Doppler to look for blood clot in the leg

## 2023-03-01 ENCOUNTER — Other Ambulatory Visit: Payer: Self-pay | Admitting: Nurse Practitioner

## 2023-03-01 ENCOUNTER — Ambulatory Visit: Payer: BC Managed Care – PPO | Admitting: Nurse Practitioner

## 2023-03-02 ENCOUNTER — Other Ambulatory Visit: Payer: Self-pay | Admitting: Nurse Practitioner

## 2023-03-02 DIAGNOSIS — Z1231 Encounter for screening mammogram for malignant neoplasm of breast: Secondary | ICD-10-CM

## 2023-03-06 ENCOUNTER — Inpatient Hospital Stay: Payer: BC Managed Care – PPO

## 2023-03-06 ENCOUNTER — Inpatient Hospital Stay: Payer: BC Managed Care – PPO | Attending: Physician Assistant

## 2023-03-06 ENCOUNTER — Inpatient Hospital Stay (HOSPITAL_BASED_OUTPATIENT_CLINIC_OR_DEPARTMENT_OTHER): Payer: BC Managed Care – PPO | Admitting: Internal Medicine

## 2023-03-06 ENCOUNTER — Encounter: Payer: Self-pay | Admitting: Internal Medicine

## 2023-03-06 DIAGNOSIS — C3492 Malignant neoplasm of unspecified part of left bronchus or lung: Secondary | ICD-10-CM

## 2023-03-06 DIAGNOSIS — Z5112 Encounter for antineoplastic immunotherapy: Secondary | ICD-10-CM | POA: Insufficient documentation

## 2023-03-06 DIAGNOSIS — Z5111 Encounter for antineoplastic chemotherapy: Secondary | ICD-10-CM | POA: Insufficient documentation

## 2023-03-06 DIAGNOSIS — C787 Secondary malignant neoplasm of liver and intrahepatic bile duct: Secondary | ICD-10-CM | POA: Diagnosis not present

## 2023-03-06 DIAGNOSIS — E538 Deficiency of other specified B group vitamins: Secondary | ICD-10-CM | POA: Insufficient documentation

## 2023-03-06 DIAGNOSIS — R634 Abnormal weight loss: Secondary | ICD-10-CM | POA: Insufficient documentation

## 2023-03-06 DIAGNOSIS — R5383 Other fatigue: Secondary | ICD-10-CM | POA: Insufficient documentation

## 2023-03-06 DIAGNOSIS — C3490 Malignant neoplasm of unspecified part of unspecified bronchus or lung: Secondary | ICD-10-CM | POA: Diagnosis present

## 2023-03-06 DIAGNOSIS — Z79620 Long term (current) use of immunosuppressive biologic: Secondary | ICD-10-CM | POA: Diagnosis not present

## 2023-03-06 DIAGNOSIS — R600 Localized edema: Secondary | ICD-10-CM | POA: Insufficient documentation

## 2023-03-06 LAB — CMP (CANCER CENTER ONLY)
ALT: 21 U/L (ref 0–44)
AST: 26 U/L (ref 15–41)
Albumin: 3.7 g/dL (ref 3.5–5.0)
Alkaline Phosphatase: 62 U/L (ref 38–126)
Anion gap: 6 (ref 5–15)
BUN: 15 mg/dL (ref 6–20)
CO2: 33 mmol/L — ABNORMAL HIGH (ref 22–32)
Calcium: 9.8 mg/dL (ref 8.9–10.3)
Chloride: 103 mmol/L (ref 98–111)
Creatinine: 1.16 mg/dL — ABNORMAL HIGH (ref 0.44–1.00)
GFR, Estimated: 55 mL/min — ABNORMAL LOW (ref >60)
Glucose, Bld: 123 mg/dL — ABNORMAL HIGH (ref 70–99)
Potassium: 3.8 mmol/L (ref 3.5–5.1)
Sodium: 142 mmol/L (ref 135–145)
Total Bilirubin: 0.5 mg/dL (ref <1.2)
Total Protein: 7.2 g/dL (ref 6.5–8.1)

## 2023-03-06 LAB — CBC WITH DIFFERENTIAL (CANCER CENTER ONLY)
Abs Immature Granulocytes: 0.04 10*3/uL (ref 0.00–0.07)
Basophils Absolute: 0 10*3/uL (ref 0.0–0.1)
Basophils Relative: 1 %
Eosinophils Absolute: 0.1 10*3/uL (ref 0.0–0.5)
Eosinophils Relative: 3 %
HCT: 27.5 % — ABNORMAL LOW (ref 36.0–46.0)
Hemoglobin: 9.5 g/dL — ABNORMAL LOW (ref 12.0–15.0)
Immature Granulocytes: 1 %
Lymphocytes Relative: 25 %
Lymphs Abs: 1 10*3/uL (ref 0.7–4.0)
MCH: 36.7 pg — ABNORMAL HIGH (ref 26.0–34.0)
MCHC: 34.5 g/dL (ref 30.0–36.0)
MCV: 106.2 fL — ABNORMAL HIGH (ref 80.0–100.0)
Monocytes Absolute: 0.5 10*3/uL (ref 0.1–1.0)
Monocytes Relative: 12 %
Neutro Abs: 2.3 10*3/uL (ref 1.7–7.7)
Neutrophils Relative %: 58 %
Platelet Count: 281 10*3/uL (ref 150–400)
RBC: 2.59 MIL/uL — ABNORMAL LOW (ref 3.87–5.11)
RDW: 17.9 % — ABNORMAL HIGH (ref 11.5–15.5)
WBC Count: 4 10*3/uL (ref 4.0–10.5)
nRBC: 0 % (ref 0.0–0.2)

## 2023-03-06 LAB — TSH: TSH: 1.604 u[IU]/mL (ref 0.350–4.500)

## 2023-03-06 MED ORDER — SODIUM CHLORIDE 0.9 % IV SOLN: Freq: Once | INTRAVENOUS | Status: AC

## 2023-03-06 MED ORDER — PROCHLORPERAZINE MALEATE 10 MG PO TABS
10.0000 mg | ORAL_TABLET | Freq: Once | ORAL | Status: AC
  Administered 2023-03-06: 10 mg via ORAL
  Filled 2023-03-06: qty 1

## 2023-03-06 MED ORDER — SODIUM CHLORIDE 0.9 % IV SOLN
500.0000 mg/m2 | Freq: Once | INTRAVENOUS | Status: AC
  Administered 2023-03-06: 1200 mg via INTRAVENOUS
  Filled 2023-03-06: qty 40

## 2023-03-06 MED ORDER — SODIUM CHLORIDE 0.9 % IV SOLN
200.0000 mg | Freq: Once | INTRAVENOUS | Status: AC
  Administered 2023-03-06: 200 mg via INTRAVENOUS
  Filled 2023-03-06: qty 200

## 2023-03-06 MED ORDER — CYANOCOBALAMIN 1000 MCG/ML IJ SOLN
1000.0000 ug | Freq: Once | INTRAMUSCULAR | Status: AC
  Administered 2023-03-06: 1000 ug via INTRAMUSCULAR
  Filled 2023-03-06: qty 1

## 2023-03-06 NOTE — Patient Instructions (Signed)
[facility] - A DEPT OF [facility]  Discharge Instructions: Thank you for choosing [facility] to provide your oncology and hematology care.   If you have a lab appointment with the Cancer Center, please go directly to the Cancer Center and check in at the registration area.   Wear comfortable clothing and clothing appropriate for easy access to any Portacath or PICC line.   We strive to give you quality time with your provider. You may need to reschedule your appointment if you arrive late (15 or more minutes).  Arriving late affects you and other patients whose appointments are after yours.  Also, if you miss three or more appointments without notifying the office, you may be dismissed from the clinic at the provider's discretion.      For prescription refill requests, have your pharmacy contact our office and allow 72 hours for refills to be completed.    Today you received the following chemotherapy and/or immunotherapy agents Keytruda / Alimta      To help prevent nausea and vomiting after your treatment, we encourage you to take your nausea medication as directed.  BELOW ARE SYMPTOMS THAT SHOULD BE REPORTED IMMEDIATELY: *FEVER GREATER THAN 100.4 F (38 C) OR HIGHER *CHILLS OR SWEATING *NAUSEA AND VOMITING THAT IS NOT CONTROLLED WITH YOUR NAUSEA MEDICATION *UNUSUAL SHORTNESS OF BREATH *UNUSUAL BRUISING OR BLEEDING *URINARY PROBLEMS (pain or burning when urinating, or frequent urination) *BOWEL PROBLEMS (unusual diarrhea, constipation, pain near the anus) TENDERNESS IN MOUTH AND THROAT WITH OR WITHOUT PRESENCE OF ULCERS (sore throat, sores in mouth, or a toothache) UNUSUAL RASH, SWELLING OR PAIN  UNUSUAL VAGINAL DISCHARGE OR ITCHING   Items with * indicate a potential emergency and should be followed up as soon as possible or go to the Emergency Department if any problems should occur.  Please show the CHEMOTHERAPY ALERT CARD or  IMMUNOTHERAPY ALERT CARD at check-in to the Emergency Department and triage nurse.  Should you have questions after your visit or need to cancel or reschedule your appointment, please contact [facility] - A DEPT OF [facility]  Dept: [phone]  and follow the prompts.  Office hours are 8:00 a.m. to 4:30 p.m. Monday - Friday. Please note that voicemails left after 4:00 p.m. may not be returned until the following business day.  We are closed weekends and major holidays. You have access to a nurse at all times for urgent questions. Please call the main number to the clinic Dept: [phone] and follow the prompts.   For any non-urgent questions, you may also contact your provider using MyChart. We now offer e-Visits for anyone 98 and older to request care online for non-urgent symptoms. For details visit [email or web address].   Also download the MyChart app! Go to the app store, search "MyChart", open the app, select [facility], and log in with your MyChart username and password.

## 2023-03-06 NOTE — Progress Notes (Signed)
[facility] [phone]) [phone]   [phone]  OFFICE PROGRESS NOTE  Madison Palmer, Madison Gains, NP [address] [place] [address]   DIAGNOSIS: Stage IV non-small cell lung cancer, adenocarcinoma (T2a, N3, M1c).  She presented with bilateral lung nodules, thoracic adenopathy and bilobar liver lesions.  She was diagnosed in May 2024.     PDL1: 0   Guardant 360: Negative for any actionable mutations   Foundation One: No actionable mutations    PRIOR THERAPY: None   CURRENT THERAPY: Palliative systemic chemotherapy and immunotherapy with carboplatin for an AUC of 5, alimta 500 mg/m2, and Keytruda 200 mg IV every 3 weeks. First dose on 10/10/22. Status post 6 cycles.  Starting from cycle #5 the patient is on maintenance treatment with Alimta and Keytruda every 3 weeks  INTERVAL HISTORY: XOIE KREUSER 57 y.o. female returns to clinic today for follow-up visit accompanied by her son. Discussed the use of AI scribe software for clinical note transcription with the patient, who gave verbal consent to proceed.  History of Present Illness   The patient, diagnosed with stage 4 Non-small cell lung cancer, adenocarcinoma with liver metastases, was scheduled for a PET scan prior to this visit. However, due to a delay in scheduling, the scan is now set for the following week. The patient reports no nausea or vomiting, but experiences fluctuating appetite and changes in taste perception. Some days, she eats once and feels satisfied, while on others, she has little desire to eat. This has resulted in a weight loss of six pounds since the last visit.  The patient also reports varying levels of fatigue. On some days, she feels energetic, while on others, she feels significantly tired. She is unsure if this fatigue is a side effect of the chemotherapy or a symptom of the cancer itself. She notes that she felt healthier prior to the May 30th diagnosis, despite the  cancer having already spread at that time. The patient expresses a desire to continue chemotherapy, despite questioning its potential contribution to her fatigue.        MEDICAL HISTORY: Past Medical History:  Diagnosis Date   Acute on chronic respiratory failure with hypoxia and hypercapnia (HCC) 09/15/2018   Anemia    Anxiety    Arrhythmia    tachycardia   Arthritis    Bipolar 1 disorder (HCC) 09/12/2018   Bipolar I disorder, current or most recent episode manic, with psychotic features (HCC)    Brief psychotic disorder (HCC) 08/22/2018   Chronic respiratory failure with hypoxia (HCC) 05/10/2018   Formatting of this note might be different from the original. Last Assessment & Plan:  Continue 1 L continuous on exertion and during sleep. On her next visit we will check OSA on CPAP/room air to decide whether she needs to take oxygen along with her on her cruise in May   Depression    Diverticulitis    Generalized edema 03/12/2018   GERD (gastroesophageal reflux disease)    Glaucoma    Hyperlipidemia    Hyperparathyroidism (HCC)    Hypertension    Inflammatory polyps of colon (HCC)    Leg swelling 01/14/2019   LVH (left ventricular hypertrophy)    Lymphedema    Morbid (severe) obesity due to excess calories (HCC) 05/11/2017   PCOS (polycystic ovarian syndrome)    Prediabetes    Recurrent UTI    Sleep apnea    CPAP   Thyroid disease  Vitamin D deficiency     ALLERGIES:  is allergic to other, fentanyl, levofloxacin, midazolam, pollen extract, atorvastatin, hydralazine hcl, and rosuvastatin.  MEDICATIONS:  Current Outpatient Medications  Medication Sig Dispense Refill   acetaminophen (TYLENOL) 500 MG tablet Take 1,000 mg by mouth 3 (three) times daily as needed for moderate pain or headache.     amLODipine (NORVASC) 10 MG tablet TAKE 1 TABLET BY MOUTH EVERY DAY 90 tablet 3   atorvastatin (LIPITOR) 20 MG tablet TAKE 1 TABLET(20 MG) BY MOUTH 3 TIMES A WEEK 12 tablet 5    benztropine (COGENTIN) 0.5 MG tablet Take 1 tablet (0.5 mg total) by mouth at bedtime. 30 tablet 2   cetirizine (ZYRTEC) 10 MG tablet TAKE 1 TABLET BY MOUTH EVERY DAY 90 tablet 1   Cholecalciferol (VITAMIN D3 MAXIMUM STRENGTH) 125 MCG (5000 UT) capsule Take 5,000 Units by mouth daily.     cinacalcet (SENSIPAR) 30 MG tablet Take 30 mg by mouth daily.     cloNIDine (CATAPRES) 0.1 MG tablet TAKE 1 TABLET(0.1 MG) BY MOUTH THREE TIMES DAILY 90 tablet 3   esomeprazole (NEXIUM) 40 MG capsule TAKE 1 CAPSULE BY MOUTH TWICE DAILY 180 capsule 2   fenofibrate (TRICOR) 145 MG tablet TAKE 1 TABLET(145 MG) BY MOUTH DAILY 90 tablet 3   furosemide (LASIX) 40 MG tablet TAKE 1 TABLET BY MOUTH EVERY DAY 90 tablet 3   gabapentin (NEURONTIN) 100 MG capsule TAKE 2 CAPSULES(200 MG) BY MOUTH AT BEDTIME 180 capsule 3   labetalol (NORMODYNE) 300 MG tablet TAKE 1.5 TABLETS BY MOUTH TWICE DAILY 270 tablet 3   levothyroxine (SYNTHROID) 88 MCG tablet Take 88 mcg by mouth daily.     lisinopril (ZESTRIL) 40 MG tablet TAKE 1 TABLET BY MOUTH EVERY DAY 90 tablet 1   lurasidone (LATUDA) 40 MG TABS tablet Take 1 tablet (40 mg total) by mouth daily with breakfast. 30 tablet 2   Polyethylene Glycol 3350 (MIRALAX PO) Take 1 Capful by mouth daily as needed (Constipation).     Potassium Chloride ER 20 MEQ TBCR TAKE 1 TABLET BY MOUTH EVERY DAY 90 tablet 2   prochlorperazine (COMPAZINE) 10 MG tablet Take 1 tablet (10 mg total) by mouth every 6 (six) hours as needed. 30 tablet 2   TART CHERRY PO Take 1 tablet by mouth daily.     No current facility-administered medications for this visit.    SURGICAL HISTORY:  Past Surgical History:  Procedure Laterality Date   BIOPSY  09/27/2022   Procedure: BIOPSY;  Surgeon: Mansouraty, Netty Starring., MD;  Location: [facility] ENDOSCOPY;  Service: Gastroenterology;;   BREAST BIOPSY  2015   CESAREAN SECTION  2004   CHOLECYSTECTOMY     COLONOSCOPY WITH PROPOFOL N/A 09/27/2022   Procedure: COLONOSCOPY WITH  PROPOFOL;  Surgeon: Lemar Lofty., MD;  Location: [facility] ENDOSCOPY;  Service: Gastroenterology;  Laterality: N/A;   ESOPHAGOGASTRODUODENOSCOPY (EGD) WITH PROPOFOL N/A 09/27/2022   Procedure: ESOPHAGOGASTRODUODENOSCOPY (EGD) WITH PROPOFOL;  Surgeon: Meridee Score Netty Starring., MD;  Location: [facility] ENDOSCOPY;  Service: Gastroenterology;  Laterality: N/A;   INNER EAR SURGERY     ear and sinus surgery   LAPAROSCOPIC REPAIR AND REMOVAL OF GASTRIC BAND     MALONEY DILATION  09/27/2022   Procedure: MALONEY DILATION;  Surgeon: Lemar Lofty., MD;  Location: [facility] ENDOSCOPY;  Service: Gastroenterology;;   OOPHORECTOMY Left    POLYPECTOMY  09/27/2022   Procedure: POLYPECTOMY;  Surgeon: Lemar Lofty., MD;  Location: [facility] ENDOSCOPY;  Service: Gastroenterology;;  TONSILLECTOMY AND ADENOIDECTOMY      REVIEW OF SYSTEMS:  Constitutional: positive for fatigue and weight loss Eyes: negative Ears, nose, mouth, throat, and face: negative Respiratory: negative Cardiovascular: negative Gastrointestinal: negative Genitourinary:negative Integument/breast: negative Hematologic/lymphatic: negative Musculoskeletal:negative Neurological: negative Behavioral/Psych: negative Endocrine: negative Allergic/Immunologic: negative   PHYSICAL EXAMINATION: General appearance: alert, cooperative, fatigued, and no distress Head: Normocephalic, without obvious abnormality, atraumatic Neck: no adenopathy, no JVD, supple, symmetrical, trachea midline, and thyroid not enlarged, symmetric, no tenderness/mass/nodules Lymph nodes: Cervical, supraclavicular, and axillary nodes normal. Resp: clear to auscultation bilaterally Back: symmetric, no curvature. ROM normal. No CVA tenderness. Cardio: regular rate and rhythm, S1, S2 normal, no murmur, click, rub or gallop GI: soft, non-tender; bowel sounds normal; no masses,  no organomegaly Extremities: edema 1+ and small area of erythema in the right leg. Neurologic:  Alert and oriented X 3, normal strength and tone. Normal symmetric reflexes. Normal coordination and gait  ECOG PERFORMANCE STATUS: 1 - Symptomatic but completely ambulatory  Blood pressure 130/79, pulse 75, temperature 98.2 F (36.8 C), temperature source Oral, resp. rate 17, height 5\' 2"  (1.575 m), weight 286 lb (129.7 kg), last menstrual period 05/02/2017, SpO2 96%.  LABORATORY DATA: Lab Results  Component Value Date   WBC 4.0 03/06/2023   HGB 9.5 (L) 03/06/2023   HCT 27.5 (L) 03/06/2023   MCV 106.2 (H) 03/06/2023   PLT 281 03/06/2023      Chemistry      Component Value Date/Time   NA 142 03/06/2023 1122   NA 145 09/08/2021 0000   K 3.8 03/06/2023 1122   CL 103 03/06/2023 1122   CO2 33 (H) 03/06/2023 1122   BUN 15 03/06/2023 1122   BUN 36 (H) 08/22/2021 1606   CREATININE 1.16 (H) 03/06/2023 1122   CREATININE 1.13 (H) 12/24/2018 1402   GLU 97 09/08/2021 0000      Component Value Date/Time   CALCIUM 9.8 03/06/2023 1122   ALKPHOS 62 03/06/2023 1122   AST 26 03/06/2023 1122   ALT 21 03/06/2023 1122   BILITOT 0.5 03/06/2023 1122       RADIOGRAPHIC STUDIES: CT Chest W Contrast  Result Date: 02/12/2023 CLINICAL DATA:  Bronchogenic carcinoma with liver metastasis. Assess treatment response. * Tracking Code: BO * EXAM: CT CHEST, ABDOMEN, AND PELVIS WITH CONTRAST TECHNIQUE: Multidetector CT imaging of the chest, abdomen and pelvis was performed following the standard protocol during bolus administration of intravenous contrast. RADIATION DOSE REDUCTION: This exam was performed according to the departmental dose-optimization program which includes automated exposure control, adjustment of the mA and/or kV according to patient size and/or use of iterative reconstruction technique. CONTRAST:  OMNIPAQUE IOHEXOL 300 MG/ML  SOLN COMPARISON:  CT 12/06/2022 com PET-CT 08/21/2022 FINDINGS: CT CHEST FINDINGS Cardiovascular: No significant vascular findings. Normal heart size. No  pericardial effusion. Mediastinum/Nodes: Mildly enlarged mediastinal lymph nodes similar prior. Example subcarinal node measures 18 mm compared to 17 mm. LEFT hilar node measures 16 mm compared to 23 mm. No new adenopathy. Lungs/Pleura: Again demonstrated bilateral pulmonary nodules and LEFT lobe pulmonary mass. The LEFT lobe mass measures 3.7 by 2.5 cm (image 80/5) compared to 3.8 x 2.7 cm for no significant change. Example RIGHT lower lobe nodule measures 12 mm (image 73/5) compared to 14 mm. Example RIGHT upper lobe nodule measuring 8 mm (image 33/5) compares to 6 mm. Example LEFT upper lobe nodule measures 7 mm (image 46) compared to 8 mm. No new nodules identified. Musculoskeletal: Degenerative osteophytosis of the spine. CT ABDOMEN  AND PELVIS FINDINGS Hepatobiliary: Multifocal hepatic metastasis again noted. Lesions are more clearly demarcated on current exam . Lesion central liver measuring 3.2 cm (image 45/3) compared to 2.8 mm. Lesion LEFT hepatic lobe measuring 2.5 cm (image 42) compares to 2.3 cm Lesions subcapsular RIGHT hepatic lobe measuring 4.6 cm (image 48) si poorly defined on prior measuring approximately 3 cm on prior. Pancreas: Pancreas is normal. No ductal dilatation. No pancreatic inflammation. Spleen: Normal spleen Adrenals/urinary tract: Thickening of the LEFT adrenal gland unchanged. Kidneys, ureters and bladder normal. Stomach/Bowel: Stomach, small bowel, appendix, and cecum are normal. The colon and rectosigmoid colon are normal. Vascular/Lymphatic: Abdominal aorta is normal caliber. There is no retroperitoneal or periportal lymphadenopathy. No pelvic lymphadenopathy. Reproductive: Uterus and adnexa unremarkable. Other: No free fluid. Musculoskeletal: No aggressive osseous lesion. IMPRESSION: CHEST: 1. Stable LEFT lobe pulmonary mass and bilateral pulmonary nodules. 2. Stable mediastinal and LEFT hilar adenopathy. PELVIS: 1. Concern for progression of hepatic metastasis. Lesions are more  conspicuous than most recent comparison CT scan which may in part be due to technique but concern for progression of hepatic metastasis. 2. No new sites of metastatic disease in the abdomen pelvis. Electronically Signed   By: Genevive Bi M.D.   On: 02/12/2023 17:07   CT ABDOMEN PELVIS W CONTRAST  Result Date: 02/12/2023 CLINICAL DATA:  Bronchogenic carcinoma with liver metastasis. Assess treatment response. * Tracking Code: BO * EXAM: CT CHEST, ABDOMEN, AND PELVIS WITH CONTRAST TECHNIQUE: Multidetector CT imaging of the chest, abdomen and pelvis was performed following the standard protocol during bolus administration of intravenous contrast. RADIATION DOSE REDUCTION: This exam was performed according to the departmental dose-optimization program which includes automated exposure control, adjustment of the mA and/or kV according to patient size and/or use of iterative reconstruction technique. CONTRAST:  OMNIPAQUE IOHEXOL 300 MG/ML  SOLN COMPARISON:  CT 12/06/2022 com PET-CT 08/21/2022 FINDINGS: CT CHEST FINDINGS Cardiovascular: No significant vascular findings. Normal heart size. No pericardial effusion. Mediastinum/Nodes: Mildly enlarged mediastinal lymph nodes similar prior. Example subcarinal node measures 18 mm compared to 17 mm. LEFT hilar node measures 16 mm compared to 23 mm. No new adenopathy. Lungs/Pleura: Again demonstrated bilateral pulmonary nodules and LEFT lobe pulmonary mass. The LEFT lobe mass measures 3.7 by 2.5 cm (image 80/5) compared to 3.8 x 2.7 cm for no significant change. Example RIGHT lower lobe nodule measures 12 mm (image 73/5) compared to 14 mm. Example RIGHT upper lobe nodule measuring 8 mm (image 33/5) compares to 6 mm. Example LEFT upper lobe nodule measures 7 mm (image 46) compared to 8 mm. No new nodules identified. Musculoskeletal: Degenerative osteophytosis of the spine. CT ABDOMEN AND PELVIS FINDINGS Hepatobiliary: Multifocal hepatic metastasis again noted.  Lesions are more clearly demarcated on current exam . Lesion central liver measuring 3.2 cm (image 45/3) compared to 2.8 mm. Lesion LEFT hepatic lobe measuring 2.5 cm (image 42) compares to 2.3 cm Lesions subcapsular RIGHT hepatic lobe measuring 4.6 cm (image 48) si poorly defined on prior measuring approximately 3 cm on prior. Pancreas: Pancreas is normal. No ductal dilatation. No pancreatic inflammation. Spleen: Normal spleen Adrenals/urinary tract: Thickening of the LEFT adrenal gland unchanged. Kidneys, ureters and bladder normal. Stomach/Bowel: Stomach, small bowel, appendix, and cecum are normal. The colon and rectosigmoid colon are normal. Vascular/Lymphatic: Abdominal aorta is normal caliber. There is no retroperitoneal or periportal lymphadenopathy. No pelvic lymphadenopathy. Reproductive: Uterus and adnexa unremarkable. Other: No free fluid. Musculoskeletal: No aggressive osseous lesion. IMPRESSION: CHEST: 1. Stable LEFT lobe pulmonary mass  and bilateral pulmonary nodules. 2. Stable mediastinal and LEFT hilar adenopathy. PELVIS: 1. Concern for progression of hepatic metastasis. Lesions are more conspicuous than most recent comparison CT scan which may in part be due to technique but concern for progression of hepatic metastasis. 2. No new sites of metastatic disease in the abdomen pelvis. Electronically Signed   By: Genevive Bi M.D.   On: 02/12/2023 17:07    ASSESSMENT AND PLAN: This is a very pleasant 57 years old African-American female with stage IV non-small cell lung cancer, adenocarcinoma (T2a, N3, M1c).  She presented with bilateral lung nodules, thoracic adenopathy and bilobar liver lesions.  She was diagnosed in May 2024.   She has no actionable mutations and PD-L1 expression was negative. The patient is currently undergoing systemic chemoimmunotherapy with carboplatin for AUC 5, Alimta 500 Mg/M2 and Keytruda 200 Mg IV every 3 weeks status post 7 cycles.  Starting from cycle #5 she is on  maintenance treatment with Alimta and Keytruda every 3 weeks.  She has been tolerating this treatment fairly well.     Stage 4  Non-small cell lung cancer, adenocarcinoma with liver metastases Patient is currently undergoing chemotherapy initially with carboplatin, Alimta and Keytruda for 4 cycles followed by maintenance treatment with Alimta and Keytruda for a total of 7 cycles.. Reports variable appetite, altered taste, and intermittent fatigue. Weight loss of 6 pounds since last visit. No nausea or vomiting. -Continue current chemotherapy regimen. -Encourage regular exercise to combat fatigue. -Monitor weight and nutritional intake closely.  Pending PET Scan Scan was ordered to assess liver abnormalities but has been delayed. -Ensure PET scan is completed on scheduled date (next Thursday). -Results of PET scan will guide future treatment decisions.  General Health Maintenance -Encourage proactive communication with radiology department for future scan scheduling. -Continue to monitor blood counts for treatment tolerance.   Patient was advised to call immediately if she has any other concerning symptoms in the interval. The patient voices understanding of current disease status and treatment options and is in agreement with the current care plan.  All questions were answered. The patient knows to call the clinic with any problems, questions or concerns. We can certainly see the patient much sooner if necessary.  The total time spent in the appointment was 30 minutes.  Disclaimer: This note was dictated with voice recognition software. Similar sounding words can inadvertently be transcribed and may not be corrected upon review.

## 2023-03-07 LAB — T4: T4, Total: 12 ug/dL (ref 4.5–12.0)

## 2023-03-08 ENCOUNTER — Encounter: Payer: Self-pay | Admitting: Urology

## 2023-03-08 ENCOUNTER — Ambulatory Visit (INDEPENDENT_AMBULATORY_CARE_PROVIDER_SITE_OTHER): Payer: BC Managed Care – PPO | Admitting: Urology

## 2023-03-08 VITALS — BP 136/81 | HR 76 | Ht 64.0 in | Wt 286.0 lb

## 2023-03-08 DIAGNOSIS — R399 Unspecified symptoms and signs involving the genitourinary system: Secondary | ICD-10-CM

## 2023-03-08 LAB — BLADDER SCAN AMB NON-IMAGING

## 2023-03-08 NOTE — Progress Notes (Signed)
Assessment: 1. Lower urinary tract symptoms (LUTS)      Plan: Today had a long discussion with the patient and her son.  As noted her urinalysis is entirely clear and she empties completely.  I think that her symptoms are just functional related to her forcing fluids.  She does not think her symptoms warrant prescription medication. Follow-up as needed  Chief Complaint: LUTS  History of Present Illness:  Madison Palmer is a 57 y.o. female with stage IV non-small cell lung cancer currently receiving palliative systemic chemotherapy and immunotherapy who is seen in consultation from [name], [name], NP for evaluation of LUTS. Patient reports that over the last several months she has had some increased urgency and frequency and states that she has marked increase in her urinary flow when she voids.  This coincides with her forcing hydration as requested by her oncologist treating her lung cancer.  She drinks 2-1/2 to 3 L of water a day and prior to her diagnosis did not drink much water at all.  Urinalysis today is entirely clear PVR today = 0 mL   Past Medical History:  Past Medical History:  Diagnosis Date   Acute on chronic respiratory failure with hypoxia and hypercapnia (HCC) 09/15/2018   Anemia    Anxiety    Arrhythmia    tachycardia   Arthritis    Bipolar 1 disorder (HCC) 09/12/2018   Bipolar I disorder, current or most recent episode manic, with psychotic features (HCC)    Brief psychotic disorder (HCC) 08/22/2018   Chronic respiratory failure with hypoxia (HCC) 05/10/2018   Formatting of this note might be different from the original. Last Assessment & Plan:  Continue 1 L continuous on exertion and during sleep. On her next visit we will check OSA on CPAP/room air to decide whether she needs to take oxygen along with her on her cruise in May   Depression    Diverticulitis    Generalized edema 03/12/2018   GERD (gastroesophageal reflux disease)    Glaucoma     Hyperlipidemia    Hyperparathyroidism (HCC)    Hypertension    Inflammatory polyps of colon (HCC)    Leg swelling 01/14/2019   LVH (left ventricular hypertrophy)    Lymphedema    Morbid (severe) obesity due to excess calories (HCC) 05/11/2017   PCOS (polycystic ovarian syndrome)    Prediabetes    Recurrent UTI    Sleep apnea    CPAP   Thyroid disease    Vitamin D deficiency     Past Surgical History:  Past Surgical History:  Procedure Laterality Date   BIOPSY  09/27/2022   Procedure: BIOPSY;  Surgeon: Lemar Lofty., MD;  Location[facility] END[facility]COPY;  Service: Gastroenterology;;   BREAST BIOPSY  2015   CESAREAN SECTION  2004   CHOLECYSTECTOMY     COLONOSCOPY WITH PROPOFOL N/A 09/27/2022   Procedure: COLONOSCOPY WITH PROPOFOL;  Surgeon: Lemar Lofty., MD;  Location: [facility] ENDOSCOPY;  Service: Gastroenterology;  Laterality: N/A;   ESOPHAGOGASTRODUODENOSCOPY (EGD) WITH PROPOFOL N/A 09/27/2022   Procedure: ESOPHAGOGASTRODUODENOSCOPY (EGD) WITH PROPOFOL;  Surgeon: Meridee Score Netty Starring., MD;  Location: [facility] ENDOSCOPY;  Service: Gastroenterology;  Laterality: N/A;   INNER EAR SURGERY     ear and sinus surgery   LAPAROSCOPIC REPAIR AND REMOVAL OF GASTRIC BAND     MALONEY DILATION  09/27/2022   Procedure: MALONEY DILATION;  Surgeon: Lemar Lofty., MD;  Location: [facility] ENDOSCOPY;  Service: Gastroenterology;;   OOPHORECTOMY Left  POLYPECTOMY  09/27/2022   Procedure: POLYPECTOMY;  Surgeon: Mansouraty, Netty Starring., MD;  Location: [facility] ENDOSCOPY;  Service: Gastroenterology;;   TONSILLECTOMY AND ADENOIDECTOMY      Allergies:  Allergies  Allergen Reactions   Other Other (See Comments)    Hydralazine Hcl Other reaction(s): Other (See Comments) Instructed not to take by Cardiology.   Fentanyl Hives    Other reaction(s): hives had fentanyl recently with benadryl, did OK   Levofloxacin Hives   Midazolam Hives   Pollen Extract     seasonal   Atorvastatin Other (See  Comments)    Muscle pain in legs  Abdominal pain   Hydralazine Hcl Other (See Comments)    Hypercalcemia    Rosuvastatin Other (See Comments)    Abdominal pain Other reaction(s): diarrhea    Family History:  Family History  Adopted: Yes  Problem Relation Age of Onset   Hearing loss Son        right    Healthy Son    Breast cancer Neg Hx     Social History:  Social History   Tobacco Use   Smoking status: Never   Smokeless tobacco: Never  Vaping Use   Vaping status: Never Used  Substance Use Topics   Alcohol use: Yes    Comment: social   Drug use: No    Review of symptoms:  Constitutional:  Negative for unexplained weight loss, night sweats, fever, chills ENT:  Negative for nose bleeds, sinus pain, painful swallowing CV:  Negative for chest pain, shortness of breath, exercise intolerance, palpitations, loss of consciousness Resp:  Negative for cough, wheezing, shortness of breath GI:  Negative for nausea, vomiting, diarrhea, bloody stools GU:  Positives noted in HPI; otherwise negative for gross hematuria, dysuria, urinary incontinence Neuro:  Negative for seizures, poor balance, limb weakness, slurred speech Psych:  Negative for lack of energy, depression, anxiety Endocrine:  Negative for polydipsia, polyuria, symptoms of hypoglycemia (dizziness, hunger, sweating) Hematologic:  Negative for anemia, purpura, petechia, prolonged or excessive bleeding, use of anticoagulants  Allergic:  Negative for difficulty breathing or choking as a result of exposure to anything; no shellfish allergy; no allergic response (rash/itch) to materials, foods  Physical exam: BP 136/81   Pulse 76   Ht 5\' 4"  (1.626 m)   Wt 286 lb (129.7 kg)   LMP 05/02/2017   BMI 49.09 kg/m  GENERAL APPEARANCE:  obese bf, NAD    Results: UA clear

## 2023-03-12 LAB — URINALYSIS, ROUTINE W REFLEX MICROSCOPIC
Bilirubin, UA: NEGATIVE
Glucose, UA: NEGATIVE
Ketones, UA: NEGATIVE
Leukocytes,UA: NEGATIVE
Nitrite, UA: NEGATIVE
Protein,UA: NEGATIVE
RBC, UA: NEGATIVE
Specific Gravity, UA: 1.015 (ref 1.005–1.030)
Urobilinogen, Ur: 0.2 mg/dL (ref 0.2–1.0)
pH, UA: 7 (ref 5.0–7.5)

## 2023-03-15 ENCOUNTER — Encounter (HOSPITAL_COMMUNITY)
Admission: RE | Admit: 2023-03-15 | Discharge: 2023-03-15 | Disposition: A | Discharge status: Home / Self Care | Payer: BC Managed Care – PPO | Source: Ambulatory Visit | Attending: Internal Medicine | Admitting: Internal Medicine

## 2023-03-15 ENCOUNTER — Ambulatory Visit: Payer: BC Managed Care – PPO | Admitting: Nurse Practitioner

## 2023-03-15 ENCOUNTER — Encounter: Payer: Self-pay | Admitting: Nurse Practitioner

## 2023-03-15 VITALS — BP 120/68 | HR 71 | Temp 97.8°F | Resp 18

## 2023-03-15 DIAGNOSIS — C3490 Malignant neoplasm of unspecified part of unspecified bronchus or lung: Secondary | ICD-10-CM | POA: Insufficient documentation

## 2023-03-15 DIAGNOSIS — L03115 Cellulitis of right lower limb: Secondary | ICD-10-CM | POA: Diagnosis not present

## 2023-03-15 DIAGNOSIS — C787 Secondary malignant neoplasm of liver and intrahepatic bile duct: Secondary | ICD-10-CM | POA: Insufficient documentation

## 2023-03-15 DIAGNOSIS — I890 Lymphedema, not elsewhere classified: Secondary | ICD-10-CM | POA: Diagnosis not present

## 2023-03-15 DIAGNOSIS — C3492 Malignant neoplasm of unspecified part of left bronchus or lung: Secondary | ICD-10-CM | POA: Insufficient documentation

## 2023-03-15 DIAGNOSIS — C771 Secondary and unspecified malignant neoplasm of intrathoracic lymph nodes: Secondary | ICD-10-CM | POA: Insufficient documentation

## 2023-03-15 LAB — GLUCOSE, CAPILLARY: Glucose-Capillary: 97 mg/dL (ref 70–99)

## 2023-03-15 MED ORDER — FLUDEOXYGLUCOSE F - 18 (FDG) INJECTION
14.2200 | Freq: Once | INTRAVENOUS | Status: AC
  Administered 2023-03-15: 14.22 via INTRAVENOUS

## 2023-03-15 NOTE — Patient Instructions (Signed)
continue leg elevation as much as possible Call vascular office for possible earlier appointment. Use gabapentin 100mg  in AM and 200mg  in PM if no daytime somnolence.

## 2023-03-15 NOTE — Assessment & Plan Note (Addendum)
Resolved redness  Reports Mild swelling and intermittent pain Unable to tolerate compression garment at home Has appointment with vein and vascular specialist 04/12/23  Advised to continue leg elevation as much as possible Call vascular office for possible earlier appointment. Use gabapentin 100mg  in AM and 200mg  in PM if no daytime somnolence No need for oral abx at this time

## 2023-03-15 NOTE — Progress Notes (Signed)
Established Patient Visit  Patient: Madison Palmer   DOB: Sep 04, 1965   57 y.o. Female  MRN: [id number] Visit Date: 03/15/2023  Subjective:    Chief Complaint  Patient presents with   OFFICE VISIT     Follow up RT leg pain. PT voiced pain has decreased but is still present. PT is due for eye exam    HPI Cellulitis of right lower extremity Resolved redness  Reports Mild swelling and intermittent pain Unable to tolerate compression garment at home Has appointment with vein and vascular specialist 04/12/23  Advised to continue leg elevation as much as possible Call vascular office for possible earlier appointment. Use gabapentin 100mg  in AM and 200mg  in PM if no daytime somnolence No need for oral abx at this time  Lymphatic edema Improved Left LE edema with residual hyperpigmentation. Reports Mild swelling and intermittent pain. No erythema or increased warmth. Unable to tolerate compression garment at home. Advised to maintain upcoming appointment with vein and vascular specialist.  Wt Readings from Last 3 Encounters:  03/08/23 286 lb (129.7 kg)  03/06/23 286 lb (129.7 kg)  02/27/23 292 lb (132.5 kg)    Reviewed medical, surgical, and social history today  Medications: Outpatient Medications Prior to Visit  Medication Sig   acetaminophen (TYLENOL) 500 MG tablet Take 1,000 mg by mouth 3 (three) times daily as needed for moderate pain or headache.   amLODipine (NORVASC) 10 MG tablet TAKE 1 TABLET BY MOUTH EVERY DAY   atorvastatin (LIPITOR) 20 MG tablet TAKE 1 TABLET(20 MG) BY MOUTH 3 TIMES A WEEK   benztropine (COGENTIN) 0.5 MG tablet Take 1 tablet (0.5 mg total) by mouth at bedtime.   cetirizine (ZYRTEC) 10 MG tablet TAKE 1 TABLET BY MOUTH EVERY DAY   Cholecalciferol (VITAMIN D3 MAXIMUM STRENGTH) 125 MCG (5000 UT) capsule Take 5,000 Units by mouth daily.   cinacalcet (SENSIPAR) 30 MG tablet Take 30 mg by mouth daily.   cloNIDine (CATAPRES) 0.1 MG tablet  TAKE 1 TABLET(0.1 MG) BY MOUTH THREE TIMES DAILY   esomeprazole (NEXIUM) 40 MG capsule TAKE 1 CAPSULE BY MOUTH TWICE DAILY   fenofibrate (TRICOR) 145 MG tablet TAKE 1 TABLET(145 MG) BY MOUTH DAILY   furosemide (LASIX) 40 MG tablet TAKE 1 TABLET BY MOUTH EVERY DAY   gabapentin (NEURONTIN) 100 MG capsule TAKE 2 CAPSULES(200 MG) BY MOUTH AT BEDTIME   labetalol (NORMODYNE) 300 MG tablet TAKE 1.5 TABLETS BY MOUTH TWICE DAILY   levothyroxine (SYNTHROID) 88 MCG tablet Take 88 mcg by mouth daily.   lisinopril (ZESTRIL) 40 MG tablet TAKE 1 TABLET BY MOUTH EVERY DAY   lurasidone (LATUDA) 40 MG TABS tablet Take 1 tablet (40 mg total) by mouth daily with breakfast.   Polyethylene Glycol 3350 (MIRALAX PO) Take 1 Capful by mouth daily as needed (Constipation).   Potassium Chloride ER 20 MEQ TBCR TAKE 1 TABLET BY MOUTH EVERY DAY   prochlorperazine (COMPAZINE) 10 MG tablet Take 1 tablet (10 mg total) by mouth every 6 (six) hours as needed.   TART CHERRY PO Take 1 tablet by mouth daily.   No facility-administered medications prior to visit.   Reviewed past medical and social history.   ROS per HPI above      Objective:  BP 120/68 (BP Location: Left Arm, Patient Position: Sitting, Cuff Size: Large)   Pulse 71   Temp 97.8 F (36.6 C) (Temporal)   Resp 18  LMP 05/02/2017   SpO2 97%      Physical Exam Vitals and nursing note reviewed.  Cardiovascular:     Rate and Rhythm: Normal rate.     Pulses: Normal pulses.  Pulmonary:     Effort: Pulmonary effort is normal.  Musculoskeletal:     Right lower leg: Edema present.     Left lower leg: No edema.  Skin:    Findings: No erythema.  Neurological:     Mental Status: She is alert and oriented to person, place, and time.     No results found for any visits on 03/15/23.    Assessment & Plan:    Problem List Items Addressed This Visit     Cellulitis of right lower extremity - Primary    Resolved redness  Reports Mild swelling and  intermittent pain Unable to tolerate compression garment at home Has appointment with vein and vascular specialist 04/12/23  Advised to continue leg elevation as much as possible Call vascular office for possible earlier appointment. Use gabapentin 100mg  in AM and 200mg  in PM if no daytime somnolence No need for oral abx at this time      Lymphatic edema    Improved Left LE edema with residual hyperpigmentation. Reports Mild swelling and intermittent pain. No erythema or increased warmth. Unable to tolerate compression garment at home. Advised to maintain upcoming appointment with vein and vascular specialist.      Return in about 3 months (around 06/15/2023) for DM, HTN.     Alysia Penna, NP

## 2023-03-15 NOTE — Assessment & Plan Note (Addendum)
Improved Left LE edema with residual hyperpigmentation. Reports Mild swelling and intermittent pain. No erythema or increased warmth. Unable to tolerate compression garment at home. Advised to maintain upcoming appointment with vein and vascular specialist.

## 2023-03-19 ENCOUNTER — Other Ambulatory Visit: Payer: Self-pay | Admitting: Cardiology

## 2023-03-21 NOTE — Progress Notes (Deleted)
MEDICAL HISTORY: Past Medical History:  Diagnosis Date   Acute on chronic respiratory failure with hypoxia and hypercapnia (HCC) 09/15/2018   Anemia    Anxiety    Arrhythmia    tachycardia   Arthritis    Bipolar 1 disorder (HCC) 09/12/2018   Bipolar I disorder, current or most recent episode manic, with psychotic features (HCC)    Brief psychotic disorder (HCC) 08/22/2018   Chronic respiratory failure with hypoxia (HCC) 05/10/2018   Formatting of this note might be different from the original. Last Assessment & Plan:  Continue 1 L continuous on exertion and during sleep. On her next visit we will check OSA on CPAP/room air to decide whether she needs to take oxygen along with her on her cruise in May   Depression    Diverticulitis    Generalized edema 03/12/2018   GERD (gastroesophageal reflux disease)    Glaucoma    Hyperlipidemia    Hyperparathyroidism (HCC)    Hypertension    Inflammatory polyps of colon (HCC)    Leg swelling 01/14/2019   LVH (left ventricular hypertrophy)    Lymphedema    Morbid (severe) obesity due to excess calories (HCC) 05/11/2017   PCOS (polycystic ovarian syndrome)    Prediabetes    Recurrent UTI    Sleep apnea    CPAP   Thyroid disease    Vitamin D deficiency     ALLERGIES:  is allergic to other, fentanyl, levofloxacin, midazolam, pollen extract, atorvastatin, hydralazine hcl, and rosuvastatin.  MEDICATIONS:  Current Outpatient Medications  Medication Sig Dispense Refill   acetaminophen (TYLENOL) 500 MG tablet Take 1,000 mg by mouth 3 (three) times daily as needed for moderate pain or headache.     amLODipine (NORVASC) 10 MG tablet TAKE 1 TABLET BY MOUTH EVERY DAY 90 tablet 3   atorvastatin (LIPITOR) 20 MG tablet TAKE 1 TABLET(20 MG) BY MOUTH 3 TIMES A WEEK 12 tablet 5   benztropine (COGENTIN) 0.5 MG tablet Take 1 tablet (0.5 mg total) by mouth at bedtime. 30 tablet 2   cetirizine (ZYRTEC) 10 MG tablet TAKE 1 TABLET BY MOUTH EVERY DAY 90  tablet 1   Cholecalciferol (VITAMIN D3 MAXIMUM STRENGTH) 125 MCG (5000 UT) capsule Take 5,000 Units by mouth daily.     cinacalcet (SENSIPAR) 30 MG tablet Take 30 mg by mouth daily.     cloNIDine (CATAPRES) 0.1 MG tablet TAKE 1 TABLET(0.1 MG) BY MOUTH THREE TIMES DAILY 90 tablet 3   esomeprazole (NEXIUM) 40 MG capsule TAKE 1 CAPSULE BY MOUTH TWICE DAILY 180 capsule 2   fenofibrate (TRICOR) 145 MG tablet TAKE 1 TABLET(145 MG) BY MOUTH DAILY 90 tablet 3   furosemide (LASIX) 40 MG tablet TAKE 1 TABLET BY MOUTH EVERY DAY 90 tablet 3   gabapentin (NEURONTIN) 100 MG capsule TAKE 2 CAPSULES(200 MG) BY MOUTH AT BEDTIME 180 capsule 3   labetalol (NORMODYNE) 300 MG tablet TAKE 1.5 TABLETS BY MOUTH TWICE DAILY 270 tablet 3   levothyroxine (SYNTHROID) 88 MCG tablet Take 88 mcg by mouth daily.     lisinopril (ZESTRIL) 40 MG tablet TAKE 1 TABLET BY MOUTH EVERY DAY 90 tablet 1   lurasidone (LATUDA) 40 MG TABS tablet Take 1 tablet (40 mg total) by mouth daily with breakfast. 30 tablet 2   Polyethylene Glycol 3350 (MIRALAX PO) Take 1 Capful by mouth daily as needed (Constipation).     Potassium Chloride ER 20 MEQ TBCR TAKE 1 TABLET BY MOUTH EVERY DAY 90 tablet 2  prochlorperazine (COMPAZINE) 10 MG tablet Take 1 tablet (10 mg total) by mouth every 6 (six) hours as needed. 30 tablet 2   TART CHERRY PO Take 1 tablet by mouth daily.     No current facility-administered medications for this visit.    SURGICAL HISTORY:  Past Surgical History:  Procedure Laterality Date   BIOPSY  09/27/2022   Procedure: BIOPSY;  Surgeon: Mansouraty, Netty Starring., MD;  Location: [facility] ENDOSCOPY;  Service: Gastroenterology;;   BREAST BIOPSY  2015   CESAREAN SECTION  2004   CHOLECYSTECTOMY     COLONOSCOPY WITH PROPOFOL N/A 09/27/2022   Procedure: COLONOSCOPY WITH PROPOFOL;  Surgeon: Lemar Lofty., MD;  Location: [facility] ENDOSCOPY;  Service: Gastroenterology;  Laterality: N/A;   ESOPHAGOGASTRODUODENOSCOPY (EGD) WITH PROPOFOL N/A  09/27/2022   Procedure: ESOPHAGOGASTRODUODENOSCOPY (EGD) WITH PROPOFOL;  Surgeon: Meridee Score Netty Starring., MD;  Location: [facility] ENDOSCOPY;  Service: Gastroenterology;  Laterality: N/A;   INNER EAR SURGERY     ear and sinus surgery   LAPAROSCOPIC REPAIR AND REMOVAL OF GASTRIC BAND     MALONEY DILATION  09/27/2022   Procedure: MALONEY DILATION;  Surgeon: Lemar Lofty., MD;  Location: [facility] ENDOSCOPY;  Service: Gastroenterology;;   OOPHORECTOMY Left    POLYPECTOMY  09/27/2022   Procedure: POLYPECTOMY;  Surgeon: Lemar Lofty., MD;  Location: [facility] ENDOSCOPY;  Service: Gastroenterology;;   TONSILLECTOMY AND ADENOIDECTOMY      REVIEW OF SYSTEMS:   Review of Systems  Constitutional: Negative for appetite change, chills, fatigue, fever and unexpected weight change.  HENT:   Negative for mouth sores, nosebleeds, sore throat and trouble swallowing.   Eyes: Negative for eye problems and icterus.  Respiratory: Negative for cough, hemoptysis, shortness of breath and wheezing.   Cardiovascular: Negative for chest pain and leg swelling.  Gastrointestinal: Negative for abdominal pain, constipation, diarrhea, nausea and vomiting.  Genitourinary: Negative for bladder incontinence, difficulty urinating, dysuria, frequency and hematuria.   Musculoskeletal: Negative for back pain, gait problem, neck pain and neck stiffness.  Skin: Negative for itching and rash.  Neurological: Negative for dizziness, extremity weakness, gait problem, headaches, light-headedness and seizures.  Hematological: Negative for adenopathy. Does not bruise/bleed easily.  Psychiatric/Behavioral: Negative for confusion, depression and sleep disturbance. The patient is not nervous/anxious.     PHYSICAL EXAMINATION:  Last menstrual period 05/02/2017.  ECOG PERFORMANCE STATUS: {CHL ONC ECOG [id number]  Physical Exam  Constitutional: Oriented to person, place, and time and well-developed, well-nourished, and in no  distress. No distress.  HENT:  Head: Normocephalic and atraumatic.  Mouth/Throat: Oropharynx is clear and moist. No oropharyngeal exudate.  Eyes: Conjunctivae are normal. Right eye exhibits no discharge. Left eye exhibits no discharge. No scleral icterus.  Neck: Normal range of motion. Neck supple.  Cardiovascular: Normal rate, regular rhythm, normal heart sounds and intact distal pulses.   Pulmonary/Chest: Effort normal and breath sounds normal. No respiratory distress. No wheezes. No rales.  Abdominal: Soft. Bowel sounds are normal. Exhibits no distension and no mass. There is no tenderness.  Musculoskeletal: Normal range of motion. Exhibits no edema.  Lymphadenopathy:    No cervical adenopathy.  Neurological: Alert and oriented to person, place, and time. Exhibits normal muscle tone. Gait normal. Coordination normal.  Skin: Skin is warm and dry. No rash noted. Not diaphoretic. No erythema. No pallor.  Psychiatric: Mood, memory and judgment normal.  Vitals reviewed.  LABORATORY DATA: Lab Results  Component Value Date   WBC 4.0 03/06/2023   HGB 9.5 (L) 03/06/2023   HCT  27.5 (L) 03/06/2023   MCV 106.2 (H) 03/06/2023   PLT 281 03/06/2023      Chemistry      Component Value Date/Time   NA 142 03/06/2023 1122   NA 145 09/08/2021 0000   K 3.8 03/06/2023 1122   CL 103 03/06/2023 1122   CO2 33 (H) 03/06/2023 1122   BUN 15 03/06/2023 1122   BUN 36 (H) 08/22/2021 1606   CREATININE 1.16 (H) 03/06/2023 1122   CREATININE 1.13 (H) 12/24/2018 1402   GLU 97 09/08/2021 0000      Component Value Date/Time   CALCIUM 9.8 03/06/2023 1122   ALKPHOS 62 03/06/2023 1122   AST 26 03/06/2023 1122   ALT 21 03/06/2023 1122   BILITOT 0.5 03/06/2023 1122       RADIOGRAPHIC STUDIES:  No results found.   ASSESSMENT/PLAN:  No problem-specific Assessment & Plan notes found for this encounter.   No orders of the defined types were placed in this encounter.    I spent {CHL ONC TIME VISIT  - ZOXWR:[id number]} counseling the patient face to face. The total time spent in the appointment was {CHL ONC TIME VISIT - BJYNW:[id number]}.  Leahanna Buser L Miliana Gangwer, PA-C 03/21/23

## 2023-03-21 NOTE — Progress Notes (Deleted)
[facility] OFFICE PROGRESS NOTE  [name], [name], NP [address] [place] [address]  DIAGNOSIS: Stage IV non-small cell lung cancer, adenocarcinoma (T2a, N3, M1c).  She presented with bilateral lung nodules, thoracic adenopathy and bilobar liver lesions.  She was diagnosed in May 2024.     PDL1: 0   Guardant 360: Negative for any actionable mutations   Foundation One: No actionable mutations   PRIOR THERAPY: None  CURRENT THERAPY: Palliative systemic chemotherapy and immunotherapy with carboplatin for an AUC of 5, alimta 500 mg/m2, and Keytruda 200 mg IV every 3 weeks. First dose on 10/10/22. Status post 8 cycles. Starting from cycle #5 the patient is on maintenance treatment with Alimta and Keytruda every 3 weeks   INTERVAL HISTORY: ZYKIRIA GUTTILLA 57 y.o. female returns to clinic today for follow up visit accompanied by his wife.  The patient was last seen 3 weeks ago by Dr. Arbutus Ped.  She had a restaging CT scan performed in October 2024 that showed some concern for progression of hepatic metastases.  They report Dr. Arbutus Ped had ordered a PET scan which was performed on 03/15/2023.  She is currently on maintenance immunotherapy and chemotherapy.  She does experience fatigue with treatment.   She denies any fever, chills, or night sweats.  She denies any significant shortness of breath.  Occasional dry cough?  Denies any hemoptysis or chest pain.  She denies any nausea, vomiting, or diarrhea.  Constipation?  She has been seeing her PCP regarding concerns with cellulitis and she has an upcoming appointment with vascular on 04/12/23.  She denies any headache or visual changes.  She is here today for evaluation and to review her scan results before undergoing cycle #9.    MEDICAL HISTORY: Past Medical History:  Diagnosis Date   Acute on chronic respiratory failure with hypoxia and hypercapnia (HCC) 09/15/2018   Anemia    Anxiety    Arrhythmia     tachycardia   Arthritis    Bipolar 1 disorder (HCC) 09/12/2018   Bipolar I disorder, current or most recent episode manic, with psychotic features (HCC)    Brief psychotic disorder (HCC) 08/22/2018   Chronic respiratory failure with hypoxia (HCC) 05/10/2018   Formatting of this note might be different from the original. Last Assessment & Plan:  Continue 1 L continuous on exertion and during sleep. On her next visit we will check OSA on CPAP/room air to decide whether she needs to take oxygen along with her on her cruise in May   Depression    Diverticulitis    Generalized edema 03/12/2018   GERD (gastroesophageal reflux disease)    Glaucoma    Hyperlipidemia    Hyperparathyroidism (HCC)    Hypertension    Inflammatory polyps of colon (HCC)    Leg swelling 01/14/2019   LVH (left ventricular hypertrophy)    Lymphedema    Morbid (severe) obesity due to excess calories (HCC) 05/11/2017   PCOS (polycystic ovarian syndrome)    Prediabetes    Recurrent UTI    Sleep apnea    CPAP   Thyroid disease    Vitamin D deficiency     ALLERGIES:  is allergic to other, fentanyl, levofloxacin, midazolam, pollen extract, atorvastatin, hydralazine hcl, and rosuvastatin.  MEDICATIONS:  Current Outpatient Medications  Medication Sig Dispense Refill   acetaminophen (TYLENOL) 500 MG tablet Take 1,000 mg by mouth 3 (three) times daily as needed for moderate pain or headache.     amLODipine (  NORVASC) 10 MG tablet TAKE 1 TABLET BY MOUTH EVERY DAY 90 tablet 3   atorvastatin (LIPITOR) 20 MG tablet TAKE 1 TABLET(20 MG) BY MOUTH 3 TIMES A WEEK 12 tablet 5   benztropine (COGENTIN) 0.5 MG tablet Take 1 tablet (0.5 mg total) by mouth at bedtime. 30 tablet 2   cetirizine (ZYRTEC) 10 MG tablet TAKE 1 TABLET BY MOUTH EVERY DAY 90 tablet 1   Cholecalciferol (VITAMIN D3 MAXIMUM STRENGTH) 125 MCG (5000 UT) capsule Take 5,000 Units by mouth daily.     cinacalcet (SENSIPAR) 30 MG tablet Take 30 mg by mouth daily.      cloNIDine (CATAPRES) 0.1 MG tablet TAKE 1 TABLET(0.1 MG) BY MOUTH THREE TIMES DAILY 90 tablet 3   esomeprazole (NEXIUM) 40 MG capsule TAKE 1 CAPSULE BY MOUTH TWICE DAILY 180 capsule 2   fenofibrate (TRICOR) 145 MG tablet TAKE 1 TABLET(145 MG) BY MOUTH DAILY 90 tablet 3   furosemide (LASIX) 40 MG tablet TAKE 1 TABLET BY MOUTH EVERY DAY 90 tablet 3   gabapentin (NEURONTIN) 100 MG capsule TAKE 2 CAPSULES(200 MG) BY MOUTH AT BEDTIME 180 capsule 3   labetalol (NORMODYNE) 300 MG tablet TAKE 1 AND 1/2 TABLETS BY MOUTH TWICE DAILY 270 tablet 0   levothyroxine (SYNTHROID) 88 MCG tablet Take 88 mcg by mouth daily.     lisinopril (ZESTRIL) 40 MG tablet TAKE 1 TABLET BY MOUTH EVERY DAY 90 tablet 1   lurasidone (LATUDA) 40 MG TABS tablet Take 1 tablet (40 mg total) by mouth daily with breakfast. 30 tablet 2   Polyethylene Glycol 3350 (MIRALAX PO) Take 1 Capful by mouth daily as needed (Constipation).     Potassium Chloride ER 20 MEQ TBCR TAKE 1 TABLET BY MOUTH EVERY DAY 90 tablet 2   prochlorperazine (COMPAZINE) 10 MG tablet Take 1 tablet (10 mg total) by mouth every 6 (six) hours as needed. 30 tablet 2   TART CHERRY PO Take 1 tablet by mouth daily.     No current facility-administered medications for this visit.    SURGICAL HISTORY:  Past Surgical History:  Procedure Laterality Date   BIOPSY  09/27/2022   Procedure: BIOPSY;  Surgeon: Mansouraty, Netty Starring., MD;  Location: [facility] ENDOSCOPY;  Service: Gastroenterology;;   BREAST BIOPSY  2015   CESAREAN SECTION  2004   CHOLECYSTECTOMY     COLONOSCOPY WITH PROPOFOL N/A 09/27/2022   Procedure: COLONOSCOPY WITH PROPOFOL;  Surgeon: Lemar Lofty., MD;  Location: [facility] ENDOSCOPY;  Service: Gastroenterology;  Laterality: N/A;   ESOPHAGOGASTRODUODENOSCOPY (EGD) WITH PROPOFOL N/A 09/27/2022   Procedure: ESOPHAGOGASTRODUODENOSCOPY (EGD) WITH PROPOFOL;  Surgeon: Meridee Score Netty Starring., MD;  Location: [facility] ENDOSCOPY;  Service: Gastroenterology;  Laterality: N/A;    INNER EAR SURGERY     ear and sinus surgery   LAPAROSCOPIC REPAIR AND REMOVAL OF GASTRIC BAND     MALONEY DILATION  09/27/2022   Procedure: MALONEY DILATION;  Surgeon: Lemar Lofty., MD;  Location: [facility] ENDOSCOPY;  Service: Gastroenterology;;   OOPHORECTOMY Left    POLYPECTOMY  09/27/2022   Procedure: POLYPECTOMY;  Surgeon: Lemar Lofty., MD;  Location: [facility] ENDOSCOPY;  Service: Gastroenterology;;   TONSILLECTOMY AND ADENOIDECTOMY      REVIEW OF SYSTEMS:   Review of Systems  Constitutional: Negative for appetite change, chills, fatigue, fever and unexpected weight change.  HENT:   Negative for mouth sores, nosebleeds, sore throat and trouble swallowing.   Eyes: Negative for eye problems and icterus.  Respiratory: Negative for cough, hemoptysis, shortness of  breath and wheezing.   Cardiovascular: Negative for chest pain and leg swelling.  Gastrointestinal: Negative for abdominal pain, constipation, diarrhea, nausea and vomiting.  Genitourinary: Negative for bladder incontinence, difficulty urinating, dysuria, frequency and hematuria.   Musculoskeletal: Negative for back pain, gait problem, neck pain and neck stiffness.  Skin: Negative for itching and rash.  Neurological: Negative for dizziness, extremity weakness, gait problem, headaches, light-headedness and seizures.  Hematological: Negative for adenopathy. Does not bruise/bleed easily.  Psychiatric/Behavioral: Negative for confusion, depression and sleep disturbance. The patient is not nervous/anxious.     PHYSICAL EXAMINATION:  Last menstrual period 05/02/2017.  ECOG PERFORMANCE STATUS: {CHL ONC ECOG [id number]  Physical Exam  Constitutional: Oriented to person, place, and time and well-developed, well-nourished, and in no distress. No distress.  HENT:  Head: Normocephalic and atraumatic.  Mouth/Throat: Oropharynx is clear and moist. No oropharyngeal exudate.  Eyes: Conjunctivae are normal. Right eye exhibits  no discharge. Left eye exhibits no discharge. No scleral icterus.  Neck: Normal range of motion. Neck supple.  Cardiovascular: Normal rate, regular rhythm, normal heart sounds and intact distal pulses.   Pulmonary/Chest: Effort normal and breath sounds normal. No respiratory distress. No wheezes. No rales.  Abdominal: Soft. Bowel sounds are normal. Exhibits no distension and no mass. There is no tenderness.  Musculoskeletal: Normal range of motion. Exhibits no edema.  Lymphadenopathy:    No cervical adenopathy.  Neurological: Alert and oriented to person, place, and time. Exhibits normal muscle tone. Gait normal. Coordination normal.  Skin: Skin is warm and dry. No rash noted. Not diaphoretic. No erythema. No pallor.  Psychiatric: Mood, memory and judgment normal.  Vitals reviewed.  LABORATORY DATA: Lab Results  Component Value Date   WBC 4.0 03/06/2023   HGB 9.5 (L) 03/06/2023   HCT 27.5 (L) 03/06/2023   MCV 106.2 (H) 03/06/2023   PLT 281 03/06/2023      Chemistry      Component Value Date/Time   NA 142 03/06/2023 1122   NA 145 09/08/2021 0000   K 3.8 03/06/2023 1122   CL 103 03/06/2023 1122   CO2 33 (H) 03/06/2023 1122   BUN 15 03/06/2023 1122   BUN 36 (H) 08/22/2021 1606   CREATININE 1.16 (H) 03/06/2023 1122   CREATININE 1.13 (H) 12/24/2018 1402   GLU 97 09/08/2021 0000      Component Value Date/Time   CALCIUM 9.8 03/06/2023 1122   ALKPHOS 62 03/06/2023 1122   AST 26 03/06/2023 1122   ALT 21 03/06/2023 1122   BILITOT 0.5 03/06/2023 1122       RADIOGRAPHIC STUDIES:  No results found.   ASSESSMENT/PLAN:  This is a very pleasant 57 year old African-American female referred to clinic for what is felt to be stage IV non-small cell lung cancer, adenocarcinoma (T2a, N3, M1c). She presented with bilateral lung nodules, thoracic adenopathy and bilobar liver lesions. She was diagnosed in May 2024. Her PD-L1 expression 0%. She does not have any actionable mutations by  Guardant360. Her foundation 1 molecular testing is negative.   The patient is currently undergoing systemic chemoimmunotherapy with carboplatin for AUC 5, Alimta 500 Mg/M2 and Keytruda 200 Mg IV every 3 weeks status post 8 cycles.  Starting from cycle #5 she is on maintenance treatment with Alimta and Keytruda every 3 weeks.     Patient recently had a PET scan.  Referral from Dr. Arbutus Ped today.  Dr. Arbutus Ped personally dependently reviewed the scan discussed results with the patient today.  The scan showed ***  Dr. Arbutus Ped had a lengthy discussion with the patient today about her current condition and treatment options.  Dr. Arbutus Ped recommended ***  I will arrange for  ***Follow-up?  The patient was advised to call immediately if she has any concerning symptoms in the interval. The patient voices understanding of current disease status and treatment options and is in agreement with the current care plan. All questions were answered. The patient knows to call the clinic with any problems, questions or concerns. We can certainly see the patient much sooner if necessary    No orders of the defined types were placed in this encounter.    I spent {CHL ONC TIME VISIT - ZOXWR:[id number]} counseling the patient face to face. The total time spent in the appointment was {CHL ONC TIME VISIT - BJYNW:[id number]}.  Makynli Stills L Kyndell Zeiser, PA-C 03/22/23

## 2023-03-26 ENCOUNTER — Inpatient Hospital Stay: Payer: BC Managed Care – PPO | Admitting: Physician Assistant

## 2023-03-26 ENCOUNTER — Telehealth: Payer: Self-pay

## 2023-03-26 ENCOUNTER — Inpatient Hospital Stay: Payer: BC Managed Care – PPO

## 2023-03-26 NOTE — Telephone Encounter (Signed)
Spoke with pt this morning in regards to appt time missed.   Pt stated that she was not aware of the appt change.  Changed appt with Cassie to a telephone call for today and made lab appt tomorrow before infusion. Pt verbalized understanding.

## 2023-03-26 NOTE — Telephone Encounter (Signed)
New appts for pt is confirmed for tomorrow.  Pt verbalized understanding.

## 2023-03-26 NOTE — Progress Notes (Unsigned)
[facility] OFFICE PROGRESS NOTE  Madison Palmer, Madison Gains, NP [address] [place] [address]  DIAGNOSIS: Stage IV non-small cell lung cancer, adenocarcinoma (T2a, N3, M1c).  She presented with bilateral lung nodules, thoracic adenopathy and bilobar liver lesions.  She was diagnosed in May 2024.     PDL1: 0   Guardant 360: Negative for any actionable mutations   Foundation One: No actionable mutations   PRIOR THERAPY: None  CURRENT THERAPY: Palliative systemic chemotherapy and immunotherapy with carboplatin for an AUC of 5, alimta 500 mg/m2, and Keytruda 200 mg IV every 3 weeks. First dose on 10/10/22. Status post 8 cycles. Starting from cycle #5 the patient is on maintenance treatment with Alimta and Keytruda every 3 weeks   INTERVAL HISTORY: Madison Palmer 57 y.o. female returns to the clinic today for a follow up visit accompanied by her son.  The patient was last seen 3 weeks ago by Dr. Arbutus Ped.  She had a restaging CT scan performed in October 2024 that showed some concern for progression of hepatic metastases.  They report Dr. Arbutus Ped had ordered a PET scan which was performed on 03/15/2023, which showed stable disease.   She is currently on maintenance immunotherapy and chemotherapy.  She does experience fatigue with treatment.  She also had some concern with cellulitis.  She has some residual pigmentation and dry skin on her right lower extremity.  She does not use lotion on a daily basis.  She is using utterly with cream. There is no warmth or swelling. There is no drainage.  Her PCP referred her to vascular and she has an upcoming appointment on 04/12/2023.    She denies any fever or night sweats.  She denies any significant shortness of breath.  Denies any hemoptysis or chest pain.  She denies any nausea, vomiting, or diarrhea. Denies constipation. She denies any headache or visual changes.  She is here today for evaluation and to review her scan results  before undergoing cycle #9.    MEDICAL HISTORY: Past Medical History:  Diagnosis Date   Acute on chronic respiratory failure with hypoxia and hypercapnia (HCC) 09/15/2018   Anemia    Anxiety    Arrhythmia    tachycardia   Arthritis    Bipolar 1 disorder (HCC) 09/12/2018   Bipolar I disorder, current or most recent episode manic, with psychotic features (HCC)    Brief psychotic disorder (HCC) 08/22/2018   Chronic respiratory failure with hypoxia (HCC) 05/10/2018   Formatting of this note might be different from the original. Last Assessment & Plan:  Continue 1 L continuous on exertion and during sleep. On her next visit we will check OSA on CPAP/room air to decide whether she needs to take oxygen along with her on her cruise in May   Depression    Diverticulitis    Generalized edema 03/12/2018   GERD (gastroesophageal reflux disease)    Glaucoma    Hyperlipidemia    Hyperparathyroidism (HCC)    Hypertension    Inflammatory polyps of colon (HCC)    Leg swelling 01/14/2019   LVH (left ventricular hypertrophy)    Lymphedema    Morbid (severe) obesity due to excess calories (HCC) 05/11/2017   PCOS (polycystic ovarian syndrome)    Prediabetes    Recurrent UTI    Sleep apnea    CPAP   Thyroid disease    Vitamin D deficiency     ALLERGIES:  is allergic to other, fentanyl, levofloxacin, midazolam, pollen  extract, atorvastatin, hydralazine hcl, and rosuvastatin.  MEDICATIONS:  Current Outpatient Medications  Medication Sig Dispense Refill   acetaminophen (TYLENOL) 500 MG tablet Take 1,000 mg by mouth 3 (three) times daily as needed for moderate pain or headache.     amLODipine (NORVASC) 10 MG tablet TAKE 1 TABLET BY MOUTH EVERY DAY 90 tablet 3   atorvastatin (LIPITOR) 20 MG tablet TAKE 1 TABLET(20 MG) BY MOUTH 3 TIMES A WEEK 12 tablet 5   benztropine (COGENTIN) 0.5 MG tablet Take 1 tablet (0.5 mg total) by mouth at bedtime. 30 tablet 2   cetirizine (ZYRTEC) 10 MG tablet TAKE 1  TABLET BY MOUTH EVERY DAY 90 tablet 1   Cholecalciferol (VITAMIN D3 MAXIMUM STRENGTH) 125 MCG (5000 UT) capsule Take 5,000 Units by mouth daily.     cinacalcet (SENSIPAR) 30 MG tablet Take 30 mg by mouth daily.     cloNIDine (CATAPRES) 0.1 MG tablet TAKE 1 TABLET(0.1 MG) BY MOUTH THREE TIMES DAILY 90 tablet 3   esomeprazole (NEXIUM) 40 MG capsule TAKE 1 CAPSULE BY MOUTH TWICE DAILY 180 capsule 2   fenofibrate (TRICOR) 145 MG tablet TAKE 1 TABLET(145 MG) BY MOUTH DAILY 90 tablet 3   furosemide (LASIX) 40 MG tablet TAKE 1 TABLET BY MOUTH EVERY DAY 90 tablet 3   gabapentin (NEURONTIN) 100 MG capsule TAKE 2 CAPSULES(200 MG) BY MOUTH AT BEDTIME 180 capsule 3   labetalol (NORMODYNE) 300 MG tablet TAKE 1 AND 1/2 TABLETS BY MOUTH TWICE DAILY 270 tablet 0   levothyroxine (SYNTHROID) 88 MCG tablet Take 88 mcg by mouth daily.     lisinopril (ZESTRIL) 40 MG tablet TAKE 1 TABLET BY MOUTH EVERY DAY 90 tablet 1   lurasidone (LATUDA) 40 MG TABS tablet Take 1 tablet (40 mg total) by mouth daily with breakfast. 30 tablet 2   Polyethylene Glycol 3350 (MIRALAX PO) Take 1 Capful by mouth daily as needed (Constipation).     Potassium Chloride ER 20 MEQ TBCR TAKE 1 TABLET BY MOUTH EVERY DAY 90 tablet 2   prochlorperazine (COMPAZINE) 10 MG tablet Take 1 tablet (10 mg total) by mouth every 6 (six) hours as needed. 30 tablet 2   TART CHERRY PO Take 1 tablet by mouth daily.     No current facility-administered medications for this visit.    SURGICAL HISTORY:  Past Surgical History:  Procedure Laterality Date   BIOPSY  09/27/2022   Procedure: BIOPSY;  Surgeon: Mansouraty, Netty Starring., MD;  Location: [facility] ENDOSCOPY;  Service: Gastroenterology;;   BREAST BIOPSY  2015   CESAREAN SECTION  2004   CHOLECYSTECTOMY     COLONOSCOPY WITH PROPOFOL N/A 09/27/2022   Procedure: COLONOSCOPY WITH PROPOFOL;  Surgeon: Lemar Lofty., MD;  Location[facility] ENDOSCOPY;  Service: Gastroenterology;  Laterality: N/A;    ESOPHAGOGASTRODUODENOSCOPY (EGD) WITH PROPOFOL N/A 09/27/2022   Procedure: ESOPHAGOGASTRODUODENOSCOPY (EGD) WITH PROPOFOL;  Surgeon: Meridee Score Netty Starring., MD;  Location: [facility] ENDOSCOPY;  Service: Gastroenterology;  Laterality: N/A;   INNER EAR SURGERY     ear and sinus surgery   LAPAROSCOPIC REPAIR AND REMOVAL OF GASTRIC BAND     MALONEY DILATION  09/27/2022   Procedure: MALONEY DILATION;  Surgeon: Lemar Lofty., MD;  Location: [facility] ENDOSCOPY;  Service: Gastroenterology;;   OOPHORECTOMY Left    POLYPECTOMY  09/27/2022   Procedure: POLYPECTOMY;  Surgeon: Lemar Lofty., MD;  Location: [facility] ENDOSCOPY;  Service: Gastroenterology;;   TONSILLECTOMY AND ADENOIDECTOMY      REVIEW OF SYSTEMS:   Review of  Systems  Constitutional: Positive for fatigue. Negative for appetite change, chills, fever and unexpected weight change.  HENT: Negative for mouth sores, nosebleeds, sore throat and trouble swallowing.   Eyes: Negative for eye problems and icterus.  Respiratory: Negative for cough, hemoptysis, shortness of breath and wheezing.   Cardiovascular: Negative for chest pain and leg swelling.  Gastrointestinal: Negative for abdominal pain, constipation, diarrhea, nausea and vomiting.  Genitourinary: Negative for bladder incontinence, difficulty urinating, dysuria, frequency and hematuria.   Musculoskeletal: Negative for back pain, gait problem, neck pain and neck stiffness.  Skin: Positive for dry skin and hyperpigmentation on her right lower extremity.  Neurological: Negative for dizziness, extremity weakness, gait problem, headaches, light-headedness and seizures.  Hematological: Negative for adenopathy. Does not bruise/bleed easily.  Psychiatric/Behavioral: Negative for confusion, depression and sleep disturbance. The patient is not nervous/anxious.     PHYSICAL EXAMINATION:  Last menstrual period 05/02/2017.  ECOG PERFORMANCE STATUS: 1  Physical Exam  Constitutional: Oriented to  person, place, and time and well-developed, well-nourished, and in no distress.  HENT:  Head: Normocephalic and atraumatic.  Mouth/Throat: Oropharynx is clear and moist. No oropharyngeal exudate.  Eyes: Conjunctivae are normal. Right eye exhibits no discharge. Left eye exhibits no discharge. No scleral icterus.  Neck: Normal range of motion. Neck supple.  Cardiovascular: Normal rate, regular rhythm, normal heart sounds and intact distal pulses.   Pulmonary/Chest: Effort normal and breath sounds normal. No respiratory distress. No wheezes. No rales.  Abdominal: Soft. Bowel sounds are normal. Exhibits no distension and no mass. There is no tenderness.  Musculoskeletal: Normal range of motion. Exhibits no edema.  Lymphadenopathy:    No cervical adenopathy.  Neurological: Alert and oriented to person, place, and time. Exhibits normal muscle tone. Gait normal. Coordination normal.  Skin: Skin is warm and dry. Positive for hyperpigmentation and dry cracked skin on right lower extremity. No warmth, swelling, or erythema. Not diaphoretic.  No pallor.  Psychiatric: Mood, memory and judgment normal.  Vitals reviewed.  LABORATORY DATA: Lab Results  Component Value Date   WBC 4.0 03/06/2023   HGB 9.5 (L) 03/06/2023   HCT 27.5 (L) 03/06/2023   MCV 106.2 (H) 03/06/2023   PLT 281 03/06/2023      Chemistry      Component Value Date/Time   NA 142 03/06/2023 1122   NA 145 09/08/2021 0000   K 3.8 03/06/2023 1122   CL 103 03/06/2023 1122   CO2 33 (H) 03/06/2023 1122   BUN 15 03/06/2023 1122   BUN 36 (H) 08/22/2021 1606   CREATININE 1.16 (H) 03/06/2023 1122   CREATININE 1.13 (H) 12/24/2018 1402   GLU 97 09/08/2021 0000      Component Value Date/Time   CALCIUM 9.8 03/06/2023 1122   ALKPHOS 62 03/06/2023 1122   AST 26 03/06/2023 1122   ALT 21 03/06/2023 1122   BILITOT 0.5 03/06/2023 1122       RADIOGRAPHIC STUDIES:  No results found.   ASSESSMENT/PLAN:  This is a very pleasant  57 year old African-American female referred to clinic for what is felt to be stage IV non-small cell lung cancer, adenocarcinoma (T2a, N3, M1c). She presented with bilateral lung nodules, thoracic adenopathy and bilobar liver lesions. She was diagnosed in May 2024. Her PD-L1 expression 0%. She does not have any actionable mutations by Guardant360. Her foundation 1 molecular testing is negative.   The patient is currently undergoing systemic chemoimmunotherapy with carboplatin for AUC 5, Alimta 500 Mg/M2 and Keytruda 200 Mg IV every 3  weeks status post 8 cycles.  Starting from cycle #5 she is on maintenance treatment with Alimta and Keytruda every 3 weeks.     The patient recently had a PET scan.  I reviewed the scan with Dr. Arbutus Ped yesterday prior to him being out of the office. The scan did not show disease progression.   Recommend she continue on the same treatment at the same dose.  She will proceed with cycle #9 today of Alimta and Keytruda as scheduled.  She has hyperpigmentation and dry skin on her right leg. It does not appear infected and no evidence of cellulitis. The dry skin could be exacerbated by [place] and the skin darkening by alimta. She was advised to use aquaphor or eucerin twice a day. Of course, if she develop any signs of infection such as fevers, erythema, warmth, swelling, pain,, etc., she was advised to seek medical attention.  She is scheduled to see vascular on 04/12/2023.  The patient was advised to call immediately if she has any concerning symptoms in the interval. The patient voices understanding of current disease status and treatment options and is in agreement with the current care plan. All questions were answered. The patient knows to call the clinic with any problems, questions or concerns. We can certainly see the patient much sooner if necessary    No orders of the defined types were placed in this encounter.   The total time spent in the appointment was  20-29 minutes  Charvi Gammage L Taneil Lazarus, PA-C 03/22/23

## 2023-03-27 ENCOUNTER — Other Ambulatory Visit: Payer: BC Managed Care – PPO

## 2023-03-27 ENCOUNTER — Encounter (INDEPENDENT_AMBULATORY_CARE_PROVIDER_SITE_OTHER): Payer: Self-pay | Admitting: Family Medicine

## 2023-03-27 ENCOUNTER — Ambulatory Visit (INDEPENDENT_AMBULATORY_CARE_PROVIDER_SITE_OTHER): Payer: BC Managed Care – PPO | Admitting: Family Medicine

## 2023-03-27 ENCOUNTER — Inpatient Hospital Stay (HOSPITAL_BASED_OUTPATIENT_CLINIC_OR_DEPARTMENT_OTHER): Payer: BC Managed Care – PPO | Admitting: Physician Assistant

## 2023-03-27 ENCOUNTER — Ambulatory Visit: Payer: BC Managed Care – PPO | Admitting: Physician Assistant

## 2023-03-27 ENCOUNTER — Inpatient Hospital Stay: Payer: BC Managed Care – PPO

## 2023-03-27 VITALS — BP 134/79 | HR 69 | Temp 98.1°F | Resp 16 | Wt 282.7 lb

## 2023-03-27 VITALS — BP 115/67 | HR 85 | Temp 98.1°F | Ht 62.0 in | Wt 281.0 lb

## 2023-03-27 DIAGNOSIS — I10 Essential (primary) hypertension: Secondary | ICD-10-CM | POA: Diagnosis not present

## 2023-03-27 DIAGNOSIS — Z5112 Encounter for antineoplastic immunotherapy: Secondary | ICD-10-CM

## 2023-03-27 DIAGNOSIS — L309 Dermatitis, unspecified: Secondary | ICD-10-CM

## 2023-03-27 DIAGNOSIS — Z6841 Body Mass Index (BMI) 40.0 and over, adult: Secondary | ICD-10-CM

## 2023-03-27 DIAGNOSIS — E669 Obesity, unspecified: Secondary | ICD-10-CM

## 2023-03-27 DIAGNOSIS — Z5111 Encounter for antineoplastic chemotherapy: Secondary | ICD-10-CM | POA: Diagnosis not present

## 2023-03-27 DIAGNOSIS — C3492 Malignant neoplasm of unspecified part of left bronchus or lung: Secondary | ICD-10-CM

## 2023-03-27 LAB — TSH: TSH: 1.444 u[IU]/mL (ref 0.350–4.500)

## 2023-03-27 LAB — CMP (CANCER CENTER ONLY)
ALT: 29 U/L (ref 0–44)
AST: 26 U/L (ref 15–41)
Albumin: 3.9 g/dL (ref 3.5–5.0)
Alkaline Phosphatase: 63 U/L (ref 38–126)
Anion gap: 6 (ref 5–15)
BUN: 13 mg/dL (ref 6–20)
CO2: 33 mmol/L — ABNORMAL HIGH (ref 22–32)
Calcium: 10.3 mg/dL (ref 8.9–10.3)
Chloride: 103 mmol/L (ref 98–111)
Creatinine: 1.21 mg/dL — ABNORMAL HIGH (ref 0.44–1.00)
GFR, Estimated: 53 mL/min — ABNORMAL LOW (ref >60)
Glucose, Bld: 129 mg/dL — ABNORMAL HIGH (ref 70–99)
Potassium: 3.7 mmol/L (ref 3.5–5.1)
Sodium: 142 mmol/L (ref 135–145)
Total Bilirubin: 0.6 mg/dL (ref <1.2)
Total Protein: 7.3 g/dL (ref 6.5–8.1)

## 2023-03-27 LAB — CBC WITH DIFFERENTIAL (CANCER CENTER ONLY)
Abs Immature Granulocytes: 0.03 10*3/uL (ref 0.00–0.07)
Basophils Absolute: 0 10*3/uL (ref 0.0–0.1)
Basophils Relative: 1 %
Eosinophils Absolute: 0 10*3/uL (ref 0.0–0.5)
Eosinophils Relative: 1 %
HCT: 29.9 % — ABNORMAL LOW (ref 36.0–46.0)
Hemoglobin: 10 g/dL — ABNORMAL LOW (ref 12.0–15.0)
Immature Granulocytes: 1 %
Lymphocytes Relative: 24 %
Lymphs Abs: 1 10*3/uL (ref 0.7–4.0)
MCH: 36.1 pg — ABNORMAL HIGH (ref 26.0–34.0)
MCHC: 33.4 g/dL (ref 30.0–36.0)
MCV: 107.9 fL — ABNORMAL HIGH (ref 80.0–100.0)
Monocytes Absolute: 0.4 10*3/uL (ref 0.1–1.0)
Monocytes Relative: 9 %
Neutro Abs: 2.8 10*3/uL (ref 1.7–7.7)
Neutrophils Relative %: 64 %
Platelet Count: 300 10*3/uL (ref 150–400)
RBC: 2.77 MIL/uL — ABNORMAL LOW (ref 3.87–5.11)
RDW: 17.2 % — ABNORMAL HIGH (ref 11.5–15.5)
WBC Count: 4.2 10*3/uL (ref 4.0–10.5)
nRBC: 0 % (ref 0.0–0.2)

## 2023-03-27 MED ORDER — PROCHLORPERAZINE MALEATE 10 MG PO TABS
10.0000 mg | ORAL_TABLET | Freq: Once | ORAL | Status: AC
  Administered 2023-03-27: 10 mg via ORAL
  Filled 2023-03-27: qty 1

## 2023-03-27 MED ORDER — SODIUM CHLORIDE 0.9 % IV SOLN
200.0000 mg | Freq: Once | INTRAVENOUS | Status: AC
  Administered 2023-03-27: 200 mg via INTRAVENOUS
  Filled 2023-03-27: qty 200

## 2023-03-27 MED ORDER — SODIUM CHLORIDE 0.9 % IV SOLN
500.0000 mg/m2 | Freq: Once | INTRAVENOUS | Status: AC
  Administered 2023-03-27: 1200 mg via INTRAVENOUS
  Filled 2023-03-27: qty 40

## 2023-03-27 MED ORDER — SODIUM CHLORIDE 0.9 % IV SOLN: Freq: Once | INTRAVENOUS | Status: AC

## 2023-03-27 MED ORDER — CYANOCOBALAMIN 1000 MCG/ML IJ SOLN
1000.0000 ug | Freq: Once | INTRAMUSCULAR | Status: AC
  Administered 2023-03-27: 1000 ug via INTRAMUSCULAR
  Filled 2023-03-27: qty 1

## 2023-03-27 NOTE — Patient Instructions (Signed)
[facility] - A DEPT OF [facility]  Discharge Instructions: Thank you for choosing [facility] to provide your oncology and hematology care.   If you have a lab appointment with the Cancer Center, please go directly to the Cancer Center and check in at the registration area.   Wear comfortable clothing and clothing appropriate for easy access to any Portacath or PICC line.   We strive to give you quality time with your provider. You may need to reschedule your appointment if you arrive late (15 or more minutes).  Arriving late affects you and other patients whose appointments are after yours.  Also, if you miss three or more appointments without notifying the office, you may be dismissed from the clinic at the provider's discretion.      For prescription refill requests, have your pharmacy contact our office and allow 72 hours for refills to be completed.    Today you received the following chemotherapy and/or immunotherapy agents: Keytruda / Alimta      To help prevent nausea and vomiting after your treatment, we encourage you to take your nausea medication as directed.  BELOW ARE SYMPTOMS THAT SHOULD BE REPORTED IMMEDIATELY: *FEVER GREATER THAN 100.4 F (38 C) OR HIGHER *CHILLS OR SWEATING *NAUSEA AND VOMITING THAT IS NOT CONTROLLED WITH YOUR NAUSEA MEDICATION *UNUSUAL SHORTNESS OF BREATH *UNUSUAL BRUISING OR BLEEDING *URINARY PROBLEMS (pain or burning when urinating, or frequent urination) *BOWEL PROBLEMS (unusual diarrhea, constipation, pain near the anus) TENDERNESS IN MOUTH AND THROAT WITH OR WITHOUT PRESENCE OF ULCERS (sore throat, sores in mouth, or a toothache) UNUSUAL RASH, SWELLING OR PAIN  UNUSUAL VAGINAL DISCHARGE OR ITCHING   Items with * indicate a potential emergency and should be followed up as soon as possible or go to the Emergency Department if any problems should occur.  Please show the CHEMOTHERAPY ALERT CARD or  IMMUNOTHERAPY ALERT CARD at check-in to the Emergency Department and triage nurse.  Should you have questions after your visit or need to cancel or reschedule your appointment, please contact [facility] - A DEPT OF [facility]  Dept: [phone]  and follow the prompts.  Office hours are 8:00 a.m. to 4:30 p.m. Monday - Friday. Please note that voicemails left after 4:00 p.m. may not be returned until the following business day.  We are closed weekends and major holidays. You have access to a nurse at all times for urgent questions. Please call the main number to the clinic Dept: [phone] and follow the prompts.   For any non-urgent questions, you may also contact your provider using MyChart. We now offer e-Visits for anyone 78 and older to request care online for non-urgent symptoms. For details visit [email or web address].   Also download the MyChart app! Go to the app store, search "MyChart", open the app, select [place], and log in with your MyChart username and password.

## 2023-03-27 NOTE — Progress Notes (Signed)
SUBJECTIVE:  Chief Complaint: Obesity  Interim History: Patient had chemo earlier today.  She had a PET scan on 02/12/23 and it was stable.  She is feeling mostly ok.  Some days she is more tired than others.  She is going to work every day and is keeping moving.  Patient is planning to stay home for Thanksgiving.  She saw Dr. Doneta Public on 03/19/23 and mentions she has not taken her cinacalcet due to memory.  Food wise she has been struggling.  The past week and half has been better.  She is getting between 60-70 grams of protein in daily.  She wasn't always eating enough to get more than that.  Eating is the most frustrating thing from her chemo.  She is experiencing more taste changes. She voices that she has had less desire to eat due to food no longer tasting good. With exception of bread she is able to tolerate a majority of food.  Planning to stay local for December.  Last indirect calorimetry done in 2023.    Madison Palmer is here to discuss her progress with her obesity treatment plan. She is on the practicing portion control and making smarter food choices, such as increasing vegetables and decreasing simple carbohydrates and states she is following her eating plan approximately 75 % of the time. She states she is not exercising.  OBJECTIVE: Visit Diagnoses: Problem List Items Addressed This Visit       Cardiovascular and Mediastinum   Essential hypertension - Primary    Patient BP well controlled today.  She sees Dr. Antoine Poche (cardiology).  She has been losing more consistently in the last few months and her BP has been consistently controlled.  She is has plans to decrease her amlodipine if her BP continues to trend down.  No chest pain, chest pressure, headache and no lightheadedness or dizziness.        Musculoskeletal and Integument   Dermatitis    Patient showed the anterior tibia today.  There is a hyperpigmented scaling/ peeling area of the shin but does not involve the ankle.  She  was previously treated for cellulitis with antibiotics and voices she does think that this improved the hyperpigmentation a bit.  She has a new patient appointment to see vascular coming up.  We discussed that she is experiencing hyperpigmentation in other areas such as her face.  The scaling could be from venous insufficiency but without further workup the etiology is unknown. Given her cancer patient was encouraged to not stay sedentary for too long as she is at higher risk of blood clot.  She voices she just picked up an ungent/ salve to put on the area to help with the peeling.       Other Visit Diagnoses     BMI 50.0-59.9, adult (HCC)       Obesity with starting BMI of 61.2           No data recorded  No data recorded  No data recorded  No data recorded    ASSESSMENT AND PLAN:  Diet: [name] is currently in the action stage of change. As such, her goal is to maintain weight for now. She has agreed to practicing portion control and making smarter food choices, such as increasing vegetables and decreasing simple carbohydrates.  Exercise: [name] has been instructed that some exercise is better than none for weight loss and overall health benefits.   Behavior Modification:  We discussed the following Behavioral Modification  Strategies today: increasing lean protein intake, increase H2O intake, no skipping meals, travel eating strategies, and holiday eating strategies.   No follow-ups on file.Marland Kitchen She was informed of the importance of frequent follow up visits to maximize her success with intensive lifestyle modifications for her multiple health conditions.  Attestation Statements:   Reviewed by clinician on day of visit: allergies, medications, problem list, medical history, surgical history, family history, social history, and previous encounter notes.      Reuben Likes, MD

## 2023-03-27 NOTE — Patient Instructions (Signed)
Please use eucerin or aquaphor at least twice a day for your dry skin.

## 2023-03-27 NOTE — Assessment & Plan Note (Signed)
Patient BP well controlled today.  She sees Dr. Antoine Poche (cardiology).  She has been losing more consistently in the last few months and her BP has been consistently controlled.  She is has plans to decrease her amlodipine if her BP continues to trend down.  No chest pain, chest pressure, headache and no lightheadedness or dizziness.

## 2023-03-28 LAB — T4: T4, Total: 11.9 ug/dL (ref 4.5–12.0)

## 2023-04-02 ENCOUNTER — Other Ambulatory Visit: Payer: Self-pay | Admitting: *Deleted

## 2023-04-02 DIAGNOSIS — M79604 Pain in right leg: Secondary | ICD-10-CM

## 2023-04-03 ENCOUNTER — Other Ambulatory Visit: Payer: Self-pay | Admitting: Physician Assistant

## 2023-04-03 ENCOUNTER — Other Ambulatory Visit (HOSPITAL_BASED_OUTPATIENT_CLINIC_OR_DEPARTMENT_OTHER): Payer: Self-pay | Admitting: Cardiology

## 2023-04-03 DIAGNOSIS — L309 Dermatitis, unspecified: Secondary | ICD-10-CM | POA: Insufficient documentation

## 2023-04-03 NOTE — Assessment & Plan Note (Signed)
Patient showed the anterior tibia today.  There is a hyperpigmented scaling/ peeling area of the shin but does not involve the ankle.  She was previously treated for cellulitis with antibiotics and voices she does think that this improved the hyperpigmentation a bit.  She has a new patient appointment to see vascular coming up.  We discussed that she is experiencing hyperpigmentation in other areas such as her face.  The scaling could be from venous insufficiency but without further workup the etiology is unknown. Given her cancer patient was encouraged to not stay sedentary for too long as she is at higher risk of blood clot.  She voices she just picked up an ungent/ salve to put on the area to help with the peeling.

## 2023-04-08 ENCOUNTER — Other Ambulatory Visit (HOSPITAL_COMMUNITY): Payer: Self-pay | Admitting: Psychiatry

## 2023-04-08 DIAGNOSIS — F319 Bipolar disorder, unspecified: Secondary | ICD-10-CM

## 2023-04-08 DIAGNOSIS — R251 Tremor, unspecified: Secondary | ICD-10-CM

## 2023-04-10 ENCOUNTER — Other Ambulatory Visit: Payer: Self-pay | Admitting: Cardiology

## 2023-04-10 ENCOUNTER — Telehealth (HOSPITAL_BASED_OUTPATIENT_CLINIC_OR_DEPARTMENT_OTHER): Payer: Medicare Other | Admitting: Psychiatry

## 2023-04-10 ENCOUNTER — Encounter (HOSPITAL_COMMUNITY): Payer: Self-pay | Admitting: Psychiatry

## 2023-04-10 ENCOUNTER — Ambulatory Visit: Payer: BC Managed Care – PPO

## 2023-04-10 VITALS — Wt 282.0 lb

## 2023-04-10 DIAGNOSIS — R251 Tremor, unspecified: Secondary | ICD-10-CM

## 2023-04-10 DIAGNOSIS — F319 Bipolar disorder, unspecified: Secondary | ICD-10-CM

## 2023-04-10 DIAGNOSIS — F419 Anxiety disorder, unspecified: Secondary | ICD-10-CM | POA: Diagnosis not present

## 2023-04-10 MED ORDER — BENZTROPINE MESYLATE 0.5 MG PO TABS: 0.5000 mg | ORAL_TABLET | Freq: Every day | ORAL | 2 refills | Status: DC

## 2023-04-10 MED ORDER — LURASIDONE HCL 40 MG PO TABS: 40.0000 mg | ORAL_TABLET | Freq: Every day | ORAL | 2 refills | Status: DC

## 2023-04-10 NOTE — Progress Notes (Signed)
[facility] Progress Note   Patient Location: Home Provider Location: Home Office  I connect with patient by telephone and verified that I am speaking with correct person by using two identifiers. I discussed the limitations of evaluation and management by telemedicine and the availability of in person appointments. I also discussed with the patient that there may be a patient responsible charge related to this service. The patient expressed understanding and agreed to proceed.  OTA RACER [id number] 57 y.o.  04/10/2023 10:59 AM  History of Present Illness:  Patient is evaluated by: Session.  She reported things are stable.  Her son started working part-time and she is happy about it.  She tried to keep herself busy.  She reported her cancer is also under control as recently had a visit with her oncologist.  She denies any crying spells, feeling of hopelessness or worthlessness.  She lost weight because sometimes she does not have appetite.  She is compliant with Kasandra Knudsen is helping her mood, anger, impulsive control.  Her tremors are also much better with the Cogentin.  She uses CPAP is helping her sleep.  She wants to keep the current medication.  She had a good Thanksgiving as she cooked with her son and she feels good about it.  Past Psychiatric History: H/O depression and inpatient at [facility] in May [address] for delusion, psychosis and violent behavior. D/C on Depakote 500 mg BID, Latuda 60 mg daily and Trilafon 12 mg a day. No h/o suicidal attempt, nightmares, panic attack, OCD or substance use.      Outpatient Encounter Medications as of 04/10/2023  Medication Sig   acetaminophen (TYLENOL) 500 MG tablet Take 1,000 mg by mouth 3 (three) times daily as needed for moderate pain or headache.   amLODipine (NORVASC) 10 MG tablet TAKE 1 TABLET BY MOUTH EVERY DAY   atorvastatin (LIPITOR) 20 MG tablet TAKE 1 TABLET(20 MG) BY MOUTH 3 TIMES A WEEK    benztropine (COGENTIN) 0.5 MG tablet Take 1 tablet (0.5 mg total) by mouth at bedtime.   cetirizine (ZYRTEC) 10 MG tablet TAKE 1 TABLET BY MOUTH EVERY DAY   Cholecalciferol (VITAMIN D3 MAXIMUM STRENGTH) 125 MCG (5000 UT) capsule Take 5,000 Units by mouth daily.   cinacalcet (SENSIPAR) 30 MG tablet Take 30 mg by mouth daily.   cloNIDine (CATAPRES) 0.1 MG tablet TAKE 1 TABLET(0.1 MG) BY MOUTH THREE TIMES DAILY   esomeprazole (NEXIUM) 40 MG capsule TAKE 1 CAPSULE BY MOUTH TWICE DAILY   fenofibrate (TRICOR) 145 MG tablet TAKE 1 TABLET(145 MG) BY MOUTH DAILY   folic acid (FOLVITE) 1 MG tablet TAKE 1 TABLET BY MOUTH DAILY   furosemide (LASIX) 40 MG tablet TAKE 1 TABLET BY MOUTH EVERY DAY   gabapentin (NEURONTIN) 100 MG capsule TAKE 2 CAPSULES(200 MG) BY MOUTH AT BEDTIME   labetalol (NORMODYNE) 300 MG tablet TAKE 1 AND 1/2 TABLETS BY MOUTH TWICE DAILY   levothyroxine (SYNTHROID) 88 MCG tablet Take 88 mcg by mouth daily.   lisinopril (ZESTRIL) 40 MG tablet TAKE 1 TABLET BY MOUTH EVERY DAY   lurasidone (LATUDA) 40 MG TABS tablet Take 1 tablet (40 mg total) by mouth daily with breakfast.   Polyethylene Glycol 3350 (MIRALAX PO) Take 1 Capful by mouth daily as needed (Constipation).   Potassium Chloride ER 20 MEQ TBCR TAKE 1 TABLET BY MOUTH EVERY DAY   prochlorperazine (COMPAZINE) 10 MG tablet Take 1 tablet (10 mg total) by mouth every 6 (  six) hours as needed.   TART CHERRY PO Take 1 tablet by mouth daily.   No facility-administered encounter medications on file as of 04/10/2023.    Recent Results (from the past 2160 hour(s))  CMP (Cancer Center only)     Status: Abnormal   Collection Time: 01/23/23 10:49 AM  Result Value Ref Range   Sodium 141 135 - 145 mmol/L   Potassium 3.8 3.5 - 5.1 mmol/L   Chloride 103 98 - 111 mmol/L   CO2 31 22 - 32 mmol/L   Glucose, Bld 118 (H) 70 - 99 mg/dL    Comment: Glucose reference range applies only to samples taken after fasting for at least 8 hours.   BUN 9 6  - 20 mg/dL   Creatinine 1.47 (H) 8.29 - 1.00 mg/dL   Calcium 9.2 8.9 - 56.2 mg/dL   Total Protein 6.8 6.5 - 8.1 g/dL   Albumin 3.7 3.5 - 5.0 g/dL   AST 19 15 - 41 U/L   ALT 13 0 - 44 U/L   Alkaline Phosphatase 65 38 - 126 U/L   Total Bilirubin 0.5 0.3 - 1.2 mg/dL   GFR, Estimated 58 (L) >60 mL/min    Comment: (NOTE) Calculated using the CKD-EPI Creatinine Equation (2021)    Anion gap 7 5 - 15    Comment: Performed at [facility], [address], [place], [place] [address]  CBC with Differential (Cancer Center Only)     Status: Abnormal   Collection Time: 01/23/23 10:49 AM  Result Value Ref Range   WBC Count 5.1 4.0 - 10.5 K/uL   RBC 3.16 (L) 3.87 - 5.11 MIL/uL   Hemoglobin 10.2 (L) 12.0 - 15.0 g/dL   HCT 57.8 (L) 46.9 - 62.9 %   MCV 97.2 80.0 - 100.0 fL   MCH 32.3 26.0 - 34.0 pg   MCHC 33.2 30.0 - 36.0 g/dL   RDW 52.8 (H) 41.3 - 24.4 %   Platelet Count 350 150 - 400 K/uL   nRBC 0.0 0.0 - 0.2 %   Neutrophils Relative % 66 %   Neutro Abs 3.3 1.7 - 7.7 K/uL   Lymphocytes Relative 24 %   Lymphs Abs 1.2 0.7 - 4.0 K/uL   Monocytes Relative 9 %   Monocytes Absolute 0.5 0.1 - 1.0 K/uL   Eosinophils Relative 1 %   Eosinophils Absolute 0.1 0.0 - 0.5 K/uL   Basophils Relative 0 %   Basophils Absolute 0.0 0.0 - 0.1 K/uL   Immature Granulocytes 0 %   Abs Immature Granulocytes 0.02 0.00 - 0.07 K/uL    Comment: Performed at [facility], [address], [place], [place] [address]  POCT glycosylated hemoglobin (Hb A1C)     Status: Abnormal   Collection Time: 02/01/23  1:23 PM  Result Value Ref Range   Hemoglobin A1C 6.0 (A) 4.0 - 5.6 %   HbA1c POC (<> result, manual entry) 6.0 4.0 - 5.6 %   HbA1c, POC (prediabetic range) 6.0 5.7 - 6.4 %   HbA1c, POC (controlled diabetic range) 6.0 0.0 - 7.0 %  TSH     Status: None   Collection Time: 02/13/23  9:16 AM  Result Value Ref Range   TSH 1.975 0.350 - 4.500 uIU/mL    Comment: Performed by  a 3rd Generation assay with a functional sensitivity of <=0.01 uIU/mL. Performed at [facility], [address] [place], [place] [address]   T4  Status: None   Collection Time: 02/13/23  9:17 AM  Result Value Ref Range   T4, Total 11.9 4.5 - 12.0 ug/dL    Comment: (NOTE) Performed At: [facility] [address] [place], [place] [id number] Jolene Schimke Palmer [id number]   CMP (Cancer Center only)     Status: Abnormal   Collection Time: 02/13/23  9:17 AM  Result Value Ref Range   Sodium 140 135 - 145 mmol/L   Potassium 4.0 3.5 - 5.1 mmol/L   Chloride 104 98 - 111 mmol/L   CO2 29 22 - 32 mmol/L   Glucose, Bld 108 (H) 70 - 99 mg/dL    Comment: Glucose reference range applies only to samples taken after fasting for at least 8 hours.   BUN 11 6 - 20 mg/dL   Creatinine 4.40 (H) 1.02 - 1.00 mg/dL   Calcium 9.9 8.9 - 72.5 mg/dL   Total Protein 7.2 6.5 - 8.1 g/dL   Albumin 3.7 3.5 - 5.0 g/dL   AST 18 15 - 41 U/L   ALT 9 0 - 44 U/L   Alkaline Phosphatase 67 38 - 126 U/L   Total Bilirubin 0.5 0.3 - 1.2 mg/dL   GFR, Estimated 43 (L) >60 mL/min    Comment: (NOTE) Calculated using the CKD-EPI Creatinine Equation (2021)    Anion gap 7 5 - 15    Comment: Performed at [facility], [address] [place], [place] [address]  CBC with Differential (Cancer Center Only)     Status: Abnormal   Collection Time: 02/13/23  9:17 AM  Result Value Ref Range   WBC Count 3.8 (L) 4.0 - 10.5 K/uL   RBC 2.88 (L) 3.87 - 5.11 MIL/uL   Hemoglobin 10.0 (L) 12.0 - 15.0 g/dL   HCT 03.4 (L) 74.2 - 59.5 %   MCV 102.4 (H) 80.0 - 100.0 fL   MCH 34.7 (H) 26.0 - 34.0 pg   MCHC 33.9 30.0 - 36.0 g/dL   RDW 63.8 (H) 75.6 - 43.3 %   Platelet Count 371 150 - 400 K/uL   nRBC 0.0 0.0 - 0.2 %   Neutrophils Relative % 57 %   Neutro Abs 2.2 1.7 - 7.7 K/uL   Lymphocytes Relative 27 %   Lymphs Abs 1.0 0.7 - 4.0 K/uL   Monocytes Relative 11 %   Monocytes  Absolute 0.4 0.1 - 1.0 K/uL   Eosinophils Relative 3 %   Eosinophils Absolute 0.1 0.0 - 0.5 K/uL   Basophils Relative 1 %   Basophils Absolute 0.0 0.0 - 0.1 K/uL   Immature Granulocytes 1 %   Abs Immature Granulocytes 0.02 0.00 - 0.07 K/uL    Comment: Performed at [facility], [address], [place], [place] [address]  TSH     Status: None   Collection Time: 03/06/23 11:21 AM  Result Value Ref Range   TSH 1.604 0.350 - 4.500 uIU/mL    Comment: Performed by a 3rd Generation assay with a functional sensitivity of <=0.01 uIU/mL. Performed at [facility], [address] [place], [place] [address]   T4     Status: None   Collection Time: 03/06/23 11:22 AM  Result Value Ref Range   T4, Total 12.0 4.5 - 12.0 ug/dL    Comment: (NOTE) Performed At: [facility] [address] [place], [place] [id number] Jolene Schimke Palmer XN:[id number]   CMP (Cancer Center only)     Status: Abnormal  Collection Time: 03/06/23 11:22 AM  Result Value Ref Range   Sodium 142 135 - 145 mmol/L   Potassium 3.8 3.5 - 5.1 mmol/L   Chloride 103 98 - 111 mmol/L   CO2 33 (H) 22 - 32 mmol/L   Glucose, Bld 123 (H) 70 - 99 mg/dL    Comment: Glucose reference range applies only to samples taken after fasting for at least 8 hours.   BUN 15 6 - 20 mg/dL   Creatinine 3.23 (H) 5.57 - 1.00 mg/dL   Calcium 9.8 8.9 - 32.2 mg/dL   Total Protein 7.2 6.5 - 8.1 g/dL   Albumin 3.7 3.5 - 5.0 g/dL   AST 26 15 - 41 U/L   ALT 21 0 - 44 U/L   Alkaline Phosphatase 62 38 - 126 U/L   Total Bilirubin 0.5 <1.2 mg/dL   GFR, Estimated 55 (L) >60 mL/min    Comment: (NOTE) Calculated using the CKD-EPI Creatinine Equation (2021)    Anion gap 6 5 - 15    Comment: Performed at [facility], [address], [place], [place] [address]  CBC with Differential (Cancer Center Only)     Status: Abnormal   Collection Time: 03/06/23 11:22 AM  Result Value Ref Range    WBC Count 4.0 4.0 - 10.5 K/uL   RBC 2.59 (L) 3.87 - 5.11 MIL/uL   Hemoglobin 9.5 (L) 12.0 - 15.0 g/dL   HCT 70.6 (L) 23.7 - 62.8 %   MCV 106.2 (H) 80.0 - 100.0 fL   MCH 36.7 (H) 26.0 - 34.0 pg   MCHC 34.5 30.0 - 36.0 g/dL   RDW 31.5 (H) 17.6 - 16.0 %   Platelet Count 281 150 - 400 K/uL   nRBC 0.0 0.0 - 0.2 %   Neutrophils Relative % 58 %   Neutro Abs 2.3 1.7 - 7.7 K/uL   Lymphocytes Relative 25 %   Lymphs Abs 1.0 0.7 - 4.0 K/uL   Monocytes Relative 12 %   Monocytes Absolute 0.5 0.1 - 1.0 K/uL   Eosinophils Relative 3 %   Eosinophils Absolute 0.1 0.0 - 0.5 K/uL   Basophils Relative 1 %   Basophils Absolute 0.0 0.0 - 0.1 K/uL   Immature Granulocytes 1 %   Abs Immature Granulocytes 0.04 0.00 - 0.07 K/uL    Comment: Performed at [facility], [address], [place], [place] [address]  Urinalysis, Routine w reflex microscopic     Status: None   Collection Time: 03/08/23 12:00 AM  Result Value Ref Range   Specific Gravity, UA 1.015 1.005 - 1.030   pH, UA 7.0 5.0 - 7.5   Color, UA Yellow Yellow   Appearance Ur Clear Clear   Leukocytes,UA Negative Negative   Protein,UA Negative Negative/Trace   Glucose, UA Negative Negative   Ketones, UA Negative Negative   RBC, UA Negative Negative   Bilirubin, UA Negative Negative   Urobilinogen, Ur 0.2 0.2 - 1.0 mg/dL   Nitrite, UA Negative Negative   Microscopic Examination Comment     Comment: Microscopic not indicated and not performed.  BLADDER SCAN AMB NON-IMAGING     Status: None   Collection Time: 03/08/23 12:00 AM  Result Value Ref Range   Scan Result 5ml   Glucose, capillary     Status: None   Collection Time: 03/15/23  3:09 PM  Result Value Ref Range   Glucose-Capillary 97 70 - 99 mg/dL    Comment: Glucose reference  range applies only to samples taken after fasting for at least 8 hours.  TSH     Status: None   Collection Time: 03/27/23  8:11 AM  Result Value Ref Range   TSH 1.444 0.350 - 4.500  uIU/mL    Comment: Performed by a 3rd Generation assay with a functional sensitivity of <=0.01 uIU/mL. Performed at [facility], [address] [place], [place] [address]   T4     Status: None   Collection Time: 03/27/23  8:11 AM  Result Value Ref Range   T4, Total 11.9 4.5 - 12.0 ug/dL    Comment: (NOTE) Performed At: [facility] [address] [place], [place] [id number] Jolene Schimke Palmer QI:[id number]   CMP (Cancer Center only)     Status: Abnormal   Collection Time: 03/27/23  8:11 AM  Result Value Ref Range   Sodium 142 135 - 145 mmol/L   Potassium 3.7 3.5 - 5.1 mmol/L   Chloride 103 98 - 111 mmol/L   CO2 33 (H) 22 - 32 mmol/L   Glucose, Bld 129 (H) 70 - 99 mg/dL    Comment: Glucose reference range applies only to samples taken after fasting for at least 8 hours.   BUN 13 6 - 20 mg/dL   Creatinine 3.24 (H) 4.01 - 1.00 mg/dL   Calcium 02.7 8.9 - 25.3 mg/dL   Total Protein 7.3 6.5 - 8.1 g/dL   Albumin 3.9 3.5 - 5.0 g/dL   AST 26 15 - 41 U/L   ALT 29 0 - 44 U/L   Alkaline Phosphatase 63 38 - 126 U/L   Total Bilirubin 0.6 <1.2 mg/dL   GFR, Estimated 53 (L) >60 mL/min    Comment: (NOTE) Calculated using the CKD-EPI Creatinine Equation (2021)    Anion gap 6 5 - 15    Comment: Performed at [facility], [address], [place], [place] [address]  CBC with Differential (Cancer Center Only)     Status: Abnormal   Collection Time: 03/27/23  8:11 AM  Result Value Ref Range   WBC Count 4.2 4.0 - 10.5 K/uL   RBC 2.77 (L) 3.87 - 5.11 MIL/uL   Hemoglobin 10.0 (L) 12.0 - 15.0 g/dL   HCT 34.7 (L) 42.5 - 95.6 %   MCV 107.9 (H) 80.0 - 100.0 fL   MCH 36.1 (H) 26.0 - 34.0 pg   MCHC 33.4 30.0 - 36.0 g/dL   RDW 38.7 (H) 56.4 - 33.2 %   Platelet Count 300 150 - 400 K/uL   nRBC 0.0 0.0 - 0.2 %   Neutrophils Relative % 64 %   Neutro Abs 2.8 1.7 - 7.7 K/uL   Lymphocytes Relative 24 %   Lymphs Abs 1.0 0.7 - 4.0 K/uL   Monocytes  Relative 9 %   Monocytes Absolute 0.4 0.1 - 1.0 K/uL   Eosinophils Relative 1 %   Eosinophils Absolute 0.0 0.0 - 0.5 K/uL   Basophils Relative 1 %   Basophils Absolute 0.0 0.0 - 0.1 K/uL   Immature Granulocytes 1 %   Abs Immature Granulocytes 0.03 0.00 - 0.07 K/uL    Comment: Performed at [facility], [address], [place], [place] [address]     Psychiatric Specialty Exam: Physical Exam  Review of Systems  Constitutional:  Positive for appetite change.    Weight 282 lb (127.9 kg), last menstrual period 05/02/2017.There is no height or weight on file to calculate BMI.  General  Appearance: NA  Eye Contact:  NA  Speech:  Normal Rate  Volume:  Normal  Mood:  Euthymic  Affect:  NA  Thought Process:  Goal Directed  Orientation:  Full (Time, Place, and Person)  Thought Content:  Logical  Suicidal Thoughts:  No  Homicidal Thoughts:  No  Memory:  Immediate;   Good Recent;   Good Remote;   Good  Judgement:  Intact  Insight:  Present  Psychomotor Activity:  NA  Concentration:  Concentration: Good and Attention Span: Fair  Recall:  Good  Fund of Knowledge:  Good  Language:  Good  Akathisia:  No  Handed:  Right  AIMS (if indicated):     Assets:  Communication Skills Desire for Improvement Housing Social Support  ADL's:  Intact  Cognition:  WNL  Sleep:  ok, using CPAP     Assessment/Plan: Bipolar 1 disorder (HCC) - Plan: benztropine (COGENTIN) 0.5 MG tablet, lurasidone (LATUDA) 40 MG TABS tablet  Tremor - Plan: benztropine (COGENTIN) 0.5 MG tablet  Anxiety - Plan: lurasidone (LATUDA) 40 MG TABS tablet  Patient stable on current medication.  Her tremors are better with Cogentin.  She is pleased that her cancer is also under control.  She wants to continue current medication.  No major concern.  Continue Latuda 40 mg daily and Cogentin 0.5 mg at bedtime.  Recommended to call us back if she has any question or any concern.  She is using CPAP is  helping her sleep.  Follow-up in 3 months   Follow Up Instructions:     I discussed the assessment and treatment plan with the patient. The patient was provided an opportunity to ask questions and all were answered. The patient agreed with the plan and demonstrated an understanding of the instructions.   The patient was advised to call back or seek an in-person evaluation if the symptoms worsen or if the condition fails to improve as anticipated.    Collaboration of Care: Other provider involved in patient's care AEB notes are available in epic to review  Patient/Guardian was advised Release of Information must be obtained prior to any record release in order to collaborate their care with an outside provider. Patient/Guardian was advised if they have not already done so to contact the registration department to sign all necessary forms in order for [place] to release information regarding their care.   Consent: Patient/Guardian gives verbal consent for treatment and assignment of benefits for services provided during this visit. Patient/Guardian expressed understanding and agreed to proceed.     I provided 15 minutes of non face to face time during this encounter.  Note: This document was prepared by [employer] voice dictation technology and any errors that results from this process are unintentional.    Cleotis Nipper, Palmer 04/10/2023

## 2023-04-12 ENCOUNTER — Ambulatory Visit (HOSPITAL_COMMUNITY)
Admission: RE | Admit: 2023-04-12 | Discharge: 2023-04-12 | Disposition: A | Discharge status: Home / Self Care | Payer: BC Managed Care – PPO | Source: Ambulatory Visit | Attending: Vascular Surgery | Admitting: Vascular Surgery

## 2023-04-12 ENCOUNTER — Ambulatory Visit: Payer: BC Managed Care – PPO | Admitting: Physician Assistant

## 2023-04-12 VITALS — BP 117/63 | HR 71 | Temp 98.0°F | Resp 20 | Ht 62.0 in | Wt 282.0 lb

## 2023-04-12 DIAGNOSIS — M79604 Pain in right leg: Secondary | ICD-10-CM

## 2023-04-12 DIAGNOSIS — M7989 Other specified soft tissue disorders: Secondary | ICD-10-CM

## 2023-04-12 NOTE — Progress Notes (Signed)
[facility] OFFICE PROGRESS NOTE  [name], [name], NP [address] [place] [address]  DIAGNOSIS: Stage IV non-small cell lung cancer, adenocarcinoma (T2a, N3, M1c).  She presented with bilateral lung nodules, thoracic adenopathy and bilobar liver lesions.  She was diagnosed in May 2024.     PDL1: 0   Guardant 360: Negative for any actionable mutations   Foundation One: No actionable mutations   PRIOR THERAPY: None  CURRENT THERAPY: Palliative systemic chemotherapy and immunotherapy with carboplatin for an AUC of 5, alimta 500 mg/m2, and Keytruda 200 mg IV every 3 weeks. First dose on 10/10/22. Status post 9 cycles. Starting from cycle #5 the patient is on maintenance treatment with Alimta and Keytruda every 3 weeks   INTERVAL HISTORY: Madison Palmer 57 y.o. female returns to the clinic today for a follow up visit accompanied by her son.  The patient was last seen 3 weeks ago by myself.  She is currently on maintenance immunotherapy and chemotherapy. She does experience fatigue with treatment. Overall, she thinks she tolerated the last cycle well since she cannot recall any usual symptoms.   At her last appointment, she had residual hyperpigmentation and dry a skin after right lower extremity cellulitis. She has been applying lotion. She saw vascular on 04/12/23. They recommended compression stockings.   Patient's anemia seems to be worse today.  In retrospect, the patient did feel a little bit more rundown this past week as she typically recovers after her last cycle of treatment by this time.  The patient has not needed blood transfusion in the past.  She does have a history of iron deficiency but is not taking iron at this time.  She sometimes forgets to take her folic acid.  She is not taking any blood thinners.  Her last colonoscopy was in May and she states there was no concerns.  She does not take any NSAIDs at this time and takes Tylenol if needed.  She  denies any abnormal bleeding or bruising or dark stools.    She denies any fever or night sweats.  She denies any significant shortness of breath.  Denies any hemoptysis or chest pain.  She sometimes has some mild cough but she states is not significant.  She denies any nausea, vomiting, or diarrhea. Denies constipation.  She states she is trying to eat better and gained weight.  She denies any headache .  She has an eye doctor appointment later today due to some increased blurry vision looking at distances.  She is here today for evaluation before undergoing cycle #10    MEDICAL HISTORY: Past Medical History:  Diagnosis Date   Acute on chronic respiratory failure with hypoxia and hypercapnia (HCC) 09/15/2018   Anemia    Anxiety    Arrhythmia    tachycardia   Arthritis    Bipolar 1 disorder (HCC) 09/12/2018   Bipolar I disorder, current or most recent episode manic, with psychotic features (HCC)    Brief psychotic disorder (HCC) 08/22/2018   Chronic respiratory failure with hypoxia (HCC) 05/10/2018   Formatting of this note might be different from the original. Last Assessment & Plan:  Continue 1 L continuous on exertion and during sleep. On her next visit we will check OSA on CPAP/room air to decide whether she needs to take oxygen along with her on her cruise in May   Depression    Diverticulitis    Generalized edema 03/12/2018   GERD (gastroesophageal reflux disease)  Glaucoma    Hyperlipidemia    Hyperparathyroidism (HCC)    Hypertension    Inflammatory polyps of colon (HCC)    Leg swelling 01/14/2019   LVH (left ventricular hypertrophy)    Lymphedema    Morbid (severe) obesity due to excess calories (HCC) 05/11/2017   PCOS (polycystic ovarian syndrome)    Peripheral vascular disease (HCC)    Prediabetes    Recurrent UTI    Sleep apnea    CPAP   Thyroid disease    Vitamin D deficiency     ALLERGIES:  is allergic to other, fentanyl, levofloxacin, midazolam, pollen  extract, atorvastatin, hydralazine hcl, and rosuvastatin.  MEDICATIONS:  Current Outpatient Medications  Medication Sig Dispense Refill   acetaminophen (TYLENOL) 500 MG tablet Take 1,000 mg by mouth 3 (three) times daily as needed for moderate pain or headache.     amLODipine (NORVASC) 10 MG tablet TAKE 1 TABLET BY MOUTH EVERY DAY 90 tablet 3   atorvastatin (LIPITOR) 20 MG tablet TAKE 1 TABLET(20 MG) BY MOUTH 3 TIMES A WEEK 12 tablet 2   benztropine (COGENTIN) 0.5 MG tablet Take 1 tablet (0.5 mg total) by mouth at bedtime. 30 tablet 2   cetirizine (ZYRTEC) 10 MG tablet TAKE 1 TABLET BY MOUTH EVERY DAY 90 tablet 1   Cholecalciferol (VITAMIN D3 MAXIMUM STRENGTH) 125 MCG (5000 UT) capsule Take 5,000 Units by mouth daily.     cinacalcet (SENSIPAR) 30 MG tablet Take 30 mg by mouth daily.     cloNIDine (CATAPRES) 0.1 MG tablet TAKE 1 TABLET(0.1 MG) BY MOUTH THREE TIMES DAILY 90 tablet 3   esomeprazole (NEXIUM) 40 MG capsule TAKE 1 CAPSULE BY MOUTH TWICE DAILY 180 capsule 2   fenofibrate (TRICOR) 145 MG tablet TAKE 1 TABLET(145 MG) BY MOUTH DAILY 90 tablet 3   folic acid (FOLVITE) 1 MG tablet TAKE 1 TABLET BY MOUTH DAILY 30 tablet 2   furosemide (LASIX) 40 MG tablet TAKE 1 TABLET BY MOUTH EVERY DAY 90 tablet 3   gabapentin (NEURONTIN) 100 MG capsule TAKE 2 CAPSULES(200 MG) BY MOUTH AT BEDTIME 180 capsule 3   labetalol (NORMODYNE) 300 MG tablet TAKE 1 AND 1/2 TABLETS BY MOUTH TWICE DAILY 270 tablet 0   levothyroxine (SYNTHROID) 88 MCG tablet Take 88 mcg by mouth daily.     lisinopril (ZESTRIL) 40 MG tablet TAKE 1 TABLET BY MOUTH EVERY DAY 90 tablet 1   lurasidone (LATUDA) 40 MG TABS tablet Take 1 tablet (40 mg total) by mouth daily with breakfast. 30 tablet 2   Polyethylene Glycol 3350 (MIRALAX PO) Take 1 Capful by mouth daily as needed (Constipation).     Potassium Chloride ER 20 MEQ TBCR TAKE 1 TABLET BY MOUTH EVERY DAY 90 tablet 2   prochlorperazine (COMPAZINE) 10 MG tablet Take 1 tablet (10 mg  total) by mouth every 6 (six) hours as needed. 30 tablet 2   TART CHERRY PO Take 1 tablet by mouth daily.     No current facility-administered medications for this visit.    SURGICAL HISTORY:  Past Surgical History:  Procedure Laterality Date   BIOPSY  09/27/2022   Procedure: BIOPSY;  Surgeon: Mansouraty, Netty Starring., MD;  Location: [facility] END[facility]COPY;  Service: Gastroenterology;;   BREAST BIOPSY  2015   CESAREAN SECTION  2004   CHOLECYSTECTOMY     COLONOSCOPY WITH PROPOFOL N/A 09/27/2022   Procedure: COLONOSCOPY WITH PROPOFOL;  Surgeon: Lemar Lofty., MD;  Location: [facility] ENDOSCOPY;  Service: Gastroenterology;  Laterality: N/A;  ESOPHAGOGASTRODUODENOSCOPY (EGD) WITH PROPOFOL N/A 09/27/2022   Procedure: ESOPHAGOGASTRODUODENOSCOPY (EGD) WITH PROPOFOL;  Surgeon: Meridee Score Netty Starring., MD;  Location: [facility] ENDOSCOPY;  Service: Gastroenterology;  Laterality: N/A;   INNER EAR SURGERY     ear and sinus surgery   LAPAROSCOPIC REPAIR AND REMOVAL OF GASTRIC BAND     MALONEY DILATION  09/27/2022   Procedure: MALONEY DILATION;  Surgeon: Lemar Lofty., MD;  Location: [facility] ENDOSCOPY;  Service: Gastroenterology;;   OOPHORECTOMY Left    POLYPECTOMY  09/27/2022   Procedure: POLYPECTOMY;  Surgeon: Lemar Lofty., MD;  Location: [facility] ENDOSCOPY;  Service: Gastroenterology;;   TONSILLECTOMY AND ADENOIDECTOMY      REVIEW OF SYSTEMS:   Constitutional: Positive for fatigue. Negative for appetite change, chills, fever and unexpected weight change.  HENT: Negative for mouth sores, nosebleeds, sore throat and trouble swallowing.   Eyes: Negative for eye problems and icterus.  Respiratory: Positive for mild intermittent cough. Negative for hemoptysis, shortness of breath and wheezing.   Cardiovascular: Negative for chest pain and leg swelling.  Gastrointestinal: Negative for abdominal pain, constipation, diarrhea, nausea and vomiting.  Genitourinary: Negative for bladder incontinence, difficulty  urinating, dysuria, frequency and hematuria.   Musculoskeletal: Negative for back pain, gait problem, neck pain and neck stiffness.  Skin: Positive for dry skin and hyperpigmentation on her right lower extremity.  Neurological: Negative for dizziness, extremity weakness, gait problem, headaches, light-headedness and seizures.  Hematological: Negative for adenopathy. Does not bruise/bleed easily.  Psychiatric/Behavioral: Negative for confusion, depression and sleep disturbance. The patient is not nervous/anxious.     PHYSICAL EXAMINATION:  Blood pressure 116/83, pulse 76, temperature 97.7 F (36.5 C), temperature source Temporal, resp. rate 20, weight 286 lb (129.7 kg), last menstrual period 05/02/2017.  ECOG PERFORMANCE STATUS: 1  Physical Exam  Constitutional: Oriented to person, place, and time and well-developed, well-nourished, and in no distress.  HENT:  Head: Normocephalic and atraumatic.  Mouth/Throat: Oropharynx is clear and moist. No oropharyngeal exudate.  Eyes: Conjunctivae are normal. Right eye exhibits no discharge. Left eye exhibits no discharge. No scleral icterus.  Neck: Normal range of motion. Neck supple.  Cardiovascular: Normal rate, regular rhythm, normal heart sounds and intact distal pulses.   Pulmonary/Chest: Effort normal and breath sounds normal. No respiratory distress. No wheezes. No rales.  Abdominal: Soft. Bowel sounds are normal. Exhibits no distension and no mass. There is no tenderness.  Musculoskeletal: Normal range of motion. Exhibits no edema.  Lymphadenopathy:    No cervical adenopathy.  Neurological: Alert and oriented to person, place, and time. Exhibits normal muscle tone. Gait normal. Coordination normal.  Skin: Skin is warm and dry. Positive for hyperpigmentation and dry cracked skin on right lower extremity. No warmth, swelling, or erythema. Not diaphoretic.  No pallor.  Psychiatric: Mood, memory and judgment normal.  Vitals  reviewed.  LABORATORY DATA: Lab Results  Component Value Date   WBC 4.0 04/17/2023   HGB 8.2 (L) 04/17/2023   HCT 25.1 (L) 04/17/2023   MCV 110.1 (H) 04/17/2023   PLT 281 04/17/2023      Chemistry      Component Value Date/Time   NA 142 03/27/2023 0811   NA 145 09/08/2021 0000   K 3.7 03/27/2023 0811   CL 103 03/27/2023 0811   CO2 33 (H) 03/27/2023 0811   BUN 13 03/27/2023 0811   BUN 36 (H) 08/22/2021 1606   CREATININE 1.21 (H) 03/27/2023 0811   CREATININE 1.13 (H) 12/24/2018 1402   GLU 97 09/08/2021 0000  Component Value Date/Time   CALCIUM 10.3 03/27/2023 0811   ALKPHOS 63 03/27/2023 0811   AST 26 03/27/2023 0811   ALT 29 03/27/2023 0811   BILITOT 0.6 03/27/2023 0811       RADIOGRAPHIC STUDIES:  VAS [place] LOWER EXTREMITY VENOUS REFLUX Result Date: 04/12/2023  Lower Venous Reflux Study Patient Name:  EIMAN EHRGOTT  Date of Exam:   04/12/2023 Medical Rec #: [id number]        Accession #:    [id number] Date of Birth: [birth year]        Patient Gender: F Patient Age:   57 years Exam Location:  [facility] Procedure:      VAS [place] LOWER EXTREMITY VENOUS REFLUX Referring Phys: EMMA COLLINS --------------------------------------------------------------------------------  Indications: Lymphedema, cellulitis.  Performing Technologist: Thereasa Parkin RVT  Examination Guidelines: A complete evaluation includes B-mode imaging, spectral Doppler, color Doppler, and power Doppler as needed of all accessible portions of each vessel. Bilateral testing is considered an integral part of a complete examination. Limited examinations for reoccurring indications may be performed as noted. The reflux portion of the exam is performed with the patient in reverse Trendelenburg. Significant venous reflux is defined as >500 ms in the superficial venous system, and >1 second in the deep venous system.  Venous Reflux Times +--------------+---------+------+-----------+------------+--------+  RIGHT         Reflux NoRefluxReflux TimeDiameter cmsComments                         Yes                                  +--------------+---------+------+-----------+------------+--------+ CFV                     yes   >1 second                      +--------------+---------+------+-----------+------------+--------+ FV prox       no                                             +--------------+---------+------+-----------+------------+--------+ FV mid        no                                             +--------------+---------+------+-----------+------------+--------+ FV dist       no                                             +--------------+---------+------+-----------+------------+--------+ Popliteal               yes   >1 second                      +--------------+---------+------+-----------+------------+--------+ GSV at SFJ              yes    >500 ms     0.681             +--------------+---------+------+-----------+------------+--------+ GSV prox thighno  0.5              +--------------+---------+------+-----------+------------+--------+ GSV mid thigh no                           0.609             +--------------+---------+------+-----------+------------+--------+ GSV dist thighno                           0.487             +--------------+---------+------+-----------+------------+--------+ GSV at knee             yes    >500 ms     0.438             +--------------+---------+------+-----------+------------+--------+ GSV prox calf no                           0.388             +--------------+---------+------+-----------+------------+--------+ SSV Pop Fossa no                           0.372             +--------------+---------+------+-----------+------------+--------+ SSV prox calf no                           0.364              +--------------+---------+------+-----------+------------+--------+   Summary: Right: - No evidence of deep vein thrombosis seen in the right lower extremity, from the common femoral through the popliteal veins. - No evidence of superficial venous thrombosis in the right lower extremity. - Deep vein reflux in the CFV and popliteal vein. - Superficial vein reflux in the SFJ and GSV at the knee.  *See table(s) above for measurements and observations. Electronically signed by Gerarda Fraction on 04/12/2023 at 4:43:48 PM.    Final      ASSESSMENT/PLAN:  This is a very pleasant 57 year old African-American female referred to clinic for what is felt to be stage IV non-small cell lung cancer, adenocarcinoma (T2a, N3, M1c). She presented with bilateral lung nodules, thoracic adenopathy and bilobar liver lesions. She was diagnosed in May 2024. Her PD-L1 expression 0%. She does not have any actionable mutations by Guardant360. Her foundation 1 molecular testing is negative.    The patient is currently undergoing systemic chemoimmunotherapy with carboplatin for AUC 5, Alimta 500 Mg/M2 and Keytruda 200 Mg IV every 3 weeks status post 9 cycles.  Starting from cycle #5 she is on maintenance treatment with Alimta and Keytruda every 3 weeks.  Labs were reviewed.  Her anemia is worsening and her hemoglobin is 8.2 today.  The patient denies any abnormal bleeding or bruising.  She does have a history of iron deficiency.  We will add on iron studies, folate, ferritin, and B12 today onto her lab work.  She was instructed to start taking an iron supplement and to ensure that she is taking her folic acid daily.  I may make a lab only visit before her next visit just to ensure she has not had worsening anemia.  I would consider her for a blood transfusion if her hemoglobin were less than 8.  I will add a sample of blood bank for her future lab draws.    I discussed  the dose of her chemotherapy with Dr. Arbutus Ped given her worsening  anemia.  Dr. Arbutus Ped would like to keep her Alimta on the same dose since he is concerned about future disease progression.  4 we will continue her on the same dose with close monitoring.  I did review signs and symptoms of worsening anemia with the patient she is advised to call us if she needed a same-day lab appointment.  Recommend she continue on the same treatment at the same dose.  She will proceed with cycle #9 today of Alimta and Keytruda as scheduled.   We will see her back for labs and follow up in 3 weeks before undergoing cycle #10.   She will see her eye doctor later today for her vision changes.  She will continue using lotion on her lower extremities.  She seemed a little down today. Will have infusion RN check with her to see if she would be interested in seeing spiritual support.   The patient was advised to call immediately if he has any concerning symptoms in the interval. The patient voices understanding of current disease status and treatment options and is in agreement with the current care plan. All questions were answered. The patient knows to call the clinic with any problems, questions or concerns. We can certainly see the patient much sooner if necessary  Orders Placed This Encounter  Procedures   Iron and Iron Binding Capacity ([facility] only)    Standing Status:   Future    Expected Date:   04/17/2023    Expiration Date:   04/16/2024   Ferritin    Standing Status:   Future    Expected Date:   04/17/2023    Expiration Date:   04/16/2024   Vitamin B12    Standing Status:   Future    Expected Date:   04/17/2023    Expiration Date:   04/16/2024   Folate, Serum    Standing Status:   Future    Expected Date:   04/17/2023    Expiration Date:   04/16/2024   CBC with Differential (Cancer Center Only)    Standing Status:   Future    Expected Date:   05/29/2023    Expiration Date:   05/28/2024   CMP (Cancer Center only)    Standing Status:   Future    Expected  Date:   05/29/2023    Expiration Date:   05/28/2024   T4    Standing Status:   Future    Expected Date:   05/29/2023    Expiration Date:   05/28/2024   TSH    Standing Status:   Future    Expected Date:   05/29/2023    Expiration Date:   05/28/2024   CBC with Differential (Cancer Center Only)    Standing Status:   Future    Expected Date:   06/19/2023    Expiration Date:   06/18/2024   CMP (Cancer Center only)    Standing Status:   Future    Expected Date:   06/19/2023    Expiration Date:   06/18/2024   T4    Standing Status:   Future    Expected Date:   06/19/2023    Expiration Date:   06/18/2024   TSH    Standing Status:   Future    Expected Date:   06/19/2023    Expiration Date:   06/18/2024   Sample to Blood Bank    Standing Status:  Future    Expected Date:   04/17/2023    Expiration Date:   04/16/2024     The total time spent in the appointment was 30-39 minutes  Henryetta Corriveau L Kirklin Mcduffee, PA-C 04/17/23

## 2023-04-12 NOTE — Progress Notes (Signed)
VASCULAR & VEIN SPECIALISTS           OF [place]  History and Physical   Madison Palmer is a 57 y.o. female who presents with right leg discoloration and swelling.   She states that back in September, she developed pain in the right leg.  She was sent for u/s and this was negative for DVT.  She states that since then, the pain has improved.  She states she has discoloration on the right lower leg.  She states that she did have cellulitis and was placed on 3 different abx and it finally cleared.   She has been told she has lymphedema.  She has never had a DVT in the past.  She has worn compression socks in the past but not lately bc they are painful.  She does have another garment that she can wear that is not as tight and gives her a little compression.  She does have pumps at home but has not used them lately.  She does not know her family hx as she is adopted.  She has lost about 50 lbs since starting chemo as her tastes have changed. She has never had any interventions on her legs.  She does not get rest pain in her feet or have non healing wounds.  She does not get claudication.   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    The pt is on a statin for cholesterol management.  The pt is not on a daily aspirin.   Other AC:  none The pt is on CCB, clonidine, BB, ACEI for hypertension.   The pt is not on medication for diabetes.   Tobacco hx:  never   Past Medical History:  Diagnosis Date   Acute on chronic respiratory failure with hypoxia and hypercapnia (HCC) 09/15/2018   Anemia    Anxiety    Arrhythmia    tachycardia   Arthritis    Bipolar 1 disorder (HCC) 09/12/2018   Bipolar I disorder, current or most recent episode manic, with psychotic features (HCC)    Brief psychotic disorder (HCC) 08/22/2018   Chronic respiratory failure with hypoxia (HCC) 05/10/2018    Formatting of this note might be different from the original. Last Assessment & Plan:  Continue 1 L continuous on exertion and during sleep. On her next visit we will check OSA on CPAP/room air to decide whether she needs to take oxygen along with her on her cruise in May   Depression    Diverticulitis    Generalized edema 03/12/2018   GERD (gastroesophageal reflux disease)    Glaucoma    Hyperlipidemia    Hyperparathyroidism (HCC)    Hypertension    Inflammatory polyps of colon (HCC)    Leg swelling 01/14/2019   LVH (left ventricular hypertrophy)    Lymphedema    Morbid (severe) obesity due to excess calories (HCC) 05/11/2017   PCOS (polycystic ovarian syndrome)    Prediabetes    Recurrent UTI    Sleep apnea    CPAP   Thyroid disease    Vitamin D deficiency     Past Surgical History:  Procedure Laterality Date   BIOPSY  09/27/2022   Procedure: BIOPSY;  Surgeon: Lemar Lofty., MD;  Location[facility] ENDOSCOPY;  Service: Gastroenterology;;   BREAST BIOPSY  2015   CESAREAN SECTION  2004  CHOLECYSTECTOMY     COLONOSCOPY WITH PROPOFOL N/A 09/27/2022   Procedure: COLONOSCOPY WITH PROPOFOL;  Surgeon: Meridee Score Netty Starring., MD;  Location: [facility] ENDOSCOPY;  Service: Gastroenterology;  Laterality: N/A;   ESOPHAGOGASTRODUODENOSCOPY (EGD) WITH PROPOFOL N/A 09/27/2022   Procedure: ESOPHAGOGASTRODUODENOSCOPY (EGD) WITH PROPOFOL;  Surgeon: Meridee Score Netty Starring., MD;  Location: [facility] ENDOSCOPY;  Service: Gastroenterology;  Laterality: N/A;   INNER EAR SURGERY     ear and sinus surgery   LAPAROSCOPIC REPAIR AND REMOVAL OF GASTRIC BAND     MALONEY DILATION  09/27/2022   Procedure: MALONEY DILATION;  Surgeon: Lemar Lofty., MD;  Location: [facility] ENDOSCOPY;  Service: Gastroenterology;;   OOPHORECTOMY Left    POLYPECTOMY  09/27/2022   Procedure: POLYPECTOMY;  Surgeon: Lemar Lofty., MD;  Location[facility] ENDOSCOPY;  Service: Gastroenterology;;   TONSILLECTOMY AND ADENOIDECTOMY       Social History   Socioeconomic History   Marital status: Married    Spouse name: Not on file   Number of children: 1   Years of education: Not on file   Highest education level: Not on file  Occupational History   Occupation: Unemployed  Tobacco Use   Smoking status: Never   Smokeless tobacco: Never  Vaping Use   Vaping status: Never Used  Substance and Sexual Activity   Alcohol use: Yes    Comment: social   Drug use: No   Sexual activity: Not Currently  Other Topics Concern   Not on file  Social History Narrative   Lives with son    Currently separated from spouse   Left handed   Caffeine: coffee daily: half-caff 3 cups/day and decaf maybe 3-5 cups/day, occasional mtn dew, tea 1-2 times per week   Social Drivers of Health   Financial Resource Strain: Low Risk  (07/03/2022)   Overall Financial Resource Strain (CARDIA)    Difficulty of Paying Living Expenses: Not hard at all  Food Insecurity: No Food Insecurity (07/03/2022)   Hunger Vital Sign    Worried About Running Out of Food in the Last Year: Never true    Ran Out of Food in the Last Year: Never true  Transportation Needs: No Transportation Needs (07/03/2022)   PRAPARE - Administrator, Civil Service (Medical): No    Lack of Transportation (Non-Medical): No  Physical Activity: Inactive (07/03/2022)   Exercise Vital Sign    Days of Exercise per Week: 0 days    Minutes of Exercise per Session: 0 min  Stress: No Stress Concern Present (07/03/2022)   [employer] of Occupational Health - Occupational Stress Questionnaire    Feeling of Stress : Not at all  Social Connections: Unknown (11/11/2018)   Received from [facility], [facility], [facility]   Social Connection and Isolation Panel [NHANES]    Frequency of Communication with Friends and Family: Patient declined    Frequency of Social Gatherings with Friends and Family: Patient declined    Attends Religious Services: Patient  declined    Database administrator or Organizations: Patient declined    Attends Banker Meetings: Patient declined    Marital Status: Patient declined  Intimate Partner Violence: Unknown (08/02/2021)   Received from [employer], [facility]   HITS    Physically Hurt: Not on file    Insult or Talk Down To: Not on file    Threaten Physical Harm: Not on file    Scream or Curse: Not on file   Family hx  unknown as she is adopted.   Family History  Adopted: Yes  Problem Relation Age of Onset   Hearing loss Son        right    Healthy Son    Breast cancer Neg Hx     Current Outpatient Medications  Medication Sig Dispense Refill   acetaminophen (TYLENOL) 500 MG tablet Take 1,000 mg by mouth 3 (three) times daily as needed for moderate pain or headache.     amLODipine (NORVASC) 10 MG tablet TAKE 1 TABLET BY MOUTH EVERY DAY 90 tablet 3   atorvastatin (LIPITOR) 20 MG tablet TAKE 1 TABLET(20 MG) BY MOUTH 3 TIMES A WEEK 12 tablet 5   benztropine (COGENTIN) 0.5 MG tablet Take 1 tablet (0.5 mg total) by mouth at bedtime. 30 tablet 2   cetirizine (ZYRTEC) 10 MG tablet TAKE 1 TABLET BY MOUTH EVERY DAY 90 tablet 1   Cholecalciferol (VITAMIN D3 MAXIMUM STRENGTH) 125 MCG (5000 UT) capsule Take 5,000 Units by mouth daily.     cinacalcet (SENSIPAR) 30 MG tablet Take 30 mg by mouth daily.     cloNIDine (CATAPRES) 0.1 MG tablet TAKE 1 TABLET(0.1 MG) BY MOUTH THREE TIMES DAILY 90 tablet 3   esomeprazole (NEXIUM) 40 MG capsule TAKE 1 CAPSULE BY MOUTH TWICE DAILY 180 capsule 2   fenofibrate (TRICOR) 145 MG tablet TAKE 1 TABLET(145 MG) BY MOUTH DAILY 90 tablet 3   folic acid (FOLVITE) 1 MG tablet TAKE 1 TABLET BY MOUTH DAILY 30 tablet 2   furosemide (LASIX) 40 MG tablet TAKE 1 TABLET BY MOUTH EVERY DAY 90 tablet 3   gabapentin (NEURONTIN) 100 MG capsule TAKE 2 CAPSULES(200 MG) BY MOUTH AT BEDTIME 180 capsule 3   labetalol (NORMODYNE) 300 MG tablet TAKE 1 AND 1/2 TABLETS BY MOUTH TWICE DAILY  270 tablet 0   levothyroxine (SYNTHROID) 88 MCG tablet Take 88 mcg by mouth daily.     lisinopril (ZESTRIL) 40 MG tablet TAKE 1 TABLET BY MOUTH EVERY DAY 90 tablet 1   lurasidone (LATUDA) 40 MG TABS tablet Take 1 tablet (40 mg total) by mouth daily with breakfast. 30 tablet 2   Polyethylene Glycol 3350 (MIRALAX PO) Take 1 Capful by mouth daily as needed (Constipation).     Potassium Chloride ER 20 MEQ TBCR TAKE 1 TABLET BY MOUTH EVERY DAY 90 tablet 2   prochlorperazine (COMPAZINE) 10 MG tablet Take 1 tablet (10 mg total) by mouth every 6 (six) hours as needed. 30 tablet 2   TART CHERRY PO Take 1 tablet by mouth daily.     No current facility-administered medications for this visit.    Allergies  Allergen Reactions   Other Other (See Comments)    Hydralazine Hcl Other reaction(s): Other (See Comments) Instructed not to take by Cardiology.   Fentanyl Hives    Other reaction(s): hives had fentanyl recently with benadryl, did OK   Levofloxacin Hives   Midazolam Hives   Pollen Extract     seasonal   Atorvastatin Other (See Comments)    Muscle pain in legs  Abdominal pain   Hydralazine Hcl Other (See Comments)    Hypercalcemia    Rosuvastatin Other (See Comments)    Abdominal pain Other reaction(s): diarrhea    REVIEW OF SYSTEMS:   [X]  denotes positive finding, [ ]  denotes negative finding Cardiac  Comments:  Chest pain or chest pressure:    Shortness of breath upon exertion: x   Short of breath when lying flat:  Irregular heart rhythm:        Vascular    Pain in calf, thigh, or hip brought on by ambulation: x   Pain in feet at night that wakes you up from your sleep:     Blood clot in your veins:    Leg swelling:  x       Pulmonary    Oxygen at home:    Productive cough:     Wheezing:         Neurologic    Sudden weakness in arms or legs:     Sudden numbness in arms or legs:     Sudden onset of difficulty speaking or slurred speech:    Temporary loss of vision  in one eye:     Problems with dizziness:         Gastrointestinal    Blood in stool:     Vomited blood:         Genitourinary    Burning when urinating:     Blood in urine:        Psychiatric    Major depression:         Hematologic    Bleeding problems:    Problems with blood clotting too easily:        Skin    Rashes or ulcers: x       Constitutional    Fever or chills:      PHYSICAL EXAMINATION:  Today's Vitals   04/12/23 1053  BP: 117/63  Pulse: 71  Resp: 20  Temp: 98 F (36.7 C)  TempSrc: Temporal  SpO2: 92%  Weight: 282 lb (127.9 kg)  Height: 5\' 2"  (1.575 m)   Body mass index is 51.58 kg/m.   General:  WDWN in NAD; vital signs documented above Gait: Not observed HENT: WNL, normocephalic Pulmonary: normal non-labored breathing without wheezing Cardiac: regular HR; without carotid bruits Abdomen: soft, NT, aortic pulse is not palpable Skin: without rashes Vascular Exam/Pulses:  Right Left  Radial 2+ (normal) 2+ (normal)  DP 2+ (normal) 2+ (normal)  Extremities: + discoloration RLE below the knee.  -stemmer sign right; +stemmer sign left  Neurologic: A&O X 3;  moving all extremities equally Psychiatric:  The pt has Normal affect.   Non-Invasive Vascular Imaging:   Venous duplex on 04/12/2023: Venous Reflux Times  +--------------+---------+------+-----------+------------+--------+  RIGHT        Reflux NoRefluxReflux TimeDiameter cmsComments                          Yes                                   +--------------+---------+------+-----------+------------+--------+  CFV                    yes   >1 second                       +--------------+---------+------+-----------+------------+--------+  FV prox       no                                              +--------------+---------+------+-----------+------------+--------+  FV mid        no                                               +--------------+---------+------+-----------+------------+--------+  FV dist       no                                              +--------------+---------+------+-----------+------------+--------+  Popliteal              yes   >1 second                       +--------------+---------+------+-----------+------------+--------+  GSV at SFJ              yes    >500 ms     0.681              +--------------+---------+------+-----------+------------+--------+  GSV prox thighno                            0.5               +--------------+---------+------+-----------+------------+--------+  GSV mid thigh no                           0.609              +--------------+---------+------+-----------+------------+--------+  GSV dist thighno                           0.487              +--------------+---------+------+-----------+------------+--------+  GSV at knee             yes    >500 ms     0.438              +--------------+---------+------+-----------+------------+--------+  GSV prox calf no                           0.388              +--------------+---------+------+-----------+------------+--------+  SSV Pop Fossa no                           0.372              +--------------+---------+------+-----------+------------+--------+  SSV prox calf no                           0.364              +--------------+---------+------+-----------+------------+--------+   Summary:  Right:  - No evidence of deep vein thrombosis seen in the right lower extremity,  from the common femoral through the popliteal veins.  - No evidence of superficial venous thrombosis in the right lower extremity.  - Deep vein reflux in the CFV and popliteal vein.  - Superficial vein reflux in the SFJ and GSV at the knee.     Madison Palmer is a 57 y.o. female who presents hx of stage IV NSCLC with metastisis to liver with: RLE swelling, pain and  discoloration    -pt has easily palpable pedal pulses -pt does not have evidence of DVT.  Pt does have venous reflux in the right deep venous system as well as GSV at the SFJ and GSV at the knee.  She is not a candidate for laser  ablation -discussed with pt about wearing her compression socks but they are painful.  She does have pumps at home and we discussed using them again to see if she would get some benefit.   -discussed the importance of leg elevation and how to elevate properly - pt is advised to elevate their legs and a diagram is given to them to demonstrate for pt to lay flat on their back with knees elevated and slightly bent with their feet higher than their knees, which puts their feet higher than their heart for 15 minutes per day.  If pt cannot lay flat, advised to lay as flat as possible.  -pt is advised to continue as much walking as possible and avoid sitting or standing for long periods of time.  -discussed importance of weight loss and exercise and that water aerobics would also be beneficial.  -handout with recommendations given -pt will f/u as needed   Doreatha Massed, [facility] Vein Specialists [phone]  Clinic MD:  Karin Lieu

## 2023-04-17 ENCOUNTER — Other Ambulatory Visit: Payer: Self-pay | Admitting: Nurse Practitioner

## 2023-04-17 ENCOUNTER — Other Ambulatory Visit: Payer: Self-pay | Admitting: Pharmacist

## 2023-04-17 ENCOUNTER — Encounter: Payer: Self-pay | Admitting: Internal Medicine

## 2023-04-17 ENCOUNTER — Other Ambulatory Visit: Payer: Self-pay

## 2023-04-17 ENCOUNTER — Inpatient Hospital Stay: Payer: BC Managed Care – PPO | Attending: Physician Assistant

## 2023-04-17 ENCOUNTER — Inpatient Hospital Stay (HOSPITAL_BASED_OUTPATIENT_CLINIC_OR_DEPARTMENT_OTHER): Payer: BC Managed Care – PPO | Admitting: Physician Assistant

## 2023-04-17 ENCOUNTER — Inpatient Hospital Stay: Payer: BC Managed Care – PPO

## 2023-04-17 VITALS — BP 116/83 | HR 76 | Temp 97.7°F | Resp 20 | Wt 286.0 lb

## 2023-04-17 DIAGNOSIS — H538 Other visual disturbances: Secondary | ICD-10-CM | POA: Insufficient documentation

## 2023-04-17 DIAGNOSIS — Z79620 Long term (current) use of immunosuppressive biologic: Secondary | ICD-10-CM | POA: Diagnosis not present

## 2023-04-17 DIAGNOSIS — R059 Cough, unspecified: Secondary | ICD-10-CM | POA: Insufficient documentation

## 2023-04-17 DIAGNOSIS — C787 Secondary malignant neoplasm of liver and intrahepatic bile duct: Secondary | ICD-10-CM | POA: Diagnosis not present

## 2023-04-17 DIAGNOSIS — D649 Anemia, unspecified: Secondary | ICD-10-CM | POA: Diagnosis not present

## 2023-04-17 DIAGNOSIS — Z5111 Encounter for antineoplastic chemotherapy: Secondary | ICD-10-CM

## 2023-04-17 DIAGNOSIS — C3492 Malignant neoplasm of unspecified part of left bronchus or lung: Secondary | ICD-10-CM

## 2023-04-17 DIAGNOSIS — E782 Mixed hyperlipidemia: Secondary | ICD-10-CM

## 2023-04-17 DIAGNOSIS — Z5112 Encounter for antineoplastic immunotherapy: Secondary | ICD-10-CM | POA: Insufficient documentation

## 2023-04-17 DIAGNOSIS — C3490 Malignant neoplasm of unspecified part of unspecified bronchus or lung: Secondary | ICD-10-CM | POA: Insufficient documentation

## 2023-04-17 LAB — CBC WITH DIFFERENTIAL (CANCER CENTER ONLY)
Abs Immature Granulocytes: 0.07 10*3/uL (ref 0.00–0.07)
Basophils Absolute: 0 10*3/uL (ref 0.0–0.1)
Basophils Relative: 0 %
Eosinophils Absolute: 0 10*3/uL (ref 0.0–0.5)
Eosinophils Relative: 1 %
HCT: 25.1 % — ABNORMAL LOW (ref 36.0–46.0)
Hemoglobin: 8.2 g/dL — ABNORMAL LOW (ref 12.0–15.0)
Immature Granulocytes: 2 %
Lymphocytes Relative: 22 %
Lymphs Abs: 0.9 10*3/uL (ref 0.7–4.0)
MCH: 36 pg — ABNORMAL HIGH (ref 26.0–34.0)
MCHC: 32.7 g/dL (ref 30.0–36.0)
MCV: 110.1 fL — ABNORMAL HIGH (ref 80.0–100.0)
Monocytes Absolute: 0.4 10*3/uL (ref 0.1–1.0)
Monocytes Relative: 11 %
Neutro Abs: 2.6 10*3/uL (ref 1.7–7.7)
Neutrophils Relative %: 64 %
Platelet Count: 281 10*3/uL (ref 150–400)
RBC: 2.28 MIL/uL — ABNORMAL LOW (ref 3.87–5.11)
RDW: 16.6 % — ABNORMAL HIGH (ref 11.5–15.5)
WBC Count: 4 10*3/uL (ref 4.0–10.5)
nRBC: 0.5 % — ABNORMAL HIGH (ref 0.0–0.2)

## 2023-04-17 LAB — CMP (CANCER CENTER ONLY)
ALT: 52 U/L — ABNORMAL HIGH (ref 0–44)
AST: 44 U/L — ABNORMAL HIGH (ref 15–41)
Albumin: 3.6 g/dL (ref 3.5–5.0)
Alkaline Phosphatase: 62 U/L (ref 38–126)
Anion gap: 5 (ref 5–15)
BUN: 13 mg/dL (ref 6–20)
CO2: 34 mmol/L — ABNORMAL HIGH (ref 22–32)
Calcium: 9.7 mg/dL (ref 8.9–10.3)
Chloride: 105 mmol/L (ref 98–111)
Creatinine: 1.25 mg/dL — ABNORMAL HIGH (ref 0.44–1.00)
GFR, Estimated: 50 mL/min — ABNORMAL LOW (ref >60)
Glucose, Bld: 115 mg/dL — ABNORMAL HIGH (ref 70–99)
Potassium: 3.5 mmol/L (ref 3.5–5.1)
Sodium: 144 mmol/L (ref 135–145)
Total Bilirubin: 0.6 mg/dL (ref <1.2)
Total Protein: 6.5 g/dL (ref 6.5–8.1)

## 2023-04-17 LAB — IRON AND IRON BINDING CAPACITY (CC-WL,HP ONLY)
Iron: 113 ug/dL (ref 28–170)
Saturation Ratios: 30 % (ref 10.4–31.8)
TIBC: 384 ug/dL (ref 250–450)
UIBC: 271 ug/dL (ref 148–442)

## 2023-04-17 LAB — VITAMIN B12: Vitamin B-12: 542 pg/mL (ref 180–914)

## 2023-04-17 LAB — FERRITIN: Ferritin: 325 ng/mL — ABNORMAL HIGH (ref 11–307)

## 2023-04-17 LAB — FOLATE: Folate: 7.2 ng/mL (ref >5.9)

## 2023-04-17 LAB — SAMPLE TO BLOOD BANK

## 2023-04-17 LAB — ABO/RH: ABO/RH(D): O POS

## 2023-04-17 LAB — TSH: TSH: 2.76 u[IU]/mL (ref 0.350–4.500)

## 2023-04-17 MED ORDER — PEMBROLIZUMAB CHEMO INJECTION 100 MG/4ML
200.0000 mg | Freq: Once | INTRAVENOUS | Status: AC
  Administered 2023-04-17: 200 mg via INTRAVENOUS
  Filled 2023-04-17: qty 200

## 2023-04-17 MED ORDER — PROCHLORPERAZINE MALEATE 10 MG PO TABS
10.0000 mg | ORAL_TABLET | Freq: Once | ORAL | Status: AC
  Administered 2023-04-17: 10 mg via ORAL
  Filled 2023-04-17: qty 1

## 2023-04-17 MED ORDER — CYANOCOBALAMIN 1000 MCG/ML IJ SOLN
1000.0000 ug | Freq: Once | INTRAMUSCULAR | Status: DC
Start: 2023-04-17 — End: 2023-04-17

## 2023-04-17 MED ORDER — SODIUM CHLORIDE 0.9 % IV SOLN
500.0000 mg/m2 | Freq: Once | INTRAVENOUS | Status: AC
  Administered 2023-04-17: 1200 mg via INTRAVENOUS
  Filled 2023-04-17: qty 40

## 2023-04-17 MED ORDER — SODIUM CHLORIDE 0.9 % IV SOLN
Freq: Once | INTRAVENOUS | Status: AC
Start: 2023-04-17 — End: 2023-04-17

## 2023-04-17 NOTE — Patient Instructions (Signed)
[facility] - A DEPT OF [facility]  Discharge Instructions: Thank you for choosing [facility] to provide your oncology and hematology care.   If you have a lab appointment with the Cancer Center, please go directly to the Cancer Center and check in at the registration area.   Wear comfortable clothing and clothing appropriate for easy access to any Portacath or PICC line.   We strive to give you quality time with your provider. You may need to reschedule your appointment if you arrive late (15 or more minutes).  Arriving late affects you and other patients whose appointments are after yours.  Also, if you miss three or more appointments without notifying the office, you may be dismissed from the clinic at the provider's discretion.      For prescription refill requests, have your pharmacy contact our office and allow 72 hours for refills to be completed.    Today you received the following chemotherapy and/or immunotherapy agents keytruda, alimta      To help prevent nausea and vomiting after your treatment, we encourage you to take your nausea medication as directed.  BELOW ARE SYMPTOMS THAT SHOULD BE REPORTED IMMEDIATELY: *FEVER GREATER THAN 100.4 F (38 C) OR HIGHER *CHILLS OR SWEATING *NAUSEA AND VOMITING THAT IS NOT CONTROLLED WITH YOUR NAUSEA MEDICATION *UNUSUAL SHORTNESS OF BREATH *UNUSUAL BRUISING OR BLEEDING *URINARY PROBLEMS (pain or burning when urinating, or frequent urination) *BOWEL PROBLEMS (unusual diarrhea, constipation, pain near the anus) TENDERNESS IN MOUTH AND THROAT WITH OR WITHOUT PRESENCE OF ULCERS (sore throat, sores in mouth, or a toothache) UNUSUAL RASH, SWELLING OR PAIN  UNUSUAL VAGINAL DISCHARGE OR ITCHING   Items with * indicate a potential emergency and should be followed up as soon as possible or go to the Emergency Department if any problems should occur.  Please show the CHEMOTHERAPY ALERT CARD or  IMMUNOTHERAPY ALERT CARD at check-in to the Emergency Department and triage nurse.  Should you have questions after your visit or need to cancel or reschedule your appointment, please contact [facility] - [facility]  Dept: [phone]  and follow the prompts.  Office hours are 8:00 a.m. to 4:30 p.m. Monday - Friday. Please note that voicemails left after 4:00 p.m. may not be returned until the following business day.  We are closed weekends and major holidays. You have access to a nurse at all times for urgent questions. Please call the main number to the clinic Dept: [phone] and follow the prompts.   For any non-urgent questions, you may also contact your provider using MyChart. We now offer e-Visits for anyone 51 and older to request care online for non-urgent symptoms. For details visit [email or web address].   Also download the MyChart app! Go to the app store, search "MyChart", open the app, select [place], and log in with your MyChart username and password.

## 2023-04-18 LAB — T4: T4, Total: 11.9 ug/dL (ref 4.5–12.0)

## 2023-04-19 ENCOUNTER — Encounter: Payer: Self-pay | Admitting: General Practice

## 2023-04-19 NOTE — Progress Notes (Signed)
[facility] Spiritual Care Note  Referred by nursing for additional layer of emotional support per Madison Palmer's request. 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   [address] [place], [place], [facility] Pager [phone] Voicemail [phone]

## 2023-04-23 ENCOUNTER — Other Ambulatory Visit: Payer: Self-pay | Admitting: Physician Assistant

## 2023-04-23 ENCOUNTER — Inpatient Hospital Stay: Payer: BC Managed Care – PPO

## 2023-04-23 DIAGNOSIS — Z5112 Encounter for antineoplastic immunotherapy: Secondary | ICD-10-CM | POA: Diagnosis not present

## 2023-04-23 DIAGNOSIS — D6481 Anemia due to antineoplastic chemotherapy: Secondary | ICD-10-CM

## 2023-04-23 DIAGNOSIS — D649 Anemia, unspecified: Secondary | ICD-10-CM

## 2023-04-23 DIAGNOSIS — C3492 Malignant neoplasm of unspecified part of left bronchus or lung: Secondary | ICD-10-CM

## 2023-04-23 LAB — CBC WITH DIFFERENTIAL (CANCER CENTER ONLY)
Abs Immature Granulocytes: 0.01 10*3/uL (ref 0.00–0.07)
Basophils Absolute: 0 10*3/uL (ref 0.0–0.1)
Basophils Relative: 0 %
Eosinophils Absolute: 0 10*3/uL (ref 0.0–0.5)
Eosinophils Relative: 1 %
HCT: 23.6 % — ABNORMAL LOW (ref 36.0–46.0)
Hemoglobin: 7.8 g/dL — ABNORMAL LOW (ref 12.0–15.0)
Immature Granulocytes: 1 %
Lymphocytes Relative: 35 %
Lymphs Abs: 0.5 10*3/uL — ABNORMAL LOW (ref 0.7–4.0)
MCH: 36.3 pg — ABNORMAL HIGH (ref 26.0–34.0)
MCHC: 33.1 g/dL (ref 30.0–36.0)
MCV: 109.8 fL — ABNORMAL HIGH (ref 80.0–100.0)
Monocytes Absolute: 0 10*3/uL — ABNORMAL LOW (ref 0.1–1.0)
Monocytes Relative: 1 %
Neutro Abs: 1 10*3/uL — ABNORMAL LOW (ref 1.7–7.7)
Neutrophils Relative %: 62 %
Platelet Count: 214 10*3/uL (ref 150–400)
RBC: 2.15 MIL/uL — ABNORMAL LOW (ref 3.87–5.11)
RDW: 16.1 % — ABNORMAL HIGH (ref 11.5–15.5)
WBC Count: 1.6 10*3/uL — ABNORMAL LOW (ref 4.0–10.5)
nRBC: 0 % (ref 0.0–0.2)

## 2023-04-23 LAB — SAMPLE TO BLOOD BANK

## 2023-04-23 LAB — CMP (CANCER CENTER ONLY)
ALT: 38 U/L (ref 0–44)
AST: 36 U/L (ref 15–41)
Albumin: 3.4 g/dL — ABNORMAL LOW (ref 3.5–5.0)
Alkaline Phosphatase: 64 U/L (ref 38–126)
Anion gap: 6 (ref 5–15)
BUN: 18 mg/dL (ref 6–20)
CO2: 34 mmol/L — ABNORMAL HIGH (ref 22–32)
Calcium: 9.8 mg/dL (ref 8.9–10.3)
Chloride: 104 mmol/L (ref 98–111)
Creatinine: 1.27 mg/dL — ABNORMAL HIGH (ref 0.44–1.00)
GFR, Estimated: 49 mL/min — ABNORMAL LOW (ref >60)
Glucose, Bld: 136 mg/dL — ABNORMAL HIGH (ref 70–99)
Potassium: 3.9 mmol/L (ref 3.5–5.1)
Sodium: 144 mmol/L (ref 135–145)
Total Bilirubin: 0.7 mg/dL (ref <1.2)
Total Protein: 6.4 g/dL — ABNORMAL LOW (ref 6.5–8.1)

## 2023-04-23 LAB — PREPARE RBC (CROSSMATCH)

## 2023-04-24 ENCOUNTER — Inpatient Hospital Stay: Payer: BC Managed Care – PPO

## 2023-04-24 DIAGNOSIS — Z5112 Encounter for antineoplastic immunotherapy: Secondary | ICD-10-CM | POA: Diagnosis not present

## 2023-04-24 DIAGNOSIS — D6481 Anemia due to antineoplastic chemotherapy: Secondary | ICD-10-CM

## 2023-04-24 MED ORDER — SODIUM CHLORIDE 0.9% IV SOLUTION
250.0000 mL | INTRAVENOUS | Status: DC
  Administered 2023-04-24: 100 mL via INTRAVENOUS

## 2023-04-24 MED ORDER — ACETAMINOPHEN 325 MG PO TABS
650.0000 mg | ORAL_TABLET | Freq: Once | ORAL | Status: AC
  Administered 2023-04-24: 650 mg via ORAL
  Filled 2023-04-24: qty 2

## 2023-04-24 MED ORDER — DIPHENHYDRAMINE HCL 25 MG PO CAPS
25.0000 mg | ORAL_CAPSULE | Freq: Once | ORAL | Status: AC
  Administered 2023-04-24: 25 mg via ORAL
  Filled 2023-04-24: qty 1

## 2023-04-24 NOTE — Patient Instructions (Signed)

## 2023-04-25 LAB — BPAM RBC
Blood Product Expiration Date: 202501222359
ISSUE DATE / TIME: 202412240838
Unit Type and Rh: 202501222359
Unit Type and Rh: 5100

## 2023-04-25 LAB — TYPE AND SCREEN
ABO/RH(D): O POS
Antibody Screen: NEGATIVE
Unit division: 0

## 2023-04-27 ENCOUNTER — Other Ambulatory Visit: Payer: Self-pay | Admitting: Cardiovascular Disease

## 2023-04-30 ENCOUNTER — Encounter: Payer: Self-pay | Admitting: General Practice

## 2023-04-30 NOTE — Progress Notes (Signed)
[facility] Spiritual Care Note  Left follow-up voicemail with information about holiday availability and encouragement to return call.   [address] [place], [place], [facility] Pager [phone] Voicemail [phone]

## 2023-05-04 NOTE — Progress Notes (Signed)
 [facility] OFFICE PROGRESS NOTE  Madison Palmer, Madison [place], NP [address] [place] [address]  DIAGNOSIS: Stage IV non-small cell lung cancer, adenocarcinoma (T2a, N3, M1c).  She presented with bilateral lung nodules, thoracic adenopathy and bilobar liver lesions.  She was diagnosed in May 2024.     PDL1: 0   Guardant 360: Negative for any actionable mutations   Foundation One: No actionable mutations   PRIOR THERAPY: None  CURRENT THERAPY: Palliative systemic chemotherapy and immunotherapy with carboplatin  for an AUC of 5, alimta  500 mg/m2, and Keytruda  200 mg IV every 3 weeks. First dose on 10/10/22. Status post 10 cycles. Starting from cycle #5 the patient is on maintenance treatment with Alimta  and Keytruda  every 3 weeks. Her dose of Alimta  was held today (1/8) due to elevated creatinine. We may reduce her dose of Alimta  to 400 mg/m2 in the future due to anemia.     INTERVAL HISTORY: Madison Palmer 58 y.o. female returns to the clinic today for a follow up visit accompanied by her son.  The patient was last seen 3 weeks ago by myself.  She is currently on maintenance immunotherapy and chemotherapy. She does experience fatigue with treatment. For the last few weeks, she has been having increased dizziness/lightheadedness, especially in the mornings. She denies any other neurological symptoms.   She did start showing worsening anemia at her last appointment. She did receive a blood transfusion in the interval with 1 unit around Christmas. She also was encouraged at her last appointment to be compliant with her folic acid  which she did start back taking. She is not taking any blood thinners. Her last colonoscopy was in May and she states there was no concerns. She does not take any NSAIDs at this time and takes Tylenol  if needed. She denies any abnormal bleeding or bruising or dark stools.   She denies any fever or night sweats.  She denies any significant shortness  of breath. She denies significant cough. She denies any nausea, vomiting, or diarrhea. Denies constipation. She denies headache. She just saw her eye doctor after her last appointment due to increased blurry vision. Her eye exam was reportedly normal. She continues to have hyperpigmentation and dry skin on her lower extremities, likely due to her chemotherapy with Alimta . She has not been applying lotion. She is here for evaluation and repeat blood work before undergoing cycle #11.    MEDICAL HISTORY: Past Medical History:  Diagnosis Date   Acute on chronic respiratory failure with hypoxia and hypercapnia (HCC) 09/15/2018   Anemia    Anxiety    Arrhythmia    tachycardia   Arthritis    Bipolar 1 disorder (HCC) 09/12/2018   Bipolar I disorder, current or most recent episode manic, with psychotic features (HCC)    Brief psychotic disorder (HCC) 08/22/2018   Chronic respiratory failure with hypoxia (HCC) 05/10/2018   Formatting of this note might be different from the original. Last Assessment & Plan:  Continue 1 L continuous on exertion and during sleep. On her next visit we will check OSA on CPAP/room air to decide whether she needs to take oxygen along with her on her cruise in May   Depression    Diverticulitis    Generalized edema 03/12/2018   GERD (gastroesophageal reflux disease)    Glaucoma    Hyperlipidemia    Hyperparathyroidism (HCC)    Hypertension    Inflammatory polyps of colon (HCC)    Leg swelling 01/14/2019  LVH (left ventricular hypertrophy)    Lymphedema    Morbid (severe) obesity due to excess calories (HCC) 05/11/2017   PCOS (polycystic ovarian syndrome)    Peripheral vascular disease (HCC)    Prediabetes    Recurrent UTI    Sleep apnea    CPAP   Thyroid  disease    Vitamin D  deficiency     ALLERGIES:  is allergic to other, fentanyl , levofloxacin, midazolam , pollen extract, atorvastatin , hydralazine  hcl, and rosuvastatin.  MEDICATIONS:  Current Outpatient  Medications  Medication Sig Dispense Refill   acetaminophen  (TYLENOL ) 500 MG tablet Take 1,000 mg by mouth 3 (three) times daily as needed for moderate pain or headache.     amLODipine  (NORVASC ) 10 MG tablet TAKE 1 TABLET BY MOUTH EVERY DAY 90 tablet 3   atorvastatin  (LIPITOR) 20 MG tablet TAKE 1 TABLET(20 MG) BY MOUTH 3 TIMES A WEEK 12 tablet 2   benztropine  (COGENTIN ) 0.5 MG tablet Take 1 tablet (0.5 mg total) by mouth at bedtime. 30 tablet 2   cetirizine  (ZYRTEC ) 10 MG tablet TAKE 1 TABLET BY MOUTH EVERY DAY 90 tablet 1   Cholecalciferol  (VITAMIN D3 MAXIMUM STRENGTH) 125 MCG (5000 UT) capsule Take 5,000 Units by mouth daily.     cinacalcet (SENSIPAR) 30 MG tablet Take 30 mg by mouth daily.     cloNIDine  (CATAPRES ) 0.1 MG tablet TAKE 1 TABLET(0.1 MG) BY MOUTH THREE TIMES DAILY 90 tablet 3   esomeprazole  (NEXIUM ) 40 MG capsule TAKE 1 CAPSULE BY MOUTH TWICE DAILY 180 capsule 2   fenofibrate  (TRICOR ) 145 MG tablet TAKE 1 TABLET(145 MG) BY MOUTH DAILY 90 tablet 3   folic acid  (FOLVITE ) 1 MG tablet TAKE 1 TABLET BY MOUTH DAILY 30 tablet 2   furosemide  (LASIX ) 40 MG tablet TAKE 1 TABLET BY MOUTH EVERY DAY 90 tablet 3   gabapentin  (NEURONTIN ) 100 MG capsule TAKE 2 CAPSULES(200 MG) BY MOUTH AT BEDTIME 180 capsule 3   labetalol  (NORMODYNE ) 300 MG tablet TAKE 1 AND 1/2 TABLETS BY MOUTH TWICE DAILY 270 tablet 0   levothyroxine  (SYNTHROID ) 88 MCG tablet Take 88 mcg by mouth daily.     lisinopril  (ZESTRIL ) 40 MG tablet TAKE 1 TABLET BY MOUTH EVERY DAY 90 tablet 1   lurasidone  (LATUDA ) 40 MG TABS tablet Take 1 tablet (40 mg total) by mouth daily with breakfast. 30 tablet 2   Polyethylene Glycol 3350  (MIRALAX  PO) Take 1 Capful by mouth daily as needed (Constipation).     Potassium Chloride  ER 20 MEQ TBCR TAKE 1 TABLET BY MOUTH EVERY DAY 90 tablet 2   prochlorperazine  (COMPAZINE ) 10 MG tablet Take 1 tablet (10 mg total) by mouth every 6 (six) hours as needed. 30 tablet 2   TART CHERRY PO Take 1 tablet by  mouth daily.     No current facility-administered medications for this visit.    SURGICAL HISTORY:  Past Surgical History:  Procedure Laterality Date   BIOPSY  09/27/2022   Procedure: BIOPSY;  Surgeon: Mansouraty, Aloha Raddle., MD;  Location: [facility] ENDOSCOPY;  Service: Gastroenterology;;   BREAST BIOPSY  2015   CESAREAN SECTION  2004   CHOLECYSTECTOMY     COLONOSCOPY WITH PROPOFOL  N/A 09/27/2022   Procedure: COLONOSCOPY WITH PROPOFOL ;  Surgeon: Wilhelmenia Aloha Raddle., MD;  Location: [facility] ENDOSCOPY;  Service: Gastroenterology;  Laterality: N/A;   ESOPHAGOGASTRODUODENOSCOPY (EGD) WITH PROPOFOL  N/A 09/27/2022   Procedure: ESOPHAGOGASTRODUODENOSCOPY (EGD) WITH PROPOFOL ;  Surgeon: Wilhelmenia Aloha Raddle., MD;  Location: [facility] ENDOSCOPY;  Service: Gastroenterology;  Laterality: N/A;  INNER EAR SURGERY     ear and sinus surgery   LAPAROSCOPIC REPAIR AND REMOVAL OF GASTRIC BAND     MALONEY DILATION  09/27/2022   Procedure: MALONEY DILATION;  Surgeon: Wilhelmenia Aloha Raddle., MD;  Location: [facility] ENDOSCOPY;  Service: Gastroenterology;;   OOPHORECTOMY Left    POLYPECTOMY  09/27/2022   Procedure: POLYPECTOMY;  Surgeon: Wilhelmenia Aloha Raddle., MD;  Location: [facility] ENDOSCOPY;  Service: Gastroenterology;;   TONSILLECTOMY AND ADENOIDECTOMY      REVIEW OF SYSTEMS:   Review of Systems  Constitutional: Positive for fatigue. Negative for appetite change, chills,  fever and unexpected weight change.  HENT: Negative for mouth sores, nosebleeds, sore throat and trouble swallowing.   Eyes: Negative for eye problems and icterus.  Respiratory: Negative for cough, hemoptysis, shortness of breath and wheezing.   Cardiovascular: Negative for chest pain and leg swelling.  Gastrointestinal: Negative for abdominal pain, constipation, diarrhea, nausea and vomiting.  Genitourinary: Negative for bladder incontinence, difficulty urinating, dysuria, frequency and hematuria.   Musculoskeletal: Negative for back pain, gait problem,  neck pain and neck stiffness.  Skin: Positive for dry skin and hyperpigmentation on her right lower extremity.   Neurological: Positive for lightheadedness. Negative for dizziness, extremity weakness, gait problem, headaches, and seizures.  Hematological: Negative for adenopathy. Does not bruise/bleed easily.  Psychiatric/Behavioral: Negative for confusion, depression and sleep disturbance. The patient is not nervous/anxious.     PHYSICAL EXAMINATION:  Last menstrual period 05/02/2017.  ECOG PERFORMANCE STATUS: 1  Physical Exam  Constitutional: Oriented to person, place, and time and well-developed, well-nourished, and in no distress.  HENT:  Head: Normocephalic and atraumatic.  Mouth/Throat: Oropharynx is clear and moist. No oropharyngeal exudate.  Eyes: Conjunctivae are normal. Right eye exhibits no discharge. Left eye exhibits no discharge. No scleral icterus.  Neck: Normal range of motion. Neck supple.  Cardiovascular: Normal rate, regular rhythm, normal heart sounds and intact distal pulses.   Pulmonary/Chest: Effort normal and breath sounds normal. No respiratory distress. No wheezes. No rales.  Abdominal: Soft. Bowel sounds are normal. Exhibits no distension and no mass. There is no tenderness.  Musculoskeletal: Normal range of motion. Exhibits no edema.  Lymphadenopathy:    No cervical adenopathy.  Neurological: Alert and oriented to person, place, and time. Exhibits normal muscle tone. Gait normal. Coordination normal.  Skin: Skin is warm and dry. Positive for hyperpigmentation and dry cracked skin on right lower extremity. No warmth, swelling, or erythema. Not diaphoretic.  No pallor.  Psychiatric: Mood, memory and judgment normal.  Vitals reviewed.  LABORATORY DATA: Lab Results  Component Value Date   WBC 1.6 (L) 04/23/2023   HGB 7.8 (L) 04/23/2023   HCT 23.6 (L) 04/23/2023   MCV 109.8 (H) 04/23/2023   PLT 214 04/23/2023      Chemistry      Component Value  Date/Time   NA 144 04/23/2023 1137   NA 145 09/08/2021 0000   K 3.9 04/23/2023 1137   CL 104 04/23/2023 1137   CO2 34 (H) 04/23/2023 1137   BUN 18 04/23/2023 1137   BUN 36 (H) 08/22/2021 1606   CREATININE 1.27 (H) 04/23/2023 1137   CREATININE 1.13 (H) 12/24/2018 1402   GLU 97 09/08/2021 0000      Component Value Date/Time   CALCIUM  9.8 04/23/2023 1137   ALKPHOS 64 04/23/2023 1137   AST 36 04/23/2023 1137   ALT 38 04/23/2023 1137   BILITOT 0.7 04/23/2023 1137       RADIOGRAPHIC STUDIES:  VAS  US  LOWER EXTREMITY VENOUS REFLUX Result Date: 04/12/2023  Lower Venous Reflux Study Patient Name:  ELISHEVA FALLAS  Date of Exam:   04/12/2023 Medical Rec #: [id number]        Accession #:    [id number] Date of Birth: [birth year]        Patient Gender: F Patient Age:   5 years Exam Location:  [facility] Procedure:      VAS US  LOWER EXTREMITY VENOUS REFLUX Referring Phys: EMMA COLLINS --------------------------------------------------------------------------------  Indications: Lymphedema, cellulitis.  Performing Technologist: King Pierre RVT  Examination Guidelines: A complete evaluation includes B-mode imaging, spectral Doppler, color Doppler, and power Doppler as needed of all accessible portions of each vessel. Bilateral testing is considered an integral part of a complete examination. Limited examinations for reoccurring indications may be performed as noted. The reflux portion of the exam is performed with the patient in reverse Trendelenburg. Significant venous reflux is defined as >500 ms in the superficial venous system, and >1 second in the deep venous system.  Venous Reflux Times +--------------+---------+------+-----------+------------+--------+ RIGHT         Reflux NoRefluxReflux TimeDiameter cmsComments                         Yes                                  +--------------+---------+------+-----------+------------+--------+ CFV                      yes   >1 second                      +--------------+---------+------+-----------+------------+--------+ FV prox       no                                             +--------------+---------+------+-----------+------------+--------+ FV mid        no                                             +--------------+---------+------+-----------+------------+--------+ FV dist       no                                             +--------------+---------+------+-----------+------------+--------+ Popliteal               yes   >1 second                      +--------------+---------+------+-----------+------------+--------+ GSV at SFJ              yes    >500 ms     0.681             +--------------+---------+------+-----------+------------+--------+ GSV prox thighno                            0.5              +--------------+---------+------+-----------+------------+--------+ GSV mid thigh no  0.609             +--------------+---------+------+-----------+------------+--------+ GSV dist thighno                           0.487             +--------------+---------+------+-----------+------------+--------+ GSV at knee             yes    >500 ms     0.438             +--------------+---------+------+-----------+------------+--------+ GSV prox calf no                           0.388             +--------------+---------+------+-----------+------------+--------+ SSV Pop Fossa no                           0.372             +--------------+---------+------+-----------+------------+--------+ SSV prox calf no                           0.364             +--------------+---------+------+-----------+------------+--------+   Summary: Right: - No evidence of deep vein thrombosis seen in the right lower extremity, from the common femoral through the popliteal veins. - No evidence of superficial venous thrombosis in the right  lower extremity. - Deep vein reflux in the CFV and popliteal vein. - Superficial vein reflux in the SFJ and GSV at the knee.  *See table(s) above for measurements and observations. Electronically signed by Fonda Rim on 04/12/2023 at 4:43:48 PM.    Final      ASSESSMENT/PLAN:  This is a very pleasant 58 year old African-American female referred to clinic for what is felt to be stage IV non-small cell lung cancer, adenocarcinoma (T2a, N3, M1c). She presented with bilateral lung nodules, thoracic adenopathy and bilobar liver lesions. She was diagnosed in May 2024. Her PD-L1 expression 0%. She does not have any actionable mutations by Guardant360. Her foundation 1 molecular testing is negative.    The patient is currently undergoing systemic chemoimmunotherapy with carboplatin  for AUC 5, Alimta  500 Mg/M2 and Keytruda  200 Mg IV every 3 weeks status post 10 cycles.  Starting from cycle #5 she is on maintenance treatment with Alimta  and Keytruda  every 3 weeks.  I reviewed her symptoms and labs with Dr. Sherrod with regard for decisions moving forward with her chemotherapy with Alimta .   Today, her creatinine is 1.65. Therefore, we will hold her alimta  today. Likely alimta  is responsible for her worsening anemia. However, it would be ideal to add alimta  back with future cycles of treatment due to concerns with possible future progression of disease. We will recheck her creatinine with her next cycle of treatment, may consider dose reduction of alimta  to 400 mg/m2 if creatinine allows.   She will proceed today with keytruda  only today (immunotherapy). She is ok to treat with 1.65 creatinine today.   She will also receive 1 unit of blood today and 1 unit of blood Friday. She received 1 unit of blood in the interval since being seen and continued to show symptomatic anemia today which is why she will receive a total of 2 units this week.   I will monitor her labs closely on a weekly basis and  arrange for a  blood transfusion if her Hbg is <8. I have added weekly sample to blood bank holds.   She is up to date on her colonoscopy. She denies visible bleeding. However, I will also arrange stool cards to ensure no GI blood loss.   For her slight bump in her creatinine today from her baseline, this could be due to her anemia. In the setting of the current IV fluid shortage, this patient is appropriate to increase her hydration with water  p.o. Will recheck creatinine next week. Can consider IVF if there continues to be concerns.   Her lightheadedness could be secondary to her anemia. However, I will arrange. For repeat brain MRI to rule out metastatic disease to the brain.   Her iron studies were normal when checked last month. She was encouraged to be compliant with her folic acid .   She was encouraged to apply lotion and cream on her dry skin and hyperpigmentation on her lower extremities. This is likely secondary to her alimta  (chemotherapy).   I will arrange for a restaging CT scan of the CAP prior to her next appointment to assess response to treatment. I will order without contrast due to kidney function.    We will see her back for labs and follow up in 3 weeks before undergoing cycle #12  The patient was advised to call immediately if she has any concerning symptoms in the interval. The patient voices understanding of current disease status and treatment options and is in agreement with the current care plan. All questions were answered. The patient knows to call the clinic with any problems, questions or concerns. We can certainly see the patient much sooner if necessary   No orders of the defined types were placed in this encounter.    The total time spent in the appointment was 30-39 minutes.  Koray Soter L Deshaun Schou, PA-C 05/04/23

## 2023-05-06 ENCOUNTER — Other Ambulatory Visit: Payer: Self-pay | Admitting: Cardiology

## 2023-05-08 ENCOUNTER — Other Ambulatory Visit: Payer: Self-pay

## 2023-05-08 ENCOUNTER — Ambulatory Visit: Payer: BC Managed Care – PPO

## 2023-05-09 ENCOUNTER — Other Ambulatory Visit: Payer: Self-pay

## 2023-05-09 ENCOUNTER — Inpatient Hospital Stay: Payer: BC Managed Care – PPO | Attending: Physician Assistant

## 2023-05-09 ENCOUNTER — Inpatient Hospital Stay (HOSPITAL_BASED_OUTPATIENT_CLINIC_OR_DEPARTMENT_OTHER): Payer: BC Managed Care – PPO | Admitting: Physician Assistant

## 2023-05-09 ENCOUNTER — Inpatient Hospital Stay: Payer: BC Managed Care – PPO

## 2023-05-09 VITALS — BP 123/66 | HR 74 | Temp 98.0°F | Resp 21

## 2023-05-09 VITALS — BP 100/58 | HR 84 | Temp 97.1°F | Resp 16 | Wt 283.6 lb

## 2023-05-09 DIAGNOSIS — E538 Deficiency of other specified B group vitamins: Secondary | ICD-10-CM | POA: Insufficient documentation

## 2023-05-09 DIAGNOSIS — T451X5A Adverse effect of antineoplastic and immunosuppressive drugs, initial encounter: Secondary | ICD-10-CM | POA: Insufficient documentation

## 2023-05-09 DIAGNOSIS — D6481 Anemia due to antineoplastic chemotherapy: Secondary | ICD-10-CM

## 2023-05-09 DIAGNOSIS — D649 Anemia, unspecified: Secondary | ICD-10-CM

## 2023-05-09 DIAGNOSIS — H538 Other visual disturbances: Secondary | ICD-10-CM | POA: Diagnosis not present

## 2023-05-09 DIAGNOSIS — C3492 Malignant neoplasm of unspecified part of left bronchus or lung: Secondary | ICD-10-CM | POA: Insufficient documentation

## 2023-05-09 DIAGNOSIS — C787 Secondary malignant neoplasm of liver and intrahepatic bile duct: Secondary | ICD-10-CM | POA: Diagnosis not present

## 2023-05-09 DIAGNOSIS — Z5112 Encounter for antineoplastic immunotherapy: Secondary | ICD-10-CM | POA: Diagnosis present

## 2023-05-09 DIAGNOSIS — Z79620 Long term (current) use of immunosuppressive biologic: Secondary | ICD-10-CM | POA: Diagnosis not present

## 2023-05-09 DIAGNOSIS — C3490 Malignant neoplasm of unspecified part of unspecified bronchus or lung: Secondary | ICD-10-CM | POA: Insufficient documentation

## 2023-05-09 DIAGNOSIS — R634 Abnormal weight loss: Secondary | ICD-10-CM | POA: Insufficient documentation

## 2023-05-09 LAB — CMP (CANCER CENTER ONLY)
ALT: 18 U/L (ref 0–44)
AST: 23 U/L (ref 15–41)
Albumin: 3.4 g/dL — ABNORMAL LOW (ref 3.5–5.0)
Alkaline Phosphatase: 74 U/L (ref 38–126)
Anion gap: 7 (ref 5–15)
BUN: 15 mg/dL (ref 6–20)
CO2: 34 mmol/L — ABNORMAL HIGH (ref 22–32)
Calcium: 9.6 mg/dL (ref 8.9–10.3)
Chloride: 103 mmol/L (ref 98–111)
Creatinine: 1.65 mg/dL — ABNORMAL HIGH (ref 0.44–1.00)
GFR, Estimated: 36 mL/min — ABNORMAL LOW (ref >60)
Glucose, Bld: 131 mg/dL — ABNORMAL HIGH (ref 70–99)
Potassium: 3.7 mmol/L (ref 3.5–5.1)
Sodium: 144 mmol/L (ref 135–145)
Total Bilirubin: 0.5 mg/dL (ref 0.0–1.2)
Total Protein: 6.6 g/dL (ref 6.5–8.1)

## 2023-05-09 LAB — CBC WITH DIFFERENTIAL (CANCER CENTER ONLY)
Abs Immature Granulocytes: 0.17 10*3/uL — ABNORMAL HIGH (ref 0.00–0.07)
Basophils Absolute: 0 10*3/uL (ref 0.0–0.1)
Basophils Relative: 0 %
Eosinophils Absolute: 0 10*3/uL (ref 0.0–0.5)
Eosinophils Relative: 1 %
HCT: 21.9 % — ABNORMAL LOW (ref 36.0–46.0)
Hemoglobin: 7.5 g/dL — ABNORMAL LOW (ref 12.0–15.0)
Immature Granulocytes: 3 %
Lymphocytes Relative: 14 %
Lymphs Abs: 0.8 10*3/uL (ref 0.7–4.0)
MCH: 37.5 pg — ABNORMAL HIGH (ref 26.0–34.0)
MCHC: 34.2 g/dL (ref 30.0–36.0)
MCV: 109.5 fL — ABNORMAL HIGH (ref 80.0–100.0)
Monocytes Absolute: 0.5 10*3/uL (ref 0.1–1.0)
Monocytes Relative: 9 %
Neutro Abs: 4 10*3/uL (ref 1.7–7.7)
Neutrophils Relative %: 73 %
Platelet Count: 302 10*3/uL (ref 150–400)
RBC: 2 MIL/uL — ABNORMAL LOW (ref 3.87–5.11)
RDW: 17.5 % — ABNORMAL HIGH (ref 11.5–15.5)
WBC Count: 5.5 10*3/uL (ref 4.0–10.5)
nRBC: 0.7 % — ABNORMAL HIGH (ref 0.0–0.2)

## 2023-05-09 LAB — TYPE AND SCREEN
ABO/RH(D): O POS
Antibody Screen: NEGATIVE

## 2023-05-09 LAB — SAMPLE TO BLOOD BANK

## 2023-05-09 LAB — PREPARE RBC (CROSSMATCH)

## 2023-05-09 LAB — TSH: TSH: 1.977 u[IU]/mL (ref 0.350–4.500)

## 2023-05-09 MED ORDER — SODIUM CHLORIDE 0.9 % IV SOLN
200.0000 mg | Freq: Once | INTRAVENOUS | Status: AC
  Administered 2023-05-09: 200 mg via INTRAVENOUS
  Filled 2023-05-09: qty 200

## 2023-05-09 MED ORDER — DIPHENHYDRAMINE HCL 25 MG PO CAPS
25.0000 mg | ORAL_CAPSULE | Freq: Once | ORAL | Status: AC
  Administered 2023-05-09: 25 mg via ORAL
  Filled 2023-05-09: qty 1

## 2023-05-09 MED ORDER — SODIUM CHLORIDE 0.9% IV SOLUTION
250.0000 mL | INTRAVENOUS | Status: DC
  Administered 2023-05-09: 100 mL via INTRAVENOUS

## 2023-05-09 MED ORDER — SODIUM CHLORIDE 0.9 % IV SOLN: Freq: Once | INTRAVENOUS | Status: AC

## 2023-05-09 MED ORDER — ACETAMINOPHEN 325 MG PO TABS
650.0000 mg | ORAL_TABLET | Freq: Once | ORAL | Status: AC
  Administered 2023-05-09: 650 mg via ORAL
  Filled 2023-05-09: qty 2

## 2023-05-09 NOTE — Progress Notes (Signed)
 Spoke with Tresa Endo in the blood bank to confirm blood orders for today and Friday.

## 2023-05-09 NOTE — Patient Instructions (Signed)
 Blood Transfusion, Adult A blood transfusion is a procedure in which you receive blood or a type of blood cell (blood component) through an IV. You may need a blood transfusion when you have a low blood count, which is a low number of any blood cell. This may result from a bleeding disorder, illness, injury, or surgery. The blood may come from a donor, or you may be able to have your own blood collected and stored (autologous blood donation) before a planned surgery. The blood given in a transfusion may be made up of different blood components. You may receive: Red blood cells. These carry oxygen to the cells in the body. Platelets. These help your blood to clot. Plasma. This is the liquid part of your blood. It carries proteins and other substances throughout the body. White blood cells. These help you fight infections. If you have hemophilia or another clotting disorder, you may also receive other types of blood products. Depending on the type of blood product, this procedure may take 1-4 hours to complete. Tell a health care provider about: Any bleeding problems you have. Any previous reactions you have had during a blood transfusion. Any allergies you have. All medicines you are taking, including vitamins, herbs, eye drops, creams, and over-the-counter medicines. Any surgeries you have had. Any medical conditions you have. Whether you are pregnant or may be pregnant. What are the risks? Talk with your health care provider about risks. The most common problems include: A mild allergic reaction, such as red, swollen areas of skin (hives) and itching. Fever or chills. This may be the body's response to new blood cells received. This may occur during or up to 4 hours after the transfusion. More serious problems may include: A serious allergic reaction that causes difficulty breathing or swelling around the face and lips. Transfusion-associated circulatory overload (TACO), or too much fluid in  the lungs. This may cause breathing problems. Transfusion-related acute lung injury (TRALI), which causes breathing difficulty and low oxygen in the blood. This can occur within hours of the transfusion or several days later. Iron overload. This can happen after receiving many blood transfusions over a period of time. Infection or virus being transmitted. This is rare because donated blood is carefully tested before it is given. Hemolytic transfusion reaction. This is rare. It happens when the body's defense system (immune system)tries to attack the new blood cells. Symptoms may include fever, chills, nausea, low blood pressure, and low back or chest pain. Transfusion-associated graft-versus-host disease (TAGVHD). This is rare. It happens when donated cells attack the body's healthy tissues. What happens before the procedure? You will have a blood test to check your blood type. This test is done to know what kind of blood your body will accept and to match it to the donor blood. If you are going to have a planned surgery, you may be able to do an autologous blood donation. This may be done in case you need to have a transfusion. You will have your temperature, blood pressure, and pulse checked before the transfusion. If you have had an allergic reaction to a transfusion in the past, you may be given medicine to help prevent a reaction. This medicine may be given to you by mouth (orally) or through an IV. What happens during the procedure?  An IV will be inserted into one of your veins. The bag of blood will be attached to your IV. The blood will then enter through your vein. Your temperature, blood pressure,  and pulse will be monitored during the transfusion. This monitoring is done to detect early signs of a transfusion reaction. Tell your nurse right away if you have any of these symptoms during the transfusion: Shortness of breath or trouble breathing. Chest or back pain. Fever or  chills. Itching or hives. If you have any signs or symptoms of a reaction, your transfusion will be stopped and you may be given medicine. When the transfusion is complete, your IV will be removed. Pressure may be applied to the IV site for a few minutes. A bandage (dressing)will be applied. The procedure may vary among health care providers and hospitals. What happens after the procedure? Your temperature, blood pressure, pulse, breathing rate, and blood oxygen level will be monitored until you leave the hospital or clinic. Your blood may be tested to see how you have responded to the transfusion. You may be warmed with fluids or blankets to maintain a normal body temperature. If you receive your blood transfusion in an outpatient setting, you will be told whom to contact to report any reactions. Where to find more information Visit the [employer]: redcross.org Summary A blood transfusion is a procedure in which you receive blood or a type of blood cell (blood component) through an IV. The blood given in a transfusion may be made up of different blood components. You may receive red blood cells, platelets, plasma, or white blood cells depending on the condition treated. Your temperature, blood pressure, and pulse will be monitored before, during, and after the transfusion. After the transfusion, your blood may be tested to see how your body has responded. This information is not intended to replace advice given to you by your health care provider. Make sure you discuss any questions you have with your health care provider. Document Revised: 07/15/2021 Document Reviewed: 07/15/2021 Elsevier Patient Education  2024 Else[employer].  [facility] - A DEPT OF [facility]  Discharge Instructions: Thank you for choosing [facility] to provide your oncology and hematology care.   If you have a lab appointment with the Cancer Center, please go  directly to the Cancer Center and check in at the registration area.   Wear comfortable clothing and clothing appropriate for easy access to any Portacath or PICC line.   We strive to give you quality time with your provider. You may need to reschedule your appointment if you arrive late (15 or more minutes).  Arriving late affects you and other patients whose appointments are after yours.  Also, if you miss three or more appointments without notifying the office, you may be dismissed from the clinic at the provider's discretion.      For prescription refill requests, have your pharmacy contact our office and allow 72 hours for refills to be completed.    Today you received the following chemotherapy and/or immunotherapy agents: Keytruda       To help prevent nausea and vomiting after your treatment, we encourage you to take your nausea medication as directed.  BELOW ARE SYMPTOMS THAT SHOULD BE REPORTED IMMEDIATELY: *FEVER GREATER THAN 100.4 F (38 C) OR HIGHER *CHILLS OR SWEATING *NAUSEA AND VOMITING THAT IS NOT CONTROLLED WITH YOUR NAUSEA MEDICATION *UNUSUAL SHORTNESS OF BREATH *UNUSUAL BRUISING OR BLEEDING *URINARY PROBLEMS (pain or burning when urinating, or frequent urination) *BOWEL PROBLEMS (unusual diarrhea, constipation, pain near the anus) TENDERNESS IN MOUTH AND THROAT WITH OR WITHOUT PRESENCE OF ULCERS (sore throat, sores in mouth,  or a toothache) UNUSUAL RASH, SWELLING OR PAIN  UNUSUAL VAGINAL DISCHARGE OR ITCHING   Items with * indicate a potential emergency and should be followed up as soon as possible or go to the Emergency Department if any problems should occur.  Please show the CHEMOTHERAPY ALERT CARD or IMMUNOTHERAPY ALERT CARD at check-in to the Emergency Department and triage nurse.  Should you have questions after your visit or need to cancel or reschedule your appointment, please contact [facility]  Dept:  [phone]  and follow the prompts.  Office hours are 8:00 a.m. to 4:30 p.m. Monday - Friday. Please note that voicemails left after 4:00 p.m. may not be returned until the following business day.  We are closed weekends and major holidays. You have access to a nurse at all times for urgent questions. Please call the main number to the clinic Dept: [phone] and follow the prompts.   For any non-urgent questions, you may also contact your provider using MyChart. We now offer e-Visits for anyone 3 and older to request care online for non-urgent symptoms. For details visit [email or web address].   Also download the MyChart app! Go to the app store, search MyChart, open the app, select [facility], and log in with your MyChart username and password.

## 2023-05-09 NOTE — Progress Notes (Signed)
 Per Cassie Heilingoetter, PA-C, pt to receive 1 unit blood for Hgb 7.5.  OK to administer Keytruda today with low Hgb and Cr 1.65 per PA.

## 2023-05-09 NOTE — Patient Instructions (Addendum)
-  It was nice seeing you today.  Here is a summary of the main take away points that we discussed at your appointment. -I think your dizziness could be secondary to anemia.  Anemia is when your red blood cells are low.  Red blood cells are important to carry oxygen around her body.  Therefore if your red blood cells are low, patient's may have increased fatigue, dizziness, and sometimes shortness of breath with exertion.  The anemia is likely due to your chemotherapy (Alimta /pemetrexed ).   -For the anemia, the plan is to:   -Continue to take folic acid  every day -We will arrange for 2 units of blood this week.  We will give you 1 unit of blood today and the other unit of blood on Friday.  Please make sure you are wearing your blue bracelet when you come in Friday -I would like to add lab appointments weekly so we can make sure you do not require any additional blood transfusions between now and your next appointment -Just to be on the safe side, I will arrange for repeat brain MRI to make sure the dizziness is not related to another brain related etiology   -I have sent you home with stool cards to make sure there is no blood in your stool.  -Losing blood through the gastrointestinal tract could also cause anemia.  Luckily, you are up-to-date on your colonoscopies.  Please return your stool cards to the clinic at your earliest availability.  We would like to test 2-3 stools if possible -They will be calling you from the radiology department to schedule your repeat CT scan of your chest, abdomen, and pelvis.  The scan will check on how the treatment is working.  They should be calling you from the scanning department but you are also welcome to call them to get the scan scheduled.  Their number is [phone].  When you talk to them press option 3 or stay on the line until you talk to representative.  They will help get you scheduled.  I would like for your scan to be performed around 1/21-1/22 -The skin  darkening and dry skin on your lower legs is probably from the chemotherapy which can cause increased pigmentation/darkening of the skin.  For the dry skin please apply plenty of lotion and cream -Your kidney function labs came back a little more out of range today. You may be dehydrated. Please try to increase your intake of water  of the next few days. Due to the kidney function lab today (GFR and creatinine), we actually are not able to give the chemotherapy (alimta  today). Therefore, you will only receive the immunotherapy. Hopefully that means that this round of treatment is easier with less side effects.

## 2023-05-10 LAB — T4: T4, Total: 12.5 ug/dL — ABNORMAL HIGH (ref 4.5–12.0)

## 2023-05-11 ENCOUNTER — Inpatient Hospital Stay: Payer: BC Managed Care – PPO

## 2023-05-11 DIAGNOSIS — Z5112 Encounter for antineoplastic immunotherapy: Secondary | ICD-10-CM | POA: Diagnosis not present

## 2023-05-11 DIAGNOSIS — D649 Anemia, unspecified: Secondary | ICD-10-CM

## 2023-05-11 MED ORDER — DIPHENHYDRAMINE HCL 25 MG PO CAPS
25.0000 mg | ORAL_CAPSULE | Freq: Once | ORAL | Status: AC
  Administered 2023-05-11: 25 mg via ORAL
  Filled 2023-05-11: qty 1

## 2023-05-11 MED ORDER — ACETAMINOPHEN 325 MG PO TABS
650.0000 mg | ORAL_TABLET | Freq: Once | ORAL | Status: AC
  Administered 2023-05-11: 650 mg via ORAL
  Filled 2023-05-11: qty 2

## 2023-05-11 MED ORDER — SODIUM CHLORIDE 0.9% IV SOLUTION
250.0000 mL | INTRAVENOUS | Status: DC
  Administered 2023-05-11: 100 mL via INTRAVENOUS

## 2023-05-11 NOTE — Patient Instructions (Signed)
 Blood Transfusion, Adult A blood transfusion is a procedure in which you receive blood or a type of blood cell (blood component) through an IV. You may need a blood transfusion when you have a low blood count, which is a low number of any blood cell. This may result from a bleeding disorder, illness, injury, or surgery. The blood may come from a donor, or you may be able to have your own blood collected and stored (autologous blood donation) before a planned surgery. The blood given in a transfusion may be made up of different blood components. You may receive: Red blood cells. These carry oxygen to the cells in the body. Platelets. These help your blood to clot. Plasma. This is the liquid part of your blood. It carries proteins and other substances throughout the body. White blood cells. These help you fight infections. If you have hemophilia or another clotting disorder, you may also receive other types of blood products. Depending on the type of blood product, this procedure may take 1-4 hours to complete. Tell a health care provider about: Any bleeding problems you have. Any previous reactions you have had during a blood transfusion. Any allergies you have. All medicines you are taking, including vitamins, herbs, eye drops, creams, and over-the-counter medicines. Any surgeries you have had. Any medical conditions you have. Whether you are pregnant or may be pregnant. What are the risks? Talk with your health care provider about risks. The most common problems include: A mild allergic reaction, such as red, swollen areas of skin (hives) and itching. Fever or chills. This may be the body's response to new blood cells received. This may occur during or up to 4 hours after the transfusion. More serious problems may include: A serious allergic reaction that causes difficulty breathing or swelling around the face and lips. Transfusion-associated circulatory overload (TACO), or too much fluid in  the lungs. This may cause breathing problems. Transfusion-related acute lung injury (TRALI), which causes breathing difficulty and low oxygen in the blood. This can occur within hours of the transfusion or several days later. Iron overload. This can happen after receiving many blood transfusions over a period of time. Infection or virus being transmitted. This is rare because donated blood is carefully tested before it is given. Hemolytic transfusion reaction. This is rare. It happens when the body's defense system (immune system)tries to attack the new blood cells. Symptoms may include fever, chills, nausea, low blood pressure, and low back or chest pain. Transfusion-associated graft-versus-host disease (TAGVHD). This is rare. It happens when donated cells attack the body's healthy tissues. What happens before the procedure? You will have a blood test to check your blood type. This test is done to know what kind of blood your body will accept and to match it to the donor blood. If you are going to have a planned surgery, you may be able to do an autologous blood donation. This may be done in case you need to have a transfusion. You will have your temperature, blood pressure, and pulse checked before the transfusion. If you have had an allergic reaction to a transfusion in the past, you may be given medicine to help prevent a reaction. This medicine may be given to you by mouth (orally) or through an IV. What happens during the procedure?  An IV will be inserted into one of your veins. The bag of blood will be attached to your IV. The blood will then enter through your vein. Your temperature, blood pressure,  and pulse will be monitored during the transfusion. This monitoring is done to detect early signs of a transfusion reaction. Tell your nurse right away if you have any of these symptoms during the transfusion: Shortness of breath or trouble breathing. Chest or back pain. Fever or  chills. Itching or hives. If you have any signs or symptoms of a reaction, your transfusion will be stopped and you may be given medicine. When the transfusion is complete, your IV will be removed. Pressure may be applied to the IV site for a few minutes. A bandage (dressing)will be applied. The procedure may vary among health care providers and hospitals. What happens after the procedure? Your temperature, blood pressure, pulse, breathing rate, and blood oxygen level will be monitored until you leave the hospital or clinic. Your blood may be tested to see how you have responded to the transfusion. You may be warmed with fluids or blankets to maintain a normal body temperature. If you receive your blood transfusion in an outpatient setting, you will be told whom to contact to report any reactions. Where to find more information Visit the [employer]: redcross.org Summary A blood transfusion is a procedure in which you receive blood or a type of blood cell (blood component) through an IV. The blood given in a transfusion may be made up of different blood components. You may receive red blood cells, platelets, plasma, or white blood cells depending on the condition treated. Your temperature, blood pressure, and pulse will be monitored before, during, and after the transfusion. After the transfusion, your blood may be tested to see how your body has responded. This information is not intended to replace advice given to you by your health care provider. Make sure you discuss any questions you have with your health care provider. Document Revised: 07/15/2021 Document Reviewed: 07/15/2021 Elsevier Patient Education  2024 Else[employer].  [facility] - A DEPT OF [facility]  Discharge Instructions: Thank you for choosing [facility] to provide your oncology and hematology care.   If you have a lab appointment with the Cancer Center, please go  directly to the Cancer Center and check in at the registration area.   Wear comfortable clothing and clothing appropriate for easy access to any Portacath or PICC line.   We strive to give you quality time with your provider. You may need to reschedule your appointment if you arrive late (15 or more minutes).  Arriving late affects you and other patients whose appointments are after yours.  Also, if you miss three or more appointments without notifying the office, you may be dismissed from the clinic at the provider's discretion.      For prescription refill requests, have your pharmacy contact our office and allow 72 hours for refills to be completed.    Today you received the following chemotherapy and/or immunotherapy agents: Keytruda       To help prevent nausea and vomiting after your treatment, we encourage you to take your nausea medication as directed.  BELOW ARE SYMPTOMS THAT SHOULD BE REPORTED IMMEDIATELY: *FEVER GREATER THAN 100.4 F (38 C) OR HIGHER *CHILLS OR SWEATING *NAUSEA AND VOMITING THAT IS NOT CONTROLLED WITH YOUR NAUSEA MEDICATION *UNUSUAL SHORTNESS OF BREATH *UNUSUAL BRUISING OR BLEEDING *URINARY PROBLEMS (pain or burning when urinating, or frequent urination) *BOWEL PROBLEMS (unusual diarrhea, constipation, pain near the anus) TENDERNESS IN MOUTH AND THROAT WITH OR WITHOUT PRESENCE OF ULCERS (sore throat, sores in mouth,  or a toothache) UNUSUAL RASH, SWELLING OR PAIN  UNUSUAL VAGINAL DISCHARGE OR ITCHING   Items with * indicate a potential emergency and should be followed up as soon as possible or go to the Emergency Department if any problems should occur.  Please show the CHEMOTHERAPY ALERT CARD or IMMUNOTHERAPY ALERT CARD at check-in to the Emergency Department and triage nurse.  Should you have questions after your visit or need to cancel or reschedule your appointment, please contact [facility]  Dept:  [phone]  and follow the prompts.  Office hours are 8:00 a.m. to 4:30 p.m. Monday - Friday. Please note that voicemails left after 4:00 p.m. may not be returned until the following business day.  We are closed weekends and major holidays. You have access to a nurse at all times for urgent questions. Please call the main number to the clinic Dept: [phone] and follow the prompts.   For any non-urgent questions, you may also contact your provider using MyChart. We now offer e-Visits for anyone 3 and older to request care online for non-urgent symptoms. For details visit [email or web address].   Also download the MyChart app! Go to the app store, search MyChart, open the app, select [facility], and log in with your MyChart username and password.

## 2023-05-12 ENCOUNTER — Other Ambulatory Visit: Payer: Self-pay

## 2023-05-14 ENCOUNTER — Encounter: Payer: Self-pay | Admitting: Internal Medicine

## 2023-05-14 ENCOUNTER — Other Ambulatory Visit: Payer: Self-pay

## 2023-05-14 ENCOUNTER — Telehealth: Payer: Self-pay

## 2023-05-14 DIAGNOSIS — R531 Weakness: Secondary | ICD-10-CM

## 2023-05-14 DIAGNOSIS — C3492 Malignant neoplasm of unspecified part of left bronchus or lung: Secondary | ICD-10-CM

## 2023-05-14 LAB — BPAM RBC
Blood Product Expiration Date: 202502022359
Blood Product Expiration Date: 202502022359
ISSUE DATE / TIME: 202501081340
ISSUE DATE / TIME: 202501100839
Unit Type and Rh: 5100
Unit Type and Rh: 5100

## 2023-05-14 LAB — TYPE AND SCREEN
ABO/RH(D): O POS
Antibody Screen: NEGATIVE
Unit division: 0
Unit division: 0

## 2023-05-14 NOTE — Telephone Encounter (Signed)
 Patient called in complaining of weakness.  She stated that she could not get out of the bath tub and had to call someone to help her.  Also, had trouble getting on and off her scooter. Appt made to see Mallie, [place] in [facility] tomorrow with labs before. Patient verbalized understanding and was told to call with any concerns or go to ER if needed.

## 2023-05-15 ENCOUNTER — Inpatient Hospital Stay: Payer: BC Managed Care – PPO

## 2023-05-15 ENCOUNTER — Ambulatory Visit
Admission: RE | Admit: 2023-05-15 | Discharge: 2023-05-15 | Disposition: A | Discharge status: Home / Self Care | Payer: BC Managed Care – PPO | Source: Ambulatory Visit | Attending: Nurse Practitioner | Admitting: Nurse Practitioner

## 2023-05-15 ENCOUNTER — Other Ambulatory Visit: Payer: BC Managed Care – PPO

## 2023-05-15 ENCOUNTER — Encounter: Payer: Self-pay | Admitting: General Practice

## 2023-05-15 ENCOUNTER — Other Ambulatory Visit: Payer: Self-pay

## 2023-05-15 ENCOUNTER — Inpatient Hospital Stay (HOSPITAL_BASED_OUTPATIENT_CLINIC_OR_DEPARTMENT_OTHER): Payer: BC Managed Care – PPO | Admitting: Physician Assistant

## 2023-05-15 VITALS — BP 146/81 | HR 73 | Temp 97.9°F | Resp 16

## 2023-05-15 DIAGNOSIS — D6481 Anemia due to antineoplastic chemotherapy: Secondary | ICD-10-CM

## 2023-05-15 DIAGNOSIS — C3492 Malignant neoplasm of unspecified part of left bronchus or lung: Secondary | ICD-10-CM | POA: Diagnosis not present

## 2023-05-15 DIAGNOSIS — R531 Weakness: Secondary | ICD-10-CM

## 2023-05-15 DIAGNOSIS — M79604 Pain in right leg: Secondary | ICD-10-CM | POA: Diagnosis not present

## 2023-05-15 DIAGNOSIS — Z1231 Encounter for screening mammogram for malignant neoplasm of breast: Secondary | ICD-10-CM

## 2023-05-15 DIAGNOSIS — T451X5A Adverse effect of antineoplastic and immunosuppressive drugs, initial encounter: Secondary | ICD-10-CM | POA: Diagnosis not present

## 2023-05-15 DIAGNOSIS — Z5112 Encounter for antineoplastic immunotherapy: Secondary | ICD-10-CM | POA: Diagnosis not present

## 2023-05-15 LAB — CMP (CANCER CENTER ONLY)
ALT: 14 U/L (ref 0–44)
AST: 24 U/L (ref 15–41)
Albumin: 3.3 g/dL — ABNORMAL LOW (ref 3.5–5.0)
Alkaline Phosphatase: 68 U/L (ref 38–126)
Anion gap: 5 (ref 5–15)
BUN: 11 mg/dL (ref 6–20)
CO2: 35 mmol/L — ABNORMAL HIGH (ref 22–32)
Calcium: 10 mg/dL (ref 8.9–10.3)
Chloride: 103 mmol/L (ref 98–111)
Creatinine: 1.44 mg/dL — ABNORMAL HIGH (ref 0.44–1.00)
GFR, Estimated: 42 mL/min — ABNORMAL LOW (ref >60)
Glucose, Bld: 139 mg/dL — ABNORMAL HIGH (ref 70–99)
Potassium: 3.7 mmol/L (ref 3.5–5.1)
Sodium: 143 mmol/L (ref 135–145)
Total Bilirubin: 0.6 mg/dL (ref 0.0–1.2)
Total Protein: 6.5 g/dL (ref 6.5–8.1)

## 2023-05-15 LAB — CBC WITH DIFFERENTIAL (CANCER CENTER ONLY)
Abs Immature Granulocytes: 0.06 10*3/uL (ref 0.00–0.07)
Basophils Absolute: 0 10*3/uL (ref 0.0–0.1)
Basophils Relative: 0 %
Eosinophils Absolute: 0 10*3/uL (ref 0.0–0.5)
Eosinophils Relative: 0 %
HCT: 30.6 % — ABNORMAL LOW (ref 36.0–46.0)
Hemoglobin: 10.3 g/dL — ABNORMAL LOW (ref 12.0–15.0)
Immature Granulocytes: 1 %
Lymphocytes Relative: 11 %
Lymphs Abs: 0.6 10*3/uL — ABNORMAL LOW (ref 0.7–4.0)
MCH: 34.7 pg — ABNORMAL HIGH (ref 26.0–34.0)
MCHC: 33.7 g/dL (ref 30.0–36.0)
MCV: 103 fL — ABNORMAL HIGH (ref 80.0–100.0)
Monocytes Absolute: 0.5 10*3/uL (ref 0.1–1.0)
Monocytes Relative: 10 %
Neutro Abs: 4.2 10*3/uL (ref 1.7–7.7)
Neutrophils Relative %: 78 %
Platelet Count: 221 10*3/uL (ref 150–400)
RBC: 2.97 MIL/uL — ABNORMAL LOW (ref 3.87–5.11)
RDW: 19.9 % — ABNORMAL HIGH (ref 11.5–15.5)
WBC Count: 5.4 10*3/uL (ref 4.0–10.5)
nRBC: 0 % (ref 0.0–0.2)

## 2023-05-15 LAB — SAMPLE TO BLOOD BANK

## 2023-05-15 LAB — MAGNESIUM: Magnesium: 1.6 mg/dL — ABNORMAL LOW (ref 1.7–2.4)

## 2023-05-15 NOTE — Progress Notes (Signed)
 [facility] Spiritual Care Note  Referred by [facility] for additional layer of emotional support. Left follow-up voicemail encouraging return call.   [address] [place], [place], [facility] Pager [phone] Voicemail [phone]

## 2023-05-15 NOTE — Progress Notes (Signed)
 Symptom Management Consult Note [facility]    Patient Care Team: [name], Roselie Rockford, NP as PCP - General (Internal Medicine) Lavona Agent, MD as PCP - Cardiology (Cardiology)    Name / MRN / DOB: Madison Palmer  [id number]  April 20, 1966   Date of visit: 05/15/2023   Chief Complaint/Reason for visit: weakness   Current Therapy: Keytruda  and alimta   Last treatment:  Day 1   Cycle 11 on 05/09/23   ASSESSMENT & PLAN: Patient is a 58 y.o. female with oncologic history of stage IV non-small cell lung cancer, adenocarcinoma followed by Dr. Sherrod.  I have viewed most recent oncology note and lab work.    #Stage IV non-small cell lung cancer, adenocarcinoma  -Alimta  held last treatment because of elevated creatine. Per note they will consider lowering Alimta  dose in the future. - Next appointment with oncologist is 05/29/23   #Anemia Recent blood transfusion improved hemoglobin to 10.3. Anemia likely secondary to treatment. No indication for transfusion today.  #Elevated creatinine - Ensure adequate hydration to support kidney function. Creatinine is improved today from 1.65 x 6 days ago to 1.44 today. She prefers to continue PO intake and not have IVF today.  #Right Leg Weakness Right leg weakness for two weeks, significant mobility difficulty noted, however it is improved d today. No numbness, tingling, or swelling. Weakness localized to the thigh. History of right knee arthritis, last gel injection over a year ago. - Encouraged patient to follow up with orthopedics to see if injection is needed. -Advised against bathtub use to prevent falls and recommended a shower chair for safety. -Engaged in shared decision making regarding xray of knee today. Patient does not feel this is necessary and she would rather follow up with orthopedics. Exam is not consistent with DVT, patient agrees with no US  at this time. If she develops swelling or pain she knows to RTC for  repeat evaluation. -Offered to move up patients scans CT CAP and MRI brain however patient prefers to keep them as scheduled on 05/22/23.    Strict ED precautions discussed should symptoms worsen.   Heme/Onc History: Oncology History  Metastatic lung cancer (metastasis from lung to other site) (HCC)  09/28/2022 Initial Diagnosis   Metastatic lung cancer (metastasis from lung to other site) [facility])   10/10/2022 -  Chemotherapy   Patient is on Treatment Plan : LUNG Carboplatin  (5) + Pemetrexed  (500) + Pembrolizumab  (200) D1 q21d Induction x 4 cycles / Maintenance Pemetrexed  (500) + Pembrolizumab  (200) D1 q21d     Adenocarcinoma of left lung, stage 4 (HCC)  09/28/2022 Initial Diagnosis   Adenocarcinoma of left lung, stage 4 (HCC)   09/28/2022 Cancer Staging   Staging form: Lung, AJCC 8th Edition - Clinical: Stage IVB (cT2a, cN3, cM1c) - Signed by Sherrod Sherrod, MD on 09/28/2022       Interval history-: Discussed the use of AI scribe software for clinical note transcription with the patient, who gave verbal consent to proceed.   Madison Palmer is a 58 y.o. female with oncologic history as above presenting to [facility] today with chief complaint of weakness. Patient is accompanied by family member who provides additional history.  The patient presents with right leg weakness. The weakness, primarily in the thigh, was first noticed approximately two weeks ago, but became problematic when the patient was unable to lift her leg over the edge of the bathtub x 2 days ago. The patient reports no associated numbness or tingling  and denies any recent injuries to the leg. The weakness persists, making it difficult for the patient to bear weight and walk, although she has not experienced any falls. She reports walking today has been easier than it was yesterday. She has a history of arthritis in her right leg. She has had gel injections in the past with orthopedics. She has not had an injection in over 1  year. The patient recently received a blood transfusion, after which she felt temporarily better. She has been drinking approximately 80 ounces of fluids daily, The patient is due for scans and an MRI in the coming week.  ROS  All other systems are reviewed and are negative for acute change except as noted in the HPI.    Allergies  Allergen Reactions   Other Other (See Comments)    Hydralazine  Hcl Other reaction(s): Other (See Comments) Instructed not to take by Cardiology.   Fentanyl  Hives    Other reaction(s): hives had fentanyl  recently with benadryl , did OK   Levofloxacin Hives   Midazolam  Hives   Pollen Extract     seasonal   Atorvastatin  Other (See Comments)    Muscle pain in legs  Abdominal pain   Hydralazine  Hcl Other (See Comments)    Hypercalcemia    Rosuvastatin Other (See Comments)    Abdominal pain Other reaction(s): diarrhea     Past Medical History:  Diagnosis Date   Acute on chronic respiratory failure with hypoxia and hypercapnia (HCC) 09/15/2018   Anemia    Anxiety    Arrhythmia    tachycardia   Arthritis    Bipolar 1 disorder (HCC) 09/12/2018   Bipolar I disorder, current or most recent episode manic, with psychotic features (HCC)    Brief psychotic disorder (HCC) 08/22/2018   Chronic respiratory failure with hypoxia (HCC) 05/10/2018   Formatting of this note might be different from the original. Last Assessment & Plan:  Continue 1 L continuous on exertion and during sleep. On her next visit we will check OSA on CPAP/room air to decide whether she needs to take oxygen along with her on her cruise in May   Depression    Diverticulitis    Generalized edema 03/12/2018   GERD (gastroesophageal reflux disease)    Glaucoma    Hyperlipidemia    Hyperparathyroidism (HCC)    Hypertension    Inflammatory polyps of colon (HCC)    Leg swelling 01/14/2019   LVH (left ventricular hypertrophy)    Lymphedema    Morbid (severe) obesity due to excess calories  (HCC) 05/11/2017   PCOS (polycystic ovarian syndrome)    Peripheral vascular disease (HCC)    Prediabetes    Recurrent UTI    Sleep apnea    CPAP   Thyroid  disease    Vitamin D  deficiency      Past Surgical History:  Procedure Laterality Date   BIOPSY  09/27/2022   Procedure: BIOPSY;  Surgeon: Wilhelmenia Aloha Raddle., MD;  Location: [facility] ENDOSCOPY;  Service: Gastroenterology;;   BREAST BIOPSY  2015   CESAREAN SECTION  2004   CHOLECYSTECTOMY     COLONOSCOPY WITH PROPOFOL  N/A 09/27/2022   Procedure: COLONOSCOPY WITH PROPOFOL ;  Surgeon: Wilhelmenia Aloha Raddle., MD;  Location: [facility] ENDOSCOPY;  Service: Gastroenterology;  Laterality: N/A;   ESOPHAGOGASTRODUODENOSCOPY (EGD) WITH PROPOFOL  N/A 09/27/2022   Procedure: ESOPHAGOGASTRODUODENOSCOPY (EGD) WITH PROPOFOL ;  Surgeon: Wilhelmenia Aloha Raddle., MD;  Location: [facility] ENDOSCOPY;  Service: Gastroenterology;  Laterality: N/A;   INNER EAR SURGERY  ear and sinus surgery   LAPAROSCOPIC REPAIR AND REMOVAL OF GASTRIC BAND     MALONEY DILATION  09/27/2022   Procedure: MALONEY DILATION;  Surgeon: Wilhelmenia Aloha Raddle., MD;  Location: [facility] ENDOSCOPY;  Service: Gastroenterology;;   OOPHORECTOMY Left    POLYPECTOMY  09/27/2022   Procedure: POLYPECTOMY;  Surgeon: Wilhelmenia Aloha Raddle., MD;  Location: [facility] ENDOSCOPY;  Service: Gastroenterology;;   TONSILLECTOMY AND ADENOIDECTOMY      Social History   Socioeconomic History   Marital status: Married    Spouse name: Not on file   Number of children: 1   Years of education: Not on file   Highest education level: Not on file  Occupational History   Occupation: Unemployed  Tobacco Use   Smoking status: Never   Smokeless tobacco: Never  Vaping Use   Vaping status: Never Used  Substance and Sexual Activity   Alcohol  use: Yes    Comment: social   Drug use: No   Sexual activity: Not Currently  Other Topics Concern   Not on file  Social History Narrative   Lives with son    Currently separated from  spouse   Left handed   Caffeine: coffee daily: half-caff 3 cups/day and decaf maybe 3-5 cups/day, occasional mtn dew, tea 1-2 times per week   Social Drivers of Health   Financial Resource Strain: Low Risk  (07/03/2022)   Overall Financial Resource Strain (CARDIA)    Difficulty of Paying Living Expenses: Not hard at all  Food Insecurity: No Food Insecurity (07/03/2022)   Hunger Vital Sign    Worried About Running Out of Food in the Last Year: Never true    Ran Out of Food in the Last Year: Never true  Transportation Needs: No Transportation Needs (07/03/2022)   PRAPARE - Administrator, Civil Service (Medical): No    Lack of Transportation (Non-Medical): No  Physical Activity: Inactive (07/03/2022)   Exercise Vital Sign    Days of Exercise per Week: 0 days    Minutes of Exercise per Session: 0 min  Stress: No Stress Concern Present (07/03/2022)   [employer] of Occupational Health - Occupational Stress Questionnaire    Feeling of Stress : Not at all  Social Connections: Unknown (11/11/2018)   Received from [facility]   Social Connection and Isolation Panel [NHANES]    Frequency of Communication with Friends and Family: Patient declined    Frequency of Social Gatherings with Friends and Family: Patient declined    Attends Religious Services: Patient declined    Database Administrator or Organizations: Patient declined    Attends Banker Meetings: Patient declined    Marital Status: Patient declined  Intimate Partner Violence: Unknown (08/02/2021)   Received from [employer], [facility]   HITS    Physically Hurt: Not on file    Insult or Talk Down To: Not on file    Threaten Physical Harm: Not on file    Scream or Curse: Not on file    Family History  Adopted: Yes  Problem Relation Age of Onset   Hearing loss Son        right    Healthy Son    Breast cancer Neg Hx      Current Outpatient  Medications:    acetaminophen  (TYLENOL ) 500 MG tablet, Take 1,000 mg by mouth 3 (three) times daily as needed for moderate pain or headache., Disp: , Rfl:  amLODipine  (NORVASC ) 10 MG tablet, TAKE 1 TABLET BY MOUTH EVERY DAY, Disp: 90 tablet, Rfl: 3   atorvastatin  (LIPITOR) 20 MG tablet, TAKE 1 TABLET(20 MG) BY MOUTH 3 TIMES A WEEK, Disp: 12 tablet, Rfl: 2   benztropine  (COGENTIN ) 0.5 MG tablet, Take 1 tablet (0.5 mg total) by mouth at bedtime., Disp: 30 tablet, Rfl: 2   cetirizine  (ZYRTEC ) 10 MG tablet, TAKE 1 TABLET BY MOUTH EVERY DAY, Disp: 90 tablet, Rfl: 1   Cholecalciferol  (VITAMIN D3 MAXIMUM STRENGTH) 125 MCG (5000 UT) capsule, Take 5,000 Units by mouth daily., Disp: , Rfl:    cinacalcet (SENSIPAR) 30 MG tablet, Take 30 mg by mouth daily., Disp: , Rfl:    cloNIDine  (CATAPRES ) 0.1 MG tablet, TAKE 1 TABLET(0.1 MG) BY MOUTH THREE TIMES DAILY, Disp: 90 tablet, Rfl: 3   esomeprazole  (NEXIUM ) 40 MG capsule, TAKE 1 CAPSULE BY MOUTH TWICE DAILY, Disp: 180 capsule, Rfl: 2   fenofibrate  (TRICOR ) 145 MG tablet, TAKE 1 TABLET(145 MG) BY MOUTH DAILY, Disp: 90 tablet, Rfl: 3   folic acid  (FOLVITE ) 1 MG tablet, TAKE 1 TABLET BY MOUTH DAILY, Disp: 30 tablet, Rfl: 2   furosemide  (LASIX ) 40 MG tablet, TAKE 1 TABLET BY MOUTH EVERY DAY, Disp: 90 tablet, Rfl: 3   gabapentin  (NEURONTIN ) 100 MG capsule, TAKE 2 CAPSULES(200 MG) BY MOUTH AT BEDTIME, Disp: 180 capsule, Rfl: 3   labetalol  (NORMODYNE ) 300 MG tablet, TAKE 1 AND 1/2 TABLETS BY MOUTH TWICE DAILY, Disp: 270 tablet, Rfl: 0   levothyroxine  (SYNTHROID ) 88 MCG tablet, Take 88 mcg by mouth daily., Disp: , Rfl:    lisinopril  (ZESTRIL ) 40 MG tablet, TAKE 1 TABLET BY MOUTH EVERY DAY, Disp: 90 tablet, Rfl: 1   lurasidone  (LATUDA ) 40 MG TABS tablet, Take 1 tablet (40 mg total) by mouth daily with breakfast., Disp: 30 tablet, Rfl: 2   Polyethylene Glycol 3350  (MIRALAX  PO), Take 1 Capful by mouth daily as needed (Constipation)., Disp: , Rfl:    Potassium Chloride  ER  20 MEQ TBCR, TAKE 1 TABLET BY MOUTH EVERY DAY, Disp: 90 tablet, Rfl: 3   prochlorperazine  (COMPAZINE ) 10 MG tablet, Take 1 tablet (10 mg total) by mouth every 6 (six) hours as needed., Disp: 30 tablet, Rfl: 2   TART CHERRY PO, Take 1 tablet by mouth daily., Disp: , Rfl:   PHYSICAL EXAM: ECOG FS:1 - Symptomatic but completely ambulatory    Vitals:   05/15/23 1124  BP: (!) 146/81  Pulse: 73  Resp: 16  Temp: 97.9 F (36.6 C)  TempSrc: Oral  SpO2: 97%   Physical Exam Vitals and nursing note reviewed.  Constitutional:      Appearance: She is not ill-appearing or toxic-appearing.  HENT:     Head: Normocephalic.  Eyes:     Conjunctiva/sclera: Conjunctivae normal.  Cardiovascular:     Rate and Rhythm: Normal rate and regular rhythm.     Pulses: Normal pulses.     Heart sounds: Normal heart sounds.  Pulmonary:     Effort: Pulmonary effort is normal.     Breath sounds: Normal breath sounds.  Abdominal:     General: There is no distension.  Musculoskeletal:     Cervical back: Normal range of motion.     Right lower leg: No edema.     Left lower leg: No edema.     Comments: Ambulatory with cane. Normal gait. Full ROM of RLE. No obvious deformity. Homans sign absent bilaterally, no lower extremity edema, no palpable cords, compartments are  soft.  Skin:    General: Skin is warm and dry.  Neurological:     Mental Status: She is alert.  Psychiatric:        Mood and Affect: Mood is anxious.        LABORATORY DATA: I have reviewed the data as listed    Latest Ref Rng & Units 05/15/2023   10:56 AM 05/09/2023   11:20 AM 04/23/2023   11:37 AM  CBC  WBC 4.0 - 10.5 K/uL 5.4  5.5  1.6   Hemoglobin 12.0 - 15.0 g/dL 89.6  7.5  7.8   Hematocrit 36.0 - 46.0 % 30.6  21.9  23.6   Platelets 150 - 400 K/uL 221  302  214         Latest Ref Rng & Units 05/15/2023   10:56 AM 05/09/2023   11:20 AM 04/23/2023   11:37 AM  CMP  Glucose 70 - 99 mg/dL 860  868  863   BUN 6 - 20 mg/dL 11   15  18    Creatinine 0.44 - 1.00 mg/dL 8.55  8.34  8.72   Sodium 135 - 145 mmol/L 143  144  144   Potassium 3.5 - 5.1 mmol/L 3.7  3.7  3.9   Chloride 98 - 111 mmol/L 103  103  104   CO2 22 - 32 mmol/L 35  34  34   Calcium  8.9 - 10.3 mg/dL 89.9  9.6  9.8   Total Protein 6.5 - 8.1 g/dL 6.5  6.6  6.4   Total Bilirubin 0.0 - 1.2 mg/dL 0.6  0.5  0.7   Alkaline Phos 38 - 126 U/L 68  74  64   AST 15 - 41 U/L 24  23  36   ALT 0 - 44 U/L 14  18  38        RADIOGRAPHIC STUDIES (from last 24 hours if applicable) I have personally reviewed the radiological images as listed and agreed with the findings in the report. No results found.      Visit Diagnosis: 1. Acute leg pain, right   2. Anemia due to antineoplastic chemotherapy   3. Adenocarcinoma of left lung, stage 4 (HCC)      No orders of the defined types were placed in this encounter.   All questions were answered. The patient knows to call the clinic with any problems, questions or concerns. No barriers to learning was detected.  A total of more than 30 minutes were spent on this encounter with face-to-face time and non-face-to-face time, including preparing to see the patient, ordering tests and/or medications, counseling the patient and coordination of care as outlined above.    Thank you for allowing me to participate in the care of this patient.    Neema Barreira E  Walisiewicz, PA-C Department of Hematology/Oncology [facility] at [facility] Phone: [phone]  [phone]    05/15/2023 2:35 PM

## 2023-05-18 ENCOUNTER — Telehealth: Payer: Self-pay

## 2023-05-18 NOTE — Telephone Encounter (Signed)
Spoke with patient in regards to a shower chair.  Informed patient that insurance does not pay for a shower chair per [employer].  Informed patient of a service called [employer] recommended by social work. Gave patient the contact [phone].  Another place to contact is [facility] in [place] if no success with [employer]. Informed patient to call us if any questions or concerns with the shower chair.

## 2023-05-22 ENCOUNTER — Ambulatory Visit (HOSPITAL_COMMUNITY)
Admission: RE | Admit: 2023-05-22 | Discharge: 2023-05-22 | Disposition: A | Discharge status: Home / Self Care | Payer: BC Managed Care – PPO | Source: Ambulatory Visit | Attending: Physician Assistant | Admitting: Physician Assistant

## 2023-05-22 ENCOUNTER — Inpatient Hospital Stay: Payer: BC Managed Care – PPO

## 2023-05-22 DIAGNOSIS — D6481 Anemia due to antineoplastic chemotherapy: Secondary | ICD-10-CM

## 2023-05-22 DIAGNOSIS — T451X5A Adverse effect of antineoplastic and immunosuppressive drugs, initial encounter: Secondary | ICD-10-CM | POA: Insufficient documentation

## 2023-05-22 DIAGNOSIS — Z5112 Encounter for antineoplastic immunotherapy: Secondary | ICD-10-CM | POA: Diagnosis not present

## 2023-05-22 DIAGNOSIS — C3492 Malignant neoplasm of unspecified part of left bronchus or lung: Secondary | ICD-10-CM

## 2023-05-22 DIAGNOSIS — D649 Anemia, unspecified: Secondary | ICD-10-CM

## 2023-05-22 LAB — CBC WITH DIFFERENTIAL (CANCER CENTER ONLY)
Abs Immature Granulocytes: 0.07 10*3/uL (ref 0.00–0.07)
Basophils Absolute: 0 10*3/uL (ref 0.0–0.1)
Basophils Relative: 0 %
Eosinophils Absolute: 0 10*3/uL (ref 0.0–0.5)
Eosinophils Relative: 0 %
HCT: 34.1 % — ABNORMAL LOW (ref 36.0–46.0)
Hemoglobin: 11.1 g/dL — ABNORMAL LOW (ref 12.0–15.0)
Immature Granulocytes: 1 %
Lymphocytes Relative: 8 %
Lymphs Abs: 0.6 10*3/uL — ABNORMAL LOW (ref 0.7–4.0)
MCH: 34.3 pg — ABNORMAL HIGH (ref 26.0–34.0)
MCHC: 32.6 g/dL (ref 30.0–36.0)
MCV: 105.2 fL — ABNORMAL HIGH (ref 80.0–100.0)
Monocytes Absolute: 0.6 10*3/uL (ref 0.1–1.0)
Monocytes Relative: 8 %
Neutro Abs: 6.7 10*3/uL (ref 1.7–7.7)
Neutrophils Relative %: 83 %
Platelet Count: 216 10*3/uL (ref 150–400)
RBC: 3.24 MIL/uL — ABNORMAL LOW (ref 3.87–5.11)
RDW: 18.5 % — ABNORMAL HIGH (ref 11.5–15.5)
WBC Count: 8 10*3/uL (ref 4.0–10.5)
nRBC: 0 % (ref 0.0–0.2)

## 2023-05-22 LAB — CMP (CANCER CENTER ONLY)
ALT: 10 U/L (ref 0–44)
AST: 23 U/L (ref 15–41)
Albumin: 3.6 g/dL (ref 3.5–5.0)
Alkaline Phosphatase: 69 U/L (ref 38–126)
Anion gap: 8 (ref 5–15)
BUN: 12 mg/dL (ref 6–20)
CO2: 36 mmol/L — ABNORMAL HIGH (ref 22–32)
Calcium: 10.5 mg/dL — ABNORMAL HIGH (ref 8.9–10.3)
Chloride: 99 mmol/L (ref 98–111)
Creatinine: 1.3 mg/dL — ABNORMAL HIGH (ref 0.44–1.00)
GFR, Estimated: 48 mL/min — ABNORMAL LOW (ref >60)
Glucose, Bld: 109 mg/dL — ABNORMAL HIGH (ref 70–99)
Potassium: 3.3 mmol/L — ABNORMAL LOW (ref 3.5–5.1)
Sodium: 143 mmol/L (ref 135–145)
Total Bilirubin: 0.7 mg/dL (ref 0.0–1.2)
Total Protein: 6.9 g/dL (ref 6.5–8.1)

## 2023-05-22 LAB — SAMPLE TO BLOOD BANK

## 2023-05-22 MED ORDER — GADOBUTROL 1 MMOL/ML IV SOLN
10.0000 mL | Freq: Once | INTRAVENOUS | Status: AC | PRN
  Administered 2023-05-22: 10 mL via INTRAVENOUS

## 2023-05-25 ENCOUNTER — Encounter: Payer: Self-pay | Admitting: General Practice

## 2023-05-25 NOTE — Progress Notes (Signed)
[facility] Spiritual Care Note  Left follow-up voicemail, encouraging return call.   [address] [place], [place], [facility] Pager [phone] Voicemail [phone]

## 2023-05-28 ENCOUNTER — Encounter (INDEPENDENT_AMBULATORY_CARE_PROVIDER_SITE_OTHER): Payer: Self-pay | Admitting: Family Medicine

## 2023-05-28 ENCOUNTER — Ambulatory Visit (INDEPENDENT_AMBULATORY_CARE_PROVIDER_SITE_OTHER): Payer: BC Managed Care – PPO | Admitting: Family Medicine

## 2023-05-28 VITALS — BP 180/84 | HR 84 | Temp 98.4°F | Ht 62.0 in | Wt 277.0 lb

## 2023-05-28 DIAGNOSIS — Z6841 Body Mass Index (BMI) 40.0 and over, adult: Secondary | ICD-10-CM

## 2023-05-28 DIAGNOSIS — I10 Essential (primary) hypertension: Secondary | ICD-10-CM | POA: Diagnosis not present

## 2023-05-28 DIAGNOSIS — E669 Obesity, unspecified: Secondary | ICD-10-CM

## 2023-05-28 DIAGNOSIS — C3492 Malignant neoplasm of unspecified part of left bronchus or lung: Secondary | ICD-10-CM

## 2023-05-28 NOTE — Assessment & Plan Note (Addendum)
Reviewed patient's recent CT scan with her and viewed MRI today.  She mentions that she was told that most of her things on her scans and labs look stable.  Mentally she feels as though she is in a good place and is taking things day by day trying to live life.  She is not experiencing much in terms of adverse effects from the chemotherapy.  She has started feeling more tired.  She is also feeling more weak in the legs.  She will follow-up with her oncologist for further treatment.

## 2023-05-28 NOTE — Progress Notes (Signed)
   SUBJECTIVE:  Chief Complaint: Obesity  Interim History: Patient stayed local for the holidays- she did not go visit her mother in law due to her brother in law being ill with an unknown illness.  She stayed home and watched television.  She is getting a bit more of an appetite.  She has needed 3 units blood transfusion over the holiday.  She is waiting to get off work to eat due to not knowing how she will tolerate what she eats.  Recently underwent repeat CT scan and MRI to assess if any progression of her cancer.  Madison Palmer is here to discuss her progress with her obesity treatment plan. She is on the practicing portion control and making smarter food choices, such as increasing vegetables and decreasing simple carbohydrates and states she is following her eating plan approximately 50 % of the time. She states she is walking more.   OBJECTIVE: Visit Diagnoses: Problem List Items Addressed This Visit       Cardiovascular and Mediastinum   Essential hypertension - Primary   Patient realizes upon arriving at clinic and getting vitals taken she had forgotten her medication.  Prior BP well controlled on current regimen.  Will follow up on BP at next appointment.  Given recent anemia am surprised her BP was elevated.  No change in medications at this time.        Respiratory   Adenocarcinoma of left lung, stage 4 (HCC)   Reviewed patient's recent CT scan with her and viewed MRI today.  She mentions that she was told that most of her things on her scans and labs look stable.  Mentally she feels as though she is in a good place and is taking things day by day trying to live life.  She is not experiencing much in terms of adverse effects from the chemotherapy.  She has started feeling more tired.  She is also feeling more weak in the legs.  She will follow-up with her oncologist for further treatment.      Other Visit Diagnoses       Obesity with starting BMI of 61.2         BMI 50.0-59.9,  adult (HCC)           No data recorded  No data recorded  No data recorded  No data recorded    ASSESSMENT AND PLAN:  Diet: Madison Palmer is currently in the action stage of change. As such, her goal is to going to be mindful of food intake and focus on nutrition during her chemo.  Exercise: Madison Palmer has been instructed to stay mobile and focus on muscle engagement to not lose her mobility.    Behavior Modification:  We discussed the following Behavioral Modification Strategies today: increasing lean protein intake, increase H2O intake, and no skipping meals.   No follow-ups on file.Marland Kitchen She was informed of the importance of frequent follow up visits to maximize her success with intensive lifestyle modifications for her multiple health conditions.  Attestation Statements:   Reviewed by clinician on day of visit: allergies, medications, problem list, medical history, surgical history, family history, social history, and previous encounter notes.   Time spent on visit including pre-visit chart review and post-visit care and charting was 32 minutes.    Reuben Likes, MD

## 2023-05-28 NOTE — Assessment & Plan Note (Signed)
Patient realizes upon arriving at clinic and getting vitals taken she had forgotten her medication.  Prior BP well controlled on current regimen.  Will follow up on BP at next appointment.  Given recent anemia am surprised her BP was elevated.  No change in medications at this time.

## 2023-05-29 ENCOUNTER — Inpatient Hospital Stay: Payer: BC Managed Care – PPO

## 2023-05-29 ENCOUNTER — Other Ambulatory Visit: Payer: Self-pay | Admitting: Physician Assistant

## 2023-05-29 ENCOUNTER — Inpatient Hospital Stay (HOSPITAL_BASED_OUTPATIENT_CLINIC_OR_DEPARTMENT_OTHER): Payer: BC Managed Care – PPO | Admitting: Internal Medicine

## 2023-05-29 VITALS — BP 139/91 | HR 79 | Temp 97.3°F | Resp 16 | Ht 62.0 in | Wt 281.5 lb

## 2023-05-29 DIAGNOSIS — C3492 Malignant neoplasm of unspecified part of left bronchus or lung: Secondary | ICD-10-CM | POA: Diagnosis not present

## 2023-05-29 DIAGNOSIS — E876 Hypokalemia: Secondary | ICD-10-CM

## 2023-05-29 DIAGNOSIS — Z5112 Encounter for antineoplastic immunotherapy: Secondary | ICD-10-CM | POA: Diagnosis not present

## 2023-05-29 LAB — CBC WITH DIFFERENTIAL (CANCER CENTER ONLY)
Abs Immature Granulocytes: 0.08 10*3/uL — ABNORMAL HIGH (ref 0.00–0.07)
Basophils Absolute: 0 10*3/uL (ref 0.0–0.1)
Basophils Relative: 0 %
Eosinophils Absolute: 0 10*3/uL (ref 0.0–0.5)
Eosinophils Relative: 0 %
HCT: 35 % — ABNORMAL LOW (ref 36.0–46.0)
Hemoglobin: 11.4 g/dL — ABNORMAL LOW (ref 12.0–15.0)
Immature Granulocytes: 1 %
Lymphocytes Relative: 8 %
Lymphs Abs: 0.6 10*3/uL — ABNORMAL LOW (ref 0.7–4.0)
MCH: 34.1 pg — ABNORMAL HIGH (ref 26.0–34.0)
MCHC: 32.6 g/dL (ref 30.0–36.0)
MCV: 104.8 fL — ABNORMAL HIGH (ref 80.0–100.0)
Monocytes Absolute: 0.6 10*3/uL (ref 0.1–1.0)
Monocytes Relative: 7 %
Neutro Abs: 6.4 10*3/uL (ref 1.7–7.7)
Neutrophils Relative %: 84 %
Platelet Count: 201 10*3/uL (ref 150–400)
RBC: 3.34 MIL/uL — ABNORMAL LOW (ref 3.87–5.11)
RDW: 17.6 % — ABNORMAL HIGH (ref 11.5–15.5)
WBC Count: 7.7 10*3/uL (ref 4.0–10.5)
nRBC: 0 % (ref 0.0–0.2)

## 2023-05-29 LAB — CMP (CANCER CENTER ONLY)
ALT: 13 U/L (ref 0–44)
AST: 24 U/L (ref 15–41)
Albumin: 3.5 g/dL (ref 3.5–5.0)
Alkaline Phosphatase: 74 U/L (ref 38–126)
Anion gap: 5 (ref 5–15)
BUN: 12 mg/dL (ref 6–20)
CO2: 38 mmol/L — ABNORMAL HIGH (ref 22–32)
Calcium: 10.6 mg/dL — ABNORMAL HIGH (ref 8.9–10.3)
Chloride: 100 mmol/L (ref 98–111)
Creatinine: 1.19 mg/dL — ABNORMAL HIGH (ref 0.44–1.00)
GFR, Estimated: 53 mL/min — ABNORMAL LOW (ref >60)
Glucose, Bld: 134 mg/dL — ABNORMAL HIGH (ref 70–99)
Potassium: 3.2 mmol/L — ABNORMAL LOW (ref 3.5–5.1)
Sodium: 143 mmol/L (ref 135–145)
Total Bilirubin: 0.7 mg/dL (ref 0.0–1.2)
Total Protein: 6.8 g/dL (ref 6.5–8.1)

## 2023-05-29 LAB — TSH: TSH: 1.637 u[IU]/mL (ref 0.350–4.500)

## 2023-05-29 MED ORDER — HEPARIN SOD (PORK) LOCK FLUSH 100 UNIT/ML IV SOLN: 500.0000 [IU] | Freq: Once | INTRAVENOUS | Status: DC | PRN

## 2023-05-29 MED ORDER — CYANOCOBALAMIN 1000 MCG/ML IJ SOLN
1000.0000 ug | Freq: Once | INTRAMUSCULAR | Status: AC
  Administered 2023-05-29: 1000 ug via INTRAMUSCULAR
  Filled 2023-05-29: qty 1

## 2023-05-29 MED ORDER — SODIUM CHLORIDE 0.9 % IV SOLN: Freq: Once | INTRAVENOUS | Status: AC

## 2023-05-29 MED ORDER — SODIUM CHLORIDE 0.9% FLUSH: 10.0000 mL | INTRAVENOUS | Status: DC | PRN

## 2023-05-29 MED ORDER — SODIUM CHLORIDE 0.9 % IV SOLN
200.0000 mg | Freq: Once | INTRAVENOUS | Status: AC
  Administered 2023-05-29: 200 mg via INTRAVENOUS
  Filled 2023-05-29: qty 200

## 2023-05-29 MED ORDER — SODIUM CHLORIDE 0.9 % IV SOLN
400.0000 mg/m2 | Freq: Once | INTRAVENOUS | Status: AC
  Administered 2023-05-29: 1000 mg via INTRAVENOUS
  Filled 2023-05-29: qty 40

## 2023-05-29 MED ORDER — PROCHLORPERAZINE MALEATE 10 MG PO TABS
10.0000 mg | ORAL_TABLET | Freq: Once | ORAL | Status: AC
  Administered 2023-05-29: 10 mg via ORAL
  Filled 2023-05-29: qty 1

## 2023-05-29 NOTE — Patient Instructions (Signed)
[facility] - A DEPT OF [facility]  Discharge Instructions: Thank you for choosing [facility] to provide your oncology and hematology care.   If you have a lab appointment with the Cancer Center, please go directly to the Cancer Center and check in at the registration area.   Wear comfortable clothing and clothing appropriate for easy access to any Portacath or PICC line.   We strive to give you quality time with your provider. You may need to reschedule your appointment if you arrive late (15 or more minutes).  Arriving late affects you and other patients whose appointments are after yours.  Also, if you miss three or more appointments without notifying the office, you may be dismissed from the clinic at the provider's discretion.      For prescription refill requests, have your pharmacy contact our office and allow 72 hours for refills to be completed.    Today you received the following chemotherapy and/or immunotherapy agents: Keytruda, Alimta.       To help prevent nausea and vomiting after your treatment, we encourage you to take your nausea medication as directed.  BELOW ARE SYMPTOMS THAT SHOULD BE REPORTED IMMEDIATELY: *FEVER GREATER THAN 100.4 F (38 C) OR HIGHER *CHILLS OR SWEATING *NAUSEA AND VOMITING THAT IS NOT CONTROLLED WITH YOUR NAUSEA MEDICATION *UNUSUAL SHORTNESS OF BREATH *UNUSUAL BRUISING OR BLEEDING *URINARY PROBLEMS (pain or burning when urinating, or frequent urination) *BOWEL PROBLEMS (unusual diarrhea, constipation, pain near the anus) TENDERNESS IN MOUTH AND THROAT WITH OR WITHOUT PRESENCE OF ULCERS (sore throat, sores in mouth, or a toothache) UNUSUAL RASH, SWELLING OR PAIN  UNUSUAL VAGINAL DISCHARGE OR ITCHING   Items with * indicate a potential emergency and should be followed up as soon as possible or go to the Emergency Department if any problems should occur.  Please show the CHEMOTHERAPY ALERT CARD or  IMMUNOTHERAPY ALERT CARD at check-in to the Emergency Department and triage nurse.  Should you have questions after your visit or need to cancel or reschedule your appointment, please contact [facility] - A DEPT OF [facility]  Dept: [phone]  and follow the prompts.  Office hours are 8:00 a.m. to 4:30 p.m. Monday - Friday. Please note that voicemails left after 4:00 p.m. may not be returned until the following business day.  We are closed weekends and major holidays. You have access to a nurse at all times for urgent questions. Please call the main number to the clinic Dept: [phone] and follow the prompts.   For any non-urgent questions, you may also contact your provider using MyChart. We now offer e-Visits for anyone 61 and older to request care online for non-urgent symptoms. For details visit [email or web address].   Also download the MyChart app! Go to the app store, search "MyChart", open the app, select , and log in with your MyChart username and password.

## 2023-05-29 NOTE — Progress Notes (Signed)
[facility] [phone]   [phone]  OFFICE PROGRESS NOTE  [name], [name], NP [address] [place] [address]   DIAGNOSIS: Stage IV non-small cell lung cancer, adenocarcinoma (T2a, N3, M1c).  She presented with bilateral lung nodules, thoracic adenopathy and bilobar liver lesions.  She was diagnosed in May 2024.     PDL1: 0   Guardant 360: Negative for any actionable mutations   Foundation One: No actionable mutations    PRIOR THERAPY: None   CURRENT THERAPY: Palliative systemic chemotherapy and immunotherapy with carboplatin for an AUC of 5, alimta 500 mg/m2, and Keytruda 200 mg IV every 3 weeks. First dose on 10/10/22. Status post 6 cycles.  Starting from cycle #5 the patient is on maintenance treatment with Alimta and Keytruda every 3 weeks  INTERVAL HISTORY: Madison Palmer 58 y.o. female returns to clinic today for follow-up visit accompanied by her son. Discussed the use of AI scribe software for clinical note transcription with the patient, who gave verbal consent to proceed.  History of Present Illness   The patient is a 58 year old female with stage four non-small cell lung cancer who presents with weakness in the right leg.  In the past three weeks, she has experienced increasing weakness in her right leg, leading to difficulties such as being unable to elevate herself out of the bathtub without assistance. The weakness is predominantly on the right side and has remained consistent without improvement. She uses a scooter at work and notices the weakness intermittently.  She was diagnosed with stage four non-small cell lung cancer, adenocarcinoma, in May 2024. Initially, she underwent four cycles of chemotherapy with carboplatin, Alimta, and Keytruda. Following this, she continued with Alimta and Keytruda every three weeks and has completed a total of eleven cycles so far.  Her nutrition has been stable, although she  has experienced some weight loss, losing two and a half pounds since her last visit. She is under the care of a weight loss doctor and is monitoring her weight closely.         MEDICAL HISTORY: Past Medical History:  Diagnosis Date   Acute on chronic respiratory failure with hypoxia and hypercapnia (HCC) 09/15/2018   Anemia    Anxiety    Arrhythmia    tachycardia   Arthritis    Bipolar 1 disorder (HCC) 09/12/2018   Bipolar I disorder, current or most recent episode manic, with psychotic features (HCC)    Brief psychotic disorder (HCC) 08/22/2018   Chronic respiratory failure with hypoxia (HCC) 05/10/2018   Formatting of this note might be different from the original. Last Assessment & Plan:  Continue 1 L continuous on exertion and during sleep. On her next visit we will check OSA on CPAP/room air to decide whether she needs to take oxygen along with her on her cruise in May   Depression    Diverticulitis    Generalized edema 03/12/2018   GERD (gastroesophageal reflux disease)    Glaucoma    Hyperlipidemia    Hyperparathyroidism (HCC)    Hypertension    Inflammatory polyps of colon (HCC)    Leg swelling 01/14/2019   LVH (left ventricular hypertrophy)    Lymphedema    Morbid (severe) obesity due to excess calories (HCC) 05/11/2017   PCOS (polycystic ovarian syndrome)    Peripheral vascular disease (HCC)    Prediabetes    Recurrent UTI    Sleep apnea  CPAP   Thyroid disease    Vitamin D deficiency     ALLERGIES:  is allergic to other, fentanyl, levofloxacin, midazolam, pollen extract, atorvastatin, hydralazine hcl, and rosuvastatin.  MEDICATIONS:  Current Outpatient Medications  Medication Sig Dispense Refill   acetaminophen (TYLENOL) 500 MG tablet Take 1,000 mg by mouth 3 (three) times daily as needed for moderate pain or headache.     amLODipine (NORVASC) 10 MG tablet TAKE 1 TABLET BY MOUTH EVERY DAY 90 tablet 3   atorvastatin (LIPITOR) 20 MG tablet TAKE 1 TABLET(20  MG) BY MOUTH 3 TIMES A WEEK 12 tablet 2   benztropine (COGENTIN) 0.5 MG tablet Take 1 tablet (0.5 mg total) by mouth at bedtime. 30 tablet 2   cetirizine (ZYRTEC) 10 MG tablet TAKE 1 TABLET BY MOUTH EVERY DAY 90 tablet 1   Cholecalciferol (VITAMIN D3 MAXIMUM STRENGTH) 125 MCG (5000 UT) capsule Take 5,000 Units by mouth daily.     cinacalcet (SENSIPAR) 30 MG tablet Take 30 mg by mouth daily.     cloNIDine (CATAPRES) 0.1 MG tablet TAKE 1 TABLET(0.1 MG) BY MOUTH THREE TIMES DAILY 90 tablet 3   esomeprazole (NEXIUM) 40 MG capsule TAKE 1 CAPSULE BY MOUTH TWICE DAILY 180 capsule 2   fenofibrate (TRICOR) 145 MG tablet TAKE 1 TABLET(145 MG) BY MOUTH DAILY 90 tablet 3   folic acid (FOLVITE) 1 MG tablet TAKE 1 TABLET BY MOUTH DAILY 30 tablet 2   furosemide (LASIX) 40 MG tablet TAKE 1 TABLET BY MOUTH EVERY DAY 90 tablet 3   gabapentin (NEURONTIN) 100 MG capsule TAKE 2 CAPSULES(200 MG) BY MOUTH AT BEDTIME 180 capsule 3   labetalol (NORMODYNE) 300 MG tablet TAKE 1 AND 1/2 TABLETS BY MOUTH TWICE DAILY 270 tablet 0   levothyroxine (SYNTHROID) 88 MCG tablet Take 88 mcg by mouth daily.     lisinopril (ZESTRIL) 40 MG tablet TAKE 1 TABLET BY MOUTH EVERY DAY 90 tablet 1   lurasidone (LATUDA) 40 MG TABS tablet Take 1 tablet (40 mg total) by mouth daily with breakfast. 30 tablet 2   Polyethylene Glycol 3350 (MIRALAX PO) Take 1 Capful by mouth daily as needed (Constipation).     Potassium Chloride ER 20 MEQ TBCR TAKE 1 TABLET BY MOUTH EVERY DAY 90 tablet 3   prochlorperazine (COMPAZINE) 10 MG tablet Take 1 tablet (10 mg total) by mouth every 6 (six) hours as needed. 30 tablet 2   TART CHERRY PO Take 1 tablet by mouth daily.     No current facility-administered medications for this visit.    SURGICAL HISTORY:  Past Surgical History:  Procedure Laterality Date   BIOPSY  09/27/2022   Procedure: BIOPSY;  Surgeon: Mansouraty, Netty Starring., MD;  Location: [facility] ENDOSCOPY;  Service: Gastroenterology;;   BREAST BIOPSY  2015    CESAREAN SECTION  2004   CHOLECYSTECTOMY     COLONOSCOPY WITH PROPOFOL N/A 09/27/2022   Procedure: COLONOSCOPY WITH PROPOFOL;  Surgeon: Lemar Lofty., MD;  Location[facility] ENDOSCOPY;  Service: Gastroenterology;  Laterality: N/A;   ESOPHAGOGASTRODUODENOSCOPY (EGD) WITH PROPOFOL N/A 09/27/2022   Procedure: ESOPHAGOGASTRODUODENOSCOPY (EGD) WITH PROPOFOL;  Surgeon: Meridee Score Netty Starring., MD;  Location: [facility] ENDOSCOPY;  Service: Gastroenterology;  Laterality: N/A;   INNER EAR SURGERY     ear and sinus surgery   LAPAROSCOPIC REPAIR AND REMOVAL OF GASTRIC BAND     MALONEY DILATION  09/27/2022   Procedure: MALONEY DILATION;  Surgeon: Lemar Lofty., MD;  Location: [facility] ENDOSCOPY;  Service: Gastroenterology;;  OOPHORECTOMY Left    POLYPECTOMY  09/27/2022   Procedure: POLYPECTOMY;  Surgeon: Mansouraty, Netty Starring., MD;  Location: [facility] ENDOSCOPY;  Service: Gastroenterology;;   TONSILLECTOMY AND ADENOIDECTOMY      REVIEW OF SYSTEMS:  Constitutional: positive for fatigue and weight loss Eyes: negative Ears, nose, mouth, throat, and face: negative Respiratory: positive for dyspnea on exertion Cardiovascular: negative Gastrointestinal: negative Genitourinary:negative Integument/breast: negative Hematologic/lymphatic: negative Musculoskeletal:positive for muscle weakness Neurological: negative Behavioral/Psych: negative Endocrine: negative Allergic/Immunologic: negative   PHYSICAL EXAMINATION: General appearance: alert, cooperative, fatigued, and no distress Head: Normocephalic, without obvious abnormality, atraumatic Neck: no adenopathy, no JVD, supple, symmetrical, trachea midline, and thyroid not enlarged, symmetric, no tenderness/mass/nodules Lymph nodes: Cervical, supraclavicular, and axillary nodes normal. Resp: clear to auscultation bilaterally Back: symmetric, no curvature. ROM normal. No CVA tenderness. Cardio: regular rate and rhythm, S1, S2 normal, no murmur, click, rub  or gallop GI: soft, non-tender; bowel sounds normal; no masses,  no organomegaly Extremities: edema 1+ and small area of erythema in the right leg. Neurologic: Alert and oriented X 3, normal strength and tone. Normal symmetric reflexes. Normal coordination and gait  ECOG PERFORMANCE STATUS: 1 - Symptomatic but completely ambulatory  Blood pressure (!) 139/91, pulse 79, temperature (!) 97.3 F (36.3 C), temperature source Temporal, resp. rate 16, height 5\' 2"  (1.575 m), weight 281 lb 8 oz (127.7 kg), last menstrual period 05/02/2017, SpO2 94%.  LABORATORY DATA: Lab Results  Component Value Date   WBC 7.7 05/29/2023   HGB 11.4 (L) 05/29/2023   HCT 35.0 (L) 05/29/2023   MCV 104.8 (H) 05/29/2023   PLT 201 05/29/2023      Chemistry      Component Value Date/Time   NA 143 05/22/2023 1403   NA 145 09/08/2021 0000   K 3.3 (L) 05/22/2023 1403   CL 99 05/22/2023 1403   CO2 36 (H) 05/22/2023 1403   BUN 12 05/22/2023 1403   BUN 36 (H) 08/22/2021 1606   CREATININE 1.30 (H) 05/22/2023 1403   CREATININE 1.13 (H) 12/24/2018 1402   GLU 97 09/08/2021 0000      Component Value Date/Time   CALCIUM 10.5 (H) 05/22/2023 1403   ALKPHOS 69 05/22/2023 1403   AST 23 05/22/2023 1403   ALT 10 05/22/2023 1403   BILITOT 0.7 05/22/2023 1403       RADIOGRAPHIC STUDIES: CT CHEST ABDOMEN PELVIS WO CONTRAST Result Date: 05/24/2023 CLINICAL DATA:  Non-small-cell lung cancer. Assess treatment response. * Tracking Code: BO * EXAM: CT CHEST, ABDOMEN AND PELVIS WITHOUT CONTRAST TECHNIQUE: Multidetector CT imaging of the chest, abdomen and pelvis was performed following the standard protocol without IV contrast. RADIATION DOSE REDUCTION: This exam was performed according to the departmental dose-optimization program which includes automated exposure control, adjustment of the mA and/or kV according to patient size and/or use of iterative reconstruction technique. COMPARISON:  PET-CT scan 03/15/2023.  Standard CT  02/06/2023. FINDINGS: CT CHEST FINDINGS Cardiovascular: Evaluation significantly limited without the advantage of IV contrast. Grossly the heart is nonenlarged. No significant pericardial effusion. Coronary artery calcifications are seen. Please correlate for other coronary risk factors. The thoracic aorta has normal course and caliber with some atherosclerotic plaque. Mediastinum/Nodes: Normal caliber thoracic esophagus. Small thyroid gland. No specific abnormal lymph node enlargement identified in the axillary regions, right hilum. Evaluation is limited without IV contrast. There are some mediastinal nodes identified once again. Example previously measured subcarinal at 18 mm and today short axis of 15 mm on series 2, image 21. Left hilar  node which previously had measured 16 mm, today more difficult to measure without IV contrast but estimated at 17 mm. Series 2, image 21. No new mediastinal nodal enlargement. Lungs/Pleura: Increased small left pleural effusion. No right-sided effusion. No pneumothorax. There is fluid tracking along the left interlobar fissure as well. Numerous bilateral lung nodules are identified, diffusely distributed. The distribution appears similar to previous examination. Many of the smaller lesions appear similar in size. Specific lesions be followed for continuity. This includes the dominant focus medial at the left lung base which previously had AP dimension of 4.1 cm and today 4.1 cm on series 4, image 73. The right lower lobe central lesion which measured 12 mm on the previous examination, today on series 4, image 54 measures 12 mm. Musculoskeletal: Scattered degenerative changes of the spine. Some curvature. CT ABDOMEN PELVIS FINDINGS Hepatobiliary: Extensive liver metastases are once again identified. Again evaluation lesions is limited without the advantage of IV contrast. Specifically which will be attempted to be remeasured today for continuity. Lesion seen left lobe lateral  segment measuring 2.5 cm on the previous, today is estimated at 2.4 cm on series 2, image 41. Lesion seen in segment 4 previously measuring 4.6 cm, today is estimated at 4.9 cm on series 2, image 50. Overall liver lesions appear relatively similar in extent distribution when adjusted for technique. Subtle changes are difficult to define with the differences in technique liver has a nodular contour. Pancreas: Unremarkable. No pancreatic ductal dilatation or surrounding inflammatory changes. Spleen: Normal in size without focal abnormality. Adrenals/Urinary Tract: Stable adrenal nodularity. No obstructing renal or ureteral stones. Nonspecific perinephric stranding. No collecting system dilatation. Preserved contour to the urinary bladder. Stomach/Bowel: On this non oral contrast exam large bowel has a normal course and caliber. Scattered colonic diverticula and stool. No small bowel dilatation. Normal appendix in the right lower quadrant. Vascular/Lymphatic: Aortic atherosclerosis. No enlarged abdominal or pelvic lymph nodes. Reproductive: Uterus is present. There is slight enlargement of the right ovary compared to left, nonspecific. Unchanged from previous. Other: Mild ascites identified today comment new from previous. This includes along the margin of the liver and in the dependent pelvis. Increasing mesenteric stranding Musculoskeletal: Moderate degenerative changes of the spine and pelvis. IMPRESSION: Evaluation is limited without IV contrast. Specific lesions are difficult to measure and directly compared to the previous contrast examinations. 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 There is also stable mediastinal and left hilar nodes overall. Difficulty specifically true liver lesions. If needed additional workup may be of some benefit of the liver including potentially MRI if CT is not an option with contrast However increasing small left pleural  effusion. Developing mild ascites. Please correlate for any clinical evidence of pseudo cirrhosis with nodular contour to the liver as well. Electronically Signed   By: Karen Kays M.D.   On: 05/24/2023 16:25   MM 3D SCREENING MAMMOGRAM BILATERAL BREAST Result Date: 05/16/2023 CLINICAL DATA:  Screening. EXAM: DIGITAL SCREENING BILATERAL MAMMOGRAM WITH TOMOSYNTHESIS AND CAD TECHNIQUE: Bilateral screening digital craniocaudal and mediolateral oblique mammograms were obtained. Bilateral screening digital breast tomosynthesis was performed. The images were evaluated with computer-aided detection. COMPARISON:  Previous exam(s). ACR Breast Density Category b: There are scattered areas of fibroglandular density. FINDINGS: There are no findings suspicious for malignancy. IMPRESSION: No mammographic evidence of malignancy. A result letter of this screening mammogram will be mailed directly to the patient. RECOMMENDATION: Screening mammogram in one year. (Code:SM-B-01Y) BI-RADS CATEGORY  1:  Negative. Electronically Signed   By: Hulan Saas M.D.   On: 05/16/2023 15:16    ASSESSMENT AND PLAN: This is a very pleasant 58 years old African-American female with stage IV non-small cell lung cancer, adenocarcinoma (T2a, N3, M1c).  She presented with bilateral lung nodules, thoracic adenopathy and bilobar liver lesions.  She was diagnosed in May 2024.   She has no actionable mutations and PD-L1 expression was negative. The patient is currently undergoing systemic chemoimmunotherapy with carboplatin for AUC 5, Alimta 500 Mg/M2 and Keytruda 200 Mg IV every 3 weeks status post 11 cycles.  Starting from cycle #5 she is on maintenance treatment with Alimta and Keytruda every 3 weeks.  She has been tolerating this treatment fairly well. She had repeat CT scan of the chest, abdomen and pelvis performed recently.  I personally and independently reviewed the scan and discussed the result with the patient and her son.  Her scan  showed no clear evidence for disease progression. Assessment and Plan    Stage IV Non-Small Cell Lung Cancer (NSCLC), Adenocarcinoma Diagnosed in May 2024. Currently undergoing chemotherapy with carboplatin, Alimta, and Keytruda. Completed four cycles of carboplatin, Alimta, and Keytruda, followed by maintenance therapy with Alimta and Keytruda every three weeks. Total of eleven cycles completed. Recent imaging shows stable disease. Hemoglobin improved to 11.4, no transfusion needed. Reports persistent right-sided leg weakness. Awaiting MRI brain results to rule out metastasis or other neurological causes. Discussed the importance of continuing current treatment regimen and potential side effects, including fatigue and weakness. - Administer cycle twelve of Alimta and Keytruda today - Review MRI brain results when available - Monitor hemoglobin levels - Schedule follow-up in three weeks - Advise to report any new symptoms or concerns  Right-Sided Leg Weakness Progressive right-sided leg weakness affecting daily activities. Weakness stable over the past three weeks. MRI brain ordered to investigate potential causes, including metastasis. Discussed possible neurological involvement and need for further evaluation based on MRI results. - Review MRI brain results - Consider further neurological evaluation if MRI is inconclusive  General Health Maintenance Consistent weight management under care of a weight loss doctor. Recent weight loss of 2.5 pounds, within acceptable limits. Discussed importance of maintaining nutritional status during chemotherapy. - Monitor weight and nutritional status - Encourage balanced diet and regular follow-ups with weight loss doctor  Follow-up - Schedule follow-up appointment in three weeks - Review MRI brain results when available - Advise to report any new symptoms or concerns immediately.   The patient was advised to call immediately if she has any other  concerning symptoms in the interval.  The patient voices understanding of current disease status and treatment options and is in agreement with the current care plan.  All questions were answered. The patient knows to call the clinic with any problems, questions or concerns. We can certainly see the patient much sooner if necessary.  The total time spent in the appointment was 30 minutes.  Disclaimer: This note was dictated with voice recognition software. Similar sounding words can inadvertently be transcribed and may not be corrected upon review.

## 2023-05-30 LAB — T4: T4, Total: 11.8 ug/dL (ref 4.5–12.0)

## 2023-06-05 ENCOUNTER — Other Ambulatory Visit: Payer: Self-pay

## 2023-06-05 ENCOUNTER — Telehealth: Payer: Self-pay | Admitting: *Deleted

## 2023-06-05 ENCOUNTER — Inpatient Hospital Stay: Payer: BC Managed Care – PPO

## 2023-06-05 ENCOUNTER — Inpatient Hospital Stay (HOSPITAL_COMMUNITY)
Admission: EM | Admit: 2023-06-05 | Discharge: 2023-06-15 | DRG: 555 | Disposition: A | Discharge status: Skilled Nursing Facility | Payer: BC Managed Care – PPO | Attending: Internal Medicine | Admitting: Internal Medicine

## 2023-06-05 ENCOUNTER — Encounter (HOSPITAL_COMMUNITY): Payer: Self-pay

## 2023-06-05 ENCOUNTER — Emergency Department (HOSPITAL_COMMUNITY): Payer: BC Managed Care – PPO

## 2023-06-05 DIAGNOSIS — D539 Nutritional anemia, unspecified: Secondary | ICD-10-CM | POA: Insufficient documentation

## 2023-06-05 DIAGNOSIS — E66813 Obesity, class 3: Secondary | ICD-10-CM

## 2023-06-05 DIAGNOSIS — J9601 Acute respiratory failure with hypoxia: Secondary | ICD-10-CM | POA: Diagnosis present

## 2023-06-05 DIAGNOSIS — G9341 Metabolic encephalopathy: Secondary | ICD-10-CM | POA: Diagnosis not present

## 2023-06-05 DIAGNOSIS — J9811 Atelectasis: Secondary | ICD-10-CM | POA: Diagnosis present

## 2023-06-05 DIAGNOSIS — Z79890 Hormone replacement therapy (postmenopausal): Secondary | ICD-10-CM

## 2023-06-05 DIAGNOSIS — C7951 Secondary malignant neoplasm of bone: Secondary | ICD-10-CM | POA: Diagnosis present

## 2023-06-05 DIAGNOSIS — R350 Frequency of micturition: Secondary | ICD-10-CM | POA: Diagnosis present

## 2023-06-05 DIAGNOSIS — E039 Hypothyroidism, unspecified: Secondary | ICD-10-CM | POA: Diagnosis present

## 2023-06-05 DIAGNOSIS — M793 Panniculitis, unspecified: Secondary | ICD-10-CM | POA: Diagnosis present

## 2023-06-05 DIAGNOSIS — G629 Polyneuropathy, unspecified: Secondary | ICD-10-CM

## 2023-06-05 DIAGNOSIS — I5032 Chronic diastolic (congestive) heart failure: Secondary | ICD-10-CM | POA: Diagnosis present

## 2023-06-05 DIAGNOSIS — N1831 Chronic kidney disease, stage 3a: Secondary | ICD-10-CM | POA: Diagnosis present

## 2023-06-05 DIAGNOSIS — E559 Vitamin D deficiency, unspecified: Secondary | ICD-10-CM | POA: Diagnosis present

## 2023-06-05 DIAGNOSIS — Z881 Allergy status to other antibiotic agents status: Secondary | ICD-10-CM

## 2023-06-05 DIAGNOSIS — Z6841 Body Mass Index (BMI) 40.0 and over, adult: Secondary | ICD-10-CM

## 2023-06-05 DIAGNOSIS — E785 Hyperlipidemia, unspecified: Secondary | ICD-10-CM | POA: Diagnosis present

## 2023-06-05 DIAGNOSIS — M47816 Spondylosis without myelopathy or radiculopathy, lumbar region: Secondary | ICD-10-CM | POA: Diagnosis present

## 2023-06-05 DIAGNOSIS — C787 Secondary malignant neoplasm of liver and intrahepatic bile duct: Secondary | ICD-10-CM | POA: Diagnosis present

## 2023-06-05 DIAGNOSIS — F319 Bipolar disorder, unspecified: Secondary | ICD-10-CM | POA: Diagnosis present

## 2023-06-05 DIAGNOSIS — H409 Unspecified glaucoma: Secondary | ICD-10-CM | POA: Diagnosis present

## 2023-06-05 DIAGNOSIS — I130 Hypertensive heart and chronic kidney disease with heart failure and stage 1 through stage 4 chronic kidney disease, or unspecified chronic kidney disease: Secondary | ICD-10-CM | POA: Diagnosis present

## 2023-06-05 DIAGNOSIS — I10 Essential (primary) hypertension: Secondary | ICD-10-CM | POA: Diagnosis present

## 2023-06-05 DIAGNOSIS — R531 Weakness: Secondary | ICD-10-CM | POA: Diagnosis not present

## 2023-06-05 DIAGNOSIS — Z888 Allergy status to other drugs, medicaments and biological substances status: Secondary | ICD-10-CM

## 2023-06-05 DIAGNOSIS — M6281 Muscle weakness (generalized): Principal | ICD-10-CM | POA: Diagnosis present

## 2023-06-05 DIAGNOSIS — Z87440 Personal history of urinary (tract) infections: Secondary | ICD-10-CM

## 2023-06-05 DIAGNOSIS — M5126 Other intervertebral disc displacement, lumbar region: Secondary | ICD-10-CM | POA: Diagnosis present

## 2023-06-05 DIAGNOSIS — I5033 Acute on chronic diastolic (congestive) heart failure: Secondary | ICD-10-CM | POA: Diagnosis present

## 2023-06-05 DIAGNOSIS — Z9981 Dependence on supplemental oxygen: Secondary | ICD-10-CM

## 2023-06-05 DIAGNOSIS — D61810 Antineoplastic chemotherapy induced pancytopenia: Secondary | ICD-10-CM | POA: Diagnosis present

## 2023-06-05 DIAGNOSIS — T451X5A Adverse effect of antineoplastic and immunosuppressive drugs, initial encounter: Secondary | ICD-10-CM | POA: Diagnosis present

## 2023-06-05 DIAGNOSIS — Z9049 Acquired absence of other specified parts of digestive tract: Secondary | ICD-10-CM

## 2023-06-05 DIAGNOSIS — D709 Neutropenia, unspecified: Secondary | ICD-10-CM | POA: Insufficient documentation

## 2023-06-05 DIAGNOSIS — E876 Hypokalemia: Secondary | ICD-10-CM | POA: Diagnosis present

## 2023-06-05 DIAGNOSIS — R29898 Other symptoms and signs involving the musculoskeletal system: Secondary | ICD-10-CM

## 2023-06-05 DIAGNOSIS — G4733 Obstructive sleep apnea (adult) (pediatric): Secondary | ICD-10-CM | POA: Diagnosis present

## 2023-06-05 DIAGNOSIS — E282 Polycystic ovarian syndrome: Secondary | ICD-10-CM | POA: Diagnosis present

## 2023-06-05 DIAGNOSIS — C3492 Malignant neoplasm of unspecified part of left bronchus or lung: Secondary | ICD-10-CM | POA: Diagnosis present

## 2023-06-05 DIAGNOSIS — J910 Malignant pleural effusion: Secondary | ICD-10-CM | POA: Diagnosis present

## 2023-06-05 DIAGNOSIS — D708 Other neutropenia: Secondary | ICD-10-CM

## 2023-06-05 DIAGNOSIS — Z79899 Other long term (current) drug therapy: Secondary | ICD-10-CM

## 2023-06-05 DIAGNOSIS — K219 Gastro-esophageal reflux disease without esophagitis: Secondary | ICD-10-CM | POA: Diagnosis present

## 2023-06-05 DIAGNOSIS — I739 Peripheral vascular disease, unspecified: Secondary | ICD-10-CM | POA: Diagnosis present

## 2023-06-05 DIAGNOSIS — L03311 Cellulitis of abdominal wall: Secondary | ICD-10-CM | POA: Diagnosis present

## 2023-06-05 DIAGNOSIS — Z860100 Personal history of colon polyps, unspecified: Secondary | ICD-10-CM

## 2023-06-05 DIAGNOSIS — F419 Anxiety disorder, unspecified: Secondary | ICD-10-CM | POA: Diagnosis present

## 2023-06-05 DIAGNOSIS — D696 Thrombocytopenia, unspecified: Secondary | ICD-10-CM | POA: Insufficient documentation

## 2023-06-05 DIAGNOSIS — Z885 Allergy status to narcotic agent status: Secondary | ICD-10-CM

## 2023-06-05 LAB — DIFFERENTIAL
Abs Immature Granulocytes: 0.17 10*3/uL — ABNORMAL HIGH (ref 0.00–0.07)
Basophils Absolute: 0 10*3/uL (ref 0.0–0.1)
Basophils Relative: 0 %
Eosinophils Absolute: 0 10*3/uL (ref 0.0–0.5)
Eosinophils Relative: 2 %
Immature Granulocytes: 17 %
Lymphocytes Relative: 39 %
Lymphs Abs: 0.4 10*3/uL — ABNORMAL LOW (ref 0.7–4.0)
Monocytes Absolute: 0 10*3/uL — ABNORMAL LOW (ref 0.1–1.0)
Monocytes Relative: 4 %
Neutro Abs: 0.4 10*3/uL — CL (ref 1.7–7.7)
Neutrophils Relative %: 38 %

## 2023-06-05 LAB — CBC
HCT: 32.5 % — ABNORMAL LOW (ref 36.0–46.0)
Hemoglobin: 10.5 g/dL — ABNORMAL LOW (ref 12.0–15.0)
MCH: 34.4 pg — ABNORMAL HIGH (ref 26.0–34.0)
MCHC: 32.3 g/dL (ref 30.0–36.0)
MCV: 106.6 fL — ABNORMAL HIGH (ref 80.0–100.0)
Platelets: 109 10*3/uL — ABNORMAL LOW (ref 150–400)
RBC: 3.05 MIL/uL — ABNORMAL LOW (ref 3.87–5.11)
RDW: 17 % — ABNORMAL HIGH (ref 11.5–15.5)
WBC: 1 10*3/uL — CL (ref 4.0–10.5)
nRBC: 0 % (ref 0.0–0.2)

## 2023-06-05 LAB — BASIC METABOLIC PANEL
Anion gap: 12 (ref 5–15)
BUN: 17 mg/dL (ref 6–20)
CO2: 33 mmol/L — ABNORMAL HIGH (ref 22–32)
Calcium: 10.1 mg/dL (ref 8.9–10.3)
Chloride: 96 mmol/L — ABNORMAL LOW (ref 98–111)
Creatinine, Ser: 0.98 mg/dL (ref 0.44–1.00)
GFR, Estimated: >60 mL/min (ref >60)
Glucose, Bld: 116 mg/dL — ABNORMAL HIGH (ref 70–99)
Potassium: 3.2 mmol/L — ABNORMAL LOW (ref 3.5–5.1)
Sodium: 141 mmol/L (ref 135–145)

## 2023-06-05 MED ORDER — FILGRASTIM-AAFI 480 MCG/0.8ML IJ SOSY: 480.0000 ug | PREFILLED_SYRINGE | Freq: Once | INTRAMUSCULAR | Status: DC

## 2023-06-05 MED ORDER — GADOBUTROL 1 MMOL/ML IV SOLN
10.0000 mL | Freq: Once | INTRAVENOUS | Status: AC | PRN
  Administered 2023-06-05: 10 mL via INTRAVENOUS

## 2023-06-05 MED ORDER — POTASSIUM CHLORIDE CRYS ER 20 MEQ PO TBCR
40.0000 meq | EXTENDED_RELEASE_TABLET | Freq: Once | ORAL | Status: AC
  Administered 2023-06-05: 40 meq via ORAL
  Filled 2023-06-05: qty 2

## 2023-06-05 MED ORDER — TBO-FILGRASTIM 480 MCG/0.8ML ~~LOC~~ SOSY
480.0000 ug | PREFILLED_SYRINGE | Freq: Once | SUBCUTANEOUS | Status: AC
  Administered 2023-06-05: 480 ug via SUBCUTANEOUS
  Filled 2023-06-05: qty 0.8

## 2023-06-05 NOTE — ED Triage Notes (Signed)
Lung cancer, gets chemo every 3 weeks. Had chemo last week. Pt has had increasing weakness over the last few days and states she is too weak to walk now. Oncologist told pt to come to ER to have her legs checked.

## 2023-06-05 NOTE — ED Provider Triage Note (Signed)
 Emergency Medicine Provider Triage Evaluation Note  Madison Palmer , a 58 y.o. female  was evaluated in triage.  Pt complains of bilateral intermittent LE weakness and pain x 2-3 months, but became acutely unable to push up in her scooter yesterday. Sx have since improved.  Hx of Lung and liver cancer.  Last chemo Tuesday of last week.  CT and MRI done 05/22/2023  Endorses urinary frequency  Denies fevers, chest pain, shortness of breath, back pain, vomiting, diarrhea  numbness, tingling, dysuria, LE pain current. Denies fall, trauma.   Review of Systems  Positive: N/a Negative: N/a  Physical Exam  BP (!) 134/95 (BP Location: Left Arm)   Pulse (!) 120   Temp 98.5 F (36.9 C) (Oral)   Resp 17   Ht 5' 2 (1.575 m)   Wt 127.6 kg   LMP 05/02/2017   SpO2 90%   BMI 51.45 kg/m  Gen:   Awake, no distress   Resp:  Normal effort  MSK:   Moves extremities without difficulty  Other:    Medical Decision Making  Medically screening exam initiated at 4:34 PM.  Appropriate orders placed.  SURAYA VIDRINE was informed that the remainder of the evaluation will be completed by another provider, this initial triage assessment does not replace that evaluation, and the importance of remaining in the ED until their evaluation is complete.     Beola Terrall RAMAN, [place] 06/05/23 1640

## 2023-06-05 NOTE — ED Notes (Signed)
Pt requesting MRI results and to be discharged. Informed admitting MD, who has not seen the patient yet, who requested EDP update pt on results and admission. Dr.Rancour and family updated

## 2023-06-05 NOTE — Telephone Encounter (Signed)
 Patient called with concerns that she is now unable to lift herself in and out of her scooter. Her weakness has progressed and she is having difficulty being able to move herself around for ADL's. Discussed decline with Dr. Sherrod and he advised patient to go to ED for possible scans.   Patient called and advised to report to ED. Patient agrees with plan.

## 2023-06-05 NOTE — ED Provider Notes (Signed)
 [name] EMERGENCY DEPARTMENT AT [facility] Provider Note   CSN: [id number] Arrival date & time: 06/05/23  1510     History  Chief Complaint  Patient presents with   Weakness    Madison Palmer is a 58 y.o. female.  Patient has a history of lung cancer with mets to the liver.  She also has hypertension and obesity.  She has been having weakness in her legs for 2 years but the last couple days the weakness has gotten worse.  And she cannot walk.  Patient had chemotherapy a week ago.  The history is provided by the patient and medical records. No language interpreter was used.  Weakness Severity:  Moderate Onset quality:  Gradual Timing:  Constant Progression:  Worsening Chronicity:  Recurrent Context: not alcohol  use   Relieved by:  None tried Worsened by:  Nothing Ineffective treatments:  None tried Associated symptoms: no abdominal pain, no chest pain, no cough, no diarrhea, no frequency, no headaches and no seizures        Home Medications Prior to Admission medications   Medication Sig Start Date End Date Taking? Authorizing Provider  acetaminophen  (TYLENOL ) 500 MG tablet Take 1,000 mg by mouth 3 (three) times daily as needed for moderate pain or headache.    [provider]  amLODipine  (NORVASC ) 10 MG tablet TAKE 1 TABLET BY MOUTH EVERY DAY 04/10/23   Lavona Agent, MD  atorvastatin  (LIPITOR) 20 MG tablet TAKE 1 TABLET(20 MG) BY MOUTH 3 TIMES A WEEK 04/17/23   [name], Roselie Rockford, NP  benztropine  (COGENTIN ) 0.5 MG tablet Take 1 tablet (0.5 mg total) by mouth at bedtime. 04/10/23 07/09/23  Arfeen, Leni DASEN, MD  cetirizine  (ZYRTEC ) 10 MG tablet TAKE 1 TABLET BY MOUTH EVERY DAY 12/21/22   [name], Roselie Rockford, NP  Cholecalciferol  (VITAMIN D3 MAXIMUM STRENGTH) 125 MCG (5000 UT) capsule Take 5,000 Units by mouth daily.    [provider]  cinacalcet (SENSIPAR) 30 MG tablet Take 30 mg by mouth daily.    [provider]  cloNIDine   (CATAPRES ) 0.1 MG tablet TAKE 1 TABLET(0.1 MG) BY MOUTH THREE TIMES DAILY 11/29/22   Lavona Agent, MD  esomeprazole  (NEXIUM ) 40 MG capsule TAKE 1 CAPSULE BY MOUTH TWICE DAILY 01/30/23   Mansouraty, Gabriel Jr., MD  fenofibrate  (TRICOR ) 145 MG tablet TAKE 1 TABLET(145 MG) BY MOUTH DAILY 12/12/22   [name], Roselie Rockford, NP  folic acid  (FOLVITE ) 1 MG tablet TAKE 1 TABLET BY MOUTH DAILY 04/03/23   Heilingoetter, Cassandra L, PA-C  furosemide  (LASIX ) 40 MG tablet TAKE 1 TABLET BY MOUTH EVERY DAY 04/27/23   Lavona Agent, MD  gabapentin  (NEURONTIN ) 100 MG capsule TAKE 2 CAPSULES(200 MG) BY MOUTH AT BEDTIME 07/18/22   [name], Roselie Rockford, NP  labetalol  (NORMODYNE ) 300 MG tablet TAKE 1 AND 1/2 TABLETS BY MOUTH TWICE DAILY 03/21/23   Lavona Agent, MD  levothyroxine  (SYNTHROID ) 88 MCG tablet Take 88 mcg by mouth daily. 07/28/21   [provider]  lisinopril  (ZESTRIL ) 40 MG tablet TAKE 1 TABLET BY MOUTH EVERY DAY 12/26/22   Lavona Agent, MD  lurasidone  (LATUDA ) 40 MG TABS tablet Take 1 tablet (40 mg total) by mouth daily with breakfast. 04/10/23   Arfeen, Leni DASEN, MD  Polyethylene Glycol 3350  (MIRALAX  PO) Take 1 Capful by mouth daily as needed (Constipation).    [provider]  Potassium Chloride  ER 20 MEQ TBCR TAKE 1 TABLET BY MOUTH EVERY DAY 05/08/23   Lavona Agent, MD  prochlorperazine  (  COMPAZINE ) 10 MG tablet Take 1 tablet (10 mg total) by mouth every 6 (six) hours as needed. 09/28/22   Heilingoetter, Cassandra L, PA-C  TART CHERRY PO Take 1 tablet by mouth daily.    [provider]      Allergies    Other, Fentanyl , Levofloxacin, Midazolam , Pollen extract, Atorvastatin , Hydralazine  hcl, and Rosuvastatin    Review of Systems   Review of Systems  Constitutional:  Negative for appetite change and fatigue.  HENT:  Negative for congestion, ear discharge and sinus pressure.   Eyes:  Negative for discharge.  Respiratory:  Negative for cough.   Cardiovascular:  Negative for  chest pain.  Gastrointestinal:  Negative for abdominal pain and diarrhea.  Genitourinary:  Negative for frequency and hematuria.  Musculoskeletal:  Negative for back pain.  Skin:  Negative for rash.  Neurological:  Positive for weakness. Negative for seizures and headaches.  Psychiatric/Behavioral:  Negative for hallucinations.     Physical Exam Updated Vital Signs BP (!) 169/97 (BP Location: Left Arm)   Pulse 61   Temp 98.4 F (36.9 C) (Oral)   Resp 16   Ht 5' 2 (1.575 m)   Wt 127.6 kg   LMP 05/02/2017   SpO2 98%   BMI 51.45 kg/m  Physical Exam Vitals and nursing note reviewed.  Constitutional:      Appearance: She is well-developed.  HENT:     Head: Normocephalic.     Nose: Nose normal.  Eyes:     General: No scleral icterus.    Conjunctiva/sclera: Conjunctivae normal.  Neck:     Thyroid : No thyromegaly.  Cardiovascular:     Rate and Rhythm: Normal rate and regular rhythm.     Heart sounds: No murmur heard.    No friction rub. No gallop.  Pulmonary:     Breath sounds: No stridor. No wheezing or rales.  Chest:     Chest wall: No tenderness.  Abdominal:     General: There is no distension.     Tenderness: There is no abdominal tenderness. There is no rebound.  Musculoskeletal:     Cervical back: Neck supple.     Comments: Patient have profound weakness that in both lower legs.  She can raise them off the bed and hold it just for few seconds and only can lift her legs 5 to 6 inches off the bed  Lymphadenopathy:     Cervical: No cervical adenopathy.  Skin:    Findings: No erythema or rash.  Neurological:     Mental Status: She is alert and oriented to person, place, and time.     Motor: No abnormal muscle tone.     Coordination: Coordination normal.  Psychiatric:        Behavior: Behavior normal.     ED Results / Procedures / Treatments   Labs (all labs ordered are listed, but only abnormal results are displayed) Labs Reviewed  BASIC METABOLIC PANEL -  Abnormal; Notable for the following components:      Result Value   Potassium 3.2 (*)    Chloride 96 (*)    CO2 33 (*)    Glucose, Bld 116 (*)    All other components within normal limits  CBC - Abnormal; Notable for the following components:   WBC 1.0 (*)    RBC 3.05 (*)    Hemoglobin 10.5 (*)    HCT 32.5 (*)    MCV 106.6 (*)    MCH 34.4 (*)  RDW 17.0 (*)    Platelets 109 (*)    All other components within normal limits  DIFFERENTIAL - Abnormal; Notable for the following components:   Neutro Abs 0.4 (*)    Lymphs Abs 0.4 (*)    Monocytes Absolute 0.0 (*)    Abs Immature Granulocytes 0.17 (*)    All other components within normal limits  URINALYSIS, ROUTINE W REFLEX MICROSCOPIC  CBG MONITORING, ED    EKG EKG Interpretation Date/Time:  Tuesday June 05 2023 16:27:18 EST Ventricular Rate:  120 PR Interval:  149 QRS Duration:  84 QT Interval:  312 QTC Calculation: 441 R Axis:   32  Text Interpretation: Sinus tachycardia LAE, consider biatrial enlargement Anterior infarct, old Confirmed by Suzette Pac [id number]) on 06/05/2023 10:49:48 PM  Radiology MR Lumbar Spine W Wo Contrast Result Date: 06/05/2023 CLINICAL DATA:  Initial evaluation for acute myelopathy. History of metastatic non-small cell lung cancer. EXAM: MRI LUMBAR SPINE WITHOUT AND WITH CONTRAST TECHNIQUE: Multiplanar and multiecho pulse sequences of the lumbar spine were obtained without and with intravenous contrast. CONTRAST:  10mL GADAVIST  GADOBUTROL  1 MMOL/ML IV SOLN COMPARISON:  Comparison made with prior CT from 05/22/2023 and PET-CT from 03/15/2023 FINDINGS: Segmentation: Standard. Lowest well-formed disc space labeled the L5-S1 level. Alignment: 3 mm degenerative retrolisthesis of L5 on S1, with trace 2 mm anterolisthesis of L4 on L5. Findings chronic and facet mediated. Alignment otherwise normal preservation of the normal lumbar lordosis. Vertebrae: Vertebral body height maintained without acute or chronic  fracture. Bone marrow signal intensity within normal limits. 7 mm lesion present within the S1 segment, with additional 8 mm lesion within the left sacral ala (series 11, images 9, 14). These lesions demonstrate hypointense precontrast T1 signal intensity, STIR hyperintensity, with postcontrast enhancement. Given history of metastatic non-small cell lung cancer, findings are concerning for small osseous metastases. No other discrete or worrisome osseous lesions. No extra osseous or epidural tumor. Conus medullaris and cauda equina: Conus extends to the L2 level. Conus and cauda equina appear normal. No abnormal enhancement. Paraspinal and other soft tissues: Paraspinous soft tissues demonstrate no acute finding. Innumerable hepatic metastases noted within the visualized liver. Disc levels: L1-2: Normal interspace. Mild bilateral facet hypertrophy. No spinal stenosis. Foramina remain patent. L2-3: Disc desiccation with diffuse disc bulge. Superimposed right foraminal to extraforaminal disc protrusion contacts the exiting right L2 nerve root (series 9, image 17). Moderate right with mild-to-moderate left facet hypertrophy. Resultant mild canal with moderate right lateral recess stenosis. Mild to moderate right L2 foraminal narrowing. Left neural foramina remains patent. L3-4: Disc desiccation without significant disc bulge. Moderate facet and ligament flavum hypertrophy. No significant spinal stenosis. Foramina remain patent. L4-5: Disc desiccation without significant disc bulge. Moderate to severe bilateral facet arthrosis. Resultant mild canal with bilateral subarticular stenosis. Mild to moderate bilateral L4 foraminal stenosis. L5-S1: Degenerative vertebral disc space narrowing with disc desiccation and diffuse disc bulge. Reactive endplate spurring. Small central annular fissure. Mild facet hypertrophy. No significant spinal stenosis. Moderate bilateral L5 foraminal narrowing. IMPRESSION: 1. No acute abnormality  within the lumbar spine. No high-grade spinal stenosis or evidence for cord compression. 2. 7 mm lesion within the S1 segment, with additional 8 mm lesion within the left sacral ala. Given history of metastatic non-small cell lung cancer, findings are concerning for small osseous metastases. No extra osseous or epidural tumor. 3. Right foraminal to extraforaminal disc protrusion at L2-3, potentially affecting the exiting right L2 nerve root. 4. Moderate to advanced lower lumbar facet  hypertrophy, which could contribute to lower back pain. 5. Innumerable hepatic metastases within the visualized liver. Electronically Signed   By: Morene Hoard M.D.   On: 06/05/2023 22:43    Procedures Procedures    Medications Ordered in ED Medications  potassium chloride  SA (KLOR-CON  M) CR tablet 40 mEq (has no administration in time range)  Tbo-Filgrastim  (GRANIX ) injection 480 mcg (480 mcg Subcutaneous Given 06/05/23 1917)  gadobutrol  (GADAVIST ) 1 MMOL/ML injection 10 mL (10 mLs Intravenous Contrast Given 06/05/23 2217)    ED Course/ Medical Decision Making/ A&P                                 Medical Decision Making Amount and/or Complexity of Data Reviewed Labs: ordered. Radiology: ordered.  Risk Prescription drug management. Decision regarding hospitalization.  This patient presents to the ED for concern of lower extremity weakness, this involves an extensive number of treatment options, and is a complaint that carries with it a high risk of complications and morbidity.  The differential diagnosis includes chemotherapy related, metastatic disease to the lumbar spine   Co morbidities that complicate the patient evaluation  Metastatic lung cancer   Additional history obtained:  Additional history obtained from Dr. Gatha External records from outside source obtained and reviewed including hospital records   Lab Tests:  I Ordered, and personally interpreted labs.  The pertinent  results include: Absolute neutrophil count 0.4   Imaging Studies ordered:  I ordered imaging studies including MRI lumbar spine I independently visualized and interpreted imaging which showed no acute significant disease I agree with the radiologist interpretation   Cardiac Monitoring: / EKG:  The patient was maintained on a cardiac monitor.  I personally viewed and interpreted the cardiac monitored which showed an underlying rhythm of: Normal sinus rhythm   Consultations Obtained:  I requested consultation with the hospitalist oncologist,  and discussed lab and imaging findings as well as pertinent plan - they recommend: Hospitalist admit and oncologist consulting   Problem List / ED Course / Critical interventions / Medication management  Metastatic lung cancer and leg weakness I ordered medication including normal saline for dehydration Reevaluation of the patient after these medicines showed that the patient stayed the same I have reviewed the patients home medicines and have made adjustments as needed   Social Determinants of Health:  None   Test / Admission - Considered:  None  Neutropenia, metastatic lung cancer and lower extremity weakness.  She will be admitted to medicine with oncology consulting        Final Clinical Impression(s) / ED Diagnoses Final diagnoses:  None    Rx / DC Orders ED Discharge Orders     None         Suzette Pac, MD 06/06/23 [phone]

## 2023-06-06 ENCOUNTER — Observation Stay (HOSPITAL_COMMUNITY): Payer: BC Managed Care – PPO

## 2023-06-06 ENCOUNTER — Encounter (HOSPITAL_COMMUNITY): Payer: Self-pay | Admitting: Internal Medicine

## 2023-06-06 DIAGNOSIS — T451X5A Adverse effect of antineoplastic and immunosuppressive drugs, initial encounter: Secondary | ICD-10-CM | POA: Diagnosis not present

## 2023-06-06 DIAGNOSIS — E785 Hyperlipidemia, unspecified: Secondary | ICD-10-CM | POA: Diagnosis present

## 2023-06-06 DIAGNOSIS — F419 Anxiety disorder, unspecified: Secondary | ICD-10-CM | POA: Diagnosis present

## 2023-06-06 DIAGNOSIS — N1831 Chronic kidney disease, stage 3a: Secondary | ICD-10-CM | POA: Diagnosis present

## 2023-06-06 DIAGNOSIS — I739 Peripheral vascular disease, unspecified: Secondary | ICD-10-CM | POA: Diagnosis present

## 2023-06-06 DIAGNOSIS — R29898 Other symptoms and signs involving the musculoskeletal system: Secondary | ICD-10-CM

## 2023-06-06 DIAGNOSIS — L03311 Cellulitis of abdominal wall: Secondary | ICD-10-CM | POA: Diagnosis present

## 2023-06-06 DIAGNOSIS — D709 Neutropenia, unspecified: Secondary | ICD-10-CM | POA: Insufficient documentation

## 2023-06-06 DIAGNOSIS — C3492 Malignant neoplasm of unspecified part of left bronchus or lung: Secondary | ICD-10-CM | POA: Diagnosis present

## 2023-06-06 DIAGNOSIS — D701 Agranulocytosis secondary to cancer chemotherapy: Secondary | ICD-10-CM | POA: Diagnosis not present

## 2023-06-06 DIAGNOSIS — D696 Thrombocytopenia, unspecified: Secondary | ICD-10-CM | POA: Insufficient documentation

## 2023-06-06 DIAGNOSIS — G4733 Obstructive sleep apnea (adult) (pediatric): Secondary | ICD-10-CM | POA: Diagnosis present

## 2023-06-06 DIAGNOSIS — F319 Bipolar disorder, unspecified: Secondary | ICD-10-CM | POA: Diagnosis present

## 2023-06-06 DIAGNOSIS — R531 Weakness: Secondary | ICD-10-CM

## 2023-06-06 DIAGNOSIS — M6281 Muscle weakness (generalized): Secondary | ICD-10-CM | POA: Diagnosis present

## 2023-06-06 DIAGNOSIS — E039 Hypothyroidism, unspecified: Secondary | ICD-10-CM | POA: Diagnosis present

## 2023-06-06 DIAGNOSIS — J9811 Atelectasis: Secondary | ICD-10-CM | POA: Diagnosis present

## 2023-06-06 DIAGNOSIS — D539 Nutritional anemia, unspecified: Secondary | ICD-10-CM | POA: Insufficient documentation

## 2023-06-06 DIAGNOSIS — J9601 Acute respiratory failure with hypoxia: Secondary | ICD-10-CM | POA: Diagnosis present

## 2023-06-06 DIAGNOSIS — I5032 Chronic diastolic (congestive) heart failure: Secondary | ICD-10-CM | POA: Diagnosis present

## 2023-06-06 DIAGNOSIS — C7951 Secondary malignant neoplasm of bone: Secondary | ICD-10-CM | POA: Diagnosis present

## 2023-06-06 DIAGNOSIS — I130 Hypertensive heart and chronic kidney disease with heart failure and stage 1 through stage 4 chronic kidney disease, or unspecified chronic kidney disease: Secondary | ICD-10-CM | POA: Diagnosis present

## 2023-06-06 DIAGNOSIS — G629 Polyneuropathy, unspecified: Secondary | ICD-10-CM | POA: Diagnosis present

## 2023-06-06 DIAGNOSIS — J910 Malignant pleural effusion: Secondary | ICD-10-CM | POA: Diagnosis present

## 2023-06-06 DIAGNOSIS — Z6841 Body Mass Index (BMI) 40.0 and over, adult: Secondary | ICD-10-CM | POA: Diagnosis not present

## 2023-06-06 DIAGNOSIS — G9341 Metabolic encephalopathy: Secondary | ICD-10-CM | POA: Diagnosis not present

## 2023-06-06 DIAGNOSIS — E876 Hypokalemia: Secondary | ICD-10-CM | POA: Diagnosis present

## 2023-06-06 DIAGNOSIS — D61810 Antineoplastic chemotherapy induced pancytopenia: Secondary | ICD-10-CM | POA: Diagnosis present

## 2023-06-06 DIAGNOSIS — C787 Secondary malignant neoplasm of liver and intrahepatic bile duct: Secondary | ICD-10-CM | POA: Diagnosis present

## 2023-06-06 DIAGNOSIS — R350 Frequency of micturition: Secondary | ICD-10-CM | POA: Diagnosis present

## 2023-06-06 LAB — CBC WITH DIFFERENTIAL/PLATELET
Abs Immature Granulocytes: 0.16 10*3/uL — ABNORMAL HIGH (ref 0.00–0.07)
Basophils Absolute: 0 10*3/uL (ref 0.0–0.1)
Basophils Relative: 0 %
Eosinophils Absolute: 0 10*3/uL (ref 0.0–0.5)
Eosinophils Relative: 1 %
HCT: 29.8 % — ABNORMAL LOW (ref 36.0–46.0)
Hemoglobin: 9.5 g/dL — ABNORMAL LOW (ref 12.0–15.0)
Immature Granulocytes: 22 %
Lymphocytes Relative: 45 %
Lymphs Abs: 0.3 10*3/uL — ABNORMAL LOW (ref 0.7–4.0)
MCH: 34.5 pg — ABNORMAL HIGH (ref 26.0–34.0)
MCHC: 31.9 g/dL (ref 30.0–36.0)
MCV: 108.4 fL — ABNORMAL HIGH (ref 80.0–100.0)
Monocytes Absolute: 0 10*3/uL — ABNORMAL LOW (ref 0.1–1.0)
Monocytes Relative: 4 %
Neutro Abs: 0.2 10*3/uL — CL (ref 1.7–7.7)
Neutrophils Relative %: 28 %
Platelets: 83 10*3/uL — ABNORMAL LOW (ref 150–400)
RBC: 2.75 MIL/uL — ABNORMAL LOW (ref 3.87–5.11)
RDW: 17.2 % — ABNORMAL HIGH (ref 11.5–15.5)
WBC: 0.7 10*3/uL — CL (ref 4.0–10.5)
nRBC: 0 % (ref 0.0–0.2)

## 2023-06-06 LAB — URINALYSIS, ROUTINE W REFLEX MICROSCOPIC
Bacteria, UA: NONE SEEN
Bilirubin Urine: NEGATIVE
Glucose, UA: NEGATIVE mg/dL
Ketones, ur: NEGATIVE mg/dL
Leukocytes,Ua: NEGATIVE
Nitrite: NEGATIVE
Protein, ur: NEGATIVE mg/dL
Specific Gravity, Urine: 1.011 (ref 1.005–1.030)
pH: 6 (ref 5.0–8.0)

## 2023-06-06 LAB — FOLATE: Folate: 18 ng/mL (ref >5.9)

## 2023-06-06 LAB — IRON AND TIBC
Iron: 210 ug/dL — ABNORMAL HIGH (ref 28–170)
Saturation Ratios: 81 % — ABNORMAL HIGH (ref 10.4–31.8)
TIBC: 259 ug/dL (ref 250–450)
UIBC: 49 ug/dL

## 2023-06-06 LAB — COMPREHENSIVE METABOLIC PANEL
ALT: 33 U/L (ref 0–44)
AST: 39 U/L (ref 15–41)
Albumin: 2.7 g/dL — ABNORMAL LOW (ref 3.5–5.0)
Alkaline Phosphatase: 60 U/L (ref 38–126)
Anion gap: 11 (ref 5–15)
BUN: 21 mg/dL — ABNORMAL HIGH (ref 6–20)
CO2: 34 mmol/L — ABNORMAL HIGH (ref 22–32)
Calcium: 9.9 mg/dL (ref 8.9–10.3)
Chloride: 95 mmol/L — ABNORMAL LOW (ref 98–111)
Creatinine, Ser: 1.02 mg/dL — ABNORMAL HIGH (ref 0.44–1.00)
GFR, Estimated: >60 mL/min (ref >60)
Glucose, Bld: 122 mg/dL — ABNORMAL HIGH (ref 70–99)
Potassium: 3.7 mmol/L (ref 3.5–5.1)
Sodium: 140 mmol/L (ref 135–145)
Total Bilirubin: 2.1 mg/dL — ABNORMAL HIGH (ref 0.0–1.2)
Total Protein: 6.5 g/dL (ref 6.5–8.1)

## 2023-06-06 LAB — MAGNESIUM: Magnesium: 2 mg/dL (ref 1.7–2.4)

## 2023-06-06 LAB — FERRITIN: Ferritin: 1468 ng/mL — ABNORMAL HIGH (ref 11–307)

## 2023-06-06 LAB — HIV ANTIBODY (ROUTINE TESTING W REFLEX): HIV Screen 4th Generation wRfx: NONREACTIVE

## 2023-06-06 LAB — RETICULOCYTES
RBC.: 2.81 MIL/uL — ABNORMAL LOW (ref 3.87–5.11)
Retic Ct Pct: <0.4 % — ABNORMAL LOW (ref 0.4–3.1)

## 2023-06-06 LAB — TSH: TSH: 0.769 u[IU]/mL (ref 0.350–4.500)

## 2023-06-06 LAB — MRSA NEXT GEN BY PCR, NASAL: MRSA by PCR Next Gen: NOT DETECTED

## 2023-06-06 LAB — VITAMIN B12: Vitamin B-12: 1358 pg/mL — ABNORMAL HIGH (ref 180–914)

## 2023-06-06 LAB — CK: Total CK: 66 U/L (ref 38–234)

## 2023-06-06 MED ORDER — VITAMIN D 25 MCG (1000 UNIT) PO TABS
5000.0000 [IU] | ORAL_TABLET | Freq: Every day | ORAL | Status: DC
  Administered 2023-06-06 – 2023-06-15 (×10): 5000 [IU] via ORAL
  Filled 2023-06-06 (×10): qty 5

## 2023-06-06 MED ORDER — VANCOMYCIN HCL 1500 MG/300ML IV SOLN
1500.0000 mg | Freq: Every day | INTRAVENOUS | Status: DC
  Administered 2023-06-06 – 2023-06-07 (×2): 1500 mg via INTRAVENOUS
  Filled 2023-06-06 (×2): qty 300

## 2023-06-06 MED ORDER — LABETALOL HCL 300 MG PO TABS
450.0000 mg | ORAL_TABLET | Freq: Two times a day (BID) | ORAL | Status: DC
  Administered 2023-06-06 – 2023-06-15 (×18): 450 mg via ORAL
  Filled 2023-06-06: qty 5
  Filled 2023-06-06 (×7): qty 1.5
  Filled 2023-06-06: qty 5
  Filled 2023-06-06 (×11): qty 1.5

## 2023-06-06 MED ORDER — TBO-FILGRASTIM 480 MCG/0.8ML ~~LOC~~ SOSY
480.0000 ug | PREFILLED_SYRINGE | Freq: Once | SUBCUTANEOUS | Status: AC
Start: 2023-06-06 — End: 2023-06-06
  Administered 2023-06-06: 480 ug via SUBCUTANEOUS
  Filled 2023-06-06: qty 0.8

## 2023-06-06 MED ORDER — FOLIC ACID 1 MG PO TABS
1.0000 mg | ORAL_TABLET | Freq: Every day | ORAL | Status: DC
  Administered 2023-06-06 – 2023-06-10 (×5): 1 mg via ORAL
  Filled 2023-06-06 (×5): qty 1

## 2023-06-06 MED ORDER — MELATONIN 5 MG PO TABS
5.0000 mg | ORAL_TABLET | Freq: Once | ORAL | Status: AC
  Administered 2023-06-06: 5 mg via ORAL
  Filled 2023-06-06: qty 1

## 2023-06-06 MED ORDER — HALOPERIDOL LACTATE 5 MG/ML IJ SOLN
1.0000 mg | Freq: Once | INTRAMUSCULAR | Status: AC
  Administered 2023-06-06: 1 mg via INTRAVENOUS
  Filled 2023-06-06: qty 1

## 2023-06-06 MED ORDER — POLYETHYLENE GLYCOL 3350 17 G PO PACK: 17.0000 g | PACK | Freq: Every day | ORAL | Status: DC | PRN

## 2023-06-06 MED ORDER — AMLODIPINE BESYLATE 10 MG PO TABS
10.0000 mg | ORAL_TABLET | Freq: Every day | ORAL | Status: DC
  Administered 2023-06-06 – 2023-06-15 (×9): 10 mg via ORAL
  Filled 2023-06-06 (×3): qty 1
  Filled 2023-06-06: qty 2
  Filled 2023-06-06 (×6): qty 1

## 2023-06-06 MED ORDER — GABAPENTIN 100 MG PO CAPS
200.0000 mg | ORAL_CAPSULE | Freq: Every day | ORAL | Status: DC
  Administered 2023-06-06 – 2023-06-14 (×9): 200 mg via ORAL
  Filled 2023-06-06 (×9): qty 2

## 2023-06-06 MED ORDER — LURASIDONE HCL 40 MG PO TABS
40.0000 mg | ORAL_TABLET | Freq: Every day | ORAL | Status: DC
Start: 2023-06-06 — End: 2023-06-15
  Administered 2023-06-06 – 2023-06-15 (×10): 40 mg via ORAL
  Filled 2023-06-06 (×10): qty 1

## 2023-06-06 MED ORDER — ACETAMINOPHEN 325 MG PO TABS
650.0000 mg | ORAL_TABLET | Freq: Four times a day (QID) | ORAL | Status: DC | PRN
  Administered 2023-06-11 – 2023-06-14 (×4): 650 mg via ORAL
  Filled 2023-06-06 (×5): qty 2

## 2023-06-06 MED ORDER — LEVOTHYROXINE SODIUM 88 MCG PO TABS
88.0000 ug | ORAL_TABLET | Freq: Every day | ORAL | Status: DC
  Administered 2023-06-06 – 2023-06-15 (×10): 88 ug via ORAL
  Filled 2023-06-06 (×10): qty 1

## 2023-06-06 MED ORDER — BENZTROPINE MESYLATE 0.5 MG PO TABS
0.5000 mg | ORAL_TABLET | Freq: Every day | ORAL | Status: DC
Start: 2023-06-06 — End: 2023-06-15
  Administered 2023-06-06 – 2023-06-14 (×9): 0.5 mg via ORAL
  Filled 2023-06-06 (×9): qty 1

## 2023-06-06 MED ORDER — LISINOPRIL 20 MG PO TABS
40.0000 mg | ORAL_TABLET | Freq: Every day | ORAL | Status: DC
  Administered 2023-06-06 – 2023-06-08 (×3): 40 mg via ORAL
  Filled 2023-06-06: qty 2
  Filled 2023-06-06: qty 4
  Filled 2023-06-06: qty 2

## 2023-06-06 MED ORDER — ENOXAPARIN SODIUM 40 MG/0.4ML IJ SOSY: 40.0000 mg | PREFILLED_SYRINGE | INTRAMUSCULAR | Status: DC

## 2023-06-06 MED ORDER — ATORVASTATIN CALCIUM 10 MG PO TABS
20.0000 mg | ORAL_TABLET | ORAL | Status: DC
  Administered 2023-06-06 – 2023-06-15 (×6): 20 mg via ORAL
  Filled 2023-06-06 (×7): qty 2

## 2023-06-06 MED ORDER — SODIUM CHLORIDE (PF) 0.9 % IJ SOLN
INTRAMUSCULAR | Status: AC
  Filled 2023-06-06: qty 50

## 2023-06-06 MED ORDER — PANTOPRAZOLE SODIUM 40 MG PO TBEC
40.0000 mg | DELAYED_RELEASE_TABLET | Freq: Every day | ORAL | Status: DC
  Administered 2023-06-06 – 2023-06-15 (×10): 40 mg via ORAL
  Filled 2023-06-06 (×10): qty 1

## 2023-06-06 MED ORDER — IOHEXOL 350 MG/ML SOLN
100.0000 mL | Freq: Once | INTRAVENOUS | Status: AC | PRN
  Administered 2023-06-06: 100 mL via INTRAVENOUS

## 2023-06-06 MED ORDER — SODIUM CHLORIDE 0.9 % IV SOLN
2.0000 g | Freq: Three times a day (TID) | INTRAVENOUS | Status: DC
Start: 2023-06-06 — End: 2023-06-08
  Administered 2023-06-06 – 2023-06-08 (×6): 2 g via INTRAVENOUS
  Filled 2023-06-06 (×7): qty 12.5

## 2023-06-06 MED ORDER — VANCOMYCIN HCL IN DEXTROSE 1-5 GM/200ML-% IV SOLN
1000.0000 mg | Freq: Once | INTRAVENOUS | Status: AC
  Administered 2023-06-06: 1000 mg via INTRAVENOUS
  Filled 2023-06-06: qty 200

## 2023-06-06 MED ORDER — FENOFIBRATE 160 MG PO TABS
160.0000 mg | ORAL_TABLET | Freq: Every day | ORAL | Status: DC
  Administered 2023-06-06 – 2023-06-15 (×10): 160 mg via ORAL
  Filled 2023-06-06 (×10): qty 1

## 2023-06-06 MED ORDER — POTASSIUM CHLORIDE 20 MEQ PO PACK
20.0000 meq | PACK | Freq: Once | ORAL | Status: AC
Start: 2023-06-06 — End: 2023-06-06
  Administered 2023-06-06: 20 meq via ORAL
  Filled 2023-06-06: qty 1

## 2023-06-06 MED ORDER — NYSTATIN 100000 UNIT/GM EX POWD
Freq: Three times a day (TID) | CUTANEOUS | Status: DC
  Filled 2023-06-06: qty 15

## 2023-06-06 MED ORDER — ACETAMINOPHEN 650 MG RE SUPP: 650.0000 mg | Freq: Four times a day (QID) | RECTAL | Status: DC | PRN

## 2023-06-06 NOTE — H&P (Signed)
 History and Physical    [name] FMW:[id number] DOB: February 01, 1966 DOA: 06/05/2023  Patient coming from: Home.  Chief Complaint: Lower extremity weakness.  HPI: Madison Palmer is a 58 y.o. female with history of stage IV lung cancer on chemotherapy and Keytruda  last dose was on 05/29/2023 with history of diastolic CHF, hypertension, hyperlipidemia, sleep apnea, depression, hypothyroidism has been experiencing progressive lower extremity weakness over the last 2 weeks.  Patient states that her weakness was initially on the right side and in the the last few days present on both lower extremities.  Patient recently had followed with oncologist Dr. Sherrod on 05/29/2023 when MRI of the brain was planned.  Patient did have an MRI of the brain on 05/22/2023 which did not show anything acute.  Patient denies any incontinence of urine or bowel or any Palmer pain or falls.  Patient states the weakness is that she is not able to lift her lower extremities against gravity but she is able to walk with the help of a cane.  Denies any tingling or numbness of the lower extremities.  Denies any weakness of the upper extremities.  ED Course: In the ER patient appears nonfocal.  MRI of the L-spine did not show any high-grade spinal stenosis or cord compression.  Did show some features concerning for metastatic disease.  CT angiogram of the chest was negative for PE but did show a moderate-sized left pleural effusion some of which was loculated and also evidence of metastatic disease.  Labs show mild hypokalemia macrocytic anemia and neutropenia.  ER physician discussed with Dr. Sherrod who advised to give Granix  for the neutropenia.  Patient is afebrile.  Patient admitted for further observation.  Review of Systems: As per HPI, rest all negative.   Past Medical History:  Diagnosis Date   Acute on chronic respiratory failure with hypoxia and hypercapnia (HCC) 09/15/2018   Anemia    Anxiety    Arrhythmia     tachycardia   Arthritis    Bipolar 1 disorder (HCC) 09/12/2018   Bipolar I disorder, current or most recent episode manic, with psychotic features (HCC)    Brief psychotic disorder (HCC) 08/22/2018   Chronic respiratory failure with hypoxia (HCC) 05/10/2018   Formatting of this note might be different from the original. Last Assessment & Plan:  Continue 1 L continuous on exertion and during sleep. On her next visit we will check OSA on CPAP/room air to decide whether she needs to take oxygen along with her on her cruise in May   Depression    Diverticulitis    Generalized edema 03/12/2018   GERD (gastroesophageal reflux disease)    Glaucoma    Hyperlipidemia    Hyperparathyroidism (HCC)    Hypertension    Inflammatory polyps of colon (HCC)    Leg swelling 01/14/2019   LVH (left ventricular hypertrophy)    Lymphedema    Morbid (severe) obesity due to excess calories (HCC) 05/11/2017   PCOS (polycystic ovarian syndrome)    Peripheral vascular disease (HCC)    Prediabetes    Recurrent UTI    Sleep apnea    CPAP   Thyroid  disease    Vitamin D  deficiency     Past Surgical History:  Procedure Laterality Date   BIOPSY  09/27/2022   Procedure: BIOPSY;  Surgeon: Wilhelmenia Aloha Raddle., MD;  Location: [facility] ENDOSCOPY;  Service: Gastroenterology;;   BREAST BIOPSY  2015   CESAREAN SECTION  2004   CHOLECYSTECTOMY  COLONOSCOPY WITH PROPOFOL  N/A 09/27/2022   Procedure: COLONOSCOPY WITH PROPOFOL ;  Surgeon: Wilhelmenia Aloha Raddle., MD;  Location: [facility] ENDOSCOPY;  Service: Gastroenterology;  Laterality: N/A;   ESOPHAGOGASTRODUODENOSCOPY (EGD) WITH PROPOFOL  N/A 09/27/2022   Procedure: ESOPHAGOGASTRODUODENOSCOPY (EGD) WITH PROPOFOL ;  Surgeon: Wilhelmenia Aloha Raddle., MD;  Location: [facility] ENDOSCOPY;  Service: Gastroenterology;  Laterality: N/A;   INNER EAR SURGERY     ear and sinus surgery   LAPAROSCOPIC REPAIR AND REMOVAL OF GASTRIC BAND     MALONEY DILATION  09/27/2022   Procedure: MALONEY  DILATION;  Surgeon: Wilhelmenia Aloha Raddle., MD;  Location: [facility] ENDOSCOPY;  Service: Gastroenterology;;   OOPHORECTOMY Left    POLYPECTOMY  09/27/2022   Procedure: POLYPECTOMY;  Surgeon: Wilhelmenia Aloha Raddle., MD;  Location: [facility] ENDOSCOPY;  Service: Gastroenterology;;   TONSILLECTOMY AND ADENOIDECTOMY       reports that she has never smoked. She has never used smokeless tobacco. She reports current alcohol  use. She reports that she does not use drugs.  Allergies  Allergen Reactions   Other Other (See Comments)    Hydralazine  Hcl Other reaction(s): Other (See Comments) Instructed not to take by Cardiology.   Fentanyl  Hives    Other reaction(s): hives had fentanyl  recently with benadryl , did OK   Levofloxacin Hives   Midazolam  Hives   Pollen Extract     seasonal   Atorvastatin  Other (See Comments)    Muscle pain in legs  Abdominal pain   Hydralazine  Hcl Other (See Comments)    Hypercalcemia    Rosuvastatin Other (See Comments)    Abdominal pain Other reaction(s): diarrhea    Family History  Adopted: Yes  Problem Relation Age of Onset   Hearing loss Son        right    Healthy Son    Breast cancer Neg Hx     Prior to Admission medications   Medication Sig Start Date End Date Taking? Authorizing Provider  acetaminophen  (TYLENOL ) 500 MG tablet Take 1,000 mg by mouth 3 (three) times daily as needed for moderate pain or headache.   Yes [provider]  amLODipine  (NORVASC ) 10 MG tablet TAKE 1 TABLET BY MOUTH EVERY DAY 04/10/23  Yes Hochrein, Lynwood, MD  atorvastatin  (LIPITOR) 20 MG tablet TAKE 1 TABLET(20 MG) BY MOUTH 3 TIMES A WEEK 04/17/23  Yes [name], Roselie Rockford, NP  benztropine  (COGENTIN ) 0.5 MG tablet Take 1 tablet (0.5 mg total) by mouth at bedtime. 04/10/23 07/09/23 Yes Arfeen, Leni DASEN, MD  Cholecalciferol  (VITAMIN D3 MAXIMUM STRENGTH) 125 MCG (5000 UT) capsule Take 5,000 Units by mouth daily.   Yes [provider]  esomeprazole  (NEXIUM ) 40 MG capsule  TAKE 1 CAPSULE BY MOUTH TWICE DAILY 01/30/23  Yes Mansouraty, Aloha Raddle., MD  fenofibrate  (TRICOR ) 145 MG tablet TAKE 1 TABLET(145 MG) BY MOUTH DAILY 12/12/22  Yes [name], Roselie Rockford, NP  folic acid  (FOLVITE ) 1 MG tablet TAKE 1 TABLET BY MOUTH DAILY 04/03/23  Yes Heilingoetter, Cassandra L, PA-C  furosemide  (LASIX ) 40 MG tablet TAKE 1 TABLET BY MOUTH EVERY DAY 04/27/23  Yes Lavona Lynwood, MD  gabapentin  (NEURONTIN ) 100 MG capsule TAKE 2 CAPSULES(200 MG) BY MOUTH AT BEDTIME 07/18/22  Yes [name], Roselie Rockford, NP  labetalol  (NORMODYNE ) 300 MG tablet TAKE 1 AND 1/2 TABLETS BY MOUTH TWICE DAILY 03/21/23  Yes Lavona Lynwood, MD  levothyroxine  (SYNTHROID ) 88 MCG tablet Take 88 mcg by mouth daily. 07/28/21  Yes [provider]  lisinopril  (ZESTRIL ) 40 MG tablet TAKE 1 TABLET BY MOUTH EVERY DAY  12/26/22  Yes Lavona Agent, MD  lurasidone  (LATUDA ) 40 MG TABS tablet Take 1 tablet (40 mg total) by mouth daily with breakfast. 04/10/23  Yes Arfeen, Leni DASEN, MD  Polyethylene Glycol 3350  (MIRALAX  PO) Take 1 Capful by mouth daily as needed (Constipation).   Yes [provider]  Potassium Chloride  ER 20 MEQ TBCR TAKE 1 TABLET BY MOUTH EVERY DAY 05/08/23  Yes Lavona Agent, MD  prochlorperazine  (COMPAZINE ) 10 MG tablet Take 1 tablet (10 mg total) by mouth every 6 (six) hours as needed. 09/28/22  Yes Heilingoetter, Cassandra L, PA-C  TART CHERRY PO Take 1 tablet by mouth daily.   Yes [provider]  cetirizine  (ZYRTEC ) 10 MG tablet TAKE 1 TABLET BY MOUTH EVERY DAY Patient not taking: Reported on 06/06/2023 12/21/22   [name], Roselie Rockford, NP    Physical Exam: Constitutional: Moderately built and nourished. Vitals:   06/06/23 0130 06/06/23 0315 06/06/23 0400 06/06/23 0500  BP: 132/84 131/73 122/73 121/81  Pulse: 94 93 88 88  Resp:  18 18 18   Temp:      TempSrc:      SpO2: 97% 96% 95% 94%  Weight:      Height:       Eyes: Anicteric no pallor. ENMT: No discharge from the ears eyes nose  or mouth. Neck: No mass felt.  No neck rigidity. Respiratory: No rhonchi or crepitations. Cardiovascular: S1-S2 heard. Abdomen: Soft nontender bowel sounds present. Musculoskeletal: No edema. Skin: No rash. Neurologic: Alert awake oriented to time place and person.  Moves all extremities 5 x 5.  Difficult to elicit deep tendon reflex of the lower extremities.  Has some difficulty in moving lower extremities against gravity.  Good sensation of the lower extremities. Psychiatric: Appears normal.  Normal affect.   Labs on Admission: I have personally reviewed following labs and imaging studies  CBC: Recent Labs  Lab 06/05/23 1638  WBC 1.0*  NEUTROABS 0.4*  HGB 10.5*  HCT 32.5*  MCV 106.6*  PLT 109*   Basic Metabolic Panel: Recent Labs  Lab 06/05/23 1638  NA 141  K 3.2*  CL 96*  CO2 33*  GLUCOSE 116*  BUN 17  CREATININE 0.98  CALCIUM  10.1   GFR: Estimated Creatinine Clearance: 81.1 mL/min (by C-G formula based on SCr of 0.98 mg/dL). Liver Function Tests: No results for input(s): AST, ALT, ALKPHOS, BILITOT, PROT, ALBUMIN in the last 168 hours. No results for input(s): LIPASE, AMYLASE in the last 168 hours. No results for input(s): AMMONIA in the last 168 hours. Coagulation Profile: No results for input(s): INR, PROTIME in the last 168 hours. Cardiac Enzymes: No results for input(s): CKTOTAL, CKMB, CKMBINDEX, TROPONINI in the last 168 hours. BNP (last 3 results) No results for input(s): PROBNP in the last 8760 hours. HbA1C: No results for input(s): HGBA1C in the last 72 hours. CBG: No results for input(s): GLUCAP in the last 168 hours. Lipid Profile: No results for input(s): CHOL, HDL, LDLCALC, TRIG, CHOLHDL, LDLDIRECT in the last 72 hours. Thyroid  Function Tests: No results for input(s): TSH, T4TOTAL, FREET4, T3FREE, THYROIDAB in the last 72 hours. Anemia Panel: No results for input(s): VITAMINB12,  FOLATE, FERRITIN, TIBC, IRON, RETICCTPCT in the last 72 hours. Urine analysis:    Component Value Date/Time   APPEARANCEUR Clear 03/08/2023 0000   GLUCOSEU Negative 03/08/2023 0000   BILIRUBINUR Negative 03/08/2023 0000   PROTEINUR Negative 03/08/2023 0000   UROBILINOGEN 1.0 05/21/2018 1332   NITRITE Negative 03/08/2023 0000   LEUKOCYTESUR Negative  03/08/2023 0000   Sepsis Labs: @LABRCNTIP (procalcitonin:4,lacticidven:4) )No results found for this or any previous visit (from the past 240 hours).   Radiological Exams on Admission: CT Angio Chest PE W and/or Wo Contrast Result Date: 06/06/2023 CLINICAL DATA:  Increasing weakness following chemotherapy last week. History of non-small cell lung cancer, chronic respiratory distress. Portable chest obtained prior to CT. EXAM: CT ANGIOGRAPHY CHEST WITH CONTRAST TECHNIQUE: Multidetector CT imaging of the chest was performed using the standard protocol during bolus administration of intravenous contrast. Multiplanar CT image reconstructions and MIPs were obtained to evaluate the vascular anatomy. RADIATION DOSE REDUCTION: This exam was performed according to the departmental dose-optimization program which includes automated exposure control, adjustment of the mA and/or kV according to patient size and/or use of iterative reconstruction technique. CONTRAST:  OMNIPAQUE  IOHEXOL  350 MG/ML SOLN COMPARISON:  Chest CT with contrast 05/22/2023, PET-CT 03/15/2023. FINDINGS: Cardiovascular: There is moderate left main and three-vessel coronary artery calcific plaque. Mild cardiomegaly with a left chamber predominance and mild aortic atherosclerosis. There is no aortic or great vessel aneurysm, dissection or stenosis. The pulmonary arteries and veins are normal in caliber without evidence for arterial embolic disease. There is no pericardial effusion. Mediastinum/Nodes: No new adenopathy. The subcarinal lymph node measuring 1.6 cm in short axis was  previously 1.5 cm. A left hilar lymph node is again noted measuring 1.4 cm in short axis on 4:59, previously 1.4 cm. There is a stable borderline prominent low right paratracheal space lymph node. Axillary spaces are clear. The trachea, main bronchi, thoracic esophagus are unremarkable. Lungs/Pleura: There is increasing now moderate sized left pleural effusion, some of which appears to be at least partially loculated in the anterior lower chest. There is no right-sided effusion. Some of the left pleural fluid tracks into the inferior interlobar fissure, more than previously. There are numerous diffuse metastatic lung nodules bilaterally. Again, the largest is in the medial left lower lobe base measuring 4.9 cm on 12:82, previously 4.1 cm when measured in similar fashion. Index right lower lobe perihilar lesion on 12:65 is stable in size at 12 mm, masses another right lower lobe nodule measuring 1.7 cm on 12:82. Others are much smaller. Rest of the lungs exhibit mosaicism consistent with air trapping and small airways disease, without appreciable focal pneumonia. Upper Abdomen: Diffuse innumerable hepatic metastases were better demonstrated on prior CT. In the phase of contrast in which the liver was imaged today it is difficult to distinguish individual metastases. Left adrenal nodule is stable. Minimal perihepatic ascites with no acute upper abdominal findings. Musculoskeletal: Bridging enthesopathy multiple thoracic spine levels. No destructive bone lesions in the thorax. Review of the MIP images confirms the above findings. IMPRESSION: 1. No evidence of arterial embolic disease. 2. Increasing now moderate sized left pleural effusion, some of which appears to be at least partially loculated in the anterior lower chest. 3. Numerous diffuse metastatic lung nodules bilaterally, the largest in the medial left lower lobe base measuring 4.9 cm, previously 4.1 cm. 4. Stable mediastinal and left hilar adenopathy. 5.  Diffuse innumerable hepatic metastases were better demonstrated on prior CT. In the phase of contrast in which the liver was imaged today it is difficult to distinguish individual metastases. 6. Stable left adrenal nodule. 7. Aortic and coronary artery atherosclerosis. Mild cardiomegaly. 8. Mosaic lung attenuation consistent with air trapping and small airways disease. Aortic Atherosclerosis (ICD10-I70.0). Electronically Signed   By: Francis Quam M.D.   On: 06/06/2023 04:14   DG Chest Portable 1  View Result Date: 06/06/2023 CLINICAL DATA:  Shortness of breath. History of lung cancer with chemotherapy every 3 weeks. Recent chemotherapy last week. Increasing weakness over the last few days. EXAM: PORTABLE CHEST 1 VIEW COMPARISON:  CT chest abdomen and pelvis 05/22/2023. FINDINGS: Shallow inspiration. Mild cardiac enlargement. Infiltration or atelectasis in the left lung base with small left pleural effusion. This could be edema or pneumonia. Fine nodular changes in the lungs are better seen on prior CT. No pneumothorax. Degenerative changes in the spine. IMPRESSION: Cardiac enlargement. Small left pleural effusion with basilar atelectasis or infiltration. Known pulmonary nodules are better seen on prior CT. Electronically Signed   By: Elsie Gravely M.D.   On: 06/06/2023 00:42   MR Lumbar Spine W Wo Contrast Result Date: 06/05/2023 CLINICAL DATA:  Initial evaluation for acute myelopathy. History of metastatic non-small cell lung cancer. EXAM: MRI LUMBAR SPINE WITHOUT AND WITH CONTRAST TECHNIQUE: Multiplanar and multiecho pulse sequences of the lumbar spine were obtained without and with intravenous contrast. CONTRAST:  10mL GADAVIST  GADOBUTROL  1 MMOL/ML IV SOLN COMPARISON:  Comparison made with prior CT from 05/22/2023 and PET-CT from 03/15/2023 FINDINGS: Segmentation: Standard. Lowest well-formed disc space labeled the L5-S1 level. Alignment: 3 mm degenerative retrolisthesis of L5 on S1, with trace 2 mm  anterolisthesis of L4 on L5. Findings chronic and facet mediated. Alignment otherwise normal preservation of the normal lumbar lordosis. Vertebrae: Vertebral body height maintained without acute or chronic fracture. Bone marrow signal intensity within normal limits. 7 mm lesion present within the S1 segment, with additional 8 mm lesion within the left sacral ala (series 11, images 9, 14). These lesions demonstrate hypointense precontrast T1 signal intensity, STIR hyperintensity, with postcontrast enhancement. Given history of metastatic non-small cell lung cancer, findings are concerning for small osseous metastases. No other discrete or worrisome osseous lesions. No extra osseous or epidural tumor. Conus medullaris and cauda equina: Conus extends to the L2 level. Conus and cauda equina appear normal. No abnormal enhancement. Paraspinal and other soft tissues: Paraspinous soft tissues demonstrate no acute finding. Innumerable hepatic metastases noted within the visualized liver. Disc levels: L1-2: Normal interspace. Mild bilateral facet hypertrophy. No spinal stenosis. Foramina remain patent. L2-3: Disc desiccation with diffuse disc bulge. Superimposed right foraminal to extraforaminal disc protrusion contacts the exiting right L2 nerve root (series 9, image 17). Moderate right with mild-to-moderate left facet hypertrophy. Resultant mild canal with moderate right lateral recess stenosis. Mild to moderate right L2 foraminal narrowing. Left neural foramina remains patent. L3-4: Disc desiccation without significant disc bulge. Moderate facet and ligament flavum hypertrophy. No significant spinal stenosis. Foramina remain patent. L4-5: Disc desiccation without significant disc bulge. Moderate to severe bilateral facet arthrosis. Resultant mild canal with bilateral subarticular stenosis. Mild to moderate bilateral L4 foraminal stenosis. L5-S1: Degenerative vertebral disc space narrowing with disc desiccation and diffuse  disc bulge. Reactive endplate spurring. Small central annular fissure. Mild facet hypertrophy. No significant spinal stenosis. Moderate bilateral L5 foraminal narrowing. IMPRESSION: 1. No acute abnormality within the lumbar spine. No high-grade spinal stenosis or evidence for cord compression. 2. 7 mm lesion within the S1 segment, with additional 8 mm lesion within the left sacral ala. Given history of metastatic non-small cell lung cancer, findings are concerning for small osseous metastases. No extra osseous or epidural tumor. 3. Right foraminal to extraforaminal disc protrusion at L2-3, potentially affecting the exiting right L2 nerve root. 4. Moderate to advanced lower lumbar facet hypertrophy, which could contribute to lower Palmer pain. 5. Innumerable hepatic  metastases within the visualized liver. Electronically Signed   By: Morene Hoard M.D.   On: 06/05/2023 22:43    EKG: Independently reviewed.  Sinus tachycardia.  Assessment/Plan Principal Problem:   Lower extremity weakness Active Problems:   Hypothyroidism   Obstructive sleep apnea   Essential hypertension   Chronic diastolic HF (heart failure) (HCC)   Class 3 severe obesity with serious comorbidity and body mass index (BMI) of 50.0 to 59.9 in adult [facility])   Adenocarcinoma of left lung, stage 4 (HCC)   Neutropenia (HCC)    Bilateral lower extremity weakness -    I discussed with Dr. Voncile on-call neurologist.  Neurologist felt that patient's symptoms may be due to deconditioning.  However suggested getting MRI of the brain.  In addition will check TSH B12 and CK levels.  Physical therapy consult. Neutropenia with history of stage IV lung cancer likely due to recent chemotherapy last week.  Patient is afebrile did receive 1 dose of Granix  after ER physician discussed with Dr. Sherrod oncologist.  Follow CBC with differentials.  Primary attending oncologist added to the list. Left pleural effusion likely malignant.  Presently  asymptomatic.  Will await any further recommendations from oncologist. Hypertension on labetalol  lisinopril  and amlodipine . Hyperlipidemia on statins and fenofibrate . Sleep apnea on CPAP. Macrocytic anemia check anemia panel. Thrombocytopenia likely related to chemotherapy. Diastolic CHF appears compensated.  Hold Lasix  for now since patient has weakness. Depression on Latuda  and Cogentin . Neuropathy on gabapentin . Mild hypokalemia replace and recheck.  Since patient has bilateral lower extremity weakness will need more than 2 midnight stay.   DVT prophylaxis: Lovenox . Code Status: Full code. Family Communication: Discussed with patient. Disposition Plan: Medical floor. Consults called: Discussed with neurologist.  Physical therapy. Admission status: Observation.

## 2023-06-06 NOTE — Progress Notes (Signed)
 PROGRESS NOTE    Madison Palmer  FMW:[id number] DOB: 13-Oct-1965 DOA: 06/05/2023 PCP: Katheen Roselie Rockford, NP   Brief Narrative: 58 year old with past medical history significant for stage IV lung cancer on chemotherapy, and Keytruda  last dose was 05/29/2023, history of diastolic heart failure, hypertension, hyperlipidemia, sleep apnea, depression, hypothyroidism, who presented with progressive lower extremity weakness over the last 2 weeks.  Patient states that her weakness was initially on the right side and in the last few days presents on both lower extremities.  Patient recently had follow-up with oncologist Dr. Gatha on 1/28 when MRI of the brain was planted.  MRI of the brain 1/21st was negative for acute finding.   Assessment & Plan:   Principal Problem:   Lower extremity weakness Active Problems:   Hypothyroidism   Obstructive sleep apnea   Essential hypertension   Chronic diastolic HF (heart failure) (HCC)   Class 3 severe obesity with serious comorbidity and body mass index (BMI) of 50.0 to 59.9 in adult [facility])   Adenocarcinoma of left lung, stage 4 (HCC)   Neutropenia (HCC)   Neuropathy   Macrocytic anemia   Thrombocytopenia (HCC)   1-Bilateral lower extremity weakness:  -Admitted discussed case with on-call neurology, who thought her symptoms were more related to deconditioning. -MRI brain: Pending  -MR Lumbar:  7 mm lesion within the S1 segment, with additional 8 mm lesion within the left sacral ala. Given history of metastatic non-small cell lung cancer, findings are concerning for small osseous metastases. No extra osseous or epidural tumor. Right foraminal to extraforaminal disc protrusion at L2-3, potentially affecting the exiting right L2 nerve root. Moderate to advanced lower lumbar facet hypertrophy, which could contribute to lower back pain.  2-Neutropenia, History of stage IV Lung cancer;  Neutropenia in the setting of recent chemotherapy Cussed with Dr.  Gatha plan to continue with Granix  on the ANC more than 1500  3-Left pleural effusion concern with malignancy versus loculated -Discussed with Dr. Gatha we can proceed with thoracentesis when white blood cell counts improve -Added IV antibiotics to cover for infectious process -Start IV cefepime  and vancomycin    4-panniculitis, pubic area with redness and sluggish skin.  Concern with superimposed bacterial cellulitis -Started IV antibiotics -Nystatin  powder  Hypertension: Continue with labetalol , lisinopril  and amlodipine   Hyperlipidemia: Continue with the statins and fenofibrate   Lipid apnea, continue with CPAP  Normocytic  anemia: Follow anemia panel  thrombocytopenia in the setting of chemotherapy  Diastolic heart failure chronic compensated -resume lasix  tomorrow  Depression: Continue Latuda  and Cogentin  Neuropathy: Continue with gabapentin  Mild hypokalemia: Replace    Estimated body mass index is 51.45 kg/m as calculated from the following:   Height as of this encounter: 5' 2 (1.575 m).   Weight as of this encounter: 127.6 kg.   DVT prophylaxis: SCDs Code Status: Full code Family Communication: Care discussed with patient Disposition Plan:  Status is: Observation The patient will require care spanning > 2 midnights and should be moved to inpatient because: Management of neutropenia, weakness pleural effusion    Consultants:  Oncology   Procedures:  none  Antimicrobials:    Subjective: She is alert, she reports mild shortness of breath, she reports bilateral lower extremity weakness  Objective: Vitals:   06/06/23 0400 06/06/23 0500 06/06/23 0552 06/06/23 0600  BP: 122/73 121/81  113/61  Pulse: 88 88  86  Resp: 18 18  18   Temp:   97.8 F (36.6 C)   TempSrc:   Oral  SpO2: 95% 94%  95%  Weight:      Height:       No intake or output data in the 24 hours ending 06/06/23 0846 Filed Weights   06/05/23 1621  Weight: 127.6 kg     Examination:  General exam: Appears calm and comfortable  Respiratory system: Clear to auscultation. Respiratory effort normal. Cardiovascular system: S1 & S2 heard, RRR. No JVD, murmurs, rubs, gallops or clicks. No pedal edema. Gastrointestinal system: Abdomen is nondistended, soft and nontender. No organomegaly or masses felt. Normal bowel sounds heard. Central nervous system: Alert and oriented. No focal neurological deficits. Extremities: She is able to move bilateral lower lower extremities   Data Reviewed: I have personally reviewed following labs and imaging studies  CBC: Recent Labs  Lab 06/05/23 1638 06/06/23 0557  WBC 1.0* 0.7*  NEUTROABS 0.4* 0.2*  HGB 10.5* 9.5*  HCT 32.5* 29.8*  MCV 106.6* 108.4*  PLT 109* 83*   Basic Metabolic Panel: Recent Labs  Lab 06/05/23 1638 06/06/23 0557  NA 141 140  K 3.2* 3.7  CL 96* 95*  CO2 33* 34*  GLUCOSE 116* 122*  BUN 17 21*  CREATININE 0.98 1.02*  CALCIUM  10.1 9.9  MG  --  2.0   GFR: Estimated Creatinine Clearance: 77.9 mL/min (A) (by C-G formula based on SCr of 1.02 mg/dL (H)). Liver Function Tests: Recent Labs  Lab 06/06/23 0557  AST 39  ALT 33  ALKPHOS 60  BILITOT 2.1*  PROT 6.5  ALBUMIN 2.7*   No results for input(s): LIPASE, AMYLASE in the last 168 hours. No results for input(s): AMMONIA in the last 168 hours. Coagulation Profile: No results for input(s): INR, PROTIME in the last 168 hours. Cardiac Enzymes: Recent Labs  Lab 06/06/23 0557  CKTOTAL 66   BNP (last 3 results) No results for input(s): PROBNP in the last 8760 hours. HbA1C: No results for input(s): HGBA1C in the last 72 hours. CBG: No results for input(s): GLUCAP in the last 168 hours. Lipid Profile: No results for input(s): CHOL, HDL, LDLCALC, TRIG, CHOLHDL, LDLDIRECT in the last 72 hours. Thyroid  Function Tests: Recent Labs    06/06/23 0557  TSH 0.769   Anemia Panel: Recent Labs     06/06/23 0557  VITAMINB12 1,358*  FOLATE 18.0  FERRITIN 1,468*  TIBC 259  IRON 210*  RETICCTPCT <0.4*   Sepsis Labs: No results for input(s): PROCALCITON, LATICACIDVEN in the last 168 hours.  No results found for this or any previous visit (from the past 240 hours).       Radiology Studies: CT Angio Chest PE W and/or Wo Contrast Result Date: 06/06/2023 CLINICAL DATA:  Increasing weakness following chemotherapy last week. History of non-small cell lung cancer, chronic respiratory distress. Portable chest obtained prior to CT. EXAM: CT ANGIOGRAPHY CHEST WITH CONTRAST TECHNIQUE: Multidetector CT imaging of the chest was performed using the standard protocol during bolus administration of intravenous contrast. Multiplanar CT image reconstructions and MIPs were obtained to evaluate the vascular anatomy. RADIATION DOSE REDUCTION: This exam was performed according to the departmental dose-optimization program which includes automated exposure control, adjustment of the mA and/or kV according to patient size and/or use of iterative reconstruction technique. CONTRAST:  OMNIPAQUE  IOHEXOL  350 MG/ML SOLN COMPARISON:  Chest CT with contrast 05/22/2023, PET-CT 03/15/2023. FINDINGS: Cardiovascular: There is moderate left main and three-vessel coronary artery calcific plaque. Mild cardiomegaly with a left chamber predominance and mild aortic atherosclerosis. There is no aortic or great vessel aneurysm, dissection  or stenosis. The pulmonary arteries and veins are normal in caliber without evidence for arterial embolic disease. There is no pericardial effusion. Mediastinum/Nodes: No new adenopathy. The subcarinal lymph node measuring 1.6 cm in short axis was previously 1.5 cm. A left hilar lymph node is again noted measuring 1.4 cm in short axis on 4:59, previously 1.4 cm. There is a stable borderline prominent low right paratracheal space lymph node. Axillary spaces are clear. The trachea, main bronchi,  thoracic esophagus are unremarkable. Lungs/Pleura: There is increasing now moderate sized left pleural effusion, some of which appears to be at least partially loculated in the anterior lower chest. There is no right-sided effusion. Some of the left pleural fluid tracks into the inferior interlobar fissure, more than previously. There are numerous diffuse metastatic lung nodules bilaterally. Again, the largest is in the medial left lower lobe base measuring 4.9 cm on 12:82, previously 4.1 cm when measured in similar fashion. Index right lower lobe perihilar lesion on 12:65 is stable in size at 12 mm, masses another right lower lobe nodule measuring 1.7 cm on 12:82. Others are much smaller. Rest of the lungs exhibit mosaicism consistent with air trapping and small airways disease, without appreciable focal pneumonia. Upper Abdomen: Diffuse innumerable hepatic metastases were better demonstrated on prior CT. In the phase of contrast in which the liver was imaged today it is difficult to distinguish individual metastases. Left adrenal nodule is stable. Minimal perihepatic ascites with no acute upper abdominal findings. Musculoskeletal: Bridging enthesopathy multiple thoracic spine levels. No destructive bone lesions in the thorax. Review of the MIP images confirms the above findings. IMPRESSION: 1. No evidence of arterial embolic disease. 2. Increasing now moderate sized left pleural effusion, some of which appears to be at least partially loculated in the anterior lower chest. 3. Numerous diffuse metastatic lung nodules bilaterally, the largest in the medial left lower lobe base measuring 4.9 cm, previously 4.1 cm. 4. Stable mediastinal and left hilar adenopathy. 5. Diffuse innumerable hepatic metastases were better demonstrated on prior CT. In the phase of contrast in which the liver was imaged today it is difficult to distinguish individual metastases. 6. Stable left adrenal nodule. 7. Aortic and coronary artery  atherosclerosis. Mild cardiomegaly. 8. Mosaic lung attenuation consistent with air trapping and small airways disease. Aortic Atherosclerosis (ICD10-I70.0). Electronically Signed   By: Francis Quam M.D.   On: 06/06/2023 04:14   DG Chest Portable 1 View Result Date: 06/06/2023 CLINICAL DATA:  Shortness of breath. History of lung cancer with chemotherapy every 3 weeks. Recent chemotherapy last week. Increasing weakness over the last few days. EXAM: PORTABLE CHEST 1 VIEW COMPARISON:  CT chest abdomen and pelvis 05/22/2023. FINDINGS: Shallow inspiration. Mild cardiac enlargement. Infiltration or atelectasis in the left lung base with small left pleural effusion. This could be edema or pneumonia. Fine nodular changes in the lungs are better seen on prior CT. No pneumothorax. Degenerative changes in the spine. IMPRESSION: Cardiac enlargement. Small left pleural effusion with basilar atelectasis or infiltration. Known pulmonary nodules are better seen on prior CT. Electronically Signed   By: Elsie Gravely M.D.   On: 06/06/2023 00:42   MR Lumbar Spine W Wo Contrast Result Date: 06/05/2023 CLINICAL DATA:  Initial evaluation for acute myelopathy. History of metastatic non-small cell lung cancer. EXAM: MRI LUMBAR SPINE WITHOUT AND WITH CONTRAST TECHNIQUE: Multiplanar and multiecho pulse sequences of the lumbar spine were obtained without and with intravenous contrast. CONTRAST:  10mL GADAVIST  GADOBUTROL  1 MMOL/ML IV SOLN COMPARISON:  Comparison made with prior CT from 05/22/2023 and PET-CT from 03/15/2023 FINDINGS: Segmentation: Standard. Lowest well-formed disc space labeled the L5-S1 level. Alignment: 3 mm degenerative retrolisthesis of L5 on S1, with trace 2 mm anterolisthesis of L4 on L5. Findings chronic and facet mediated. Alignment otherwise normal preservation of the normal lumbar lordosis. Vertebrae: Vertebral body height maintained without acute or chronic fracture. Bone marrow signal intensity within normal  limits. 7 mm lesion present within the S1 segment, with additional 8 mm lesion within the left sacral ala (series 11, images 9, 14). These lesions demonstrate hypointense precontrast T1 signal intensity, STIR hyperintensity, with postcontrast enhancement. Given history of metastatic non-small cell lung cancer, findings are concerning for small osseous metastases. No other discrete or worrisome osseous lesions. No extra osseous or epidural tumor. Conus medullaris and cauda equina: Conus extends to the L2 level. Conus and cauda equina appear normal. No abnormal enhancement. Paraspinal and other soft tissues: Paraspinous soft tissues demonstrate no acute finding. Innumerable hepatic metastases noted within the visualized liver. Disc levels: L1-2: Normal interspace. Mild bilateral facet hypertrophy. No spinal stenosis. Foramina remain patent. L2-3: Disc desiccation with diffuse disc bulge. Superimposed right foraminal to extraforaminal disc protrusion contacts the exiting right L2 nerve root (series 9, image 17). Moderate right with mild-to-moderate left facet hypertrophy. Resultant mild canal with moderate right lateral recess stenosis. Mild to moderate right L2 foraminal narrowing. Left neural foramina remains patent. L3-4: Disc desiccation without significant disc bulge. Moderate facet and ligament flavum hypertrophy. No significant spinal stenosis. Foramina remain patent. L4-5: Disc desiccation without significant disc bulge. Moderate to severe bilateral facet arthrosis. Resultant mild canal with bilateral subarticular stenosis. Mild to moderate bilateral L4 foraminal stenosis. L5-S1: Degenerative vertebral disc space narrowing with disc desiccation and diffuse disc bulge. Reactive endplate spurring. Small central annular fissure. Mild facet hypertrophy. No significant spinal stenosis. Moderate bilateral L5 foraminal narrowing. IMPRESSION: 1. No acute abnormality within the lumbar spine. No high-grade spinal  stenosis or evidence for cord compression. 2. 7 mm lesion within the S1 segment, with additional 8 mm lesion within the left sacral ala. Given history of metastatic non-small cell lung cancer, findings are concerning for small osseous metastases. No extra osseous or epidural tumor. 3. Right foraminal to extraforaminal disc protrusion at L2-3, potentially affecting the exiting right L2 nerve root. 4. Moderate to advanced lower lumbar facet hypertrophy, which could contribute to lower back pain. 5. Innumerable hepatic metastases within the visualized liver. Electronically Signed   By: Morene Hoard M.D.   On: 06/05/2023 22:43        Scheduled Meds:  amLODipine   10 mg Oral Daily   atorvastatin   20 mg Oral Q M,W,F   benztropine   0.5 mg Oral QHS   cholecalciferol   5,000 Units Oral Daily   fenofibrate   160 mg Oral Daily   folic acid   1 mg Oral Daily   gabapentin   200 mg Oral QHS   labetalol   450 mg Oral BID   levothyroxine   88 mcg Oral Q0600   lisinopril   40 mg Oral Daily   lurasidone   40 mg Oral Q breakfast   nystatin    Topical TID   pantoprazole   40 mg Oral Daily   Continuous Infusions:   LOS: 0 days    Time spent: 35 minutes    Dayton Kenley A Tyger Wichman, MD [facility]   If 7PM-7AM, please contact night-coverage [email or web address]  06/06/2023, 8:46 AM

## 2023-06-06 NOTE — ED Notes (Signed)
 Pt back from CT scan and back on CPAP

## 2023-06-06 NOTE — Evaluation (Signed)
 Physical Therapy Evaluation Patient Details Name: Madison Palmer MRN: [id number] DOB: 1965-05-09 Today's Date: 06/06/2023  History of Present Illness  DORRENE BENTLY is a 58 y.o. female presents to ED 2//25  with progressive Lower extremity weakness, SOB,  CT angiogram of the chest was negative for PE but did show a moderate-sized left pleural effusion with  also evidence of metastatic disease. Labs show mild hypokalemia macrocytic anemia and neutropenia.  EFY:dujhz IV lung cancer on chemotherapy and Keytruda  last dose was on 05/29/2023 with history of diastolic CHF, hypertension, hyperlipidemia, sleep apnea, depression, hypothyroidism.  Clinical Impression  Pt admitted with above diagnosis.  Pt currently with functional limitations due to the deficits listed below (see PT Problem List). Pt will benefit from acute skilled PT to increase their independence and safety with mobility to allow discharge.     The patient is very quiet, no family present. 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 Patient is independent with SPC at baseline. Patient reports resides with 55 yo son .  Patient  with noted LUE tremor. Patient tolerated  mobilizing to sitting, reported feeling weak so unable to fully stand at bedside at Rw.  Patient should progress to return home.          If plan is discharge home, recommend the following: A little help with walking and/or transfers;Assistance with cooking/housework;Assist for transportation;Help with stairs or ramp for entrance   Can travel by private vehicle        Equipment Recommendations Rolling walker (2 wheels)  Recommendations for Other Services       Functional Status Assessment       Precautions / Restrictions Precautions Precautions: Fall      Mobility  Bed Mobility Overal bed mobility: Needs Assistance Bed Mobility: Supine to Sit, Sit to Supine     Supine to sit: Min assist Sit to supine: Mod assist    General bed mobility comments: assist with legs and trunk to sitting and  assist legs back onto bed    Transfers Overall transfer level: Needs assistance Equipment used: Rolling walker (2 wheels) Transfers: Sit to/from Stand Sit to Stand: Mod assist, +2 safety/equipment           General transfer comment: partial stand at RW, did not fully rise to stand, patient reporting feeling weak.    Ambulation/Gait                  Stairs            Wheelchair Mobility     Tilt Bed    Modified Rankin (Stroke Patients Only)       Balance Overall balance assessment: Needs assistance Sitting-balance support: Feet unsupported, Bilateral upper extremity supported Sitting balance-Leahy Scale: Fair                                       Pertinent Vitals/Pain Pain Assessment Pain Assessment: Faces Faces Pain Scale: Hurts even more Pain Location: legs Pain Descriptors / Indicators: Discomfort    Home Living Family/patient expects to be discharged to:: Private residence Living Arrangements: Children Available Help at Discharge: Family;Available PRN/intermittently Type of Home: House Home Access: Stairs to enter   Entrance Stairs-Number of Steps: thresshold   Home Layout: One level Home Equipment: [place] - single point Additional Comments: patient states that she lives with son and drives to [place] daily  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 Does not mention spouse    Prior Function Prior Level of Function : Driving             Mobility Comments: uses a cane       Extremity/Trunk Assessment   Upper Extremity Assessment Upper Extremity Assessment:  (noted tremors on LUE)    Lower Extremity Assessment Lower Extremity Assessment: Generalized weakness    Cervical / Trunk Assessment Cervical / Trunk Assessment: Other exceptions Cervical / Trunk Exceptions: body habitus  Communication   Communication Communication: No  apparent difficulties  Cognition Arousal: Alert Behavior During Therapy: Flat affect Overall Cognitive Status: Difficult to assess                                 General Comments: no family present, patient reports that she was in [place]  when she  was unable to stand, unsure of reliability.        General Comments      Exercises     Assessment/Plan    PT Assessment Patient needs continued PT services  PT Problem List Decreased strength;Decreased cognition;Decreased knowledge of precautions;Pain;Decreased range of motion;Decreased knowledge of use of DME;Decreased mobility;Decreased activity tolerance       PT Treatment Interventions DME instruction;Therapeutic activities;Gait training;Therapeutic exercise;Patient/family education;Functional mobility training    PT Goals (Current goals can be found in the Care Plan section)  Acute Rehab PT Goals Patient Stated Goal: agreed to sitting PT Goal Formulation: With patient Time For Goal Achievement: 06/20/23 Potential to Achieve Goals: Fair    Frequency Min 1X/week     Co-evaluation               AM-PAC PT 6 Clicks Mobility  Outcome Measure Help needed turning from your back to your side while in a flat bed without using bedrails?: A Lot Help needed moving from lying on your back to sitting on the side of a flat bed without using bedrails?: A Lot Help needed moving to and from a bed to a chair (including a wheelchair)?: A Lot Help needed standing up from a chair using your arms (e.g., wheelchair or bedside chair)?: Total Help needed to walk in hospital room?: Total Help needed climbing 3-5 steps with a railing? : Total 6 Click Score: 9    End of Session   Activity Tolerance: Patient limited by fatigue Patient left: in bed;with call bell/phone within reach Nurse Communication: Mobility status PT Visit Diagnosis: Unsteadiness on feet (R26.81);Muscle weakness (generalized) (M62.81);Difficulty in  walking, not elsewhere classified (R26.2);Pain Pain - Right/Left: Left Pain - part of body: Leg    Time: 0928-0955 PT Time Calculation (min) (ACUTE ONLY): 27 min   Charges:   PT Evaluation $PT Eval Low Complexity: 1 Low PT Treatments $Therapeutic Activity: 8-22 mins PT General Charges $$ ACUTE PT VISIT: 1 Visit         Darice Potters PT Acute Rehabilitation Services Office [phone] Weekend [phone]   Potters Darice Norris 06/06/2023, 10:43 AM

## 2023-06-06 NOTE — Progress Notes (Signed)
 Pharmacy Antibiotic Note  Madison Palmer is a 58 y.o. female admitted on 06/05/2023 with lower extremity weakness. Noted to have increasing malignant pleural effusion with new O2 requirement. Also noted to be neutropenic but afebrile, and no reported fevers, chills, cough, or URI symptoms here or prior to admission. Pharmacy has been consulted for vancomycin  and cefepime  dosing for pneumonia.  Plan: Vancomycin  1000 mg IV now, then 1500 mg IV q24 hr (est AUC 524 based on SCr 1.02; Vd 0.5) Measure vancomycin  AUC at steady state as indicated SCr q48 while on vanc MRSA PCR ordered; f/u and narrow vanc as appropriate Cefepime  2 g IV q8 hr Expect low threshold to narrow abx given all symptoms explained by malignant pleural effusion   Height: 5' 2 (157.5 cm) Weight: 127.6 kg (281 lb 4.9 oz) IBW/kg (Calculated) : 50.1  Temp (24hrs), Avg:98.4 F (36.9 C), Min:97.8 F (36.6 C), Max:98.9 F (37.2 C)  Recent Labs  Lab 06/05/23 1638 06/06/23 0557  WBC 1.0* 0.7*  CREATININE 0.98 1.02*    Estimated Creatinine Clearance: 77.9 mL/min (A) (by C-G formula based on SCr of 1.02 mg/dL (H)).    Allergies  Allergen Reactions   Other Other (See Comments)    Hydralazine  Hcl Other reaction(s): Other (See Comments) Instructed not to take by Cardiology.   Fentanyl  Hives    Other reaction(s): hives had fentanyl  recently with benadryl , did OK   Levofloxacin Hives   Midazolam  Hives   Pollen Extract     seasonal   Atorvastatin  Other (See Comments)    Muscle pain in legs  Abdominal pain   Hydralazine  Hcl Other (See Comments)    Hypercalcemia    Rosuvastatin Other (See Comments)    Abdominal pain Other reaction(s): diarrhea    Antimicrobials this admission: 2/5 vancomycin  >>  2/5 cefepime  >>   Dose adjustments this admission: N/a  Microbiology results: 2/5 BCx: ordered 2/5 MRSA PCR: ordered  Thank you for allowing pharmacy to be a part of this patient's care.  Juanelle Trueheart  A 06/06/2023 8:54 AM

## 2023-06-06 NOTE — Plan of Care (Signed)

## 2023-06-06 NOTE — ED Notes (Signed)
Dr. Rancour at bedside. 

## 2023-06-06 NOTE — ED Notes (Signed)
 Noted pt got out of bed w/o assist. Found pt standing and leaning over bedside table, then transitioned to leaning over bed. Informed pt to get back in bed for safety d/t weakness. Pt strongly refused. Pt requested to sit in chair. Noted pt is still weak. Educated pt on safety and recommended pt to return to bed. Pt continues to refuse. Pt currently sitting on the chair next to bed. Call bell in reach

## 2023-06-06 NOTE — ED Notes (Signed)
 Pt assisted to [facility] w/ +2 assist for safety. Noted pt had very large, loose BM. Requires max assist for pericare. Pt transferred to bed w/ +2 assist. Pt noted SOB post-transfer. Pt readjusted in bed. Encouraged pt to utilize call bell. Call bell placed in reach

## 2023-06-06 NOTE — Progress Notes (Signed)
   06/06/23 0112  BiPAP/CPAP/SIPAP  BiPAP/CPAP/SIPAP Pt Type Adult  BiPAP/CPAP/SIPAP Resmed  Mask Type Nasal mask  Dentures removed? Not applicable  Mask Size Medium  EPAP 9 cmH2O (per pt)  Patient Home Equipment No  Auto Titrate No

## 2023-06-06 NOTE — ED Notes (Signed)
 Pt taken to CT.

## 2023-06-06 NOTE — ED Notes (Signed)
Contacted RT for home cpap setup

## 2023-06-07 ENCOUNTER — Inpatient Hospital Stay (HOSPITAL_COMMUNITY): Payer: BC Managed Care – PPO

## 2023-06-07 DIAGNOSIS — R531 Weakness: Secondary | ICD-10-CM | POA: Diagnosis not present

## 2023-06-07 LAB — BLOOD GAS, VENOUS
Acid-Base Excess: 14.4 mmol/L — ABNORMAL HIGH (ref 0.0–2.0)
Bicarbonate: 40.3 mmol/L — ABNORMAL HIGH (ref 20.0–28.0)
O2 Saturation: 95.8 %
Patient temperature: 37
pCO2, Ven: 58 mm[Hg] (ref 44–60)
pH, Ven: 7.45 — ABNORMAL HIGH (ref 7.25–7.43)
pO2, Ven: 67 mm[Hg] — ABNORMAL HIGH (ref 32–45)

## 2023-06-07 LAB — CBC WITH DIFFERENTIAL/PLATELET
Abs Immature Granulocytes: 0.01 10*3/uL (ref 0.00–0.07)
Basophils Absolute: 0 10*3/uL (ref 0.0–0.1)
Basophils Relative: 1 %
Eosinophils Absolute: 0 10*3/uL (ref 0.0–0.5)
Eosinophils Relative: 0 %
HCT: 29.1 % — ABNORMAL LOW (ref 36.0–46.0)
Hemoglobin: 9.2 g/dL — ABNORMAL LOW (ref 12.0–15.0)
Immature Granulocytes: 1 %
Lymphocytes Relative: 24 %
Lymphs Abs: 0.3 10*3/uL — ABNORMAL LOW (ref 0.7–4.0)
MCH: 34.6 pg — ABNORMAL HIGH (ref 26.0–34.0)
MCHC: 31.6 g/dL (ref 30.0–36.0)
MCV: 109.4 fL — ABNORMAL HIGH (ref 80.0–100.0)
Monocytes Absolute: 0.1 10*3/uL (ref 0.1–1.0)
Monocytes Relative: 6 %
Neutro Abs: 1 10*3/uL — ABNORMAL LOW (ref 1.7–7.7)
Neutrophils Relative %: 68 %
Platelet Morphology: NORMAL
Platelets: 42 10*3/uL — ABNORMAL LOW (ref 150–400)
RBC: 2.66 MIL/uL — ABNORMAL LOW (ref 3.87–5.11)
RDW: 17 % — ABNORMAL HIGH (ref 11.5–15.5)
Smear Review: DECREASED
WBC: 1.4 10*3/uL — CL (ref 4.0–10.5)
nRBC: 0 % (ref 0.0–0.2)

## 2023-06-07 LAB — COMPREHENSIVE METABOLIC PANEL
ALT: 26 U/L (ref 0–44)
AST: 26 U/L (ref 15–41)
Albumin: 2.4 g/dL — ABNORMAL LOW (ref 3.5–5.0)
Alkaline Phosphatase: 58 U/L (ref 38–126)
Anion gap: 8 (ref 5–15)
BUN: 28 mg/dL — ABNORMAL HIGH (ref 6–20)
CO2: 34 mmol/L — ABNORMAL HIGH (ref 22–32)
Calcium: 10.2 mg/dL (ref 8.9–10.3)
Chloride: 99 mmol/L (ref 98–111)
Creatinine, Ser: 1.33 mg/dL — ABNORMAL HIGH (ref 0.44–1.00)
GFR, Estimated: 47 mL/min — ABNORMAL LOW (ref >60)
Glucose, Bld: 196 mg/dL — ABNORMAL HIGH (ref 70–99)
Potassium: 3.8 mmol/L (ref 3.5–5.1)
Sodium: 141 mmol/L (ref 135–145)
Total Bilirubin: 1.3 mg/dL — ABNORMAL HIGH (ref 0.0–1.2)
Total Protein: 6.1 g/dL — ABNORMAL LOW (ref 6.5–8.1)

## 2023-06-07 LAB — MAGNESIUM: Magnesium: 1.8 mg/dL (ref 1.7–2.4)

## 2023-06-07 MED ORDER — TBO-FILGRASTIM 480 MCG/0.8ML ~~LOC~~ SOSY
480.0000 ug | PREFILLED_SYRINGE | Freq: Once | SUBCUTANEOUS | Status: AC
  Administered 2023-06-07: 480 ug via SUBCUTANEOUS
  Filled 2023-06-07 (×2): qty 0.8

## 2023-06-07 MED ORDER — MELATONIN 5 MG PO TABS
5.0000 mg | ORAL_TABLET | Freq: Every evening | ORAL | Status: AC | PRN
  Administered 2023-06-07 – 2023-06-10 (×3): 5 mg via ORAL
  Filled 2023-06-07 (×3): qty 1

## 2023-06-07 MED ORDER — MAGNESIUM SULFATE 2 GM/50ML IV SOLN
2.0000 g | Freq: Once | INTRAVENOUS | Status: AC
  Administered 2023-06-07: 2 g via INTRAVENOUS
  Filled 2023-06-07: qty 50

## 2023-06-07 NOTE — Consult Note (Signed)
 NAME:  Madison Palmer, MRN:  [id number], DOB:  05-May-1965, LOS: 1 ADMISSION DATE:  06/05/2023, CONSULTATION DATE:  06/07/23 REFERRING MD:  Dr. Madelyne, CHIEF COMPLAINT:  LE weakness   History of Present Illness:  48 yoF with PMH as below significant for stage IV metastatic lung cancer (diagnosed 08/2022, bilateral lung nodules), OSA on CPAP w/ nocturnal 2L O2, HFpEF, and HTN who presented with progressive lower extremity weakness with recent fall at home due to weakness.   Followed by Dr. Gatha.  Currently on palliative systemic chemotherapy and immunotherapy with carboplatin , alimta  and keytruda , last treatment 1/28.  Reports chronic periodic DOE, chronic cough but more yellowish over the last week.  Denies fevers, dysphagia, no LE edema, chronic orthopnea.  Uses CPAP with nocturnal O2 2L otherwise has not needed O2.  Still works as runner, broadcasting/film/video. Never smoker.  Denies any current SOB presently.   Underwent CTA on admit which was negative for PE but showed enlarging left pleural effusion compared to 05/22/23 imaging, partially loculated appearing; no focal infiltrate, showing numerous bilateral lung nodules.  Not requiring O2 on admit.  Admitted for further workup, found to be neutropenic, afebrile. Some concern for panniculitis, started on vanc/ cefepime  empirically.  Was given granix  on admit per oncology.  Has had intermittent O2 requirements, more after exertional episodes, suspected due to anxiety and deconditioning.   Repeat CXR today after worsening SOB, was unable to fully complete MR lumbar today laying flat with increased O2 requirement up to 4L showed small left pleural effusion with left basilar atelectasis vs infiltrate.   PCCM consulted for further input.   Pertinent  Medical History   Past Medical History:  Diagnosis Date   Acute on chronic respiratory failure with hypoxia and hypercapnia (HCC) 09/15/2018   Anemia    Anxiety    Arrhythmia    tachycardia   Arthritis    Bipolar 1 disorder  (HCC) 09/12/2018   Bipolar I disorder, current or most recent episode manic, with psychotic features (HCC)    Brief psychotic disorder (HCC) 08/22/2018   Chronic respiratory failure with hypoxia (HCC) 05/10/2018   Formatting of this note might be different from the original. Last Assessment & Plan:  Continue 1 L continuous on exertion and during sleep. On her next visit we will check OSA on CPAP/room air to decide whether she needs to take oxygen along with her on her cruise in May   Depression    Diverticulitis    Generalized edema 03/12/2018   GERD (gastroesophageal reflux disease)    Glaucoma    Hyperlipidemia    Hyperparathyroidism (HCC)    Hypertension    Inflammatory polyps of colon (HCC)    Leg swelling 01/14/2019   LVH (left ventricular hypertrophy)    Lymphedema    Morbid (severe) obesity due to excess calories (HCC) 05/11/2017   PCOS (polycystic ovarian syndrome)    Peripheral vascular disease (HCC)    Prediabetes    Recurrent UTI    Sleep apnea    CPAP   Thyroid  disease    Vitamin D  deficiency     Significant Hospital Events: Including procedures, antibiotic start and stop dates in addition to other pertinent events   2/5 admit  Interim History / Subjective:   Objective   Blood pressure (!) 112/58, pulse 86, temperature 98.3 F (36.8 C), resp. rate 20, height 5' 2 (1.575 m), weight 128.6 kg, last menstrual period 05/02/2017, SpO2 98%.        Intake/Output Summary (Last  24 hours) at 06/07/2023 1752 Last data filed at 06/07/2023 1326 Gross per 24 hour  Intake 238 ml  Output 1400 ml  Net -1162 ml   Filed Weights   06/05/23 1621 06/06/23 1605  Weight: 127.6 kg 128.6 kg   Examination: General:  Pleasant older female lying in bed, NAD but very anxious appearing HEENT: MM pink/moist Neuro: Alert, appropriate, MAE, no obvious focal deficit CV: irir PULM:  non labored, speaking full sentences, diffuse fine scattered rales Extremities: warm/dry, no tibial edema,  chronic discoloration    CTA PE 2/5>   1. No evidence of arterial embolic disease. 2. Increasing now moderate sized left pleural effusion, some of which appears to be at least partially loculated in the anterior lower chest. 3. Numerous diffuse metastatic lung nodules bilaterally, the largest in the medial left lower lobe base measuring 4.9 cm, previously 4.1 cm. 4. Stable mediastinal and left hilar adenopathy. 5. Diffuse innumerable hepatic metastases were better demonstrated on prior CT. In the phase of contrast in which the liver was imaged today it is difficult to distinguish individual metastases. 6. Stable left adrenal nodule. 7. Aortic and coronary artery atherosclerosis. Mild cardiomegaly. 8. Mosaic lung attenuation consistent with air trapping and small airways disease. Aortic Atherosclerosis  Resolved Hospital Problem list     Assessment & Plan:   Stage IV metastatic lung cancer on current palliative chemotherapy and immunotherapy Left loculated pleural effusion Hypoxic respiratory failure- suspect multifactorial from deconditioning, anxiety, atelectasis and metastatic disease  OSA on CPAP and nocturnal O2, 2L Neutropenia - Discussed with attending.  Agree with Dr. Emery to hold off on diagnostic thoracentesis for now until WBC improved as pt currently asymptomatic at rest, seems near her baseline respiratory symptoms.  Does have some fluid on bedside US .  No focal infiltrate on CT, could be progression of metastatic disease.  Seems at euvolemia, no evidence of volume overload.  Agree with continuing empiric abx for now given reports of change in sputum color over last week or so.  Consider IR thoracentesis if ongoing O2 requirement, worsening of symptoms and when ok with oncology - supplemental O2 for sat goal > 90%.  Consider checking ambulatory sats prior to d/c.  - nightly CPAP - pulm hygiene - IS, PT - defer to primary for anxiolytic, on latuda  and  cogentin    Remainder per primary team Nothing further to add.  PCCM will sign off.  Please call us  back if we can be of any further assistance.   Best Practice (right click and Reselect all SmartList Selections daily)  Per primary   Labs   CBC: Recent Labs  Lab 06/05/23 1638 06/06/23 0557 06/07/23 0953  WBC 1.0* 0.7* 1.4*  NEUTROABS 0.4* 0.2* 1.0*  HGB 10.5* 9.5* 9.2*  HCT 32.5* 29.8* 29.1*  MCV 106.6* 108.4* 109.4*  PLT 109* 83* 42*    Basic Metabolic Panel: Recent Labs  Lab 06/05/23 1638 06/06/23 0557 06/07/23 0953 06/07/23 1418  NA 141 140 141  --   K 3.2* 3.7 3.8  --   CL 96* 95* 99  --   CO2 33* 34* 34*  --   GLUCOSE 116* 122* 196*  --   BUN 17 21* 28*  --   CREATININE 0.98 1.02* 1.33*  --   CALCIUM  10.1 9.9 10.2  --   MG  --  2.0  --  1.8   GFR: Estimated Creatinine Clearance: 60 mL/min (A) (by C-G formula based on SCr of 1.33 mg/dL (H)). Recent  Labs  Lab 06/05/23 1638 06/06/23 0557 06/07/23 0953  WBC 1.0* 0.7* 1.4*    Liver Function Tests: Recent Labs  Lab 06/06/23 0557 06/07/23 0953  AST 39 26  ALT 33 26  ALKPHOS 60 58  BILITOT 2.1* 1.3*  PROT 6.5 6.1*  ALBUMIN 2.7* 2.4*   No results for input(s): LIPASE, AMYLASE in the last 168 hours. No results for input(s): AMMONIA in the last 168 hours.  ABG    Component Value Date/Time   PHART 7.359 09/15/2018 1551   PCO2ART 64.9 (H) 09/15/2018 1551   PO2ART 195.0 (H) 09/15/2018 1551   HCO3 40.3 (H) 06/07/2023 0145   TCO2 39 (H) 09/15/2018 1551   O2SAT 95.8 06/07/2023 0145     Coagulation Profile: No results for input(s): INR, PROTIME in the last 168 hours.  Cardiac Enzymes: Recent Labs  Lab 06/06/23 0557  CKTOTAL 66    HbA1C: Hemoglobin A1C  Date/Time Value Ref Range Status  02/01/2023 01:23 PM 6.0 (A) 4.0 - 5.6 % Final  05/29/2017 12:00 AM 5.9  Final   HbA1c, POC (prediabetic range)  Date/Time Value Ref Range Status  02/01/2023 01:23 PM 6.0 5.7 - 6.4 % Final    HbA1c, POC (controlled diabetic range)  Date/Time Value Ref Range Status  02/01/2023 01:23 PM 6.0 0.0 - 7.0 % Final   HbA1c POC (<> result, manual entry)  Date/Time Value Ref Range Status  02/01/2023 01:23 PM 6.0 4.0 - 5.6 % Final   Hgb A1c MFr Bld  Date/Time Value Ref Range Status  10/18/2022 02:06 PM 6.3 4.6 - 6.5 % Final    Comment:    Glycemic Control Guidelines for People with Diabetes:Non Diabetic:  <6%Goal of Therapy: <7%Additional Action Suggested:  >8%   04/18/2022 02:14 PM 6.3 4.6 - 6.5 % Final    Comment:    Glycemic Control Guidelines for People with Diabetes:Non Diabetic:  <6%Goal of Therapy: <7%Additional Action Suggested:  >8%     CBG: No results for input(s): GLUCAP in the last 168 hours.  Review of Systems:   As per HPI otherwise neg  Past Medical History:  She,  has a past medical history of Acute on chronic respiratory failure with hypoxia and hypercapnia (HCC) (09/15/2018), Anemia, Anxiety, Arrhythmia, Arthritis, Bipolar 1 disorder (HCC) (09/12/2018), Bipolar I disorder, current or most recent episode manic, with psychotic features (HCC), Brief psychotic disorder (HCC) (08/22/2018), Chronic respiratory failure with hypoxia (HCC) (05/10/2018), Depression, Diverticulitis, Generalized edema (03/12/2018), GERD (gastroesophageal reflux disease), Glaucoma, Hyperlipidemia, Hyperparathyroidism (HCC), Hypertension, Inflammatory polyps of colon (HCC), Leg swelling (01/14/2019), LVH (left ventricular hypertrophy), Lymphedema, Morbid (severe) obesity due to excess calories (HCC) (05/11/2017), PCOS (polycystic ovarian syndrome), Peripheral vascular disease (HCC), Prediabetes, Recurrent UTI, Sleep apnea, Thyroid  disease, and Vitamin D  deficiency.   Surgical History:   Past Surgical History:  Procedure Laterality Date   BIOPSY  09/27/2022   Procedure: BIOPSY;  Surgeon: Wilhelmenia Aloha Raddle., MD;  Location: [facility] ENDOSCOPY;  Service: Gastroenterology;;   BREAST BIOPSY  2015    CESAREAN SECTION  2004   CHOLECYSTECTOMY     COLONOSCOPY WITH PROPOFOL  N/A 09/27/2022   Procedure: COLONOSCOPY WITH PROPOFOL ;  Surgeon: Wilhelmenia Aloha Raddle., MD;  Location: [facility] ENDOSCOPY;  Service: Gastroenterology;  Laterality: N/A;   ESOPHAGOGASTRODUODENOSCOPY (EGD) WITH PROPOFOL  N/A 09/27/2022   Procedure: ESOPHAGOGASTRODUODENOSCOPY (EGD) WITH PROPOFOL ;  Surgeon: Wilhelmenia Aloha Raddle., MD;  Location: [facility] ENDOSCOPY;  Service: Gastroenterology;  Laterality: N/A;   INNER EAR SURGERY     ear and sinus surgery  LAPAROSCOPIC REPAIR AND REMOVAL OF GASTRIC BAND     MALONEY DILATION  09/27/2022   Procedure: AGAPITO DILATION;  Surgeon: Wilhelmenia Aloha Raddle., MD;  Location: [facility] ENDOSCOPY;  Service: Gastroenterology;;   OOPHORECTOMY Left    POLYPECTOMY  09/27/2022   Procedure: POLYPECTOMY;  Surgeon: Wilhelmenia Aloha Raddle., MD;  Location: [facility] ENDOSCOPY;  Service: Gastroenterology;;   TONSILLECTOMY AND ADENOIDECTOMY       Social History:   reports that she has never smoked. She has never used smokeless tobacco. She reports current alcohol  use. She reports that she does not use drugs.   Family History:  Her family history includes Healthy in her son; Hearing loss in her son. There is no history of Breast cancer. She was adopted.   Allergies Allergies  Allergen Reactions   Other Other (See Comments)    Hydralazine  Hcl Other reaction(s): Other (See Comments) Instructed not to take by Cardiology.   Fentanyl  Hives    Other reaction(s): hives had fentanyl  recently with benadryl , did OK   Levofloxacin Hives   Midazolam  Hives   Pollen Extract     seasonal   Atorvastatin  Other (See Comments)    Muscle pain in legs  Abdominal pain   Hydralazine  Hcl Other (See Comments)    Hypercalcemia    Rosuvastatin Other (See Comments)    Abdominal pain Other reaction(s): diarrhea     Home Medications  Prior to Admission medications   Medication Sig Start Date End Date Taking? Authorizing Provider   acetaminophen  (TYLENOL ) 500 MG tablet Take 1,000 mg by mouth 3 (three) times daily as needed for moderate pain or headache.   Yes [provider]  amLODipine  (NORVASC ) 10 MG tablet TAKE 1 TABLET BY MOUTH EVERY DAY 04/10/23  Yes Hochrein, Lynwood, MD  atorvastatin  (LIPITOR) 20 MG tablet TAKE 1 TABLET(20 MG) BY MOUTH 3 TIMES A WEEK 04/17/23  Yes [name], Roselie Rockford, NP  benztropine  (COGENTIN ) 0.5 MG tablet Take 1 tablet (0.5 mg total) by mouth at bedtime. 04/10/23 07/09/23 Yes Arfeen, Leni DASEN, MD  Cholecalciferol  (VITAMIN D3 MAXIMUM STRENGTH) 125 MCG (5000 UT) capsule Take 5,000 Units by mouth daily.   Yes [provider]  esomeprazole  (NEXIUM ) 40 MG capsule TAKE 1 CAPSULE BY MOUTH TWICE DAILY 01/30/23  Yes Mansouraty, Gabriel Jr., MD  fenofibrate  (TRICOR ) 145 MG tablet TAKE 1 TABLET(145 MG) BY MOUTH DAILY 12/12/22  Yes [name], Roselie Rockford, NP  folic acid  (FOLVITE ) 1 MG tablet TAKE 1 TABLET BY MOUTH DAILY 04/03/23  Yes Heilingoetter, Cassandra L, PA-C  furosemide  (LASIX ) 40 MG tablet TAKE 1 TABLET BY MOUTH EVERY DAY 04/27/23  Yes Lavona Lynwood, MD  gabapentin  (NEURONTIN ) 100 MG capsule TAKE 2 CAPSULES(200 MG) BY MOUTH AT BEDTIME 07/18/22  Yes [name], Roselie Rockford, NP  labetalol  (NORMODYNE ) 300 MG tablet TAKE 1 AND 1/2 TABLETS BY MOUTH TWICE DAILY 03/21/23  Yes Lavona Lynwood, MD  levothyroxine  (SYNTHROID ) 88 MCG tablet Take 88 mcg by mouth daily. 07/28/21  Yes [provider]  lisinopril  (ZESTRIL ) 40 MG tablet TAKE 1 TABLET BY MOUTH EVERY DAY 12/26/22  Yes Lavona Lynwood, MD  lurasidone  (LATUDA ) 40 MG TABS tablet Take 1 tablet (40 mg total) by mouth daily with breakfast. 04/10/23  Yes Arfeen, Leni DASEN, MD  Polyethylene Glycol 3350  (MIRALAX  PO) Take 1 Capful by mouth daily as needed (Constipation).   Yes [provider]  Potassium Chloride  ER 20 MEQ TBCR TAKE 1 TABLET BY MOUTH EVERY DAY 05/08/23  Yes Lavona Lynwood, MD  prochlorperazine  (COMPAZINE ) 10  MG tablet Take 1 tablet  (10 mg total) by mouth every 6 (six) hours as needed. 09/28/22  Yes Heilingoetter, Cassandra L, PA-C  TART CHERRY PO Take 1 tablet by mouth daily.   Yes [provider]  cetirizine  (ZYRTEC ) 10 MG tablet TAKE 1 TABLET BY MOUTH EVERY DAY Patient not taking: Reported on 06/06/2023 12/21/22   [name], Roselie Rockford, NP     Critical care time: n/a       Lyle Pesa, MSN, AG-ACNP-BC [facility] 06/07/2023, 5:52 PM  See Amion for pager If no response to pager , please call [phone] until 7pm After 7:00 pm call Elink  [phone]

## 2023-06-07 NOTE — Progress Notes (Signed)
  Inpatient Rehab Admissions Coordinator :  Per OT therapy recommendations, patient was screened for CIR candidacy by Ottie Glazier RN MSN.  At this time patient appears to be a potential candidate for CIR. I will place a rehab consult per protocol for full assessment. Please call me with any questions.  Ottie Glazier RN MSN Admissions Coordinator [phone]

## 2023-06-07 NOTE — Plan of Care (Signed)
  Problem: Education: Goal: Knowledge of General Education information will improve Description: Including pain rating scale, medication(s)/side effects and non-pharmacologic comfort measures Outcome: Progressing   Problem: Clinical Measurements: Goal: Ability to maintain clinical measurements within normal limits will improve Outcome: Progressing Goal: Respiratory complications will improve Outcome: Progressing   Problem: Pain Managment: Goal: General experience of comfort will improve and/or be controlled Outcome: Progressing   Problem: Safety: Goal: Ability to remain free from injury will improve Outcome: Progressing

## 2023-06-07 NOTE — Progress Notes (Signed)
 PROGRESS NOTE    Madison Palmer  FMW:[id number] DOB: Jul 22, 1965 DOA: 06/05/2023 PCP: Katheen Roselie Rockford, NP   Brief Narrative: 58 year old with past medical history significant for stage IV lung cancer on chemotherapy, and Keytruda  last dose was 05/29/2023, history of diastolic heart failure, hypertension, hyperlipidemia, sleep apnea, depression, hypothyroidism, who presented with progressive lower extremity weakness over the last 2 weeks.  Patient states that her weakness was initially on the right side and in the last few days presents on both lower extremities.  Patient recently had follow-up with oncologist Dr. Gatha on 1/28 when MRI of the brain was planted.  MRI of the brain 1/21st was negative for acute finding.   Assessment & Plan:   Principal Problem:   Lower extremity weakness Active Problems:   Hypothyroidism   Obstructive sleep apnea   Essential hypertension   Chronic diastolic HF (heart failure) (HCC)   Class 3 severe obesity with serious comorbidity and body mass index (BMI) of 50.0 to 59.9 in adult [facility])   Adenocarcinoma of left lung, stage 4 (HCC)   Weakness   Neutropenia (HCC)   Neuropathy   Macrocytic anemia   Thrombocytopenia (HCC)   1-Bilateral lower extremity weakness:  -Admitted discussed case with on-call neurology, who thought her symptoms were more related to deconditioning. -MRI brain: no acute finding.  -MR Lumbar:  7 mm lesion within the S1 segment, with additional 8 mm lesion within the left sacral ala. Given history of metastatic non-small cell lung cancer, findings are concerning for small osseous metastases. No extra osseous or epidural tumor. Right foraminal to extraforaminal disc protrusion at L2-3, potentially affecting the exiting right L2 nerve root. Moderate to advanced lower lumbar facet hypertrophy, which could contribute to lower back pain.  -weakness improving.  -Discussed MRI spine with Dr Tressia, no surgical intervention needed, Out  patient follow up.   2-Neutropenia, History of stage IV Lung cancer;  -Neutropenia in the setting of recent chemotherapy Cussed with Dr. Gatha plan to continue with Granix  on the ANC more than 1500 Improving.   3-Left pleural effusion concern with malignancy versus loculated -Discussed with Dr. Gatha we can proceed with thoracentesis when white blood cell counts improve -Continue  IV antibiotics to cover for infectious process -IV cefepime  and vancomycin   -Worsening SOB this afternoon, oxygen requirement increase to 4 L.  -Plan for Stat Chest x ray/  4-panniculitis, pubic area with redness and sluggish skin.  Concern with superimposed bacterial cellulitis -Continue with  IV antibiotics -Nystatin  powder  Hypertension: Continue with labetalol , lisinopril  and amlodipine  Abnormal rhythm on monitor per nurse. Plan to check EKG, IV mg, check Mg level.   Hyperlipidemia: Continue with the statins and fenofibrate   Lipid apnea, continue with CPAP  Normocytic  anemia: Follow anemia panel  thrombocytopenia in the setting of chemotherapy  Diastolic heart failure chronic compensated -resume lasix  tomorrow  Depression: Continue Latuda  and Cogentin  Anxiety, agitation last night; had haldol .   Neuropathy: Continue with gabapentin  Mild hypokalemia: Replace    Estimated body mass index is 51.85 kg/m as calculated from the following:   Height as of this encounter: 5' 2 (1.575 m).   Weight as of this encounter: 128.6 kg.   DVT prophylaxis: SCDs Code Status: Full code Family Communication: Care discussed with patient Disposition Plan:  Status is: Observation The patient will require care spanning > 2 midnights and should be moved to inpatient because: Management of neutropenia, weakness pleural effusion    Consultants:  Oncology   Procedures:  none  Antimicrobials:    Subjective: Seem this morning, report LE weakness improved.  This afternoon develops worsening  Dyspnea, , oxygen increased to 4 l for comfort. Cam to check on her report dyspnea improving. Denies chest pain.   Objective: Vitals:   06/06/23 2006 06/06/23 2105 06/07/23 0621 06/07/23 1236  BP: 125/78 125/78 131/85 (!) 112/58  Pulse: 92 92 73 86  Resp: 18  18 20   Temp: 98 F (36.7 C)  97.6 F (36.4 C) 98.3 F (36.8 C)  TempSrc:      SpO2: 99%  100% 98%  Weight:      Height:        Intake/Output Summary (Last 24 hours) at 06/07/2023 1349 Last data filed at 06/07/2023 1326 Gross per 24 hour  Intake 238 ml  Output 1400 ml  Net -1162 ml   Filed Weights   06/05/23 1621 06/06/23 1605  Weight: 127.6 kg 128.6 kg    Examination:  General exam: NAD Respiratory system: Normal Respiratory effort, BL  air movement Cardiovascular system: S 1, S 2 RRR Gastrointestinal system: BS present, soft nt Central nervous system: alert, follows command. Moves all 4 extremities.  Extremities: She is able to move bilateral lower lower extremities   Data Reviewed: I have personally reviewed following labs and imaging studies  CBC: Recent Labs  Lab 06/05/23 1638 06/06/23 0557 06/07/23 0953  WBC 1.0* 0.7* 1.4*  NEUTROABS 0.4* 0.2* 1.0*  HGB 10.5* 9.5* 9.2*  HCT 32.5* 29.8* 29.1*  MCV 106.6* 108.4* 109.4*  PLT 109* 83* 42*   Basic Metabolic Panel: Recent Labs  Lab 06/05/23 1638 06/06/23 0557 06/07/23 0953  NA 141 140 141  K 3.2* 3.7 3.8  CL 96* 95* 99  CO2 33* 34* 34*  GLUCOSE 116* 122* 196*  BUN 17 21* 28*  CREATININE 0.98 1.02* 1.33*  CALCIUM  10.1 9.9 10.2  MG  --  2.0  --    GFR: Estimated Creatinine Clearance: 60 mL/min (A) (by C-G formula based on SCr of 1.33 mg/dL (H)). Liver Function Tests: Recent Labs  Lab 06/06/23 0557 06/07/23 0953  AST 39 26  ALT 33 26  ALKPHOS 60 58  BILITOT 2.1* 1.3*  PROT 6.5 6.1*  ALBUMIN 2.7* 2.4*   No results for input(s): LIPASE, AMYLASE in the last 168 hours. No results for input(s): AMMONIA in the last 168  hours. Coagulation Profile: No results for input(s): INR, PROTIME in the last 168 hours. Cardiac Enzymes: Recent Labs  Lab 06/06/23 0557  CKTOTAL 66   BNP (last 3 results) No results for input(s): PROBNP in the last 8760 hours. HbA1C: No results for input(s): HGBA1C in the last 72 hours. CBG: No results for input(s): GLUCAP in the last 168 hours. Lipid Profile: No results for input(s): CHOL, HDL, LDLCALC, TRIG, CHOLHDL, LDLDIRECT in the last 72 hours. Thyroid  Function Tests: Recent Labs    06/06/23 0557  TSH 0.769   Anemia Panel: Recent Labs    06/06/23 0557  VITAMINB12 1,358*  FOLATE 18.0  FERRITIN 1,468*  TIBC 259  IRON 210*  RETICCTPCT <0.4*   Sepsis Labs: No results for input(s): PROCALCITON, LATICACIDVEN in the last 168 hours.  Recent Results (from the past 240 hours)  Culture, blood (Routine X 2) w Reflex to ID Panel     Status: None (Preliminary result)   Collection Time: 06/06/23  8:55 AM   Specimen: BLOOD LEFT WRIST  Result Value Ref Range Status   Specimen Description   Final  BLOOD LEFT WRIST Performed at [facility], [address] [place], [place] [address]    Special Requests   Final    BOTTLES DRAWN AEROBIC AND ANAEROBIC Blood Culture results may not be optimal due to an inadequate volume of blood received in culture bottles Performed at [facility], [address], [place], [place] [address]    Culture   Final    NO GROWTH < 24 HOURS Performed at [facility], [address], [place], [place] [address]    Report Status PENDING  Incomplete  Culture, blood (Routine X 2) w Reflex to ID Panel     Status: None (Preliminary result)   Collection Time: 06/06/23  8:58 AM   Specimen: BLOOD RIGHT WRIST  Result Value Ref Range Status   Specimen Description   Final    BLOOD RIGHT WRIST Performed at [facility], [address] [place], [place] [address]    Special Requests   Final     BOTTLES DRAWN AEROBIC AND ANAEROBIC Blood Culture results may not be optimal due to an inadequate volume of blood received in culture bottles Performed at [facility], [address], [place], [place] [address]    Culture   Final    NO GROWTH < 24 HOURS Performed at [facility], [address] [place], [place] [address]    Report Status PENDING  Incomplete  MRSA Next Gen by PCR, Nasal     Status: None   Collection Time: 06/06/23  5:39 PM   Specimen: Nasal Mucosa; Nasal Swab  Result Value Ref Range Status   MRSA by PCR Next Gen NOT DETECTED NOT DETECTED Final    Comment: (NOTE) The GeneXpert MRSA Assay (FDA approved for NASAL specimens only), is one component of a comprehensive MRSA colonization surveillance program. It is not intended to diagnose MRSA infection nor to guide or monitor treatment for MRSA infections. Test performance is not FDA approved in patients less than 63 years old. Performed at [facility], [address] [place], [place] [address]          Radiology Studies: MR BRAIN WO CONTRAST Result Date: 06/07/2023 CLINICAL DATA:  Neuro deficit, acute, stroke suspected. EXAM: MRI HEAD WITHOUT CONTRAST TECHNIQUE: Multiplanar, multiecho pulse sequences of the brain and surrounding structures were obtained without intravenous contrast. COMPARISON:  MRI of the brain May 22, 2023. FINDINGS: Incomplete study due to patient inability to lie flat in the scanner for the duration of the study. Images obtained are partially degraded by motion. Brain: No acute infarction, hemorrhage, hydrocephalus, extra-axial collection or mass lesion. Vascular: Normal flow voids. Skull and upper cervical spine: Decreased T1 signal of calvarium, likely related to history of anemia. Sinuses/Orbits: Mild mucosal thickening in on the right maxillary sinus. The orbits are grossly unremarkable. Other: None. IMPRESSION: 1. Incomplete study. Images obtained  are partially degraded by motion. 2. No acute intracranial abnormality identified. Electronically Signed   By: Katyucia  de Macedo Rodrigues M.D.   On: 06/07/2023 10:08   CT Angio Chest PE W and/or Wo Contrast Result Date: 06/06/2023 CLINICAL DATA:  Increasing weakness following chemotherapy last week. History of non-small cell lung cancer, chronic respiratory distress. Portable chest obtained prior to CT. EXAM: CT ANGIOGRAPHY CHEST WITH CONTRAST TECHNIQUE: Multidetector CT imaging of the chest was performed using the standard protocol during bolus administration of intravenous contrast. Multiplanar CT image reconstructions and MIPs were obtained to evaluate the vascular  anatomy. RADIATION DOSE REDUCTION: This exam was performed according to the departmental dose-optimization program which includes automated exposure control, adjustment of the mA and/or kV according to patient size and/or use of iterative reconstruction technique. CONTRAST:  OMNIPAQUE  IOHEXOL  350 MG/ML SOLN COMPARISON:  Chest CT with contrast 05/22/2023, PET-CT 03/15/2023. FINDINGS: Cardiovascular: There is moderate left main and three-vessel coronary artery calcific plaque. Mild cardiomegaly with a left chamber predominance and mild aortic atherosclerosis. There is no aortic or great vessel aneurysm, dissection or stenosis. The pulmonary arteries and veins are normal in caliber without evidence for arterial embolic disease. There is no pericardial effusion. Mediastinum/Nodes: No new adenopathy. The subcarinal lymph node measuring 1.6 cm in short axis was previously 1.5 cm. A left hilar lymph node is again noted measuring 1.4 cm in short axis on 4:59, previously 1.4 cm. There is a stable borderline prominent low right paratracheal space lymph node. Axillary spaces are clear. The trachea, main bronchi, thoracic esophagus are unremarkable. Lungs/Pleura: There is increasing now moderate sized left pleural effusion, some of which appears to be  at least partially loculated in the anterior lower chest. There is no right-sided effusion. Some of the left pleural fluid tracks into the inferior interlobar fissure, more than previously. There are numerous diffuse metastatic lung nodules bilaterally. Again, the largest is in the medial left lower lobe base measuring 4.9 cm on 12:82, previously 4.1 cm when measured in similar fashion. Index right lower lobe perihilar lesion on 12:65 is stable in size at 12 mm, masses another right lower lobe nodule measuring 1.7 cm on 12:82. Others are much smaller. Rest of the lungs exhibit mosaicism consistent with air trapping and small airways disease, without appreciable focal pneumonia. Upper Abdomen: Diffuse innumerable hepatic metastases were better demonstrated on prior CT. In the phase of contrast in which the liver was imaged today it is difficult to distinguish individual metastases. Left adrenal nodule is stable. Minimal perihepatic ascites with no acute upper abdominal findings. Musculoskeletal: Bridging enthesopathy multiple thoracic spine levels. No destructive bone lesions in the thorax. Review of the MIP images confirms the above findings. IMPRESSION: 1. No evidence of arterial embolic disease. 2. Increasing now moderate sized left pleural effusion, some of which appears to be at least partially loculated in the anterior lower chest. 3. Numerous diffuse metastatic lung nodules bilaterally, the largest in the medial left lower lobe base measuring 4.9 cm, previously 4.1 cm. 4. Stable mediastinal and left hilar adenopathy. 5. Diffuse innumerable hepatic metastases were better demonstrated on prior CT. In the phase of contrast in which the liver was imaged today it is difficult to distinguish individual metastases. 6. Stable left adrenal nodule. 7. Aortic and coronary artery atherosclerosis. Mild cardiomegaly. 8. Mosaic lung attenuation consistent with air trapping and small airways disease. Aortic Atherosclerosis  (ICD10-I70.0). Electronically Signed   By: Francis Quam M.D.   On: 06/06/2023 04:14   DG Chest Portable 1 View Result Date: 06/06/2023 CLINICAL DATA:  Shortness of breath. History of lung cancer with chemotherapy every 3 weeks. Recent chemotherapy last week. Increasing weakness over the last few days. EXAM: PORTABLE CHEST 1 VIEW COMPARISON:  CT chest abdomen and pelvis 05/22/2023. FINDINGS: Shallow inspiration. Mild cardiac enlargement. Infiltration or atelectasis in the left lung base with small left pleural effusion. This could be edema or pneumonia. Fine nodular changes in the lungs are better seen on prior CT. No pneumothorax. Degenerative changes in the spine. IMPRESSION: Cardiac enlargement. Small left pleural effusion with basilar atelectasis or infiltration.  Known pulmonary nodules are better seen on prior CT. Electronically Signed   By: Elsie Gravely M.D.   On: 06/06/2023 00:42   MR Lumbar Spine W Wo Contrast Result Date: 06/05/2023 CLINICAL DATA:  Initial evaluation for acute myelopathy. History of metastatic non-small cell lung cancer. EXAM: MRI LUMBAR SPINE WITHOUT AND WITH CONTRAST TECHNIQUE: Multiplanar and multiecho pulse sequences of the lumbar spine were obtained without and with intravenous contrast. CONTRAST:  10mL GADAVIST  GADOBUTROL  1 MMOL/ML IV SOLN COMPARISON:  Comparison made with prior CT from 05/22/2023 and PET-CT from 03/15/2023 FINDINGS: Segmentation: Standard. Lowest well-formed disc space labeled the L5-S1 level. Alignment: 3 mm degenerative retrolisthesis of L5 on S1, with trace 2 mm anterolisthesis of L4 on L5. Findings chronic and facet mediated. Alignment otherwise normal preservation of the normal lumbar lordosis. Vertebrae: Vertebral body height maintained without acute or chronic fracture. Bone marrow signal intensity within normal limits. 7 mm lesion present within the S1 segment, with additional 8 mm lesion within the left sacral ala (series 11, images 9, 14). These  lesions demonstrate hypointense precontrast T1 signal intensity, STIR hyperintensity, with postcontrast enhancement. Given history of metastatic non-small cell lung cancer, findings are concerning for small osseous metastases. No other discrete or worrisome osseous lesions. No extra osseous or epidural tumor. Conus medullaris and cauda equina: Conus extends to the L2 level. Conus and cauda equina appear normal. No abnormal enhancement. Paraspinal and other soft tissues: Paraspinous soft tissues demonstrate no acute finding. Innumerable hepatic metastases noted within the visualized liver. Disc levels: L1-2: Normal interspace. Mild bilateral facet hypertrophy. No spinal stenosis. Foramina remain patent. L2-3: Disc desiccation with diffuse disc bulge. Superimposed right foraminal to extraforaminal disc protrusion contacts the exiting right L2 nerve root (series 9, image 17). Moderate right with mild-to-moderate left facet hypertrophy. Resultant mild canal with moderate right lateral recess stenosis. Mild to moderate right L2 foraminal narrowing. Left neural foramina remains patent. L3-4: Disc desiccation without significant disc bulge. Moderate facet and ligament flavum hypertrophy. No significant spinal stenosis. Foramina remain patent. L4-5: Disc desiccation without significant disc bulge. Moderate to severe bilateral facet arthrosis. Resultant mild canal with bilateral subarticular stenosis. Mild to moderate bilateral L4 foraminal stenosis. L5-S1: Degenerative vertebral disc space narrowing with disc desiccation and diffuse disc bulge. Reactive endplate spurring. Small central annular fissure. Mild facet hypertrophy. No significant spinal stenosis. Moderate bilateral L5 foraminal narrowing. IMPRESSION: 1. No acute abnormality within the lumbar spine. No high-grade spinal stenosis or evidence for cord compression. 2. 7 mm lesion within the S1 segment, with additional 8 mm lesion within the left sacral ala. Given  history of metastatic non-small cell lung cancer, findings are concerning for small osseous metastases. No extra osseous or epidural tumor. 3. Right foraminal to extraforaminal disc protrusion at L2-3, potentially affecting the exiting right L2 nerve root. 4. Moderate to advanced lower lumbar facet hypertrophy, which could contribute to lower back pain. 5. Innumerable hepatic metastases within the visualized liver. Electronically Signed   By: Morene Hoard M.D.   On: 06/05/2023 22:43        Scheduled Meds:  amLODipine   10 mg Oral Daily   atorvastatin   20 mg Oral Q M,W,F   benztropine   0.5 mg Oral QHS   cholecalciferol   5,000 Units Oral Daily   fenofibrate   160 mg Oral Daily   folic acid   1 mg Oral Daily   gabapentin   200 mg Oral QHS   labetalol   450 mg Oral BID   levothyroxine   88 mcg  Oral Q0600   lisinopril   40 mg Oral Daily   lurasidone   40 mg Oral Q breakfast   nystatin    Topical TID   pantoprazole   40 mg Oral Daily   Tbo-Filgrastim   480 mcg Subcutaneous ONCE-1800   Continuous Infusions:  ceFEPime  (MAXIPIME ) IV 2 g (06/07/23 0555)   vancomycin  1,500 mg (06/06/23 1732)     LOS: 1 day    Time spent: 35 minutes    Edye Hainline A Nickol Collister, MD [facility]   If 7PM-7AM, please contact night-coverage [email or web address]  06/07/2023, 1:49 PM

## 2023-06-07 NOTE — Progress Notes (Signed)
     Patient Name: Madison Palmer           DOB: 01-08-1966  MRN: [id number]      Admission Date: 06/05/2023  Attending Provider: Madelyne Owen LABOR, MD  Primary Diagnosis: Lower extremity weakness   Level of care: Telemetry   OVERNIGHT PROGRESS REPORT   Mild confusion, anxious, restless.   Getting out of bed and states she wants to go home.   She was found without oxygen earlier, but is now using 2L [place] and is satting 98%.    Plan:  Orders placed for melatonin, 1 mg Haldol .   VBG.     Collin Rengel, DNP, ACNPC- AG [facility] [place]

## 2023-06-07 NOTE — Progress Notes (Signed)
 Physical Therapy Treatment Patient Details Name: Madison Palmer MRN: [id number] DOB: [birth year] Today's Date: 06/07/2023   History of Present Illness Madison Palmer is a 58 y.o. female presents to ED 2//25  with progressive Lower extremity weakness, SOB,  CT angiogram of the chest was negative for PE but did show a moderate-sized left pleural effusion with  also evidence of metastatic disease. Labs show mild hypokalemia macrocytic anemia and neutropenia.  Madison Palmer:dujhz IV lung cancer on chemotherapy and Keytruda  last dose was on 05/29/2023 with history of diastolic CHF, hypertension, hyperlipidemia, sleep apnea, depression, hypothyroidism, morbid obesity    PT Comments  Co Tx with OT Eval Pt in bed on 2 lts sats 97%.  Trial RA avg 94% with activity.  Pt AxO x 3 pleasant and willing but present with increased anxiety as well.  Assisted wit EOB was difficult.  General bed mobility comments: assist with legs and trunk to sitting this was painful for the patient today (upper ABD below breats)   Increased anxiety.  Trial RA 92%.  General transfer comment: rocking momentum and A used to achieve full upright, PT benefits from external/environmental support for balance and physical support.  Transfered from elevated bed to [facility] then from [facility] to stand with walker as Therapist switch BSC with recliner fom behind.  Increased anxiety, pt repeating can I sit down. Pt was able to support her weight.  C/O B LE weakness and fear of falling.  Reapplied 2 lts and positioned to comfort.   Pt lives home alone.  OT rec CIR.  After session consulted LPT also rec CIR    If plan is discharge home, recommend the following: A little help with walking and/or transfers;Assistance with cooking/housework;Assist for transportation;Help with stairs or ramp for entrance   Can travel by private vehicle        Equipment Recommendations  Rolling walker (2 wheels)    Recommendations for Other Services       Precautions /  Restrictions Precautions Precautions: Fall Precaution Comments: new O2 needs/monitor sats Restrictions Weight Bearing Restrictions Per Provider Order: No     Mobility  Bed Mobility Overal bed mobility: Needs Assistance Bed Mobility: Supine to Sit     Supine to sit: Mod assist, +2 for safety/equipment     General bed mobility comments: assist with legs and trunk to sitting this was painful for the patient today (upper ABD below breats)   Increased anxiety.  Trial RA 92%.    Transfers Overall transfer level: Needs assistance Equipment used: Rolling walker (2 wheels), None Transfers: Sit to/from Stand, Bed to chair/wheelchair/BSC Sit to Stand: +2 safety/equipment, Min assist, From elevated surface   Step pivot transfers: Min assist, +2 safety/equipment, From elevated surface       General transfer comment: rocking momentum and A used to achieve full upright, PT benefits from external/environmental support for balance and physical support.  Transfered from elevated bed to [facility] then from [facility] to stand with walker as Therapist switch BSC with recliner fom behind.  Increased anxiety, pt repeating can I sit down.    Ambulation/Gait               General Gait Details: transfer only this session   Stairs             Wheelchair Mobility     Tilt Bed    Modified Rankin (Stroke Patients Only)       Balance  Cognition Arousal: Alert Behavior During Therapy: Flat affect Overall Cognitive Status: Difficult to assess                                 General Comments: Pt AxO x 3 emotional and very nervous/anxious.  Trembling.  Following all instructions and able to express needs.  did you know I have Cancer? pt crying.        Exercises      General Comments General comments (skin integrity, edema, etc.): no family present today. HR remained 100-115 and SPO2 >90% on RA with  activity      Pertinent Vitals/Pain Pain Assessment Pain Assessment: Faces Faces Pain Scale: Hurts even more Pain Location: upper ABD just below breats Pain Descriptors / Indicators: Discomfort, Grimacing Pain Intervention(s): Monitored during session    Home Living Family/patient expects to be discharged to:: Private residence Living Arrangements: Children Available Help at Discharge: Family;Available PRN/intermittently Type of Home: House Home Access: Stairs to enter   Entrance Stairs-Number of Steps: thresshold   Home Layout: One level Home Equipment: [place] - single point Additional Comments: patient states that she lives with son and drives to [place] daily  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    Prior Function            PT Goals (current goals can now be found in the care plan section) Progress towards PT goals: Progressing toward goals    Frequency    Min 1X/week      PT Plan      Co-evaluation   Reason for Co-Treatment: For patient/therapist safety;To address functional/ADL transfers PT goals addressed during session: Mobility/safety with mobility;Balance;Proper use of DME OT goals addressed during session: ADL's and self-care;Strengthening/ROM      AM-PAC PT 6 Clicks Mobility   Outcome Measure  Help needed turning from your back to your side while in a flat bed without using bedrails?: A Lot Help needed moving from lying on your back to sitting on the side of a flat bed without using bedrails?: A Lot Help needed moving to and from a bed to a chair (including a wheelchair)?: A Lot Help needed standing up from a chair using your arms (e.g., wheelchair or bedside chair)?: A Lot Help needed to walk in hospital room?: A Lot Help needed climbing 3-5 steps with a railing? : Total 6 Click Score: 11    End of Session Equipment Utilized During Treatment: Gait belt Activity Tolerance: Patient limited by fatigue Patient left: in chair;with call  bell/phone within reach Nurse Communication: Mobility status PT Visit Diagnosis: Unsteadiness on feet (R26.81);Muscle weakness (generalized) (M62.81);Difficulty in walking, not elsewhere classified (R26.2);Pain     Time: 8976-8948 PT Time Calculation (min) (ACUTE ONLY): 28 min  Charges:    $Therapeutic Activity: 23-37 mins PT General Charges $$ ACUTE PT VISIT: 1 Visit                     [name]  PTA Acute  Rehabilitation Services Office M-F          [phone]

## 2023-06-07 NOTE — Progress Notes (Signed)
   06/07/23 0004  BiPAP/CPAP/SIPAP  BiPAP/CPAP/SIPAP Pt Type Adult  BiPAP/CPAP/SIPAP Resmed  Mask Type Nasal mask  Mask Size Medium  EPAP 9 cmH2O  Flow Rate 2 lpm  Patient Home Equipment No  Auto Titrate No

## 2023-06-07 NOTE — Progress Notes (Signed)
   06/07/23 2321  BiPAP/CPAP/SIPAP  BiPAP/CPAP/SIPAP Pt Type Adult  BiPAP/CPAP/SIPAP Resmed  Mask Type Nasal mask  Mask Size Medium  EPAP 9 cmH2O  Flow Rate 2 lpm  Patient Home Equipment No  Auto Titrate No

## 2023-06-07 NOTE — TOC Initial Note (Signed)
 Transition of Care [facility]) - Initial/Assessment Note    Patient Details  Name: Madison Palmer MRN: [id number] Date of Birth: [birth year]  Transition of Care [facility]) CM/SW Contact:    Hoy DELENA Bigness, LCSW Phone Number: 06/07/2023, 1:55 PM  Clinical Narrative:                 Pt from home w/ son. Pt recommended for CIR. CIR currently evaluating for eligibility. TOC will follow.   Expected Discharge Plan: IP Rehab Facility Barriers to Discharge: No Barriers Identified   Patient Goals and CMS Choice Patient states their goals for this hospitalization and ongoing recovery are:: To go to rehab          Expected Discharge Plan and Services In-house Referral: Clinical Social Work Discharge Planning Services: NA Post Acute Care Choice: IP Rehab Living arrangements for the past 2 months: Single Family Home                                      Prior Living Arrangements/Services Living arrangements for the past 2 months: Single Family Home Lives with:: Adult Children Patient language and need for interpreter reviewed:: Yes Do you feel safe going back to the place where you live?: Yes      Need for Family Participation in Patient Care: No (Comment) Care giver support system in place?: Yes (comment) Current home services: DME Criminal Activity/Legal Involvement Pertinent to Current Situation/Hospitalization: No - Comment as needed  Activities of Daily Living   ADL Screening (condition at time of admission) Independently performs ADLs?: No Does the patient have a NEW difficulty with bathing/dressing/toileting/self-feeding that is expected to last >3 days?: No Does the patient have a NEW difficulty with getting in/out of bed, walking, or climbing stairs that is expected to last >3 days?: Yes (Initiates electronic notice to provider for possible PT consult) Does the patient have a NEW difficulty with communication that is expected to last >3 days?: No Is the patient deaf or  have difficulty hearing?: No Does the patient have difficulty seeing, even when wearing glasses/contacts?: Yes Does the patient have difficulty concentrating, remembering, or making decisions?: No  Permission Sought/Granted Permission sought to share information with : Facility Industrial/product Designer granted to share information with : Yes, Verbal Permission Granted              Emotional Assessment Appearance:: Appears stated age Attitude/Demeanor/Rapport: Engaged Affect (typically observed): Accepting Orientation: : Oriented to Self, Oriented to Place, Oriented to  Time, Oriented to Situation Alcohol  / Substance Use: Not Applicable Psych Involvement: No (comment)  Admission diagnosis:  Weakness [R53.1] Other neutropenia (HCC) [D70.8] Lower extremity weakness [R29.898] Patient Active Problem List   Diagnosis Date Noted   Lower extremity weakness 06/06/2023   Neutropenia (HCC) 06/06/2023   Neuropathy 06/06/2023   Macrocytic anemia 06/06/2023   Thrombocytopenia (HCC) 06/06/2023   Weakness 06/05/2023   Dermatitis 04/03/2023   Encounter for antineoplastic chemotherapy 03/27/2023   Cellulitis of right lower extremity 02/01/2023   Urinary hesitancy 02/01/2023   Conductive hearing loss in left ear 02/01/2023   Impacted cerumen of left ear 02/01/2023   Other specified disorders of eustachian tube, bilateral 02/01/2023   Neutropenia, drug-induced (HCC) 12/19/2022   Encounter for antineoplastic immunotherapy 10/10/2022   Metastatic lung cancer (metastasis from lung to other site) (HCC) 09/28/2022   Goals of care, counseling/discussion 09/28/2022   Adenocarcinoma of left lung, stage  4 (HCC) 09/28/2022   Lesion of liver 08/28/2022   Fecal urgency 08/19/2022   Change in bowel habits 08/19/2022   Hearing loss 10/28/2021   Pyrosis 06/22/2021   Hx of adenomatous colonic polyps 06/22/2021   Cervical disc disorder with radiculopathy of cervical region 11/17/2020    Schizoaffective disorder, bipolar type (HCC) 10/07/2020   DM (diabetes mellitus) (HCC) 10/07/2020   Somatic dysfunction of spine, cervical 10/06/2020   Polyphagia 09/07/2020   Restrictive lung disease 08/11/2020   Elevated coronary artery calcium  score 01/14/2019   Class 3 severe obesity with serious comorbidity and body mass index (BMI) of 50.0 to 59.9 in adult (HCC) 09/05/2018   Adenomatous polyp 08/18/2018   Chronic diastolic HF (heart failure) (HCC) 05/15/2018   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   DDD (degenerative disc disease), lumbar 03/21/2018   Primary osteoarthritis of both knees 03/21/2018   Dysphagia 03/19/2018   PVC (premature ventricular contraction) 03/12/2018   LVH (left ventricular hypertrophy) 03/12/2018   Bilateral carotid artery stenosis 03/12/2018   Hemorrhoids 01/05/2018   Hiatal hernia 01/05/2018   Esophageal dysmotilities 01/05/2018   Hyperlipidemia associated with type 2 diabetes mellitus (HCC) 09/12/2017   Hyperparathyroidism (HCC) 06/13/2017   Vitamin D  deficiency 06/13/2017   History of colonic polyps 05/15/2017   Eustachian tube dysfunction, right 05/15/2017   Lymphatic edema 05/15/2017   Anemia 05/11/2017   Arrhythmia 05/11/2017   Diverticulosis 05/11/2017   GERD (gastroesophageal reflux disease) 05/11/2017   Hypothyroidism 05/11/2017   PCOS (polycystic ovarian syndrome) 05/11/2017   Obstructive sleep apnea 05/11/2017   Neck pain 05/11/2017   Allergic rhinitis 05/11/2017   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/2012   Essential hypertension 05/01/1990   PCP:  Katheen Roselie Rockford, NP Pharmacy:   [id number] [place], [place] - [address] AT [facility] [address] [place]  [address] Phone: [phone] Fax: [phone]     Social Drivers of Health (SDOH) Social History: SDOH Screenings   Food Insecurity: No Food Insecurity (06/06/2023)   Housing: Unknown (06/06/2023)  Transportation Needs: No Transportation Needs (06/06/2023)  Utilities: Not At Risk (06/06/2023)  Alcohol  Screen: Low Risk  (09/13/2018)  Depression (PHQ2-9): Low Risk  (03/15/2023)  Financial Resource Strain: Low Risk  (07/03/2022)  Physical Activity: Inactive (07/03/2022)  Social Connections: Unknown (11/11/2018)   [facility]  Stress: No Stress Concern Present (07/03/2022)  Tobacco Use: Low Risk  (06/06/2023)   SDOH Interventions:     Readmission Risk Interventions    06/07/2023    1:53 PM  Readmission Risk Prevention Plan  Transportation Screening Complete  PCP or Specialist Appt within 5-7 Days Complete  Home Care Screening Complete  Medication Review (RN CM) Complete

## 2023-06-07 NOTE — Progress Notes (Signed)
Physical Therapy Treatment Patient Details Name: Madison Palmer MRN: [id number] DOB: 24-Sep-1965 Today's Date: 06/07/2023   History of Present Illness Madison Palmer is a 58 y.o. female presents to ED 2//25  with progressive Lower extremity weakness, SOB,  CT angiogram of the chest was negative for PE but did show a moderate-sized left pleural effusion with  also evidence of metastatic disease. Labs show mild hypokalemia macrocytic anemia and neutropenia.  EFY:dujhz IV lung cancer on chemotherapy and Keytruda  last dose was on 05/29/2023 with history of diastolic CHF, hypertension, hyperlipidemia, sleep apnea, depression, hypothyroidism, morbid obesity    PT Comments  General Comments: Pt AxO x 3 emotional and very nervous/anxious.  Trembling.  Following all instructions and able to express needs. Encouraging BSC during day and purewick HS.  Called to room to assist with [facility] transfer as Pt was not wanting to get OOB to do.  General bed mobility comments: pt requesting to go to the bathroom so assisted from supine to EOB with Mod/Max Assist for upper body while pt was self able to swing legs around with increased time and effort to complete scooting to EOB. General transfer comment: pt was able to stand pivot from elevated bed to 1/4 turn BSC NO AD but did use B UE's to support self and forward weight shift to rise (body habitus) able to complete full turn onto center of BSC.  Pt voided.  Then assist with sit to stand and performed assistance with peri care as pt was unable to slight instability needing to support.  Pt was able to complete another 1/4 from [facility] back to bed again NO AD.  Self supporting using B UE's and forwad weight shift due to body habitus.  Tolerated well.  Less anxiety the second time.  Per Pt request, I left pt sitting on EOB with call light in reach and door open.   Pt is VERY deconditioned but has good Rehab potential.  Rec have been udpated for CIR   If plan is discharge home,  recommend the following: A little help with walking and/or transfers;Assistance with cooking/housework;Assist for transportation;Help with stairs or ramp for entrance   Can travel by private vehicle        Equipment Recommendations  Rolling walker (2 wheels)    Recommendations for Other Services       Precautions / Restrictions Precautions Precautions: Fall Precaution Comments: new O2 needs/monitor sats Restrictions Weight Bearing Restrictions Per Provider Order: No     Mobility  Bed Mobility Overal bed mobility: Needs Assistance Bed Mobility: Supine to Sit     Supine to sit: Mod assist, Max assist     General bed mobility comments: pt requesting to go to the bathroom so assisted from supine to EOB with Mod/Max Assist for upper body while pt was self able to swing legs around with increased time and effort to complete scooting to EOB.    Transfers Overall transfer level: Needs assistance Equipment used: None Transfers: Bed to chair/wheelchair/BSC Sit to Stand: +2 safety/equipment, Min assist, From elevated surface Stand pivot transfers: Contact guard assist, Min assist Step pivot transfers: Min assist, +2 safety/equipment, From elevated surface       General transfer comment: pt was able to stand pivot from elevated bed to 1/4 turn BSC NO AD but did use B UE's to support self and forward weight shift to rise (body habitus) able to complete full turn onto center of BSC.  Pt voided.  Then assist with sit to  stand and performed assistance with peri care as pt was unable to slight instability needing to support.  Pt was able to complete another 1/4 from [facility] back to bed again NO AD.  Self supporting using B UE's and forwad weight shift due to body habitus.  Tolerated well.  Less anxiety the second time.    Ambulation/Gait               General Gait Details: transfer only this session   Stairs             Wheelchair Mobility     Tilt Bed    Modified  Rankin (Stroke Patients Only)       Balance                                            Cognition Arousal: Alert Behavior During Therapy: Flat affect (emotional) Overall Cognitive Status: Difficult to assess                                 General Comments: Pt AxO x 3 emotional and very nervous/anxious.  Trembling.  Following all instructions and able to express needs.        Exercises      General Comments        Pertinent Vitals/Pain Pain Assessment Pain Assessment: No/denies pain Faces Pain Scale: Hurts even more Pain Location: upper ABD just below breats Pain Descriptors / Indicators: Discomfort, Grimacing Pain Intervention(s): Monitored during session    Home Living                          Prior Function            PT Goals (current goals can now be found in the care plan section) Progress towards PT goals: Progressing toward goals    Frequency    Min 1X/week      PT Plan      Co-evaluation              AM-PAC PT 6 Clicks Mobility   Outcome Measure  Help needed turning from your back to your side while in a flat bed without using bedrails?: A Lot Help needed moving from lying on your back to sitting on the side of a flat bed without using bedrails?: A Lot Help needed moving to and from a bed to a chair (including a wheelchair)?: A Lot Help needed standing up from a chair using your arms (e.g., wheelchair or bedside chair)?: A Lot Help needed to walk in hospital room?: A Lot Help needed climbing 3-5 steps with a railing? : Total 6 Click Score: 11    End of Session Equipment Utilized During Treatment: Gait belt Activity Tolerance: Patient limited by fatigue Patient left: in bed;with call bell/phone within reach (EOB) Nurse Communication: Mobility status PT Visit Diagnosis: Unsteadiness on feet (R26.81);Muscle weakness (generalized) (M62.81);Difficulty in walking, not elsewhere classified  (R26.2);Pain     Time: 8653-8595 PT Time Calculation (min) (ACUTE ONLY): 18 min  Charges:    $Therapeutic Activity: 8-22 mins PT General Charges $$ ACUTE PT VISIT: 1 Visit                     {Jaydy Fitzhenry  PTA Acute  Rehabilitation Services Office M-F  [phone]   

## 2023-06-07 NOTE — Evaluation (Signed)
 Occupational Therapy Evaluation Patient Details Name: Madison Palmer MRN: [id number] DOB: [birth year] Today's Date: 06/07/2023   History of Present Illness Madison Palmer is a 58 y.o. female presents to ED 2//25  with progressive Lower extremity weakness, SOB,  CT angiogram of the chest was negative for PE but did show a moderate-sized left pleural effusion with  also evidence of metastatic disease. Labs show mild hypokalemia macrocytic anemia and neutropenia.  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 Today she presents with decreased cognition, strength, balance, new LUE tremor, decreased activity tolerance.Pt minimally verbal throughout session, indicated pain in upper L abdomen. Performed transfers at min A +2 safety SPT to [facility] with additional environmental support either from furniture and DME,  Pt max A for peri care (Pt able to stand and OT performed), max A for LB dressing, Pt able to participate in grooming from seated position. Initiated education on energy conservation. Pt will benefit from handout and AE education. AT this time, OT is recommending POst-acute rehab of >3 hours daily to return patient to PLOF with assist from adult son who lives with her. OT will continue to follow acutely.      If plan is discharge home, recommend the following: A lot of help with walking and/or transfers;A lot of help with bathing/dressing/bathroom;Assistance with cooking/housework;Help with stairs or ramp for entrance    Functional Status Assessment     Equipment Recommendations  Tub/shower bench    Recommendations for Other Services Rehab consult;PT consult     Precautions / Restrictions Precautions Precautions:  Fall Precaution Comments: new O2 needs Restrictions Weight Bearing Restrictions Per Provider Order: No      Mobility Bed Mobility Overal bed mobility: Needs Assistance Bed Mobility: Supine to Sit     Supine to sit: Mod assist, +2 for safety/equipment (bed flat, Upper Rail Present)     General bed mobility comments: assist with legs and trunk to sitting this was painful for the patient today    Transfers Overall transfer level: Needs assistance Equipment used: Rolling walker (2 wheels) (Bari RW) Transfers: Sit to/from Stand, Bed to chair/wheelchair/BSC Sit to Stand: +2 safety/equipment, Min assist, From elevated surface     Step pivot transfers: Min assist, +2 safety/equipment, From elevated surface (both bed and BSC elevated)     General transfer comment: rocking momentum and A used to achieve full upright, PT benefits from external/environmental support for balance and physical support      Balance Overall balance assessment: Needs assistance Sitting-balance support: Feet supported Sitting balance-Leahy Scale: Fair Sitting balance - Comments: unchallenged EOB and BSC   Standing balance support: Bilateral upper extremity supported, Reliant on assistive device for balance   Standing balance comment: poor activity tolerance in standing                           ADL either performed or assessed with clinical judgement   ADL Overall ADL's : Needs assistance/impaired Eating/Feeding: Set up   Grooming: Wash/dry face;Oral care;Set up;Sitting Grooming Details (indicate cue type and reason): unable to maintain standing for grooming at this time Upper Body Bathing: Moderate assistance Upper Body Bathing Details (indicate cue type and reason): for back (would benefit from  AE education) Lower Body Bathing: Moderate assistance   Upper Body Dressing : Moderate assistance;Sitting Upper Body Dressing Details (indicate cue type and reason): new gown Lower Body  Dressing: Maximal assistance;Bed level Lower Body Dressing Details (indicate cue type and reason): socks Toilet Transfer: Minimal assistance;+2 for safety/equipment;Stand-pivot;BSC/3in1 Toilet Transfer Details (indicate cue type and reason): Bari [facility] Toileting- Clothing Manipulation and Hygiene: Maximal assistance;Sit to/from stand Toileting - Clothing Manipulation Details (indicate cue type and reason): PT able to stand, OT performed peri care in standing     Functional mobility during ADLs: Minimal assistance;+2 for safety/equipment;Rolling walker (2 wheels) (+2 for safety, fatigues VERY quickly) General ADL Comments: would benefit from AE eduation as well as energy conservation education (started verbal education today)     Vision Baseline Vision/History: 1 Wears glasses Patient Visual Report: No change from baseline       Perception         Praxis         Pertinent Vitals/Pain Pain Assessment Pain Assessment: Faces Faces Pain Scale: Hurts even more Pain Location: legs/hips/back with rolling Pain Descriptors / Indicators: Discomfort, Grimacing Pain Intervention(s): Limited activity within patient's tolerance, Monitored during session, Repositioned     Extremity/Trunk Assessment Upper Extremity Assessment Upper Extremity Assessment: Left hand dominant;LUE deficits/detail LUE Deficits / Details: new tremors for LUE, also tapping on face (specifically jaw and temple - almost seemed  unaware of doing it)   Lower Extremity Assessment Lower Extremity Assessment: Defer to PT evaluation;Generalized weakness   Cervical / Trunk Assessment Cervical / Trunk Assessment: Other exceptions Cervical / Trunk Exceptions: body habitus   Communication Communication Communication: No apparent difficulties Cueing Techniques: Verbal cues   Cognition Arousal: Alert Behavior During Therapy: Flat affect Overall Cognitive Status: Difficult to assess                                  General Comments: no family present, patient reports that she was in [place]  when she  was unable to stand, unsure of reliability.     General Comments  no family present today. 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      Prior Functioning/Environment Prior Level of Function : Driving;Independent/Modified Independent;Working/employed             Mobility Comments: uses a cane          OT Problem List:        OT Treatment/Interventions:      OT Goals(Current goals can be found in the care plan section) Acute Rehab OT Goals Patient Stated Goal: none stated OT Goal Formulation: With patient Time For Goal Achievement: 06/21/23 Potential to Achieve Goals: Good ADL Goals Pt Will Perform Eating: with modified independence;sitting;with adaptive utensils Pt Will Perform Grooming: with modified independence;sitting Pt Will Perform Upper Body Dressing: with modified independence;sitting Pt  Will Perform Lower Body Dressing: with supervision;sit to/from stand Pt Will Transfer to Toilet: with modified independence;ambulating Pt Will Perform Toileting - Clothing Manipulation and hygiene: with modified independence;sit to/from stand Additional ADL Goal #1: Pt will verbalize at least 3 strategies for energy conservation during ADL with no cues  OT Frequency: Min 1X/week     Co-evaluation PT/OT/SLP Co-Evaluation/Treatment: Yes Reason for Co-Treatment: For patient/therapist safety;To address functional/ADL transfers PT goals addressed during session: Mobility/safety with mobility;Balance;Proper use of DME OT goals addressed during session: ADL's and self-care;Strengthening/ROM      AM-PAC OT 6 Clicks Daily Activity     Outcome Measure Help from another person eating meals?: A Little Help from another person taking care of personal grooming?: A Little Help from another person toileting, which includes using toliet, bedpan, or urinal?: A Lot Help from another person bathing (including washing, rinsing, drying)?: A Lot Help from another person to put on and taking off regular upper body clothing?: A Little Help from another person to put on and taking off regular lower body clothing?: A Lot 6 Click Score: 15   End of Session Equipment Utilized During Treatment: Gait belt;Rolling walker (2 wheels);Oxygen (2L) Nurse Communication: Mobility status (written on white board)  Activity Tolerance: Patient limited by fatigue Patient left: in chair;with call bell/phone within reach;with chair alarm set  OT Visit Diagnosis: Unsteadiness on feet (R26.81);Muscle weakness (generalized) (M62.81)                Time: 8974-8948 OT Time Calculation (min): 26 min Charges:  OT General Charges $OT Visit: 1 Visit OT Evaluation $OT Eval Moderate Complexity: 1 Mod  Leita DEL OTR/L Acute Rehabilitation Services Office: [phone]  Leita PARAS [facility] 06/07/2023, 11:55 AM

## 2023-06-08 ENCOUNTER — Inpatient Hospital Stay (HOSPITAL_COMMUNITY): Payer: BC Managed Care – PPO

## 2023-06-08 DIAGNOSIS — R531 Weakness: Secondary | ICD-10-CM | POA: Diagnosis not present

## 2023-06-08 LAB — CBC WITH DIFFERENTIAL/PLATELET
Abs Immature Granulocytes: 0.11 10*3/uL — ABNORMAL HIGH (ref 0.00–0.07)
Basophils Absolute: 0 10*3/uL (ref 0.0–0.1)
Basophils Relative: 1 %
Eosinophils Absolute: 0 10*3/uL (ref 0.0–0.5)
Eosinophils Relative: 1 %
HCT: 25.7 % — ABNORMAL LOW (ref 36.0–46.0)
Hemoglobin: 8 g/dL — ABNORMAL LOW (ref 12.0–15.0)
Immature Granulocytes: 4 %
Lymphocytes Relative: 17 %
Lymphs Abs: 0.5 10*3/uL — ABNORMAL LOW (ref 0.7–4.0)
MCH: 34.3 pg — ABNORMAL HIGH (ref 26.0–34.0)
MCHC: 31.1 g/dL (ref 30.0–36.0)
MCV: 110.3 fL — ABNORMAL HIGH (ref 80.0–100.0)
Monocytes Absolute: 0.2 10*3/uL (ref 0.1–1.0)
Monocytes Relative: 7 %
Neutro Abs: 1.9 10*3/uL (ref 1.7–7.7)
Neutrophils Relative %: 70 %
Platelets: 29 10*3/uL — CL (ref 150–400)
RBC: 2.33 MIL/uL — ABNORMAL LOW (ref 3.87–5.11)
RDW: 16.9 % — ABNORMAL HIGH (ref 11.5–15.5)
Smear Review: DECREASED
WBC: 2.7 10*3/uL — ABNORMAL LOW (ref 4.0–10.5)
nRBC: 0 % (ref 0.0–0.2)

## 2023-06-08 LAB — BODY FLUID CELL COUNT WITH DIFFERENTIAL
Eos, Fluid: 0 %
Lymphs, Fluid: 97 %
Monocyte-Macrophage-Serous Fluid: 1 % — ABNORMAL LOW (ref 50–90)
Neutrophil Count, Fluid: 2 % (ref 0–25)
Total Nucleated Cell Count, Fluid: 33 uL (ref 0–1000)

## 2023-06-08 LAB — BASIC METABOLIC PANEL
Anion gap: 9 (ref 5–15)
BUN: 33 mg/dL — ABNORMAL HIGH (ref 6–20)
CO2: 33 mmol/L — ABNORMAL HIGH (ref 22–32)
Calcium: 10.2 mg/dL (ref 8.9–10.3)
Chloride: 98 mmol/L (ref 98–111)
Creatinine, Ser: 1.58 mg/dL — ABNORMAL HIGH (ref 0.44–1.00)
GFR, Estimated: 38 mL/min — ABNORMAL LOW (ref >60)
Glucose, Bld: 119 mg/dL — ABNORMAL HIGH (ref 70–99)
Potassium: 3.9 mmol/L (ref 3.5–5.1)
Sodium: 140 mmol/L (ref 135–145)

## 2023-06-08 LAB — MAGNESIUM: Magnesium: 2.1 mg/dL (ref 1.7–2.4)

## 2023-06-08 MED ORDER — SODIUM CHLORIDE 0.9 % IV SOLN
2.0000 g | Freq: Two times a day (BID) | INTRAVENOUS | Status: DC
  Administered 2023-06-08 – 2023-06-12 (×7): 2 g via INTRAVENOUS
  Filled 2023-06-08 (×7): qty 12.5

## 2023-06-08 MED ORDER — HYDROXYZINE HCL 25 MG PO TABS
25.0000 mg | ORAL_TABLET | Freq: Once | ORAL | Status: AC
  Administered 2023-06-08: 25 mg via ORAL
  Filled 2023-06-08: qty 1

## 2023-06-08 MED ORDER — LIDOCAINE HCL 1 % IJ SOLN
INTRAMUSCULAR | Status: AC
  Filled 2023-06-08: qty 20

## 2023-06-08 MED ORDER — DOXYCYCLINE HYCLATE 100 MG PO TABS
100.0000 mg | ORAL_TABLET | Freq: Two times a day (BID) | ORAL | Status: DC
Start: 2023-06-08 — End: 2023-06-13
  Administered 2023-06-08 – 2023-06-13 (×11): 100 mg via ORAL
  Filled 2023-06-08 (×11): qty 1

## 2023-06-08 MED ORDER — HYDROXYZINE HCL 25 MG PO TABS
25.0000 mg | ORAL_TABLET | Freq: Three times a day (TID) | ORAL | Status: DC | PRN
  Administered 2023-06-08 – 2023-06-14 (×14): 25 mg via ORAL
  Filled 2023-06-08 (×14): qty 1

## 2023-06-08 NOTE — Procedures (Signed)
 PROCEDURE SUMMARY:  Successful US  guided left thoracentesis. Yielded 400 mL of yellow fluid. Pt tolerated procedure well. No immediate complications.  Specimen was sent for labs. CXR ordered.  EBL < 5 mL  Solmon Selmer Ku PA-C 06/08/2023 2:52 PM

## 2023-06-08 NOTE — Progress Notes (Signed)
 PT Cancellation Note  Patient Details Name: [name] MRN: [id number] DOB: 1965-10-23   Cancelled Treatment:     Pt out of room for a procedure.SABRASABRASABRAThoracentesis Will attempt to see another day as schedule permits.  [name]  PTA Acute  Rehabilitation Services Office M-F          [phone]

## 2023-06-08 NOTE — Plan of Care (Signed)

## 2023-06-08 NOTE — Progress Notes (Signed)
 Pharmacy Antibiotic Note  Madison Palmer is a 58 y.o. female admitted on 06/05/2023 with pneumonia.  Pharmacy has been consulted for Cefepime  dosing.   ID: r/o PNA; panniculitis WBC 1.4>2.7 s/p Granix  Scr up to 1.58 today, Afebrile  Antimicrobials this admission: 2/5 vancomycin  >> 2/7 2/5 cefepime  >>  Doxy 2/7>>  Dose adjustments this admission: Microbiology results: 2/5 BCx: NGTD 2/5 MRSA PCR: NEG  Plan: IV Vanco>>Doxy Adjust Cefepime  to 2g IV q12 hrs today.    Height: 5' 2 (157.5 cm) Weight: 128.6 kg (283 lb 8.2 oz) IBW/kg (Calculated) : 50.1  Temp (24hrs), Avg:98.3 F (36.8 C), Min:98.2 F (36.8 C), Max:98.5 F (36.9 C)  Recent Labs  Lab 06/05/23 1638 06/06/23 0557 06/07/23 0953 06/08/23 0844  WBC 1.0* 0.7* 1.4* 2.7*  CREATININE 0.98 1.02* 1.33* 1.58*    Estimated Creatinine Clearance: 50.5 mL/min (A) (by C-G formula based on SCr of 1.58 mg/dL (H)).    Allergies  Allergen Reactions   Other Other (See Comments)    Hydralazine  Hcl Other reaction(s): Other (See Comments) Instructed not to take by Cardiology.   Fentanyl  Hives    Other reaction(s): hives had fentanyl  recently with benadryl , did OK   Levofloxacin Hives   Midazolam  Hives   Pollen Extract     seasonal   Atorvastatin  Other (See Comments)    Muscle pain in legs  Abdominal pain   Hydralazine  Hcl Other (See Comments)    Hypercalcemia    Rosuvastatin Other (See Comments)    Abdominal pain Other reaction(s): diarrhea    Madison Palmer, PharmD, BCPS Clinical Staff Pharmacist [facility] 06/08/2023 11:55 AM

## 2023-06-08 NOTE — Progress Notes (Addendum)
 PROGRESS NOTE    ALTHERIA SHADOAN  FMW:[id number] DOB: 12/04/1965 DOA: 06/05/2023 PCP: Katheen Roselie [place], NP   Brief Narrative: 58 year old with past medical history significant for stage IV lung cancer on chemotherapy, and Keytruda  last dose was 05/29/2023, history of diastolic heart failure, hypertension, hyperlipidemia, sleep apnea, depression, hypothyroidism, who presented with progressive lower extremity weakness over the last 2 weeks.  Patient states that her weakness was initially on the right side and in the last few days presents on both lower extremities.  Patient recently had follow-up with oncologist Dr. Gatha on 1/28 when MRI of the brain was planted.  MRI of the brain 1/21st was negative for acute finding.   Assessment & Plan:   Principal Problem:   Lower extremity weakness Active Problems:   Hypothyroidism   Obstructive sleep apnea   Essential hypertension   Chronic diastolic HF (heart failure) (HCC)   Class 3 severe obesity with serious comorbidity and body mass index (BMI) of 50.0 to 59.9 in adult [facility])   Adenocarcinoma of left lung, stage 4 (HCC)   Weakness   Neutropenia (HCC)   Neuropathy   Macrocytic anemia   Thrombocytopenia (HCC)   1-Bilateral lower extremity weakness:  -Admitted discussed case with on-call neurology, who thought her symptoms were more related to deconditioning. -MRI brain: no acute finding.  -MR Lumbar:  7 mm lesion within the S1 segment, with additional 8 mm lesion within the left sacral ala. Given history of metastatic non-small cell lung cancer, findings are concerning for small osseous metastases. No extra osseous or epidural tumor. Right foraminal to extraforaminal disc protrusion at L2-3, potentially affecting the exiting right L2 nerve root. Moderate to advanced lower lumbar facet hypertrophy, which could contribute to lower back pain.  -weakness improving.  -Discussed MRI spine with Dr Tressia, no surgical intervention needed, Out  patient follow up.  Needs PT  2-Neutropenia, History of stage IV Lung cancer;  -Neutropenia in the setting of recent chemotherapy Cussed with Dr. Gatha plan to continue with Granix  on the ANC more than 1500 Improving. WBC increase to 2.7  3-Left pleural effusion concern with malignancy versus loculated -Discussed with Dr. Gatha we can proceed with thoracentesis when white blood cell counts improve -Continue  IV antibiotics to cover for infectious process -IV cefepime  and vancomycin   -Worsening SOB this afternoon, oxygen requirement increase to 4 L.  -chest x ray small effusion. CCM consulted recommend thoracentesis by IR>  IR consulted for thoracentesis. FU IR recommendation in regards if she would need platelet transfusion for thoracentesis.   4-panniculitis, pubic area with redness and sluggish skin.  Concern with superimposed bacterial cellulitis -Continue with  IV antibiotics -Nystatin  powder  Hypertension: Continue with labetalol ,  amlodipine  Hold lisinopril  due to increase cr  Increase Cr; monitor.   Abnormal rhythm on monitor per nurse. Mg replaced.   Hyperlipidemia: Continue with the statins and fenofibrate   Lipid apnea, continue with CPAP  Normocytic  anemia: Follow anemia panel  thrombocytopenia in the setting of chemotherapy  Diastolic heart failure chronic compensated -resume lasix  tomorrow  Depression: Continue Latuda  and Cogentin  Anxiety, agitation last night; had haldol . Atarax  PRN  Neuropathy: Continue with gabapentin  Mild hypokalemia: Replace  Severe Obesity   Estimated body mass index is 51.85 kg/m as calculated from the following:   Height as of this encounter: 5' 2 (1.575 m).   Weight as of this encounter: 128.6 kg.   DVT prophylaxis: SCDs Code Status: Full code Family Communication: Care discussed with patient Disposition Plan:  Status is: Observation The patient will require care spanning > 2 midnights and should be moved to  inpatient because: Management of neutropenia, weakness pleural effusion    Consultants:  Oncology   Procedures:  none  Antimicrobials:    Subjective: She gets anxious on and off, from been unfamiliar environment.  Report atarax  helps. Report SOB same, not worse today   Objective: Vitals:   06/07/23 1236 06/07/23 2157 06/08/23 0529 06/08/23 0847  BP: (!) 112/58 108/67 122/75 122/75  Pulse: 86 80 76   Resp: 20 18 18    Temp: 98.3 F (36.8 C) 98.2 F (36.8 C) 98.5 F (36.9 C)   TempSrc:      SpO2: 98% 100% 100%   Weight:      Height:        Intake/Output Summary (Last 24 hours) at 06/08/2023 1119 Last data filed at 06/08/2023 0518 Gross per 24 hour  Intake 238 ml  Output 1500 ml  Net -1262 ml   Filed Weights   06/05/23 1621 06/06/23 1605  Weight: 127.6 kg 128.6 kg    Examination:  General exam: Morbid obese Respiratory system: Normal Resp effort, BL air movement Cardiovascular system: S 1, S 2 RRR Gastrointestinal system: alert, follows command Extremities: She is able to move bilateral lower lower extremities   Data Reviewed: I have personally reviewed following labs and imaging studies  CBC: Recent Labs  Lab 06/05/23 1638 06/06/23 0557 06/07/23 0953 06/08/23 0844  WBC 1.0* 0.7* 1.4* 2.7*  NEUTROABS 0.4* 0.2* 1.0* 1.9  HGB 10.5* 9.5* 9.2* 8.0*  HCT 32.5* 29.8* 29.1* 25.7*  MCV 106.6* 108.4* 109.4* 110.3*  PLT 109* 83* 42* 29*   Basic Metabolic Panel: Recent Labs  Lab 06/05/23 1638 06/06/23 0557 06/07/23 0953 06/07/23 1418 06/08/23 0844  NA 141 140 141  --  140  K 3.2* 3.7 3.8  --  3.9  CL 96* 95* 99  --  98  CO2 33* 34* 34*  --  33*  GLUCOSE 116* 122* 196*  --  119*  BUN 17 21* 28*  --  33*  CREATININE 0.98 1.02* 1.33*  --  1.58*  CALCIUM  10.1 9.9 10.2  --  10.2  MG  --  2.0  --  1.8 2.1   GFR: Estimated Creatinine Clearance: 50.5 mL/min (A) (by C-G formula based on SCr of 1.58 mg/dL (H)). Liver Function Tests: Recent Labs  Lab  06/06/23 0557 06/07/23 0953  AST 39 26  ALT 33 26  ALKPHOS 60 58  BILITOT 2.1* 1.3*  PROT 6.5 6.1*  ALBUMIN 2.7* 2.4*   No results for input(s): LIPASE, AMYLASE in the last 168 hours. No results for input(s): AMMONIA in the last 168 hours. Coagulation Profile: No results for input(s): INR, PROTIME in the last 168 hours. Cardiac Enzymes: Recent Labs  Lab 06/06/23 0557  CKTOTAL 66   BNP (last 3 results) No results for input(s): PROBNP in the last 8760 hours. HbA1C: No results for input(s): HGBA1C in the last 72 hours. CBG: No results for input(s): GLUCAP in the last 168 hours. Lipid Profile: No results for input(s): CHOL, HDL, LDLCALC, TRIG, CHOLHDL, LDLDIRECT in the last 72 hours. Thyroid  Function Tests: Recent Labs    06/06/23 0557  TSH 0.769   Anemia Panel: Recent Labs    06/06/23 0557  VITAMINB12 1,358*  FOLATE 18.0  FERRITIN 1,468*  TIBC 259  IRON 210*  RETICCTPCT <0.4*   Sepsis Labs: No results for input(s): PROCALCITON, LATICACIDVEN in the last  168 hours.  Recent Results (from the past 240 hours)  Culture, blood (Routine X 2) w Reflex to ID Panel     Status: None (Preliminary result)   Collection Time: 06/06/23  8:55 AM   Specimen: BLOOD LEFT WRIST  Result Value Ref Range Status   Specimen Description   Final    BLOOD LEFT WRIST Performed at [facility], [address], [place], [place] [address]    Special Requests   Final    BOTTLES DRAWN AEROBIC AND ANAEROBIC Blood Culture results may not be optimal due to an inadequate volume of blood received in culture bottles Performed at [facility], [address] [place], [place] [address]    Culture   Final    NO GROWTH 2 DAYS Performed at [facility], [address], [place], [place] [address]    Report Status PENDING  Incomplete  Culture, blood (Routine X 2) w Reflex to ID Panel     Status: None (Preliminary result)   Collection  Time: 06/06/23  8:58 AM   Specimen: BLOOD RIGHT WRIST  Result Value Ref Range Status   Specimen Description   Final    BLOOD RIGHT WRIST Performed at [facility], [address], [place], [place] [address]    Special Requests   Final    BOTTLES DRAWN AEROBIC AND ANAEROBIC Blood Culture results may not be optimal due to an inadequate volume of blood received in culture bottles Performed at [facility], [address] [place], [place] [address]    Culture   Final    NO GROWTH 2 DAYS Performed at [facility], [address], [place], [place] [address]    Report Status PENDING  Incomplete  MRSA Next Gen by PCR, Nasal     Status: None   Collection Time: 06/06/23  5:39 PM   Specimen: Nasal Mucosa; Nasal Swab  Result Value Ref Range Status   MRSA by PCR Next Gen NOT DETECTED NOT DETECTED Final    Comment: (NOTE) The GeneXpert MRSA Assay (FDA approved for NASAL specimens only), is one component of a comprehensive MRSA colonization surveillance program. It is not intended to diagnose MRSA infection nor to guide or monitor treatment for MRSA infections. Test performance is not FDA approved in patients less than 56 years old. Performed at [facility], [address], [place], [place] [address]          Radiology Studies: DG CHEST PORT 1 VIEW Result Date: 06/07/2023 CLINICAL DATA:  Shortness of breath and weakness. EXAM: PORTABLE CHEST 1 VIEW COMPARISON:  Chest CT dated 06/06/2023. FINDINGS: Small left pleural effusion and left lung base atelectasis or infiltrate. The right lung is clear. No pneumothorax. Stable cardiac silhouette. No acute osseous pathology. IMPRESSION: Small left pleural effusion and left lung base atelectasis or infiltrate. A Electronically Signed   By: Vanetta Chou M.D.   On: 06/07/2023 15:06   MR BRAIN WO CONTRAST Result Date: 06/07/2023 CLINICAL DATA:  Neuro deficit, acute, stroke suspected. EXAM: MRI HEAD  WITHOUT CONTRAST TECHNIQUE: Multiplanar, multiecho pulse sequences of the brain and surrounding structures were obtained without intravenous contrast. COMPARISON:  MRI of the brain May 22, 2023. FINDINGS: Incomplete study due to patient inability to lie flat in the scanner for the duration of the study. Images obtained are partially degraded by motion. Brain: No acute infarction, hemorrhage, hydrocephalus, extra-axial collection or mass lesion. Vascular: Normal flow voids. Skull  and upper cervical spine: Decreased T1 signal of calvarium, likely related to history of anemia. Sinuses/Orbits: Mild mucosal thickening in on the right maxillary sinus. The orbits are grossly unremarkable. Other: None. IMPRESSION: 1. Incomplete study. Images obtained are partially degraded by motion. 2. No acute intracranial abnormality identified. Electronically Signed   By: Katyucia  de Macedo Rodrigues M.D.   On: 06/07/2023 10:08        Scheduled Meds:  amLODipine   10 mg Oral Daily   atorvastatin   20 mg Oral Q M,W,F   benztropine   0.5 mg Oral QHS   cholecalciferol   5,000 Units Oral Daily   doxycycline   100 mg Oral Q12H   fenofibrate   160 mg Oral Daily   folic acid   1 mg Oral Daily   gabapentin   200 mg Oral QHS   labetalol   450 mg Oral BID   levothyroxine   88 mcg Oral Q0600   lurasidone   40 mg Oral Q breakfast   nystatin    Topical TID   pantoprazole   40 mg Oral Daily   Continuous Infusions:  ceFEPime  (MAXIPIME ) IV 2 g (06/08/23 0527)     LOS: 2 days    Time spent: 35 minutes    Nerea Bordenave A Dayna Geurts, MD [facility]   If 7PM-7AM, please contact night-coverage [email or web address]  06/08/2023, 11:19 AM

## 2023-06-08 NOTE — Progress Notes (Signed)
 Inpatient Rehabilitation Admissions Coordinator   Noted patient at thoracentesis. I will contact her today to discuss her rehab venue options.  Jeannetta Millman, RN, MSN Rehab Admissions Coordinator [phone] 06/08/2023 2:34 PM

## 2023-06-08 NOTE — Progress Notes (Signed)
   06/08/23 2250  BiPAP/CPAP/SIPAP  BiPAP/CPAP/SIPAP Pt Type Adult (machine plugged into red outlet)  BiPAP/CPAP/SIPAP Resmed  Mask Type Nasal mask  Dentures removed? Not applicable  Mask Size Medium  EPAP 9 cmH2O (per patient stated home settings)  Flow Rate 2 lpm (bled into circuit)  Patient Home Equipment No  Auto Titrate No

## 2023-06-08 NOTE — Plan of Care (Signed)

## 2023-06-08 NOTE — Progress Notes (Addendum)
  Inpatient Rehabilitation Admissions Coordinator   I spoke with patient by phone for rehab assessment. We discussed goals and expectations of a possible CIR admit. She is separated and lives with her 58 yo son. He is at home most of the time. Her initial thought was that she felt she could go home with her son  to help. I told her we need to see how she does over the weekend with therapy tolerance, and we would call her again Monday to see if she would like to pursue a CIR admit with her [employer] or if she preferred direct discharge home when medically ready.  Please call me with any questions.   Heron Leavell, RN, MSN Rehab Admissions Coordinator [phone]

## 2023-06-09 DIAGNOSIS — R531 Weakness: Secondary | ICD-10-CM | POA: Diagnosis not present

## 2023-06-09 LAB — CBC
HCT: 26.4 % — ABNORMAL LOW (ref 36.0–46.0)
Hemoglobin: 8.1 g/dL — ABNORMAL LOW (ref 12.0–15.0)
MCH: 33.8 pg (ref 26.0–34.0)
MCHC: 30.7 g/dL (ref 30.0–36.0)
MCV: 110 fL — ABNORMAL HIGH (ref 80.0–100.0)
Platelets: 17 10*3/uL — CL (ref 150–400)
RBC: 2.4 MIL/uL — ABNORMAL LOW (ref 3.87–5.11)
RDW: 16.9 % — ABNORMAL HIGH (ref 11.5–15.5)
WBC: 3.9 10*3/uL — ABNORMAL LOW (ref 4.0–10.5)
nRBC: 0 % (ref 0.0–0.2)

## 2023-06-09 LAB — BASIC METABOLIC PANEL
Anion gap: 8 (ref 5–15)
BUN: 37 mg/dL — ABNORMAL HIGH (ref 6–20)
CO2: 34 mmol/L — ABNORMAL HIGH (ref 22–32)
Calcium: 10.4 mg/dL — ABNORMAL HIGH (ref 8.9–10.3)
Chloride: 101 mmol/L (ref 98–111)
Creatinine, Ser: 1.53 mg/dL — ABNORMAL HIGH (ref 0.44–1.00)
GFR, Estimated: 39 mL/min — ABNORMAL LOW (ref >60)
Glucose, Bld: 118 mg/dL — ABNORMAL HIGH (ref 70–99)
Potassium: 4.2 mmol/L (ref 3.5–5.1)
Sodium: 143 mmol/L (ref 135–145)

## 2023-06-09 MED ORDER — LORAZEPAM 0.5 MG PO TABS
0.5000 mg | ORAL_TABLET | Freq: Once | ORAL | Status: AC
  Administered 2023-06-09: 0.5 mg via ORAL
  Filled 2023-06-09: qty 1

## 2023-06-09 MED ORDER — POLYETHYLENE GLYCOL 3350 17 G PO PACK
17.0000 g | PACK | Freq: Two times a day (BID) | ORAL | Status: DC
Start: 2023-06-09 — End: 2023-06-15
  Administered 2023-06-09 – 2023-06-15 (×8): 17 g via ORAL
  Filled 2023-06-09 (×11): qty 1

## 2023-06-09 NOTE — Progress Notes (Signed)
 PROGRESS NOTE    Madison Palmer  FMW:[id number] DOB: 01-23-1966 DOA: 06/05/2023 PCP: Katheen Roselie Rockford, NP   Brief Narrative: 58 year old with past medical history significant for stage IV lung cancer on chemotherapy, and Keytruda  last dose was 05/29/2023, history of diastolic heart failure, hypertension, hyperlipidemia, sleep apnea, depression, hypothyroidism, who presented with progressive lower extremity weakness over the last 2 weeks.  Patient states that her weakness was initially on the right side and in the last few days presents on both lower extremities.  Patient recently had follow-up with oncologist Dr. Gatha on 1/28 when MRI of the brain was planted.  MRI of the brain 1/21st was negative for acute finding.   Assessment & Plan:   Principal Problem:   Lower extremity weakness Active Problems:   Hypothyroidism   Obstructive sleep apnea   Essential hypertension   Chronic diastolic HF (heart failure) (HCC)   Class 3 severe obesity with serious comorbidity and body mass index (BMI) of 50.0 to 59.9 in adult [facility])   Adenocarcinoma of left lung, stage 4 (HCC)   Weakness   Neutropenia (HCC)   Neuropathy   Macrocytic anemia   Thrombocytopenia (HCC)   1-Bilateral lower extremity weakness:  -Admitted discussed case with on-call neurology, who thought her symptoms were more related to deconditioning. -MRI brain: no acute finding.  -MR Lumbar:  7 mm lesion within the S1 segment, with additional 8 mm lesion within the left sacral ala. Given history of metastatic non-small cell lung cancer, findings are concerning for small osseous metastases. No extra osseous or epidural tumor. Right foraminal to extraforaminal disc protrusion at L2-3, potentially affecting the exiting right L2 nerve root. Moderate to advanced lower lumbar facet hypertrophy, which could contribute to lower back pain.  -Weakness improving.  -Discussed MRI spine with Dr Tressia, no surgical intervention needed, Out  patient follow up.  Needs PT She agrees to go to CIR>   2-Neutropenia, History of stage IV Lung cancer;  -Neutropenia in the setting of recent chemotherapy Pancytopenia, in setting chemotherapy.  Cussed with Dr. Gatha plan to continue with Granix  on the ANC more than 1500 Improving. WBC increase to 3.9 Thrombocytopenia worse, down to 17 today. Will inform oncology. No evidence of active bleeding.   3-Left pleural effusion concern with malignancy versus loculated -Discussed with Dr. Gatha we can proceed with thoracentesis when white blood cell counts improved -Continue  IV antibiotics to cover for infectious process -IV cefepime  . Vanc change to Doxy due to renal insufficiency.  -2/6: Worsening SOB in the  afternoon, oxygen requirement increase to 4 L.  -Chest x ray small effusion. CCM consulted recommend thoracentesis by IR>  -IR consulted for thoracentesis. -Underwent Thoracentesis 400 cc of fluid removed. Follow culture.   4-Panniculitis, pubic area with redness and sluggish skin.  Concern with superimposed bacterial cellulitis -Continue with  IV antibiotics -Nystatin  powder  Hypertension: Continue with labetalol ,  amlodipine  Hold lisinopril  due to increase cr  Increase Cr; monitor.  Encourage to drink fluids.  Holding Lisinopril .  Previous Cr 2 weeks ago 1.4  Abnormal rhythm on monitor per nurse. Mg replaced.   Hyperlipidemia: Continue with the statins and fenofibrate   Lipid apnea, continue with CPAP  Normocytic  anemia: Follow anemia panel  thrombocytopenia in the setting of chemotherapy  Diastolic heart failure chronic compensated -resume lasix  tomorrow  Depression: Continue Latuda  and Cogentin  Anxiety, agitation last night; had haldol . Atarax  PRN  Neuropathy: Continue with gabapentin  Mild hypokalemia: Replace  Severe Obesity ; needs life style  modifications.   Estimated body mass index is 51.85 kg/m as calculated from the following:   Height as of  this encounter: 5' 2 (1.575 m).   Weight as of this encounter: 128.6 kg.   DVT prophylaxis: SCDs Code Status: Full code Family Communication: Care discussed with patient Disposition Plan:  Status is: Observation The patient will require care spanning > 2 midnights and should be moved to inpatient because: Management of neutropenia, weakness pleural effusion    Consultants:  Oncology   Procedures:  none  Antimicrobials:    Subjective: She is less anxious today, she is breathing better after thoracentesis.  She thought that the procedure was painful.  The eyes melena, blood in the stool, hematemesis.   Objective: Vitals:   06/08/23 1919 06/09/23 0459 06/09/23 0935 06/09/23 1100  BP: (!) 128/91 121/72 111/65   Pulse: 93 81 87   Resp: 15 16 16    Temp: 97.9 F (36.6 C) 98.3 F (36.8 C)  98.2 F (36.8 C)  TempSrc:      SpO2: 99% 100% 100%   Weight:      Height:        Intake/Output Summary (Last 24 hours) at 06/09/2023 1353 Last data filed at 06/09/2023 1328 Gross per 24 hour  Intake 1440 ml  Output 2100 ml  Net -660 ml   Filed Weights   06/05/23 1621 06/06/23 1605  Weight: 127.6 kg 128.6 kg    Examination:  General exam: Morbidly obese Respiratory system: Air movement Cardiovascular system: S 1, S 2 RRR Gastrointestinal system: Alert, follows command Extremities: She is able to move bilateral lower lower extremities   Data Reviewed: I have personally reviewed following labs and imaging studies  CBC: Recent Labs  Lab 06/05/23 1638 06/06/23 0557 06/07/23 0953 06/08/23 0844 06/09/23 0711  WBC 1.0* 0.7* 1.4* 2.7* 3.9*  NEUTROABS 0.4* 0.2* 1.0* 1.9  --   HGB 10.5* 9.5* 9.2* 8.0* 8.1*  HCT 32.5* 29.8* 29.1* 25.7* 26.4*  MCV 106.6* 108.4* 109.4* 110.3* 110.0*  PLT 109* 83* 42* 29* 17*   Basic Metabolic Panel: Recent Labs  Lab 06/05/23 1638 06/06/23 0557 06/07/23 0953 06/07/23 1418 06/08/23 0844 06/09/23 0711  NA 141 140 141  --  140 143  K  3.2* 3.7 3.8  --  3.9 4.2  CL 96* 95* 99  --  98 101  CO2 33* 34* 34*  --  33* 34*  GLUCOSE 116* 122* 196*  --  119* 118*  BUN 17 21* 28*  --  33* 37*  CREATININE 0.98 1.02* 1.33*  --  1.58* 1.53*  CALCIUM  10.1 9.9 10.2  --  10.2 10.4*  MG  --  2.0  --  1.8 2.1  --    GFR: Estimated Creatinine Clearance: 52.2 mL/min (A) (by C-G formula based on SCr of 1.53 mg/dL (H)). Liver Function Tests: Recent Labs  Lab 06/06/23 0557 06/07/23 0953  AST 39 26  ALT 33 26  ALKPHOS 60 58  BILITOT 2.1* 1.3*  PROT 6.5 6.1*  ALBUMIN 2.7* 2.4*   No results for input(s): LIPASE, AMYLASE in the last 168 hours. No results for input(s): AMMONIA in the last 168 hours. Coagulation Profile: No results for input(s): INR, PROTIME in the last 168 hours. Cardiac Enzymes: Recent Labs  Lab 06/06/23 0557  CKTOTAL 66   BNP (last 3 results) No results for input(s): PROBNP in the last 8760 hours. HbA1C: No results for input(s): HGBA1C in the last 72 hours. CBG: No results  for input(s): GLUCAP in the last 168 hours. Lipid Profile: No results for input(s): CHOL, HDL, LDLCALC, TRIG, CHOLHDL, LDLDIRECT in the last 72 hours. Thyroid  Function Tests: No results for input(s): TSH, T4TOTAL, FREET4, T3FREE, THYROIDAB in the last 72 hours.  Anemia Panel: No results for input(s): VITAMINB12, FOLATE, FERRITIN, TIBC, IRON, RETICCTPCT in the last 72 hours.  Sepsis Labs: No results for input(s): PROCALCITON, LATICACIDVEN in the last 168 hours.  Recent Results (from the past 240 hours)  Culture, blood (Routine X 2) w Reflex to ID Panel     Status: None (Preliminary result)   Collection Time: 06/06/23  8:55 AM   Specimen: BLOOD LEFT WRIST  Result Value Ref Range Status   Specimen Description   Final    BLOOD LEFT WRIST Performed at [facility], [address], [place], [place] [address]    Special Requests   Final    BOTTLES DRAWN AEROBIC AND ANAEROBIC  Blood Culture results may not be optimal due to an inadequate volume of blood received in culture bottles Performed at [facility], [address], [place], [place] [address]    Culture   Final    NO GROWTH 3 DAYS Performed at [facility], [address], [place], [place] [address]    Report Status PENDING  Incomplete  Culture, blood (Routine X 2) w Reflex to ID Panel     Status: None (Preliminary result)   Collection Time: 06/06/23  8:58 AM   Specimen: BLOOD RIGHT WRIST  Result Value Ref Range Status   Specimen Description   Final    BLOOD RIGHT WRIST Performed at [facility], [address] [place], [place] [address]    Special Requests   Final    BOTTLES DRAWN AEROBIC AND ANAEROBIC Blood Culture results may not be optimal due to an inadequate volume of blood received in culture bottles Performed at [facility], [address] [place], [place] [address]    Culture   Final    NO GROWTH 3 DAYS Performed at [facility], [address], [place], [place] [address]    Report Status PENDING  Incomplete  MRSA Next Gen by PCR, Nasal     Status: None   Collection Time: 06/06/23  5:39 PM   Specimen: Nasal Mucosa; Nasal Swab  Result Value Ref Range Status   MRSA by PCR Next Gen NOT DETECTED NOT DETECTED Final    Comment: (NOTE) The GeneXpert MRSA Assay (FDA approved for NASAL specimens only), is one component of a comprehensive MRSA colonization surveillance program. It is not intended to diagnose MRSA infection nor to guide or monitor treatment for MRSA infections. Test performance is not FDA approved in patients less than 7 years old. Performed at [facility], [address] [place], [place] [address]   Body fluid culture w Gram Stain     Status: None (Preliminary result)   Collection Time: 06/08/23  3:01 PM   Specimen: PATH Cytology Pleural fluid  Result Value Ref Range Status   Specimen  Description   Final    PLEURAL LEFT SIDE Performed at [facility], [address] [place], [place] [address]    Special Requests   Final    NONE Performed at [facility], [address] [place], [place] [address]    Gram Stain NO WBC SEEN NO ORGANISMS SEEN   Final   Culture   Final  NO GROWTH < 24 HOURS Performed at [facility], [address] [place], [place], [place] [address]    Report Status PENDING  Incomplete         Radiology Studies: DG Chest Port 1 View Result Date: 06/08/2023 CLINICAL DATA:  [id number] S/P left thoracentesis [id number] EXAM: PORTABLE CHEST 1 VIEW COMPARISON:  06/07/2023 chest radiograph. FINDINGS: Stable cardiomediastinal silhouette with mild cardiomegaly. No pneumothorax. Trace residual left pleural effusion, decreased. No right pleural effusion. Cephalization of the pulmonary vasculature without overt pulmonary edema. Left retrocardiac and streaky left lung base opacity, decreased. IMPRESSION: 1. No pneumothorax. Trace residual left pleural effusion, decreased. 2. Left retrocardiac and streaky left lung base opacity, decreased, favor atelectasis. 3. Mild cardiomegaly without overt pulmonary edema. Electronically Signed   By: Selinda DELENA Blue M.D.   On: 06/08/2023 16:55   US  THORACENTESIS ASP PLEURAL SPACE W/IMG GUIDE Result Date: 06/08/2023 INDICATION: 58 year old female with non-small cell lung adenocarcinoma, now with left pleural effusion. Request for diagnostic and therapeutic thoracentesis. EXAM: ULTRASOUND GUIDED LEFT THORACENTESIS MEDICATIONS: 10 mL 1% lidocaine . COMPLICATIONS: None immediate. PROCEDURE: An ultrasound guided thoracentesis was thoroughly discussed with the patient and questions answered. The benefits, risks, alternatives and complications were also discussed. The patient understands and wishes to proceed with the procedure. Written consent was obtained. Ultrasound was performed to localize and mark an adequate  pocket of fluid in the left chest. The area was then prepped and draped in the normal sterile fashion. 1% Lidocaine  was used for local anesthesia. Under ultrasound guidance a 6 Fr Safe-T-Centesis catheter was introduced. Thoracentesis was performed. The catheter was removed and a dressing applied. FINDINGS: A total of approximately 400 mL of yellow fluid was removed. Samples were sent to the laboratory as requested by the clinical team. IMPRESSION: Successful ultrasound guided left thoracentesis yielding 400 mL of pleural fluid. Performed by: Kacie Matthews PA-C Electronically Signed   By: JONETTA Faes M.D.   On: 06/08/2023 15:38   DG CHEST PORT 1 VIEW Result Date: 06/07/2023 CLINICAL DATA:  Shortness of breath and weakness. EXAM: PORTABLE CHEST 1 VIEW COMPARISON:  Chest CT dated 06/06/2023. FINDINGS: Small left pleural effusion and left lung base atelectasis or infiltrate. The right lung is clear. No pneumothorax. Stable cardiac silhouette. No acute osseous pathology. IMPRESSION: Small left pleural effusion and left lung base atelectasis or infiltrate. A Electronically Signed   By: Vanetta Chou M.D.   On: 06/07/2023 15:06        Scheduled Meds:  amLODipine   10 mg Oral Daily   atorvastatin   20 mg Oral Q M,W,F   benztropine   0.5 mg Oral QHS   cholecalciferol   5,000 Units Oral Daily   doxycycline   100 mg Oral Q12H   fenofibrate   160 mg Oral Daily   folic acid   1 mg Oral Daily   gabapentin   200 mg Oral QHS   labetalol   450 mg Oral BID   levothyroxine   88 mcg Oral Q0600   lurasidone   40 mg Oral Q breakfast   nystatin    Topical TID   pantoprazole   40 mg Oral Daily   polyethylene glycol  17 g Oral BID   Continuous Infusions:  ceFEPime  (MAXIPIME ) IV Stopped (06/09/23 0548)     LOS: 3 days    Time spent: 35 minutes    Candiss Galeana A Ferman Basilio, MD [facility]   If 7PM-7AM, please contact night-coverage [email or web address]  06/09/2023, 1:53 PM

## 2023-06-09 NOTE — Plan of Care (Signed)

## 2023-06-09 NOTE — Progress Notes (Signed)
 Physical Therapy Treatment Patient Details Name: Madison Palmer MRN: [id number] DOB: 03/09/1966 Today's Date: 06/09/2023   History of Present Illness Madison Palmer is a 58 y.o. female presents to ED 2//25  with progressive Lower extremity weakness, SOB,  CT angiogram of the chest was negative for PE but did show a moderate-sized left pleural effusion with  also evidence of metastatic disease. Labs show mild hypokalemia macrocytic anemia and neutropenia.  EFY:dujhz IV lung cancer on chemotherapy and Keytruda  last dose was on 05/29/2023 with history of diastolic CHF, hypertension, hyperlipidemia, sleep apnea, depression, hypothyroidism, morbid obesity    PT Comments  Pt received supine in bed asleep but arousable to voice and agreeable to be seen, motivated to participate. Pt required maxA for bed mobility, sat EOB for extended period with distant SUP, stating I need to work myself up to this, anxious and tearful at times. Pt requesting BSC, able to complete transfer with bari RW and minA, voided BM, and required minA for STS from [facility] dependent for hygiene, able to stand during hygiene then stating I have to sit, directed pt to ambulate the ~57ft to bedrail, pt able to ambulate with bari RW with minA for steadying, then sit EOB. Pt dependent repositioning in supine. Anxiety and affect are the main barriers to therapy, though pt does express motivation to work. Discharge destination remains appropriate, we will continue to follow.     If plan is discharge home, recommend the following: A little help with walking and/or transfers;Assistance with cooking/housework;Assist for transportation;Help with stairs or ramp for entrance;A little help with bathing/dressing/bathroom   Can travel by private vehicle        Equipment Recommendations  Rolling walker (2 wheels)    Recommendations for Other Services       Precautions / Restrictions Precautions Precautions: Fall Precaution Comments: new O2  needs/monitor sats Restrictions Weight Bearing Restrictions Per Provider Order: No     Mobility  Bed Mobility Overal bed mobility: Needs Assistance Bed Mobility: Supine to Sit, Sit to Supine     Supine to sit: Max assist Sit to supine: Max assist   General bed mobility comments: Pt requesting to go to the bathroom so assisted from supine to EOB with Max Assist for upper body while pt was self able to swing legs around with increased time and effort to complete scooting to EOB. For supine to sit pt max assist to bring BLe into bed and adjust positioning supine dependently.    Transfers Overall transfer level: Needs assistance Equipment used: Rolling walker (2 wheels) (Bariatric RW) Transfers: Bed to chair/wheelchair/BSC, Sit to/from Stand Sit to Stand: +2 safety/equipment, Min assist, From elevated surface   Step pivot transfers: Min assist, +2 safety/equipment, From elevated surface       General transfer comment: Pt was able to stand pivot from elevated bed to 1/4 turn BSC with bariatric RW  Pt voided BM.  Then assist with sit to stand and dependent with peri care as pt was unable to slight instability needing to support.  Pt was able to complete step pivot transfer return to bed with minA for steadying.    Ambulation/Gait Ambulation/Gait assistance: Min assist, +2 safety/equipment Gait Distance (Feet): 3 Feet Assistive device: Rolling walker (2 wheels) (bari walker) Gait Pattern/deviations: Step-to pattern, Trunk flexed, Wide base of support, Shuffle Gait velocity: decreased     General Gait Details: Pt ambulated very short distance EOB with trunk flexed and WBOS with minA for steadying, no overt LOB, pt  anxious, distance limited by fatigue/anxiety   Stairs             Wheelchair Mobility     Tilt Bed    Modified Rankin (Stroke Patients Only)       Balance Overall balance assessment: Needs assistance Sitting-balance support: Feet supported Sitting  balance-Leahy Scale: Fair Sitting balance - Comments: unchallenged EOB and BSC   Standing balance support: Bilateral upper extremity supported, Reliant on assistive device for balance Standing balance-Leahy Scale: Poor Standing balance comment: poor activity tolerance in standing                            Cognition Arousal: Alert Behavior During Therapy: Flat affect, Anxious (emotional) Overall Cognitive Status: Difficult to assess                                 General Comments: Emotional and very nervous/anxious.  Trembling.  Following all instructions and able to express needs.        Exercises      General Comments        Pertinent Vitals/Pain Pain Assessment Pain Assessment: No/denies pain    Home Living                          Prior Function            PT Goals (current goals can now be found in the care plan section) Acute Rehab PT Goals Patient Stated Goal: agreed to sitting PT Goal Formulation: With patient Time For Goal Achievement: 06/20/23 Potential to Achieve Goals: Fair Progress towards PT goals: Progressing toward goals    Frequency    Min 1X/week      PT Plan      Co-evaluation              AM-PAC PT 6 Clicks Mobility   Outcome Measure  Help needed turning from your back to your side while in a flat bed without using bedrails?: A Lot Help needed moving from lying on your back to sitting on the side of a flat bed without using bedrails?: A Lot Help needed moving to and from a bed to a chair (including a wheelchair)?: A Lot Help needed standing up from a chair using your arms (e.g., wheelchair or bedside chair)?: A Lot Help needed to walk in hospital room?: A Lot Help needed climbing 3-5 steps with a railing? : Total 6 Click Score: 11    End of Session Equipment Utilized During Treatment: Gait belt Activity Tolerance: Patient limited by fatigue Patient left: in bed;with call bell/phone  within reach;with chair alarm set Nurse Communication: Mobility status PT Visit Diagnosis: Unsteadiness on feet (R26.81);Muscle weakness (generalized) (M62.81);Difficulty in walking, not elsewhere classified (R26.2);Pain Pain - Right/Left: Left Pain - part of body: Leg     Time: 8366-8288 PT Time Calculation (min) (ACUTE ONLY): 38 min  Charges:    $Therapeutic Activity: 38-52 mins PT General Charges $$ ACUTE PT VISIT: 1 Visit                     Elsie Grieves, PT, DPT [facility] Rehabilitation Department Office: [phone]   Elsie Grieves 06/09/2023, 5:32 PM

## 2023-06-10 DIAGNOSIS — R531 Weakness: Secondary | ICD-10-CM | POA: Diagnosis not present

## 2023-06-10 LAB — BASIC METABOLIC PANEL
Anion gap: 7 (ref 5–15)
BUN: 34 mg/dL — ABNORMAL HIGH (ref 6–20)
CO2: 35 mmol/L — ABNORMAL HIGH (ref 22–32)
Calcium: 10.7 mg/dL — ABNORMAL HIGH (ref 8.9–10.3)
Chloride: 103 mmol/L (ref 98–111)
Creatinine, Ser: 1.5 mg/dL — ABNORMAL HIGH (ref 0.44–1.00)
GFR, Estimated: 40 mL/min — ABNORMAL LOW (ref >60)
Glucose, Bld: 118 mg/dL — ABNORMAL HIGH (ref 70–99)
Potassium: 4.3 mmol/L (ref 3.5–5.1)
Sodium: 145 mmol/L (ref 135–145)

## 2023-06-10 LAB — CBC WITH DIFFERENTIAL/PLATELET
Abs Immature Granulocytes: 0.65 10*3/uL — ABNORMAL HIGH (ref 0.00–0.07)
Basophils Absolute: 0 10*3/uL (ref 0.0–0.1)
Basophils Relative: 1 %
Eosinophils Absolute: 0 10*3/uL (ref 0.0–0.5)
Eosinophils Relative: 0 %
HCT: 26.4 % — ABNORMAL LOW (ref 36.0–46.0)
Hemoglobin: 8.2 g/dL — ABNORMAL LOW (ref 12.0–15.0)
Immature Granulocytes: 14 %
Lymphocytes Relative: 15 %
Lymphs Abs: 0.7 10*3/uL (ref 0.7–4.0)
MCH: 34.3 pg — ABNORMAL HIGH (ref 26.0–34.0)
MCHC: 31.1 g/dL (ref 30.0–36.0)
MCV: 110.5 fL — ABNORMAL HIGH (ref 80.0–100.0)
Monocytes Absolute: 0.5 10*3/uL (ref 0.1–1.0)
Monocytes Relative: 11 %
Neutro Abs: 2.8 10*3/uL (ref 1.7–7.7)
Neutrophils Relative %: 59 %
Platelets: 11 10*3/uL — CL (ref 150–400)
RBC: 2.39 MIL/uL — ABNORMAL LOW (ref 3.87–5.11)
RDW: 16.6 % — ABNORMAL HIGH (ref 11.5–15.5)
WBC: 4.7 10*3/uL (ref 4.0–10.5)
nRBC: 0.4 % — ABNORMAL HIGH (ref 0.0–0.2)

## 2023-06-10 LAB — TYPE AND SCREEN
ABO/RH(D): O POS
Antibody Screen: NEGATIVE

## 2023-06-10 LAB — ABO/RH: ABO/RH(D): O POS

## 2023-06-10 MED ORDER — FOLIC ACID 1 MG PO TABS
1.0000 mg | ORAL_TABLET | Freq: Once | ORAL | Status: AC
  Administered 2023-06-10: 1 mg via ORAL
  Filled 2023-06-10: qty 1

## 2023-06-10 MED ORDER — CYANOCOBALAMIN 1000 MCG/ML IJ SOLN
1000.0000 ug | Freq: Once | INTRAMUSCULAR | Status: AC
  Administered 2023-06-10: 1000 ug via INTRAMUSCULAR
  Filled 2023-06-10: qty 1

## 2023-06-10 MED ORDER — FOLIC ACID 1 MG PO TABS
2.0000 mg | ORAL_TABLET | Freq: Every day | ORAL | Status: DC
  Administered 2023-06-11 – 2023-06-15 (×5): 2 mg via ORAL
  Filled 2023-06-10 (×5): qty 2

## 2023-06-10 MED ORDER — SODIUM CHLORIDE 0.9% IV SOLUTION: Freq: Once | INTRAVENOUS | Status: AC

## 2023-06-10 MED ORDER — FUROSEMIDE 10 MG/ML IJ SOLN
20.0000 mg | Freq: Once | INTRAMUSCULAR | Status: AC
  Administered 2023-06-10: 20 mg via INTRAVENOUS
  Filled 2023-06-10: qty 2

## 2023-06-10 NOTE — Progress Notes (Signed)
   06/10/23 0105  BiPAP/CPAP/SIPAP  BiPAP/CPAP/SIPAP Pt Type Adult  BiPAP/CPAP/SIPAP Resmed  Mask Type Nasal mask  Mask Size Medium  EPAP 9 cmH2O  Flow Rate 3 lpm  Patient Home Equipment No  Auto Titrate No  BiPAP/CPAP /SiPAP Vitals  Resp 18  MEWS Score/Color  MEWS Score 0  MEWS Score Color Green

## 2023-06-10 NOTE — Progress Notes (Signed)
 PROGRESS NOTE    Madison Palmer  FMW:[id number] DOB: 1965-11-30 DOA: 06/05/2023 PCP: Katheen Roselie Rockford, NP   Brief Narrative: 58 year old with past medical history significant for stage IV lung cancer on chemotherapy, and Keytruda  last dose was 05/29/2023, history of diastolic heart failure, hypertension, hyperlipidemia, sleep apnea, depression, hypothyroidism, who presented with progressive lower extremity weakness over the last 2 weeks.  Patient states that her weakness was initially on the right side and in the last few days presents on both lower extremities.  Patient recently had follow-up with oncologist Dr. Gatha on 1/28 when MRI of the brain was planted.  MRI of the brain 1/21st was negative for acute finding.   Assessment & Plan:   Principal Problem:   Lower extremity weakness Active Problems:   Hypothyroidism   Obstructive sleep apnea   Essential hypertension   Chronic diastolic HF (heart failure) (HCC)   Class 3 severe obesity with serious comorbidity and body mass index (BMI) of 50.0 to 59.9 in adult [facility])   Adenocarcinoma of left lung, stage 4 (HCC)   Weakness   Neutropenia (HCC)   Neuropathy   Macrocytic anemia   Thrombocytopenia (HCC)   1-Bilateral lower extremity weakness:  -Admitted discussed case with on-call neurology, who thought her symptoms were more related to deconditioning. -MRI brain: no acute finding.  -MR Lumbar:  7 mm lesion within the S1 segment, with additional 8 mm lesion within the left sacral ala. Given history of metastatic non-small cell lung cancer, findings are concerning for small osseous metastases. No extra osseous or epidural tumor. Right foraminal to extraforaminal disc protrusion at L2-3, potentially affecting the exiting right L2 nerve root. Moderate to advanced lower lumbar facet hypertrophy, which could contribute to lower back pain.  -Weakness improving.  -Discussed MRI spine with Dr Tressia, no surgical intervention needed, Out  patient follow up.  Needs PT She agrees to go to CIR>   2-Neutropenia, History of stage IV Lung cancer;  -Neutropenia in the setting of recent chemotherapy Pancytopenia, in setting chemotherapy.  Cussed with Dr. Gatha plan to continue with Granix  on the ANC more than 1500 Improving. WBC increase to 4 Platelet down to 11, plan to proceed with 2 units platelet. Discussed with Dr onesimo.   3-Left pleural effusion concern with malignancy versus loculated -Discussed with Dr. Gatha we can proceed with thoracentesis when white blood cell counts improved -Continue  IV antibiotics to cover for infectious process -IV cefepime  . Vanc change to Doxy due to renal insufficiency.  -2/6: Worsening SOB in the  afternoon, oxygen requirement increase to 4 L.  -Chest x ray small effusion. CCM consulted recommend thoracentesis by IR>  -IR consulted for thoracentesis. -Underwent Thoracentesis 400 cc of fluid removed. Follow culture. No growth to date.   4-Panniculitis, pubic area with redness and sluggish skin.  Concern with superimposed bacterial cellulitis -Continue with  IV antibiotics -Nystatin  powder  Hypertension: Continue with labetalol ,  amlodipine  Hold lisinopril  due to increase cr  Increase Cr; monitor.  Encourage to drink fluids.  Holding Lisinopril .  Previous Cr 2 weeks ago 1.4  Abnormal rhythm on monitor per nurse. Mg replaced.   Hyperlipidemia: Continue with the statins and fenofibrate   Lipid apnea, continue with CPAP  Normocytic  anemia: Follow anemia panel  thrombocytopenia in the setting of chemotherapy  Diastolic heart failure chronic compensated -resume lasix  tomorrow  Depression: Continue Latuda  and Cogentin  Anxiety, agitation last night; had haldol . Atarax  PRN  Neuropathy: Continue with gabapentin  Mild hypokalemia: Replace  Severe  Obesity ; needs life style modifications.   Estimated body mass index is 51.85 kg/m as calculated from the following:   Height as  of this encounter: 5' 2 (1.575 m).   Weight as of this encounter: 128.6 kg.   DVT prophylaxis: SCDs Code Status: Full code Family Communication: Care discussed with patient Disposition Plan:  Status is: Observation The patient will require care spanning > 2 midnights and should be moved to inpatient because: Management of neutropenia, weakness pleural effusion    Consultants:  Oncology   Procedures:  none  Antimicrobials:    Subjective: Report breathing ok, no evidence of active bleeding.    Objective: Vitals:   06/10/23 1310 06/10/23 1320 06/10/23 1335 06/10/23 1336  BP: 121/78  (!) 140/72 (!) 140/72  Pulse: 90  91 91  Resp: (!) 27 (!) 27 20 20   Temp: 98.1 F (36.7 C)  98.3 F (36.8 C) 98.3 F (36.8 C)  TempSrc: Oral  Oral Oral  SpO2: 92%  91% 91%  Weight:      Height:        Intake/Output Summary (Last 24 hours) at 06/10/2023 1417 Last data filed at 06/10/2023 1335 Gross per 24 hour  Intake 1070 ml  Output 1000 ml  Net 70 ml   Filed Weights   06/05/23 1621 06/06/23 1605  Weight: 127.6 kg 128.6 kg    Examination:  General exam: Morbid Obese.  Respiratory system: BL air movement Cardiovascular system: S 1, S 2 RRR Gastrointestinal system: Alert follows command Extremities: She is able to move bilateral lower lower extremities   Data Reviewed: I have personally reviewed following labs and imaging studies  CBC: Recent Labs  Lab 06/05/23 1638 06/06/23 0557 06/07/23 0953 06/08/23 0844 06/09/23 0711 06/10/23 0651  WBC 1.0* 0.7* 1.4* 2.7* 3.9* 4.7  NEUTROABS 0.4* 0.2* 1.0* 1.9  --  2.8  HGB 10.5* 9.5* 9.2* 8.0* 8.1* 8.2*  HCT 32.5* 29.8* 29.1* 25.7* 26.4* 26.4*  MCV 106.6* 108.4* 109.4* 110.3* 110.0* 110.5*  PLT 109* 83* 42* 29* 17* 11*   Basic Metabolic Panel: Recent Labs  Lab 06/06/23 0557 06/07/23 0953 06/07/23 1418 06/08/23 0844 06/09/23 0711 06/10/23 0651  NA 140 141  --  140 143 145  K 3.7 3.8  --  3.9 4.2 4.3  CL 95* 99  --  98  101 103  CO2 34* 34*  --  33* 34* 35*  GLUCOSE 122* 196*  --  119* 118* 118*  BUN 21* 28*  --  33* 37* 34*  CREATININE 1.02* 1.33*  --  1.58* 1.53* 1.50*  CALCIUM  9.9 10.2  --  10.2 10.4* 10.7*  MG 2.0  --  1.8 2.1  --   --    GFR: Estimated Creatinine Clearance: 53.2 mL/min (A) (by C-G formula based on SCr of 1.5 mg/dL (H)). Liver Function Tests: Recent Labs  Lab 06/06/23 0557 06/07/23 0953  AST 39 26  ALT 33 26  ALKPHOS 60 58  BILITOT 2.1* 1.3*  PROT 6.5 6.1*  ALBUMIN 2.7* 2.4*   No results for input(s): LIPASE, AMYLASE in the last 168 hours. No results for input(s): AMMONIA in the last 168 hours. Coagulation Profile: No results for input(s): INR, PROTIME in the last 168 hours. Cardiac Enzymes: Recent Labs  Lab 06/06/23 0557  CKTOTAL 66   BNP (last 3 results) No results for input(s): PROBNP in the last 8760 hours. HbA1C: No results for input(s): HGBA1C in the last 72 hours. CBG: No results for  input(s): GLUCAP in the last 168 hours. Lipid Profile: No results for input(s): CHOL, HDL, LDLCALC, TRIG, CHOLHDL, LDLDIRECT in the last 72 hours. Thyroid  Function Tests: No results for input(s): TSH, T4TOTAL, FREET4, T3FREE, THYROIDAB in the last 72 hours.  Anemia Panel: No results for input(s): VITAMINB12, FOLATE, FERRITIN, TIBC, IRON, RETICCTPCT in the last 72 hours.  Sepsis Labs: No results for input(s): PROCALCITON, LATICACIDVEN in the last 168 hours.  Recent Results (from the past 240 hours)  Culture, blood (Routine X 2) w Reflex to ID Panel     Status: None (Preliminary result)   Collection Time: 06/06/23  8:55 AM   Specimen: BLOOD LEFT WRIST  Result Value Ref Range Status   Specimen Description   Final    BLOOD LEFT WRIST Performed at [facility], [address] [place], [place] [address]    Special Requests   Final    BOTTLES DRAWN AEROBIC AND ANAEROBIC Blood Culture results may not be optimal due  to an inadequate volume of blood received in culture bottles Performed at [facility], [address] [place], [place] [address]    Culture   Final    NO GROWTH 4 DAYS Performed at [facility], [address], [place], [place] [address]    Report Status PENDING  Incomplete  Culture, blood (Routine X 2) w Reflex to ID Panel     Status: None (Preliminary result)   Collection Time: 06/06/23  8:58 AM   Specimen: BLOOD RIGHT WRIST  Result Value Ref Range Status   Specimen Description   Final    BLOOD RIGHT WRIST Performed at [facility], [address], [place], [place] [address]    Special Requests   Final    BOTTLES DRAWN AEROBIC AND ANAEROBIC Blood Culture results may not be optimal due to an inadequate volume of blood received in culture bottles Performed at [facility], [address] [place], [place] [address]    Culture   Final    NO GROWTH 4 DAYS Performed at [facility], [address] [place], [place] [address]    Report Status PENDING  Incomplete  MRSA Next Gen by PCR, Nasal     Status: None   Collection Time: 06/06/23  5:39 PM   Specimen: Nasal Mucosa; Nasal Swab  Result Value Ref Range Status   MRSA by PCR Next Gen NOT DETECTED NOT DETECTED Final    Comment: (NOTE) The GeneXpert MRSA Assay (FDA approved for NASAL specimens only), is one component of a comprehensive MRSA colonization surveillance program. It is not intended to diagnose MRSA infection nor to guide or monitor treatment for MRSA infections. Test performance is not FDA approved in patients less than 29 years old. Performed at [facility], [address] [place], [place] [address]   Body fluid culture w Gram Stain     Status: None (Preliminary result)   Collection Time: 06/08/23  3:01 PM   Specimen: PATH Cytology Pleural fluid  Result Value Ref Range Status   Specimen Description   Final    PLEURAL LEFT SIDE Performed  at [facility], [address], [place], [place] [address]    Special Requests   Final    NONE Performed at [facility], [address] [place], [place] [address]    Gram Stain NO WBC SEEN NO ORGANISMS SEEN   Final   Culture   Final  NO GROWTH 2 DAYS Performed at [facility], [address], [place], [place] [address]    Report Status PENDING  Incomplete         Radiology Studies: DG Chest Port 1 View Result Date: 06/08/2023 CLINICAL DATA:  [id number] S/P left thoracentesis [id number] EXAM: PORTABLE CHEST 1 VIEW COMPARISON:  06/07/2023 chest radiograph. FINDINGS: Stable cardiomediastinal silhouette with mild cardiomegaly. No pneumothorax. Trace residual left pleural effusion, decreased. No right pleural effusion. Cephalization of the pulmonary vasculature without overt pulmonary edema. Left retrocardiac and streaky left lung base opacity, decreased. IMPRESSION: 1. No pneumothorax. Trace residual left pleural effusion, decreased. 2. Left retrocardiac and streaky left lung base opacity, decreased, favor atelectasis. 3. Mild cardiomegaly without overt pulmonary edema. Electronically Signed   By: Selinda DELENA Blue M.D.   On: 06/08/2023 16:55   US  THORACENTESIS ASP PLEURAL SPACE W/IMG GUIDE Result Date: 06/08/2023 INDICATION: 58 year old female with non-small cell lung adenocarcinoma, now with left pleural effusion. Request for diagnostic and therapeutic thoracentesis. EXAM: ULTRASOUND GUIDED LEFT THORACENTESIS MEDICATIONS: 10 mL 1% lidocaine . COMPLICATIONS: None immediate. PROCEDURE: An ultrasound guided thoracentesis was thoroughly discussed with the patient and questions answered. The benefits, risks, alternatives and complications were also discussed. The patient understands and wishes to proceed with the procedure. Written consent was obtained. Ultrasound was performed to localize and mark an adequate pocket of fluid in the left chest. The area was then  prepped and draped in the normal sterile fashion. 1% Lidocaine  was used for local anesthesia. Under ultrasound guidance a 6 Fr Safe-T-Centesis catheter was introduced. Thoracentesis was performed. The catheter was removed and a dressing applied. FINDINGS: A total of approximately 400 mL of yellow fluid was removed. Samples were sent to the laboratory as requested by the clinical team. IMPRESSION: Successful ultrasound guided left thoracentesis yielding 400 mL of pleural fluid. Performed by: Kacie Matthews PA-C Electronically Signed   By: JONETTA Faes M.D.   On: 06/08/2023 15:38        Scheduled Meds:  amLODipine   10 mg Oral Daily   atorvastatin   20 mg Oral Q M,W,F   benztropine   0.5 mg Oral QHS   cholecalciferol   5,000 Units Oral Daily   cyanocobalamin   1,000 mcg Intramuscular Once   doxycycline   100 mg Oral Q12H   fenofibrate   160 mg Oral Daily   [START ON 06/11/2023] folic acid   2 mg Oral Daily   furosemide   20 mg Intravenous Once   gabapentin   200 mg Oral QHS   labetalol   450 mg Oral BID   levothyroxine   88 mcg Oral Q0600   lurasidone   40 mg Oral Q breakfast   nystatin    Topical TID   pantoprazole   40 mg Oral Daily   polyethylene glycol  17 g Oral BID   Continuous Infusions:  ceFEPime  (MAXIPIME ) IV 2 g (06/10/23 0657)     LOS: 4 days    Time spent: 35 minutes    Cassius Cullinane A [place], MD [facility]   If 7PM-7AM, please contact night-coverage [email or web address]  06/10/2023, 2:17 PM

## 2023-06-10 NOTE — Plan of Care (Signed)

## 2023-06-10 NOTE — Plan of Care (Signed)
  Problem: Education: Goal: Knowledge of General Education information will improve Description: Including pain rating scale, medication(s)/side effects and non-pharmacologic comfort measures Outcome: Progressing   Problem: Clinical Measurements: Goal: Will remain free from infection Outcome: Progressing   Problem: Clinical Measurements: Goal: Diagnostic test results will improve Outcome: Progressing   Problem: Clinical Measurements: Goal: Respiratory complications will improve Outcome: Progressing   Problem: Activity: Goal: Risk for activity intolerance will decrease Outcome: Progressing

## 2023-06-10 NOTE — Progress Notes (Signed)
 PT Cancellation Note  Patient Details Name: MARKEISHA MANCIAS MRN: [id number] DOB: 18-May-1965   Cancelled Treatment:    Reason Eval/Treat Not Completed: Medical issues which prohibited therapy RN reports pt is getting PLTs, lasix  and then more PLTs today.  Will check back as schedule permits.   Kati L Payson 06/10/2023, 1:37 PM

## 2023-06-11 DIAGNOSIS — R531 Weakness: Secondary | ICD-10-CM | POA: Diagnosis not present

## 2023-06-11 DIAGNOSIS — C7951 Secondary malignant neoplasm of bone: Secondary | ICD-10-CM

## 2023-06-11 DIAGNOSIS — C787 Secondary malignant neoplasm of liver and intrahepatic bile duct: Secondary | ICD-10-CM

## 2023-06-11 DIAGNOSIS — D6481 Anemia due to antineoplastic chemotherapy: Secondary | ICD-10-CM

## 2023-06-11 DIAGNOSIS — D61810 Antineoplastic chemotherapy induced pancytopenia: Secondary | ICD-10-CM

## 2023-06-11 DIAGNOSIS — T451X5D Adverse effect of antineoplastic and immunosuppressive drugs, subsequent encounter: Secondary | ICD-10-CM

## 2023-06-11 DIAGNOSIS — C3492 Malignant neoplasm of unspecified part of left bronchus or lung: Secondary | ICD-10-CM

## 2023-06-11 LAB — CBC WITH DIFFERENTIAL/PLATELET
Abs Immature Granulocytes: 0.17 10*3/uL — ABNORMAL HIGH (ref 0.00–0.07)
Basophils Absolute: 0 10*3/uL (ref 0.0–0.1)
Basophils Relative: 1 %
Eosinophils Absolute: 0 10*3/uL (ref 0.0–0.5)
Eosinophils Relative: 0 %
HCT: 28.3 % — ABNORMAL LOW (ref 36.0–46.0)
Hemoglobin: 8.6 g/dL — ABNORMAL LOW (ref 12.0–15.0)
Immature Granulocytes: 3 %
Lymphocytes Relative: 14 %
Lymphs Abs: 0.8 10*3/uL (ref 0.7–4.0)
MCH: 33.5 pg (ref 26.0–34.0)
MCHC: 30.4 g/dL (ref 30.0–36.0)
MCV: 110.1 fL — ABNORMAL HIGH (ref 80.0–100.0)
Monocytes Absolute: 0.5 10*3/uL (ref 0.1–1.0)
Monocytes Relative: 8 %
Neutro Abs: 4.4 10*3/uL (ref 1.7–7.7)
Neutrophils Relative %: 74 %
Platelets: 14 10*3/uL — CL (ref 150–400)
RBC: 2.57 MIL/uL — ABNORMAL LOW (ref 3.87–5.11)
RDW: 16.6 % — ABNORMAL HIGH (ref 11.5–15.5)
WBC: 5.9 10*3/uL (ref 4.0–10.5)
nRBC: 0 % (ref 0.0–0.2)

## 2023-06-11 LAB — PREPARE PLATELET PHERESIS
Unit division: 0
Unit division: 0

## 2023-06-11 LAB — BPAM PLATELET PHERESIS
Blood Product Expiration Date: 202502112359
Blood Product Expiration Date: 202502112359
ISSUE DATE / TIME: 202502091308
ISSUE DATE / TIME: 202502091427
Unit Type and Rh: 6200
Unit Type and Rh: 7300

## 2023-06-11 MED ORDER — LORAZEPAM 0.5 MG PO TABS
0.5000 mg | ORAL_TABLET | Freq: Once | ORAL | Status: AC
  Administered 2023-06-11: 0.5 mg via ORAL
  Filled 2023-06-11: qty 1

## 2023-06-11 MED ORDER — QUETIAPINE FUMARATE 25 MG PO TABS
50.0000 mg | ORAL_TABLET | Freq: Once | ORAL | Status: AC
  Administered 2023-06-11: 50 mg via ORAL
  Filled 2023-06-11: qty 2

## 2023-06-11 MED ORDER — SODIUM CHLORIDE 0.9% IV SOLUTION: Freq: Once | INTRAVENOUS | Status: AC

## 2023-06-11 NOTE — Progress Notes (Signed)
 Called blood bank to see about the platelets and told us  to give them 15 minutes and they'll bring them up to the floor. Told patient's nurse.

## 2023-06-11 NOTE — Plan of Care (Signed)
  Problem: Education: Goal: Knowledge of General Education information will improve Description: Including pain rating scale, medication(s)/side effects and non-pharmacologic comfort measures Outcome: Progressing   Problem: Health Behavior/Discharge Planning: Goal: Ability to manage health-related needs will improve Outcome: Progressing   Problem: Clinical Measurements: Goal: Ability to maintain clinical measurements within normal limits will improve Outcome: Progressing Goal: Will remain free from infection Outcome: Progressing Goal: Diagnostic test results will improve Outcome: Progressing Goal: Respiratory complications will improve Outcome: Progressing Goal: Cardiovascular complication will be avoided Outcome: Progressing   Problem: Activity: Goal: Risk for activity intolerance will decrease Outcome: Progressing   Problem: Nutrition: Goal: Adequate nutrition will be maintained Outcome: Progressing   Problem: Coping: Goal: Level of anxiety will decrease Outcome: Progressing   Problem: Elimination: Goal: Will not experience complications related to bowel motility Outcome: Progressing Goal: Will not experience complications related to urinary retention Outcome: Progressing   Problem: Pain Managment: Goal: General experience of comfort will improve and/or be controlled Outcome: Progressing   Problem: Safety: Goal: Ability to remain free from injury will improve Outcome: Progressing   Problem: Skin Integrity: Goal: Risk for impaired skin integrity will decrease Outcome: Progressing   Problem: Health Behavior/Discharge Planning: Goal: Ability to manage health-related needs will improve Outcome: Progressing   Problem: Health Behavior/Discharge Planning: Goal: Ability to manage health-related needs will improve Outcome: Progressing   Problem: Clinical Measurements: Goal: Ability to maintain clinical measurements within normal limits will improve Outcome:  Progressing   Problem: Clinical Measurements: Goal: Will remain free from infection Outcome: Progressing   Problem: Clinical Measurements: Goal: Diagnostic test results will improve Outcome: Progressing   Problem: Activity: Goal: Risk for activity intolerance will decrease Outcome: Progressing   Problem: Elimination: Goal: Will not experience complications related to bowel motility Outcome: Progressing   Problem: Coping: Goal: Level of anxiety will decrease Outcome: Progressing

## 2023-06-11 NOTE — Progress Notes (Signed)
 Pharmacy Antibiotic Note  Madison Palmer is a 58 y.o. female admitted on 06/05/2023 with panniculitis, pneumonia.  Pharmacy has been consulted for Cefepime  dosing.  Plan: Continue Cefepime  to 2g IV q12 hrs. Pharmacy will sign off notes, but will follow up for duration, de-escalation, clinical course.     Height: 5\' 2"  (157.5 cm) Weight: 128.6 kg (283 lb 8.2 oz) IBW/kg (Calculated) : 50.1  Temp (24hrs), Avg:98.1 F (36.7 C), Min:97.7 F (36.5 C), Max:98.4 F (36.9 C)  Recent Labs  Lab 06/06/23 0557 06/07/23 0953 06/08/23 0844 06/09/23 0711 06/10/23 0651 06/11/23 0839  WBC 0.7* 1.4* 2.7* 3.9* 4.7 5.9  CREATININE 1.02* 1.33* 1.58* 1.53* 1.50*  --     Estimated Creatinine Clearance: 53.2 mL/min (A) (by C-G formula based on SCr of 1.5 mg/dL (H)).    Allergies  Allergen Reactions   Other Other (See Comments)    Hydralazine  Hcl Other reaction(s): Other (See Comments) Instructed not to take by Cardiology.   Fentanyl  Hives    Other reaction(s): hives had fentanyl  recently with benadryl , did OK   Levofloxacin Hives   Midazolam  Hives   Pollen Extract     seasonal   Atorvastatin  Other (See Comments)    Muscle pain in legs  Abdominal pain   Hydralazine  Hcl Other (See Comments)    Hypercalcemia    Rosuvastatin Other (See Comments)    Abdominal pain Other reaction(s): diarrhea    Antimicrobials this admission: 2/5 vancomycin  >> 2/7 2/5 cefepime  >>  2/7 Doxy >>   Microbiology results: 2/5 BCx: NGTD 2/5 MRSA PCR: NEG 2/7 Pleural fluid: ngtd  Kendall Pauls PharmD, BCPS [facility] main pharmacy [phone] 06/11/2023 10:13 AM

## 2023-06-11 NOTE — Progress Notes (Signed)
 At 6.30 pm I called down to check the status of the platelet for this patient because at 3pm we were told to give them 15 mins and the platelets would be ready.  When I called the man told me that they probably didn't call as it may have been forgotten and not told to the other shift during shift change, however there are 2 units waiting for the patient.  He told me they are suppose to call when they are ready. Helen Loa RN

## 2023-06-11 NOTE — Progress Notes (Addendum)
 PROGRESS NOTE    Madison Palmer  ZOX:[id number] DOB: 10/04/1965 DOA: 06/05/2023 PCP: Kandace Organ, NP   Brief Narrative: 58 year old with past medical history significant for stage IV lung cancer on chemotherapy, and Keytruda  last dose was 05/29/2023, history of diastolic heart failure, hypertension, hyperlipidemia, sleep apnea, depression, hypothyroidism, who presented with progressive lower extremity weakness over the last 2 weeks.  Patient states that her weakness was initially on the right side and in the last few days presents on both lower extremities.  Patient recently had follow-up with oncologist Dr. Liam Redhead on 1/28 when MRI of the brain was planted.  MRI of the brain 1/21st was negative for acute finding.   Assessment & Plan:   Principal Problem:   Lower extremity weakness Active Problems:   Hypothyroidism   Obstructive sleep apnea   Essential hypertension   Chronic diastolic HF (heart failure) (HCC)   Class 3 severe obesity with serious comorbidity and body mass index (BMI) of 50.0 to 59.9 in adult [facility])   Adenocarcinoma of left lung, stage 4 (HCC)   Weakness   Neutropenia (HCC)   Neuropathy   Macrocytic anemia   Thrombocytopenia (HCC)   1-Bilateral lower extremity weakness:  -Admitted discussed case with on-call neurology, who thought her symptoms were more related to deconditioning. -MRI brain: no acute finding.  -MR Lumbar:  7 mm lesion within the S1 segment, with additional 8 mm lesion within the left sacral ala. Given history of metastatic non-small cell lung cancer, findings are concerning for small osseous metastases. No extra osseous or epidural tumor. Right foraminal to extraforaminal disc protrusion at L2-3, potentially affecting the exiting right L2 nerve root. Moderate to advanced lower lumbar facet hypertrophy, which could contribute to lower back pain.  -Weakness improving.  -Discussed MRI spine with Dr Sarah Cumber, no surgical intervention needed, Out  patient follow up.  Needs PT She agrees to go to CIR>   2-Neutropenia, History of stage IV Lung cancer;  -Neutropenia in the setting of recent chemotherapy Pancytopenia, in setting chemotherapy.  Cussed with Dr. Liam Redhead plan to continue with Granix  on the ANC more than 1500 Improving. WBC increase to 4 -Discussed 2 units Platelet 2/09/ for  platelet count of 11 -Platelet count today at 14. Will proceed with two more units. Oncology to see today.   3-Left pleural effusion concern with malignancy versus loculated Acute Hypoxic Resp failure -Discussed with Dr. Liam Redhead we can proceed with thoracentesis when white blood cell counts improved -Continue  IV antibiotics to cover for infectious process -IV cefepime  . Vanc change to Doxy due to renal insufficiency.  -2/6: Worsening SOB in the  afternoon, oxygen requirement increase to 4 L.  -Chest x ray small effusion. CCM consulted recommend thoracentesis by IR>  -IR consulted for thoracentesis. -Underwent Thoracentesis 400 cc of fluid removed. Follow culture. No growth to date.  Day 5/7 antibiotics.   4-Panniculitis, pubic area with redness and sluggish skin.  Concern with superimposed bacterial cellulitis -Continue with  IV antibiotics -Nystatin  powder  Hypertension: Continue with labetalol ,  amlodipine  Hold lisinopril  due to increase cr  Increase Cr; monitor.  Encourage to drink fluids.  Holding Lisinopril .  Previous Cr 2 weeks ago 1.4  Abnormal rhythm on monitor per nurse. Mg replaced.   Hyperlipidemia: Continue with the statins and fenofibrate   Lipid apnea, continue with CPAP  Normocytic  anemia: Follow anemia panel  thrombocytopenia in the setting of chemotherapy  Diastolic heart failure chronic compensated -Holding lasix  due to increase cr.  Depression: Continue Latuda  and Cogentin  Anxiety, agitation last night; had haldol . Atarax  PRN  Neuropathy: Continue with gabapentin  Mild hypokalemia: Replace  Severe  Obesity ; needs life style modifications.   Estimated body mass index is 51.85 kg/m as calculated from the following:   Height as of this encounter: 5\' 2"  (1.575 m).   Weight as of this encounter: 128.6 kg.   DVT prophylaxis: SCDs Code Status: Full code Family Communication: Care discussed with patient Disposition Plan:  Status is: Observation The patient will require care spanning > 2 midnights and should be moved to inpatient because: Management of neutropenia, weakness pleural effusion    Consultants:  Oncology   Procedures:  none  Antimicrobials:    Subjective: Breathing better. No new complaints.   Objective: Vitals:   06/10/23 2212 06/11/23 0354 06/11/23 1002 06/11/23 1017  BP: 118/62 125/74 129/76 129/76  Pulse: 89 83 87   Resp: 18 18    Temp: 98.3 F (36.8 C) 97.8 F (36.6 C)    TempSrc:      SpO2: 100% 100%    Weight:      Height:        Intake/Output Summary (Last 24 hours) at 06/11/2023 1242 Last data filed at 06/11/2023 0358 Gross per 24 hour  Intake 1353.44 ml  Output 2750 ml  Net -1396.56 ml   Filed Weights   06/05/23 1621 06/06/23 1605  Weight: 127.6 kg 128.6 kg    Examination:  General exam: Morbid Obese.  Respiratory system: BL air movement.  Cardiovascular system: S 1, S 2 RRR Gastrointestinal system: Alert, follows command Extremities: no edema   Data Reviewed: I have personally reviewed following labs and imaging studies  CBC: Recent Labs  Lab 06/06/23 0557 06/07/23 0953 06/08/23 0844 06/09/23 0711 06/10/23 0651 06/11/23 0839  WBC 0.7* 1.4* 2.7* 3.9* 4.7 5.9  NEUTROABS 0.2* 1.0* 1.9  --  2.8 4.4  HGB 9.5* 9.2* 8.0* 8.1* 8.2* 8.6*  HCT 29.8* 29.1* 25.7* 26.4* 26.4* 28.3*  MCV 108.4* 109.4* 110.3* 110.0* 110.5* 110.1*  PLT 83* 42* 29* 17* 11* 14*   Basic Metabolic Panel: Recent Labs  Lab 06/06/23 0557 06/07/23 0953 06/07/23 1418 06/08/23 0844 06/09/23 0711 06/10/23 0651  NA 140 141  --  140 143 145  K 3.7 3.8   --  3.9 4.2 4.3  CL 95* 99  --  98 101 103  CO2 34* 34*  --  33* 34* 35*  GLUCOSE 122* 196*  --  119* 118* 118*  BUN 21* 28*  --  33* 37* 34*  CREATININE 1.02* 1.33*  --  1.58* 1.53* 1.50*  CALCIUM  9.9 10.2  --  10.2 10.4* 10.7*  MG 2.0  --  1.8 2.1  --   --    GFR: Estimated Creatinine Clearance: 53.2 mL/min (A) (by C-G formula based on SCr of 1.5 mg/dL (H)). Liver Function Tests: Recent Labs  Lab 06/06/23 0557 06/07/23 0953  AST 39 26  ALT 33 26  ALKPHOS 60 58  BILITOT 2.1* 1.3*  PROT 6.5 6.1*  ALBUMIN 2.7* 2.4*   No results for input(s): "LIPASE", "AMYLASE" in the last 168 hours. No results for input(s): "AMMONIA" in the last 168 hours. Coagulation Profile: No results for input(s): "INR", "PROTIME" in the last 168 hours. Cardiac Enzymes: Recent Labs  Lab 06/06/23 0557  CKTOTAL 66   BNP (last 3 results) No results for input(s): "PROBNP" in the last 8760 hours. HbA1C: No results for input(s): "HGBA1C" in the last  72 hours. CBG: No results for input(s): "GLUCAP" in the last 168 hours. Lipid Profile: No results for input(s): "CHOL", "HDL", "LDLCALC", "TRIG", "CHOLHDL", "LDLDIRECT" in the last 72 hours. Thyroid  Function Tests: No results for input(s): "TSH", "T4TOTAL", "FREET4", "T3FREE", "THYROIDAB" in the last 72 hours.  Anemia Panel: No results for input(s): "VITAMINB12", "FOLATE", "FERRITIN", "TIBC", "IRON", "RETICCTPCT" in the last 72 hours.  Sepsis Labs: No results for input(s): "PROCALCITON", "LATICACIDVEN" in the last 168 hours.  Recent Results (from the past 240 hours)  Culture, blood (Routine X 2) w Reflex to ID Panel     Status: None (Preliminary result)   Collection Time: 06/06/23  8:55 AM   Specimen: BLOOD LEFT WRIST  Result Value Ref Range Status   Specimen Description   Final    BLOOD LEFT WRIST Performed at [facility], [address] [place], [place] [address]    Special Requests   Final    BOTTLES DRAWN AEROBIC AND ANAEROBIC Blood  Culture results may not be optimal due to an inadequate volume of blood received in culture bottles Performed at [facility], [address] [place], [place] [address]    Culture   Final    NO GROWTH 4 DAYS Performed at [facility], [address], [place], [place] [address]    Report Status PENDING  Incomplete  Culture, blood (Routine X 2) w Reflex to ID Panel     Status: None (Preliminary result)   Collection Time: 06/06/23  8:58 AM   Specimen: BLOOD RIGHT WRIST  Result Value Ref Range Status   Specimen Description   Final    BLOOD RIGHT WRIST Performed at [facility], [address], [place], [place] [address]    Special Requests   Final    BOTTLES DRAWN AEROBIC AND ANAEROBIC Blood Culture results may not be optimal due to an inadequate volume of blood received in culture bottles Performed at [facility], [address] [place], [place] [address]    Culture   Final    NO GROWTH 4 DAYS Performed at [facility], [address] [place], [place] [address]    Report Status PENDING  Incomplete  MRSA Next Gen by PCR, Nasal     Status: None   Collection Time: 06/06/23  5:39 PM   Specimen: Nasal Mucosa; Nasal Swab  Result Value Ref Range Status   MRSA by PCR Next Gen NOT DETECTED NOT DETECTED Final    Comment: (NOTE) The GeneXpert MRSA Assay (FDA approved for NASAL specimens only), is one component of a comprehensive MRSA colonization surveillance program. It is not intended to diagnose MRSA infection nor to guide or monitor treatment for MRSA infections. Test performance is not FDA approved in patients less than 58 years old. Performed at [facility], [address] [place], [place] [address]   Body fluid culture w Gram Stain     Status: None (Preliminary result)   Collection Time: 06/08/23  3:01 PM   Specimen: PATH Cytology Pleural fluid  Result Value Ref Range Status   Specimen Description    Final    PLEURAL LEFT SIDE Performed at [facility], [address], [place], [place] [address]    Special Requests   Final    NONE Performed at [facility], [address] [place], [place] [address]    Gram Stain NO WBC SEEN NO ORGANISMS SEEN   Final  Culture   Final    NO GROWTH 3 DAYS Performed at [facility], [address] [place], [place] [address]    Report Status PENDING  Incomplete         Radiology Studies: No results found.       Scheduled Meds:  amLODipine   10 mg Oral Daily   atorvastatin   20 mg Oral Q M,W,F   benztropine   0.5 mg Oral QHS   cholecalciferol   5,000 Units Oral Daily   doxycycline   100 mg Oral Q12H   fenofibrate   160 mg Oral Daily   folic acid   2 mg Oral Daily   gabapentin   200 mg Oral QHS   labetalol   450 mg Oral BID   levothyroxine   88 mcg Oral Q0600   lurasidone   40 mg Oral Q breakfast   nystatin    Topical TID   pantoprazole   40 mg Oral Daily   polyethylene glycol  17 g Oral BID   Continuous Infusions:  ceFEPime  (MAXIPIME ) IV 2 g (06/11/23 0448)     LOS: 5 days    Time spent: 35 minutes    Muaaz Brau A Aydee Mcnew, MD [facility]   If 7PM-7AM, please contact night-coverage [email or web address]  06/11/2023, 12:42 PM

## 2023-06-11 NOTE — Progress Notes (Signed)
 Madison Palmer   DOB:1966-03-30   UX#:[id number]      ASSESSMENT & PLAN:  1.  Non-small cell lung cancer (T2a, N3, M1c)  with bone and hepatic mets -Initially diagnosed May 2024.  Guardant360 was negative for actionable mutations.  Foundation 1 actionable mutations. - Status post 6 cycles palliative chemotherapy/immunotherapy with carbo, Alimta, and Keytruda  every 3 weeks.  First dose was given 10/10/2022.  On maintenance therapy with Alimta and Keytruda  every 3 weeks. - Patient last seen in outpatient oncology 05/29/2023.  Chemotherapy on hold during this hospitalization. - Medical oncology/Dr. Marguerita Shih following closely  2.  Lower extremity weakness/pain - At last outpatient oncology visit on 05/29/2023 patient complained of increasing weakness in her right leg with difficulties getting out of bathtub without assistance.  She also stated she used a scooter and noticed that the weakness was intermittent. - Patient admitted on 06/06/2023 with complaints of lower extremity weakness and pain. - MRI of spine done 06/05/2023 showed no acute abnormality within lumbar spine.  There was a 2.7 mm lesion within the S1 segment with additional 8 mm lesion in the left sacral ala, concerning for small osseous mets.  Patient did have moderate to advanced lower lumbar facet hypertrophy which could contribute to lower back pain. - Continue gabapentin  as ordered - Rehab recommendations appreciated. - Primary team following  3.  Pancytopenia: Thrombocytopenia, anemia, leukopenia -CBC done today.  Hemoglobin low 8.6.  No transfusional interventions warranted at this time.  Transfuse PRBC for hemoglobin <7.0. - Platelets low 14K today.  Recommend platelet transfusion today.  Transfuse platelets for counts <20 K or <50 K with active bleeding. - Monitor CBC with differential closely.   Code Status Full    Subjective:  Patient seen awake and alert laying in bed.  Noticed patient repeatedly poking her cheeks and  forehead with her fingers, reports that she is feeling very anxious.  Attempted to redirect patient to sing as she states she loves gospel music.  No shortness of breath is noted.  Denies chest pain, active bleeding, nausea, vomiting.  States her "legs are feeling weak".  No acute events noted overnight.  Objective:  Vitals:   06/11/23 1002 06/11/23 1017  BP: 129/76 129/76  Pulse: 87   Resp:    Temp:    SpO2:       Intake/Output Summary (Last 24 hours) at 06/11/2023 1116 Last data filed at 06/11/2023 0358 Gross per 24 hour  Intake 1353.44 ml  Output 2750 ml  Net -1396.56 ml     REVIEW OF SYSTEMS:   Constitutional: Denies fevers, chills or abnormal night sweats Eyes: Denies blurriness of vision, double vision or watery eyes Ears, nose, mouth, throat, and face: Denies mucositis or sore throat Respiratory: Denies cough, dyspnea or wheezes Cardiovascular: Denies palpitation, chest discomfort or lower extremity swelling Gastrointestinal:  Denies nausea, heartburn or change in bowel habits Skin: Denies abnormal skin rashes Lymphatics: Denies new lymphadenopathy or easy bruising Neurological: Denies numbness, tingling or new weaknesses Behavioral/Psych: +Anxiety All other systems were reviewed with the patient and are negative.  PHYSICAL EXAMINATION: ECOG PERFORMANCE STATUS: 3 - Symptomatic, >50% confined to bed  Vitals:   06/11/23 1002 06/11/23 1017  BP: 129/76 129/76  Pulse: 87   Resp:    Temp:    SpO2:     Filed Weights   06/05/23 1621 06/06/23 1605  Weight: 281 lb 4.9 oz (127.6 kg) 283 lb 8.2 oz (128.6 kg)    GENERAL: alert, +anxious appearing SKIN: +  RLE hyperpigmentation EYES: normal, conjunctiva are pink and non-injected, sclera clear OROPHARYNX: no exudate, no erythema and lips, buccal mucosa, and tongue normal  NECK: supple, thyroid  normal size, non-tender, without nodularity LYMPH: no palpable lymphadenopathy in the cervical, axillary or inguinal LUNGS: clear  to auscultation and percussion with normal breathing effort HEART: regular rate & rhythm and no murmurs and no lower extremity edema ABDOMEN: abdomen soft, non-tender and normal bowel sounds MUSCULOSKELETAL: no cyanosis of digits and no clubbing  PSYCH: alert & oriented x 3 with fluent speech NEURO: no focal motor/sensory deficits   All questions were answered. The patient knows to call the clinic with any problems, questions or concerns.   The total time spent in the appointment was 40 minutes encounter with patient including review of chart and various tests results, discussions about plan of care and coordination of care plan  Jacqualin Mate, NP 06/11/2023 11:16 AM    Labs Reviewed:  Lab Results  Component Value Date   WBC 5.9 06/11/2023   HGB 8.6 (L) 06/11/2023   HCT 28.3 (L) 06/11/2023   MCV 110.1 (H) 06/11/2023   PLT 14 (LL) 06/11/2023   Recent Labs    05/29/23 1038 06/05/23 1638 06/06/23 0557 06/07/23 0953 06/08/23 0844 06/09/23 0711 06/10/23 0651  NA 143   < > 140 141 140 143 145  K 3.2*   < > 3.7 3.8 3.9 4.2 4.3  CL 100   < > 95* 99 98 101 103  CO2 38*   < > 34* 34* 33* 34* 35*  GLUCOSE 134*   < > 122* 196* 119* 118* 118*  BUN 12   < > 21* 28* 33* 37* 34*  CREATININE 1.19*   < > 1.02* 1.33* 1.58* 1.53* 1.50*  CALCIUM  10.6*   < > 9.9 10.2 10.2 10.4* 10.7*  GFRNONAA 53*   < > >60 47* 38* 39* 40*  PROT 6.8  --  6.5 6.1*  --   --   --   ALBUMIN 3.5  --  2.7* 2.4*  --   --   --   AST 24  --  39 26  --   --   --   ALT 13  --  33 26  --   --   --   ALKPHOS 74  --  60 58  --   --   --   BILITOT 0.7  --  2.1* 1.3*  --   --   --    < > = values in this interval not displayed.    Studies Reviewed:  DG Chest Port 1 View Result Date: 06/08/2023 CLINICAL DATA:  [id number] S/P left thoracentesis [id number] EXAM: PORTABLE CHEST 1 VIEW COMPARISON:  06/07/2023 chest radiograph. FINDINGS: Stable cardiomediastinal silhouette with mild cardiomegaly. No pneumothorax. Trace residual left  pleural effusion, decreased. No right pleural effusion. Cephalization of the pulmonary vasculature without overt pulmonary edema. Left retrocardiac and streaky left lung base opacity, decreased. IMPRESSION: 1. No pneumothorax. Trace residual left pleural effusion, decreased. 2. Left retrocardiac and streaky left lung base opacity, decreased, favor atelectasis. 3. Mild cardiomegaly without overt pulmonary edema. Electronically Signed   By: Levell Reach M.D.   On: 06/08/2023 16:55   US  THORACENTESIS ASP PLEURAL SPACE W/IMG GUIDE Result Date: 06/08/2023 INDICATION: 58 year old female with non-small cell lung adenocarcinoma, now with left pleural effusion. Request for diagnostic and therapeutic thoracentesis. EXAM: ULTRASOUND GUIDED LEFT THORACENTESIS MEDICATIONS: 10 mL 1% lidocaine . COMPLICATIONS: None immediate. PROCEDURE:  An ultrasound guided thoracentesis was thoroughly discussed with the patient and questions answered. The benefits, risks, alternatives and complications were also discussed. The patient understands and wishes to proceed with the procedure. Written consent was obtained. Ultrasound was performed to localize and mark an adequate pocket of fluid in the left chest. The area was then prepped and draped in the normal sterile fashion. 1% Lidocaine  was used for local anesthesia. Under ultrasound guidance a 6 Fr Safe-T-Centesis catheter was introduced. Thoracentesis was performed. The catheter was removed and a dressing applied. FINDINGS: A total of approximately 400 mL of yellow fluid was removed. Samples were sent to the laboratory as requested by the clinical team. IMPRESSION: Successful ultrasound guided left thoracentesis yielding 400 mL of pleural fluid. Performed by: Kacie Matthews PA-C Electronically Signed   By: Nicoletta Barrier M.D.   On: 06/08/2023 15:38   DG CHEST PORT 1 VIEW Result Date: 06/07/2023 CLINICAL DATA:  Shortness of breath and weakness. EXAM: PORTABLE CHEST 1 VIEW COMPARISON:  Chest CT  dated 06/06/2023. FINDINGS: Small left pleural effusion and left lung base atelectasis or infiltrate. The right lung is clear. No pneumothorax. Stable cardiac silhouette. No acute osseous pathology. IMPRESSION: Small left pleural effusion and left lung base atelectasis or infiltrate. A Electronically Signed   By: Angus Bark M.D.   On: 06/07/2023 15:06   MR BRAIN WO CONTRAST Result Date: 06/07/2023 CLINICAL DATA:  Neuro deficit, acute, stroke suspected. EXAM: MRI HEAD WITHOUT CONTRAST TECHNIQUE: Multiplanar, multiecho pulse sequences of the brain and surrounding structures were obtained without intravenous contrast. COMPARISON:  MRI of the brain May 22, 2023. FINDINGS: Incomplete study due to patient inability to lie flat in the scanner for the duration of the study. Images obtained are partially degraded by motion. Brain: No acute infarction, hemorrhage, hydrocephalus, extra-axial collection or mass lesion. Vascular: Normal flow voids. Skull and upper cervical spine: Decreased T1 signal of calvarium, likely related to history of anemia. Sinuses/Orbits: Mild mucosal thickening in on the right maxillary sinus. The orbits are grossly unremarkable. Other: None. IMPRESSION: 1. Incomplete study. Images obtained are partially degraded by motion. 2. No acute intracranial abnormality identified. Electronically Signed   By: Katyucia  de Macedo Rodrigues M.D.   On: 06/07/2023 10:08   CT Angio Chest PE W and/or Wo Contrast Result Date: 06/06/2023 CLINICAL DATA:  Increasing weakness following chemotherapy last week. History of non-small cell lung cancer, chronic respiratory distress. Portable chest obtained prior to CT. EXAM: CT ANGIOGRAPHY CHEST WITH CONTRAST TECHNIQUE: Multidetector CT imaging of the chest was performed using the standard protocol during bolus administration of intravenous contrast. Multiplanar CT image reconstructions and MIPs were obtained to evaluate the vascular anatomy. RADIATION DOSE  REDUCTION: This exam was performed according to the departmental dose-optimization program which includes automated exposure control, adjustment of the mA and/or kV according to patient size and/or use of iterative reconstruction technique. CONTRAST:  OMNIPAQUE  IOHEXOL  350 MG/ML SOLN COMPARISON:  Chest CT with contrast 05/22/2023, PET-CT 03/15/2023. FINDINGS: Cardiovascular: There is moderate left main and three-vessel coronary artery calcific plaque. Mild cardiomegaly with a left chamber predominance and mild aortic atherosclerosis. There is no aortic or great vessel aneurysm, dissection or stenosis. The pulmonary arteries and veins are normal in caliber without evidence for arterial embolic disease. There is no pericardial effusion. Mediastinum/Nodes: No new adenopathy. The subcarinal lymph node measuring 1.6 cm in short axis was previously 1.5 cm. A left hilar lymph node is again noted measuring 1.4 cm in short axis on 4:59,  previously 1.4 cm. There is a stable borderline prominent low right paratracheal space lymph node. Axillary spaces are clear. The trachea, main bronchi, thoracic esophagus are unremarkable. Lungs/Pleura: There is increasing now moderate sized left pleural effusion, some of which appears to be at least partially loculated in the anterior lower chest. There is no right-sided effusion. Some of the left pleural fluid tracks into the inferior interlobar fissure, more than previously. There are numerous diffuse metastatic lung nodules bilaterally. Again, the largest is in the medial left lower lobe base measuring 4.9 cm on 12:82, previously 4.1 cm when measured in similar fashion. Index right lower lobe perihilar lesion on 12:65 is stable in size at 12 mm, masses another right lower lobe nodule measuring 1.7 cm on 12:82. Others are much smaller. Rest of the lungs exhibit mosaicism consistent with air trapping and small airways disease, without appreciable focal pneumonia. Upper Abdomen:  Diffuse innumerable hepatic metastases were better demonstrated on prior CT. In the phase of contrast in which the liver was imaged today it is difficult to distinguish individual metastases. Left adrenal nodule is stable. Minimal perihepatic ascites with no acute upper abdominal findings. Musculoskeletal: Bridging enthesopathy multiple thoracic spine levels. No destructive bone lesions in the thorax. Review of the MIP images confirms the above findings. IMPRESSION: 1. No evidence of arterial embolic disease. 2. Increasing now moderate sized left pleural effusion, some of which appears to be at least partially loculated in the anterior lower chest. 3. Numerous diffuse metastatic lung nodules bilaterally, the largest in the medial left lower lobe base measuring 4.9 cm, previously 4.1 cm. 4. Stable mediastinal and left hilar adenopathy. 5. Diffuse innumerable hepatic metastases were better demonstrated on prior CT. In the phase of contrast in which the liver was imaged today it is difficult to distinguish individual metastases. 6. Stable left adrenal nodule. 7. Aortic and coronary artery atherosclerosis. Mild cardiomegaly. 8. Mosaic lung attenuation consistent with air trapping and small airways disease. Aortic Atherosclerosis (ICD10-I70.0). Electronically Signed   By: Denman Fischer M.D.   On: 06/06/2023 04:14   DG Chest Portable 1 View Result Date: 06/06/2023 CLINICAL DATA:  Shortness of breath. History of lung cancer with chemotherapy every 3 weeks. Recent chemotherapy last week. Increasing weakness over the last few days. EXAM: PORTABLE CHEST 1 VIEW COMPARISON:  CT chest abdomen and pelvis 05/22/2023. FINDINGS: Shallow inspiration. Mild cardiac enlargement. Infiltration or atelectasis in the left lung base with small left pleural effusion. This could be edema or pneumonia. Fine nodular changes in the lungs are better seen on prior CT. No pneumothorax. Degenerative changes in the spine. IMPRESSION: Cardiac  enlargement. Small left pleural effusion with basilar atelectasis or infiltration. Known pulmonary nodules are better seen on prior CT. Electronically Signed   By: Boyce Byes M.D.   On: 06/06/2023 00:42   MR Lumbar Spine W Wo Contrast Result Date: 06/05/2023 CLINICAL DATA:  Initial evaluation for acute myelopathy. History of metastatic non-small cell lung cancer. EXAM: MRI LUMBAR SPINE WITHOUT AND WITH CONTRAST TECHNIQUE: Multiplanar and multiecho pulse sequences of the lumbar spine were obtained without and with intravenous contrast. CONTRAST:  10mL GADAVIST  GADOBUTROL  1 MMOL/ML IV SOLN COMPARISON:  Comparison made with prior CT from 05/22/2023 and PET-CT from 03/15/2023 FINDINGS: Segmentation: Standard. Lowest well-formed disc space labeled the L5-S1 level. Alignment: 3 mm degenerative retrolisthesis of L5 on S1, with trace 2 mm anterolisthesis of L4 on L5. Findings chronic and facet mediated. Alignment otherwise normal preservation of the normal lumbar lordosis. Vertebrae: Vertebral  body height maintained without acute or chronic fracture. Bone marrow signal intensity within normal limits. 7 mm lesion present within the S1 segment, with additional 8 mm lesion within the left sacral ala (series 11, images 9, 14). These lesions demonstrate hypointense precontrast T1 signal intensity, STIR hyperintensity, with postcontrast enhancement. Given history of metastatic non-small cell lung cancer, findings are concerning for small osseous metastases. No other discrete or worrisome osseous lesions. No extra osseous or epidural tumor. Conus medullaris and cauda equina: Conus extends to the L2 level. Conus and cauda equina appear normal. No abnormal enhancement. Paraspinal and other soft tissues: Paraspinous soft tissues demonstrate no acute finding. Innumerable hepatic metastases noted within the visualized liver. Disc levels: L1-2: Normal interspace. Mild bilateral facet hypertrophy. No spinal stenosis. Foramina  remain patent. L2-3: Disc desiccation with diffuse disc bulge. Superimposed right foraminal to extraforaminal disc protrusion contacts the exiting right L2 nerve root (series 9, image 17). Moderate right with mild-to-moderate left facet hypertrophy. Resultant mild canal with moderate right lateral recess stenosis. Mild to moderate right L2 foraminal narrowing. Left neural foramina remains patent. L3-4: Disc desiccation without significant disc bulge. Moderate facet and ligament flavum hypertrophy. No significant spinal stenosis. Foramina remain patent. L4-5: Disc desiccation without significant disc bulge. Moderate to severe bilateral facet arthrosis. Resultant mild canal with bilateral subarticular stenosis. Mild to moderate bilateral L4 foraminal stenosis. L5-S1: Degenerative vertebral disc space narrowing with disc desiccation and diffuse disc bulge. Reactive endplate spurring. Small central annular fissure. Mild facet hypertrophy. No significant spinal stenosis. Moderate bilateral L5 foraminal narrowing. IMPRESSION: 1. No acute abnormality within the lumbar spine. No high-grade spinal stenosis or evidence for cord compression. 2. 7 mm lesion within the S1 segment, with additional 8 mm lesion within the left sacral ala. Given history of metastatic non-small cell lung cancer, findings are concerning for small osseous metastases. No extra osseous or epidural tumor. 3. Right foraminal to extraforaminal disc protrusion at L2-3, potentially affecting the exiting right L2 nerve root. 4. Moderate to advanced lower lumbar facet hypertrophy, which could contribute to lower back pain. 5. Innumerable hepatic metastases within the visualized liver. Electronically Signed   By: Virgia Griffins M.D.   On: 06/05/2023 22:43   MR Brain W Wo Contrast Result Date: 05/29/2023 CLINICAL DATA:  Metastatic disease evaluation, increased dizziness. EXAM: MRI HEAD WITHOUT AND WITH CONTRAST TECHNIQUE: Multiplanar, multiecho pulse  sequences of the brain and surrounding structures were obtained without and with intravenous contrast. CONTRAST:  10mL GADAVIST  GADOBUTROL  1 MMOL/ML IV SOLN COMPARISON:  10/07/2022 FINDINGS: Brain: No abnormal enhancement. No acute infarction, hemorrhage, hydrocephalus, extra-axial collection or mass lesion. Vascular: Normal flow voids. Skull and upper cervical spine: Normal marrow signal. Sinuses/Orbits: Negative. IMPRESSION: Negative for metastatic disease to the brain. Electronically Signed   By: Ronnette Coke M.D.   On: 05/29/2023 12:37   CT CHEST ABDOMEN PELVIS WO CONTRAST Result Date: 05/24/2023 CLINICAL DATA:  Non-small-cell lung cancer. Assess treatment response. * Tracking Code: BO * EXAM: CT CHEST, ABDOMEN AND PELVIS WITHOUT CONTRAST TECHNIQUE: Multidetector CT imaging of the chest, abdomen and pelvis was performed following the standard protocol without IV contrast. RADIATION DOSE REDUCTION: This exam was performed according to the departmental dose-optimization program which includes automated exposure control, adjustment of the mA and/or kV according to patient size and/or use of iterative reconstruction technique. COMPARISON:  PET-CT scan 03/15/2023.  Standard CT 02/06/2023. FINDINGS: CT CHEST FINDINGS Cardiovascular: Evaluation significantly limited without the advantage of IV contrast. Grossly the heart is nonenlarged. No  significant pericardial effusion. Coronary artery calcifications are seen. Please correlate for other coronary risk factors. The thoracic aorta has normal course and caliber with some atherosclerotic plaque. Mediastinum/Nodes: Normal caliber thoracic esophagus. Small thyroid  gland. No specific abnormal lymph node enlargement identified in the axillary regions, right hilum. Evaluation is limited without IV contrast. There are some mediastinal nodes identified once again. Example previously measured subcarinal at 18 mm and today short axis of 15 mm on series 2, image 21. Left  hilar node which previously had measured 16 mm, today more difficult to measure without IV contrast but estimated at 17 mm. Series 2, image 21. No new mediastinal nodal enlargement. Lungs/Pleura: Increased small left pleural effusion. No right-sided effusion. No pneumothorax. There is fluid tracking along the left interlobar fissure as well. Numerous bilateral lung nodules are identified, diffusely distributed. The distribution appears similar to previous examination. Many of the smaller lesions appear similar in size. Specific lesions be followed for continuity. This includes the dominant focus medial at the left lung base which previously had AP dimension of 4.1 cm and today 4.1 cm on series 4, image 73. The right lower lobe central lesion which measured 12 mm on the previous examination, today on series 4, image 54 measures 12 mm. Musculoskeletal: Scattered degenerative changes of the spine. Some curvature. CT ABDOMEN PELVIS FINDINGS Hepatobiliary: Extensive liver metastases are once again identified. Again evaluation lesions is limited without the advantage of IV contrast. Specifically which will be attempted to be remeasured today for continuity. Lesion seen left lobe lateral segment measuring 2.5 cm on the previous, today is estimated at 2.4 cm on series 2, image 41. Lesion seen in segment 4 previously measuring 4.6 cm, today is estimated at 4.9 cm on series 2, image 50. Overall liver lesions appear relatively similar in extent distribution when adjusted for technique. Subtle changes are difficult to define with the differences in technique liver has a nodular contour. Pancreas: Unremarkable. No pancreatic ductal dilatation or surrounding inflammatory changes. Spleen: Normal in size without focal abnormality. Adrenals/Urinary Tract: Stable adrenal nodularity. No obstructing renal or ureteral stones. Nonspecific perinephric stranding. No collecting system dilatation. Preserved contour to the urinary bladder.  Stomach/Bowel: On this non oral contrast exam large bowel has a normal course and caliber. Scattered colonic diverticula and stool. No small bowel dilatation. Normal appendix in the right lower quadrant. Vascular/Lymphatic: Aortic atherosclerosis. No enlarged abdominal or pelvic lymph nodes. Reproductive: Uterus is present. There is slight enlargement of the right ovary compared to left, nonspecific. Unchanged from previous. Other: Mild ascites identified today comment new from previous. This includes along the margin of the liver and in the dependent pelvis. Increasing mesenteric stranding Musculoskeletal: Moderate degenerative changes of the spine and pelvis. IMPRESSION: Evaluation is limited without IV contrast. Specific lesions are difficult to measure and directly compared to the previous contrast examinations. 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 There is also stable mediastinal and left hilar nodes overall. Difficulty specifically true liver lesions. If needed additional workup may be of some benefit of the liver including potentially MRI if CT is not an option with contrast However increasing small left pleural effusion. Developing mild ascites. Please correlate for any clinical evidence of pseudo cirrhosis with nodular contour to the liver as well. Electronically Signed   By: Adrianna Horde M.D.   On: 05/24/2023 16:25   MM 3D SCREENING MAMMOGRAM BILATERAL BREAST Result Date: 05/16/2023 CLINICAL DATA:  Screening. EXAM: DIGITAL SCREENING BILATERAL MAMMOGRAM WITH  TOMOSYNTHESIS AND CAD TECHNIQUE: Bilateral screening digital craniocaudal and mediolateral oblique mammograms were obtained. Bilateral screening digital breast tomosynthesis was performed. The images were evaluated with computer-aided detection. COMPARISON:  Previous exam(s). ACR Breast Density Category b: There are scattered areas of fibroglandular density. FINDINGS: There are no findings  suspicious for malignancy. IMPRESSION: No mammographic evidence of malignancy. A result letter of this screening mammogram will be mailed directly to the patient. RECOMMENDATION: Screening mammogram in one year. (Code:SM-B-01Y) BI-RADS CATEGORY  1: Negative. Electronically Signed   By: Rinda Cheers M.D.   On: 05/16/2023 15:16

## 2023-06-11 NOTE — Progress Notes (Signed)
 Physical Therapy Treatment Patient Details Name: Madison Palmer MRN: [id number] DOB: [birth year] Today's Date: 06/11/2023   History of Present Illness Madison Palmer is a 58 y.o. female presents to ED 2//25  with progressive Lower extremity weakness, SOB,  CT angiogram of the chest was negative for PE but did show a moderate-sized left pleural effusion with  also evidence of metastatic disease. Labs show mild hypokalemia macrocytic anemia and neutropenia.  VOZ:DGUYQ IV lung cancer on chemotherapy and Keytruda  last dose was on 05/29/2023 with history of diastolic CHF, hypertension, hyperlipidemia, sleep apnea, depression, hypothyroidism, morbid obesity    PT Comments  General Comments: AxO x 3 Biology Professor, pleasant, increased anxiety with activity.  Mostly quiet.  Feeling "bad" and fear of the unknown.  "What's going to happen to me?". Assisted OOB to amb in hallway required increased time and one rest break.  Returned to room then assist on/off BSC.  Then required another rest break. Pt lives home with Son and was Indep/driving/working prior to admit.     If plan is discharge home, recommend the following: A little help with walking and/or transfers;Assistance with cooking/housework;Assist for transportation;Help with stairs or ramp for entrance;A little help with bathing/dressing/bathroom   Can travel by private vehicle        Equipment Recommendations  Rolling walker (2 wheels)    Recommendations for Other Services       Precautions / Restrictions Precautions Precautions: Fall Precaution Comments: monitor sats Restrictions Weight Bearing Restrictions Per Provider Order: No     Mobility  Bed Mobility Overal bed mobility: Needs Assistance Bed Mobility: Supine to Sit           General bed mobility comments: pt required Mod assist for upper body then increased time to scoot to EOB.    Transfers Overall transfer level: Needs assistance Equipment used: Rolling walker (2  wheels), None Transfers: Sit to/from Stand Sit to Stand: Min assist, Mod assist   Step pivot transfers: Mod assist, Min assist       General transfer comment: assisted OOB to walker with increased time and effort to rise.  Also assisted with a BSC transfer.  Pt was able to partially rise, support self using B UE's and complete 1/4 pivot w/o AD.    Ambulation/Gait Ambulation/Gait assistance: Min assist, Mod assist Gait Distance (Feet): 12 Feet Assistive device: Rolling walker (2 wheels) Gait Pattern/deviations: Step-to pattern, Trunk flexed, Wide base of support, Shuffle Gait velocity: decreased     General Gait Details: assisted with amb an increased distance of 4 feet then another 6 feet with walker and recliner following.  Distance limited by fatigue/weakness.   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  Mostly quiet.  Feeling "bad" and fear of the unknown.  "  What's going to happen to me?".        Exercises      General Comments        Pertinent Vitals/Pain Pain Assessment Pain Assessment: No/denies pain    Home Living                          Prior Function            PT Goals (current goals can now be found in the care plan section) Progress towards PT goals: Progressing toward goals    Frequency    Min 1X/week      PT Plan      Co-evaluation              AM-PAC PT "6 Clicks" Mobility   Outcome Measure  Help needed turning from your back to your side while in a flat bed without using bedrails?: A Lot Help needed moving from lying on your back to sitting on the side of a flat bed  without using bedrails?: A Lot Help needed moving to and from a bed to a chair (including a wheelchair)?: A Lot Help needed standing up from a chair using your arms (e.g., wheelchair or bedside chair)?: A Lot Help needed to walk in hospital room?: A Lot Help needed climbing 3-5 steps with a railing? : Total 6 Click Score: 11    End of Session Equipment Utilized During Treatment: Gait belt Activity Tolerance: Patient limited by fatigue Patient left: in chair;with call bell/phone within reach Nurse Communication: Mobility status PT Visit Diagnosis: Unsteadiness on feet (R26.81);Muscle weakness (generalized) (M62.81);Difficulty in walking, not elsewhere classified (R26.2);Pain Pain - Right/Left: Left     Time: 1610-9604 PT Time Calculation (min) (ACUTE ONLY): 29 min  Charges:    $Gait Training: 8-22 mins $Therapeutic Activity: 8-22 mins PT General Charges $$ ACUTE PT VISIT: 1 Visit                     Bess Broody  PTA Acute  Rehabilitation Services Office M-F          [phone]

## 2023-06-11 NOTE — Progress Notes (Signed)
 Inpatient Rehab Admissions Coordinator:   Unable to reach pt by room phone today, left message on cell phone to discuss rehab recommendations and pt preference rehab vs. home.    Loye Rumble, PT, DPT Admissions Coordinator [phone] 06/11/23  2:57 PM

## 2023-06-12 ENCOUNTER — Inpatient Hospital Stay: Payer: BC Managed Care – PPO | Attending: Physician Assistant

## 2023-06-12 DIAGNOSIS — R040 Epistaxis: Secondary | ICD-10-CM | POA: Insufficient documentation

## 2023-06-12 DIAGNOSIS — R531 Weakness: Secondary | ICD-10-CM | POA: Diagnosis not present

## 2023-06-12 DIAGNOSIS — Z9981 Dependence on supplemental oxygen: Secondary | ICD-10-CM | POA: Insufficient documentation

## 2023-06-12 DIAGNOSIS — Z5112 Encounter for antineoplastic immunotherapy: Secondary | ICD-10-CM | POA: Insufficient documentation

## 2023-06-12 DIAGNOSIS — Z79620 Long term (current) use of immunosuppressive biologic: Secondary | ICD-10-CM | POA: Insufficient documentation

## 2023-06-12 DIAGNOSIS — C3492 Malignant neoplasm of unspecified part of left bronchus or lung: Secondary | ICD-10-CM | POA: Insufficient documentation

## 2023-06-12 DIAGNOSIS — F419 Anxiety disorder, unspecified: Secondary | ICD-10-CM | POA: Insufficient documentation

## 2023-06-12 DIAGNOSIS — R410 Disorientation, unspecified: Secondary | ICD-10-CM | POA: Insufficient documentation

## 2023-06-12 DIAGNOSIS — R7989 Other specified abnormal findings of blood chemistry: Secondary | ICD-10-CM | POA: Insufficient documentation

## 2023-06-12 DIAGNOSIS — C787 Secondary malignant neoplasm of liver and intrahepatic bile duct: Secondary | ICD-10-CM | POA: Insufficient documentation

## 2023-06-12 DIAGNOSIS — R5383 Other fatigue: Secondary | ICD-10-CM | POA: Insufficient documentation

## 2023-06-12 DIAGNOSIS — F319 Bipolar disorder, unspecified: Secondary | ICD-10-CM | POA: Insufficient documentation

## 2023-06-12 DIAGNOSIS — J90 Pleural effusion, not elsewhere classified: Secondary | ICD-10-CM | POA: Insufficient documentation

## 2023-06-12 LAB — BODY FLUID CULTURE W GRAM STAIN
Culture: NO GROWTH
Gram Stain: NONE SEEN

## 2023-06-12 LAB — PREPARE PLATELET PHERESIS
Unit division: 0
Unit division: 0

## 2023-06-12 LAB — BPAM PLATELET PHERESIS
Blood Product Expiration Date: 202502112359
Blood Product Expiration Date: 202502112359
ISSUE DATE / TIME: 202502101922
ISSUE DATE / TIME: 202502102058
Unit Type and Rh: 6200
Unit Type and Rh: 6200

## 2023-06-12 LAB — CULTURE, BLOOD (ROUTINE X 2)
Culture: NO GROWTH
Culture: NO GROWTH

## 2023-06-12 LAB — CBC
HCT: 24.6 % — ABNORMAL LOW (ref 36.0–46.0)
Hemoglobin: 7.7 g/dL — ABNORMAL LOW (ref 12.0–15.0)
MCH: 34.5 pg — ABNORMAL HIGH (ref 26.0–34.0)
MCHC: 31.3 g/dL (ref 30.0–36.0)
MCV: 110.3 fL — ABNORMAL HIGH (ref 80.0–100.0)
Platelets: 32 10*3/uL — ABNORMAL LOW (ref 150–400)
RBC: 2.23 MIL/uL — ABNORMAL LOW (ref 3.87–5.11)
RDW: 16.3 % — ABNORMAL HIGH (ref 11.5–15.5)
WBC: 6 10*3/uL (ref 4.0–10.5)
nRBC: 0 % (ref 0.0–0.2)

## 2023-06-12 LAB — BASIC METABOLIC PANEL
Anion gap: 6 (ref 5–15)
BUN: 37 mg/dL — ABNORMAL HIGH (ref 6–20)
CO2: 36 mmol/L — ABNORMAL HIGH (ref 22–32)
Calcium: 10.3 mg/dL (ref 8.9–10.3)
Chloride: 99 mmol/L (ref 98–111)
Creatinine, Ser: 1.51 mg/dL — ABNORMAL HIGH (ref 0.44–1.00)
GFR, Estimated: 40 mL/min — ABNORMAL LOW (ref >60)
Glucose, Bld: 108 mg/dL — ABNORMAL HIGH (ref 70–99)
Potassium: 4.3 mmol/L (ref 3.5–5.1)
Sodium: 141 mmol/L (ref 135–145)

## 2023-06-12 LAB — AMMONIA: Ammonia: 38 umol/L — ABNORMAL HIGH (ref 9–35)

## 2023-06-12 NOTE — Progress Notes (Signed)
Inpatient Rehabilitation Admissions Coordinator   Noted patient does not have assist at home. As patient will need assistance at home due to her dx, physical as well as cognition needs, we will not pursue CIR. We will sign off.  Ottie Glazier, RN, MSN Rehab Admissions Coordinator [phone] 06/12/2023 2:59 PM

## 2023-06-12 NOTE — [place] FL2 (Signed)
[place] MEDICAID FL2 LEVEL OF CARE FORM     IDENTIFICATION  Patient Name: Madison Palmer Birthdate: 09/30/1965 Sex: female Admission Date (Current Location): 06/05/2023  [facility] and [place] Number:  Producer, television/film/video and Address:  [facility],  [address], [place] [address]      Provider Number: [id number]  Attending Physician Name and Address:  Alba Cory, MD  Relative Name and Phone Number:  Celene Skeen  [phone]    Current Level of Care: Hospital Recommended Level of Care: Skilled Nursing Facility Prior Approval Number:    Date Approved/Denied:   PASRR Number: Pending  Discharge Plan: SNF    Current Diagnoses: Patient Active Problem List   Diagnosis Date Noted   Lower extremity weakness 06/06/2023   Neutropenia (HCC) 06/06/2023   Neuropathy 06/06/2023   Macrocytic anemia 06/06/2023   Thrombocytopenia (HCC) 06/06/2023   Weakness 06/05/2023   Dermatitis 04/03/2023   Encounter for antineoplastic chemotherapy 03/27/2023   Cellulitis of right lower extremity 02/01/2023   Urinary hesitancy 02/01/2023   Conductive hearing loss in left ear 02/01/2023   Impacted cerumen of left ear 02/01/2023   Other specified disorders of eustachian tube, bilateral 02/01/2023   Neutropenia, drug-induced (HCC) 12/19/2022   Encounter for antineoplastic immunotherapy 10/10/2022   Metastatic lung cancer (metastasis from lung to other site) (HCC) 09/28/2022   Goals of care, counseling/discussion 09/28/2022   Adenocarcinoma of left lung, stage 4 (HCC) 09/28/2022   Lesion of liver 08/28/2022   Fecal urgency 08/19/2022   Change in bowel habits 08/19/2022   Hearing loss 10/28/2021   Pyrosis 06/22/2021   Hx of adenomatous colonic polyps 06/22/2021   Cervical disc disorder with radiculopathy of cervical region 11/17/2020   Schizoaffective disorder, bipolar type (HCC) 10/07/2020   DM (diabetes mellitus) (HCC) 10/07/2020   Somatic dysfunction of spine,  cervical 10/06/2020   Polyphagia 09/07/2020   Restrictive lung disease 08/11/2020   Elevated coronary artery calcium score 01/14/2019   Class 3 severe obesity with serious comorbidity and body mass index (BMI) of 50.0 to 59.9 in adult (HCC) 09/05/2018   Adenomatous polyp 08/18/2018   Chronic diastolic HF (heart failure) (HCC) 05/15/2018   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   DDD (degenerative disc disease), lumbar 03/21/2018   Primary osteoarthritis of both knees 03/21/2018   Dysphagia 03/19/2018   PVC (premature ventricular contraction) 03/12/2018   LVH (left ventricular hypertrophy) 03/12/2018   Bilateral carotid artery stenosis 03/12/2018   Hemorrhoids 01/05/2018   Hiatal hernia 01/05/2018   Esophageal dysmotilities 01/05/2018   Hyperlipidemia associated with type 2 diabetes mellitus (HCC) 09/12/2017   Hyperparathyroidism (HCC) 06/13/2017   Vitamin D deficiency 06/13/2017   History of colonic polyps 05/15/2017   Eustachian tube dysfunction, right 05/15/2017   Lymphatic edema 05/15/2017   Anemia 05/11/2017   Arrhythmia 05/11/2017   Diverticulosis 05/11/2017   GERD (gastroesophageal reflux disease) 05/11/2017   Hypothyroidism 05/11/2017   PCOS (polycystic ovarian syndrome) 05/11/2017   Obstructive sleep apnea 05/11/2017   Neck pain 05/11/2017   Allergic rhinitis 05/11/2017   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/2012   Essential hypertension 05/01/1990    Orientation RESPIRATION BLADDER Height & Weight     Self  O2 (2L) Incontinent Weight: 283 lb 8.2 oz (128.6 kg) Height:  5\' 2"  (157.5 cm)  BEHAVIORAL SYMPTOMS/MOOD NEUROLOGICAL BOWEL NUTRITION STATUS      Continent Diet (Heart healthy)  AMBULATORY STATUS COMMUNICATION OF NEEDS Skin   Limited Assist Verbally Other (Comment) (  Irritant Dermatitis)                       Personal Care Assistance Level of Assistance  Bathing, Feeding, Dressing Bathing Assistance:  Limited assistance Feeding assistance: Limited assistance Dressing Assistance: Limited assistance     Functional Limitations Info  Hearing, Speech, Sight Sight Info: Impaired Hearing Info: Adequate Speech Info: Adequate    SPECIAL CARE FACTORS FREQUENCY  PT (By licensed PT), OT (By licensed OT)     PT Frequency: 5x/wk OT Frequency: 5x/wk            Contractures Contractures Info: Not present    Additional Factors Info  Code Status, Psychotropic, Allergies Code Status Info: FULL Allergies Info: Other, Fentanyl, Levofloxacin, Midazolam, Pollen Extract, Atorvastatin, Hydralazine Hcl, Rosuvastatin Psychotropic Info: See MAR         Current Medications (06/12/2023):  This is the current hospital active medication list Current Facility-Administered Medications  Medication Dose Route Frequency Provider Last Rate Last Admin   acetaminophen (TYLENOL) tablet 650 mg  650 mg Oral Q6H PRN Eduard Clos, MD   650 mg at 06/12/23 1235   Or   acetaminophen (TYLENOL) suppository 650 mg  650 mg Rectal Q6H PRN Eduard Clos, MD       amLODipine (NORVASC) tablet 10 mg  10 mg Oral Daily Eduard Clos, MD   10 mg at 06/12/23 1020   atorvastatin (LIPITOR) tablet 20 mg  20 mg Oral Q M,W,F Eduard Clos, MD   20 mg at 06/11/23 1002   benztropine (COGENTIN) tablet 0.5 mg  0.5 mg Oral QHS Eduard Clos, MD   0.5 mg at 06/11/23 2117   cholecalciferol (VITAMIN D3) 25 MCG (1000 UNIT) tablet 5,000 Units  5,000 Units Oral Daily Eduard Clos, MD   5,000 Units at 06/12/23 1020   doxycycline (VIBRA-TABS) tablet 100 mg  100 mg Oral Q12H Regalado, Belkys A, MD   100 mg at 06/12/23 1018   fenofibrate tablet 160 mg  160 mg Oral Daily Eduard Clos, MD   160 mg at 06/12/23 1020   folic acid (FOLVITE) tablet 2 mg  2 mg Oral Daily Regalado, Belkys A, MD   2 mg at 06/12/23 1019   gabapentin (NEURONTIN) capsule 200 mg  200 mg Oral QHS Eduard Clos, MD   200  mg at 06/11/23 2117   hydrOXYzine (ATARAX) tablet 25 mg  25 mg Oral TID PRN Regalado, Belkys A, MD   25 mg at 06/11/23 2121   labetalol (NORMODYNE) tablet 450 mg  450 mg Oral BID Eduard Clos, MD   450 mg at 06/12/23 1018   levothyroxine (SYNTHROID) tablet 88 mcg  88 mcg Oral Q0600 Eduard Clos, MD   88 mcg at 06/12/23 0523   lurasidone (LATUDA) tablet 40 mg  40 mg Oral Q breakfast Eduard Clos, MD   40 mg at 06/12/23 1020   nystatin (MYCOSTATIN/NYSTOP) topical powder   Topical TID Regalado, Belkys A, MD   Given at 06/12/23 1023   pantoprazole (PROTONIX) EC tablet 40 mg  40 mg Oral Daily Eduard Clos, MD   40 mg at 06/12/23 1018   polyethylene glycol (MIRALAX / GLYCOLAX) packet 17 g  17 g Oral BID Regalado, Belkys A, MD   17 g at 06/12/23 1017     Discharge Medications: Please see discharge summary for a list of discharge medications.  Relevant Imaging Results:  Relevant Lab Results:  Additional Information SSN: [id number]  Otelia Santee, LCSW

## 2023-06-12 NOTE — Progress Notes (Signed)
Inpatient Rehabilitation Admissions Coordinator   Attempted to speak with patient by phone. She is eating lunch . I will call back.  Ottie Glazier, RN, MSN Rehab Admissions Coordinator [phone] 06/12/2023 11:46 AM

## 2023-06-12 NOTE — Progress Notes (Signed)
   06/12/23 0024  BiPAP/CPAP/SIPAP  BiPAP/CPAP/SIPAP Pt Type Adult  BiPAP/CPAP/SIPAP Resmed  Mask Type Nasal mask  Dentures removed? Not applicable  Mask Size Medium  EPAP 9 cmH2O  Flow Rate 3 lpm  Patient Home Equipment No  Auto Titrate No

## 2023-06-12 NOTE — Progress Notes (Signed)
Occupational Therapy Treatment Patient Details Name: Madison Palmer MRN: [id number] DOB: 26-Mar-1966 Today's Date: 06/12/2023   History of present illness Madison Palmer is a 58 y.o. female presents to ED 2//25  with progressive Lower extremity weakness, SOB,  CT angiogram of the chest was negative for PE but did show a moderate-sized left pleural effusion with  also evidence of metastatic disease. Labs show mild hypokalemia macrocytic anemia and neutropenia.  YQM:VHQIO IV lung cancer on chemotherapy and Keytruda last dose was on 05/29/2023 with history of diastolic CHF, hypertension, hyperlipidemia, sleep apnea, depression, hypothyroidism, morbid obesity   OT comments  Pt agreed to complete activity at EOB. She was able to complete bed mobility with min assist for supine to assist and then completed oral care and face washing with set up with good balance. She then stood once but then required to go back into sitting position. She then went back into supine with min assist. She was very thankful for working with therapy but just reported feeling very anxious. Patient will benefit from intensive inpatient follow-up therapy, >3 hours/day.      If plan is discharge home, recommend the following:  A lot of help with walking and/or transfers;A lot of help with bathing/dressing/bathroom;Assistance with cooking/housework;Help with stairs or ramp for entrance   Equipment Recommendations  None recommended by OT    Recommendations for Other Services Rehab consult    Precautions / Restrictions Precautions Precautions: Fall Precaution/Restrictions Comments: monitor sats Restrictions Weight Bearing Restrictions Per Provider Order: No       Mobility Bed Mobility Overal bed mobility: Needs Assistance Bed Mobility: Supine to Sit, Sit to Supine     Supine to sit: Min assist Sit to supine: Min assist   General bed mobility comments: Pt noted to need assist to get started with tasks     Transfers Overall transfer level: Modified independent Equipment used: Rolling walker (2 wheels) Transfers: Sit to/from Stand Sit to Stand: Contact guard assist                 Balance Overall balance assessment: Needs assistance Sitting-balance support: Feet supported Sitting balance-Leahy Scale: Fair Sitting balance - Comments: Pt completed all grooming tasks at EOB   Standing balance support: Bilateral upper extremity supported Standing balance-Leahy Scale: Poor Standing balance comment: only stood up and then reported they must sit back down                           ADL either performed or assessed with clinical judgement   ADL Overall ADL's : Needs assistance/impaired Eating/Feeding: Set up;Sitting   Grooming: Wash/dry hands;Wash/dry face;Oral care;Set up;Sitting   Upper Body Bathing: Minimal assistance;Sitting       Upper Body Dressing : Minimal assistance;Sitting   Lower Body Dressing: Moderate assistance;Cueing for safety;Cueing for sequencing;Bed level Lower Body Dressing Details (indicate cue type and reason): long sitting reaching down to socks in bed level                    Extremity/Trunk Assessment Upper Extremity Assessment Upper Extremity Assessment: Overall WFL for tasks assessed;Left hand dominant LUE Deficits / Details: Pt noted would shake L pointer finger   Lower Extremity Assessment Lower Extremity Assessment: Defer to PT evaluation        Vision       Perception     Praxis     Communication Communication Communication: No apparent difficulties   Cognition Arousal:  Alert Behavior During Therapy: Flat affect, Anxious Cognition: Difficult to assess             OT - Cognition Comments: Pt noted to report feeling very anxious but could not report about what exactly. Pt noted have gospel music playing calmed them a little bit in session.                          Cueing   Cueing Techniques:  Verbal cues  Exercises      Shoulder Instructions       General Comments      Pertinent Vitals/ Pain       Pain Assessment Pain Assessment: No/denies pain  Home Living                                          Prior Functioning/Environment              Frequency  Min 1X/week        Progress Toward Goals  OT Goals(current goals can now be found in the care plan section)  Progress towards OT goals: Progressing toward goals  Acute Rehab OT Goals Patient Stated Goal: none reported OT Goal Formulation: With patient Time For Goal Achievement: 06/21/23 Potential to Achieve Goals: Good ADL Goals Pt Will Perform Eating: with modified independence;sitting;with adaptive utensils Pt Will Perform Grooming: with modified independence;sitting Pt Will Perform Upper Body Dressing: with modified independence;sitting Pt Will Perform Lower Body Dressing: with supervision;sit to/from stand Pt Will Transfer to Toilet: with modified independence;ambulating Pt Will Perform Toileting - Clothing Manipulation and hygiene: with modified independence;sit to/from stand Additional ADL Goal #1: Pt will verbalize at least 3 strategies for energy conservation during ADL with no cues  Plan      Co-evaluation                 AM-PAC OT "6 Clicks" Daily Activity     Outcome Measure   Help from another person eating meals?: None Help from another person taking care of personal grooming?: A Little Help from another person toileting, which includes using toliet, bedpan, or urinal?: A Lot Help from another person bathing (including washing, rinsing, drying)?: A Lot Help from another person to put on and taking off regular upper body clothing?: A Little Help from another person to put on and taking off regular lower body clothing?: A Lot 6 Click Score: 16    End of Session Equipment Utilized During Treatment: Gait belt;Rolling walker (2 wheels)  OT Visit Diagnosis:  Unsteadiness on feet (R26.81);Muscle weakness (generalized) (M62.81)   Activity Tolerance Patient limited by fatigue   Patient Left in bed;with call bell/phone within reach;with bed alarm set   Nurse Communication Mobility status        Time: 1610-9604 OT Time Calculation (min): 28 min  Charges: OT General Charges $OT Visit: 1 Visit OT Treatments $Self Care/Home Management : 23-37 mins  Presley Raddle OTR/L  Acute Rehab Services  [phone] office number   Alphia Moh 06/12/2023, 11:46 AM

## 2023-06-12 NOTE — Progress Notes (Addendum)
PROGRESS NOTE    Madison Palmer  KVQ:[id number] DOB: [birth year] DOA: 06/05/2023 PCP: Anne Ng, NP   Brief Narrative: 58 year old with past medical history significant for stage IV lung cancer on chemotherapy, and Keytruda last dose was 05/29/2023, history of diastolic heart failure, hypertension, hyperlipidemia, sleep apnea, depression, hypothyroidism, who presented with progressive lower extremity weakness over the last 2 weeks.  Patient states that her weakness was initially on the right side and in the last few days presents on both lower extremities.  Patient recently had follow-up with oncologist Dr. Shirline Frees on 1/28 when MRI of the brain was planned.   MRI of the brain 1/21st was negative for acute finding.   Patient bilateral lower extremity thought to be secondary to deconditioning.  MRI of the lumbar spine was reviewed by neurosurgery who does not recommend any surgical intervention at this point.  Patient was found to have recurrence of left pleural effusion and underwent thoracentesis.  Also found to be neutropenic and thrombocytopenic required Granix  And platelet transfusion.  Patient may be able to transfer to SNF  by tomorrow if platelet counts remain stable.  Assessment & Plan:   Principal Problem:   Lower extremity weakness Active Problems:   Hypothyroidism   Obstructive sleep apnea   Essential hypertension   Chronic diastolic HF (heart failure) (HCC)   Class 3 severe obesity with serious comorbidity and body mass index (BMI) of 50.0 to 59.9 in adult [facility])   Adenocarcinoma of left lung, stage 4 (HCC)   Weakness   Neutropenia (HCC)   Neuropathy   Macrocytic anemia   Thrombocytopenia (HCC)   1-Bilateral lower extremity weakness:  -Admitted discussed case with on-call neurology, who thought her symptoms were more related to deconditioning. -MRI brain: no acute finding.  -MR Lumbar:  7 mm lesion within the S1 segment, with additional 8 mm lesion within the  left sacral ala. Given history of metastatic non-small cell lung cancer, findings are concerning for small osseous metastases. No extra osseous or epidural tumor. Right foraminal to extraforaminal disc protrusion at L2-3, potentially affecting the exiting right L2 nerve root. Moderate to advanced lower lumbar facet hypertrophy, which could contribute to lower back pain.  -Weakness improving.  -Discussed MRI spine with Dr Alphonzo Lemmings, no surgical intervention needed, Out patient follow up.  Needs PT She agrees to go to CIR>   2-Neutropenia, History of stage IV Lung cancer;  -Neutropenia in the setting of recent chemotherapy Pancytopenia, in setting chemotherapy.  Cussed with Dr. Shirline Frees plan to continue with Granix on the ANC more than 1500 Improving. WBC increase to 4 She has received a total of 4 units of platelets. Platelet  count today increased to 34.  3-Left pleural effusion concern with malignancy versus loculated -Discussed with Dr. Shirline Frees we can proceed with thoracentesis when white blood cell counts improved -Continue  IV antibiotics to cover for infectious process -IV cefepime . Vanc change to Doxy due to renal insufficiency.  -2/6: Worsening SOB in the  afternoon, oxygen requirement increase to 4 L.  -Chest x ray small effusion. CCM consulted recommend thoracentesis by IR>  -IR consulted for thoracentesis. -Underwent Thoracentesis 400 cc of fluid removed. Follow culture. No growth to date. Cytology pending.  Plan to complete 7 days antibiotics.   4-Panniculitis, pubic area with redness and sluggish skin.  Concern with superimposed bacterial cellulitis -Treated with  IV antibiotics -Nystatin powder  Acute Metabolic Encephalopathy:  She  has been anxious at times, Confuse per nurse.  2/06: MRI No acute intracranial abnormality identified. Will stop Cefepime to see if helps with confusion.   Hypertension: Continue with labetalol,  amlodipine Hold lisinopril due to increase  cr  Increase Cr; monitor.  Encourage to drink fluids.  Holding Lisinopril.  Previous Cr 2 weeks ago 1.4  Abnormal rhythm on monitor per nurse. Mg replaced.   Hyperlipidemia: Continue with the statins and fenofibrate  Lipid apnea, continue with CPAP  Normocytic  anemia: Follow anemia panel  thrombocytopenia in the setting of chemotherapy  Diastolic heart failure chronic compensated -resume lasix tomorrow  Depression: Continue Latuda and Cogentin Anxiety, agitation last night; had haldol. Atarax PRN  Neuropathy: Continue with gabapentin Mild hypokalemia: Replaced  Severe Obesity ; needs life style modifications.   Estimated body mass index is 51.85 kg/m as calculated from the following:   Height as of this encounter: 5\' 2"  (1.575 m).   Weight as of this encounter: 128.6 kg.   DVT prophylaxis: SCDs Code Status: Full code Family Communication: Care discussed with patient Disposition Plan:  Status is: Observation The patient will require care spanning > 2 midnights and should be moved to inpatient because: Management of neutropenia, weakness pleural effusion    Consultants:  Oncology   Procedures:  none  Antimicrobials:    Subjective: She gets anxious at times. Required Atarax.  Breathing ok.  No new complaints.    Objective: Vitals:   06/11/23 2050 06/11/23 2115 06/11/23 2200 06/12/23 0437  BP: 128/71 131/75 139/72 (!) 149/79  Pulse: 88 84 88 77  Resp: 18  18 20   Temp:  (!) 97.4 F (36.3 C) 98.8 F (37.1 C) 98.3 F (36.8 C)  TempSrc:  Oral Oral   SpO2:  99%  100%  Weight:      Height:        Intake/Output Summary (Last 24 hours) at 06/12/2023 1226 Last data filed at 06/12/2023 8657 Gross per 24 hour  Intake 975.62 ml  Output 1750 ml  Net -774.38 ml   Filed Weights   06/05/23 1621 06/06/23 1605  Weight: 127.6 kg 128.6 kg    Examination:  General exam: Morbid Obese Respiratory system: BL air movement.  Cardiovascular system: S 1, S 2   RRR Gastrointestinal system:Alert, follows command Extremities: She is able to move bilateral lower lower extremities   Data Reviewed: I have personally reviewed following labs and imaging studies  CBC: Recent Labs  Lab 06/06/23 0557 06/07/23 0953 06/08/23 0844 06/09/23 0711 06/10/23 0651 06/11/23 0839 06/12/23 0554  WBC 0.7* 1.4* 2.7* 3.9* 4.7 5.9 6.0  NEUTROABS 0.2* 1.0* 1.9  --  2.8 4.4  --   HGB 9.5* 9.2* 8.0* 8.1* 8.2* 8.6* 7.7*  HCT 29.8* 29.1* 25.7* 26.4* 26.4* 28.3* 24.6*  MCV 108.4* 109.4* 110.3* 110.0* 110.5* 110.1* 110.3*  PLT 83* 42* 29* 17* 11* 14* 32*   Basic Metabolic Panel: Recent Labs  Lab 06/06/23 0557 06/07/23 0953 06/07/23 1418 06/08/23 0844 06/09/23 0711 06/10/23 0651 06/12/23 0557  NA 140 141  --  140 143 145 141  K 3.7 3.8  --  3.9 4.2 4.3 4.3  CL 95* 99  --  98 101 103 99  CO2 34* 34*  --  33* 34* 35* 36*  GLUCOSE 122* 196*  --  119* 118* 118* 108*  BUN 21* 28*  --  33* 37* 34* 37*  CREATININE 1.02* 1.33*  --  1.58* 1.53* 1.50* 1.51*  CALCIUM 9.9 10.2  --  10.2 10.4* 10.7* 10.3  MG  2.0  --  1.8 2.1  --   --   --    GFR: Estimated Creatinine Clearance: 52.9 mL/min (A) (by C-G formula based on SCr of 1.51 mg/dL (H)). Liver Function Tests: Recent Labs  Lab 06/06/23 0557 06/07/23 0953  AST 39 26  ALT 33 26  ALKPHOS 60 58  BILITOT 2.1* 1.3*  PROT 6.5 6.1*  ALBUMIN 2.7* 2.4*   No results for input(s): "LIPASE", "AMYLASE" in the last 168 hours. No results for input(s): "AMMONIA" in the last 168 hours. Coagulation Profile: No results for input(s): "INR", "PROTIME" in the last 168 hours. Cardiac Enzymes: Recent Labs  Lab 06/06/23 0557  CKTOTAL 66   BNP (last 3 results) No results for input(s): "PROBNP" in the last 8760 hours. HbA1C: No results for input(s): "HGBA1C" in the last 72 hours. CBG: No results for input(s): "GLUCAP" in the last 168 hours. Lipid Profile: No results for input(s): "CHOL", "HDL", "LDLCALC", "TRIG",  "CHOLHDL", "LDLDIRECT" in the last 72 hours. Thyroid Function Tests: No results for input(s): "TSH", "T4TOTAL", "FREET4", "T3FREE", "THYROIDAB" in the last 72 hours.  Anemia Panel: No results for input(s): "VITAMINB12", "FOLATE", "FERRITIN", "TIBC", "IRON", "RETICCTPCT" in the last 72 hours.  Sepsis Labs: No results for input(s): "PROCALCITON", "LATICACIDVEN" in the last 168 hours.  Recent Results (from the past 240 hours)  Culture, blood (Routine X 2) w Reflex to ID Panel     Status: None   Collection Time: 06/06/23  8:55 AM   Specimen: BLOOD LEFT WRIST  Result Value Ref Range Status   Specimen Description   Final    BLOOD LEFT WRIST Performed at [facility], [address], [place], [place] [address]    Special Requests   Final    BOTTLES DRAWN AEROBIC AND ANAEROBIC Blood Culture results may not be optimal due to an inadequate volume of blood received in culture bottles Performed at [facility], [address], [place], [place] [address]    Culture   Final    NO GROWTH 6 DAYS Performed at [facility], [address], [place], [place] [address]    Report Status 06/12/2023 FINAL  Final  Culture, blood (Routine X 2) w Reflex to ID Panel     Status: None   Collection Time: 06/06/23  8:58 AM   Specimen: BLOOD RIGHT WRIST  Result Value Ref Range Status   Specimen Description   Final    BLOOD RIGHT WRIST Performed at [facility], [address] [place], [place] [address]    Special Requests   Final    BOTTLES DRAWN AEROBIC AND ANAEROBIC Blood Culture results may not be optimal due to an inadequate volume of blood received in culture bottles Performed at [facility], [address], [place], [place] [address]    Culture   Final    NO GROWTH 6 DAYS Performed at [facility], [address], [place], [place] [address]    Report Status 06/12/2023 FINAL  Final  MRSA Next Gen by PCR, Nasal     Status: None    Collection Time: 06/06/23  5:39 PM   Specimen: Nasal Mucosa; Nasal Swab  Result Value Ref Range Status   MRSA by PCR Next Gen NOT DETECTED NOT DETECTED Final    Comment: (NOTE) The GeneXpert MRSA Assay (FDA approved for NASAL specimens only), is one component of a comprehensive MRSA colonization surveillance program. It is not intended to diagnose MRSA  infection nor to guide or monitor treatment for MRSA infections. Test performance is not FDA approved in patients less than 93 years old. Performed at [facility], [address] [place], [place] [address]   Body fluid culture w Gram Stain     Status: None (Preliminary result)   Collection Time: 06/08/23  3:01 PM   Specimen: PATH Cytology Pleural fluid  Result Value Ref Range Status   Specimen Description   Final    PLEURAL LEFT SIDE Performed at [facility], [address], [place], [place] [address]    Special Requests   Final    NONE Performed at [facility], [address], [place], [place] [address]    Gram Stain NO WBC SEEN NO ORGANISMS SEEN   Final   Culture   Final    NO GROWTH 3 DAYS Performed at [facility], [address] [place], [place] [address]    Report Status PENDING  Incomplete         Radiology Studies: No results found.       Scheduled Meds:  amLODipine  10 mg Oral Daily   atorvastatin  20 mg Oral Q M,W,F   benztropine  0.5 mg Oral QHS   cholecalciferol  5,000 Units Oral Daily   doxycycline  100 mg Oral Q12H   fenofibrate  160 mg Oral Daily   folic acid  2 mg Oral Daily   gabapentin  200 mg Oral QHS   labetalol  450 mg Oral BID   levothyroxine  88 mcg Oral Q0600   lurasidone  40 mg Oral Q breakfast   nystatin   Topical TID   pantoprazole  40 mg Oral Daily   polyethylene glycol  17 g Oral BID   Continuous Infusions:  ceFEPime (MAXIPIME) IV 2 g (06/12/23 0522)     LOS: 6 days    Time spent: 35  minutes    Roxanne Panek A Calene Paradiso, MD [facility]   If 7PM-7AM, please contact night-coverage [email or web address]  06/12/2023, 12:26 PM

## 2023-06-12 NOTE — Plan of Care (Signed)

## 2023-06-12 NOTE — TOC PASRR Note (Signed)
30 Day PASRR Note   Patient Details  Name: Madison Palmer Date of Birth: May 13, 1965   Transition of Care [facility]) CM/SW Contact:    Otelia Santee, LCSW Phone Number: 06/12/2023, 2:36 PM  To Whom It May Concern:  Please be advised that this patient will require a short-term nursing home stay - anticipated 30 days or less for rehabilitation and strengthening.   The plan is for return home.

## 2023-06-12 NOTE — TOC Progression Note (Addendum)
Transition of Care [facility]) - Progression Note    Patient Details  Name: Madison Palmer MRN: [id number] Date of Birth: 1965/12/05  Transition of Care [facility]) CM/SW Contact  Otelia Santee, LCSW Phone Number: 06/12/2023, 2:14 PM  Clinical Narrative:    Pt oriented x 1 only. Pt with limited to no support at home. As pt does not have reliable support at home pt would not be a candidate for CIR and is recommended for SNF.  Left VM for pt's contact Celene Skeen to discuss disposition.    ADDENDUM: Spoke with pt's spouse who is separated from pt but, still involved and supportive. He currently lives in [place] but plans to come to [facility] on Saturday to provide additional support to pt and their son. Mr. Excell Seltzer is agreeable to SNF recommendation. PASRR requested and additional records have been faxed in for review. Referrals have been sent out for SNF placement and currently awaiting bed offers.   ADDENDUM: PASRR returned: [id number]. Valid from 06/12/23 to 07/11/23.   Expected Discharge Plan: IP Rehab Facility Barriers to Discharge: No Barriers Identified  Expected Discharge Plan and Services In-house Referral: Clinical Social Work Discharge Planning Services: NA Post Acute Care Choice: IP Rehab Living arrangements for the past 2 months: Single Family Home                                       Social Determinants of Health (SDOH) Interventions SDOH Screenings   Food Insecurity: No Food Insecurity (06/06/2023)  Housing: Low Risk  (06/08/2023)  Transportation Needs: No Transportation Needs (06/06/2023)  Utilities: Not At Risk (06/06/2023)  Alcohol Screen: Low Risk  (09/13/2018)  Depression (PHQ2-9): Low Risk  (03/15/2023)  Financial Resource Strain: Low Risk  (07/03/2022)  Physical Activity: Inactive (07/03/2022)  Social Connections: Unknown (11/11/2018)   [facility]  Stress: No Stress Concern Present (07/03/2022)  Tobacco  Use: Low Risk  (06/06/2023)    Readmission Risk Interventions    06/07/2023    1:53 PM  Readmission Risk Prevention Plan  Transportation Screening Complete  PCP or Specialist Appt within 5-7 Days Complete  Home Care Screening Complete  Medication Review (RN CM) Complete

## 2023-06-12 NOTE — Progress Notes (Addendum)
  Inpatient Rehabilitation Admissions Coordinator   I spoke with patient by phone to follow up on our discussions from Friday. She still prefers direct discharge home with her 58 yo son. Does not want to pursue Cir admit. I will alert acute team and TOC of her preference.  Ottie Glazier, RN, MSN Rehab Admissions Coordinator [phone] 06/12/2023 12:37 PM  TOC and therapy with concerns about patient's decision to go directly home with son. I called patient again. She states she is aware that therapy is "hesitant" for her to discharge home. She and her spouse have been legally separated for 6 years. He lives in [place]. They do not communicate per patient. She is in agreement for me to contact her 42 yo son, Olena Leatherwood, to discuss her dispo. I have left him a voicemail.  Ottie Glazier, RN, MSN Rehab Admissions Coordinator [phone] 06/12/2023 1:41 PM

## 2023-06-12 NOTE — Progress Notes (Addendum)
Physical Therapy Treatment Patient Details Name: Madison Palmer MRN: [id number] DOB: 09-15-1965 Today's Date: 06/12/2023   History of Present Illness Madison Palmer is a 58 y.o. female presents to ED 2//25  with progressive Lower extremity weakness, SOB,  CT angiogram of the chest was negative for PE but did show a moderate-sized left pleural effusion with  also evidence of metastatic disease. Labs show mild hypokalemia macrocytic anemia and neutropenia.  OZD:GUYQI IV lung cancer on chemotherapy and Keytruda last dose was on 05/29/2023 with history of diastolic CHF, hypertension, hyperlipidemia, sleep apnea, depression, hypothyroidism, morbid obesity    PT Comments  PT - Cognition Comments: AxO x 1.5 with intermittent difficulty finding her words.  Pt on phone having difficulty completing a sentence.  Appears groggy and "lost" but can also get frustrated masking her memory. Also present with mild anxiety.  Required Max encouragement to participate.  Pt quick to say "I can't". Assisted OOB to [facility]  General bed mobility comments: Max encouragement and increased time to transition to EOB. General transfer comment: pt self able to rise and transfer self 1/4 turn from bed to [facility] then back.  Unable to perform self peri care.  "I can't". General Gait Details: Pt declined to walk despite several attempts.  Noted increased anxiety as well as LUQ ABD pain.  Pt repeats, "I can't".  Pt self assisted a few side steps to recliner.  Declined to amb this session. Pt lives home with Son who does not work or go to school.  He is "unreliable" stated pt.  Most of the time, she does not know where he is or what he is doing.   Rec CIR Pt wants to go home but will need 24/7 assist initially for mobility, ADL's and medication management.  14:30 TOC update, Pt NOT a candidate for CIR so will update LPT need for SNF.   If plan is discharge home, recommend the following: A little help with walking and/or transfers;Assistance  with cooking/housework;Assist for transportation;Help with stairs or ramp for entrance;A little help with bathing/dressing/bathroom   Can travel by private vehicle        Equipment Recommendations  Rolling walker (2 wheels)    Recommendations for Other Services       Precautions / Restrictions Precautions Precautions: Fall Precaution/Restrictions Comments: monitor sats Restrictions Weight Bearing Restrictions Per Provider Order: No     Mobility  Bed Mobility Overal bed mobility: Needs Assistance Bed Mobility: Supine to Sit     Supine to sit: Min assist, Contact guard     General bed mobility comments: Max encouragement and increased time to transition to EOB.    Transfers Overall transfer level: Needs assistance Equipment used: Rolling walker (2 wheels), None Transfers: Sit to/from Stand Sit to Stand: Supervision, Contact guard assist           General transfer comment: pt self able to rise and transfer self 1/4 turn from bed to [facility] then back.  Unable to perform self peri care.  "I can't".    Ambulation/Gait         Gait velocity: decreased     General Gait Details: Pt declined to walk despite several attempts.  Noted increased anxiety as well as LUQ ABD pain.  Pt repeats, "I can't".  assisted back to bed from [facility].   Stairs             Wheelchair Mobility     Tilt Bed    Modified Rankin (Stroke Patients Only)  Balance                                            Communication Communication Communication: No apparent difficulties  Cognition Arousal: Alert Behavior During Therapy: Flat affect, Anxious                           PT - Cognition Comments: AxO x 1.5 with intermittent difficulty finding her words.  Also present with mild anxiety.  Required Max encouragement to participate.  Pt quick to say "I can't".        Cueing Cueing Techniques: Verbal cues  Exercises      General Comments         Pertinent Vitals/Pain Pain Assessment Pain Assessment: Faces Faces Pain Scale: Hurts little more Pain Location: upper left ABD just below breats Pain Descriptors / Indicators: Discomfort, Grimacing Pain Intervention(s): Monitored during session, Patient requesting pain meds-RN notified    Home Living                          Prior Function            PT Goals (current goals can now be found in the care plan section) Progress towards PT goals: Progressing toward goals    Frequency    Min 1X/week      PT Plan      Co-evaluation              AM-PAC PT "6 Clicks" Mobility   Outcome Measure  Help needed turning from your back to your side while in a flat bed without using bedrails?: A Lot Help needed moving from lying on your back to sitting on the side of a flat bed without using bedrails?: A Lot Help needed moving to and from a bed to a chair (including a wheelchair)?: A Lot Help needed standing up from a chair using your arms (e.g., wheelchair or bedside chair)?: A Lot Help needed to walk in hospital room?: A Lot Help needed climbing 3-5 steps with a railing? : A Lot 6 Click Score: 12    End of Session Equipment Utilized During Treatment: Gait belt Activity Tolerance: Patient limited by fatigue;Other (comment) (Pt self limiting)   Nurse Communication: Mobility status PT Visit Diagnosis: Unsteadiness on feet (R26.81);Muscle weakness (generalized) (M62.81);Difficulty in walking, not elsewhere classified (R26.2);Pain Pain - Right/Left: Left Pain - part of body:  (ABD)     Time: 1610-9604 PT Time Calculation (min) (ACUTE ONLY): 33 min  Charges:    $Therapeutic Activity: 23-37 mins PT General Charges $$ ACUTE PT VISIT: 1 Visit                     Felecia Shelling  PTA Acute  Rehabilitation Services Office M-F          [phone]

## 2023-06-13 DIAGNOSIS — R29898 Other symptoms and signs involving the musculoskeletal system: Secondary | ICD-10-CM | POA: Diagnosis not present

## 2023-06-13 LAB — CBC WITH DIFFERENTIAL/PLATELET
Abs Immature Granulocytes: 0.33 10*3/uL — ABNORMAL HIGH (ref 0.00–0.07)
Basophils Absolute: 0 10*3/uL (ref 0.0–0.1)
Basophils Relative: 0 %
Eosinophils Absolute: 0 10*3/uL (ref 0.0–0.5)
Eosinophils Relative: 0 %
HCT: 25.3 % — ABNORMAL LOW (ref 36.0–46.0)
Hemoglobin: 7.7 g/dL — ABNORMAL LOW (ref 12.0–15.0)
Immature Granulocytes: 6 %
Lymphocytes Relative: 12 %
Lymphs Abs: 0.7 10*3/uL (ref 0.7–4.0)
MCH: 33.8 pg (ref 26.0–34.0)
MCHC: 30.4 g/dL (ref 30.0–36.0)
MCV: 111 fL — ABNORMAL HIGH (ref 80.0–100.0)
Monocytes Absolute: 0.4 10*3/uL (ref 0.1–1.0)
Monocytes Relative: 6 %
Neutro Abs: 4.6 10*3/uL (ref 1.7–7.7)
Neutrophils Relative %: 76 %
Platelets: 34 10*3/uL — ABNORMAL LOW (ref 150–400)
RBC: 2.28 MIL/uL — ABNORMAL LOW (ref 3.87–5.11)
RDW: 16.5 % — ABNORMAL HIGH (ref 11.5–15.5)
Smear Review: DECREASED
WBC: 6 10*3/uL (ref 4.0–10.5)
nRBC: 0 % (ref 0.0–0.2)

## 2023-06-13 LAB — BASIC METABOLIC PANEL
Anion gap: 9 (ref 5–15)
BUN: 38 mg/dL — ABNORMAL HIGH (ref 6–20)
CO2: 35 mmol/L — ABNORMAL HIGH (ref 22–32)
Calcium: 10.5 mg/dL — ABNORMAL HIGH (ref 8.9–10.3)
Chloride: 99 mmol/L (ref 98–111)
Creatinine, Ser: 1.65 mg/dL — ABNORMAL HIGH (ref 0.44–1.00)
GFR, Estimated: 36 mL/min — ABNORMAL LOW (ref >60)
Glucose, Bld: 120 mg/dL — ABNORMAL HIGH (ref 70–99)
Potassium: 4.5 mmol/L (ref 3.5–5.1)
Sodium: 143 mmol/L (ref 135–145)

## 2023-06-13 MED ORDER — FUROSEMIDE 40 MG PO TABS
40.0000 mg | ORAL_TABLET | Freq: Every day | ORAL | Status: DC
  Administered 2023-06-13 – 2023-06-15 (×3): 40 mg via ORAL
  Filled 2023-06-13 (×3): qty 1

## 2023-06-13 NOTE — Progress Notes (Signed)
PROGRESS NOTE  Madison Palmer WJX:[id number] DOB: 25-Aug-1965 DOA: 06/05/2023 PCP: Anne Ng, NP   LOS: 7 days   Brief Narrative / Interim history: 58 year old with past medical history significant for stage IV lung cancer on chemotherapy, and Keytruda last dose was 05/29/2023, history of diastolic heart failure, hypertension, hyperlipidemia, sleep apnea, depression, hypothyroidism, who presented with progressive lower extremity weakness over the last 2 weeks.  Patient states that her weakness was initially on the right side and in the last few days presents on both lower extremities.  Patient recently had follow-up with oncologist Dr. Shirline Frees on 1/28 when MRI of the brain was planned.   MRI of the brain 1/21st was negative for acute finding. Patient bilateral lower extremity thought to be secondary to deconditioning.  MRI of the lumbar spine was reviewed by neurosurgery who does not recommend any surgical intervention at this point.  Patient was found to have recurrence of left pleural effusion and underwent thoracentesis.  Also found to be neutropenic and thrombocytopenic required Granix  And platelet transfusion.  Subjective / 24h Interval events: Overall she is doing well, denies any chest pain, denies any shortness of breath.  Assesement and Plan: Principal Problem:   Lower extremity weakness Active Problems:   Hypothyroidism   Obstructive sleep apnea   Essential hypertension   Chronic diastolic HF (heart failure) (HCC)   Class 3 severe obesity with serious comorbidity and body mass index (BMI) of 50.0 to 59.9 in adult [facility])   Adenocarcinoma of left lung, stage 4 (HCC)   Weakness   Neutropenia (HCC)   Neuropathy   Macrocytic anemia   Thrombocytopenia (HCC)   Principal problem Bilateral lower extremity weakness -on admission, case was discussed with neurology.  He underwent an MRI of the brain which without acute findings.  Lumbar spine MRI showed a 7 mm lesion within the  S1 segment, with additional 8 mm lesion within the left sacral ala. Given history of metastatic non-small cell lung cancer, findings are concerning for small osseous metastases. No extra osseous or epidural tumor. Right foraminal to extraforaminal disc protrusion at L2-3, potentially affecting the exiting right L2 nerve root. Moderate to advanced lower lumbar facet hypertrophy, which could contribute to lower back pain.  Case was discussed with neurology as well as neurosurgery with Dr. Franky Macho, she needs physical therapy, no need for additional interventions and outpatient follow-up. -Awaiting SNF  Active problems  Pancytopenia, History of stage IV Lung cancer -Neutropenia in the setting of recent chemotherapy, case was discussed with Dr. Shirline Frees with oncology, patient received Granix, ANC now improved.  She received total of 4 units of platelets, improved appropriately and have remained stable.  Hemoglobin is stable as well, and she has no bleeding   Left pleural effusion concern with malignancy versus loculated, hypoxic respiratory failure - Discussed with Dr. Shirline Frees, underwent thoracentesis.  She also received empiric antibiotics and has completed a course while hospitalized.  Continue supportive care  CKD 3A-baseline creatinine 1.1-1.3, currently close to baseline.  Watch while on furosemide  Panniculitis, pubic area with redness and sluggish skin - Concern with superimposed bacterial cellulitis.  Status post IV antibiotics, continue local care with nystatin powder  Acute Metabolic Encephalopathy - She has been anxious at times, confused, improved today   Hypertension - Continue with labetalol,  amlodipine   Hyperlipidemia - Continue with the statins and fenofibrate   Diastolic heart failure chronic - compensated, resume lasix tomorrow   Depression: Continue Latuda and Cogentin Anxiety, agitation last  night; had haldol. Atarax PRN   Neuropathy - Continue with gabapentin  Mild  hypokalemia - Replaced   Obesity, morbid-BMI 51.  She would benefit from weight loss  Scheduled Meds:  amLODipine  10 mg Oral Daily   atorvastatin  20 mg Oral Q M,W,F   benztropine  0.5 mg Oral QHS   cholecalciferol  5,000 Units Oral Daily   fenofibrate  160 mg Oral Daily   folic acid  2 mg Oral Daily   furosemide  40 mg Oral Daily   gabapentin  200 mg Oral QHS   labetalol  450 mg Oral BID   levothyroxine  88 mcg Oral Q0600   lurasidone  40 mg Oral Q breakfast   nystatin   Topical TID   pantoprazole  40 mg Oral Daily   polyethylene glycol  17 g Oral BID   Continuous Infusions: PRN Meds:.acetaminophen **OR** acetaminophen, hydrOXYzine  Current Outpatient Medications  Medication Instructions   acetaminophen (TYLENOL) 1,000 mg, 3 times daily PRN   amLODipine (NORVASC) 10 mg, Oral, Daily   atorvastatin (LIPITOR) 20 MG tablet TAKE 1 TABLET(20 MG) BY MOUTH 3 TIMES A WEEK   benztropine (COGENTIN) 0.5 mg, Oral, Daily at bedtime   cetirizine (ZYRTEC) 10 mg, Oral, Daily   esomeprazole (NEXIUM) 40 MG capsule TAKE 1 CAPSULE BY MOUTH TWICE DAILY   fenofibrate (TRICOR) 145 MG tablet TAKE 1 TABLET(145 MG) BY MOUTH DAILY   folic acid (FOLVITE) 1 mg, Oral, Daily   furosemide (LASIX) 40 mg, Oral, Daily   gabapentin (NEURONTIN) 100 MG capsule TAKE 2 CAPSULES(200 MG) BY MOUTH AT BEDTIME   labetalol (NORMODYNE) 450 mg, Oral, 2 times daily   levothyroxine (SYNTHROID) 88 mcg, Daily   lisinopril (ZESTRIL) 40 mg, Oral, Daily   lurasidone (LATUDA) 40 mg, Oral, Daily with breakfast   Polyethylene Glycol 3350 (MIRALAX PO) 1 Capful, Daily PRN   Potassium Chloride ER 20 MEQ TBCR 20 mEq, Oral, Daily   prochlorperazine (COMPAZINE) 10 mg, Oral, Every 6 hours PRN   TART CHERRY PO 1 tablet, Daily   Vitamin D3 Maximum Strength 5,000 Units, Daily    Diet Orders (From admission, onward)     Start     Ordered   06/06/23 0530  Diet Heart Room service appropriate? Yes; Fluid consistency: Thin  Diet effective  now       Question Answer Comment  Room service appropriate? Yes   Fluid consistency: Thin      06/06/23 0529            DVT prophylaxis:    Lab Results  Component Value Date   PLT 34 (L) 06/13/2023      Code Status: Full Code  Family Communication: no family at bedside   Status is: Inpatient Remains inpatient appropriate because: severity of illness  Level of care: Telemetry  Consultants:  none  Objective: Vitals:   06/12/23 1925 06/12/23 2230 06/13/23 0449 06/13/23 1255  BP: 138/87  134/78 120/68  Pulse: 92 90 75 74  Resp: 18 (!) 21 20 16   Temp: 97.8 F (36.6 C)  (!) 97.4 F (36.3 C) 97.8 F (36.6 C)  TempSrc:      SpO2: 100% 99% 100% 100%  Weight:      Height:        Intake/Output Summary (Last 24 hours) at 06/13/2023 1450 Last data filed at 06/13/2023 1139 Gross per 24 hour  Intake 240 ml  Output 1400 ml  Net -1160 ml   Wt Readings from  Last 3 Encounters:  06/06/23 128.6 kg  05/29/23 127.7 kg  05/28/23 125.6 kg    Examination:  Constitutional: NAD Eyes: no scleral icterus ENMT: Mucous membranes are moist.  Neck: normal, supple Respiratory: clear to auscultation bilaterally, no wheezing, no crackles. Normal respiratory effort.  Cardiovascular: Regular rate and rhythm, no murmurs / rubs / gallops. No LE edema.  Abdomen: non distended, no tenderness. Bowel sounds positive.  Musculoskeletal: no clubbing / cyanosis.   Data Reviewed: I have independently reviewed following labs and imaging studies   CBC Recent Labs  Lab 06/07/23 0953 06/08/23 0844 06/09/23 0711 06/10/23 0651 06/11/23 0839 06/12/23 0554 06/13/23 0609  WBC 1.4* 2.7* 3.9* 4.7 5.9 6.0 6.0  HGB 9.2* 8.0* 8.1* 8.2* 8.6* 7.7* 7.7*  HCT 29.1* 25.7* 26.4* 26.4* 28.3* 24.6* 25.3*  PLT 42* 29* 17* 11* 14* 32* 34*  MCV 109.4* 110.3* 110.0* 110.5* 110.1* 110.3* 111.0*  MCH 34.6* 34.3* 33.8 34.3* 33.5 34.5* 33.8  MCHC 31.6 31.1 30.7 31.1 30.4 31.3 30.4  RDW 17.0* 16.9* 16.9*  16.6* 16.6* 16.3* 16.5*  LYMPHSABS 0.3* 0.5*  --  0.7 0.8  --  0.7  MONOABS 0.1 0.2  --  0.5 0.5  --  0.4  EOSABS 0.0 0.0  --  0.0 0.0  --  0.0  BASOSABS 0.0 0.0  --  0.0 0.0  --  0.0    Recent Labs  Lab 06/07/23 0953 06/07/23 1418 06/08/23 0844 06/09/23 0711 06/10/23 0651 06/12/23 0557 06/12/23 1435 06/13/23 0609  NA 141  --  140 143 145 141  --  143  K 3.8  --  3.9 4.2 4.3 4.3  --  4.5  CL 99  --  98 101 103 99  --  99  CO2 34*  --  33* 34* 35* 36*  --  35*  GLUCOSE 196*  --  119* 118* 118* 108*  --  120*  BUN 28*  --  33* 37* 34* 37*  --  38*  CREATININE 1.33*  --  1.58* 1.53* 1.50* 1.51*  --  1.65*  CALCIUM 10.2  --  10.2 10.4* 10.7* 10.3  --  10.5*  AST 26  --   --   --   --   --   --   --   ALT 26  --   --   --   --   --   --   --   ALKPHOS 58  --   --   --   --   --   --   --   BILITOT 1.3*  --   --   --   --   --   --   --   ALBUMIN 2.4*  --   --   --   --   --   --   --   MG  --  1.8 2.1  --   --   --   --   --   AMMONIA  --   --   --   --   --   --  38*  --     ------------------------------------------------------------------------------------------------------------------ No results for input(s): "CHOL", "HDL", "LDLCALC", "TRIG", "CHOLHDL", "LDLDIRECT" in the last 72 hours.  Lab Results  Component Value Date   HGBA1C 6.0 (A) 02/01/2023   HGBA1C 6.0 02/01/2023   HGBA1C 6.0 02/01/2023   HGBA1C 6.0 02/01/2023   ------------------------------------------------------------------------------------------------------------------ No results for input(s): "TSH", "T4TOTAL", "T3FREE", "THYROIDAB" in the last 72 hours.  Invalid input(s): "FREET3"  Cardiac Enzymes No results for input(s): "CKMB", "TROPONINI", "MYOGLOBIN" in the last 168 hours.  Invalid input(s): "CK" ------------------------------------------------------------------------------------------------------------------    Component Value Date/Time   BNP 147.2 (H) 09/15/2018 0918    CBG: No results  for input(s): "GLUCAP" in the last 168 hours.  Recent Results (from the past 240 hours)  Culture, blood (Routine X 2) w Reflex to ID Panel     Status: None   Collection Time: 06/06/23  8:55 AM   Specimen: BLOOD LEFT WRIST  Result Value Ref Range Status   Specimen Description   Final    BLOOD LEFT WRIST Performed at [facility], [address] [place], [place] [address]    Special Requests   Final    BOTTLES DRAWN AEROBIC AND ANAEROBIC Blood Culture results may not be optimal due to an inadequate volume of blood received in culture bottles Performed at [facility], [address] [place], [place] [address]    Culture   Final    NO GROWTH 6 DAYS Performed at [facility], [address] [place], [place] [address]    Report Status 06/12/2023 FINAL  Final  Culture, blood (Routine X 2) w Reflex to ID Panel     Status: None   Collection Time: 06/06/23  8:58 AM   Specimen: BLOOD RIGHT WRIST  Result Value Ref Range Status   Specimen Description   Final    BLOOD RIGHT WRIST Performed at [facility], [address], [place], [place] [address]    Special Requests   Final    BOTTLES DRAWN AEROBIC AND ANAEROBIC Blood Culture results may not be optimal due to an inadequate volume of blood received in culture bottles Performed at [facility], [address] [place], [place] [address]    Culture   Final    NO GROWTH 6 DAYS Performed at [facility], [address], [place], [place] [address]    Report Status 06/12/2023 FINAL  Final  MRSA Next Gen by PCR, Nasal     Status: None   Collection Time: 06/06/23  5:39 PM   Specimen: Nasal Mucosa; Nasal Swab  Result Value Ref Range Status   MRSA by PCR Next Gen NOT DETECTED NOT DETECTED Final    Comment: (NOTE) The GeneXpert MRSA Assay (FDA approved for NASAL specimens only), is one component of a comprehensive MRSA colonization surveillance program. It is not intended to  diagnose MRSA infection nor to guide or monitor treatment for MRSA infections. Test performance is not FDA approved in patients less than 24 years old. Performed at [facility], [address] [place], [place] [address]   Body fluid culture w Gram Stain     Status: None   Collection Time: 06/08/23  3:01 PM   Specimen: PATH Cytology Pleural fluid  Result Value Ref Range Status   Specimen Description   Final    PLEURAL LEFT SIDE Performed at [facility], [address], [place], [place] [address]    Special Requests   Final    NONE Performed at [facility], [address], [place], [place] [address]    Gram Stain NO WBC SEEN NO ORGANISMS SEEN   Final   Culture   Final    NO GROWTH 3 DAYS Performed at [facility], [address], [place], [place] [address]    Report Status 06/12/2023 FINAL  Final     Radiology Studies: No results found.   Pamella Pert, MD, PhD [facility]  Between 7 am - 7 pm I am available, please contact me via Amion (for emergencies) or Securechat (non urgent messages)  Between 7 pm - 7 am I am not available, please contact night coverage MD/APP via Amion

## 2023-06-13 NOTE — Progress Notes (Signed)
   06/13/23 2213  BiPAP/CPAP/SIPAP  BiPAP/CPAP/SIPAP Pt Type Adult  BiPAP/CPAP/SIPAP Resmed  Mask Type Nasal mask  Dentures removed? Not applicable  Mask Size Medium  EPAP 9 cmH2O  Flow Rate 3 lpm  Patient Home Equipment No  Auto Titrate No  CPAP/SIPAP surface wiped down Yes  BiPAP/CPAP /SiPAP Vitals  Pulse Rate 66  Resp 16  SpO2 98 %

## 2023-06-13 NOTE — Plan of Care (Signed)

## 2023-06-13 NOTE — TOC CM/SW Note (Signed)
CMS list of facilities and star ratings provided to pt to review for facility preference.       [facility] [address] [place], [place] [address] [phone] Overall rating ??  Below average  [facility] at the [facility] [address] [place], [place] [address] [phone] Overall rating ? Below average  [facility] [address] [place], [place] [address] [phone] Overall rating? Below average  [facility] [address] [place], [place] [address] [phone] Overall rating ? Much below average  [facility] [address] [place], [place] [address] [phone] Overall rating ???? Average  [facility] [address] [place], [place] [address] [phone] Overall rating ? Much below average   [facility] [address] [place], [place] [address] [phone] Overall rating ?? Much below average  [employer] [facility] [employer] [address] [place], [place] [address] [phone] Overall rating ??? Average  [facility] [address] [place], [place] [address] [phone] Overall rating ? Much below average  [facility] [address] [place], [place] [address] [phone] Overall rating ?? Much below average  [facility] [address] [place], [place] [address] [phone] Overall rating ??? Above average  [facility] [address] [place], [place] [address] [phone] Overall rating ????? Much above average  [facility] [address] [place], [place] [address] [phone] Overall rating ???? Above average  [facility] [address] [place], [place]  [address] [phone] Overall rating ????? Much above average  [facility] [name] [place], [place] [address] [phone] Overall rating ????  [facility] [address] [place], [place] [address] [phone] Overall rating ???? Much above average  [facility] [address] [place], [place] [address] [phone] Overall rating ????? Much above average  [facility] [address] [place], [place] [address] [phone] Overall rating ? Much below average  [facility] [address] [place], [place] [address] [phone] Overall rating ??? Average  [facility] [address] [place], [place] [address] [phone] Overall rating ????? Much above average  [facility] [address] [place], [place] [address] [phone] Overall rating ??? Average  [facility] [address] [place], [place] [address] [phone] Overall rating ?? Below average  [facility] [address] [place], [place] [address] [phone] Overall rating ??? Average  [facility] [address] [place], [place] [address] [phone] Overall rating ????? Much above average  [facility] [address] [place], [place] [address] [phone] Overall rating ? Much below average  [facility]	[place] [facility], [place] [address] [phone] Overall rating ????? Much above average  [facility] [address] [place], [place] [address] [phone] Overall rating ??? Much below average  [facility] [address] [place], [place] [address] [phone] Overall rating ??? Average  [facility]  [facility] [address] [place], [place] [address] [phone] Overall rating ??  Below average  [facility] [address] [place], [place] [address] [phone] Overall rating ? Much below average  [facility] [address] [place], [place] [address] [phone] Overall rating ??? Average  [facility] [address] [place], [place] [address] [phone] Overall rating ? Much below average  [facility] [address] [place], [place] [address] [phone] Overall rating ??? Average  [facility] [address] [place], [place] [address] [phone] Overall rating ? Much below average  [place] [address] [place], [place] [address] [phone] Overall rating ????? Much above average  [facility] [address] [place], [place] [address] [phone] Overall rating ? Much below average  [facility] [address] [place], [place] [address] [phone] Overall rating ???? Above average  [facility] [address] [place], [place] [address] [phone] Overall rating ????? Above average  [facility] [address] [place], [place] [address] [phone] Overall rating ???? Above average  [facility] [address] [place], [place] [address] [phone] Overall rating ????? Much above average  [facility] [address] [place], [place] [address] [phone] Overall rating ? Much below average  [facility] [address] [place], [place] [address] [phone] Overall rating ????? Much above average  [facility] [address] [place], [place] [address] [phone] Overall rating ? Much below  average  [facility] [address] [place], [place] [address] [phone] Overall rating ????? Much above average  [facility] [address] [place], [place] [address] [phone] Overall rating ????? Much above average  [facility] [address] [place], [place] [address] [phone] Overall rating ??? Average  [facility] [address] [place], [place] [address] [phone] Overall rating ??? Average  [facility] [address] [place], [place] [address] [phone] Overall rating ???? Above average  [facility] [address] [place], [place] [address] [phone] Overall rating ?? Below average  [facility] [address] [place], [place] [address] [phone] Overall rating ???? Above average  [facility] [address] [place], [place] [address] [phone] Overall rating ?? Below average  [facility] [address] [place], [place] [address] [phone] Overall rating ??

## 2023-06-13 NOTE — TOC Progression Note (Signed)
Transition of Care [facility]) - Progression Note    Patient Details  Name: Madison Palmer MRN: [id number] Date of Birth: 08/07/1965  [facility] CM/SW Contact  Otelia Santee, LCSW Phone Number: 06/13/2023, 12:42 PM  Clinical Narrative:    Met with pt who is now oriented x 3 and is agreeable to SNF placement. Reviewed bed offers with pt who did not have facility preference. Spoke with pt's spouse who accepted [facility]. [facility] to start insurance auth. Pt able to transfer once auth approved.    Expected Discharge Plan: IP Rehab Facility Barriers to Discharge: No Barriers Identified  Expected Discharge Plan and Services In-house Referral: Clinical Social Work Discharge Planning Services: NA Post Acute Care Choice: IP Rehab Living arrangements for the past 2 months: Single Family Home                                       Social Determinants of Health (SDOH) Interventions SDOH Screenings   Food Insecurity: No Food Insecurity (06/06/2023)  Housing: Low Risk  (06/08/2023)  Transportation Needs: No Transportation Needs (06/06/2023)  Utilities: Not At Risk (06/06/2023)  Alcohol Screen: Low Risk  (09/13/2018)  Depression (PHQ2-9): Low Risk  (03/15/2023)  Financial Resource Strain: Low Risk  (07/03/2022)  Physical Activity: Inactive (07/03/2022)  Social Connections: Unknown (11/11/2018)   [facility]  Stress: No Stress Concern Present (07/03/2022)  Tobacco Use: Low Risk  (06/06/2023)    Readmission Risk Interventions    06/07/2023    1:53 PM  Readmission Risk Prevention Plan  Transportation Screening Complete  PCP or Specialist Appt within 5-7 Days Complete  Home Care Screening Complete  Medication Review (RN CM) Complete

## 2023-06-13 NOTE — Progress Notes (Signed)
Physical Therapy Treatment Patient Details Name: Madison Palmer MRN: [id number] DOB: [birth year] Today's Date: 06/13/2023   History of Present Illness Madison Palmer is a 58 y.o. female presents to ED 2//25  with progressive Lower extremity weakness, SOB,  CT angiogram of the chest was negative for PE but did show a moderate-sized left pleural effusion with  also evidence of metastatic disease. Labs show mild hypokalemia macrocytic anemia and neutropenia.  WUJ:WJXBJ IV lung cancer on chemotherapy and Keytruda last dose was on 05/29/2023 with history of diastolic CHF, hypertension, hyperlipidemia, sleep apnea, depression, hypothyroidism, morbid obesity    PT Comments  PT - Cognition Comments: AxO x 1.5 with intermittent difficulty finding her words.  Also present with mild anxiety.  Required Max encouragement to participate.  Pt quick to say "I can't". For most part, pt with flat affect.   Assisted OOB to [facility] then amb in hallway required increased time and + 2 assist for safety. Pt will need ST Rehab at SNF to address mobility and functional decline prior to safely returning home.    If plan is discharge home, recommend the following: A little help with walking and/or transfers;Assistance with cooking/housework;Assist for transportation;Help with stairs or ramp for entrance;A little help with bathing/dressing/bathroom   Can travel by private vehicle     No  Equipment Recommendations       Recommendations for Other Services       Precautions / Restrictions Precautions Precautions: Fall Precaution/Restrictions Comments: monitor sats Restrictions Weight Bearing Restrictions Per Provider Order: No     Mobility  Bed Mobility Overal bed mobility: Needs Assistance Bed Mobility: Supine to Sit     Supine to sit: Min assist, Contact guard     General bed mobility comments: Max encouragement and increased time to transition to EOB.    Transfers Overall transfer level: Needs  assistance Equipment used: Rolling walker (2 wheels), None Transfers: Sit to/from Stand Sit to Stand: Supervision, Contact guard assist           General transfer comment: pt self able to rise and transfer self 1/4 turn from bed to [facility] then back.  Unable to perform self peri care.  "I can't".    Ambulation/Gait Ambulation/Gait assistance: Min assist, Mod assist Gait Distance (Feet): 16 Feet Assistive device: Rolling walker (2 wheels) Gait Pattern/deviations: Step-to pattern, Trunk flexed, Wide base of support, Shuffle Gait velocity: decreased     General Gait Details: tolerated amb in hallway requiring + 2 assist for safety such that recliner was following.  Noted increased anxiety with increased activity.   Stairs             Wheelchair Mobility     Tilt Bed    Modified Rankin (Stroke Patients Only)       Balance                                            Communication    Cognition Arousal: Alert Behavior During Therapy: Flat affect, Anxious                           PT - Cognition Comments: AxO x 1.5 with intermittent difficulty finding her words.  Also present with mild anxiety.  Required Max encouragement to participate.  Pt quick to say "I can't".  Cueing    Exercises      General Comments        Pertinent Vitals/Pain Pain Assessment Pain Assessment: Faces Faces Pain Scale: Hurts little more Pain Location: upper left ABD just below breats Pain Descriptors / Indicators: Discomfort, Grimacing, Crying Pain Intervention(s): Monitored during session    Home Living                          Prior Function            PT Goals (current goals can now be found in the care plan section) Progress towards PT goals: Progressing toward goals    Frequency    Min 1X/week      PT Plan      Co-evaluation              AM-PAC PT "6 Clicks" Mobility   Outcome Measure  Help needed turning  from your back to your side while in a flat bed without using bedrails?: A Lot Help needed moving from lying on your back to sitting on the side of a flat bed without using bedrails?: A Lot Help needed moving to and from a bed to a chair (including a wheelchair)?: A Lot Help needed standing up from a chair using your arms (e.g., wheelchair or bedside chair)?: A Lot Help needed to walk in hospital room?: A Lot Help needed climbing 3-5 steps with a railing? : Total 6 Click Score: 11    End of Session Equipment Utilized During Treatment: Gait belt Activity Tolerance: Patient limited by fatigue Patient left: in chair;with call bell/phone within reach Nurse Communication: Mobility status PT Visit Diagnosis: Unsteadiness on feet (R26.81);Muscle weakness (generalized) (M62.81);Difficulty in walking, not elsewhere classified (R26.2);Pain Pain - Right/Left: Left     Time: 8119-1478 PT Time Calculation (min) (ACUTE ONLY): 24 min  Charges:    $Gait Training: 8-22 mins $Therapeutic Activity: 8-22 mins PT General Charges $$ ACUTE PT VISIT: 1 Visit                     Felecia Shelling  PTA Acute  Rehabilitation Services Office M-F          [phone]

## 2023-06-14 ENCOUNTER — Other Ambulatory Visit: Payer: Self-pay

## 2023-06-14 DIAGNOSIS — R29898 Other symptoms and signs involving the musculoskeletal system: Secondary | ICD-10-CM | POA: Diagnosis not present

## 2023-06-14 LAB — CBC WITH DIFFERENTIAL/PLATELET
Abs Immature Granulocytes: 0.49 10*3/uL — ABNORMAL HIGH (ref 0.00–0.07)
Basophils Absolute: 0 10*3/uL (ref 0.0–0.1)
Basophils Relative: 0 %
Eosinophils Absolute: 0 10*3/uL (ref 0.0–0.5)
Eosinophils Relative: 0 %
HCT: 27.2 % — ABNORMAL LOW (ref 36.0–46.0)
Hemoglobin: 8.2 g/dL — ABNORMAL LOW (ref 12.0–15.0)
Immature Granulocytes: 7 %
Lymphocytes Relative: 10 %
Lymphs Abs: 0.7 10*3/uL (ref 0.7–4.0)
MCH: 33.6 pg (ref 26.0–34.0)
MCHC: 30.1 g/dL (ref 30.0–36.0)
MCV: 111.5 fL — ABNORMAL HIGH (ref 80.0–100.0)
Monocytes Absolute: 0.4 10*3/uL (ref 0.1–1.0)
Monocytes Relative: 6 %
Neutro Abs: 5.2 10*3/uL (ref 1.7–7.7)
Neutrophils Relative %: 77 %
Platelets: 41 10*3/uL — ABNORMAL LOW (ref 150–400)
RBC: 2.44 MIL/uL — ABNORMAL LOW (ref 3.87–5.11)
RDW: 16.3 % — ABNORMAL HIGH (ref 11.5–15.5)
WBC: 6.7 10*3/uL (ref 4.0–10.5)
nRBC: 0.3 % — ABNORMAL HIGH (ref 0.0–0.2)

## 2023-06-14 LAB — BASIC METABOLIC PANEL
Anion gap: 10 (ref 5–15)
BUN: 36 mg/dL — ABNORMAL HIGH (ref 6–20)
CO2: 34 mmol/L — ABNORMAL HIGH (ref 22–32)
Calcium: 10.5 mg/dL — ABNORMAL HIGH (ref 8.9–10.3)
Chloride: 99 mmol/L (ref 98–111)
Creatinine, Ser: 1.45 mg/dL — ABNORMAL HIGH (ref 0.44–1.00)
GFR, Estimated: 42 mL/min — ABNORMAL LOW (ref >60)
Glucose, Bld: 126 mg/dL — ABNORMAL HIGH (ref 70–99)
Potassium: 4.3 mmol/L (ref 3.5–5.1)
Sodium: 143 mmol/L (ref 135–145)

## 2023-06-14 LAB — CYTOLOGY - NON PAP

## 2023-06-14 MED ORDER — NYSTATIN 100000 UNIT/GM EX POWD: Freq: Three times a day (TID) | CUTANEOUS | Status: DC

## 2023-06-14 NOTE — Plan of Care (Signed)

## 2023-06-14 NOTE — TOC Progression Note (Addendum)
Transition of Care [facility]) - Progression Note    Patient Details  Name: Madison Palmer MRN: [id number] Date of Birth: 1965-05-19  Transition of Care [facility]) CM/SW Contact  Otelia Santee, LCSW Phone Number: 06/14/2023, 10:39 AM  Clinical Narrative:    Awaiting insurance approval for SNF. Left VM w/ pt's son regarding bringing pt's home CPAP machine to SNF when she transfers.    Expected Discharge Plan: IP Rehab Facility Barriers to Discharge: No Barriers Identified  Expected Discharge Plan and Services In-house Referral: Clinical Social Work Discharge Planning Services: NA Post Acute Care Choice: IP Rehab Living arrangements for the past 2 months: Single Family Home                                       Social Determinants of Health (SDOH) Interventions SDOH Screenings   Food Insecurity: No Food Insecurity (06/06/2023)  Housing: Low Risk  (06/08/2023)  Transportation Needs: No Transportation Needs (06/06/2023)  Utilities: Not At Risk (06/06/2023)  Alcohol Screen: Low Risk  (09/13/2018)  Depression (PHQ2-9): Low Risk  (03/15/2023)  Financial Resource Strain: Low Risk  (07/03/2022)  Physical Activity: Inactive (07/03/2022)  Social Connections: Unknown (11/11/2018)   [facility] from [facility], [facility]  Stress: No Stress Concern Present (07/03/2022)  Tobacco Use: Low Risk  (06/06/2023)    Readmission Risk Interventions    06/07/2023    1:53 PM  Readmission Risk Prevention Plan  Transportation Screening Complete  PCP or Specialist Appt within 5-7 Days Complete  Home Care Screening Complete  Medication Review (RN CM) Complete

## 2023-06-14 NOTE — Progress Notes (Signed)
Physical Therapy Treatment Patient Details Name: Madison Palmer MRN: [id number] DOB: Feb 18, 1966 Today's Date: 06/14/2023   History of Present Illness Madison Palmer is a 58 y.o. female presents to ED 2//25  with progressive Lower extremity weakness, SOB,  CT angiogram of the chest was negative for PE but did show a moderate-sized left pleural effusion with  also evidence of metastatic disease. Labs show mild hypokalemia macrocytic anemia and neutropenia.  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 Every PT session is a "struggle" to motivate and encourage her to participate. She appears withdrawn and can get over stimulated then shuts down. While amb in the hallway the other day day, she is yelling "I can't, stop". Discussed with her, her need to "fight" for herself. She says she "not giving up" but her actions are in that direction. She repeats, "I can't" then barriers her head. She has the strength to self transfer and walk but her mind set appears "defeated", "hopelessness", "anxious".   Assisted OOB to [facility] then to recliner.  General bed mobility comments: Max encouragement and increased time to transition to EOB. General transfer comment: pt self able to rise and transfer self 1/4 turn from bed to [facility] then back.  Unable to perform self peri care.  "I can't". General Gait Details: Pt CAN amb but despite several attempts to encourage and even had the RN assist to motivate.  Pt repeats "I can't" and sits back down quickly reguarding her safety.  Only willing to get to recliner.  Pt c/o ABD pain 8/10 "I can't I hurt too bad".  Last documented amb was 2/12 16 feet in the hallway.   Pt will need ST Rehab at SNF to address mobility and  functional decline prior to safely returning home.      If plan is discharge home, recommend the following: A little help with walking and/or transfers;Assistance with cooking/housework;Assist for transportation;Help with stairs or ramp for entrance;A little help with bathing/dressing/bathroom   Can travel by private vehicle     No  Equipment Recommendations  Rolling walker (2 wheels)    Recommendations for Other Services       Precautions / Restrictions Precautions Precautions: Fall Precaution/Restrictions Comments: monitor sats Restrictions Weight Bearing Restrictions Per Provider Order: No     Mobility  Bed Mobility Overal bed mobility: Needs Assistance Bed Mobility: Supine to Sit     Supine to sit: Min assist, Contact guard     General bed mobility comments: Max encouragement and increased time to transition to EOB.    Transfers Overall transfer level: Needs assistance Equipment used: Rolling walker (2 wheels), None Transfers: Sit to/from Stand Sit to Stand: Supervision, Contact guard assist           General transfer comment: pt self able to rise and transfer self 1/4 turn from bed to [facility] then back.  Unable to perform self peri care.  "I can't".    Ambulation/Gait               General Gait Details: Pt CAN amb but despite several attempts to encourage and even had the RN assist to motivate.  Pt repeats "I can't" and sits back down quickly reguarding her safety.  Only willing to  get to recliner.   Stairs             Wheelchair Mobility     Tilt Bed    Modified Rankin (Stroke Patients Only)       Balance                                            Communication    Cognition Arousal: Alert Behavior During Therapy: Flat affect, Anxious                           PT - Cognition Comments: AxO x 1.5 with intermittent confusion recalling recent past events/people.  Also present with anxiety.  Required Max  encouragement to participate.  Pt quick to say "I can't".        Cueing Cueing Techniques: Verbal cues  Exercises      General Comments        Pertinent Vitals/Pain Pain Assessment Pain Assessment: 0-10 Pain Score: 7  Pain Location: mid ABD Pain Descriptors / Indicators: Discomfort, Grimacing, Crying Pain Intervention(s): Monitored during session, Patient requesting pain meds-RN notified    Home Living                          Prior Function            PT Goals (current goals can now be found in the care plan section) Progress towards PT goals: Progressing toward goals    Frequency    Min 1X/week      PT Plan      Co-evaluation              AM-PAC PT "6 Clicks" Mobility   Outcome Measure  Help needed turning from your back to your side while in a flat bed without using bedrails?: A Lot Help needed moving from lying on your back to sitting on the side of a flat bed without using bedrails?: A Lot Help needed moving to and from a bed to a chair (including a wheelchair)?: A Lot Help needed standing up from a chair using your arms (e.g., wheelchair or bedside chair)?: A Lot Help needed to walk in hospital room?: A Lot Help needed climbing 3-5 steps with a railing? : A Lot 6 Click Score: 12    End of Session Equipment Utilized During Treatment: Gait belt Activity Tolerance: Other (comment) (self limiting) Patient left: in chair;with call bell/phone within reach Nurse Communication: Mobility status PT Visit Diagnosis: Unsteadiness on feet (R26.81);Muscle weakness (generalized) (M62.81);Difficulty in walking, not elsewhere classified (R26.2);Pain     Time: 1505-1530 PT Time Calculation (min) (ACUTE ONLY): 25 min  Charges:    $Therapeutic Activity: 23-37 mins PT General Charges $$ ACUTE PT VISIT: 1 Visit                     {Ileanna Gemmill  PTA Acute  Rehabilitation Services Office M-F          [phone]

## 2023-06-14 NOTE — Plan of Care (Signed)
Problem: Education: Goal: Knowledge of General Education information will improve Description: Including pain rating scale, medication(s)/side effects and non-pharmacologic comfort measures Outcome: Progressing   Problem: Clinical Measurements: Goal: Ability to maintain clinical measurements within normal limits will improve Outcome: Progressing   Problem: Activity: Goal: Risk for activity intolerance will decrease Outcome: Progressing   Problem: Nutrition: Goal: Adequate nutrition will be maintained Outcome: Progressing   Problem: Coping: Goal: Level of anxiety will decrease Outcome: Progressing   Problem: Safety: Goal: Ability to remain free from injury will improve Outcome: Progressing   Problem: Skin Integrity: Goal: Risk for impaired skin integrity will decrease Outcome: Progressing

## 2023-06-14 NOTE — Progress Notes (Signed)
PROGRESS NOTE  [place] DGU:[id number] DOB: Oct 20, 1965 DOA: 06/05/2023 PCP: Anne Ng, NP   LOS: 8 days   Brief Narrative / Interim history: 58 year old with past medical history significant for stage IV lung cancer on chemotherapy, and Keytruda last dose was 05/29/2023, history of diastolic heart failure, hypertension, hyperlipidemia, sleep apnea, depression, hypothyroidism, who presented with progressive lower extremity weakness over the last 2 weeks.  Patient states that her weakness was initially on the right side and in the last few days presents on both lower extremities.  Patient recently had follow-up with oncologist Dr. Shirline Frees on 1/28 when MRI of the brain was planned.   MRI of the brain 1/21st was negative for acute finding. Patient bilateral lower extremity thought to be secondary to deconditioning.  MRI of the lumbar spine was reviewed by neurosurgery who does not recommend any surgical intervention at this point.  Patient was found to have recurrence of left pleural effusion and underwent thoracentesis.  Also found to be neutropenic and thrombocytopenic required Granix  And platelet transfusion.  Subjective / 24h Interval events: Denies any shortness of breath, denies any chest pain.  Overall doing well.  Assesement and Plan: Principal Problem:   Lower extremity weakness Active Problems:   Hypothyroidism   Obstructive sleep apnea   Essential hypertension   Chronic diastolic HF (heart failure) (HCC)   Class 3 severe obesity with serious comorbidity and body mass index (BMI) of 50.0 to 59.9 in adult [facility])   Adenocarcinoma of left lung, stage 4 (HCC)   Weakness   Neutropenia (HCC)   Neuropathy   Macrocytic anemia   Thrombocytopenia (HCC)   Principal problem Bilateral lower extremity weakness -on admission, case was discussed with neurology.  He underwent an MRI of the brain which without acute findings.  Lumbar spine MRI showed a 7 mm lesion within the S1  segment, with additional 8 mm lesion within the left sacral ala. Given history of metastatic non-small cell lung cancer, findings are concerning for small osseous metastases. No extra osseous or epidural tumor. Right foraminal to extraforaminal disc protrusion at L2-3, potentially affecting the exiting right L2 nerve root. Moderate to advanced lower lumbar facet hypertrophy, which could contribute to lower back pain.  Case was discussed with neurology as well as neurosurgery with Dr. Franky Macho, she needs physical therapy, no need for additional interventions and outpatient follow-up. -SNF pending  Active problems  Pancytopenia, History of stage IV Lung cancer -Neutropenia in the setting of recent chemotherapy, case was discussed with Dr. Shirline Frees with oncology, patient received Granix, ANC now improved.  She received total of 4 units of platelets, improved appropriately and have remained stable.  Hemoglobin is stable as well, improving on its own, platelets improving as well   Left pleural effusion concern with malignancy versus loculated, hypoxic respiratory failure - Discussed with Dr. Shirline Frees, underwent thoracentesis.  She also received empiric antibiotics and has completed a course while hospitalized.  Continue supportive care  CKD 3A-baseline creatinine 1.1-1.3, currently close to baseline.  Creatinine is remaining stable on furosemide.  Due to being normotensive, lisinopril will be held for now  Panniculitis, pubic area with redness and sluggish skin - Concern with superimposed bacterial cellulitis.  Status post IV antibiotics, continue local care with nystatin powder  Acute Metabolic Encephalopathy - She has been anxious at times, confused, improved today   Hypertension - Continue with labetalol,  amlodipine   Hyperlipidemia - Continue with the statins and fenofibrate   Diastolic heart failure chronic -  compensated, resume lasix tomorrow   Depression, history of Bipolar -  Continue Latuda  and Cogentin. Possibly a component of hospital induced delirium, but seems highly anxious at times.    Neuropathy - Continue with gabapentin  Mild hypokalemia - Replaced, potassium now normalized   Obesity, morbid-BMI 51.  She would benefit from weight loss  Scheduled Meds:  amLODipine  10 mg Oral Daily   atorvastatin  20 mg Oral Q M,W,F   benztropine  0.5 mg Oral QHS   cholecalciferol  5,000 Units Oral Daily   fenofibrate  160 mg Oral Daily   folic acid  2 mg Oral Daily   furosemide  40 mg Oral Daily   gabapentin  200 mg Oral QHS   labetalol  450 mg Oral BID   levothyroxine  88 mcg Oral Q0600   lurasidone  40 mg Oral Q breakfast   nystatin   Topical TID   pantoprazole  40 mg Oral Daily   polyethylene glycol  17 g Oral BID   Continuous Infusions: PRN Meds:.acetaminophen **OR** acetaminophen, hydrOXYzine  Current Outpatient Medications  Medication Instructions   acetaminophen (TYLENOL) 1,000 mg, 3 times daily PRN   amLODipine (NORVASC) 10 mg, Oral, Daily   atorvastatin (LIPITOR) 20 MG tablet TAKE 1 TABLET(20 MG) BY MOUTH 3 TIMES A WEEK   benztropine (COGENTIN) 0.5 mg, Oral, Daily at bedtime   cetirizine (ZYRTEC) 10 mg, Oral, Daily   esomeprazole (NEXIUM) 40 MG capsule TAKE 1 CAPSULE BY MOUTH TWICE DAILY   fenofibrate (TRICOR) 145 MG tablet TAKE 1 TABLET(145 MG) BY MOUTH DAILY   folic acid (FOLVITE) 1 mg, Oral, Daily   furosemide (LASIX) 40 mg, Oral, Daily   gabapentin (NEURONTIN) 100 MG capsule TAKE 2 CAPSULES(200 MG) BY MOUTH AT BEDTIME   labetalol (NORMODYNE) 450 mg, Oral, 2 times daily   levothyroxine (SYNTHROID) 88 mcg, Daily   lisinopril (ZESTRIL) 40 mg, Oral, Daily   lurasidone (LATUDA) 40 mg, Oral, Daily with breakfast   Polyethylene Glycol 3350 (MIRALAX PO) 1 Capful, Daily PRN   Potassium Chloride ER 20 MEQ TBCR 20 mEq, Oral, Daily   prochlorperazine (COMPAZINE) 10 mg, Oral, Every 6 hours PRN   TART CHERRY PO 1 tablet, Daily   Vitamin D3 Maximum Strength 5,000  Units, Daily    Diet Orders (From admission, onward)     Start     Ordered   06/06/23 0530  Diet Heart Room service appropriate? Yes; Fluid consistency: Thin  Diet effective now       Question Answer Comment  Room service appropriate? Yes   Fluid consistency: Thin      06/06/23 0529            DVT prophylaxis:    Lab Results  Component Value Date   PLT 41 (L) 06/14/2023      Code Status: Full Code  Family Communication: no family at bedside   Status is: Inpatient Remains inpatient appropriate because: severity of illness  Level of care: Telemetry  Consultants:  none  Objective: Vitals:   06/13/23 2132 06/13/23 2213 06/14/23 0516 06/14/23 1257  BP: (!) 135/91  (!) 111/95 115/73  Pulse: 70 66 66 80  Resp: 18 16 18 17   Temp: 97.9 F (36.6 C)  (!) 97.5 F (36.4 C) 98.4 F (36.9 C)  TempSrc:      SpO2: 100% 98% 100% 93%  Weight:      Height:        Intake/Output Summary (Last 24 hours) at  06/14/2023 1447 Last data filed at 06/14/2023 1300 Gross per 24 hour  Intake 360 ml  Output 2150 ml  Net -1790 ml   Wt Readings from Last 3 Encounters:  06/06/23 128.6 kg  05/29/23 127.7 kg  05/28/23 125.6 kg    Examination:  Constitutional: NAD Eyes: lids and conjunctivae normal, no scleral icterus ENMT: mmm Neck: normal, supple Respiratory: clear to auscultation bilaterally, no wheezing, no crackles. Normal respiratory effort.  Cardiovascular: Regular rate and rhythm, no murmurs / rubs / gallops. No LE edema. Abdomen: soft, no distention, no tenderness. Bowel sounds positive.   Data Reviewed: I have independently reviewed following labs and imaging studies   CBC Recent Labs  Lab 06/08/23 0844 06/09/23 0711 06/10/23 0651 06/11/23 0839 06/12/23 0554 06/13/23 0609 06/14/23 0553  WBC 2.7*   < > 4.7 5.9 6.0 6.0 6.7  HGB 8.0*   < > 8.2* 8.6* 7.7* 7.7* 8.2*  HCT 25.7*   < > 26.4* 28.3* 24.6* 25.3* 27.2*  PLT 29*   < > 11* 14* 32* 34* 41*  MCV 110.3*    < > 110.5* 110.1* 110.3* 111.0* 111.5*  MCH 34.3*   < > 34.3* 33.5 34.5* 33.8 33.6  MCHC 31.1   < > 31.1 30.4 31.3 30.4 30.1  RDW 16.9*   < > 16.6* 16.6* 16.3* 16.5* 16.3*  LYMPHSABS 0.5*  --  0.7 0.8  --  0.7 0.7  MONOABS 0.2  --  0.5 0.5  --  0.4 0.4  EOSABS 0.0  --  0.0 0.0  --  0.0 0.0  BASOSABS 0.0  --  0.0 0.0  --  0.0 0.0   < > = values in this interval not displayed.    Recent Labs  Lab 06/08/23 0844 06/09/23 0711 06/10/23 0651 06/12/23 0557 06/12/23 1435 06/13/23 0609 06/14/23 0553  NA 140 143 145 141  --  143 143  K 3.9 4.2 4.3 4.3  --  4.5 4.3  CL 98 101 103 99  --  99 99  CO2 33* 34* 35* 36*  --  35* 34*  GLUCOSE 119* 118* 118* 108*  --  120* 126*  BUN 33* 37* 34* 37*  --  38* 36*  CREATININE 1.58* 1.53* 1.50* 1.51*  --  1.65* 1.45*  CALCIUM 10.2 10.4* 10.7* 10.3  --  10.5* 10.5*  MG 2.1  --   --   --   --   --   --   AMMONIA  --   --   --   --  38*  --   --     ------------------------------------------------------------------------------------------------------------------ No results for input(s): "CHOL", "HDL", "LDLCALC", "TRIG", "CHOLHDL", "LDLDIRECT" in the last 72 hours.  Lab Results  Component Value Date   HGBA1C 6.0 (A) 02/01/2023   HGBA1C 6.0 02/01/2023   HGBA1C 6.0 02/01/2023   HGBA1C 6.0 02/01/2023   ------------------------------------------------------------------------------------------------------------------ No results for input(s): "TSH", "T4TOTAL", "T3FREE", "THYROIDAB" in the last 72 hours.  Invalid input(s): "FREET3"  Cardiac Enzymes No results for input(s): "CKMB", "TROPONINI", "MYOGLOBIN" in the last 168 hours.  Invalid input(s): "CK" ------------------------------------------------------------------------------------------------------------------    Component Value Date/Time   BNP 147.2 (H) 09/15/2018 0918    CBG: No results for input(s): "GLUCAP" in the last 168 hours.  Recent Results (from the past 240 hours)  Culture,  blood (Routine X 2) w Reflex to ID Panel     Status: None   Collection Time: 06/06/23  8:55 AM   Specimen: BLOOD LEFT WRIST  Result Value Ref Range Status   Specimen Description   Final    BLOOD LEFT WRIST Performed at [facility], [address], [place], [place] [address]    Special Requests   Final    BOTTLES DRAWN AEROBIC AND ANAEROBIC Blood Culture results may not be optimal due to an inadequate volume of blood received in culture bottles Performed at [facility], [address], [place], [place] [address]    Culture   Final    NO GROWTH 6 DAYS Performed at [facility], [address], [place], [place] [address]    Report Status 06/12/2023 FINAL  Final  Culture, blood (Routine X 2) w Reflex to ID Panel     Status: None   Collection Time: 06/06/23  8:58 AM   Specimen: BLOOD RIGHT WRIST  Result Value Ref Range Status   Specimen Description   Final    BLOOD RIGHT WRIST Performed at [facility], [address], [place], [place] [address]    Special Requests   Final    BOTTLES DRAWN AEROBIC AND ANAEROBIC Blood Culture results may not be optimal due to an inadequate volume of blood received in culture bottles Performed at [facility], [address] [place], [place] [address]    Culture   Final    NO GROWTH 6 DAYS Performed at [facility], [address] [place], [place] [address]    Report Status 06/12/2023 FINAL  Final  MRSA Next Gen by PCR, Nasal     Status: None   Collection Time: 06/06/23  5:39 PM   Specimen: Nasal Mucosa; Nasal Swab  Result Value Ref Range Status   MRSA by PCR Next Gen NOT DETECTED NOT DETECTED Final    Comment: (NOTE) The GeneXpert MRSA Assay (FDA approved for NASAL specimens only), is one component of a comprehensive MRSA colonization surveillance program. It is not intended to diagnose MRSA infection nor to guide or monitor treatment for MRSA infections. Test performance is not  FDA approved in patients less than 65 years old. Performed at [facility], [address] [place], [place] [address]   Body fluid culture w Gram Stain     Status: None   Collection Time: 06/08/23  3:01 PM   Specimen: PATH Cytology Pleural fluid  Result Value Ref Range Status   Specimen Description   Final    PLEURAL LEFT SIDE Performed at [facility], [address] [place], [place] [address]    Special Requests   Final    NONE Performed at [facility], [address] [place], [place] [address]    Gram Stain NO WBC SEEN NO ORGANISMS SEEN   Final   Culture   Final    NO GROWTH 3 DAYS Performed at [facility], [address], [place], [place] [address]    Report Status 06/12/2023 FINAL  Final     Radiology Studies: No results found.   Pamella Pert, MD, PhD [facility]  Between 7 am - 7 pm I am available, please contact me via Amion (for emergencies) or Securechat (non urgent messages)  Between 7 pm - 7 am I am not available, please contact night coverage MD/APP via Amion

## 2023-06-14 NOTE — Progress Notes (Signed)
   06/14/23 2314  BiPAP/CPAP/SIPAP  BiPAP/CPAP/SIPAP Pt Type Adult  BiPAP/CPAP/SIPAP Resmed  Mask Type Nasal mask  Mask Size Medium  EPAP 9 cmH2O  Flow Rate 2 lpm  Patient Home Equipment No  Auto Titrate No

## 2023-06-15 ENCOUNTER — Ambulatory Visit: Payer: BC Managed Care – PPO | Admitting: Nurse Practitioner

## 2023-06-15 DIAGNOSIS — R29898 Other symptoms and signs involving the musculoskeletal system: Secondary | ICD-10-CM | POA: Diagnosis not present

## 2023-06-15 LAB — CBC WITH DIFFERENTIAL/PLATELET
Abs Immature Granulocytes: 0.52 10*3/uL — ABNORMAL HIGH (ref 0.00–0.07)
Basophils Absolute: 0 10*3/uL (ref 0.0–0.1)
Basophils Relative: 0 %
Eosinophils Absolute: 0 10*3/uL (ref 0.0–0.5)
Eosinophils Relative: 0 %
HCT: 25.2 % — ABNORMAL LOW (ref 36.0–46.0)
Hemoglobin: 8.1 g/dL — ABNORMAL LOW (ref 12.0–15.0)
Immature Granulocytes: 7 %
Lymphocytes Relative: 10 %
Lymphs Abs: 0.8 10*3/uL (ref 0.7–4.0)
MCH: 34.9 pg — ABNORMAL HIGH (ref 26.0–34.0)
MCHC: 32.1 g/dL (ref 30.0–36.0)
MCV: 108.6 fL — ABNORMAL HIGH (ref 80.0–100.0)
Monocytes Absolute: 0.4 10*3/uL (ref 0.1–1.0)
Monocytes Relative: 5 %
Neutro Abs: 5.9 10*3/uL (ref 1.7–7.7)
Neutrophils Relative %: 78 %
Platelets: 59 10*3/uL — ABNORMAL LOW (ref 150–400)
RBC: 2.32 MIL/uL — ABNORMAL LOW (ref 3.87–5.11)
RDW: 16.3 % — ABNORMAL HIGH (ref 11.5–15.5)
WBC: 7.7 10*3/uL (ref 4.0–10.5)
nRBC: 0.4 % — ABNORMAL HIGH (ref 0.0–0.2)

## 2023-06-15 LAB — BASIC METABOLIC PANEL
Anion gap: 7 (ref 5–15)
BUN: 41 mg/dL — ABNORMAL HIGH (ref 6–20)
CO2: 35 mmol/L — ABNORMAL HIGH (ref 22–32)
Calcium: 10.3 mg/dL (ref 8.9–10.3)
Chloride: 97 mmol/L — ABNORMAL LOW (ref 98–111)
Creatinine, Ser: 1.72 mg/dL — ABNORMAL HIGH (ref 0.44–1.00)
GFR, Estimated: 34 mL/min — ABNORMAL LOW (ref >60)
Glucose, Bld: 129 mg/dL — ABNORMAL HIGH (ref 70–99)
Potassium: 4.3 mmol/L (ref 3.5–5.1)
Sodium: 139 mmol/L (ref 135–145)

## 2023-06-15 NOTE — Plan of Care (Signed)

## 2023-06-15 NOTE — TOC Transition Note (Signed)
Transition of Care [facility]) - Discharge Note   Patient Details  Name: [place] Palmer MRN: [id number] Date of Birth: June 02, 1965  [facility] CM/SW Contact:  Otelia Santee, LCSW Phone Number: 06/15/2023, 9:53 AM   Clinical Narrative:    Pt's insurance approved for SNF. Pt able to transfer to [facility] today. Pt will be going to room 111p. RN to call report to [phone]. DC packet placed at RN station. Spoke with pt and spouse and confirmed DC plan. Pt's spouse to call their son to have son deliver pt's CPAP to facility today. [facility] called at 9:44am for transportation.    Final next level of care: Skilled Nursing Facility Barriers to Discharge: No Barriers Identified   Patient Goals and CMS Choice Patient states their goals for this hospitalization and ongoing recovery are:: To go to rehab CMS Medicare.gov Compare Post Acute Care list provided to:: Patient Represenative (must comment) Choice offered to / list presented to : Spouse [place] ownership interest in [facility].provided to:: Spouse    Discharge Placement PASRR number recieved: 06/12/23            Patient [facility] bed at: [facility] Patient to be transferred to facility by: [facility] Name of family member notified: Pt and spouse Patient and family notified of of transfer: 06/15/23  Discharge Plan and Services Additional resources added to the After Visit Summary for   In-house Referral: Clinical Social Work Discharge Planning Services: NA Post Acute Care Choice: IP Rehab          DME Arranged: N/A DME Agency: NA                  Social Drivers of Health (SDOH) Interventions SDOH Screenings   Food Insecurity: No Food Insecurity (06/06/2023)  Housing: Low Risk  (06/08/2023)  Transportation Needs: No Transportation Needs (06/06/2023)  Utilities: Not At Risk (06/06/2023)  Alcohol Screen: Low Risk  (09/13/2018)  Depression (PHQ2-9): Low Risk  (03/15/2023)  Financial Resource Strain: Low  Risk  (07/03/2022)  Physical Activity: Inactive (07/03/2022)  Social Connections: Unknown (11/11/2018)   Received from [facility]  Stress: No Stress Concern Present (07/03/2022)  Tobacco Use: Low Risk  (06/06/2023)     Readmission Risk Interventions    06/15/2023    9:50 AM 06/07/2023    1:53 PM  Readmission Risk Prevention Plan  Transportation Screening Complete Complete  PCP or Specialist Appt within 5-7 Days  Complete  PCP or Specialist Appt within 3-5 Days Complete   Home Care Screening  Complete  Medication Review (RN CM)  Complete  HRI or Home Care Consult Complete   Social Work Consult for Recovery Care Planning/Counseling Complete   Palliative Care Screening Not Applicable   Medication Review Oceanographer) Complete

## 2023-06-15 NOTE — Progress Notes (Signed)
I called the SNF, and give report to [place] the RN.

## 2023-06-15 NOTE — Discharge Summary (Signed)
Physician Discharge Summary  Madison Palmer:[id number] DOB: 09-Oct-1965 DOA: 06/05/2023  PCP: Anne Ng, NP  Admit date: 06/05/2023 Discharge date: 06/15/2023  Admitted From: home Disposition:  SNF  Recommendations for Outpatient Follow-up:  Follow up with PCP in 1-2 weeks Follow up with outpatient psychiatry in 2-3 weeks  Home Health: none Equipment/Devices: none  Discharge Condition: stable CODE STATUS: Full code Diet Orders (From admission, onward)     Start     Ordered   06/06/23 0530  Diet Heart Room service appropriate? Yes; Fluid consistency: Thin  Diet effective now       Question Answer Comment  Room service appropriate? Yes   Fluid consistency: Thin      06/06/23 0529            HPI: Per admitting MD, Madison Palmer is a 58 y.o. female with history of stage IV lung cancer on chemotherapy and Keytruda last dose was on 05/29/2023 with history of diastolic CHF, hypertension, hyperlipidemia, sleep apnea, depression, hypothyroidism has been experiencing progressive lower extremity weakness over the last 2 weeks.  Patient states that her weakness was initially on the right side and in the the last few days present on both lower extremities.  Patient recently had followed with oncologist Dr. Arbutus Ped on 05/29/2023 when MRI of the brain was planned.  Patient did have an MRI of the brain on 05/22/2023 which did not show anything acute.  Patient denies any incontinence of urine or bowel or any back pain or falls.  Patient states the weakness is that she is not able to lift her lower extremities against gravity but she is able to walk with the help of a cane.  Denies any tingling or numbness of the lower extremities.  Denies any weakness of the upper extremities.   Hospital Course / Discharge diagnoses: Principal Problem:   Lower extremity weakness Active Problems:   Hypothyroidism   Obstructive sleep apnea   Essential hypertension   Chronic diastolic HF (heart  failure) (HCC)   Class 3 severe obesity with serious comorbidity and body mass index (BMI) of 50.0 to 59.9 in adult [facility])   Adenocarcinoma of left lung, stage 4 (HCC)   Weakness   Neutropenia (HCC)   Neuropathy   Macrocytic anemia   Thrombocytopenia (HCC)   Principal problem Bilateral lower extremity weakness -on admission, case was discussed with neurology.  She underwent an MRI of the brain which without acute findings.  Lumbar spine MRI showed a 7 mm lesion within the S1 segment, with additional 8 mm lesion within the left sacral ala. Given history of metastatic non-small cell lung cancer, findings are concerning for small osseous metastases. No extra osseous or epidural tumor. Right foraminal to extraforaminal disc protrusion at L2-3, potentially affecting the exiting right L2 nerve root. Moderate to advanced lower lumbar facet hypertrophy, which could contribute to lower back pain.  Case was discussed with neurology as well as neurosurgery with Dr. Franky Macho, she needs physical therapy, no need for additional interventions and outpatient follow-up. Continue SNF level therapy   Active problems  Pancytopenia, History of stage IV Lung cancer -Neutropenia in the setting of recent chemotherapy, case was discussed with Dr. Shirline Frees with oncology, patient received Granix, ANC now improved.  She received total of 4 units of platelets, improved appropriately and have remained stable.  Hemoglobin is stable as well, improving on its own, platelets improving as well Depression, history of Bipolar -  Continue Latuda and Cogentin. Possibly a  component of hospital induced delirium, but seems highly anxious at times.  Patient seeing psychiatry as an outpatient, she also tells me that the she occasionally gets more anxious, please redirect as able.  Recommend she sees her primary psychiatrist within the next 2 to 3 weeks following discharge.  She is seeing Dr. Lolly Mustache, last evaluated December 2024 Left pleural  effusion concern with malignancy versus loculated, hypoxic respiratory failure - Discussed with Dr. Shirline Frees, underwent thoracentesis.  She also received empiric antibiotics and has completed a course while hospitalized.  Continue supportive care CKD 3A-baseline creatinine 1.1-1.3, currently close to baseline.  Creatinine is remaining stable on furosemide.  Due to being normotensive, lisinopril will be held for now Panniculitis, pubic area with redness and sluggish skin - Concern with superimposed bacterial cellulitis.  Status post IV antibiotics, continue local care with nystatin powder Acute Metabolic Encephalopathy - She has been anxious at times, confused, improved  Hypertension - Continue with labetalol,  amlodipine  Hyperlipidemia - Continue with the statins and fenofibrate Diastolic heart failure chronic - compensated Neuropathy - Continue with gabapentin Mild hypokalemia - Replaced, potassium now normalized Obesity, morbid-BMI 51.  She would benefit from weight loss  Sepsis ruled out   Discharge Instructions   Allergies as of 06/15/2023       Reactions   Other Other (See Comments)   Hydralazine Hcl Other reaction(s): Other (See Comments) Instructed not to take by Cardiology.   Fentanyl Hives   Other reaction(s): hives had fentanyl recently with benadryl, did OK   Levofloxacin Hives   Midazolam Hives   Pollen Extract    seasonal   Atorvastatin Other (See Comments)   Muscle pain in legs  Abdominal pain   Hydralazine Hcl Other (See Comments)   Hypercalcemia    Rosuvastatin Other (See Comments)   Abdominal pain Other reaction(s): diarrhea        Medication List     STOP taking these medications    lisinopril 40 MG tablet Commonly known as: ZESTRIL   Potassium Chloride ER 20 MEQ Tbcr       TAKE these medications    acetaminophen 500 MG tablet Commonly known as: TYLENOL Take 1,000 mg by mouth 3 (three) times daily as needed for moderate pain or headache.    amLODipine 10 MG tablet Commonly known as: NORVASC TAKE 1 TABLET BY MOUTH EVERY DAY   atorvastatin 20 MG tablet Commonly known as: LIPITOR TAKE 1 TABLET(20 MG) BY MOUTH 3 TIMES A WEEK   benztropine 0.5 MG tablet Commonly known as: COGENTIN Take 1 tablet (0.5 mg total) by mouth at bedtime.   cetirizine 10 MG tablet Commonly known as: ZYRTEC TAKE 1 TABLET BY MOUTH EVERY DAY   esomeprazole 40 MG capsule Commonly known as: NEXIUM TAKE 1 CAPSULE BY MOUTH TWICE DAILY   fenofibrate 145 MG tablet Commonly known as: TRICOR TAKE 1 TABLET(145 MG) BY MOUTH DAILY   folic acid 1 MG tablet Commonly known as: FOLVITE TAKE 1 TABLET BY MOUTH DAILY   furosemide 40 MG tablet Commonly known as: LASIX TAKE 1 TABLET BY MOUTH EVERY DAY   gabapentin 100 MG capsule Commonly known as: NEURONTIN TAKE 2 CAPSULES(200 MG) BY MOUTH AT BEDTIME   labetalol 300 MG tablet Commonly known as: NORMODYNE TAKE 1 AND 1/2 TABLETS BY MOUTH TWICE DAILY   levothyroxine 88 MCG tablet Commonly known as: SYNTHROID Take 88 mcg by mouth daily.   lurasidone 40 MG Tabs tablet Commonly known as: Latuda Take 1 tablet (40 mg  total) by mouth daily with breakfast.   MIRALAX PO Take 1 Capful by mouth daily as needed (Constipation).   nystatin powder Commonly known as: MYCOSTATIN/NYSTOP Apply topically 3 (three) times daily.   prochlorperazine 10 MG tablet Commonly known as: COMPAZINE Take 1 tablet (10 mg total) by mouth every 6 (six) hours as needed.   TART CHERRY PO Take 1 tablet by mouth daily.   Vitamin D3 Maximum Strength 125 MCG (5000 UT) capsule Generic drug: Cholecalciferol Take 5,000 Units by mouth daily.        Contact information for after-discharge care     Destination     [facility] Preferred SNF .   Service: Skilled Nursing Contact information: [address] [place] [address] [phone]                      Consultations: none  Procedures/Studies:  DG Chest Port 1 View Result Date: 06/08/2023 CLINICAL DATA:  [id number] S/P left thoracentesis [id number] EXAM: PORTABLE CHEST 1 VIEW COMPARISON:  06/07/2023 chest radiograph. FINDINGS: Stable cardiomediastinal silhouette with mild cardiomegaly. No pneumothorax. Trace residual left pleural effusion, decreased. No right pleural effusion. Cephalization of the pulmonary vasculature without overt pulmonary edema. Left retrocardiac and streaky left lung base opacity, decreased. IMPRESSION: 1. No pneumothorax. Trace residual left pleural effusion, decreased. 2. Left retrocardiac and streaky left lung base opacity, decreased, favor atelectasis. 3. Mild cardiomegaly without overt pulmonary edema. Electronically Signed   By: Delbert Phenix M.D.   On: 06/08/2023 16:55   US THORACENTESIS ASP PLEURAL SPACE W/IMG GUIDE Result Date: 06/08/2023 INDICATION: 58 year old female with non-small cell lung adenocarcinoma, now with left pleural effusion. Request for diagnostic and therapeutic thoracentesis. EXAM: ULTRASOUND GUIDED LEFT THORACENTESIS MEDICATIONS: 10 mL 1% lidocaine. COMPLICATIONS: None immediate. PROCEDURE: An ultrasound guided thoracentesis was thoroughly discussed with the patient and questions answered. The benefits, risks, alternatives and complications were also discussed. The patient understands and wishes to proceed with the procedure. Written consent was obtained. Ultrasound was performed to localize and mark an adequate pocket of fluid in the left chest. The area was then prepped and draped in the normal sterile fashion. 1% Lidocaine was used for local anesthesia. Under ultrasound guidance a 6 Fr Safe-T-Centesis catheter was introduced. Thoracentesis was performed. The catheter was removed and a dressing applied. FINDINGS: A total of approximately 400 mL of yellow fluid was removed. Samples were sent to the laboratory as requested by the clinical team. IMPRESSION:  Successful ultrasound guided left thoracentesis yielding 400 mL of pleural fluid. Performed by: Loyce Dys PA-C Electronically Signed   By: Corlis Leak M.D.   On: 06/08/2023 15:38   DG CHEST PORT 1 VIEW Result Date: 06/07/2023 CLINICAL DATA:  Shortness of breath and weakness. EXAM: PORTABLE CHEST 1 VIEW COMPARISON:  Chest CT dated 06/06/2023. FINDINGS: Small left pleural effusion and left lung base atelectasis or infiltrate. The right lung is clear. No pneumothorax. Stable cardiac silhouette. No acute osseous pathology. IMPRESSION: Small left pleural effusion and left lung base atelectasis or infiltrate. A Electronically Signed   By: Elgie Collard M.D.   On: 06/07/2023 15:06   MR BRAIN WO CONTRAST Result Date: 06/07/2023 CLINICAL DATA:  Neuro deficit, acute, stroke suspected. EXAM: MRI HEAD WITHOUT CONTRAST TECHNIQUE: Multiplanar, multiecho pulse sequences of the brain and surrounding structures were obtained without intravenous contrast. COMPARISON:  MRI of the brain May 22, 2023. FINDINGS: Incomplete study due to patient inability to lie flat in the scanner for the  duration of the study. Images obtained are partially degraded by motion. Brain: No acute infarction, hemorrhage, hydrocephalus, extra-axial collection or mass lesion. Vascular: Normal flow voids. Skull and upper cervical spine: Decreased T1 signal of calvarium, likely related to history of anemia. Sinuses/Orbits: Mild mucosal thickening in on the right maxillary sinus. The orbits are grossly unremarkable. Other: None. IMPRESSION: 1. Incomplete study. Images obtained are partially degraded by motion. 2. No acute intracranial abnormality identified. Electronically Signed   By: Baldemar Lenis M.D.   On: 06/07/2023 10:08   CT Angio Chest PE W and/or Wo Contrast Result Date: 06/06/2023 CLINICAL DATA:  Increasing weakness following chemotherapy last week. History of non-small cell lung cancer, chronic respiratory distress.  Portable chest obtained prior to CT. EXAM: CT ANGIOGRAPHY CHEST WITH CONTRAST TECHNIQUE: Multidetector CT imaging of the chest was performed using the standard protocol during bolus administration of intravenous contrast. Multiplanar CT image reconstructions and MIPs were obtained to evaluate the vascular anatomy. RADIATION DOSE REDUCTION: This exam was performed according to the departmental dose-optimization program which includes automated exposure control, adjustment of the mA and/or kV according to patient size and/or use of iterative reconstruction technique. CONTRAST:  OMNIPAQUE IOHEXOL 350 MG/ML SOLN COMPARISON:  Chest CT with contrast 05/22/2023, PET-CT 03/15/2023. FINDINGS: Cardiovascular: There is moderate left main and three-vessel coronary artery calcific plaque. Mild cardiomegaly with a left chamber predominance and mild aortic atherosclerosis. There is no aortic or great vessel aneurysm, dissection or stenosis. The pulmonary arteries and veins are normal in caliber without evidence for arterial embolic disease. There is no pericardial effusion. Mediastinum/Nodes: No new adenopathy. The subcarinal lymph node measuring 1.6 cm in short axis was previously 1.5 cm. A left hilar lymph node is again noted measuring 1.4 cm in short axis on 4:59, previously 1.4 cm. There is a stable borderline prominent low right paratracheal space lymph node. Axillary spaces are clear. The trachea, main bronchi, thoracic esophagus are unremarkable. Lungs/Pleura: There is increasing now moderate sized left pleural effusion, some of which appears to be at least partially loculated in the anterior lower chest. There is no right-sided effusion. Some of the left pleural fluid tracks into the inferior interlobar fissure, more than previously. There are numerous diffuse metastatic lung nodules bilaterally. Again, the largest is in the medial left lower lobe base measuring 4.9 cm on 12:82, previously 4.1 cm when measured in  similar fashion. Index right lower lobe perihilar lesion on 12:65 is stable in size at 12 mm, masses another right lower lobe nodule measuring 1.7 cm on 12:82. Others are much smaller. Rest of the lungs exhibit mosaicism consistent with air trapping and small airways disease, without appreciable focal pneumonia. Upper Abdomen: Diffuse innumerable hepatic metastases were better demonstrated on prior CT. In the phase of contrast in which the liver was imaged today it is difficult to distinguish individual metastases. Left adrenal nodule is stable. Minimal perihepatic ascites with no acute upper abdominal findings. Musculoskeletal: Bridging enthesopathy multiple thoracic spine levels. No destructive bone lesions in the thorax. Review of the MIP images confirms the above findings. IMPRESSION: 1. No evidence of arterial embolic disease. 2. Increasing now moderate sized left pleural effusion, some of which appears to be at least partially loculated in the anterior lower chest. 3. Numerous diffuse metastatic lung nodules bilaterally, the largest in the medial left lower lobe base measuring 4.9 cm, previously 4.1 cm. 4. Stable mediastinal and left hilar adenopathy. 5. Diffuse innumerable hepatic metastases were better demonstrated on prior CT.  In the phase of contrast in which the liver was imaged today it is difficult to distinguish individual metastases. 6. Stable left adrenal nodule. 7. Aortic and coronary artery atherosclerosis. Mild cardiomegaly. 8. Mosaic lung attenuation consistent with air trapping and small airways disease. Aortic Atherosclerosis (ICD10-I70.0). Electronically Signed   By: Almira Bar M.D.   On: 06/06/2023 04:14   DG Chest Portable 1 View Result Date: 06/06/2023 CLINICAL DATA:  Shortness of breath. History of lung cancer with chemotherapy every 3 weeks. Recent chemotherapy last week. Increasing weakness over the last few days. EXAM: PORTABLE CHEST 1 VIEW COMPARISON:  CT chest abdomen and pelvis  05/22/2023. FINDINGS: Shallow inspiration. Mild cardiac enlargement. Infiltration or atelectasis in the left lung base with small left pleural effusion. This could be edema or pneumonia. Fine nodular changes in the lungs are better seen on prior CT. No pneumothorax. Degenerative changes in the spine. IMPRESSION: Cardiac enlargement. Small left pleural effusion with basilar atelectasis or infiltration. Known pulmonary nodules are better seen on prior CT. Electronically Signed   By: Burman Nieves M.D.   On: 06/06/2023 00:42   MR Lumbar Spine W Wo Contrast Result Date: 06/05/2023 CLINICAL DATA:  Initial evaluation for acute myelopathy. History of metastatic non-small cell lung cancer. EXAM: MRI LUMBAR SPINE WITHOUT AND WITH CONTRAST TECHNIQUE: Multiplanar and multiecho pulse sequences of the lumbar spine were obtained without and with intravenous contrast. CONTRAST:  10mL GADAVIST GADOBUTROL 1 MMOL/ML IV SOLN COMPARISON:  Comparison made with prior CT from 05/22/2023 and PET-CT from 03/15/2023 FINDINGS: Segmentation: Standard. Lowest well-formed disc space labeled the L5-S1 level. Alignment: 3 mm degenerative retrolisthesis of L5 on S1, with trace 2 mm anterolisthesis of L4 on L5. Findings chronic and facet mediated. Alignment otherwise normal preservation of the normal lumbar lordosis. Vertebrae: Vertebral body height maintained without acute or chronic fracture. Bone marrow signal intensity within normal limits. 7 mm lesion present within the S1 segment, with additional 8 mm lesion within the left sacral ala (series 11, images 9, 14). These lesions demonstrate hypointense precontrast T1 signal intensity, STIR hyperintensity, with postcontrast enhancement. Given history of metastatic non-small cell lung cancer, findings are concerning for small osseous metastases. No other discrete or worrisome osseous lesions. No extra osseous or epidural tumor. Conus medullaris and cauda equina: Conus extends to the L2 level.  Conus and cauda equina appear normal. No abnormal enhancement. Paraspinal and other soft tissues: Paraspinous soft tissues demonstrate no acute finding. Innumerable hepatic metastases noted within the visualized liver. Disc levels: L1-2: Normal interspace. Mild bilateral facet hypertrophy. No spinal stenosis. Foramina remain patent. L2-3: Disc desiccation with diffuse disc bulge. Superimposed right foraminal to extraforaminal disc protrusion contacts the exiting right L2 nerve root (series 9, image 17). Moderate right with mild-to-moderate left facet hypertrophy. Resultant mild canal with moderate right lateral recess stenosis. Mild to moderate right L2 foraminal narrowing. Left neural foramina remains patent. L3-4: Disc desiccation without significant disc bulge. Moderate facet and ligament flavum hypertrophy. No significant spinal stenosis. Foramina remain patent. L4-5: Disc desiccation without significant disc bulge. Moderate to severe bilateral facet arthrosis. Resultant mild canal with bilateral subarticular stenosis. Mild to moderate bilateral L4 foraminal stenosis. L5-S1: Degenerative vertebral disc space narrowing with disc desiccation and diffuse disc bulge. Reactive endplate spurring. Small central annular fissure. Mild facet hypertrophy. No significant spinal stenosis. Moderate bilateral L5 foraminal narrowing. IMPRESSION: 1. No acute abnormality within the lumbar spine. No high-grade spinal stenosis or evidence for cord compression. 2. 7 mm lesion within the S1  segment, with additional 8 mm lesion within the left sacral ala. Given history of metastatic non-small cell lung cancer, findings are concerning for small osseous metastases. No extra osseous or epidural tumor. 3. Right foraminal to extraforaminal disc protrusion at L2-3, potentially affecting the exiting right L2 nerve root. 4. Moderate to advanced lower lumbar facet hypertrophy, which could contribute to lower back pain. 5. Innumerable hepatic  metastases within the visualized liver. Electronically Signed   By: Rise Mu M.D.   On: 06/05/2023 22:43   MR Brain W Wo Contrast Result Date: 05/29/2023 CLINICAL DATA:  Metastatic disease evaluation, increased dizziness. EXAM: MRI HEAD WITHOUT AND WITH CONTRAST TECHNIQUE: Multiplanar, multiecho pulse sequences of the brain and surrounding structures were obtained without and with intravenous contrast. CONTRAST:  10mL GADAVIST GADOBUTROL 1 MMOL/ML IV SOLN COMPARISON:  10/07/2022 FINDINGS: Brain: No abnormal enhancement. No acute infarction, hemorrhage, hydrocephalus, extra-axial collection or mass lesion. Vascular: Normal flow voids. Skull and upper cervical spine: Normal marrow signal. Sinuses/Orbits: Negative. IMPRESSION: Negative for metastatic disease to the brain. Electronically Signed   By: Tiburcio Pea M.D.   On: 05/29/2023 12:37   CT CHEST ABDOMEN PELVIS WO CONTRAST Result Date: 05/24/2023 CLINICAL DATA:  Non-small-cell lung cancer. Assess treatment response. * Tracking Code: BO * EXAM: CT CHEST, ABDOMEN AND PELVIS WITHOUT CONTRAST TECHNIQUE: Multidetector CT imaging of the chest, abdomen and pelvis was performed following the standard protocol without IV contrast. RADIATION DOSE REDUCTION: This exam was performed according to the departmental dose-optimization program which includes automated exposure control, adjustment of the mA and/or kV according to patient size and/or use of iterative reconstruction technique. COMPARISON:  PET-CT scan 03/15/2023.  Standard CT 02/06/2023. FINDINGS: CT CHEST FINDINGS Cardiovascular: Evaluation significantly limited without the advantage of IV contrast. Grossly the heart is nonenlarged. No significant pericardial effusion. Coronary artery calcifications are seen. Please correlate for other coronary risk factors. The thoracic aorta has normal course and caliber with some atherosclerotic plaque. Mediastinum/Nodes: Normal caliber thoracic esophagus.  Small thyroid gland. No specific abnormal lymph node enlargement identified in the axillary regions, right hilum. Evaluation is limited without IV contrast. There are some mediastinal nodes identified once again. Example previously measured subcarinal at 18 mm and today short axis of 15 mm on series 2, image 21. Left hilar node which previously had measured 16 mm, today more difficult to measure without IV contrast but estimated at 17 mm. Series 2, image 21. No new mediastinal nodal enlargement. Lungs/Pleura: Increased small left pleural effusion. No right-sided effusion. No pneumothorax. There is fluid tracking along the left interlobar fissure as well. Numerous bilateral lung nodules are identified, diffusely distributed. The distribution appears similar to previous examination. Many of the smaller lesions appear similar in size. Specific lesions be followed for continuity. This includes the dominant focus medial at the left lung base which previously had AP dimension of 4.1 cm and today 4.1 cm on series 4, image 73. The right lower lobe central lesion which measured 12 mm on the previous examination, today on series 4, image 54 measures 12 mm. Musculoskeletal: Scattered degenerative changes of the spine. Some curvature. CT ABDOMEN PELVIS FINDINGS Hepatobiliary: Extensive liver metastases are once again identified. Again evaluation lesions is limited without the advantage of IV contrast. Specifically which will be attempted to be remeasured today for continuity. Lesion seen left lobe lateral segment measuring 2.5 cm on the previous, today is estimated at 2.4 cm on series 2, image 41. Lesion seen in segment 4 previously measuring 4.6 cm, today  is estimated at 4.9 cm on series 2, image 50. Overall liver lesions appear relatively similar in extent distribution when adjusted for technique. Subtle changes are difficult to define with the differences in technique liver has a nodular contour. Pancreas: Unremarkable. No  pancreatic ductal dilatation or surrounding inflammatory changes. Spleen: Normal in size without focal abnormality. Adrenals/Urinary Tract: Stable adrenal nodularity. No obstructing renal or ureteral stones. Nonspecific perinephric stranding. No collecting system dilatation. Preserved contour to the urinary bladder. Stomach/Bowel: On this non oral contrast exam large bowel has a normal course and caliber. Scattered colonic diverticula and stool. No small bowel dilatation. Normal appendix in the right lower quadrant. Vascular/Lymphatic: Aortic atherosclerosis. No enlarged abdominal or pelvic lymph nodes. Reproductive: Uterus is present. There is slight enlargement of the right ovary compared to left, nonspecific. Unchanged from previous. Other: Mild ascites identified today comment new from previous. This includes along the margin of the liver and in the dependent pelvis. Increasing mesenteric stranding Musculoskeletal: Moderate degenerative changes of the spine and pelvis. IMPRESSION: Evaluation is limited without IV contrast. Specific lesions are difficult to measure and directly compared to the previous contrast examinations. 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 There is also stable mediastinal and left hilar nodes overall. Difficulty specifically true liver lesions. If needed additional workup may be of some benefit of the liver including potentially MRI if CT is not an option with contrast However increasing small left pleural effusion. Developing mild ascites. Please correlate for any clinical evidence of pseudo cirrhosis with nodular contour to the liver as well. Electronically Signed   By: Karen Kays M.D.   On: 05/24/2023 16:25    Subjective: - no chest pain, shortness of breath, no abdominal pain, nausea or vomiting.   Discharge Exam: BP 138/79 (BP Location: Left Arm)   Pulse 73   Temp 97.8 F (36.6 C)   Resp 18   Ht 5\' 2"  (1.575 m)    Wt 128.6 kg   LMP 05/02/2017   SpO2 100%   BMI 51.85 kg/m   General: Pt is alert, awake, not in acute distress Cardiovascular: RRR, S1/S2 +, no rubs, no gallops Respiratory: CTA bilaterally, no wheezing, no rhonchi Abdominal: Soft, NT, ND, bowel sounds + Extremities: no edema, no cyanosis    The results of significant diagnostics from this hospitalization (including imaging, microbiology, ancillary and laboratory) are listed below for reference.     Microbiology: Recent Results (from the past 240 hours)  Culture, blood (Routine X 2) w Reflex to ID Panel     Status: None   Collection Time: 06/06/23  8:55 AM   Specimen: BLOOD LEFT WRIST  Result Value Ref Range Status   Specimen Description   Final    BLOOD LEFT WRIST Performed at [facility], [address], [place], [place] [address]    Special Requests   Final    BOTTLES DRAWN AEROBIC AND ANAEROBIC Blood Culture results may not be optimal due to an inadequate volume of blood received in culture bottles Performed at [facility], [address], [place], [place] [address]    Culture   Final    NO GROWTH 6 DAYS Performed at [facility], [address], [place], [place] [address]    Report Status 06/12/2023 FINAL  Final  Culture, blood (Routine X 2) w Reflex to ID Panel     Status: None   Collection Time: 06/06/23  8:58 AM  Specimen: BLOOD RIGHT WRIST  Result Value Ref Range Status   Specimen Description   Final    BLOOD RIGHT WRIST Performed at [facility], [address] [place], [place] [address]    Special Requests   Final    BOTTLES DRAWN AEROBIC AND ANAEROBIC Blood Culture results may not be optimal due to an inadequate volume of blood received in culture bottles Performed at [facility], [address] [place], [place] [address]    Culture   Final    NO GROWTH 6 DAYS Performed at [facility], [address] [place], [place] [address]     Report Status 06/12/2023 FINAL  Final  MRSA Next Gen by PCR, Nasal     Status: None   Collection Time: 06/06/23  5:39 PM   Specimen: Nasal Mucosa; Nasal Swab  Result Value Ref Range Status   MRSA by PCR Next Gen NOT DETECTED NOT DETECTED Final    Comment: (NOTE) The GeneXpert MRSA Assay (FDA approved for NASAL specimens only), is one component of a comprehensive MRSA colonization surveillance program. It is not intended to diagnose MRSA infection nor to guide or monitor treatment for MRSA infections. Test performance is not FDA approved in patients less than 10 years old. Performed at [facility], [address] [place], [place] [address]   Body fluid culture w Gram Stain     Status: None   Collection Time: 06/08/23  3:01 PM   Specimen: PATH Cytology Pleural fluid  Result Value Ref Range Status   Specimen Description   Final    PLEURAL LEFT SIDE Performed at [facility], [address] [place], [place] [address]    Special Requests   Final    NONE Performed at [facility], [address], [place], [place] [address]    Gram Stain NO WBC SEEN NO ORGANISMS SEEN   Final   Culture   Final    NO GROWTH 3 DAYS Performed at [facility], [address], [place], [place] [address]    Report Status 06/12/2023 FINAL  Final     Labs: Basic Metabolic Panel: Recent Labs  Lab 06/10/23 0651 06/12/23 0557 06/13/23 0609 06/14/23 0553 06/15/23 0541  NA 145 141 143 143 139  K 4.3 4.3 4.5 4.3 4.3  CL 103 99 99 99 97*  CO2 35* 36* 35* 34* 35*  GLUCOSE 118* 108* 120* 126* 129*  BUN 34* 37* 38* 36* 41*  CREATININE 1.50* 1.51* 1.65* 1.45* 1.72*  CALCIUM 10.7* 10.3 10.5* 10.5* 10.3   Liver Function Tests: No results for input(s): "AST", "ALT", "ALKPHOS", "BILITOT", "PROT", "ALBUMIN" in the last 168 hours. CBC: Recent Labs  Lab 06/10/23 0651 06/11/23 0839 06/12/23 0554 06/13/23 0609 06/14/23 0553 06/15/23 0541   WBC 4.7 5.9 6.0 6.0 6.7 7.7  NEUTROABS 2.8 4.4  --  4.6 5.2 5.9  HGB 8.2* 8.6* 7.7* 7.7* 8.2* 8.1*  HCT 26.4* 28.3* 24.6* 25.3* 27.2* 25.2*  MCV 110.5* 110.1* 110.3* 111.0* 111.5* 108.6*  PLT 11* 14* 32* 34* 41* 59*   CBG: No results for input(s): "GLUCAP" in the last 168 hours. Hgb A1c No results for input(s): "HGBA1C" in the last 72 hours. Lipid Profile No results for input(s): "CHOL", "HDL", "LDLCALC", "TRIG", "CHOLHDL", "LDLDIRECT" in the last 72 hours. Thyroid function studies No results for input(s): "TSH", "T4TOTAL", "T3FREE", "THYROIDAB" in the last 72 hours.  Invalid input(s): "FREET3" Urinalysis  Component Value Date/Time   COLORURINE YELLOW 06/06/2023 1733   APPEARANCEUR CLEAR 06/06/2023 1733   APPEARANCEUR Clear 03/08/2023 0000   LABSPEC 1.011 06/06/2023 1733   PHURINE 6.0 06/06/2023 1733   GLUCOSEU NEGATIVE 06/06/2023 1733   HGBUR SMALL (A) 06/06/2023 1733   BILIRUBINUR NEGATIVE 06/06/2023 1733   BILIRUBINUR Negative 03/08/2023 0000   KETONESUR NEGATIVE 06/06/2023 1733   PROTEINUR NEGATIVE 06/06/2023 1733   UROBILINOGEN 1.0 05/21/2018 1332   NITRITE NEGATIVE 06/06/2023 1733   LEUKOCYTESUR NEGATIVE 06/06/2023 1733    FURTHER DISCHARGE INSTRUCTIONS:   Get Medicines reviewed and adjusted: Please take all your medications with you for your next visit with your Primary MD   Laboratory/radiological data: Please request your Primary MD to go over all hospital tests and procedure/radiological results at the follow up, please ask your Primary MD to get all Hospital records sent to his/her office.   In some cases, they will be blood work, cultures and biopsy results pending at the time of your discharge. Please request that your primary care M.D. goes through all the records of your hospital data and follows up on these results.   Also Note the following: If you experience worsening of your admission symptoms, develop shortness of breath, life threatening  emergency, suicidal or homicidal thoughts you must seek medical attention immediately by calling 911 or calling your MD immediately  if symptoms less severe.   You must read complete instructions/literature along with all the possible adverse reactions/side effects for all the Medicines you take and that have been prescribed to you. Take any new Medicines after you have completely understood and accpet all the possible adverse reactions/side effects.    Do not drive when taking Pain medications or sleeping medications (Benzodaizepines)   Do not take more than prescribed Pain, Sleep and Anxiety Medications. It is not advisable to combine anxiety,sleep and pain medications without talking with your primary care practitioner   Special Instructions: If you have smoked or chewed Tobacco  in the last 2 yrs please stop smoking, stop any regular Alcohol  and or any Recreational drug use.   Wear Seat belts while driving.   Please note: You were cared for by a hospitalist during your hospital stay. Once you are discharged, your primary care physician will handle any further medical issues. Please note that NO REFILLS for any discharge medications will be authorized once you are discharged, as it is imperative that you return to your primary care physician (or establish a relationship with a primary care physician if you do not have one) for your post hospital discharge needs so that they can reassess your need for medications and monitor your lab values.  Time coordinating discharge: 35 minutes  SIGNED:  Pamella Pert, MD, PhD 06/15/2023, 8:52 AM

## 2023-06-16 NOTE — Significant Event (Signed)
Nurse tech was helping pt from chair to bed and pt sat down on the floor instead of standing up as instructed. Pt was then unable to stand up. Nurse was informed and got the [facility] lift. Placed pt in [place] straps and lifted safely to the bed. No injuries sustained.

## 2023-06-19 ENCOUNTER — Inpatient Hospital Stay: Payer: BC Managed Care – PPO

## 2023-06-19 ENCOUNTER — Encounter: Payer: Self-pay | Admitting: Internal Medicine

## 2023-06-19 ENCOUNTER — Other Ambulatory Visit: Payer: Self-pay | Admitting: Physician Assistant

## 2023-06-19 ENCOUNTER — Telehealth: Payer: Self-pay | Admitting: Medical Oncology

## 2023-06-19 ENCOUNTER — Inpatient Hospital Stay: Payer: BC Managed Care – PPO | Admitting: Internal Medicine

## 2023-06-19 DIAGNOSIS — C3492 Malignant neoplasm of unspecified part of left bronchus or lung: Secondary | ICD-10-CM

## 2023-06-19 NOTE — Telephone Encounter (Signed)
No show  appt today .Resides at [facility] for rehab.    Schedule message sent to r/s with [facility].

## 2023-06-20 ENCOUNTER — Encounter: Payer: Self-pay | Admitting: Internal Medicine

## 2023-06-21 ENCOUNTER — Telehealth: Payer: Self-pay | Admitting: Internal Medicine

## 2023-06-22 ENCOUNTER — Other Ambulatory Visit: Payer: Self-pay

## 2023-06-22 ENCOUNTER — Other Ambulatory Visit: Payer: Self-pay | Admitting: Cardiology

## 2023-06-22 DIAGNOSIS — I152 Hypertension secondary to endocrine disorders: Secondary | ICD-10-CM

## 2023-06-23 NOTE — Progress Notes (Signed)
 Updated pemetrexed ERX to [id number]   Pryor Ochoa, PharmD 06/23/23

## 2023-06-24 NOTE — Progress Notes (Unsigned)
 [facility] OFFICE PROGRESS NOTE  [name], [name], NP [address] [place] [address]  DIAGNOSIS: Stage IV non-small cell lung cancer, adenocarcinoma (T2a, N3, M1c).  She presented with bilateral lung nodules, thoracic adenopathy and bilobar liver lesions.  She was diagnosed in May 2024.     PDL1: 0   Guardant 360: Negative for any actionable mutations   Foundation One: No actionable mutations   PRIOR THERAPY: None  CURRENT THERAPY: Palliative systemic chemotherapy and immunotherapy with carboplatin for an AUC of 5, alimta 500 mg/m2, and Keytruda 200 mg IV every 3 weeks. First dose on 10/10/22. Status post 12 cycles. Starting from cycle #5 the patient is on maintenance treatment with Alimta and Keytruda every 3 weeks. Her dose of Alimta was held today (1/8) due to elevated creatinine. Her dose of alimta was reduced to 400 mg/m2 starting from cycle #12 due to intolerance. Starting from cycle #13, she will continue on single agent immunotherapy with Keytruda due to intolerance with chemotherapy due to cytopenias.    INTERVAL HISTORY: Madison Palmer 58 y.o. female returns to the clinic today for a follow-up visit accompanied by her son.  In summary, the patient has stage IV lung cancer.  Unfortunately, she does not have any actionable mutations.  Therefore, she is on systemic chemotherapy and immunotherapy.  With the more cumulative doses of treatment, she has been having increasing pancytopenia. Her dose of chemotherapy was reduced at her last appointment but she continued to have worsening cytopenias. She was recently admitted to the hospital from 2//25-2/14/25 secondary to weakness.  She had a brain MRI a few weeks prior to her admission which did not show any evidence of metastatic disease to the brain. While admitted she had a MRI of the lumbar spine which showed a 2.7 mm lesion at S1 and an additional 8 mm lesion in the left sacral alla concerning for small  osseous mets but she did have moderate advanced lower lumbar facet hypertrophy which could contribute to her low back pain for which she is currently on gabapentin.  She was evaluated by neurology.  She has a right foraminal and extraforaminal disc protrusion at L2 and 3 potentially affecting the right L2 nerve.  Neurosurgery did not feel that she was requiring any intervention and recommended physical therapy.  She was discharged to a skilled nursing facility for rehab.   She also had pancytopenia during her hospitalization for which she received 4 units of platelets and Granix.  She is also treated for possible panniculitis in the pubic area with IV antibiotics.   She is planning on staying at [facility] rehab until 3/14.  She also follows up outpatient with psychiatry for bipolar depression.  However, she does not have any follow-up appointments at this time. She was noted in the hospital to having severe anxiety with associated jittering. She seemed anxious today.   She continues to have waxing and waning fatigue.  The patient's son also mentions some cognitive changes.  She denies any fevers.  Denies any significant breathing changes.  Denies any significant nausea or vomiting, diarrhea, or constipation.  She has some nosebleeding secondary to wearing supplemental oxygen at this time.  She is doing physical therapy a few times a week. She continues to have hyperpigmentation and dry skin on her lower extremities, likely due to her chemotherapy with Alimta.  She is here today for evaluation repeat blood work before considering undergoing cycle #13.   MEDICAL HISTORY: Past Medical  History:  Diagnosis Date   Acute on chronic respiratory failure with hypoxia and hypercapnia (HCC) 09/15/2018   Anemia    Anxiety    Arrhythmia    tachycardia   Arthritis    Bipolar 1 disorder (HCC) 09/12/2018   Bipolar I disorder, current or most recent episode manic, with psychotic features (HCC)    Brief psychotic  disorder (HCC) 08/22/2018   Chronic respiratory failure with hypoxia (HCC) 05/10/2018   Formatting of this note might be different from the original. Last Assessment & Plan:  Continue 1 L continuous on exertion and during sleep. On her next visit we will check OSA on CPAP/room air to decide whether she needs to take oxygen along with her on her cruise in May   Depression    Diverticulitis    Generalized edema 03/12/2018   GERD (gastroesophageal reflux disease)    Glaucoma    Hyperlipidemia    Hyperparathyroidism (HCC)    Hypertension    Inflammatory polyps of colon (HCC)    Leg swelling 01/14/2019   LVH (left ventricular hypertrophy)    Lymphedema    Morbid (severe) obesity due to excess calories (HCC) 05/11/2017   PCOS (polycystic ovarian syndrome)    Peripheral vascular disease (HCC)    Prediabetes    Recurrent UTI    Sleep apnea    CPAP   Thyroid disease    Vitamin D deficiency     ALLERGIES:  is allergic to other, fentanyl, levofloxacin, midazolam, pollen extract, atorvastatin, hydralazine hcl, and rosuvastatin.  MEDICATIONS:  Current Outpatient Medications  Medication Sig Dispense Refill   acetaminophen (TYLENOL) 500 MG tablet Take 1,000 mg by mouth 3 (three) times daily as needed for moderate pain or headache.     amLODipine (NORVASC) 10 MG tablet TAKE 1 TABLET BY MOUTH EVERY DAY 90 tablet 3   atorvastatin (LIPITOR) 20 MG tablet TAKE 1 TABLET(20 MG) BY MOUTH 3 TIMES A WEEK 12 tablet 2   benztropine (COGENTIN) 0.5 MG tablet Take 1 tablet (0.5 mg total) by mouth at bedtime. 30 tablet 2   cetirizine (ZYRTEC) 10 MG tablet TAKE 1 TABLET BY MOUTH EVERY DAY (Patient not taking: Reported on 06/06/2023) 90 tablet 1   Cholecalciferol (VITAMIN D3 MAXIMUM STRENGTH) 125 MCG (5000 UT) capsule Take 5,000 Units by mouth daily.     esomeprazole (NEXIUM) 40 MG capsule TAKE 1 CAPSULE BY MOUTH TWICE DAILY 180 capsule 2   fenofibrate (TRICOR) 145 MG tablet TAKE 1 TABLET(145 MG) BY MOUTH DAILY 90  tablet 3   folic acid (FOLVITE) 1 MG tablet TAKE 1 TABLET BY MOUTH DAILY 30 tablet 2   furosemide (LASIX) 40 MG tablet TAKE 1 TABLET BY MOUTH EVERY DAY 90 tablet 3   gabapentin (NEURONTIN) 100 MG capsule TAKE 2 CAPSULES(200 MG) BY MOUTH AT BEDTIME 180 capsule 3   labetalol (NORMODYNE) 300 MG tablet TAKE 1 AND 1/2 TABLETS BY MOUTH TWICE DAILY 45 tablet 0   levothyroxine (SYNTHROID) 88 MCG tablet Take 88 mcg by mouth daily.     lurasidone (LATUDA) 40 MG TABS tablet Take 1 tablet (40 mg total) by mouth daily with breakfast. 30 tablet 2   nystatin (MYCOSTATIN/NYSTOP) powder Apply topically 3 (three) times daily.     Polyethylene Glycol 3350 (MIRALAX PO) Take 1 Capful by mouth daily as needed (Constipation).     prochlorperazine (COMPAZINE) 10 MG tablet Take 1 tablet (10 mg total) by mouth every 6 (six) hours as needed. 30 tablet 2   TART CHERRY  PO Take 1 tablet by mouth daily.     No current facility-administered medications for this visit.    SURGICAL HISTORY:  Past Surgical History:  Procedure Laterality Date   BIOPSY  09/27/2022   Procedure: BIOPSY;  Surgeon: Mansouraty, Netty Starring., MD;  Location: [facility] ENDOSCOPY;  Service: Gastroenterology;;   BREAST BIOPSY  2015   CESAREAN SECTION  2004   CHOLECYSTECTOMY     COLONOSCOPY WITH PROPOFOL N/A 09/27/2022   Procedure: COLONOSCOPY WITH PROPOFOL;  Surgeon: Lemar Lofty., MD;  Location: [facility] ENDOSCOPY;  Service: Gastroenterology;  Laterality: N/A;   ESOPHAGOGASTRODUODENOSCOPY (EGD) WITH PROPOFOL N/A 09/27/2022   Procedure: ESOPHAGOGASTRODUODENOSCOPY (EGD) WITH PROPOFOL;  Surgeon: Meridee Score Netty Starring., MD;  Location: [facility] ENDOSCOPY;  Service: Gastroenterology;  Laterality: N/A;   INNER EAR SURGERY     ear and sinus surgery   LAPAROSCOPIC REPAIR AND REMOVAL OF GASTRIC BAND     MALONEY DILATION  09/27/2022   Procedure: MALONEY DILATION;  Surgeon: Lemar Lofty., MD;  Location: [facility] ENDOSCOPY;  Service: Gastroenterology;;   OOPHORECTOMY  Left    POLYPECTOMY  09/27/2022   Procedure: POLYPECTOMY;  Surgeon: Lemar Lofty., MD;  Location: [facility] ENDOSCOPY;  Service: Gastroenterology;;   TONSILLECTOMY AND ADENOIDECTOMY      REVIEW OF SYSTEMS:   Constitutional: Positive for fatigue. Negative for appetite change, chills,  fever and unexpected weight change.  HENT: Negative for mouth sores, nosebleeds, sore throat and trouble swallowing.   Eyes: Negative for eye problems and icterus.  Respiratory: Negative for cough, hemoptysis, shortness of breath and wheezing.   Cardiovascular: Negative for chest pain and leg swelling.  Gastrointestinal: Negative for abdominal pain, constipation, diarrhea, nausea and vomiting.  Genitourinary: Negative for bladder incontinence, difficulty urinating, dysuria, frequency and hematuria.   Musculoskeletal: Positive for lower extremity weakness.  Negative for back pain, gait problem, neck pain and neck stiffness.  Skin: Positive for dry skin and hyperpigmentation on her right lower extremity.   Neurological: Positive for anxiousness.  Negative for dizziness, extremity headaches, and seizures.  Hematological: Negative for adenopathy. Does not bruise/bleed easily.  Psychiatric/Behavioral: Positive for anxiety/jittery. Negative for confusion, depression and sleep disturbance.     PHYSICAL EXAMINATION:  Last menstrual period 05/02/2017.  ECOG PERFORMANCE STATUS: 3  Physical Exam  Constitutional: Oriented to person, place, and time and well-developed, well-nourished, and in no distress.  HENT:  Head: Normocephalic and atraumatic.  Mouth/Throat: Oropharynx is clear and moist. No oropharyngeal exudate.  Eyes: Conjunctivae are normal. Right eye exhibits no discharge. Left eye exhibits no discharge. No scleral icterus.  Neck: Normal range of motion. Neck supple.  Cardiovascular: Normal rate, regular rhythm, normal heart sounds and intact distal pulses.   Pulmonary/Chest: Effort normal and breath  sounds normal. No respiratory distress. No wheezes. No rales.  Abdominal: Soft. Bowel sounds are normal. Exhibits no distension and no mass. There is no tenderness.  Musculoskeletal: Normal range of motion. Exhibits no edema.  Lymphadenopathy:    No cervical adenopathy.  Neurological: Alert and oriented to person, place, and time. Exhibits normal muscle tone. Gait normal. Coordination normal.  Skin: Skin is warm and dry. Positive for hyperpigmentation and dry cracked skin on right lower extremity. No warmth, swelling, or erythema. Not diaphoretic.  No pallor.  Psychiatric: Mood, memory and judgment normal.  Vitals reviewed.  LABORATORY DATA: Lab Results  Component Value Date   WBC 7.7 06/15/2023   HGB 8.1 (L) 06/15/2023   HCT 25.2 (L) 06/15/2023   MCV 108.6 (H) 06/15/2023  PLT 59 (L) 06/15/2023      Chemistry      Component Value Date/Time   NA 139 06/15/2023 0541   NA 145 09/08/2021 0000   K 4.3 06/15/2023 0541   CL 97 (L) 06/15/2023 0541   CO2 35 (H) 06/15/2023 0541   BUN 41 (H) 06/15/2023 0541   BUN 36 (H) 08/22/2021 1606   CREATININE 1.72 (H) 06/15/2023 0541   CREATININE 1.19 (H) 05/29/2023 1038   CREATININE 1.13 (H) 12/24/2018 1402   GLU 97 09/08/2021 0000      Component Value Date/Time   CALCIUM 10.3 06/15/2023 0541   ALKPHOS 58 06/07/2023 0953   AST 26 06/07/2023 0953   AST 24 05/29/2023 1038   ALT 26 06/07/2023 0953   ALT 13 05/29/2023 1038   BILITOT 1.3 (H) 06/07/2023 0953   BILITOT 0.7 05/29/2023 1038       RADIOGRAPHIC STUDIES:  DG Chest Port 1 View Result Date: 06/08/2023 CLINICAL DATA:  [id number] S/P left thoracentesis [id number] EXAM: PORTABLE CHEST 1 VIEW COMPARISON:  06/07/2023 chest radiograph. FINDINGS: Stable cardiomediastinal silhouette with mild cardiomegaly. No pneumothorax. Trace residual left pleural effusion, decreased. No right pleural effusion. Cephalization of the pulmonary vasculature without overt pulmonary edema. Left retrocardiac and streaky  left lung base opacity, decreased. IMPRESSION: 1. No pneumothorax. Trace residual left pleural effusion, decreased. 2. Left retrocardiac and streaky left lung base opacity, decreased, favor atelectasis. 3. Mild cardiomegaly without overt pulmonary edema. Electronically Signed   By: Delbert Phenix M.D.   On: 06/08/2023 16:55   US THORACENTESIS ASP PLEURAL SPACE W/IMG GUIDE Result Date: 06/08/2023 INDICATION: 58 year old female with non-small cell lung adenocarcinoma, now with left pleural effusion. Request for diagnostic and therapeutic thoracentesis. EXAM: ULTRASOUND GUIDED LEFT THORACENTESIS MEDICATIONS: 10 mL 1% lidocaine. COMPLICATIONS: None immediate. PROCEDURE: An ultrasound guided thoracentesis was thoroughly discussed with the patient and questions answered. The benefits, risks, alternatives and complications were also discussed. The patient understands and wishes to proceed with the procedure. Written consent was obtained. Ultrasound was performed to localize and mark an adequate pocket of fluid in the left chest. The area was then prepped and draped in the normal sterile fashion. 1% Lidocaine was used for local anesthesia. Under ultrasound guidance a 6 Fr Safe-T-Centesis catheter was introduced. Thoracentesis was performed. The catheter was removed and a dressing applied. FINDINGS: A total of approximately 400 mL of yellow fluid was removed. Samples were sent to the laboratory as requested by the clinical team. IMPRESSION: Successful ultrasound guided left thoracentesis yielding 400 mL of pleural fluid. Performed by: Loyce Dys PA-C Electronically Signed   By: Corlis Leak M.D.   On: 06/08/2023 15:38   DG CHEST PORT 1 VIEW Result Date: 06/07/2023 CLINICAL DATA:  Shortness of breath and weakness. EXAM: PORTABLE CHEST 1 VIEW COMPARISON:  Chest CT dated 06/06/2023. FINDINGS: Small left pleural effusion and left lung base atelectasis or infiltrate. The right lung is clear. No pneumothorax. Stable cardiac  silhouette. No acute osseous pathology. IMPRESSION: Small left pleural effusion and left lung base atelectasis or infiltrate. A Electronically Signed   By: Elgie Collard M.D.   On: 06/07/2023 15:06   MR BRAIN WO CONTRAST Result Date: 06/07/2023 CLINICAL DATA:  Neuro deficit, acute, stroke suspected. EXAM: MRI HEAD WITHOUT CONTRAST TECHNIQUE: Multiplanar, multiecho pulse sequences of the brain and surrounding structures were obtained without intravenous contrast. COMPARISON:  MRI of the brain May 22, 2023. FINDINGS: Incomplete study due to patient inability to lie flat in the  scanner for the duration of the study. Images obtained are partially degraded by motion. Brain: No acute infarction, hemorrhage, hydrocephalus, extra-axial collection or mass lesion. Vascular: Normal flow voids. Skull and upper cervical spine: Decreased T1 signal of calvarium, likely related to history of anemia. Sinuses/Orbits: Mild mucosal thickening in on the right maxillary sinus. The orbits are grossly unremarkable. Other: None. IMPRESSION: 1. Incomplete study. Images obtained are partially degraded by motion. 2. No acute intracranial abnormality identified. Electronically Signed   By: Baldemar Lenis M.D.   On: 06/07/2023 10:08   CT Angio Chest PE W and/or Wo Contrast Result Date: 06/06/2023 CLINICAL DATA:  Increasing weakness following chemotherapy last week. History of non-small cell lung cancer, chronic respiratory distress. Portable chest obtained prior to CT. EXAM: CT ANGIOGRAPHY CHEST WITH CONTRAST TECHNIQUE: Multidetector CT imaging of the chest was performed using the standard protocol during bolus administration of intravenous contrast. Multiplanar CT image reconstructions and MIPs were obtained to evaluate the vascular anatomy. RADIATION DOSE REDUCTION: This exam was performed according to the departmental dose-optimization program which includes automated exposure control, adjustment of the mA and/or kV  according to patient size and/or use of iterative reconstruction technique. CONTRAST:  OMNIPAQUE IOHEXOL 350 MG/ML SOLN COMPARISON:  Chest CT with contrast 05/22/2023, PET-CT 03/15/2023. FINDINGS: Cardiovascular: There is moderate left main and three-vessel coronary artery calcific plaque. Mild cardiomegaly with a left chamber predominance and mild aortic atherosclerosis. There is no aortic or great vessel aneurysm, dissection or stenosis. The pulmonary arteries and veins are normal in caliber without evidence for arterial embolic disease. There is no pericardial effusion. Mediastinum/Nodes: No new adenopathy. The subcarinal lymph node measuring 1.6 cm in short axis was previously 1.5 cm. A left hilar lymph node is again noted measuring 1.4 cm in short axis on 4:59, previously 1.4 cm. There is a stable borderline prominent low right paratracheal space lymph node. Axillary spaces are clear. The trachea, main bronchi, thoracic esophagus are unremarkable. Lungs/Pleura: There is increasing now moderate sized left pleural effusion, some of which appears to be at least partially loculated in the anterior lower chest. There is no right-sided effusion. Some of the left pleural fluid tracks into the inferior interlobar fissure, more than previously. There are numerous diffuse metastatic lung nodules bilaterally. Again, the largest is in the medial left lower lobe base measuring 4.9 cm on 12:82, previously 4.1 cm when measured in similar fashion. Index right lower lobe perihilar lesion on 12:65 is stable in size at 12 mm, masses another right lower lobe nodule measuring 1.7 cm on 12:82. Others are much smaller. Rest of the lungs exhibit mosaicism consistent with air trapping and small airways disease, without appreciable focal pneumonia. Upper Abdomen: Diffuse innumerable hepatic metastases were better demonstrated on prior CT. In the phase of contrast in which the liver was imaged today it is difficult to distinguish  individual metastases. Left adrenal nodule is stable. Minimal perihepatic ascites with no acute upper abdominal findings. Musculoskeletal: Bridging enthesopathy multiple thoracic spine levels. No destructive bone lesions in the thorax. Review of the MIP images confirms the above findings. IMPRESSION: 1. No evidence of arterial embolic disease. 2. Increasing now moderate sized left pleural effusion, some of which appears to be at least partially loculated in the anterior lower chest. 3. Numerous diffuse metastatic lung nodules bilaterally, the largest in the medial left lower lobe base measuring 4.9 cm, previously 4.1 cm. 4. Stable mediastinal and left hilar adenopathy. 5. Diffuse innumerable hepatic metastases were better demonstrated  on prior CT. In the phase of contrast in which the liver was imaged today it is difficult to distinguish individual metastases. 6. Stable left adrenal nodule. 7. Aortic and coronary artery atherosclerosis. Mild cardiomegaly. 8. Mosaic lung attenuation consistent with air trapping and small airways disease. Aortic Atherosclerosis (ICD10-I70.0). Electronically Signed   By: Almira Bar M.D.   On: 06/06/2023 04:14   DG Chest Portable 1 View Result Date: 06/06/2023 CLINICAL DATA:  Shortness of breath. History of lung cancer with chemotherapy every 3 weeks. Recent chemotherapy last week. Increasing weakness over the last few days. EXAM: PORTABLE CHEST 1 VIEW COMPARISON:  CT chest abdomen and pelvis 05/22/2023. FINDINGS: Shallow inspiration. Mild cardiac enlargement. Infiltration or atelectasis in the left lung base with small left pleural effusion. This could be edema or pneumonia. Fine nodular changes in the lungs are better seen on prior CT. No pneumothorax. Degenerative changes in the spine. IMPRESSION: Cardiac enlargement. Small left pleural effusion with basilar atelectasis or infiltration. Known pulmonary nodules are better seen on prior CT. Electronically Signed   By: Burman Nieves M.D.   On: 06/06/2023 00:42   MR Lumbar Spine W Wo Contrast Result Date: 06/05/2023 CLINICAL DATA:  Initial evaluation for acute myelopathy. History of metastatic non-small cell lung cancer. EXAM: MRI LUMBAR SPINE WITHOUT AND WITH CONTRAST TECHNIQUE: Multiplanar and multiecho pulse sequences of the lumbar spine were obtained without and with intravenous contrast. CONTRAST:  10mL GADAVIST GADOBUTROL 1 MMOL/ML IV SOLN COMPARISON:  Comparison made with prior CT from 05/22/2023 and PET-CT from 03/15/2023 FINDINGS: Segmentation: Standard. Lowest well-formed disc space labeled the L5-S1 level. Alignment: 3 mm degenerative retrolisthesis of L5 on S1, with trace 2 mm anterolisthesis of L4 on L5. Findings chronic and facet mediated. Alignment otherwise normal preservation of the normal lumbar lordosis. Vertebrae: Vertebral body height maintained without acute or chronic fracture. Bone marrow signal intensity within normal limits. 7 mm lesion present within the S1 segment, with additional 8 mm lesion within the left sacral ala (series 11, images 9, 14). These lesions demonstrate hypointense precontrast T1 signal intensity, STIR hyperintensity, with postcontrast enhancement. Given history of metastatic non-small cell lung cancer, findings are concerning for small osseous metastases. No other discrete or worrisome osseous lesions. No extra osseous or epidural tumor. Conus medullaris and cauda equina: Conus extends to the L2 level. Conus and cauda equina appear normal. No abnormal enhancement. Paraspinal and other soft tissues: Paraspinous soft tissues demonstrate no acute finding. Innumerable hepatic metastases noted within the visualized liver. Disc levels: L1-2: Normal interspace. Mild bilateral facet hypertrophy. No spinal stenosis. Foramina remain patent. L2-3: Disc desiccation with diffuse disc bulge. Superimposed right foraminal to extraforaminal disc protrusion contacts the exiting right L2 nerve root (series  9, image 17). Moderate right with mild-to-moderate left facet hypertrophy. Resultant mild canal with moderate right lateral recess stenosis. Mild to moderate right L2 foraminal narrowing. Left neural foramina remains patent. L3-4: Disc desiccation without significant disc bulge. Moderate facet and ligament flavum hypertrophy. No significant spinal stenosis. Foramina remain patent. L4-5: Disc desiccation without significant disc bulge. Moderate to severe bilateral facet arthrosis. Resultant mild canal with bilateral subarticular stenosis. Mild to moderate bilateral L4 foraminal stenosis. L5-S1: Degenerative vertebral disc space narrowing with disc desiccation and diffuse disc bulge. Reactive endplate spurring. Small central annular fissure. Mild facet hypertrophy. No significant spinal stenosis. Moderate bilateral L5 foraminal narrowing. IMPRESSION: 1. No acute abnormality within the lumbar spine. No high-grade spinal stenosis or evidence for cord compression. 2. 7 mm lesion  within the S1 segment, with additional 8 mm lesion within the left sacral ala. Given history of metastatic non-small cell lung cancer, findings are concerning for small osseous metastases. No extra osseous or epidural tumor. 3. Right foraminal to extraforaminal disc protrusion at L2-3, potentially affecting the exiting right L2 nerve root. 4. Moderate to advanced lower lumbar facet hypertrophy, which could contribute to lower back pain. 5. Innumerable hepatic metastases within the visualized liver. Electronically Signed   By: Rise Mu M.D.   On: 06/05/2023 22:43     ASSESSMENT/PLAN:  This is a very pleasant 58 year old African-American female referred to clinic for what is felt to be stage IV non-small cell lung cancer, adenocarcinoma (T2a, N3, M1c). She presented with bilateral lung nodules, thoracic adenopathy and bilobar liver lesions. She was diagnosed in May 2024. Her PD-L1 expression 0%. She does not have any actionable  mutations by Guardant360. Her foundation 1 molecular testing is negative.    The patient is currently undergoing systemic chemoimmunotherapy with carboplatin for AUC 5, Alimta 500 Mg/M2 and Keytruda 200 Mg IV every 3 weeks status post 12 cycles.  Starting from cycle #5 she is on maintenance treatment with Alimta and Keytruda every 3 weeks.Starting from cycle #12, her alimta dose was reduced to 400 mg/m2. Starting from cycle #13 we will remove the chemotherapy (Alimta) to intolerance to chemotherapy from cytopenias  The patient was recently hospitalized for weakness.   The patient was seen with Dr. Arbutus Ped today. Labs were reviewed.  Her white blood cell count, platelet count, and hemoglobin is improving compared to her hospitalization.  She is not a candidate for chemotherapy at this time due to cytopenias.  Therefore, we will proceed with single agent immunotherapy with Keytruda as this should not cause cytopenias.  Her potassium is slightly low.  I printed out a prescription for potassium to take 1 tablet p.o. daily for 6 days.  I gave them the number to psychiatry to make a follow up appointment ASAP.  The patient continues to be anxious appearing.  Per discharge instruction she supposed to follow-up within 1 to 2 weeks of discharge with psychiatry. I have discontinued her compazine from her care plan should they need to place her on additional anti-psychotic medications to avoid any possible TD.    He is currently at rehab for physical therapy for her weakness.  Neurology evaluated her inpatient and she is not a candidate for any surgical intervention.  For her epistaxis this is likely secondary to her supplemental oxygen and she was advised to use saline spray and humidifier.  To avoid skin breakdown and pressure ulcers I would recommend that she change position at least every 2 hours.   Per hospital notes, she had some hospital induced delirium. Her son states she continues to be in and out  of confusion. We let her son know should she not have any improvement, would have low threshold for emergent evaluation. She had a brain MRI last month which did not show any metastatic disease to the brain. I gave the patient's son the number to her psychiatrist to schedule follow up as soon as possible.    The patient was advised to call immediately if she has any concerning symptoms in the interval. The patient voices understanding of current disease status and treatment options and is in agreement with the current care plan. All questions were answered. The patient knows to call the clinic with any problems, questions or concerns. We can certainly see the patient  much sooner if necessary No orders of the defined types were placed in this encounter.    Kayslee Furey L Quintella Mura, PA-C 06/24/23  ADDENDUM: Hematology/Oncology Attending:  I had a face-to-face encounter with the patient today.  I reviewed her records, lab, scan and recommended her care plan.  This is a very pleasant 58 years old African-American female diagnosed with stage IV non-small cell lung cancer, adenocarcinoma in May 2024 with no actionable mutations and negative PD-L1 expression.  The patient started palliative systemic chemotherapy with carboplatin, Alimta and Keytruda for 4 cycles and has been on maintenance treatment with Alimta and Keytruda every 3 weeks status post total of 12 cycles.  She was recently admitted to the hospital with significant fatigue and weakness as well as inability to move her legs.  She had significant pancytopenia during her hospitalization requiring several PRBCs transfusion as well as platelet transfusion.  She also received treatment with Granix for the chemotherapy-induced neutropenia.  She was treated with a course of antibiotic for panniculitis on the pubic area. She is currently on a rehab facility for physical therapy.  The patient also has a long history of psychiatric issues with bipolar  depression.  She was so weak and tired today but she is interested in proceeding with her treatment as planned.  I explained to the patient that it will be risky to give her treatment with chemotherapy at this point but I am willing to proceed with just immunotherapy with Keytruda at least for few cycles until improvement of her condition. She had CT angiogram of the chest during her hospitalization that showed no clear evidence for disease progression except for mild pulmonary nodules. The patient will come back for follow-up visit in 3 weeks for evaluation before the next cycle of her treatment. She was advised to call immediately if she has any other concerning symptoms in the interval. The total time spent in the appointment was 30 minutes. Disclaimer: This note was dictated with voice recognition software. Similar sounding words can inadvertently be transcribed and may be missed upon review. Lajuana Matte, MD

## 2023-06-25 ENCOUNTER — Other Ambulatory Visit: Payer: Self-pay | Admitting: Internal Medicine

## 2023-06-26 ENCOUNTER — Inpatient Hospital Stay: Payer: BC Managed Care – PPO

## 2023-06-26 ENCOUNTER — Inpatient Hospital Stay (HOSPITAL_BASED_OUTPATIENT_CLINIC_OR_DEPARTMENT_OTHER): Payer: BC Managed Care – PPO | Admitting: Physician Assistant

## 2023-06-26 VITALS — BP 124/100 | HR 106 | Temp 98.3°F | Resp 17

## 2023-06-26 VITALS — BP 125/71 | HR 99 | Resp 19

## 2023-06-26 DIAGNOSIS — F419 Anxiety disorder, unspecified: Secondary | ICD-10-CM | POA: Diagnosis not present

## 2023-06-26 DIAGNOSIS — C3492 Malignant neoplasm of unspecified part of left bronchus or lung: Secondary | ICD-10-CM

## 2023-06-26 DIAGNOSIS — J90 Pleural effusion, not elsewhere classified: Secondary | ICD-10-CM | POA: Diagnosis not present

## 2023-06-26 DIAGNOSIS — R5383 Other fatigue: Secondary | ICD-10-CM | POA: Diagnosis not present

## 2023-06-26 DIAGNOSIS — Z79620 Long term (current) use of immunosuppressive biologic: Secondary | ICD-10-CM | POA: Diagnosis not present

## 2023-06-26 DIAGNOSIS — R7989 Other specified abnormal findings of blood chemistry: Secondary | ICD-10-CM | POA: Diagnosis not present

## 2023-06-26 DIAGNOSIS — E876 Hypokalemia: Secondary | ICD-10-CM | POA: Diagnosis not present

## 2023-06-26 DIAGNOSIS — R410 Disorientation, unspecified: Secondary | ICD-10-CM | POA: Diagnosis not present

## 2023-06-26 DIAGNOSIS — Z9981 Dependence on supplemental oxygen: Secondary | ICD-10-CM | POA: Diagnosis not present

## 2023-06-26 DIAGNOSIS — F319 Bipolar disorder, unspecified: Secondary | ICD-10-CM | POA: Diagnosis not present

## 2023-06-26 DIAGNOSIS — R040 Epistaxis: Secondary | ICD-10-CM | POA: Diagnosis not present

## 2023-06-26 DIAGNOSIS — Z5112 Encounter for antineoplastic immunotherapy: Secondary | ICD-10-CM

## 2023-06-26 DIAGNOSIS — D649 Anemia, unspecified: Secondary | ICD-10-CM

## 2023-06-26 DIAGNOSIS — C787 Secondary malignant neoplasm of liver and intrahepatic bile duct: Secondary | ICD-10-CM | POA: Diagnosis not present

## 2023-06-26 LAB — CBC WITH DIFFERENTIAL (CANCER CENTER ONLY)
Abs Immature Granulocytes: 0.49 10*3/uL — ABNORMAL HIGH (ref 0.00–0.07)
Basophils Absolute: 0 10*3/uL (ref 0.0–0.1)
Basophils Relative: 0 %
Eosinophils Absolute: 0.1 10*3/uL (ref 0.0–0.5)
Eosinophils Relative: 1 %
HCT: 31.7 % — ABNORMAL LOW (ref 36.0–46.0)
Hemoglobin: 10.2 g/dL — ABNORMAL LOW (ref 12.0–15.0)
Immature Granulocytes: 5 %
Lymphocytes Relative: 6 %
Lymphs Abs: 0.7 10*3/uL (ref 0.7–4.0)
MCH: 34.7 pg — ABNORMAL HIGH (ref 26.0–34.0)
MCHC: 32.2 g/dL (ref 30.0–36.0)
MCV: 107.8 fL — ABNORMAL HIGH (ref 80.0–100.0)
Monocytes Absolute: 0.9 10*3/uL (ref 0.1–1.0)
Monocytes Relative: 8 %
Neutro Abs: 8.9 10*3/uL — ABNORMAL HIGH (ref 1.7–7.7)
Neutrophils Relative %: 80 %
Platelet Count: 274 10*3/uL (ref 150–400)
RBC: 2.94 MIL/uL — ABNORMAL LOW (ref 3.87–5.11)
RDW: 18.5 % — ABNORMAL HIGH (ref 11.5–15.5)
WBC Count: 11 10*3/uL — ABNORMAL HIGH (ref 4.0–10.5)
nRBC: 0.5 % — ABNORMAL HIGH (ref 0.0–0.2)

## 2023-06-26 LAB — CMP (CANCER CENTER ONLY)
ALT: 24 U/L (ref 0–44)
AST: 34 U/L (ref 15–41)
Albumin: 3.3 g/dL — ABNORMAL LOW (ref 3.5–5.0)
Alkaline Phosphatase: 124 U/L (ref 38–126)
Anion gap: 5 (ref 5–15)
BUN: 19 mg/dL (ref 6–20)
CO2: 43 mmol/L — ABNORMAL HIGH (ref 22–32)
Calcium: 9.5 mg/dL (ref 8.9–10.3)
Chloride: 98 mmol/L (ref 98–111)
Creatinine: 1.35 mg/dL — ABNORMAL HIGH (ref 0.44–1.00)
GFR, Estimated: 46 mL/min — ABNORMAL LOW (ref >60)
Glucose, Bld: 194 mg/dL — ABNORMAL HIGH (ref 70–99)
Potassium: 3.2 mmol/L — ABNORMAL LOW (ref 3.5–5.1)
Sodium: 146 mmol/L — ABNORMAL HIGH (ref 135–145)
Total Bilirubin: 0.8 mg/dL (ref 0.0–1.2)
Total Protein: 6.3 g/dL — ABNORMAL LOW (ref 6.5–8.1)

## 2023-06-26 LAB — SAMPLE TO BLOOD BANK

## 2023-06-26 LAB — TSH: TSH: 0.907 u[IU]/mL (ref 0.350–4.500)

## 2023-06-26 MED ORDER — SODIUM CHLORIDE 0.9 % IV SOLN
200.0000 mg | Freq: Once | INTRAVENOUS | Status: AC
  Administered 2023-06-26: 200 mg via INTRAVENOUS
  Filled 2023-06-26: qty 200

## 2023-06-26 MED ORDER — POTASSIUM CHLORIDE CRYS ER 20 MEQ PO TBCR
20.0000 meq | EXTENDED_RELEASE_TABLET | Freq: Every day | ORAL | 0 refills | Status: DC
Start: 2023-06-26 — End: 2023-07-05

## 2023-06-26 MED ORDER — SODIUM CHLORIDE 0.9 % IV SOLN: Freq: Once | INTRAVENOUS | Status: AC

## 2023-06-26 NOTE — Patient Instructions (Signed)

## 2023-06-27 LAB — T4: T4, Total: 9.9 ug/dL (ref 4.5–12.0)

## 2023-06-28 ENCOUNTER — Other Ambulatory Visit: Payer: Self-pay

## 2023-06-29 ENCOUNTER — Emergency Department (HOSPITAL_COMMUNITY): Payer: BC Managed Care – PPO

## 2023-06-29 ENCOUNTER — Emergency Department (HOSPITAL_COMMUNITY)
Admission: EM | Admit: 2023-06-29 | Discharge: 2023-06-30 | Disposition: A | Discharge status: Home / Self Care | Payer: BC Managed Care – PPO | Attending: Emergency Medicine | Admitting: Emergency Medicine

## 2023-06-29 DIAGNOSIS — R300 Dysuria: Secondary | ICD-10-CM | POA: Diagnosis not present

## 2023-06-29 DIAGNOSIS — R519 Headache, unspecified: Secondary | ICD-10-CM | POA: Diagnosis present

## 2023-06-29 DIAGNOSIS — Z79899 Other long term (current) drug therapy: Secondary | ICD-10-CM | POA: Diagnosis not present

## 2023-06-29 LAB — COMPREHENSIVE METABOLIC PANEL
ALT: 25 U/L (ref 0–44)
AST: 33 U/L (ref 15–41)
Albumin: 2.8 g/dL — ABNORMAL LOW (ref 3.5–5.0)
Alkaline Phosphatase: 109 U/L (ref 38–126)
Anion gap: 9 (ref 5–15)
BUN: 22 mg/dL — ABNORMAL HIGH (ref 6–20)
CO2: 41 mmol/L — ABNORMAL HIGH (ref 22–32)
Calcium: 9.4 mg/dL (ref 8.9–10.3)
Chloride: 93 mmol/L — ABNORMAL LOW (ref 98–111)
Creatinine, Ser: 1.28 mg/dL — ABNORMAL HIGH (ref 0.44–1.00)
GFR, Estimated: 49 mL/min — ABNORMAL LOW (ref >60)
Glucose, Bld: 154 mg/dL — ABNORMAL HIGH (ref 70–99)
Potassium: 3.2 mmol/L — ABNORMAL LOW (ref 3.5–5.1)
Sodium: 143 mmol/L (ref 135–145)
Total Bilirubin: 1.1 mg/dL (ref 0.0–1.2)
Total Protein: 6 g/dL — ABNORMAL LOW (ref 6.5–8.1)

## 2023-06-29 LAB — CBC WITH DIFFERENTIAL/PLATELET
Abs Immature Granulocytes: 0.49 10*3/uL — ABNORMAL HIGH (ref 0.00–0.07)
Basophils Absolute: 0 10*3/uL (ref 0.0–0.1)
Basophils Relative: 0 %
Eosinophils Absolute: 0 10*3/uL (ref 0.0–0.5)
Eosinophils Relative: 0 %
HCT: 31.2 % — ABNORMAL LOW (ref 36.0–46.0)
Hemoglobin: 9.8 g/dL — ABNORMAL LOW (ref 12.0–15.0)
Immature Granulocytes: 5 %
Lymphocytes Relative: 4 %
Lymphs Abs: 0.5 10*3/uL — ABNORMAL LOW (ref 0.7–4.0)
MCH: 35.1 pg — ABNORMAL HIGH (ref 26.0–34.0)
MCHC: 31.4 g/dL (ref 30.0–36.0)
MCV: 111.8 fL — ABNORMAL HIGH (ref 80.0–100.0)
Monocytes Absolute: 0.9 10*3/uL (ref 0.1–1.0)
Monocytes Relative: 9 %
Neutro Abs: 8.8 10*3/uL — ABNORMAL HIGH (ref 1.7–7.7)
Neutrophils Relative %: 82 %
Platelets: 207 10*3/uL (ref 150–400)
RBC: 2.79 MIL/uL — ABNORMAL LOW (ref 3.87–5.11)
RDW: 18.1 % — ABNORMAL HIGH (ref 11.5–15.5)
WBC: 10.7 10*3/uL — ABNORMAL HIGH (ref 4.0–10.5)
nRBC: 0 % (ref 0.0–0.2)

## 2023-06-29 LAB — URINALYSIS, ROUTINE W REFLEX MICROSCOPIC
Bilirubin Urine: NEGATIVE
Glucose, UA: NEGATIVE mg/dL
Hgb urine dipstick: NEGATIVE
Ketones, ur: NEGATIVE mg/dL
Nitrite: NEGATIVE
Protein, ur: NEGATIVE mg/dL
Specific Gravity, Urine: 1.005 (ref 1.005–1.030)
pH: 6 (ref 5.0–8.0)

## 2023-06-29 LAB — LIPASE, BLOOD: Lipase: 97 U/L — ABNORMAL HIGH (ref 11–51)

## 2023-06-29 LAB — PREGNANCY, URINE: Preg Test, Ur: NEGATIVE

## 2023-06-29 LAB — RESP PANEL BY RT-PCR (RSV, FLU A&B, COVID)  RVPGX2
Influenza A by PCR: NEGATIVE
Influenza B by PCR: NEGATIVE
Resp Syncytial Virus by PCR: NEGATIVE
SARS Coronavirus 2 by RT PCR: NEGATIVE

## 2023-06-29 MED ORDER — SODIUM CHLORIDE 0.9 % IV BOLUS
1000.0000 mL | Freq: Once | INTRAVENOUS | Status: AC
  Administered 2023-06-29: 1000 mL via INTRAVENOUS

## 2023-06-29 MED ORDER — DOXYCYCLINE HYCLATE 100 MG PO CAPS: 100.0000 mg | ORAL_CAPSULE | Freq: Two times a day (BID) | ORAL | 0 refills | Status: DC

## 2023-06-29 MED ORDER — AMOXICILLIN 500 MG PO CAPS: 1000.0000 mg | ORAL_CAPSULE | Freq: Once | ORAL | Status: DC

## 2023-06-29 MED ORDER — DIPHENHYDRAMINE HCL 50 MG/ML IJ SOLN
25.0000 mg | Freq: Once | INTRAMUSCULAR | Status: AC
  Administered 2023-06-29: 25 mg via INTRAVENOUS
  Filled 2023-06-29: qty 1

## 2023-06-29 MED ORDER — IOHEXOL 350 MG/ML SOLN
80.0000 mL | Freq: Once | INTRAVENOUS | Status: AC | PRN
  Administered 2023-06-29: 80 mL via INTRAVENOUS

## 2023-06-29 MED ORDER — PROCHLORPERAZINE EDISYLATE 10 MG/2ML IJ SOLN
10.0000 mg | Freq: Once | INTRAMUSCULAR | Status: AC
  Administered 2023-06-29: 10 mg via INTRAVENOUS
  Filled 2023-06-29: qty 2

## 2023-06-29 MED ORDER — DOXYCYCLINE HYCLATE 100 MG PO TABS
100.0000 mg | ORAL_TABLET | Freq: Once | ORAL | Status: AC
  Administered 2023-06-29: 100 mg via ORAL
  Filled 2023-06-29: qty 1

## 2023-06-29 NOTE — ED Provider Notes (Signed)
 Patient signed out to me by previous provider. Please refer to their note for full HPI.  Briefly this is a 58 year old female who presented with headache and dysuria.  Blood work is baseline for the patient, urinalysis shows no infection, pregnancy test is negative.  Initially CT head imaging was done for headache which showed concern for an abnormal course of the right cavernous ICA and CTA was recommended.  Signout pending CTA imaging.  This was done and shows incidental findings but none that appear acute.  There is identification of some findings in the upper lungs, questionable pneumonia.  Discussed with the patient and she is having a productive cough.  Will treat as such and refer outpatient for repeat imaging.  Otherwise her headache has resolved.  I do not appreciate any acute findings in regard to headache, she is otherwise neuro intact.  Will plan to treat with antibiotics and outpatient follow-up.  Patient at this time appears safe and stable for discharge and close outpatient follow up. Discharge plan and strict return to ED precautions discussed, patient verbalizes understanding and agreement.   Rozelle Logan, DO 06/29/23 2345

## 2023-06-29 NOTE — Discharge Instructions (Signed)
 You have been seen and discharged from the emergency department.  Your workup today showed findings of pneumonia.  Take antibiotic as directed.  It is importantly follow-up as an outpatient for repeat chest imaging for resolution of infection.  Your urinalysis showed no infection.  The CT imaging did not show any acute findings.  Follow-up with your primary provider for further evaluation and further care. Take home medications as prescribed. If you have any worsening symptoms or further concerns for your health please return to an emergency department for further evaluation.

## 2023-06-29 NOTE — ED Provider Notes (Signed)
 [place] EMERGENCY DEPARTMENT AT [facility] Provider Note   CSN: [id number] Arrival date & time: 06/29/23  1155     History  Chief Complaint  Patient presents with   Headache   Dysuria    Madison Palmer is a 58 y.o. female.  58 yo  F with a chief complaints of headache and dysuria.  Says like this occurred acutely today.  Patient states that her head hurts all over.  Nothing seems to make it better or worse.  She denies one-sided numbness or weakness denies difficulty speech or swallowing.  Denies head trauma.   Headache Dysuria      Home Medications Prior to Admission medications   Medication Sig Start Date End Date Taking? Authorizing Provider  acetaminophen (TYLENOL) 500 MG tablet Take 1,000 mg by mouth 3 (three) times daily as needed for moderate pain or headache.    [provider]  amLODipine (NORVASC) 10 MG tablet TAKE 1 TABLET BY MOUTH EVERY DAY 04/10/23   Rollene Rotunda, MD  atorvastatin (LIPITOR) 20 MG tablet TAKE 1 TABLET(20 MG) BY MOUTH 3 TIMES A WEEK 04/17/23   [name], [name], NP  benztropine (COGENTIN) 0.5 MG tablet Take 1 tablet (0.5 mg total) by mouth at bedtime. 04/10/23 07/09/23  Arfeen, Phillips Grout, MD  cetirizine (ZYRTEC) 10 MG tablet TAKE 1 TABLET BY MOUTH EVERY DAY Patient not taking: Reported on 06/06/2023 12/21/22   [name], [name], NP  Cholecalciferol (VITAMIN D3 MAXIMUM STRENGTH) 125 MCG (5000 UT) capsule Take 5,000 Units by mouth daily.    [provider]  esomeprazole (NEXIUM) 40 MG capsule TAKE 1 CAPSULE BY MOUTH TWICE DAILY 01/30/23   Mansouraty, Netty Starring., MD  fenofibrate (TRICOR) 145 MG tablet TAKE 1 TABLET(145 MG) BY MOUTH DAILY 12/12/22   [name], [name], NP  folic acid (FOLVITE) 1 MG tablet TAKE 1 TABLET BY MOUTH DAILY 04/03/23   Heilingoetter, Cassandra L, PA-C  furosemide (LASIX) 40 MG tablet TAKE 1 TABLET BY MOUTH EVERY DAY 04/27/23   Rollene Rotunda, MD  gabapentin (NEURONTIN) 100 MG capsule TAKE 2  CAPSULES(200 MG) BY MOUTH AT BEDTIME 07/18/22   [name], [name], NP  labetalol (NORMODYNE) 300 MG tablet TAKE 1 AND 1/2 TABLETS BY MOUTH TWICE DAILY 06/22/23   Rollene Rotunda, MD  levothyroxine (SYNTHROID) 88 MCG tablet Take 88 mcg by mouth daily. 07/28/21   [provider]  lurasidone (LATUDA) 40 MG TABS tablet Take 1 tablet (40 mg total) by mouth daily with breakfast. 04/10/23   Arfeen, Phillips Grout, MD  nystatin (MYCOSTATIN/NYSTOP) powder Apply topically 3 (three) times daily. 06/14/23   Leatha Gilding, MD  Polyethylene Glycol 3350 (MIRALAX PO) Take 1 Capful by mouth daily as needed (Constipation).    [provider]  potassium chloride SA (KLOR-CON M) 20 MEQ tablet Take 1 tablet (20 mEq total) by mouth daily. 06/26/23   Heilingoetter, Cassandra L, PA-C  prochlorperazine (COMPAZINE) 10 MG tablet Take 1 tablet (10 mg total) by mouth every 6 (six) hours as needed. 09/28/22   Heilingoetter, Cassandra L, PA-C  TART CHERRY PO Take 1 tablet by mouth daily.    [provider]      Allergies    Other, Fentanyl, Levofloxacin, Midazolam, Pollen extract, Atorvastatin, Hydralazine hcl, and Rosuvastatin    Review of Systems   Review of Systems  Genitourinary:  Positive for dysuria.  Neurological:  Positive for headaches.    Physical Exam Updated Vital Signs BP (!) 141/97 (BP Location: Right  Arm)   Pulse 75   Temp 98.1 F (36.7 C) (Oral)   Resp 19   LMP 05/02/2017   SpO2 96%  Physical Exam Vitals and nursing note reviewed.  Constitutional:      General: She is not in acute distress.    Appearance: She is well-developed. She is not diaphoretic.  HENT:     Head: Normocephalic and atraumatic.  Eyes:     Pupils: Pupils are equal, round, and reactive to light.  Cardiovascular:     Rate and Rhythm: Normal rate and regular rhythm.     Heart sounds: No murmur heard.    No friction rub. No gallop.  Pulmonary:     Effort: Pulmonary effort is normal.     Breath sounds: No  wheezing or rales.  Abdominal:     General: There is no distension.     Palpations: Abdomen is soft.     Tenderness: There is no abdominal tenderness.  Musculoskeletal:        General: No tenderness.     Cervical back: Normal range of motion and neck supple.  Skin:    General: Skin is warm and dry.  Neurological:     Mental Status: She is alert and oriented to person, place, and time.     GCS: GCS eye subscore is 4. GCS verbal subscore is 5. GCS motor subscore is 6.     Cranial Nerves: Cranial nerves 2-12 are intact.     Sensory: Sensation is intact.     Motor: Motor function is intact.  Psychiatric:        Behavior: Behavior normal.     ED Results / Procedures / Treatments   Labs (all labs ordered are listed, but only abnormal results are displayed) Labs Reviewed  CBC WITH DIFFERENTIAL/PLATELET - Abnormal; Notable for the following components:      Result Value   WBC 10.7 (*)    RBC 2.79 (*)    Hemoglobin 9.8 (*)    HCT 31.2 (*)    MCV 111.8 (*)    MCH 35.1 (*)    RDW 18.1 (*)    Neutro Abs 8.8 (*)    Lymphs Abs 0.5 (*)    Abs Immature Granulocytes 0.49 (*)    All other components within normal limits  COMPREHENSIVE METABOLIC PANEL - Abnormal; Notable for the following components:   Potassium 3.2 (*)    Chloride 93 (*)    CO2 41 (*)    Glucose, Bld 154 (*)    BUN 22 (*)    Creatinine, Ser 1.28 (*)    Total Protein 6.0 (*)    Albumin 2.8 (*)    GFR, Estimated 49 (*)    All other components within normal limits  LIPASE, BLOOD - Abnormal; Notable for the following components:   Lipase 97 (*)    All other components within normal limits  RESP PANEL BY RT-PCR (RSV, FLU A&B, COVID)  RVPGX2  URINALYSIS, ROUTINE W REFLEX MICROSCOPIC    EKG None  Radiology CT Head Wo Contrast Result Date: 06/29/2023 CLINICAL DATA:  New onset headache. EXAM: CT HEAD WITHOUT CONTRAST TECHNIQUE: Contiguous axial images were obtained from the base of the skull through the vertex  without intravenous contrast. RADIATION DOSE REDUCTION: This exam was performed according to the departmental dose-optimization program which includes automated exposure control, adjustment of the mA and/or kV according to patient size and/or use of iterative reconstruction technique. COMPARISON:  MRI head 06/07/2023 FINDINGS: Brain: No acute  intracranial hemorrhage. No CT evidence of acute infarct. No edema, mass effect, or midline shift. The basilar cisterns are patent. Ventricles: The ventricles are normal. Vascular: Atherosclerotic calcifications of the bilateral carotid siphons. Prominent medial course of the right cavernous ICA. No hyperdense vessel. Skull: No acute or aggressive finding. Orbits: Orbits are symmetric. Sinuses: Mild mucosal thickening in the ethmoid and maxillary sinuses. Other: Mastoid air cells are clear. Postsurgical changes of the turbinates. IMPRESSION: 1. No CT evidence of acute intracranial abnormality. 2. Prominent medial course of the right cavernous ICA potentially exerting mass effect on the pituitary. Consider CTA for further evaluation. Electronically Signed   By: Emily Filbert M.D.   On: 06/29/2023 14:49    Procedures Procedures    Medications Ordered in ED Medications  prochlorperazine (COMPAZINE) injection 10 mg (10 mg Intravenous Given 06/29/23 1321)  diphenhydrAMINE (BENADRYL) injection 25 mg (25 mg Intravenous Given 06/29/23 1321)  sodium chloride 0.9 % bolus 1,000 mL (1,000 mLs Intravenous New Bag/Given 06/29/23 1320)    ED Course/ Medical Decision Making/ A&P                                 Medical Decision Making Amount and/or Complexity of Data Reviewed Labs: ordered. Radiology: ordered.  Risk Prescription drug management.   58 yo F with a chief complaints of both of headache and urinary symptoms.  This started acutely today.  She has trouble describing her symptoms well.  CT scan of the head the radiologist read concerning for possible mass effect  on the pituitary.  Recommending CT angiogram of the head and neck.  Will obtain here.  Blood work without significant electrolyte abnormality.  No acute anemia.  Awaiting UA.   Signed out to Dr. Wilkie Aye, please see their note for further details of care in the ED.  The patients results and plan were reviewed and discussed.   Any x-rays performed were independently reviewed by myself.   Differential diagnosis were considered with the presenting HPI.  Medications  prochlorperazine (COMPAZINE) injection 10 mg (10 mg Intravenous Given 06/29/23 1321)  diphenhydrAMINE (BENADRYL) injection 25 mg (25 mg Intravenous Given 06/29/23 1321)  sodium chloride 0.9 % bolus 1,000 mL (1,000 mLs Intravenous New Bag/Given 06/29/23 1320)    Vitals:   06/29/23 1215 06/29/23 1334  BP: 99/75 (!) 141/97  Pulse: 79 75  Resp: 19 19  Temp: 98 F (36.7 C) 98.1 F (36.7 C)  TempSrc: Oral Oral  SpO2: 99% 96%    Final diagnoses:  Nonintractable headache, unspecified chronicity pattern, unspecified headache type  Dysuria    Admission/ observation were discussed with the admitting physician, patient and/or family and they are comfortable with the plan.          Final Clinical Impression(s) / ED Diagnoses Final diagnoses:  Nonintractable headache, unspecified chronicity pattern, unspecified headache type  Dysuria    Rx / DC Orders ED Discharge Orders     None         Melene Plan, DO 06/29/23 1619

## 2023-06-29 NOTE — ED Triage Notes (Addendum)
 Pt arrives via EMS from [facility] headache and UTI symptoms for unknown period of time. EMS states staff called due to pt screaming out due to headache, pt denies knowing why she's at the hospital. EMS states per SNF staff pt is at baseline mentation, able to follow commands and answer some questions. AxOx2

## 2023-06-30 NOTE — ED Notes (Addendum)
 This RN to room due to pt yelling "Hello!" Found pt sitting on chair at bedside. Both side rails up, bed alarm thrown on floor, and bed sheets wet. This RN assessed pt needs in which the pt responded that "nothing was wrong." Instructed pt to stay seated in chair as this RN changed out bedding and replaced bed alarm. RN called for assistance while still keeping eyes on pt. Irene Pap, RN assisted with transferring pt to bed. This RN noticed blood from a skin tear that has previously been documented on pt left buttock. This RN cleaned and redress wound. Assisted pt to position of comfort and instructed pt to use call bell and to stay in bed until she had assistance. Bed alarm turned on, and both side rails up.

## 2023-07-01 DIAGNOSIS — G4730 Sleep apnea, unspecified: Secondary | ICD-10-CM | POA: Insufficient documentation

## 2023-07-01 NOTE — Progress Notes (Deleted)
 Cardiology Office Note:   Date:  07/01/2023  ID:  Madison Palmer, DOB 1966/03/26, MRN [id number] PCP: Anne Ng, NP  [facility] Providers Cardiologist:  Rollene Rotunda, MD {  History of Present Illness:   Madison Palmer is a 58 y.o. female who presents for follow up of dyspnea.   She had an echo in Nov with a normal EF of 60 - 65% with severe concentric LVH.   She also is noted to have a past history of difficult to control hypertension.  She moved here from [place].  She has had LVH with preserved ejection fraction.  She had some mild internal carotid artery plaque noted on Doppler in the past.  She had a perfusion study in 2018 that demonstrated no evidence of ischemia or infarct.  She is been managed for her hypertension.     She has hypercalcemia.  This is managed at [facility].   She had MRI which demonstrates severe LVH but no amlyoid.  ***   ***  She has followed with weight loss and is managed to slowly drift down her weight.  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  She has been more active than this.  Her feet up and down more so she has a little more swelling.  The patient denies any new symptoms such as chest discomfort, neck or arm discomfort. There has been no new shortness of breath, PND or orthopnea. There have been no reported palpitations, presyncope or syncope.    ROS: ***  Studies Reviewed:    EKG:       ***  Risk Assessment/Calculations:   {Does this patient have ATRIAL FIBRILLATION?[phone]}          Physical Exam:   VS:  LMP 05/02/2017    Wt Readings from Last 3 Encounters:  06/06/23 283 lb 8.2 oz (128.6 kg)  05/29/23 281 lb 8 oz (127.7 kg)  05/28/23 277 lb (125.6 kg)     GEN: Well nourished, well developed in no acute distress NECK: No JVD; No carotid bruits CARDIAC: ***RR, *** murmurs, rubs, gallops RESPIRATORY:  Clear to auscultation without rales, wheezing or rhonchi  ABDOMEN:  Soft, non-tender, non-distended EXTREMITIES:  No edema; No deformity   ASSESSMENT AND PLAN:   HTN: ***  Blood pressure is well-controlled.  No change in therapy.   PULMONARY NODULES:  ***   She has follow-up with Dr. Vassie Loll.  I will defer any further imaging.  Her last CT was in 2022.    LVH: This is ***  likely related to hypertension.  She had no evidence of amyloid.   EDEMA:   ***  No change in therapy   CHRONIC DIASTOLIC HF/LVH:  She has severe LVH.    ***  There is no amyloid on MRI.      OBESITY:  ***  She is working with a weight loss program.     CORONARY CALCIUM:   ***  This is followed at [facility].  No change in therapy.        SLEEP APNEA:   She wears CPAP.  ***   She has O2 at night.   No change in therapy.    CAROTID STENOSIS :   She had very mild plaque in 2020.  ***  No further imaging.    DYSLIPIDEMIA:    LDL was ***  elevated but she has not tolerated increased statins.  LDL did come  down from 130s to 102.  Continue current therapy.  She is not protein forward diet and we talked specifically about plant proteins .     Follow up ***  Signed, Rollene Rotunda, MD

## 2023-07-02 ENCOUNTER — Telehealth: Payer: Self-pay

## 2023-07-02 NOTE — Transitions of Care (Post Inpatient/ED Visit) (Signed)
   07/02/2023  Name: Madison Palmer MRN: [id number] DOB: [birth year]  Today's TOC FU Call Status: Today's TOC FU Call Status:: Unsuccessful Call (1st Attempt) Unsuccessful Call (1st Attempt) Date: 07/02/23  Attempted to reach the patient regarding the most recent Inpatient/ED visit.  Follow Up Plan: Additional outreach attempts will be made to reach the patient to complete the Transitions of Care (Post Inpatient/ED visit) call.   Signature  Karlos Scadden D, CMA

## 2023-07-03 ENCOUNTER — Inpatient Hospital Stay: Payer: BC Managed Care – PPO | Attending: Physician Assistant

## 2023-07-03 ENCOUNTER — Ambulatory Visit: Payer: BC Managed Care – PPO | Attending: Cardiology | Admitting: Cardiology

## 2023-07-03 DIAGNOSIS — G4730 Sleep apnea, unspecified: Secondary | ICD-10-CM

## 2023-07-03 DIAGNOSIS — R931 Abnormal findings on diagnostic imaging of heart and coronary circulation: Secondary | ICD-10-CM

## 2023-07-03 DIAGNOSIS — M7989 Other specified soft tissue disorders: Secondary | ICD-10-CM

## 2023-07-03 DIAGNOSIS — E785 Hyperlipidemia, unspecified: Secondary | ICD-10-CM

## 2023-07-05 ENCOUNTER — Other Ambulatory Visit: Payer: Self-pay | Admitting: Physician Assistant

## 2023-07-06 ENCOUNTER — Encounter: Payer: Self-pay | Admitting: Cardiology

## 2023-07-08 ENCOUNTER — Other Ambulatory Visit (HOSPITAL_COMMUNITY): Payer: Self-pay | Admitting: Psychiatry

## 2023-07-08 ENCOUNTER — Other Ambulatory Visit: Payer: Self-pay | Admitting: Nurse Practitioner

## 2023-07-08 DIAGNOSIS — F319 Bipolar disorder, unspecified: Secondary | ICD-10-CM

## 2023-07-08 DIAGNOSIS — M51369 Other intervertebral disc degeneration, lumbar region without mention of lumbar back pain or lower extremity pain: Secondary | ICD-10-CM

## 2023-07-08 DIAGNOSIS — M5010 Cervical disc disorder with radiculopathy, unspecified cervical region: Secondary | ICD-10-CM

## 2023-07-08 DIAGNOSIS — R251 Tremor, unspecified: Secondary | ICD-10-CM

## 2023-07-09 ENCOUNTER — Other Ambulatory Visit: Payer: Self-pay | Admitting: Cardiology

## 2023-07-10 ENCOUNTER — Ambulatory Visit: Payer: BC Managed Care – PPO | Admitting: Internal Medicine

## 2023-07-10 ENCOUNTER — Other Ambulatory Visit: Payer: BC Managed Care – PPO

## 2023-07-10 ENCOUNTER — Ambulatory Visit (INDEPENDENT_AMBULATORY_CARE_PROVIDER_SITE_OTHER): Payer: BC Managed Care – PPO | Admitting: Family Medicine

## 2023-07-10 ENCOUNTER — Ambulatory Visit: Payer: BC Managed Care – PPO

## 2023-07-12 NOTE — Telephone Encounter (Signed)
 Medication: Gabapentin 100 mg  Directions: Take 1 capsules by mouth at bedtime  Last given: 07/17/22 Number refills: 3 Last o/v: 03/15/23 (acute)  Follow up: 3 months  Labs:

## 2023-07-13 ENCOUNTER — Other Ambulatory Visit: Payer: Self-pay

## 2023-07-14 ENCOUNTER — Inpatient Hospital Stay (HOSPITAL_COMMUNITY)

## 2023-07-14 ENCOUNTER — Emergency Department (HOSPITAL_COMMUNITY)

## 2023-07-14 ENCOUNTER — Other Ambulatory Visit: Payer: Self-pay

## 2023-07-14 ENCOUNTER — Inpatient Hospital Stay (HOSPITAL_COMMUNITY)
Admission: EM | Admit: 2023-07-14 | Discharge: 2023-07-20 | DRG: 435 | Disposition: A | Discharge status: Hospice | Attending: Internal Medicine | Admitting: Internal Medicine

## 2023-07-14 ENCOUNTER — Encounter (HOSPITAL_COMMUNITY): Payer: Self-pay

## 2023-07-14 DIAGNOSIS — I5033 Acute on chronic diastolic (congestive) heart failure: Secondary | ICD-10-CM | POA: Diagnosis present

## 2023-07-14 DIAGNOSIS — K7682 Hepatic encephalopathy: Secondary | ICD-10-CM | POA: Diagnosis present

## 2023-07-14 DIAGNOSIS — E870 Hyperosmolality and hypernatremia: Secondary | ICD-10-CM | POA: Diagnosis not present

## 2023-07-14 DIAGNOSIS — C7951 Secondary malignant neoplasm of bone: Secondary | ICD-10-CM | POA: Diagnosis present

## 2023-07-14 DIAGNOSIS — N390 Urinary tract infection, site not specified: Secondary | ICD-10-CM | POA: Diagnosis present

## 2023-07-14 DIAGNOSIS — Z6841 Body Mass Index (BMI) 40.0 and over, adult: Secondary | ICD-10-CM | POA: Diagnosis not present

## 2023-07-14 DIAGNOSIS — C3492 Malignant neoplasm of unspecified part of left bronchus or lung: Secondary | ICD-10-CM | POA: Diagnosis present

## 2023-07-14 DIAGNOSIS — F250 Schizoaffective disorder, bipolar type: Secondary | ICD-10-CM | POA: Diagnosis present

## 2023-07-14 DIAGNOSIS — Z79899 Other long term (current) drug therapy: Secondary | ICD-10-CM

## 2023-07-14 DIAGNOSIS — D696 Thrombocytopenia, unspecified: Secondary | ICD-10-CM | POA: Diagnosis present

## 2023-07-14 DIAGNOSIS — N1831 Chronic kidney disease, stage 3a: Secondary | ICD-10-CM | POA: Diagnosis present

## 2023-07-14 DIAGNOSIS — K219 Gastro-esophageal reflux disease without esophagitis: Secondary | ICD-10-CM | POA: Diagnosis not present

## 2023-07-14 DIAGNOSIS — E785 Hyperlipidemia, unspecified: Secondary | ICD-10-CM | POA: Diagnosis present

## 2023-07-14 DIAGNOSIS — E1169 Type 2 diabetes mellitus with other specified complication: Secondary | ICD-10-CM | POA: Diagnosis present

## 2023-07-14 DIAGNOSIS — Z888 Allergy status to other drugs, medicaments and biological substances status: Secondary | ICD-10-CM

## 2023-07-14 DIAGNOSIS — G4730 Sleep apnea, unspecified: Secondary | ICD-10-CM | POA: Diagnosis present

## 2023-07-14 DIAGNOSIS — H409 Unspecified glaucoma: Secondary | ICD-10-CM | POA: Diagnosis present

## 2023-07-14 DIAGNOSIS — G9341 Metabolic encephalopathy: Secondary | ICD-10-CM | POA: Diagnosis present

## 2023-07-14 DIAGNOSIS — E038 Other specified hypothyroidism: Secondary | ICD-10-CM

## 2023-07-14 DIAGNOSIS — Z860100 Personal history of colon polyps, unspecified: Secondary | ICD-10-CM

## 2023-07-14 DIAGNOSIS — Z79890 Hormone replacement therapy (postmenopausal): Secondary | ICD-10-CM

## 2023-07-14 DIAGNOSIS — Z885 Allergy status to narcotic agent status: Secondary | ICD-10-CM

## 2023-07-14 DIAGNOSIS — E1122 Type 2 diabetes mellitus with diabetic chronic kidney disease: Secondary | ICD-10-CM | POA: Diagnosis present

## 2023-07-14 DIAGNOSIS — Z91048 Other nonmedicinal substance allergy status: Secondary | ICD-10-CM

## 2023-07-14 DIAGNOSIS — R4182 Altered mental status, unspecified: Principal | ICD-10-CM

## 2023-07-14 DIAGNOSIS — Z66 Do not resuscitate: Secondary | ICD-10-CM | POA: Diagnosis present

## 2023-07-14 DIAGNOSIS — E1151 Type 2 diabetes mellitus with diabetic peripheral angiopathy without gangrene: Secondary | ICD-10-CM | POA: Diagnosis present

## 2023-07-14 DIAGNOSIS — N183 Chronic kidney disease, stage 3 unspecified: Secondary | ICD-10-CM | POA: Diagnosis not present

## 2023-07-14 DIAGNOSIS — R1310 Dysphagia, unspecified: Secondary | ICD-10-CM | POA: Diagnosis present

## 2023-07-14 DIAGNOSIS — E039 Hypothyroidism, unspecified: Secondary | ICD-10-CM | POA: Diagnosis present

## 2023-07-14 DIAGNOSIS — I130 Hypertensive heart and chronic kidney disease with heart failure and stage 1 through stage 4 chronic kidney disease, or unspecified chronic kidney disease: Secondary | ICD-10-CM | POA: Diagnosis present

## 2023-07-14 DIAGNOSIS — Z7189 Other specified counseling: Secondary | ICD-10-CM | POA: Diagnosis not present

## 2023-07-14 DIAGNOSIS — D631 Anemia in chronic kidney disease: Secondary | ICD-10-CM | POA: Diagnosis present

## 2023-07-14 DIAGNOSIS — C787 Secondary malignant neoplasm of liver and intrahepatic bile duct: Secondary | ICD-10-CM | POA: Diagnosis present

## 2023-07-14 DIAGNOSIS — Z515 Encounter for palliative care: Secondary | ICD-10-CM

## 2023-07-14 DIAGNOSIS — Z1152 Encounter for screening for COVID-19: Secondary | ICD-10-CM | POA: Diagnosis not present

## 2023-07-14 DIAGNOSIS — Z881 Allergy status to other antibiotic agents status: Secondary | ICD-10-CM

## 2023-07-14 DIAGNOSIS — I10 Essential (primary) hypertension: Secondary | ICD-10-CM | POA: Diagnosis not present

## 2023-07-14 DIAGNOSIS — E876 Hypokalemia: Secondary | ICD-10-CM | POA: Diagnosis present

## 2023-07-14 DIAGNOSIS — Z9221 Personal history of antineoplastic chemotherapy: Secondary | ICD-10-CM

## 2023-07-14 DIAGNOSIS — E213 Hyperparathyroidism, unspecified: Secondary | ICD-10-CM | POA: Diagnosis present

## 2023-07-14 LAB — URINALYSIS, W/ REFLEX TO CULTURE (INFECTION SUSPECTED)
Bilirubin Urine: NEGATIVE
Glucose, UA: NEGATIVE mg/dL
Hgb urine dipstick: NEGATIVE
Ketones, ur: NEGATIVE mg/dL
Nitrite: POSITIVE — AB
Protein, ur: NEGATIVE mg/dL
Specific Gravity, Urine: 1.015 (ref 1.005–1.030)
pH: 6 (ref 5.0–8.0)

## 2023-07-14 LAB — I-STAT CG4 LACTIC ACID, ED
Lactic Acid, Venous: 1.2 mmol/L (ref 0.5–1.9)
Lactic Acid, Venous: 0.7 mmol/L (ref 0.5–1.9)

## 2023-07-14 LAB — RESP PANEL BY RT-PCR (RSV, FLU A&B, COVID)  RVPGX2
Influenza A by PCR: NEGATIVE
Influenza B by PCR: NEGATIVE
Resp Syncytial Virus by PCR: NEGATIVE
SARS Coronavirus 2 by RT PCR: NEGATIVE

## 2023-07-14 LAB — CBC WITH DIFFERENTIAL/PLATELET
Abs Immature Granulocytes: 0.13 10*3/uL — ABNORMAL HIGH (ref 0.00–0.07)
Basophils Absolute: 0 10*3/uL (ref 0.0–0.1)
Basophils Relative: 0 %
Eosinophils Absolute: 0 10*3/uL (ref 0.0–0.5)
Eosinophils Relative: 0 %
HCT: 33.3 % — ABNORMAL LOW (ref 36.0–46.0)
Hemoglobin: 10.2 g/dL — ABNORMAL LOW (ref 12.0–15.0)
Immature Granulocytes: 1 %
Lymphocytes Relative: 4 %
Lymphs Abs: 0.4 10*3/uL — ABNORMAL LOW (ref 0.7–4.0)
MCH: 33.8 pg (ref 26.0–34.0)
MCHC: 30.6 g/dL (ref 30.0–36.0)
MCV: 110.3 fL — ABNORMAL HIGH (ref 80.0–100.0)
Monocytes Absolute: 0.5 10*3/uL (ref 0.1–1.0)
Monocytes Relative: 5 %
Neutro Abs: 9.6 10*3/uL — ABNORMAL HIGH (ref 1.7–7.7)
Neutrophils Relative %: 90 %
Platelets: 132 10*3/uL — ABNORMAL LOW (ref 150–400)
RBC: 3.02 MIL/uL — ABNORMAL LOW (ref 3.87–5.11)
RDW: 15.9 % — ABNORMAL HIGH (ref 11.5–15.5)
WBC: 10.7 10*3/uL — ABNORMAL HIGH (ref 4.0–10.5)
nRBC: 0 % (ref 0.0–0.2)

## 2023-07-14 LAB — COMPREHENSIVE METABOLIC PANEL
ALT: 28 U/L (ref 0–44)
AST: 36 U/L (ref 15–41)
Albumin: 2.6 g/dL — ABNORMAL LOW (ref 3.5–5.0)
Alkaline Phosphatase: 99 U/L (ref 38–126)
Anion gap: 14 (ref 5–15)
BUN: 24 mg/dL — ABNORMAL HIGH (ref 6–20)
CO2: 35 mmol/L — ABNORMAL HIGH (ref 22–32)
Calcium: 10.1 mg/dL (ref 8.9–10.3)
Chloride: 93 mmol/L — ABNORMAL LOW (ref 98–111)
Creatinine, Ser: 1.08 mg/dL — ABNORMAL HIGH (ref 0.44–1.00)
GFR, Estimated: 60 mL/min — ABNORMAL LOW (ref >60)
Glucose, Bld: 133 mg/dL — ABNORMAL HIGH (ref 70–99)
Potassium: 4.4 mmol/L (ref 3.5–5.1)
Sodium: 142 mmol/L (ref 135–145)
Total Bilirubin: 1.1 mg/dL (ref 0.0–1.2)
Total Protein: 5.2 g/dL — ABNORMAL LOW (ref 6.5–8.1)

## 2023-07-14 LAB — PROTIME-INR
INR: 1.2 (ref 0.8–1.2)
Prothrombin Time: 15.4 s — ABNORMAL HIGH (ref 11.4–15.2)

## 2023-07-14 LAB — RAPID URINE DRUG SCREEN, HOSP PERFORMED
Amphetamines: NOT DETECTED
Barbiturates: NOT DETECTED
Benzodiazepines: POSITIVE — AB
Cocaine: NOT DETECTED
Opiates: NOT DETECTED
Tetrahydrocannabinol: NOT DETECTED

## 2023-07-14 LAB — I-STAT VENOUS BLOOD GAS, ED
Acid-Base Excess: 22 mmol/L — ABNORMAL HIGH (ref 0.0–2.0)
Bicarbonate: 47.6 mmol/L — ABNORMAL HIGH (ref 20.0–28.0)
Calcium, Ion: 1.25 mmol/L (ref 1.15–1.40)
HCT: 32 % — ABNORMAL LOW (ref 36.0–46.0)
Hemoglobin: 10.9 g/dL — ABNORMAL LOW (ref 12.0–15.0)
O2 Saturation: 98 %
Potassium: 4.2 mmol/L (ref 3.5–5.1)
Sodium: 139 mmol/L (ref 135–145)
TCO2: 49 mmol/L — ABNORMAL HIGH (ref 22–32)
pCO2, Ven: 55.7 mmHg (ref 44–60)
pH, Ven: 7.54 — ABNORMAL HIGH (ref 7.25–7.43)
pO2, Ven: 98 mmHg — ABNORMAL HIGH (ref 32–45)

## 2023-07-14 LAB — I-STAT CHEM 8, ED
BUN: 30 mg/dL — ABNORMAL HIGH (ref 6–20)
Calcium, Ion: 1.21 mmol/L (ref 1.15–1.40)
Chloride: 94 mmol/L — ABNORMAL LOW (ref 98–111)
Creatinine, Ser: 1.4 mg/dL — ABNORMAL HIGH (ref 0.44–1.00)
Glucose, Bld: 126 mg/dL — ABNORMAL HIGH (ref 70–99)
HCT: 33 % — ABNORMAL LOW (ref 36.0–46.0)
Hemoglobin: 11.2 g/dL — ABNORMAL LOW (ref 12.0–15.0)
Potassium: 4.2 mmol/L (ref 3.5–5.1)
Sodium: 138 mmol/L (ref 135–145)
TCO2: 44 mmol/L — ABNORMAL HIGH (ref 22–32)

## 2023-07-14 LAB — TROPONIN I (HIGH SENSITIVITY)
Troponin I (High Sensitivity): 56 ng/L — ABNORMAL HIGH (ref <18)
Troponin I (High Sensitivity): 53 ng/L — ABNORMAL HIGH (ref <18)

## 2023-07-14 LAB — BRAIN NATRIURETIC PEPTIDE: B Natriuretic Peptide: 175.5 pg/mL — ABNORMAL HIGH (ref 0.0–100.0)

## 2023-07-14 LAB — ETHANOL: Alcohol, Ethyl (B): <10 mg/dL (ref <10)

## 2023-07-14 LAB — AMMONIA: Ammonia: 84 umol/L — ABNORMAL HIGH (ref 9–35)

## 2023-07-14 LAB — LIPASE, BLOOD: Lipase: 130 U/L — ABNORMAL HIGH (ref 11–51)

## 2023-07-14 LAB — CK: Total CK: 34 U/L — ABNORMAL LOW (ref 38–234)

## 2023-07-14 MED ORDER — AMLODIPINE BESYLATE 10 MG PO TABS
10.0000 mg | ORAL_TABLET | Freq: Every day | ORAL | Status: DC
  Administered 2023-07-14 – 2023-07-18 (×5): 10 mg via ORAL
  Filled 2023-07-14 (×6): qty 1

## 2023-07-14 MED ORDER — FUROSEMIDE 40 MG PO TABS
40.0000 mg | ORAL_TABLET | Freq: Every day | ORAL | Status: DC
  Administered 2023-07-15 – 2023-07-16 (×2): 40 mg via ORAL
  Filled 2023-07-14 (×2): qty 1

## 2023-07-14 MED ORDER — PANTOPRAZOLE SODIUM 40 MG PO TBEC
40.0000 mg | DELAYED_RELEASE_TABLET | Freq: Every day | ORAL | Status: DC
  Administered 2023-07-14 – 2023-07-20 (×6): 40 mg via ORAL
  Filled 2023-07-14 (×6): qty 1

## 2023-07-14 MED ORDER — SODIUM CHLORIDE 0.9% FLUSH
3.0000 mL | Freq: Two times a day (BID) | INTRAVENOUS | Status: DC
Start: 2023-07-14 — End: 2023-07-21
  Administered 2023-07-14 – 2023-07-19 (×8): 3 mL via INTRAVENOUS

## 2023-07-14 MED ORDER — LURASIDONE HCL 40 MG PO TABS
40.0000 mg | ORAL_TABLET | Freq: Every day | ORAL | Status: DC
  Administered 2023-07-15 – 2023-07-18 (×4): 40 mg via ORAL
  Filled 2023-07-14 (×5): qty 1

## 2023-07-14 MED ORDER — ONDANSETRON HCL 4 MG PO TABS: 4.0000 mg | ORAL_TABLET | Freq: Four times a day (QID) | ORAL | Status: DC | PRN

## 2023-07-14 MED ORDER — ONDANSETRON HCL 4 MG/2ML IJ SOLN: 4.0000 mg | Freq: Four times a day (QID) | INTRAMUSCULAR | Status: DC | PRN

## 2023-07-14 MED ORDER — FENOFIBRATE 160 MG PO TABS: 160.0000 mg | ORAL_TABLET | Freq: Every day | ORAL | Status: DC

## 2023-07-14 MED ORDER — ENOXAPARIN SODIUM 40 MG/0.4ML IJ SOSY
40.0000 mg | PREFILLED_SYRINGE | INTRAMUSCULAR | Status: DC
  Administered 2023-07-14 – 2023-07-18 (×5): 40 mg via SUBCUTANEOUS
  Filled 2023-07-14 (×5): qty 0.4

## 2023-07-14 MED ORDER — FUROSEMIDE 20 MG PO TABS: 40.0000 mg | ORAL_TABLET | Freq: Every day | ORAL | Status: DC

## 2023-07-14 MED ORDER — LACTULOSE 10 GM/15ML PO SOLN
30.0000 g | Freq: Three times a day (TID) | ORAL | Status: DC
  Administered 2023-07-14 – 2023-07-18 (×11): 30 g via ORAL
  Filled 2023-07-14 (×12): qty 45

## 2023-07-14 MED ORDER — LACTULOSE 10 GM/15ML PO SOLN
30.0000 g | Freq: Three times a day (TID) | ORAL | Status: DC
  Filled 2023-07-14: qty 45

## 2023-07-14 MED ORDER — LACTULOSE 10 GM/15ML PO SOLN: 30.0000 g | Freq: Two times a day (BID) | ORAL | Status: DC

## 2023-07-14 MED ORDER — ATORVASTATIN CALCIUM 10 MG PO TABS
20.0000 mg | ORAL_TABLET | ORAL | Status: DC
Start: 2023-07-16 — End: 2023-07-15

## 2023-07-14 MED ORDER — DIVALPROEX SODIUM 125 MG PO DR TAB
125.0000 mg | DELAYED_RELEASE_TABLET | Freq: Two times a day (BID) | ORAL | Status: DC
  Administered 2023-07-14 – 2023-07-15 (×3): 125 mg via ORAL
  Filled 2023-07-14 (×4): qty 1

## 2023-07-14 MED ORDER — OXYCODONE HCL 5 MG PO TABS
2.5000 mg | ORAL_TABLET | Freq: Three times a day (TID) | ORAL | Status: DC | PRN
  Administered 2023-07-15 – 2023-07-16 (×2): 2.5 mg via ORAL
  Filled 2023-07-14 (×2): qty 1

## 2023-07-14 MED ORDER — FUROSEMIDE 10 MG/ML IJ SOLN
40.0000 mg | Freq: Once | INTRAMUSCULAR | Status: AC
Start: 2023-07-14 — End: 2023-07-14
  Administered 2023-07-14: 40 mg via INTRAVENOUS
  Filled 2023-07-14: qty 4

## 2023-07-14 MED ORDER — ALBUTEROL SULFATE (2.5 MG/3ML) 0.083% IN NEBU
2.5000 mg | INHALATION_SOLUTION | Freq: Four times a day (QID) | RESPIRATORY_TRACT | Status: DC | PRN
  Filled 2023-07-14: qty 3

## 2023-07-14 MED ORDER — BENZTROPINE MESYLATE 0.5 MG PO TABS
0.5000 mg | ORAL_TABLET | Freq: Every day | ORAL | Status: DC
  Administered 2023-07-14 – 2023-07-19 (×5): 0.5 mg via ORAL
  Filled 2023-07-14 (×7): qty 1

## 2023-07-14 MED ORDER — LEVOTHYROXINE SODIUM 75 MCG PO TABS
75.0000 ug | ORAL_TABLET | Freq: Every day | ORAL | Status: DC
  Administered 2023-07-15 – 2023-07-18 (×4): 75 ug via ORAL
  Filled 2023-07-14 (×4): qty 1

## 2023-07-14 MED ORDER — OXYCODONE HCL 5 MG/5ML PO SOLN: 2.5000 mg | Freq: Three times a day (TID) | ORAL | Status: DC | PRN

## 2023-07-14 NOTE — ED Provider Notes (Signed)
 [place] EMERGENCY DEPARTMENT AT [facility] Provider Note   CSN: [id number] Arrival date & time: 07/14/23  1044     History  Chief Complaint  Patient presents with   Altered Mental Status    Madison Palmer is a 58 y.o. female.   This 58 year old female with prior medical history including stage IV lung cancer on chemotherapy and Keytruda last dose was on 05/29/2023, with history of diastolic CHF, hypertension, hyperlipidemia, sleep apnea, depression, hypothyroidism.  She presents from [facility] health care with EMS transport.  Patient with apparent increased confusion.  Patient is AO x 2 at baseline.  She is able to answer simple commands at baseline.  EMS is unsure of last known well.  Additional history obtained from patient's son.  He is now at bedside.  Patient's son reports that he was able to have a conversation with his mother yesterday  afternoon around 4 PM.  The patient's son reports that "sometimes my mother has confusion".  The history is provided by the patient, the EMS personnel, medical records and a relative.       Home Medications Prior to Admission medications   Medication Sig Start Date End Date Taking? Authorizing Provider  acetaminophen (TYLENOL) 500 MG tablet Take 1,000 mg by mouth 3 (three) times daily as needed for moderate pain or headache.    [provider]  amLODipine (NORVASC) 10 MG tablet TAKE 1 TABLET BY MOUTH EVERY DAY 04/10/23   Rollene Rotunda, MD  atorvastatin (LIPITOR) 20 MG tablet TAKE 1 TABLET(20 MG) BY MOUTH 3 TIMES A WEEK 04/17/23   [name], [name], NP  benztropine (COGENTIN) 0.5 MG tablet Take 1 tablet (0.5 mg total) by mouth at bedtime. 04/10/23 07/09/23  Arfeen, Phillips Grout, MD  cetirizine (ZYRTEC) 10 MG tablet TAKE 1 TABLET BY MOUTH EVERY DAY Patient not taking: Reported on 06/06/2023 12/21/22   [name], [name], NP  Cholecalciferol (VITAMIN D3 MAXIMUM STRENGTH) 125 MCG (5000 UT) capsule Take 5,000 Units by mouth daily.     [provider]  doxycycline (VIBRAMYCIN) 100 MG capsule Take 1 capsule (100 mg total) by mouth 2 (two) times daily. 06/29/23   Horton, Clabe Seal, DO  esomeprazole (NEXIUM) 40 MG capsule TAKE 1 CAPSULE BY MOUTH TWICE DAILY 01/30/23   Mansouraty, Netty Starring., MD  fenofibrate (TRICOR) 145 MG tablet TAKE 1 TABLET(145 MG) BY MOUTH DAILY 12/12/22   [name], [name], NP  folic acid (FOLVITE) 1 MG tablet TAKE 1 TABLET BY MOUTH DAILY 04/03/23   Heilingoetter, Cassandra L, PA-C  furosemide (LASIX) 40 MG tablet TAKE 1 TABLET BY MOUTH EVERY DAY 04/27/23   Rollene Rotunda, MD  gabapentin (NEURONTIN) 100 MG capsule TAKE 2 CAPSULES(200 MG) BY MOUTH AT BEDTIME 07/12/23   [name], [name], NP  labetalol (NORMODYNE) 300 MG tablet TAKE 1 AND 1/2 TABLETS BY MOUTH TWICE DAILY 07/10/23   Rollene Rotunda, MD  levothyroxine (SYNTHROID) 88 MCG tablet Take 88 mcg by mouth daily. 07/28/21   [provider]  lurasidone (LATUDA) 40 MG TABS tablet Take 1 tablet (40 mg total) by mouth daily with breakfast. 04/10/23   Arfeen, Phillips Grout, MD  nystatin (MYCOSTATIN/NYSTOP) powder Apply topically 3 (three) times daily. 06/14/23   Leatha Gilding, MD  Polyethylene Glycol 3350 (MIRALAX PO) Take 1 Capful by mouth daily as needed (Constipation).    [provider]  Potassium Chloride ER 20 MEQ TBCR TAKE 1 TABLET BY MOUTH DAILY 07/05/23   Heilingoetter, Cassandra L, PA-C  prochlorperazine (COMPAZINE) 10 MG tablet Take 1 tablet (10 mg total) by mouth every 6 (six) hours as needed. 09/28/22   Heilingoetter, Cassandra L, PA-C  TART CHERRY PO Take 1 tablet by mouth daily.    [provider]      Allergies    Other, Fentanyl, Levofloxacin, Midazolam, Pollen extract, Atorvastatin, Hydralazine hcl, and Rosuvastatin    Review of Systems   Review of Systems  All other systems reviewed and are negative.   Physical Exam Updated Vital Signs BP 122/81   Pulse 64   Temp (!) 96.4 F (35.8 C) (Tympanic)    Resp 14   LMP 05/02/2017   SpO2 100%  Physical Exam Vitals and nursing note reviewed.  Constitutional:      General: She is not in acute distress.    Appearance: Normal appearance. She is well-developed.  HENT:     Head: Normocephalic and atraumatic.     Mouth/Throat:     Mouth: Mucous membranes are dry.  Eyes:     Conjunctiva/sclera: Conjunctivae normal.     Pupils: Pupils are equal, round, and reactive to light.  Cardiovascular:     Rate and Rhythm: Normal rate and regular rhythm.     Heart sounds: Normal heart sounds.  Pulmonary:     Effort: Pulmonary effort is normal. No respiratory distress.     Breath sounds: Normal breath sounds.  Abdominal:     General: There is no distension.     Palpations: Abdomen is soft.     Tenderness: There is no abdominal tenderness.  Musculoskeletal:        General: No deformity. Normal range of motion.     Cervical back: Normal range of motion and neck supple.  Skin:    General: Skin is warm and dry.  Neurological:     General: No focal deficit present.     Mental Status: She is alert.     Comments: Somnolent but arousable.  Mumbles incomprehensibly when asked questions.  Moves all 4 extremities.     ED Results / Procedures / Treatments   Labs (all labs ordered are listed, but only abnormal results are displayed) Labs Reviewed  CULTURE, BLOOD (ROUTINE X 2)  CULTURE, BLOOD (ROUTINE X 2)  RESP PANEL BY RT-PCR (RSV, FLU A&B, COVID)  [id number]  CBC WITH DIFFERENTIAL/PLATELET  COMPREHENSIVE METABOLIC PANEL  ETHANOL  URINALYSIS, W/ REFLEX TO CULTURE (INFECTION SUSPECTED)  RAPID URINE DRUG SCREEN, HOSP PERFORMED  PROTIME-INR  LIPASE, BLOOD  CK  AMMONIA  BRAIN NATRIURETIC PEPTIDE  I-STAT ARTERIAL BLOOD GAS, ED  I-STAT CHEM 8, ED  I-STAT CG4 LACTIC ACID, ED  TROPONIN I (HIGH SENSITIVITY)    EKG EKG Interpretation Date/Time:  Saturday July 14 2023 11:03:52 EDT Ventricular Rate:  66 PR Interval:  150 QRS Duration:  82 QT  Interval:  438 QTC Calculation: 459 R Axis:   37  Text Interpretation: Normal sinus rhythm Right atrial enlargement ST & T wave abnormality, consider inferior ischemia Abnormal ECG When compared with ECG of 07-Jun-2023 13:50, PREVIOUS ECG IS PRESENT Confirmed by Kristine Royal [phone]) on 07/14/2023 11:05:12 AM  Radiology No results found.  Procedures Procedures    Medications Ordered in ED Medications - No data to display  ED Course/ Medical Decision Making/ A&P                                 Medical Decision Making Amount and/or Complexity  of Data Reviewed Labs: ordered. Radiology: ordered.    Medical Screen Complete  This patient presented to the ED with complaint of AMS.  This complaint involves an extensive number of treatment options. The initial differential diagnosis includes, but is not limited to, metabolic abnormality, intracranial pathology, delirium, encephalopathy, etc.  This presentation is: Acute, Chronic, Self-Limited, Previously Undiagnosed, Uncertain Prognosis, Complicated, Systemic Symptoms, and Threat to Life/Bodily Function  58 year old female with multiple comorbidities presents with apparent alteration in mental status.  Patient is nonfocal.  Last known well is unclear.  Presentation is most consistent with likely encephalopathy.  Initial labs are remarkable for white count of 10.7, hemoglobin 10.2, platelet count 132, ammonia of 84, creatinine of 1.08, BUN of 24, sodium of 142.  Lipase is 130.  Troponin is 56.  COVID and flu testing is negative.  UA is pending.  CT head is without acute abnormality.  Chest x-ray is without acute abnormality.  Patient would benefit from admission for further workup and treatment.  Hospitalist service made aware of case and will evaluate for same.  Co morbidities that complicated the patient's evaluation  See HPI   Additional history obtained:  Additional history obtained from [facility] External records from  outside sources obtained and reviewed including prior ED visits and prior Inpatient records.    Lab Tests:  I ordered and personally interpreted labs.  The pertinent results include: CBC, CMP, BNP, ammonia, CK, lipase, troponin, VBG, lactic acid, UA   Imaging Studies ordered:  I ordered imaging studies including CT head, chest x-ray I independently visualized and interpreted obtained imaging which showed NAD I agree with the radiologist interpretation.   Cardiac Monitoring:  The patient was maintained on a cardiac monitor.  I personally viewed and interpreted the cardiac monitor which showed an underlying rhythm of: NSR  Problem List / ED Course:  AMS   Reevaluation:  After the interventions noted above, I reevaluated the patient and found that they have: stayed the same   Disposition:  After consideration of the diagnostic results and the patients response to treatment, I feel that the patent would benefit from admission.   CRITICAL CARE Performed by: Wynetta Fines   Total critical care time: 30 minutes  Critical care time was exclusive of separately billable procedures and treating other patients.  Critical care was necessary to treat or prevent imminent or life-threatening deterioration.  Critical care was time spent personally by me on the following activities: development of treatment plan with patient and/or surrogate as well as nursing, discussions with consultants, evaluation of patient's response to treatment, examination of patient, obtaining history from patient or surrogate, ordering and performing treatments and interventions, ordering and review of laboratory studies, ordering and review of radiographic studies, pulse oximetry and re-evaluation of patient's condition.          Final Clinical Impression(s) / ED Diagnoses Final diagnoses:  Altered mental status, unspecified altered mental status type    Rx / DC Orders ED Discharge Orders      None         Wynetta Fines, MD 07/14/23 1342

## 2023-07-14 NOTE — H&P (Addendum)
 History and Physical    Patient: Madison Palmer:[id number] DOB: 1965-07-14 DOA: 07/14/2023 DOS: the patient was seen and examined on 07/14/2023 PCP: Anne Ng, NP  Patient coming from: [facility] healthcare via EMS  Chief Complaint:  Chief Complaint  Patient presents with   Altered Mental Status   HPI: Madison Palmer is a 58 y.o. female with medical history significant of stage IV lung cancer  ., diastolic CHF, hypertension, hyperlipidemia, sleep apnea, depression, hypothyroidism, and obesity presents with altered mental status and lethargy. She is accompanied by her son and a long-term friend.  This is the second occurrence of altered mental status and lethargy. Previously, she was alert and oriented, but today she is less responsive, only managing to say a few words. She has a history of bipolar disorder, which sometimes affects her mental state.  She has cancer with metastases to the liver, leading to compromised liver function and elevated ammonia levels, contributing to her lethargy. She completed her last chemotherapy session on January 28th and is no longer undergoing chemotherapy. Her follow-up involves regular check-ups every three weeks at the cancer center.  Her current medications include doxycycline, taken twice a day for seven days, and potassium supplements. There is a mention of lactulose as a potential treatment to help clear toxins due to liver dysfunction, but it is unclear if she has started this medication yet.  Her son mentioned that she was in the process of transitioning to hospice care before this incident occurred. She has a do not resuscitate (DNR) order in place.  Over the phone conversation with her husband confirms CODE STATUS.   Upon admission into the emergency department patient was noted to be afebrile with stable vital signs.  Labs noted WBC 10.7, hemoglobin 10.9, BUN 24, creatinine 1.08, lactic acid 1.2-> 0.7, BNP 175.5, CK 34, and troponin 56.   His pH noted to be 7.54 with pCO2 55.7 and pO2 98.  Chest x-ray noted bilateral pleural effusions and retrocardiac space disease similar to prior.CT scan of the head did not note any acute abnormality, partially empty sella, and paranasal sinus disease.  TRH called to admit.   Review of Systems: unable to review all systems due to the inability of the patient to answer questions. Past Medical History:  Diagnosis Date   Acute on chronic respiratory failure with hypoxia and hypercapnia (HCC) 09/15/2018   Anemia    Anxiety    Arrhythmia    tachycardia   Arthritis    Bipolar 1 disorder (HCC) 09/12/2018   Bipolar I disorder, current or most recent episode manic, with psychotic features (HCC)    Brief psychotic disorder (HCC) 08/22/2018   Chronic respiratory failure with hypoxia (HCC) 05/10/2018   Formatting of this note might be different from the original. Last Assessment & Plan:  Continue 1 L continuous on exertion and during sleep. On her next visit we will check OSA on CPAP/room air to decide whether she needs to take oxygen along with her on her cruise in May   Depression    Diverticulitis    Generalized edema 03/12/2018   GERD (gastroesophageal reflux disease)    Glaucoma    Hyperlipidemia    Hyperparathyroidism (HCC)    Hypertension    Inflammatory polyps of colon (HCC)    Leg swelling 01/14/2019   LVH (left ventricular hypertrophy)    Lymphedema    Morbid (severe) obesity due to excess calories (HCC) 05/11/2017   PCOS (polycystic ovarian syndrome)  Peripheral vascular disease (HCC)    Prediabetes    Recurrent UTI    Sleep apnea    CPAP   Thyroid disease    Vitamin D deficiency    Past Surgical History:  Procedure Laterality Date   BIOPSY  09/27/2022   Procedure: BIOPSY;  Surgeon: Meridee Score Netty Starring., MD;  Location: [facility] ENDOSCOPY;  Service: Gastroenterology;;   BREAST BIOPSY  2015   CESAREAN SECTION  2004   CHOLECYSTECTOMY     COLONOSCOPY WITH PROPOFOL N/A  09/27/2022   Procedure: COLONOSCOPY WITH PROPOFOL;  Surgeon: Lemar Lofty., MD;  Location: [facility] ENDOSCOPY;  Service: Gastroenterology;  Laterality: N/A;   ESOPHAGOGASTRODUODENOSCOPY (EGD) WITH PROPOFOL N/A 09/27/2022   Procedure: ESOPHAGOGASTRODUODENOSCOPY (EGD) WITH PROPOFOL;  Surgeon: Meridee Score Netty Starring., MD;  Location[facility] ENDOSCOPY;  Service: Gastroenterology;  Laterality: N/A;   INNER EAR SURGERY     ear and sinus surgery   LAPAROSCOPIC REPAIR AND REMOVAL OF GASTRIC BAND     MALONEY DILATION  09/27/2022   Procedure: MALONEY DILATION;  Surgeon: Lemar Lofty., MD;  Location: [facility] ENDOSCOPY;  Service: Gastroenterology;;   OOPHORECTOMY Left    POLYPECTOMY  09/27/2022   Procedure: POLYPECTOMY;  Surgeon: Lemar Lofty., MD;  Location: [facility] ENDOSCOPY;  Service: Gastroenterology;;   TONSILLECTOMY AND ADENOIDECTOMY     Social History:  reports that she has never smoked. She has never used smokeless tobacco. She reports current alcohol use. She reports that she does not use drugs.  Allergies  Allergen Reactions   Other Other (See Comments)    Hydralazine Hcl Other reaction(s): Other (See Comments) Instructed not to take by Cardiology.   Fentanyl Hives    Other reaction(s): hives had fentanyl recently with benadryl, did OK   Levofloxacin Hives   Midazolam Hives   Pollen Extract     seasonal   Atorvastatin Other (See Comments)    Muscle pain in legs  Abdominal pain   Hydralazine Hcl Other (See Comments)    Hypercalcemia    Rosuvastatin Other (See Comments)    Abdominal pain Other reaction(s): diarrhea    Family History  Adopted: Yes  Problem Relation Age of Onset   Hearing loss Son        right    Healthy Son    Breast cancer Neg Hx     Prior to Admission medications   Medication Sig Start Date End Date Taking? Authorizing Provider  acetaminophen (TYLENOL) 500 MG tablet Take 1,000 mg by mouth 3 (three) times daily as needed for moderate pain or  headache.    [provider]  amLODipine (NORVASC) 10 MG tablet TAKE 1 TABLET BY MOUTH EVERY DAY 04/10/23   Rollene Rotunda, MD  atorvastatin (LIPITOR) 20 MG tablet TAKE 1 TABLET(20 MG) BY MOUTH 3 TIMES A WEEK 04/17/23   [name], [name], NP  benztropine (COGENTIN) 0.5 MG tablet Take 1 tablet (0.5 mg total) by mouth at bedtime. 04/10/23 07/09/23  Arfeen, Phillips Grout, MD  cetirizine (ZYRTEC) 10 MG tablet TAKE 1 TABLET BY MOUTH EVERY DAY Patient not taking: Reported on 06/06/2023 12/21/22   [name], [name], NP  Cholecalciferol (VITAMIN D3 MAXIMUM STRENGTH) 125 MCG (5000 UT) capsule Take 5,000 Units by mouth daily.    [provider]  doxycycline (VIBRAMYCIN) 100 MG capsule Take 1 capsule (100 mg total) by mouth 2 (two) times daily. 06/29/23   Horton, Clabe Seal, DO  esomeprazole (NEXIUM) 40 MG capsule TAKE 1 CAPSULE BY MOUTH TWICE DAILY 01/30/23   Mansouraty,  Netty Starring., MD  fenofibrate (TRICOR) 145 MG tablet TAKE 1 TABLET(145 MG) BY MOUTH DAILY 12/12/22   [name], [name], NP  folic acid (FOLVITE) 1 MG tablet TAKE 1 TABLET BY MOUTH DAILY 04/03/23   Heilingoetter, Cassandra L, PA-C  furosemide (LASIX) 40 MG tablet TAKE 1 TABLET BY MOUTH EVERY DAY 04/27/23   Rollene Rotunda, MD  gabapentin (NEURONTIN) 100 MG capsule TAKE 2 CAPSULES(200 MG) BY MOUTH AT BEDTIME 07/12/23   [name], [name], NP  labetalol (NORMODYNE) 300 MG tablet TAKE 1 AND 1/2 TABLETS BY MOUTH TWICE DAILY 07/10/23   Rollene Rotunda, MD  levothyroxine (SYNTHROID) 88 MCG tablet Take 88 mcg by mouth daily. 07/28/21   [provider]  lurasidone (LATUDA) 40 MG TABS tablet Take 1 tablet (40 mg total) by mouth daily with breakfast. 04/10/23   Arfeen, Phillips Grout, MD  nystatin (MYCOSTATIN/NYSTOP) powder Apply topically 3 (three) times daily. 06/14/23   Leatha Gilding, MD  Polyethylene Glycol 3350 (MIRALAX PO) Take 1 Capful by mouth daily as needed (Constipation).    [provider]  Potassium Chloride ER 20 MEQ  TBCR TAKE 1 TABLET BY MOUTH DAILY 07/05/23   Heilingoetter, Cassandra L, PA-C  prochlorperazine (COMPAZINE) 10 MG tablet Take 1 tablet (10 mg total) by mouth every 6 (six) hours as needed. 09/28/22   Heilingoetter, Cassandra L, PA-C  TART CHERRY PO Take 1 tablet by mouth daily.    [provider]    Physical Exam: Vitals:   07/14/23 1058 07/14/23 1111  BP: 122/81   Pulse: 64   Resp: 14   Temp: (!) 96.4 F (35.8 C)   TempSrc: Tympanic   SpO2: 100% 100%    Constitutional: Ill appearing obese middle-aged female who is lethargic and not readily following commands Eyes: PERRL, lids and conjunctivae normal ENMT: Mucous membranes are moist. Fair dentition Neck: normal, supple, no masses, no thyromegaly Respiratory: clear to auscultation bilaterally, no wheezing, no crackles. Normal respiratory effort.  Currently on 2 L of nasal cannula oxygen. Cardiovascular: Regular rate and rhythm, no murmurs / rubs / gallops.  +1 pitting edema.   Abdomen: no tenderness, no masses palpated. Bowel sounds positive.  Musculoskeletal: no clubbing / cyanosis. No joint deformity upper and lower extremities. Good ROM, no contractures. Normal muscle tone.  Skin: no rashes, lesions, ulcers. Neurologic: CN 2-12 grossly intact.  Appears able to move all extremities.Marland Kitchen  Psychiatric: Lethargic unable to assess orientation at this time.  Data Reviewed:  EKG revealed normal sinus rhythm at 66 bpm with right atrial enlargement, and nonspecific ST wave changes.  Reviewed labs, imaging, and pertinent records as documented. Assessment and Plan:  Suspect hepatic encephalopathy Metastatic liver disease Patient to be acutely altered with less responsiveness than normal.  Report to be alert and oriented x 2 at baseline.  CT scan of head did not note any acute abnormality.  Labs reveal ammonia level elevated at 84.  Patient has known liver metastases, which could be the cause of symptoms. -Admit to a medical telemetry  bed -Aspiration precautions with elevation head of bed -NGT placement -Lactulose 30 mg 3 times daily prior to -Attempt to limit sedating medications  Adenocarcinoma of the lung stage IV Patient has known stage IV lung cancer previously treated with chemotherapy, but last treatment was back in January.  Family notes the patient was in the process of being transitioned to hospice.  Patient husband to be back in town by Monday. -Transitions of care consulted  Diastolic congestive heart  failure Possibly acute on chronic.  On physical exam patient with at least 1+ pitting lower extremity edema.  BNP elevated at 175.5.  Chest x-ray noted bilateral pleural effusions and retrocardiac space disease similar to prior study. -Strict I&O's and daily weights -Lasix 40 mg IV x 1 dose.  Reassess and determine further need of IV diuresis in a.m. otherwise resume oral dose  Hypertension Blood pressures currently maintained. -Continue amlodipine  -Consider resuming labetalol once medically appropriate  Chronic kidney disease stage IIIa Creatinine noted to be 1.08 with BUN 24.  Baseline creatinine previously noted to be higher. -Continue to monitor kidney function with diuresis  Hyperlipidemia associated with diabetes mellitus type 2   Patient not on medications for treatment of diabetes.  Last available hemoglobin A1c noted to be 6 when checked on 02/28/2023. -Continue atorvastatin and fenofibrate  Mood disorder Depression -Continue current medication regimen  Hypothyroidism TSH 0.907 when checked on 06/26/2023. -Continue levothyroxine once at  GERD -Continue Protonix  DVT prophylaxis: Lovenox Advance Care Planning:   Code Status: Do not attempt resuscitation (DNR) PRE-ARREST INTERVENTIONS DESIRED    Consults: None  Family Communication: Son and long-term friend updated at bedside.  Patient's husband updated over the phone.  Severity of Illness: The appropriate patient status for this  patient is INPATIENT. Inpatient status is judged to be reasonable and necessary in order to provide the required intensity of service to ensure the patient's safety. The patient's presenting symptoms, physical exam findings, and initial radiographic and laboratory data in the context of their chronic comorbidities is felt to place them at high risk for further clinical deterioration. Furthermore, it is not anticipated that the patient will be medically stable for discharge from the hospital within 2 midnights of admission.   * I certify that at the point of admission it is my clinical judgment that the patient will require inpatient hospital care spanning beyond 2 midnights from the point of admission due to high intensity of service, high risk for further deterioration and high frequency of surveillance required.*  Author: Clydie Braun, MD 07/14/2023 1:41 PM  For on call review [email or web address].

## 2023-07-14 NOTE — ED Notes (Signed)
 Pt

## 2023-07-14 NOTE — ED Triage Notes (Signed)
 Pt bib ems from [facility] c/o AMS. The staff are unsure pt LKW and current tx. Pt had bloodwork completed at facility today. The pt has hypernatremia. Pt was given 800 cc D5W from facility.  Pt has a PICC line in right bicep. Facility u/t produce lab work.  BP 127/80 HR 68 CBG 150 RA 100%

## 2023-07-14 NOTE — ED Notes (Signed)
 Pt has a bruise on left lateral bicep.

## 2023-07-14 NOTE — ED Notes (Signed)
 RN spoke with family.   Pt LKW 1630 07/13/2023. Pt baseline AOX4 and transfer with assistance. Pt was at facility for rehabilitation. Pt has PICC line that was used for chemo but it's has stopped. Pt family informed of pt location. Family will be up here later.

## 2023-07-15 DIAGNOSIS — K7682 Hepatic encephalopathy: Secondary | ICD-10-CM | POA: Diagnosis not present

## 2023-07-15 LAB — CBC
HCT: 35 % — ABNORMAL LOW (ref 36.0–46.0)
Hemoglobin: 11.1 g/dL — ABNORMAL LOW (ref 12.0–15.0)
MCH: 34 pg (ref 26.0–34.0)
MCHC: 31.7 g/dL (ref 30.0–36.0)
MCV: 107.4 fL — ABNORMAL HIGH (ref 80.0–100.0)
Platelets: 152 10*3/uL (ref 150–400)
RBC: 3.26 MIL/uL — ABNORMAL LOW (ref 3.87–5.11)
RDW: 15.9 % — ABNORMAL HIGH (ref 11.5–15.5)
WBC: 12.2 10*3/uL — ABNORMAL HIGH (ref 4.0–10.5)
nRBC: 0 % (ref 0.0–0.2)

## 2023-07-15 LAB — GLUCOSE, CAPILLARY: Glucose-Capillary: 88 mg/dL (ref 70–99)

## 2023-07-15 LAB — COMPREHENSIVE METABOLIC PANEL
ALT: 50 U/L — ABNORMAL HIGH (ref 0–44)
AST: 72 U/L — ABNORMAL HIGH (ref 15–41)
Albumin: 2.7 g/dL — ABNORMAL LOW (ref 3.5–5.0)
Alkaline Phosphatase: 130 U/L — ABNORMAL HIGH (ref 38–126)
Anion gap: 15 (ref 5–15)
BUN: 24 mg/dL — ABNORMAL HIGH (ref 6–20)
CO2: 35 mmol/L — ABNORMAL HIGH (ref 22–32)
Calcium: 10.4 mg/dL — ABNORMAL HIGH (ref 8.9–10.3)
Chloride: 93 mmol/L — ABNORMAL LOW (ref 98–111)
Creatinine, Ser: 1.22 mg/dL — ABNORMAL HIGH (ref 0.44–1.00)
GFR, Estimated: 52 mL/min — ABNORMAL LOW (ref >60)
Glucose, Bld: 95 mg/dL (ref 70–99)
Potassium: 4 mmol/L (ref 3.5–5.1)
Sodium: 143 mmol/L (ref 135–145)
Total Bilirubin: 2.1 mg/dL — ABNORMAL HIGH (ref 0.0–1.2)
Total Protein: 5.6 g/dL — ABNORMAL LOW (ref 6.5–8.1)

## 2023-07-15 MED ORDER — HALOPERIDOL LACTATE 5 MG/ML IJ SOLN
5.0000 mg | Freq: Four times a day (QID) | INTRAMUSCULAR | Status: DC | PRN
  Administered 2023-07-15: 5 mg via INTRAVENOUS
  Filled 2023-07-15: qty 1

## 2023-07-15 MED ORDER — HALOPERIDOL LACTATE 5 MG/ML IJ SOLN
3.0000 mg | Freq: Once | INTRAMUSCULAR | Status: AC
  Administered 2023-07-15: 3 mg via INTRAVENOUS
  Filled 2023-07-15: qty 1

## 2023-07-15 MED ORDER — SODIUM CHLORIDE 0.9% FLUSH
10.0000 mL | Freq: Two times a day (BID) | INTRAVENOUS | Status: DC
  Administered 2023-07-15 – 2023-07-17 (×3): 10 mL
  Administered 2023-07-20: 20 mL

## 2023-07-15 MED ORDER — ACETAMINOPHEN 325 MG PO TABS
325.0000 mg | ORAL_TABLET | Freq: Four times a day (QID) | ORAL | Status: DC | PRN
  Administered 2023-07-17: 325 mg via ORAL
  Filled 2023-07-15: qty 1

## 2023-07-15 MED ORDER — HALOPERIDOL LACTATE 5 MG/ML IJ SOLN
2.0000 mg | Freq: Once | INTRAMUSCULAR | Status: AC
  Administered 2023-07-15: 2 mg via INTRAVENOUS
  Filled 2023-07-15: qty 1

## 2023-07-15 MED ORDER — CHLORHEXIDINE GLUCONATE CLOTH 2 % EX PADS
6.0000 | MEDICATED_PAD | Freq: Every day | CUTANEOUS | Status: DC
  Administered 2023-07-15 – 2023-07-19 (×5): 6 via TOPICAL

## 2023-07-15 MED ORDER — SODIUM CHLORIDE 0.9% FLUSH: 10.0000 mL | INTRAVENOUS | Status: DC | PRN

## 2023-07-15 NOTE — Plan of Care (Addendum)
 Patient alert and confused. Unable to express needs. Purewick in place. Midline to RUA. Patient restless today, haldol ordered and given with no effect. PRN oxycodone given with no effect. Dressing to sacrum changed. Total feed, patient had small amounts of lunch and dinner this shift. Safety precautions maintained.    Problem: Pain Managment: Goal: General experience of comfort will improve and/or be controlled Outcome: Progressing   Problem: Education: Goal: Knowledge of General Education information will improve Description: Including pain rating scale, medication(s)/side effects and non-pharmacologic comfort measures Outcome: Not Progressing   Problem: Activity: Goal: Risk for activity intolerance will decrease Outcome: Not Progressing

## 2023-07-15 NOTE — Progress Notes (Signed)
 Madison Palmer  QMV:[id number] DOB: [birth year] DOA: 07/14/2023 PCP: Anne Ng, NP    Brief Narrative:  58 year old with a history of stage IV lung cancer metastatic to the liver, bipolar disorder, diastolic CHF, HTN, HLD, depression, hypothyroidism, sleep apnea, and obesity who presented to the ER 3/15 with altered mental status and lethargy.  At baseline she is alert oriented and conversant but on the day of presentation the patient's family noted she was minimally responsive and could only summon a few words occasionally.  In the ER CT head was without acute findings.  CXR noted stable bilateral pleural effusions.  She was afebrile with a normal WBC.  Ammonia was elevated at 84.  UA was equivocal.  Goals of Care:   Code Status: Do not attempt resuscitation (DNR) PRE-ARREST INTERVENTIONS DESIRED   DVT prophylaxis: enoxaparin (LOVENOX) injection 40 mg Start: 07/14/23 1400   Interim Hx: Afebrile since admission.  Some mild persisting sinus tachycardia with heart rate 102-111.  Blood pressure stable.  Saturations stable.  At the time of my visit the patient does not appear to be any medical distress but she is holding her left arm up in the air and screaming out steadily.  She stops screaming when I speak to her.  She will respond to many of my questions appropriately.  She denies hurting or significant distress.  As soon as we quit talking she begins to scream out again.  She is confused and is unable to tell me where she is why she is here or what is going on.  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 Completed last  chemotherapy treatment in January 2025 -plan at present has been to transition to hospice care  Chronic diastolic CHF with mild acute exacerbation Gently diurese and monitor  HTN BP presently reasonably controlled  CKD stage IIIa Creatinine presently stable  HLD Likely little benefit to be gained from ongoing use of medical therapy at this time - discontinue medication for this issue  Bipolar disorder -depression Continue usual home medication regimen when patient more alert  Hypothyroidism Continue usual Synthroid dose  Equivocal UA At admission UA positive for leukocyte esterase and nitrite but 0-5 WBC -no known history of recent dysuria but of course confusion itself could be a symptom of a UTI - if her mental status fails to improve we will give a short course of antibiotic therapy empirically  Obesity    Family Communication: No family present at time of exam Disposition: Unclear at this time   Objective: Blood pressure 129/85, pulse (!) 106, temperature 98.2 F (36.8 C), resp. rate 18, last menstrual period 05/02/2017, SpO2 91%.  Intake/Output Summary (Last 24 hours) at 07/15/2023 0814 Last data filed at 07/14/2023 2100 Gross per 24 hour  Intake 0 ml  Output 0 ml  Net 0 ml   There were no vitals filed for this visit.  Examination: General: No acute respiratory distress Lungs: Clear to auscultation bilaterally without wheezes or crackles Cardiovascular: Regular rate and rhythm without murmur gallop or rub normal S1 and S2 Abdomen: Nontender, nondistended, soft, bowel  sounds positive, no rebound, no ascites, no appreciable mass Extremities: No significant cyanosis, clubbing, or edema bilateral lower extremities  CBC: Recent Labs  Lab 07/14/23 1138 07/14/23 1151 07/15/23 0256  WBC 10.7*  --  12.2*  NEUTROABS 9.6*  --   --   HGB 10.2* 10.9*  11.2* 11.1*  HCT 33.3* 32.0*  33.0* 35.0*  MCV 110.3*  --  107.4*  PLT 132*  --  152   Basic Metabolic  Panel: Recent Labs  Lab 07/14/23 1138 07/14/23 1151 07/15/23 0256  NA 142 139  138 143  K 4.4 4.2  4.2 4.0  CL 93* 94* 93*  CO2 35*  --  35*  GLUCOSE 133* 126* 95  BUN 24* 30* 24*  CREATININE 1.08* 1.40* 1.22*  CALCIUM 10.1  --  10.4*   GFR: CrCl cannot be calculated (Unknown ideal weight.).   Scheduled Meds:  amLODipine  10 mg Oral Daily   [START ON 07/16/2023] atorvastatin  20 mg Oral Once per day on Monday Wednesday Friday   benztropine  0.5 mg Oral QHS   Chlorhexidine Gluconate Cloth  6 each Topical Daily   divalproex  125 mg Oral BID   enoxaparin (LOVENOX) injection  40 mg Subcutaneous Q24H   fenofibrate  160 mg Per Tube Daily   furosemide  40 mg Oral Daily   lactulose  30 g Oral TID   levothyroxine  75 mcg Oral Q0600   lurasidone  40 mg Oral Q breakfast   pantoprazole  40 mg Oral Daily   sodium chloride flush  10-40 mL Intracatheter Q12H   sodium chloride flush  3 mL Intravenous Q12H     LOS: 1 day   Lonia Blood, MD [facility] Office  [phone] Pager - Text Page per Amion  If 7PM-7AM, please contact night-coverage per Amion 07/15/2023, 8:14 AM

## 2023-07-16 ENCOUNTER — Other Ambulatory Visit: Payer: Self-pay | Admitting: Physician Assistant

## 2023-07-16 DIAGNOSIS — C3492 Malignant neoplasm of unspecified part of left bronchus or lung: Secondary | ICD-10-CM

## 2023-07-16 DIAGNOSIS — K7682 Hepatic encephalopathy: Secondary | ICD-10-CM | POA: Diagnosis not present

## 2023-07-16 LAB — COMPREHENSIVE METABOLIC PANEL
ALT: 77 U/L — ABNORMAL HIGH (ref 0–44)
AST: 90 U/L — ABNORMAL HIGH (ref 15–41)
Albumin: 2.7 g/dL — ABNORMAL LOW (ref 3.5–5.0)
Alkaline Phosphatase: 140 U/L — ABNORMAL HIGH (ref 38–126)
Anion gap: 10 (ref 5–15)
BUN: 19 mg/dL (ref 6–20)
CO2: 40 mmol/L — ABNORMAL HIGH (ref 22–32)
Calcium: 10.1 mg/dL (ref 8.9–10.3)
Chloride: 94 mmol/L — ABNORMAL LOW (ref 98–111)
Creatinine, Ser: 1.24 mg/dL — ABNORMAL HIGH (ref 0.44–1.00)
GFR, Estimated: 51 mL/min — ABNORMAL LOW (ref >60)
Glucose, Bld: 171 mg/dL — ABNORMAL HIGH (ref 70–99)
Potassium: 3.5 mmol/L (ref 3.5–5.1)
Sodium: 144 mmol/L (ref 135–145)
Total Bilirubin: 2 mg/dL — ABNORMAL HIGH (ref 0.0–1.2)
Total Protein: 5.9 g/dL — ABNORMAL LOW (ref 6.5–8.1)

## 2023-07-16 LAB — CBC
HCT: 36.7 % (ref 36.0–46.0)
Hemoglobin: 11.6 g/dL — ABNORMAL LOW (ref 12.0–15.0)
MCH: 33.9 pg (ref 26.0–34.0)
MCHC: 31.6 g/dL (ref 30.0–36.0)
MCV: 107.3 fL — ABNORMAL HIGH (ref 80.0–100.0)
Platelets: 160 10*3/uL (ref 150–400)
RBC: 3.42 MIL/uL — ABNORMAL LOW (ref 3.87–5.11)
RDW: 16.3 % — ABNORMAL HIGH (ref 11.5–15.5)
WBC: 13.6 10*3/uL — ABNORMAL HIGH (ref 4.0–10.5)
nRBC: 0.1 % (ref 0.0–0.2)

## 2023-07-16 LAB — AMMONIA: Ammonia: 36 umol/L — ABNORMAL HIGH (ref 9–35)

## 2023-07-16 LAB — FOLATE: Folate: 20.7 ng/mL (ref >5.9)

## 2023-07-16 LAB — VITAMIN B12: Vitamin B-12: 2650 pg/mL — ABNORMAL HIGH (ref 180–914)

## 2023-07-16 MED ORDER — LORAZEPAM 2 MG/ML IJ SOLN
1.0000 mg | Freq: Once | INTRAMUSCULAR | Status: AC
  Administered 2023-07-16: 1 mg via INTRAVENOUS
  Filled 2023-07-16: qty 1

## 2023-07-16 MED ORDER — SODIUM CHLORIDE 0.9 % IV SOLN
1.0000 g | INTRAVENOUS | Status: AC
  Administered 2023-07-16 – 2023-07-18 (×3): 1 g via INTRAVENOUS
  Filled 2023-07-16 (×3): qty 10

## 2023-07-16 MED ORDER — OLANZAPINE 10 MG IM SOLR
10.0000 mg | Freq: Once | INTRAMUSCULAR | Status: AC
  Administered 2023-07-16: 10 mg via INTRAMUSCULAR
  Filled 2023-07-16: qty 10

## 2023-07-16 MED ORDER — STERILE WATER FOR INJECTION IJ SOLN
INTRAMUSCULAR | Status: AC
  Filled 2023-07-16: qty 10

## 2023-07-16 NOTE — Progress Notes (Addendum)
 Madison Palmer  WUJ:[id number] DOB: Apr 01, 1966 DOA: 07/14/2023 PCP: Anne Ng, NP    Brief Narrative:  58 year old with a history of stage IV lung cancer metastatic to the liver, bipolar disorder, diastolic CHF, HTN, HLD, depression, hypothyroidism, sleep apnea, and obesity who presented to the ER 3/15 with altered mental status and lethargy.  At baseline she is alert oriented and conversant but on the day of presentation the patient's family noted she was minimally responsive and could only summon a few words occasionally.  In the ER CT head was without acute findings.  CXR noted stable bilateral pleural effusions.  She was afebrile with a normal WBC.  Ammonia was elevated at 84.  UA was equivocal.  Goals of Care:   Code Status: Do not attempt resuscitation (DNR) PRE-ARREST INTERVENTIONS DESIRED   DVT prophylaxis: enoxaparin (LOVENOX) injection 40 mg Start: 07/14/23 1400   Interim Hx: The patient has remained agitated and extremely anxious for the past 24 hours despite doses of Haldol.  She is requiring total care but she will consume some of her meals with significant help.  She is afebrile.  Her ammonia level has normalized.  At the time of my visit today, after being dosed with Ativan and Zyprexa, the patient is finally resting comfortably.  She is in no respiratory distress.  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 Completed last chemotherapy treatment in January 2025 - plan at present has been to transition to hospice care - her  Oncologist has been added to the treatment team for awareness  Chronic diastolic CHF with mild acute exacerbation Continue to gently diurese and monitor  HTN No adjustment in treatment plan today  CKD stage IIIa Creatinine presently stable  HLD Likely little benefit to be gained from ongoing use of medical therapy at this time - discontinue medication for this issue  Bipolar disorder - depression Continue usual home medication regimen when patient more alert  Hypothyroidism Continue usual Synthroid dose  Equivocal UA At admission UA positive for leukocyte esterase and nitrite but 0-5 WBC -no known history of recent dysuria but of course confusion itself could be a symptom of a UTI -given her failure to improve from a delirium standpoint I will dose her empirically with a short course of antibiotic and monitor for improvement  Obesity    Family Communication: No family present at time of exam Disposition: Unclear at this time   Objective: Blood pressure (!) 157/125, pulse (!) 109, temperature (!) 97.4 F (36.3 C), resp. rate 18, last menstrual period 05/02/2017, SpO2 93%.  Intake/Output Summary (Last 24 hours) at 07/16/2023 0837 Last data filed at 07/16/2023 0450 Gross per 24 hour  Intake --  Output 1500 ml  Net -1500 ml   There were no vitals filed for this visit.  Examination: General: No acute respiratory distress -sedate/calm Lungs: Clear to auscultation bilaterally without wheezes or crackles Cardiovascular: Regular rate and rhythm without murmur gallop or rub normal S1 and S2 Abdomen: Nontender, nondistended,  soft, bowel sounds positive, no rebound, no ascites, no appreciable mass Extremities: No significant cyanosis, clubbing, or edema bilateral lower extremities  CBC: Recent Labs  Lab 07/14/23 1138 07/14/23 1151 07/15/23 0256 07/16/23 0329  WBC 10.7*  --  12.2* 13.6*  NEUTROABS 9.6*  --   --   --   HGB 10.2* 10.9*  11.2* 11.1* 11.6*  HCT 33.3* 32.0*   33.0* 35.0* 36.7  MCV 110.3*  --  107.4* 107.3*  PLT 132*  --  152 160   Basic Metabolic Panel: Recent Labs  Lab 07/14/23 1138 07/14/23 1151 07/15/23 0256 07/16/23 0329  NA 142 139  138 143 144  K 4.4 4.2  4.2 4.0 3.5  CL 93* 94* 93* 94*  CO2 35*  --  35* 40*  GLUCOSE 133* 126* 95 171*  BUN 24* 30* 24* 19  CREATININE 1.08* 1.40* 1.22* 1.24*  CALCIUM 10.1  --  10.4* 10.1   GFR: CrCl cannot be calculated (Unknown ideal weight.).   Scheduled Meds:  amLODipine  10 mg Oral Daily   benztropine  0.5 mg Oral QHS   Chlorhexidine Gluconate Cloth  6 each Topical Daily   divalproex  125 mg Oral BID   enoxaparin (LOVENOX) injection  40 mg Subcutaneous Q24H   furosemide  40 mg Oral Daily   lactulose  30 g Oral TID   levothyroxine  75 mcg Oral Q0600   LORazepam  1 mg Intravenous Once   lurasidone  40 mg Oral Q breakfast   OLANZapine  10 mg Intramuscular Once   pantoprazole  40 mg Oral Daily   sodium chloride flush  10-40 mL Intracatheter Q12H   sodium chloride flush  3 mL Intravenous Q12H     LOS: 2 days   Lonia Blood, MD [facility] Office  [phone] Pager - Text Page per Loretha Stapler  If 7PM-7AM, please contact night-coverage per Amion 07/16/2023, 8:37 AM

## 2023-07-17 ENCOUNTER — Other Ambulatory Visit: Payer: BC Managed Care – PPO

## 2023-07-17 ENCOUNTER — Ambulatory Visit: Payer: BC Managed Care – PPO | Admitting: Physician Assistant

## 2023-07-17 ENCOUNTER — Ambulatory Visit: Payer: BC Managed Care – PPO

## 2023-07-17 ENCOUNTER — Inpatient Hospital Stay: Payer: BC Managed Care – PPO

## 2023-07-17 DIAGNOSIS — K7682 Hepatic encephalopathy: Secondary | ICD-10-CM | POA: Diagnosis not present

## 2023-07-17 LAB — BASIC METABOLIC PANEL
Anion gap: 8 (ref 5–15)
BUN: 17 mg/dL (ref 6–20)
CO2: 42 mmol/L — ABNORMAL HIGH (ref 22–32)
Calcium: 10.1 mg/dL (ref 8.9–10.3)
Chloride: 97 mmol/L — ABNORMAL LOW (ref 98–111)
Creatinine, Ser: 1.18 mg/dL — ABNORMAL HIGH (ref 0.44–1.00)
GFR, Estimated: 54 mL/min — ABNORMAL LOW (ref >60)
Glucose, Bld: 166 mg/dL — ABNORMAL HIGH (ref 70–99)
Potassium: 3.6 mmol/L (ref 3.5–5.1)
Sodium: 147 mmol/L — ABNORMAL HIGH (ref 135–145)

## 2023-07-17 LAB — CBC
HCT: 35.5 % — ABNORMAL LOW (ref 36.0–46.0)
Hemoglobin: 11.1 g/dL — ABNORMAL LOW (ref 12.0–15.0)
MCH: 33.6 pg (ref 26.0–34.0)
MCHC: 31.3 g/dL (ref 30.0–36.0)
MCV: 107.6 fL — ABNORMAL HIGH (ref 80.0–100.0)
Platelets: 123 10*3/uL — ABNORMAL LOW (ref 150–400)
RBC: 3.3 MIL/uL — ABNORMAL LOW (ref 3.87–5.11)
RDW: 16.2 % — ABNORMAL HIGH (ref 11.5–15.5)
WBC: 11.6 10*3/uL — ABNORMAL HIGH (ref 4.0–10.5)
nRBC: 0 % (ref 0.0–0.2)

## 2023-07-17 LAB — AMMONIA: Ammonia: 53 umol/L — ABNORMAL HIGH (ref 9–35)

## 2023-07-17 NOTE — TOC CM/SW Note (Signed)
 Transition of Care [facility]) - Inpatient Brief Assessment   Patient Details  Name: Madison Palmer MRN: [id number] Date of Birth: 1965/07/24  [facility] CM/SW Contact:    Baldemar Lenis, LCSW Phone Number: 07/17/2023, 1:52 PM   Clinical Narrative:    Patient admitted from [facility] where she had been receiving rehabilitation. CSW acknowledging consult that it was reported that patient had been in the process of transitioning to inpatient hospice, but unsure if patient is appropriate for inpatient hospice at this time; awaiting completion of medical workup, goals of care, and recommendations for disposition. CSW to follow.    Transition of Care Asessment: Insurance and Status: Insurance coverage has been reviewed Patient has primary care physician: Yes Home environment has been reviewed: has been at [facility] for rehabilitation Prior level of function:: needs assist Prior/Current Home Services: Current home services Social Drivers of Health Review: SDOH reviewed no interventions necessary Readmission risk has been reviewed: Yes Transition of care needs: transition of care needs identified, TOC will continue to follow

## 2023-07-17 NOTE — Progress Notes (Signed)
 Madison Palmer  ZOX:[id number] DOB: 06-10-1965 DOA: 07/14/2023 PCP: Anne Ng, NP    Brief Narrative:  58 year old with a history of stage IV lung cancer metastatic to the liver, bipolar disorder, diastolic CHF, HTN, HLD, depression, hypothyroidism, sleep apnea, and obesity who presented to the ER 3/15 with altered mental status and lethargy.  At baseline she is alert oriented and conversant but on the day of presentation the patient's family noted she was minimally responsive and could only summon a few words occasionally.  In the ER CT head was without acute findings.  CXR noted stable bilateral pleural effusions.  She was afebrile with a normal WBC.  Ammonia was elevated at 84.  UA was equivocal.  Goals of Care:   Code Status: Do not attempt resuscitation (DNR) PRE-ARREST INTERVENTIONS DESIRED   DVT prophylaxis: enoxaparin (LOVENOX) injection 40 mg Start: 07/14/23 1400   Interim Hx: Afebrile.  Blood pressure modestly elevated.  Vitals otherwise stable.  No acute events reported overnight.  Calm today but somnolent.  Awakens to my voice.  Does not attempt to speak to me or answer questions.  No evidence of distress.  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 Completed last chemotherapy treatment in January 2025 - plan at present has been to transition to hospice care - her Oncologist has been added to the treatment team for awareness  Chronic diastolic CHF with mild  acute exacerbation With climbing sodium and poor oral intake we will hold diuretic for now  Mild hyponatremia Due to free water deficit with poor intake - hold diuresis  HTN Follow-up without change in treatment for now  CKD stage IIIa Creatinine presently stable  HLD Little benefit is to be gained from ongoing use of medical therapy for this issue at this time  Bipolar disorder - depression Continue usual home medication regimen when patient more alert  Hypothyroidism Continue usual Synthroid dose  UTI POA At admission UA positive for leukocyte esterase and nitrite but 0-5 WBC -no known history of recent dysuria but of course confusion itself could be a symptom of a UTI -given her failure to improve from a delirium standpoint she is being treated with a short course of antibiotic   Obesity - Body mass index is 47.98 kg/m.   Family Communication: I spoke with the patient's spouse via telephone 3/17 Disposition: Unclear at this time   Objective: Blood pressure (!) 140/116, pulse 97, temperature 98.1 F (36.7 C), resp. rate 18, height 5\' 2"  (1.575 m), weight 119 kg, last menstrual period 05/02/2017, SpO2 94%.  Intake/Output Summary (Last 24 hours) at 07/17/2023 0824 Last data filed at 07/16/2023 1136 Gross per 24 hour  Intake 0 ml  Output --  Net 0 ml   Filed Weights   07/17/23 0414  Weight: 119 kg    Examination: General: No acute respiratory distress - sedate/calm Lungs: Clear to auscultation bilaterally  Cardiovascular: RRR without murmur  Abdomen: NT/ND, soft, BS positive, no rebound Extremities: No significant cyanosis, clubbing, or edema bilateral lower extremities  CBC: Recent Labs  Lab 07/14/23 1138 07/14/23 1151 07/15/23 0256 07/16/23 0329 07/17/23 0328  WBC 10.7*  --  12.2* 13.6* 11.6*  NEUTROABS 9.6*  --   --   --   --   HGB 10.2*   < > 11.1* 11.6* 11.1*  HCT 33.3*   < > 35.0* 36.7 35.5*  MCV 110.3*  --  107.4* 107.3* 107.6*  PLT 132*  --  152  160 123*   < > = values in this interval not displayed.   Basic Metabolic Panel: Recent Labs  Lab 07/15/23 0256 07/16/23 0329 07/17/23 0328  NA 143 144 147*  K 4.0 3.5 3.6  CL 93* 94* 97*  CO2 35* 40* 42*  GLUCOSE 95 171* 166*  BUN 24* 19 17  CREATININE 1.22* 1.24* 1.18*  CALCIUM 10.4* 10.1 10.1   GFR: Estimated Creatinine Clearance: 64.5 mL/min (A) (by C-G formula based on SCr of 1.18 mg/dL (H)).   Scheduled Meds:  amLODipine  10 mg Oral Daily   benztropine  0.5 mg Oral QHS   Chlorhexidine Gluconate Cloth  6 each Topical Daily   enoxaparin (LOVENOX) injection  40 mg Subcutaneous Q24H   furosemide  40 mg Oral Daily   lactulose  30 g Oral TID   levothyroxine  75 mcg Oral Q0600   lurasidone  40 mg Oral Q breakfast   pantoprazole  40 mg Oral Daily   sodium chloride flush  10-40 mL Intracatheter Q12H   sodium chloride flush  3 mL Intravenous Q12H     LOS: 3 days   Lonia Blood, MD [facility] Office  [phone] Pager - Text Page per Loretha Stapler  If 7PM-7AM, please contact night-coverage per Amion 07/17/2023, 8:24 AM

## 2023-07-18 ENCOUNTER — Inpatient Hospital Stay (HOSPITAL_COMMUNITY)

## 2023-07-18 DIAGNOSIS — K7682 Hepatic encephalopathy: Secondary | ICD-10-CM | POA: Diagnosis not present

## 2023-07-18 LAB — CBC
HCT: 34.6 % — ABNORMAL LOW (ref 36.0–46.0)
Hemoglobin: 10.9 g/dL — ABNORMAL LOW (ref 12.0–15.0)
MCH: 33.6 pg (ref 26.0–34.0)
MCHC: 31.5 g/dL (ref 30.0–36.0)
MCV: 106.8 fL — ABNORMAL HIGH (ref 80.0–100.0)
Platelets: 111 10*3/uL — ABNORMAL LOW (ref 150–400)
RBC: 3.24 MIL/uL — ABNORMAL LOW (ref 3.87–5.11)
RDW: 15.6 % — ABNORMAL HIGH (ref 11.5–15.5)
WBC: 11.3 10*3/uL — ABNORMAL HIGH (ref 4.0–10.5)
nRBC: 0 % (ref 0.0–0.2)

## 2023-07-18 LAB — BASIC METABOLIC PANEL
Anion gap: 10 (ref 5–15)
BUN: 16 mg/dL (ref 6–20)
CO2: 38 mmol/L — ABNORMAL HIGH (ref 22–32)
Calcium: 10 mg/dL (ref 8.9–10.3)
Chloride: 93 mmol/L — ABNORMAL LOW (ref 98–111)
Creatinine, Ser: 1.09 mg/dL — ABNORMAL HIGH (ref 0.44–1.00)
GFR, Estimated: 59 mL/min — ABNORMAL LOW (ref >60)
Glucose, Bld: 145 mg/dL — ABNORMAL HIGH (ref 70–99)
Potassium: 3 mmol/L — ABNORMAL LOW (ref 3.5–5.1)
Sodium: 141 mmol/L (ref 135–145)

## 2023-07-18 LAB — MAGNESIUM: Magnesium: 1.7 mg/dL (ref 1.7–2.4)

## 2023-07-18 LAB — AMMONIA: Ammonia: 31 umol/L (ref 9–35)

## 2023-07-18 MED ORDER — QUETIAPINE FUMARATE 25 MG PO TABS
25.0000 mg | ORAL_TABLET | Freq: Every day | ORAL | Status: DC
  Filled 2023-07-18: qty 1

## 2023-07-18 MED ORDER — HALOPERIDOL LACTATE 5 MG/ML IJ SOLN
2.5000 mg | Freq: Once | INTRAMUSCULAR | Status: AC
  Administered 2023-07-18: 2.5 mg via INTRAVENOUS
  Filled 2023-07-18: qty 1

## 2023-07-18 MED ORDER — SODIUM CHLORIDE 0.9 % IV SOLN: INTRAVENOUS | Status: DC

## 2023-07-18 MED ORDER — MAGNESIUM SULFATE 2 GM/50ML IV SOLN: 2.0000 g | Freq: Once | INTRAVENOUS | Status: DC

## 2023-07-18 MED ORDER — LORAZEPAM 2 MG/ML IJ SOLN
1.0000 mg | Freq: Once | INTRAMUSCULAR | Status: AC
  Administered 2023-07-18: 1 mg via INTRAVENOUS

## 2023-07-18 MED ORDER — POTASSIUM CHLORIDE CRYS ER 20 MEQ PO TBCR
40.0000 meq | EXTENDED_RELEASE_TABLET | ORAL | Status: AC
Start: 2023-07-18 — End: 2023-07-18
  Administered 2023-07-18 (×2): 40 meq via ORAL
  Filled 2023-07-18: qty 2

## 2023-07-18 MED ORDER — MAGNESIUM SULFATE IN D5W 1-5 GM/100ML-% IV SOLN
1.0000 g | Freq: Once | INTRAVENOUS | Status: AC
  Administered 2023-07-18: 1 g via INTRAVENOUS
  Filled 2023-07-18: qty 100

## 2023-07-18 MED ORDER — LABETALOL HCL 200 MG PO TABS
300.0000 mg | ORAL_TABLET | Freq: Two times a day (BID) | ORAL | Status: DC
  Administered 2023-07-18: 300 mg via ORAL
  Filled 2023-07-18 (×2): qty 2

## 2023-07-18 NOTE — Progress Notes (Addendum)
 PROGRESS NOTE  Madison Palmer  WGN:[id number] DOB: Aug 10, 1965 DOA: 07/14/2023 PCP: Anne Ng, NP   Brief Narrative: Patient is a 58 year old female with history of stage IV lung cancer metastatic to liver, bipolar disorder, diastolic CHF, hypertension, hyperlipidemia, depression, hypothyroidism, sleep apnea, obesity who presented here on 3/15 with altered mental status, lethargy.  As per report, she is usually alert and oriented but family found her minimally responsive.  CT head did not show any acute findings.  Hospital course remarkable for agitation, worsening confusion.  Ammonia level elevated.  There was discussion about transitioning her care to comfort at nursing facility.  Palliative  care also consulted for goals of care.  Assessment & Plan:  Principal Problem:   Acute hepatic encephalopathy (HCC) Active Problems:   Metastatic adenocarcinoma to liver (HCC)   Adenocarcinoma of left lung, stage 4 (HCC)   Acute on chronic diastolic CHF (congestive heart failure) (HCC)   Essential hypertension   Chronic kidney disease, stage III (moderate) (HCC)   Hyperlipidemia associated with type 2 diabetes mellitus (HCC)   Schizoaffective disorder, bipolar type (HCC)   Hypothyroidism   GERD (gastroesophageal reflux disease)   Acute metabolic encephalopathy: Presented with altered mentation, agitation, decreased responsiveness.  CT head did not show any acute findings.  Also thought to be secondary to elevated ammonia level, possible UTI.  UDS positive for benzos.  Vitamin B12, folic acid level normal.  Given Ativan, Haldol as well as Zyprexa.  Unclear etiology.  MRI of the brain ordered to rule out metastatic brain lesions from lung cancer.  EEG might need to be done but will wait for palliative care evaluation for goals of care discussion. Ordered low-dose Seroquel for night. I had a long discussion with her husband Mr. Excell Seltzer who is currently separated from her and lives in [place].   Mr. Excell Seltzer states this acute change in the mentation is not new for her.  She has been intermittently confused and agitated since last several years.  Stage IV adenocarcinoma of the lung with mets to liver: Completed last chemotherapy on January 25.  Follows with Dr. Arbutus Ped.  Plan was to transition her care to hospice at her nursing facility.  Palliative care consulted here.  Mr. Excell Seltzer, her husband is very interested on hospice approach but he also requested input from Dr. Arbutus Ped.  I have sent text to Dr. Arbutus Ped requesting to see her here.  Elevated ammonia level: Started on lactulose.  Depakote on hold.  Suspected UTI: Finished 3 days course of ceftriaxone.  Blood cultures have not shown any growth  Hypokalemia: Supplemented with potassium  Chronic diastolic CHF: Due to poor oral intake, diuretics on hold.  Started on gentle IV fluids due to poor oral intake  Hypertension: Monitor blood pressure.Continue current medications  CKD stage IIIa: Currently kidney function at baseline  Hyperlipidemia: Was taking statin  Bipolar disorder/depression: Home medications on hold  Hypothyroidism: On Synthyroid          DVT prophylaxis:enoxaparin (LOVENOX) injection 40 mg Start: 07/14/23 1400     Code Status: Do not attempt resuscitation (DNR) PRE-ARREST INTERVENTIONS DESIRED  Family Communication: Called and discussed with husband Mr. Cooper on 3/19  Patient status:Inpatient  Patient is from :nursing facility  Anticipated discharge to:not sure  Estimated DC date:not sure   Consultants: None yet  Procedures:None  Antimicrobials:  Anti-infectives (From admission, onward)    Start     Dose/Rate Route Frequency Ordered Stop   07/16/23 1000  cefTRIAXone (ROCEPHIN) 1 g  in sodium chloride 0.9 % 100 mL IVPB        1 g 200 mL/hr over 30 Minutes Intravenous Every 24 hours 07/16/23 0841 07/18/23 1027       Subjective: Patient seen and examined at bedside today.   Lying in bed.   She was moaning.  Unclear if she has pain or something.  EKG monitor showed sinus tachycardia.  Patient does not engage in communication.  Easily distracted.  Blood pressure stable.  Not in apparent distress  Objective: Vitals:   07/18/23 0625 07/18/23 0823 07/18/23 0919 07/18/23 1026  BP:   (!) 134/97   Pulse: (!) 107  (!) 141 (!) 135  Resp:   18   Temp:   98.4 F (36.9 C)   TempSrc:   Axillary   SpO2:  100% 97%   Weight:      Height:        Intake/Output Summary (Last 24 hours) at 07/18/2023 1116 Last data filed at 07/18/2023 0900 Gross per 24 hour  Intake 1920 ml  Output 200 ml  Net 1720 ml   Filed Weights   07/17/23 0414  Weight: 119 kg    Examination:  General exam: Overall comfortable, not in distress,morbidly obese HEENT: PERRL Respiratory system:  no wheezes or crackles , diminished sounds on bases Cardiovascular system: Sinus tachycardia Gastrointestinal system: Abdomen is nondistended, soft and nontender. Central nervous system: Not alert or oriented Extremities: No edema, no clubbing ,no cyanosis Skin: No rashes, no ulcers,no icterus     Data Reviewed: I have personally reviewed following labs and imaging studies  CBC: Recent Labs  Lab 07/14/23 1138 07/14/23 1151 07/15/23 0256 07/16/23 0329 07/17/23 0328 07/18/23 0324  WBC 10.7*  --  12.2* 13.6* 11.6* 11.3*  NEUTROABS 9.6*  --   --   --   --   --   HGB 10.2* 10.9*  11.2* 11.1* 11.6* 11.1* 10.9*  HCT 33.3* 32.0*  33.0* 35.0* 36.7 35.5* 34.6*  MCV 110.3*  --  107.4* 107.3* 107.6* 106.8*  PLT 132*  --  152 160 123* 111*   Basic Metabolic Panel: Recent Labs  Lab 07/14/23 1138 07/14/23 1151 07/15/23 0256 07/16/23 0329 07/17/23 0328 07/18/23 0324  NA 142 139  138 143 144 147* 141  K 4.4 4.2  4.2 4.0 3.5 3.6 3.0*  CL 93* 94* 93* 94* 97* 93*  CO2 35*  --  35* 40* 42* 38*  GLUCOSE 133* 126* 95 171* 166* 145*  BUN 24* 30* 24* 19 17 16   CREATININE 1.08* 1.40* 1.22* 1.24* 1.18* 1.09*   CALCIUM 10.1  --  10.4* 10.1 10.1 10.0  MG  --   --   --   --   --  1.7     Recent Results (from the past 240 hours)  Resp panel by RT-PCR (RSV, Flu A&B, Covid) Anterior Nasal Swab     Status: None   Collection Time: 07/14/23 10:59 AM   Specimen: Anterior Nasal Swab  Result Value Ref Range Status   SARS Coronavirus 2 by RT PCR NEGATIVE NEGATIVE Final   Influenza A by PCR NEGATIVE NEGATIVE Final   Influenza B by PCR NEGATIVE NEGATIVE Final    Comment: (NOTE) The Xpert Xpress SARS-CoV-2/FLU/RSV plus assay is intended as an aid in the diagnosis of influenza from Nasopharyngeal swab specimens and should not be used as a sole basis for treatment. Nasal washings and aspirates are unacceptable for Xpert Xpress SARS-CoV-2/FLU/RSV testing.  Fact Sheet for  Patients: BloggerCourse.com  Fact Sheet for Healthcare Providers: SeriousBroker.it  This test is not yet approved or cleared by the [place] FDA and has been authorized for detection and/or diagnosis of SARS-CoV-2 by FDA under an Emergency Use Authorization (EUA). This EUA will remain in effect (meaning this test can be used) for the duration of the COVID-19 declaration under Section 564(b)(1) of the Act, 21 U.S.C. section 360bbb-3(b)(1), unless the authorization is terminated or revoked.     Resp Syncytial Virus by PCR NEGATIVE NEGATIVE Final    Comment: (NOTE) Fact Sheet for Patients: BloggerCourse.com  Fact Sheet for Healthcare Providers: SeriousBroker.it  This test is not yet approved or cleared by the [place] FDA and has been authorized for detection and/or diagnosis of SARS-CoV-2 by FDA under an Emergency Use Authorization (EUA). This EUA will remain in effect (meaning this test can be used) for the duration of the COVID-19 declaration under Section 564(b)(1) of the Act, 21 U.S.C. section 360bbb-3(b)(1), unless  the authorization is terminated or revoked.  Performed at [facility], [address] [place], [place] [address]   Culture, blood (routine x 2)     Status: None (Preliminary result)   Collection Time: 07/14/23 11:04 AM   Specimen: BLOOD LEFT WRIST  Result Value Ref Range Status   Specimen Description BLOOD LEFT WRIST  Final   Special Requests   Final    BOTTLES DRAWN AEROBIC AND ANAEROBIC Blood Culture adequate volume   Culture   Final    NO GROWTH 4 DAYS Performed at [facility], [address], [place], [place] [address]    Report Status PENDING  Incomplete  Culture, blood (routine x 2)     Status: None (Preliminary result)   Collection Time: 07/14/23 12:19 PM   Specimen: BLOOD LEFT ARM  Result Value Ref Range Status   Specimen Description BLOOD LEFT ARM  Final   Special Requests   Final    BOTTLES DRAWN AEROBIC AND ANAEROBIC Blood Culture adequate volume   Culture   Final    NO GROWTH 4 DAYS Performed at [facility], [address] [place], [place] [address]    Report Status PENDING  Incomplete     Radiology Studies: No results found.  Scheduled Meds:  amLODipine  10 mg Oral Daily   benztropine  0.5 mg Oral QHS   Chlorhexidine Gluconate Cloth  6 each Topical Daily   enoxaparin (LOVENOX) injection  40 mg Subcutaneous Q24H   lactulose  30 g Oral TID   levothyroxine  75 mcg Oral Q0600   lurasidone  40 mg Oral Q breakfast   pantoprazole  40 mg Oral Daily   sodium chloride flush  10-40 mL Intracatheter Q12H   sodium chloride flush  3 mL Intravenous Q12H   Continuous Infusions:  sodium chloride 75 mL/hr at 07/18/23 1055     LOS: 4 days   Burnadette Pop, MD [facility] P3/19/2025, 11:16 AM  PROGRESS NOTE  Madison Palmer  UYQ:[id number] DOB: Oct 07, 1965 DOA: 07/14/2023 PCP: Anne Ng, NP   Brief Narrative:   Assessment & Plan:  Principal Problem:   Acute hepatic encephalopathy (HCC) Active Problems:   Metastatic  adenocarcinoma to liver [facility])   Adenocarcinoma of left lung, stage 4 (HCC)   Acute on chronic diastolic CHF (congestive heart failure) (HCC)   Essential hypertension   Chronic kidney disease, stage III (moderate) (HCC)   Hyperlipidemia associated with type 2 diabetes mellitus (HCC)  Schizoaffective disorder, bipolar type (HCC)   Hypothyroidism   GERD (gastroesophageal reflux disease)           DVT prophylaxis:enoxaparin (LOVENOX) injection 40 mg Start: 07/14/23 1400     Code Status: Do not attempt resuscitation (DNR) PRE-ARREST INTERVENTIONS DESIRED  Family Communication:   Patient status:  Patient is from :  Anticipated discharge to:  Estimated DC date:   Consultants:   Procedures:  Antimicrobials:  Anti-infectives (From admission, onward)    Start     Dose/Rate Route Frequency Ordered Stop   07/16/23 1000  cefTRIAXone (ROCEPHIN) 1 g in sodium chloride 0.9 % 100 mL IVPB        1 g 200 mL/hr over 30 Minutes Intravenous Every 24 hours 07/16/23 0841 07/18/23 1027       Subjective:   Objective: Vitals:   07/18/23 0625 07/18/23 0823 07/18/23 0919 07/18/23 1026  BP:   (!) 134/97   Pulse: (!) 107  (!) 141 (!) 135  Resp:   18   Temp:   98.4 F (36.9 C)   TempSrc:   Axillary   SpO2:  100% 97%   Weight:      Height:        Intake/Output Summary (Last 24 hours) at 07/18/2023 1116 Last data filed at 07/18/2023 0900 Gross per 24 hour  Intake 1920 ml  Output 200 ml  Net 1720 ml   Filed Weights   07/17/23 0414  Weight: 119 kg    Examination:  General exam: Overall comfortable, not in distress HEENT: PERRL Respiratory system:  no wheezes or crackles  Cardiovascular system: S1 & S2 heard, RRR.  Gastrointestinal system: Abdomen is nondistended, soft and nontender. Central nervous system: Alert and oriented Extremities: No edema, no clubbing ,no cyanosis Skin: No rashes, no ulcers,no icterus     Data Reviewed: I have personally reviewed following  labs and imaging studies  CBC: Recent Labs  Lab 07/14/23 1138 07/14/23 1151 07/15/23 0256 07/16/23 0329 07/17/23 0328 07/18/23 0324  WBC 10.7*  --  12.2* 13.6* 11.6* 11.3*  NEUTROABS 9.6*  --   --   --   --   --   HGB 10.2* 10.9*  11.2* 11.1* 11.6* 11.1* 10.9*  HCT 33.3* 32.0*  33.0* 35.0* 36.7 35.5* 34.6*  MCV 110.3*  --  107.4* 107.3* 107.6* 106.8*  PLT 132*  --  152 160 123* 111*   Basic Metabolic Panel: Recent Labs  Lab 07/14/23 1138 07/14/23 1151 07/15/23 0256 07/16/23 0329 07/17/23 0328 07/18/23 0324  NA 142 139  138 143 144 147* 141  K 4.4 4.2  4.2 4.0 3.5 3.6 3.0*  CL 93* 94* 93* 94* 97* 93*  CO2 35*  --  35* 40* 42* 38*  GLUCOSE 133* 126* 95 171* 166* 145*  BUN 24* 30* 24* 19 17 16   CREATININE 1.08* 1.40* 1.22* 1.24* 1.18* 1.09*  CALCIUM 10.1  --  10.4* 10.1 10.1 10.0  MG  --   --   --   --   --  1.7     Recent Results (from the past 240 hours)  Resp panel by RT-PCR (RSV, Flu A&B, Covid) Anterior Nasal Swab     Status: None   Collection Time: 07/14/23 10:59 AM   Specimen: Anterior Nasal Swab  Result Value Ref Range Status   SARS Coronavirus 2 by RT PCR NEGATIVE NEGATIVE Final   Influenza A by PCR NEGATIVE NEGATIVE Final   Influenza B by PCR NEGATIVE NEGATIVE Final  Comment: (NOTE) The Xpert Xpress SARS-CoV-2/FLU/RSV plus assay is intended as an aid in the diagnosis of influenza from Nasopharyngeal swab specimens and should not be used as a sole basis for treatment. Nasal washings and aspirates are unacceptable for Xpert Xpress SARS-CoV-2/FLU/RSV testing.  Fact Sheet for Patients: BloggerCourse.com  Fact Sheet for Healthcare Providers: SeriousBroker.it  This test is not yet approved or cleared by the [place] FDA and has been authorized for detection and/or diagnosis of SARS-CoV-2 by FDA under an Emergency Use Authorization (EUA). This EUA will remain in effect (meaning this test can be  used) for the duration of the COVID-19 declaration under Section 564(b)(1) of the Act, 21 U.S.C. section 360bbb-3(b)(1), unless the authorization is terminated or revoked.     Resp Syncytial Virus by PCR NEGATIVE NEGATIVE Final    Comment: (NOTE) Fact Sheet for Patients: BloggerCourse.com  Fact Sheet for Healthcare Providers: SeriousBroker.it  This test is not yet approved or cleared by the [place] FDA and has been authorized for detection and/or diagnosis of SARS-CoV-2 by FDA under an Emergency Use Authorization (EUA). This EUA will remain in effect (meaning this test can be used) for the duration of the COVID-19 declaration under Section 564(b)(1) of the Act, 21 U.S.C. section 360bbb-3(b)(1), unless the authorization is terminated or revoked.  Performed at [facility], [address] [place], [place] [address]   Culture, blood (routine x 2)     Status: None (Preliminary result)   Collection Time: 07/14/23 11:04 AM   Specimen: BLOOD LEFT WRIST  Result Value Ref Range Status   Specimen Description BLOOD LEFT WRIST  Final   Special Requests   Final    BOTTLES DRAWN AEROBIC AND ANAEROBIC Blood Culture adequate volume   Culture   Final    NO GROWTH 4 DAYS Performed at [facility], [address], [place], [place] [address]    Report Status PENDING  Incomplete  Culture, blood (routine x 2)     Status: None (Preliminary result)   Collection Time: 07/14/23 12:19 PM   Specimen: BLOOD LEFT ARM  Result Value Ref Range Status   Specimen Description BLOOD LEFT ARM  Final   Special Requests   Final    BOTTLES DRAWN AEROBIC AND ANAEROBIC Blood Culture adequate volume   Culture   Final    NO GROWTH 4 DAYS Performed at [facility], [address] [place], [place] [address]    Report Status PENDING  Incomplete     Radiology Studies: No results found.  Scheduled Meds:  amLODipine  10 mg Oral Daily    benztropine  0.5 mg Oral QHS   Chlorhexidine Gluconate Cloth  6 each Topical Daily   enoxaparin (LOVENOX) injection  40 mg Subcutaneous Q24H   lactulose  30 g Oral TID   levothyroxine  75 mcg Oral Q0600   lurasidone  40 mg Oral Q breakfast   pantoprazole  40 mg Oral Daily   sodium chloride flush  10-40 mL Intracatheter Q12H   sodium chloride flush  3 mL Intravenous Q12H   Continuous Infusions:  sodium chloride 75 mL/hr at 07/18/23 1055     LOS: 4 days   Burnadette Pop, MD [facility] P3/19/2025, 11:16 AM

## 2023-07-18 NOTE — Plan of Care (Signed)

## 2023-07-18 NOTE — Consult Note (Signed)
 [facility] Liaison Consult Note   07/18/2023  Madison Palmer 08-11-1965 [id number]  Insurance: [employer] [facility] [employer]   Primary Care Provider: Anne Ng, NP with [facility], this provider is listed for the transition of care follow up appointments  and [facility] calls   [facility] Liaison rounding at [facility]    The patient was screened for less than 30 day readmission hospitalization with noted extreme high risk score for unplanned readmission risk 2 hospital admissions in 6 months.  The patient was assessed for potential [facility] Coordination service needs for post hospital transition for care coordination. Review of patient's electronic medical record reveals patient is admitted from [facility] with altered mental status and lethargy.  Patient with Stage IV [place] CA with Acute Metabolic Encephalopathy.   Plan: [facility] Liaison will continue to follow progress and disposition to asess for post hospital community care coordination/management needs.  Referral request for community care coordination: Patient/family seeking possible hospice care, will follow for disposition/needs.   [facility], Population Health does not replace or interfere with any arrangements made by the Inpatient Transition of Care team.   For questions contact:   Charlesetta Shanks, RN, BSN, CCM [place]  [facility], [facility] Liaison Direct Dial: [phone] or secure chat Email: [email or web address]

## 2023-07-18 NOTE — Progress Notes (Addendum)
 Madison Palmer   DOB:06/10/1965   ZO#:[id number]      ASSESSMENT & PLAN:   Non-small cell lung cancer (T2a, N3, M1c)  with bone and hepatic mets - Patient was diagnosed in May 2024.  No actionable mutations. - Status post 6 cycles palliative chemotherapy/immunotherapy with Ledell Noss, Alimta, and Keytruda every 3 weeks.  First dose was given 10/10/2022, subsequently on maintenance therapy with Alimta/Keytruda every 3 weeks.  Completed a total of 11 cycles. - Last seen in outpatient oncology 05/29/2023.  Due to repeated hospitalizations and mentation, chemotherapy was placed on hold.  She has also had significant lower extremity weakness. - Patient has been in Rehab facility since last hospitalization.  Hospice has been recommended. Family would like to discuss hospice.  - Palliative team for goals of care discussion - Medical oncology/Dr. Arbutus Ped following closely  Altered mental status Acute hepatic encephalopathy - Admitted 07/14/2023 with worsening confusion and lethargy.  She has also had intermittent agitation.  - Ammonia levels have been elevated, noted decreased today.  However per nurse patient remains very agitated today. - Unclear etiology.  MRI of brain done to 06/07/2023 showed no acute intracranial abnormality. Results pending.  - CT angio head done 06/28/2022 also showed no acute vascular abnormality or significant vascular malformation. -For repeat MRI brain today. - Continue supportive care  Anemia Thrombocytopenia -Hemoglobin stable 10.9 - Platelets low 111K - No transfusional intervention required at this time - Monitor CBC with differential  Psychoaffective disorder, bipolar -History of bipolar disorder - Administer psych medications as ordered -Continue supportive care   Code Status DNR-INTERV  Subjective:  Patient seen today after MRI, remains very lethargic. Minimal response to verbal, does not open her eyes.  No acute distress is noted at this time.   Objective:   Vitals:   07/18/23 1237 07/18/23 1345  BP: (!) 121/94 112/75  Pulse: (!) 131 (!) 101  Resp: 18   Temp: (!) 97.5 F (36.4 C) (!) 97.4 F (36.3 C)  SpO2: 100% 91%     Intake/Output Summary (Last 24 hours) at 07/18/2023 1444 Last data filed at 07/18/2023 1420 Gross per 24 hour  Intake 1680 ml  Output 200 ml  Net 1480 ml     REVIEW OF SYSTEMS:  Unable to obtain  PHYSICAL EXAMINATION: ECOG PERFORMANCE STATUS: 4 - Bedbound  Vitals:   07/18/23 1237 07/18/23 1345  BP: (!) 121/94 112/75  Pulse: (!) 131 (!) 101  Resp: 18   Temp: (!) 97.5 F (36.4 C) (!) 97.4 F (36.3 C)  SpO2: 100% 91%   Filed Weights   07/17/23 0414  Weight: 262 lb 5.6 oz (119 kg)    GENERAL: +lethargic +somnolent +chronically ill appearing  SKIN: skin color, texture, turgor are normal, no rashes or significant lesions EYES: normal, conjunctiva are pink and non-injected, sclera clear OROPHARYNX: no exudate, no erythema and lips, buccal mucosa, and tongue normal  NECK: supple, thyroid normal size, non-tender, without nodularity LYMPH: no palpable lymphadenopathy in the cervical, axillary or inguinal LUNGS: clear to auscultation and percussion with normal breathing effort HEART: regular rate & rhythm and no murmurs and no lower extremity edema ABDOMEN: abdomen soft, non-tender and normal bowel sounds MUSCULOSKELETAL: no cyanosis of digits and no clubbing  PSYCH: +lethargic NEURO: no focal motor/sensory deficits   All questions were answered. The patient knows to call the clinic with any problems, questions or concerns.   The total time spent in the appointment was 40 minutes encounter with patient including review of  chart and various tests results, discussions about plan of care and coordination of care plan  Dawson Bills, NP 07/18/2023 2:44 PM    Labs Reviewed:  Lab Results  Component Value Date   WBC 11.3 (H) 07/18/2023   HGB 10.9 (L) 07/18/2023   HCT 34.6 (L) 07/18/2023   MCV 106.8 (H)  07/18/2023   PLT 111 (L) 07/18/2023   Recent Labs    07/14/23 1138 07/14/23 1151 07/15/23 0256 07/16/23 0329 07/17/23 0328 07/18/23 0324  NA 142   < > 143 144 147* 141  K 4.4   < > 4.0 3.5 3.6 3.0*  CL 93*   < > 93* 94* 97* 93*  CO2 35*  --  35* 40* 42* 38*  GLUCOSE 133*   < > 95 171* 166* 145*  BUN 24*   < > 24* 19 17 16   CREATININE 1.08*   < > 1.22* 1.24* 1.18* 1.09*  CALCIUM 10.1  --  10.4* 10.1 10.1 10.0  GFRNONAA 60*  --  52* 51* 54* 59*  PROT 5.2*  --  5.6* 5.9*  --   --   ALBUMIN 2.6*  --  2.7* 2.7*  --   --   AST 36  --  72* 90*  --   --   ALT 28  --  50* 77*  --   --   ALKPHOS 99  --  130* 140*  --   --   BILITOT 1.1  --  2.1* 2.0*  --   --    < > = values in this interval not displayed.    Studies Reviewed:  DG CHEST PORT 1 VIEW Result Date: 07/14/2023 CLINICAL DATA:  [id number] Encounter for feeding tube placement [id number] EXAM: PORTABLE CHEST 1 VIEW COMPARISON:  Same-day x-ray FINDINGS: Enteric tube extends below the diaphragm with distal tip and side port projecting within the gastric body. The tube proximally appears to be coiled within the hypopharynx. stable heart size. Similar bilateral airspace opacities. No pneumothorax. IMPRESSION: Enteric tube extends below the diaphragm with distal tip and side port projecting within the gastric body. The tube proximally appears to be coiled within the hypopharynx. Frontal and lateral radiographs of the neck could be obtained to confirm. Electronically Signed   By: Duanne Guess D.O.   On: 07/14/2023 19:04   DG Abd 1 View Result Date: 07/14/2023 CLINICAL DATA:  Feeding tube placement EXAM: ABDOMEN - 1 VIEW COMPARISON:  Radiograph same day FINDINGS: The tip of the feeding tube is in the esophagus below the carina and above the GE junction. The mid section of the tube is coiled in the upper esophagus and hypopharynx. IMPRESSION: Midsection of the feeding tube is coiled in the hypopharynx and upper esophagus. The tip of the tube is  in the distal esophagus. Recommend repositioning. Electronically Signed   By: Genevive Bi M.D.   On: 07/14/2023 19:02   DG Abd Portable 1 View Result Date: 07/14/2023 CLINICAL DATA:  NG tube placement. EXAM: PORTABLE ABDOMEN - 1 VIEW COMPARISON:  None Available. FINDINGS: Tip of the NG tube passes below the carotid, into the distal esophagus, but above the hemidiaphragm. This will need to be further inserted 15-20 cm. IMPRESSION: NG tube tip in the distal esophagus. Recommend advancing 15-20 cm. Electronically Signed   By: Amie Portland M.D.   On: 07/14/2023 17:21   DG Abd Portable 1 View Result Date: 07/14/2023 CLINICAL DATA:  Enteric catheter placement EXAM: PORTABLE ABDOMEN - 1 VIEW  COMPARISON:  07/14/2023 chest x-ray FINDINGS: Frontal view of the lower chest and upper abdomen demonstrates enteric catheter tip projecting over the lower central mediastinum, tip at least 9 cm proximal to the gastroesophageal junction. Nonspecific bowel gas pattern. No acute bony abnormalities. IMPRESSION: 1. Enteric catheter tip projecting over the distal thoracic esophagus, at least 9 cm proximal to the gastroesophageal junction. These results will be called to the ordering clinician or representative by the Radiologist Assistant, and communication documented in the PACS or [employer]. Electronically Signed   By: Sharlet Salina M.D.   On: 07/14/2023 16:11   CT Head Wo Contrast Result Date: 07/14/2023 CLINICAL DATA:  Provided history: Mental status change, unknown cause. Hypernatremia. EXAM: CT HEAD WITHOUT CONTRAST TECHNIQUE: Contiguous axial images were obtained from the base of the skull through the vertex without intravenous contrast. RADIATION DOSE REDUCTION: This exam was performed according to the departmental dose-optimization program which includes automated exposure control, adjustment of the mA and/or kV according to patient size and/or use of iterative reconstruction technique. COMPARISON:  CT  angiogram head/neck 06/29/2023. Head CT 06/29/2023. Brain MRI 06/07/2023. FINDINGS: Brain: No age-advanced or lobar predominant cerebral atrophy. Partially empty sella turcica. There is no acute intracranial hemorrhage. No demarcated cortical infarct. No extra-axial fluid collection. No evidence of an intracranial mass. No midline shift. Vascular: No hyperdense vessel.  Atherosclerotic calcifications. Skull: No calvarial fracture or aggressive osseous lesion. Sinuses/Orbits: No mass or acute finding within the imaged orbits. Post-surgical appearance of the paranasal sinuses. Minimal mucosal thickening within the right maxillary sinus at the imaged levels. Minimal mucosal thickening or small mucous retention cyst within the left maxillary sinus at the imaged levels. Minimal mucosal thickening within the left sphenoid and bilateral ethmoid sinuses. IMPRESSION: 1. No evidence of an acute intracranial abnormality. 2. Partially empty sella turcica. This finding can reflect incidental anatomic variation, or alternatively, it can be associated with chronic idiopathic intracranial hypertension (pseudotumor cerebri). 3. Paranasal sinus disease at the imaged levels, as described. Electronically Signed   By: Jackey Loge D.O.   On: 07/14/2023 13:17   DG Chest Port 1 View Result Date: 07/14/2023 CLINICAL DATA:  Altered mental status EXAM: PORTABLE CHEST 1 VIEW COMPARISON:  06/08/2023 FINDINGS: Low volume chest with indistinct density behind the heart. Small volume bilateral pleural effusion. Underestimated pulmonary nodularity compared to prior CT. Cardiac enlargement. IMPRESSION: Bilateral pleural effusion and retrocardiac airspace disease similar to prior. Electronically Signed   By: Tiburcio Pea M.D.   On: 07/14/2023 11:36   CT ANGIO HEAD NECK W WO CM Result Date: 06/29/2023 CLINICAL DATA:  Acute onset of severe headache. Abnormal CT of the head. EXAM: CT ANGIOGRAPHY HEAD AND NECK WITH AND WITHOUT CONTRAST  TECHNIQUE: Multidetector CT imaging of the head and neck was performed using the standard protocol during bolus administration of intravenous contrast. Multiplanar CT image reconstructions and MIPs were obtained to evaluate the vascular anatomy. Carotid stenosis measurements (when applicable) are obtained utilizing NASCET criteria, using the distal internal carotid diameter as the denominator. RADIATION DOSE REDUCTION: This exam was performed according to the departmental dose-optimization program which includes automated exposure control, adjustment of the mA and/or kV according to patient size and/or use of iterative reconstruction technique. CONTRAST:  80mL OMNIPAQUE IOHEXOL 350 MG/ML SOLN COMPARISON:  CT head without contrast 06/29/23. MR head without and with contrast 05/22/2023 FINDINGS: CTA NECK FINDINGS Aortic arch: The 3 vessel arch configuration is present. Atherosclerotic calcifications are present in the distal arch and at the origins of  the innominate and left subclavian arteries. No focal stenosis or aneurysm is present. No dissection is present. Right carotid system: The right common carotid artery is within normal limits. Atherosclerotic changes are present in the proximal right ICA without a significant stenosis relative to the more distal vessel. Left carotid system: Left common carotid artery is within normal limits. Atherosclerotic calcifications are present at the bifurcation. Mild tortuosity is present in the cervical left ICA without focal stenosis. Vertebral arteries: The vertebral arteries are codominant. Both vertebral arteries originate from the subclavian arteries without significant stenosis. No significant stenosis is present in either vertebral artery in the neck. Skeleton: Degenerative changes are present in the lower cervical spine. 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 Straightening and slight reversal of the normal  cervical lordosis is present. Other neck: The soft tissues of the neck are otherwise unremarkable. Salivary glands are within normal limits. Thyroid is normal. No significant adenopathy is present. No focal mucosal or submucosal lesions are present. Upper chest: A left pleural effusion is present. Patchy airspace opacities are present in the right upper lobe right upper lobe nodule measures 10 x 7 mm on image 16 of series 5. Other smaller nodules are present. A nodule near the apex the right lung measures 6 mm on image 37 of series 5 Review of the MIP images confirms the above findings CTA HEAD FINDINGS Anterior circulation: Atherosclerotic calcifications are present within the cavernous internal carotid arteries bilaterally without significant stenosis. An enlarged empty sella is present. The right cavernous internal carotid artery deviates medially into the sella without aneurysm. The A1 and M1 segments are normal. The anterior communicating artery is patent. MCA bifurcations are normal. The ACA and MCA branch vessels are normal bilaterally. Posterior circulation: Atherosclerotic calcifications are present at the dural margin of both vertebral arteries. The vertebrobasilar junction and basilar artery is normal. The superior cerebellar arteries are patent. Both posterior cerebral arteries originate from basilar tip. The PCA branch vessels are normal bilaterally. Venous sinuses: The dural sinuses are patent. The straight sinus and deep cerebral veins are intact. Cortical veins are within normal limits. No significant vascular malformation is evident. Anatomic variants: None Review of the MIP images confirms the above findings IMPRESSION: 1. No acute vascular abnormality or significant vascular malformation. 2. Atherosclerotic changes at the carotid bifurcations and cavernous internal carotid arteries bilaterally without significant stenosis. 3. Medial deviation of the cavernous right ICA without significant  stenosis. 4. 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 5. Left pleural effusion. 6. Patchy airspace opacities and nodular disease in the right upper lobe likely represents infection. Neoplasm is considered less likely. Non-contrast chest CT at 3-6 months is recommended. If the nodules are stable at time of repeat CT, then future CT at 18-24 months (from today's scan) is considered optional for low-risk patients, but is recommended for high-risk patients. This recommendation follows the consensus statement: Guidelines for Management of Incidental Pulmonary Nodules Detected on CT Images: From the Fleischner Society 2017; Radiology 2017; 284:228-243. These results were called by telephone at the time of interpretation on 06/29/2023 at 8:08 pm to provider Dr. Wilkie Aye, who verbally acknowledged these results. Electronically Signed   By: Marin Roberts M.D.   On: 06/29/2023 20:09   CT Head Wo Contrast Result Date: 06/29/2023 CLINICAL DATA:  New onset headache. EXAM: CT HEAD WITHOUT CONTRAST TECHNIQUE: Contiguous axial images were obtained from the base of the  skull through the vertex without intravenous contrast. RADIATION DOSE REDUCTION: This exam was performed according to the departmental dose-optimization program which includes automated exposure control, adjustment of the mA and/or kV according to patient size and/or use of iterative reconstruction technique. COMPARISON:  MRI head 06/07/2023 FINDINGS: Brain: No acute intracranial hemorrhage. No CT evidence of acute infarct. No edema, mass effect, or midline shift. The basilar cisterns are patent. Ventricles: The ventricles are normal. Vascular: Atherosclerotic calcifications of the bilateral carotid siphons. Prominent medial course of the right cavernous ICA. No hyperdense vessel. Skull: No acute or aggressive finding. Orbits: Orbits are symmetric. Sinuses: Mild mucosal thickening in the ethmoid  and maxillary sinuses. Other: Mastoid air cells are clear. Postsurgical changes of the turbinates. IMPRESSION: 1. No CT evidence of acute intracranial abnormality. 2. Prominent medial course of the right cavernous ICA potentially exerting mass effect on the pituitary. Consider CTA for further evaluation. Electronically Signed   By: Emily Filbert M.D.   On: 06/29/2023 14:49   ADDENDUM: Hematology/Oncology Attending: The patient is seen today.  I agree with the above note.  She is a very pleasant 58 years old African-American female with stage IV non-small cell lung cancer diagnosed in May 2024 with no actionable mutations.  She started palliative systemic chemoimmunotherapy with carboplatin, Alimta and Keytruda for 4 cycles and starting from cycle #5 she has been on maintenance Alimta and Keytruda every 3 weeks received a total treatment of 11 cycles. The patient has a rough time with this treatment with frequent hospitalization recently with increasing fatigue and weakness especially in the lower extremities.  She also had pancytopenia on the previous admission.  She was admitted to the hospital at this time on 07/14/2023 with mental status change and she was found to have acute hepatic encephalopathy.  This is likely secondary to progressive disease in her liver that was seen on previous imaging studies. I had a lengthy discussion with the patient his son by phone today about her current condition and treatment options.  I I tried to call her husband Mr. Cooper but I was unable to reach him but the son will deliver my recommendation to him.  I do not think the patient will be a good candidate for any additional systemic therapy.  The best option for her at this point is palliative care on hospice/comfort care. The son is in agreement with the current plan and he was appreciative for the care she received at the cancer center. Thank you for taking good care of Ms. Harr.  Please call if you have any  questions. Disclaimer: This note was dictated with voice recognition software. Similar sounding words can inadvertently be transcribed and may be missed upon review. Lajuana Matte, MD

## 2023-07-19 DIAGNOSIS — K7682 Hepatic encephalopathy: Secondary | ICD-10-CM

## 2023-07-19 DIAGNOSIS — C3492 Malignant neoplasm of unspecified part of left bronchus or lung: Secondary | ICD-10-CM

## 2023-07-19 DIAGNOSIS — Z515 Encounter for palliative care: Secondary | ICD-10-CM | POA: Diagnosis not present

## 2023-07-19 DIAGNOSIS — Z7189 Other specified counseling: Secondary | ICD-10-CM | POA: Diagnosis not present

## 2023-07-19 DIAGNOSIS — C787 Secondary malignant neoplasm of liver and intrahepatic bile duct: Secondary | ICD-10-CM

## 2023-07-19 LAB — BASIC METABOLIC PANEL
Anion gap: 4 — ABNORMAL LOW (ref 5–15)
BUN: 15 mg/dL (ref 6–20)
CO2: 41 mmol/L — ABNORMAL HIGH (ref 22–32)
Calcium: 9.8 mg/dL (ref 8.9–10.3)
Chloride: 97 mmol/L — ABNORMAL LOW (ref 98–111)
Creatinine, Ser: 1.07 mg/dL — ABNORMAL HIGH (ref 0.44–1.00)
GFR, Estimated: >60 mL/min (ref >60)
Glucose, Bld: 155 mg/dL — ABNORMAL HIGH (ref 70–99)
Potassium: 3.5 mmol/L (ref 3.5–5.1)
Sodium: 142 mmol/L (ref 135–145)

## 2023-07-19 LAB — CULTURE, BLOOD (ROUTINE X 2)
Culture: NO GROWTH
Culture: NO GROWTH
Special Requests: ADEQUATE
Special Requests: ADEQUATE

## 2023-07-19 LAB — CBC
HCT: 33.2 % — ABNORMAL LOW (ref 36.0–46.0)
Hemoglobin: 10.3 g/dL — ABNORMAL LOW (ref 12.0–15.0)
MCH: 33.6 pg (ref 26.0–34.0)
MCHC: 31 g/dL (ref 30.0–36.0)
MCV: 108.1 fL — ABNORMAL HIGH (ref 80.0–100.0)
Platelets: 83 10*3/uL — ABNORMAL LOW (ref 150–400)
RBC: 3.07 MIL/uL — ABNORMAL LOW (ref 3.87–5.11)
RDW: 15.9 % — ABNORMAL HIGH (ref 11.5–15.5)
WBC: 7.3 10*3/uL (ref 4.0–10.5)
nRBC: 0 % (ref 0.0–0.2)

## 2023-07-19 MED ORDER — GLYCOPYRROLATE 0.2 MG/ML IJ SOLN: 0.2000 mg | INTRAMUSCULAR | Status: DC | PRN

## 2023-07-19 MED ORDER — HALOPERIDOL 1 MG PO TABS: 0.5000 mg | ORAL_TABLET | ORAL | Status: DC | PRN

## 2023-07-19 MED ORDER — POLYVINYL ALCOHOL 1.4 % OP SOLN: 1.0000 [drp] | Freq: Four times a day (QID) | OPHTHALMIC | Status: DC | PRN

## 2023-07-19 MED ORDER — HALOPERIDOL LACTATE 5 MG/ML IJ SOLN
0.5000 mg | INTRAMUSCULAR | Status: DC | PRN
  Administered 2023-07-20: 0.5 mg via INTRAVENOUS
  Filled 2023-07-19: qty 1

## 2023-07-19 MED ORDER — ACETAMINOPHEN 325 MG PO TABS: 650.0000 mg | ORAL_TABLET | Freq: Four times a day (QID) | ORAL | Status: DC | PRN

## 2023-07-19 MED ORDER — HALOPERIDOL LACTATE 2 MG/ML PO CONC: 0.5000 mg | ORAL | Status: DC | PRN

## 2023-07-19 MED ORDER — GLYCOPYRROLATE 1 MG PO TABS: 1.0000 mg | ORAL_TABLET | ORAL | Status: DC | PRN

## 2023-07-19 MED ORDER — BIOTENE DRY MOUTH MT LIQD: 15.0000 mL | OROMUCOSAL | Status: DC | PRN

## 2023-07-19 MED ORDER — ACETAMINOPHEN 650 MG RE SUPP: 650.0000 mg | Freq: Four times a day (QID) | RECTAL | Status: DC | PRN

## 2023-07-19 MED ORDER — MORPHINE SULFATE (PF) 2 MG/ML IV SOLN
1.0000 mg | INTRAVENOUS | Status: DC | PRN
  Administered 2023-07-20 (×4): 2 mg via INTRAVENOUS
  Filled 2023-07-19 (×4): qty 1

## 2023-07-19 NOTE — TOC Progression Note (Signed)
 Transition of Care [facility]) - Progression Note    Patient Details  Name: Madison Palmer MRN: [id number] Date of Birth: March 17, 1966  [facility] CM/SW Contact  Dellie Burns [employer], [place] Phone Number: 07/19/2023, 11:37 AM  Clinical Narrative:  Per Palliative Care APP pt's son and estranged husband agreeable to comfort care and hospice home placement. SW spoke to pt's son and husband, they confirm agreeable to hospice home placement and request [address] or [facility]. Referral made to both facilities, will provide updates as available.   Dellie Burns, MSW, LCSW [phone] (coverage)           Expected Discharge Plan and Services         Expected Discharge Date: 07/19/23                                     Social Determinants of Health (SDOH) Interventions SDOH Screenings   Food Insecurity: No Food Insecurity (07/14/2023)  Housing: Low Risk  (07/14/2023)  Transportation Needs: No Transportation Needs (07/14/2023)  Utilities: Not At Risk (07/14/2023)  Alcohol Screen: Low Risk  (09/13/2018)  Depression (PHQ2-9): Low Risk  (03/15/2023)  Financial Resource Strain: Low Risk  (07/03/2022)  Physical Activity: Inactive (07/03/2022)  Social Connections: Unknown (11/11/2018)   Received from [facility], [facility]  Stress: No Stress Concern Present (07/03/2022)  Tobacco Use: Low Risk  (07/14/2023)    Readmission Risk Interventions    06/15/2023    9:50 AM 06/07/2023    1:53 PM  Readmission Risk Prevention Plan  Transportation Screening Complete Complete  PCP or Specialist Appt within 5-7 Days  Complete  PCP or Specialist Appt within 3-5 Days Complete   Home Care Screening  Complete  Medication Review (RN CM)  Complete  HRI or Home Care Consult Complete   Social Work Consult for Recovery Care Planning/Counseling Complete   Palliative Care Screening Not Applicable   Medication Review Oceanographer)  Complete

## 2023-07-19 NOTE — Progress Notes (Signed)
 [facility] night cross cover note:   I was notified by RN that the patient remains lethargic, with diminished responsiveness, similar to her mental status during dayshift, per review of day shift documentation. Evening medications being held due to associated concern regarding patient's ability to safely swallow at this time in the context of current mental status.     Newton Pigg, DO Hospitalist

## 2023-07-19 NOTE — Progress Notes (Signed)
 [facility] Liaison Note   Received request from [facility] for family interest in [facility].   Visited patient and chart reviewed. Contacted son to discuss further. Patient's son requested to contact me tomorrow to discuss further when his father is also available.   Please do not hesitate to call with questions.   Glenna Fellows, BSN, RN, [employer] [facility] Liaison [phone]

## 2023-07-19 NOTE — Progress Notes (Signed)
   Referral was received from Case management to meet with the pt family to discuss hospice services at the [facility] in [facility]. I have reached out to the pt's son- Olena Leatherwood and he has requested that we meet tomorrow and will confirm the time and let me know. He states he would like for his father to be apart of the conversation and will be in from [place] this evening.   Norm Parcel RN

## 2023-07-19 NOTE — Discharge Summary (Signed)
 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  As per report, she is usually alert and oriented but family found her minimally responsive.  CT head did not show any acute findings.  Hospital course remarkable for agitation, worsening confusion.  Ammonia level elevated.  There was discussion about transitioning her care to comfort at nursing facility.  Oncology consulted and recommending comfort care/hospice approach.  Palliative   consulted for goals of care.  Now converted to full comfort care.  Plan for discharge to residential hospice whenever possible.  Following problems were addressed during the hospitalization:   Acute metabolic encephalopathy: Presented with altered mentation, agitation, decreased responsiveness.  CT head did not show any acute findings.  Also thought to be secondary to elevated ammonia level, possible UTI.  UDS positive for benzos.  Vitamin B12, folic acid level normal.  Given Ativan, Haldol as well as Zyprexa.  Unclear etiology.  MRI of the brain ordered to rule out metastatic brain lesions from lung cancer but she cudnt tolerate .I had a long discussion with her husband Mr. Excell Seltzer who is currently separated from her and lives in [place].  Mr. Excell Seltzer states this acute change in the mentation is not new for her.  She has been intermittently confused and agitated since last several years.  Now on comfort care   Stage  IV adenocarcinoma of the lung with mets to liver: Completed last chemotherapy on January 25.  Follows with Dr. Arbutus Ped.  Now on comfort care  Other chronic medical problems:   Chronic diastolic CHF,Hypertension,CKD stage IIIa,Hyperlipidemia,  Bipolar disorder/depression,Hypothyroidism  Discharge Diagnoses:  Principal Problem:   Acute hepatic encephalopathy (HCC) Active Problems:   Metastatic adenocarcinoma to liver (HCC)   Adenocarcinoma of left lung, stage 4 (HCC)   Acute on chronic diastolic CHF (congestive heart failure) (HCC)   Essential hypertension   Chronic kidney disease, stage III (moderate) (HCC)   Hyperlipidemia associated with type 2 diabetes mellitus (HCC)   Schizoaffective disorder, bipolar type (HCC)   Hypothyroidism   GERD (gastroesophageal reflux disease)    Discharge Instructions  Discharge Instructions     Diet general   Complete by: As directed    No wound care   Complete by: As directed       Allergies as of 07/20/2023       Reactions   Other Other (See Comments)   Hydralazine Hcl Other reaction(s): Other (See Comments) Instructed not to take by Cardiology.   Fentanyl Hives   Other reaction(s): hives had fentanyl recently with benadryl, did OK   Levofloxacin Hives   Midazolam Hives   Pollen Extract    seasonal   Atorvastatin Other (See Comments)   Muscle pain in legs  Abdominal pain   Hydralazine Hcl Other (See Comments)   Hypercalcemia    Rosuvastatin Other (See Comments)   Abdominal pain Other reaction(s): diarrhea        Medication List     STOP taking these medications    acetaminophen 500 MG tablet Commonly known as: TYLENOL   amLODipine 10 MG  tablet Commonly known as: NORVASC   atorvastatin 20 MG tablet Commonly known as: LIPITOR   benztropine 0.5 MG tablet Commonly known as: COGENTIN   cetirizine 10 MG tablet Commonly known as: ZYRTEC   divalproex 125 MG DR tablet Commonly known as: DEPAKOTE   fenofibrate  145 MG tablet Commonly known as: TRICOR   folic acid 1 MG tablet Commonly known as: FOLVITE   furosemide 40 MG tablet Commonly known as: LASIX   labetalol 300 MG tablet Commonly known as: NORMODYNE   levothyroxine 25 MCG tablet Commonly known as: SYNTHROID   lurasidone 40 MG Tabs tablet Commonly known as: Latuda   omeprazole 20 MG capsule Commonly known as: PRILOSEC   oxyCODONE 5 MG/5ML solution Commonly known as: ROXICODONE   Potassium Chloride ER 20 MEQ Tbcr   predniSONE 20 MG tablet Commonly known as: DELTASONE        Allergies  Allergen Reactions   Other Other (See Comments)    Hydralazine Hcl Other reaction(s): Other (See Comments) Instructed not to take by Cardiology.   Fentanyl Hives    Other reaction(s): hives had fentanyl recently with benadryl, did OK   Levofloxacin Hives   Midazolam Hives   Pollen Extract     seasonal   Atorvastatin Other (See Comments)    Muscle pain in legs  Abdominal pain   Hydralazine Hcl Other (See Comments)    Hypercalcemia    Rosuvastatin Other (See Comments)    Abdominal pain Other reaction(s): diarrhea    Consultations: Palliative care, oncology   Procedures/Studies: DG CHEST PORT 1 VIEW Result Date: 07/14/2023 CLINICAL DATA:  [id number] Encounter for feeding tube placement [id number] EXAM: PORTABLE CHEST 1 VIEW COMPARISON:  Same-day x-ray FINDINGS: Enteric tube extends below the diaphragm with distal tip and side port projecting within the gastric body. The tube proximally appears to be coiled within the hypopharynx. stable heart size. Similar bilateral airspace opacities. No pneumothorax. IMPRESSION: Enteric tube extends below the diaphragm with distal tip and side port projecting within the gastric body. The tube proximally appears to be coiled within the hypopharynx. Frontal and lateral radiographs of the neck could be obtained to confirm. Electronically Signed   By: Duanne Guess D.O.   On: 07/14/2023 19:04   DG Abd  1 View Result Date: 07/14/2023 CLINICAL DATA:  Feeding tube placement EXAM: ABDOMEN - 1 VIEW COMPARISON:  Radiograph same day FINDINGS: The tip of the feeding tube is in the esophagus below the carina and above the GE junction. The mid section of the tube is coiled in the upper esophagus and hypopharynx. IMPRESSION: Midsection of the feeding tube is coiled in the hypopharynx and upper esophagus. The tip of the tube is in the distal esophagus. Recommend repositioning. Electronically Signed   By: Genevive Bi M.D.   On: 07/14/2023 19:02   DG Abd Portable 1 View Result Date: 07/14/2023 CLINICAL DATA:  NG tube placement. EXAM: PORTABLE ABDOMEN - 1 VIEW COMPARISON:  None Available. FINDINGS: Tip of the NG tube passes below the carotid, into the distal esophagus, but above the hemidiaphragm. This will need to be further inserted 15-20 cm. IMPRESSION: NG tube tip in the distal esophagus. Recommend advancing 15-20 cm. Electronically Signed   By: Amie Portland M.D.   On: 07/14/2023 17:21   DG Abd Portable 1 View Result Date: 07/14/2023 CLINICAL DATA:  Enteric catheter placement EXAM: PORTABLE ABDOMEN - 1 VIEW COMPARISON:  07/14/2023 chest x-ray FINDINGS: Frontal view of the lower chest and upper abdomen demonstrates  enteric catheter tip projecting over the lower central mediastinum, tip at least 9 cm proximal to the gastroesophageal junction. Nonspecific bowel gas pattern. No acute bony abnormalities. IMPRESSION: 1. Enteric catheter tip projecting over the distal thoracic esophagus, at least 9 cm proximal to the gastroesophageal junction. These results will be called to the ordering clinician or representative by the Radiologist Assistant, and communication documented in the PACS or [employer]. Electronically Signed   By: Sharlet Salina M.D.   On: 07/14/2023 16:11   CT Head Wo Contrast Result Date: 07/14/2023 CLINICAL DATA:  Provided history: Mental status change, unknown cause. Hypernatremia. EXAM: CT  HEAD WITHOUT CONTRAST TECHNIQUE: Contiguous axial images were obtained from the base of the skull through the vertex without intravenous contrast. RADIATION DOSE REDUCTION: This exam was performed according to the departmental dose-optimization program which includes automated exposure control, adjustment of the mA and/or kV according to patient size and/or use of iterative reconstruction technique. COMPARISON:  CT angiogram head/neck 06/29/2023. Head CT 06/29/2023. Brain MRI 06/07/2023. FINDINGS: Brain: No age-advanced or lobar predominant cerebral atrophy. Partially empty sella turcica. There is no acute intracranial hemorrhage. No demarcated cortical infarct. No extra-axial fluid collection. No evidence of an intracranial mass. No midline shift. Vascular: No hyperdense vessel.  Atherosclerotic calcifications. Skull: No calvarial fracture or aggressive osseous lesion. Sinuses/Orbits: No mass or acute finding within the imaged orbits. Post-surgical appearance of the paranasal sinuses. Minimal mucosal thickening within the right maxillary sinus at the imaged levels. Minimal mucosal thickening or small mucous retention cyst within the left maxillary sinus at the imaged levels. Minimal mucosal thickening within the left sphenoid and bilateral ethmoid sinuses. IMPRESSION: 1. No evidence of an acute intracranial abnormality. 2. Partially empty sella turcica. This finding can reflect incidental anatomic variation, or alternatively, it can be associated with chronic idiopathic intracranial hypertension (pseudotumor cerebri). 3. Paranasal sinus disease at the imaged levels, as described. Electronically Signed   By: Jackey Loge D.O.   On: 07/14/2023 13:17   DG Chest Port 1 View Result Date: 07/14/2023 CLINICAL DATA:  Altered mental status EXAM: PORTABLE CHEST 1 VIEW COMPARISON:  06/08/2023 FINDINGS: Low volume chest with indistinct density behind the heart. Small volume bilateral pleural effusion. Underestimated  pulmonary nodularity compared to prior CT. Cardiac enlargement. IMPRESSION: Bilateral pleural effusion and retrocardiac airspace disease similar to prior. Electronically Signed   By: Tiburcio Pea M.D.   On: 07/14/2023 11:36   CT ANGIO HEAD NECK W WO CM Result Date: 06/29/2023 CLINICAL DATA:  Acute onset of severe headache. Abnormal CT of the head. EXAM: CT ANGIOGRAPHY HEAD AND NECK WITH AND WITHOUT CONTRAST TECHNIQUE: Multidetector CT imaging of the head and neck was performed using the standard protocol during bolus administration of intravenous contrast. Multiplanar CT image reconstructions and MIPs were obtained to evaluate the vascular anatomy. Carotid stenosis measurements (when applicable) are obtained utilizing NASCET criteria, using the distal internal carotid diameter as the denominator. RADIATION DOSE REDUCTION: This exam was performed according to the departmental dose-optimization program which includes automated exposure control, adjustment of the mA and/or kV according to patient size and/or use of iterative reconstruction technique. CONTRAST:  80mL OMNIPAQUE IOHEXOL 350 MG/ML SOLN COMPARISON:  CT head without contrast 06/29/23. MR head without and with contrast 05/22/2023 FINDINGS: CTA NECK FINDINGS Aortic arch: The 3 vessel arch configuration is present. Atherosclerotic calcifications are present in the distal arch and at the origins of the innominate and left subclavian arteries. No focal stenosis or aneurysm is present. No dissection is  present. Right carotid system: The right common carotid artery is within normal limits. Atherosclerotic changes are present in the proximal right ICA without a significant stenosis relative to the more distal vessel. Left carotid system: Left common carotid artery is within normal limits. Atherosclerotic calcifications are present at the bifurcation. Mild tortuosity is present in the cervical left ICA without focal stenosis. Vertebral arteries: The vertebral  arteries are codominant. Both vertebral arteries originate from the subclavian arteries without significant stenosis. No significant stenosis is present in either vertebral artery in the neck. Skeleton: Degenerative changes are present in the lower cervical spine. 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 Straightening and slight reversal of the normal cervical lordosis is present. Other neck: The soft tissues of the neck are otherwise unremarkable. Salivary glands are within normal limits. Thyroid is normal. No significant adenopathy is present. No focal mucosal or submucosal lesions are present. Upper chest: A left pleural effusion is present. Patchy airspace opacities are present in the right upper lobe right upper lobe nodule measures 10 x 7 mm on image 16 of series 5. Other smaller nodules are present. A nodule near the apex the right lung measures 6 mm on image 37 of series 5 Review of the MIP images confirms the above findings CTA HEAD FINDINGS Anterior circulation: Atherosclerotic calcifications are present within the cavernous internal carotid arteries bilaterally without significant stenosis. An enlarged empty sella is present. The right cavernous internal carotid artery deviates medially into the sella without aneurysm. The A1 and M1 segments are normal. The anterior communicating artery is patent. MCA bifurcations are normal. The ACA and MCA branch vessels are normal bilaterally. Posterior circulation: Atherosclerotic calcifications are present at the dural margin of both vertebral arteries. The vertebrobasilar junction and basilar artery is normal. The superior cerebellar arteries are patent. Both posterior cerebral arteries originate from basilar tip. The PCA branch vessels are normal bilaterally. Venous sinuses: The dural sinuses are patent. The straight sinus and deep cerebral veins are intact. Cortical veins are within normal limits. No  significant vascular malformation is evident. Anatomic variants: None Review of the MIP images confirms the above findings IMPRESSION: 1. No acute vascular abnormality or significant vascular malformation. 2. Atherosclerotic changes at the carotid bifurcations and cavernous internal carotid arteries bilaterally without significant stenosis. 3. Medial deviation of the cavernous right ICA without significant stenosis. 4. 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 5. Left pleural effusion. 6. Patchy airspace opacities and nodular disease in the right upper lobe likely represents infection. Neoplasm is considered less likely. Non-contrast chest CT at 3-6 months is recommended. If the nodules are stable at time of repeat CT, then future CT at 18-24 months (from today's scan) is considered optional for low-risk patients, but is recommended for high-risk patients. This recommendation follows the consensus statement: Guidelines for Management of Incidental Pulmonary Nodules Detected on CT Images: From the Fleischner Society 2017; Radiology 2017; 284:228-243. These results were called by telephone at the time of interpretation on 06/29/2023 at 8:08 pm to provider Dr. Wilkie Aye, who verbally acknowledged these results. Electronically Signed   By: Marin Roberts M.D.   On: 06/29/2023 20:09   CT Head Wo Contrast Result Date: 06/29/2023 CLINICAL DATA:  New onset headache. EXAM: CT HEAD WITHOUT CONTRAST TECHNIQUE: Contiguous axial images were obtained from the base of the skull through the vertex without intravenous contrast. RADIATION DOSE REDUCTION: This exam was performed according to  the departmental dose-optimization program which includes automated exposure control, adjustment of the mA and/or kV according to patient size and/or use of iterative reconstruction technique. COMPARISON:  MRI head 06/07/2023 FINDINGS: Brain: No acute intracranial hemorrhage. No  CT evidence of acute infarct. No edema, mass effect, or midline shift. The basilar cisterns are patent. Ventricles: The ventricles are normal. Vascular: Atherosclerotic calcifications of the bilateral carotid siphons. Prominent medial course of the right cavernous ICA. No hyperdense vessel. Skull: No acute or aggressive finding. Orbits: Orbits are symmetric. Sinuses: Mild mucosal thickening in the ethmoid and maxillary sinuses. Other: Mastoid air cells are clear. Postsurgical changes of the turbinates. IMPRESSION: 1. No CT evidence of acute intracranial abnormality. 2. Prominent medial course of the right cavernous ICA potentially exerting mass effect on the pituitary. Consider CTA for further evaluation. Electronically Signed   By: Emily Filbert M.D.   On: 06/29/2023 14:49      Subjective: Patient seen and examined at the bedside today.  She is very drowsy, sleepy today.  Opens eyes only on calling her name.  Appears lethargic.  Discharge Exam: Vitals:   07/19/23 1707 07/20/23 0517  BP: 121/84 (!) 155/116  Pulse: (!) 103   Resp:  18  Temp:  98.3 F (36.8 C)  SpO2: 98% (!) 86%   Vitals:   07/19/23 0212 07/19/23 0500 07/19/23 1707 07/20/23 0517  BP: 136/78 (!) 139/98 121/84 (!) 155/116  Pulse: 82 81 (!) 103   Resp: 18   18  Temp: 98.9 F (37.2 C) 98.7 F (37.1 C)  98.3 F (36.8 C)  TempSrc:  Oral    SpO2: 100% 100% 98% (!) 86%  Weight:      Height:        General: Lying in bed, lethargic, sleepy, drowsy, morbidly obese Cardiovascular: RRR, S1/S2 +, no rubs, no gallops Respiratory: Diminished sounds bilaterally Abdominal: Soft, NT, ND, bowel sounds + Extremities: Bilateral nonpitting edema on lower extremities, no cyanosis    The results of significant diagnostics from this hospitalization (including imaging, microbiology, ancillary and laboratory) are listed below for reference.     Microbiology: Recent Results (from the past 240 hours)  Resp panel by RT-PCR (RSV, Flu  A&B, Covid) Anterior Nasal Swab     Status: None   Collection Time: 07/14/23 10:59 AM   Specimen: Anterior Nasal Swab  Result Value Ref Range Status   SARS Coronavirus 2 by RT PCR NEGATIVE NEGATIVE Final   Influenza A by PCR NEGATIVE NEGATIVE Final   Influenza B by PCR NEGATIVE NEGATIVE Final    Comment: (NOTE) The Xpert Xpress SARS-CoV-2/FLU/RSV plus assay is intended as an aid in the diagnosis of influenza from Nasopharyngeal swab specimens and should not be used as a sole basis for treatment. Nasal washings and aspirates are unacceptable for Xpert Xpress SARS-CoV-2/FLU/RSV testing.  Fact Sheet for Patients: BloggerCourse.com  Fact Sheet for Healthcare Providers: SeriousBroker.it  This test is not yet approved or cleared by the [place] FDA and has been authorized for detection and/or diagnosis of SARS-CoV-2 by FDA under an Emergency Use Authorization (EUA). This EUA will remain in effect (meaning this test can be used) for the duration of the COVID-19 declaration under Section 564(b)(1) of the Act, 21 U.S.C. section 360bbb-3(b)(1), unless the authorization is terminated or revoked.     Resp Syncytial Virus by PCR NEGATIVE NEGATIVE Final    Comment: (NOTE) Fact Sheet for Patients: BloggerCourse.com  Fact Sheet for Healthcare Providers: SeriousBroker.it  This test is not yet  approved or cleared by the [place] and has been authorized for detection and/or diagnosis of SARS-CoV-2 by FDA under an Emergency Use Authorization (EUA). This EUA will remain in effect (meaning this test can be used) for the duration of the COVID-19 declaration under Section 564(b)(1) of the Act, 21 U.S.C. section 360bbb-3(b)(1), unless the authorization is terminated or revoked.  Performed at [facility], [address] [place], [place] [address]   Culture, blood (routine x 2)      Status: None   Collection Time: 07/14/23 11:04 AM   Specimen: BLOOD LEFT WRIST  Result Value Ref Range Status   Specimen Description BLOOD LEFT WRIST  Final   Special Requests   Final    BOTTLES DRAWN AEROBIC AND ANAEROBIC Blood Culture adequate volume   Culture   Final    NO GROWTH 5 DAYS Performed at [facility], [address], [place], [place] [address]    Report Status 07/19/2023 FINAL  Final  Culture, blood (routine x 2)     Status: None   Collection Time: 07/14/23 12:19 PM   Specimen: BLOOD LEFT ARM  Result Value Ref Range Status   Specimen Description BLOOD LEFT ARM  Final   Special Requests   Final    BOTTLES DRAWN AEROBIC AND ANAEROBIC Blood Culture adequate volume   Culture   Final    NO GROWTH 5 DAYS Performed at [facility], [address], [place], [place] [address]    Report Status 07/19/2023 FINAL  Final     Labs: BNP (last 3 results) Recent Labs    07/14/23 1138  BNP 175.5*   Basic Metabolic Panel: Recent Labs  Lab 07/15/23 0256 07/16/23 0329 07/17/23 0328 07/18/23 0324 07/19/23 0429  NA 143 144 147* 141 142  K 4.0 3.5 3.6 3.0* 3.5  CL 93* 94* 97* 93* 97*  CO2 35* 40* 42* 38* 41*  GLUCOSE 95 171* 166* 145* 155*  BUN 24* 19 17 16 15   CREATININE 1.22* 1.24* 1.18* 1.09* 1.07*  CALCIUM 10.4* 10.1 10.1 10.0 9.8  MG  --   --   --  1.7  --    Liver Function Tests: Recent Labs  Lab 07/14/23 1138 07/15/23 0256 07/16/23 0329  AST 36 72* 90*  ALT 28 50* 77*  ALKPHOS 99 130* 140*  BILITOT 1.1 2.1* 2.0*  PROT 5.2* 5.6* 5.9*  ALBUMIN 2.6* 2.7* 2.7*   Recent Labs  Lab 07/14/23 1138  LIPASE 130*   Recent Labs  Lab 07/14/23 1138 07/16/23 0329 07/17/23 0328 07/18/23 0324  AMMONIA 84* 36* 53* 31   CBC: Recent Labs  Lab 07/14/23 1138 07/14/23 1151 07/15/23 0256 07/16/23 0329 07/17/23 0328 07/18/23 0324 07/19/23 0429  WBC 10.7*  --  12.2* 13.6* 11.6* 11.3* 7.3  NEUTROABS 9.6*  --   --   --   --   --   --   HGB  10.2*   < > 11.1* 11.6* 11.1* 10.9* 10.3*  HCT 33.3*   < > 35.0* 36.7 35.5* 34.6* 33.2*  MCV 110.3*  --  107.4* 107.3* 107.6* 106.8* 108.1*  PLT 132*  --  152 160 123* 111* 83*   < > = values in this interval not displayed.   Cardiac Enzymes: Recent Labs  Lab 07/14/23 1138  CKTOTAL 34*   BNP: Invalid input(s): "POCBNP" CBG: Recent Labs  Lab 07/15/23 0804  GLUCAP 88   D-Dimer No results for input(s): "DDIMER" in  the last 72 hours. Hgb A1c No results for input(s): "HGBA1C" in the last 72 hours. Lipid Profile No results for input(s): "CHOL", "HDL", "LDLCALC", "TRIG", "CHOLHDL", "LDLDIRECT" in the last 72 hours. Thyroid function studies No results for input(s): "TSH", "T4TOTAL", "T3FREE", "THYROIDAB" in the last 72 hours.  Invalid input(s): "FREET3" Anemia work up No results for input(s): "VITAMINB12", "FOLATE", "FERRITIN", "TIBC", "IRON", "RETICCTPCT" in the last 72 hours. Urinalysis    Component Value Date/Time   COLORURINE YELLOW 07/14/2023 1533   APPEARANCEUR CLEAR 07/14/2023 1533   APPEARANCEUR Clear 03/08/2023 0000   LABSPEC 1.015 07/14/2023 1533   PHURINE 6.0 07/14/2023 1533   GLUCOSEU NEGATIVE 07/14/2023 1533   HGBUR NEGATIVE 07/14/2023 1533   BILIRUBINUR NEGATIVE 07/14/2023 1533   BILIRUBINUR Negative 03/08/2023 0000   KETONESUR NEGATIVE 07/14/2023 1533   PROTEINUR NEGATIVE 07/14/2023 1533   UROBILINOGEN 1.0 05/21/2018 1332   NITRITE POSITIVE (A) 07/14/2023 1533   LEUKOCYTESUR LARGE (A) 07/14/2023 1533   Sepsis Labs Recent Labs  Lab 07/16/23 0329 07/17/23 0328 07/18/23 0324 07/19/23 0429  WBC 13.6* 11.6* 11.3* 7.3   Microbiology Recent Results (from the past 240 hours)  Resp panel by RT-PCR (RSV, Flu A&B, Covid) Anterior Nasal Swab     Status: None   Collection Time: 07/14/23 10:59 AM   Specimen: Anterior Nasal Swab  Result Value Ref Range Status   SARS Coronavirus 2 by RT PCR NEGATIVE NEGATIVE Final   Influenza A by PCR NEGATIVE NEGATIVE Final    Influenza B by PCR NEGATIVE NEGATIVE Final    Comment: (NOTE) The Xpert Xpress SARS-CoV-2/FLU/RSV plus assay is intended as an aid in the diagnosis of influenza from Nasopharyngeal swab specimens and should not be used as a sole basis for treatment. Nasal washings and aspirates are unacceptable for Xpert Xpress SARS-CoV-2/FLU/RSV testing.  Fact Sheet for Patients: BloggerCourse.com  Fact Sheet for Healthcare Providers: SeriousBroker.it  This test is not yet approved or cleared by the [place] FDA and has been authorized for detection and/or diagnosis of SARS-CoV-2 by FDA under an Emergency Use Authorization (EUA). This EUA will remain in effect (meaning this test can be used) for the duration of the COVID-19 declaration under Section 564(b)(1) of the Act, 21 U.S.C. section 360bbb-3(b)(1), unless the authorization is terminated or revoked.     Resp Syncytial Virus by PCR NEGATIVE NEGATIVE Final    Comment: (NOTE) Fact Sheet for Patients: BloggerCourse.com  Fact Sheet for Healthcare Providers: SeriousBroker.it  This test is not yet approved or cleared by the [place] FDA and has been authorized for detection and/or diagnosis of SARS-CoV-2 by FDA under an Emergency Use Authorization (EUA). This EUA will remain in effect (meaning this test can be used) for the duration of the COVID-19 declaration under Section 564(b)(1) of the Act, 21 U.S.C. section 360bbb-3(b)(1), unless the authorization is terminated or revoked.  Performed at [facility], [address], [place], [place] [address]   Culture, blood (routine x 2)     Status: None   Collection Time: 07/14/23 11:04 AM   Specimen: BLOOD LEFT WRIST  Result Value Ref Range Status   Specimen Description BLOOD LEFT WRIST  Final   Special Requests   Final    BOTTLES DRAWN AEROBIC AND ANAEROBIC Blood Culture  adequate volume   Culture   Final    NO GROWTH 5 DAYS Performed at [facility], [address], [place], [place] [address]    Report Status 07/19/2023 FINAL  Final  Culture,  blood (routine x 2)     Status: None   Collection Time: 07/14/23 12:19 PM   Specimen: BLOOD LEFT ARM  Result Value Ref Range Status   Specimen Description BLOOD LEFT ARM  Final   Special Requests   Final    BOTTLES DRAWN AEROBIC AND ANAEROBIC Blood Culture adequate volume   Culture   Final    NO GROWTH 5 DAYS Performed at [facility], [address] [place], [place] [address]    Report Status 07/19/2023 FINAL  Final    Please note: You were cared for by a hospitalist during your hospital stay. Once you are discharged, your primary care physician will handle any further medical issues. Please note that NO REFILLS for any discharge medications will be authorized once you are discharged, as it is imperative that you return to your primary care physician (or establish a relationship with a primary care physician if you do not have one) for your post hospital discharge needs so that they can reassess your need for medications and monitor your lab values.    Time coordinating discharge: 40 minutes  SIGNED:   Burnadette Pop, MD  [facility] 07/20/2023, 11:11 AM Pager [phone]  If 7PM-7AM, please contact night-coverage [email or web address] Password [email or web address]

## 2023-07-19 NOTE — Plan of Care (Signed)
   Problem: Clinical Measurements: Goal: Will remain free from infection Outcome: Progressing   Problem: Pain Managment: Goal: General experience of comfort will improve and/or be controlled Outcome: Progressing   Problem: Safety: Goal: Ability to remain free from injury will improve Outcome: Progressing

## 2023-07-19 NOTE — Consult Note (Signed)
 Palliative Care Consult Note                                  Date: 07/19/2023   Patient Name: Madison Palmer  DOB: 1966/01/24  MRN: [id number]  Age / Sex: 58 y.o., female  PCP: [name], [name], NP Referring Physician: Burnadette Pop, MD  Reason for Consultation: Establishing goals of care  HPI/Patient Profile: 58 y.o. 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  CT head negative for acute findings.  She is admitted with acute metabolic encephalopathy, elevated ammonia level, and suspected UTI.  Hospital course is significant for agitation and worsening confusion.  Patient last seen in outpatient oncology 05/29/2023.  Due to repeated hospitalizations and worsening mentation, chemotherapy was placed on hold  Palliative Medicine has been consulted for goals of care conversations.   Subjective:   Extensive chart review has been completed including labs, vital signs, imaging, progress/consult notes, orders, medications and available advance directive documents.   Patient assessed at bedside.  She is lethargic; briefly opens her eyes to voice but does not track.  Does not follow commands.   I spoke with her son/Jarett and her husband/Carlton (separately, by phone) to discuss diagnosis, prognosis, GOC, EOL wishes, disposition, and options.  I introduced Palliative Medicine as specialized medical care for people living with serious illness. It focuses on providing relief from the symptoms and stress of a serious illness.   Created space and opportunity for family to express thoughts and feelings regarding current medical situation. Values and goals of care were attempted  to be elicited.  Life Review: Patient is from [place], but relocated to [place] with her son. She is married, but separated from her husband who still lives in [place]. 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   GOC Discussion: We discussed patient's current illness and what it means in the larger context of her ongoing co-morbidities. Current clinical status was reviewed.   I reviewed with son and spouse that per oncology, patient is not a good candidate for any additional chemotherapy and recommendation is for hospice/comfort care.  I discussed the concept of comfort care, emphasizing that this path involves de-escalating full scope medical interventions and allowing a natural course to occur. Discussed that the goal is comfort and dignity rather than cure/prolonging life.   I shared my concern with family that patient is at end-of-life, likely within days to weeks. Discussed natural trajectory at EOL.   Family is agreeable to transition to comfort care at this time, and are interested in transfer to inpatient hospice facility.  We discussed discontinuing unnecessary interventions such as lab draws, cardiac monitoring, and IV fluids.  Family is clear that they do not want patient to "suffer" and want her to have a peaceful passing when that time comes.  Emotional support provided.  Review of Systems  Unable to perform ROS   Objective:   Primary Diagnoses: Present on Admission:  Acute hepatic encephalopathy (HCC)  Metastatic adenocarcinoma to liver [facility])  Adenocarcinoma of left lung, stage 4 (HCC)  Acute on chronic diastolic CHF (congestive heart failure) (HCC)  Hypothyroidism  Essential hypertension  Chronic kidney disease, stage III (  moderate) (HCC)  Hyperlipidemia associated with type 2 diabetes mellitus (HCC)  Schizoaffective disorder, bipolar type (HCC)  GERD (gastroesophageal reflux disease)   Physical Exam Vitals reviewed.  Constitutional:       General: She is not in acute distress.    Appearance: She is ill-appearing.  Pulmonary:     Effort: No respiratory distress.  Neurological:     Mental Status: She is lethargic.     Comments: Minimally responsive Does not follow commands     Vital Signs:  BP (!) 139/98 (BP Location: Right Leg)   Pulse 81   Temp 98.7 F (37.1 C) (Oral)   Resp 18   Ht 5\' 2"  (1.575 m)   Wt 119 kg   LMP 05/02/2017   SpO2 100%   BMI 47.98 kg/m   Palliative Assessment/Data: PPS 10-20%     Assessment & Plan:   SUMMARY OF RECOMMENDATIONS   Transition to comfort measures Discontinue labs and IV fluid Minimize medications Referral to inpatient hospice - TOC aware PMT will continue to support  Primary Decision Maker: NEXT OF KIN - husband/Carlton but he includes their son in all decisions  Code Status/Advance Care Planning: DNR - comfort  Symptom Management:  Morphine prn for pain or dyspnea Haloperidol (HALDOL) prn for agitation  Glycopyrrolate (ROBINUL) for excessive secretions Ondansetron (ZOFRAN) prn for nausea Polyvinyl alcohol (LIQUIFILM TEARS) prn for dry eyes Antiseptic oral rinse (BIOTENE) prn for dry mouth  Prognosis:  < 2 weeks  Discharge Planning:  To Be Determined     Thank you for allowing [place] to participate in the care of Mack L Kintz   Time Total: 78 minutes  Detailed review of medical records (labs, imaging, vital signs), medically appropriate exam, discussed with treatment team, counseling and education to family, & staff, documenting clinical information, medication management, coordination of care.   Signed by: Sherlean Foot, NP Palliative Medicine Team  Team Phone # [phone]  For individual providers, please see AMION

## 2023-07-20 DIAGNOSIS — Z515 Encounter for palliative care: Secondary | ICD-10-CM | POA: Diagnosis not present

## 2023-07-20 DIAGNOSIS — K7682 Hepatic encephalopathy: Secondary | ICD-10-CM | POA: Diagnosis not present

## 2023-07-20 DIAGNOSIS — C787 Secondary malignant neoplasm of liver and intrahepatic bile duct: Secondary | ICD-10-CM | POA: Diagnosis not present

## 2023-07-20 DIAGNOSIS — C3492 Malignant neoplasm of unspecified part of left bronchus or lung: Secondary | ICD-10-CM | POA: Diagnosis not present

## 2023-07-20 NOTE — TOC Progression Note (Signed)
 Transition of Care [facility] - Progression Note    Patient Details  Name: Madison Palmer MRN: [id number] Date of Birth: 1966/04/12  [facility]) CM/SW Contact  Dellie Burns [place], [place] Phone Number: 07/20/2023, 1:56 PM  Clinical Narrative: Per Cheri with [facility] pt has been approved for [facility] but there are no beds available. Spoke to pt's husband Les Pou who requests pt be evaluated by Civil engineer, contracting [facility]) for [facility] as they potentially have a bed available. Cheri with [facility] and Shawn with [facility] updated.     Dellie Burns, MSW, LCSW [phone] (coverage)           Expected Discharge Plan and Services         Expected Discharge Date: 07/19/23                                     Social Determinants of Health (SDOH) Interventions SDOH Screenings   Food Insecurity: No Food Insecurity (07/14/2023)  Housing: Low Risk  (07/14/2023)  Transportation Needs: No Transportation Needs (07/14/2023)  Utilities: Not At Risk (07/14/2023)  Alcohol Screen: Low Risk  (09/13/2018)  Depression (PHQ2-9): Low Risk  (03/15/2023)  Financial Resource Strain: Low Risk  (07/03/2022)  Physical Activity: Inactive (07/03/2022)  Social Connections: Unknown (11/11/2018)   [facility]  Stress: No Stress Concern Present (07/03/2022)  Tobacco Use: Low Risk  (07/14/2023)    Readmission Risk Interventions    06/15/2023    9:50 AM 06/07/2023    1:53 PM  Readmission Risk Prevention Plan  Transportation Screening Complete Complete  PCP or Specialist Appt within 5-7 Days  Complete  PCP or Specialist Appt within 3-5 Days Complete   Home Care Screening  Complete  Medication Review (RN CM)  Complete  HRI or Home Care Consult Complete   Social Work Consult for Recovery Care Planning/Counseling Complete   Palliative Care Screening Not Applicable   Medication Review Furniture conservator/restorer) Complete

## 2023-07-20 NOTE — Progress Notes (Signed)
 Palliative Medicine Progress Note   Patient Name: TANAYSIA BHARDWAJ       Date: 07/20/2023 DOB: 14-Feb-1966  Age: 58 y.o. MRN#: [id number] Attending Physician: Burnadette Pop, MD Primary Care Physician: Anne Ng, NP Admit Date: 07/14/2023   HPI/Patient Profile: 58 y.o. 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  CT head negative for acute findings.  She is admitted with acute metabolic encephalopathy, elevated ammonia level, and suspected UTI.  Hospital course is significant for agitation and worsening confusion.   Patient last seen in outpatient oncology 05/29/2023.  Due to repeated hospitalizations and worsening mentation, chemotherapy was placed on hold   Palliative Medicine has been consulted for goals of care conversations.  Subjective: Chart reviewed. Patient assessed at bedside. She appears comfortable - currently sleeping and I did not attempt to wake her. No non-verbal signs of pain or discomfort noted. Respirations are even and unlabored. No excessive respiratory secretions noted. Her friend/Tonya is present at bedside. Discussed natural trajectory at EOL. Emotional support provided.      Objective:  Physical Exam Vitals reviewed.  Constitutional:      General: She is not in acute distress.    Appearance: She is ill-appearing.  Pulmonary:     Effort: No respiratory distress.  Skin:    General: Skin is warm and dry.  Neurological:     Mental Status: She is lethargic.             Palliative  Medicine Assessment & Plan   Assessment: Principal Problem:   Acute hepatic encephalopathy (HCC) Active Problems:   GERD (gastroesophageal reflux disease)   Hypothyroidism   Essential hypertension   Hyperlipidemia associated with type 2 diabetes mellitus (HCC)   Acute on chronic diastolic CHF (congestive heart failure) (HCC)   Schizoaffective disorder, bipolar type (HCC)   Adenocarcinoma of left lung, stage 4 (HCC)   Metastatic adenocarcinoma to liver (HCC)   Chronic kidney disease, stage III (moderate) (HCC)    Recommendations/Plan: Continue comfort care PRN medications are available for symptom management at EOL Transfer to hospice facility in [facility] when  bed is available PMT will continue to follow  Code Status: DNR - comfort   Prognosis:  < 2 weeks  Discharge Planning: Hospice facility   Thank you for allowing the Palliative Medicine Team to assist in the care of this patient.   Time: 25 minutes   Merry Proud, NP   Please contact Palliative Medicine Team phone at [phone] for questions and concerns.  For individual providers, please see AMION.

## 2023-07-20 NOTE — Progress Notes (Signed)
   I was able to connect with the pt's spouse this morning to discuss hospice services. He is interested in the bed at our facility and has accepted this when we can accommodate the pt. Unfortunately at this time we do not have a bed to offer.   My MD has approved the pt for hospice care at our facility and as soon as a bed becomes available I will reach out to [facility] to update and proceed with d/c plans.   Norm Parcel RN [phone]

## 2023-07-20 NOTE — Progress Notes (Signed)
 Patient seen and examined at the bedside today.  She is on full comfort care.  Plan is to discharge her to residential hospice.  No change in the medical management.  Discharge orders and summary are already placed on 3/20 TOC following

## 2023-07-20 NOTE — TOC Transition Note (Signed)
 Transition of Care [facility]) - Discharge Note   Patient Details  Name: Madison Palmer MRN: [id number] Date of Birth: 16-Mar-1966  [facility]) CM/SW Contact:  Deatra Robinson, [place] Phone Number: 07/20/2023, 4:04 PM   Clinical Narrative:   Pt has been accepted to [facility] and they are prepared to admit today. Pt's husband Madison Palmer aware of dc and reports agreeable. RN to call for transport once [facility] liaison confirms family has completed admission consents. SW signing off at dc.   Dellie Burns, MSW, LCSW [phone] (coverage)      Final next level of care: Hospice Medical Facility Barriers to Discharge: Barriers Resolved   Patient Goals and CMS Choice     Choice offered to / list presented to : Spouse [place] ownership interest in [facility].provided to:: Spouse    Discharge Placement                Patient to be transferred to facility by: [facility] Name of family member notified: Carlton/spouse Patient and family notified of of transfer: 07/20/23  Discharge Plan and Services Additional resources added to the After Visit Summary for                                       Social Drivers of Health (SDOH) Interventions SDOH Screenings   Food Insecurity: No Food Insecurity (07/14/2023)  Housing: Low Risk  (07/14/2023)  Transportation Needs: No Transportation Needs (07/14/2023)  Utilities: Not At Risk (07/14/2023)  Alcohol Screen: Low Risk  (09/13/2018)  Depression (PHQ2-9): Low Risk  (03/15/2023)  Financial Resource Strain: Low Risk  (07/03/2022)  Physical Activity: Inactive (07/03/2022)  Social Connections: Unknown (11/11/2018)   [facility]  Stress: No Stress Concern Present (07/03/2022)  Tobacco Use: Low Risk  (07/14/2023)     Readmission Risk Interventions    06/15/2023    9:50 AM 06/07/2023    1:53 PM  Readmission Risk Prevention Plan  Transportation  Screening Complete Complete  PCP or Specialist Appt within 5-7 Days  Complete  PCP or Specialist Appt within 3-5 Days Complete   Home Care Screening  Complete  Medication Review (RN CM)  Complete  HRI or Home Care Consult Complete   Social Work Consult for Recovery Care Planning/Counseling Complete   Palliative Care Screening Not Applicable   Medication Review Oceanographer) Complete

## 2023-07-20 NOTE — Progress Notes (Signed)
 Called report to [facility]. All questions/concerns were addressed.

## 2023-07-23 ENCOUNTER — Inpatient Hospital Stay: Admitting: Internal Medicine

## 2023-07-23 ENCOUNTER — Inpatient Hospital Stay

## 2023-07-23 ENCOUNTER — Ambulatory Visit: Admitting: Physician Assistant

## 2023-07-23 ENCOUNTER — Other Ambulatory Visit

## 2023-07-23 ENCOUNTER — Ambulatory Visit

## 2023-07-24 ENCOUNTER — Inpatient Hospital Stay: Payer: BC Managed Care – PPO

## 2023-07-24 ENCOUNTER — Other Ambulatory Visit: Payer: Self-pay | Admitting: Physician Assistant

## 2023-07-31 ENCOUNTER — Other Ambulatory Visit: Payer: BC Managed Care – PPO

## 2023-07-31 ENCOUNTER — Ambulatory Visit: Payer: BC Managed Care – PPO | Admitting: Internal Medicine

## 2023-07-31 ENCOUNTER — Ambulatory Visit: Payer: BC Managed Care – PPO

## 2023-07-31 DEATH — deceased

## 2023-08-07 ENCOUNTER — Other Ambulatory Visit: Payer: BC Managed Care – PPO

## 2023-08-07 ENCOUNTER — Ambulatory Visit: Payer: BC Managed Care – PPO | Admitting: Internal Medicine

## 2023-08-07 ENCOUNTER — Ambulatory Visit: Payer: BC Managed Care – PPO

## 2023-08-14 ENCOUNTER — Ambulatory Visit

## 2023-08-14 ENCOUNTER — Ambulatory Visit: Admitting: Physician Assistant

## 2023-08-14 ENCOUNTER — Other Ambulatory Visit

## 2023-08-28 ENCOUNTER — Ambulatory Visit: Payer: BC Managed Care – PPO

## 2023-08-28 ENCOUNTER — Ambulatory Visit: Payer: BC Managed Care – PPO | Admitting: Physician Assistant

## 2023-08-28 ENCOUNTER — Other Ambulatory Visit: Payer: BC Managed Care – PPO

## 2023-09-03 ENCOUNTER — Ambulatory Visit

## 2023-09-03 ENCOUNTER — Ambulatory Visit: Admitting: Physician Assistant

## 2023-09-03 ENCOUNTER — Other Ambulatory Visit

## 2023-09-26 ENCOUNTER — Ambulatory Visit: Admitting: Internal Medicine

## 2023-09-26 ENCOUNTER — Other Ambulatory Visit

## 2023-09-26 ENCOUNTER — Ambulatory Visit

## 2023-10-22 ENCOUNTER — Encounter: Payer: Self-pay | Admitting: Internal Medicine

## 2023-10-29 ENCOUNTER — Encounter: Payer: Self-pay | Admitting: Neurology

## 2023-10-29 ENCOUNTER — Institutional Professional Consult (permissible substitution): Payer: Self-pay | Admitting: Neurology

## 2023-10-30 ENCOUNTER — Encounter: Payer: Self-pay | Admitting: Internal Medicine

## 2023-10-31 ENCOUNTER — Encounter: Payer: Self-pay | Admitting: Internal Medicine

## 2023-10-31 ENCOUNTER — Telehealth: Payer: Self-pay | Admitting: Neurology

## 2023-10-31 NOTE — Telephone Encounter (Signed)
 Pt passed away 22-Aug-2023.
